# Patient Record
Sex: Male | Born: 1960 | Race: Black or African American | Hispanic: No | Smoking: Current some day smoker
Health system: Southern US, Community
[De-identification: ages and names within clinical notes are randomized; demographics above are authoritative.]

## PROBLEM LIST (undated history)

## (undated) DIAGNOSIS — F419 Anxiety disorder, unspecified: Secondary | ICD-10-CM

## (undated) DIAGNOSIS — R51 Headache: Secondary | ICD-10-CM

## (undated) DIAGNOSIS — R569 Unspecified convulsions: Secondary | ICD-10-CM

## (undated) DIAGNOSIS — I10 Essential (primary) hypertension: Secondary | ICD-10-CM

## (undated) DIAGNOSIS — R7303 Prediabetes: Secondary | ICD-10-CM

## (undated) DIAGNOSIS — K219 Gastro-esophageal reflux disease without esophagitis: Secondary | ICD-10-CM

## (undated) DIAGNOSIS — R42 Dizziness and giddiness: Secondary | ICD-10-CM

## (undated) DIAGNOSIS — D329 Benign neoplasm of meninges, unspecified: Secondary | ICD-10-CM

## (undated) DIAGNOSIS — I639 Cerebral infarction, unspecified: Secondary | ICD-10-CM

## (undated) DIAGNOSIS — I700 Atherosclerosis of aorta: Secondary | ICD-10-CM

## (undated) DIAGNOSIS — N400 Enlarged prostate without lower urinary tract symptoms: Secondary | ICD-10-CM

## (undated) DIAGNOSIS — E119 Type 2 diabetes mellitus without complications: Secondary | ICD-10-CM

## (undated) DIAGNOSIS — D496 Neoplasm of unspecified behavior of brain: Secondary | ICD-10-CM

## (undated) DIAGNOSIS — E7800 Pure hypercholesterolemia, unspecified: Secondary | ICD-10-CM

## (undated) DIAGNOSIS — M75100 Unspecified rotator cuff tear or rupture of unspecified shoulder, not specified as traumatic: Secondary | ICD-10-CM

## (undated) HISTORY — PX: ESOPHAGOGASTRODUODENOSCOPY: SHX1529

## (undated) HISTORY — PX: OTHER SURGICAL HISTORY: SHX169

## (undated) HISTORY — PX: CATARACT EXTRACTION: SUR2

---
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
## 2002-08-25 ENCOUNTER — Emergency Department (HOSPITAL_COMMUNITY): Admission: EM | Admit: 2002-08-25 | Discharge: 2002-08-25 | Payer: Self-pay

## 2002-09-11 ENCOUNTER — Emergency Department (HOSPITAL_COMMUNITY): Admission: EM | Admit: 2002-09-11 | Discharge: 2002-09-11 | Payer: Self-pay | Admitting: Emergency Medicine

## 2002-12-17 ENCOUNTER — Emergency Department (HOSPITAL_COMMUNITY): Admission: EM | Admit: 2002-12-17 | Discharge: 2002-12-18 | Payer: Self-pay | Admitting: Emergency Medicine

## 2005-08-15 ENCOUNTER — Inpatient Hospital Stay (HOSPITAL_COMMUNITY): Admission: RE | Admit: 2005-08-15 | Discharge: 2005-08-17 | Payer: Self-pay | Admitting: Psychiatry

## 2005-08-16 ENCOUNTER — Ambulatory Visit: Payer: Self-pay | Admitting: Psychiatry

## 2005-11-03 ENCOUNTER — Emergency Department (HOSPITAL_COMMUNITY): Admission: EM | Admit: 2005-11-03 | Discharge: 2005-11-03 | Payer: Self-pay | Admitting: Emergency Medicine

## 2005-11-30 ENCOUNTER — Emergency Department (HOSPITAL_COMMUNITY): Admission: EM | Admit: 2005-11-30 | Discharge: 2005-11-30 | Payer: Self-pay | Admitting: Emergency Medicine

## 2006-06-07 ENCOUNTER — Emergency Department: Payer: Self-pay | Admitting: Emergency Medicine

## 2006-06-20 ENCOUNTER — Emergency Department: Payer: Self-pay | Admitting: Emergency Medicine

## 2007-04-24 ENCOUNTER — Emergency Department: Payer: Self-pay | Admitting: Emergency Medicine

## 2007-09-04 ENCOUNTER — Emergency Department: Payer: Self-pay | Admitting: Emergency Medicine

## 2007-11-23 ENCOUNTER — Emergency Department: Payer: Self-pay | Admitting: Emergency Medicine

## 2008-02-06 ENCOUNTER — Emergency Department: Payer: Self-pay | Admitting: Emergency Medicine

## 2008-02-14 ENCOUNTER — Emergency Department: Payer: Self-pay

## 2008-03-30 ENCOUNTER — Emergency Department: Payer: Self-pay | Admitting: Unknown Physician Specialty

## 2012-09-21 ENCOUNTER — Emergency Department (HOSPITAL_COMMUNITY): Payer: 59

## 2012-09-21 ENCOUNTER — Emergency Department (HOSPITAL_COMMUNITY)
Admission: EM | Admit: 2012-09-21 | Discharge: 2012-09-21 | Disposition: A | Discharge status: Home / Self Care | Payer: 59 | Attending: Emergency Medicine | Admitting: Emergency Medicine

## 2012-09-21 ENCOUNTER — Encounter (HOSPITAL_COMMUNITY): Payer: Self-pay | Admitting: *Deleted

## 2012-09-21 DIAGNOSIS — Z79899 Other long term (current) drug therapy: Secondary | ICD-10-CM | POA: Insufficient documentation

## 2012-09-21 DIAGNOSIS — D320 Benign neoplasm of cerebral meninges: Secondary | ICD-10-CM | POA: Insufficient documentation

## 2012-09-21 DIAGNOSIS — F17200 Nicotine dependence, unspecified, uncomplicated: Secondary | ICD-10-CM | POA: Insufficient documentation

## 2012-09-21 DIAGNOSIS — H5710 Ocular pain, unspecified eye: Secondary | ICD-10-CM | POA: Insufficient documentation

## 2012-09-21 DIAGNOSIS — D496 Neoplasm of unspecified behavior of brain: Secondary | ICD-10-CM

## 2012-09-21 DIAGNOSIS — Z7982 Long term (current) use of aspirin: Secondary | ICD-10-CM | POA: Insufficient documentation

## 2012-09-21 DIAGNOSIS — Z9849 Cataract extraction status, unspecified eye: Secondary | ICD-10-CM | POA: Insufficient documentation

## 2012-09-21 DIAGNOSIS — R51 Headache: Secondary | ICD-10-CM | POA: Insufficient documentation

## 2012-09-21 MED ORDER — METOCLOPRAMIDE HCL 5 MG/ML IJ SOLN
10.0000 mg | Freq: Once | INTRAMUSCULAR | Status: AC
  Administered 2012-09-21: 10 mg via INTRAMUSCULAR
  Filled 2012-09-21: qty 2

## 2012-09-21 MED ORDER — GADOBENATE DIMEGLUMINE 529 MG/ML IV SOLN
20.0000 mL | Freq: Once | INTRAVENOUS | Status: AC | PRN
  Administered 2012-09-21: 20 mL via INTRAVENOUS

## 2012-09-21 MED ORDER — METOCLOPRAMIDE HCL 10 MG PO TABS: 10.0000 mg | ORAL_TABLET | Freq: Three times a day (TID) | ORAL | Status: DC | PRN

## 2012-09-21 MED ORDER — NAPROXEN 500 MG PO TABS: 500.0000 mg | ORAL_TABLET | Freq: Two times a day (BID) | ORAL | Status: DC

## 2012-09-21 MED ORDER — DIPHENHYDRAMINE HCL 25 MG PO CAPS
25.0000 mg | ORAL_CAPSULE | Freq: Once | ORAL | Status: AC
  Administered 2012-09-21: 25 mg via ORAL
  Filled 2012-09-21: qty 1

## 2012-09-21 MED ORDER — KETOROLAC TROMETHAMINE 60 MG/2ML IM SOLN
60.0000 mg | Freq: Once | INTRAMUSCULAR | Status: AC
  Administered 2012-09-21: 60 mg via INTRAMUSCULAR
  Filled 2012-09-21: qty 2

## 2012-09-21 NOTE — ED Notes (Signed)
Pt to ED c/o numbness around R eye orbit and R temple x 1 month .  Numbness usu lasts an hour and during that time he will experience intermittent stabbing pain to R eye.  Pt has hx of cataract removal to R eye and had an eye exam yesterday at which he was told nothing was wrong.  Pt states numbness is increasing.

## 2012-09-21 NOTE — ED Provider Notes (Signed)
History     CSN: [id number]  Arrival date & time 09/21/12  1214   First MD Initiated Contact with Patient 09/21/12 1235      Chief Complaint  Patient presents with  . Eye Pain  . Headache    (Consider location/radiation/quality/duration/timing/severity/associated sxs/prior treatment) HPI Comments: 52 year old male who has no prior medical history presents with right eye pain over the last month. He states that this pain has been present intermittently until approximately one month ago, since that time has been relatively persistent, nothing seems to make it better or worse including bright lights, loud sounds, position and it is not associated with any weakness, difficulty speaking, change in vision, balance problems, fevers, chills, stiff neck. He does have occasional numbness to the temple on the right side. Yesterday he was seen at his ophthalmologist office and had an eye exam thinking that the pain was coming from his eye. 2 years ago he had a cataract with a lens replacement, he was given a clear from his ophthalmologist yesterday stating that there was no problems with his eye. The patient describes the pain as a sharp and stabbing lancinating pain that comes from behind his right eye, associated with some redness of the eye and tearing of the eye that occurs occasionally. He has tried ibuprofen which helps his pain temporarily but then it comes back. He denies any changes in his vision.  Patient is a 52 y.o. male presenting with eye pain and headaches. The history is provided by the patient and a friend.  Eye Pain Associated symptoms include headaches.  Headache Associated symptoms: eye pain     History reviewed. No pertinent past medical history.  Past Surgical History  Procedure Laterality Date  . Cataract extraction      No family history on file.  History  Substance Use Topics  . Smoking status: Current Some Day Smoker    Types: Cigarettes  . Smokeless tobacco: Not on  file  . Alcohol Use: Yes     Comment: occasionally      Review of Systems  Eyes: Positive for pain.  Neurological: Positive for headaches.  All other systems reviewed and are negative.    Allergies  Review of patient's allergies indicates no known allergies.  Home Medications   Current Outpatient Rx  Name  Route  Sig  Dispense  Refill  . aspirin-acetaminophen-caffeine (EXCEDRIN MIGRAINE) 250-250-65 MG per tablet   Oral   Take 1 tablet by mouth every 6 (six) hours as needed (for headache).         Marland Kitchen ibuprofen (ADVIL,MOTRIN) 200 MG tablet   Oral   Take 200 mg by mouth every 6 (six) hours as needed for headache.         . polyvinyl alcohol (LIQUIFILM TEARS) 1.4 % ophthalmic solution   Right Eye   Place 1 drop into the right eye as needed (for redness).         . metoCLOPramide (REGLAN) 10 MG tablet   Oral   Take 1 tablet (10 mg total) by mouth 3 (three) times daily as needed (headache / nausea).   20 tablet   0   . naproxen (NAPROSYN) 500 MG tablet   Oral   Take 1 tablet (500 mg total) by mouth 2 (two) times daily with a meal.   30 tablet   0     BP 117/72  Pulse 55  Temp(Src) 99.1 F (37.3 C) (Oral)  Resp 16  SpO2 98%  Physical  Exam  Nursing note and vitals reviewed. Constitutional: He appears well-developed and well-nourished. No distress.  HENT:  Head: Normocephalic and atraumatic.  Mouth/Throat: Oropharynx is clear and moist. No oropharyngeal exudate.  No tenderness over the sinuses, nasal passages are clear, oropharynx is clear  Eyes: Conjunctivae and EOM are normal. Pupils are equal, round, and reactive to light. Right eye exhibits no discharge. Left eye exhibits no discharge. No scleral icterus.  Object is seen bilaterally, clear conjunctiva, normal appearing pupils and cornea grossly  Neck: Normal range of motion. Neck supple. No JVD present. No thyromegaly present.  Cardiovascular: Normal rate, regular rhythm, normal heart sounds and intact  distal pulses.  Exam reveals no gallop and no friction rub.   No murmur heard. Pulmonary/Chest: Effort normal and breath sounds normal. No respiratory distress. He has no wheezes. He has no rales.  Abdominal: Soft. Bowel sounds are normal. He exhibits no distension and no mass. There is no tenderness.  Musculoskeletal: Normal range of motion. He exhibits no edema and no tenderness.  Lymphadenopathy:    He has no cervical adenopathy.  Neurological: He is alert. Coordination normal.  The patient has normal speech, normal memory, normal coordination of all 4 extremities without any limb ataxia. The patient has normal strength of all 4 extremities at the major muscle groups including shoulders, biceps, triceps, forearm, grips, quadriceps, hamstrings, calves. Normal sensation to light touch and pinprick of all 4 extremities and the trunk. No truncal ataxia, normal gait, cranial nerves III through XII are intact.  Normal peripheral visual fields. Normal extraocular movements. Normal pupillary exam. No pronator drift, normal reflexes at the brachial radialis, biceps and patellar tendons. Normal position sense, normal temperature sensation to cold and warm  Skin: Skin is warm and dry. No rash noted. No erythema.  Psychiatric: He has a normal mood and affect. His behavior is normal.    ED Course  Procedures (including critical care time)  Labs Reviewed - No data to display Ct Head Wo Contrast  09/21/2012  *RADIOLOGY REPORT*  Clinical Data: Severe headache.  Pain behind the right eye.  CT HEAD WITHOUT CONTRAST  Technique:  Contiguous axial images were obtained from the base of the skull through the vertex without contrast.  Comparison: No priors.  Findings: In the cerebral vertex just to the right of the falx cerebri in the more adjacent to the posterior right frontal cortex there is a mass-like area where there is loss of the normal cortical sulcation and subtle right to left shift of the falx. This region  appears slightly hyperdense, although not as hyperdense as would be seen with hemorrhage.  No definite low attenuation in the adjacent brain parenchyma to suggest edema.  The appearance is highly suspicious for a mass, and given the location, this is favored to represent a meningioma, however, whether or not this lesion is intra or extra-axial is uncertain on these noncontrast CT images.  The remainder the brain parenchyma is otherwise unremarkable in appearance.  Specifically, no definite signs of acute intracranial hemorrhage or definite regions to suggest acute/subacute cerebral ischemia.  No hydrocephalous. No acute displaced skull fractures are identified.  Visualized paranasal sinuses and mastoids are well pneumatized.  IMPRESSION: 1.  Findings, as above, suspicious for a mass in the vertex immediately to the right of the falx cerebri.  This is favored to be an extra-axial lesion, likely a meningioma, however, this does warrant further evaluation with brain MRI with without gadolinium.  These results were called by telephone  on 09/21/2012 at 01:42 p.m. to Dr. Hyacinth Meeker, who verbally acknowledged these results.   Original Report Authenticated By: Trudie Reed, M.D.    Mr Laqueta Jean Wo Contrast  09/21/2012  *RADIOLOGY REPORT*  Clinical Data: Eye pain and headache for 3 weeks.  MRI HEAD WITHOUT AND WITH CONTRAST  Technique:  Multiplanar, multiecho pulse sequences of the brain and surrounding structures were obtained according to standard protocol without and with intravenous contrast  Contrast: 20mL MULTIHANCE GADOBENATE DIMEGLUMINE 529 MG/ML IV SOLN  Comparison: 09/21/2012 CT.  Findings: No acute infarct.  No intracranial hemorrhage.  Right parafalcine posterior medial right frontal - parietal lobe extra-axial appearing 3.2 x 2.3 x 2.9 cm mass suspicious for a meningioma.  Series 8 image 9 raises possibility of extension through the falx with slight impression upon the superior sagittal sinus which remains  patent. Mass is at the level of the medial aspect of the right motor strip and sensory strip.  Compression of adjacent gyri but without significant vasogenic edema.  No other intracranial enhancing lesion.  Small pituitary gland.  Mild degenerative changes of the cervical spine.  Major intracranial vascular structures appear patent with small vertebral arteries and basilar artery.  Paranasal sinus mild mucosal thickening.  Artifact extends from dental work.  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      1. Brain tumor   2. Headache       MDM  The patient presents with signs and symptoms consistent with a cluster headache. At this time he does not have any objective numbness around his eye, he has normal extraocular movements, normal orbit and a normal neurologic exam. Due to his age I think it's important to further evaluate for other sources of a headache since he has not had a history of headaches in the past. CT scan pending, pain medication ordered, will reevaluate.   CT shows abnormal likely mass, MRI ordered and confirms meningioma - d/w Dr. Venetia Maxon who requests f/u on Monday - pt totally pain free after toradol and reglan, informed of results, stable for d/c.     Vida Roller, MD 09/21/12 [phone]

## 2012-09-27 ENCOUNTER — Other Ambulatory Visit: Payer: Self-pay | Admitting: Neurosurgery

## 2012-09-27 DIAGNOSIS — D320 Benign neoplasm of cerebral meninges: Secondary | ICD-10-CM

## 2012-10-01 ENCOUNTER — Ambulatory Visit
Admission: RE | Admit: 2012-10-01 | Discharge: 2012-10-01 | Disposition: A | Discharge status: Home / Self Care | Payer: 59 | Source: Ambulatory Visit | Attending: Neurosurgery | Admitting: Neurosurgery

## 2012-10-01 ENCOUNTER — Other Ambulatory Visit: Payer: 59

## 2012-10-01 DIAGNOSIS — D320 Benign neoplasm of cerebral meninges: Secondary | ICD-10-CM

## 2012-10-01 MED ORDER — IOHEXOL 300 MG/ML  SOLN
75.0000 mL | Freq: Once | INTRAMUSCULAR | Status: AC | PRN
  Administered 2012-10-01: 75 mL via INTRAVENOUS

## 2012-10-11 ENCOUNTER — Ambulatory Visit: Payer: Self-pay | Admitting: Adult Health

## 2012-10-22 ENCOUNTER — Encounter (HOSPITAL_COMMUNITY): Payer: Self-pay | Admitting: Pharmacy Technician

## 2012-10-29 NOTE — Pre-Procedure Instructions (Signed)
Joshua [name]  10/29/2012   Your procedure is scheduled on:  Tuesday, April 22nd   Report to [facility] at 5:30 AM.             Take [name] to the third (3rd) Floor  Call this number if you have problems the morning of surgery: [phone]   Remember:   Do not eat food or drink liquids after midnight Monday.   Take these medicines the morning of surgery with A SIP OF WATER: Reglan (if needed)   Do not wear jewelry.  Do not wear lotions, powders, or colognes. You may NOT wear deodorant.   Men may shave face and neck.   Do not bring valuables to the hospital.  Contacts, dentures or bridgework may not be worn into surgery.   Leave suitcase in the car. After surgery it may be brought to your room.  For patients admitted to the hospital, checkout time is 11:00 AM the day of discharge.    Name and phone number of your driver:    Special Instructions: Shower using CHG 2 nights before surgery and the night before surgery.  If you shower the day of surgery use CHG.  Use special wash - you have one bottle of CHG for all showers.  You should use approximately 1/3 of the bottle for each shower.   Please read over the following fact sheets that you were given: Pain Booklet, Blood Transfusion Information, MRSA Information and Surgical Site Infection Prevention

## 2012-11-01 ENCOUNTER — Encounter (HOSPITAL_COMMUNITY)
Admission: RE | Admit: 2012-11-01 | Discharge: 2012-11-01 | Disposition: A | Discharge status: Home / Self Care | Payer: 59 | Source: Ambulatory Visit | Attending: Neurosurgery | Admitting: Neurosurgery

## 2012-11-01 ENCOUNTER — Encounter (HOSPITAL_COMMUNITY): Payer: Self-pay

## 2012-11-01 DIAGNOSIS — Z01812 Encounter for preprocedural laboratory examination: Secondary | ICD-10-CM | POA: Insufficient documentation

## 2012-11-01 HISTORY — DX: Headache: R51

## 2012-11-01 HISTORY — DX: Dizziness and giddiness: R42

## 2012-11-01 HISTORY — DX: Benign neoplasm of meninges, unspecified: D32.9

## 2012-11-01 HISTORY — DX: Benign prostatic hyperplasia without lower urinary tract symptoms: N40.0

## 2012-11-01 LAB — CBC
HCT: 42.9 % (ref 39.0–52.0)
Hemoglobin: 15.3 g/dL (ref 13.0–17.0)
MCH: 31.5 pg (ref 26.0–34.0)
MCHC: 35.7 g/dL (ref 30.0–36.0)
MCV: 88.3 fL (ref 78.0–100.0)
Platelets: 227 10*3/uL (ref 150–400)
RBC: 4.86 MIL/uL (ref 4.22–5.81)
RDW: 12.4 % (ref 11.5–15.5)
WBC: 7.4 10*3/uL (ref 4.0–10.5)

## 2012-11-01 LAB — TYPE AND SCREEN
ABO/RH(D): A POS
Antibody Screen: NEGATIVE

## 2012-11-01 LAB — SURGICAL PCR SCREEN
MRSA, PCR: NEGATIVE
Staphylococcus aureus: NEGATIVE

## 2012-11-01 LAB — ABO/RH: ABO/RH(D): A POS

## 2012-11-01 NOTE — Progress Notes (Signed)
Per [facility] no Stress Test has been done

## 2012-11-01 NOTE — Pre-Procedure Instructions (Signed)
Joshua [name]  11/01/2012   Your procedure is scheduled on:  Tues, April 22 @ 7:30 AM  Report to [facility] at 5:30 AM.  Call this number if you have problems the morning of surgery: [phone]   Remember:   Do not eat food or drink liquids after midnight.   Take these medicines the morning of surgery with A SIP OF WATER: Metoclopramide(Reglan-if needed)               Stop taking your Ibuprofen and Aleve.No Goody's,BC's,Fish Oil,or any Herbal Medications   Do not wear jewelry  Do not wear lotions, powders, or colognes. You may wear deodorant.  Men may shave face and neck.  Do not bring valuables to the hospital.  Contacts, dentures or bridgework may not be worn into surgery.  Leave suitcase in the car. After surgery it may be brought to your room.  For patients admitted to the hospital, checkout time is 11:00 AM the day of  discharge.   Patients discharged the day of surgery will not be allowed to drive  home.    Special Instructions: Shower using CHG 2 nights before surgery and the night before surgery.  If you shower the day of surgery use CHG.  Use special wash - you have one bottle of CHG for all showers.  You should use approximately 1/3 of the bottle for each shower.   Please read over the following fact sheets that you were given: Pain Booklet, Coughing and Deep Breathing, Blood Transfusion Information, MRSA Information and Surgical Site Infection Prevention

## 2012-11-01 NOTE — Progress Notes (Addendum)
Pt doesn't have a cardiologist  Stress test done about 50yrs ago-done @ [facility] in [place]-was just done as a physical no problems and stress test was normal-requested  Denies ever having an echo/heart cath  Pt doesn't have a medical md  Denies ekg or cxr in past yr

## 2012-11-05 MED ORDER — CEFAZOLIN SODIUM-DEXTROSE 2-3 GM-% IV SOLR: 2.0000 g | INTRAVENOUS | Status: DC

## 2012-11-05 MED ORDER — CEFAZOLIN SODIUM-DEXTROSE 2-3 GM-% IV SOLR
2.0000 g | INTRAVENOUS | Status: AC
  Administered 2012-11-06: 2 g via INTRAVENOUS
  Filled 2012-11-05: qty 50

## 2012-11-06 ENCOUNTER — Encounter (HOSPITAL_COMMUNITY): Payer: Self-pay | Admitting: *Deleted

## 2012-11-06 ENCOUNTER — Inpatient Hospital Stay (HOSPITAL_COMMUNITY): Payer: 59 | Admitting: Anesthesiology

## 2012-11-06 ENCOUNTER — Encounter (HOSPITAL_COMMUNITY): Payer: Self-pay | Admitting: Anesthesiology

## 2012-11-06 ENCOUNTER — Inpatient Hospital Stay (HOSPITAL_COMMUNITY)
Admission: RE | Admit: 2012-11-06 | Discharge: 2012-11-08 | DRG: 027 | Disposition: A | Discharge status: Home / Self Care | Payer: 59 | Source: Ambulatory Visit | Attending: Neurosurgery | Admitting: Neurosurgery

## 2012-11-06 ENCOUNTER — Encounter (HOSPITAL_COMMUNITY)
Admission: RE | Disposition: A | Discharge status: Home / Self Care | Payer: Self-pay | Source: Ambulatory Visit | Attending: Neurosurgery

## 2012-11-06 DIAGNOSIS — G44009 Cluster headache syndrome, unspecified, not intractable: Secondary | ICD-10-CM | POA: Diagnosis present

## 2012-11-06 DIAGNOSIS — D320 Benign neoplasm of cerebral meninges: Principal | ICD-10-CM | POA: Diagnosis present

## 2012-11-06 DIAGNOSIS — F17200 Nicotine dependence, unspecified, uncomplicated: Secondary | ICD-10-CM | POA: Diagnosis present

## 2012-11-06 HISTORY — PX: CRANIOTOMY: SHX93

## 2012-11-06 SURGERY — CRANIOTOMY TUMOR EXCISION
Anesthesia: General | Laterality: Right | Wound class: Clean

## 2012-11-06 MED ORDER — LABETALOL HCL 5 MG/ML IV SOLN: 10.0000 mg | INTRAVENOUS | Status: DC | PRN

## 2012-11-06 MED ORDER — LACTATED RINGERS IV SOLN
INTRAVENOUS | Status: DC | PRN
  Administered 2012-11-06: 07:00:00 via INTRAVENOUS

## 2012-11-06 MED ORDER — MORPHINE SULFATE 2 MG/ML IJ SOLN
1.0000 mg | INTRAMUSCULAR | Status: DC | PRN
  Administered 2012-11-06 (×2): 2 mg via INTRAVENOUS
  Filled 2012-11-06 (×2): qty 1

## 2012-11-06 MED ORDER — METOCLOPRAMIDE HCL 10 MG PO TABS
10.0000 mg | ORAL_TABLET | Freq: Three times a day (TID) | ORAL | Status: DC | PRN
Start: 2012-11-06 — End: 2012-11-08
  Filled 2012-11-06: qty 1

## 2012-11-06 MED ORDER — BUPIVACAINE HCL (PF) 0.5 % IJ SOLN
INTRAMUSCULAR | Status: DC | PRN
  Administered 2012-11-06: 5.5 mL

## 2012-11-06 MED ORDER — DEXAMETHASONE SODIUM PHOSPHATE 10 MG/ML IJ SOLN
6.0000 mg | Freq: Four times a day (QID) | INTRAMUSCULAR | Status: AC
  Administered 2012-11-06 – 2012-11-07 (×4): 6 mg via INTRAVENOUS
  Filled 2012-11-06 (×2): qty 1

## 2012-11-06 MED ORDER — CEFAZOLIN SODIUM-DEXTROSE 2-3 GM-% IV SOLR: 2.0000 g | Freq: Three times a day (TID) | INTRAVENOUS | Status: DC

## 2012-11-06 MED ORDER — BACITRACIN ZINC 500 UNIT/GM EX OINT
TOPICAL_OINTMENT | CUTANEOUS | Status: DC | PRN
  Administered 2012-11-06: 1 via TOPICAL

## 2012-11-06 MED ORDER — LIDOCAINE HCL (CARDIAC) 20 MG/ML IV SOLN
INTRAVENOUS | Status: DC | PRN
  Administered 2012-11-06: 50 mg via INTRAVENOUS

## 2012-11-06 MED ORDER — DOCUSATE SODIUM 100 MG PO CAPS
100.0000 mg | ORAL_CAPSULE | Freq: Two times a day (BID) | ORAL | Status: DC
  Administered 2012-11-06 – 2012-11-08 (×4): 100 mg via ORAL
  Filled 2012-11-06 (×5): qty 1

## 2012-11-06 MED ORDER — CEFAZOLIN SODIUM 1-5 GM-% IV SOLN
1.0000 g | Freq: Three times a day (TID) | INTRAVENOUS | Status: AC
  Administered 2012-11-06 (×2): 1 g via INTRAVENOUS
  Filled 2012-11-06 (×2): qty 50

## 2012-11-06 MED ORDER — LIDOCAINE-EPINEPHRINE 1 %-1:100000 IJ SOLN
INTRAMUSCULAR | Status: DC | PRN
  Administered 2012-11-06: 5.5 mL via INTRADERMAL

## 2012-11-06 MED ORDER — DEXAMETHASONE SODIUM PHOSPHATE 4 MG/ML IJ SOLN
4.0000 mg | Freq: Four times a day (QID) | INTRAMUSCULAR | Status: AC
  Administered 2012-11-07 – 2012-11-08 (×4): 4 mg via INTRAVENOUS
  Filled 2012-11-06 (×3): qty 1
  Filled 2012-11-06: qty 2
  Filled 2012-11-06 (×4): qty 1

## 2012-11-06 MED ORDER — ACETAMINOPHEN 650 MG RE SUPP: 650.0000 mg | RECTAL | Status: DC | PRN

## 2012-11-06 MED ORDER — HYDROCODONE-ACETAMINOPHEN 5-325 MG PO TABS
1.0000 | ORAL_TABLET | ORAL | Status: DC | PRN
  Administered 2012-11-06 – 2012-11-08 (×6): 1 via ORAL
  Filled 2012-11-06 (×6): qty 1

## 2012-11-06 MED ORDER — GLYCOPYRROLATE 0.2 MG/ML IJ SOLN
INTRAMUSCULAR | Status: DC | PRN
  Administered 2012-11-06 (×2): 0.4 mg via INTRAVENOUS

## 2012-11-06 MED ORDER — HYDROMORPHONE HCL PF 1 MG/ML IJ SOLN
INTRAMUSCULAR | Status: AC
  Administered 2012-11-06: 0.5 mg via INTRAVENOUS
  Filled 2012-11-06: qty 1

## 2012-11-06 MED ORDER — ACETAMINOPHEN 325 MG PO TABS: 650.0000 mg | ORAL_TABLET | ORAL | Status: DC | PRN

## 2012-11-06 MED ORDER — ONDANSETRON HCL 4 MG/2ML IJ SOLN: 4.0000 mg | INTRAMUSCULAR | Status: DC | PRN

## 2012-11-06 MED ORDER — PROPOFOL 10 MG/ML IV BOLUS
INTRAVENOUS | Status: DC | PRN
  Administered 2012-11-06: 200 mg via INTRAVENOUS
  Administered 2012-11-06: 20 mg via INTRAVENOUS
  Administered 2012-11-06: 50 mg via INTRAVENOUS
  Administered 2012-11-06: 30 mg via INTRAVENOUS
  Administered 2012-11-06: 50 mg via INTRAVENOUS

## 2012-11-06 MED ORDER — SENNA 8.6 MG PO TABS
1.0000 | ORAL_TABLET | Freq: Two times a day (BID) | ORAL | Status: DC
  Administered 2012-11-06 – 2012-11-08 (×4): 8.6 mg via ORAL
  Filled 2012-11-06 (×6): qty 1

## 2012-11-06 MED ORDER — ONDANSETRON HCL 4 MG/2ML IJ SOLN
INTRAMUSCULAR | Status: DC | PRN
  Administered 2012-11-06: 4 mg via INTRAVENOUS

## 2012-11-06 MED ORDER — PROMETHAZINE HCL 25 MG PO TABS: 12.5000 mg | ORAL_TABLET | ORAL | Status: DC | PRN

## 2012-11-06 MED ORDER — MICROFIBRILLAR COLL HEMOSTAT EX PADS
MEDICATED_PAD | CUTANEOUS | Status: DC | PRN
  Administered 2012-11-06: 1 via TOPICAL

## 2012-11-06 MED ORDER — PHENYLEPHRINE HCL 10 MG/ML IJ SOLN
INTRAMUSCULAR | Status: DC | PRN
  Administered 2012-11-06: 40 ug via INTRAVENOUS
  Administered 2012-11-06: 20 ug via INTRAVENOUS

## 2012-11-06 MED ORDER — ROCURONIUM BROMIDE 100 MG/10ML IV SOLN
INTRAVENOUS | Status: DC | PRN
  Administered 2012-11-06 (×2): 30 mg via INTRAVENOUS
  Administered 2012-11-06: 20 mg via INTRAVENOUS

## 2012-11-06 MED ORDER — DEXAMETHASONE SODIUM PHOSPHATE 10 MG/ML IJ SOLN
INTRAMUSCULAR | Status: DC | PRN
  Administered 2012-11-06: 10 mg via INTRAVENOUS

## 2012-11-06 MED ORDER — LACTATED RINGERS IV SOLN
INTRAVENOUS | Status: DC | PRN
  Administered 2012-11-06 (×2): via INTRAVENOUS

## 2012-11-06 MED ORDER — POLYETHYLENE GLYCOL 3350 17 G PO PACK
17.0000 g | PACK | Freq: Every day | ORAL | Status: DC | PRN
  Filled 2012-11-06: qty 1

## 2012-11-06 MED ORDER — BISACODYL 10 MG RE SUPP: 10.0000 mg | Freq: Every day | RECTAL | Status: DC | PRN

## 2012-11-06 MED ORDER — MORPHINE SULFATE 2 MG/ML IJ SOLN
2.0000 mg | INTRAMUSCULAR | Status: DC | PRN
  Administered 2012-11-06: 4 mg via INTRAVENOUS
  Administered 2012-11-06: 2 mg via INTRAVENOUS
  Filled 2012-11-06: qty 1
  Filled 2012-11-06: qty 2

## 2012-11-06 MED ORDER — DEXTROSE 5 % IV SOLN
INTRAVENOUS | Status: DC | PRN
  Administered 2012-11-06: 08:00:00 via INTRAVENOUS

## 2012-11-06 MED ORDER — WHITE PETROLATUM GEL
Status: AC
  Administered 2012-11-06: 1
  Filled 2012-11-06: qty 5

## 2012-11-06 MED ORDER — HEMOSTATIC AGENTS (NO CHARGE) OPTIME
TOPICAL | Status: DC | PRN
  Administered 2012-11-06: 1 via TOPICAL

## 2012-11-06 MED ORDER — DEXAMETHASONE SODIUM PHOSPHATE 4 MG/ML IJ SOLN
4.0000 mg | Freq: Three times a day (TID) | INTRAMUSCULAR | Status: DC
  Filled 2012-11-06 (×3): qty 1

## 2012-11-06 MED ORDER — SODIUM CHLORIDE 0.9 % IV SOLN
500.0000 mg | Freq: Two times a day (BID) | INTRAVENOUS | Status: DC
  Administered 2012-11-06 – 2012-11-07 (×3): 500 mg via INTRAVENOUS
  Filled 2012-11-06 (×5): qty 5

## 2012-11-06 MED ORDER — ONDANSETRON HCL 4 MG PO TABS: 4.0000 mg | ORAL_TABLET | ORAL | Status: DC | PRN

## 2012-11-06 MED ORDER — HYDROMORPHONE HCL PF 1 MG/ML IJ SOLN
0.2500 mg | INTRAMUSCULAR | Status: DC | PRN
  Administered 2012-11-06 (×2): 0.5 mg via INTRAVENOUS

## 2012-11-06 MED ORDER — FENTANYL CITRATE 0.05 MG/ML IJ SOLN
INTRAMUSCULAR | Status: DC | PRN
  Administered 2012-11-06: 100 ug via INTRAVENOUS
  Administered 2012-11-06 (×2): 50 ug via INTRAVENOUS
  Administered 2012-11-06: 100 ug via INTRAVENOUS
  Administered 2012-11-06: 50 ug via INTRAVENOUS
  Administered 2012-11-06: 100 ug via INTRAVENOUS
  Administered 2012-11-06: 50 ug via INTRAVENOUS

## 2012-11-06 MED ORDER — NEOSTIGMINE METHYLSULFATE 1 MG/ML IJ SOLN
INTRAMUSCULAR | Status: DC | PRN
  Administered 2012-11-06: 2 mg via INTRAVENOUS
  Administered 2012-11-06: 3 mg via INTRAVENOUS

## 2012-11-06 MED ORDER — SODIUM CHLORIDE 0.9 % IR SOLN
Status: DC | PRN
  Administered 2012-11-06 (×2): 1000 mL

## 2012-11-06 MED ORDER — PANTOPRAZOLE SODIUM 40 MG IV SOLR
40.0000 mg | Freq: Every day | INTRAVENOUS | Status: DC
  Administered 2012-11-06: 40 mg via INTRAVENOUS
  Filled 2012-11-06 (×2): qty 40

## 2012-11-06 MED ORDER — POTASSIUM CHLORIDE IN NACL 20-0.9 MEQ/L-% IV SOLN
INTRAVENOUS | Status: DC
  Administered 2012-11-06 – 2012-11-07 (×2): via INTRAVENOUS
  Filled 2012-11-06 (×4): qty 1000

## 2012-11-06 MED ORDER — THROMBIN 20000 UNITS EX KIT
PACK | CUTANEOUS | Status: DC | PRN
  Administered 2012-11-06: 20000 [IU] via TOPICAL

## 2012-11-06 MED ORDER — MIDAZOLAM HCL 5 MG/5ML IJ SOLN
INTRAMUSCULAR | Status: DC | PRN
  Administered 2012-11-06: 1 mg via INTRAVENOUS

## 2012-11-06 MED ORDER — FLEET ENEMA 7-19 GM/118ML RE ENEM
1.0000 | ENEMA | Freq: Once | RECTAL | Status: AC | PRN
  Filled 2012-11-06: qty 1

## 2012-11-06 SURGICAL SUPPLY — 86 items
BANDAGE GAUZE 4  KLING STR (GAUZE/BANDAGES/DRESSINGS) IMPLANT
BIT DRILL WIRE PASS 1.3MM (BIT) IMPLANT
BLADE SURG ROTATE 9660 (MISCELLANEOUS) IMPLANT
BRUSH SCRUB EZ 1% IODOPHOR (MISCELLANEOUS) IMPLANT
BRUSH SCRUB EZ PLAIN DRY (MISCELLANEOUS) ×2 IMPLANT
BUR ACORN 6.0 PRECISION (BURR) ×2 IMPLANT
BUR ADDG 1.1 (BURR) IMPLANT
BUR ROUTER D-58 CRANI (BURR) ×2 IMPLANT
CANISTER SUCTION 2500CC (MISCELLANEOUS) ×2 IMPLANT
CLIP TI MEDIUM 6 (CLIP) IMPLANT
CLOTH BEACON ORANGE TIMEOUT ST (SAFETY) ×2 IMPLANT
CONT SPEC 4OZ CLIKSEAL STRL BL (MISCELLANEOUS) ×6 IMPLANT
CORDS BIPOLAR (ELECTRODE) ×2 IMPLANT
COVER MAYO STAND STRL (DRAPES) IMPLANT
DRAIN SNY WOU 7FLT (WOUND CARE) IMPLANT
DRAPE MICROSCOPE LEICA (MISCELLANEOUS) ×2 IMPLANT
DRAPE NEUROLOGICAL W/INCISE (DRAPES) ×2 IMPLANT
DRAPE STERI IOBAN 125X83 (DRAPES) IMPLANT
DRAPE WARM FLUID 44X44 (DRAPE) ×2 IMPLANT
DRESSING TELFA 8X3 (GAUZE/BANDAGES/DRESSINGS) ×2 IMPLANT
DRILL WIRE PASS 1.3MM (BIT)
DRSG OPSITE 4X5.5 SM (GAUZE/BANDAGES/DRESSINGS) ×8 IMPLANT
DRSG TEGADERM 4X4.75 (GAUZE/BANDAGES/DRESSINGS) ×2 IMPLANT
DURAPREP 6ML APPLICATOR 50/CS (WOUND CARE) ×2 IMPLANT
ELECT REM PT RETURN 9FT ADLT (ELECTROSURGICAL) ×2
ELECTRODE REM PT RTRN 9FT ADLT (ELECTROSURGICAL) ×1 IMPLANT
EVACUATOR 1/8 PVC DRAIN (DRAIN) IMPLANT
EVACUATOR SILICONE 100CC (DRAIN) IMPLANT
FORCEPS BIPOLAR SPETZLER 8 1.0 (NEUROSURGERY SUPPLIES) ×2 IMPLANT
GAUZE SPONGE 4X4 16PLY XRAY LF (GAUZE/BANDAGES/DRESSINGS) IMPLANT
GLOVE BIO SURGEON STRL SZ8 (GLOVE) ×2 IMPLANT
GLOVE BIOGEL PI IND STRL 7.0 (GLOVE) ×1 IMPLANT
GLOVE BIOGEL PI IND STRL 7.5 (GLOVE) ×1 IMPLANT
GLOVE BIOGEL PI IND STRL 8 (GLOVE) ×1 IMPLANT
GLOVE BIOGEL PI IND STRL 8.5 (GLOVE) ×2 IMPLANT
GLOVE BIOGEL PI INDICATOR 7.0 (GLOVE) ×1
GLOVE BIOGEL PI INDICATOR 7.5 (GLOVE) ×1
GLOVE BIOGEL PI INDICATOR 8 (GLOVE) ×1
GLOVE BIOGEL PI INDICATOR 8.5 (GLOVE) ×2
GLOVE ECLIPSE 6.5 STRL STRAW (GLOVE) ×2 IMPLANT
GLOVE ECLIPSE 8.0 STRL XLNG CF (GLOVE) ×2 IMPLANT
GLOVE EXAM NITRILE LRG STRL (GLOVE) IMPLANT
GLOVE EXAM NITRILE MD LF STRL (GLOVE) ×4 IMPLANT
GLOVE EXAM NITRILE XL STR (GLOVE) IMPLANT
GLOVE EXAM NITRILE XS STR PU (GLOVE) IMPLANT
GLOVE SURG SS PI 7.0 STRL IVOR (GLOVE) ×8 IMPLANT
GOWN BRE IMP SLV AUR LG STRL (GOWN DISPOSABLE) IMPLANT
GOWN BRE IMP SLV AUR XL STRL (GOWN DISPOSABLE) ×6 IMPLANT
GOWN STRL REIN 2XL LVL4 (GOWN DISPOSABLE) ×2 IMPLANT
HEMOSTAT SURGICEL 2X14 (HEMOSTASIS) ×2 IMPLANT
HOOK DURA (MISCELLANEOUS) ×2 IMPLANT
KIT BASIN OR (CUSTOM PROCEDURE TRAY) ×2 IMPLANT
KIT ROOM TURNOVER OR (KITS) ×2 IMPLANT
MARKER SKIN DUAL TIP RULER LAB (MISCELLANEOUS) ×2 IMPLANT
NEEDLE HYPO 25X1 1.5 SAFETY (NEEDLE) ×2 IMPLANT
NS IRRIG 1000ML POUR BTL (IV SOLUTION) ×4 IMPLANT
PACK CRANIOTOMY (CUSTOM PROCEDURE TRAY) ×2 IMPLANT
PAD ARMBOARD 7.5X6 YLW CONV (MISCELLANEOUS) ×2 IMPLANT
PAD EYE OVAL STERILE LF (GAUZE/BANDAGES/DRESSINGS) IMPLANT
PATTIES SURGICAL .25X.25 (GAUZE/BANDAGES/DRESSINGS) IMPLANT
PATTIES SURGICAL .5 X.5 (GAUZE/BANDAGES/DRESSINGS) IMPLANT
PATTIES SURGICAL .5 X3 (DISPOSABLE) IMPLANT
PATTIES SURGICAL 1/4 X 3 (GAUZE/BANDAGES/DRESSINGS) IMPLANT
PATTIES SURGICAL 1X1 (DISPOSABLE) IMPLANT
PIN MAYFIELD SKULL DISP (PIN) ×2 IMPLANT
PLATE 1.5/0.5 13MM BURR HOLE (Plate) ×8 IMPLANT
RUBBERBAND STERILE (MISCELLANEOUS) ×4 IMPLANT
SCREW SELF DRILL HT 1.5/4MM (Screw) ×34 IMPLANT
SPECIMEN JAR SMALL (MISCELLANEOUS) IMPLANT
SPONGE GAUZE 4X4 12PLY (GAUZE/BANDAGES/DRESSINGS) IMPLANT
SPONGE NEURO XRAY DETECT 1X3 (DISPOSABLE) IMPLANT
SPONGE SURGIFOAM ABS GEL 100 (HEMOSTASIS) ×2 IMPLANT
STAPLER SKIN PROX WIDE 3.9 (STAPLE) ×4 IMPLANT
SUT ETHILON 3 0 FSL (SUTURE) IMPLANT
SUT NURALON 4 0 TR CR/8 (SUTURE) ×4 IMPLANT
SUT SILK 2 0 FS (SUTURE) IMPLANT
SUT VIC AB 2-0 CP2 18 (SUTURE) ×4 IMPLANT
SYR 20ML ECCENTRIC (SYRINGE) ×2 IMPLANT
SYR CONTROL 10ML LL (SYRINGE) ×2 IMPLANT
TIP SONASTAR STD MISONIX 1.9 (TRAY / TRAY PROCEDURE) IMPLANT
TOWEL OR 17X24 6PK STRL BLUE (TOWEL DISPOSABLE) ×2 IMPLANT
TOWEL OR 17X26 10 PK STRL BLUE (TOWEL DISPOSABLE) ×2 IMPLANT
TRAY FOLEY CATH 14FRSI W/METER (CATHETERS) ×2 IMPLANT
TUBE CONNECTING 12X1/4 (SUCTIONS) IMPLANT
UNDERPAD 30X30 INCONTINENT (UNDERPADS AND DIAPERS) IMPLANT
WATER STERILE IRR 1000ML POUR (IV SOLUTION) ×2 IMPLANT

## 2012-11-06 NOTE — Anesthesia Preprocedure Evaluation (Addendum)
Anesthesia Evaluation  Patient identified by MRN, date of birth, ID band Patient awake    Reviewed: Allergy & Precautions, H&P , NPO status , Patient's Chart, lab work & pertinent test results, reviewed documented beta blocker date and time   Airway Mallampati: I TM Distance: >3 FB Neck ROM: Full    Dental  (+) Teeth Intact, Missing, Partial Upper and Dental Advisory Given   Pulmonary Current Smoker,  breath sounds clear to auscultation        Cardiovascular Exercise Tolerance: Good negative cardio ROS  Rhythm:Regular Rate:Normal     Neuro/Psych  Headaches,    GI/Hepatic negative GI ROS, Neg liver ROS,   Endo/Other  negative endocrine ROS  Renal/GU negative Renal ROS     Musculoskeletal   Abdominal   Peds  Hematology   Anesthesia Other Findings   Reproductive/Obstetrics                         Anesthesia Physical Anesthesia Plan  ASA: III  Anesthesia Plan: General   Post-op Pain Management:    Induction: Intravenous  Airway Management Planned: Oral ETT  Additional Equipment:   Intra-op Plan:   Post-operative Plan: Possible Post-op intubation/ventilation  Informed Consent: I have reviewed the patients History and Physical, chart, labs and discussed the procedure including the risks, benefits and alternatives for the proposed anesthesia with the patient or authorized representative who has indicated his/her understanding and acceptance.   Dental advisory given  Plan Discussed with: CRNA, Anesthesiologist and Surgeon  Anesthesia Plan Comments:        Anesthesia Quick Evaluation

## 2012-11-06 NOTE — Interval H&P Note (Signed)
History and Physical Interval Note:  11/06/2012 6:29 AM  Joshua [name]  has presented today for surgery, with the diagnosis of Meningioma, Cluster headaches  The various methods of treatment have been discussed with the patient and family. After consideration of risks, benefits and other options for treatment, the patient has consented to  Procedure(s) with comments: CRANIOTOMY TUMOR EXCISION (Right) - Right Parasagittal craniotomy for meningioma/with Stealth as a surgical intervention .  The patient's history has been reviewed, patient examined, no change in status, stable for surgery.  I have reviewed the patient's chart and labs.  Questions were answered to the patient's satisfaction.     [name] D

## 2012-11-06 NOTE — H&P (Signed)
NEUROSURGICAL CONSULTATION   Joshua [name]   DOB:  October 27, 1960 #[id number]    September 25, 2012   HISTORY:     Joshua [name] is a 52 year old man who works as a Administrator, Civil Service at [employer] who comes in on referral through the emergency room at [facility] with right eye pain, which the emergency room physician, I believe, was correct in diagnosing as a cluster headache, as he describes tearing and retroorbital pain and describes this as quite intense and intermittent.  He previously saw an eye doctor who felt that his eye pain was that he had a significant cataract.  He had cataract extraction on the right, but this did not improve his eye pain.  The physician in the emergency room obtained an MRI of his brain at the time of his visit to the emergency room, which revealed an incidental right parasagittal meningioma of approximately 3 cm. in diameter with significant compression of the right motor strip, but without a lot of brain edema. The sagittal sinus appears to be patent.  This was based on a head CT which then led to the MRI scan. He denies any weakness on his left side. He does not occasional pain at the top of his head.  He is otherwise without significant complaints.    REVIEW OF SYSTEMS:   A detailed Review of Systems sheet was reviewed with the patient.  Pertinent positives include under eyes - he has cataracts, under neurologic - he notes facial weakness.  All other systems are negative; this includes Constitutional symptoms, Cardiovascular, Ears, nose, mouth, throat, Endocrine, Respiratory, Gastrointestinal, Genitourinary, Musculoskeletal, Integumentary & Breast, Psychiatric, Hematologic/Lymphatic, Allergic/Immunologic.    PAST MEDICAL HISTORY:      Current Medical Conditions:    As previously described above.      Prior Operations and Hospitalizations:   As previously described above.      Medications and Allergies:  His medications include Excedrin Migraine  250-250-65 mg. one every six hours as needed for headaches, Ibuprofen 200 mg. every six hours as needed for headache, Polyvinyl alcohol Liquifilm Tears in the right eye as needed for redness, Metoclopramide 10 mg. three times daily as needed for headache and nausea, Naprosyn 500 mg. twice daily as needed.      Height and Weight:     He is 5'10" tall, 185 lbs.    FAMILY HISTORY:    Both parents deceased, cause unspecified.    SOCIAL HISTORY:    He is a social drinker of alcoholic beverages and social tobacco smoker. He denies drug use.    PHYSICAL EXAMINATION:      General Appearance:   On examination today, Joshua [name] is a pleasant and cooperative man in no acute distress.    Blood Pressure, Pulse:     His blood pressure is 144/86.  Heart rate is 76 and regular.  Respiratory rate is 16.      HEENT - normocephalic, atraumatic.  The pupils are equal, round and reactive to light.  The extraocular muscles are intact.  Sclerae - white.  Conjunctiva - pink.  Oropharynx benign.  Uvula midline.     Neck - there are no masses, meningismus, deformities, tracheal deviation, jugular vein distention or carotid bruits.  There is normal cervical range of motion.  Spurlings' test is negative without reproducible radicular pain turning the patient's head to either side.  Lhermitte's sign is not present with axial compression.      Respiratory - there is normal  respiratory effort with good intercostal function.  Lungs are clear to auscultation.  There are no rales, rhonchi or wheezes.      Cardiovascular - the heart has regular rate and rhythm to auscultation.  No murmurs are appreciated.  There is no extremity edema, cyanosis or clubbing.  There are palpable pedal pulses.      Abdomen - soft, nontender, no hepatosplenomegaly appreciated or masses.  There are active bowel sounds.  No guarding or rebound.      Musculoskeletal Examination - the patient is able to walk about the examining room with a normal,  casual, heel and toe gait.    NEUROLOGICAL EXAMINATION: The patient is oriented to time, person and place and has good recall of both recent and remote memory with normal attention span and concentration.  The patient speaks with clear and fluent speech and exhibits normal language function and appropriate fund of knowledge.      Cranial Nerve Examination - pupils are equal, round and reactive to light.  Extraocular movements are full.  Visual fields are full to confrontational testing.  Facial sensation and facial movement are symmetric and intact.  Hearing is intact to finger rub.  Palate is upgoing.  Shoulder shrug is symmetric.  Tongue protrudes in the midline.      Motor Examination - motor strength is 5/5 in the bilateral deltoids, biceps, triceps, handgrips, wrist extensors, interosseous.  In the lower extremities motor strength is 5/5 in hip flexion, extension, quadriceps, hamstrings, plantar flexion, dorsiflexion and extensor hallucis longus.  No pronator drift.      Sensory Examination - normal to light touch and pinprick sensation in the upper and lower extremities.     Deep Tendon Reflexes - 2 in the biceps, triceps, and brachioradialis, 2 in the knees, 2 in the ankles.  The great toes are downgoing to plantar stimulation.      Cerebellar Examination - normal coordination in upper and lower extremities and normal rapid alternating movements.  Romberg test is negative.    IMPRESSION AND RECOMMENDATIONS: Joshua [name] is a 52 year old man with an incidentally noted 3 cm. right parasagittal meningioma which is causing significant compression of his right motor strip. I explained to him this is likely a benign tumor and is likely slow-growing, but that given its rather significant size and location I do think it should be removed either now or after some followup.  He says he wants to have it removed and does not want to wait on that.  The risks and benefits were discussed with the patient.   I also offered to send him to a neurologist for evaluation of cluster headaches.  He wants to do that after he has the craniotomy.  We will plan on Stealth CT of his brain for meningioma resection and we plan on surgery on 11/08/2012 for a right parasagittal craniotomy for resection of meningioma.  I went over risks and benefits with the patient in great detail and he wishes to proceed.    [facility]    Danae Orleans. Venetia Maxon, M.D.  JDS:aft

## 2012-11-06 NOTE — Op Note (Signed)
11/06/2012  9:46 AM  PATIENT:  Joshua [name]  52 y.o. male  PRE-OPERATIVE DIAGNOSIS:  Right Parasagittal Meningioma, Cluster headaches  POST-OPERATIVE DIAGNOSIS:  Right Parasagittal Meningioma, Cluster headaches  PROCEDURE:  Procedure(s) with comments: CRANIOTOMY TUMOR EXCISION (Right) - Right Parasagittal craniotomy for meningioma with Stealth  SURGEON:  Surgeon(s) and Role:    * Ameisha Mcclellan, MD - Primary    * Kyle L Cabbell, MD - Assisting  PHYSICIAN ASSISTANT:   ASSISTANTS: Poteat, RN   ANESTHESIA:   general  EBL:  Total I/O In: 1050 [I.V.:1050] Out: 500 [Urine:300; Blood:200]  BLOOD ADMINISTERED:none  DRAINS: none   LOCAL MEDICATIONS USED:  MARCAINE     SPECIMEN:  Excision  DISPOSITION OF SPECIMEN:  PATHOLOGY  COUNTS:  YES  TOURNIQUET:  * No tourniquets in log *  DICTATION: Patient is 52 year old man with right parasagittal meningioma 3 cm in diameter compressing his motor strip.  It was elected to take him to surgery for craniotomy for right parasagittal brain tumor.  He had a Stealth CT for surgical localization of tumor.  Procedure:  Following smooth intubation, patient was placed in supine position with brow up.  Head was placed in pins and parasagittal scalp was prepped and draped in usual sterile fashion after Stealth CTwas localized to map tumor location along with location of sagittal sinus.  Area of planned incision was infiltrated with lidocaine. A linear incision was made and carried through temporalis fascia and muscle to expose calvarium.  Skull flap was elevated exposing the dura directly overlying the brain mass after clearing bone across sagittal sinus with Kerrison and dissecting dura free from bone flap. Dura was opened and flapped medially based on sagittal sinus exposing the meningioma.  The arachnoid was opened around the meningioma, which was gutted and gradually removed in its entirety.  The remaining tumor was peeled off the sagittal sinus  and falx.  Hemostasis was assured with irrigation.  Hemostasis was assured and the tumor cavity was lined with Surgicell. The dura was closed with 4-0 neurilon sutures. The bone flap was replaced with plates, the fascia and galea were closed with 2-0 vicryl sutures and the skin was re approximated with staples.  A sterile occlusive dressing was placed.  Patient was returned to a supine position and taken out of head pins, then extubated in the operating room, having tolerated surgery well.  Counts were correct at the end of the case.  PLAN OF CARE: Admit to inpatient   PATIENT DISPOSITION:  PACU - hemodynamically stable.   Delay start of Pharmacological VTE agent (>24hrs) due to surgical blood loss or risk of bleeding: yes  

## 2012-11-06 NOTE — Transfer of Care (Signed)
Immediate Anesthesia Transfer of Care Note  Patient: Joshua [name]  Procedure(s) Performed: Procedure(s) with comments: CRANIOTOMY TUMOR EXCISION (Right) - Right Parasagittal craniotomy for meningioma with Stealth  Patient Location: PACU  Anesthesia Type:General  Level of Consciousness: awake, alert , oriented and patient cooperative  Airway & Oxygen Therapy: Patient Spontanous Breathing and Patient connected to nasal cannula oxygen  Post-op Assessment: Report given to PACU RN, Post -op Vital signs reviewed and stable, Patient moving all extremities and Patient able to stick tongue midline  Post vital signs: Reviewed and stable  Complications: No apparent anesthesia complications

## 2012-11-06 NOTE — Anesthesia Procedure Notes (Signed)
Procedure Name: Intubation Date/Time: 11/06/2012 7:45 AM Performed by: Marni Griffon Pre-anesthesia Checklist: Patient identified, Emergency Drugs available, Suction available and Patient being monitored Patient Re-evaluated:Patient Re-evaluated prior to inductionOxygen Delivery Method: Circle system utilized Preoxygenation: Pre-oxygenation with 100% oxygen Intubation Type: IV induction Ventilation: Mask ventilation without difficulty Laryngoscope Size: Mac and 3 Grade View: Grade I Tube type: Oral Tube size: 7.5 mm Number of attempts: 1 Airway Equipment and Method: Stylet Placement Confirmation: ETT inserted through vocal cords under direct vision,  breath sounds checked- equal and bilateral and positive ETCO2 Secured at: 22 (cm at gum) cm Tube secured with: Tape Dental Injury: Teeth and Oropharynx as per pre-operative assessment

## 2012-11-06 NOTE — Brief Op Note (Signed)
11/06/2012  9:46 AM  PATIENT:  Joshua [name]  52 y.o. male  PRE-OPERATIVE DIAGNOSIS:  Right Parasagittal Meningioma, Cluster headaches  POST-OPERATIVE DIAGNOSIS:  Right Parasagittal Meningioma, Cluster headaches  PROCEDURE:  Procedure(s) with comments: CRANIOTOMY TUMOR EXCISION (Right) - Right Parasagittal craniotomy for meningioma with Stealth  SURGEON:  Surgeon(s) and Role:    * Maeola Harman, MD - Primary    * Carmela Hurt, MD - Assisting  PHYSICIAN ASSISTANT:   ASSISTANTS: Poteat, RN   ANESTHESIA:   general  EBL:  Total I/O In: 1050 [I.V.:1050] Out: 500 [Urine:300; Blood:200]  BLOOD ADMINISTERED:none  DRAINS: none   LOCAL MEDICATIONS USED:  MARCAINE     SPECIMEN:  Excision  DISPOSITION OF SPECIMEN:  PATHOLOGY  COUNTS:  YES  TOURNIQUET:  * No tourniquets in log *  DICTATION: Patient is 52 year old man with right parasagittal meningioma 3 cm in diameter compressing his motor strip.  It was elected to take him to surgery for craniotomy for right parasagittal brain tumor.  He had a Stealth CT for surgical localization of tumor.  Procedure:  Following smooth intubation, patient was placed in supine position with brow up.  Head was placed in pins and parasagittal scalp was prepped and draped in usual sterile fashion after Stealth CTwas localized to map tumor location along with location of sagittal sinus.  Area of planned incision was infiltrated with lidocaine. A linear incision was made and carried through temporalis fascia and muscle to expose calvarium.  Skull flap was elevated exposing the dura directly overlying the brain mass after clearing bone across sagittal sinus with Kerrison and dissecting dura free from bone flap. Dura was opened and flapped medially based on sagittal sinus exposing the meningioma.  The arachnoid was opened around the meningioma, which was gutted and gradually removed in its entirety.  The remaining tumor was peeled off the sagittal sinus  and falx.  Hemostasis was assured with irrigation.  Hemostasis was assured and the tumor cavity was lined with Surgicell. The dura was closed with 4-0 neurilon sutures. The bone flap was replaced with plates, the fascia and galea were closed with 2-0 vicryl sutures and the skin was re approximated with staples.  A sterile occlusive dressing was placed.  Patient was returned to a supine position and taken out of head pins, then extubated in the operating room, having tolerated surgery well.  Counts were correct at the end of the case.  PLAN OF CARE: Admit to inpatient   PATIENT DISPOSITION:  PACU - hemodynamically stable.   Delay start of Pharmacological VTE agent (>24hrs) due to surgical blood loss or risk of bleeding: yes

## 2012-11-06 NOTE — Progress Notes (Signed)
S/p craniotomy tumor excision.  Received consult for vancomycin if patient is penicillin allergic.  He has NKDA; therefore, will continue with ordered Ancef x 2 doses.     Joshua [name] D. Laney Potash, PharmD, BCPS Pager:  [phone] 11/06/2012, 11:50 AM

## 2012-11-06 NOTE — Anesthesia Postprocedure Evaluation (Signed)
  Anesthesia Post-op Note  Patient: Joshua [name]  Procedure(s) Performed: Procedure(s) with comments: CRANIOTOMY TUMOR EXCISION (Right) - Right Parasagittal craniotomy for meningioma with Stealth  Patient Location: PACU  Anesthesia Type:General  Level of Consciousness: awake  Airway and Oxygen Therapy: Patient Spontanous Breathing  Post-op Pain: mild  Post-op Assessment: Post-op Vital signs reviewed  Post-op Vital Signs: Reviewed  Complications: No apparent anesthesia complications

## 2012-11-06 NOTE — Preoperative (Signed)
Beta Blockers   Reason not to administer Beta Blockers:Not Applicable 

## 2012-11-06 NOTE — Progress Notes (Signed)
Awake, alert, conversant.  MAEW except weak in left PF/DF.  No numbness.  Doing well.

## 2012-11-06 NOTE — Progress Notes (Signed)
11-06-12 UR completed. Ronny Flurry RN BSN

## 2012-11-07 ENCOUNTER — Encounter (HOSPITAL_COMMUNITY): Payer: Self-pay | Admitting: Neurosurgery

## 2012-11-07 MED ORDER — PANTOPRAZOLE SODIUM 40 MG PO TBEC
40.0000 mg | DELAYED_RELEASE_TABLET | Freq: Every day | ORAL | Status: DC
  Administered 2012-11-07 – 2012-11-08 (×2): 40 mg via ORAL
  Filled 2012-11-07 (×2): qty 1

## 2012-11-07 MED ORDER — ALUM & MAG HYDROXIDE-SIMETH 200-200-20 MG/5ML PO SUSP
30.0000 mL | Freq: Once | ORAL | Status: AC
  Administered 2012-11-07: 30 mL via ORAL
  Filled 2012-11-07: qty 30

## 2012-11-07 NOTE — Plan of Care (Signed)
Problem: Phase I Progression Outcomes Goal: Neuro exam at baseline or improved Outcome: Progressing L foot weakness

## 2012-11-07 NOTE — Progress Notes (Signed)
Subjective: Doing well. Getting strength back in foot.  Objective: Vital signs in last 24 hours: Temp:  [96.8 F (36 C)-99.3 F (37.4 C)] 98.4 F (36.9 C) (04/23 0728) Pulse Rate:  [58-83] 61 (04/23 0700) Resp:  [11-19] 13 (04/23 0700) BP: (98-130)/(51-91) 103/55 mmHg (04/23 0700) SpO2:  [92 %-99 %] 95 % (04/23 0700) Arterial Line BP: (96-140)/(56-74) 99/56 mmHg (04/23 0700) Weight:  [88.8 kg (195 lb 12.3 oz)] 88.8 kg (195 lb 12.3 oz) (04/22 1130) Weight change:     Intake/Output from previous day: 04/22 0701 - 04/23 0700 In: 4600 [P.O.:240; I.V.:4050; IV Piggyback:310] Out: 4965 [Urine:4765; Blood:200] Last cbgs: CBG (last 3)  No results found for this basename: GLUCAP,  in the last 72 hours   Physical Exam Dressing CDI.  Improving strength in left foot DF and PF.  Lab Results: No results found for this basename: WBC, HGB, HCT, PLT,  in the last 72 hours BMET No results found for this basename: NA, K, CL, CO2, GLUCOSE, BUN, CREATININE, CALCIUM,  in the last 72 hours  Studies/Results: No results found.  Medications: I have reviewed the patient's current medications.  Assessment/Plan: Improving.  Mobilize.  D/C A-line, Foley. PT.  May Tx to floor.  Dorian Heckle , MD 11/07/2012, 9:40 AM

## 2012-11-08 MED ORDER — DEXAMETHASONE 4 MG PO TABS: 4.0000 mg | ORAL_TABLET | Freq: Three times a day (TID) | ORAL | Status: DC

## 2012-11-08 MED ORDER — HYDROCODONE-ACETAMINOPHEN 5-325 MG PO TABS: 1.0000 | ORAL_TABLET | ORAL | Status: DC | PRN

## 2012-11-08 MED ORDER — DEXAMETHASONE 4 MG PO TABS
4.0000 mg | ORAL_TABLET | Freq: Three times a day (TID) | ORAL | Status: DC
  Administered 2012-11-08 (×2): 4 mg via ORAL
  Filled 2012-11-08 (×4): qty 1

## 2012-11-08 NOTE — Progress Notes (Signed)
Went to bedside to discuss [employer]. However, patient had already discharged. Will follow up telephonically for post transition of care call.   Raiford Noble, MSN- Ed, RN,BSN- [facility] [phone]

## 2012-11-08 NOTE — Evaluation (Signed)
Occupational Therapy Evaluation Patient Details Name: Joshua [name] MRN: [id number] DOB: 1961-01-21 Today's Date: 11/08/2012 Time: 4098-1191 OT Time Calculation (min): 29 min  OT Assessment / Plan / Recommendation Clinical Impression  Pt s/p crani for excision of para sagittal meningioma.  Pt performing functional mobility at overall mod I level.  Minimal assist needed for LB ADLs due to left foot drop, but pt's wife able to assist as needed. No further acute OT needs. Signing off.    OT Assessment  Patient does not need any further OT services    Follow Up Recommendations  No OT follow up    Barriers to Discharge      Equipment Recommendations  None recommended by OT    Recommendations for Other Services    Frequency       Precautions / Restrictions Precautions Precautions: Fall Restrictions Weight Bearing Restrictions: No   Pertinent Vitals/Pain See vitals    ADL  Lower Body Dressing: Performed;Minimal assistance (min for Left tennis shoes) Where Assessed - Lower Body Dressing: Unsupported sitting Toilet Transfer: Simulated;Modified independent Toilet Transfer Method: Sit to Barista:  (bed) Tub/Shower Transfer: Performed;Modified independent Tub/Shower Transfer Method: Science writer:  (none) Equipment Used:  (none) Transfers/Ambulation Related to ADLs: mod I for ambulation due to decreased time and smoothness due to left foot drop.  ADL Comments: Pt with difficulty donning left tennis shoe due to left foot drop and mild weakness in LLE.  Once given minimal assistnace to hold shoe steady while sliding left foot in, pt was able to finish tying shoe.   Educated pt on threading LLE through pants/undergarment first and reverse when doffing (for efficiency and ease).  Wife will be present to assist with shoes as needed at home.      OT Diagnosis:    OT Problem List:   OT Treatment Interventions:     OT Goals    Visit  Information  Last OT Received On: 11/08/12 Assistance Needed: +1    Subjective Data      Prior Functioning     Home Living Lives With: Spouse Available Help at Discharge: Family;Available 24 hours/day Type of Home: House Home Access: Stairs to enter [employer] of Steps: 6 Entrance Stairs-Rails: Right;Left;Can reach both Home Layout: One level Bathroom Shower/Tub: Engineer, manufacturing systems: Standard Home Adaptive Equipment: None Prior Function Level of Independence: Independent Able to Take Stairs?: Yes Driving: Yes Vocation: Full time employment Communication Communication: No difficulties         Vision/Perception     Cognition  Cognition Arousal/Alertness: Awake/alert Behavior During Therapy: WFL for tasks assessed/performed Overall Cognitive Status: Within Functional Limits for tasks assessed    Extremity/Trunk Assessment Right Upper Extremity Assessment RUE ROM/Strength/Tone: WFL for tasks assessed RUE Sensation: WFL - Light Touch;WFL - Proprioception RUE Coordination: WFL - gross/fine motor Left Upper Extremity Assessment LUE ROM/Strength/Tone: WFL for tasks assessed LUE Sensation: WFL - Light Touch;WFL - Proprioception LUE Coordination: WFL - gross/fine motor Right Lower Extremity Assessment RLE ROM/Strength/Tone: Within functional levels RLE Sensation: WFL - Light Touch RLE Coordination: WFL - gross/fine motor Left Lower Extremity Assessment LLE ROM/Strength/Tone: WFL for tasks assessed;Deficits LLE ROM/Strength/Tone Deficits: df/eversion 0/5 o/w 4+/5 LLE Sensation: WFL - Light Touch Trunk Assessment Trunk Assessment: Normal     Mobility Bed Mobility Bed Mobility: Supine to Sit;Sitting - Scoot to Edge of Bed Supine to Sit: 7: Independent Sitting - Scoot to Edge of Bed: 7: Independent Transfers Transfers: Sit to Stand;Stand to [employer]  Sit to Stand: 7: Independent;From bed Stand to Sit: 7: Independent;To chair/3-in-1      Exercise     Balance Balance Balance Assessed: Yes Dynamic Standing Balance Dynamic Standing - Balance Support: During functional activity;No upper extremity supported Dynamic Standing - Level of Assistance: 6: Modified independent (Device/Increase time)   End of Session OT - End of Session Equipment Utilized During Treatment:  (none) Activity Tolerance: Patient tolerated treatment well Patient left: in chair;with call bell/phone within reach;with family/visitor present (with PT) Nurse Communication: Mobility status  GO    11/08/2012 Cipriano Mile OTR/L Pager [phone] Office [phone]   Cipriano Mile 11/08/2012, 11:18 AM

## 2012-11-08 NOTE — Discharge Summary (Signed)
Physician Discharge Summary  Patient ID: Joshua [name] MRN: [id number] DOB/AGE: [birth year] 52 y.o.  Admit date: 11/06/2012 Discharge date: 11/08/2012  Admission Diagnoses:Right parasagittal meningioma  Discharge Diagnoses: Right parasagittal meningioma Active Problems:   * No active hospital problems. *   Discharged Condition: good  Hospital Course: Craniotomy for parasagittal meningioma.  Some left leg weakness post-op.  Improving.  Consults: None  Significant Diagnostic Studies: None  Treatments: surgery:  Craniotomy for parasagittal meningioma  Discharge Exam: Blood pressure 106/60, pulse 58, temperature 98 F (36.7 C), temperature source Oral, resp. rate 20, height 5\' 9"  (1.753 m), weight 88.1 kg (194 lb 3.6 oz), SpO2 96.00%. Neurologic: Alert and oriented X 3, normal strength and tone. Normal symmetric reflexes. Normal coordination and gait Wound:CDi.  Left foot weakness.  Disposition: Home     Medication List    STOP taking these medications       naproxen 500 MG tablet  Commonly known as:  NAPROSYN      TAKE these medications       dexamethasone 4 MG tablet  Commonly known as:  DECADRON  Take 1 tablet (4 mg total) by mouth every 8 (eight) hours.     HYDROcodone-acetaminophen 5-325 MG per tablet  Commonly known as:  NORCO/VICODIN  Take 1-2 tablets by mouth every 4 (four) hours as needed for pain.     ibuprofen 200 MG tablet  Commonly known as:  ADVIL,MOTRIN  Take 200 mg by mouth every 6 (six) hours as needed for headache.     metoCLOPramide 10 MG tablet  Commonly known as:  REGLAN  Take 1 tablet (10 mg total) by mouth 3 (three) times daily as needed (headache / nausea).         Signed: Dorian Heckle, MD 11/08/2012, 8:45 AM

## 2012-11-08 NOTE — Progress Notes (Signed)
Subjective: Patient reports improving leg weakness.  Objective: Vital signs in last 24 hours: Temp:  [98 F (36.7 C)-98.6 F (37 C)] 98 F (36.7 C) (04/24 0549) Pulse Rate:  [58-77] 58 (04/24 0549) Resp:  [14-20] 20 (04/24 0549) BP: (106-131)/(51-84) 106/60 mmHg (04/24 0549) SpO2:  [93 %-96 %] 96 % (04/24 0549) Arterial Line BP: (120)/(65) 120/65 mmHg (04/23 0900) Weight:  [88.1 kg (194 lb 3.6 oz)] 88.1 kg (194 lb 3.6 oz) (04/23 2033)  Intake/Output from previous day: 04/23 0701 - 04/24 0700 In: 1324 [P.O.:320; I.V.:900; IV Piggyback:104] Out: 1725 [Urine:1725] Intake/Output this shift:    Physical Exam: Improving leg strength, but still weak.  Dressing CDI  Lab Results: No results found for this basename: WBC, HGB, HCT, PLT,  in the last 72 hours BMET No results found for this basename: NA, K, CL, CO2, GLUCOSE, BUN, CREATININE, CALCIUM,  in the last 72 hours  Studies/Results: No results found.  Assessment/Plan: OK to D/C home.  Decadron taper.  Cane for safety.    LOS: 2 days    Dorian Heckle, MD 11/08/2012, 8:14 AM

## 2012-11-08 NOTE — Evaluation (Signed)
Physical Therapy Evaluation Patient Details Name: Joshua [name] MRN: [id number] DOB: [birth year] Today's Date: 11/08/2012 Time: 4098-1191 PT Time Calculation (min): 32 min  PT Assessment / Plan / Recommendation Clinical Impression  pt s/p crani for excision of para sagittal meningioma.  On eval, pt displays mild L LE weakness and L foot drop.  Pt manages the foot drop with mild steppage gait that is stable.  Gave pt a handout for a minimal control device for foot drop if he choose to get assistive orthosis.  No further PT needs at this time  Will sign off.    PT Assessment  Patent does not need any further PT services    Follow Up Recommendations  No PT follow up    Does the patient have the potential to tolerate intense rehabilitation      Barriers to Discharge        Equipment Recommendations  None recommended by PT    Recommendations for Other Services     Frequency      Precautions / Restrictions Precautions Precautions: Fall Restrictions Weight Bearing Restrictions: No   Pertinent Vitals/Pain       Mobility  Bed Mobility Bed Mobility: Supine to Sit;Sitting - Scoot to Edge of Bed Supine to Sit: 7: Independent Sitting - Scoot to [place] of Bed: 7: Independent Transfers Transfers: Sit to Stand;Stand to Sit Sit to Stand: 7: Independent Stand to Sit: 7: Independent Ambulation/Gait Ambulation/Gait Assistance: 6: Modified independent (Device/Increase time) Ambulation Distance (Feet): 450 Feet Assistive device: None Ambulation/Gait Assistance Details: Mild steppage gait on the L LE with low top"tennis" shoe on, more steppage in socked feet. Gait Pattern:  (Left foot drop) Stairs: Yes Stairs Assistance: 6: Modified independent (Device/Increase time) Stair Management Technique: One rail Right;Alternating pattern;Forwards Number of Stairs: 8 Wheelchair Mobility Wheelchair Mobility: No    Exercises     PT Diagnosis:    PT Problem List:   PT Treatment  Interventions:     PT Goals    Visit Information  Last PT Received On: 11/08/12 Assistance Needed: +1    Subjective Data  Subjective: I'm just a little worried about the foot Patient Stated Goal: Independent and back to work   Prior Functioning  Home Living Lives With: Spouse Available Help at Discharge: Family;Available 24 hours/day Type of Home: House Home Access: Stairs to enter [employer] of Steps: 6 Entrance Stairs-Rails: Right;Left;Can reach both Home Layout: One level Bathroom Shower/Tub: Engineer, manufacturing systems: Standard Home Adaptive Equipment: None Prior Function Level of Independence: Independent Able to Take Stairs?: Yes Driving: Yes Vocation: Full time employment Communication Communication: No difficulties    Cognition  Cognition Arousal/Alertness: Awake/alert Behavior During Therapy: WFL for tasks assessed/performed Overall Cognitive Status: Within Functional Limits for tasks assessed    Extremity/Trunk Assessment Right Lower Extremity Assessment RLE ROM/Strength/Tone: Within functional levels RLE Sensation: WFL - Light Touch RLE Coordination: WFL - gross/fine motor Left Lower Extremity Assessment LLE ROM/Strength/Tone: WFL for tasks assessed;Deficits LLE ROM/Strength/Tone Deficits: df/eversion 0/5 o/w 4+/5 LLE Sensation: WFL - Light Touch Trunk Assessment Trunk Assessment: Normal   Balance Balance Balance Assessed: Yes Dynamic Standing Balance Dynamic Standing - Balance Support: During functional activity;No upper extremity supported Dynamic Standing - Level of Assistance: 6: Modified independent (Device/Increase time)  End of Session PT - End of Session Activity Tolerance: Patient tolerated treatment well Patient left: in chair;with call bell/phone within reach;with family/visitor present Nurse Communication: Mobility status  GP     Joshua [name], Joshua [name] 11/08/2012, 11:01 AM  11/08/2012  [place],  PT [phone] (pager)

## 2012-11-08 NOTE — Care Management Note (Unsigned)
    Page 1 of 1   11/08/2012     2:51:23 PM   CARE MANAGEMENT NOTE 11/08/2012  Patient:  Joshua [name],Joshua [name]   Account Number:  [id number]  Date Initiated:  11/08/2012  Documentation initiated by:  [facility]  Subjective/Objective Assessment:   admitted post craniotomy for excision of parasaggital meningioma     Action/Plan:   PT/OT evals- no follow up recommended   Anticipated DC Date:  11/08/2012   Anticipated DC Plan:  HOME/SELF CARE      DC Planning Services  CM consult      Choice offered to / List presented to:     DME arranged  [facility]      DME agency  [facility]        Status of service:  Completed, signed off Medicare Important Message given?   (If response is "NO", the following Medicare IM given date fields will be blank) Date Medicare IM given:   Date Additional Medicare IM given:    Discharge Disposition:  HOME/SELF CARE  Per UR Regulation:  Reviewed for med. necessity/level of care/duration of stay  If discussed at Long Length of Stay Meetings, dates discussed:    Comments:

## 2012-12-19 ENCOUNTER — Encounter (HOSPITAL_COMMUNITY): Payer: Self-pay | Admitting: Emergency Medicine

## 2012-12-19 ENCOUNTER — Emergency Department (HOSPITAL_COMMUNITY)
Admission: EM | Admit: 2012-12-19 | Discharge: 2012-12-19 | Disposition: A | Discharge status: Home / Self Care | Payer: 59 | Attending: Emergency Medicine | Admitting: Emergency Medicine

## 2012-12-19 DIAGNOSIS — F17200 Nicotine dependence, unspecified, uncomplicated: Secondary | ICD-10-CM | POA: Insufficient documentation

## 2012-12-19 DIAGNOSIS — R519 Headache, unspecified: Secondary | ICD-10-CM

## 2012-12-19 DIAGNOSIS — J3489 Other specified disorders of nose and nasal sinuses: Secondary | ICD-10-CM | POA: Insufficient documentation

## 2012-12-19 DIAGNOSIS — Z79899 Other long term (current) drug therapy: Secondary | ICD-10-CM | POA: Insufficient documentation

## 2012-12-19 DIAGNOSIS — Z87448 Personal history of other diseases of urinary system: Secondary | ICD-10-CM | POA: Insufficient documentation

## 2012-12-19 DIAGNOSIS — Z9889 Other specified postprocedural states: Secondary | ICD-10-CM | POA: Insufficient documentation

## 2012-12-19 DIAGNOSIS — R0981 Nasal congestion: Secondary | ICD-10-CM

## 2012-12-19 DIAGNOSIS — R51 Headache: Secondary | ICD-10-CM | POA: Insufficient documentation

## 2012-12-19 MED ORDER — MOMETASONE FUROATE 50 MCG/ACT NA SUSP: 2.0000 | Freq: Every day | NASAL | Status: DC

## 2012-12-19 MED ORDER — NAPROXEN 500 MG PO TABS: 500.0000 mg | ORAL_TABLET | Freq: Two times a day (BID) | ORAL | Status: DC

## 2012-12-19 MED ORDER — METOCLOPRAMIDE HCL 10 MG PO TABS: 10.0000 mg | ORAL_TABLET | Freq: Four times a day (QID) | ORAL | Status: DC

## 2012-12-19 NOTE — ED Notes (Signed)
Pt reports he had brain sx April 22nd for tumor removal. Since then has been having cluster headaches. States pain is intermittent, sharp in nature, located in right eye area. Pt reports some relief with advil but pain returns. Pt alert, oriented x4. VSs.

## 2012-12-19 NOTE — ED Provider Notes (Signed)
History     CSN: [id number]  Arrival date & time 12/19/12  1328   First MD Initiated Contact with Patient 12/19/12 1633      Chief Complaint  Patient presents with  . Headache    (Consider location/radiation/quality/duration/timing/severity/associated sxs/prior treatment) HPI Pt w hx of cataract surgery right eye, meningioma (followed by Dr. Venetia Maxon, craniotomy on 11/06/2012) presentst to the ER c/o headaches that occur every other day, worse at night & beginning to interfere with sleep. HA located in the left orbital region and associated with occasional blurred vision. Pt states he has a follow up MRI with his neurosurgeon this Friday & declines imaging, stating that he just wants Reglan and naproxen as they help his cluster HA. Pt denies F, NS, chills, neck pain, dizziness, ataxia, double vision, or seizure. No known exacerbating or alleviating factors.  In addition patient states concern for her chronic sinusitis stating that he has had sinus pressure for the last couple of days.    Past Medical History  Diagnosis Date  . Dizziness   . Headache(784.0)     scattered  . Enlarged prostate   . Meningioma     Past Surgical History  Procedure Laterality Date  . Cataract extraction Right   . Right knee arthroscopy    . Esophagogastroduodenoscopy    . Craniotomy Right 11/06/2012    Procedure: CRANIOTOMY TUMOR EXCISION;  Surgeon: Maeola Harman, MD;  Location: [facility];  Service: Neurosurgery;  Laterality: Right;  Right Parasagittal craniotomy for meningioma with Stealth    History reviewed. No pertinent family history.  History  Substance Use Topics  . Smoking status: Current Some Day Smoker    Types: Cigarettes  . Smokeless tobacco: Not on file     Comment: hasn't smoked in a week  . Alcohol Use: Yes     Comment: occasionally      Review of Systems Ten systems reviewed and are negative for acute change, except as noted in the HPI.    Allergies  Review of patient's  allergies indicates no known allergies.  Home Medications   Current Outpatient Rx  Name  Route  Sig  Dispense  Refill  . dexamethasone (DECADRON) 4 MG tablet   Oral   Take 4 mg by mouth every 8 (eight) hours.         Marland Kitchen HYDROcodone-acetaminophen (NORCO/VICODIN) 5-325 MG per tablet   Oral   Take 1-2 tablets by mouth every 4 (four) hours as needed for pain.         Marland Kitchen ibuprofen (ADVIL,MOTRIN) 200 MG tablet   Oral   Take 200 mg by mouth every 6 (six) hours as needed for headache.           BP 132/98  Pulse 70  Temp(Src) 98.7 F (37.1 C) (Oral)  Resp 16  SpO2 97%  Physical Exam  Nursing note and vitals reviewed. Constitutional: He is oriented to person, place, and time. He appears well-developed and well-nourished. He appears distressed.  HENT:  Head: Normocephalic and atraumatic.  Mild frontal and maxillary right sided tenderness to palpation.  No rhinorrhea.  Eyes: Conjunctivae and EOM are normal. Pupils are equal, round, and reactive to light. No scleral icterus.  Right eye cataract surgery   Neck: Normal range of motion.  Neck supple with no nuchal rigidity, full pain free ROM. No carotid bruit  Cardiovascular: Normal rate, regular rhythm, normal heart sounds and intact distal pulses.   Pulmonary/Chest: Effort normal and breath sounds normal. No  respiratory distress.  Musculoskeletal: Normal range of motion.  Neurological: He is alert and oriented to person, place, and time. He has normal strength.  CN III-XII intact, good coordination, normal gait, strength 5/5 bilaterally, intact distal sensation.  Skin: Skin is warm and dry.  No rash, non diaphoretic    ED Course  Procedures (including critical care time)  Labs Reviewed - No data to display No results found.   No diagnosis found. Filed Vitals:   12/19/12 1332 12/19/12 1733  BP: 132/98 130/86  Pulse: 70 80  Temp: 98.7 F (37.1 C)   TempSrc: Oral   Resp: 16 14  SpO2: 97% 95%     Medications - No  data to display  Discharge Medication List as of 12/19/2012  5:07 PM    START taking these medications   Details  metoCLOPramide (REGLAN) 10 MG tablet Take 1 tablet (10 mg total) by mouth every 6 (six) hours., Starting 12/19/2012, Until Discontinued, Print    mometasone (NASONEX) 50 MCG/ACT nasal spray Place 2 sprays into the nose daily., Starting 12/19/2012, Until Discontinued, Print    naproxen (NAPROSYN) 500 MG tablet Take 1 tablet (500 mg total) by mouth 2 (two) times daily., Starting 12/19/2012, Until Discontinued, Print          MDM  Patient emergency department complaining of cluster type headaches located in the right orbital region.  Patient has history of similar headaches prior to his craniotomy (treating meningioma) as well as after.  Patient states that he has just come to the emergency department to get some Reglan and naproxen as these medications help with headaches.  Followup MRI scheduled for this Friday with neurologist.  Patient declines current imaging.  At this time there does not appear to be any evidence of an acute emergency medical condition and the patient appears stable for discharge with appropriate outpatient follow up.Diagnosis was discussed with patient who verbalizes understanding and is agreeable to discharge. Pt case discussed with Dr. Clarene Duke in who agrees with my plan.         Jaci Carrel, [place] 12/20/12 1610

## 2012-12-19 NOTE — ED Notes (Signed)
Pt presents with intermittent headaches for the past 2 weeks, denies ha being sensitive to light or sound.  Pt was dx with brain tumor and had it surgically removed on 12/26/2012.  Pt had relief of headaches until 2 weeks ago.  Pt neurologist is Dr. Rush Farmer, has follow up appointment with him on 12/26/2012.  Pt also having sinus pressure to the right side of his face.  PERRLA, neuro exam negative.  Pt alert and oriented X 4.

## 2012-12-21 NOTE — ED Provider Notes (Signed)
Medical screening examination/treatment/procedure(s) were performed by non-physician practitioner and as supervising physician I was immediately available for consultation/collaboration.   Bishoy Cupp M Braxon Suder, DO 12/21/12 1441 

## 2013-02-20 ENCOUNTER — Ambulatory Visit (HOSPITAL_COMMUNITY)
Admission: RE | Admit: 2013-02-20 | Discharge: 2013-02-20 | Disposition: A | Discharge status: Home / Self Care | Payer: 59 | Attending: Psychiatry | Admitting: Psychiatry

## 2013-02-20 ENCOUNTER — Encounter (HOSPITAL_COMMUNITY): Payer: Self-pay | Admitting: *Deleted

## 2013-02-20 DIAGNOSIS — D496 Neoplasm of unspecified behavior of brain: Secondary | ICD-10-CM

## 2013-02-20 DIAGNOSIS — F1010 Alcohol abuse, uncomplicated: Secondary | ICD-10-CM | POA: Insufficient documentation

## 2013-02-20 HISTORY — DX: Neoplasm of unspecified behavior of brain: D49.6

## 2013-02-20 NOTE — BH Assessment (Signed)
Assessment Note  Joshua [name] is an 52 y.o. male. Here for an appt for CD IOP. He was referred here by [facility] to participated in IOP. He had surgury for a brain tumor in March 2014 and since then has been drinking 6 beers and several shots of liquor twice a week. He had been drinking alcohol prior to his surgery but it has become more of an issue with him being unemployed and it causes conflict with his wife.As a result of his brain tumor and surgery he has left sided weakness, cluster headaches and blurred vision. He is currently on FMLA. He has exhausted his short term dissability and his long term has not yet started or been approved so he is without an income and this is very stressful to him. His wife is also not working now for one year and she has some income from an unknown source but she is spending it on the house they live in and the utilities.He states he smokes only when he is drinking. 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 He is not depressed, suicidal or homicidal and is not psychotic.He has never been inpatient before but has attended some self help groups in the past like AA. Charmian Muff had been expecting him this am at 9 am and could not meet with him after Clinical research associate completed the assessment. She came up to meet him and gave him an appt for Fri the 15th at 9am to have orientation with her and to return later the same day for the start of CD IOP. An MSE was not completed as he was an appt.He verbalized understanding of his next appt and voices his enthusiasm for this program.Pleasant and cooperative.  Axis I: Alcohol Abuse Axis II: Deferred Axis III:   Past Medical History  Diagnosis Date  . Dizziness   . Headache(784.0)     scattered  . Enlarged prostate   . Meningioma   . Brain tumor 02/20/2013    brain tumor removed in March 2014, Dr Rush Farmer   Axis IV: economic problems, other psychosocial or environmental problems and problems with primary support group Axis V: 51-60 moderate symptoms  Past Medical History:  Past Medical History  Diagnosis Date  . Dizziness   . Headache(784.0)     scattered  . Enlarged prostate   . Meningioma   . Brain tumor 02/20/2013    brain tumor removed in March 2014, Dr Rush Farmer    Past Surgical History  Procedure Laterality Date  . Cataract extraction Right   . Right knee arthroscopy    . Esophagogastroduodenoscopy    . Craniotomy Right 11/06/2012    Procedure: CRANIOTOMY TUMOR EXCISION;  Surgeon: Maeola Harman, MD;  Location: [facility];  Service: Neurosurgery;  Laterality: Right;  Right Parasagittal craniotomy for meningioma with Stealth    Family History: No family history on file.  Social History:  reports that he has been smoking Cigarettes.  He has been smoking about 0.00 packs per day. He has never used smokeless tobacco. He reports that he drinks about 4.8 ounces of alcohol per week. He reports that  he does not use illicit drugs.  Additional Social History:  Alcohol / Drug Use Pain Medications: not abusing Prescriptions: not abusing Over the Counter: not abusing History of alcohol / drug use?: Yes Substance #1 Name of Substance 1: alcohol 1 - Age of First Use: unknown 1 - Amount (size/oz): 6 beers and 2-3 shots of liquor 1 - Frequency: twice a week 1 - Duration: since May 2014 1 - Last Use / Amount: Sat, Aug 2nd  CIWA:   COWS:    Allergies: No Known Allergies  Home Medications:  (Not in a hospital admission)  OB/GYN Status:  No LMP for male patient.  General Assessment Data Location of Assessment: [facility] Is this a Tele or Face-to-Face Assessment?:  Face-to-Face Is this an Initial Assessment or a Re-assessment for this encounter?: Initial Assessment Living Arrangements: Spouse/significant other Can pt return to current living arrangement?: Yes Admission Status:  (assessment for CD IOP) Is patient capable of signing voluntary admission?: Yes Transfer from: Home (ref by E[facility] for CD IOP) Referral Source:  ([facility])  Education Status Is patient currently in school?: No Highest grade of school patient has completed: 9  Risk to self Suicidal Ideation: No Suicidal Intent: No Is patient at risk for suicide?: No Suicidal Plan?: No Access to Means: No What has been your use of drugs/alcohol within the last 12 months?: drinks alcohol twice a week Previous Attempts/Gestures: No How many times?: 0 Other Self Harm Risks: none Triggers for Past Attempts: None known Intentional Self Injurious Behavior: None Family Suicide History: No Recent stressful life event(s): Conflict (Comment) (some friction at home with wife) Persecutory voices/beliefs?: No Depression: No Depression Symptoms:  (none reported) Substance abuse history and/or treatment for substance abuse?: Yes Suicide prevention information given to non-admitted patients: Not applicable  Risk to Others Homicidal Ideation: No Thoughts of Harm to Others: No Current Homicidal Intent: No Current Homicidal Plan: No Access to Homicidal Means: No History of harm to others?: No Assessment of Violence: None Noted Does patient have access to weapons?: No Criminal Charges Pending?: No Does patient have a court date: No  Psychosis Hallucinations: None noted Delusions: None noted  Mental Status Report Appear/Hygiene:  (unremarkable) Eye Contact: Good Motor Activity: Freedom of movement Speech: Logical/coherent Level of Consciousness: Alert Mood: Anhedonia Affect: Appropriate to circumstance Anxiety Level: None Thought Processes: Coherent;Relevant Judgement: Impaired Orientation:  Person;Place;Time;Situation Obsessive Compulsive Thoughts/Behaviors: None  Cognitive Functioning Concentration: Normal Memory: Recent Intact;Remote Intact IQ: Average Insight: Good Impulse Control: Good Appetite: Good Weight Loss:  (0) Weight Gain: 0 Sleep: No Change Total Hours of Sleep:  (not assessed) Vegetative Symptoms: None  ADLScreening [facility] Assessment Services) Patient's cognitive ability adequate to safely complete daily activities?: Yes Patient able to express need for assistance with ADLs?: Yes Independently performs ADLs?: Yes (appropriate for developmental age)  Prior Inpatient Therapy Prior Inpatient Therapy: No  Prior Outpatient Therapy Prior Outpatient Therapy: No Prior Therapy Dates:  (had a session with [facility] counselor,referred here for IOP)  ADL Screening (condition at time of admission) Patient's cognitive ability adequate to safely complete daily activities?: Yes Is the patient deaf or have difficulty hearing?: No Does the patient have difficulty seeing, even when wearing glasses/contacts?: No Does the patient have difficulty concentrating, remembering, or making decisions?: No Patient able to express need for assistance with ADLs?: Yes Does the patient have difficulty dressing or bathing?: No Independently performs ADLs?: Yes (appropriate for developmental age) Does the patient have difficulty walking or climbing stairs?: No Weakness of  Legs: None Weakness of Arms/Hands: None  Home Assistive Devices/Equipment Home Assistive Devices/Equipment: None    Abuse/Neglect Assessment (Assessment to be complete while patient is alone) Physical Abuse: Denies Verbal Abuse: Denies Sexual Abuse: Denies Exploitation of patient/patient's resources: Denies Self-Neglect: Denies Values / Beliefs Cultural Requests During Hospitalization: None Spiritual Requests During Hospitalization: None   Advance Directives (For Healthcare) Advance Directive: Patient does not  have advance directive;Patient would not like information Pre-existing out of facility DNR order (yellow form or pink MOST form): No Nutrition Screen- [facility] Patient's home diet: Regular  Additional Information 1:1 In Past 12 Months?: No CIRT Risk: No Elopement Risk: No Does patient have medical clearance?: No     Disposition:  Disposition Initial Assessment Completed for this Encounter: Yes Disposition of Patient: Outpatient treatment Type of outpatient treatment: Chemical Dependence - Intensive Outpatient  On Site Evaluation by:   Reviewed with Physician:    Wynona Luna 02/20/2013 11:14 AM

## 2013-06-05 ENCOUNTER — Emergency Department (INDEPENDENT_AMBULATORY_CARE_PROVIDER_SITE_OTHER): Payer: 59

## 2013-06-05 ENCOUNTER — Emergency Department (HOSPITAL_COMMUNITY)
Admission: EM | Admit: 2013-06-05 | Discharge: 2013-06-05 | Disposition: A | Discharge status: Home / Self Care | Payer: 59 | Source: Home / Self Care | Attending: Family Medicine | Admitting: Family Medicine

## 2013-06-05 DIAGNOSIS — M4810 Ankylosing hyperostosis [Forestier], site unspecified: Secondary | ICD-10-CM

## 2013-06-05 DIAGNOSIS — M545 Low back pain, unspecified: Secondary | ICD-10-CM

## 2013-06-05 HISTORY — DX: Ankylosing hyperostosis (forestier), site unspecified: M48.10

## 2013-06-05 MED ORDER — TRAMADOL HCL 50 MG PO TABS: 50.0000 mg | ORAL_TABLET | Freq: Two times a day (BID) | ORAL | Status: DC | PRN

## 2013-06-05 NOTE — ED Notes (Signed)
Pt c/o lower back pain and right flank pain onset 2-3 months Pain is intermittent and increases w/activity Denies: inj/trauma, f/v/n/d, urinary sxs Alert w/no signs of acute distress.

## 2013-06-05 NOTE — ED Provider Notes (Signed)
Joshua [name] is a 52 y.o. male who presents to Urgent Care today for right low back pain. This is been present for the last 2-3 months. He denies any specific injury. The pain does not radiate. He denies any weakness numbness difficulty walking bowel or bladder dysfunction. He has used ibuprofen which has been somewhat helpful.  Pain is moderate and worse with activity.    Past Medical History  Diagnosis Date  . Dizziness   . Headache(784.0)     scattered  . Enlarged prostate   . Meningioma   . Brain tumor 02/20/2013    brain tumor removed in March 2014, Dr Rush Farmer   History  Substance Use Topics  . Smoking status: Current Some Day Smoker    Types: Cigarettes  . Smokeless tobacco: Never Used     Comment: hasn't smoked in a week  . Alcohol Use: 4.8 oz/week    6 Cans of beer, 2 Shots of liquor per week     Comment: occasionally   ROS as above Medications reviewed. No current facility-administered medications for this encounter.   Current Outpatient Prescriptions  Medication Sig Dispense Refill  . mometasone (NASONEX) 50 MCG/ACT nasal spray Place 2 sprays into the nose daily.  17 g  12  . traMADol (ULTRAM) 50 MG tablet Take 1 tablet (50 mg total) by mouth every 12 (twelve) hours as needed for moderate pain.  50 tablet  1    Exam:  BP 126/85  Pulse 69  Temp(Src) 98.3 F (36.8 C) (Oral)  Resp 20  SpO2 96% Gen: Well NAD HEENT: EOMI,  MMM Lungs: CTABL Nl WOB Heart: RRR no MRG Abd: NABS, NT, ND no masses palpated Exts: Non edematous BL  LE, warm and well perfused.  Back: Nontender to spinal midline normal flexion extension range of motion. Decreased lateral flexion and rotation. Patient is tender in his right lumbar paraspinals. Lower extremity strength is intact. Patient can stand on his toes and heels squat and walk normally. Sensation capillary refill are intact distally.  No results found for this or any previous visit (from the past 24 hour(s)). Dg Lumbar Spine  Complete  06/05/2013   CLINICAL DATA:  Low right back pain  EXAM: LUMBAR SPINE - COMPLETE 4+ VIEW  COMPARISON:  Abdominal radiography 11/30/2005  FINDINGS: There is mild curvature convex to the right. There are large bridging osteophytes in the upper lumbar region primarily on the right side. There is left-sided osteophyte formation at L4-5. There is mild disc space narrowing at L4-5. There is facet degeneration at L4-5 and L5-S1. Both sacroiliac joints show osteoarthritis. The osteophytes have progressed since 2007.  IMPRESSION: Pattern of prominent bridging osteophytes likely to indicate diffuse idiopathic skeletal hyperostosis.  Degenerative facet disease in the lower lumbar spine and degenerative sacroiliac arthropathy, which could be associated with the patient's symptoms.   Electronically Signed   By: Paulina Fusi M.D.   On: 06/05/2013 12:11    Assessment and Plan: 52 y.o. male with diffuse idiopathic skeletal hyperostosis. This is the cause of his low back pain. Plan for tramadol for pain control. Followup with Dr. Farris Has at [facility]. Patient male does not require referral to neurosurgery versus physical therapy trial. Discussed warning signs or symptoms. Please see discharge instructions. Patient expresses understanding.      Rodolph Bong, MD 06/05/13 [phone]

## 2014-03-27 ENCOUNTER — Other Ambulatory Visit: Payer: Self-pay | Admitting: Occupational Medicine

## 2014-03-27 ENCOUNTER — Ambulatory Visit: Payer: Self-pay

## 2014-03-27 DIAGNOSIS — R52 Pain, unspecified: Secondary | ICD-10-CM

## 2014-04-03 ENCOUNTER — Ambulatory Visit: Payer: Self-pay

## 2014-04-03 ENCOUNTER — Other Ambulatory Visit: Payer: Self-pay | Admitting: Occupational Medicine

## 2014-04-03 DIAGNOSIS — R52 Pain, unspecified: Secondary | ICD-10-CM

## 2014-04-09 ENCOUNTER — Emergency Department (HOSPITAL_COMMUNITY)
Admission: EM | Admit: 2014-04-09 | Discharge: 2014-04-09 | Disposition: A | Discharge status: Home / Self Care | Payer: 59 | Attending: Emergency Medicine | Admitting: Emergency Medicine

## 2014-04-09 ENCOUNTER — Encounter (HOSPITAL_COMMUNITY): Payer: Self-pay | Admitting: Emergency Medicine

## 2014-04-09 DIAGNOSIS — Z87448 Personal history of other diseases of urinary system: Secondary | ICD-10-CM | POA: Diagnosis not present

## 2014-04-09 DIAGNOSIS — M25551 Pain in right hip: Secondary | ICD-10-CM

## 2014-04-09 DIAGNOSIS — F17200 Nicotine dependence, unspecified, uncomplicated: Secondary | ICD-10-CM | POA: Diagnosis not present

## 2014-04-09 DIAGNOSIS — M25559 Pain in unspecified hip: Secondary | ICD-10-CM | POA: Insufficient documentation

## 2014-04-09 DIAGNOSIS — IMO0002 Reserved for concepts with insufficient information to code with codable children: Secondary | ICD-10-CM | POA: Diagnosis not present

## 2014-04-09 DIAGNOSIS — Z86011 Personal history of benign neoplasm of the brain: Secondary | ICD-10-CM | POA: Insufficient documentation

## 2014-04-09 DIAGNOSIS — G8911 Acute pain due to trauma: Secondary | ICD-10-CM | POA: Insufficient documentation

## 2014-04-09 MED ORDER — KETOROLAC TROMETHAMINE 60 MG/2ML IM SOLN
60.0000 mg | Freq: Once | INTRAMUSCULAR | Status: AC
  Administered 2014-04-09: 60 mg via INTRAMUSCULAR
  Filled 2014-04-09: qty 2

## 2014-04-09 MED ORDER — MELOXICAM 7.5 MG PO TABS: 15.0000 mg | ORAL_TABLET | Freq: Every day | ORAL | Status: DC

## 2014-04-09 NOTE — ED Notes (Signed)
Rt hip pain after injury on 9/10 hurts to walk and sleep

## 2014-04-09 NOTE — Discharge Instructions (Signed)
Take the prescribed medication as directed.  If taking this medication, recommend limiting other NSAIDs (motrin, aleve, diclofenac from prior visit). Follow-up with a primary care physician.  May wish to see orthopedics if still interested in cortisone injections. Return to the ED for new or worsening symptoms.

## 2014-04-09 NOTE — ED Provider Notes (Signed)
Medical screening examination/treatment/procedure(s) were performed by non-physician practitioner and as supervising physician I was immediately available for consultation/collaboration.   EKG Interpretation None        Francine Graven, DO 04/09/14 1821

## 2014-04-09 NOTE — ED Provider Notes (Signed)
CSN: [id number]     Arrival date & time 04/09/14  0719 History   First MD Initiated Contact with Patient 04/09/14 [phone]     Chief Complaint  Patient presents with  . Hip Pain     (Consider location/radiation/quality/duration/timing/severity/associated sxs/prior Treatment) The history is provided by the patient and medical records.   This is a 53 y.o. M with PMH significant for headaches, enlarged prostate, brain tumor/meningioma, presenting to the ED for right hip pain.  Patient is employed by [employer], states he slipped and fell on a pen on 03/27/14 which caused his hip to twist and pop.  No head injury or LOC.  States he was seen by employee health and had x-rays done, all of which were negative.  States he has had persistent pain of his right hip since fall, unrelieved by motrin and diclofenac.  Denies any new injuries, trauma, or falls.  No numbness, weakness, or paresthesias of right leg.  No prior right hip injuries or surgeries.  Has been ambulating without difficulty, just has persistent pain.  VS stable on arrival.  Past Medical History  Diagnosis Date  . Dizziness   . Headache(784.0)     scattered  . Enlarged prostate   . Meningioma   . Brain tumor 02/20/2013    brain tumor removed in March 2014, Dr Donald Pore   Past Surgical History  Procedure Laterality Date  . Cataract extraction Right   . Right knee arthroscopy    . Esophagogastroduodenoscopy    . Craniotomy Right 11/06/2012    Procedure: CRANIOTOMY TUMOR EXCISION;  Surgeon: Erline Levine, MD;  Location: [facility];  Service: Neurosurgery;  Laterality: Right;  Right Parasagittal craniotomy for meningioma with Stealth   No family history on file. History  Substance Use Topics  . Smoking status: Current Some Day Smoker    Types: Cigarettes  . Smokeless tobacco: Never Used     Comment: hasn't smoked in a week  . Alcohol Use: 4.8 oz/week    6 Cans of beer, 2 Shots of liquor per week     Comment: occasionally     Review of Systems  Musculoskeletal: Positive for arthralgias.  All other systems reviewed and are negative.     Allergies  Review of patient's allergies indicates no known allergies.  Home Medications   Prior to Admission medications   Medication Sig Start Date End Date Taking? Authorizing Provider  mometasone (NASONEX) 50 MCG/ACT nasal spray Place 2 sprays into the nose daily. 12/19/12   Lisette Paz, PA-C  traMADol (ULTRAM) 50 MG tablet Take 1 tablet (50 mg total) by mouth every 12 (twelve) hours as needed for moderate pain. 06/05/13   Gregor Hams, MD   BP 158/85  Pulse 77  Temp(Src) 98 F (36.7 C) (Oral)  Resp 16  Wt 180 lb (81.647 kg)  SpO2 99%  Physical Exam  Nursing note and vitals reviewed. Constitutional: He is oriented to person, place, and time. He appears well-developed and well-nourished. No distress.  HENT:  Head: Normocephalic and atraumatic.  Mouth/Throat: Oropharynx is clear and moist.  Eyes: Conjunctivae and EOM are normal. Pupils are equal, round, and reactive to light.  Neck: Normal range of motion. Neck supple.  Cardiovascular: Normal rate, regular rhythm and normal heart sounds.   Pulmonary/Chest: Effort normal and breath sounds normal. No respiratory distress. He has no wheezes.  Musculoskeletal: Normal range of motion.  Endorses pain of anterior right hip without focal tenderness or deformities noted; full ROM  of right hip, knee, and ankle without difficulty; leg remains NVI; ambulating unassisted without difficulty  Neurological: He is alert and oriented to person, place, and time.  Skin: Skin is warm and dry. He is not diaphoretic.  Psychiatric: He has a normal mood and affect.    ED Course  Procedures (including critical care time) Labs Review Labs Reviewed - No data to display  Imaging Review No results found.   EKG Interpretation None      MDM   Final diagnoses:  Hip pain, right   Slip and fall on 03/27/14, negative imaging of  hip and back performed at employee health.  X-rays were reviewed, some arthritis changes noted of bilateral hips, right appears somewhat worse than left.  Without new injury do not feel that repeat imaging of much benefit.  Sx likely due to exacerbation of arthritis.  No new focal deficits on exam today, ambulating unassisted without difficulty.  Patient given shot of toradol in the ED.  Recommended to establish care with PCP (has resource guide from previous visit) and FU with orthopedics as he is interested in potential cortisone shots.  Rx mobic, recommended discontinuing motrin/diclofenac to avoid NSAID overuse and GI upset.  Discussed plan with patient, he/she acknowledged understanding and agreed with plan of care.  Return precautions given for new or worsening symptoms.  Larene Pickett, PA-C 04/09/14 [phone]

## 2014-08-07 ENCOUNTER — Encounter (HOSPITAL_COMMUNITY): Payer: Self-pay | Admitting: *Deleted

## 2014-08-07 ENCOUNTER — Emergency Department (HOSPITAL_COMMUNITY)
Admission: EM | Admit: 2014-08-07 | Discharge: 2014-08-07 | Disposition: A | Discharge status: Home / Self Care | Payer: Managed Care, Other (non HMO) | Attending: Emergency Medicine | Admitting: Emergency Medicine

## 2014-08-07 DIAGNOSIS — Z87438 Personal history of other diseases of male genital organs: Secondary | ICD-10-CM | POA: Insufficient documentation

## 2014-08-07 DIAGNOSIS — Z791 Long term (current) use of non-steroidal anti-inflammatories (NSAID): Secondary | ICD-10-CM | POA: Insufficient documentation

## 2014-08-07 DIAGNOSIS — Z720 Tobacco use: Secondary | ICD-10-CM | POA: Insufficient documentation

## 2014-08-07 DIAGNOSIS — K648 Other hemorrhoids: Secondary | ICD-10-CM | POA: Diagnosis not present

## 2014-08-07 DIAGNOSIS — Z7951 Long term (current) use of inhaled steroids: Secondary | ICD-10-CM | POA: Insufficient documentation

## 2014-08-07 DIAGNOSIS — K6289 Other specified diseases of anus and rectum: Secondary | ICD-10-CM | POA: Diagnosis present

## 2014-08-07 DIAGNOSIS — Z86011 Personal history of benign neoplasm of the brain: Secondary | ICD-10-CM | POA: Insufficient documentation

## 2014-08-07 MED ORDER — DOCUSATE SODIUM 100 MG PO CAPS: 100.0000 mg | ORAL_CAPSULE | Freq: Two times a day (BID) | ORAL | Status: DC | PRN

## 2014-08-07 MED ORDER — HYDROMORPHONE HCL 1 MG/ML IJ SOLN
2.0000 mg | Freq: Once | INTRAMUSCULAR | Status: AC
Start: 2014-08-07 — End: 2014-08-07
  Administered 2014-08-07: 2 mg via INTRAMUSCULAR
  Filled 2014-08-07: qty 2

## 2014-08-07 MED ORDER — HYDROCODONE-ACETAMINOPHEN 5-325 MG PO TABS: 2.0000 | ORAL_TABLET | Freq: Once | ORAL | Status: DC

## 2014-08-07 MED ORDER — ONDANSETRON 4 MG PO TBDP
8.0000 mg | ORAL_TABLET | Freq: Once | ORAL | Status: AC
Start: 2014-08-07 — End: 2014-08-07
  Administered 2014-08-07: 8 mg via ORAL
  Filled 2014-08-07: qty 2

## 2014-08-07 MED ORDER — HYDROCODONE-ACETAMINOPHEN 5-325 MG PO TABS: 1.0000 | ORAL_TABLET | ORAL | Status: DC | PRN

## 2014-08-07 MED ORDER — LIDOCAINE HCL 2 % EX GEL
1.0000 "application " | Freq: Once | CUTANEOUS | Status: AC
  Administered 2014-08-07: 1 via TOPICAL
  Filled 2014-08-07: qty 20

## 2014-08-07 NOTE — ED Notes (Addendum)
Pt states difficulty passing stool x 1 week. States he has been taking stool softeners with no relief.  Pt states able to pass stool, but it is painful.

## 2014-08-07 NOTE — Discharge Instructions (Signed)
Read the information below.  Use the prescribed medication as directed.  Please discuss all new medications with your pharmacist.  Do not take additional tylenol while taking the prescribed pain medication to avoid overdose.  You may return to the Emergency Department at any time for worsening condition or any new symptoms that concern you.   Be aware that narcotic pain medication can make you more constipated.  If you develop fevers, abdominal pain, uncontrolled rectal pain, are unable to have a bowel movement, or excessive bleeding, return to the Emergency Department immediately for a recheck.    Hemorrhoids Hemorrhoids are swollen veins around the rectum or anus. There are two types of hemorrhoids:   Internal hemorrhoids. These occur in the veins just inside the rectum. They may poke through to the outside and become irritated and painful.  External hemorrhoids. These occur in the veins outside the anus and can be felt as a painful swelling or hard lump near the anus. CAUSES  Pregnancy.   Obesity.   Constipation or diarrhea.   Straining to have a bowel movement.   Sitting for long periods on the toilet.  Heavy lifting or other activity that caused you to strain.  Anal intercourse. SYMPTOMS   Pain.   Anal itching or irritation.   Rectal bleeding.   Fecal leakage.   Anal swelling.   One or more lumps around the anus.  DIAGNOSIS  Your caregiver may be able to diagnose hemorrhoids by visual examination. Other examinations or tests that may be performed include:   Examination of the rectal area with a gloved hand (digital rectal exam).   Examination of anal canal using a small tube (scope).   A blood test if you have lost a significant amount of blood.  A test to look inside the colon (sigmoidoscopy or colonoscopy). TREATMENT Most hemorrhoids can be treated at home. However, if symptoms do not seem to be getting better or if you have a lot of rectal bleeding,  your caregiver may perform a procedure to help make the hemorrhoids get smaller or remove them completely. Possible treatments include:   Placing a rubber band at the base of the hemorrhoid to cut off the circulation (rubber band ligation).   Injecting a chemical to shrink the hemorrhoid (sclerotherapy).   Using a tool to burn the hemorrhoid (infrared light therapy).   Surgically removing the hemorrhoid (hemorrhoidectomy).   Stapling the hemorrhoid to block blood flow to the tissue (hemorrhoid stapling).  HOME CARE INSTRUCTIONS   Eat foods with fiber, such as whole grains, beans, nuts, fruits, and vegetables. Ask your doctor about taking products with added fiber in them (fibersupplements).  Increase fluid intake. Drink enough water and fluids to keep your urine clear or pale yellow.   Exercise regularly.   Go to the bathroom when you have the urge to have a bowel movement. Do not wait.   Avoid straining to have bowel movements.   Keep the anal area dry and clean. Use wet toilet paper or moist towelettes after a bowel movement.   Medicated creams and suppositories may be used or applied as directed.   Only take over-the-counter or prescription medicines as directed by your caregiver.   Take warm sitz baths for 15-20 minutes, 3-4 times a day to ease pain and discomfort.   Place ice packs on the hemorrhoids if they are tender and swollen. Using ice packs between sitz baths may be helpful.   Put ice in a plastic bag.  Place a towel between your skin and the bag.   Leave the ice on for 15-20 minutes, 3-4 times a day.   Do not use a donut-shaped pillow or sit on the toilet for long periods. This increases blood pooling and pain.  SEEK MEDICAL CARE IF:  You have increasing pain and swelling that is not controlled by treatment or medicine.  You have uncontrolled bleeding.  You have difficulty or you are unable to have a bowel movement.  You have pain or  inflammation outside the area of the hemorrhoids. MAKE SURE YOU:  Understand these instructions.  Will watch your condition.  Will get help right away if you are not doing well or get worse. Document Released: 07/01/2000 Document Revised: 06/20/2012 Document Reviewed: 05/08/2012 [facility] Patient Information 2015 [place], [place]. This information is not intended to replace advice given to you by your health care provider. Make sure you discuss any questions you have with your health care provider.

## 2014-08-07 NOTE — ED Provider Notes (Signed)
CSN: [id number]     Arrival date & time 08/07/14  0710 History   First MD Initiated Contact with Patient 08/07/14 [phone]     Chief Complaint  Patient presents with  . Rectal Pain     (Consider location/radiation/quality/duration/timing/severity/associated sxs/prior Treatment) The history is provided by the patient.     Patient with hx internal hemorrhoids presents with rectal pain.  States he has had hard stools x 1 week.  One week ago he noticed one episode of blood on the toilet paper, no blood since.  States that every time he has a bowel movement it feels like he has razor blades in his rectum/anus.  After he has a bowel movement he has pain that lasts 6-7 hours.  Has tried stool softeners, tucks medicated pads, anal ointment and other hemorrhoid treatment without improvement.  Continues to have small hard stools.    Denies fevers, N/V, abdominal pain, urinary symptoms.   States this feels exactly like his prior bout with hemorrhoids 4 years ago.  He has never seen a Psychologist, sport and exercise for this problem.   Past Medical History  Diagnosis Date  . Dizziness   . Headache(784.0)     scattered  . Enlarged prostate   . Meningioma   . Brain tumor 02/20/2013    brain tumor removed in March 2014, Dr Donald Pore   Past Surgical History  Procedure Laterality Date  . Cataract extraction Right   . Right knee arthroscopy    . Esophagogastroduodenoscopy    . Craniotomy Right 11/06/2012    Procedure: CRANIOTOMY TUMOR EXCISION;  Surgeon: Erline Levine, MD;  Location: [facility];  Service: Neurosurgery;  Laterality: Right;  Right Parasagittal craniotomy for meningioma with Stealth   No family history on file. History  Substance Use Topics  . Smoking status: Current Some Day Smoker    Types: Cigarettes  . Smokeless tobacco: Never Used     Comment: hasn't smoked in a week  . Alcohol Use: 4.8 oz/week    6 Cans of beer, 2 Shots of liquor per week     Comment: occasionally    Review of Systems  All other  systems reviewed and are negative.     Allergies  Review of patient's allergies indicates no known allergies.  Home Medications   Prior to Admission medications   Medication Sig Start Date End Date Taking? Authorizing Provider  meloxicam (MOBIC) 7.5 MG tablet Take 2 tablets (15 mg total) by mouth daily. 04/09/14   Larene Pickett, PA-C  mometasone (NASONEX) 50 MCG/ACT nasal spray Place 2 sprays into the nose daily. 12/19/12   Lisette Paz, PA-C  traMADol (ULTRAM) 50 MG tablet Take 1 tablet (50 mg total) by mouth every 12 (twelve) hours as needed for moderate pain. 06/05/13   Gregor Hams, MD   BP 143/91 mmHg  Pulse 84  Temp(Src) 98.3 F (36.8 C) (Oral)  Resp 18  SpO2 98% Physical Exam  Constitutional: He appears well-developed and well-nourished. No distress.  HENT:  Head: Normocephalic and atraumatic.  Neck: Neck supple.  Pulmonary/Chest: Effort normal.  Abdominal: Soft. He exhibits no distension. There is no tenderness. There is no rebound and no guarding.  Genitourinary: Rectal exam shows external hemorrhoid (soft, flat, nontender). Rectal exam shows anal tone normal.  8:22 AM Pt unable to tolerate rectal exam secondary to pain.   9:29 AM Circumferential internal hemorrhoids, soft. Diffusely tender. No induration or firm areas.  No stool impaction.  No focal tenderness.  Hemorrhoids do not  prolapse with valsava.    Neurological: He is alert.  Skin: He is not diaphoretic.  Nursing note and vitals reviewed.   ED Course  Procedures (including critical care time) Labs Review Labs Reviewed - No data to display  Imaging Review No results found.   EKG Interpretation None      MDM   Final diagnoses:  Internal hemorrhoids    Afebrile nontoxic patient with known internal hemorrhoids presents with hemorrhoid pain and constipation.  NO abdominal pain or tenderness.  Pt does have internal hemorrhoids that are soft.  No clinical e/o thrombosis or rectal abscess.  Pt d/c home  with norco, colace, general surgery follow up.  Discussed result, findings, treatment, and follow up  with patient.  Pt given return precautions.  Pt verbalizes understanding and agrees with plan.        Clayton Bibles, PA-C 08/07/14 [place], MD 08/08/14 [phone]

## 2014-08-27 ENCOUNTER — Emergency Department (INDEPENDENT_AMBULATORY_CARE_PROVIDER_SITE_OTHER)
Admission: EM | Admit: 2014-08-27 | Discharge: 2014-08-27 | Disposition: A | Discharge status: Home / Self Care | Payer: Self-pay | Source: Home / Self Care | Attending: Family Medicine | Admitting: Family Medicine

## 2014-08-27 ENCOUNTER — Encounter (HOSPITAL_COMMUNITY): Payer: Self-pay | Admitting: Emergency Medicine

## 2014-08-27 DIAGNOSIS — K6289 Other specified diseases of anus and rectum: Secondary | ICD-10-CM

## 2014-08-27 MED ORDER — DICLOFENAC SODIUM 50 MG PO TBEC: 50.0000 mg | DELAYED_RELEASE_TABLET | Freq: Two times a day (BID) | ORAL | Status: DC | PRN

## 2014-08-27 MED ORDER — HYDROCORTISONE ACETATE 25 MG RE SUPP: 25.0000 mg | Freq: Two times a day (BID) | RECTAL | Status: DC

## 2014-08-27 NOTE — ED Provider Notes (Signed)
Joshua [name] is a 54 y.o. male who presents to Urgent Care today for hemorrhoids. Patient has rectal pain present off and on for a few weeks now. He was seen in the emergency room recently. He has been using stool softeners fiber and topical hemorrhoid cream. He is having difficulty establishing an appointment with [facility] as he is having an insurance issue. No bleeding fevers or chills vomiting or diarrhea. He thinks he may be able to have an appointment on Monday with the general surgeons office..   Past Medical History  Diagnosis Date  . Dizziness   . Headache(784.0)     scattered  . Enlarged prostate   . Meningioma   . Brain tumor 02/20/2013    brain tumor removed in March 2014, Dr Donald Pore   Past Surgical History  Procedure Laterality Date  . Cataract extraction Right   . Right knee arthroscopy    . Esophagogastroduodenoscopy    . Craniotomy Right 11/06/2012    Procedure: CRANIOTOMY TUMOR EXCISION;  Surgeon: Erline Levine, MD;  Location: [facility];  Service: Neurosurgery;  Laterality: Right;  Right Parasagittal craniotomy for meningioma with Stealth   History  Substance Use Topics  . Smoking status: Current Some Day Smoker    Types: Cigarettes  . Smokeless tobacco: Never Used     Comment: hasn't smoked in a week  . Alcohol Use: 4.8 oz/week    6 Cans of beer, 2 Shots of liquor per week     Comment: occasionally   ROS as above Medications: No current facility-administered medications for this encounter.   Current Outpatient Prescriptions  Medication Sig Dispense Refill  . diclofenac (VOLTAREN) 50 MG EC tablet Take 1 tablet (50 mg total) by mouth 2 (two) times daily as needed. 60 tablet 0  . docusate sodium (COLACE) 100 MG capsule Take 1 capsule (100 mg total) by mouth 2 (two) times daily as needed for mild constipation or moderate constipation. 30 capsule 0  . hydrocortisone (ANUSOL-HC) 25 MG suppository Place 1 suppository (25 mg total) rectally 2 (two)  times daily. 12 suppository 1  . ibuprofen (ADVIL,MOTRIN) 400 MG tablet Take 400 mg by mouth every 6 (six) hours as needed for mild pain.    . mometasone (NASONEX) 50 MCG/ACT nasal spray Place 2 sprays into the nose daily. (Patient not taking: Reported on 08/07/2014) 17 g 12  . phenylephrine-shark liver oil-mineral oil-petrolatum (PREPARATION H) 0.25-3-14-71.9 % rectal ointment Place 1 application rectally 2 (two) times daily as needed for hemorrhoids.    . traMADol (ULTRAM) 50 MG tablet Take 1 tablet (50 mg total) by mouth every 12 (twelve) hours as needed for moderate pain. (Patient not taking: Reported on 08/07/2014) 50 tablet 1  . witch hazel-glycerin (TUCKS) pad Apply 1 application topically as needed for itching.     No Known Allergies   Exam:  BP 133/82 mmHg  Pulse 72  Temp(Src) 99 F (37.2 C) (Oral)  Resp 16  SpO2 96% Gen: Well NAD Rectal exam. Flat nonthrombosed external hemorrhoid nontender. No lesions. Patient declined internal rectal exam.  No results found for this or any previous visit (from the past 24 hour(s)). No results found.  Assessment and Plan: 54 y.o. male with the pain. Unclear etiology likely internal hemorrhoids or rectal fissure. Patient declines exam. I cannot really tell what is going on at this time. Plan to treat with hydrocortisone suppositories and diclofenac for pain. Encouraged patient follow-up with surgeon  Discussed warning signs or symptoms. Please  see discharge instructions. Patient expresses understanding.     Gregor Hams, MD 08/27/14 [phone]

## 2014-08-27 NOTE — Discharge Instructions (Signed)
Thank you for coming in today. Follow up with [facility]  Hemorrhoids Hemorrhoids are swollen veins around the rectum or anus. There are two types of hemorrhoids:   Internal hemorrhoids. These occur in the veins just inside the rectum. They may poke through to the outside and become irritated and painful.  External hemorrhoids. These occur in the veins outside the anus and can be felt as a painful swelling or hard lump near the anus. CAUSES  Pregnancy.   Obesity.   Constipation or diarrhea.   Straining to have a bowel movement.   Sitting for long periods on the toilet.  Heavy lifting or other activity that caused you to strain.  Anal intercourse. SYMPTOMS   Pain.   Anal itching or irritation.   Rectal bleeding.   Fecal leakage.   Anal swelling.   One or more lumps around the anus.  DIAGNOSIS  Your caregiver may be able to diagnose hemorrhoids by visual examination. Other examinations or tests that may be performed include:   Examination of the rectal area with a gloved hand (digital rectal exam).   Examination of anal canal using a small tube (scope).   A blood test if you have lost a significant amount of blood.  A test to look inside the colon (sigmoidoscopy or colonoscopy). TREATMENT Most hemorrhoids can be treated at home. However, if symptoms do not seem to be getting better or if you have a lot of rectal bleeding, your caregiver may perform a procedure to help make the hemorrhoids get smaller or remove them completely. Possible treatments include:   Placing a rubber band at the base of the hemorrhoid to cut off the circulation (rubber band ligation).   Injecting a chemical to shrink the hemorrhoid (sclerotherapy).   Using a tool to burn the hemorrhoid (infrared light therapy).   Surgically removing the hemorrhoid (hemorrhoidectomy).   Stapling the hemorrhoid to block blood flow to the tissue (hemorrhoid stapling).  HOME  CARE INSTRUCTIONS   Eat foods with fiber, such as whole grains, beans, nuts, fruits, and vegetables. Ask your doctor about taking products with added fiber in them (fibersupplements).  Increase fluid intake. Drink enough water and fluids to keep your urine clear or pale yellow.   Exercise regularly.   Go to the bathroom when you have the urge to have a bowel movement. Do not wait.   Avoid straining to have bowel movements.   Keep the anal area dry and clean. Use wet toilet paper or moist towelettes after a bowel movement.   Medicated creams and suppositories may be used or applied as directed.   Only take over-the-counter or prescription medicines as directed by your caregiver.   Take warm sitz baths for 15-20 minutes, 3-4 times a day to ease pain and discomfort.   Place ice packs on the hemorrhoids if they are tender and swollen. Using ice packs between sitz baths may be helpful.   Put ice in a plastic bag.   Place a towel between your skin and the bag.   Leave the ice on for 15-20 minutes, 3-4 times a day.   Do not use a donut-shaped pillow or sit on the toilet for long periods. This increases blood pooling and pain.  SEEK MEDICAL CARE IF:  You have increasing pain and swelling that is not controlled by treatment or medicine.  You have uncontrolled bleeding.  You have difficulty or you are unable to have a bowel movement.  You have pain or inflammation  outside the area of the hemorrhoids. MAKE SURE YOU:  Understand these instructions.  Will watch your condition.  Will get help right away if you are not doing well or get worse. Document Released: 07/01/2000 Document Revised: 06/20/2012 Document Reviewed: 05/08/2012 [facility] Patient Information 2015 [place], [place]. This information is not intended to replace advice given to you by your health care provider. Make sure you discuss any questions you have with your health care provider.

## 2014-08-27 NOTE — ED Notes (Signed)
Pt was diagnosed with internal hemorrhoids from the ED in January.  Pt has not fully recovered and feels he needs some time off from work to heal.  He was unable to follow up with [facility] because they told him his insurance wouldn't be accepted.

## 2015-06-29 ENCOUNTER — Emergency Department (HOSPITAL_COMMUNITY)
Admission: EM | Admit: 2015-06-29 | Discharge: 2015-06-29 | Disposition: A | Discharge status: Home / Self Care | Payer: Managed Care, Other (non HMO) | Attending: Emergency Medicine | Admitting: Emergency Medicine

## 2015-06-29 ENCOUNTER — Emergency Department (HOSPITAL_COMMUNITY): Payer: Managed Care, Other (non HMO)

## 2015-06-29 ENCOUNTER — Encounter (HOSPITAL_COMMUNITY): Payer: Self-pay | Admitting: *Deleted

## 2015-06-29 DIAGNOSIS — Y998 Other external cause status: Secondary | ICD-10-CM | POA: Insufficient documentation

## 2015-06-29 DIAGNOSIS — F17210 Nicotine dependence, cigarettes, uncomplicated: Secondary | ICD-10-CM | POA: Insufficient documentation

## 2015-06-29 DIAGNOSIS — W108XXA Fall (on) (from) other stairs and steps, initial encounter: Secondary | ICD-10-CM | POA: Insufficient documentation

## 2015-06-29 DIAGNOSIS — Z7951 Long term (current) use of inhaled steroids: Secondary | ICD-10-CM | POA: Insufficient documentation

## 2015-06-29 DIAGNOSIS — Z86018 Personal history of other benign neoplasm: Secondary | ICD-10-CM | POA: Insufficient documentation

## 2015-06-29 DIAGNOSIS — S40011A Contusion of right shoulder, initial encounter: Secondary | ICD-10-CM | POA: Insufficient documentation

## 2015-06-29 DIAGNOSIS — Z7952 Long term (current) use of systemic steroids: Secondary | ICD-10-CM | POA: Insufficient documentation

## 2015-06-29 DIAGNOSIS — Z87438 Personal history of other diseases of male genital organs: Secondary | ICD-10-CM | POA: Insufficient documentation

## 2015-06-29 DIAGNOSIS — Y9389 Activity, other specified: Secondary | ICD-10-CM | POA: Insufficient documentation

## 2015-06-29 DIAGNOSIS — Z86011 Personal history of benign neoplasm of the brain: Secondary | ICD-10-CM | POA: Insufficient documentation

## 2015-06-29 DIAGNOSIS — Y9289 Other specified places as the place of occurrence of the external cause: Secondary | ICD-10-CM | POA: Insufficient documentation

## 2015-06-29 MED ORDER — METHOCARBAMOL 500 MG PO TABS
750.0000 mg | ORAL_TABLET | Freq: Once | ORAL | Status: AC
  Administered 2015-06-29: 750 mg via ORAL
  Filled 2015-06-29: qty 2

## 2015-06-29 MED ORDER — METHOCARBAMOL 500 MG PO TABS: 500.0000 mg | ORAL_TABLET | Freq: Two times a day (BID) | ORAL | Status: DC | PRN

## 2015-06-29 MED ORDER — NAPROXEN 500 MG PO TABS: 500.0000 mg | ORAL_TABLET | Freq: Two times a day (BID) | ORAL | Status: DC

## 2015-06-29 MED ORDER — NAPROXEN 250 MG PO TABS
500.0000 mg | ORAL_TABLET | Freq: Once | ORAL | Status: AC
  Administered 2015-06-29: 500 mg via ORAL
  Filled 2015-06-29: qty 2

## 2015-06-29 NOTE — ED Provider Notes (Signed)
CSN: [id number]     Arrival date & time 06/29/15  1457 History  By signing my name below, I, Starleen Arms, attest that this documentation has been prepared under the direction and in the presence of Corine Solorio, [place]. Electronically Signed: Starleen Arms ED Scribe. 06/29/2015. 4:20 PM.    Chief Complaint  Patient presents with  . Shoulder Pain    The history is provided by the patient. No language interpreter was used.   HPI Comments: Joshua [name] is a 54 y.o. male with no chronic conditions who presents to the Emergency Department complaining of constant, 7/10, throbbing right shoulder pain after tumbling down an entire flight of stairs last night.  He reports jamming the shoulder during the fall while trying to catch himself on outstretched hands.  He has not tried any treatments PTA and takes no medications with the exception of intermittent ibuprofen use.  He denies LOC, head trauma, difficulty ambulating, extremity numbness/tingling.     Past Medical History  Diagnosis Date  . Dizziness   . Headache(784.0)     scattered  . Enlarged prostate   . Meningioma ([place])   . Brain tumor ([place]) 02/20/2013    brain tumor removed in March 2014, Dr Donald Pore   Past Surgical History  Procedure Laterality Date  . Cataract extraction Right   . Right knee arthroscopy    . Esophagogastroduodenoscopy    . Craniotomy Right 11/06/2012    Procedure: CRANIOTOMY TUMOR EXCISION;  Surgeon: Erline Levine, MD;  Location: [facility];  Service: Neurosurgery;  Laterality: Right;  Right Parasagittal craniotomy for meningioma with Stealth   History reviewed. No pertinent family history. Social History  Substance Use Topics  . Smoking status: Current Some Day Smoker    Types: Cigarettes  . Smokeless tobacco: Never Used     Comment: hasn't smoked in a week  . Alcohol Use: 4.8 oz/week    6 Cans of beer, 2 Shots of liquor per week     Comment: occasionally    Review of Systems  Musculoskeletal: Positive  for arthralgias.  All other systems reviewed and are negative.     Allergies  Review of patient's allergies indicates no known allergies.  Home Medications   Prior to Admission medications   Medication Sig Start Date End Date Taking? Authorizing Provider  diclofenac (VOLTAREN) 50 MG EC tablet Take 1 tablet (50 mg total) by mouth 2 (two) times daily as needed. 08/27/14  Yes Gregor Hams, MD  docusate sodium (COLACE) 100 MG capsule Take 1 capsule (100 mg total) by mouth 2 (two) times daily as needed for mild constipation or moderate constipation. 08/07/14  Yes Clayton Bibles, PA-C  hydrocortisone (ANUSOL-HC) 25 MG suppository Place 1 suppository (25 mg total) rectally 2 (two) times daily. 08/27/14  Yes Gregor Hams, MD  ibuprofen (ADVIL,MOTRIN) 400 MG tablet Take 400 mg by mouth every 6 (six) hours as needed for mild pain.   Yes Historical Provider, MD  phenylephrine-shark liver oil-mineral oil-petrolatum (PREPARATION H) 0.25-3-14-71.9 % rectal ointment Place 1 application rectally 2 (two) times daily as needed for hemorrhoids.   Yes Historical Provider, MD  witch hazel-glycerin (TUCKS) pad Apply 1 application topically as needed for itching.   Yes Historical Provider, MD  mometasone (NASONEX) 50 MCG/ACT nasal spray Place 2 sprays into the nose daily. Patient not taking: Reported on 08/07/2014 12/19/12   Verl Dicker, PA-C  traMADol (ULTRAM) 50 MG tablet Take 1 tablet (50 mg total) by mouth every 12 (twelve)  hours as needed for moderate pain. Patient not taking: Reported on 08/07/2014 06/05/13   Gregor Hams, MD   BP 147/93 mmHg  Pulse 78  Temp(Src) 98.7 F (37.1 C) (Oral)  Resp 16  Ht 5\' 9"  (1.753 m)  Wt 184 lb 6 oz (83.632 kg)  BMI 27.22 kg/m2  SpO2 99% Physical Exam  Constitutional: He is oriented to person, place, and time. He appears well-developed and well-nourished. No distress.  HENT:  Head: Normocephalic and atraumatic.  Eyes: Conjunctivae and EOM are normal.  Neck: Normal range of  motion. Neck supple. No tracheal deviation present.  Cardiovascular: Normal rate, regular rhythm and normal heart sounds.   Pulmonary/Chest: Effort normal and breath sounds normal. No respiratory distress.  Musculoskeletal: Normal range of motion.  Right shoulder: no swelling. No bruising.  FROM.  Strength and sensation intact.  No c-spine tenderness.    Neurological: He is alert and oriented to person, place, and time.  Skin: Skin is warm and dry.  Psychiatric: He has a normal mood and affect. His behavior is normal.  Nursing note and vitals reviewed.   ED Course  Procedures (including critical care time)  DIAGNOSTIC STUDIES: Oxygen Saturation is 98% on RA, normal by my interpretation.    COORDINATION OF CARE:  4:14 PM Discussed treatment plan with patient at bedside.  Patient acknowledges and agrees with plan.    Labs Review Labs Reviewed - No data to display  Imaging Review Dg Shoulder Right  06/29/2015  CLINICAL DATA:  Right shoulder pain after falling yesterday. Initial encounter. EXAM: RIGHT SHOULDER - 2+ VIEW COMPARISON:  None. FINDINGS: The mineralization and alignment are normal. There is no evidence of acute fracture or dislocation. The subacromial space is preserved. There are mild acromioclavicular degenerative changes with anterior downsloping of the acromion. IMPRESSION: No acute osseous findings. Mild acromioclavicular degenerative changes. Electronically Signed   By: Richardean Sale M.D.   On: 06/29/2015 16:03   I have personally reviewed and evaluated these images and lab results as part of my medical decision-making.   EKG Interpretation None      MDM   Final diagnoses:  Shoulder contusion, right, initial encounter    54 y.o. male presenting after mechanical fall last night down stairs. Endorses right shoulder pain otherwise no complaints. VSS. Exam unremarkable. XR negative for acute pathology. Will give naproxen and robaxin here with rx for home. Resource  guide given for PCP f/u. ER return precautions given.  I personally performed the services described in this documentation, which was scribed in my presence. The recorded information has been reviewed and is accurate.   Anne Ng, PA-C 06/29/15 1625  Daleen Bo, MD 07/09/15 1600

## 2015-06-29 NOTE — ED Notes (Signed)
Declined W/C at D/C and was escorted to lobby by RN. 

## 2015-06-29 NOTE — ED Notes (Signed)
PT reports he fell down aprox . 20 stairs last night. Pt reports he extended his arms to stop the fall . Pt now has pain in the RT shoulder . Pt states' I jammed my arm into my shoulder."

## 2015-06-29 NOTE — Discharge Instructions (Signed)
You were seen in the ER today for your shoulder. The x-ray shows no evidence of fracture or other acute injury. Your exam was completely normal. You may take Naproxen 500mg  twice a day as needed for pain. You may also take Robaxin, a muscle relaxant, as needed. Be careful because the muscle relaxant can make you drowsy so do not combine with alcohol and do not operate machinery while taking it. Return to the ER for new or concerning symptoms.  Take medications as prescribed. Return to the emergency room for worsening condition or new concerning symptoms. Follow up with your regular doctor. If you don't have a regular doctor use one of the numbers below to establish a primary care doctor.   Emergency Department Resource Guide 1) Find a Doctor and Pay Out of Pocket Although you won't have to find out who is covered by your insurance plan, it is a good idea to ask around and get recommendations. You will then need to call the office and see if the doctor you have chosen will accept you as a new patient and what types of options they offer for patients who are self-pay. Some doctors offer discounts or will set up payment plans for their patients who do not have insurance, but you will need to ask so you aren't surprised when you get to your appointment.  2) Contact Your Local Health Department Not all health departments have doctors that can see patients for sick visits, but many do, so it is worth a call to see if yours does. If you don't know where your local health department is, you can check in your phone book. The CDC also has a tool to help you locate your state's health department, and many state websites also have listings of all of their local health departments.  3) Find a [facility] If your illness is not likely to be very severe or complicated, you may want to try a walk in clinic. These are popping up all over the country in pharmacies, drugstores, and shopping centers. They're usually  staffed by nurse practitioners or physician assistants that have been trained to treat common illnesses and complaints. They're usually fairly quick and inexpensive. However, if you have serious medical issues or chronic medical problems, these are probably not your best option.  No Primary Care Doctor: - Call Health Connect at  [phone] - they can help you locate a primary care doctor that  accepts your insurance, provides certain services, etc. - Physician Referral [phone]  Emergency Department Resource Guide 1) Find a Doctor and Pay Out of Pocket Although you won't have to find out who is covered by your insurance plan, it is a good idea to ask around and get recommendations. You will then need to call the office and see if the doctor you have chosen will accept you as a new patient and what types of options they offer for patients who are self-pay. Some doctors offer discounts or will set up payment plans for their patients who do not have insurance, but you will need to ask so you aren't surprised when you get to your appointment.  2) Contact Your Local Health Department Not all health departments have doctors that can see patients for sick visits, but many do, so it is worth a call to see if yours does. If you don't know where your local health department is, you can check in your phone book. The CDC also has a tool to help you  locate your state's health department, and many state websites also have listings of all of their local health departments.  3) Find a [facility] If your illness is not likely to be very severe or complicated, you may want to try a walk in clinic. These are popping up all over the country in pharmacies, drugstores, and shopping centers. They're usually staffed by nurse practitioners or physician assistants that have been trained to treat common illnesses and complaints. They're usually fairly quick and inexpensive. However, if you have serious medical  issues or chronic medical problems, these are probably not your best option.  No Primary Care Doctor: - Call Health Connect at  [phone] - they can help you locate a primary care doctor that  accepts your insurance, provides certain services, etc. - Physician Referral Service- [phone]  Chronic Pain Problems: Organization         Address  Phone   Notes  [facility]  [phone] Patients need to be referred by their primary care doctor.   Medication Assistance: Organization         Address  Phone   Notes  [facility] Medication [facility] [address] [place], [name] [address] [phone] --Must be a resident of [facility] -- Must have NO insurance coverage whatsoever (no Medicaid/ Medicare, etc.) -- The pt. MUST have a primary care doctor that directs their care regularly and follows them in the community   [facility]  [phone]   [employer]  [phone]    Agencies that provide inexpensive medical care: Organization         Address  Phone   Notes  [place]  [phone]   [facility]    [phone]   [facility] [place], [place] [address] [phone]   [place] [address], [place] [phone]   [facility]    [phone]   [facility]    [phone]   [place] and [place] [address], [place] Phone:  [phone], Fax:  [phone] Hours of Operation:  9 am - 6 pm, M-F.  Also accepts Medicaid/Medicare and self-pay.  [facility] for [place], [address], [place] Phone: [phone], Fax: [phone]. Hours of Operation:  8:30 am - 5:30 pm, M-F.  Also accepts Medicaid and self-pay.  [facility] [address], [place] Phone: [phone]   [place], [place], [place]  [phone], Ext. [phone] Mondays & Thursdays: 7-9 AM.  First 15 patients are seen on a first come, first serve basis.    [place] Providers:  Organization         Address  Phone   Notes  [facility] [address], [place] [phone] Also accepts self-pay patients.  [facility] [id number] [place], [place]  [phone]   [place], [address], [place] [phone]   [facility] [address], [place] [phone]   Lucianne Lei [address], [place]   [phone] Only accepts [place] [facility] [place] patients after they have their name applied to their card.   Self-Pay (no insurance) in [facility]:  Organization  Address  Phone   Notes  Sickle Cell Patients, [facility] [place] [phone]   [facility] [place] [phone]   [facility] [place]  [place], [address], [place] [phone]   [facility]/Dr. Osei-Bonsu  [address] [place] or [address], [place] [phone] Phone number for both [place] and [employer] locations is the same.  [facility] [address] [place] [phone]   [facility] [address], [place] or [address] [phone]   [facility] [address], [place] [phone], phone; [phone], fax Sees patients 1st and 3rd Saturday of every month.  Must not qualify for public or private insurance (i.e. Medicaid, Medicare, [place] Health Choice, Veterans' Benefits)  Household income should be no more than 200% of the poverty level The clinic cannot treat you if you are pregnant or think you are pregnant  Sexually transmitted  diseases are not treated at the clinic.

## 2015-12-15 ENCOUNTER — Encounter (HOSPITAL_COMMUNITY): Payer: Self-pay | Admitting: Emergency Medicine

## 2015-12-15 ENCOUNTER — Emergency Department (HOSPITAL_COMMUNITY)
Admission: EM | Admit: 2015-12-15 | Discharge: 2015-12-15 | Disposition: A | Discharge status: Home / Self Care | Payer: Managed Care, Other (non HMO) | Attending: Emergency Medicine | Admitting: Emergency Medicine

## 2015-12-15 DIAGNOSIS — M25511 Pain in right shoulder: Secondary | ICD-10-CM | POA: Insufficient documentation

## 2015-12-15 DIAGNOSIS — Z79899 Other long term (current) drug therapy: Secondary | ICD-10-CM | POA: Insufficient documentation

## 2015-12-15 DIAGNOSIS — Z79891 Long term (current) use of opiate analgesic: Secondary | ICD-10-CM | POA: Insufficient documentation

## 2015-12-15 DIAGNOSIS — F17210 Nicotine dependence, cigarettes, uncomplicated: Secondary | ICD-10-CM | POA: Insufficient documentation

## 2015-12-15 DIAGNOSIS — Z791 Long term (current) use of non-steroidal anti-inflammatories (NSAID): Secondary | ICD-10-CM | POA: Insufficient documentation

## 2015-12-15 MED ORDER — NAPROXEN 500 MG PO TABS: 500.0000 mg | ORAL_TABLET | Freq: Two times a day (BID) | ORAL | Status: DC

## 2015-12-15 MED ORDER — TRAMADOL HCL 50 MG PO TABS: 50.0000 mg | ORAL_TABLET | Freq: Four times a day (QID) | ORAL | Status: DC | PRN

## 2015-12-15 NOTE — ED Provider Notes (Signed)
CSN: [id number]     Arrival date & time 12/15/15  [id number] History   First MD Initiated Contact with Patient 12/15/15 1007     Chief Complaint  Patient presents with  . Shoulder Pain     (Consider location/radiation/quality/duration/timing/severity/associated sxs/prior Treatment) HPI Comments: Patient presents today with a chief complaint of right shoulder pain.  Pain has been present for the past 6 months after falling down the stairs.  He was seen in the ED five months ago for this pain and had a xray at that time, which showed degenerate changes at the [facility] joint, but was otherwise negative.  He states that the pain is worse at night.  Pain also worse with abduction of the shoulder.  He has taken Ibuprofen for the pain, but does not feel that it helps.  He has not seen an Orthopedist for the pain.  He denies any numbness or tingling of the arm.    Patient is a 55 y.o. male presenting with shoulder pain. The history is provided by the patient.  Shoulder Pain   Past Medical History  Diagnosis Date  . Dizziness   . Headache(784.0)     scattered  . Enlarged prostate   . Meningioma ([place])   . Brain tumor ([place]) 02/20/2013    brain tumor removed in March 2014, Dr Donald Pore   Past Surgical History  Procedure Laterality Date  . Cataract extraction Right   . Right knee arthroscopy    . Esophagogastroduodenoscopy    . Craniotomy Right 11/06/2012    Procedure: CRANIOTOMY TUMOR EXCISION;  Surgeon: Erline Levine, MD;  Location: [place] [facility];  Service: Neurosurgery;  Laterality: Right;  Right Parasagittal craniotomy for meningioma with Stealth   No family history on file. Social History  Substance Use Topics  . Smoking status: Current Some Day Smoker    Types: Cigarettes  . Smokeless tobacco: Never Used     Comment: hasn't smoked in a week  . Alcohol Use: 4.8 oz/week    6 Cans of beer, 2 Shots of liquor per week     Comment: occasionally    Review of Systems  All other systems reviewed and are  negative.     Allergies  Review of patient's allergies indicates no known allergies.  Home Medications   Prior to Admission medications   Medication Sig Start Date End Date Taking? Authorizing Provider  diclofenac (VOLTAREN) 50 MG EC tablet Take 1 tablet (50 mg total) by mouth 2 (two) times daily as needed. 08/27/14   Gregor Hams, MD  docusate sodium (COLACE) 100 MG capsule Take 1 capsule (100 mg total) by mouth 2 (two) times daily as needed for mild constipation or moderate constipation. 08/07/14   Clayton Bibles, PA-C  hydrocortisone (ANUSOL-HC) 25 MG suppository Place 1 suppository (25 mg total) rectally 2 (two) times daily. 08/27/14   Gregor Hams, MD  ibuprofen (ADVIL,MOTRIN) 400 MG tablet Take 400 mg by mouth every 6 (six) hours as needed for mild pain.    Historical Provider, MD  methocarbamol (ROBAXIN) 500 MG tablet Take 1 tablet (500 mg total) by mouth 2 (two) times daily as needed for muscle spasms. 06/29/15   Olivia Canter Sam, PA-C  mometasone (NASONEX) 50 MCG/ACT nasal spray Place 2 sprays into the nose daily. Patient not taking: Reported on 08/07/2014 12/19/12   Lisette Paz, PA-C  naproxen (NAPROSYN) 500 MG tablet Take 1 tablet (500 mg total) by mouth 2 (two) times daily. 06/29/15   Anne Ng,  PA-C  phenylephrine-shark liver oil-mineral oil-petrolatum (PREPARATION H) 0.25-3-14-71.9 % rectal ointment Place 1 application rectally 2 (two) times daily as needed for hemorrhoids.    Historical Provider, MD  traMADol (ULTRAM) 50 MG tablet Take 1 tablet (50 mg total) by mouth every 12 (twelve) hours as needed for moderate pain. Patient not taking: Reported on 08/07/2014 06/05/13   Gregor Hams, MD  witch hazel-glycerin (TUCKS) pad Apply 1 application topically as needed for itching.    Historical Provider, MD   BP 124/84 mmHg  Pulse 85  Temp(Src) 98.7 F (37.1 C) (Oral)  Resp 16  SpO2 97% Physical Exam  Constitutional: He appears well-developed and well-nourished.  HENT:  Head:  Normocephalic and atraumatic.  Neck: Normal range of motion. Neck supple.  Cardiovascular: Normal rate, regular rhythm and normal heart sounds.   Pulses:      Radial pulses are 2+ on the right side, and 2+ on the left side.  Pulmonary/Chest: Effort normal and breath sounds normal.  Musculoskeletal:  Pain with abduction of his right shoulder.  No erythema, edema, or warmth of the right shoulder.  Full ROM of the shoulder.  Neurological: He is alert.  Distal sensation of the right hand intact Grip strength 5/5 bilaterally  Skin: Skin is warm and dry.  Psychiatric: He has a normal mood and affect.  Nursing note and vitals reviewed.   ED Course  Procedures (including critical care time) Labs Review Labs Reviewed - No data to display  Imaging Review No results found. I have personally reviewed and evaluated these images and lab results as part of my medical decision-making.   EKG Interpretation None      MDM   Final diagnoses:  None   Patient presents today with pain of the right shoulder for the past 6 months after falling down the stairs.  Xray five months ago was negative for fracture.  He reports that the pain is worse at night and that pain is worse with abduction.  Suspect rotator cuff injury.  He is neurovascularly intact.  No signs of infection of the joint.  Stable for discharge.  Return precautions given.    Hyman Bible, PA-C 12/15/15 1107  Tanna Furry, MD 12/24/15 2318

## 2015-12-15 NOTE — ED Notes (Signed)
Per pt, states he fell 5 months ago injuring right shoulder-had xray done 2 months after it happened-still having pain-unable to sleep and raise right arm

## 2016-07-18 HISTORY — PX: JOINT REPLACEMENT: SHX530

## 2016-11-07 ENCOUNTER — Ambulatory Visit (HOSPITAL_COMMUNITY): Admission: RE | Admit: 2016-11-07 | Payer: Self-pay | Source: Home / Self Care | Admitting: Psychiatry

## 2016-11-07 NOTE — BH Assessment (Signed)
Pt filled out as a walk in but left without being seen. Pt denied any SI /HI/A/VH.

## 2018-03-15 ENCOUNTER — Encounter: Payer: Self-pay | Admitting: Physical Therapy

## 2018-03-15 ENCOUNTER — Ambulatory Visit: Payer: Commercial Managed Care - PPO | Attending: Orthopedic Surgery | Admitting: Physical Therapy

## 2018-03-15 DIAGNOSIS — M25551 Pain in right hip: Secondary | ICD-10-CM

## 2018-03-15 DIAGNOSIS — M25651 Stiffness of right hip, not elsewhere classified: Secondary | ICD-10-CM | POA: Insufficient documentation

## 2018-03-15 NOTE — Therapy (Signed)
[place], [place], [address] Phone: [phone]   Fax:  [phone]  Physical Therapy Evaluation  Patient Details  Name: Joshua [name] MRN: [id number] Date of Birth: [birth year] Referring Provider: Elveria Royals   Encounter Date: 03/15/2018  PT End of Session - 03/15/18 1527    Visit Number  1    Number of Visits  12    PT Start Time  1450    PT Stop Time  1525    PT Time Calculation (min)  35 min    Activity Tolerance  Patient tolerated treatment well       Past Medical History:  Diagnosis Date  . Brain tumor ([place]) 02/20/2013   brain tumor removed in March 2014, Dr Donald Pore  . Dizziness   . Enlarged prostate   . Headache(784.0)    scattered  . Meningioma [facility])     Past Surgical History:  Procedure Laterality Date  . CATARACT EXTRACTION Right   . CRANIOTOMY Right 11/06/2012   Procedure: CRANIOTOMY TUMOR EXCISION;  Surgeon: Erline Levine, MD;  Location: [facility] [facility];  Service: Neurosurgery;  Laterality: Right;  Right Parasagittal craniotomy for meningioma with Stealth  . ESOPHAGOGASTRODUODENOSCOPY    . right knee arthroscopy      There were no vitals filed for this visit.   Subjective Assessment - 03/15/18 1516    Subjective  Pt relays he had Rt THA on 02/20/18. He has been doing well, pain levels have been low and he is not using AD for ambulation.    Pertinent History  DISH, prev Rt knee scope,     Limitations  Sitting;Standing;Walking    Patient Stated Goals  Get back to work    Currently in Pain?  Yes    Pain Score  2     Pain Location  Hip    Pain Orientation  Right    Pain Descriptors / Indicators  Dull    Pain Onset  1 to 4 weeks ago    Pain Frequency  Intermittent    Aggravating Factors   laying on Rt side    Pain Relieving Factors  Resting off Rt side    Multiple Pain Sites  No         [facility] PT Assessment - 03/15/18 0001      Assessment   Medical Diagnosis  Rt THA  posterior     Referring Provider  Elveria Royals    Onset Date/Surgical Date  02/20/18    Next MD Visit  End of sept.    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    Leisure  fish      Cognition   Overall Cognitive Status  Within Functional Limits for tasks assessed      Observation/Other Assessments   Focus on Therapeutic Outcomes (FOTO)   56%limited      Sensation   Light Touch  Appears Intact      ROM /  Strength   AROM / PROM / Strength  AROM;Strength;PROM      AROM   Overall AROM Comments  WFL for post op precautions      PROM   Overall PROM Comments  WFL to post op precautions      Strength   Overall Strength Comments  Rt knee and ankle 5/5    Strength Assessment Site  Hip    Right/Left Hip  Right    Right Hip Flexion  3+/5    Right Hip Extension  3+/5    Right Hip External Rotation   4/5    Right Hip Internal Rotation  4/5    Right Hip ABduction  4-/5    Right Hip ADduction  --   not tested     Flexibility   Soft Tissue Assessment /Muscle Length  --   tight H.S Rt     Palpation   Palpation comment  TTP around incision      Ambulation/Gait   Gait Comments  no AD used, slight antalgic gait on Rt, good balance                Objective measurements completed on examination: See above findings.      [place] Adult PT Treatment/Exercise - 03/15/18 0001      Exercises   Exercises  Knee/Hip      Knee/Hip Exercises: Stretches   Passive Hamstring Stretch  Right;1 rep;30 seconds      Knee/Hip Exercises: Aerobic   Nustep  consider next session dont flex past 90      Knee/Hip  Exercises: Seated   Sit to Sand  --   3 reps     Knee/Hip Exercises: Supine   Straight Leg Raises  Right;5 reps      Knee/Hip Exercises: Sidelying   Hip ABduction  Right;5 reps             PT Education - 03/15/18 1526    Education Details  HEP, POC    Person(s) Educated  Patient    Methods  Explanation;Demonstration;Verbal cues;Handout    Comprehension  Verbalized understanding;Need further instruction          PT Long Term Goals - 03/15/18 1536      PT LONG TERM GOAL #1   Title  Pt will be I and compliant with HEP. 6 weeks 04/26/18    Status  New      PT LONG TERM GOAL #2   Title  Pt will increase Rt hip strength to 5/5 MMT. 6 weeks 04/26/18    Status  New      PT LONG TERM GOAL #3   Title  Pt will improve Rt hip ROM to [facility]. 6 weeks 04/26/18      PT LONG TERM GOAL #4   Title  Pt will improve FOTO to less than 41% limited to show improved function. 6 weeks 04/26/18      PT LONG TERM GOAL #5   Title  Pt will have no more than 2/10 pain while returning to normal activiites. 6 weeks 04/26/18    Status  New             Plan - 03/15/18 1530    Clinical Impression Statement  Pt presents with Rt hip pain and stiffness S/P THA (posterior hip precautions) on 02/20/18. He is doing quite well, has low pain levels, and is already ambulating safely without AD. He has ROM to his post op precautions but overall decreased ROM,  decreased strength, decreased overall standing tolerance and increased pain. He will benefit from PT to address these deficits. Incsion site looks well healing with no signs of infection.    History and Personal Factors relevant to plan of care:  DISH    Clinical Presentation  Stable    Clinical Decision Making  Low    Rehab Potential  Excellent    PT Frequency  2x / week    PT Duration  6 weeks    PT Treatment/Interventions  Cryotherapy;English as a second language teacher;Therapeutic activities;Therapeutic  exercise;Neuromuscular re-education;Manual techniques;Passive range of motion;Dry needling;Taping    PT Next Visit Plan  review HEP, ROM and strength, progress WB activity and strength    Consulted and Agree with Plan of Care  Patient       Patient will benefit from skilled therapeutic intervention in order to improve the following deficits and impairments:  Abnormal gait, Decreased activity tolerance, Decreased endurance, Decreased range of motion, Decreased strength, Difficulty walking, Impaired flexibility, Pain  Visit Diagnosis: Pain in right hip  Stiffness of right hip, not elsewhere classified     Problem List Patient Active Problem List   Diagnosis Date Noted  . Diffuse idiopathic skeletal hyperostosis 06/05/2013    Debbe Odea, PT, DPT 03/15/2018, 3:41 PM  [facility] [address] [place], [place], [address] Phone: [phone]   Fax:  [phone]  Name: CRESTON KLAS MRN: [id number] Date of Birth: 05/01/1961

## 2018-03-26 ENCOUNTER — Ambulatory Visit: Payer: Commercial Managed Care - PPO

## 2018-03-29 ENCOUNTER — Ambulatory Visit: Payer: Commercial Managed Care - PPO | Attending: Orthopedic Surgery

## 2018-03-29 DIAGNOSIS — M25551 Pain in right hip: Secondary | ICD-10-CM

## 2018-03-29 DIAGNOSIS — M25651 Stiffness of right hip, not elsewhere classified: Secondary | ICD-10-CM

## 2018-03-29 NOTE — Therapy (Signed)
[place], [place], [address] Phone: [phone]   Fax:  [phone]  Physical Therapy Treatment  Patient Details  Name: Joshua [name] MRN: [id number] Date of Birth: 02-Oct-1960 Referring Provider: Elveria Royals   Encounter Date: 03/29/2018  PT End of Session - 03/29/18 0850    Visit Number  2    Number of Visits  12    PT Start Time  0846    PT Stop Time  0935    PT Time Calculation (min)  49 min    Activity Tolerance  Patient tolerated treatment well    Behavior During Therapy  [facility] for tasks assessed/performed       Past Medical History:  Diagnosis Date  . Brain tumor ([place]) 02/20/2013   brain tumor removed in March 2014, Dr Donald Pore  . Dizziness   . Enlarged prostate   . Headache(784.0)    scattered  . Meningioma [facility])     Past Surgical History:  Procedure Laterality Date  . CATARACT EXTRACTION Right   . CRANIOTOMY Right 11/06/2012   Procedure: CRANIOTOMY TUMOR EXCISION;  Surgeon: Erline Levine, MD;  Location: [place] [facility];  Service: Neurosurgery;  Laterality: Right;  Right Parasagittal craniotomy for meningioma with Stealth  . ESOPHAGOGASTRODUODENOSCOPY    . right knee arthroscopy      There were no vitals filed for this visit.  Subjective Assessment - 03/29/18 0851    Subjective  He reports can't tie shoe and still feels hip not right. Sore. After asked he reports still on precautions  no hip flexion past  90 degrees     Pain Score  3     Pain Location  Hip    Pain Orientation  Right    Pain Descriptors / Indicators  Dull;Sore    Pain Type  Surgical pain    Pain Onset  More than a month ago    Pain Frequency  Intermittent    Aggravating Factors   Lye LT side    Pain Relieving Factors  off RT side                       [facility] Adult PT Treatment/Exercise - 03/29/18 0001      Knee/Hip Exercises: Standing   Forward Step Up  Right;Hand Hold: 2;Step Height: 8";15 reps       Knee/Hip Exercises: Supine   Heel Slides  Right;15 reps    Bridges  Both;15 reps    Straight Leg Raises  Right;10 reps      Knee/Hip Exercises: Sidelying   Hip ABduction  Right;15 reps   pillowed between knees   Clams  RT x 15      Modalities   Modalities  Moist Heat      Moist Heat Therapy   Number Minutes Moist Heat  10 Minutes    Moist Heat Location  Hip       Nustep L3 7 min   counter squats x 20, side steps x 15 RT LT , hip abduction x 15 RT/Lt     Discussed posterior hip precautions and it appears he understands tense and is compliant.      PT Long Term Goals - 03/15/18 1536      PT LONG TERM GOAL #1   Title  Pt will be I and compliant with HEP. 6 weeks 04/26/18    Status  New      PT LONG TERM GOAL #2  Title  Pt will increase Rt hip strength to 5/5 MMT. 6 weeks 04/26/18    Status  New      PT LONG TERM GOAL #3   Title  Pt will improve Rt hip ROM to [facility]. 6 weeks 04/26/18      PT LONG TERM GOAL #4   Title  Pt will improve FOTO to less than 41% limited to show improved function. 6 weeks 04/26/18      PT LONG TERM GOAL #5   Title  Pt will have no more than 2/10 pain while returning to normal activiites. 6 weeks 04/26/18    Status  New            Plan - 03/29/18 0857    Clinical Impression Statement  No specific reason not here past 2 weeks. His report of limits are to be expected as he is only 5-6 weeks out from posterior THA.  Continue to work on strength    PT Treatment/Interventions  Cryotherapy;English as a second language teacher;Therapeutic activities;Therapeutic exercise;Neuromuscular re-education;Manual techniques;Passive range of motion;Dry needling;Taping    PT Next Visit Plan  review HEP, ROM and strength, progress WB activity and strength    PT Home Exercise Plan  counter squats, single leg balance, SLR side and supine,     Consulted and Agree with Plan of Care  Patient       Patient will  benefit from skilled therapeutic intervention in order to improve the following deficits and impairments:  Abnormal gait, Decreased activity tolerance, Decreased endurance, Decreased range of motion, Decreased strength, Difficulty walking, Impaired flexibility, Pain  Visit Diagnosis: Pain in right hip  Stiffness of right hip, not elsewhere classified     Problem List Patient Active Problem List   Diagnosis Date Noted  . Diffuse idiopathic skeletal hyperostosis 06/05/2013    Joshua [name]  PT 03/29/2018, 9:27 AM  [facility] [address] [place], [place], [address] Phone: [phone]   Fax:  [phone]  Name: Joshua [name] MRN: [id number] Date of Birth: [birth year]

## 2018-04-03 ENCOUNTER — Telehealth: Payer: Self-pay | Admitting: Physical Therapy

## 2018-04-03 ENCOUNTER — Ambulatory Visit: Payer: Commercial Managed Care - PPO | Admitting: Physical Therapy

## 2018-04-03 NOTE — Telephone Encounter (Signed)
Left message about missed visit on voicemail.  Date and time of next visit was given as well as our phone number to call if he was no longer interested in PT,  or if he was unable to make the appointment.  I asked him to refer  to the policy for missed visits. Melvenia Needles PTA

## 2018-04-09 ENCOUNTER — Encounter: Payer: Self-pay | Admitting: Physical Therapy

## 2018-04-09 ENCOUNTER — Ambulatory Visit: Payer: Commercial Managed Care - PPO

## 2018-04-09 DIAGNOSIS — M25551 Pain in right hip: Secondary | ICD-10-CM | POA: Diagnosis not present

## 2018-04-09 DIAGNOSIS — M25651 Stiffness of right hip, not elsewhere classified: Secondary | ICD-10-CM

## 2018-04-09 NOTE — Therapy (Addendum)
[place], [place], [address] Phone: [phone]   Fax:  [phone]  Physical Therapy Treatment/Discharge  Patient Details  Name: Joshua [name] MRN: [id number] Date of Birth: May 17, 1961 Referring Provider: Elveria [name]   Encounter Date: 04/09/2018  PT End of Session - 04/09/18 0846    Visit Number  3    Number of Visits  12    PT Start Time  8937    PT Stop Time  0842    PT Time Calculation (min)  47 min    Activity Tolerance  Patient tolerated treatment well;No increased pain    Behavior During Therapy  WFL for tasks assessed/performed       Past Medical History:  Diagnosis Date  . Brain tumor ([place]) 02/20/2013   brain tumor removed in March 2014, Joshua [name]  . Dizziness   . Enlarged prostate   . Headache(784.0)    scattered  . Meningioma [facility])     Past Surgical History:  Procedure Laterality Date  . CATARACT EXTRACTION Right   . CRANIOTOMY Right 11/06/2012   Procedure: CRANIOTOMY TUMOR EXCISION;  Surgeon: Joshua Levine, MD;  Location: [place] [facility];  Service: Neurosurgery;  Laterality: Right;  Right Parasagittal craniotomy for meningioma with Stealth  . ESOPHAGOGASTRODUODENOSCOPY    . right knee arthroscopy      There were no vitals filed for this visit.  Subjective Assessment - 04/09/18 0803    Subjective  No real pain just feels bulky.     Currently in Pain?  Yes    Pain Score  2     Pain Location  Buttocks    Pain Orientation  Right    Pain Descriptors / Indicators  Aching    Pain Onset  More than a month ago    Pain Frequency  Intermittent    Aggravating Factors   RT side lye    Pain Relieving Factors  off RT side                       [facility] Adult PT Treatment/Exercise - 04/09/18 0001      Knee/Hip Exercises: Aerobic   Nustep  L5 LE only 5 min      Knee/Hip Exercises: Standing   Heel Raises  20 reps;Both    Forward Step Up  Right;15 reps;Hand Hold: 1;Step  Height: 8"    Step Down  Right;15 reps;Hand Hold: 1;Step Height: 4"    Other Standing Knee Exercises  side steps with control LT and RT with deliberate weight shifting , cued for touch for balance . Walked backward and marching for strength and balance.       Knee/Hip Exercises: Supine   Heel Slides  Right;15 reps    Bridges  Both;20 reps    Straight Leg Raises  Right;2 sets;10 reps      Manual Therapy   Manual Therapy  Soft tissue mobilization    Soft tissue mobilization  with tennis ball to gluteals.              PT Education - 04/09/18 0846    Education Details  controlled side step RT /Lt with delierate weight shifting x 10 1x/day    Person(s) Educated  Patient    Methods  Explanation;Tactile cues;Demonstration;Verbal cues    Comprehension  Verbalized understanding;Returned demonstration          PT Long Term Goals - 04/09/18 0848      PT LONG  TERM GOAL #1   Title  Pt will be I and compliant with HEP. 6 weeks 04/26/18    Status  On-going      PT LONG TERM GOAL #2   Title  Pt will increase Rt hip strength to 5/5 MMT. 6 weeks 04/26/18    Status  Unable to assess      PT LONG TERM GOAL #3   Title  Pt will improve Rt hip ROM to [facility]. 6 weeks 04/26/18    Status  Unable to assess      PT LONG TERM GOAL #4   Title  Pt will improve FOTO to less than 41% limited to show improved function. 6 weeks 04/26/18    Status  Unable to assess      PT LONG TERM GOAL #5   Title  Pt will have no more than 2/10 pain while returning to normal activiites. 6 weeks 04/26/18    Status  On-going            Plan - 04/09/18 0847    Clinical Impression Statement  He reports feeling better post  STW and he did well with all exercies without instability though close supervision with marching  back wallking    PT Treatment/Interventions  Cryotherapy;English as a second language teacher;Therapeutic activities;Therapeutic exercise;Neuromuscular  re-education;Manual techniques;Passive range of motion;Dry needling;Taping    PT Next Visit Plan  , ROM and strength, progress WB activity and strength    PT Home Exercise Plan  counter squats, single leg balance, SLR side and supine, , side steping    Consulted and Agree with Plan of Care  Patient       Patient will benefit from skilled therapeutic intervention in order to improve the following deficits and impairments:  Abnormal gait, Decreased activity tolerance, Decreased endurance, Decreased range of motion, Decreased strength, Difficulty walking, Impaired flexibility, Pain  Visit Diagnosis: Pain in right hip  Stiffness of right hip, not elsewhere classified     Problem List Patient Active Problem List   Diagnosis Date Noted  . Diffuse idiopathic skeletal hyperostosis 06/05/2013    Joshua [name]  PT 04/09/2018, 8:49 AM  [facility] [address] [place], [place], [address] Phone: [phone]   Fax:  [phone]  Name: Joshua [name] MRN: [id number] Date of Birth: [birth year] PHYSICAL THERAPY DISCHARGE SUMMARY  Visits from Start of Care: 3  Current functional level related to goals / functional outcomes: See above . Unknown as he did not return after this session. He no showed most of the appointments and no showed today.   A message was left with the Orthopedic office on the PT order for info on how to direct Joshua [name] Discharge Summary to Joshua [name] Remaining deficits: Unknown   Education / Equipment: HEP Plan:                                                    Patient goals were not met. Patient is being discharged due to not returning since the last visit.  ?????    Joshua [name] PT       05/17/18

## 2018-04-12 ENCOUNTER — Ambulatory Visit: Payer: Commercial Managed Care - PPO | Admitting: Physical Therapy

## 2018-04-16 ENCOUNTER — Encounter: Payer: Self-pay | Admitting: Physical Therapy

## 2018-04-23 ENCOUNTER — Telehealth: Payer: Self-pay | Admitting: Physical Therapy

## 2018-04-23 ENCOUNTER — Ambulatory Visit: Payer: Commercial Managed Care - PPO | Attending: Orthopedic Surgery | Admitting: Physical Therapy

## 2018-04-23 NOTE — Telephone Encounter (Signed)
Left message on voicemail about 2nd missed visit.  Date and time of next appointment given.  Phone number given for patient to call if he is unable to attend or if he no longer needs PT.  Informed patient that he will be able to schedule one appointment at a time after next appointment as due to our policy, appointments other than the next will be cancelled.  Melvenia Needles PTA

## 2018-04-26 ENCOUNTER — Ambulatory Visit: Payer: Commercial Managed Care - PPO | Admitting: Physical Therapy

## 2018-04-30 ENCOUNTER — Ambulatory Visit: Payer: Commercial Managed Care - PPO

## 2018-05-07 ENCOUNTER — Ambulatory Visit: Payer: Commercial Managed Care - PPO | Admitting: Physical Therapy

## 2018-05-10 ENCOUNTER — Ambulatory Visit: Payer: Commercial Managed Care - PPO | Admitting: Physical Therapy

## 2018-05-14 ENCOUNTER — Ambulatory Visit: Payer: Commercial Managed Care - PPO

## 2018-05-17 ENCOUNTER — Telehealth: Payer: Self-pay | Admitting: Physical Therapy

## 2018-05-17 ENCOUNTER — Ambulatory Visit: Payer: Commercial Managed Care - PPO

## 2018-05-17 NOTE — Telephone Encounter (Signed)
Message left about missed appointment today and discharge today and need to return to MD if he wants to return to PT.  Clinic number given for questions.

## 2018-10-30 ENCOUNTER — Emergency Department (HOSPITAL_COMMUNITY): Payer: Self-pay

## 2018-10-30 ENCOUNTER — Encounter (HOSPITAL_COMMUNITY): Payer: Self-pay | Admitting: Emergency Medicine

## 2018-10-30 ENCOUNTER — Emergency Department (HOSPITAL_COMMUNITY)
Admission: EM | Admit: 2018-10-30 | Discharge: 2018-10-30 | Disposition: A | Discharge status: Home / Self Care | Payer: Self-pay | Attending: Emergency Medicine | Admitting: Emergency Medicine

## 2018-10-30 ENCOUNTER — Other Ambulatory Visit: Payer: Self-pay

## 2018-10-30 DIAGNOSIS — R059 Cough, unspecified: Secondary | ICD-10-CM

## 2018-10-30 DIAGNOSIS — Z7982 Long term (current) use of aspirin: Secondary | ICD-10-CM | POA: Insufficient documentation

## 2018-10-30 DIAGNOSIS — I700 Atherosclerosis of aorta: Secondary | ICD-10-CM

## 2018-10-30 DIAGNOSIS — R062 Wheezing: Secondary | ICD-10-CM | POA: Insufficient documentation

## 2018-10-30 DIAGNOSIS — R05 Cough: Secondary | ICD-10-CM | POA: Insufficient documentation

## 2018-10-30 DIAGNOSIS — Z79899 Other long term (current) drug therapy: Secondary | ICD-10-CM | POA: Insufficient documentation

## 2018-10-30 DIAGNOSIS — F17210 Nicotine dependence, cigarettes, uncomplicated: Secondary | ICD-10-CM | POA: Insufficient documentation

## 2018-10-30 MED ORDER — DOXYCYCLINE HYCLATE 100 MG PO CAPS: 100.0000 mg | ORAL_CAPSULE | Freq: Two times a day (BID) | ORAL | 0 refills | Status: AC

## 2018-10-30 MED ORDER — PREDNISONE 10 MG PO TABS: 20.0000 mg | ORAL_TABLET | Freq: Two times a day (BID) | ORAL | 0 refills | Status: AC

## 2018-10-30 MED ORDER — AZITHROMYCIN 250 MG PO TABS: 250.0000 mg | ORAL_TABLET | Freq: Every day | ORAL | 0 refills | Status: DC

## 2018-10-30 MED ORDER — ALBUTEROL SULFATE HFA 108 (90 BASE) MCG/ACT IN AERS
1.0000 | INHALATION_SPRAY | Freq: Once | RESPIRATORY_TRACT | Status: AC
Start: 2018-10-30 — End: 2018-10-30
  Administered 2018-10-30: 1 via RESPIRATORY_TRACT
  Filled 2018-10-30: qty 6.7

## 2018-10-30 NOTE — ED Triage Notes (Signed)
Pt states he has had a cough and sore throat for 3 days. He was coughing up some yellow mucous, but that has stopped. Pt denies fever. Denies being around anyone that has been sick. No recent travel.

## 2018-10-30 NOTE — Discharge Instructions (Signed)
Please see the information and instructions below regarding your visit.  Your diagnoses today include:  1. Wheeze   2. Cough     You are diagnosed today with a viral respiratory illness. One of the major respiratory illnesses of concern circulating in our community is COVID-19. The Novel Coronavirus 2019, was first reported on in [place], [place] in late December 2019.  The outbreak was declared a public health emergency of international concern in January 2020 and on March 11th, 2020, the outbreak was declared a global pandemic.  The spread of this virus is now global with lots of media attention.  The virus has been named SARS-CoV-2 and the disease it causes has become known as coronavirus disease 2019 (COVID-19). Although symptoms can be variable, the predominant symptoms are fever, cough, and shortness of breath.   The incubation period (period during which the patient has the disease and can spread it to others but does not have symptoms of disease) is from 5-14 days.   [place] is considered to have "community spread" of COVID-19. This means that we are seeing cases of COVID-19 that we cannot connect to other known positive cases. Due to lack of testing supplies, we do not have the ability to test all patients, so we implement stringent home quarantine protocols to reduce the spread of disease. Please refer to the information below on Home Care and from the [facility] to guide you on keeping your family safe and returning to normal activity.  Even if you symptoms are not consistent with COVID-19, we are recommending EVERYONE with respiratory symptoms take increased precautions.   Tests performed today include: See side panel of your discharge paperwork for testing performed today. Vital signs are listed at the bottom of these instructions.   Medications prescribed:    Take any prescribed medications only as prescribed, and any over the counter medications  only as directed on the packaging.  Please use her albuterol inhaler 1 to 2 puffs every 4-6 hours as needed over the next 5 days for wheezing or cough.  Doxycycline is an antibiotic that fights infection in the the lungs. This medication can make your skin sensitive to the sun, so please ensure that you wear sunscreen, hats, or other coverage over your skin while taking this. This medicine CANNOT be taken by women while pregnant, breastfeeding, or trying to become pregnant.  Please speak with a healthcare provider if any of these situations apply to you.  Please take prednisone 20 mg twice daily for 5 days. You are prescribed prednisone, a steroid. This is a medication to help reduce inflammation in the lungs.  Common side effects include upset stomach/nausea. You may take this medicine with food if this occurs. Other side effects include restlessness, difficulty sleeping, and increased sweating. Call your healthcare provider if these do not resolve after finishing the medication.  This medicine may increase your blood sugar so additional careful monitoring is needed of blood sugar if you have diabetes. Call your healthcare provider for any signs/symtpoms of high blood sugar such as confusion, feeling sleepy, more thirst, more hunger, passing urine more often, flushing, fast breathing, or breath that smells like fruit.  Home care instructions:  Please follow any educational materials contained in this packet.   Since we do not have the ability to widely test patients for COVID-19, the Centers for Disease Control and Prevention (CDC) recommend the following guidelines currently for self-quarantine during a respiratory illness:   -  At least 3 days (72 hours) have passed since recovery defined as resolution of fever without the use of fever-reducing medications and improvement in respiratory symptoms (e.g., cough, shortness of breath); and, -At least 7 days have passed since symptoms first  appeared.  Your illness is contagious and can be spread to others, especially during the phase of illness during which you have a fever. It cannot be cured by antibiotics or other medicines. Take basic precautions such as washing your hands often, covering your mouth when you cough or sneeze, and avoiding public places where you could spread your illness to others.   Please continue drinking plenty of fluids.  Use over-the-counter medicines as needed as directed on packaging for symptom relief.  You may also use ibuprofen or tylenol as directed on packaging for pain or fever.  Do not take multiple medicines containing Tylenol or acetaminophen to avoid taking too much of this medication.  Follow-up instructions:   Return instructions:  Please return to the Emergency Department if you experience worsening symptoms.  RETURN IMMEDIATELY IF you develop shortness of breath, chest pain, confusion or altered mental status, a new rash, become dizzy, faint, or poorly responsive, or are unable to be cared for at home. Some patients develop bacterial pneumonia as a result of viral respiratory infections. Signs and symptoms include return of fever, increased production of green or yellow phlegm, chest pain, shortness of breath. You need to be reevaluated if you experience these symptoms. Please return if you have persistent vomiting and cannot keep down fluids or develop a fever that is not controlled by Acetaminophen (Tylenol) or Ibuprofen (Motrin). Please return if you have any other emergent concerns.   Additional Information:   Your vital signs today were: BP (!) 151/89 (BP Location: Right Arm)    Pulse 71    Temp 98 F (36.7 C) (Oral)    Resp 16    SpO2 97%  If your blood pressure (BP) was elevated on multiple readings during this visit above 130 for the top number or above 80 for the bottom number, please have this repeated by your primary care provider within one month. --------------  Thank you  for allowing [place] to participate in your care today.

## 2018-10-30 NOTE — ED Provider Notes (Addendum)
[facility] EMERGENCY DEPARTMENT Provider Note   CSN: [id number] Arrival date & time: 10/30/18  1437    History   Chief Complaint Chief Complaint  Patient presents with  . Sore Throat  . Cough    HPI Joshua [name] is a 58 y.o. male.     HPI   Patient is a 58 year old male with a past medical history of meningioma removed in 2014, headaches, large prostate, presenting for cough.  Patient reports that all the symptoms began 6 days ago with a mild sore throat.  He reports that the sore throat is nearly resolved.  He does report that over the past 6 days he has had ongoing cough with occasional sputum production.  Denies any significant shortness of breath or chest pain.  Denies fever or chills peer denies any congestion or rhinorrhea.  Patient was in a gathering almost a week ago at a bonfire.  No known sick contacts at that time.  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  He is concerned for his wife who has many medical problems and he does not want to expose her to illness.  Patient is a daily smoker.  Past Medical History:  Diagnosis Date  . Brain tumor ([place]) 02/20/2013   brain tumor removed in March 2014, Dr Donald Pore  . Dizziness   . Enlarged prostate   . Headache(784.0)    scattered  . Meningioma [facility])     Patient Active Problem List   Diagnosis Date Noted  . Diffuse idiopathic skeletal hyperostosis 06/05/2013    Past Surgical History:  Procedure Laterality Date  . CATARACT EXTRACTION Right   . CRANIOTOMY Right 11/06/2012   Procedure: CRANIOTOMY TUMOR EXCISION;  Surgeon: Erline Levine, MD;  Location: [facility];  Service: Neurosurgery;  Laterality: Right;  Right Parasagittal craniotomy for meningioma with Stealth  . ESOPHAGOGASTRODUODENOSCOPY    . right knee arthroscopy          Home Medications    Prior to Admission medications   Medication Sig Start Date End Date Taking?  Authorizing Provider  aspirin EC 81 MG tablet Take 81 mg by mouth daily.   Yes [provider]  ibuprofen (ADVIL,MOTRIN) 400 MG tablet Take 400 mg by mouth every 6 (six) hours as needed for mild pain.   Yes [provider]  docusate sodium (COLACE) 100 MG capsule Take 1 capsule (100 mg total) by mouth 2 (two) times daily as needed for mild constipation or moderate constipation. Patient not taking: Reported on 03/15/2018 08/07/14   Clayton Bibles, PA-C  hydrocortisone (ANUSOL-HC) 25 MG suppository Place 1 suppository (25 mg total) rectally 2 (two) times daily. Patient not taking: Reported on 03/15/2018 08/27/14   Gregor Hams, MD  methocarbamol (ROBAXIN) 500 MG tablet Take 1 tablet (500 mg total) by mouth 2 (two) times daily as needed for muscle spasms. Patient not taking: Reported on 03/15/2018 06/29/15   Sam, Olivia Canter, PA-C  mometasone (NASONEX) 50 MCG/ACT nasal spray Place 2 sprays into the nose daily. Patient not taking: Reported on 08/07/2014 12/19/12   Verl Dicker, PA-C  naproxen (NAPROSYN) 500 MG tablet Take 1 tablet (500 mg total) by mouth 2 (two) times daily. Patient not taking: Reported on 03/15/2018 12/15/15   Hyman Bible, PA-C  phenylephrine-shark liver oil-mineral oil-petrolatum (PREPARATION H) 0.25-3-14-71.9 % rectal ointment Place 1 application rectally 2 (two) times daily as needed for hemorrhoids.  [provider]  traMADol (ULTRAM) 50 MG tablet Take 1 tablet (50 mg total) by mouth every 6 (six) hours as needed. 12/15/15   Hyman Bible, PA-C  witch hazel-glycerin (TUCKS) pad Apply 1 application topically as needed for itching.    [provider]    Family History History reviewed. No pertinent family history.  Social History Social History   Tobacco Use  . Smoking status: Current Some Day Smoker    Types: Cigarettes  . Smokeless tobacco: Never Used  . Tobacco comment: hasn't smoked in a week  Substance Use Topics  . Alcohol use: Yes     Alcohol/week: 8.0 standard drinks    Types: 6 Cans of beer, 2 Shots of liquor per week    Comment: occasionally  . Drug use: No     Allergies   Patient has no known allergies.   Review of Systems Review of Systems  Constitutional: Negative for chills and fever.  HENT: Positive for sore throat. Negative for congestion, rhinorrhea, sinus pressure and sinus pain.   Respiratory: Positive for cough. Negative for chest tightness and shortness of breath.   Cardiovascular: Negative for chest pain.  Neurological: Negative for headaches.     Physical Exam Updated Vital Signs BP (!) 151/89 (BP Location: Right Arm)   Pulse 71   Temp 98 F (36.7 C) (Oral)   Resp 16   SpO2 97%   Physical Exam Vitals signs and nursing note reviewed.  Constitutional:      General: He is not in acute distress.    Appearance: He is well-developed. He is not diaphoretic.     Comments: Sitting comfortably in bed.  HENT:     Head: Normocephalic and atraumatic.     Mouth/Throat:     Comments: Normal phonation. No muffled voice sounds. Patient swallows secretions without difficulty. Dentition normal. No lesions of tongue or buccal mucosa. Uvula midline. No asymmetric swelling of the posterior pharynx. Minimal erythema of posterior pharynx. No tonsillar exuduate. No lingual swelling. No induration inferior to tongue. No submandibular tenderness, swelling, or induration.  Tissues of the neck supple. No cervical lymphadenopathy. Right TM without erythema or effusion; left TM without erythema or effusion.  Eyes:     General:        Right eye: No discharge.        Left eye: No discharge.     Conjunctiva/sclera: Conjunctivae normal.     Comments: EOMs normal to gross examination.  Neck:     Musculoskeletal: Normal range of motion.  Cardiovascular:     Rate and Rhythm: Normal rate and regular rhythm.     Heart sounds: Normal heart sounds.  Pulmonary:     Effort: Pulmonary effort is normal.     Breath  sounds: Wheezing present.     Comments: Normal effort. Speaking in full sentences. Soft wheezes in bilateral lower lung fields.  No rales auscultated. Abdominal:     General: There is no distension.  Musculoskeletal: Normal range of motion.  Skin:    General: Skin is warm and dry.  Neurological:     Mental Status: He is alert.     Comments: Cranial nerves intact to gross observation. Patient moves extremities without difficulty.  Psychiatric:        Behavior: Behavior normal.        Thought Content: Thought content normal.        Judgment: Judgment normal.      ED Treatments / Results  Labs (all labs  ordered are listed, but only abnormal results are displayed) Labs Reviewed - No data to display  EKG None  Radiology Dg Chest Portable 1 View  Result Date: 10/30/2018 CLINICAL DATA:  Cough and sore throat EXAM: PORTABLE CHEST 1 VIEW COMPARISON:  None. FINDINGS: Lungs are clear. Heart size and pulmonary vascularity are normal. No adenopathy. There is aortic atherosclerosis. There is degenerative change in the thoracic spine. IMPRESSION: No edema or consolidation. Heart size within normal limits. Aortic Atherosclerosis (ICD10-I70.0). Electronically Signed   By: Lowella Grip III M.D.   On: 10/30/2018 16:40    Procedures Procedures (including critical care time)  Medications Ordered in ED Medications  albuterol (PROVENTIL HFA;VENTOLIN HFA) 108 (90 Base) MCG/ACT inhaler 1 puff (has no administration in time range)     Initial Impression / Assessment and Plan / ED Course  I have reviewed the triage vital signs and the nursing notes.  Pertinent labs & imaging results that were available during my care of the patient were reviewed by me and considered in my medical decision making (see chart for details).        Patient is nontoxic-appearing, afebrile, speaking in full sentences, however he is exhibiting some soft wheezing on exam.  Pulse ox has remained above 97% on exam.   He is having increased sputum production.  He does not carry a diagnosis of asthma or COPD.  CXR without infiltrate but does show aortic atherosclerosis which patient is informed about. Will treat as new COPD exacerbation. Will cover with short course of doxycycline given increased sputum production.  Patient given course of prednisone for wheezing as well as instructed to use albuterol inhaler administered in the emergency department.  Return precautions given for any fevers, increasing shortness of breath.  Patient also instructed to stay out of work and self quarantine at home until coughing improves and is 7 days from symptom onset.   Joshua [name] was evaluated in Emergency Department on 10/30/2018 for the symptoms described in the history of present illness. He was evaluated in the context of the global COVID-19 pandemic, which necessitated consideration that the patient might be at risk for infection with the SARS-CoV-2 virus that causes COVID-19. Institutional protocols and algorithms that pertain to the evaluation of patients at risk for COVID-19 are in a state of rapid change based on information released by regulatory bodies including the CDC and federal and state organizations. These policies and algorithms were followed during the patient's care in the ED.  Final Clinical Impressions(s) / ED Diagnoses   Final diagnoses:  Wheeze  Cough  Aortic atherosclerosis ([place])       Tamala Julian 10/30/18 1918    Carmin Muskrat, MD 10/30/18 1927

## 2018-10-30 NOTE — ED Notes (Signed)
Patient verbalizes understanding of discharge instructions. Opportunity for questioning and answers were provided. Armband removed by staff, pt discharged from ED.  

## 2018-12-27 ENCOUNTER — Encounter (HOSPITAL_COMMUNITY): Payer: Self-pay | Admitting: Emergency Medicine

## 2018-12-27 ENCOUNTER — Inpatient Hospital Stay (HOSPITAL_COMMUNITY): Payer: Medicaid Other

## 2018-12-27 ENCOUNTER — Emergency Department (HOSPITAL_COMMUNITY): Payer: Medicaid Other

## 2018-12-27 ENCOUNTER — Inpatient Hospital Stay (HOSPITAL_COMMUNITY)
Admission: EM | Admit: 2018-12-27 | Discharge: 2018-12-31 | DRG: 054 | Disposition: A | Discharge status: Home / Self Care | Payer: Medicaid Other | Attending: Neurosurgery | Admitting: Neurosurgery

## 2018-12-27 DIAGNOSIS — F17210 Nicotine dependence, cigarettes, uncomplicated: Secondary | ICD-10-CM | POA: Diagnosis present

## 2018-12-27 DIAGNOSIS — R569 Unspecified convulsions: Secondary | ICD-10-CM | POA: Diagnosis present

## 2018-12-27 DIAGNOSIS — G8194 Hemiplegia, unspecified affecting left nondominant side: Secondary | ICD-10-CM | POA: Diagnosis present

## 2018-12-27 DIAGNOSIS — D496 Neoplasm of unspecified behavior of brain: Secondary | ICD-10-CM

## 2018-12-27 DIAGNOSIS — Z1159 Encounter for screening for other viral diseases: Secondary | ICD-10-CM | POA: Diagnosis not present

## 2018-12-27 DIAGNOSIS — G936 Cerebral edema: Secondary | ICD-10-CM | POA: Diagnosis present

## 2018-12-27 DIAGNOSIS — D320 Benign neoplasm of cerebral meninges: Principal | ICD-10-CM | POA: Diagnosis present

## 2018-12-27 DIAGNOSIS — K0889 Other specified disorders of teeth and supporting structures: Secondary | ICD-10-CM

## 2018-12-27 LAB — COMPREHENSIVE METABOLIC PANEL
ALT: 23 U/L (ref 0–44)
AST: 24 U/L (ref 15–41)
Albumin: 4 g/dL (ref 3.5–5.0)
Alkaline Phosphatase: 69 U/L (ref 38–126)
Anion gap: 14 (ref 5–15)
BUN: 11 mg/dL (ref 6–20)
CO2: 20 mmol/L — ABNORMAL LOW (ref 22–32)
Calcium: 9.3 mg/dL (ref 8.9–10.3)
Chloride: 110 mmol/L (ref 98–111)
Creatinine, Ser: 1.1 mg/dL (ref 0.61–1.24)
GFR calc Af Amer: >60 mL/min (ref >60)
GFR calc non Af Amer: >60 mL/min (ref >60)
Glucose, Bld: 96 mg/dL (ref 70–99)
Potassium: 4.8 mmol/L (ref 3.5–5.1)
Sodium: 144 mmol/L (ref 135–145)
Total Bilirubin: 0.7 mg/dL (ref 0.3–1.2)
Total Protein: 7.3 g/dL (ref 6.5–8.1)

## 2018-12-27 LAB — CBC
HCT: 51.1 % (ref 39.0–52.0)
Hemoglobin: 17 g/dL (ref 13.0–17.0)
MCH: 32.1 pg (ref 26.0–34.0)
MCHC: 33.3 g/dL (ref 30.0–36.0)
MCV: 96.4 fL (ref 80.0–100.0)
Platelets: 302 10*3/uL (ref 150–400)
RBC: 5.3 MIL/uL (ref 4.22–5.81)
RDW: 12.8 % (ref 11.5–15.5)
WBC: 9.1 10*3/uL (ref 4.0–10.5)
nRBC: 0 % (ref 0.0–0.2)

## 2018-12-27 LAB — SURGICAL PCR SCREEN
MRSA, PCR: NEGATIVE
Staphylococcus aureus: NEGATIVE

## 2018-12-27 LAB — SARS CORONAVIRUS 2: SARS Coronavirus 2: NOT DETECTED

## 2018-12-27 LAB — ETHANOL: Alcohol, Ethyl (B): <10 mg/dL (ref <10)

## 2018-12-27 LAB — CBG MONITORING, ED: Glucose-Capillary: 91 mg/dL (ref 70–99)

## 2018-12-27 MED ORDER — LEVETIRACETAM IN NACL 1000 MG/100ML IV SOLN
1000.0000 mg | Freq: Once | INTRAVENOUS | Status: AC
  Administered 2018-12-27: 1000 mg via INTRAVENOUS
  Filled 2018-12-27: qty 100

## 2018-12-27 MED ORDER — LORAZEPAM 2 MG/ML IJ SOLN
INTRAMUSCULAR | Status: AC
  Filled 2018-12-27: qty 1

## 2018-12-27 MED ORDER — THIAMINE HCL 100 MG/ML IJ SOLN
Freq: Once | INTRAVENOUS | Status: AC
  Administered 2018-12-27: 22:00:00 via INTRAVENOUS
  Filled 2018-12-27: qty 1000

## 2018-12-27 MED ORDER — GADOBUTROL 1 MMOL/ML IV SOLN
8.0000 mL | Freq: Once | INTRAVENOUS | Status: AC | PRN
  Administered 2018-12-27: 8 mL via INTRAVENOUS

## 2018-12-27 MED ORDER — LORAZEPAM 2 MG/ML IJ SOLN
1.0000 mg | INTRAMUSCULAR | Status: DC | PRN
  Administered 2018-12-28 – 2018-12-31 (×2): 1 mg via INTRAVENOUS
  Filled 2018-12-27 (×3): qty 1

## 2018-12-27 MED ORDER — ONDANSETRON HCL 4 MG/2ML IJ SOLN: 4.0000 mg | Freq: Four times a day (QID) | INTRAMUSCULAR | Status: DC | PRN

## 2018-12-27 MED ORDER — BISACODYL 10 MG RE SUPP: 10.0000 mg | Freq: Every day | RECTAL | Status: DC | PRN

## 2018-12-27 MED ORDER — IBUPROFEN 200 MG PO TABS: 400.0000 mg | ORAL_TABLET | Freq: Four times a day (QID) | ORAL | Status: DC | PRN

## 2018-12-27 MED ORDER — LEVETIRACETAM IN NACL 1000 MG/100ML IV SOLN
1000.0000 mg | Freq: Once | INTRAVENOUS | Status: AC
  Administered 2018-12-27: 13:00:00 1000 mg via INTRAVENOUS
  Filled 2018-12-27: qty 100

## 2018-12-27 MED ORDER — LEVETIRACETAM IN NACL 1000 MG/100ML IV SOLN
1000.0000 mg | Freq: Two times a day (BID) | INTRAVENOUS | Status: DC
  Administered 2018-12-27: 1000 mg via INTRAVENOUS
  Filled 2018-12-27: qty 100

## 2018-12-27 MED ORDER — POLYETHYLENE GLYCOL 3350 17 G PO PACK: 17.0000 g | PACK | Freq: Every day | ORAL | Status: DC | PRN

## 2018-12-27 MED ORDER — FLEET ENEMA 7-19 GM/118ML RE ENEM: 1.0000 | ENEMA | Freq: Once | RECTAL | Status: DC | PRN

## 2018-12-27 MED ORDER — ACETAMINOPHEN 325 MG PO TABS
650.0000 mg | ORAL_TABLET | Freq: Four times a day (QID) | ORAL | Status: DC | PRN
  Administered 2018-12-30: 650 mg via ORAL
  Filled 2018-12-27: qty 2

## 2018-12-27 MED ORDER — ACETAMINOPHEN 650 MG RE SUPP: 650.0000 mg | Freq: Four times a day (QID) | RECTAL | Status: DC | PRN

## 2018-12-27 MED ORDER — LORAZEPAM 2 MG/ML IJ SOLN
2.0000 mg | Freq: Once | INTRAMUSCULAR | Status: AC
  Administered 2018-12-27: 2 mg via INTRAVENOUS

## 2018-12-27 MED ORDER — DOCUSATE SODIUM 100 MG PO CAPS
100.0000 mg | ORAL_CAPSULE | Freq: Two times a day (BID) | ORAL | Status: DC
  Administered 2018-12-27 – 2018-12-31 (×8): 100 mg via ORAL
  Filled 2018-12-27 (×8): qty 1

## 2018-12-27 MED ORDER — SODIUM CHLORIDE 0.9 % IV SOLN: 3000.0000 mg | INTRAVENOUS | Status: DC

## 2018-12-27 MED ORDER — ONDANSETRON HCL 4 MG PO TABS: 4.0000 mg | ORAL_TABLET | Freq: Four times a day (QID) | ORAL | Status: DC | PRN

## 2018-12-27 NOTE — Consult Note (Signed)
Neurology Consultation Reason for Consult: Status epilepticus Referring Physician: Vickie Epley  CC: Status epilepticus  History is obtained from: Patient  HPI: Joshua [name] is a 58 y.o. male with a history of brain tumor who presented today with status epilepticus.  He began having left-sided jerking activity around 12:30 PM which continued until he arrived to the emergency department and was given Ativan and Keppra.  It stopped shortly after 5:30 PM.  Following cessation of the activity, he had another brief seizure lasting about 30 seconds and was given another dose of Keppra 1 g.  He states that he has never had any seizure-like activity like this before.  His tumor was excised in 2014, and he since became lost to follow-up.   ROS: A 14 point ROS was performed and is negative except as noted in the HPI.  Past Medical History:  Diagnosis Date  . Brain tumor ([place]) 02/20/2013   brain tumor removed in March 2014, Dr Donald Pore  . Dizziness   . Enlarged prostate   . Headache(784.0)    scattered  . Meningioma ([place])      Family history: No history of seizures   Social History:  reports that he has been smoking cigarettes. He has never used smokeless tobacco. He reports current alcohol use of about 8.0 standard drinks of alcohol per week. He reports that he does not use drugs.   Exam: Current vital signs: BP 124/76   Pulse (!) 119   Temp 99 F (37.2 C) (Oral)   Resp 16   SpO2 94%  Vital signs in last 24 hours: Temp:  [99 F (37.2 C)] 99 F (37.2 C) (06/11 1323) Pulse Rate:  [119-126] 119 (06/11 1330) Resp:  [16-38] 16 (06/11 1630) BP: (108-162)/(68-113) 124/76 (06/11 1630) SpO2:  [94 %-97 %] 94 % (06/11 1330)   Physical Exam  Constitutional: Appears well-developed and well-nourished.  Psych: Affect appropriate to situation Eyes: No scleral injection HENT: No OP obstrucion Head: Normocephalic.  Cardiovascular: Normal rate and regular rhythm.  Respiratory: Effort  normal, non-labored breathing GI: Soft.  No distension. There is no tenderness.  Skin: WDI  Neuro: Mental Status: Patient is awake, alert, oriented to person, place, month, year, and situation. Patient is able to give a clear and coherent history. No signs of aphasia, he does extinguish to double simultaneous stimulation on the left Cranial Nerves: II: Visual Fields are full. Pupils are equal, round, and reactive to light.   III,IV, VI: EOMI without ptosis or diploplia.  V: Facial sensation is symmetric to temperature VII: Facial movement with very mild left facial weakness VIII: hearing is intact to voice X: Uvula elevates symmetrically XI: Shoulder shrug is symmetric. XII: tongue is midline without atrophy or fasciculations.  Motor: Tone is normal. Bulk is normal. 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  It appears to have been broken with Ativan/Keppra.  If he has continued seizures, I would favor an additional 2 g of Keppra prior to another antiepileptic.  Recommendations: 1) Keppra 1 g twice daily, first  dose at 10 PM(already received 2 g) 2) Ativan as needed seizure activity 3) MRI brain, decisions regarding management of meningioma per neurosurgery  Roland Rack, MD [facility] [phone]  If 7pm- 7am, please page neurology on call as listed in [place].

## 2018-12-27 NOTE — ED Notes (Signed)
Attempted report 

## 2018-12-27 NOTE — ED Notes (Signed)
ED TO INPATIENT HANDOFF REPORT  ED Nurse Name and Phone #:  Mali [phone]  S Name/Age/Gender [name] 58 y.o. male Room/Bed: 016C/016C  Code Status   Code Status: Not on file  Home/SNF/Other Home {Patient oriented to Is this baseline? Yes   Triage Complete: Triage complete  Chief Complaint L sided tremor/ headache  Triage Note Pt here from work when he started having tremors on then left side and headache , onset was sudden, pt has history of benign brain tumor     Allergies No Known Allergies  Level of Care/Admitting Diagnosis ED Disposition    ED Disposition Condition [facility] Area: [place] [[id number]]  Level of Care: Med-Surg [16]  Covid Evaluation: Screening Protocol (No Symptoms)  Diagnosis: Brain tumor [facility]) [[id number]]  Admitting Physician: [place], [place]  Attending Physician: Erline Levine [[id number]]  Estimated length of stay: 3 - 4 days  Certification:: I certify this patient will need inpatient services for at least 2 midnights  Bed request comments: 4N PC  PT Class (Do Not Modify): Inpatient [101]  PT Acc Code (Do Not Modify): Private [1]       B Medical/Surgery History Past Medical History:  Diagnosis Date  . Brain tumor ([place]) 02/20/2013   brain tumor removed in March 2014, Dr Donald Pore  . Dizziness   . Enlarged prostate   . Headache(784.0)    scattered  . Meningioma [facility])    Past Surgical History:  Procedure Laterality Date  . CATARACT EXTRACTION Right   . CRANIOTOMY Right 11/06/2012   Procedure: CRANIOTOMY TUMOR EXCISION;  Surgeon: Erline Levine, MD;  Location: [place] [facility];  Service: Neurosurgery;  Laterality: Right;  Right Parasagittal craniotomy for meningioma with Stealth  . ESOPHAGOGASTRODUODENOSCOPY    . right knee arthroscopy       A IV Location/Drains/Wounds Patient Lines/Drains/Airways Status   Active Line/Drains/Airways    Name:   Placement date:   Placement time:   Site:   Days:    Peripheral IV 12/27/18   12/27/18    1329    -   less than 1   Incision 11/06/12 Head Right   11/06/12    0948     2242          Intake/Output Last 24 hours No intake or output data in the 24 hours ending 12/27/18 1810  Labs/Imaging Results for orders placed or performed during the hospital encounter of 12/27/18 (from the past 48 hour(s))  POC CBG, ED     Status: None   Collection Time: 12/27/18  1:14 PM  Result Value Ref Range   Glucose-Capillary 91 70 - 99 mg/dL  CBC     Status: None   Collection Time: 12/27/18  1:25 PM  Result Value Ref Range   WBC 9.1 4.0 - 10.5 K/uL   RBC 5.30 4.22 - 5.81 MIL/uL   Hemoglobin 17.0 13.0 - 17.0 g/dL   HCT 51.1 39.0 - 52.0 %   MCV 96.4 80.0 - 100.0 fL   MCH 32.1 26.0 - 34.0 pg   MCHC 33.3 30.0 - 36.0 g/dL   RDW 12.8 11.5 - 15.5 %   Platelets 302 150 - 400 K/uL   nRBC 0.0 0.0 - 0.2 %    Comment: Performed at [facility], [place] [address] [place], [place] [address]  Comprehensive metabolic panel     Status: Abnormal   Collection Time: 12/27/18  1:25 PM  Result Value Ref  Range   Sodium 144 135 - 145 mmol/L   Potassium 4.8 3.5 - 5.1 mmol/L   Chloride 110 98 - 111 mmol/L   CO2 20 (L) 22 - 32 mmol/L   Glucose, Bld 96 70 - 99 mg/dL   BUN 11 6 - 20 mg/dL   Creatinine, Ser 1.10 0.61 - 1.24 mg/dL   Calcium 9.3 8.9 - 10.3 mg/dL   Total Protein 7.3 6.5 - 8.1 g/dL   Albumin 4.0 3.5 - 5.0 g/dL   AST 24 15 - 41 U/L   ALT 23 0 - 44 U/L   Alkaline Phosphatase 69 38 - 126 U/L   Total Bilirubin 0.7 0.3 - 1.2 mg/dL   GFR calc non Af Amer >60 >60 mL/min   GFR calc Af Amer >60 >60 mL/min   Anion gap 14 5 - 15    Comment: Performed at [facility] [address], [place], [place] [address]  Ethanol     Status: None   Collection Time: 12/27/18  1:31 PM  Result Value Ref Range   Alcohol, Ethyl (B) <10 <10 mg/dL    Comment: (NOTE) Lowest detectable limit for serum alcohol is 10 mg/dL. For medical purposes only. Performed at [facility], [place] [address], [place], [place] [address]   SARS Coronavirus 2     Status: None   Collection Time: 12/27/18  3:21 PM  Result Value Ref Range   SARS Coronavirus 2 NOT DETECTED NOT DETECTED    Comment: (NOTE) SARS-CoV-2 target nucleic acids are NOT DETECTED. The SARS-CoV-2 RNA is generally detectable in upper and lower respiratory specimens during the acute phase of infection.  Negative  results do not preclude SARS-CoV-2 infection, do not rule out co-infections with other pathogens, and should not be used as the sole basis for treatment or other patient management decisions.  Negative results must be combined with clinical observations, patient history, and epidemiological information. The expected result is Not Detected. Fact Sheet for Patients: http://www.biofiredefense.com/wp-content/uploads/2020/03/BIOFIRE-COVID -19-patients.pdf Fact Sheet for Healthcare Providers: http://www.biofiredefense.com/wp-content/uploads/2020/03/BIOFIRE-COVID -19-hcp.pdf This test is not yet approved or cleared by the [place] and  has been authorized for detection and/or diagnosis of SARS-CoV-2 by FDA under an Emergency Use Authorization (EUA).  This EUA will remain in effec t (meaning this test can be used) for the duration of  the COVID-19 declaration under Section 564(b)(1) of the Act, 21 U.S.C. section 360bbb-3(b)(1), unless the authorization is terminated or revoked sooner. Performed at [facility], [place] [address], [place], [place] [address]    Ct Head Wo Contrast  Result Date: 12/27/2018 CLINICAL DATA:  Headache, tremors. EXAM: CT HEAD WITHOUT CONTRAST TECHNIQUE: Contiguous axial images were obtained from the base of the skull through the vertex without intravenous contrast. COMPARISON:  CT and MRI scan of September 21, 2012. FINDINGS: Brain: Ventricular size is within normal limits. There is no evidence of hemorrhage or acute infarction. Meningeal mass measuring 5.3 x 3.4  x 1.7 cm is seen predominantly in the right para falcine region superiorly which appears to be extending into or adjacent to superior sagittal sinus. This appears to be enlarged compared to prior exam. Vascular: No hyperdense vessel or unexpected calcification. Skull: Status post right superior parietal craniotomy. No acute fracture or osseous abnormality is noted. Sinuses/Orbits: No acute finding. Other: None. IMPRESSION: 5.3 x 3.4 x 1.7 cm dural based mass is noted predominantly in the right para falcine region superiorly which appears to be extending into or  adjacent to the superior sagittal sinus. This appears to be enlarged compared to the prior exam. Since the patient appears to have undergone prior craniotomy in this region, this is concerning for recurrent or residual neoplasm or meningioma. Further evaluation with MRI with and without gadolinium is recommended. Electronically Signed   By: Marijo Conception M.D.   On: 12/27/2018 14:42    Pending Labs Unresulted Labs (From admission, onward)    Start     Ordered   12/27/18 1521  SARS Coronavirus 2 (CEPHEID - Performed in [place] hospital lab), Hosp Order  (Asymptomatic Patients Labs)  Once,   STAT    Question:  Rule Out  Answer:  Yes   12/27/18 1520   12/27/18 1326  Rapid urine drug screen (hospital performed)  ONCE - STAT,   STAT     12/27/18 1326   Signed and Held  HIV antibody (Routine Testing)  Once,   R     Signed and Held          Vitals/Pain Today's Vitals   12/27/18 1530 12/27/18 1545 12/27/18 1600 12/27/18 1630  BP: 116/68 117/74 116/83 124/76  Pulse:      Resp: 17 19 19 16   Temp:      TempSrc:      SpO2:        Isolation Precautions No active isolations  Medications Medications  LORazepam (ATIVAN) 2 MG/ML injection (has no administration in time range)  LORazepam (ATIVAN) 2 MG/ML injection (has no administration in time range)  LORazepam (ATIVAN) injection 1 mg (has no administration in time range)   levETIRAcetam (KEPPRA) IVPB 1000 mg/100 mL premix (has no administration in time range)  LORazepam (ATIVAN) injection 2 mg (2 mg Intravenous Given 12/27/18 1326)  levETIRAcetam (KEPPRA) IVPB 1000 mg/100 mL premix (0 mg Intravenous Stopped 12/27/18 1412)  levETIRAcetam (KEPPRA) IVPB 1000 mg/100 mL premix (1,000 mg Intravenous New Bag/Given 12/27/18 1733)    Mobility walks Moderate fall risk   Focused Assessments Neuro Assessment Handoff:  Swallow screen pass? No          Neuro Assessment: Exceptions to WDL Neuro Checks:      Last Documented NIHSS Modified Score:   Has TPA been given? No If patient is a Neuro Trauma and patient is going to OR before floor call report to [place] nurse: [phone] or [phone]     R Recommendations: See Admitting Provider Note  Report given to:   Additional Notes:

## 2018-12-27 NOTE — H&P (View-Only) (Signed)
Reason for Consult:seizures Referring Physician: Mekhi Sonn is an 58 y.o. male.  HPI: 58 year old male presents the acute onset of left-sided jerks and twitches.  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  States he had numbness to his left lower extremity was then began to jerk and twitch followed by mild headache as well as left upper extremity twitching.  Denies any emesis.  No history of trauma.  No prior history of same.  No treatment use prior to arrival.  The patient was lost to follow up after his surgery and did not return for follow up imaging. He had some left leg weakness postoperatively, but this improved and he returned to work.  He works Medical illustrator.  He had focal seizures on the left side with what appeared to be a Jacksonian March up his left leg.  These have continued throughout the day, despite Keppra and Ativan.     Past Medical History:  Diagnosis Date  . Brain tumor ([place]) 02/20/2013   brain tumor removed in March 2014, Dr Donald Pore  . Dizziness   . Enlarged prostate   . Headache(784.0)    scattered  . Meningioma [facility])     Past Surgical History:  Procedure Laterality Date  . CATARACT EXTRACTION Right   . CRANIOTOMY Right 11/06/2012   Procedure: CRANIOTOMY TUMOR EXCISION;  Surgeon: Erline Levine, MD;  Location: [place] [facility];  Service: Neurosurgery;  Laterality: Right;  Right Parasagittal craniotomy for meningioma with Stealth  . ESOPHAGOGASTRODUODENOSCOPY    . right knee arthroscopy      History reviewed. No pertinent family history.  Social History:  reports that he has been smoking cigarettes. He has never used smokeless tobacco. He reports current alcohol use of about 8.0 standard drinks of alcohol per week. He reports that he does not use drugs.  Allergies: No Known Allergies  Medications: I have reviewed the patient's current medications.  Results for orders placed or performed during the hospital encounter  of 12/27/18 (from the past 48 hour(s))  POC CBG, ED     Status: None   Collection Time: 12/27/18  1:14 PM  Result Value Ref Range   Glucose-Capillary 91 70 - 99 mg/dL  CBC     Status: None   Collection Time: 12/27/18  1:25 PM  Result Value Ref Range   WBC 9.1 4.0 - 10.5 K/uL   RBC 5.30 4.22 - 5.81 MIL/uL   Hemoglobin 17.0 13.0 - 17.0 g/dL   HCT 51.1 39.0 - 52.0 %   MCV 96.4 80.0 - 100.0 fL   MCH 32.1 26.0 - 34.0 pg   MCHC 33.3 30.0 - 36.0 g/dL   RDW 12.8 11.5 - 15.5 %   Platelets 302 150 - 400 K/uL   nRBC 0.0 0.0 - 0.2 %    Comment: Performed at [facility], [place] [address] [place], [place] [address]  Comprehensive metabolic panel     Status: Abnormal   Collection Time: 12/27/18  1:25 PM  Result Value Ref Range   Sodium 144 135 - 145 mmol/L   Potassium 4.8 3.5 - 5.1 mmol/L   Chloride 110 98 - 111 mmol/L   CO2 20 (L) 22 - 32 mmol/L   Glucose, Bld 96 70 - 99 mg/dL   BUN 11 6 - 20 mg/dL   Creatinine, Ser 1.10 0.61 - 1.24 mg/dL   Calcium 9.3 8.9 - 10.3 mg/dL   Total Protein 7.3 6.5 - 8.1  g/dL   Albumin 4.0 3.5 - 5.0 g/dL   AST 24 15 - 41 U/L   ALT 23 0 - 44 U/L   Alkaline Phosphatase 69 38 - 126 U/L   Total Bilirubin 0.7 0.3 - 1.2 mg/dL   GFR calc non Af Amer >60 >60 mL/min   GFR calc Af Amer >60 >60 mL/min   Anion gap 14 5 - 15    Comment: Performed at [facility] [address] [place], [place] [address]  Ethanol     Status: None   Collection Time: 12/27/18  1:31 PM  Result Value Ref Range   Alcohol, Ethyl (B) <10 <10 mg/dL    Comment: (NOTE) Lowest detectable limit for serum alcohol is 10 mg/dL. For medical purposes only. Performed at [facility], [place] [address] [place], [place] [address]     Ct Head Wo Contrast  Result Date: 12/27/2018 CLINICAL DATA:  Headache, tremors. EXAM: CT HEAD WITHOUT CONTRAST TECHNIQUE: Contiguous axial images were obtained from the base of the skull through the vertex without intravenous contrast. COMPARISON:  CT  and MRI scan of September 21, 2012. FINDINGS: Brain: Ventricular size is within normal limits. There is no evidence of hemorrhage or acute infarction. Meningeal mass measuring 5.3 x 3.4 x 1.7 cm is seen predominantly in the right para falcine region superiorly which appears to be extending into or adjacent to superior sagittal sinus. This appears to be enlarged compared to prior exam. Vascular: No hyperdense vessel or unexpected calcification. Skull: Status post right superior parietal craniotomy. No acute fracture or osseous abnormality is noted. Sinuses/Orbits: No acute finding. Other: None. IMPRESSION: 5.3 x 3.4 x 1.7 cm dural based mass is noted predominantly in the right para falcine region superiorly which appears to be extending into or adjacent to the superior sagittal sinus. This appears to be enlarged compared to the prior exam. Since the patient appears to have undergone prior craniotomy in this region, this is concerning for recurrent or residual neoplasm or meningioma. Further evaluation with MRI with and without gadolinium is recommended. Electronically Signed   By: Marijo Conception M.D.   On: 12/27/2018 14:42    Review of Systems - Negative except per HPI    Blood pressure 114/75, pulse (!) 119, temperature 99 F (37.2 C), temperature source Oral, resp. rate (!) 31, SpO2 94 %. Physical Exam  Constitutional: He is oriented to person, place, and time. He appears well-developed and well-nourished.  HENT:  Head: Normocephalic.  Eyes: Pupils are equal, round, and reactive to light. Conjunctivae and EOM are normal.  Neck: Normal range of motion. Neck supple.  Cardiovascular: Normal rate and regular rhythm.  Respiratory: Effort normal and breath sounds normal.  GI: Soft. Bowel sounds are normal.  Musculoskeletal: Normal range of motion.  Neurological: He is alert and oriented to person, place, and time. A sensory deficit is present. No cranial nerve deficit. He displays seizure activity. GCS eye  subscore is 4. GCS verbal subscore is 5. GCS motor subscore is 6.  Weak in left leg. Some weakness, but milder in left arm.   I witnessed seizure activity on the left (arm, abdomen and leg).  He was alert and responsive during seizures and was able to follow commands.    He has numbness in left leg.    Assessment/Plan: Patient with new onset focal seizures.  He had craniotomy and resection of right parasagittal meningioma in 10/2012 and was lost to follow up.  He  is being admitted for seizure control and brain MRI to assess recurrent tumor.  I consulted Dr. Leonel Ramsay for help managing his seizures.  Peggyann Shoals, MD 12/27/2018, 3:17 PM

## 2018-12-27 NOTE — Consult Note (Signed)
Reason for Consult:seizures Referring Physician: Corday Wyka is an 58 y.o. male.  HPI: 59 year old male presents the acute onset of left-sided jerks and twitches.  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  States he had numbness to his left lower extremity was then began to jerk and twitch followed by mild headache as well as left upper extremity twitching.  Denies any emesis.  No history of trauma.  No prior history of same.  No treatment use prior to arrival.  The patient was lost to follow up after his surgery and did not return for follow up imaging. He had some left leg weakness postoperatively, but this improved and he returned to work.  He works Medical illustrator.  He had focal seizures on the left side with what appeared to be a Jacksonian March up his left leg.  These have continued throughout the day, despite Keppra and Ativan.     Past Medical History:  Diagnosis Date  . Brain tumor ([place]) 02/20/2013   brain tumor removed in March 2014, Dr Donald Pore  . Dizziness   . Enlarged prostate   . Headache(784.0)    scattered  . Meningioma [facility])     Past Surgical History:  Procedure Laterality Date  . CATARACT EXTRACTION Right   . CRANIOTOMY Right 11/06/2012   Procedure: CRANIOTOMY TUMOR EXCISION;  Surgeon: Erline Levine, MD;  Location: [place] [facility];  Service: Neurosurgery;  Laterality: Right;  Right Parasagittal craniotomy for meningioma with Stealth  . ESOPHAGOGASTRODUODENOSCOPY    . right knee arthroscopy      History reviewed. No pertinent family history.  Social History:  reports that he has been smoking cigarettes. He has never used smokeless tobacco. He reports current alcohol use of about 8.0 standard drinks of alcohol per week. He reports that he does not use drugs.  Allergies: No Known Allergies  Medications: I have reviewed the patient's current medications.  Results for orders placed or performed during the hospital encounter  of 12/27/18 (from the past 48 hour(s))  POC CBG, ED     Status: None   Collection Time: 12/27/18  1:14 PM  Result Value Ref Range   Glucose-Capillary 91 70 - 99 mg/dL  CBC     Status: None   Collection Time: 12/27/18  1:25 PM  Result Value Ref Range   WBC 9.1 4.0 - 10.5 K/uL   RBC 5.30 4.22 - 5.81 MIL/uL   Hemoglobin 17.0 13.0 - 17.0 g/dL   HCT 51.1 39.0 - 52.0 %   MCV 96.4 80.0 - 100.0 fL   MCH 32.1 26.0 - 34.0 pg   MCHC 33.3 30.0 - 36.0 g/dL   RDW 12.8 11.5 - 15.5 %   Platelets 302 150 - 400 K/uL   nRBC 0.0 0.0 - 0.2 %    Comment: Performed at [facility], [place] [address], [place], [place] [address]  Comprehensive metabolic panel     Status: Abnormal   Collection Time: 12/27/18  1:25 PM  Result Value Ref Range   Sodium 144 135 - 145 mmol/L   Potassium 4.8 3.5 - 5.1 mmol/L   Chloride 110 98 - 111 mmol/L   CO2 20 (L) 22 - 32 mmol/L   Glucose, Bld 96 70 - 99 mg/dL   BUN 11 6 - 20 mg/dL   Creatinine, Ser 1.10 0.61 - 1.24 mg/dL   Calcium 9.3 8.9 - 10.3 mg/dL   Total Protein 7.3 6.5 - 8.1  g/dL   Albumin 4.0 3.5 - 5.0 g/dL   AST 24 15 - 41 U/L   ALT 23 0 - 44 U/L   Alkaline Phosphatase 69 38 - 126 U/L   Total Bilirubin 0.7 0.3 - 1.2 mg/dL   GFR calc non Af Amer >60 >60 mL/min   GFR calc Af Amer >60 >60 mL/min   Anion gap 14 5 - 15    Comment: Performed at [facility] [address], [place], [place] [address]  Ethanol     Status: None   Collection Time: 12/27/18  1:31 PM  Result Value Ref Range   Alcohol, Ethyl (B) <10 <10 mg/dL    Comment: (NOTE) Lowest detectable limit for serum alcohol is 10 mg/dL. For medical purposes only. Performed at [facility], [place] [address], [place], [place] [address]     Ct Head Wo Contrast  Result Date: 12/27/2018 CLINICAL DATA:  Headache, tremors. EXAM: CT HEAD WITHOUT CONTRAST TECHNIQUE: Contiguous axial images were obtained from the base of the skull through the vertex without intravenous contrast. COMPARISON:  CT  and MRI scan of September 21, 2012. FINDINGS: Brain: Ventricular size is within normal limits. There is no evidence of hemorrhage or acute infarction. Meningeal mass measuring 5.3 x 3.4 x 1.7 cm is seen predominantly in the right para falcine region superiorly which appears to be extending into or adjacent to superior sagittal sinus. This appears to be enlarged compared to prior exam. Vascular: No hyperdense vessel or unexpected calcification. Skull: Status post right superior parietal craniotomy. No acute fracture or osseous abnormality is noted. Sinuses/Orbits: No acute finding. Other: None. IMPRESSION: 5.3 x 3.4 x 1.7 cm dural based mass is noted predominantly in the right para falcine region superiorly which appears to be extending into or adjacent to the superior sagittal sinus. This appears to be enlarged compared to the prior exam. Since the patient appears to have undergone prior craniotomy in this region, this is concerning for recurrent or residual neoplasm or meningioma. Further evaluation with MRI with and without gadolinium is recommended. Electronically Signed   By: Marijo Conception M.D.   On: 12/27/2018 14:42    Review of Systems - Negative except per HPI    Blood pressure 114/75, pulse (!) 119, temperature 99 F (37.2 C), temperature source Oral, resp. rate (!) 31, SpO2 94 %. Physical Exam  Constitutional: He is oriented to person, place, and time. He appears well-developed and well-nourished.  HENT:  Head: Normocephalic.  Eyes: Pupils are equal, round, and reactive to light. Conjunctivae and EOM are normal.  Neck: Normal range of motion. Neck supple.  Cardiovascular: Normal rate and regular rhythm.  Respiratory: Effort normal and breath sounds normal.  GI: Soft. Bowel sounds are normal.  Musculoskeletal: Normal range of motion.  Neurological: He is alert and oriented to person, place, and time. A sensory deficit is present. No cranial nerve deficit. He displays seizure activity. GCS eye  subscore is 4. GCS verbal subscore is 5. GCS motor subscore is 6.  Weak in left leg. Some weakness, but milder in left arm.   I witnessed seizure activity on the left (arm, abdomen and leg).  He was alert and responsive during seizures and was able to follow commands.    He has numbness in left leg.    Assessment/Plan: Patient with new onset focal seizures.  He had craniotomy and resection of right parasagittal meningioma in 10/2012 and was lost to follow up.  He  is being admitted for seizure control and brain MRI to assess recurrent tumor.  I consulted Dr. Leonel Ramsay for help managing his seizures.  Peggyann Shoals, MD 12/27/2018, 3:17 PM

## 2018-12-27 NOTE — Progress Notes (Signed)
Wife Caylor Tallarico [phone] would like a call with an update about MRI results and plan today please!  Loleta Dicker RN

## 2018-12-27 NOTE — ED Triage Notes (Signed)
Pt here from work when he started having tremors on then left side and headache , onset was sudden, pt has history of benign brain tumor

## 2018-12-27 NOTE — ED Provider Notes (Signed)
[facility] EMERGENCY DEPARTMENT Provider Note   CSN: [id number] Arrival date & time: 12/27/18  1317     History   Chief Complaint Chief Complaint  Patient presents with  . Tremors    HPI Joshua [name] is a 58 y.o. male.     58 year old male presents the acute onset of left-sided jerks and twitches.  Does have a history of a right-sided brain tumor that was removed several years ago.  States he had numbness to his left lower extremity was then began to jerk and twitch followed by mild headache as well as left upper extremity twitching.  Denies any emesis.  No history of trauma.  No prior history of same.  No treatment use prior to arrival.     Past Medical History:  Diagnosis Date  . Brain tumor ([place]) 02/20/2013   brain tumor removed in March 2014, Dr Donald Pore  . Dizziness   . Enlarged prostate   . Headache(784.0)    scattered  . Meningioma [facility])     Patient Active Problem List   Diagnosis Date Noted  . Diffuse idiopathic skeletal hyperostosis 06/05/2013    Past Surgical History:  Procedure Laterality Date  . CATARACT EXTRACTION Right   . CRANIOTOMY Right 11/06/2012   Procedure: CRANIOTOMY TUMOR EXCISION;  Surgeon: Erline Levine, MD;  Location: [place] [facility];  Service: Neurosurgery;  Laterality: Right;  Right Parasagittal craniotomy for meningioma with Stealth  . ESOPHAGOGASTRODUODENOSCOPY    . right knee arthroscopy          Home Medications    Prior to Admission medications   Medication Sig Start Date End Date Taking? Authorizing Provider  aspirin EC 81 MG tablet Take 81 mg by mouth daily.    [provider]  ibuprofen (ADVIL,MOTRIN) 400 MG tablet Take 400 mg by mouth every 6 (six) hours as needed for mild pain.    [provider]  mometasone (NASONEX) 50 MCG/ACT nasal spray Place 2 sprays into the nose daily. Patient not taking: Reported on 08/07/2014 12/19/12   Verl Dicker, PA-C    Family History History reviewed. No  pertinent family history.  Social History Social History   Tobacco Use  . Smoking status: Current Some Day Smoker    Types: Cigarettes  . Smokeless tobacco: Never Used  . Tobacco comment: hasn't smoked in a week  Substance Use Topics  . Alcohol use: Yes    Alcohol/week: 8.0 standard drinks    Types: 6 Cans of beer, 2 Shots of liquor per week    Comment: occasionally  . Drug use: No     Allergies   Patient has no known allergies.   Review of Systems Review of Systems  All other systems reviewed and are negative.    Physical Exam Updated Vital Signs BP (!) 162/113   Pulse (!) 126   Temp 99 F (37.2 C) (Oral)   Resp (!) 26   SpO2 97%   Physical Exam Vitals signs and nursing note reviewed.  Constitutional:      General: He is not in acute distress.    Appearance: Normal appearance. He is well-developed. He is not toxic-appearing.  HENT:     Head: Normocephalic and atraumatic.  Eyes:     General: Lids are normal.     Conjunctiva/sclera: Conjunctivae normal.     Pupils: Pupils are equal, round, and reactive to light.  Neck:     Musculoskeletal: Normal range of motion and neck supple.  Thyroid: No thyroid mass.     Trachea: No tracheal deviation.  Cardiovascular:     Rate and Rhythm: Regular rhythm. Tachycardia present.     Heart sounds: Normal heart sounds. No murmur. No gallop.   Pulmonary:     Effort: Pulmonary effort is normal. No respiratory distress.     Breath sounds: Normal breath sounds. No stridor. No decreased breath sounds, wheezing, rhonchi or rales.  Abdominal:     General: Bowel sounds are normal. There is no distension.     Palpations: Abdomen is soft.     Tenderness: There is no abdominal tenderness. There is no rebound.  Musculoskeletal: Normal range of motion.        General: No tenderness.  Skin:    General: Skin is warm and dry.     Findings: No abrasion or rash.  Neurological:     Mental Status: He is alert and oriented to person,  place, and time.     GCS: GCS eye subscore is 4. GCS verbal subscore is 5. GCS motor subscore is 6.     Cranial Nerves: No cranial nerve deficit or dysarthria.     Sensory: No sensory deficit.     Motor: Tremor present.     Comments: Myoclonic jerks noted left upper and left lower extremity Upper and right lower extremity strength normal.  Psychiatric:        Speech: Speech normal.        Behavior: Behavior normal.      ED Treatments / Results  Labs (all labs ordered are listed, but only abnormal results are displayed) Labs Reviewed  CBC  COMPREHENSIVE METABOLIC PANEL  RAPID URINE DRUG SCREEN, HOSP PERFORMED  ETHANOL  CBG MONITORING, ED    EKG    Radiology No results found.  Procedures Procedures (including critical care time)  Medications Ordered in ED Medications  LORazepam (ATIVAN) 2 MG/ML injection (has no administration in time range)  LORazepam (ATIVAN) 2 MG/ML injection (has no administration in time range)  LORazepam (ATIVAN) injection 2 mg (2 mg Intravenous Given 12/27/18 1326)  levETIRAcetam (KEPPRA) IVPB 1000 mg/100 mL premix (1,000 mg Intravenous New Bag/Given 12/27/18 1329)     Initial Impression / Assessment and Plan / ED Course  I have reviewed the triage vital signs and the nursing notes.  Pertinent labs & imaging results that were available during my care of the patient were reviewed by me and considered in my medical decision making (see chart for details).        Patient given Ativan x3 here.  Also started on Keppra.  His seizures have greatly improved.  CT scan results noted discussed with Dr. Vertell Limber from neurosurgery who will come and see the patient.  CRITICAL CARE Performed by: Leota Jacobsen Total critical care time: 60 minutes Critical care time was exclusive of separately billable procedures and treating other patients. Critical care was necessary to treat or prevent imminent or life-threatening deterioration. Critical care was time  spent personally by me on the following activities: development of treatment plan with patient and/or surrogate as well as nursing, discussions with consultants, evaluation of patient's response to treatment, examination of patient, obtaining history from patient or surrogate, ordering and performing treatments and interventions, ordering and review of laboratory studies, ordering and review of radiographic studies, pulse oximetry and re-evaluation of patient's condition.   Final Clinical Impressions(s) / ED Diagnoses   Final diagnoses:  None    ED Discharge Orders    None  Lacretia Leigh, MD 12/27/18 [phone]

## 2018-12-27 NOTE — ED Notes (Signed)
Patient transported to CT 

## 2018-12-28 ENCOUNTER — Inpatient Hospital Stay (HOSPITAL_COMMUNITY): Payer: Medicaid Other

## 2018-12-28 LAB — HIV ANTIBODY (ROUTINE TESTING W REFLEX): HIV Screen 4th Generation wRfx: NONREACTIVE

## 2018-12-28 MED ORDER — SODIUM CHLORIDE 0.9 % IV SOLN
2000.0000 mg | Freq: Once | INTRAVENOUS | Status: AC
  Administered 2018-12-28: 2000 mg via INTRAVENOUS
  Filled 2018-12-28: qty 20

## 2018-12-28 MED ORDER — LEVETIRACETAM IN NACL 1500 MG/100ML IV SOLN
1500.0000 mg | Freq: Two times a day (BID) | INTRAVENOUS | Status: DC
  Administered 2018-12-28 – 2018-12-29 (×4): 1500 mg via INTRAVENOUS
  Filled 2018-12-28 (×5): qty 100

## 2018-12-28 MED ORDER — SODIUM CHLORIDE 0.9 % IV SOLN
1500.0000 mg | Freq: Once | INTRAVENOUS | Status: AC
  Administered 2018-12-28: 1500 mg via INTRAVENOUS
  Filled 2018-12-28: qty 30

## 2018-12-28 MED ORDER — DEXAMETHASONE SODIUM PHOSPHATE 10 MG/ML IJ SOLN
10.0000 mg | Freq: Once | INTRAMUSCULAR | Status: AC
  Administered 2018-12-28: 10 mg via INTRAVENOUS
  Filled 2018-12-28: qty 1

## 2018-12-28 MED ORDER — LORAZEPAM 2 MG/ML IJ SOLN
1.0000 mg | Freq: Once | INTRAMUSCULAR | Status: AC
  Administered 2018-12-28: 1 mg via INTRAVENOUS
  Filled 2018-12-28: qty 1

## 2018-12-28 NOTE — H&P (Signed)
Reason for Consult:seizures Referring Physician: [name] is an 58 y.o. male.  HPI: 57 year old male presents the acute onset of left-sided jerks and twitches. 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 States he had numbness to his left lower extremity was then began to jerk and twitch followed by mild headache as well as left upper extremity twitching. Denies any emesis. No history of trauma. No prior history of same. No treatment use prior to arrival.  The patient was lost to follow up after his surgery and did not return for follow up imaging. He had some left leg weakness postoperatively, but this improved and he returned to work.  He works Medical illustrator.  He had focal seizures on the left side with what appeared to be a Jacksonian March up his left leg.  These have continued throughout the day, despite Keppra and Ativan.         Past Medical History:  Diagnosis Date  . Brain tumor ([place]) 02/20/2013   brain tumor removed in March 2014, Dr Donald Pore  . Dizziness   . Enlarged prostate   . Headache(784.0)    scattered  . Meningioma [facility])          Past Surgical History:  Procedure Laterality Date  . CATARACT EXTRACTION Right   . CRANIOTOMY Right 11/06/2012   Procedure: CRANIOTOMY TUMOR EXCISION;  Surgeon: Erline Levine, MD;  Location: [place] [facility]URO ORS;  Service: Neurosurgery;  Laterality: Right;  Right Parasagittal craniotomy for meningioma with [facility]  . ESOPHAGOGASTRODUODENOSCOPY    . right knee arthroscopy      History reviewed. No pertinent family history.  Social History:  reports that he has been smoking cigarettes. He has never used smokeless tobacco. He reports current alcohol use of about 8.0 standard drinks of alcohol per week. He reports that he does not use drugs.  Allergies: No Known Allergies  Medications: I have reviewed the patient's current medications.  Lab Results Last 48 Hours         Results for orders placed or performed during the hospital encounter of 12/27/18 (from the past 48 hour(s))  POC CBG, ED     Status: None   Collection Time: 12/27/18  1:14 PM  Result Value Ref Range   Glucose-Capillary 91 70 - 99 mg/dL  CBC     Status: None   Collection Time: 12/27/18  1:25 PM  Result Value Ref Range   WBC 9.1 4.0 - 10.5 K/uL   RBC 5.30 4.22 - 5.81 MIL/uL   Hemoglobin 17.0 13.0 - 17.0 g/dL   HCT 51.1 39.0 - 52.0 %   MCV 96.4 80.0 - 100.0 fL   MCH 32.1 26.0 - 34.0 pg   MCHC 33.3 30.0 - 36.0 g/dL   RDW 12.8 11.5 - 15.5 %   Platelets 302 150 - 400 K/uL   nRBC 0.0 0.0 - 0.2 %    Comment: Performed at [facility], [place] [address], [place], [place] [address]  Comprehensive metabolic panel     Status: Abnormal   Collection Time: 12/27/18  1:25 PM  Result Value Ref Range   Sodium 144 135 - 145 mmol/L   Potassium 4.8 3.5 - 5.1 mmol/L   Chloride 110 98 - 111 mmol/L   CO2 20 (L) 22 - 32 mmol/L   Glucose, Bld 96 70 - 99 mg/dL   BUN 11 6 - 20 mg/dL   Creatinine, Ser 1.10 0.61 - 1.24 mg/dL  Calcium 9.3 8.9 - 10.3 mg/dL   Total Protein 7.3 6.5 - 8.1 g/dL   Albumin 4.0 3.5 - 5.0 g/dL   AST 24 15 - 41 U/L   ALT 23 0 - 44 U/L   Alkaline Phosphatase 69 38 - 126 U/L   Total Bilirubin 0.7 0.3 - 1.2 mg/dL   GFR calc non Af Amer >60 >60 mL/min   GFR calc Af Amer >60 >60 mL/min   Anion gap 14 5 - 15    Comment: Performed at [facility] [facility] [address], [place], [place] [address]  Ethanol     Status: None   Collection Time: 12/27/18  1:31 PM  Result Value Ref Range   Alcohol, Ethyl (B) <10 <10 mg/dL    Comment: (NOTE) Lowest detectable limit for serum alcohol is 10 mg/dL. For medical purposes only. Performed at [facility], [place] [address], [place], [place] [address]        Imaging Results (Last 48 hours)  Ct Head Wo Contrast  Result Date: 12/27/2018 CLINICAL DATA:  Headache, tremors. EXAM: CT HEAD  WITHOUT CONTRAST TECHNIQUE: Contiguous axial images were obtained from the base of the skull through the vertex without intravenous contrast. COMPARISON:  CT and MRI scan of September 21, 2012. FINDINGS: Brain: Ventricular size is within normal limits. There is no evidence of hemorrhage or acute infarction. Meningeal mass measuring 5.3 x 3.4 x 1.7 cm is seen predominantly in the right para falcine region superiorly which appears to be extending into or adjacent to superior sagittal sinus. This appears to be enlarged compared to prior exam. Vascular: No hyperdense vessel or unexpected calcification. Skull: Status post right superior parietal craniotomy. No acute fracture or osseous abnormality is noted. Sinuses/Orbits: No acute finding. Other: None. IMPRESSION: 5.3 x 3.4 x 1.7 cm dural based mass is noted predominantly in the right para falcine region superiorly which appears to be extending into or adjacent to the superior sagittal sinus. This appears to be enlarged compared to the prior exam. Since the patient appears to have undergone prior craniotomy in this region, this is concerning for recurrent or residual neoplasm or meningioma. Further evaluation with MRI with and without gadolinium is recommended. Electronically Signed   By: Marijo Conception M.D.   On: 12/27/2018 14:42     Review of Systems - Negative except per HPI    Blood pressure 114/75, pulse (!) 119, temperature 99 F (37.2 C), temperature source Oral, resp. rate (!) 31, SpO2 94 %. Physical Exam  Constitutional: He is oriented to person, place, and time. He appears well-developed and well-nourished.  HENT:  Head: Normocephalic.  Eyes: Pupils are equal, round, and reactive to light. Conjunctivae and EOM are normal.  Neck: Normal range of motion. Neck supple.  Cardiovascular: Normal rate and regular rhythm.  Respiratory: Effort normal and breath sounds normal.  GI: Soft. Bowel sounds are normal.  Musculoskeletal: Normal range of motion.   Neurological: He is alert and oriented to person, place, and time. A sensory deficit is present. No cranial nerve deficit. He displays seizure activity. GCS eye subscore is 4. GCS verbal subscore is 5. GCS motor subscore is 6.  Weak in left leg. Some weakness, but milder in left arm.   I witnessed seizure activity on the left (arm, abdomen and leg).  He was alert and responsive during seizures and was able to follow commands.    He has numbness in left leg.    Assessment/Plan: Patient with  new onset focal seizures.  He had craniotomy and resection of right parasagittal meningioma in 10/2012 and was lost to follow up.  He is being admitted for seizure control and brain MRI to assess recurrent tumor.  I consulted Dr. Leonel Ramsay for help managing his seizures.  Peggyann Shoals, MD 12/27/2018, 3:17 PM

## 2018-12-28 NOTE — Progress Notes (Signed)
Patient ID: Joshua [name], male   DOB: 30-Dec-1960, 58 y.o.   MRN: [id number] Awake, alert. Conversant. Denies pain. Reports decreased left arm/shoulder numbness. Twitching persists (decreased per pt) left shoulder. No longer twitching left leg.   [Family has apparently requested call from MD, concerned that pt is confused. Pt assures me he has spoken with family today and there is no need to call. (He reportedly did not recognize his son's voice yesterday)]

## 2018-12-28 NOTE — Plan of Care (Signed)

## 2018-12-28 NOTE — Progress Notes (Signed)
Follow up evaluation:  S: Has had intermittent subtle twitching of LUE and LLE per RN  O: BP 114/78   Pulse (!) 55   Temp 98 F (36.7 C) (Oral)   Resp 15   SpO2 100%   Exam reveals subtle twitching of LUE for about 3 seconds x 1. Left side slightly weaker than right. Able to follow commands and answer questions. Mildly sedated due to Ativan.   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 1. Additional 2000 mg Keppra is being loaded.  2. Increasing scheduled Keppra to 1500 mg BID.  3. EEG in the AM.  4. Frequent neuro checks  Electronically signed: Dr. Kerney Elbe

## 2018-12-28 NOTE — Progress Notes (Signed)
Subjective: Continued to have seizures overnight.   Exam: Vitals:   12/28/18 0000 12/28/18 0400  BP: 114/78   Pulse: (!) 55   Resp: 15   Temp: 98 F (36.7 C) 98.1 F (36.7 C)  SpO2: 100%    Gen: In bed, NAD Resp: non-labored breathing, no acute distress Abd: soft, nt  Neuro: MS: awake, alert, interactive and apporpriate NO:TRRN, VFF Motor: He has left arm and leg weakness(4/5), full strength on th eright.  Sensory:decreased on the left.   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 Also, edema may be contributing to irritability, will give dose of decadron as well   Recommendations: 1) Load with fosphenytoin, 20/kg 2) continue keppra 1500mg  BID 3) decadron 10mg  x1  Roland Rack, MD [facility] [phone]  If 7pm- 7am, please page neurology on call as listed in [place].

## 2018-12-28 NOTE — Procedures (Signed)
History: 58 year old being evaluated for recurrent seizures  Sedation: None  Technique: This is a 21 channel routine scalp EEG performed at the bedside with bipolar and monopolar montages arranged in accordance to the international 10/20 system of electrode placement. One channel was dedicated to EKG recording.    Background: The background consists of intermixed alpha and beta activities. There is a well defined posterior dominant rhythm of 9 hz that attenuates with eye opening. Sleep is recorded with normal appearing structures.  There is focal irregular 3 to 4 Hz activity maximal at C4, also seen prominently at [facility].  Photic stimulation: Physiologic driving is not performed  EEG Abnormalities: 1) right central parietal slow activity  Clinical Interpretation: This EEG is consistent with a focal area of cerebral dysfunction in the right central parietal area.   There was no seizure or definite evidence of seizure predisposition recorded on this study. Please note that lack of epileptiform activity on EEG does not preclude the possibility of epilepsy.   Roland Rack, MD [facility] [phone]  If 7pm- 7am, please page neurology on call as listed in [place].

## 2018-12-28 NOTE — Progress Notes (Signed)
Subjective: Patient reports awake, alert, has been seizing during night.  Objective: Vital signs in last 24 hours: Temp:  [98 F (36.7 C)-99 F (37.2 C)] 98.1 F (36.7 C) (06/12 0400) Pulse Rate:  [55-126] 55 (06/12 0000) Resp:  [15-38] 15 (06/12 0000) BP: (108-162)/(68-113) 114/78 (06/12 0000) SpO2:  [94 %-100 %] 100 % (06/12 0000)  Intake/Output from previous day: No intake/output data recorded. Intake/Output this shift: No intake/output data recorded.  Physical Exam: Hemiparesis is improved.  Patient notes numbness in left side, but says this is better.  His left arm and leg weakness are both improved.  Greater than antigravity strength in both arm and leg with strong grip.  Some seizure activity noted on left side of abdomen, currently none in arm or leg, although this has been noted throughout the night.    Lab Results: Recent Labs    12/27/18 1325  WBC 9.1  HGB 17.0  HCT 51.1  PLT 302   BMET Recent Labs    12/27/18 1325  NA 144  K 4.8  CL 110  CO2 20*  GLUCOSE 96  BUN 11  CREATININE 1.10  CALCIUM 9.3    Studies/Results: Ct Head Wo Contrast  Result Date: 12/27/2018 CLINICAL DATA:  Headache, tremors. EXAM: CT HEAD WITHOUT CONTRAST TECHNIQUE: Contiguous axial images were obtained from the base of the skull through the vertex without intravenous contrast. COMPARISON:  CT and MRI scan of September 21, 2012. FINDINGS: Brain: Ventricular size is within normal limits. There is no evidence of hemorrhage or acute infarction. Meningeal mass measuring 5.3 x 3.4 x 1.7 cm is seen predominantly in the right para falcine region superiorly which appears to be extending into or adjacent to superior sagittal sinus. This appears to be enlarged compared to prior exam. Vascular: No hyperdense vessel or unexpected calcification. Skull: Status post right superior parietal craniotomy. No acute fracture or osseous abnormality is noted. Sinuses/Orbits: No acute finding. Other: None. IMPRESSION:  5.3 x 3.4 x 1.7 cm dural based mass is noted predominantly in the right para falcine region superiorly which appears to be extending into or adjacent to the superior sagittal sinus. This appears to be enlarged compared to the prior exam. Since the patient appears to have undergone prior craniotomy in this region, this is concerning for recurrent or residual neoplasm or meningioma. Further evaluation with MRI with and without gadolinium is recommended. Electronically Signed   By: Marijo Conception M.D.   On: 12/27/2018 14:42   Mr Jeri Cos And Wo Contrast  Result Date: 12/27/2018 CLINICAL DATA:  Initial evaluation for acute seizure, history of prior right craniotomy for meningioma resection. EXAM: MRI HEAD WITHOUT AND WITH CONTRAST TECHNIQUE: Multiplanar, multiecho pulse sequences of the brain and surrounding structures were obtained without and with intravenous contrast. CONTRAST:  80 cc of Gadavist. COMPARISON:  Prior CT from earlier the same day as well as previous brain MRI from 09/21/2012. FINDINGS: Brain: Examination mildly degraded by motion artifact. Postoperative changes from previous right parasagittal craniotomy for meningioma resection seen. Previously identified right parafalcine meningioma again seen, increased in size from previous now measuring 7.6 x 3.0 x 4.3 cm (AP by craniocaudad by transverse). Prior resection cavity present within the central aspect of this lesion subjacent to the craniotomy bone flap. Small amount of associated vasogenic edema seen within the adjacent right parietal lobe, new from previous (series 4, image 21). Additionally, evidence for invasion of the adjacent superior sagittal sinus with secondary occlusion and obliteration at the level  of the tumor (series 13, image 12). Irregular flow void with associated T1 hyperintensity within the superior sagittal sinus posteriorly coursing towards the torcula likely reflects a degree of dural sinus thrombosis (series 10, image 53). No  evidence for associated venous infarct. Torcula, transverse, and sigmoid sinuses appear patent distally. Additionally, small left parafalcine component now seen at the contralateral left parietal convexity without associated edema within the adjacent left parietal lobe (series 13, image 11). Associated trace right-to-left midline shift. Remainder the brain is relatively normal in appearance with normal cerebral volume. No evidence for acute or subacute infarct. No areas of chronic cortical infarction. No acute intracranial hemorrhage. No other mass lesion or mass effect. No hydrocephalus or extra-axial fluid collection. Smooth dural enhancement overlying the cerebral convexities most consistent with postoperative change. No other abnormal enhancement. Pituitary gland suprasellar region normal. Midline structures intact. Vascular: Major arterial intracranial vascular flow voids maintained. Skull and upper cervical spine: Craniocervical junction normal. Bone marrow signal intensity within normal limits. Post craniotomy changes noted at the right posterior vertex. No acute scalp soft tissue abnormality. Sinuses/Orbits: Patient status post bilateral ocular lens replacement. Paranasal sinuses are largely clear. Small left mastoid effusion, of doubtful significance. Inner ear structures grossly normal. Other: None. IMPRESSION: 1. Residual and/or recurrent right posterior parafalcine meningioma now measuring 7.6 x 3.0 x 4.3 cm. Associated mild vasogenic edema within the adjacent right parietal lobe, new relative to 2014. 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 No associated venous infarct or other complication. 2. Otherwise stable and normal MRI of the brain. Electronically Signed   By: Jeannine Boga M.D.   On: 12/27/2018 20:25    Assessment/Plan: Patient is in focal motor status.  He is being loaded with  dilantin.  His keppra dose has been pushed up considerably.  His motor exam is improved and he is less sleepy today, despite additional doses of ativan.  We will start steroids today to help decrease vasogenic brain edema, as this may also improve his seizure frequency.  Patient's MRI shows regrowth of tumor with obliteration of Superior Sagittal Sinus (it was previously patent).  He will need surgery for meningioma resection.  This may need to be followed with radiation.  I appreciate Dr. Cecil Cobbs help.    LOS: 1 day    Peggyann Shoals, MD 12/28/2018, 7:42 AM

## 2018-12-28 NOTE — Progress Notes (Signed)
EEG Complete  Results Pending 

## 2018-12-29 ENCOUNTER — Inpatient Hospital Stay (HOSPITAL_COMMUNITY): Payer: Medicaid Other

## 2018-12-29 DIAGNOSIS — K0889 Other specified disorders of teeth and supporting structures: Secondary | ICD-10-CM

## 2018-12-29 MED ORDER — DIPHENHYDRAMINE HCL 25 MG PO CAPS
25.0000 mg | ORAL_CAPSULE | Freq: Once | ORAL | Status: AC
  Administered 2018-12-29: 25 mg via ORAL
  Filled 2018-12-29: qty 1

## 2018-12-29 MED ORDER — DEXAMETHASONE SODIUM PHOSPHATE 4 MG/ML IJ SOLN
4.0000 mg | Freq: Three times a day (TID) | INTRAMUSCULAR | Status: AC
  Administered 2018-12-29 (×3): 4 mg via INTRAVENOUS
  Filled 2018-12-29 (×3): qty 1

## 2018-12-29 MED ORDER — DEXAMETHASONE 2 MG PO TABS: 2.0000 mg | ORAL_TABLET | Freq: Two times a day (BID) | ORAL | Status: DC

## 2018-12-29 MED ORDER — PHENYTOIN SODIUM 50 MG/ML IJ SOLN
100.0000 mg | Freq: Three times a day (TID) | INTRAMUSCULAR | Status: DC
  Administered 2018-12-29 – 2018-12-30 (×4): 100 mg via INTRAVENOUS
  Filled 2018-12-29 (×5): qty 2

## 2018-12-29 NOTE — Procedures (Signed)
History: 59 year old male being evaluated for seizures  Sedation: None  Technique: This is a 21 channel routine scalp EEG performed at the bedside with bipolar and monopolar montages arranged in accordance to the international 10/20 system of electrode placement. One channel was dedicated to EKG recording.    Background: The background consists of intermixed alpha and beta activities. There is a well defined posterior dominant rhythm of 9 Hz that attenuates with eye opening. Sleep is recorded with normal appearing structures.  In addition there is mild focal slowing most prominent at C4.  Photic stimulation: Physiologic driving is not performed  EEG Abnormalities: 1) right central parietal slow activity  Clinical Interpretation: This EEG is consistent with a focal area of cerebral dysfunction seen in the right central parietal region.  Compared to the previous study, there has not been any significant change.   There was no seizure or seizure predisposition recorded on this study. Please note that lack of epileptiform activity on EEG does not preclude the possibility of epilepsy.   Roland Rack, MD [facility] [phone]  If 7pm- 7am, please page neurology on call as listed in [place].

## 2018-12-29 NOTE — Plan of Care (Signed)
  Problem: Education: Goal: Knowledge of General Education information will improve Description: Including pain rating scale, medication(s)/side effects and non-pharmacologic comfort measures 12/29/2018 1647 by Lawanda Cousins, RN Outcome: Progressing 12/29/2018 1647 by Lawanda Cousins, RN Outcome: Progressing   Problem: Health Behavior/Discharge Planning: Goal: Ability to manage health-related needs will improve 12/29/2018 1647 by Lawanda Cousins, RN Outcome: Progressing 12/29/2018 1647 by Lawanda Cousins, RN Outcome: Progressing   Problem: Clinical Measurements: Goal: Ability to maintain clinical measurements within normal limits will improve 12/29/2018 1647 by Lawanda Cousins, RN Outcome: Progressing 12/29/2018 1647 by Lawanda Cousins, RN Outcome: Progressing Goal: Will remain free from infection 12/29/2018 1647 by Lawanda Cousins, RN Outcome: Progressing 12/29/2018 1647 by Lawanda Cousins, RN Outcome: Progressing Goal: Diagnostic test results will improve 12/29/2018 1647 by Lawanda Cousins, RN Outcome: Progressing 12/29/2018 1647 by Lawanda Cousins, RN Outcome: Progressing Goal: Respiratory complications will improve 12/29/2018 1647 by Lawanda Cousins, RN Outcome: Progressing 12/29/2018 1647 by Lawanda Cousins, RN Outcome: Progressing Goal: Cardiovascular complication will be avoided 12/29/2018 1647 by Lawanda Cousins, RN Outcome: Progressing 12/29/2018 1647 by Lawanda Cousins, RN Outcome: Progressing   Problem: Activity: Goal: Risk for activity intolerance will decrease 12/29/2018 1647 by Lawanda Cousins, RN Outcome: Progressing 12/29/2018 1647 by Lawanda Cousins, RN Outcome: Progressing   Problem: Nutrition: Goal: Adequate nutrition will be maintained 12/29/2018 1647 by Lawanda Cousins, RN Outcome: Progressing 12/29/2018 1647 by Lawanda Cousins, RN Outcome: Progressing   Problem: Coping: Goal: Level of anxiety will decrease 12/29/2018 1647 by Lawanda Cousins, RN Outcome:  Progressing 12/29/2018 1647 by Lawanda Cousins, RN Outcome: Progressing   Problem: Elimination: Goal: Will not experience complications related to bowel motility Outcome: Progressing Goal: Will not experience complications related to urinary retention Outcome: Progressing   Problem: Pain Managment: Goal: General experience of comfort will improve Outcome: Progressing   Problem: Safety: Goal: Ability to remain free from injury will improve Outcome: Progressing   Problem: Skin Integrity: Goal: Risk for impaired skin integrity will decrease Outcome: Progressing

## 2018-12-29 NOTE — Progress Notes (Signed)
Called to evaluate the patient due to recurrence of LUE twitching.   On arrival to the room, the patient is lying on his right side with LUE positioned along his left trunk, with twitching at slightly irregular intervals averaging about 2 per second. The patient is fully awake and alert and able to grip and extend/flex at his elbow on the left with full strength - of note, the twitching stops when he follows the LUE motor commands. When finished with motor testing, the twitching does not recur.   Following the above exam, I asked him if anything was bothering him and he stated that he has had a toothache in his right maxillary region for several days. He states that he wants to see a dentist for this and also wants something to help him sleep.   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 Continue current anticonvulsant regimen. May need LTM EEG to capture a spell.  2. Outpatient dentist appointment should be set up prior to discharge.  3. Benadryl 25 mg po x 1.   Electronically signed: Dr. Kerney Elbe

## 2018-12-29 NOTE — Progress Notes (Signed)
Subjective: Had some shaking overnight, tremor vs sz  Exam: Vitals:   12/29/18 0411 12/29/18 0811  BP: (!) 143/89 130/82  Pulse: 76 74  Resp: 18 18  Temp: 98.1 F (36.7 C) 98.4 F (36.9 C)  SpO2: 98% 98%   Gen: In bed, NAD Resp: non-labored breathing, no acute distress Abd: soft, nt  Neuro: MS: awake, alert, interactive and apporpriate WG:[id number], VFF Motor: He has left arm and leg weakness(4/5), full strength on th eright.  Sensory:decreased on the left.   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 Edema may be contributing to his seizures some. I would like to quantify his seizures, and there has bene some concern that some of his shaking may be seizures, which is certainly possible in addition to the clear seizures witnessed previously. I would favor an overnight EEG to quantify seizures and characterize shaking.   Also, a dental abcess could lower sz threshold, and especially with starting steroids, will get panorex to rule out abcess.   Recommendations: 1) phenytoin 100mg  TID 2) continue keppra 1500mg  BID 3) decadron 4) panorex   Roland Rack, MD [facility] [phone]  If 7pm- 7am, please page neurology on call as listed in [place].

## 2018-12-29 NOTE — Progress Notes (Signed)
Subjective: Patient reports feeling better this morning  Objective: Vital signs in last 24 hours: Temp:  [97.8 F (36.6 C)-98.5 F (36.9 C)] 98.4 F (36.9 C) (06/13 0811) Pulse Rate:  [64-97] 74 (06/13 0811) Resp:  [16-22] 18 (06/13 0811) BP: (125-143)/(75-89) 130/82 (06/13 0811) SpO2:  [97 %-100 %] 98 % (06/13 0811)  Intake/Output from previous day: 06/12 0701 - 06/13 0700 In: 580 [P.O.:480; IV Piggyback:100] Out: 1400 [Urine:1400] Intake/Output this shift: No intake/output data recorded.  Physical Exam: Occasional twitching observed, but no status.  Motor exam improved.  Minimal weakness at this point.  Lab Results: Recent Labs    12/27/18 1325  WBC 9.1  HGB 17.0  HCT 51.1  PLT 302   BMET Recent Labs    12/27/18 1325  NA 144  K 4.8  CL 110  CO2 20*  GLUCOSE 96  BUN 11  CREATININE 1.10  CALCIUM 9.3    Studies/Results: Ct Head Wo Contrast  Result Date: 12/27/2018 CLINICAL DATA:  Headache, tremors. EXAM: CT HEAD WITHOUT CONTRAST TECHNIQUE: Contiguous axial images were obtained from the base of the skull through the vertex without intravenous contrast. COMPARISON:  CT and MRI scan of September 21, 2012. FINDINGS: Brain: Ventricular size is within normal limits. There is no evidence of hemorrhage or acute infarction. Meningeal mass measuring 5.3 x 3.4 x 1.7 cm is seen predominantly in the right para falcine region superiorly which appears to be extending into or adjacent to superior sagittal sinus. This appears to be enlarged compared to prior exam. Vascular: No hyperdense vessel or unexpected calcification. Skull: Status post right superior parietal craniotomy. No acute fracture or osseous abnormality is noted. Sinuses/Orbits: No acute finding. Other: None. IMPRESSION: 5.3 x 3.4 x 1.7 cm dural based mass is noted predominantly in the right para falcine region superiorly which appears to be extending into or adjacent to the superior sagittal sinus. This appears to be  enlarged compared to the prior exam. Since the patient appears to have undergone prior craniotomy in this region, this is concerning for recurrent or residual neoplasm or meningioma. Further evaluation with MRI with and without gadolinium is recommended. Electronically Signed   By: Marijo Conception M.D.   On: 12/27/2018 14:42   Mr Jeri Cos And Wo Contrast  Result Date: 12/27/2018 CLINICAL DATA:  Initial evaluation for acute seizure, history of prior right craniotomy for meningioma resection. EXAM: MRI HEAD WITHOUT AND WITH CONTRAST TECHNIQUE: Multiplanar, multiecho pulse sequences of the brain and surrounding structures were obtained without and with intravenous contrast. CONTRAST:  80 cc of Gadavist. COMPARISON:  Prior CT from earlier the same day as well as previous brain MRI from 09/21/2012. FINDINGS: Brain: Examination mildly degraded by motion artifact. Postoperative changes from previous right parasagittal craniotomy for meningioma resection seen. Previously identified right parafalcine meningioma again seen, increased in size from previous now measuring 7.6 x 3.0 x 4.3 cm (AP by craniocaudad by transverse). Prior resection cavity present within the central aspect of this lesion subjacent to the craniotomy bone flap. Small amount of associated vasogenic edema seen within the adjacent right parietal lobe, new from previous (series 4, image 21). Additionally, evidence for invasion of the adjacent superior sagittal sinus with secondary occlusion and obliteration at the level of the tumor (series 13, image 12). Irregular flow void with associated T1 hyperintensity within the superior sagittal sinus posteriorly coursing towards the torcula likely reflects a degree of dural sinus thrombosis (series 10, image 53). No evidence for associated venous infarct.  Torcula, transverse, and sigmoid sinuses appear patent distally. Additionally, small left parafalcine component now seen at the contralateral left parietal  convexity without associated edema within the adjacent left parietal lobe (series 13, image 11). Associated trace right-to-left midline shift. Remainder the brain is relatively normal in appearance with normal cerebral volume. No evidence for acute or subacute infarct. No areas of chronic cortical infarction. No acute intracranial hemorrhage. No other mass lesion or mass effect. No hydrocephalus or extra-axial fluid collection. Smooth dural enhancement overlying the cerebral convexities most consistent with postoperative change. No other abnormal enhancement. Pituitary gland suprasellar region normal. Midline structures intact. Vascular: Major arterial intracranial vascular flow voids maintained. Skull and upper cervical spine: Craniocervical junction normal. Bone marrow signal intensity within normal limits. Post craniotomy changes noted at the right posterior vertex. No acute scalp soft tissue abnormality. Sinuses/Orbits: Patient status post bilateral ocular lens replacement. Paranasal sinuses are largely clear. Small left mastoid effusion, of doubtful significance. Inner ear structures grossly normal. Other: None. IMPRESSION: 1. Residual and/or recurrent right posterior parafalcine meningioma now measuring 7.6 x 3.0 x 4.3 cm. Associated mild vasogenic edema within the adjacent right parietal lobe, new relative to 2014. 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 No associated venous infarct or other complication. 2. Otherwise stable and normal MRI of the brain. Electronically Signed   By: Jeannine Boga M.D.   On: 12/27/2018 20:25    Assessment/Plan: I appreciate Neurology input.  Will see if seizures resolved with EEG.  I had lengthy discussion with wife and with patient.  Plan will be if patient is seizure free, he can be discharged home and return for craniotomy for tumor in the near future.  If  seizures do not resolve, then we will proceed with surgery on more expedited basis.    LOS: 2 days    Peggyann Shoals, MD 12/29/2018, 8:27 AM

## 2018-12-29 NOTE — Progress Notes (Signed)
vEEG started notified [facility]

## 2018-12-30 DIAGNOSIS — G40019 Localization-related (focal) (partial) idiopathic epilepsy and epileptic syndromes with seizures of localized onset, intractable, without status epilepticus: Secondary | ICD-10-CM

## 2018-12-30 MED ORDER — PHENYTOIN SODIUM EXTENDED 100 MG PO CAPS
300.0000 mg | ORAL_CAPSULE | Freq: Every day | ORAL | Status: DC
  Administered 2018-12-30: 300 mg via ORAL
  Filled 2018-12-30: qty 3

## 2018-12-30 MED ORDER — PHENYTOIN SODIUM EXTENDED 100 MG PO CAPS
100.0000 mg | ORAL_CAPSULE | Freq: Once | ORAL | Status: AC
  Administered 2018-12-30: 100 mg via ORAL
  Filled 2018-12-30: qty 1

## 2018-12-30 MED ORDER — LEVETIRACETAM 750 MG PO TABS
1500.0000 mg | ORAL_TABLET | Freq: Two times a day (BID) | ORAL | Status: DC
  Administered 2018-12-30 – 2018-12-31 (×3): 1500 mg via ORAL
  Filled 2018-12-30 (×3): qty 2

## 2018-12-30 NOTE — Progress Notes (Signed)
LTM d/c per Dr. Leonel Ramsay,  No skin breakdown

## 2018-12-30 NOTE — Procedures (Signed)
LTM-EEG Report  HISTORY: Continuous video-EEG monitoring performed for 58 year old with brain tumor, seizures.  ACQUISITION: International 10-20 system for electrode placement; 18 channels with additional eyes linked to ipsilateral ears and EKG. Additional T1-T2 electrodes were used. Continuous video recording obtained.   EEG NUMBER:  MEDICATIONS:  Day 1:  See emr  DAY #1: from 1229 12/29/18 to 0730 12/30/18   BACKGROUND: An overall medium voltage continuous recording with good spontaneous variability and reactivity. Waking background consisted of a medium voltage 10 Hz posterior dominant rhythm bilaterally with low voltage beta activity in the bilateral frontocentral regions and some medium voltage theta activity diffusely. Superimposed focal, irregular delta activity was present in the right posterior region. Sleep was captured with normal stage II sleep architecture.  EPILEPTIFORM/PERIODIC ACTIVITY: none SEIZURES: none EVENTS: none  EKG: no significant arrhythmia  SUMMARY: This was a mildly abnormal continuous video EEG due to mild focal slowing in the right posterior region, consistent with a focal cerebral disturbance on that side. No seizures or epileptiform discharges were seen.

## 2018-12-30 NOTE — Progress Notes (Addendum)
Subjective: Had some shaking overnight, tremor vs sz  Exam: Vitals:   12/30/18 0620 12/30/18 0733  BP: 122/80 114/74  Pulse: (!) 59 61  Resp: 17 20  Temp: 97.9 F (36.6 C) 98.4 F (36.9 C)  SpO2: 98% 97%   Gen: In bed, NAD Resp: non-labored breathing, no acute distress Abd: soft, nt  Neuro: MS: awake, alert, interactive and apporpriate WS:FKCL, VFF Motor: He has left arm and leg weakness(4/5), full strength on th eright.  Sensory:decreased on the left.   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 Edema may be contributing to his seizures some. His seizures have resolved at this time, no evidence that tremor activity was ictal.   No evidence of abcess on panorex. I advised he follow up with a dentist.   Recommendations: 1) change dilantin to 300mg  QHS PO 2) keppra 1500mg  BID PO 3) decadron 4) can d/c LTM EEG    Roland Rack, MD [facility] [phone]  If 7pm- 7am, please page neurology on call as listed in [place].

## 2018-12-30 NOTE — Progress Notes (Signed)
  NEUROSURGERY PROGRESS NOTE   No issues overnight.  No further seizures Complains of intermittent left quad muscle pain but otherwise feels well.  EXAM:  BP 114/74 (BP Location: Left Arm)   Pulse 61   Temp 98.4 F (36.9 C) (Oral)   Resp 20   Wt 85.3 kg   SpO2 97%   BMI 27.77 kg/m   Awake, alert, oriented  Speech fluent, appropriate  CN grossly intact  Grossly normal strength with exception of minimal weakness LLE  PLAN Stable neurologically.  No further seizures. Will continue to monitor for 24 hours and potentially d/c tomorrow if no further seizures

## 2018-12-31 ENCOUNTER — Other Ambulatory Visit: Payer: Self-pay

## 2018-12-31 ENCOUNTER — Other Ambulatory Visit: Payer: Self-pay | Admitting: Neurosurgery

## 2018-12-31 MED ORDER — LEVETIRACETAM 500 MG PO TABS: 1500.0000 mg | ORAL_TABLET | Freq: Two times a day (BID) | ORAL | 0 refills | Status: DC

## 2018-12-31 MED ORDER — DEXAMETHASONE 4 MG PO TABS: 2.0000 mg | ORAL_TABLET | Freq: Two times a day (BID) | ORAL | 1 refills | Status: DC

## 2018-12-31 MED ORDER — DEXAMETHASONE 4 MG PO TABS: 4.0000 mg | ORAL_TABLET | Freq: Two times a day (BID) | ORAL | 0 refills | Status: DC

## 2018-12-31 MED ORDER — PHENYTOIN SODIUM EXTENDED 300 MG PO CAPS: 300.0000 mg | ORAL_CAPSULE | Freq: Every day | ORAL | 2 refills | Status: DC

## 2018-12-31 MED ORDER — LEVETIRACETAM 750 MG PO TABS: 1500.0000 mg | ORAL_TABLET | Freq: Two times a day (BID) | ORAL | 2 refills | Status: DC

## 2018-12-31 MED ORDER — PHENYTOIN SODIUM EXTENDED 300 MG PO CAPS: 300.0000 mg | ORAL_CAPSULE | Freq: Every day | ORAL | 0 refills | Status: DC

## 2018-12-31 MED FILL — levETIRAcetam 500 MG TABS: 500 | 30 days supply | Qty: 180 | Fill #0

## 2018-12-31 MED FILL — PHENYTOIN SOD EXT 300 MG CA: 300 | 30 days supply | Qty: 30 | Fill #0

## 2018-12-31 MED FILL — DEXAMETHASONE 4 MG TABLET: 4 | 30 days supply | Qty: 60 | Fill #0

## 2018-12-31 NOTE — Discharge Instructions (Signed)
Increase activity slowly °

## 2018-12-31 NOTE — Plan of Care (Signed)

## 2018-12-31 NOTE — TOC Transition Note (Addendum)
Transition of Care [facility]) - CM/SW Discharge Note   Patient Details  Name: Joshua [name] MRN: [id number] Date of Birth: Apr 23, 1961  [facility]) CM/SW Contact:  Maryclare Labrador, RN Phone Number: 12/31/2018, 9:49 AM   Clinical Narrative:   Pt to discharge home today - pt is independent from home with wife.  Pt in agreement for CM to set up appt with [facility] - appt documented on AVS.  Pt confirms that he does not have active insurance - pt will be MATCHED for dc meds - TOC will deliver meds bedside.  NO other CM needs determined at this time - CM signing off    Final next level of care: Home/Self Care Barriers to Discharge: Barriers Resolved   Patient Goals and CMS Choice        Discharge Placement                       Discharge Plan and Services                                     Social Determinants of Health (SDOH) Interventions     Readmission Risk Interventions No flowsheet data found.

## 2018-12-31 NOTE — Progress Notes (Addendum)
Subjective: Patient reports "I'm doing ok. I just shake some when I make a sudden movement"  Objective: Vital signs in last 24 hours: Temp:  [97.6 F (36.4 C)-98.9 F (37.2 C)] 97.6 F (36.4 C) (06/15 0810) Pulse Rate:  [57-78] 68 (06/15 0810) Resp:  [11-20] 20 (06/15 0810) BP: (105-131)/(70-84) 131/84 (06/15 0810) SpO2:  [85 %-100 %] 100 % (06/15 0810)  Intake/Output from previous day: 06/14 0701 - 06/15 0700 In: 480 [P.O.:480] Out: 200 [Urine:200] Intake/Output this shift: No intake/output data recorded.  Alert, conversant. Denies pain/discomfort. MAEW. No drift. PEARL. No tremor/twitch at rest. He notes left shoulder twitch after sudden movements/position changes, short duration. Aware of and agreeable with plan for d/c to home and plan for craniotomy in a couple weeks.   Lab Results: No results for input(s): WBC, HGB, HCT, PLT in the last 72 hours. BMET No results for input(s): NA, K, CL, CO2, GLUCOSE, BUN, CREATININE, CALCIUM in the last 72 hours.  Studies/Results: Dg Orthopantogram  Result Date: 12/29/2018 CLINICAL DATA:  58 year old male with a history toothache on the right. EXAM: ORTHOPANTOGRAM/PANORAMIC COMPARISON:  None. FINDINGS: No acute displaced fracture. There are absent mandibular and molar teeth bilaterally. Dental amalgam of right maxillary premolar. Lucency within the last right mandibular molar. IMPRESSION: Lucency within the last right mandibular molar, concerning for dental caries. Evidence of baseline endodontal disease with bilateral absent mandibular molar teeth and presence of dental amalgams. Electronically Signed   By: Corrie Mckusick D.O.   On: 12/29/2018 13:35    Assessment/Plan: Improving on steroids  LOS: 4 days  Per Dr. Vertell Limber, d/c to home with Decadron 4mg  BID po, Keppra 1500mg  BID , and Dilantin ER 300mg  qHS. PLan for craniotomy for tumor resection in 2 weeks.    Joshua [name] 12/31/2018, 8:23 AM   Patient's seizures now controlled.   Discharge home with craniotomy for tumor in approximately 2 weeks.

## 2018-12-31 NOTE — Discharge Summary (Signed)
Physician Discharge Summary  Patient ID: LAIN TETTERTON MRN: [id number] DOB/AGE: 12/26/1960 58 y.o.  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 Workup revealed recurrent meningioma previously resected. Pt was lost to follow up. Seizure/twitching activity has been controlled with medication. He is currently stable.   Consults: neurology  Significant Diagnostic Studies:   Treatments:   Discharge Exam: Blood pressure 131/84, pulse 68, temperature 97.6 F (36.4 C), temperature source Oral, resp. rate 20, weight 85.3 kg, SpO2 100 %. Alert, conversant. Denies pain/discomfort. MAEW. No drift. PEARL. No tremor/twitch at rest. He notes left shoulder twitch after sudden movements/position changes, short duration. Aware of and agreeable with plan for d/c to home and plan for craniotomy in a couple weeks.    Disposition:  Discharge to home with Decadron 4mg  BID po, Keppra1500mg  BID, and Dilantin ER 300mg  qHS. PLan for craniotomy for tumor resection in 2 weeks.     Allergies as of 12/31/2018   No Known Allergies   Discharge Instructions    Diet - low sodium heart healthy   Complete by: As directed    Increase activity slowly   Complete by: As directed      Allergies as of 12/31/2018   No Known Allergies     Medication List    STOP taking these medications   ibuprofen 200 MG tablet Commonly known as: ADVIL     TAKE these medications   dexamethasone 4 MG tablet Commonly known as: DECADRON Take 0.5 tablets (2 mg total) by mouth 2 (two) times daily with a meal.    levETIRAcetam 750 MG tablet Commonly known as: KEPPRA Take 2 tablets (1,500 mg total) by mouth 2 (two) times daily.   phenytoin 300 MG ER capsule Commonly known as: DILANTIN Take 1 capsule (300 mg total) by mouth at bedtime.        Signed: Peggyann Shoals, MD 12/31/2018, 8:46 AM

## 2019-01-01 ENCOUNTER — Other Ambulatory Visit: Payer: Self-pay | Admitting: Neurosurgery

## 2019-01-01 ENCOUNTER — Other Ambulatory Visit: Payer: Self-pay | Admitting: Radiation Therapy

## 2019-01-01 DIAGNOSIS — D320 Benign neoplasm of cerebral meninges: Secondary | ICD-10-CM

## 2019-01-03 ENCOUNTER — Encounter (HOSPITAL_COMMUNITY): Payer: Self-pay

## 2019-01-03 ENCOUNTER — Other Ambulatory Visit: Payer: Self-pay

## 2019-01-03 ENCOUNTER — Inpatient Hospital Stay (HOSPITAL_COMMUNITY)
Admission: EM | Admit: 2019-01-03 | Discharge: 2019-01-14 | DRG: 027 | Disposition: A | Discharge status: Home / Self Care | Payer: Medicaid Other | Attending: Neurosurgery | Admitting: Neurosurgery

## 2019-01-03 ENCOUNTER — Emergency Department (HOSPITAL_COMMUNITY): Payer: Medicaid Other

## 2019-01-03 DIAGNOSIS — Z79899 Other long term (current) drug therapy: Secondary | ICD-10-CM | POA: Diagnosis not present

## 2019-01-03 DIAGNOSIS — F17210 Nicotine dependence, cigarettes, uncomplicated: Secondary | ICD-10-CM | POA: Diagnosis present

## 2019-01-03 DIAGNOSIS — Z86011 Personal history of benign neoplasm of the brain: Secondary | ICD-10-CM

## 2019-01-03 DIAGNOSIS — N400 Enlarged prostate without lower urinary tract symptoms: Secondary | ICD-10-CM | POA: Diagnosis not present

## 2019-01-03 DIAGNOSIS — Z7952 Long term (current) use of systemic steroids: Secondary | ICD-10-CM | POA: Diagnosis not present

## 2019-01-03 DIAGNOSIS — Z1159 Encounter for screening for other viral diseases: Secondary | ICD-10-CM | POA: Diagnosis not present

## 2019-01-03 DIAGNOSIS — R251 Tremor, unspecified: Secondary | ICD-10-CM | POA: Diagnosis present

## 2019-01-03 DIAGNOSIS — D320 Benign neoplasm of cerebral meninges: Secondary | ICD-10-CM | POA: Diagnosis not present

## 2019-01-03 DIAGNOSIS — D329 Benign neoplasm of meninges, unspecified: Secondary | ICD-10-CM | POA: Diagnosis present

## 2019-01-03 DIAGNOSIS — F419 Anxiety disorder, unspecified: Secondary | ICD-10-CM | POA: Diagnosis not present

## 2019-01-03 LAB — BASIC METABOLIC PANEL
Anion gap: 9 (ref 5–15)
BUN: 15 mg/dL (ref 6–20)
CO2: 26 mmol/L (ref 22–32)
Calcium: 9.4 mg/dL (ref 8.9–10.3)
Chloride: 106 mmol/L (ref 98–111)
Creatinine, Ser: 0.82 mg/dL (ref 0.61–1.24)
GFR calc Af Amer: >60 mL/min (ref >60)
GFR calc non Af Amer: >60 mL/min (ref >60)
Glucose, Bld: 110 mg/dL — ABNORMAL HIGH (ref 70–99)
Potassium: 3.8 mmol/L (ref 3.5–5.1)
Sodium: 141 mmol/L (ref 135–145)

## 2019-01-03 LAB — CBC
HCT: 49.6 % (ref 39.0–52.0)
Hemoglobin: 16.6 g/dL (ref 13.0–17.0)
MCH: 32.6 pg (ref 26.0–34.0)
MCHC: 33.5 g/dL (ref 30.0–36.0)
MCV: 97.4 fL (ref 80.0–100.0)
Platelets: 324 10*3/uL (ref 150–400)
RBC: 5.09 MIL/uL (ref 4.22–5.81)
RDW: 13.1 % (ref 11.5–15.5)
WBC: 9.7 10*3/uL (ref 4.0–10.5)
nRBC: 0 % (ref 0.0–0.2)

## 2019-01-03 LAB — SARS CORONAVIRUS 2 BY RT PCR (HOSPITAL ORDER, PERFORMED IN ~~LOC~~ HOSPITAL LAB): SARS Coronavirus 2: NEGATIVE

## 2019-01-03 LAB — CBG MONITORING, ED: Glucose-Capillary: 99 mg/dL (ref 70–99)

## 2019-01-03 LAB — PHENYTOIN LEVEL, TOTAL: Phenytoin Lvl: 16.1 ug/mL (ref 10.0–20.0)

## 2019-01-03 MED ORDER — SODIUM CHLORIDE 0.9% FLUSH
3.0000 mL | Freq: Two times a day (BID) | INTRAVENOUS | Status: DC
  Administered 2019-01-04 – 2019-01-14 (×18): 3 mL via INTRAVENOUS

## 2019-01-03 MED ORDER — BISACODYL 5 MG PO TBEC
5.0000 mg | DELAYED_RELEASE_TABLET | Freq: Every day | ORAL | Status: DC | PRN
  Administered 2019-01-12 – 2019-01-13 (×2): 5 mg via ORAL
  Filled 2019-01-03 (×3): qty 1

## 2019-01-03 MED ORDER — SENNOSIDES-DOCUSATE SODIUM 8.6-50 MG PO TABS
1.0000 | ORAL_TABLET | Freq: Every evening | ORAL | Status: DC | PRN
  Administered 2019-01-13: 1 via ORAL

## 2019-01-03 MED ORDER — SODIUM CHLORIDE 0.9 % IV SOLN
250.0000 mL | INTRAVENOUS | Status: DC | PRN
  Administered 2019-01-10: 13:00:00 via INTRAVENOUS

## 2019-01-03 MED ORDER — DEXAMETHASONE 4 MG PO TABS
4.0000 mg | ORAL_TABLET | Freq: Two times a day (BID) | ORAL | Status: DC
  Administered 2019-01-04: 4 mg via ORAL
  Filled 2019-01-03: qty 1

## 2019-01-03 MED ORDER — SODIUM CHLORIDE 0.9% FLUSH
3.0000 mL | INTRAVENOUS | Status: DC | PRN
  Administered 2019-01-13: 3 mL via INTRAVENOUS
  Filled 2019-01-03: qty 3

## 2019-01-03 MED ORDER — ACETAMINOPHEN 650 MG RE SUPP: 650.0000 mg | Freq: Four times a day (QID) | RECTAL | Status: DC | PRN

## 2019-01-03 MED ORDER — LORAZEPAM 2 MG/ML IJ SOLN
1.0000 mg | Freq: Once | INTRAMUSCULAR | Status: AC
  Administered 2019-01-03: 1 mg via INTRAVENOUS
  Filled 2019-01-03: qty 1

## 2019-01-03 MED ORDER — ACETAMINOPHEN 325 MG PO TABS: 650.0000 mg | ORAL_TABLET | Freq: Four times a day (QID) | ORAL | Status: DC | PRN

## 2019-01-03 MED ORDER — FLEET ENEMA 7-19 GM/118ML RE ENEM: 1.0000 | ENEMA | Freq: Once | RECTAL | Status: DC | PRN

## 2019-01-03 MED ORDER — LORAZEPAM 2 MG/ML IJ SOLN
0.5000 mg | INTRAMUSCULAR | Status: DC | PRN
  Administered 2019-01-03: 1 mg via INTRAMUSCULAR
  Administered 2019-01-04 (×2): 2 mg via INTRAMUSCULAR
  Filled 2019-01-03 (×2): qty 1

## 2019-01-03 MED ORDER — LORAZEPAM 2 MG/ML IJ SOLN
INTRAMUSCULAR | Status: AC
  Administered 2019-01-04: 2 mg via INTRAMUSCULAR
  Filled 2019-01-03: qty 1

## 2019-01-03 MED ORDER — SODIUM CHLORIDE 0.9 % IV SOLN
INTRAVENOUS | Status: DC
  Administered 2019-01-10 (×2): via INTRAVENOUS

## 2019-01-03 MED ORDER — ONDANSETRON HCL 4 MG PO TABS: 4.0000 mg | ORAL_TABLET | Freq: Four times a day (QID) | ORAL | Status: DC | PRN

## 2019-01-03 MED ORDER — HYDROCODONE-ACETAMINOPHEN 5-325 MG PO TABS
1.0000 | ORAL_TABLET | ORAL | Status: DC | PRN
  Administered 2019-01-04 – 2019-01-05 (×7): 2 via ORAL
  Administered 2019-01-05: 1 via ORAL
  Administered 2019-01-05 – 2019-01-06 (×5): 2 via ORAL
  Administered 2019-01-06: 1 via ORAL
  Administered 2019-01-07 – 2019-01-10 (×8): 2 via ORAL
  Administered 2019-01-11: 1 via ORAL
  Administered 2019-01-11 – 2019-01-12 (×5): 2 via ORAL
  Administered 2019-01-12: 1 via ORAL
  Administered 2019-01-13: 2 via ORAL
  Administered 2019-01-13: 1 via ORAL
  Administered 2019-01-13 (×2): 2 via ORAL
  Administered 2019-01-14 (×2): 1 via ORAL
  Filled 2019-01-03 (×5): qty 2
  Filled 2019-01-03: qty 1
  Filled 2019-01-03 (×14): qty 2
  Filled 2019-01-03: qty 1
  Filled 2019-01-03 (×4): qty 2
  Filled 2019-01-03: qty 1
  Filled 2019-01-03 (×3): qty 2
  Filled 2019-01-03: qty 1
  Filled 2019-01-03 (×4): qty 2
  Filled 2019-01-03: qty 1
  Filled 2019-01-03: qty 2

## 2019-01-03 MED ORDER — LORAZEPAM 0.5 MG PO TABS
0.5000 mg | ORAL_TABLET | ORAL | Status: DC | PRN
  Administered 2019-01-05 – 2019-01-09 (×9): 1 mg via ORAL
  Filled 2019-01-03 (×9): qty 2

## 2019-01-03 MED ORDER — ZOLPIDEM TARTRATE 5 MG PO TABS
5.0000 mg | ORAL_TABLET | Freq: Every evening | ORAL | Status: DC | PRN
  Administered 2019-01-06 – 2019-01-10 (×2): 5 mg via ORAL
  Filled 2019-01-03 (×2): qty 1

## 2019-01-03 MED ORDER — ONDANSETRON HCL 4 MG/2ML IJ SOLN: 4.0000 mg | Freq: Four times a day (QID) | INTRAMUSCULAR | Status: DC | PRN

## 2019-01-03 MED ORDER — PHENYTOIN SODIUM EXTENDED 100 MG PO CAPS
300.0000 mg | ORAL_CAPSULE | Freq: Every day | ORAL | Status: DC
  Administered 2019-01-03 – 2019-01-13 (×11): 300 mg via ORAL
  Filled 2019-01-03 (×12): qty 3

## 2019-01-03 MED ORDER — LEVETIRACETAM 750 MG PO TABS
1500.0000 mg | ORAL_TABLET | Freq: Two times a day (BID) | ORAL | Status: DC
  Administered 2019-01-03 – 2019-01-14 (×22): 1500 mg via ORAL
  Filled 2019-01-03 (×22): qty 2

## 2019-01-03 MED ORDER — DOCUSATE SODIUM 100 MG PO CAPS
100.0000 mg | ORAL_CAPSULE | Freq: Two times a day (BID) | ORAL | Status: DC
  Administered 2019-01-03 – 2019-01-09 (×13): 100 mg via ORAL
  Filled 2019-01-03 (×13): qty 1

## 2019-01-03 NOTE — H&P (Signed)
Chief Complaint   Chief Complaint  Patient presents with  . Seizures    HPI   HPI: Joshua [name] is a 58 y.o. male s/p right-sided parasagittal meningioma resection 11/06/2012 by Dr Vertell Limber who was recently admitted due to acute left sided twitching/jerks on 12/28/2018 and found to have recurrent meningioma. Neurology was consulted for assistance with management of twitching/seizures. Patient was started on dilantin and Keppra in addition to decadron which resolved these episodes. Over the course of approx 48 hours, he had no evidence of seizure like activity and was discharged home on 12/31/2018 with outpt follow up. Unfortunately, since being home, he has had significant LLE twitching. He called the office and was prescribed Xanax without relief. Symptoms are random and last 10-15 minutes when they do occur. He has been unable to rest or perform ADLs due to difficulties with LLE twitching. He is scheduled for tumor resection on July 1st with Dr Vertell Limber. He continues to endorse mild LUE/LLE weakness. Denies worsening or new weakness. He did receive Ativan in ED and has not had any further episodes..  Patient Active Problem List   Diagnosis Date Noted  . Meningioma ([place]) 01/03/2019  . Brain tumor ([place]) 12/27/2018  . Diffuse idiopathic skeletal hyperostosis 06/05/2013    PMH: Past Medical History:  Diagnosis Date  . Brain tumor ([place]) 02/20/2013   brain tumor removed in March 2014, Dr Donald Pore  . Dizziness   . Enlarged prostate   . Headache(784.0)    scattered  . Meningioma ([facility])     PSH: Past Surgical History:  Procedure Laterality Date  . CATARACT EXTRACTION Right   . CRANIOTOMY Right 11/06/2012   Procedure: CRANIOTOMY TUMOR EXCISION;  Surgeon: Erline Levine, MD;  Location: [facility];  Service: Neurosurgery;  Laterality: Right;  Right Parasagittal craniotomy for meningioma with [facility]  . ESOPHAGOGASTRODUODENOSCOPY    . right knee arthroscopy      (Not in a hospital  admission)   SH: Social History   Tobacco Use  . Smoking status: Current Some Day Smoker    Types: Cigarettes  . Smokeless tobacco: Never Used  . Tobacco comment: hasn't smoked in a week  Substance Use Topics  . Alcohol use: Yes    Alcohol/week: 8.0 standard drinks    Types: 6 Cans of beer, 2 Shots of liquor per week    Comment: occasionally  . Drug use: No    MEDS: Prior to Admission medications   Medication Sig Start Date End Date Taking? Authorizing Provider  dexamethasone (DECADRON) 4 MG tablet Take 1 tablet (4 mg total) by mouth 2 (two) times daily with a meal. 12/31/18   Erline Levine, MD  levETIRAcetam (KEPPRA) 500 MG tablet Take 3 tablets (1,500 mg total) by mouth 2 (two) times daily. 12/31/18   Erline Levine, MD  phenytoin (DILANTIN) 300 MG ER capsule Take 1 capsule (300 mg total) by mouth at bedtime. 12/31/18   Erline Levine, MD    ALLERGY: No Known Allergies  Social History   Tobacco Use  . Smoking status: Current Some Day Smoker    Types: Cigarettes  . Smokeless tobacco: Never Used  . Tobacco comment: hasn't smoked in a week  Substance Use Topics  . Alcohol use: Yes    Alcohol/week: 8.0 standard drinks    Types: 6 Cans of beer, 2 Shots of liquor per week    Comment: occasionally     History reviewed. No pertinent family history.   ROS   Review of  Systems  Constitutional: Negative.   HENT: Negative.   Eyes: Negative.   Respiratory: Negative.   Cardiovascular: Negative.   Gastrointestinal: Negative.   Genitourinary: Negative.   Musculoskeletal: Negative.   Skin: Negative.   Neurological: Positive for tingling and tremors (LLE). Negative for dizziness, sensory change, speech change, focal weakness, seizures, loss of consciousness, weakness and headaches.    Exam   Vitals:   01/03/19 1500 01/03/19 1640  BP: 125/64 107/75  Pulse: 71 60  Resp: 13 17  Temp:    SpO2: 99% 99%   General appearance: WDWN, NAD Eyes: No scleral injection  Cardiovascular: Regular rate and rhythm without murmurs, rubs, gallops. No edema or variciosities. Distal pulses normal. Pulmonary: Effort normal, non-labored breathing Musculoskeletal:     Muscle tone upper extremities: Normal    Muscle tone lower extremities: Normal    Motor exam: 5/5 RUE/RLE, 4/4+/5 LUE/LLE Neurological Mental Status:    - Patient is awake, alert, oriented to person, place, month, year, and situation    - Patient is able to give a clear and coherent history.    - No signs of aphasia or neglect Cranial Nerves    - II: Visual Fields are full. PERRL    - III/IV/VI: EOMI without ptosis or diploplia.     - V: Facial sensation is grossly normal    - VII: Facial movement is symmetric.     - VIII: hearing is intact to voice    - X: Uvula elevates symmetrically    - XI: Shoulder shrug is symmetric.    - XII: tongue is midline without atrophy or fasciculations.  Sensory: Sensation grossly intact to LT on right. Slightly decreased on left. No tremor/twitching/jerk motion noted.  Results - Imaging/Labs   Results for orders placed or performed during the hospital encounter of 01/03/19 (from the past 48 hour(s))  Basic metabolic panel - if new onset seizures     Status: Abnormal   Collection Time: 01/03/19  2:27 PM  Result Value Ref Range   Sodium 141 135 - 145 mmol/L   Potassium 3.8 3.5 - 5.1 mmol/L   Chloride 106 98 - 111 mmol/L   CO2 26 22 - 32 mmol/L   Glucose, Bld 110 (H) 70 - 99 mg/dL   BUN 15 6 - 20 mg/dL   Creatinine, Ser 0.82 0.61 - 1.24 mg/dL   Calcium 9.4 8.9 - 10.3 mg/dL   GFR calc non Af Amer >60 >60 mL/min   GFR calc Af Amer >60 >60 mL/min   Anion gap 9 5 - 15    Comment: Performed at [facility], [place] [address], [place], [place] [address]  CBC - if new onset seizures     Status: None   Collection Time: 01/03/19  2:27 PM  Result Value Ref Range   WBC 9.7 4.0 - 10.5 K/uL   RBC 5.09 4.22 - 5.81 MIL/uL   Hemoglobin 16.6 13.0 - 17.0  g/dL   HCT 49.6 39.0 - 52.0 %   MCV 97.4 80.0 - 100.0 fL   MCH 32.6 26.0 - 34.0 pg   MCHC 33.5 30.0 - 36.0 g/dL   RDW 13.1 11.5 - 15.5 %   Platelets 324 150 - 400 K/uL   nRBC 0.0 0.0 - 0.2 %    Comment: Performed at [facility], [place] [address] [place], [place] [address]  Dilantin (phenytoin) Level (if patient is taking this medication)     Status: None   Collection  Time: 01/03/19  2:27 PM  Result Value Ref Range   Phenytoin Lvl 16.1 10.0 - 20.0 ug/mL    Comment: Performed at [facility], [place] [address], [place], [place] [address]  CBG monitoring, ED     Status: None   Collection Time: 01/03/19  2:28 PM  Result Value Ref Range   Glucose-Capillary 99 70 - 99 mg/dL    No results found.  Impression/Plan   58 y.o. male with recent admission for left sided tremor/twitching who was found to have recurrent right parasagittal meningioma who now presents with worsening left sided twitching/tremor. He is unable to perform ADLs at home due to severity of tremors. Since being in ED and given ativan, he has not had any further seizures. Will admit for observation. Will advise Dr Vertell Limber of patients admission so he can f/u tomorrow and determine whether surgery needs to be expedited during this admission. - Continue current antiepileptics - Ativan prn - CT head to r/o hemorrhage  Ferne Reus, PA-C [facility]

## 2019-01-03 NOTE — ED Notes (Signed)
Bed: ND58 Expected date:  Expected time:  Means of arrival:  Comments: EMS Seizures

## 2019-01-03 NOTE — TOC Initial Note (Signed)
Transition of Care [facility]) - Initial/Assessment Note    Patient Details  Name: Joshua [name] MRN: [id number] Date of Birth: 04/02/1961  [facility]) CM/SW Contact:    Erenest Rasher, RN Phone Number: [phone] 01/03/2019, 2:42 PM  Clinical Narrative:                 NCM spoke to pt and states he was able to get his meds. States he works at [employer]. Reminded pt of his appt scheduled on 01/09/2019 at [facility].   Expected Discharge Plan: Home/Self Care     Patient Goals and CMS Choice        Expected Discharge Plan and Services Expected Discharge Plan: Home/Self Care   Discharge Planning Services: CM Consult                                          Prior Living Arrangements/Services     Patient language and need for interpreter reviewed:: Yes Do you feel safe going back to the place where you live?: Yes      Need for Family Participation in Patient Care: No (Comment) Care giver support system in place?: No (comment)   Criminal Activity/Legal Involvement Pertinent to Current Situation/Hospitalization: No - Comment as needed  Activities of Daily Living      Permission Sought/Granted Permission sought to share information with : Case Manager, PCP Permission granted to share information with : Yes, Verbal Permission Granted              Emotional Assessment   Attitude/Demeanor/Rapport: Engaged Affect (typically observed): Accepting Orientation: : Oriented to Self, Oriented to Place, Oriented to  Time, Oriented to Situation   Psych Involvement: No (comment)  Admission diagnosis:  seizures Patient Active Problem List   Diagnosis Date Noted  . Brain tumor ([place]) 12/27/2018  . Diffuse idiopathic skeletal hyperostosis 06/05/2013   PCP:  Patient, No Pcp Per Pharmacy:   [facility] [place], [place]  [address] Phone: [phone] Fax: [place], [place] [address] [place] [address] Phone: [phone] Fax: [phone]     Social Determinants of Health (SDOH) Interventions    Readmission Risk Interventions No flowsheet data found.

## 2019-01-03 NOTE — ED Triage Notes (Signed)
Pt BIB EMS from home. Pt had grand mal seizure about 8 days ago and they discovered a benign brain tumor when he went to the hospital. Pt had severe tremors today and had an anxiety attack with EMS. Pt is very anxious about his condition. Pt took Keppra and Xanax. Pt placed on 2L O2 for comfort.  20G LAC 130/90 RR 16 100% RA CBG 141

## 2019-01-03 NOTE — ED Provider Notes (Signed)
[facility] DEPT Provider Note   CSN: [id number] Arrival date & time: 01/03/19  1405    History   Chief Complaint Chief Complaint  Patient presents with  . Seizures    HPI Joshua [name] is a 58 y.o. male.     HPI Patient has known meningioma.  Recently discharged from hospital.  Has had tremors in the left coming from the tumor.  Had had tonic-clonic seizure also.  Admitted to the hospital and it seems as if the tremors were not thought to be seizure activity.  However patient is on Keppra and Dilantin.  Tremors have worsened.  Saw Dr. Vertell Limber, his neurosurgeon yesterday and started on Xanax.  Continued symptoms.  States it is been affecting his life for cannot walk with it.  States it is painful.  Mostly on left leg but also goes up to left arm at times.  No headache.  No confusion.  No fevers. Past Medical History:  Diagnosis Date  . Brain tumor ([place]) 02/20/2013   brain tumor removed in March 2014, Dr Donald Pore  . Dizziness   . Enlarged prostate   . Headache(784.0)    scattered  . Meningioma [facility])     Patient Active Problem List   Diagnosis Date Noted  . Brain tumor ([place]) 12/27/2018  . Diffuse idiopathic skeletal hyperostosis 06/05/2013    Past Surgical History:  Procedure Laterality Date  . CATARACT EXTRACTION Right   . CRANIOTOMY Right 11/06/2012   Procedure: CRANIOTOMY TUMOR EXCISION;  Surgeon: Erline Levine, MD;  Location: [facility];  Service: Neurosurgery;  Laterality: Right;  Right Parasagittal craniotomy for meningioma with [facility]  . ESOPHAGOGASTRODUODENOSCOPY    . right knee arthroscopy          Home Medications    Prior to Admission medications   Medication Sig Start Date End Date Taking? Authorizing Provider  dexamethasone (DECADRON) 4 MG tablet Take 1 tablet (4 mg total) by mouth 2 (two) times daily with a meal. 12/31/18   Erline Levine, MD  levETIRAcetam (KEPPRA) 500 MG tablet Take 3 tablets (1,500 mg total) by mouth  2 (two) times daily. 12/31/18   Erline Levine, MD  phenytoin (DILANTIN) 300 MG ER capsule Take 1 capsule (300 mg total) by mouth at bedtime. 12/31/18   Erline Levine, MD    Family History History reviewed. No pertinent family history.  Social History Social History   Tobacco Use  . Smoking status: Current Some Day Smoker    Types: Cigarettes  . Smokeless tobacco: Never Used  . Tobacco comment: hasn't smoked in a week  Substance Use Topics  . Alcohol use: Yes    Alcohol/week: 8.0 standard drinks    Types: 6 Cans of beer, 2 Shots of liquor per week    Comment: occasionally  . Drug use: No     Allergies   Patient has no known allergies.   Review of Systems Review of Systems  Constitutional: Negative for appetite change.  Respiratory: Negative for shortness of breath.   Cardiovascular: Negative for chest pain.  Gastrointestinal: Negative for abdominal pain.  Genitourinary: Negative for frequency.  Musculoskeletal: Negative for back pain.  Skin: Negative for rash.  Neurological: Positive for tremors. Negative for headaches.  Psychiatric/Behavioral: Negative for confusion.     Physical Exam Updated Vital Signs BP 107/75   Pulse 60   Temp 98.4 F (36.9 C) (Oral)   Resp 17   SpO2 99%   Physical Exam Vitals signs and nursing note  reviewed.  HENT:     Head: Normocephalic.  Eyes:     Pupils: Pupils are equal, round, and reactive to light.  Cardiovascular:     Rate and Rhythm: Normal rate.  Pulmonary:     Effort: Pulmonary effort is normal.  Abdominal:     Tenderness: There is no abdominal tenderness.  Skin:    General: Skin is warm.  Neurological:     Mental Status: He is alert and oriented to person, place, and time.     Comments: Good grip strength in bilateral upper extremities.  Patient does not appear to have clonus in the lower extremities, however with movement of the leg he will have recurrent myoclonic activity.  Still able to move some with this.   Sensation intact.  Psychiatric:        Mood and Affect: Mood normal.      ED Treatments / Results  Labs (all labs ordered are listed, but only abnormal results are displayed) Labs Reviewed  BASIC METABOLIC PANEL - Abnormal; Notable for the following components:      Result Value   Glucose, Bld 110 (*)    All other components within normal limits  CBC  PHENYTOIN LEVEL, TOTAL  CBG MONITORING, ED    EKG None  Radiology No results found.  Procedures Procedures (including critical care time)  Medications Ordered in ED Medications  LORazepam (ATIVAN) injection 1 mg (has no administration in time range)     Initial Impression / Assessment and Plan / ED Course  I have reviewed the triage vital signs and the nursing notes.  Pertinent labs & imaging results that were available during my care of the patient were reviewed by me and considered in my medical decision making (see chart for details).        Patient is on steroids currently for meningioma.  Having some tremors likely from the tumor.  Discussed with Dr. Vertell Limber who wanted discussion with neurology about the possibility if this is seizure activity with the tremoring.  Discussed with Dr. Malen Gauze, who saw the patient while he is in the hospital is familiar with him.  States this is not seizure activity it is upper motor neuron findings from the tumor.  Patient continues to be on steroids.  Thought that benzos may help some.  States patient also had the long term EEG that did not show seizure activity even during episodes of the movement.  Patient may require admission to the hospital possibly move up with surgery which is not scheduled until July 1.  However patient also has had dental infection that was going to be treated before the surgery.  Will discuss with neurosurgery about potential admission.  Patient is not been able to control the tremors and off for managing at home.  Final Clinical Impressions(s) / ED Diagnoses    Final diagnoses:  Tremor  Meningioma [facility])    ED Discharge Orders    None       Davonna Belling, MD 01/03/19 1727

## 2019-01-03 NOTE — ED Notes (Signed)
ED TO INPATIENT HANDOFF REPORT  Name/Age/Gender Joshua [name] 58 y.o. male  Code Status    Code Status Orders  (From admission, onward)         Start     Ordered   01/03/19 1802  Full code  Continuous     01/03/19 1803        Code Status History    Date Active Date Inactive Code Status Order ID Comments User Context   12/27/2018 2038 12/31/2018 1733 Full Code [id number]  Erline Levine, MD Inpatient   Advance Care Planning Activity      Home/SNF/Other [place] [facility]  Chief Complaint seizures  Level of Care/Admitting Diagnosis ED Disposition    ED Disposition Condition [place]: [place] [[id number]]  Level of Care: Progressive [102]  Covid Evaluation: Screening Protocol (No Symptoms)  Diagnosis: Meningioma [facility]) [[id number]]  Admitting Physician: Consuella Lose [[id number]]  Attending Physician: Erline Levine [[id number]]  Estimated length of stay: past midnight tomorrow  Certification:: I certify this patient will need inpatient services for at least 2 midnights  Bed request comments: 4NP  PT Class (Do Not Modify): Inpatient [101]  PT Acc Code (Do Not Modify): Private [1]       Medical History Past Medical History:  Diagnosis Date  . Brain tumor ([place]) 02/20/2013   brain tumor removed in March 2014, Dr Donald Pore  . Dizziness   . Enlarged prostate   . Headache(784.0)    scattered  . Meningioma ([place])     Allergies No Known Allergies  IV Location/Drains/Wounds Patient Lines/Drains/Airways Status   Active Line/Drains/Airways    Name:   Placement date:   Placement time:   Site:   Days:   Peripheral IV 01/03/19 Left Antecubital   01/03/19    1226    Antecubital   less than 1          Labs/Imaging Results for orders placed or performed during the hospital encounter of 01/03/19 (from the past 48 hour(s))  Basic metabolic panel - if new onset seizures     Status: Abnormal   Collection Time: 01/03/19  2:27 PM  Result  Value Ref Range   Sodium 141 135 - 145 mmol/L   Potassium 3.8 3.5 - 5.1 mmol/L   Chloride 106 98 - 111 mmol/L   CO2 26 22 - 32 mmol/L   Glucose, Bld 110 (H) 70 - 99 mg/dL   BUN 15 6 - 20 mg/dL   Creatinine, Ser 0.82 0.61 - 1.24 mg/dL   Calcium 9.4 8.9 - 10.3 mg/dL   GFR calc non Af Amer >60 >60 mL/min   GFR calc Af Amer >60 >60 mL/min   Anion gap 9 5 - 15    Comment: Performed at [facility], [place] [address], [place], [place] [address]  CBC - if new onset seizures     Status: None   Collection Time: 01/03/19  2:27 PM  Result Value Ref Range   WBC 9.7 4.0 - 10.5 K/uL   RBC 5.09 4.22 - 5.81 MIL/uL   Hemoglobin 16.6 13.0 - 17.0 g/dL   HCT 49.6 39.0 - 52.0 %   MCV 97.4 80.0 - 100.0 fL   MCH 32.6 26.0 - 34.0 pg   MCHC 33.5 30.0 - 36.0 g/dL   RDW 13.1 11.5 - 15.5 %   Platelets 324 150 - 400 K/uL   nRBC 0.0 0.0 - 0.2 %    Comment: Performed at  [facility], [place] [address] [place], [place] [address]  Dilantin (phenytoin) Level (if patient is taking this medication)     Status: None   Collection Time: 01/03/19  2:27 PM  Result Value Ref Range   Phenytoin Lvl 16.1 10.0 - 20.0 ug/mL    Comment: Performed at [facility], [place] [address] [place], [place] [address]  CBG monitoring, ED     Status: None   Collection Time: 01/03/19  2:28 PM  Result Value Ref Range   Glucose-Capillary 99 70 - 99 mg/dL  SARS Coronavirus 2 (CEPHEID - Performed in [place] hospital lab), Hosp Order     Status: None   Collection Time: 01/03/19  5:30 PM   Specimen: Nasopharyngeal Swab  Result Value Ref Range   SARS Coronavirus 2 NEGATIVE NEGATIVE    Comment: (NOTE) If result is NEGATIVE SARS-CoV-2 target nucleic acids are NOT DETECTED. The SARS-CoV-2 RNA is generally detectable in upper and lower  respiratory specimens during the acute phase of infection. The lowest  concentration of SARS-CoV-2 viral copies this assay can detect is 250  copies /  mL. A negative result does not preclude SARS-CoV-2 infection  and should not be used as the sole basis for treatment or other  patient management decisions.  A negative result may occur with  improper specimen collection / handling, submission of specimen other  than nasopharyngeal swab, presence of viral mutation(s) within the  areas targeted by this assay, and inadequate number of viral copies  (<250 copies / mL). A negative result must be combined with clinical  observations, patient history, and epidemiological information. If result is POSITIVE SARS-CoV-2 target nucleic acids are DETECTED. The SARS-CoV-2 RNA is generally detectable in upper and lower  respiratory specimens dur ing the acute phase of infection.  Positive  results are indicative of active infection with SARS-CoV-2.  Clinical  correlation with patient history and other diagnostic information is  necessary to determine patient infection status.  Positive results do  not rule out bacterial infection or co-infection with other viruses. If result is PRESUMPTIVE POSTIVE SARS-CoV-2 nucleic acids MAY BE PRESENT.   A presumptive positive result was obtained on the submitted specimen  and confirmed on repeat testing.  While 2019 novel coronavirus  (SARS-CoV-2) nucleic acids may be present in the submitted sample  additional confirmatory testing may be necessary for epidemiological  and / or clinical management purposes  to differentiate between  SARS-CoV-2 and other Sarbecovirus currently known to infect humans.  If clinically indicated additional testing with an alternate test  methodology [phone]) is advised. The SARS-CoV-2 RNA is generally  detectable in upper and lower respiratory sp ecimens during the acute  phase of infection. The expected result is Negative. Fact Sheet for Patients:  StrictlyIdeas.no Fact Sheet for Healthcare Providers: BankingDealers.co.za This test is  not yet approved or cleared by the [place] FDA and has been authorized for detection and/or diagnosis of SARS-CoV-2 by FDA under an Emergency Use Authorization (EUA).  This EUA will remain in effect (meaning this test can be used) for the duration of the COVID-19 declaration under Section 564(b)(1) of the Act, 21 U.S.C. section 360bbb-3(b)(1), unless the authorization is terminated or revoked sooner. Performed at [facility], [place] [address] [place], [place] [address]    Ct Head Wo Contrast  Result Date: 01/03/2019 CLINICAL DATA:  Worsening tremor. Known meningioma. EXAM: CT HEAD WITHOUT CONTRAST TECHNIQUE: Contiguous axial images were obtained from the base of  the skull through the vertex without intravenous contrast. COMPARISON:  Head CT and brain MRI 1 week prior 12/27/2018 FINDINGS: Brain: Known right parafalcine extradural mass with peripheral calcifications measures approximately 5 x 2.2 x 3.0 cm, not significantly changed from prior exam allowing for differences in scan plane and caliper placement. Mild adjacent vasogenic edema in the right parietal lobe. Overall findings are not significantly changed from prior. Minimal right to left midline shift of the superior falx, also unchanged. No hemorrhage. No hydrocephalus. No evidence of acute ischemia. No intracranial fluid collection. Vascular: No hyperdense vessel. Skull: Prior right parietal and vertex craniotomy. Cortical irregularity of the craniotomy defect in the region of meningioma. No new osseous abnormality. Sinuses/Orbits: No acute finding. Other: None. IMPRESSION: 1. Unchanged head CT over the past week. Right parafalcine meningioma is unchanged in size allowing for differences in scan plane and caliper placement. Mild adjacent vasogenic edema in the right parietal lobe is also unchanged. 2. No new abnormality. Electronically Signed   By: Keith Rake M.D.   On: 01/03/2019 19:13    Pending  Labs Unresulted Labs (From admission, onward)   None      Vitals/Pain Today's Vitals   01/03/19 1430 01/03/19 1500 01/03/19 1640 01/03/19 1800  BP: 121/81 125/64 107/75 (!) 122/100  Pulse: 64 71 60 74  Resp: 15 13 17 10   Temp:      TempSrc:      SpO2: 99% 99% 99% 98%    Isolation Precautions No active isolations  Medications Medications  dexamethasone (DECADRON) tablet 4 mg (has no administration in time range)  levETIRAcetam (KEPPRA) tablet 1,500 mg (has no administration in time range)  phenytoin (DILANTIN) ER capsule 300 mg (has no administration in time range)  ondansetron (ZOFRAN) tablet 4 mg (has no administration in time range)    Or  ondansetron (ZOFRAN) injection 4 mg (has no administration in time range)  sodium phosphate (FLEET) 7-19 GM/118ML enema 1 enema (has no administration in time range)  bisacodyl (DULCOLAX) EC tablet 5 mg (has no administration in time range)  senna-docusate (Senokot-S) tablet 1 tablet (has no administration in time range)  docusate sodium (COLACE) capsule 100 mg (has no administration in time range)  zolpidem (AMBIEN) tablet 5 mg (has no administration in time range)  HYDROcodone-acetaminophen (NORCO/VICODIN) 5-325 MG per tablet 1-2 tablet (has no administration in time range)  acetaminophen (TYLENOL) tablet 650 mg (has no administration in time range)    Or  acetaminophen (TYLENOL) suppository 650 mg (has no administration in time range)  sodium chloride flush (NS) 0.9 % injection 3 mL (has no administration in time range)  sodium chloride flush (NS) 0.9 % injection 3 mL (has no administration in time range)  0.9 %  sodium chloride infusion (has no administration in time range)  0.9 %  sodium chloride infusion ( Intravenous Hold 01/03/19 1954)  LORazepam (ATIVAN) tablet 0.5-1 mg (has no administration in time range)    Or  LORazepam (ATIVAN) injection 0.5-2 mg (has no administration in time range)  LORazepam (ATIVAN) injection 1 mg (1  mg Intravenous Given 01/03/19 1734)    Mobility walks

## 2019-01-04 MED ORDER — DEXAMETHASONE 4 MG PO TABS
4.0000 mg | ORAL_TABLET | Freq: Four times a day (QID) | ORAL | Status: DC
  Administered 2019-01-04 – 2019-01-09 (×22): 4 mg via ORAL
  Filled 2019-01-04 (×24): qty 1

## 2019-01-04 MED ORDER — OXYCODONE-ACETAMINOPHEN 5-325 MG PO TABS
1.0000 | ORAL_TABLET | ORAL | Status: DC | PRN
  Administered 2019-01-04 – 2019-01-11 (×8): 2 via ORAL
  Filled 2019-01-04 (×9): qty 2

## 2019-01-04 MED ORDER — LORAZEPAM 2 MG/ML IJ SOLN
2.0000 mg | INTRAMUSCULAR | Status: DC | PRN
  Administered 2019-01-04 – 2019-01-08 (×3): 2 mg via INTRAVENOUS
  Filled 2019-01-04 (×3): qty 1

## 2019-01-04 NOTE — Progress Notes (Signed)
Contacted RN Tammy and spoke with Janett Billow at Dr. Vertell Limber office concerning MRI order.  Currently scanner for BrainLab protocol is being upgraded and should be up and running next week.  Janett Billow and Tammy said pt surgery was scheduled for Thursday 01/10/19 so will do exam early next week before surgery.  Could not talk with Dr. Vertell Limber because he would be out of the office until next week was going to ask if [facility] on 1.5T would be considered if he wanted exam completed sooner.

## 2019-01-04 NOTE — Progress Notes (Addendum)
Subjective: Patient reports "I just couldn't stop shaking ... my leg and my shoulder.. it just wore me out"  Objective: Vital signs in last 24 hours: Temp:  [97.8 F (36.6 C)-98.4 F (36.9 C)] 97.9 F (36.6 C) (06/19 0836) Pulse Rate:  [60-74] 70 (06/19 0836) Resp:  [10-33] 18 (06/19 0836) BP: (107-126)/(64-100) 113/84 (06/19 0836) SpO2:  [91 %-100 %] 100 % (06/19 0836)  Intake/Output from previous day: No intake/output data recorded. Intake/Output this shift: No intake/output data recorded.  Alert, conversant. Reports no pain. Calm at present, participates in exam; but demonstrates significant anxiety discussing need for dental eval, increasing spasticity/tremor, and home environment. Eval reveals good strength left deltoid (good BUE), and good strength BLE. PEARL. Fluent speech.   Of note, pt reports tooth issue was simply pain when grinding teeth while dealing with leg spasm. Dental eval revealed cavity on x-ray. No mention of abscess and no symptoms currently.   Lab Results: Recent Labs    01/03/19 1427  WBC 9.7  HGB 16.6  HCT 49.6  PLT 324   BMET Recent Labs    01/03/19 1427  NA 141  K 3.8  CL 106  CO2 26  GLUCOSE 110*  BUN 15  CREATININE 0.82  CALCIUM 9.4    Studies/Results: Ct Head Wo Contrast  Result Date: 01/03/2019 CLINICAL DATA:  Worsening tremor. Known meningioma. EXAM: CT HEAD WITHOUT CONTRAST TECHNIQUE: Contiguous axial images were obtained from the base of the skull through the vertex without intravenous contrast. COMPARISON:  Head CT and brain MRI 1 week prior 12/27/2018 FINDINGS: Brain: Known right parafalcine extradural mass with peripheral calcifications measures approximately 5 x 2.2 x 3.0 cm, not significantly changed from prior exam allowing for differences in scan plane and caliper placement. Mild adjacent vasogenic edema in the right parietal lobe. Overall findings are not significantly changed from prior. Minimal right to left midline shift of  the superior falx, also unchanged. No hemorrhage. No hydrocephalus. No evidence of acute ischemia. No intracranial fluid collection. Vascular: No hyperdense vessel. Skull: Prior right parietal and vertex craniotomy. Cortical irregularity of the craniotomy defect in the region of meningioma. No new osseous abnormality. Sinuses/Orbits: No acute finding. Other: None. IMPRESSION: 1. Unchanged head CT over the past week. Right parafalcine meningioma is unchanged in size allowing for differences in scan plane and caliper placement. Mild adjacent vasogenic edema in the right parietal lobe is also unchanged. 2. No new abnormality. Electronically Signed   By: Keith Rake M.D.   On: 01/03/2019 19:13    Assessment/Plan: stable  LOS: 1 day  Reassured pt. Plan to move surgery to the 25th from the 1st.    Verdis Prime 01/04/2019, 8:51 AM  Because of seizure activty, patient will be observed in hospital and we will move his surgery up.

## 2019-01-04 NOTE — Progress Notes (Signed)
Patient ID: Joshua [name], male   DOB: 10/05/1960, 58 y.o.   MRN: [id number] Per Dr. Vertell Limber, increase Decadron 4mg  to qid po. BrainLab Protocol MRI brain with & without contrast for surgical planning. Orders entered.

## 2019-01-05 MED ORDER — ALUM & MAG HYDROXIDE-SIMETH 200-200-20 MG/5ML PO SUSP
30.0000 mL | Freq: Four times a day (QID) | ORAL | Status: DC | PRN
  Administered 2019-01-05 – 2019-01-09 (×4): 30 mL via ORAL
  Filled 2019-01-05 (×4): qty 30

## 2019-01-05 NOTE — Progress Notes (Signed)
° °  Providing Compassionate, Quality Care - Together   Subjective: Patient reports no issues overnight. Denies pain. Reports no tremors overnight.  Objective: Vital signs in last 24 hours: Temp:  [98 F (36.7 C)-99 F (37.2 C)] 98.7 F (37.1 C) (06/20 0851) Pulse Rate:  [25-97] 76 (06/20 0851) Resp:  [15-20] 15 (06/20 0851) BP: (114-142)/(74-94) 123/82 (06/20 0851) SpO2:  [96 %-99 %] 97 % (06/20 0851)  Intake/Output from previous day: 06/19 0701 - 06/20 0700 In: -  Out: 175 [Urine:175] Intake/Output this shift: Total I/O In: -  Out: 275 [Urine:275]  Alert and oriented x 4 PERRLA MAE   Lab Results: Recent Labs    01/03/19 1427  WBC 9.7  HGB 16.6  HCT 49.6  PLT 324   BMET Recent Labs    01/03/19 1427  NA 141  K 3.8  CL 106  CO2 26  GLUCOSE 110*  BUN 15  CREATININE 0.82  CALCIUM 9.4    Studies/Results: Ct Head Wo Contrast  Result Date: 01/03/2019 CLINICAL DATA:  Worsening tremor. Known meningioma. EXAM: CT HEAD WITHOUT CONTRAST TECHNIQUE: Contiguous axial images were obtained from the base of the skull through the vertex without intravenous contrast. COMPARISON:  Head CT and brain MRI 1 week prior 12/27/2018 FINDINGS: Brain: Known right parafalcine extradural mass with peripheral calcifications measures approximately 5 x 2.2 x 3.0 cm, not significantly changed from prior exam allowing for differences in scan plane and caliper placement. Mild adjacent vasogenic edema in the right parietal lobe. Overall findings are not significantly changed from prior. Minimal right to left midline shift of the superior falx, also unchanged. No hemorrhage. No hydrocephalus. No evidence of acute ischemia. No intracranial fluid collection. Vascular: No hyperdense vessel. Skull: Prior right parietal and vertex craniotomy. Cortical irregularity of the craniotomy defect in the region of meningioma. No new osseous abnormality. Sinuses/Orbits: No acute finding. Other: None. IMPRESSION:  1. Unchanged head CT over the past week. Right parafalcine meningioma is unchanged in size allowing for differences in scan plane and caliper placement. Mild adjacent vasogenic edema in the right parietal lobe is also unchanged. 2. No new abnormality. Electronically Signed   By: Keith Rake M.D.   On: 01/03/2019 19:13    Assessment/Plan: Patient with recurrent meningioma. Developed left-sided twitching/jerks on 12/28/2018. Discharged home on dilantin and Keppra on 12/31/2018 after 48 hours seizure-free. Once home, he developed significant LLE twitching and returned to [facility] for further evaluation. Patient with extreme anxiety.   LOS: 2 days    -Surgery moved up to 01/10/2019 from 01/16/2019 by Dr. Vertell Limber -Call neurosurgery if any seizure activity -Continue to mobilize   Viona Gilmore, DNP, AGNP-C Nurse Practitioner  [facility] [place]. [address] [place], [place], [place] [address] P: [phone]     F: [phone]  01/05/2019, 10:34 AM

## 2019-01-06 NOTE — Progress Notes (Signed)
Overall stable.  No headaches.  No new seizure activity.  Tolerating medications well.  Neurologic exam stable.  Continue hospital observation.  Plan for surgical resection of interhemispheric meningioma later this coming week.

## 2019-01-07 NOTE — Progress Notes (Signed)
  NEUROSURGERY PROGRESS NOTE   Had approx 25min episode of LLE tremor/twitching resolved with ativan No concerns this am Eager for surgery this week  EXAM:  BP 113/79 (BP Location: Left Arm)   Pulse 65   Temp 98.2 F (36.8 C) (Oral)   Resp 20   SpO2 99%   Awake, alert, oriented  Speech fluent, appropriate  CN grossly intact  Grossly normal BUE and RLE, mild LLE weakness with DF  PLAN Stable this am Plan for surgery Thursday Ativan prn

## 2019-01-08 ENCOUNTER — Inpatient Hospital Stay (HOSPITAL_COMMUNITY): Payer: Medicaid Other

## 2019-01-08 MED ORDER — GADOBUTROL 1 MMOL/ML IV SOLN
10.0000 mL | Freq: Once | INTRAVENOUS | Status: AC | PRN
  Administered 2019-01-08: 10 mL via INTRAVENOUS

## 2019-01-08 NOTE — Plan of Care (Signed)
  Problem: Education: Goal: Expressions of having a comfortable level of knowledge regarding the disease process will increase Outcome: Progressing   Problem: Coping: Goal: Ability to adjust to condition or change in health will improve Outcome: Progressing Goal: Ability to identify appropriate support needs will improve Outcome: Progressing   Problem: Health Behavior/Discharge Planning: Goal: Compliance with prescribed medication regimen will improve Outcome: Progressing   Problem: Medication: Goal: Risk for medication side effects will decrease Outcome: Progressing   Problem: Safety: Goal: Verbalization of understanding the information provided will improve Outcome: Progressing

## 2019-01-08 NOTE — Progress Notes (Signed)
  NEUROSURGERY PROGRESS NOTE   No issues overnight. No concerns this am  EXAM:  BP 102/66   Pulse (!) 55   Temp 98.2 F (36.8 C) (Oral)   Resp (!) 22   SpO2 98%   Awake, alert, oriented  Speech fluent, appropriate  CN grossly intact  5/5 BUE/BLE, mild LLE weakness  PLAN Stable this am Continue supportive care

## 2019-01-09 ENCOUNTER — Ambulatory Visit: Payer: Self-pay | Admitting: Family Medicine

## 2019-01-09 ENCOUNTER — Inpatient Hospital Stay (HOSPITAL_COMMUNITY): Payer: Medicaid Other

## 2019-01-09 LAB — CBC WITH DIFFERENTIAL/PLATELET
Abs Immature Granulocytes: 0.07 10*3/uL (ref 0.00–0.07)
Basophils Absolute: 0.1 10*3/uL (ref 0.0–0.1)
Basophils Relative: 1 %
Eosinophils Absolute: 0.2 10*3/uL (ref 0.0–0.5)
Eosinophils Relative: 1 %
HCT: 49.5 % (ref 39.0–52.0)
Hemoglobin: 16.6 g/dL (ref 13.0–17.0)
Immature Granulocytes: 1 %
Lymphocytes Relative: 18 %
Lymphs Abs: 2.3 10*3/uL (ref 0.7–4.0)
MCH: 31.9 pg (ref 26.0–34.0)
MCHC: 33.5 g/dL (ref 30.0–36.0)
MCV: 95 fL (ref 80.0–100.0)
Monocytes Absolute: 0.8 10*3/uL (ref 0.1–1.0)
Monocytes Relative: 6 %
Neutro Abs: 8.9 10*3/uL — ABNORMAL HIGH (ref 1.7–7.7)
Neutrophils Relative %: 73 %
Platelets: 321 10*3/uL (ref 150–400)
RBC: 5.21 MIL/uL (ref 4.22–5.81)
RDW: 12.5 % (ref 11.5–15.5)
WBC: 12.3 10*3/uL — ABNORMAL HIGH (ref 4.0–10.5)
nRBC: 0 % (ref 0.0–0.2)

## 2019-01-09 LAB — BASIC METABOLIC PANEL
Anion gap: 9 (ref 5–15)
BUN: 15 mg/dL (ref 6–20)
CO2: 29 mmol/L (ref 22–32)
Calcium: 9.7 mg/dL (ref 8.9–10.3)
Chloride: 102 mmol/L (ref 98–111)
Creatinine, Ser: 0.79 mg/dL (ref 0.61–1.24)
GFR calc Af Amer: >60 mL/min (ref >60)
GFR calc non Af Amer: >60 mL/min (ref >60)
Glucose, Bld: 133 mg/dL — ABNORMAL HIGH (ref 70–99)
Potassium: 4.5 mmol/L (ref 3.5–5.1)
Sodium: 140 mmol/L (ref 135–145)

## 2019-01-09 LAB — TYPE AND SCREEN
ABO/RH(D): A POS
Antibody Screen: NEGATIVE

## 2019-01-09 MED ORDER — CEFAZOLIN SODIUM-DEXTROSE 2-4 GM/100ML-% IV SOLN: 2.0000 g | INTRAVENOUS | Status: DC

## 2019-01-09 MED ORDER — CHLORHEXIDINE GLUCONATE CLOTH 2 % EX PADS
6.0000 | MEDICATED_PAD | Freq: Once | CUTANEOUS | Status: AC
  Administered 2019-01-10: 6 via TOPICAL

## 2019-01-09 MED ORDER — CHLORHEXIDINE GLUCONATE CLOTH 2 % EX PADS
6.0000 | MEDICATED_PAD | Freq: Once | CUTANEOUS | Status: AC
  Administered 2019-01-09: 6 via TOPICAL

## 2019-01-09 MED ORDER — DEXTROSE 5 % IV SOLN
3.0000 g | INTRAVENOUS | Status: AC
  Administered 2019-01-10: 3 g via INTRAVENOUS
  Filled 2019-01-09: qty 3000

## 2019-01-09 NOTE — Plan of Care (Signed)
  Problem: Education: Goal: Expressions of having a comfortable level of knowledge regarding the disease process will increase Outcome: Progressing   Problem: Coping: Goal: Ability to adjust to condition or change in health will improve Outcome: Progressing Goal: Ability to identify appropriate support needs will improve Outcome: Progressing   Problem: Health Behavior/Discharge Planning: Goal: Compliance with prescribed medication regimen will improve Outcome: Progressing   Problem: Medication: Goal: Risk for medication side effects will decrease Outcome: Progressing   Problem: Safety: Goal: Verbalization of understanding the information provided will improve Outcome: Progressing

## 2019-01-09 NOTE — Progress Notes (Addendum)
  NEUROSURGERY PROGRESS NOTE   Mild LLE twitching overnight. Given ativan No concerns this am. Eager for surgery tomorrow.  EXAM:  BP 108/85 (BP Location: Right Arm)   Pulse 60   Temp 98 F (36.7 C) (Oral)   Resp 20   SpO2 97%   Awake, alert, oriented  Speech fluent, appropriate  CN grossly intact  5/5 BUE/BLE, mild LLE weakness  PLAN Stable neurologically. Plan for surgery tomorrow.  NPO at midnight. Pre op orders entered. CBC and BMP

## 2019-01-10 ENCOUNTER — Inpatient Hospital Stay (HOSPITAL_COMMUNITY): Payer: Medicaid Other | Admitting: Anesthesiology

## 2019-01-10 ENCOUNTER — Inpatient Hospital Stay (HOSPITAL_COMMUNITY): Admission: RE | Admit: 2019-01-10 | Payer: Self-pay | Source: Home / Self Care | Admitting: Neurosurgery

## 2019-01-10 ENCOUNTER — Encounter (HOSPITAL_COMMUNITY)
Admission: EM | Disposition: A | Discharge status: Home / Self Care | Payer: Self-pay | Source: Home / Self Care | Attending: Neurosurgery

## 2019-01-10 ENCOUNTER — Encounter (HOSPITAL_COMMUNITY): Payer: Self-pay | Admitting: *Deleted

## 2019-01-10 HISTORY — PX: APPLICATION OF CRANIAL NAVIGATION: SHX6578

## 2019-01-10 HISTORY — PX: CRANIOTOMY: SHX93

## 2019-01-10 SURGERY — CRANIOTOMY TUMOR EXCISION
Anesthesia: General | Site: Head | Laterality: Right

## 2019-01-10 MED ORDER — ALPRAZOLAM 0.25 MG PO TABS
0.2500 mg | ORAL_TABLET | Freq: Three times a day (TID) | ORAL | Status: DC
  Administered 2019-01-10 – 2019-01-14 (×11): 0.25 mg via ORAL
  Filled 2019-01-10 (×11): qty 1

## 2019-01-10 MED ORDER — PROPOFOL 10 MG/ML IV BOLUS
INTRAVENOUS | Status: AC
  Filled 2019-01-10: qty 40

## 2019-01-10 MED ORDER — SUGAMMADEX SODIUM 200 MG/2ML IV SOLN
INTRAVENOUS | Status: DC | PRN
  Administered 2019-01-10: 170.6 mg via INTRAVENOUS

## 2019-01-10 MED ORDER — ROCURONIUM BROMIDE 50 MG/5ML IV SOSY
PREFILLED_SYRINGE | INTRAVENOUS | Status: DC | PRN
  Administered 2019-01-10 (×2): 20 mg via INTRAVENOUS
  Administered 2019-01-10: 30 mg via INTRAVENOUS
  Administered 2019-01-10: 50 mg via INTRAVENOUS

## 2019-01-10 MED ORDER — LIDOCAINE-EPINEPHRINE 1 %-1:100000 IJ SOLN
INTRAMUSCULAR | Status: AC
  Filled 2019-01-10: qty 1

## 2019-01-10 MED ORDER — THROMBIN 20000 UNITS EX SOLR
CUTANEOUS | Status: DC | PRN
  Administered 2019-01-10: 20 mL via TOPICAL

## 2019-01-10 MED ORDER — LIDOCAINE-EPINEPHRINE 1 %-1:100000 IJ SOLN
INTRAMUSCULAR | Status: DC | PRN
  Administered 2019-01-10: 5 mL

## 2019-01-10 MED ORDER — BUPIVACAINE HCL (PF) 0.5 % IJ SOLN
INTRAMUSCULAR | Status: AC
  Filled 2019-01-10: qty 30

## 2019-01-10 MED ORDER — HEMOSTATIC AGENTS (NO CHARGE) OPTIME
TOPICAL | Status: DC | PRN
  Administered 2019-01-10: 1 via TOPICAL

## 2019-01-10 MED ORDER — DEXAMETHASONE SODIUM PHOSPHATE 10 MG/ML IJ SOLN
6.0000 mg | Freq: Four times a day (QID) | INTRAMUSCULAR | Status: AC
  Administered 2019-01-10 – 2019-01-11 (×4): 6 mg via INTRAVENOUS
  Filled 2019-01-10 (×4): qty 1

## 2019-01-10 MED ORDER — HYDROCODONE-ACETAMINOPHEN 5-325 MG PO TABS: 1.0000 | ORAL_TABLET | ORAL | Status: DC | PRN

## 2019-01-10 MED ORDER — BACITRACIN ZINC 500 UNIT/GM EX OINT
TOPICAL_OINTMENT | CUTANEOUS | Status: AC
  Filled 2019-01-10: qty 28.35

## 2019-01-10 MED ORDER — THROMBIN 5000 UNITS EX SOLR
CUTANEOUS | Status: AC
  Filled 2019-01-10: qty 5000

## 2019-01-10 MED ORDER — MORPHINE SULFATE (PF) 2 MG/ML IV SOLN
1.0000 mg | INTRAVENOUS | Status: DC | PRN
  Administered 2019-01-10 – 2019-01-11 (×5): 2 mg via INTRAVENOUS
  Administered 2019-01-11: 1 mg via INTRAVENOUS
  Administered 2019-01-11: 2 mg via INTRAVENOUS
  Filled 2019-01-10 (×6): qty 1

## 2019-01-10 MED ORDER — ONDANSETRON HCL 4 MG/2ML IJ SOLN
4.0000 mg | INTRAMUSCULAR | Status: DC | PRN
  Administered 2019-01-11 – 2019-01-14 (×2): 4 mg via INTRAVENOUS
  Filled 2019-01-10 (×2): qty 2

## 2019-01-10 MED ORDER — OXYCODONE HCL 5 MG PO TABS
5.0000 mg | ORAL_TABLET | Freq: Once | ORAL | Status: AC | PRN
  Administered 2019-01-10: 5 mg via ORAL

## 2019-01-10 MED ORDER — ONDANSETRON HCL 4 MG PO TABS
4.0000 mg | ORAL_TABLET | ORAL | Status: DC | PRN
  Administered 2019-01-12: 4 mg via ORAL
  Filled 2019-01-10: qty 1

## 2019-01-10 MED ORDER — POTASSIUM CHLORIDE IN NACL 20-0.9 MEQ/L-% IV SOLN
INTRAVENOUS | Status: DC
  Administered 2019-01-10: 18:00:00 via INTRAVENOUS
  Filled 2019-01-10 (×2): qty 1000

## 2019-01-10 MED ORDER — ROCURONIUM BROMIDE 10 MG/ML (PF) SYRINGE
PREFILLED_SYRINGE | INTRAVENOUS | Status: AC
  Filled 2019-01-10: qty 10

## 2019-01-10 MED ORDER — FENTANYL CITRATE (PF) 100 MCG/2ML IJ SOLN
25.0000 ug | INTRAMUSCULAR | Status: DC | PRN
  Administered 2019-01-10 (×2): 50 ug via INTRAVENOUS

## 2019-01-10 MED ORDER — OXYCODONE HCL 5 MG/5ML PO SOLN: 5.0000 mg | Freq: Once | ORAL | Status: AC | PRN

## 2019-01-10 MED ORDER — THROMBIN 5000 UNITS EX SOLR
OROMUCOSAL | Status: DC | PRN
  Administered 2019-01-10: 5 mL via TOPICAL

## 2019-01-10 MED ORDER — FENTANYL CITRATE (PF) 250 MCG/5ML IJ SOLN
INTRAMUSCULAR | Status: AC
  Filled 2019-01-10: qty 5

## 2019-01-10 MED ORDER — DEXAMETHASONE SODIUM PHOSPHATE 10 MG/ML IJ SOLN
INTRAMUSCULAR | Status: DC | PRN
  Administered 2019-01-10: 10 mg via INTRAVENOUS

## 2019-01-10 MED ORDER — 0.9 % SODIUM CHLORIDE (POUR BTL) OPTIME
TOPICAL | Status: DC | PRN
  Administered 2019-01-10 (×3): 1000 mL

## 2019-01-10 MED ORDER — MIDAZOLAM HCL 5 MG/5ML IJ SOLN
INTRAMUSCULAR | Status: DC | PRN
  Administered 2019-01-10: 2 mg via INTRAVENOUS

## 2019-01-10 MED ORDER — LIDOCAINE 2% (20 MG/ML) 5 ML SYRINGE
INTRAMUSCULAR | Status: AC
  Filled 2019-01-10: qty 5

## 2019-01-10 MED ORDER — MIDAZOLAM HCL 2 MG/2ML IJ SOLN
INTRAMUSCULAR | Status: AC
  Filled 2019-01-10: qty 2

## 2019-01-10 MED ORDER — FENTANYL CITRATE (PF) 100 MCG/2ML IJ SOLN
INTRAMUSCULAR | Status: AC
  Filled 2019-01-10: qty 2

## 2019-01-10 MED ORDER — THROMBIN 20000 UNITS EX SOLR
CUTANEOUS | Status: AC
  Filled 2019-01-10: qty 20000

## 2019-01-10 MED ORDER — SODIUM CHLORIDE 0.9 % IV SOLN
INTRAVENOUS | Status: DC | PRN
  Administered 2019-01-10: 20 ug/min via INTRAVENOUS

## 2019-01-10 MED ORDER — ACETAMINOPHEN 650 MG RE SUPP: 650.0000 mg | RECTAL | Status: DC | PRN

## 2019-01-10 MED ORDER — BUPIVACAINE HCL (PF) 0.5 % IJ SOLN
INTRAMUSCULAR | Status: DC | PRN
  Administered 2019-01-10: 5 mL

## 2019-01-10 MED ORDER — DOCUSATE SODIUM 100 MG PO CAPS
100.0000 mg | ORAL_CAPSULE | Freq: Two times a day (BID) | ORAL | Status: DC
  Administered 2019-01-10 – 2019-01-12 (×5): 100 mg via ORAL
  Filled 2019-01-10 (×5): qty 1

## 2019-01-10 MED ORDER — MORPHINE SULFATE (PF) 2 MG/ML IV SOLN
INTRAVENOUS | Status: AC
  Filled 2019-01-10: qty 1

## 2019-01-10 MED ORDER — ONDANSETRON HCL 4 MG/2ML IJ SOLN
INTRAMUSCULAR | Status: DC | PRN
  Administered 2019-01-10: 4 mg via INTRAVENOUS

## 2019-01-10 MED ORDER — OXYCODONE HCL 5 MG PO TABS
ORAL_TABLET | ORAL | Status: AC
  Filled 2019-01-10: qty 1

## 2019-01-10 MED ORDER — FENTANYL CITRATE (PF) 100 MCG/2ML IJ SOLN
INTRAMUSCULAR | Status: DC | PRN
  Administered 2019-01-10 (×3): 50 ug via INTRAVENOUS
  Administered 2019-01-10: 100 ug via INTRAVENOUS

## 2019-01-10 MED ORDER — SUCCINYLCHOLINE CHLORIDE 20 MG/ML IJ SOLN
INTRAMUSCULAR | Status: DC | PRN
  Administered 2019-01-10: 120 mg via INTRAVENOUS

## 2019-01-10 MED ORDER — POLYETHYLENE GLYCOL 3350 17 G PO PACK
17.0000 g | PACK | Freq: Every day | ORAL | Status: DC | PRN
  Administered 2019-01-13: 17 g via ORAL
  Filled 2019-01-10: qty 1

## 2019-01-10 MED ORDER — BACITRACIN ZINC 500 UNIT/GM EX OINT
TOPICAL_OINTMENT | CUTANEOUS | Status: DC | PRN
  Administered 2019-01-10: 1 via TOPICAL

## 2019-01-10 MED ORDER — PANTOPRAZOLE SODIUM 40 MG IV SOLR
40.0000 mg | Freq: Every day | INTRAVENOUS | Status: DC
  Administered 2019-01-10: 40 mg via INTRAVENOUS
  Filled 2019-01-10: qty 40

## 2019-01-10 MED ORDER — DEXAMETHASONE SODIUM PHOSPHATE 10 MG/ML IJ SOLN
INTRAMUSCULAR | Status: AC
  Filled 2019-01-10: qty 1

## 2019-01-10 MED ORDER — BISACODYL 10 MG RE SUPP: 10.0000 mg | Freq: Every day | RECTAL | Status: DC | PRN

## 2019-01-10 MED ORDER — PROMETHAZINE HCL 25 MG/ML IJ SOLN: 6.2500 mg | INTRAMUSCULAR | Status: DC | PRN

## 2019-01-10 MED ORDER — FLEET ENEMA 7-19 GM/118ML RE ENEM: 1.0000 | ENEMA | Freq: Once | RECTAL | Status: DC | PRN

## 2019-01-10 MED ORDER — ACETAMINOPHEN 325 MG PO TABS: 650.0000 mg | ORAL_TABLET | ORAL | Status: DC | PRN

## 2019-01-10 MED ORDER — DEXAMETHASONE SODIUM PHOSPHATE 4 MG/ML IJ SOLN
4.0000 mg | Freq: Three times a day (TID) | INTRAMUSCULAR | Status: DC
  Administered 2019-01-12 – 2019-01-13 (×2): 4 mg via INTRAVENOUS
  Filled 2019-01-10 (×2): qty 1

## 2019-01-10 MED ORDER — LIDOCAINE 2% (20 MG/ML) 5 ML SYRINGE
INTRAMUSCULAR | Status: DC | PRN
  Administered 2019-01-10: 80 mg via INTRAVENOUS

## 2019-01-10 MED ORDER — PROPOFOL 10 MG/ML IV BOLUS
INTRAVENOUS | Status: DC | PRN
  Administered 2019-01-10: 50 mg via INTRAVENOUS
  Administered 2019-01-10: 200 mg via INTRAVENOUS

## 2019-01-10 MED ORDER — DEXAMETHASONE SODIUM PHOSPHATE 4 MG/ML IJ SOLN
4.0000 mg | Freq: Four times a day (QID) | INTRAMUSCULAR | Status: AC
  Administered 2019-01-11 – 2019-01-12 (×4): 4 mg via INTRAVENOUS
  Filled 2019-01-10 (×4): qty 1

## 2019-01-10 MED ORDER — SUCCINYLCHOLINE CHLORIDE 200 MG/10ML IV SOSY
PREFILLED_SYRINGE | INTRAVENOUS | Status: AC
  Filled 2019-01-10: qty 10

## 2019-01-10 MED ORDER — PROMETHAZINE HCL 12.5 MG PO TABS
12.5000 mg | ORAL_TABLET | ORAL | Status: DC | PRN
  Filled 2019-01-10: qty 2

## 2019-01-10 MED ORDER — CEFAZOLIN SODIUM-DEXTROSE 2-4 GM/100ML-% IV SOLN
2.0000 g | Freq: Three times a day (TID) | INTRAVENOUS | Status: AC
  Administered 2019-01-10 – 2019-01-11 (×2): 2 g via INTRAVENOUS
  Filled 2019-01-10 (×2): qty 100

## 2019-01-10 MED ORDER — ONDANSETRON HCL 4 MG/2ML IJ SOLN
INTRAMUSCULAR | Status: AC
  Filled 2019-01-10: qty 2

## 2019-01-10 MED ORDER — LABETALOL HCL 5 MG/ML IV SOLN: 10.0000 mg | INTRAVENOUS | Status: DC | PRN

## 2019-01-10 SURGICAL SUPPLY — 75 items
BATTERY IQ STERILE (MISCELLANEOUS) ×1 IMPLANT
BIT DRILL WIRE PASS 1.3MM (BIT) IMPLANT
BLADE CLIPPER SURG (BLADE) ×3 IMPLANT
BUR ACORN 6.0 PRECISION (BURR) ×3 IMPLANT
BUR SPIRAL ROUTER 2.3 (BUR) ×3 IMPLANT
CANISTER SUCT 3000ML PPV (MISCELLANEOUS) ×4 IMPLANT
CARTRIDGE OIL MAESTRO DRILL (MISCELLANEOUS) ×2 IMPLANT
CLIP VESOCCLUDE MED 6/CT (CLIP) ×2 IMPLANT
CONT SPEC 4OZ CLIKSEAL STRL BL (MISCELLANEOUS) ×4 IMPLANT
DIFFUSER DRILL AIR PNEUMATIC (MISCELLANEOUS) ×3 IMPLANT
DRAPE MICROSCOPE LEICA (MISCELLANEOUS) ×1 IMPLANT
DRAPE NEUROLOGICAL W/INCISE (DRAPES) ×3 IMPLANT
DRAPE WARM FLUID 44X44 (DRAPES) ×3 IMPLANT
DRILL WIRE PASS 1.3MM (BIT) ×3
DRSG OPSITE 4X5.5 SM (GAUZE/BANDAGES/DRESSINGS) ×2 IMPLANT
DRSG TELFA 3X8 NADH (GAUZE/BANDAGES/DRESSINGS) ×3 IMPLANT
DURAMATRIX ONLAY 3X3 (Plate) ×2 IMPLANT
DURAPREP 6ML APPLICATOR 50/CS (WOUND CARE) ×4 IMPLANT
ELECT REM PT RETURN 9FT ADLT (ELECTROSURGICAL) ×3
ELECTRODE REM PT RTRN 9FT ADLT (ELECTROSURGICAL) ×2 IMPLANT
EVACUATOR 1/8 PVC DRAIN (DRAIN) IMPLANT
EVACUATOR SILICONE 100CC (DRAIN) IMPLANT
FORCEPS BIPOLAR SPETZLER 8 1.0 (NEUROSURGERY SUPPLIES) ×1 IMPLANT
GAUZE 4X4 16PLY RFD (DISPOSABLE) IMPLANT
GAUZE SPONGE 4X4 12PLY STRL (GAUZE/BANDAGES/DRESSINGS) ×2 IMPLANT
GLOVE BIO SURGEON STRL SZ8 (GLOVE) ×5 IMPLANT
GLOVE BIOGEL PI IND STRL 7.0 (GLOVE) IMPLANT
GLOVE BIOGEL PI IND STRL 7.5 (GLOVE) IMPLANT
GLOVE BIOGEL PI IND STRL 8 (GLOVE) ×2 IMPLANT
GLOVE BIOGEL PI IND STRL 8.5 (GLOVE) ×2 IMPLANT
GLOVE BIOGEL PI INDICATOR 7.0 (GLOVE) ×2
GLOVE BIOGEL PI INDICATOR 7.5 (GLOVE) ×3
GLOVE BIOGEL PI INDICATOR 8 (GLOVE) ×2
GLOVE BIOGEL PI INDICATOR 8.5 (GLOVE) ×2
GLOVE ECLIPSE 7.0 STRL STRAW (GLOVE) ×1 IMPLANT
GLOVE ECLIPSE 8.0 STRL XLNG CF (GLOVE) ×4 IMPLANT
GLOVE SS N UNI LF 7.0 STRL (GLOVE) ×2 IMPLANT
GLOVE SS N UNI LF 7.5 STRL (GLOVE) ×2 IMPLANT
GOWN STRL REUS W/ TWL LRG LVL3 (GOWN DISPOSABLE) IMPLANT
GOWN STRL REUS W/ TWL XL LVL3 (GOWN DISPOSABLE) IMPLANT
GOWN STRL REUS W/TWL 2XL LVL3 (GOWN DISPOSABLE) ×1 IMPLANT
GOWN STRL REUS W/TWL LRG LVL3 (GOWN DISPOSABLE) ×1
GOWN STRL REUS W/TWL XL LVL3 (GOWN DISPOSABLE) ×2
HEMOSTAT POWDER KIT SURGIFOAM (HEMOSTASIS) ×3 IMPLANT
HEMOSTAT SURGICEL 2X14 (HEMOSTASIS) ×3 IMPLANT
KIT BASIN OR (CUSTOM PROCEDURE TRAY) ×3 IMPLANT
KIT TURNOVER KIT B (KITS) ×3 IMPLANT
MARKER SKIN DUAL TIP RULER LAB (MISCELLANEOUS) ×3 IMPLANT
MARKER SPHERE PSV REFLC 13MM (MARKER) ×6 IMPLANT
NDL HYPO 25X1 1.5 SAFETY (NEEDLE) ×2 IMPLANT
NEEDLE HYPO 25X1 1.5 SAFETY (NEEDLE) ×3 IMPLANT
NS IRRIG 1000ML POUR BTL (IV SOLUTION) ×6 IMPLANT
OIL CARTRIDGE MAESTRO DRILL (MISCELLANEOUS) ×3
PACK CRANIOTOMY CUSTOM (CUSTOM PROCEDURE TRAY) ×3 IMPLANT
PAD ARMBOARD 7.5X6 YLW CONV (MISCELLANEOUS) ×5 IMPLANT
PAD DRESSING TELFA 3X8 NADH (GAUZE/BANDAGES/DRESSINGS) IMPLANT
PATTIES SURGICAL .5 X.5 (GAUZE/BANDAGES/DRESSINGS) ×1 IMPLANT
PATTIES SURGICAL .5 X3 (DISPOSABLE) ×1 IMPLANT
PIN MAYFIELD SKULL DISP (PIN) ×3 IMPLANT
PLATE 1.5/0.5 18.5MM BURR HOLE (Plate) ×4 IMPLANT
RUBBERBAND STERILE (MISCELLANEOUS) ×2 IMPLANT
SCREW SELF DRILL HT 1.5/4MM (Screw) ×17 IMPLANT
SPONGE NEURO XRAY DETECT 1X3 (DISPOSABLE) IMPLANT
SPONGE SURGIFOAM ABS GEL 100 (HEMOSTASIS) ×3 IMPLANT
STAPLER SKIN PROX WIDE 3.9 (STAPLE) ×3 IMPLANT
SUT ETHILON 3 0 FSL (SUTURE) IMPLANT
SUT NURALON 4 0 TR CR/8 (SUTURE) ×8 IMPLANT
SUT SILK 2 0 PERMA HAND 18 BK (SUTURE) IMPLANT
SUT VIC AB 2-0 CP2 18 (SUTURE) ×6 IMPLANT
TOWEL GREEN STERILE (TOWEL DISPOSABLE) ×3 IMPLANT
TOWEL GREEN STERILE FF (TOWEL DISPOSABLE) ×3 IMPLANT
TRAY FOLEY MTR SLVR 16FR STAT (SET/KITS/TRAYS/PACK) ×3 IMPLANT
TUBE CONNECTING 12X1/4 (SUCTIONS) ×3 IMPLANT
UNDERPAD 30X30 (UNDERPADS AND DIAPERS) ×3 IMPLANT
WATER STERILE IRR 1000ML POUR (IV SOLUTION) ×3 IMPLANT

## 2019-01-10 NOTE — Progress Notes (Signed)
Patient ID: Joshua [name], male   DOB: 1961-05-21, 58 y.o.   MRN: [id number] Waking up, responds readily to voice, following commands. Reports some headache. PEARL. Good strength BUE, some weakness LLE, ?similar to pre-op.

## 2019-01-10 NOTE — Progress Notes (Signed)
Noted considerable weakness to patient's left lower extremity on arrival to PACU.  Discussed with Dr. Melven Sartorius nurse Aaron Edelman who also examined patient.  He noted patient is weaker than pre-operative baseline on the left lower extremity, recommended to continue to monitor closely.

## 2019-01-10 NOTE — Anesthesia Preprocedure Evaluation (Addendum)
Anesthesia Evaluation  Patient identified by MRN, date of birth, ID band Patient awake    Reviewed: Allergy & Precautions, NPO status , Patient's Chart, lab work & pertinent test results  History of Anesthesia Complications Negative for: history of anesthetic complications  Airway Mallampati: II  TM Distance: >3 FB Neck ROM: Full    Dental  (+) Dental Advisory Given, Partial Upper,    Pulmonary Current Smoker,    breath sounds clear to auscultation       Cardiovascular negative cardio ROS   Rhythm:Regular Rate:Bradycardia     Neuro/Psych  Headaches,  Meningioma Left lower extremity weakness  negative psych ROS   GI/Hepatic negative GI ROS, Neg liver ROS,   Endo/Other  negative endocrine ROS  Renal/GU negative Renal ROS     Musculoskeletal  Diffuse idiopathic skeletal hyperostosis    Abdominal   Peds  Hematology negative hematology ROS (+)   Anesthesia Other Findings   Reproductive/Obstetrics                           Anesthesia Physical Anesthesia Plan  ASA: III  Anesthesia Plan: General   Post-op Pain Management:    Induction: Intravenous  PONV Risk Score and Plan: 2 and Treatment may vary due to age or medical condition, Ondansetron, Dexamethasone and Midazolam  Airway Management Planned: Oral ETT  Additional Equipment:   Intra-op Plan:   Post-operative Plan: Extubation in OR  Informed Consent: I have reviewed the patients History and Physical, chart, labs and discussed the procedure including the risks, benefits and alternatives for the proposed anesthesia with the patient or authorized representative who has indicated his/her understanding and acceptance.     Dental advisory given  Plan Discussed with: CRNA and Anesthesiologist  Anesthesia Plan Comments: ( 2 peripheral IVs)      Anesthesia Quick Evaluation

## 2019-01-10 NOTE — Plan of Care (Signed)
  Problem: Education: Goal: Expressions of having a comfortable level of knowledge regarding the disease process will increase Outcome: Progressing   Problem: Coping: Goal: Ability to adjust to condition or change in health will improve Outcome: Progressing Goal: Ability to identify appropriate support needs will improve Outcome: Progressing   Problem: Health Behavior/Discharge Planning: Goal: Compliance with prescribed medication regimen will improve Outcome: Progressing   Problem: Medication: Goal: Risk for medication side effects will decrease Outcome: Progressing   Problem: Safety: Goal: Verbalization of understanding the information provided will improve Outcome: Progressing

## 2019-01-10 NOTE — Brief Op Note (Signed)
01/10/2019  3:21 PM  PATIENT:  Joshua [name]  58 y.o. male  PRE-OPERATIVE DIAGNOSIS: Parafalcine Meningioma, Recurrent  POST-OPERATIVE DIAGNOSIS: Parafalcine Meningioma, Recurrent  PROCEDURE:  Procedure(s) with comments: Resection of parasagittal meningioma with bihemispheric  craniotomy for tumor  -parasagittal craniotomy for tumor APPLICATION OF CRANIAL NAVIGATION (N/A)  SURGEON:  Surgeon(s) and Role:    Erline Levine, MD - Primary    * Consuella Lose, MD - Assisting  PHYSICIAN ASSISTANT:   ASSISTANTS: Poteat, RN   ANESTHESIA:   general  EBL:  50 mL   BLOOD ADMINISTERED:none  DRAINS: none   LOCAL MEDICATIONS USED:  MARCAINE    and LIDOCAINE   SPECIMEN:  Excision  DISPOSITION OF SPECIMEN:  PATHOLOGY  COUNTS:  YES  TOURNIQUET:  * No tourniquets in log *  DICTATION: DICTATION: Patient is 58 year old man with 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  It was elected to take him to surgery for craniotomy for right parasagittal brain tumor.  He had a Brainlab MRI and CT for surgical localization of tumor.  Procedure:  Following smooth intubation, patient was placed in supine position with brow up.  Head was placed in pins and parasagittal scalp was prepped and draped in usual sterile fashion after Brainlab CTwas localized to map tumor location along with location of sagittal sinus.  Area of planned incision was infiltrated with lidocaine. A linear incision was made and carried through temporalis fascia and muscle to expose calvarium and cross midline.  The previous skull flap was exposed.  The bone had regrown.  We elected to create a larger skull flap and to cross the midline.  Skull flap was elevated exposing the dura directly overlying the brain mass after clearing bone across sagittal sinus with Kerrison and dissecting dura free from bone flap. Dura was opened and flapped medially based on sagittal sinus exposing  the meningioma.  The arachnoid was opened around the meningioma, which was gutted and gradually removed in its entirety.  The sagittal sinus had been occluded.  We crossed the sinus and resected both sides along with the majority of the falx, which was thickened and full of tumor.  We removed tumor to its most posterior extent, came across the sinus here as well.  We elected to leave a small plug of tumor in the sinus, which was occluded.  The remaining tumor was peeled off the sagittal sinus and falx.  Hemostasis was assured with irrigation.  The dura was reconstructed, along with the falx, with Dural matrix.  The bone flap was replaced with plates after burring off any tumor on the bone flap.  , the fascia and galea were closed with 2-0 vicryl sutures and the skin was re approximated with staples.  A sterile occlusive dressing was placed.  Patient was returned to a supine position and taken out of head pins, then extubated in the operating room, having tolerated surgery well.  Counts were correct at the end of the case.   PLAN OF CARE: Admit to inpatient   PATIENT DISPOSITION:  PACU - hemodynamically stable.   Delay start of Pharmacological VTE agent (>24hrs) due to surgical blood loss or risk of bleeding: yes

## 2019-01-10 NOTE — Transfer of Care (Signed)
Immediate Anesthesia Transfer of Care Note  Patient: Joshua [name]  Procedure(s) Performed: Right Parasagittal Craniotomy for Tumor (Right Head) APPLICATION OF CRANIAL NAVIGATION (N/A )  Patient Location: PACU  Anesthesia Type:General  Level of Consciousness: awake, alert , oriented and patient cooperative  Airway & Oxygen Therapy: Patient Spontanous Breathing  Post-op Assessment: Report given to RN and Post -op Vital signs reviewed and stable  Post vital signs: Reviewed and stable  Last Vitals:  Vitals Value Taken Time  BP 148/90 01/10/19 1536  Temp 37.1 C 01/10/19 1536  Pulse 84 01/10/19 1539  Resp 19 01/10/19 1539  SpO2 97 % 01/10/19 1539  Vitals shown include unvalidated device data.  Last Pain:  Vitals:   01/10/19 0750  TempSrc: Oral  PainSc:       Patients Stated Pain Goal: 2 (52/08/02 2336)  Complications: No apparent anesthesia complications

## 2019-01-10 NOTE — Progress Notes (Signed)
Subjective: Patient reports doing OK.  Objective: Vital signs in last 24 hours: Temp:  [98 F (36.7 C)-98.4 F (36.9 C)] 98 F (36.7 C) (06/25 0351) Pulse Rate:  [59-71] 62 (06/25 0351) Resp:  [18] 18 (06/24 2329) BP: (108-124)/(68-85) 111/68 (06/25 0351) SpO2:  [93 %-98 %] 98 % (06/25 0351)  Intake/Output from previous day: No intake/output data recorded. Intake/Output this shift: No intake/output data recorded.  Physical Exam:   Lab Results: Recent Labs    01/09/19 1032  WBC 12.3*  HGB 16.6  HCT 49.5  PLT 321   BMET Recent Labs    01/09/19 1032  NA 140  K 4.5  CL 102  CO2 29  GLUCOSE 133*  BUN 15  CREATININE 0.79  CALCIUM 9.7    Studies/Results: Ct Head Wo Contrast  Result Date: 01/09/2019 CLINICAL DATA:  Meningioma monitoring EXAM: CT HEAD WITHOUT CONTRAST TECHNIQUE: Contiguous axial images were obtained from the base of the skull through the vertex without intravenous contrast. 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 The appearance of the brain is otherwise normal. Vascular: Persistent invasion of the superior sagittal sinus. Skull: The visualized skull base, calvarium and extracranial soft tissues are normal. Sinuses/Orbits: No fluid levels or advanced mucosal thickening of the visualized paranasal sinuses. No mastoid or middle ear effusion. The orbits are normal. IMPRESSION: Unchanged appearance of large parafalcine meningioma with invasion of the superior sagittal sinus. Electronically Signed   By: Ulyses Jarred M.D.   On: 01/09/2019 22:07   Mr Jeri Cos VO Contrast  Result Date: 01/08/2019 CLINICAL DATA:  Meningioma preoperative planning EXAM: MRI HEAD WITHOUT AND WITH CONTRAST TECHNIQUE: Multiplanar, multiecho pulse sequences of the brain and surrounding structures were obtained without and with intravenous contrast. CONTRAST:  10 mL Gadovist IV COMPARISON:  MRI head 12/27/2018 FINDINGS:  Brain: Postop surgical resection of convexity meningioma. Large recurrent meningioma over the convexity predominantly on the right side but extending to the left of midline. The mass invades the superior sagittal sinus which appears occluded. There is central tumor necrosis. The mass currently measures approximately 7.6 x 4.0 X 2.7 cm unchanged from the recent study. There is mass-effect on the right frontal parietal lobe over the convexity with mild vasogenic edema in the right frontal parietal lobe. Diffusion tensor imaging for white matter tract demonstration. Ventricle size is normal. No midline shift. No acute infarct or hemorrhage. Mild chronic white matter changes. Vascular: Normal arterial flow voids Tumor invades the superior sagittal sinus in appears to occlude flow. Skull and upper cervical spine: Right convexity craniotomy. Sinuses/Orbits: Negative Other: None IMPRESSION: Large recurrent convexity meningioma is unchanged from the recent study. There is mild edema in the right frontal parietal white matter. The tumor fills the superior sagittal sinus which appears occluded. Electronically Signed   By: Franchot Gallo M.D.   On: 01/08/2019 13:52    Assessment/Plan: Patient is ready for surgery.  No seizures last night.  He is aware of the risks of weakness and continued problems with seizures after surgery and also understands he is likely to need post-resection radiation treatments and close follow up after surgery to monitor for tumor regrowth.  He understands these risks and wishes to proceed.    LOS: 7 days    Peggyann Shoals, MD 01/10/2019, 7:48 AM

## 2019-01-10 NOTE — Anesthesia Postprocedure Evaluation (Signed)
Anesthesia Post Note  Patient: Joshua [name]  Procedure(s) Performed: Right Parasagittal Craniotomy for Tumor (Right Head) APPLICATION OF CRANIAL NAVIGATION (N/A )     Patient location during evaluation: PACU Anesthesia Type: General Level of consciousness: awake and alert Pain management: pain level controlled Vital Signs Assessment: post-procedure vital signs reviewed and stable Respiratory status: spontaneous breathing, nonlabored ventilation, respiratory function stable and patient connected to nasal cannula oxygen Cardiovascular status: blood pressure returned to baseline and stable Postop Assessment: no apparent nausea or vomiting Anesthetic complications: no    Last Vitals:  Vitals:   01/10/19 1621 01/10/19 1640  BP: 132/85 117/75  Pulse: 78   Resp: 16 16  Temp: 37.1 C 36.7 C  SpO2: 95% 95%    Last Pain:  Vitals:   01/10/19 1706  TempSrc:   PainSc: 10-Worst pain ever                 Larren Copes S

## 2019-01-10 NOTE — Interval H&P Note (Signed)
History and Physical Interval Note:  01/10/2019 7:29 AM  Joshua [name]  has presented today for surgery, with the diagnosis of Meningioma, Recurrent.  The various methods of treatment have been discussed with the patient and family. After consideration of risks, benefits and other options for treatment, the patient has consented to  Procedure(s) with comments: Right parasagittal craniotomy for tumor (Right) - Right parasagittal craniotomy for tumor [place] (N/A) as a surgical intervention.  The patient's history has been reviewed, patient examined, no change in status, stable for surgery.  I have reviewed the patient's chart and labs.  Questions were answered to the patient's satisfaction.     Peggyann Shoals

## 2019-01-10 NOTE — Anesthesia Procedure Notes (Signed)
Procedure Name: Intubation Date/Time: 01/10/2019 12:44 PM Performed by: Trinna Post., CRNA Pre-anesthesia Checklist: Patient identified, Emergency Drugs available, Suction available, Patient being monitored and Timeout performed Patient Re-evaluated:Patient Re-evaluated prior to induction Oxygen Delivery Method: Circle system utilized Preoxygenation: Pre-oxygenation with 100% oxygen Induction Type: IV induction, Rapid sequence and Cricoid Pressure applied Laryngoscope Size: Mac and 4 Grade View: Grade I Tube type: Oral Tube size: 7.5 mm Number of attempts: 1 Airway Equipment and Method: Stylet Placement Confirmation: ETT inserted through vocal cords under direct vision,  positive ETCO2 and breath sounds checked- equal and bilateral Secured at: 23 cm Tube secured with: Tape Dental Injury: Teeth and Oropharynx as per pre-operative assessment

## 2019-01-10 NOTE — Anesthesia Procedure Notes (Signed)
Arterial Line Insertion Start/End6/25/2020 11:03 AM, 01/10/2019 11:03 AM Performed by: Renato Shin, CRNA, CRNA  Patient location: Pre-op. Preanesthetic checklist: patient identified, IV checked, site marked, risks and benefits discussed, surgical consent, monitors and equipment checked, pre-op evaluation, timeout performed and anesthesia consent Lidocaine 1% used for infiltration Left, radial was placed Catheter size: 20 G Hand hygiene performed , maximum sterile barriers used  and Seldinger technique used Allen's test indicative of satisfactory collateral circulation Attempts: 1 Procedure performed without using ultrasound guided technique. Following insertion, dressing applied and Biopatch. Post procedure assessment: normal

## 2019-01-10 NOTE — Op Note (Signed)
01/10/2019  3:21 PM  PATIENT:  Joshua [name]  58 y.o. male  PRE-OPERATIVE DIAGNOSIS: Parafalcine Meningioma, Recurrent  POST-OPERATIVE DIAGNOSIS: Parafalcine Meningioma, Recurrent  PROCEDURE:  Procedure(s) with comments: Resection of parasagittal meningioma with bihemispheric  craniotomy for tumor  -parasagittal craniotomy for tumor APPLICATION OF CRANIAL NAVIGATION (N/A)  SURGEON:  Surgeon(s) and Role:    Erline Levine, MD - Primary    * Consuella Lose, MD - Assisting  PHYSICIAN ASSISTANT:   ASSISTANTS: Poteat, RN   ANESTHESIA:   general  EBL:  50 mL   BLOOD ADMINISTERED:none  DRAINS: none   LOCAL MEDICATIONS USED:  MARCAINE    and LIDOCAINE   SPECIMEN:  Excision  DISPOSITION OF SPECIMEN:  PATHOLOGY  COUNTS:  YES  TOURNIQUET:  * No tourniquets in log *  DICTATION: DICTATION: Patient is 58 year old man with 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  It was elected to take him to surgery for craniotomy for right parasagittal brain tumor.  He had a Brainlab MRI and CT for surgical localization of tumor.  Procedure:  Following smooth intubation, patient was placed in supine position with brow up.  Head was placed in pins and parasagittal scalp was prepped and draped in usual sterile fashion after Brainlab CTwas localized to map tumor location along with location of sagittal sinus.  Area of planned incision was infiltrated with lidocaine. A linear incision was made and carried through temporalis fascia and muscle to expose calvarium and cross midline.  The previous skull flap was exposed.  The bone had regrown.  We elected to create a larger skull flap and to cross the midline.  Skull flap was elevated exposing the dura directly overlying the brain mass after clearing bone across sagittal sinus with Kerrison and dissecting dura free from bone flap. Dura was opened and flapped medially based on sagittal sinus exposing  the meningioma.  The arachnoid was opened around the meningioma, which was gutted and gradually removed in its entirety.  The sagittal sinus had been occluded.  We crossed the sinus and resected both sides along with the majority of the falx, which was thickened and full of tumor.  We removed tumor to its most posterior extent, came across the sinus here as well.  We elected to leave a small plug of tumor in the sinus, which was occluded.  The remaining tumor was peeled off the sagittal sinus and falx.  Hemostasis was assured with irrigation.  The dura was reconstructed, along with the falx, with Dural matrix.  The bone flap was replaced with plates after burring off any tumor on the bone flap.  , the fascia and galea were closed with 2-0 vicryl sutures and the skin was re approximated with staples.  A sterile occlusive dressing was placed.  Patient was returned to a supine position and taken out of head pins, then extubated in the operating room, having tolerated surgery well.  Counts were correct at the end of the case.   PLAN OF CARE: Admit to inpatient   PATIENT DISPOSITION:  PACU - hemodynamically stable.   Delay start of Pharmacological VTE agent (>24hrs) due to surgical blood loss or risk of bleeding: yes

## 2019-01-11 ENCOUNTER — Encounter (HOSPITAL_COMMUNITY): Payer: Self-pay | Admitting: Neurosurgery

## 2019-01-11 ENCOUNTER — Inpatient Hospital Stay (HOSPITAL_COMMUNITY): Payer: Medicaid Other

## 2019-01-11 LAB — POCT I-STAT 7, (LYTES, BLD GAS, ICA,H+H)
Bicarbonate: 25.3 mmol/L (ref 20.0–28.0)
Calcium, Ion: 1.21 mmol/L (ref 1.15–1.40)
HCT: 38 % — ABNORMAL LOW (ref 39.0–52.0)
Hemoglobin: 12.9 g/dL — ABNORMAL LOW (ref 13.0–17.0)
O2 Saturation: 100 %
Patient temperature: 98.6
Potassium: 3.6 mmol/L (ref 3.5–5.1)
Sodium: 139 mmol/L (ref 135–145)
TCO2: 27 mmol/L (ref 22–32)
pCO2 arterial: 44.2 mmHg (ref 32.0–48.0)
pH, Arterial: 7.366 (ref 7.350–7.450)
pO2, Arterial: 239 mmHg — ABNORMAL HIGH (ref 83.0–108.0)

## 2019-01-11 MED ORDER — PANTOPRAZOLE SODIUM 40 MG PO TBEC
40.0000 mg | DELAYED_RELEASE_TABLET | Freq: Every day | ORAL | Status: DC
  Administered 2019-01-11 – 2019-01-13 (×3): 40 mg via ORAL
  Filled 2019-01-11 (×3): qty 1

## 2019-01-11 MED ORDER — GADOBUTROL 1 MMOL/ML IV SOLN
9.0000 mL | Freq: Once | INTRAVENOUS | Status: AC | PRN
  Administered 2019-01-11: 9 mL via INTRAVENOUS

## 2019-01-11 MED ORDER — CHLORHEXIDINE GLUCONATE CLOTH 2 % EX PADS
6.0000 | MEDICATED_PAD | Freq: Every day | CUTANEOUS | Status: DC
  Administered 2019-01-11: 6 via TOPICAL

## 2019-01-11 NOTE — Progress Notes (Signed)
Educated pt on importance of MRI scan- still refuses to go due to being in "too much pain." Pt stated he might be able to go in the morning but not tonight. Will continue to monitor.

## 2019-01-11 NOTE — Progress Notes (Signed)
PT Cancellation Note  Patient Details Name: Joshua [name] MRN: [id number] DOB: 1960/09/09   Cancelled Treatment:    Reason Eval/Treat Not Completed: Other (comment). Pt declining therapy evaluation, despite encouragement. Stating he has a headache and is tired from MRI (2 hours prior), "not today."  Ellamae Sia, PT, DPT [facility] Pager [phone] Office [phone]    Willy Eddy 01/11/2019, 2:21 PM

## 2019-01-11 NOTE — Progress Notes (Signed)
  NEUROSURGERY PROGRESS NOTE   Pt seen and examined. No issues overnight. Has HA, no other c/o. Has mild left foot weakness but thinks its the same as preop.  EXAM: Temp:  [98.1 F (36.7 C)-98.7 F (37.1 C)] 98.3 F (36.8 C) (06/26 0819) Pulse Rate:  [61-86] 66 (06/26 0900) Resp:  [11-19] 17 (06/26 0900) BP: (112-148)/(69-97) 129/86 (06/26 0900) SpO2:  [90 %-98 %] 96 % (06/26 0900) Arterial Line BP: (103-166)/(59-94) 120/59 (06/26 0600) Weight:  [85.3 kg] 85.3 kg (06/25 1058) Intake/Output      06/25 0701 - 06/26 0700 06/26 0701 - 06/27 0700   P.O. 120    I.V. (mL/kg) 2463.4 (28.9) 75 (0.9)   IV Piggyback 100    Total Intake(mL/kg) 2683.4 (31.5) 75 (0.9)   Urine (mL/kg/hr) 1950 (1)    Blood 50    Total Output 2000    Net +683.4 +75        Urine Occurrence 1 x 1 x    Awake, alert, oriented Speech fluent CN intact Good strength BUE, RLE Mild distal LLE weakness ?baseline  LABS: Lab Results  Component Value Date   CREATININE 0.79 01/09/2019   BUN 15 01/09/2019   NA 140 01/09/2019   K 4.5 01/09/2019   CL 102 01/09/2019   CO2 29 01/09/2019   Lab Results  Component Value Date   WBC 12.3 (H) 01/09/2019   HGB 16.6 01/09/2019   HCT 49.5 01/09/2019   MCV 95.0 01/09/2019   PLT 321 01/09/2019   IMPRESSION: - 58 y.o. male POD#1 s/p resection of recurrent parasagittal meningioma. Appears to be essentially at baseline with perhaps mild left foot weakness.  PLAN: - OOB with PT/OT today - d/c condom cath - MRI today - Cont Keppra, Dex

## 2019-01-12 MED ORDER — ASPIRIN EC 81 MG PO TBEC: 81.0000 mg | DELAYED_RELEASE_TABLET | Freq: Every day | ORAL | Status: DC

## 2019-01-12 MED ORDER — ASPIRIN EC 81 MG PO TBEC
81.0000 mg | DELAYED_RELEASE_TABLET | Freq: Every day | ORAL | Status: DC
  Administered 2019-01-12 – 2019-01-14 (×3): 81 mg via ORAL
  Filled 2019-01-12 (×3): qty 1

## 2019-01-12 NOTE — Progress Notes (Signed)
Neurosurgery Service Progress Note  Subjective: No acute events overnight, headache over the incisional area today, reports leg weakness is improving, no new complaints   Objective: Vitals:   01/11/19 2000 01/11/19 2150 01/11/19 2345 01/12/19 0328  BP:  127/79 119/81 106/75  Pulse:  60 64 (!) 59  Resp:  19 17 17   Temp: 99.1 F (37.3 C) 98.2 F (36.8 C) 99.6 F (37.6 C) 98.5 F (36.9 C)  TempSrc: Oral Oral Oral Oral  SpO2:  98% 96% 96%  Weight:       Temp (24hrs), Avg:98.7 F (37.1 C), Min:98.2 F (36.8 C), Max:99.6 F (37.6 C)  CBC Latest Ref Rng & Units 01/11/2019 01/09/2019 01/03/2019  WBC 4.0 - 10.5 K/uL - 12.3(H) 9.7  Hemoglobin 13.0 - 17.0 g/dL 12.9(L) 16.6 16.6  Hematocrit 39.0 - 52.0 % 38.0(L) 49.5 49.6  Platelets 150 - 400 K/uL - 321 324   BMP Latest Ref Rng & Units 01/11/2019 01/09/2019 01/03/2019  Glucose 70 - 99 mg/dL - 133(H) 110(H)  BUN 6 - 20 mg/dL - 15 15  Creatinine 0.61 - 1.24 mg/dL - 0.79 0.82  Sodium 135 - 145 mmol/L 139 140 141  Potassium 3.5 - 5.1 mmol/L 3.6 4.5 3.8  Chloride 98 - 111 mmol/L - 102 106  CO2 22 - 32 mmol/L - 29 26  Calcium 8.9 - 10.3 mg/dL - 9.7 9.4    Intake/Output Summary (Last 24 hours) at 01/12/2019 0805 Last data filed at 01/12/2019 0345 Gross per 24 hour  Intake 173.62 ml  Output 750 ml  Net -576.38 ml    Current Facility-Administered Medications:  .  0.9 %  sodium chloride infusion, 250 mL, Intravenous, PRN, Erline Levine, MD, Stopped at 01/10/19 1515 .  0.9 %  sodium chloride infusion, , Intravenous, Continuous, Erline Levine, MD .  acetaminophen (TYLENOL) tablet 650 mg, 650 mg, Oral, Q6H PRN **OR** acetaminophen (TYLENOL) suppository 650 mg, 650 mg, Rectal, Q6H PRN, Erline Levine, MD .  acetaminophen (TYLENOL) tablet 650 mg, 650 mg, Oral, Q4H PRN **OR** acetaminophen (TYLENOL) suppository 650 mg, 650 mg, Rectal, Q4H PRN, Erline Levine, MD .  ALPRAZolam Duanne Moron) tablet 0.25 mg, 0.25 mg, Oral, TID, Erline Levine, MD, 0.25 mg at  01/11/19 2140 .  alum & mag hydroxide-simeth (MAALOX/MYLANTA) 200-200-20 MG/5ML suspension 30 mL, 30 mL, Oral, Q6H PRN, Erline Levine, MD, 30 mL at 01/09/19 2042 .  aspirin EC tablet 81 mg, 81 mg, Oral, Daily, Mia Milan, Joyice Faster, MD .  bisacodyl (DULCOLAX) EC tablet 5 mg, 5 mg, Oral, Daily PRN, Erline Levine, MD .  bisacodyl (DULCOLAX) suppository 10 mg, 10 mg, Rectal, Daily PRN, Erline Levine, MD .  Margrett Rud dexamethasone (DECADRON) injection 6 mg, 6 mg, Intravenous, Q6H, 6 mg at 01/11/19 1216 **FOLLOWED BY** dexamethasone (DECADRON) injection 4 mg, 4 mg, Intravenous, Q6H, 4 mg at 01/12/19 0519 **FOLLOWED BY** dexamethasone (DECADRON) injection 4 mg, 4 mg, Intravenous, Q8H, Erline Levine, MD .  docusate sodium (COLACE) capsule 100 mg, 100 mg, Oral, BID, Erline Levine, MD, 100 mg at 01/11/19 2140 .  HYDROcodone-acetaminophen (NORCO/VICODIN) 5-325 MG per tablet 1-2 tablet, 1-2 tablet, Oral, Q4H PRN, Erline Levine, MD, 2 tablet at 01/11/19 2140 .  labetalol (NORMODYNE) injection 10-40 mg, 10-40 mg, Intravenous, Q10 min PRN, Erline Levine, MD .  levETIRAcetam (KEPPRA) tablet 1,500 mg, 1,500 mg, Oral, BID, Erline Levine, MD, 1,500 mg at 01/11/19 2140 .  LORazepam (ATIVAN) tablet 0.5-1 mg, 0.5-1 mg, Oral, Q4H PRN, 1 mg at 01/09/19 1851 **OR** LORazepam (ATIVAN) injection  0.5-2 mg, 0.5-2 mg, Intramuscular, Q4H PRN, Erline Levine, MD, 2 mg at 01/04/19 1541 .  LORazepam (ATIVAN) injection 2 mg, 2 mg, Intravenous, Q4H PRN, Erline Levine, MD, 2 mg at 01/08/19 0930 .  morphine 2 MG/ML injection 1-2 mg, 1-2 mg, Intravenous, Q2H PRN, Erline Levine, MD, 2 mg at 01/11/19 1453 .  ondansetron (ZOFRAN) tablet 4 mg, 4 mg, Oral, Q4H PRN **OR** ondansetron (ZOFRAN) injection 4 mg, 4 mg, Intravenous, Q4H PRN, Erline Levine, MD, 4 mg at 01/11/19 2334 .  oxyCODONE-acetaminophen (PERCOCET/ROXICET) 5-325 MG per tablet 1-2 tablet, 1-2 tablet, Oral, Q4H PRN, Erline Levine, MD, 2 tablet at 01/11/19 0940 .  pantoprazole  (PROTONIX) EC tablet 40 mg, 40 mg, Oral, QHS, Erline Levine, MD, 40 mg at 01/11/19 2140 .  phenytoin (DILANTIN) ER capsule 300 mg, 300 mg, Oral, QHS, Erline Levine, MD, 300 mg at 01/11/19 2141 .  polyethylene glycol (MIRALAX / GLYCOLAX) packet 17 g, 17 g, Oral, Daily PRN, Erline Levine, MD .  promethazine (PHENERGAN) tablet 12.5-25 mg, 12.5-25 mg, Oral, Q4H PRN, Erline Levine, MD .  senna-docusate (Senokot-S) tablet 1 tablet, 1 tablet, Oral, QHS PRN, Erline Levine, MD .  sodium chloride flush (NS) 0.9 % injection 3 mL, 3 mL, Intravenous, Q12H, Erline Levine, MD, 3 mL at 01/11/19 2151 .  sodium chloride flush (NS) 0.9 % injection 3 mL, 3 mL, Intravenous, PRN, Erline Levine, MD .  sodium phosphate (FLEET) 7-19 GM/118ML enema 1 enema, 1 enema, Rectal, Once PRN, Erline Levine, MD .  zolpidem [facility]) tablet 5 mg, 5 mg, Oral, QHS PRN,MR X 1, Erline Levine, MD, 5 mg at 01/10/19 2207   Physical Exam: AOx3, PERRL, EOMI, FS, Strength 5/5 except LLE diffusely 4+/5, SILTx4, no drift  Assessment & Plan: 58 y.o. man s/p resection of recurrent meningioma, recovering well.  -MRI shows expected STR, new filling defect distal to the SSS resection that enters the proximal R TS. Asymptomatic and only POD2, will Tx with ASA81 to hopefully prevent clot propogation -continue PT/OT -SCDs/TEDs, start [facility] tomorrow if pt is not discharged in AM  [place]  01/12/19 8:05 AM

## 2019-01-12 NOTE — Evaluation (Signed)
Physical Therapy Evaluation Patient Details Name: Joshua [name] MRN: [id number] DOB: 24-Mar-1961 Today's Date: 01/12/2019   History of Present Illness  58 yo male s/p Resection of parasagittal meningioma with bihemispheric  craniotomy for tumor  -parasagittal craniotomy for tumor 6/26. PMH includes: brain tumor removal 2014,  R Knee Arthoscopy.     Clinical Impression  Pt admitted with above diagnosis. Pt currently with functional limitations due to the deficits listed below (see PT Problem List). PTA pt living at home with wife, reports independence with mobility without AD. Today, patient ambulating unit with RW, LLE weakness noted. rec HHPT for balance and safe progression of activity.  Pt will benefit from skilled PT to increase their independence and safety with mobility to allow discharge to the venue listed below.       Follow Up Recommendations Home health PT;Supervision for mobility/OOB    Equipment Recommendations  None recommended by PT    Recommendations for Other Services       Precautions / Restrictions Precautions Precautions: Fall Restrictions Weight Bearing Restrictions: No      Mobility  Bed Mobility Overal bed mobility: Modified Independent                Transfers Overall transfer level: Modified independent Equipment used: Rolling walker (2 wheeled)                Ambulation/Gait Ambulation/Gait assistance: Supervision Gait Distance (Feet): 200 Feet Assistive device: Rolling walker (2 wheeled) Gait Pattern/deviations: Step-to pattern Gait velocity: decreased   General Gait Details: use of RW for safety due to LLE weakness. no overt LOB  Stairs            Wheelchair Mobility    Modified Rankin (Stroke Patients Only)       Balance Overall balance assessment: Needs assistance   Sitting balance-Leahy Scale: Fair       Standing balance-Leahy Scale: Poor                               Pertinent  Vitals/Pain Pain Assessment: No/denies pain    Home Living Family/patient expects to be discharged to:: Private residence Living Arrangements: Spouse/significant other Available Help at Discharge: Family;Available 24 hours/day Type of Home: Independent living facility Home Access: Level entry     Home Layout: One level Home Equipment: Walker - 2 wheels      Prior Function Level of Independence: Independent         Comments: reports no use of AD     Hand Dominance        Extremity/Trunk Assessment   Upper Extremity Assessment Upper Extremity Assessment: Defer to OT evaluation    Lower Extremity Assessment Lower Extremity Assessment: LLE deficits/detail LLE Deficits / Details: LLE 3+/5 strength, weak with shaking with contraction        Communication   Communication: No difficulties  Cognition Arousal/Alertness: Awake/alert Behavior During Therapy: WFL for tasks assessed/performed Overall Cognitive Status: Within Functional Limits for tasks assessed                                        General Comments      Exercises     Assessment/Plan    PT Assessment Patient needs continued PT services  PT Problem List Decreased strength       PT Treatment Interventions DME instruction;Gait  training;Stair training;Functional mobility training;Therapeutic activities;Therapeutic exercise    PT Goals (Current goals can be found in the Care Plan section)  Acute Rehab PT Goals Patient Stated Goal: go home PT Goal Formulation: With patient Potential to Achieve Goals: Good    Frequency Min 3X/week   Barriers to discharge        Co-evaluation               AM-PAC PT "6 Clicks" Mobility  Outcome Measure Help needed turning from your back to your side while in a flat bed without using bedrails?: None Help needed moving from lying on your back to sitting on the side of a flat bed without using bedrails?: None Help needed moving to and from  a bed to a chair (including a wheelchair)?: A Little Help needed standing up from a chair using your arms (e.g., wheelchair or bedside chair)?: A Little Help needed to walk in hospital room?: A Little Help needed climbing 3-5 steps with a railing? : A Little 6 Click Score: 20    End of Session Equipment Utilized During Treatment: Gait belt Activity Tolerance: Patient tolerated treatment well Patient left: in bed Nurse Communication: Mobility status PT Visit Diagnosis: Unsteadiness on feet (R26.81)    Time: 0865-7846 PT Time Calculation (min) (ACUTE ONLY): 25 min   Charges:   PT Evaluation $PT Eval Moderate Complexity: 1 Mod PT Treatments $Gait Training: 8-22 mins        Reinaldo Berber, PT, DPT Acute Rehabilitation Services Pager: [phone] Office: [place] 01/12/2019, 11:12 AM

## 2019-01-12 NOTE — Evaluation (Signed)
Occupational Therapy Evaluation Patient Details Name: Joshua [name] MRN: [id number] DOB: Feb 25, 1961 Today's Date: 01/12/2019    History of Present Illness 58 yo male admitted 01/03/19 with new onset Lt sided jerks and twitches.   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  He underwent resection of meningioma on 01/10/19.  PMH Includes:  h/o meningioma with rescection, HA,    Clinical Impression   Pt admitted with above. He demonstrates the below listed deficits and will benefit from continued OT to maximize safety and independence with BADLs.  Pt presents to OT with weakness and incoordination of Lt LE, impaired balance, decreased activity tolerance, as well as cognitive deficits.  The short blessed test was administered with pt scoring 14/28 which is indicative of significant cognitive impairment.  He was very aware of his deficits and acknowledged immediately, that he should have performed better, and this was not his baseline.  He was unable to recall info after ~3 mins, and was unable to name months of the year backwards.   He reports he lives with his wife and was independent PTA, including driving.  He reports wife will be able to provide 24 hour supervision, and can monitor him with medication management, finances.   Recommend no driving.        Follow Up Recommendations  No OT follow up;Supervision/Assistance - 24 hour    Equipment Recommendations  None recommended by OT    Recommendations for Other Services       Precautions / Restrictions Precautions Precautions: Fall Restrictions Weight Bearing Restrictions: No      Mobility Bed Mobility Overal bed mobility: Modified Independent                Transfers Overall transfer level: Modified independent Equipment used: Rolling walker (2 wheeled)                  Balance Overall balance assessment: Needs assistance   Sitting balance-Leahy Scale: Fair       Standing  balance-Leahy Scale: Poor Standing balance comment: reliant on UE support                            ADL either performed or assessed with clinical judgement   ADL Overall ADL's : Needs assistance/impaired Eating/Feeding: Independent   Grooming: Wash/dry hands;Wash/dry face;Oral care;Brushing hair;Supervision/safety;Standing   Upper Body Bathing: Set up;Supervision/ safety;Sitting;Standing   Lower Body Bathing: Supervison/ safety;Sit to/from stand   Upper Body Dressing : Supervision/safety;Set up;Sitting   Lower Body Dressing: Supervision/safety;Sit to/from stand;Bed level   Toilet Transfer: Set up;Ambulation;Comfort height toilet;RW   Toileting- Clothing Manipulation and Hygiene: Supervision/safety;Sit to/from stand       Functional mobility during ADLs: Supervision/safety;Rolling walker General ADL Comments: Rt LE tremulous with attempts at LB ADLs.        Vision Baseline Vision/History: Wears glasses Patient Visual Report: No change from baseline Vision Assessment?: Yes Eye Alignment: Within Functional Limits Alignment/Gaze Preference: Within Defined Limits Visual Fields: No apparent deficits     Perception Perception Perception Tested?: Yes   Praxis Praxis Praxis tested?: Within functional limits    Pertinent Vitals/Pain Pain Assessment: 0-10 Pain Score: 6  Pain Location: head Pain Descriptors / Indicators: Operative site guarding Pain Intervention(s): Monitored during session;Repositioned;Patient requesting pain meds-RN notified     Hand Dominance Right   Extremity/Trunk Assessment Upper Extremity Assessment Upper Extremity Assessment: Overall WFL for tasks assessed   Lower Extremity Assessment  Lower Extremity Assessment: Defer to PT evaluation       Communication Communication Communication: No difficulties   Cognition Arousal/Alertness: Awake/alert Behavior During Therapy: WFL for tasks assessed/performed Overall Cognitive Status:  Impaired/Different from baseline Area of Impairment: Attention;Memory;Problem solving                   Current Attention Level: Selective Memory: Decreased short-term memory       Problem Solving: Slow processing;Difficulty sequencing;Requires verbal cues General Comments: administered the Short Blessed Test.  He scored 14/28 which is inidicative of  significant cognitive dysfunction.  He was unable to recall the name/address provided, and was unable to say the months backwards.  Pt states "I should have been remembering that as we were walking" re: remembering the address.   He acknowledges that this is not his baseline and worsened cognitive status.  He reports his wife will be staying with him at discharge, and will directly monitor his meds, finances, and he acknowledes the recommendation for no driving at this time    General Comments       Exercises     Shoulder Instructions      Home Living Family/patient expects to be discharged to:: Private residence Living Arrangements: Spouse/significant other Available Help at Discharge: Family;Available 24 hours/day Type of Home: Independent living facility Home Access: Level entry     Home Layout: One level     Bathroom Shower/Tub: Teacher, early years/pre: Standard Bathroom Accessibility: Yes   Home Equipment: Environmental consultant - 2 wheels;Shower seat   Additional Comments: Pt reports he lives with his wife, who does not work and can provide direct supervision at discharge       Prior Functioning/Environment Level of Independence: Independent        Comments: reports no use of AD        OT Problem List: Decreased activity tolerance;Impaired balance (sitting and/or standing);Impaired vision/perception;Decreased coordination;Decreased cognition;Decreased safety awareness;Decreased knowledge of use of DME or AE;Obesity;Pain      OT Treatment/Interventions: Self-care/ADL training;Neuromuscular education;DME and/or  AE instruction;Manual therapy;Splinting;Cognitive remediation/compensation;Patient/family education;Balance training    OT Goals(Current goals can be found in the care plan section) Acute Rehab OT Goals Patient Stated Goal: go home home soon  OT Goal Formulation: With patient Time For Goal Achievement: 01/19/19 Potential to Achieve Goals: Good ADL Goals Additional ADL Goal #1: Pt will perform mod challenging path finding activity with no more than 2 cues Additional ADL Goal #2: Pt will be able to divide attention with min cues  OT Frequency: Min 2X/week   Barriers to D/C:            Co-evaluation              AM-PAC OT "6 Clicks" Daily Activity     Outcome Measure Help from another person eating meals?: None Help from another person taking care of personal grooming?: None Help from another person toileting, which includes using toliet, bedpan, or urinal?: None Help from another person bathing (including washing, rinsing, drying)?: A Little Help from another person to put on and taking off regular upper body clothing?: None Help from another person to put on and taking off regular lower body clothing?: A Little 6 Click Score: 22   End of Session Equipment Utilized During Treatment: Rolling walker Nurse Communication: Mobility status;Patient requests pain meds  Activity Tolerance: Patient tolerated treatment well Patient left: in chair;with call bell/phone within reach;with chair alarm set  OT Visit Diagnosis: Unsteadiness on feet (  R26.81);Cognitive communication deficit (R41.841)                Time: 1400-1433 OT Time Calculation (min): 33 min Charges:  OT General Charges $OT Visit: 1 Visit OT Evaluation $OT Eval Moderate Complexity: 1 Mod OT Treatments $Self Care/Home Management : 8-22 mins  Lucille Passy, OTR/L Acute Rehabilitation Services Pager [phone] Office [phone]   Lucille Passy M 01/12/2019, 3:06 PM

## 2019-01-13 MED ORDER — SENNOSIDES-DOCUSATE SODIUM 8.6-50 MG PO TABS
1.0000 | ORAL_TABLET | Freq: Two times a day (BID) | ORAL | Status: DC
  Administered 2019-01-13 – 2019-01-14 (×2): 1 via ORAL
  Filled 2019-01-13 (×3): qty 1

## 2019-01-13 MED ORDER — ZOLPIDEM TARTRATE 5 MG PO TABS
10.0000 mg | ORAL_TABLET | Freq: Every evening | ORAL | Status: DC | PRN
  Administered 2019-01-13: 10 mg via ORAL
  Filled 2019-01-13: qty 2

## 2019-01-13 MED ORDER — HEPARIN SODIUM (PORCINE) 5000 UNIT/ML IJ SOLN
5000.0000 [IU] | Freq: Three times a day (TID) | INTRAMUSCULAR | Status: DC
  Administered 2019-01-13: 5000 [IU] via SUBCUTANEOUS
  Filled 2019-01-13 (×2): qty 1

## 2019-01-13 NOTE — Plan of Care (Signed)
Pt c/o pain 8/10 at approx 2145 and was given the opportunity to choose different pain medication other than the Vicodin. He stated that he didn't want to change his pain regimen to change b/c Vicodin had worked the best thus far for pain management.

## 2019-01-13 NOTE — Progress Notes (Signed)
Neurosurgery Service Progress Note  Subjective: No acute events overnight, tired but having a lot of difficulty getting to sleep, said he's starting to get agitated and a little cranky, leg strength continues to improve   Objective: Vitals:   01/12/19 1645 01/12/19 2017 01/12/19 2341 01/13/19 0403  BP: 117/80 121/79 110/69 98/67  Pulse: (!) 50 (!) 59 (!) 50 (!) 51  Resp: 16 19 19 17   Temp: 98 F (36.7 C) 98.8 F (37.1 C) 98 F (36.7 C) 98.5 F (36.9 C)  TempSrc: Axillary Oral Oral Oral  SpO2: 99% 96% 97% 97%  Weight:       Temp (24hrs), Avg:98.3 F (36.8 C), Min:98 F (36.7 C), Max:98.8 F (37.1 C)  CBC Latest Ref Rng & Units 01/11/2019 01/09/2019 01/03/2019  WBC 4.0 - 10.5 K/uL - 12.3(H) 9.7  Hemoglobin 13.0 - 17.0 g/dL 12.9(L) 16.6 16.6  Hematocrit 39.0 - 52.0 % 38.0(L) 49.5 49.6  Platelets 150 - 400 K/uL - 321 324   BMP Latest Ref Rng & Units 01/11/2019 01/09/2019 01/03/2019  Glucose 70 - 99 mg/dL - 133(H) 110(H)  BUN 6 - 20 mg/dL - 15 15  Creatinine 0.61 - 1.24 mg/dL - 0.79 0.82  Sodium 135 - 145 mmol/L 139 140 141  Potassium 3.5 - 5.1 mmol/L 3.6 4.5 3.8  Chloride 98 - 111 mmol/L - 102 106  CO2 22 - 32 mmol/L - 29 26  Calcium 8.9 - 10.3 mg/dL - 9.7 9.4    Intake/Output Summary (Last 24 hours) at 01/13/2019 1111 Last data filed at 01/13/2019 0542 Gross per 24 hour  Intake 3 ml  Output 300 ml  Net -297 ml    Current Facility-Administered Medications:  .  0.9 %  sodium chloride infusion, 250 mL, Intravenous, PRN, Erline Levine, MD, Stopped at 01/10/19 1515 .  0.9 %  sodium chloride infusion, , Intravenous, Continuous, Erline Levine, MD .  acetaminophen (TYLENOL) tablet 650 mg, 650 mg, Oral, Q6H PRN **OR** acetaminophen (TYLENOL) suppository 650 mg, 650 mg, Rectal, Q6H PRN, Erline Levine, MD .  acetaminophen (TYLENOL) tablet 650 mg, 650 mg, Oral, Q4H PRN **OR** acetaminophen (TYLENOL) suppository 650 mg, 650 mg, Rectal, Q4H PRN, Erline Levine, MD .  ALPRAZolam Duanne Moron)  tablet 0.25 mg, 0.25 mg, Oral, TID, Erline Levine, MD, 0.25 mg at 01/13/19 1003 .  alum & mag hydroxide-simeth (MAALOX/MYLANTA) 200-200-20 MG/5ML suspension 30 mL, 30 mL, Oral, Q6H PRN, Erline Levine, MD, 30 mL at 01/09/19 2042 .  aspirin EC tablet 81 mg, 81 mg, Oral, Daily, Judith Part, MD, 81 mg at 01/13/19 1002 .  bisacodyl (DULCOLAX) EC tablet 5 mg, 5 mg, Oral, Daily PRN, Erline Levine, MD, 5 mg at 01/13/19 1002 .  bisacodyl (DULCOLAX) suppository 10 mg, 10 mg, Rectal, Daily PRN, Erline Levine, MD .  [COMPLETED] dexamethasone (DECADRON) injection 6 mg, 6 mg, Intravenous, Q6H, 6 mg at 01/11/19 1216 **FOLLOWED BY** [COMPLETED] dexamethasone (DECADRON) injection 4 mg, 4 mg, Intravenous, Q6H, 4 mg at 01/12/19 1145 **FOLLOWED BY** dexamethasone (DECADRON) injection 4 mg, 4 mg, Intravenous, Q8H, Erline Levine, MD, 4 mg at 01/13/19 0542 .  HYDROcodone-acetaminophen (NORCO/VICODIN) 5-325 MG per tablet 1-2 tablet, 1-2 tablet, Oral, Q4H PRN, Erline Levine, MD, 2 tablet at 01/13/19 1002 .  labetalol (NORMODYNE) injection 10-40 mg, 10-40 mg, Intravenous, Q10 min PRN, Erline Levine, MD .  levETIRAcetam (KEPPRA) tablet 1,500 mg, 1,500 mg, Oral, BID, Erline Levine, MD, 1,500 mg at 01/13/19 1003 .  LORazepam (ATIVAN) tablet 0.5-1 mg, 0.5-1 mg, Oral, Q4H  PRN, 1 mg at 01/09/19 1851 **OR** LORazepam (ATIVAN) injection 0.5-2 mg, 0.5-2 mg, Intramuscular, Q4H PRN, Erline Levine, MD, 2 mg at 01/04/19 1541 .  LORazepam (ATIVAN) injection 2 mg, 2 mg, Intravenous, Q4H PRN, Erline Levine, MD, 2 mg at 01/08/19 0930 .  morphine 2 MG/ML injection 1-2 mg, 1-2 mg, Intravenous, Q2H PRN, Erline Levine, MD, 2 mg at 01/11/19 1453 .  ondansetron (ZOFRAN) tablet 4 mg, 4 mg, Oral, Q4H PRN, 4 mg at 01/12/19 1144 **OR** ondansetron (ZOFRAN) injection 4 mg, 4 mg, Intravenous, Q4H PRN, Erline Levine, MD, 4 mg at 01/11/19 2334 .  oxyCODONE-acetaminophen (PERCOCET/ROXICET) 5-325 MG per tablet 1-2 tablet, 1-2 tablet, Oral, Q4H PRN,  Erline Levine, MD, 2 tablet at 01/11/19 0940 .  pantoprazole (PROTONIX) EC tablet 40 mg, 40 mg, Oral, QHS, Erline Levine, MD, 40 mg at 01/12/19 2143 .  phenytoin (DILANTIN) ER capsule 300 mg, 300 mg, Oral, QHS, Erline Levine, MD, 300 mg at 01/12/19 2143 .  polyethylene glycol (MIRALAX / GLYCOLAX) packet 17 g, 17 g, Oral, Daily PRN, Erline Levine, MD, 17 g at 01/13/19 1002 .  promethazine (PHENERGAN) tablet 12.5-25 mg, 12.5-25 mg, Oral, Q4H PRN, Erline Levine, MD .  senna-docusate (Senokot-S) tablet 1 tablet, 1 tablet, Oral, QHS PRN, Erline Levine, MD .  senna-docusate (Senokot-S) tablet 1 tablet, 1 tablet, Oral, BID, Erline Levine, MD, 1 tablet at 01/13/19 1003 .  sodium chloride flush (NS) 0.9 % injection 3 mL, 3 mL, Intravenous, Q12H, Erline Levine, MD, 3 mL at 01/12/19 1030 .  sodium chloride flush (NS) 0.9 % injection 3 mL, 3 mL, Intravenous, PRN, Erline Levine, MD, 3 mL at 01/13/19 0543 .  sodium phosphate (FLEET) 7-19 GM/118ML enema 1 enema, 1 enema, Rectal, Once PRN, Erline Levine, MD .  zolpidem [facility]) tablet 5 mg, 5 mg, Oral, QHS PRN,MR X 1, Erline Levine, MD, 5 mg at 01/10/19 2207   Physical Exam: AOx3, PERRL, EOMI, FS, Strength 5/5 except LLE diffusely 4+/5, SILTx4, no drift Incision c/d/i  Assessment & Plan: 58 y.o. man s/p resection of recurrent meningioma, recovering well.  -continue ASA81 for SSS thrombus -continue PT/OT -start [facility] -SCDs/TEDs -will d/c his dex, could be causing a lot of his agitation and sleep issues, if unable to sleep, will put in a prn for a nonbenzo benzo  Joshua [name]  01/13/19 11:11 AM

## 2019-01-13 NOTE — Progress Notes (Signed)
Physical Therapy Treatment Patient Details Name: Joshua [name] MRN: [id number] DOB: 22-Jul-1960 Today's Date: 01/13/2019    History of Present Illness Pt is a 58 y.o. male admitted 01/03/19 with L-side weakness and recurrent meningioma (s/p R parasagittal meningioma resection 11/06/2012). S/p resection with bihemispheric craniotomy for tumor on 01/10/19. No other significant PMH on file.   PT Comments    Pt progressing well with mobility. Today's session focused on stability with ambulation and dynamic mobility tasks. Pt scored 14/24 on Dynamic Gait Index indicating increased risk for falls with higher level balance activities. Recommend use of RW when ambulating with nursing staff. If wife able to transport pt to appointments, feel he would benefit from follow-up with Outpatient Neuro PT to address these deficits (d/c recs updated for this). Will continue to follow acutely to address established goals.   Follow Up Recommendations  Outpatient PT;Supervision for mobility/OOB(neuro)     Equipment Recommendations  None recommended by PT    Recommendations for Other Services       Precautions / Restrictions Precautions Precautions: Fall Restrictions Weight Bearing Restrictions: No    Mobility  Bed Mobility Overal bed mobility: Independent                Transfers Overall transfer level: Independent Equipment used: None                Ambulation/Gait Ambulation/Gait assistance: Supervision;Min guard Gait Distance (Feet): 500 Feet Assistive device: None Gait Pattern/deviations: Step-through pattern;Decreased stride length;Decreased dorsiflexion - left;Trunk flexed Gait velocity: Decreased Gait velocity interpretation: <1.8 ft/sec, indicate of risk for recurrent falls General Gait Details: Slow, guarded gait without DME, supervision and intermittent min guard for balance. Pt with good ability to increase hip flexion to compensate for L ankle  weakness   Stairs Stairs: (Pt declined stair training)           Wheelchair Mobility    Modified Rankin (Stroke Patients Only)       Balance Overall balance assessment: Needs assistance   Sitting balance-Leahy Scale: Good       Standing balance-Leahy Scale: Fair                   Standardized Balance Assessment Standardized Balance Assessment : Dynamic Gait Index   Dynamic Gait Index Level Surface: Mild Impairment Change in Gait Speed: Mild Impairment Gait with Horizontal Head Turns: Mild Impairment Gait with Vertical Head Turns: Mild Impairment Gait and Pivot Turn: Mild Impairment Step Over Obstacle: Moderate Impairment Step Around Obstacles: Mild Impairment Steps: Moderate Impairment Total Score: 14      Cognition Arousal/Alertness: Awake/alert Behavior During Therapy: WFL for tasks assessed/performed Overall Cognitive Status: No family/caregiver present to determine baseline cognitive functioning                                        Exercises      General Comments        Pertinent Vitals/Pain Pain Assessment: Faces Faces Pain Scale: Hurts little more Pain Location: head Pain Descriptors / Indicators: Headache;Operative site guarding Pain Intervention(s): Monitored during session    Home Living                      Prior Function            PT Goals (current goals can now be found in the care plan section) Acute Rehab PT  Goals Patient Stated Goal: go home home soon  PT Goal Formulation: With patient Potential to Achieve Goals: Good Progress towards PT goals: Progressing toward goals    Frequency    Min 3X/week      PT Plan Discharge plan needs to be updated    Co-evaluation              AM-PAC PT "6 Clicks" Mobility   Outcome Measure  Help needed turning from your back to your side while in a flat bed without using bedrails?: None Help needed moving from lying on your back to sitting  on the side of a flat bed without using bedrails?: None Help needed moving to and from a bed to a chair (including a wheelchair)?: None Help needed standing up from a chair using your arms (e.g., wheelchair or bedside chair)?: None Help needed to walk in hospital room?: A Little Help needed climbing 3-5 steps with a railing? : A Little 6 Click Score: 22    End of Session Equipment Utilized During Treatment: Gait belt Activity Tolerance: Patient tolerated treatment well Patient left: in bed;with bed alarm set;with call bell/phone within reach Nurse Communication: Mobility status PT Visit Diagnosis: Unsteadiness on feet (R26.81);Other abnormalities of gait and mobility (R26.89)     Time: 9407-6808 PT Time Calculation (min) (ACUTE ONLY): 16 min  Charges:  $Neuromuscular Re-education: 8-22 mins                    Mabeline Caras, PT, DPT Acute Rehabilitation Services  Pager [phone] Office [place] 01/13/2019, 9:31 AM

## 2019-01-14 ENCOUNTER — Encounter (HOSPITAL_COMMUNITY): Payer: Self-pay

## 2019-01-14 ENCOUNTER — Ambulatory Visit (HOSPITAL_COMMUNITY): Admission: RE | Admit: 2019-01-14 | Payer: Self-pay | Source: Ambulatory Visit

## 2019-01-14 MED ORDER — ASPIRIN 81 MG PO TBEC: 81.0000 mg | DELAYED_RELEASE_TABLET | Freq: Every day | ORAL | 3 refills | Status: DC

## 2019-01-14 MED ORDER — HYDROCODONE-ACETAMINOPHEN 5-325 MG PO TABS: 1.0000 | ORAL_TABLET | ORAL | 0 refills | Status: DC | PRN

## 2019-01-14 NOTE — Progress Notes (Addendum)
Subjective: Patient reports "Since they stopped the steroid, my head is hurting more"  Objective: Vital signs in last 24 hours: Temp:  [98 F (36.7 C)-99.3 F (37.4 C)] 99.3 F (37.4 C) (06/29 0403) Pulse Rate:  [51-58] 54 (06/29 0403) Resp:  [16-21] 21 (06/29 0403) BP: (98-114)/(66-74) 98/71 (06/29 0403) SpO2:  [95 %-99 %] 96 % (06/29 0403)  Intake/Output from previous day: 06/28 0701 - 06/29 0700 In: -  Out: 300 [Urine:300] Intake/Output this shift: No intake/output data recorded.  Awake, MAEW. Reports nausea since Norco at [place], Reports awakening with headache after a poor night's sleep. (Received Xanax, Ambien, and Norco last night). Drsg intact without new blood. No swelling or erythema. Markedly improved LLE strength since post-op. Ambulated yesterday. No BM yet, after Senecot and Miralax. Refused Fleets.  Refused heparin this am. Nursing reports pt removes SCD's. Nursing also reports sound sleep most of the night.   Lab Results: Recent Labs    01/11/19 1240  HGB 12.9*  HCT 38.0*   BMET Recent Labs    01/11/19 1240  NA 139  K 3.6    Studies/Results: No results found.  Assessment/Plan: Improving physical exam; increasing anxiety  LOS: 11 days  Continue to mobilize, supportive care. Will discuss with Dr. Vertell Limber Heparin, Dilantin, and steroid  for d/c planning.   Verdis Prime 01/14/2019, 7:47 AM   I presented patient's scan today at multidisciplinary brain tumor conference.  I am pleased with appearance of postop scan, but patient will likely need postop RT.  We are awaiting Pathology assessment of recurrent meningioma.  Continue to mobilize patient.  Discharge planning.

## 2019-01-14 NOTE — TOC Transition Note (Signed)
Transition of Care [facility] [place]) - CM/SW Discharge Note   Patient Details  Name: Joshua [name] MRN: [id number] Date of Birth: [birth year]  Transition of [facility]) CM/SW Contact:  Pollie Friar, RN Phone Number: 01/14/2019, 11:25 AM   Clinical Narrative:    Pt discharging home with self care. TOC provided him information on PCP through [facility] and use of their pharmacy.  TOC provided him discount card to assist with the cost of his medications. Wife to provide transport home.    Final next level of care: Home/Self Care Barriers to Discharge: Inadequate or no insurance   Patient Goals and CMS Choice        Discharge Placement                       Discharge Plan and Services   Discharge Planning Services: CM Consult                                 Social Determinants of Health (SDOH) Interventions     Readmission Risk Interventions No flowsheet data found.

## 2019-01-14 NOTE — TOC Initial Note (Signed)
Transition of Care [facility]) - Initial/Assessment Note    Patient Details  Name: Joshua [name] MRN: [id number] Date of Birth: June 17, 1961  Transition of Care [facility]) CM/SW Contact:    Pollie Friar, RN Phone Number: 01/14/2019, 11:25 AM  Clinical Narrative:                   Expected Discharge Plan: Home/Self Care Barriers to Discharge: Inadequate or no insurance   Patient Goals and CMS Choice        Expected Discharge Plan and Services Expected Discharge Plan: Home/Self Care   Discharge Planning Services: CM Consult     Expected Discharge Date: 01/14/19                                    Prior Living Arrangements/Services   Lives with:: Spouse Patient language and need for interpreter reviewed:: Yes(no needs) Do you feel safe going back to the place where you live?: Yes      Need for Family Participation in Patient Care: Yes (Comment) Care giver support system in place?: Yes (comment)(wife)   Criminal Activity/Legal Involvement Pertinent to Current Situation/Hospitalization: No - Comment as needed  Activities of Daily Living Home Assistive Devices/Equipment: None ADL Screening (condition at time of admission) Patient's cognitive ability adequate to safely complete daily activities?: Yes Is the patient deaf or have difficulty hearing?: No Does the patient have difficulty seeing, even when wearing glasses/contacts?: No Does the patient have difficulty concentrating, remembering, or making decisions?: No Patient able to express need for assistance with ADLs?: Yes Does the patient have difficulty dressing or bathing?: No Independently performs ADLs?: Yes (appropriate for developmental age) Does the patient have difficulty walking or climbing stairs?: No Weakness of Legs: Left Weakness of Arms/Hands: Left  Permission Sought/Granted Permission sought to share information with : Case Manager, PCP Permission granted to share information with : Yes, Verbal  Permission Granted              Emotional Assessment Appearance:: Appears stated age Attitude/Demeanor/Rapport: Engaged Affect (typically observed): Accepting, Pleasant Orientation: : Oriented to Self, Oriented to Place, Oriented to  Time, Oriented to Situation   Psych Involvement: No (comment)  Admission diagnosis:  Tremor [R25.1] Meningioma [facility]) [D32.9] Patient Active Problem List   Diagnosis Date Noted  . Meningioma ([place]) 01/03/2019  . Brain tumor ([place]) 12/27/2018  . Diffuse idiopathic skeletal hyperostosis 06/05/2013   PCP:  Patient, No Pcp Per Pharmacy:   [facility] [place] [place] [address] Phone: [phone] Fax: [place], [place] [address] [place] [address] Phone: [phone] Fax: [phone]     Social Determinants of Health (SDOH) Interventions    Readmission Risk Interventions No flowsheet data found.

## 2019-01-14 NOTE — Discharge Summary (Signed)
Physician Discharge Summary  Patient ID: ALTIN SEASE MRN: [id number] DOB/AGE: 02/18/1961 58 y.o.  Admit date: 01/03/2019 Discharge date: 01/14/2019  Admission Diagnoses: Parafalcine Meningioma, Recurrent    Discharge Diagnoses: Parafalcine Meningioma, Recurrent, s/p Resection of parasagittal meningioma with bihemispheric craniotomy for tumor -parasagittal craniotomy for tumor APPLICATION OF CRANIAL NAVIGATION (N/A)     Active Problems:   Meningioma [facility])   Discharged Condition: good  Hospital Course: Joshua [name] was admitted for surgery with recurrent meningioma causing left shoulder, arm, and leg tremor with weakness. Symptoms were initially controlled with steroids and planned surgery date was moved up. Following uncomplicated resection of meningioma, pt recovered well with some LLE weakness as expected. LLE strength continues to improve. Rolling walker in use for stability.   Consults: None  Significant Diagnostic Studies: intraoperative cranial navigation  Treatments: surgery: Resection of parasagittal meningioma with bihemispheric craniotomy for tumor -parasagittal craniotomy for tumor APPLICATION OF CRANIAL NAVIGATION (N/A)    Discharge Exam: Blood pressure 119/69, pulse (!) 50, temperature 98.3 F (36.8 C), temperature source Oral, resp. rate 20, weight 85.3 kg, SpO2 96 %. Awake, MAEW. Reports nausea since Norco at [place], Reports awakening with headache after a poor night's sleep. (Received Xanax, Ambien, and Norco last night). Drsg intact without new blood. No swelling or erythema. Markedly improved LLE strength since post-op. Ambulated yesterday. No BM yet, after Senecot and Miralax. Refused Fleets.  Refused heparin this am. Nursing reports pt removes SCD's. Nursing also reports sound sleep most of the night. I presented patient's scan today at multidisciplinary brain tumor conference. I am pleased with appearance of postop scan, but patient  will likely need postop RT. We are awaiting Pathology assessment of recurrent meningioma. Continue to mobilize patient. Discharge planning.     Disposition: Discharge to home. Rx's for Dilantin ER 300mg  qHS, Keppra 1500mg  BID, ASA 81mg  daily, Norco 5/325 1-2 po q4-6hrs prn pain. Ok to remove dressing and shower, cleansing site gently; may leave open to air if not draining. Return to clinic in 2 weeks for staple removal.    Discharge Instructions    Diet - low sodium heart healthy   Complete by: As directed    Increase activity slowly   Complete by: As directed      Allergies as of 01/14/2019   No Known Allergies     Medication List    STOP taking these medications   dexamethasone 4 MG tablet Commonly known as: DECADRON     TAKE these medications   ALPRAZolam 0.25 MG tablet Commonly known as: XANAX Take 0.25 mg by mouth 3 (three) times daily.   aspirin 81 MG EC tablet Take 1 tablet (81 mg total) by mouth daily.   HYDROcodone-acetaminophen 5-325 MG tablet Commonly known as: NORCO/VICODIN Take 1-2 tablets by mouth every 4 (four) hours as needed for moderate pain.   levETIRAcetam 500 MG tablet Commonly known as: KEPPRA Take 3 tablets (1,500 mg total) by mouth 2 (two) times daily.   phenytoin 300 MG ER capsule Commonly known as: DILANTIN Take 1 capsule (300 mg total) by mouth at bedtime.        Signed: Peggyann Shoals, MD 01/14/2019, 8:59 AM

## 2019-01-14 NOTE — Progress Notes (Signed)
Patient ambulated around circle and to the end of the hall and back to room with walker.   Patient complained about not having a bowel movement in 3 days.  Patient is passing gas, patient given oral senokot however  Patient refused fleet enema   Patient refuses to have SCDs placed on and refusing heparin

## 2019-01-21 ENCOUNTER — Encounter: Payer: Self-pay | Admitting: Family Medicine

## 2019-01-21 ENCOUNTER — Other Ambulatory Visit: Payer: Self-pay

## 2019-01-21 ENCOUNTER — Ambulatory Visit: Payer: Self-pay | Attending: Family Medicine | Admitting: Family Medicine

## 2019-01-21 DIAGNOSIS — R202 Paresthesia of skin: Secondary | ICD-10-CM

## 2019-01-21 DIAGNOSIS — D329 Benign neoplasm of meninges, unspecified: Secondary | ICD-10-CM

## 2019-01-21 NOTE — Progress Notes (Signed)
Virtual Visit via Telephone Note  I connected with Joshua [name], on 01/21/2019 at 8:47 AM by telephone due to the COVID-19 pandemic and verified that I am speaking with the correct person using two identifiers.   Consent: I discussed the limitations, risks, security and privacy concerns of performing an evaluation and management service by telephone and the availability of in person appointments. I also discussed with the patient that there may be a patient responsible charge related to this service. The patient expressed understanding and agreed to proceed.   Location of Patient: Home  Location of Provider: Clinic   Persons participating in Telemedicine visit: Kevon Tench [place]-CMA Dr. Bennie Pierini - spouse    History of Present Illness: 58 year old male with a history of meningioma status post resection in 2014 hospitalized at [facility] from 01/03/2019 through 01/14/2019 for recurrent meningioma status post resection here to establish care. As per spouse recurrent meningioma was identified after he had episodes of seizures and left-sided weakness with associated leg tremor. On 01/10/2019 he underwent right parasagittal craniotomy for tumor. Pathology of tumor revealed meningioma.  Today he and his wife informed me that he received no follow-up appointment with his surgeon Dr. Vertell Limber.  He continues to have left leg and left foot numbness, headaches and he remains on his pain medications and ambulates with a walker. As per his wife, she was not allowed to go into the hospital but receive discharge papers in the car but recalls that Dr. Vertell Limber had informed her he would need radiation and PT which was not set up at the time of his discharge and they are confused. Prior to now he had no primary care physician.   Past Medical History:  Diagnosis Date  . Brain tumor ([place]) 02/20/2013   brain tumor removed in March 2014, Dr Donald Pore  . Dizziness   .  Enlarged prostate   . Headache(784.0)    scattered  . Meningioma ([place])    No Known Allergies  Current Outpatient Medications on File Prior to Visit  Medication Sig Dispense Refill  . ALPRAZolam (XANAX) 0.25 MG tablet Take 0.25 mg by mouth 3 (three) times daily.    Marland Kitchen aspirin EC 81 MG EC tablet Take 1 tablet (81 mg total) by mouth daily. 30 tablet 3  . HYDROcodone-acetaminophen (NORCO/VICODIN) 5-325 MG tablet Take 1-2 tablets by mouth every 4 (four) hours as needed for moderate pain. 30 tablet 0  . levETIRAcetam (KEPPRA) 500 MG tablet Take 3 tablets (1,500 mg total) by mouth 2 (two) times daily. 180 tablet 0  . phenytoin (DILANTIN) 300 MG ER capsule Take 1 capsule (300 mg total) by mouth at bedtime. (Patient not taking: Reported on 01/03/2019) 30 capsule 0   No current facility-administered medications on file prior to visit.     Observations/Objective: Awake, Alert, oriented x3 Not in acute distress   Assessment and Plan: 1. Meningioma ([place]) Status post resection Scheduled for staples removal in 2 weeks which happens to be next week-advised to go into the neurosurgery office to obtain an appointment which was not provided to him at the time of discharge He does have some residual left leg paresthesia and ongoing headaches Continue pain medications Keppra and Dilantin for seizure prophylaxis  2. Paresthesia Secondary to #1 above If symptoms persist, consider gabapentin   Follow Up Instructions: Return in about 1 month (around 02/21/2019) for Coordination of care.    I discussed the assessment and treatment plan with the patient. The patient  was provided an opportunity to ask questions and all were answered. The patient agreed with the plan and demonstrated an understanding of the instructions.   The patient was advised to call back or seek an in-person evaluation if the symptoms worsen or if the condition fails to improve as anticipated.     I provided 20 minutes total of  non-face-to-face time during this encounter including median intraservice time, reviewing previous notes, labs, imaging, medications, management and patient verbalized understanding.     Charlott Rakes, MD, FAAFP. [facility] [place]   01/21/2019, 8:47 AM

## 2019-01-21 NOTE — Progress Notes (Signed)
Patient has been called and DOB has been verified. Patient has been screened and transferred to PCP to start phone visit.     

## 2019-01-25 ENCOUNTER — Other Ambulatory Visit: Payer: Self-pay | Admitting: Radiation Therapy

## 2019-01-30 ENCOUNTER — Telehealth: Payer: Self-pay | Admitting: Radiation Oncology

## 2019-01-30 NOTE — Telephone Encounter (Signed)
Confirmed appt and verified info. °

## 2019-01-30 NOTE — Progress Notes (Signed)
Location/Histology of Brain Tumor: Meningioma posterior parafalcine  Plan: SIM week of 7/27.  6.5 weeks of treatment.  Patient presented to the ED with left sided jerks and twitches, headaches and left lower extremity numbness.  Workup revealed recurrent meningioma previously resected.  MRI Brain 01/11/2019: Interval resection of recurrent parafalcine meningioma with 13 mm focus of enhancing soft tissue at the posterior resection margin which may reflect a small amount of residual tumor. Post op day 1.  MRI Brain 12/27/2018: Residual and/or recurrent right posterior parafalcine meningioma now measuring 7.6 x 3.0 x 4.3 cm.  Associated mild vasogenic edema within the adjacent right parietal lobe, new relative to 2014.   CT Head 12/27/2018: Meningeal mass measuring 5.3 x 3.4 x 1.7 cm is seen predominantly in the right para falcine region superiorly which appears to be extending into or adjacent to superior sagittal sinus.  This appears to be enlarged compared to prior exam.  Pathology Report: Brain tumor resection 01/10/2019   Past or anticipated interventions, if any, per neurosurgery:  Dr. Vertell Limber -Right parasagittal craniotomy 01/10/2019 -Staples removed 01/24/2019  - Follow-up scheduled for 02/04/2019  Past or anticipated interventions, if any, per medical oncology:  Dr. Mickeal Skinner 02/05/2019   Dose of Decadron, if applicable: No  Recent neurologic symptoms, if any:   Seizures: Yes, last one 7/15 lasted about 4-5 hours, jerking on left side.  Headaches: Yes, relieved with pain medication.  Nausea: Occasional nausea when taking pain medications, relieved with antiemetics.  Dizziness/ataxia: Yes  Difficulty with hand coordination: No  Focal numbness/weakness: Weakness on left side leg and feet.   Visual deficits/changes: No  Confusion/Memory deficits: No   SAFETY ISSUES:  Prior radiation? No  Pacemaker/ICD? No  Possible current pregnancy? No  Is the patient on methotrexate? No  Additional Complaints / other details:  -Previous craniotomy and resection of right parasagittal meningioma in 10/2012.

## 2019-01-31 ENCOUNTER — Other Ambulatory Visit: Payer: Self-pay

## 2019-01-31 ENCOUNTER — Encounter: Payer: Self-pay | Admitting: Radiation Oncology

## 2019-01-31 ENCOUNTER — Ambulatory Visit
Admission: RE | Admit: 2019-01-31 | Discharge: 2019-01-31 | Disposition: A | Discharge status: Home / Self Care | Payer: Self-pay | Source: Ambulatory Visit | Attending: Radiation Oncology | Admitting: Radiation Oncology

## 2019-01-31 VITALS — Ht 69.0 in | Wt 180.0 lb

## 2019-01-31 DIAGNOSIS — D320 Benign neoplasm of cerebral meninges: Secondary | ICD-10-CM | POA: Insufficient documentation

## 2019-01-31 DIAGNOSIS — D329 Benign neoplasm of meninges, unspecified: Secondary | ICD-10-CM

## 2019-01-31 DIAGNOSIS — R569 Unspecified convulsions: Secondary | ICD-10-CM

## 2019-01-31 HISTORY — DX: Benign neoplasm of cerebral meninges: D32.0

## 2019-01-31 NOTE — Progress Notes (Addendum)
Radiation Oncology         [phone] ________________________________  Name: Joshua [name]        MRN: [id number]  Date of Service: 01/31/2019 DOB: 20-Dec-1960  WU:[id number], Charlane Ferretti, MD  Erline Levine, MD     REFERRING PHYSICIAN: Erline Levine, MD   DIAGNOSIS: The primary encounter diagnosis was Meningioma [facility]). A diagnosis of Seizures ([place]) was also pertinent to this visit.   HISTORY OF PRESENT ILLNESS: Joshua [name] is a 58 y.o. male seen at the request of Dr. Vertell Limber for a history of recurrent meningioma. The patient developed headaches and visual changes in 2014 that led to the diagnosis of a grade 1 meningioma. 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 He was encouraged to continue in surveillance but reports he decided to forgo this. He did have some mild left lower extremity motor function deficits, but had been able to mange and continue to work full time. He recently developed left lower extremity twitching, progressive headaches, and what he describes as seizure activity which prompted evaluation in the ED on 12/27/2018. 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  The prior resection cavity was within the central aspect of the lesion subjacent to the craniotomy bone flap.  A small amount of vasogenic edema was present. Evidence of invasion of the and adjacent superior sagittal sinus with secondary occlusion obliteration at the level of the tumor was noted.  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  He proceeded with surgical resection on 01/10/2019 and a postop scan revealed postoperative changes seen in the resection set cavity of the parafalcine region.  There was small to moderate volume pneumocephalus without mass-effect.  Fluid-filled resection cavity in the midline at the vertex was noted, focal enhancing tissue in  the posterior margin of the resection cavity measured 13 x 10 mm contiguous with the margin of the residual superior sagittal sinus.  New nonocclusive cordlike filling defects within the superior sagittal sinus extending into the proximal right transverse sinus and smaller more focal nonocclusive filling defects were present in the right transverse and right sigmoid sinuses.  Smooth dural thickening over both cerebral convexities was felt to be postoperative.  Final pathology from surgery revealed a grade 1 meningioma with focal areas of grade 2.  He was counseled on the rationale for adjuvant radiotherapy and is seen via my chart today.     PREVIOUS RADIATION THERAPY: No   PAST MEDICAL HISTORY:  Past Medical History:  Diagnosis Date   Brain tumor ([place]) 02/20/2013   brain tumor removed in March 2014, Dr Donald Pore   Dizziness    Enlarged prostate    Headache(784.0)    scattered   Meningioma [facility])        PAST SURGICAL HISTORY: Past Surgical History:  Procedure Laterality Date   APPLICATION OF CRANIAL NAVIGATION N/A 01/10/2019   Procedure: APPLICATION OF CRANIAL NAVIGATION;  Surgeon: Erline Levine, MD;  Location: [place];  Service: Neurosurgery;  Laterality: N/A;   CATARACT EXTRACTION Right    CRANIOTOMY Right 11/06/2012   Procedure: CRANIOTOMY TUMOR EXCISION;  Surgeon: Erline Levine, MD;  Location: [place] [facility];  Service: Neurosurgery;  Laterality: Right;  Right Parasagittal craniotomy for meningioma with Stealth   CRANIOTOMY Right 01/10/2019   Procedure: Right Parasagittal Craniotomy for Tumor;  Surgeon: Erline Levine, MD;  Location: [place];  Service: Neurosurgery;  Laterality: Right;  Right parasagittal craniotomy for tumor   ESOPHAGOGASTRODUODENOSCOPY     right knee arthroscopy       FAMILY HISTORY: History reviewed. No pertinent family history.   SOCIAL HISTORY:  reports that he has been smoking cigarettes. He has never used smokeless tobacco. He reports current alcohol use  of about 8.0 standard drinks of alcohol per week. He reports that he does not use drugs. The patient is married and lives in [place]. He works at [employer] in the [employer]. He has four adult children and 5 grand children and is originally from [place].    ALLERGIES: Patient has no known allergies.   MEDICATIONS:  Current Outpatient Medications  Medication Sig Dispense Refill   ALPRAZolam (XANAX) 0.25 MG tablet Take 0.25 mg by mouth 3 (three) times daily.     aspirin EC 81 MG EC tablet Take 1 tablet (81 mg total) by mouth daily. 30 tablet 3   HYDROcodone-acetaminophen (NORCO/VICODIN) 5-325 MG tablet Take 1-2 tablets by mouth every 4 (four) hours as needed for moderate pain. 30 tablet 0   levETIRAcetam (KEPPRA) 500 MG tablet Take 3 tablets (1,500 mg total) by mouth 2 (two) times daily. 180 tablet 0   oxyCODONE (OXY IR/ROXICODONE) 5 MG immediate release tablet TK 1 T PO Q 4 TO 6 H PRF PAIN     phenytoin (DILANTIN) 300 MG ER capsule Take 1 capsule (300 mg total) by mouth at bedtime. 30 capsule 0   No current facility-administered medications for this encounter.      REVIEW OF SYSTEMS: On review of systems, the patient reports that he is doing well overall since surgery, however after further discussion, he has had about 5 tonic clonic type seizures since coming home from the hospital. He does have intermittent headaches and discomfort behind his eyes. He denies any visual or speech changes. He reports he is no longer taking dexamethasone but is taking dilantin and Keppra as prescribed. He denies any chest pain, shortness of breath, cough, fevers, chills, night sweats, unintended weight changes. He denies any bowel or bladder disturbances, and denies abdominal pain, nausea or vomiting. He does have limitations from prior surgery with his lower extremities, particularly on the left.  He describes difficulty with gait for several years when walking and with left foot placement. He  has had progressive sensory loss in the LLE below the knee and cannot move his lower left leg on his own. His wife states his foot will drag on the LLE when he tries to walk. He denies any other concerns for new musculoskeletal or joint aches or pains. A complete review of systems is obtained and is otherwise negative.     PHYSICAL EXAM:  Wt Readings from Last 3 Encounters:  01/31/19 180 lb (81.6 kg)  01/10/19 188 lb 0.8 oz (85.3 kg)  12/28/18 188 lb 0.8 oz (85.3 kg)   Pain Assessment Pain Score: 5  Pain Loc: Leg(Bilateral legs, behind both eyes.)/10  In general this is a well appearing African American male in no acute distress. He's alert and oriented x4 and appropriate throughout the examination. Cardiopulmonary assessment is negative for acute distress and he exhibits normal effort. No focal neurologic deficits were noted but examination is limited due to the nature of our encounter.    ECOG = 1  0 - Asymptomatic (Fully active, able to carry on all predisease activities without restriction)  1 - Symptomatic but completely ambulatory (Restricted in physically strenuous activity but ambulatory and able  to carry out work of a light or sedentary nature. For example, light housework, office work)  2 - Symptomatic, <50% in bed during the day (Ambulatory and capable of all self care but unable to carry out any work activities. Up and about more than 50% of waking hours)  3 - Symptomatic, >50% in bed, but not bedbound (Capable of only limited self-care, confined to bed or chair 50% or more of waking hours)  4 - Bedbound (Completely disabled. Cannot carry on any self-care. Totally confined to bed or chair)  5 - Death   Eustace Pen MM, Creech RH, Tormey DC, et al. [phone]). "Toxicity and response criteria of the [facility]". [place] Oncol. 5 (6): 649-55    LABORATORY DATA:  Lab Results  Component Value Date   WBC 12.3 (H) 01/09/2019   HGB 12.9 (L) 01/11/2019    HCT 38.0 (L) 01/11/2019   MCV 95.0 01/09/2019   PLT 321 01/09/2019   Lab Results  Component Value Date   NA 139 01/11/2019   K 3.6 01/11/2019   CL 102 01/09/2019   CO2 29 01/09/2019   Lab Results  Component Value Date   ALT 23 12/27/2018   AST 24 12/27/2018   ALKPHOS 69 12/27/2018   BILITOT 0.7 12/27/2018      RADIOGRAPHY: Ct Head Wo Contrast  Result Date: 01/09/2019 CLINICAL DATA:  Meningioma monitoring EXAM: CT HEAD WITHOUT CONTRAST TECHNIQUE: Contiguous axial images were obtained from the base of the skull through the vertex without intravenous contrast. 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 The appearance of the brain is otherwise normal. Vascular: Persistent invasion of the superior sagittal sinus. Skull: The visualized skull base, calvarium and extracranial soft tissues are normal. Sinuses/Orbits: No fluid levels or advanced mucosal thickening of the visualized paranasal sinuses. No mastoid or middle ear effusion. The orbits are normal. IMPRESSION: Unchanged appearance of large parafalcine meningioma with invasion of the superior sagittal sinus. Electronically Signed   By: Ulyses Jarred M.D.   On: 01/09/2019 22:07   Ct Head Wo Contrast  Result Date: 01/03/2019 CLINICAL DATA:  Worsening tremor. Known meningioma. EXAM: CT HEAD WITHOUT CONTRAST TECHNIQUE: Contiguous axial images were obtained from the base of the skull through the vertex without intravenous contrast. COMPARISON:  Head CT and brain MRI 1 week prior 12/27/2018 FINDINGS: Brain: Known right parafalcine extradural mass with peripheral calcifications measures approximately 5 x 2.2 x 3.0 cm, not significantly changed from prior exam allowing for differences in scan plane and caliper placement. Mild adjacent vasogenic edema in the right parietal lobe. Overall findings are not significantly changed from prior. Minimal right to left midline shift of the  superior falx, also unchanged. No hemorrhage. No hydrocephalus. No evidence of acute ischemia. No intracranial fluid collection. Vascular: No hyperdense vessel. Skull: Prior right parietal and vertex craniotomy. Cortical irregularity of the craniotomy defect in the region of meningioma. No new osseous abnormality. Sinuses/Orbits: No acute finding. Other: None. IMPRESSION: 1. Unchanged head CT over the past week. Right parafalcine meningioma is unchanged in size allowing for differences in scan plane and caliper placement. Mild adjacent vasogenic edema in the right parietal lobe is also unchanged. 2. No new abnormality. Electronically Signed   By: Keith Rake M.D.   On: 01/03/2019 19:13   Mr Jeri Cos TD Contrast  Result Date: 01/11/2019 CLINICAL DATA:  Postop day 1 status post resection of a recurrent parafalcine meningioma. EXAM: MRI HEAD  WITHOUT AND WITH CONTRAST TECHNIQUE: Multiplanar, multiecho pulse sequences of the brain and surrounding structures were obtained without and with intravenous contrast. CONTRAST:  9 mL Gadavist COMPARISON:  01/08/2019 FINDINGS: Brain: Postoperative changes are present from interval repeat craniotomy for resection of recurrent parafalcine meningioma. There is small to moderate volume postoperative pneumocephalus without significant mass effect. There is a fluid-filled resection cavity in the midline at the vertex. The superior sagittal sinus and falx have been partially resected. Focal enhancing tissue at the posterior margin of the resection cavity measures 13 x 10 mm (series 11, image 40). This is contiguous with the margin of the residual superior sagittal sinus. There are new nonocclusive cord-like filling defects within the superior sagittal sinus extending into the proximal right transverse sinus, and additional smaller more focal nonocclusive filling defects are present in the right transverse and right sigmoid sinuses. There is smooth dural thickening over both  cerebral convexities which is likely postoperative. No sizable acute infarct is identified. There is no significant midline shift or ventriculomegaly. A small amount of white matter edema remains in the right frontoparietal region adjacent to the resection cavity. Vascular: Abnormal superior sagittal sinus as above. Major intracranial arterial flow voids are preserved. Skull and upper cervical spine: Bihemispheric craniotomy with small overlying scalp fluid collection. Sinuses/Orbits: Bilateral cataract extraction. Clear paranasal sinuses. Trace left mastoid effusion. Other: None. IMPRESSION: 1. Interval resection of recurrent parafalcine meningioma with 13 mm focus of enhancing soft tissue at the posterior resection margin which may reflect a small amount of residual tumor. This will serve as a baseline for future examinations. 2. Cord like filling defects in the residual superior sagittal sinus compatible with nonocclusive thrombus. Additional smaller foci of nonocclusive thrombus or gas in the right transverse and right sigmoid sinuses. Electronically Signed   By: Logan Bores M.D.   On: 01/11/2019 13:41   Mr Jeri Cos CH Contrast  Result Date: 01/08/2019 CLINICAL DATA:  Meningioma preoperative planning EXAM: MRI HEAD WITHOUT AND WITH CONTRAST TECHNIQUE: Multiplanar, multiecho pulse sequences of the brain and surrounding structures were obtained without and with intravenous contrast. CONTRAST:  10 mL Gadovist IV COMPARISON:  MRI head 12/27/2018 FINDINGS: Brain: Postop surgical resection of convexity meningioma. Large recurrent meningioma over the convexity predominantly on the right side but extending to the left of midline. The mass invades the superior sagittal sinus which appears occluded. There is central tumor necrosis. The mass currently measures approximately 7.6 x 4.0 X 2.7 cm unchanged from the recent study. There is mass-effect on the right frontal parietal lobe over the convexity with mild vasogenic  edema in the right frontal parietal lobe. Diffusion tensor imaging for white matter tract demonstration. Ventricle size is normal. No midline shift. No acute infarct or hemorrhage. Mild chronic white matter changes. Vascular: Normal arterial flow voids Tumor invades the superior sagittal sinus in appears to occlude flow. Skull and upper cervical spine: Right convexity craniotomy. Sinuses/Orbits: Negative Other: None IMPRESSION: Large recurrent convexity meningioma is unchanged from the recent study. There is mild edema in the right frontal parietal white matter. The tumor fills the superior sagittal sinus which appears occluded. Electronically Signed   By: Franchot Gallo M.D.   On: 01/08/2019 13:52       IMPRESSION/PLAN: 1. Recurrent meningioma. Dr. Lisbeth Renshaw discusses the pathology findings and reviews the nature of meningioma. The patient's tumor was removed but there is still residual meningioma on postop imaging, and given the grade 2 component of his histology, Dr. Lisbeth Renshaw discusses the  options of radiotherapy to the brain. We discussed the risks, benefits, short, and long term effects of radiotherapy, and the patient is interested in proceeding. Dr. Lisbeth Renshaw discusses the delivery and logistics of radiotherapy and anticipates a course of 6-6 1/2 weeks of conventional radiotherapy. We will plan to simulate in about 2 weeks time and our staff will contact him to coordinate this appointment.  2. Seizure activity and emotional lability. The patient will see Dr. Mickeal Skinner next week. He has been taking his antiepileptic medications as prescribed. I will reach out to Dr. Mickeal Skinner to see if his appointment needs to be moved up any sooner. We appreciate his input and the patient is aware that he would follow long term with Dr. Mickeal Skinner as well.    This encounter was provided by telemedicine platform: Mychart.  The patient has given verbal consent for this type of encounter and has been advised to only accept a meeting of  this type in a secure network environment. The time spent during this encounter was 45 minutes. The attendants for this meeting include Blenda Nicely, RN, Dr. Lisbeth Renshaw, Hayden Pedro  and [name].  During the encounter,  Blenda Nicely, RN, Dr. Lisbeth Renshaw, and Hayden Pedro were located at [facility] Radiation Oncology Department.  RYOMA NOFZIGER was located at home.      Carola Rhine, PAC   Addendum.  I spoke with Dr. Mickeal Skinner regarding the patient's symptoms, and current medication dosing.  He would like the patient to keep his appointment for Tuesday to formally evaluate the patient prior to making any adjustments in medication.  At this time the patient may have nonepileptic seizure activity.  I have communicated this as well with the patient and his wife.    Carola Rhine, PAC

## 2019-02-05 ENCOUNTER — Other Ambulatory Visit: Payer: Self-pay | Admitting: Internal Medicine

## 2019-02-05 ENCOUNTER — Other Ambulatory Visit: Payer: Self-pay

## 2019-02-05 ENCOUNTER — Inpatient Hospital Stay: Payer: Medicaid Other | Attending: Internal Medicine | Admitting: Internal Medicine

## 2019-02-05 ENCOUNTER — Telehealth: Payer: Self-pay | Admitting: Internal Medicine

## 2019-02-05 VITALS — BP 127/89 | HR 82 | Temp 98.5°F | Resp 18 | Ht 69.0 in | Wt 178.9 lb

## 2019-02-05 DIAGNOSIS — F17210 Nicotine dependence, cigarettes, uncomplicated: Secondary | ICD-10-CM

## 2019-02-05 DIAGNOSIS — R569 Unspecified convulsions: Secondary | ICD-10-CM

## 2019-02-05 DIAGNOSIS — D320 Benign neoplasm of cerebral meninges: Secondary | ICD-10-CM | POA: Insufficient documentation

## 2019-02-05 MED ORDER — LEVETIRACETAM 500 MG PO TABS: 1500.0000 mg | ORAL_TABLET | Freq: Two times a day (BID) | ORAL | 3 refills | Status: DC

## 2019-02-05 MED ORDER — PHENYTOIN SODIUM EXTENDED 300 MG PO CAPS: 300.0000 mg | ORAL_CAPSULE | Freq: Every day | ORAL | 3 refills | Status: DC

## 2019-02-05 NOTE — Progress Notes (Signed)
[facility] [place], [place] [address] [phone]   New Patient Evaluation  Date of Service: 02/05/19 Patient Name: Joshua [name] Patient MRN: [id number] Patient DOB: 05-Jan-1961 Provider: [name], MD  Identifying Statement:  Joshua [name] is a 58 y.o. 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    Referring Provider: No referring provider defined for this encounter.  Oncologic History: 10/30/12: Craniotomy and resection of parasaggital meningioma by Dr. Vertell Limber (WHO I) 01/10/19: Repeat craniotomy, debulking resection by Dr. Vertell Limber after tumor recurrence, seizures  History of Present Illness: The patient's records from the referring physician were obtained and reviewed and the patient interviewed to confirm this HPI.  Joshua [name] to review clinical course and care for his meningioma.  Initially he presented in 2014 with headache syndrome, was found to have tumor on imaging which was resected by Dr. Vertell Limber.  The patient was lost to follow up for ~5 years until he developed seizures, described as "twitching of left leg spreading to arm" this past month.  He required loads of Keppra and Dilantin to break events.  MRI demonstrated significant regrowth of the mass, and repeat craniotomy was performed on 01/10/19.  Small amount of residual tumor was visible on post-operative MRI.  Since surgery he has continued to experience numbness and clumsiness of his left leg, requiring cane for ambulation.  He is still pending home physical therapy evaluation.  He has had "small" seizures, consisting of few seconds of shaking of left leg, occurring less than daily.  Continues on Keppra and Dilantin.    Medications: Current Outpatient Medications on File Prior to Visit  Medication Sig Dispense Refill   ALPRAZolam (XANAX) 0.25 MG tablet Take 0.25 mg by mouth 3 (three) times  daily.     aspirin EC 81 MG EC tablet Take 1 tablet (81 mg total) by mouth daily. 30 tablet 3   HYDROcodone-acetaminophen (NORCO/VICODIN) 5-325 MG tablet Take 1-2 tablets by mouth every 4 (four) hours as needed for moderate pain. 30 tablet 0   oxyCODONE (OXY IR/ROXICODONE) 5 MG immediate release tablet TK 1 T PO Q 4 TO 6 H PRF PAIN     No current facility-administered medications on file prior to visit.     Allergies: No Known Allergies Past Medical History:  Past Medical History:  Diagnosis Date   Brain tumor ([place]) 02/20/2013   brain tumor removed in March 2014, Dr Donald Pore   Dizziness    Enlarged prostate    Headache(784.0)    scattered   Meningioma [facility])    Past Surgical History:  Past Surgical History:  Procedure Laterality Date   APPLICATION OF CRANIAL NAVIGATION N/A 01/10/2019   Procedure: APPLICATION OF CRANIAL NAVIGATION;  Surgeon: Erline Levine, MD;  Location: [facility];  Service: Neurosurgery;  Laterality: N/A;   CATARACT EXTRACTION Right    CRANIOTOMY Right 11/06/2012   Procedure: CRANIOTOMY TUMOR EXCISION;  Surgeon: Erline Levine, MD;  Location: [facility];  Service: Neurosurgery;  Laterality: Right;  Right Parasagittal craniotomy for meningioma with Stealth   CRANIOTOMY Right 01/10/2019   Procedure: Right Parasagittal Craniotomy for Tumor;  Surgeon: Erline Levine, MD;  Location: [facility];  Service: Neurosurgery;  Laterality: Right;  Right parasagittal craniotomy for tumor   ESOPHAGOGASTRODUODENOSCOPY     right knee arthroscopy     Social History:  Social History   Socioeconomic History   Marital status: Married  Spouse name: Not on file   Number of children: Not on file   Years of education: Not on file   Highest education level: Not on file  Occupational History   Not on file  Social Needs   Financial resource strain: Not on file   Food insecurity    Worry: Not on file    Inability: Not on file   Transportation needs    Medical: No     Non-medical: No  Tobacco Use   Smoking status: Current Some Day Smoker    Types: Cigarettes   Smokeless tobacco: Never Used   Tobacco comment: hasn't smoked in a week  Substance and Sexual Activity   Alcohol use: Yes    Alcohol/week: 8.0 standard drinks    Types: 6 Cans of beer, 2 Shots of liquor per week    Comment: occasionally   Drug use: No   Sexual activity: Yes  Lifestyle   Physical activity    Days per week: Not on file    Minutes per session: Not on file   Stress: Not on file  Relationships   Social connections    Talks on phone: Not on file    Gets together: Not on file    Attends religious service: Not on file    Active member of club or organization: Not on file    Attends meetings of clubs or organizations: Not on file    Relationship status: Not on file   Intimate partner violence    Fear of current or ex partner: Not on file    Emotionally abused: Not on file    Physically abused: Not on file    Forced sexual activity: Not on file  Other Topics Concern   Not on file  Social History Narrative   Not on file   Family History: No family history on file.  Review of Systems: Constitutional: Denies fevers, chills or abnormal weight loss Eyes: Denies blurriness of vision Ears, nose, mouth, throat, and face: Denies mucositis or sore throat Respiratory: Denies cough, dyspnea or wheezes Cardiovascular: Denies palpitation, chest discomfort or lower extremity swelling Gastrointestinal:  Denies nausea, constipation, diarrhea GU: Denies dysuria or incontinence Skin: Denies abnormal skin rashes Neurological: Per HPI Musculoskeletal: Denies joint pain, back or neck discomfort. No decrease in ROM Behavioral/Psych: Denies anxiety, disturbance in thought content, and mood instability  Physical Exam: Vitals:   02/05/19 1035  BP: 127/89  Pulse: 82  Resp: 18  Temp: 98.5 F (36.9 C)  SpO2: 98%   KPS: 80. General: Alert, cooperative, pleasant, in no  acute distress Head: Craniotomy scar noted, dry and intact. EENT: No conjunctival injection or scleral icterus. Oral mucosa moist Lungs: Resp effort normal Cardiac: Regular rate and rhythm Abdomen: Soft, non-distended abdomen Skin: No rashes cyanosis or petechiae. Extremities: No clubbing or edema  Neurologic Exam: Mental Status: Awake, alert, attentive to examiner. Oriented to self and environment. Language is fluent with intact comprehension.  Cranial Nerves: Visual acuity is grossly normal. Visual fields are full. Extra-ocular movements intact. No ptosis. Face is symmetric, tongue midline. Motor: Tone and bulk are normal. Power is full in both arms and legs. Reflexes are symmetric, no pathologic reflexes present. Impaired heel to shin left leg Sensory: Diminished to light touch and temp left lower leg Gait: Cane assisted  Labs: I have reviewed the data as listed    Component Value Date/Time   NA 139 01/11/2019 1240   K 3.6 01/11/2019 1240   CL 102 01/09/2019  1032   CO2 29 01/09/2019 1032   GLUCOSE 133 (H) 01/09/2019 1032   BUN 15 01/09/2019 1032   CREATININE 0.79 01/09/2019 1032   CALCIUM 9.7 01/09/2019 1032   PROT 7.3 12/27/2018 1325   ALBUMIN 4.0 12/27/2018 1325   AST 24 12/27/2018 1325   ALT 23 12/27/2018 1325   ALKPHOS 69 12/27/2018 1325   BILITOT 0.7 12/27/2018 1325   GFRNONAA >60 01/09/2019 1032   GFRAA >60 01/09/2019 1032   Lab Results  Component Value Date   WBC 12.3 (H) 01/09/2019   NEUTROABS 8.9 (H) 01/09/2019   HGB 12.9 (L) 01/11/2019   HCT 38.0 (L) 01/11/2019   MCV 95.0 01/09/2019   PLT 321 01/09/2019    Imaging:  Ct Head Wo Contrast  Result Date: 01/09/2019 CLINICAL DATA:  Meningioma monitoring EXAM: CT HEAD WITHOUT CONTRAST TECHNIQUE: Contiguous axial images were obtained from the base of the skull through the vertex without intravenous contrast. COMPARISON:  Head CT 01/03/2019, 12/27/2018 FINDINGS: Brain: Large parafalcine meningioma is unchanged  in size. The appearance of the brain is otherwise normal. Vascular: Persistent invasion of the superior sagittal sinus. Skull: The visualized skull base, calvarium and extracranial soft tissues are normal. Sinuses/Orbits: No fluid levels or advanced mucosal thickening of the visualized paranasal sinuses. No mastoid or middle ear effusion. The orbits are normal. IMPRESSION: Unchanged appearance of large parafalcine meningioma with invasion of the superior sagittal sinus. Electronically Signed   By: Ulyses Jarred M.D.   On: 01/09/2019 22:07   Mr Jeri Cos WF Contrast  Result Date: 01/11/2019 CLINICAL DATA:  Postop day 1 status post resection of a recurrent parafalcine meningioma. EXAM: MRI HEAD WITHOUT AND WITH CONTRAST TECHNIQUE: Multiplanar, multiecho pulse sequences of the brain and surrounding structures were obtained without and with intravenous contrast. CONTRAST:  9 mL Gadavist COMPARISON:  01/08/2019 FINDINGS: Brain: Postoperative changes are present from interval repeat craniotomy for resection of recurrent parafalcine meningioma. There is small to moderate volume postoperative pneumocephalus without significant mass effect. There is a fluid-filled resection cavity in the midline at the vertex. The superior sagittal sinus and falx have been partially resected. Focal enhancing tissue at the posterior margin of the resection cavity measures 13 x 10 mm (series 11, image 40). This is contiguous with the margin of the residual superior sagittal sinus. There are new nonocclusive cord-like filling defects within the superior sagittal sinus extending into the proximal right transverse sinus, and additional smaller more focal nonocclusive filling defects are present in the right transverse and right sigmoid sinuses. There is smooth dural thickening over both cerebral convexities which is likely postoperative. No sizable acute infarct is identified. There is no significant midline shift or ventriculomegaly. A small  amount of white matter edema remains in the right frontoparietal region adjacent to the resection cavity. Vascular: Abnormal superior sagittal sinus as above. Major intracranial arterial flow voids are preserved. Skull and upper cervical spine: Bihemispheric craniotomy with small overlying scalp fluid collection. Sinuses/Orbits: Bilateral cataract extraction. Clear paranasal sinuses. Trace left mastoid effusion. Other: None. IMPRESSION: 1. Interval resection of recurrent parafalcine meningioma with 13 mm focus of enhancing soft tissue at the posterior resection margin which may reflect a small amount of residual tumor. This will serve as a baseline for future examinations. 2. Cord like filling defects in the residual superior sagittal sinus compatible with nonocclusive thrombus. Additional smaller foci of nonocclusive thrombus or gas in the right transverse and right sigmoid sinuses. Electronically Signed   By: Seymour Bars.D.  On: 01/11/2019 13:41   Mr Jeri Cos ZY Contrast  Result Date: 01/08/2019 CLINICAL DATA:  Meningioma preoperative planning EXAM: MRI HEAD WITHOUT AND WITH CONTRAST TECHNIQUE: Multiplanar, multiecho pulse sequences of the brain and surrounding structures were obtained without and with intravenous contrast. CONTRAST:  10 mL Gadovist IV COMPARISON:  MRI head 12/27/2018 FINDINGS: Brain: Postop surgical resection of convexity meningioma. Large recurrent meningioma over the convexity predominantly on the right side but extending to the left of midline. The mass invades the superior sagittal sinus which appears occluded. There is central tumor necrosis. The mass currently measures approximately 7.6 x 4.0 X 2.7 cm unchanged from the recent study. There is mass-effect on the right frontal parietal lobe over the convexity with mild vasogenic edema in the right frontal parietal lobe. Diffusion tensor imaging for white matter tract demonstration. Ventricle size is normal. No midline shift. No acute  infarct or hemorrhage. Mild chronic white matter changes. Vascular: Normal arterial flow voids Tumor invades the superior sagittal sinus in appears to occlude flow. Skull and upper cervical spine: Right convexity craniotomy. Sinuses/Orbits: Negative Other: None IMPRESSION: Large recurrent convexity meningioma is unchanged from the recent study. There is mild edema in the right frontal parietal white matter. The tumor fills the superior sagittal sinus which appears occluded. Electronically Signed   By: Franchot Gallo M.D.   On: 01/08/2019 13:52     Assessment/Plan 1. Meningioma, recurrent of brain ([place])  2. Seizures [facility])  Joshua [name] modest clinical deficits post-operatively which are stable.  He has noted sensory and propioceptive impairment, and also occasional breakthrough focal seizures.  He has completed his decadron taper.  We discussed his case in brain/spine tumor board and recommended treatment of residual tumor with IMRT radiation given islands of atypical cells identified on histology.  He will meet with radiation oncology next week for CT simulation.  We counseled him [name] on epilepsy and epilepsy safety.  He understands to avoid driving until seizure free for 6 months.  We recommended continuing Keppra 1500mg  BID and Dilantin 300mg  HS. Once radiation is complete we will revisit his AED regimen and consider replacement for Keppra given mood swings and irritability.  For now, because he is stable, we will continue current regimen.  Refills were provided.  We appreciate the opportunity to participate in the care of Joshua [name].   Screening for potential clinical trials was performed and discussed using eligibility criteria for active protocols at [facility], loco-regional tertiary centers, as well as national database available on directyarddecor.com.    The patient is not a candidate for a research protocol at this time due to no suitable study identified.   We  spent twenty additional minutes teaching regarding the natural history, biology, and historical experience in the treatment of brain tumors. We then discussed in detail the current recommendations for therapy focusing on the mode of administration, mechanism of action, anticipated toxicities, and quality of life issues associated with this plan. We also provided teaching sheets for the patient to take home as an additional resource.  All questions were answered. The patient knows to call the clinic with any problems, questions or concerns. No barriers to learning were detected.  He will follow up with MRI brain 3 months following radiation therapy.  The total time spent in the encounter was 60 minutes and more than 50% was on counseling and review of test results   [name], MD Medical Director of Neuro-Oncology [facility] [place] 02/05/19 3:14 PM

## 2019-02-05 NOTE — Telephone Encounter (Signed)
No 07/21 los orders or referrals.

## 2019-02-13 ENCOUNTER — Other Ambulatory Visit: Payer: Self-pay

## 2019-02-13 ENCOUNTER — Ambulatory Visit
Admission: RE | Admit: 2019-02-13 | Discharge: 2019-02-13 | Disposition: A | Discharge status: Home / Self Care | Payer: Medicaid Other | Source: Ambulatory Visit | Attending: Radiation Oncology | Admitting: Radiation Oncology

## 2019-02-13 DIAGNOSIS — Z510 Encounter for antineoplastic radiation therapy: Secondary | ICD-10-CM | POA: Insufficient documentation

## 2019-02-13 DIAGNOSIS — C700 Malignant neoplasm of cerebral meninges: Secondary | ICD-10-CM | POA: Diagnosis not present

## 2019-02-18 ENCOUNTER — Emergency Department (HOSPITAL_COMMUNITY): Payer: Medicaid Other

## 2019-02-18 ENCOUNTER — Emergency Department (HOSPITAL_COMMUNITY)
Admission: EM | Admit: 2019-02-18 | Discharge: 2019-02-18 | Disposition: A | Discharge status: Home / Self Care | Payer: Medicaid Other | Attending: Emergency Medicine | Admitting: Emergency Medicine

## 2019-02-18 ENCOUNTER — Other Ambulatory Visit: Payer: Self-pay

## 2019-02-18 ENCOUNTER — Encounter (HOSPITAL_COMMUNITY): Payer: Self-pay | Admitting: Emergency Medicine

## 2019-02-18 DIAGNOSIS — R569 Unspecified convulsions: Secondary | ICD-10-CM | POA: Diagnosis present

## 2019-02-18 DIAGNOSIS — R0602 Shortness of breath: Secondary | ICD-10-CM | POA: Insufficient documentation

## 2019-02-18 DIAGNOSIS — F17210 Nicotine dependence, cigarettes, uncomplicated: Secondary | ICD-10-CM | POA: Diagnosis not present

## 2019-02-18 DIAGNOSIS — Z7982 Long term (current) use of aspirin: Secondary | ICD-10-CM | POA: Insufficient documentation

## 2019-02-18 LAB — CBC WITH DIFFERENTIAL/PLATELET
Abs Immature Granulocytes: 0.04 10*3/uL (ref 0.00–0.07)
Basophils Absolute: 0.1 10*3/uL (ref 0.0–0.1)
Basophils Relative: 1 %
Eosinophils Absolute: 0.2 10*3/uL (ref 0.0–0.5)
Eosinophils Relative: 3 %
HCT: 47.2 % (ref 39.0–52.0)
Hemoglobin: 15.6 g/dL (ref 13.0–17.0)
Immature Granulocytes: 1 %
Lymphocytes Relative: 39 %
Lymphs Abs: 2.8 10*3/uL (ref 0.7–4.0)
MCH: 31.2 pg (ref 26.0–34.0)
MCHC: 33.1 g/dL (ref 30.0–36.0)
MCV: 94.4 fL (ref 80.0–100.0)
Monocytes Absolute: 0.8 10*3/uL (ref 0.1–1.0)
Monocytes Relative: 12 %
Neutro Abs: 3.3 10*3/uL (ref 1.7–7.7)
Neutrophils Relative %: 44 %
Platelets: 270 10*3/uL (ref 150–400)
RBC: 5 MIL/uL (ref 4.22–5.81)
RDW: 12.2 % (ref 11.5–15.5)
WBC: 7.2 10*3/uL (ref 4.0–10.5)
nRBC: 0 % (ref 0.0–0.2)

## 2019-02-18 LAB — COMPREHENSIVE METABOLIC PANEL
ALT: 41 U/L (ref 0–44)
AST: 29 U/L (ref 15–41)
Albumin: 4 g/dL (ref 3.5–5.0)
Alkaline Phosphatase: 96 U/L (ref 38–126)
Anion gap: 14 (ref 5–15)
BUN: 9 mg/dL (ref 6–20)
CO2: 24 mmol/L (ref 22–32)
Calcium: 9.4 mg/dL (ref 8.9–10.3)
Chloride: 104 mmol/L (ref 98–111)
Creatinine, Ser: 0.87 mg/dL (ref 0.61–1.24)
GFR calc Af Amer: >60 mL/min (ref >60)
GFR calc non Af Amer: >60 mL/min (ref >60)
Glucose, Bld: 132 mg/dL — ABNORMAL HIGH (ref 70–99)
Potassium: 4.4 mmol/L (ref 3.5–5.1)
Sodium: 142 mmol/L (ref 135–145)
Total Bilirubin: 0.4 mg/dL (ref 0.3–1.2)
Total Protein: 6.8 g/dL (ref 6.5–8.1)

## 2019-02-18 LAB — POCT I-STAT EG7
Acid-Base Excess: 1 mmol/L (ref 0.0–2.0)
Bicarbonate: 26.1 mmol/L (ref 20.0–28.0)
Calcium, Ion: 1.17 mmol/L (ref 1.15–1.40)
HCT: 47 % (ref 39.0–52.0)
Hemoglobin: 16 g/dL (ref 13.0–17.0)
O2 Saturation: 53 %
Potassium: 4.2 mmol/L (ref 3.5–5.1)
Sodium: 141 mmol/L (ref 135–145)
TCO2: 27 mmol/L (ref 22–32)
pCO2, Ven: 44.4 mmHg (ref 44.0–60.0)
pH, Ven: 7.378 (ref 7.250–7.430)
pO2, Ven: 29 mmHg — CL (ref 32.0–45.0)

## 2019-02-18 LAB — I-STAT CREATININE, ED: Creatinine, Ser: 0.8 mg/dL (ref 0.61–1.24)

## 2019-02-18 MED ORDER — SODIUM CHLORIDE 0.9 % IV BOLUS
1000.0000 mL | Freq: Once | INTRAVENOUS | Status: AC
  Administered 2019-02-18: 09:00:00 1000 mL via INTRAVENOUS

## 2019-02-18 MED ORDER — LORAZEPAM 2 MG/ML IJ SOLN
INTRAMUSCULAR | Status: AC
  Filled 2019-02-18: qty 1

## 2019-02-18 MED ORDER — CLONAZEPAM 0.5 MG PO TABS
0.5000 mg | ORAL_TABLET | Freq: Once | ORAL | Status: AC
  Administered 2019-02-18: 0.5 mg via ORAL
  Filled 2019-02-18: qty 1

## 2019-02-18 MED ORDER — LORAZEPAM 2 MG/ML IJ SOLN
1.0000 mg | Freq: Once | INTRAMUSCULAR | Status: AC
  Administered 2019-02-18: 13:00:00 1 mg via INTRAVENOUS
  Filled 2019-02-18: qty 1

## 2019-02-18 MED ORDER — LORAZEPAM 2 MG/ML IJ SOLN: 2.0000 mg | Freq: Once | INTRAMUSCULAR | Status: DC

## 2019-02-18 MED ORDER — CLONAZEPAM 0.5 MG PO TABS: ORAL_TABLET | ORAL | 0 refills | Status: DC

## 2019-02-18 MED ORDER — LORAZEPAM 2 MG/ML IJ SOLN
1.0000 mg | Freq: Once | INTRAMUSCULAR | Status: AC
  Administered 2019-02-18: 09:00:00 1 mg via INTRAVENOUS

## 2019-02-18 MED FILL — clonazePAM 0.5 MG TABS: 0.5 | 14 days supply | Qty: 11 | Fill #0

## 2019-02-18 NOTE — ED Notes (Signed)
Called lab, will add on phenytoin level

## 2019-02-18 NOTE — Discharge Instructions (Signed)
Follow-up with your oncologist in the office.  Start radiation as planned.  Please return for worsening symptoms.

## 2019-02-18 NOTE — ED Notes (Signed)
Patient transported to CT 

## 2019-02-18 NOTE — Consult Note (Signed)
Reason for Consult:seizure Referring Physician:Floyd  [name] is an 58 y.o. male.  HPI: Joshua [name] is a 58 y.o. 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  Surgeries were done on 11/06/2012 and also 01/10/2019.  Prior to finding the meningioma per wife and husband patient had had 2 seizures which were left-sided including the leg and arm and described as simple partial.  The patient was admitted to hospital and had lengthy hospitalization prior to surgery.  Seizures were difficult to control and required two agents.  Mostly they were simple partial in nature.  Since his most recent resection, seizures have been much improved.  After resection patient was placed on antiepileptic medications.  Currently he is on Keppra 1500 mg twice daily along with Dilantin 300 mg nightly and takes 0.25 mg Xanax 3 times daily.  Wife states that since the resection he has had seizures that are described as simple partial and marching.  Apparently they start in the leg and move up the left side of his body.  Today his seizure was different as it also affected his right leg for a period of time.  Wife states the seizure lasted about 30 to 40 minutes.  Due to the fact that she feels EMS takes a long time to get to her how she drove patient to the hospital.  Currently patient is not having any further seizures.  Wife also states, about 5 days ago he was taken off the Xanax by his oncologist.  There was also mention that she feels as though he is anxiety ridden, and at times possibly agitated.  She oftentimes gives him Xanax to help with the agitation.  She is not sure if it is the medication.  Patient is scheduled to have radiation and chemotherapy for his meningioma.  Patient has recently seen Dr. Mickeal Skinner who is his oncologist who has stated that he prefer to keep him on Keppra at current dose and Dilantin at current dose.  He would like to  revisit the Keppra given his mood swings and irritability after radiation is complete.  Patient states that since resection he has been approximately weak bilaterally in the legs more on the left.  He is unable to move his left toes.  He also has decreased sensation to cold  and light touch from his toes up to mid-shin  Generally, patient has been doing well following his surgery.  He is to start RT for residual meningioma and in light of aggressive nature of recurrent tumor in two days.  He did have some left leg weakness after surgery, but on to recover leg strength well.     Past Medical History:  Diagnosis Date  . Brain tumor ([place]) 02/20/2013   brain tumor removed in March 2014, Dr Donald Pore  . Dizziness   . Enlarged prostate   . Headache(784.0)    scattered  . Meningioma [facility])     Past Surgical History:  Procedure Laterality Date  . APPLICATION OF CRANIAL NAVIGATION N/A 01/10/2019   Procedure: APPLICATION OF CRANIAL NAVIGATION;  Surgeon: Erline Levine, MD;  Location: [place];  Service: Neurosurgery;  Laterality: N/A;  . CATARACT EXTRACTION Right   . CRANIOTOMY Right 11/06/2012   Procedure: CRANIOTOMY TUMOR EXCISION;  Surgeon: Erline Levine, MD;  Location: [place] [facility];  Service: Neurosurgery;  Laterality: Right;  Right Parasagittal craniotomy for meningioma with Stealth  . CRANIOTOMY Right 01/10/2019   Procedure:  Right Parasagittal Craniotomy for Tumor;  Surgeon: Erline Levine, MD;  Location: [place];  Service: Neurosurgery;  Laterality: Right;  Right parasagittal craniotomy for tumor  . ESOPHAGOGASTRODUODENOSCOPY    . right knee arthroscopy      Family History  Problem Relation Age of Onset  . Hypertension Mother   . Hypertension Father     Social History:  reports that he has been smoking cigarettes. He has never used smokeless tobacco. He reports current alcohol use of about 8.0 standard drinks of alcohol per week. He reports that he does not use drugs.  Allergies: No Known  Allergies  Medications: I have reviewed the patient's current medications.  Results for orders placed or performed during the hospital encounter of 02/18/19 (from the past 48 hour(s))  CBC with Differential     Status: None   Collection Time: 02/18/19  9:20 AM  Result Value Ref Range   WBC 7.2 4.0 - 10.5 K/uL   RBC 5.00 4.22 - 5.81 MIL/uL   Hemoglobin 15.6 13.0 - 17.0 g/dL   HCT 47.2 39.0 - 52.0 %   MCV 94.4 80.0 - 100.0 fL   MCH 31.2 26.0 - 34.0 pg   MCHC 33.1 30.0 - 36.0 g/dL   RDW 12.2 11.5 - 15.5 %   Platelets 270 150 - 400 K/uL   nRBC 0.0 0.0 - 0.2 %   Neutrophils Relative % 44 %   Neutro Abs 3.3 1.7 - 7.7 K/uL   Lymphocytes Relative 39 %   Lymphs Abs 2.8 0.7 - 4.0 K/uL   Monocytes Relative 12 %   Monocytes Absolute 0.8 0.1 - 1.0 K/uL   Eosinophils Relative 3 %   Eosinophils Absolute 0.2 0.0 - 0.5 K/uL   Basophils Relative 1 %   Basophils Absolute 0.1 0.0 - 0.1 K/uL   Immature Granulocytes 1 %   Abs Immature Granulocytes 0.04 0.00 - 0.07 K/uL    Comment: Performed at [facility], [address] [place], [place] [address]  Comprehensive metabolic panel     Status: Abnormal   Collection Time: 02/18/19  9:20 AM  Result Value Ref Range   Sodium 142 135 - 145 mmol/L   Potassium 4.4 3.5 - 5.1 mmol/L   Chloride 104 98 - 111 mmol/L   CO2 24 22 - 32 mmol/L   Glucose, Bld 132 (H) 70 - 99 mg/dL   BUN 9 6 - 20 mg/dL   Creatinine, Ser 0.87 0.61 - 1.24 mg/dL   Calcium 9.4 8.9 - 10.3 mg/dL   Total Protein 6.8 6.5 - 8.1 g/dL   Albumin 4.0 3.5 - 5.0 g/dL   AST 29 15 - 41 U/L   ALT 41 0 - 44 U/L   Alkaline Phosphatase 96 38 - 126 U/L   Total Bilirubin 0.4 0.3 - 1.2 mg/dL   GFR calc non Af Amer >60 >60 mL/min   GFR calc Af Amer >60 >60 mL/min   Anion gap 14 5 - 15    Comment: Performed at [facility], [place] [address] [place], [place] [address]  I-Stat Creatinine, ED (not at [facility])     Status: None   Collection Time: 02/18/19  9:28 AM  Result Value Ref Range    Creatinine, Ser 0.80 0.61 - 1.24 mg/dL  POCT I-Stat EG7     Status: Abnormal   Collection Time: 02/18/19  9:29 AM  Result Value Ref Range   pH, Ven 7.378 7.250 - 7.430   pCO2,  Ven 44.4 44.0 - 60.0 mmHg   pO2, Ven 29.0 (LL) 32.0 - 45.0 mmHg   Bicarbonate 26.1 20.0 - 28.0 mmol/L   TCO2 27 22 - 32 mmol/L   O2 Saturation 53.0 %   Acid-Base Excess 1.0 0.0 - 2.0 mmol/L   Sodium 141 135 - 145 mmol/L   Potassium 4.2 3.5 - 5.1 mmol/L   Calcium, Ion 1.17 1.15 - 1.40 mmol/L   HCT 47.0 39.0 - 52.0 %   Hemoglobin 16.0 13.0 - 17.0 g/dL   Patient temperature HIDE    Sample type VENOUS    Comment NOTIFIED PHYSICIAN     Ct Head Wo Contrast  Result Date: 02/18/2019 CLINICAL DATA:  Grand mal seizure. Status post recent craniotomy for removal of meningioma EXAM: CT HEAD WITHOUT CONTRAST TECHNIQUE: Contiguous axial images were obtained from the base of the skull through the vertex without intravenous contrast. COMPARISON:  January 09, 2019. FINDINGS: Brain: Ventricles are normal in size and configuration. There has been recent removal of a posterior right parafalcine meningioma with mild presumed postoperative edema and fluid in the area of previous surgery. On the sagittal images, there is an area of increased attenuation measuring 1.3 x 1.0 cm which potentially may represent a small amount of residual mass along the posterior aspect of previous meningioma. No other findings elsewhere suggesting potential mass. No acute hemorrhage evident. No midline shift or extra-axial fluid collection. No evident acute infarct. Vascular: There is no hyperdense vessel. No vascular calcification evident. Skull: There is postoperative craniotomy change in the vertex regions, anteriorly and posteriorly. Bony calvarium otherwise appears intact. There is soft tissue edema in the scalp at the site of recent craniotomy. Sinuses/Orbits: There is mucosal thickening in several ethmoid air cells. Other visualized paranasal sinuses are clear.  Orbits appear symmetric bilaterally. Other: Mastoid air cells are clear. IMPRESSION: Status post right posterior right parafalcine meningioma removal. On sagittal image, there is a questionable small amount of residual mass in this area. There is postoperative edema and fluid in the area of recent surgical removal. Elsewhere brain parenchyma appears unremarkable. No acute infarct or hemorrhagic focus. Postoperative changes noted in the scalp of the vertex. There is mucosal thickening in several ethmoid air cells. Comment: MR with contrast may be helpful to assess for potential residual meningioma tissue in the postoperative area. Electronically Signed   By: Lowella Grip III M.D.   On: 02/18/2019 09:52    Review of Systems - Negative except per HPI    Blood pressure 113/78, pulse 74, temperature 98.4 F (36.9 C), temperature source Oral, resp. rate 19, height 5\' 9"  (1.753 m), weight 81.1 kg, SpO2 98 %. Physical Exam  Constitutional: He is oriented to person, place, and time. He appears well-developed and well-nourished.  HENT:  Head: Normocephalic.  Healed craniotomy incision  Eyes: Pupils are equal, round, and reactive to light. EOM are normal.  Neck: Normal range of motion. Neck supple.  Neurological: He is alert and oriented to person, place, and time. No cranial nerve deficit or sensory deficit. GCS eye subscore is 4. GCS verbal subscore is 5. GCS motor subscore is 6.  Patient has weakness in left foot, most notably in toes.  He has mild weakness in PF and DF and has greater than antigravity strength in proximal left leg.  Right leg strength is normal.      Assessment/Plan: Seizure today.  I have asked Dr. Tyrone Nine to consult Neurology to assist with seizure management.  Post-resection scan looks good.  Patient is scheduled to begin cranial radiation therapy in two days.  I agree that cessation of xanax may have lowered seizure threshold.  Peggyann Shoals, MD 02/18/2019, 1:05 PM

## 2019-02-18 NOTE — ED Provider Notes (Signed)
[facility] EMERGENCY DEPARTMENT Provider Note   CSN: [id number] Arrival date & time: 02/18/19  2703    History   Chief Complaint Chief Complaint  Patient presents with   Shortness of Breath   Seizures    HPI Joshua [name] is a 58 y.o. [name].     [name] with a chief complaints of seizure-like activity.  Patient has had shaking of his left leg that went up to his arm and his back and then he had a grand mall seizure at home.  Patient has had a recent resection of a intracranial tumor.  Patient is on Keppra and Dilantin.  States he has been compliant with his medications.  No infectious symptoms at home.  No head injuries.  He was recently taken off his Xanax.  Patient feels that his right side is involved with tremors which it was not previously.  Patient had similar symptoms when he was initially admitted to the hospital.  Had shortness of breath without and endorses the same.  Denies cough or fever.  The history is provided by the patient.  Shortness of Breath Associated symptoms: no abdominal pain, no chest pain, no fever, no headaches, no rash and no vomiting   Seizures Illness Severity:  Moderate Onset quality:  Gradual Duration:  1 day Timing:  Constant Progression:  Worsening Chronicity:  Recurrent Associated symptoms: shortness of breath   Associated symptoms: no abdominal pain, no chest pain, no congestion, no diarrhea, no fever, no headaches, no myalgias, no rash and no vomiting     Past Medical History:  Diagnosis Date   Brain tumor ([place]) 02/20/2013   brain tumor removed in March 2014, Dr Donald Pore   Dizziness    Enlarged prostate    Headache(784.0)    scattered   Meningioma [facility])     Patient Active Problem List   Diagnosis Date Noted   Seizures ([place]) 01/31/2019   Meningioma, recurrent of brain ([place]) 01/31/2019   Meningioma ([place]) 01/03/2019   Brain tumor ([place]) 12/27/2018   Diffuse idiopathic skeletal hyperostosis  06/05/2013    Past Surgical History:  Procedure Laterality Date   APPLICATION OF CRANIAL NAVIGATION N/A 01/10/2019   Procedure: APPLICATION OF CRANIAL NAVIGATION;  Surgeon: Erline Levine, MD;  Location: [place];  Service: Neurosurgery;  Laterality: N/A;   CATARACT EXTRACTION Right    CRANIOTOMY Right 11/06/2012   Procedure: CRANIOTOMY TUMOR EXCISION;  Surgeon: Erline Levine, MD;  Location: [place] [facility];  Service: Neurosurgery;  Laterality: Right;  Right Parasagittal craniotomy for meningioma with Stealth   CRANIOTOMY Right 01/10/2019   Procedure: Right Parasagittal Craniotomy for Tumor;  Surgeon: Erline Levine, MD;  Location: [place];  Service: Neurosurgery;  Laterality: Right;  Right parasagittal craniotomy for tumor   ESOPHAGOGASTRODUODENOSCOPY     right knee arthroscopy          Home Medications    Prior to Admission medications   Medication Sig Start Date End Date Taking? Authorizing Provider  ALPRAZolam (XANAX) 0.25 MG tablet Take 0.25 mg by mouth 3 (three) times daily. 01/02/19  Yes [provider]  aspirin EC 81 MG EC tablet Take 1 tablet (81 mg total) by mouth daily. 01/14/19  Yes Erline Levine, MD  FIBER ADULT GUMMIES PO Take 1 tablet by mouth daily.   Yes [provider]  levETIRAcetam (KEPPRA) 500 MG tablet Take 3 tablets (1,500 mg total) by mouth 2 (two) times daily. 02/05/19  Yes Vaslow, Acey Lav, MD  Melatonin 5  MG CHEW Chew 3 tablets by mouth at bedtime as needed (sleep).   Yes [provider]  naproxen sodium (ALEVE) 220 MG tablet Take 440 mg by mouth as needed (pain or headache).   Yes [provider]  phenytoin (DILANTIN) 300 MG ER capsule Take 1 capsule (300 mg total) by mouth at bedtime. 02/05/19  Yes Vaslow, Acey Lav, MD  vitamin C (ASCORBIC ACID) 500 MG tablet Take 500 mg by mouth daily.   Yes [provider]  clonazePAM (KLONOPIN) 0.5 MG tablet Take 1 tablet/day for the next 7 days.  The next week then take 0.5 tablets a  day for the next 7 days. 02/18/19   Deno Etienne, DO  HYDROcodone-acetaminophen (NORCO/VICODIN) 5-325 MG tablet Take 1-2 tablets by mouth every 4 (four) hours as needed for moderate pain. Patient not taking: Reported on 02/18/2019 01/14/19   Erline Levine, MD    Family History Family History  Problem Relation Age of Onset   Hypertension Mother    Hypertension Father     Social History Social History   Tobacco Use   Smoking status: Current Some Day Smoker    Types: Cigarettes   Smokeless tobacco: Never Used   Tobacco comment: hasn't smoked in a week  Substance Use Topics   Alcohol use: Yes    Alcohol/week: 8.0 standard drinks    Types: 6 Cans of beer, 2 Shots of liquor per week    Comment: occasionally   Drug use: No     Allergies   Patient has no known allergies.   Review of Systems Review of Systems  Constitutional: Negative for chills and fever.  HENT: Negative for congestion and facial swelling.   Eyes: Negative for discharge and visual disturbance.  Respiratory: Positive for shortness of breath.   Cardiovascular: Negative for chest pain and palpitations.  Gastrointestinal: Negative for abdominal pain, diarrhea and vomiting.  Musculoskeletal: Negative for arthralgias and myalgias.  Skin: Negative for color change and rash.  Neurological: Positive for seizures. Negative for tremors, syncope and headaches.  Psychiatric/Behavioral: Negative for confusion and dysphoric mood.     Physical Exam Updated Vital Signs BP 112/77    Pulse 84    Temp 98.4 F (36.9 C) (Oral)    Resp (!) 22    Ht 5\' 9"  (1.753 m)    Wt 81.1 kg    SpO2 96%    BMI 26.42 kg/m   Physical Exam Vitals signs and nursing note reviewed.  Constitutional:      Appearance: He is well-developed.  HENT:     Head: Normocephalic and atraumatic.  Eyes:     Pupils: Pupils are equal, round, and reactive to light.  Neck:     Musculoskeletal: Normal range of motion and neck supple.     Vascular: No JVD.   Cardiovascular:     Rate and Rhythm: Normal rate and regular rhythm.     Heart sounds: No murmur. No friction rub. No gallop.   Pulmonary:     Effort: No respiratory distress.     Breath sounds: No wheezing.  Abdominal:     General: There is no distension.     Tenderness: There is no guarding or rebound.  Musculoskeletal: Normal range of motion.  Skin:    Coloration: Skin is not pale.     Findings: No rash.  Neurological:     Mental Status: He is alert and oriented to person, place, and time.     GCS: GCS eye subscore is  4. GCS verbal subscore is 5. GCS motor subscore is 6.     Cranial Nerves: Cranial nerves are intact.     Sensory: Sensation is intact.     Motor: Motor function is intact.     Coordination: Coordination is intact.     Deep Tendon Reflexes: Babinski sign absent on the right side. Babinski sign absent on the left side.     Reflex Scores:      Tricep reflexes are 2+ on the right side and 2+ on the left side.      Bicep reflexes are 2+ on the right side and 2+ on the left side.      Brachioradialis reflexes are 2+ on the right side and 2+ on the left side.      Patellar reflexes are 2+ on the right side and 2+ on the left side.      Achilles reflexes are 2+ on the right side and 2+ on the left side.    Comments: Tremor diffuse, worse to the extremities.   Psychiatric:        Behavior: Behavior normal.      ED Treatments / Results  Labs (all labs ordered are listed, but only abnormal results are displayed) Labs Reviewed  COMPREHENSIVE METABOLIC PANEL - Abnormal; Notable for the following components:      Result Value   Glucose, Bld 132 (*)    All other components within normal limits  POCT I-STAT EG7 - Abnormal; Notable for the following components:   pO2, Ven 29.0 (*)    All other components within normal limits  CBC WITH DIFFERENTIAL/PLATELET  PHENYTOIN LEVEL, FREE AND TOTAL  I-STAT CREATININE, ED    EKG EKG Interpretation  Date/Time:  Monday  February 18 2019 09:12:24 EDT Ventricular Rate:  83 PR Interval:    QRS Duration: 88 QT Interval:  373 QTC Calculation: 439 R Axis:   86 Text Interpretation:  Normal sinus rhythm No significant change since last tracing Confirmed by Deno Etienne [phone]) on 02/18/2019 10:09:00 AM   Radiology Ct Head Wo Contrast  Result Date: 02/18/2019 CLINICAL DATA:  Grand mal seizure. Status post recent craniotomy for removal of meningioma EXAM: CT HEAD WITHOUT CONTRAST TECHNIQUE: Contiguous axial images were obtained from the base of the skull through the vertex without intravenous contrast. COMPARISON:  January 09, 2019. FINDINGS: Brain: Ventricles are normal in size and configuration. There has been recent removal of a posterior right parafalcine meningioma with mild presumed postoperative edema and fluid in the area of previous surgery. On the sagittal images, there is an area of increased attenuation measuring 1.3 x 1.0 cm which potentially may represent a small amount of residual mass along the posterior aspect of previous meningioma. No other findings elsewhere suggesting potential mass. No acute hemorrhage evident. No midline shift or extra-axial fluid collection. No evident acute infarct. Vascular: There is no hyperdense vessel. No vascular calcification evident. Skull: There is postoperative craniotomy change in the vertex regions, anteriorly and posteriorly. Bony calvarium otherwise appears intact. There is soft tissue edema in the scalp at the site of recent craniotomy. Sinuses/Orbits: There is mucosal thickening in several ethmoid air cells. Other visualized paranasal sinuses are clear. Orbits appear symmetric bilaterally. Other: Mastoid air cells are clear. IMPRESSION: Status post right posterior right parafalcine meningioma removal. On sagittal image, there is a questionable small amount of residual mass in this area. There is postoperative edema and fluid in the area of recent surgical removal. Elsewhere brain  parenchyma appears  unremarkable. No acute infarct or hemorrhagic focus. Postoperative changes noted in the scalp of the vertex. There is mucosal thickening in several ethmoid air cells. Comment: MR with contrast may be helpful to assess for potential residual meningioma tissue in the postoperative area. Electronically Signed   By: Lowella Grip III M.D.   On: 02/18/2019 09:52    Procedures Procedures (including critical care time)  Medications Ordered in ED Medications  clonazePAM (KLONOPIN) tablet 0.5 mg (has no administration in time range)  LORazepam (ATIVAN) injection 1 mg (1 mg Intravenous Given 02/18/19 0919)  sodium chloride 0.9 % bolus 1,000 mL (0 mLs Intravenous Stopped 02/18/19 1130)  LORazepam (ATIVAN) injection 1 mg (1 mg Intravenous Given 02/18/19 1302)     Initial Impression / Assessment and Plan / ED Course  I have reviewed the triage vital signs and the nursing notes.  Pertinent labs & imaging results that were available during my care of the patient were reviewed by me and considered in my medical decision making (see chart for details).        [name] with a chief complaints of a tremor to his lower extremities started on the left lower extremity went up to the left upper extremity and then he had a grand mal seizure.  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  This was at the beginning of June.  Patient had a meningioma removed by Dr. Vertell Limber on 25 June.  Since then had some left-sided weakness that is gotten somewhat better but he felt that it reoccurred this morning with the tremor.  He had recently seen an oncologist as an outpatient who had taken him off his Xanax is and has had increased anxiety.  He had a significant tremor on my initial exam and was given Ativan with complete resolution.  CT scan of the head was rapidly performed and there was some concern for a questionable small amount of residual mass.  Radiology is  recommending a MRI.  Will discuss with neurosurgery.  I discussed the case with Dr. Vertell Limber.  He evaluated the patient at bedside.  He also reviewed the images and felt that this was typical of postop.  Felt that the patient likely was having seizures and recommended having neurology evaluation.  I discussed case with Dr. Saralyn Pilar who evaluated the patient at bedside.  He recommended giving the patient another dose of Ativan to see if that would make the patient symptom-free.  His so he recommended the patient be discharged on a tapered dose of Klonopin and following up with his oncologist in the office.  Patient was given another dose of Ativan with complete resolution.  He is requesting discharge home at this time.  We will give a dose of Klonopin here and then a prescription for the next 2 weeks.  1:42 PM:  I have discussed the diagnosis/risks/treatment options with the patient and family and believe the pt to be eligible for discharge home to follow-up with PCP, oncology, neurosurgery. We also discussed returning to the ED immediately if new or worsening sx occur. We discussed the sx which are most concerning (e.g., recurrent seizure) that necessitate immediate return. Medications administered to the patient during their visit and any new prescriptions provided to the patient are listed below.  Medications given during this visit Medications  clonazePAM (KLONOPIN) tablet 0.5 mg (has no administration in time range)  LORazepam (ATIVAN) injection 1 mg (1 mg Intravenous Given 02/18/19 0919)  sodium chloride 0.9 % bolus 1,000 mL (0 mLs Intravenous Stopped 02/18/19 1130)  LORazepam (ATIVAN) injection 1 mg (1 mg Intravenous Given 02/18/19 1302)     The patient appears reasonably screen and/or stabilized for discharge and I doubt any other medical condition or other [facility] requiring further screening, evaluation, or treatment in the ED at this time prior to discharge.    Final Clinical Impressions(s) / ED  Diagnoses   Final diagnoses:  Seizure [facility])    ED Discharge Orders         Ordered    clonazePAM (KLONOPIN) 0.5 MG tablet     02/18/19 [place], Amonda Brillhart, DO 02/18/19 1342

## 2019-02-18 NOTE — Consult Note (Addendum)
Neurology Consultation  Reason for Consult: Breakthrough seizure Referring Physician: Tyrone Nine   History is obtained from: Husband and wife  HPI: Joshua [name] is a 58 y.o. 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  Surgeries were done on 11/06/2012 and also 01/10/2019.  Prior to finding the meningioma per wife and husband patient had had 2 seizures which were left-sided including the leg and arm and described as simple partial.  After resection patient was placed on antiepileptic medications.  Currently he is on Keppra 1500 mg twice daily along with Dilantin 300 mg nightly and takes 0.25 mg Xanax 3 times daily.  Wife states that since the resection he has had seizures that are described as simple partial and marching.  Apparently they start in the leg and move up the left side of his body.  Today his seizure was different as it also affected his right leg for a period of time.  Wife states the seizure lasted about 30 to 40 minutes.  Due to the fact that she feels EMS takes a long time to get to her how she drove patient to the hospital.  Currently patient is not having any further seizures.  Wife also states, about 5 days ago he was taken off the Xanax by his oncologist.  There was also mention that she feels as though he is anxiety ridden, and at times possibly agitated.  She oftentimes gives him Xanax to help with the agitation.  She is not sure if it is the medication.  Patient is scheduled to have radiation and chemotherapy for his meningioma.  Patient has recently seen Dr. Mickeal Skinner who is his oncologist who has stated that he prefer to keep him on Keppra at current dose and Dilantin at current dose.  He would like to revisit the Keppra given his mood swings and irritability after radiation is complete.  Patient states that since resection he has been approximately weak bilaterally in the legs more on the left.  He is unable  to move his left toes.  He also has decreased sensation to cold  and light touch from his toes up to mid-shin   Work up that has been done: -CT head without contrast - Labs -Lorazepam 1 mg IV   ROS: A 14 point ROS was performed and is negative except as noted in the HPI.   Past Medical History:  Diagnosis Date  . Brain tumor ([place]) 02/20/2013   brain tumor removed in March 2014, Dr Donald Pore  . Dizziness   . Enlarged prostate   . Headache(784.0)    scattered  . Meningioma ([place])      Family History  Problem Relation Age of Onset  . Hypertension Mother   . Hypertension Father      Social History:   reports that he has been smoking cigarettes. He has never used smokeless tobacco. He reports current alcohol use of about 8.0 standard drinks of alcohol per week. He reports that he does not use drugs.  Medications No current facility-administered medications for this encounter.   Current Outpatient Medications:  .  ALPRAZolam (XANAX) 0.25 MG tablet, Take 0.25 mg by mouth 3 (three) times daily., Disp: , Rfl:  .  aspirin EC 81 MG EC tablet, Take 1 tablet (81 mg total) by mouth daily., Disp: 30 tablet, Rfl: 3 .  FIBER ADULT GUMMIES PO, Take 1 tablet by mouth daily., Disp: , Rfl:  .  levETIRAcetam (KEPPRA) 500 MG tablet, Take 3 tablets (1,500 mg total) by mouth 2 (two) times daily., Disp: 180 tablet, Rfl: 3 .  Melatonin 5 MG CHEW, Chew 3 tablets by mouth at bedtime as needed (sleep)., Disp: , Rfl:  .  naproxen sodium (ALEVE) 220 MG tablet, Take 440 mg by mouth as needed (pain or headache)., Disp: , Rfl:  .  phenytoin (DILANTIN) 300 MG ER capsule, Take 1 capsule (300 mg total) by mouth at bedtime., Disp: 30 capsule, Rfl: 3 .  vitamin C (ASCORBIC ACID) 500 MG tablet, Take 500 mg by mouth daily., Disp: , Rfl:  .  HYDROcodone-acetaminophen (NORCO/VICODIN) 5-325 MG tablet, Take 1-2 tablets by mouth every 4 (four) hours as needed for moderate pain. (Patient not taking: Reported on 02/18/2019),  Disp: 30 tablet, Rfl: 0   Exam: Current vital signs: BP 120/78   Pulse 74   Temp 98.4 F (36.9 C) (Oral)   Resp 17   Ht 5\' 9"  (1.753 m)   Wt 81.1 kg   SpO2 99%   BMI 26.42 kg/m  Vital signs in last 24 hours: Temp:  [98.4 F (36.9 C)] 98.4 F (36.9 C) (08/03 0918) Pulse Rate:  [72-84] 74 (08/03 1030) Resp:  [17-30] 17 (08/03 1030) BP: (111-150)/(71-109) 120/78 (08/03 1030) SpO2:  [97 %-99 %] 99 % (08/03 1030) Weight:  [81.1 kg] 81.1 kg (08/03 0914)  Physical Exam  Constitutional: Appears well-developed and well-nourished.  Psych: Affect appropriate to situation Eyes: No scleral injection HENT: No OP obstrucion Head: Normocephalic.  Notable healed incision across the top of parietal region bilaterally Cardiovascular: Normal rate and regular rhythm.  Respiratory: Effort normal, non-labored breathing GI: Soft.  No distension. There is no tenderness.  Skin: WDI  Neuro: Mental Status: Patient is awake, alert, oriented to person, place, month, year, and situation. Patient is able to give a clear and coherent history. No signs of aphasia or neglect Cranial Nerves: II: Visual Fields are full.  III,IV, VI: EOMI without ptosis or diploplia. Pupils equal, round and reactive to light V: Facial sensation is symmetric to temperature VII: Facial movement is symmetric.  VIII: hearing is intact to voice X: Palat elevates symmetrically XI: Shoulder shrug is symmetric. XII: tongue is midline without atrophy or fasciculations.  Motor: Tone is normal. Bulk is normal. 5/5 strength bilateral upper extremities, 4/5 left leg and difficulty dorsiflexing his ankle on the left.  Right leg is 5/5.  Bilateral legs are only able to lift straight leg good off bed approximately 4 inches.  He cannot lift bilateral legs to the point that he cannot take part in heel-to-shin. Sensory: Grossly intact in the upper extremities.  As noted he has decreased sensation to both light touch and temperature from  toes to mid calf.  Vibratory sensation is intact Deep Tendon Reflexes: 2+ and symmetric in the biceps knee jerk patient has 3+ bilaterally and 2+ at the ankles Plantars: Toes are downgoing bilaterally.  Cerebellar: FNF  intact bilaterally he is unable to take part in the heel-to-shin  Labs I have reviewed labs in epic and the results pertinent to this consultation are:   CBC    Component Value Date/Time   WBC 7.2 02/18/2019 0920   RBC 5.00 02/18/2019 0920   HGB 16.0 02/18/2019 0929   HCT 47.0 02/18/2019 0929   PLT 270 02/18/2019 0920   MCV 94.4 02/18/2019 0920   MCH 31.2 02/18/2019 0920   MCHC 33.1 02/18/2019 0920   RDW 12.2 02/18/2019 0920  LYMPHSABS 2.8 02/18/2019 0920   MONOABS 0.8 02/18/2019 0920   EOSABS 0.2 02/18/2019 0920   BASOSABS 0.1 02/18/2019 0920    CMP     Component Value Date/Time   NA 141 02/18/2019 0929   K 4.2 02/18/2019 0929   CL 104 02/18/2019 0920   CO2 24 02/18/2019 0920   GLUCOSE 132 (H) 02/18/2019 0920   BUN 9 02/18/2019 0920   CREATININE 0.80 02/18/2019 0928   CALCIUM 9.4 02/18/2019 0920   PROT 6.8 02/18/2019 0920   ALBUMIN 4.0 02/18/2019 0920   AST 29 02/18/2019 0920   ALT 41 02/18/2019 0920   ALKPHOS 96 02/18/2019 0920   BILITOT 0.4 02/18/2019 0920   GFRNONAA >60 02/18/2019 0920   GFRAA >60 02/18/2019 0920    Lipid Panel  No results found for: CHOL, TRIG, HDL, CHOLHDL, VLDL, LDLCALC, LDLDIRECT   Imaging I have reviewed the images obtained:  CT-scan of the brain-status post right posterior right para falcine meningioma removal.  On sagittal image, there is a questionable small amount of residual mass in this area.  There is postoperative edema and fluid in the area of recent surgical removal.  Elsewise brain parenchyma appears unremarkable.  No acute infarct or hemorrhage.  MRI examination of the brain-pending  Etta Quill PA-C [facility] [phone]  M-F  (9:00 am- 5:00 PM)  02/18/2019, 11:49 AM   I have seen  and evaluated the patient. I have reviewed the above note and made appropriate changes.    Assessment:  There is a 58 year old male status post meningioma resection and craniotomy with breakthrough seizure.  Currently he is on max dose of Keppra of 1500 mg twice daily along with Dilantin 300 mg nightly.  He discontinued xanax 0.25mg  TID a few days ago, and we are within the time frame where this could be contributing to a lower seizure threshold. I would favor a slower wean with a longer acting benzo.   Recommendations: -Seizure precautions -clonopin 0.5mg  daily x 1 week, then 0.25mg  daily, then stop.  - continue keppra and dilantin at current doses - dilantin level pending  Roland Rack, MD [facility] [phone]  If 7pm- 7am, please page neurology on call as listed in [place].

## 2019-02-18 NOTE — ED Triage Notes (Signed)
Patient brought in by family stating patient may have had a seizure this morning. Patient states he is also having shortness of breath today. Patient very anxious and states he has been taking her medication as prescribed. EDP and family at bedside.

## 2019-02-19 ENCOUNTER — Telehealth: Payer: Self-pay | Admitting: Internal Medicine

## 2019-02-19 ENCOUNTER — Other Ambulatory Visit: Payer: Self-pay | Admitting: Internal Medicine

## 2019-02-19 ENCOUNTER — Telehealth: Payer: Self-pay | Admitting: *Deleted

## 2019-02-19 DIAGNOSIS — C700 Malignant neoplasm of cerebral meninges: Secondary | ICD-10-CM | POA: Insufficient documentation

## 2019-02-19 DIAGNOSIS — Z510 Encounter for antineoplastic radiation therapy: Secondary | ICD-10-CM | POA: Insufficient documentation

## 2019-02-19 LAB — PHENYTOIN LEVEL, FREE AND TOTAL
Phenytoin, Free: 1.2 ug/mL (ref 1.0–2.0)
Phenytoin, Total: 15.6 ug/mL (ref 10.0–20.0)

## 2019-02-19 MED ORDER — LEVETIRACETAM 500 MG PO TABS: 2000.0000 mg | ORAL_TABLET | Freq: Two times a day (BID) | ORAL | 3 refills | Status: DC

## 2019-02-19 NOTE — Telephone Encounter (Signed)
Pt states he was in ED yesterday and they added clonazepam. He felt like he was about to have another seizure today- legs began shaking. Wants to know what Dr Mickeal Skinner suggest.  Dr Mickeal Skinner wants him to increase Keppra to 2000 mg (4 tablets) two times a day.   Pt verbalized understanding. Will see Dr Mickeal Skinner on Thursday

## 2019-02-19 NOTE — Telephone Encounter (Signed)
Scheduled appt per 8/04 sch message - pt aware of appt date and time   

## 2019-02-20 ENCOUNTER — Other Ambulatory Visit: Payer: Self-pay

## 2019-02-20 ENCOUNTER — Ambulatory Visit
Admission: RE | Admit: 2019-02-20 | Discharge: 2019-02-20 | Disposition: A | Discharge status: Home / Self Care | Payer: Medicaid Other | Source: Ambulatory Visit | Attending: Radiation Oncology | Admitting: Radiation Oncology

## 2019-02-20 DIAGNOSIS — Z510 Encounter for antineoplastic radiation therapy: Secondary | ICD-10-CM | POA: Diagnosis not present

## 2019-02-21 ENCOUNTER — Inpatient Hospital Stay: Payer: Medicaid Other | Attending: Internal Medicine | Admitting: Internal Medicine

## 2019-02-21 ENCOUNTER — Ambulatory Visit
Admission: RE | Admit: 2019-02-21 | Discharge: 2019-02-21 | Disposition: A | Discharge status: Home / Self Care | Payer: Medicaid Other | Source: Ambulatory Visit | Attending: Radiation Oncology | Admitting: Radiation Oncology

## 2019-02-21 ENCOUNTER — Other Ambulatory Visit: Payer: Self-pay

## 2019-02-21 ENCOUNTER — Telehealth: Payer: Self-pay | Admitting: Internal Medicine

## 2019-02-21 VITALS — BP 109/74 | HR 79 | Temp 98.3°F | Resp 17 | Ht 69.0 in | Wt 185.5 lb

## 2019-02-21 DIAGNOSIS — M7989 Other specified soft tissue disorders: Secondary | ICD-10-CM | POA: Diagnosis not present

## 2019-02-21 DIAGNOSIS — Z79899 Other long term (current) drug therapy: Secondary | ICD-10-CM | POA: Insufficient documentation

## 2019-02-21 DIAGNOSIS — G40409 Other generalized epilepsy and epileptic syndromes, not intractable, without status epilepticus: Secondary | ICD-10-CM | POA: Diagnosis not present

## 2019-02-21 DIAGNOSIS — R569 Unspecified convulsions: Secondary | ICD-10-CM | POA: Diagnosis not present

## 2019-02-21 DIAGNOSIS — Z510 Encounter for antineoplastic radiation therapy: Secondary | ICD-10-CM | POA: Diagnosis not present

## 2019-02-21 DIAGNOSIS — F17210 Nicotine dependence, cigarettes, uncomplicated: Secondary | ICD-10-CM | POA: Insufficient documentation

## 2019-02-21 DIAGNOSIS — D320 Benign neoplasm of cerebral meninges: Secondary | ICD-10-CM | POA: Diagnosis not present

## 2019-02-21 MED ORDER — LAMOTRIGINE 25 MG PO TABS: ORAL_TABLET | ORAL | 0 refills | Status: DC

## 2019-02-21 MED ORDER — LORAZEPAM 1 MG PO TABS: 1.0000 mg | ORAL_TABLET | Freq: Three times a day (TID) | ORAL | 0 refills | Status: DC | PRN

## 2019-02-21 NOTE — Telephone Encounter (Signed)
Scheduled appt per 8/6 los. °

## 2019-02-21 NOTE — Progress Notes (Signed)
[facility] at [place] [place], [place] [address] [phone]   Interval Evaluation  Date of Service: 02/21/19 Patient Name: Joshua [name] Patient MRN: [id number] Patient DOB: 10-Dec-1960 Provider: [name], MD  Identifying Statement:  Joshua [name] is a 58 y.o. 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 Vertell Limber (WHO I) 01/10/19: Repeat craniotomy, debulking resection by Dr. Vertell Limber after tumor recurrence, seizures.  Path demonstrates islands of anaplasia c/w grade II/III.  Interval History:  Joshua [name] presents today for follow up given recent breakthrough seizures.  He describes prolonged seizure episode, at least 30 minutes of "constant left leg shaking" which prompted ED visit on 02/18/19.  This was similar to prior admission for status epilepticus which prompted re-resection of progressive meningioma.  He was discharged with Klonopin taper, currently on 0.5mg  daily, scheduled to decrease to 0.25mg  on 8/10. Following discharge had seizure "aura" which is difficult to describe, but this did not progress into full seizure.  We asked him to increase Keppra to 2000mg  twice per day, no events since.  Still complains of irritability and mood lability.  Now s/p 2 sessions of IMRT for remaining tumor.    H+P (02/05/19) Patient presents to review clinical course and care for his meningioma.  Initially he presented in 2014 with headache syndrome, was found to have tumor on imaging which was resected by Dr. Vertell Limber.  The patient was lost to follow up for ~5 years until he developed seizures, described as "twitching of left leg spreading to arm" this past month.  He required loads of Keppra and Dilantin to break events.  MRI demonstrated significant regrowth of the mass, and repeat craniotomy was performed on 01/10/19.  Small amount of residual tumor  was visible on post-operative MRI.  Since surgery he has continued to experience numbness and clumsiness of his left leg, requiring cane for ambulation.  He is still pending home physical therapy evaluation.  He has had "small" seizures, consisting of few seconds of shaking of left leg, occurring less than daily.  Continues on Keppra and Dilantin.    Medications: Current Outpatient Medications on File Prior to Visit  Medication Sig Dispense Refill  . ALPRAZolam (XANAX) 0.25 MG tablet Take 0.25 mg by mouth 3 (three) times daily.    Marland Kitchen aspirin EC 81 MG EC tablet Take 1 tablet (81 mg total) by mouth daily. 30 tablet 3  . clonazePAM (KLONOPIN) 0.5 MG tablet Take 1 tablet/day for the next 7 days.  The next week then take 0.5 tablets a day for the next 7 days. 11 tablet 0  . FIBER ADULT GUMMIES PO Take 1 tablet by mouth daily.    Marland Kitchen HYDROcodone-acetaminophen (NORCO/VICODIN) 5-325 MG tablet Take 1-2 tablets by mouth every 4 (four) hours as needed for moderate pain. (Patient not taking: Reported on 02/18/2019) 30 tablet 0  . levETIRAcetam (KEPPRA) 500 MG tablet Take 4 tablets (2,000 mg total) by mouth 2 (two) times daily. 180 tablet 3  . Melatonin 5 MG CHEW Chew 3 tablets by mouth at bedtime as needed (sleep).    . naproxen sodium (ALEVE) 220 MG tablet Take 440 mg by mouth as needed (pain or headache).    . phenytoin (DILANTIN) 300 MG ER capsule Take 1 capsule (300 mg total) by mouth at bedtime. 30 capsule 3  . vitamin C (ASCORBIC ACID) 500 MG tablet Take 500 mg  by mouth daily.     No current facility-administered medications on file prior to visit.     Allergies: No Known Allergies Past Medical History:  Past Medical History:  Diagnosis Date  . Brain tumor ([place]) 02/20/2013   brain tumor removed in March 2014, Dr Donald Pore  . Dizziness   . Enlarged prostate   . Headache(784.0)    scattered  . Meningioma [facility])    Past Surgical History:  Past Surgical History:  Procedure Laterality Date  .  APPLICATION OF CRANIAL NAVIGATION N/A 01/10/2019   Procedure: APPLICATION OF CRANIAL NAVIGATION;  Surgeon: Erline Levine, MD;  Location: [facility];  Service: Neurosurgery;  Laterality: N/A;  . CATARACT EXTRACTION Right   . CRANIOTOMY Right 11/06/2012   Procedure: CRANIOTOMY TUMOR EXCISION;  Surgeon: Erline Levine, MD;  Location: [facility];  Service: Neurosurgery;  Laterality: Right;  Right Parasagittal craniotomy for meningioma with [facility]  . CRANIOTOMY Right 01/10/2019   Procedure: Right Parasagittal Craniotomy for Tumor;  Surgeon: Erline Levine, MD;  Location: [facility];  Service: Neurosurgery;  Laterality: Right;  Right parasagittal craniotomy for tumor  . ESOPHAGOGASTRODUODENOSCOPY    . right knee arthroscopy     Social History:  Social History   Socioeconomic History  . Marital status: Married    Spouse name: Not on file  . Number of children: Not on file  . Years of education: Not on file  . Highest education level: Not on file  Occupational History  . Not on file  Social Needs  . Financial resource strain: Not on file  . Food insecurity    Worry: Not on file    Inability: Not on file  . Transportation needs    Medical: No    Non-medical: No  Tobacco Use  . Smoking status: Current Some Day Smoker    Types: Cigarettes  . Smokeless tobacco: Never Used  . Tobacco comment: hasn't smoked in a week  Substance and Sexual Activity  . Alcohol use: Yes    Alcohol/week: 8.0 standard drinks    Types: 6 Cans of beer, 2 Shots of liquor per week    Comment: occasionally  . Drug use: No  . Sexual activity: Yes  Lifestyle  . Physical activity    Days per week: Not on file    Minutes per session: Not on file  . Stress: Not on file  Relationships  . Social Herbalist on phone: Not on file    Gets together: Not on file    Attends religious service: Not on file    Active member of club or organization: Not on file    Attends meetings of clubs or organizations: Not on file     Relationship status: Not on file  . Intimate partner violence    Fear of current or ex partner: Not on file    Emotionally abused: Not on file    Physically abused: Not on file    Forced sexual activity: Not on file  Other Topics Concern  . Not on file  Social History Narrative  . Not on file   Family History:  Family History  Problem Relation Age of Onset  . Hypertension Mother   . Hypertension Father     Review of Systems: Constitutional: Denies fevers, chills or abnormal weight loss Eyes: Denies blurriness of vision Ears, nose, mouth, throat, and face: Denies mucositis or sore throat Respiratory: Denies cough, dyspnea or wheezes Cardiovascular: Denies palpitation, chest discomfort or lower extremity  swelling Gastrointestinal:  Denies nausea, constipation, diarrhea GU: Denies dysuria or incontinence Skin: Denies abnormal skin rashes Neurological: Per HPI Musculoskeletal: Denies joint pain, back or neck discomfort. No decrease in ROM Behavioral/Psych: Denies anxiety, disturbance in thought content, and mood instability  Physical Exam: Vitals:   02/21/19 0846  BP: 109/74  Pulse: 79  Resp: 17  Temp: 98.3 F (36.8 C)  SpO2: 99%   KPS: 80. General: Alert, cooperative, pleasant, in no acute distress Head: Craniotomy scar noted, dry and intact. EENT: No conjunctival injection or scleral icterus. Oral mucosa moist Lungs: Resp effort normal Cardiac: Regular rate and rhythm Abdomen: Soft, non-distended abdomen Skin: No rashes cyanosis or petechiae. Extremities: No clubbing or edema  Neurologic Exam: Mental Status: Awake, alert, attentive to examiner. Oriented to self and environment. Language is fluent with intact comprehension.  Cranial Nerves: Visual acuity is grossly normal. Visual fields are full. Extra-ocular movements intact. No ptosis. Face is symmetric, tongue midline. Motor: Tone and bulk are normal. Power is full in both arms and legs. Reflexes are symmetric,  no pathologic reflexes present. Impaired heel to shin left leg Sensory: Diminished to light touch and temp left lower leg Gait: Cane assisted  Labs: I have reviewed the data as listed    Component Value Date/Time   NA 141 02/18/2019 0929   K 4.2 02/18/2019 0929   CL 104 02/18/2019 0920   CO2 24 02/18/2019 0920   GLUCOSE 132 (H) 02/18/2019 0920   BUN 9 02/18/2019 0920   CREATININE 0.80 02/18/2019 0928   CALCIUM 9.4 02/18/2019 0920   PROT 6.8 02/18/2019 0920   ALBUMIN 4.0 02/18/2019 0920   AST 29 02/18/2019 0920   ALT 41 02/18/2019 0920   ALKPHOS 96 02/18/2019 0920   BILITOT 0.4 02/18/2019 0920   GFRNONAA >60 02/18/2019 0920   GFRAA >60 02/18/2019 0920   Lab Results  Component Value Date   WBC 7.2 02/18/2019   NEUTROABS 3.3 02/18/2019   HGB 16.0 02/18/2019   HCT 47.0 02/18/2019   MCV 94.4 02/18/2019   PLT 270 02/18/2019    Imaging:  Ct Head Wo Contrast  Result Date: 02/18/2019 CLINICAL DATA:  Grand mal seizure. Status post recent craniotomy for removal of meningioma EXAM: CT HEAD WITHOUT CONTRAST TECHNIQUE: Contiguous axial images were obtained from the base of the skull through the vertex without intravenous contrast. COMPARISON:  January 09, 2019. FINDINGS: Brain: Ventricles are normal in size and configuration. There has been recent removal of a posterior right parafalcine meningioma with mild presumed postoperative edema and fluid in the area of previous surgery. On the sagittal images, there is an area of increased attenuation measuring 1.3 x 1.0 cm which potentially may represent a small amount of residual mass along the posterior aspect of previous meningioma. No other findings elsewhere suggesting potential mass. No acute hemorrhage evident. No midline shift or extra-axial fluid collection. No evident acute infarct. Vascular: There is no hyperdense vessel. No vascular calcification evident. Skull: There is postoperative craniotomy change in the vertex regions, anteriorly and  posteriorly. Bony calvarium otherwise appears intact. There is soft tissue edema in the scalp at the site of recent craniotomy. Sinuses/Orbits: There is mucosal thickening in several ethmoid air cells. Other visualized paranasal sinuses are clear. Orbits appear symmetric bilaterally. Other: Mastoid air cells are clear. IMPRESSION: Status post right posterior right parafalcine meningioma removal. On sagittal image, there is a questionable small amount of residual mass in this area. There is postoperative edema and fluid in the area  of recent surgical removal. Elsewhere brain parenchyma appears unremarkable. No acute infarct or hemorrhagic focus. Postoperative changes noted in the scalp of the vertex. There is mucosal thickening in several ethmoid air cells. Comment: MR with contrast may be helpful to assess for potential residual meningioma tissue in the postoperative area. Electronically Signed   By: Lowella Grip III M.D.   On: 02/18/2019 09:52     Assessment/Plan 1. Meningioma, recurrent of brain ([place])  2. Seizures [facility])  Mr. Kutzer presents today after breakthrough seizures, and is otherwise clinically stable.   We recommended the following: -Continue Keppra 2000mg  BID and Dilantin 300mg  HS.  -Complete 14 day Klonopin taper as scheduled -Initiate therapy with Lamictal, 25mg  daily x7 days starting 8/10, then 50mg  daily starting 8/17 -Provided script for Ativan 1-2mg  PO prn breathrough/prolonged seizures -Will proceed very cautiously with long term plan to wean or transition Keppra due to mood side effects   We appreciate the opportunity to participate in the care of Joshua [name].  He will follow up with [place] in 2 weeks to proceed with planned adjustments to this regimen.  All questions were answered. The patient knows to call the clinic with any problems, questions or concerns. No barriers to learning were detected.  He will follow up with MRI brain 3 months following radiation  therapy.  The total time spent in the encounter was 40 minutes and more than 50% was on counseling and review of test results   [name], MD Medical Director of Neuro-Oncology [facility] at [place] 02/21/19 8:52 AM

## 2019-02-22 ENCOUNTER — Ambulatory Visit
Admission: RE | Admit: 2019-02-22 | Discharge: 2019-02-22 | Disposition: A | Discharge status: Home / Self Care | Payer: Medicaid Other | Source: Ambulatory Visit | Attending: Radiation Oncology | Admitting: Radiation Oncology

## 2019-02-22 DIAGNOSIS — Z510 Encounter for antineoplastic radiation therapy: Secondary | ICD-10-CM | POA: Diagnosis not present

## 2019-02-22 DIAGNOSIS — D320 Benign neoplasm of cerebral meninges: Secondary | ICD-10-CM

## 2019-02-22 MED ORDER — SONAFINE EX EMUL
1.0000 "application " | Freq: Two times a day (BID) | CUTANEOUS | Status: DC
  Administered 2019-02-22: 1 via TOPICAL

## 2019-02-25 ENCOUNTER — Telehealth: Payer: Self-pay | Admitting: *Deleted

## 2019-02-25 ENCOUNTER — Ambulatory Visit: Payer: Medicaid Other | Attending: Family Medicine | Admitting: Family Medicine

## 2019-02-25 ENCOUNTER — Ambulatory Visit
Admission: RE | Admit: 2019-02-25 | Discharge: 2019-02-25 | Disposition: A | Discharge status: Home / Self Care | Payer: Medicaid Other | Source: Ambulatory Visit | Attending: Radiation Oncology | Admitting: Radiation Oncology

## 2019-02-25 ENCOUNTER — Other Ambulatory Visit: Payer: Self-pay

## 2019-02-25 ENCOUNTER — Encounter: Payer: Self-pay | Admitting: Family Medicine

## 2019-02-25 DIAGNOSIS — D320 Benign neoplasm of cerebral meninges: Secondary | ICD-10-CM

## 2019-02-25 DIAGNOSIS — G629 Polyneuropathy, unspecified: Secondary | ICD-10-CM | POA: Diagnosis not present

## 2019-02-25 DIAGNOSIS — Z510 Encounter for antineoplastic radiation therapy: Secondary | ICD-10-CM | POA: Diagnosis not present

## 2019-02-25 MED ORDER — DULOXETINE HCL 60 MG PO CPEP: 60.0000 mg | ORAL_CAPSULE | Freq: Every day | ORAL | 3 refills | Status: DC

## 2019-02-25 MED ORDER — TRIAMCINOLONE ACETONIDE 0.1 % EX CREA: 1.0000 "application " | TOPICAL_CREAM | Freq: Two times a day (BID) | CUTANEOUS | 1 refills | Status: DC

## 2019-02-25 NOTE — Progress Notes (Signed)
Virtual Visit via Telephone Note  I connected with Joshua [name], on 02/25/2019 at 5:56 PM by telephone due to the COVID-19 pandemic and verified that I am speaking with the correct person using two identifiers.   Consent: I discussed the limitations, risks, security and privacy concerns of performing an evaluation and management service by telephone and the availability of in person appointments. I also discussed with the patient that there may be a patient responsible charge related to this service. The patient expressed understanding and agreed to proceed.   Location of Patient: Home  Location of Provider: Clinic   Persons participating in Telemedicine visit: Cylas Falzone Farrington-CMA Dr. Bennie Pierini - spouse    History of Present Illness: 58 year old right-handed male male with a history of meningioma status post resection in 2014 hospitalized at [facility] from 01/03/2019 through 01/14/2019 for recurrent meningioma status post resection here for a follow-up visit. He continues to have left lower extremity paresthesia, left leg pain and intermittent headache.  He has had his staples removed by his neurosurgeon and has had no recent falls.  At his last visit he was ambulating with a walker but now uses a cane. He had an ED visit last week for a seizure for which he received Klonopin and Keppra was increased.  He was seen for follow-up by medical oncology and Lamictal added to his regimen, Klonopin tapered off and Ativan prescribed for breakthrough seizures. Since his last visit he has commenced radiation for residual tumor.   Past Medical History:  Diagnosis Date  . Brain tumor ([place]) 02/20/2013   brain tumor removed in March 2014, Dr Donald Pore  . Dizziness   . Enlarged prostate   . Headache(784.0)    scattered  . Meningioma ([place])    No Known Allergies  Current Outpatient Medications on File Prior to Visit  Medication Sig Dispense Refill  .  aspirin EC 81 MG EC tablet Take 1 tablet (81 mg total) by mouth daily. 30 tablet 3  . clonazePAM (KLONOPIN) 0.5 MG tablet Take 1 tablet/day for the next 7 days.  The next week then take 0.5 tablets a day for the next 7 days. 11 tablet 0  . FIBER ADULT GUMMIES PO Take 1 tablet by mouth daily.    Marland Kitchen lamoTRIgine (LAMICTAL) 25 MG tablet Starting 02/25/19, take 25mg  by mouth daily for 7 days.  Then take 50mg  by mouth daily x7 days. 21 tablet 0  . levETIRAcetam (KEPPRA) 500 MG tablet Take 4 tablets (2,000 mg total) by mouth 2 (two) times daily. 180 tablet 3  . LORazepam (ATIVAN) 1 MG tablet Take 1 tablet (1 mg total) by mouth every 8 (eight) hours as needed for seizure (for breakthrough seizures). 12 tablet 0  . naproxen sodium (ALEVE) 220 MG tablet Take 440 mg by mouth as needed (pain or headache).    . phenytoin (DILANTIN) 300 MG ER capsule Take 1 capsule (300 mg total) by mouth at bedtime. 30 capsule 3  . vitamin C (ASCORBIC ACID) 500 MG tablet Take 500 mg by mouth daily.    Marland Kitchen ALPRAZolam (XANAX) 0.25 MG tablet Take 0.25 mg by mouth 3 (three) times daily.    Marland Kitchen HYDROcodone-acetaminophen (NORCO/VICODIN) 5-325 MG tablet Take 1-2 tablets by mouth every 4 (four) hours as needed for moderate pain. (Patient not taking: Reported on 02/18/2019) 30 tablet 0  . Melatonin 5 MG CHEW Chew 3 tablets by mouth at bedtime as needed (sleep).     No current  facility-administered medications on file prior to visit.     Observations/Objective: Awake, alert, oriented x3 Not in acute distress  Assessment and Plan: 1. Recurrent meningioma of the brain [facility]) Status post resection in 2014 and again in 12/2018 Continue Keppra, Dilantin, Lamictal for seizures Continue radiation Follow-up with medical oncology and neurosurgery  2. Neuropathy Commenced on gabapentin   Follow Up Instructions: Return in about 3 months (around 05/28/2019) for Medical conditions.    I discussed the assessment and treatment plan with the  patient. The patient was provided an opportunity to ask questions and all were answered. The patient agreed with the plan and demonstrated an understanding of the instructions.   The patient was advised to call back or seek an in-person evaluation if the symptoms worsen or if the condition fails to improve as anticipated.     I provided 11 minutes total of non-face-to-face time during this encounter including median intraservice time, reviewing previous notes, labs, imaging, medications, management and patient verbalized understanding.     Charlott Rakes, MD, FAAFP. [facility] and [place], [place]   02/25/2019, 5:56 PM

## 2019-02-25 NOTE — Progress Notes (Signed)
Patient has been called and DOB has been verified. Patient has been screened and transferred to PCP to start phone visit.     

## 2019-02-25 NOTE — Telephone Encounter (Signed)
Patient called to clarify medication instructions with his new seizure med and if she should taper any of the other seizure meds (Keppra).  Per Dr. Mickeal Skinner note, continue Keppra, Dilantin, add in Lamictal as instructed and taper Klonopin as previous instructed.  Patient states understanding.

## 2019-02-26 ENCOUNTER — Ambulatory Visit
Admission: RE | Admit: 2019-02-26 | Discharge: 2019-02-26 | Disposition: A | Discharge status: Home / Self Care | Payer: Medicaid Other | Source: Ambulatory Visit | Attending: Radiation Oncology | Admitting: Radiation Oncology

## 2019-02-26 ENCOUNTER — Other Ambulatory Visit: Payer: Self-pay

## 2019-02-26 DIAGNOSIS — Z510 Encounter for antineoplastic radiation therapy: Secondary | ICD-10-CM | POA: Diagnosis not present

## 2019-02-27 ENCOUNTER — Ambulatory Visit
Admission: RE | Admit: 2019-02-27 | Discharge: 2019-02-27 | Disposition: A | Discharge status: Home / Self Care | Payer: Medicaid Other | Source: Ambulatory Visit | Attending: Radiation Oncology | Admitting: Radiation Oncology

## 2019-02-27 ENCOUNTER — Other Ambulatory Visit: Payer: Self-pay

## 2019-02-27 DIAGNOSIS — Z510 Encounter for antineoplastic radiation therapy: Secondary | ICD-10-CM | POA: Diagnosis not present

## 2019-02-28 ENCOUNTER — Other Ambulatory Visit: Payer: Self-pay

## 2019-02-28 ENCOUNTER — Ambulatory Visit
Admission: RE | Admit: 2019-02-28 | Discharge: 2019-02-28 | Disposition: A | Discharge status: Home / Self Care | Payer: Medicaid Other | Source: Ambulatory Visit | Attending: Radiation Oncology | Admitting: Radiation Oncology

## 2019-02-28 DIAGNOSIS — Z510 Encounter for antineoplastic radiation therapy: Secondary | ICD-10-CM | POA: Diagnosis not present

## 2019-03-01 ENCOUNTER — Ambulatory Visit
Admission: RE | Admit: 2019-03-01 | Discharge: 2019-03-01 | Disposition: A | Discharge status: Home / Self Care | Payer: Medicaid Other | Source: Ambulatory Visit | Attending: Radiation Oncology | Admitting: Radiation Oncology

## 2019-03-01 ENCOUNTER — Other Ambulatory Visit: Payer: Self-pay

## 2019-03-01 DIAGNOSIS — Z510 Encounter for antineoplastic radiation therapy: Secondary | ICD-10-CM | POA: Diagnosis not present

## 2019-03-04 ENCOUNTER — Other Ambulatory Visit: Payer: Self-pay

## 2019-03-04 ENCOUNTER — Ambulatory Visit
Admission: RE | Admit: 2019-03-04 | Discharge: 2019-03-04 | Disposition: A | Discharge status: Home / Self Care | Payer: Medicaid Other | Source: Ambulatory Visit | Attending: Radiation Oncology | Admitting: Radiation Oncology

## 2019-03-04 ENCOUNTER — Telehealth: Payer: Self-pay | Admitting: *Deleted

## 2019-03-04 DIAGNOSIS — Z510 Encounter for antineoplastic radiation therapy: Secondary | ICD-10-CM | POA: Diagnosis not present

## 2019-03-04 NOTE — Telephone Encounter (Signed)
Patient called to see if he could get the flu vaccine this season.  Per Dr. Mickeal Skinner ok to proceed.

## 2019-03-05 ENCOUNTER — Ambulatory Visit
Admission: RE | Admit: 2019-03-05 | Discharge: 2019-03-05 | Disposition: A | Discharge status: Home / Self Care | Payer: Medicaid Other | Source: Ambulatory Visit | Attending: Radiation Oncology | Admitting: Radiation Oncology

## 2019-03-05 ENCOUNTER — Telehealth: Payer: Self-pay | Admitting: *Deleted

## 2019-03-05 ENCOUNTER — Other Ambulatory Visit: Payer: Self-pay

## 2019-03-05 ENCOUNTER — Ambulatory Visit: Payer: MEDICAID | Admitting: Neurology

## 2019-03-05 DIAGNOSIS — Z510 Encounter for antineoplastic radiation therapy: Secondary | ICD-10-CM | POA: Diagnosis not present

## 2019-03-05 NOTE — Telephone Encounter (Signed)
Patient called to report his recent use of Ativan for breakthrough seizure.  He reports it was helpful in decreasing the severity of the seizure.

## 2019-03-06 ENCOUNTER — Ambulatory Visit
Admission: RE | Admit: 2019-03-06 | Discharge: 2019-03-06 | Disposition: A | Discharge status: Home / Self Care | Payer: Medicaid Other | Source: Ambulatory Visit | Attending: Radiation Oncology | Admitting: Radiation Oncology

## 2019-03-06 ENCOUNTER — Other Ambulatory Visit: Payer: Self-pay

## 2019-03-06 DIAGNOSIS — Z510 Encounter for antineoplastic radiation therapy: Secondary | ICD-10-CM | POA: Diagnosis not present

## 2019-03-07 ENCOUNTER — Inpatient Hospital Stay (HOSPITAL_BASED_OUTPATIENT_CLINIC_OR_DEPARTMENT_OTHER): Payer: Medicaid Other | Admitting: Internal Medicine

## 2019-03-07 ENCOUNTER — Other Ambulatory Visit: Payer: Self-pay

## 2019-03-07 ENCOUNTER — Telehealth: Payer: Self-pay | Admitting: Internal Medicine

## 2019-03-07 ENCOUNTER — Ambulatory Visit
Admission: RE | Admit: 2019-03-07 | Discharge: 2019-03-07 | Disposition: A | Discharge status: Home / Self Care | Payer: Medicaid Other | Source: Ambulatory Visit | Attending: Radiation Oncology | Admitting: Radiation Oncology

## 2019-03-07 VITALS — BP 130/91 | HR 77 | Temp 98.7°F | Resp 18 | Ht 69.0 in | Wt 187.2 lb

## 2019-03-07 DIAGNOSIS — D320 Benign neoplasm of cerebral meninges: Secondary | ICD-10-CM

## 2019-03-07 DIAGNOSIS — R569 Unspecified convulsions: Secondary | ICD-10-CM

## 2019-03-07 DIAGNOSIS — C700 Malignant neoplasm of cerebral meninges: Secondary | ICD-10-CM

## 2019-03-07 DIAGNOSIS — Z510 Encounter for antineoplastic radiation therapy: Secondary | ICD-10-CM | POA: Diagnosis not present

## 2019-03-07 HISTORY — DX: Malignant neoplasm of cerebral meninges: C70.0

## 2019-03-07 MED ORDER — LEVETIRACETAM 500 MG PO TABS: 1000.0000 mg | ORAL_TABLET | Freq: Two times a day (BID) | ORAL | 3 refills | Status: DC

## 2019-03-07 MED ORDER — LAMOTRIGINE 100 MG PO TABS: ORAL_TABLET | ORAL | 3 refills | Status: DC

## 2019-03-07 MED ORDER — DEXAMETHASONE 2 MG PO TABS: 2.0000 mg | ORAL_TABLET | Freq: Every day | ORAL | 0 refills | Status: DC

## 2019-03-07 NOTE — Progress Notes (Signed)
Joshua [name] at [place], [place] [address] [phone]   Interval Evaluation  Date of Service: 03/07/19 Patient Name: Joshua [name] Patient MRN: [id number] Patient DOB: [birth year] Provider: [name], MD  Identifying Statement:  DARE TULIP is a 58 y.o. 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 Vertell Limber (WHO I) 01/10/19: Repeat craniotomy, debulking resection by Dr. Vertell Limber after tumor recurrence, seizures.  Path demonstrates islands of anaplasia c/w grade II/III.  Interval History:  [name] presents today for follow up given recent breakthrough seizures.  He did experience one breakthrough stereotypical seizure event this past week.  He took 2mg  ativan which caused cessation of shaking after several minutes.  Left leg and foot numbness and incoordination are more noticeable since start of radiation. Still complains of irritability and mood lability.  Now s/p 2 sessions of IMRT for remaining tumor.    H+P (02/05/19) Patient presents to review clinical course and care for his meningioma.  Initially he presented in 2014 with headache syndrome, was found to have tumor on imaging which was resected by Dr. Vertell Limber.  The patient was lost to follow up for ~5 years until he developed seizures, described as "twitching of left leg spreading to arm" this past month.  He required loads of Keppra and Dilantin to break events.  MRI demonstrated significant regrowth of the mass, and repeat craniotomy was performed on 01/10/19.  Small amount of residual tumor was visible on post-operative MRI.  Since surgery he has continued to experience numbness and clumsiness of his left leg, requiring cane for ambulation.  He is still pending home physical therapy evaluation.  He has had "small" seizures, consisting of few seconds of shaking of left leg,  occurring less than daily.  Continues on Keppra and Dilantin.    Medications: Current Outpatient Medications on File Prior to Visit  Medication Sig Dispense Refill  . aspirin EC 81 MG EC tablet Take 1 tablet (81 mg total) by mouth daily. 30 tablet 3  . FIBER ADULT GUMMIES PO Take 1 tablet by mouth daily.    Marland Kitchen lamoTRIgine (LAMICTAL) 25 MG tablet Starting 02/25/19, take 25mg  by mouth daily for 7 days.  Then take 50mg  by mouth daily x7 days. 21 tablet 0  . levETIRAcetam (KEPPRA) 500 MG tablet Take 4 tablets (2,000 mg total) by mouth 2 (two) times daily. 180 tablet 3  . LORazepam (ATIVAN) 1 MG tablet Take 1 tablet (1 mg total) by mouth every 8 (eight) hours as needed for seizure (for breakthrough seizures). 12 tablet 0  . Melatonin 5 MG CHEW Chew 3 tablets by mouth at bedtime as needed (sleep).    . naproxen sodium (ALEVE) 220 MG tablet Take 440 mg by mouth as needed (pain or headache).    . phenytoin (DILANTIN) 300 MG ER capsule Take 1 capsule (300 mg total) by mouth at bedtime. 30 capsule 3  . triamcinolone cream (KENALOG) 0.1 % Apply 1 application topically 2 (two) times daily. 30 g 1  . vitamin C (ASCORBIC ACID) 500 MG tablet Take 500 mg by mouth daily.     No current facility-administered medications on file prior to visit.     Allergies: No Known Allergies Past Medical History:  Past Medical History:  Diagnosis Date  . Brain tumor ([place]) 02/20/2013   brain tumor removed in March 2014, Dr Donald Pore  . Dizziness   .  Enlarged prostate   . Headache(784.0)    scattered  . Meningioma [facility])    Past Surgical History:  Past Surgical History:  Procedure Laterality Date  . APPLICATION OF CRANIAL NAVIGATION N/A 01/10/2019   Procedure: APPLICATION OF CRANIAL NAVIGATION;  Surgeon: Erline Levine, MD;  Location: [place];  Service: Neurosurgery;  Laterality: N/A;  . CATARACT EXTRACTION Right   . CRANIOTOMY Right 11/06/2012   Procedure: CRANIOTOMY TUMOR EXCISION;  Surgeon: Erline Levine, MD;  Location:  [place] NEURO ORS;  Service: Neurosurgery;  Laterality: Right;  Right Parasagittal craniotomy for meningioma with [place]  . CRANIOTOMY Right 01/10/2019   Procedure: Right Parasagittal Craniotomy for Tumor;  Surgeon: Erline Levine, MD;  Location: [place];  Service: Neurosurgery;  Laterality: Right;  Right parasagittal craniotomy for tumor  . ESOPHAGOGASTRODUODENOSCOPY    . right knee arthroscopy     Social History:  Social History   Socioeconomic History  . Marital status: Married    Spouse name: Not on file  . Number of children: Not on file  . Years of education: Not on file  . Highest education level: Not on file  Occupational History  . Not on file  Social Needs  . Financial resource strain: Not on file  . Food insecurity    Worry: Not on file    Inability: Not on file  . Transportation needs    Medical: No    Non-medical: No  Tobacco Use  . Smoking status: Current Some Day Smoker    Types: Cigarettes  . Smokeless tobacco: Never Used  . Tobacco comment: hasn't smoked in a week  Substance and Sexual Activity  . Alcohol use: Yes    Alcohol/week: 8.0 standard drinks    Types: 6 Cans of beer, 2 Shots of liquor per week    Comment: occasionally  . Drug use: No  . Sexual activity: Yes  Lifestyle  . Physical activity    Days per week: Not on file    Minutes per session: Not on file  . Stress: Not on file  Relationships  . Social Herbalist on phone: Not on file    Gets together: Not on file    Attends religious service: Not on file    Active member of club or organization: Not on file    Attends meetings of clubs or organizations: Not on file    Relationship status: Not on file  . Intimate partner violence    Fear of current or ex partner: Not on file    Emotionally abused: Not on file    Physically abused: Not on file    Forced sexual activity: Not on file  Other Topics Concern  . Not on file  Social History Narrative  . Not on file   Family History:   Family History  Problem Relation Age of Onset  . Hypertension Mother   . Hypertension Father     Review of Systems: Constitutional: Denies fevers, chills or abnormal weight loss Eyes: Denies blurriness of vision Ears, nose, mouth, throat, and face: Denies mucositis or sore throat Respiratory: Denies cough, dyspnea or wheezes Cardiovascular: Denies palpitation, chest discomfort or lower extremity swelling Gastrointestinal:  Denies nausea, constipation, diarrhea GU: Denies dysuria or incontinence Skin: Denies abnormal skin rashes Neurological: Per HPI Musculoskeletal: Denies joint pain, back or neck discomfort. No decrease in ROM Behavioral/Psych: Denies anxiety, disturbance in thought content, and mood instability  Physical Exam: Vitals:   03/07/19 0831  BP: (!) 130/91  Pulse:  77  Resp: 18  Temp: 98.7 F (37.1 C)  SpO2: 99%   KPS: 80. General: Alert, cooperative, pleasant, in no acute distress Head: Craniotomy scar noted, dry and intact. EENT: No conjunctival injection or scleral icterus. Oral mucosa moist Lungs: Resp effort normal Cardiac: Regular rate and rhythm Abdomen: Soft, non-distended abdomen Skin: No rashes cyanosis or petechiae. Extremities: No clubbing or edema  Neurologic Exam: Mental Status: Awake, alert, attentive to examiner. Oriented to self and environment. Language is fluent with intact comprehension.  Cranial Nerves: Visual acuity is grossly normal. Visual fields are full. Extra-ocular movements intact. No ptosis. Face is symmetric, tongue midline. Motor: Tone and bulk are normal. Power is full in both arms and legs. Reflexes are symmetric, no pathologic reflexes present. Impaired heel to shin left leg Sensory: Diminished to light touch and temp left lower leg Gait: Cane assisted  Labs: I have reviewed the data as listed    Component Value Date/Time   NA 141 02/18/2019 0929   K 4.2 02/18/2019 0929   CL 104 02/18/2019 0920   CO2 24 02/18/2019  0920   GLUCOSE 132 (H) 02/18/2019 0920   BUN 9 02/18/2019 0920   CREATININE 0.80 02/18/2019 0928   CALCIUM 9.4 02/18/2019 0920   PROT 6.8 02/18/2019 0920   ALBUMIN 4.0 02/18/2019 0920   AST 29 02/18/2019 0920   ALT 41 02/18/2019 0920   ALKPHOS 96 02/18/2019 0920   BILITOT 0.4 02/18/2019 0920   GFRNONAA >60 02/18/2019 0920   GFRAA >60 02/18/2019 0920   Lab Results  Component Value Date   WBC 7.2 02/18/2019   NEUTROABS 3.3 02/18/2019   HGB 16.0 02/18/2019   HCT 47.0 02/18/2019   MCV 94.4 02/18/2019   PLT 270 02/18/2019    Imaging:  Ct Head Wo Contrast  Result Date: 02/18/2019 CLINICAL DATA:  Grand mal seizure. Status post recent craniotomy for removal of meningioma EXAM: CT HEAD WITHOUT CONTRAST TECHNIQUE: Contiguous axial images were obtained from the base of the skull through the vertex without intravenous contrast. COMPARISON:  January 09, 2019. FINDINGS: Brain: Ventricles are normal in size and configuration. There has been recent removal of a posterior right parafalcine meningioma with mild presumed postoperative edema and fluid in the area of previous surgery. On the sagittal images, there is an area of increased attenuation measuring 1.3 x 1.0 cm which potentially may represent a small amount of residual mass along the posterior aspect of previous meningioma. No other findings elsewhere suggesting potential mass. No acute hemorrhage evident. No midline shift or extra-axial fluid collection. No evident acute infarct. Vascular: There is no hyperdense vessel. No vascular calcification evident. Skull: There is postoperative craniotomy change in the vertex regions, anteriorly and posteriorly. Bony calvarium otherwise appears intact. There is soft tissue edema in the scalp at the site of recent craniotomy. Sinuses/Orbits: There is mucosal thickening in several ethmoid air cells. Other visualized paranasal sinuses are clear. Orbits appear symmetric bilaterally. Other: Mastoid air cells are  clear. IMPRESSION: Status post right posterior right parafalcine meningioma removal. On sagittal image, there is a questionable small amount of residual mass in this area. There is postoperative edema and fluid in the area of recent surgical removal. Elsewhere brain parenchyma appears unremarkable. No acute infarct or hemorrhagic focus. Postoperative changes noted in the scalp of the vertex. There is mucosal thickening in several ethmoid air cells. Comment: MR with contrast may be helpful to assess for potential residual meningioma tissue in the postoperative area. Electronically Signed  By: Lowella Grip III M.D.   On: 02/18/2019 09:52     Assessment/Plan 1. Meningioma, recurrent of brain ([place])  2. Seizures [facility])  Mr. Pandolfo demonstrates modest clinical changes today, likely related to ongoing IMRT.  We recommended the following: -Increase Lamictal to 100mg  daily for one week, then increase to 100mg  BID -Continue Dilantin 300mg  HS.  -After one week decrease Keppra to 1000mg  BID -c/w Ativan 1-2mg  PO prn breathrough/prolonged seizures -Will continue to gradually Keppra due to mood side effects -Trial of dexamethasone 2mg  daily x7 days for leg sensory and gait impairment -If no improvement will consider referral to neuro-PT for gait training   We appreciate the opportunity to participate in the care of JAYSON MIGLIOZZI.  He will follow up again with [place] in 2 weeks following [place] appt to proceed with planned adjustments to this regimen.  All questions were answered. The patient knows to call the clinic with any problems, questions or concerns. No barriers to learning were detected.  He will follow up with MRI brain 3 months following radiation therapy.  The total time spent in the encounter was 25 minutes and more than 50% was on counseling and review of test results   [name], MD Medical Director of Neuro-Oncology [facility] at [place] 03/07/19 8:57 AM

## 2019-03-07 NOTE — Progress Notes (Signed)
  Radiation Oncology         [phone] ________________________________  Name: Joshua [name] MRN: [id number]  Date: 02/13/2019  DOB: 04/11/1961  SIMULATION AND TREATMENT PLANNING NOTE  DIAGNOSIS:     ICD-10-CM   1. 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[place])  C70.0      Site:  Postop PTV/ meningioma  NARRATIVE:  The patient was brought to the [facility].  Identity was confirmed.  All relevant records and images related to the planned course of therapy were reviewed.   Written consent to proceed with treatment was confirmed which was freely given after reviewing the details related to the planned course of therapy had been reviewed with the patient.  Then, the patient was set-up in a stable reproducible  supine position for radiation therapy.  CT images were obtained.  Surface markings were placed.    Medically necessary complex treatment device(s) for immobilization:  Customized headcast, accuform device.   The CT images were loaded into the planning software.  Then the target and avoidance structures were contoured.  Treatment planning then occurred.  The radiation prescription was entered and confirmed.  A total of 3 complex treatment devices were fabricated which relate to the designed radiation treatment fields. Each of these customized fields/ complex treatment devices will be used on a daily basis during the radiation course. I have requested : Intensity Modulated Radiotherapy (IMRT) is medically necessary for this case for the following reason:  Critical CNS structure avoidance - brainstem, optic chiasm, optic nerve..   The patient will undergo daily image guidance to ensure accurate localization of the target, and adequate minimize dose to the normal surrounding structures in close proximity to the target.  PLAN:  The patient will receive 60 Gy in 30 fractions to the high dose target, and 54 Gy in 30 fractions to the low dose target, using a simultaneous  integratee.  ________________________________   Jodelle Gross, MD, PhD

## 2019-03-07 NOTE — Telephone Encounter (Signed)
Scheduled appt per 8/20 los. ° °Spoke with patient and he is aware of his appt date and time. °

## 2019-03-08 ENCOUNTER — Other Ambulatory Visit: Payer: Self-pay

## 2019-03-08 ENCOUNTER — Other Ambulatory Visit: Payer: Self-pay | Admitting: Radiation Oncology

## 2019-03-08 ENCOUNTER — Ambulatory Visit
Admission: RE | Admit: 2019-03-08 | Discharge: 2019-03-08 | Disposition: A | Discharge status: Home / Self Care | Payer: Medicaid Other | Source: Ambulatory Visit | Attending: Radiation Oncology | Admitting: Radiation Oncology

## 2019-03-08 DIAGNOSIS — Z510 Encounter for antineoplastic radiation therapy: Secondary | ICD-10-CM | POA: Diagnosis not present

## 2019-03-08 MED ORDER — PROCHLORPERAZINE MALEATE 10 MG PO TABS: 10.0000 mg | ORAL_TABLET | Freq: Four times a day (QID) | ORAL | 1 refills | Status: DC | PRN

## 2019-03-11 ENCOUNTER — Ambulatory Visit
Admission: RE | Admit: 2019-03-11 | Discharge: 2019-03-11 | Disposition: A | Discharge status: Home / Self Care | Payer: Medicaid Other | Source: Ambulatory Visit | Attending: Radiation Oncology | Admitting: Radiation Oncology

## 2019-03-11 ENCOUNTER — Other Ambulatory Visit: Payer: Self-pay

## 2019-03-11 ENCOUNTER — Telehealth: Payer: Self-pay | Admitting: *Deleted

## 2019-03-11 DIAGNOSIS — Z510 Encounter for antineoplastic radiation therapy: Secondary | ICD-10-CM | POA: Diagnosis not present

## 2019-03-11 MED ORDER — DEXAMETHASONE 2 MG PO TABS: 2.0000 mg | ORAL_TABLET | Freq: Every day | ORAL | 0 refills | Status: DC

## 2019-03-11 NOTE — Telephone Encounter (Signed)
Patient notified. No further questions at this time.

## 2019-03-11 NOTE — Addendum Note (Signed)
Addended by: [name] on: 03/11/2019 12:57 PM   Modules accepted: Orders

## 2019-03-11 NOTE — Telephone Encounter (Signed)
May be inflammatory changes from radiation and recent seizure burden.  Recommended resuming decadron 2mg  daily until next visit in one week.  Ordered to pharmacy.  [name], MD

## 2019-03-11 NOTE — Telephone Encounter (Signed)
Patient's wife called to report concern that patient discussed with Dr Lisbeth Renshaw and was instructed to speak with Dr. Mickeal Skinner about.    Called patient to get further details.  He states he has noticed a numbness that has been in his leg that is now running up into his waist and then going up into his spine towards his shoulder blades and neck.  He describes it as a numbness/tightness.    Patient is actively being treated for Meningioma with radiation daily.  Next planned visit is over a week out.    Routed to Dr Mickeal Skinner for review.

## 2019-03-12 ENCOUNTER — Ambulatory Visit
Admission: RE | Admit: 2019-03-12 | Discharge: 2019-03-12 | Disposition: A | Discharge status: Home / Self Care | Payer: Medicaid Other | Source: Ambulatory Visit | Attending: Radiation Oncology | Admitting: Radiation Oncology

## 2019-03-12 ENCOUNTER — Emergency Department (HOSPITAL_COMMUNITY)
Admission: EM | Admit: 2019-03-12 | Discharge: 2019-03-12 | Disposition: A | Discharge status: Home / Self Care | Payer: Medicaid Other | Attending: Emergency Medicine | Admitting: Emergency Medicine

## 2019-03-12 ENCOUNTER — Emergency Department (HOSPITAL_COMMUNITY): Payer: Medicaid Other

## 2019-03-12 ENCOUNTER — Other Ambulatory Visit: Payer: Self-pay

## 2019-03-12 DIAGNOSIS — R569 Unspecified convulsions: Secondary | ICD-10-CM | POA: Insufficient documentation

## 2019-03-12 DIAGNOSIS — Z79899 Other long term (current) drug therapy: Secondary | ICD-10-CM | POA: Diagnosis not present

## 2019-03-12 DIAGNOSIS — Z7982 Long term (current) use of aspirin: Secondary | ICD-10-CM | POA: Insufficient documentation

## 2019-03-12 DIAGNOSIS — R0602 Shortness of breath: Secondary | ICD-10-CM | POA: Insufficient documentation

## 2019-03-12 DIAGNOSIS — R0789 Other chest pain: Secondary | ICD-10-CM | POA: Diagnosis not present

## 2019-03-12 DIAGNOSIS — Z923 Personal history of irradiation: Secondary | ICD-10-CM | POA: Diagnosis not present

## 2019-03-12 DIAGNOSIS — F17210 Nicotine dependence, cigarettes, uncomplicated: Secondary | ICD-10-CM | POA: Diagnosis not present

## 2019-03-12 DIAGNOSIS — C700 Malignant neoplasm of cerebral meninges: Secondary | ICD-10-CM | POA: Diagnosis not present

## 2019-03-12 DIAGNOSIS — Z510 Encounter for antineoplastic radiation therapy: Secondary | ICD-10-CM | POA: Diagnosis not present

## 2019-03-12 LAB — CBC WITH DIFFERENTIAL/PLATELET
Abs Immature Granulocytes: 0.03 10*3/uL (ref 0.00–0.07)
Basophils Absolute: 0 10*3/uL (ref 0.0–0.1)
Basophils Relative: 1 %
Eosinophils Absolute: 0.2 10*3/uL (ref 0.0–0.5)
Eosinophils Relative: 3 %
HCT: 42.7 % (ref 39.0–52.0)
Hemoglobin: 14.7 g/dL (ref 13.0–17.0)
Immature Granulocytes: 0 %
Lymphocytes Relative: 30 %
Lymphs Abs: 2.1 10*3/uL (ref 0.7–4.0)
MCH: 32.1 pg (ref 26.0–34.0)
MCHC: 34.4 g/dL (ref 30.0–36.0)
MCV: 93.2 fL (ref 80.0–100.0)
Monocytes Absolute: 0.7 10*3/uL (ref 0.1–1.0)
Monocytes Relative: 10 %
Neutro Abs: 3.9 10*3/uL (ref 1.7–7.7)
Neutrophils Relative %: 56 %
Platelets: 224 10*3/uL (ref 150–400)
RBC: 4.58 MIL/uL (ref 4.22–5.81)
RDW: 12.3 % (ref 11.5–15.5)
WBC: 6.9 10*3/uL (ref 4.0–10.5)
nRBC: 0 % (ref 0.0–0.2)

## 2019-03-12 LAB — BASIC METABOLIC PANEL
Anion gap: 9 (ref 5–15)
BUN: 14 mg/dL (ref 6–20)
CO2: 27 mmol/L (ref 22–32)
Calcium: 9.3 mg/dL (ref 8.9–10.3)
Chloride: 104 mmol/L (ref 98–111)
Creatinine, Ser: 0.83 mg/dL (ref 0.61–1.24)
GFR calc Af Amer: >60 mL/min (ref >60)
GFR calc non Af Amer: >60 mL/min (ref >60)
Glucose, Bld: 145 mg/dL — ABNORMAL HIGH (ref 70–99)
Potassium: 4.1 mmol/L (ref 3.5–5.1)
Sodium: 140 mmol/L (ref 135–145)

## 2019-03-12 LAB — TROPONIN I (HIGH SENSITIVITY)
Troponin I (High Sensitivity): 5 ng/L (ref <18)
Troponin I (High Sensitivity): 5 ng/L (ref <18)
Troponin I (High Sensitivity): 5 ng/L (ref <18)

## 2019-03-12 MED ORDER — IOHEXOL 350 MG/ML SOLN
75.0000 mL | Freq: Once | INTRAVENOUS | Status: AC | PRN
  Administered 2019-03-12: 75 mL via INTRAVENOUS

## 2019-03-12 MED ORDER — ASPIRIN 81 MG PO CHEW
243.0000 mg | CHEWABLE_TABLET | Freq: Once | ORAL | Status: AC
  Administered 2019-03-12: 243 mg via ORAL
  Filled 2019-03-12: qty 3

## 2019-03-12 NOTE — ED Triage Notes (Signed)
Pt had his daily radiation treatment. Pt states when arrived home he began to had difficulty breathing. Wife reports seizure like activity. Pt SpO2 96%. Pt reports he took [place] today

## 2019-03-12 NOTE — ED Provider Notes (Signed)
[facility] EMERGENCY DEPARTMENT Provider Note   CSN: [id number] Arrival date & time: 03/12/19  1414     History   Chief Complaint Chief Complaint  Patient presents with   Shortness of Breath   Chest Pain    HPI Joshua [name] is a 58 y.o. male with history of malignant meningioma currently receiving radiation treatment to the brain, no chemo or immunotherapy at this time, also with history of seizures.  Patient reports approximately 1 hour prior to arrival sudden onset of left-sided chest pain he describes as a severe tightness constant radiating to his shoulder and without clear aggravating or alleviating factors.  Patient reports that shortly after his chest pain began he felt a prodrome of a seizure, he reports partial seizures involving the left side of his body ever since he has brain cancer diagnosis, he took 1 dose of his home Ativan with resolution of these partial seizure-like activities which she describes as a numbness tingling and shaking of the left side of his body, he reports that these are consistent with his seizures.  Patient reports that with onset of chest pain he also developed shortness of breath, he denies any pain with deep breathing however he felt like a difficulty catching a full breath and anxiety.  He reports the shortness of breath has improved since ED arrival.  He has taken 81 mg aspirin with onset of his chest pain.  He denies similar chest pain in the past.  Patient denies fever/chills, cough/hemoptysis, abdominal pain, nausea/vomiting, diarrhea, fall/injury, history of blood clot, anticoagulation or any additional concerns.    HPI  Past Medical History:  Diagnosis Date   Brain tumor ([place]) 02/20/2013   brain tumor removed in March 2014, Dr Donald Pore   Dizziness    Enlarged prostate    Headache(784.0)    scattered   Meningioma [facility])     Patient Active Problem List   Diagnosis Date Noted   Malignant meningioma of  meninges of brain ([name]) 03/07/2019   Seizures ([place]) 01/31/2019   Meningioma, recurrent of brain ([place]) 01/31/2019   Meningioma ([place]) 01/03/2019   Brain tumor ([place]) 12/27/2018   Diffuse idiopathic skeletal hyperostosis 06/05/2013    Past Surgical History:  Procedure Laterality Date   APPLICATION OF CRANIAL NAVIGATION N/A 01/10/2019   Procedure: APPLICATION OF CRANIAL NAVIGATION;  Surgeon: Erline Levine, MD;  Location: [place];  Service: Neurosurgery;  Laterality: N/A;   CATARACT EXTRACTION Right    CRANIOTOMY Right 11/06/2012   Procedure: CRANIOTOMY TUMOR EXCISION;  Surgeon: Erline Levine, MD;  Location: [place] [facility];  Service: Neurosurgery;  Laterality: Right;  Right Parasagittal craniotomy for meningioma with Stealth   CRANIOTOMY Right 01/10/2019   Procedure: Right Parasagittal Craniotomy for Tumor;  Surgeon: Erline Levine, MD;  Location: [place];  Service: Neurosurgery;  Laterality: Right;  Right parasagittal craniotomy for tumor   ESOPHAGOGASTRODUODENOSCOPY     right knee arthroscopy          Home Medications    Prior to Admission medications   Medication Sig Start Date End Date Taking? Authorizing Provider  aspirin EC 81 MG EC tablet Take 1 tablet (81 mg total) by mouth daily. 01/14/19  Yes Erline Levine, MD  dexamethasone (DECADRON) 2 MG tablet Take 1 tablet (2 mg total) by mouth daily. 03/11/19  Yes Vaslow, Acey Lav, MD  FIBER ADULT GUMMIES PO Take 1 tablet by mouth daily.   Yes [provider]  lamoTRIgine (LAMICTAL) 100 MG tablet Take 100mg  by  mouth daily for 7 days, then increase to 100mg  twice per day 03/07/19  Yes Vaslow, Acey Lav, MD  levETIRAcetam (KEPPRA) 500 MG tablet Take 2 tablets (1,000 mg total) by mouth 2 (two) times daily. 03/14/19  Yes Vaslow, Acey Lav, MD  LORazepam (ATIVAN) 1 MG tablet Take 1 tablet (1 mg total) by mouth every 8 (eight) hours as needed for seizure (for breakthrough seizures). 02/21/19  Yes Vaslow, Acey Lav, MD  Melatonin 5 MG CHEW  Chew 3 tablets by mouth at bedtime as needed (sleep).   Yes [provider]  naproxen sodium (ALEVE) 220 MG tablet Take 440 mg by mouth as needed (pain or headache).   Yes [provider]  phenytoin (DILANTIN) 300 MG ER capsule Take 1 capsule (300 mg total) by mouth at bedtime. 02/05/19  Yes Vaslow, Acey Lav, MD  prochlorperazine (COMPAZINE) 10 MG tablet Take 1 tablet (10 mg total) by mouth every 6 (six) hours as needed for nausea or vomiting. 03/08/19  Yes Kyung Rudd, MD  triamcinolone cream (KENALOG) 0.1 % Apply 1 application topically 2 (two) times daily. 02/25/19  Yes Charlott Rakes, MD  vitamin C (ASCORBIC ACID) 500 MG tablet Take 500 mg by mouth daily.   Yes [provider]    Family History Family History  Problem Relation Age of Onset   Hypertension Mother    Hypertension Father     Social History Social History   Tobacco Use   Smoking status: Current Some Day Smoker    Types: Cigarettes   Smokeless tobacco: Never Used   Tobacco comment: hasn't smoked in a week  Substance Use Topics   Alcohol use: Yes    Alcohol/week: 8.0 standard drinks    Types: 6 Cans of beer, 2 Shots of liquor per week    Comment: occasionally   Drug use: No     Allergies   Patient has no known allergies.   Review of Systems Review of Systems Ten systems are reviewed and are negative for acute change except as noted in the HPI  Physical Exam Updated Vital Signs BP (!) 123/96    Pulse 85    Temp 99.8 F (37.7 C) (Rectal)    Resp 18    SpO2 97%   Physical Exam Constitutional:      General: He is not in acute distress.    Appearance: Normal appearance. He is well-developed. He is not ill-appearing or diaphoretic.  HENT:     Head: Normocephalic and atraumatic.     Comments: Well-healed craniotomy scar    Right Ear: External ear normal.     Left Ear: External ear normal.     Nose: Nose normal.  Eyes:     General: Vision grossly intact. Gaze aligned  appropriately.     Pupils: Pupils are equal, round, and reactive to light.  Neck:     Musculoskeletal: Normal range of motion.     Trachea: Trachea and phonation normal. No tracheal deviation.  Cardiovascular:     Rate and Rhythm: Normal rate and regular rhythm.     Pulses:          Radial pulses are 2+ on the right side and 2+ on the left side.       Dorsalis pedis pulses are 2+ on the right side and 2+ on the left side.     Heart sounds: Normal heart sounds.  Pulmonary:     Effort: Pulmonary effort is normal. No accessory muscle usage or respiratory distress.  Breath sounds: Normal breath sounds. No decreased breath sounds.  Chest:     Chest wall: No deformity, tenderness or crepitus.  Abdominal:     General: There is no distension.     Palpations: Abdomen is soft.     Tenderness: There is no abdominal tenderness. There is no guarding or rebound.  Musculoskeletal: Normal range of motion.     Right lower leg: He exhibits no tenderness. No edema.     Left lower leg: He exhibits no tenderness. No edema.  Skin:    General: Skin is warm and dry.  Neurological:     Mental Status: He is alert.     GCS: GCS eye subscore is 4. GCS verbal subscore is 5. GCS motor subscore is 6.     Comments: Speech is clear and goal oriented, follows commands Major Cranial nerves without deficit, no facial droop Moves extremities without ataxia, coordination intact  Psychiatric:        Behavior: Behavior normal.    ED Treatments / Results  Labs (all labs ordered are listed, but only abnormal results are displayed) Labs Reviewed  BASIC METABOLIC PANEL - Abnormal; Notable for the following components:      Result Value   Glucose, Bld 145 (*)    All other components within normal limits  CBC WITH DIFFERENTIAL/PLATELET  TROPONIN I (HIGH SENSITIVITY)  TROPONIN I (HIGH SENSITIVITY)  TROPONIN I (HIGH SENSITIVITY)    EKG EKG Interpretation  Date/Time:  Tuesday March 12 2019 14:44:42  EDT Ventricular Rate:  92 PR Interval:    QRS Duration: 92 QT Interval:  359 QTC Calculation: 445 R Axis:   89 Text Interpretation:  Sinus rhythm Borderline T wave abnormalities since last tracing no significant change Confirmed by Daleen Bo [phone]) on 03/12/2019 3:12:19 PM   Radiology Ct Angio Chest Pe W/cm &/or Wo Cm  Result Date: 03/12/2019 CLINICAL DATA:  Difficulty breathing. EXAM: CT ANGIOGRAPHY CHEST WITH CONTRAST TECHNIQUE: Multidetector CT imaging of the chest was performed using the standard protocol during bolus administration of intravenous contrast. Multiplanar CT image reconstructions and MIPs were obtained to evaluate the vascular anatomy. CONTRAST:  23mL OMNIPAQUE IOHEXOL 350 MG/ML SOLN COMPARISON:  Radiograph same day. FINDINGS: Cardiovascular: Satisfactory opacification of the pulmonary arteries to the segmental level. No evidence of pulmonary embolism. Normal heart size. No pericardial effusion. Mediastinum/Nodes: No enlarged mediastinal, hilar, or axillary lymph nodes. Thyroid gland, trachea, and esophagus demonstrate no significant findings. Lungs/Pleura: Lungs are clear. No pleural effusion or pneumothorax. Upper Abdomen: No acute abnormality. Musculoskeletal: No chest wall abnormality. No acute or significant osseous findings. Review of the MIP images confirms the above findings. IMPRESSION: No definite evidence of pulmonary embolus. No definite abnormality seen in the chest. Electronically Signed   By: Marijo Conception M.D.   On: 03/12/2019 17:46   Dg Chest Port 1 View  Result Date: 03/12/2019 CLINICAL DATA:  Chest pain, shortness of breath, EXAM: PORTABLE CHEST 1 VIEW COMPARISON:  10/30/2018 FINDINGS: No consolidation, features of edema, pneumothorax, or effusion. Pulmonary vascularity is normally distributed. The cardiomediastinal contours are unremarkable. No acute osseous or soft tissue abnormality. IMPRESSION: No acute cardiopulmonary abnormality. Electronically  Signed   By: Lovena Le M.D.   On: 03/12/2019 15:22    Procedures Procedures (including critical care time)  Medications Ordered in ED Medications  aspirin chewable tablet 243 mg (243 mg Oral Given 03/12/19 1512)  iohexol (OMNIPAQUE) 350 MG/ML injection 75 mL (75 mLs Intravenous Contrast Given 03/12/19 1716)  Initial Impression / Assessment and Plan / ED Course  I have reviewed the triage vital signs and the nursing notes.  Pertinent labs & imaging results that were available during my care of the patient were reviewed by me and considered in my medical decision making (see chart for details).  Clinical Course as of Mar 11 1958  Tue Mar 12, 2019  1922 Discharge with PCP and Oncology follow-up.   [BM]    Clinical Course User Index [BM] Deliah Boston, PA-C   On arrival patient is anxious appearing, mildly tachycardic with SPO2 98% on room air, heart regular rate and rhythm without murmur, lungs clear bilaterally, abdomen soft nontender without peritoneal signs, extremities neurovascular intact with no sign of DVT.  Based on patient's age, cancer history and tachycardia will obtain CT PE study in addition to chest pain work-up.  He will be given the remainder of aspirin today. - CBC within normal limits BMP nonacute High-sensitivity troponin: 5 Chest x-ray:    IMPRESSION:  No acute cardiopulmonary abnormality.   EKG: Sinus rhythm Borderline T wave abnormalities since last tracing no significant change Confirmed by Daleen Bo [phone]) on 03/12/2019 3:12:19 PM - Patient reassessed resting comfortably no acute distress.  Reports resolution of chest pain/shortness of breath today he reports he is feeling well.  Awaiting CT angios PE study at this time. - CT PE Study: IMPRESSION:  No definite evidence of pulmonary embolus.    No definite abnormality seen in the chest.  - Delta high-sensitivity troponin:5, drawn prematurely, additional ordered - Delta  high-sensitivity troponin: 5 - Suspect patient with musculoskeletal spasm as etiology of his chest tightness today, resolved following Ativan.  Reassuring CT scan without evidence of pulmonary embolism.  Reassuring work-up as above with heart score of less than 4, do not suspect ACS as etiology of his pain.  History, physical and work-up above non-concerning for dissection.  Do not suspect other acute cardiopulmonary etiology of patient's symptoms today.  He has been chest pain-free for multiple hours here in the ED resting comfortably with stable vital signs.  On reevaluation he is in no acute distress and requesting discharge.  Patient will be discharged and advised to follow-up with his oncologist and PCP regarding his visit today.  At this time there does not appear to be any evidence of an acute emergency medical condition and the patient appears stable for discharge with appropriate outpatient follow up. Diagnosis was discussed with patient who verbalizes understanding of care plan and is agreeable to discharge. I have discussed return precautions with patient and wife who verbalizes understanding of return precautions. Patient encouraged to follow-up with their PCP and oncology. All questions answered.  Patient seen and evaluated by Dr. Eulis Foster during this visit who agrees with discharge and outpatient follow-up at this time.  Note: Portions of this report may have been transcribed using voice recognition software. Every effort was made to ensure accuracy; however, inadvertent computerized transcription errors may still be present. Final Clinical Impressions(s) / ED Diagnoses   Final diagnoses:  Atypical chest pain    ED Discharge Orders    None       Gari Crown 03/12/19 Jones Bales, MD 03/14/19 1728

## 2019-03-12 NOTE — ED Provider Notes (Signed)
  Face-to-face evaluation   History: He presents for evaluation of muscle spasm and seizure.  This pain and shortness of breath today.  Seizure occurred after the other problems.  Took an extra Ativan today, because of the spasm.  He is getting daily intracranial radiation for meningioma.  Physical exam:, Calm, cooperative.  No dysarthria or aphasia.  Lungs clear bilaterally.  Chest nontender.  Abdomen nontender.  Medical screening examination/treatment/procedure(s) were conducted as a shared visit with non-physician practitioner(s) and myself.  I personally evaluated the patient during the encounter    Daleen Bo, MD 03/14/19 1729

## 2019-03-12 NOTE — Discharge Instructions (Addendum)
You have been diagnosed today with atypical chest pain.  At this time there does not appear to be the presence of an emergent medical condition, however there is always the potential for conditions to change. Please read and follow the below instructions.  Please return to the Emergency Department immediately for any new or worsening symptoms or if your symptoms return. Please be sure to follow up with your Primary Care Provider within one week regarding your visit today; please call their office to schedule an appointment even if you are feeling better for a follow-up visit. Additionally call your oncologist tomorrow to schedule a follow-up appointment regarding your ED visit today.  Get help right away if: Your chest pain is worse. You have a cough that gets worse, or you cough up blood. You have very bad (severe) pain in your belly (abdomen). You pass out (faint). You have either of these for no clear reason: Sudden chest discomfort. Sudden discomfort in your arms, back, neck, or jaw. You have shortness of breath at any time. You suddenly start to sweat, or your skin gets clammy. You feel sick to your stomach (nauseous). You throw up (vomit). You suddenly feel lightheaded or dizzy. You feel very weak or tired. Your heart starts to beat fast, or it feels like it is skipping beats. Any new/concerning or worsening symptoms  Please read the additional information packets attached to your discharge summary.

## 2019-03-12 NOTE — ED Notes (Signed)
Patient verbalizes understanding of discharge instructions. Opportunity for questioning and answers were provided. Armband removed by staff, pt discharged from ED ambulatory.   

## 2019-03-13 ENCOUNTER — Other Ambulatory Visit: Payer: Self-pay

## 2019-03-13 ENCOUNTER — Ambulatory Visit
Admission: RE | Admit: 2019-03-13 | Discharge: 2019-03-13 | Disposition: A | Discharge status: Home / Self Care | Payer: Medicaid Other | Source: Ambulatory Visit | Attending: Radiation Oncology | Admitting: Radiation Oncology

## 2019-03-13 DIAGNOSIS — Z510 Encounter for antineoplastic radiation therapy: Secondary | ICD-10-CM | POA: Diagnosis not present

## 2019-03-14 ENCOUNTER — Other Ambulatory Visit: Payer: Self-pay

## 2019-03-14 ENCOUNTER — Telehealth: Payer: Self-pay

## 2019-03-14 ENCOUNTER — Ambulatory Visit
Admission: RE | Admit: 2019-03-14 | Discharge: 2019-03-14 | Disposition: A | Discharge status: Home / Self Care | Payer: Medicaid Other | Source: Ambulatory Visit | Attending: Radiation Oncology | Admitting: Radiation Oncology

## 2019-03-14 ENCOUNTER — Other Ambulatory Visit: Payer: Self-pay | Admitting: Internal Medicine

## 2019-03-14 DIAGNOSIS — Z510 Encounter for antineoplastic radiation therapy: Secondary | ICD-10-CM | POA: Diagnosis not present

## 2019-03-14 MED ORDER — LORAZEPAM 1 MG PO TABS: 1.0000 mg | ORAL_TABLET | Freq: Three times a day (TID) | ORAL | 0 refills | Status: DC | PRN

## 2019-03-14 NOTE — Telephone Encounter (Signed)
Pt called stating he had "an episode yesterday". Stated he had severe back pain and he went to the ED. While at the ED, pt had a seizure. Pt states that he is "running low on the pills" and needs a refill. Pt states he is taking ativan for breakthrough seizures and only has 2 pills left. Dr. Mickeal Skinner made aware and prescription will be sent in.  Pt's wife notified about prescription refill and verbalized understanding, and will relay information to pt.

## 2019-03-15 ENCOUNTER — Ambulatory Visit
Admission: RE | Admit: 2019-03-15 | Discharge: 2019-03-15 | Disposition: A | Discharge status: Home / Self Care | Payer: Medicaid Other | Source: Ambulatory Visit | Attending: Radiation Oncology | Admitting: Radiation Oncology

## 2019-03-15 ENCOUNTER — Other Ambulatory Visit: Payer: Self-pay

## 2019-03-15 DIAGNOSIS — Z510 Encounter for antineoplastic radiation therapy: Secondary | ICD-10-CM | POA: Diagnosis not present

## 2019-03-18 ENCOUNTER — Other Ambulatory Visit: Payer: Self-pay

## 2019-03-18 ENCOUNTER — Ambulatory Visit
Admission: RE | Admit: 2019-03-18 | Discharge: 2019-03-18 | Disposition: A | Discharge status: Home / Self Care | Payer: Medicaid Other | Source: Ambulatory Visit | Attending: Radiation Oncology | Admitting: Radiation Oncology

## 2019-03-18 DIAGNOSIS — Z510 Encounter for antineoplastic radiation therapy: Secondary | ICD-10-CM | POA: Diagnosis not present

## 2019-03-19 ENCOUNTER — Other Ambulatory Visit: Payer: Self-pay

## 2019-03-19 ENCOUNTER — Ambulatory Visit
Admission: RE | Admit: 2019-03-19 | Discharge: 2019-03-19 | Disposition: A | Discharge status: Home / Self Care | Payer: Medicaid Other | Source: Ambulatory Visit | Attending: Radiation Oncology | Admitting: Radiation Oncology

## 2019-03-19 DIAGNOSIS — Z510 Encounter for antineoplastic radiation therapy: Secondary | ICD-10-CM | POA: Diagnosis present

## 2019-03-19 DIAGNOSIS — C700 Malignant neoplasm of cerebral meninges: Secondary | ICD-10-CM | POA: Insufficient documentation

## 2019-03-20 ENCOUNTER — Other Ambulatory Visit: Payer: Self-pay

## 2019-03-20 ENCOUNTER — Ambulatory Visit
Admission: RE | Admit: 2019-03-20 | Discharge: 2019-03-20 | Disposition: A | Discharge status: Home / Self Care | Payer: Medicaid Other | Source: Ambulatory Visit | Attending: Radiation Oncology | Admitting: Radiation Oncology

## 2019-03-20 DIAGNOSIS — Z510 Encounter for antineoplastic radiation therapy: Secondary | ICD-10-CM | POA: Diagnosis not present

## 2019-03-21 ENCOUNTER — Telehealth: Payer: Self-pay | Admitting: Internal Medicine

## 2019-03-21 ENCOUNTER — Inpatient Hospital Stay: Payer: Medicaid Other

## 2019-03-21 ENCOUNTER — Inpatient Hospital Stay: Payer: Medicaid Other | Attending: Internal Medicine | Admitting: Internal Medicine

## 2019-03-21 ENCOUNTER — Ambulatory Visit
Admission: RE | Admit: 2019-03-21 | Discharge: 2019-03-21 | Disposition: A | Discharge status: Home / Self Care | Payer: Medicaid Other | Source: Ambulatory Visit | Attending: Radiation Oncology | Admitting: Radiation Oncology

## 2019-03-21 ENCOUNTER — Telehealth: Payer: Self-pay | Admitting: *Deleted

## 2019-03-21 ENCOUNTER — Other Ambulatory Visit: Payer: Self-pay

## 2019-03-21 VITALS — BP 128/83 | HR 73 | Temp 99.1°F | Resp 18 | Ht 69.0 in | Wt 191.5 lb

## 2019-03-21 DIAGNOSIS — R251 Tremor, unspecified: Secondary | ICD-10-CM | POA: Insufficient documentation

## 2019-03-21 DIAGNOSIS — G40109 Localization-related (focal) (partial) symptomatic epilepsy and epileptic syndromes with simple partial seizures, not intractable, without status epilepticus: Secondary | ICD-10-CM | POA: Diagnosis not present

## 2019-03-21 DIAGNOSIS — R200 Anesthesia of skin: Secondary | ICD-10-CM | POA: Diagnosis not present

## 2019-03-21 DIAGNOSIS — D329 Benign neoplasm of meninges, unspecified: Secondary | ICD-10-CM | POA: Diagnosis not present

## 2019-03-21 DIAGNOSIS — R279 Unspecified lack of coordination: Secondary | ICD-10-CM | POA: Insufficient documentation

## 2019-03-21 DIAGNOSIS — F17210 Nicotine dependence, cigarettes, uncomplicated: Secondary | ICD-10-CM | POA: Insufficient documentation

## 2019-03-21 DIAGNOSIS — D320 Benign neoplasm of cerebral meninges: Secondary | ICD-10-CM

## 2019-03-21 DIAGNOSIS — Z79899 Other long term (current) drug therapy: Secondary | ICD-10-CM | POA: Diagnosis not present

## 2019-03-21 DIAGNOSIS — Z510 Encounter for antineoplastic radiation therapy: Secondary | ICD-10-CM | POA: Diagnosis not present

## 2019-03-21 DIAGNOSIS — R569 Unspecified convulsions: Secondary | ICD-10-CM | POA: Diagnosis not present

## 2019-03-21 MED ORDER — CLONAZEPAM 0.5 MG PO TABS: 0.5000 mg | ORAL_TABLET | Freq: Two times a day (BID) | ORAL | 0 refills | Status: DC

## 2019-03-21 MED ORDER — LEVETIRACETAM 500 MG PO TABS: 2000.0000 mg | ORAL_TABLET | Freq: Two times a day (BID) | ORAL | 3 refills | Status: DC

## 2019-03-21 MED ORDER — DEXAMETHASONE 2 MG PO TABS: 4.0000 mg | ORAL_TABLET | Freq: Every day | ORAL | 0 refills | Status: DC

## 2019-03-21 NOTE — Telephone Encounter (Signed)
Scheduled appt per 9/3 los.  Spoke with patient and he is aware of his appt date and time

## 2019-03-21 NOTE — Telephone Encounter (Signed)
Received vm message from patient requesting to review new medication regime as outlined by Dr. Mickeal Skinner today.   TCT patient but no answer. Was able to leave vm message for pt to call back @ [phone]. Per Dr. Mickeal Skinner: Pt was to take Ativan once he got home today. Then: Start clonezepam this afternoon. Tomorrow he will start twice a day dosing of this. Keppra: to start 2gms this afternoon then BID tomorrow (doubling his dose) Decadron: to start 4mg  daily tomorrow morning.  Awaiting call back from patient to review the above.

## 2019-03-21 NOTE — Telephone Encounter (Signed)
Received call from pt's wife regarding pt's next appt. And she also had some medication questions.  She states that Dr. Mickeal Skinner wanted pt to return on Monday, however that is a holiday. Advised that MD is ok with visit on Tuesday, 03/26/19. High priority scheduling message sent. She also wanted to verify prescriptions that were sent in. Advised that refill has been sent for decadron and klonopin to [facility] on [address]. She voiced understanding. Pt had run out of Decadron so she wanted to make sure a refill has been sent in.  Assured her that it was sent. No further questions or concerns. She knows to call with questions or take pt to ED if seizures worsen.

## 2019-03-21 NOTE — Progress Notes (Signed)
[facility] at [place], [place] [address] [phone]   Interval Evaluation  Date of Service: 03/21/19 Patient Name: Joshua [name] Patient MRN: [id number] Patient DOB: 05/21/1961 Provider: [name], MD  Identifying Statement:  URIAH KLABUNDE is a 58 y.o. 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 Vertell Limber (WHO I) 01/10/19: Repeat craniotomy, debulking resection by Dr. Vertell Limber after tumor recurrence, seizures.  Path demonstrates islands of anaplasia c/w grade II/III. 02/21/19: Begins IMRT with Dr. Lisbeth Renshaw  Interval History:  [name] presents today for follow up given recent breakthrough seizures.  He describes near continuous shaking of his left leg since radiation session on Monday.  He has dosed 2mg  of ativan multiple times, and although it has been effective the benefit has not persisted. The foot is weak as well, with particular difficulty with dorsiflexion.  Left leg and foot numbness and incoordination are also worsened. Now s/p 4 weeks of IMRT for recurrent meningioma.    H+P (02/05/19) Patient presents to review clinical course and care for his meningioma.  Initially he presented in 2014 with headache syndrome, was found to have tumor on imaging which was resected by Dr. Vertell Limber.  The patient was lost to follow up for ~5 years until he developed seizures, described as "twitching of left leg spreading to arm" this past month.  He required loads of Keppra and Dilantin to break events.  MRI demonstrated significant regrowth of the mass, and repeat craniotomy was performed on 01/10/19.  Small amount of residual tumor was visible on post-operative MRI.  Since surgery he has continued to experience numbness and clumsiness of his left leg, requiring cane for ambulation.  He is still pending home physical therapy evaluation.  He has  had "small" seizures, consisting of few seconds of shaking of left leg, occurring less than daily.  Continues on Keppra and Dilantin.    Medications: Current Outpatient Medications on File Prior to Visit  Medication Sig Dispense Refill  . aspirin EC 81 MG EC tablet Take 1 tablet (81 mg total) by mouth daily. 30 tablet 3  . dexamethasone (DECADRON) 2 MG tablet Take 1 tablet (2 mg total) by mouth daily. 10 tablet 0  . FIBER ADULT GUMMIES PO Take 1 tablet by mouth daily.    Marland Kitchen lamoTRIgine (LAMICTAL) 100 MG tablet Take 100mg  by mouth daily for 7 days, then increase to 100mg  twice per day 30 tablet 3  . levETIRAcetam (KEPPRA) 500 MG tablet Take 2 tablets (1,000 mg total) by mouth 2 (two) times daily. 180 tablet 3  . LORazepam (ATIVAN) 1 MG tablet Take 1 tablet (1 mg total) by mouth every 8 (eight) hours as needed for seizure (for breakthrough seizures). 12 tablet 0  . Melatonin 5 MG CHEW Chew 3 tablets by mouth at bedtime as needed (sleep).    . naproxen sodium (ALEVE) 220 MG tablet Take 440 mg by mouth as needed (pain or headache).    . phenytoin (DILANTIN) 300 MG ER capsule Take 1 capsule (300 mg total) by mouth at bedtime. 30 capsule 3  . prochlorperazine (COMPAZINE) 10 MG tablet Take 1 tablet (10 mg total) by mouth every 6 (six) hours as needed for nausea or vomiting. 30 tablet 1  . triamcinolone cream (KENALOG) 0.1 % Apply 1 application topically 2 (two) times daily. 30 g 1  . vitamin C (ASCORBIC ACID) 500 MG  tablet Take 500 mg by mouth daily.     No current facility-administered medications on file prior to visit.     Allergies: No Known Allergies Past Medical History:  Past Medical History:  Diagnosis Date  . Brain tumor ([place]) 02/20/2013   brain tumor removed in March 2014, Dr Donald Pore  . Dizziness   . Enlarged prostate   . Headache(784.0)    scattered  . Meningioma [facility])    Past Surgical History:  Past Surgical History:  Procedure Laterality Date  . APPLICATION OF CRANIAL  NAVIGATION N/A 01/10/2019   Procedure: APPLICATION OF CRANIAL NAVIGATION;  Surgeon: Erline Levine, MD;  Location: [facility];  Service: Neurosurgery;  Laterality: N/A;  . CATARACT EXTRACTION Right   . CRANIOTOMY Right 11/06/2012   Procedure: CRANIOTOMY TUMOR EXCISION;  Surgeon: Erline Levine, MD;  Location: [facility];  Service: Neurosurgery;  Laterality: Right;  Right Parasagittal craniotomy for meningioma with Stealth  . CRANIOTOMY Right 01/10/2019   Procedure: Right Parasagittal Craniotomy for Tumor;  Surgeon: Erline Levine, MD;  Location: [facility];  Service: Neurosurgery;  Laterality: Right;  Right parasagittal craniotomy for tumor  . ESOPHAGOGASTRODUODENOSCOPY    . right knee arthroscopy     Social History:  Social History   Socioeconomic History  . Marital status: Married    Spouse name: Not on file  . Number of children: Not on file  . Years of education: Not on file  . Highest education level: Not on file  Occupational History  . Not on file  Social Needs  . Financial resource strain: Not on file  . Food insecurity    Worry: Not on file    Inability: Not on file  . Transportation needs    Medical: No    Non-medical: No  Tobacco Use  . Smoking status: Current Some Day Smoker    Types: Cigarettes  . Smokeless tobacco: Never Used  . Tobacco comment: hasn't smoked in a week  Substance and Sexual Activity  . Alcohol use: Yes    Alcohol/week: 8.0 standard drinks    Types: 6 Cans of beer, 2 Shots of liquor per week    Comment: occasionally  . Drug use: No  . Sexual activity: Yes  Lifestyle  . Physical activity    Days per week: Not on file    Minutes per session: Not on file  . Stress: Not on file  Relationships  . Social Herbalist on phone: Not on file    Gets together: Not on file    Attends religious service: Not on file    Active member of club or organization: Not on file    Attends meetings of clubs or organizations: Not on file    Relationship status: Not  on file  . Intimate partner violence    Fear of current or ex partner: Not on file    Emotionally abused: Not on file    Physically abused: Not on file    Forced sexual activity: Not on file  Other Topics Concern  . Not on file  Social History Narrative  . Not on file   Family History:  Family History  Problem Relation Age of Onset  . Hypertension Mother   . Hypertension Father     Review of Systems: Constitutional: Denies fevers, chills or abnormal weight loss Eyes: Denies blurriness of vision Ears, nose, mouth, throat, and face: Denies mucositis or sore throat Respiratory: Denies cough, dyspnea or wheezes Cardiovascular: Denies palpitation, chest  discomfort or lower extremity swelling Gastrointestinal:  Denies nausea, constipation, diarrhea GU: Denies dysuria or incontinence Skin: Denies abnormal skin rashes Neurological: Per HPI Musculoskeletal: Denies joint pain, back or neck discomfort. No decrease in ROM Behavioral/Psych: Denies anxiety, disturbance in thought content, and mood instability  Physical Exam: Vitals:   03/21/19 0911  BP: 128/83  Pulse: 73  Resp: 18  Temp: 99.1 F (37.3 C)  SpO2: 99%   KPS: 80. General: Alert, cooperative, pleasant, in no acute distress Head: Craniotomy scar noted, dry and intact. EENT: No conjunctival injection or scleral icterus. Oral mucosa moist Lungs: Resp effort normal Cardiac: Regular rate and rhythm Abdomen: Soft, non-distended abdomen Skin: No rashes cyanosis or petechiae. Extremities: No clubbing or edema  Neurologic Exam: Mental Status: Awake, alert, attentive to examiner. Oriented to self and environment. Language is fluent with intact comprehension.  Cranial Nerves: Visual acuity is grossly normal. Visual fields are full. Extra-ocular movements intact. No ptosis. Face is symmetric, tongue midline. Motor: Tone and bulk are normal. Rhythmic involuntary shaking of left leg. Power is impaired in left leg, 4/5 distally.  Reflexes are symmetric, no pathologic reflexes present. Impaired heel to shin left leg Sensory: Diminished to light touch and temp left lower leg Gait: Cane assisted  Labs: I have reviewed the data as listed    Component Value Date/Time   NA 140 03/12/2019 1502   K 4.1 03/12/2019 1502   CL 104 03/12/2019 1502   CO2 27 03/12/2019 1502   GLUCOSE 145 (H) 03/12/2019 1502   BUN 14 03/12/2019 1502   CREATININE 0.83 03/12/2019 1502   CALCIUM 9.3 03/12/2019 1502   PROT 6.8 02/18/2019 0920   ALBUMIN 4.0 02/18/2019 0920   AST 29 02/18/2019 0920   ALT 41 02/18/2019 0920   ALKPHOS 96 02/18/2019 0920   BILITOT 0.4 02/18/2019 0920   GFRNONAA >60 03/12/2019 1502   GFRAA >60 03/12/2019 1502   Lab Results  Component Value Date   WBC 6.9 03/12/2019   NEUTROABS 3.9 03/12/2019   HGB 14.7 03/12/2019   HCT 42.7 03/12/2019   MCV 93.2 03/12/2019   PLT 224 03/12/2019     Assessment/Plan 1. Meningioma, recurrent of brain ([place])  2. Seizures [facility])  Mr. Armbrecht presents today with clinical syndrome c/w epilepsia partialis continua.  This is likely in part provoked by burden of recent radiotherapy.   We extensively discussed options moving forward for seizure management.  Although our preference was to admit to inpatient neurology service, he advocated strongly for aggressive outpatient management if possible.  We recommended the following: -HOLD remaining radiotherapy sessions for this week -Increase Keppra to 2000mg  BID -Increase dexamethasone to 4mg  daily -Begin course of Clonazepam, 0.5mg  BID standing -Con't Lamictal to 100mg  BID -Con't Dilantin 300mg  HS.  -Con't Ativan 1-2mg  PO prn breathrough/prolonged seizures  We appreciate the opportunity to participate in the care of [name].  He will follow up with [place] in person next Tuesday, 9/8.  If seizures do not improve at all over the weekend he will call us and our recommendation will be referral to ED for admission to neuro  service.   All questions were answered. The patient knows to call the clinic with any problems, questions or concerns. No barriers to learning were detected.  He will follow up with MRI brain 3 months following radiation therapy.  The total time spent in the encounter was 40 minutes and more than 50% was on counseling and review of test results   Alroy Dust  Harold Hedge, MD Medical Director of Neuro-Oncology [facility] [place] 03/21/19 9:02 AM

## 2019-03-22 ENCOUNTER — Ambulatory Visit: Payer: Medicaid Other

## 2019-03-22 NOTE — Telephone Encounter (Signed)
Received call back from patient this morning to review medications as outlined per Dr. Mickeal Skinner yesterday.  Reviewed meds, pt voiced understanding.  Pt asked if it was ok to have sparkling 'wine' or non-alcoholic beer.  Informed pt that as long as there is no alcohol in these beverages it is ok.  But absolutely no alcohol due to his medical condition and the medications he is on currently.  He voiced understanding and was able to repeat back to me medication instructions and the 'no alcohol' instructions.  No other questions or concerns.

## 2019-03-26 ENCOUNTER — Other Ambulatory Visit: Payer: Self-pay

## 2019-03-26 ENCOUNTER — Inpatient Hospital Stay (HOSPITAL_BASED_OUTPATIENT_CLINIC_OR_DEPARTMENT_OTHER): Payer: Medicaid Other | Admitting: Internal Medicine

## 2019-03-26 ENCOUNTER — Telehealth: Payer: Self-pay | Admitting: *Deleted

## 2019-03-26 ENCOUNTER — Ambulatory Visit: Payer: Medicaid Other

## 2019-03-26 VITALS — BP 121/83 | HR 80 | Temp 98.7°F | Resp 18 | Ht 69.0 in | Wt 191.7 lb

## 2019-03-26 DIAGNOSIS — D320 Benign neoplasm of cerebral meninges: Secondary | ICD-10-CM

## 2019-03-26 DIAGNOSIS — R569 Unspecified convulsions: Secondary | ICD-10-CM

## 2019-03-26 DIAGNOSIS — D329 Benign neoplasm of meninges, unspecified: Secondary | ICD-10-CM | POA: Diagnosis not present

## 2019-03-26 MED ORDER — LAMOTRIGINE 100 MG PO TABS: 100.0000 mg | ORAL_TABLET | Freq: Two times a day (BID) | ORAL | 3 refills | Status: DC

## 2019-03-26 MED ORDER — CLONAZEPAM 0.5 MG PO TABS: 0.5000 mg | ORAL_TABLET | Freq: Two times a day (BID) | ORAL | 1 refills | Status: DC

## 2019-03-26 MED ORDER — DEXAMETHASONE 2 MG PO TABS: 2.0000 mg | ORAL_TABLET | Freq: Every day | ORAL | 0 refills | Status: DC

## 2019-03-26 NOTE — Telephone Encounter (Signed)
Wife Rosaria Ferries called to validate instructions for Decadron dosing  And per Dr Mickeal Skinner patient is to continue Decadron 4 mg daily until this Saturday when he is to dose reduce to Decadron 2 mg daily until he returns to clinic on last day of treatment 04/05/2019.   Wife also wanted to let Dr Mickeal Skinner know that patient is very forgetful and does a good job appearing to remember things and tries very hard to remember but often forgets things and it is concerning.  She states that she has to physically hand him his medication so that he will remember to take it, he is unable to cook something in the kitchen because he will forget that he started cooking in the first place.  Advised that I would let the doctor know and that we could start typing out instructions for the patient from now own of any dosing instructions or changes.

## 2019-03-26 NOTE — Progress Notes (Signed)
[facility] at [place], [place] [address] [phone]   Interval Evaluation  Date of Service: 03/26/19 Patient Name: Joshua [name] Patient MRN: [id number] Patient DOB: March 14, 1961 Provider: [name], MD  Identifying Statement:  Joshua [name] is a 58 y.o. 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 Vertell Limber (WHO I) 01/10/19: Repeat craniotomy, debulking resection by Dr. Vertell Limber after tumor recurrence, seizures.  Path demonstrates islands of anaplasia c/w grade II/III. 02/21/19: Begins IMRT with Dr. Lisbeth Renshaw  Interval History:  Joshua [name] presents today for follow up given recent breakthrough seizures.  He describes significant improvement in seizure burden since our visit last week.  One breakthrough but self limited event was observed over the weekend. The foot has regained strength although it is still numb.  Only requiring a cane for long distance ambulation. He has not undergone RT since last Thursday, continues of 4mg  daily dexamethasone in addition to standing Klonopin and higher dose of Keppra.  H+P (02/05/19) Patient presents to review clinical course and care for his meningioma.  Initially he presented in 2014 with headache syndrome, was found to have tumor on imaging which was resected by Dr. Vertell Limber.  The patient was lost to follow up for ~5 years until he developed seizures, described as "twitching of left leg spreading to arm" this past month.  He required loads of Keppra and Dilantin to break events.  MRI demonstrated significant regrowth of the mass, and repeat craniotomy was performed on 01/10/19.  Small amount of residual tumor was visible on post-operative MRI.  Since surgery he has continued to experience numbness and clumsiness of his left leg, requiring cane for ambulation.  He is still pending home physical therapy  evaluation.  He has had "small" seizures, consisting of few seconds of shaking of left leg, occurring less than daily.  Continues on Keppra and Dilantin.    Medications: Current Outpatient Medications on File Prior to Visit  Medication Sig Dispense Refill  . aspirin EC 81 MG EC tablet Take 1 tablet (81 mg total) by mouth daily. 30 tablet 3  . clonazePAM (KLONOPIN) 0.5 MG tablet Take 1 tablet (0.5 mg total) by mouth 2 (two) times daily. Take 1 tablet twice per day for the next 7 days. 14 tablet 0  . dexamethasone (DECADRON) 2 MG tablet Take 2 tablets (4 mg total) by mouth daily. 30 tablet 0  . FIBER ADULT GUMMIES PO Take 1 tablet by mouth daily.    Marland Kitchen lamoTRIgine (LAMICTAL) 100 MG tablet Take 100mg  by mouth daily for 7 days, then increase to 100mg  twice per day 30 tablet 3  . levETIRAcetam (KEPPRA) 500 MG tablet Take 4 tablets (2,000 mg total) by mouth 2 (two) times daily. 180 tablet 3  . Melatonin 5 MG CHEW Chew 3 tablets by mouth at bedtime as needed (sleep).    . naproxen sodium (ALEVE) 220 MG tablet Take 440 mg by mouth as needed (pain or headache).    . phenytoin (DILANTIN) 300 MG ER capsule Take 1 capsule (300 mg total) by mouth at bedtime. 30 capsule 3  . prochlorperazine (COMPAZINE) 10 MG tablet Take 1 tablet (10 mg total) by mouth every 6 (six) hours as needed for nausea or vomiting. 30 tablet 1  . triamcinolone cream (KENALOG) 0.1 % Apply 1 application topically 2 (two) times daily. 30 g 1  . vitamin C (ASCORBIC  ACID) 500 MG tablet Take 500 mg by mouth daily.    Marland Kitchen LORazepam (ATIVAN) 1 MG tablet Take 1 tablet (1 mg total) by mouth every 8 (eight) hours as needed for seizure (for breakthrough seizures). (Patient not taking: Reported on 03/26/2019) 12 tablet 0   No current facility-administered medications on file prior to visit.     Allergies: No Known Allergies Past Medical History:  Past Medical History:  Diagnosis Date  . Brain tumor ([place]) 02/20/2013   brain tumor removed in March  2014, Dr Donald Pore  . Dizziness   . Enlarged prostate   . Headache(784.0)    scattered  . Meningioma [facility])    Past Surgical History:  Past Surgical History:  Procedure Laterality Date  . APPLICATION OF CRANIAL NAVIGATION N/A 01/10/2019   Procedure: APPLICATION OF CRANIAL NAVIGATION;  Surgeon: Erline Levine, MD;  Location: [facility];  Service: Neurosurgery;  Laterality: N/A;  . CATARACT EXTRACTION Right   . CRANIOTOMY Right 11/06/2012   Procedure: CRANIOTOMY TUMOR EXCISION;  Surgeon: Erline Levine, MD;  Location: [facility];  Service: Neurosurgery;  Laterality: Right;  Right Parasagittal craniotomy for meningioma with Stealth  . CRANIOTOMY Right 01/10/2019   Procedure: Right Parasagittal Craniotomy for Tumor;  Surgeon: Erline Levine, MD;  Location: [facility];  Service: Neurosurgery;  Laterality: Right;  Right parasagittal craniotomy for tumor  . ESOPHAGOGASTRODUODENOSCOPY    . right knee arthroscopy     Social History:  Social History   Socioeconomic History  . Marital status: Married    Spouse name: Not on file  . Number of children: Not on file  . Years of education: Not on file  . Highest education level: Not on file  Occupational History  . Not on file  Social Needs  . Financial resource strain: Not on file  . Food insecurity    Worry: Not on file    Inability: Not on file  . Transportation needs    Medical: No    Non-medical: No  Tobacco Use  . Smoking status: Current Some Day Smoker    Types: Cigarettes  . Smokeless tobacco: Never Used  . Tobacco comment: hasn't smoked in a week  Substance and Sexual Activity  . Alcohol use: Yes    Alcohol/week: 8.0 standard drinks    Types: 6 Cans of beer, 2 Shots of liquor per week    Comment: occasionally  . Drug use: No  . Sexual activity: Yes  Lifestyle  . Physical activity    Days per week: Not on file    Minutes per session: Not on file  . Stress: Not on file  Relationships  . Social Herbalist on phone: Not on file     Gets together: Not on file    Attends religious service: Not on file    Active member of club or organization: Not on file    Attends meetings of clubs or organizations: Not on file    Relationship status: Not on file  . Intimate partner violence    Fear of current or ex partner: Not on file    Emotionally abused: Not on file    Physically abused: Not on file    Forced sexual activity: Not on file  Other Topics Concern  . Not on file  Social History Narrative  . Not on file   Family History:  Family History  Problem Relation Age of Onset  . Hypertension Mother   . Hypertension Father  Review of Systems: Constitutional: Denies fevers, chills or abnormal weight loss Eyes: Denies blurriness of vision Ears, nose, mouth, throat, and face: Denies mucositis or sore throat Respiratory: Denies cough, dyspnea or wheezes Cardiovascular: Denies palpitation, chest discomfort or lower extremity swelling Gastrointestinal:  Denies nausea, constipation, diarrhea GU: Denies dysuria or incontinence Skin: Denies abnormal skin rashes Neurological: Per HPI Musculoskeletal: Denies joint pain, back or neck discomfort. No decrease in ROM Behavioral/Psych: Denies anxiety, disturbance in thought content, and mood instability  Physical Exam: Vitals:   03/26/19 1004  BP: 121/83  Pulse: 80  Resp: 18  Temp: 98.7 F (37.1 C)  SpO2: 100%   KPS: 80. General: Alert, cooperative, pleasant, in no acute distress Head: Craniotomy scar noted, dry and intact. EENT: No conjunctival injection or scleral icterus. Oral mucosa moist Lungs: Resp effort normal Cardiac: Regular rate and rhythm Abdomen: Soft, non-distended abdomen Skin: No rashes cyanosis or petechiae. Extremities: No clubbing or edema  Neurologic Exam: Mental Status: Awake, alert, attentive to examiner. Oriented to self and environment. Language is fluent with intact comprehension.  Cranial Nerves: Visual acuity is grossly normal.  Visual fields are full. Extra-ocular movements intact. No ptosis. Face is symmetric, tongue midline. Motor: Tone and bulk are normal.Power is impaired in left leg, 4+/5 distally. Reflexes are symmetric, no pathologic reflexes present. Impaired heel to shin left leg Sensory: Diminished to light touch and temp left lower leg Gait: Independent  Labs: I have reviewed the data as listed    Component Value Date/Time   NA 140 03/12/2019 1502   K 4.1 03/12/2019 1502   CL 104 03/12/2019 1502   CO2 27 03/12/2019 1502   GLUCOSE 145 (H) 03/12/2019 1502   BUN 14 03/12/2019 1502   CREATININE 0.83 03/12/2019 1502   CALCIUM 9.3 03/12/2019 1502   PROT 6.8 02/18/2019 0920   ALBUMIN 4.0 02/18/2019 0920   AST 29 02/18/2019 0920   ALT 41 02/18/2019 0920   ALKPHOS 96 02/18/2019 0920   BILITOT 0.4 02/18/2019 0920   GFRNONAA >60 03/12/2019 1502   GFRAA >60 03/12/2019 1502   Lab Results  Component Value Date   WBC 6.9 03/12/2019   NEUTROABS 3.9 03/12/2019   HGB 14.7 03/12/2019   HCT 42.7 03/12/2019   MCV 93.2 03/12/2019   PLT 224 03/12/2019     Assessment/Plan 1. Meningioma, recurrent of brain ([place])  2. 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    We recommended the following: -Plan to resume radiotherapy sessions tomorrow, 03/27/19 -Con't Keppra to 2000mg  BID -Con't Clonazepam, 0.5mg  BID -Con't Lamictal 100mg  BID -Con't Dilantin 300mg  HS.  -Decrease dexamethasone to 2mg  daily starting this weekend, if tolerated -Con't Ativan 1-2mg  PO prn breathrough/prolonged seizures  We appreciate the opportunity to participate in the care of Joshua [name].  He will follow up with [place] in person prior to final RT session on 04/05/19, or sooner if needed.   All questions were answered. The patient knows to call the clinic with any problems, questions or concerns. No barriers to learning were detected.  He will follow up with MRI brain  3 months following radiation therapy.  The total time spent in the encounter was 25 minutes and more than 50% was on counseling and review of test results   [name], MD Medical Director of Neuro-Oncology [facility] 03/26/19 4:30 PM

## 2019-03-27 ENCOUNTER — Ambulatory Visit
Admission: RE | Admit: 2019-03-27 | Discharge: 2019-03-27 | Disposition: A | Discharge status: Home / Self Care | Payer: Medicaid Other | Source: Ambulatory Visit | Attending: Radiation Oncology | Admitting: Radiation Oncology

## 2019-03-27 ENCOUNTER — Other Ambulatory Visit: Payer: Self-pay

## 2019-03-27 ENCOUNTER — Telehealth: Payer: Self-pay | Admitting: Internal Medicine

## 2019-03-27 DIAGNOSIS — Z510 Encounter for antineoplastic radiation therapy: Secondary | ICD-10-CM | POA: Diagnosis not present

## 2019-03-27 NOTE — Telephone Encounter (Signed)
Per 9/8 los appts were already scheduled by the MD nurse.

## 2019-03-28 ENCOUNTER — Other Ambulatory Visit: Payer: Self-pay

## 2019-03-28 ENCOUNTER — Ambulatory Visit
Admission: RE | Admit: 2019-03-28 | Discharge: 2019-03-28 | Disposition: A | Discharge status: Home / Self Care | Payer: Medicaid Other | Source: Ambulatory Visit | Attending: Radiation Oncology | Admitting: Radiation Oncology

## 2019-03-28 DIAGNOSIS — Z510 Encounter for antineoplastic radiation therapy: Secondary | ICD-10-CM | POA: Diagnosis not present

## 2019-03-29 ENCOUNTER — Other Ambulatory Visit: Payer: Self-pay

## 2019-03-29 ENCOUNTER — Ambulatory Visit
Admission: RE | Admit: 2019-03-29 | Discharge: 2019-03-29 | Disposition: A | Discharge status: Home / Self Care | Payer: Medicaid Other | Source: Ambulatory Visit | Attending: Radiation Oncology | Admitting: Radiation Oncology

## 2019-03-29 DIAGNOSIS — Z510 Encounter for antineoplastic radiation therapy: Secondary | ICD-10-CM | POA: Diagnosis not present

## 2019-04-01 ENCOUNTER — Ambulatory Visit
Admission: RE | Admit: 2019-04-01 | Discharge: 2019-04-01 | Disposition: A | Discharge status: Home / Self Care | Payer: Medicaid Other | Source: Ambulatory Visit | Attending: Radiation Oncology | Admitting: Radiation Oncology

## 2019-04-01 ENCOUNTER — Other Ambulatory Visit: Payer: Self-pay

## 2019-04-01 DIAGNOSIS — Z510 Encounter for antineoplastic radiation therapy: Secondary | ICD-10-CM | POA: Diagnosis not present

## 2019-04-02 ENCOUNTER — Ambulatory Visit
Admission: RE | Admit: 2019-04-02 | Discharge: 2019-04-02 | Disposition: A | Discharge status: Home / Self Care | Payer: Medicaid Other | Source: Ambulatory Visit | Attending: Radiation Oncology | Admitting: Radiation Oncology

## 2019-04-02 ENCOUNTER — Other Ambulatory Visit: Payer: Self-pay

## 2019-04-02 DIAGNOSIS — Z510 Encounter for antineoplastic radiation therapy: Secondary | ICD-10-CM | POA: Diagnosis not present

## 2019-04-03 ENCOUNTER — Other Ambulatory Visit: Payer: Self-pay

## 2019-04-03 ENCOUNTER — Ambulatory Visit: Payer: Medicaid Other

## 2019-04-03 ENCOUNTER — Ambulatory Visit
Admission: RE | Admit: 2019-04-03 | Discharge: 2019-04-03 | Disposition: A | Discharge status: Home / Self Care | Payer: Medicaid Other | Source: Ambulatory Visit | Attending: Radiation Oncology | Admitting: Radiation Oncology

## 2019-04-03 DIAGNOSIS — Z510 Encounter for antineoplastic radiation therapy: Secondary | ICD-10-CM | POA: Diagnosis not present

## 2019-04-04 ENCOUNTER — Other Ambulatory Visit: Payer: Self-pay | Admitting: *Deleted

## 2019-04-04 ENCOUNTER — Ambulatory Visit
Admission: RE | Admit: 2019-04-04 | Discharge: 2019-04-04 | Disposition: A | Discharge status: Home / Self Care | Payer: Medicaid Other | Source: Ambulatory Visit | Attending: Radiation Oncology | Admitting: Radiation Oncology

## 2019-04-04 ENCOUNTER — Other Ambulatory Visit: Payer: Self-pay

## 2019-04-04 ENCOUNTER — Ambulatory Visit: Payer: Medicaid Other

## 2019-04-04 DIAGNOSIS — Z510 Encounter for antineoplastic radiation therapy: Secondary | ICD-10-CM | POA: Diagnosis not present

## 2019-04-04 DIAGNOSIS — D320 Benign neoplasm of cerebral meninges: Secondary | ICD-10-CM

## 2019-04-05 ENCOUNTER — Other Ambulatory Visit: Payer: Self-pay

## 2019-04-05 ENCOUNTER — Ambulatory Visit: Payer: Medicaid Other

## 2019-04-05 ENCOUNTER — Ambulatory Visit
Admission: RE | Admit: 2019-04-05 | Discharge: 2019-04-05 | Disposition: A | Discharge status: Home / Self Care | Payer: Medicaid Other | Source: Ambulatory Visit | Attending: Radiation Oncology | Admitting: Radiation Oncology

## 2019-04-05 ENCOUNTER — Inpatient Hospital Stay (HOSPITAL_BASED_OUTPATIENT_CLINIC_OR_DEPARTMENT_OTHER): Payer: Medicaid Other | Admitting: Internal Medicine

## 2019-04-05 ENCOUNTER — Inpatient Hospital Stay: Payer: Medicaid Other

## 2019-04-05 ENCOUNTER — Encounter: Payer: Self-pay | Admitting: Radiation Oncology

## 2019-04-05 VITALS — BP 131/84 | HR 89 | Temp 98.7°F | Resp 18 | Ht 69.0 in | Wt 195.4 lb

## 2019-04-05 DIAGNOSIS — D329 Benign neoplasm of meninges, unspecified: Secondary | ICD-10-CM | POA: Diagnosis not present

## 2019-04-05 DIAGNOSIS — R569 Unspecified convulsions: Secondary | ICD-10-CM

## 2019-04-05 DIAGNOSIS — Z510 Encounter for antineoplastic radiation therapy: Secondary | ICD-10-CM | POA: Diagnosis not present

## 2019-04-05 DIAGNOSIS — D320 Benign neoplasm of cerebral meninges: Secondary | ICD-10-CM

## 2019-04-05 MED ORDER — PHENYTOIN SODIUM EXTENDED 100 MG PO CAPS: 100.0000 mg | ORAL_CAPSULE | Freq: Every day | ORAL | 3 refills | Status: DC

## 2019-04-05 MED ORDER — DEXAMETHASONE 2 MG PO TABS: 1.0000 mg | ORAL_TABLET | Freq: Every day | ORAL | 0 refills | Status: DC

## 2019-04-05 NOTE — Progress Notes (Signed)
[facility] at [place], [place] [address] [phone]   Interval Evaluation  Date of Service: 04/05/19 Patient Name: Joshua [name] Patient MRN: [id number] Patient DOB: 13-Jun-1961 Provider: [name], MD  Identifying Statement:  Joshua [name] is a 58 y.o. 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 Vertell Limber (WHO I) 01/10/19: Repeat craniotomy, debulking resection by Dr. Vertell Limber after tumor recurrence, seizures.  Path demonstrates islands of anaplasia c/w grade II/III. 02/21/19: Begins IMRT with Dr. Lisbeth Renshaw  Interval History:  Joshua [name] presents today for follow up after recent medication changes due to status epilepticus.  He describes continued improvement in seizure burden in the past two weeks.  One additional breakthrough event Wednesday which did not evolve into convulsion, sensory only. The foot is still numb and a little weak.  Last day of RT today, continues on 2mg  daily dexamethasone.  H+P (02/05/19) Patient presents to review clinical course and care for his meningioma.  Initially he presented in 2014 with headache syndrome, was found to have tumor on imaging which was resected by Dr. Vertell Limber.  The patient was lost to follow up for ~5 years until he developed seizures, described as "twitching of left leg spreading to arm" this past month.  He required loads of Keppra and Dilantin to break events.  MRI demonstrated significant regrowth of the mass, and repeat craniotomy was performed on 01/10/19.  Small amount of residual tumor was visible on post-operative MRI.  Since surgery he has continued to experience numbness and clumsiness of his left leg, requiring cane for ambulation.  He is still pending home physical therapy evaluation.  He has had "small" seizures, consisting of few seconds of shaking of left leg, occurring less  than daily.  Continues on Keppra and Dilantin.    Medications: Current Outpatient Medications on File Prior to Visit  Medication Sig Dispense Refill  . aspirin EC 81 MG EC tablet Take 1 tablet (81 mg total) by mouth daily. 30 tablet 3  . clonazePAM (KLONOPIN) 0.5 MG tablet Take 1 tablet (0.5 mg total) by mouth 2 (two) times daily. Take 1 tablet twice per day for the next 7 days. 60 tablet 1  . dexamethasone (DECADRON) 2 MG tablet Take 1 tablet (2 mg total) by mouth daily. 30 tablet 0  . FIBER ADULT GUMMIES PO Take 1 tablet by mouth daily.    Marland Kitchen lamoTRIgine (LAMICTAL) 100 MG tablet Take 1 tablet (100 mg total) by mouth 2 (two) times daily. 30 tablet 3  . levETIRAcetam (KEPPRA) 500 MG tablet Take 4 tablets (2,000 mg total) by mouth 2 (two) times daily. 180 tablet 3  . LORazepam (ATIVAN) 1 MG tablet Take 1 tablet (1 mg total) by mouth every 8 (eight) hours as needed for seizure (for breakthrough seizures). (Patient not taking: Reported on 03/26/2019) 12 tablet 0  . Melatonin 5 MG CHEW Chew 3 tablets by mouth at bedtime as needed (sleep).    . naproxen sodium (ALEVE) 220 MG tablet Take 440 mg by mouth as needed (pain or headache).    . phenytoin (DILANTIN) 300 MG ER capsule Take 1 capsule (300 mg total) by mouth at bedtime. 30 capsule 3  . prochlorperazine (COMPAZINE) 10 MG tablet Take 1 tablet (10 mg total) by mouth every 6 (six) hours as needed for nausea or vomiting. 30 tablet 1  . triamcinolone cream (KENALOG) 0.1 %  Apply 1 application topically 2 (two) times daily. 30 g 1  . vitamin C (ASCORBIC ACID) 500 MG tablet Take 500 mg by mouth daily.     No current facility-administered medications on file prior to visit.     Allergies: No Known Allergies Past Medical History:  Past Medical History:  Diagnosis Date  . Brain tumor ([place]) 02/20/2013   brain tumor removed in March 2014, Dr Donald Pore  . Dizziness   . Enlarged prostate   . Headache(784.0)    scattered  . Meningioma [facility])    Past  Surgical History:  Past Surgical History:  Procedure Laterality Date  . APPLICATION OF CRANIAL NAVIGATION N/A 01/10/2019   Procedure: APPLICATION OF CRANIAL NAVIGATION;  Surgeon: Erline Levine, MD;  Location: [facility];  Service: Neurosurgery;  Laterality: N/A;  . CATARACT EXTRACTION Right   . CRANIOTOMY Right 11/06/2012   Procedure: CRANIOTOMY TUMOR EXCISION;  Surgeon: Erline Levine, MD;  Location: [place] [facility];  Service: Neurosurgery;  Laterality: Right;  Right Parasagittal craniotomy for meningioma with Stealth  . CRANIOTOMY Right 01/10/2019   Procedure: Right Parasagittal Craniotomy for Tumor;  Surgeon: Erline Levine, MD;  Location: [facility] [place];  Service: Neurosurgery;  Laterality: Right;  Right parasagittal craniotomy for tumor  . ESOPHAGOGASTRODUODENOSCOPY    . right knee arthroscopy     Social History:  Social History   Socioeconomic History  . Marital status: Married    Spouse name: Not on file  . Number of children: Not on file  . Years of education: Not on file  . Highest education level: Not on file  Occupational History  . Not on file  Social Needs  . Financial resource strain: Not on file  . Food insecurity    Worry: Not on file    Inability: Not on file  . Transportation needs    Medical: No    Non-medical: No  Tobacco Use  . Smoking status: Current Some Day Smoker    Types: Cigarettes  . Smokeless tobacco: Never Used  . Tobacco comment: hasn't smoked in a week  Substance and Sexual Activity  . Alcohol use: Yes    Alcohol/week: 8.0 standard drinks    Types: 6 Cans of beer, 2 Shots of liquor per week    Comment: occasionally  . Drug use: No  . Sexual activity: Yes  Lifestyle  . Physical activity    Days per week: Not on file    Minutes per session: Not on file  . Stress: Not on file  Relationships  . Social Herbalist on phone: Not on file    Gets together: Not on file    Attends religious service: Not on file    Active member of club or organization:  Not on file    Attends meetings of clubs or organizations: Not on file    Relationship status: Not on file  . Intimate partner violence    Fear of current or ex partner: Not on file    Emotionally abused: Not on file    Physically abused: Not on file    Forced sexual activity: Not on file  Other Topics Concern  . Not on file  Social History Narrative  . Not on file   Family History:  Family History  Problem Relation Age of Onset  . Hypertension Mother   . Hypertension Father     Review of Systems: Constitutional: Denies fevers, chills or abnormal weight loss Eyes: Denies blurriness of vision Ears, nose,  mouth, throat, and face: Denies mucositis or sore throat Respiratory: Denies cough, dyspnea or wheezes Cardiovascular: Denies palpitation, chest discomfort or lower extremity swelling Gastrointestinal:  Denies nausea, constipation, diarrhea GU: Denies dysuria or incontinence Skin: Denies abnormal skin rashes Neurological: Per HPI Musculoskeletal: Denies joint pain, back or neck discomfort. No decrease in ROM Behavioral/Psych: Denies anxiety, disturbance in thought content, and mood instability  Physical Exam: There were no vitals filed for this visit. KPS: 80. General: Alert, cooperative, pleasant, in no acute distress Head: Craniotomy scar noted, dry and intact. EENT: No conjunctival injection or scleral icterus. Oral mucosa moist Lungs: Resp effort normal Cardiac: Regular rate and rhythm Abdomen: Soft, non-distended abdomen Skin: No rashes cyanosis or petechiae. Extremities: No clubbing or edema  Neurologic Exam: Mental Status: Awake, alert, attentive to examiner. Oriented to self and environment. Language is fluent with intact comprehension.  Cranial Nerves: Visual acuity is grossly normal. Visual fields are full. Extra-ocular movements intact. No ptosis. Face is symmetric, tongue midline. Motor: Tone and bulk are normal.Power is impaired in left leg, 4+/5 distally.  Reflexes are symmetric, no pathologic reflexes present. Impaired heel to shin left leg Sensory: Diminished to light touch and temp left lower leg Gait: Independent  Labs: I have reviewed the data as listed    Component Value Date/Time   NA 140 03/12/2019 1502   K 4.1 03/12/2019 1502   CL 104 03/12/2019 1502   CO2 27 03/12/2019 1502   GLUCOSE 145 (H) 03/12/2019 1502   BUN 14 03/12/2019 1502   CREATININE 0.83 03/12/2019 1502   CALCIUM 9.3 03/12/2019 1502   PROT 6.8 02/18/2019 0920   ALBUMIN 4.0 02/18/2019 0920   AST 29 02/18/2019 0920   ALT 41 02/18/2019 0920   ALKPHOS 96 02/18/2019 0920   BILITOT 0.4 02/18/2019 0920   GFRNONAA >60 03/12/2019 1502   GFRAA >60 03/12/2019 1502   Lab Results  Component Value Date   WBC 6.9 03/12/2019   NEUTROABS 3.9 03/12/2019   HGB 14.7 03/12/2019   HCT 42.7 03/12/2019   MCV 93.2 03/12/2019   PLT 224 03/12/2019     Assessment/Plan 1. Meningioma, recurrent of brain ([place])  2. Seizures [facility])  Joshua [name] is clinically stable today, now having completed IMRT for recurrent meningioma.  We recommended the following: -Con't Keppra to 2000mg  BID -Con't Clonazepam, 0.5mg  BID -Con't Lamictal 100mg  BID -Decrease dexamethasone to 1mg  daily x1 week, then STOP -After DCing decadron, decrease Dilantin to 100mg  HS.  -Con't Ativan 1-2mg  PO prn breathrough/prolonged seizures  He understands that PHT induces metabolism of Lamictal, and that by decreasing PHT, Lamictal level will increase.  We appreciate the opportunity to participate in the care of Joshua [name].  He will follow up with [place] in 1 month to continue gradual AED taper.  All questions were answered. The patient knows to call the clinic with any problems, questions or concerns. No barriers to learning were detected.  He will follow up with MRI brain 3 months following radiation therapy.  The total time spent in the encounter was 25 minutes and more than 50% was on counseling and  review of test results   [name], MD Medical Director of Neuro-Oncology [facility] [place] 04/05/19 9:28 AM

## 2019-04-08 ENCOUNTER — Ambulatory Visit: Payer: Medicaid Other

## 2019-04-08 ENCOUNTER — Telehealth: Payer: Self-pay | Admitting: Internal Medicine

## 2019-04-08 NOTE — Telephone Encounter (Signed)
Scheduled appt per 9/18 los. ° °Spoke with patient and he is aware of his appt date and time. °

## 2019-04-15 ENCOUNTER — Other Ambulatory Visit: Payer: Self-pay | Admitting: Internal Medicine

## 2019-04-15 MED ORDER — LORAZEPAM 1 MG PO TABS: 1.0000 mg | ORAL_TABLET | Freq: Three times a day (TID) | ORAL | 0 refills | Status: DC | PRN

## 2019-04-22 ENCOUNTER — Inpatient Hospital Stay: Payer: Medicaid Other | Attending: Internal Medicine | Admitting: Internal Medicine

## 2019-04-22 ENCOUNTER — Other Ambulatory Visit: Payer: Self-pay

## 2019-04-22 ENCOUNTER — Telehealth: Payer: Self-pay | Admitting: *Deleted

## 2019-04-22 VITALS — BP 133/88 | HR 66 | Temp 98.5°F | Resp 17 | Ht 69.0 in | Wt 196.9 lb

## 2019-04-22 DIAGNOSIS — Z79899 Other long term (current) drug therapy: Secondary | ICD-10-CM | POA: Diagnosis not present

## 2019-04-22 DIAGNOSIS — F17210 Nicotine dependence, cigarettes, uncomplicated: Secondary | ICD-10-CM | POA: Insufficient documentation

## 2019-04-22 DIAGNOSIS — R569 Unspecified convulsions: Secondary | ICD-10-CM | POA: Diagnosis not present

## 2019-04-22 DIAGNOSIS — D320 Benign neoplasm of cerebral meninges: Secondary | ICD-10-CM | POA: Insufficient documentation

## 2019-04-22 MED ORDER — CLONAZEPAM 0.5 MG PO TABS: 0.5000 mg | ORAL_TABLET | Freq: Two times a day (BID) | ORAL | 1 refills | Status: DC

## 2019-04-22 MED ORDER — PHENYTOIN SODIUM EXTENDED 100 MG PO CAPS: 300.0000 mg | ORAL_CAPSULE | Freq: Every day | ORAL | 3 refills | Status: DC

## 2019-04-22 NOTE — Progress Notes (Signed)
[facility] at [address] [place], [place] [address] [phone]   Interval Evaluation  Date of Service: 04/22/19 Patient Name: Joshua [name] Patient MRN: [id number] Patient DOB: 22-Mar-1961 Provider: [name], MD  Identifying Statement:  LELDON NORMIL is a 58 y.o. 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 Vertell Limber (WHO I) 01/10/19: Repeat craniotomy, debulking resection by Dr. Vertell Limber after tumor recurrence, seizures.  Path demonstrates islands of anaplasia c/w grade II/III. 02/21/19: Begins IMRT with Dr. Lisbeth Renshaw  Interval History:  [name] presents today for follow up after recent breakthrough seizures.  Over the past week, he describes several episodes of left leg shaking seizure type, and 2 episodes of more generalized left shaking with impaired awareness and post event weakness.  The foot is still somewhat weak post-ictally.  Medications were reviewed in detail with the patient and his wife.    H+P (02/05/19) Patient presents to review clinical course and care for his meningioma.  Initially he presented in 2014 with headache syndrome, was found to have tumor on imaging which was resected by Dr. Vertell Limber.  The patient was lost to follow up for ~5 years until he developed seizures, described as "twitching of left leg spreading to arm" this past month.  He required loads of Keppra and Dilantin to break events.  MRI demonstrated significant regrowth of the mass, and repeat craniotomy was performed on 01/10/19.  Small amount of residual tumor was visible on post-operative MRI.  Since surgery he has continued to experience numbness and clumsiness of his left leg, requiring cane for ambulation.  He is still pending home physical therapy evaluation.  He has had "small" seizures, consisting of few seconds of shaking of left leg, occurring less  than daily.  Continues on Keppra and Dilantin.    Medications: Current Outpatient Medications on File Prior to Visit  Medication Sig Dispense Refill  . aspirin EC 81 MG EC tablet Take 1 tablet (81 mg total) by mouth daily. 30 tablet 3  . clonazePAM (KLONOPIN) 0.5 MG tablet Take 1 tablet (0.5 mg total) by mouth 2 (two) times daily. Take 1 tablet twice per day for the next 7 days. 60 tablet 1  . dexamethasone (DECADRON) 2 MG tablet Take 0.5 tablets (1 mg total) by mouth daily. 30 tablet 0  . FIBER ADULT GUMMIES PO Take 1 tablet by mouth daily.    Marland Kitchen lamoTRIgine (LAMICTAL) 100 MG tablet Take 1 tablet (100 mg total) by mouth 2 (two) times daily. 30 tablet 3  . levETIRAcetam (KEPPRA) 500 MG tablet Take 4 tablets (2,000 mg total) by mouth 2 (two) times daily. 180 tablet 3  . LORazepam (ATIVAN) 1 MG tablet Take 1 tablet (1 mg total) by mouth every 8 (eight) hours as needed for seizure (for breakthrough seizures). 12 tablet 0  . Melatonin 5 MG CHEW Chew 3 tablets by mouth at bedtime as needed (sleep).    . naproxen sodium (ALEVE) 220 MG tablet Take 440 mg by mouth as needed (pain or headache).    . phenytoin (DILANTIN) 100 MG ER capsule Take 1 capsule (100 mg total) by mouth at bedtime. 60 capsule 3  . prochlorperazine (COMPAZINE) 10 MG tablet Take 1 tablet (10 mg total) by mouth every 6 (six) hours as needed for nausea or vomiting. 30 tablet 1  . triamcinolone cream (KENALOG) 0.1 % Apply 1 application topically 2 (  two) times daily. 30 g 1  . vitamin C (ASCORBIC ACID) 500 MG tablet Take 500 mg by mouth daily.     No current facility-administered medications on file prior to visit.     Allergies: No Known Allergies Past Medical History:  Past Medical History:  Diagnosis Date  . Brain tumor ([place]) 02/20/2013   brain tumor removed in March 2014, Dr Donald Pore  . Dizziness   . Enlarged prostate   . Headache(784.0)    scattered  . Meningioma [facility])    Past Surgical History:  Past Surgical History:   Procedure Laterality Date  . APPLICATION OF CRANIAL NAVIGATION N/A 01/10/2019   Procedure: APPLICATION OF CRANIAL NAVIGATION;  Surgeon: Erline Levine, MD;  Location: [place];  Service: Neurosurgery;  Laterality: N/A;  . CATARACT EXTRACTION Right   . CRANIOTOMY Right 11/06/2012   Procedure: CRANIOTOMY TUMOR EXCISION;  Surgeon: Erline Levine, MD;  Location: [place] [facility];  Service: Neurosurgery;  Laterality: Right;  Right Parasagittal craniotomy for meningioma with Stealth  . CRANIOTOMY Right 01/10/2019   Procedure: Right Parasagittal Craniotomy for Tumor;  Surgeon: Erline Levine, MD;  Location: [place];  Service: Neurosurgery;  Laterality: Right;  Right parasagittal craniotomy for tumor  . ESOPHAGOGASTRODUODENOSCOPY    . right knee arthroscopy     Social History:  Social History   Socioeconomic History  . Marital status: Married    Spouse name: Not on file  . Number of children: Not on file  . Years of education: Not on file  . Highest education level: Not on file  Occupational History  . Not on file  Social Needs  . Financial resource strain: Not on file  . Food insecurity    Worry: Not on file    Inability: Not on file  . Transportation needs    Medical: No    Non-medical: No  Tobacco Use  . Smoking status: Current Some Day Smoker    Types: Cigarettes  . Smokeless tobacco: Never Used  . Tobacco comment: hasn't smoked in a week  Substance and Sexual Activity  . Alcohol use: Yes    Alcohol/week: 8.0 standard drinks    Types: 6 Cans of beer, 2 Shots of liquor per week    Comment: occasionally  . Drug use: No  . Sexual activity: Yes  Lifestyle  . Physical activity    Days per week: Not on file    Minutes per session: Not on file  . Stress: Not on file  Relationships  . Social Herbalist on phone: Not on file    Gets together: Not on file    Attends religious service: Not on file    Active member of club or organization: Not on file    Attends meetings of clubs or  organizations: Not on file    Relationship status: Not on file  . Intimate partner violence    Fear of current or ex partner: Not on file    Emotionally abused: Not on file    Physically abused: Not on file    Forced sexual activity: Not on file  Other Topics Concern  . Not on file  Social History Narrative  . Not on file   Family History:  Family History  Problem Relation Age of Onset  . Hypertension Mother   . Hypertension Father     Review of Systems: Constitutional: Denies fevers, chills or abnormal weight loss Eyes: Denies blurriness of vision Ears, nose, mouth, throat, and face: Denies  mucositis or sore throat Respiratory: Denies cough, dyspnea or wheezes Cardiovascular: Denies palpitation, chest discomfort or lower extremity swelling Gastrointestinal:  Denies nausea, constipation, diarrhea GU: Denies dysuria or incontinence Skin: Denies abnormal skin rashes Neurological: Per HPI Musculoskeletal: Denies joint pain, back or neck discomfort. No decrease in ROM Behavioral/Psych: Denies anxiety, disturbance in thought content, and mood instability  Physical Exam: Vitals:   04/22/19 0926  BP: 133/88  Pulse: 66  Resp: 17  Temp: 98.5 F (36.9 C)  SpO2: 99%   KPS: 80. General: Alert, cooperative, pleasant, in no acute distress Head: Craniotomy scar noted, dry and intact. EENT: No conjunctival injection or scleral icterus. Oral mucosa moist Lungs: Resp effort normal Cardiac: Regular rate and rhythm Abdomen: Soft, non-distended abdomen Skin: No rashes cyanosis or petechiae. Extremities: No clubbing or edema  Neurologic Exam: Mental Status: Awake, alert, attentive to examiner. Oriented to self and environment. Language is fluent with intact comprehension.  Cranial Nerves: Visual acuity is grossly normal. Visual fields are full. Extra-ocular movements intact. No ptosis. Face is symmetric, tongue midline. Motor: Tone and bulk are normal.Power is impaired in left leg,  4+/5 distally. Reflexes are symmetric, no pathologic reflexes present. Impaired heel to shin left leg Sensory: Diminished to light touch and temp left lower leg Gait: Independent  Labs: I have reviewed the data as listed    Component Value Date/Time   NA 140 03/12/2019 1502   K 4.1 03/12/2019 1502   CL 104 03/12/2019 1502   CO2 27 03/12/2019 1502   GLUCOSE 145 (H) 03/12/2019 1502   BUN 14 03/12/2019 1502   CREATININE 0.83 03/12/2019 1502   CALCIUM 9.3 03/12/2019 1502   PROT 6.8 02/18/2019 0920   ALBUMIN 4.0 02/18/2019 0920   AST 29 02/18/2019 0920   ALT 41 02/18/2019 0920   ALKPHOS 96 02/18/2019 0920   BILITOT 0.4 02/18/2019 0920   GFRNONAA >60 03/12/2019 1502   GFRAA >60 03/12/2019 1502   Lab Results  Component Value Date   WBC 6.9 03/12/2019   NEUTROABS 3.9 03/12/2019   HGB 14.7 03/12/2019   HCT 42.7 03/12/2019   MCV 93.2 03/12/2019   PLT 224 03/12/2019     Assessment/Plan 1. Meningioma, recurrent of brain ([place])  2. Seizures [facility])  Mr. Perito presents today with breakthrough focal seizures, likely related to improper abrupt cessation of benzodiazepine.  Patient had taken extra doses of Clonazepam to treat small breakthrough focal seizures, and ran out of meds early (without ability to refill controlled substance through pharmacy)  We recommended the following: -Increase Keppra to 2000mg  BID -Restart Clonazepam 0.5mg  BID -Continue Lamictal 100mg  BID -Increase PHT back to 300mg  HS, including one time 500mg  NOW PO loading dose -Continue to withhold dexamethasone -No further PRN benzos breathrough seizures due to improper use.  Due to complexity of this decision making and multiple, "moving parts" we will follow up with a phone visit in ~1 week or sooner as needed.  Still plan to draw-down PHT or Keppra soon.  We appreciate the opportunity to participate in the care of MIKAYEL BLOEMER.    All questions were answered. The patient knows to call the clinic with  any problems, questions or concerns. No barriers to learning were detected.  He will follow up with MRI brain 3 months following radiation therapy.  The total time spent in the encounter was 40 minutes and more than 50% was on counseling and review of test results   [name], MD Medical Director of Neuro-Oncology  [place] [facility] at [facility] 04/22/19 9:22 AM

## 2019-04-22 NOTE — Telephone Encounter (Signed)
Patient called to advise that he has already ran out of the Ativan since last refill.  He states his breakthrough seizures are getting worse and more frequent.    Scheduled in person follow up with Dr Mickeal Skinner today to evaluate.

## 2019-04-23 ENCOUNTER — Telehealth: Payer: Self-pay | Admitting: Internal Medicine

## 2019-04-23 NOTE — Telephone Encounter (Signed)
Scheduled appt per 10/5 los.  Spoke with pt and he is aware of the appt date and time.

## 2019-04-30 ENCOUNTER — Inpatient Hospital Stay (HOSPITAL_BASED_OUTPATIENT_CLINIC_OR_DEPARTMENT_OTHER): Payer: Medicaid Other | Admitting: Internal Medicine

## 2019-04-30 DIAGNOSIS — D320 Benign neoplasm of cerebral meninges: Secondary | ICD-10-CM

## 2019-04-30 DIAGNOSIS — R569 Unspecified convulsions: Secondary | ICD-10-CM | POA: Diagnosis not present

## 2019-04-30 MED ORDER — DEXAMETHASONE 2 MG PO TABS: 2.0000 mg | ORAL_TABLET | Freq: Every day | ORAL | 0 refills | Status: DC

## 2019-04-30 NOTE — Progress Notes (Signed)
I connected with Joshua [name] on 04/30/19 at 12:30 PM EDT by telephone visit and verified that I am speaking with the correct person using two identifiers.  I discussed the limitations, risks, security and privacy concerns of performing an evaluation and management service by telemedicine and the availability of in-person appointments. I also discussed with the patient that there may be a patient responsible charge related to this service. The patient expressed understanding and agreed to proceed.  Other persons participating in the visit and their role in the encounter:  Spouse  Patient's location:  Home  Provider's location:  Office  Chief Complaint:  Seizures  History of Present Ilness: Joshua [name] describes no further seizures since our interventions last week.  He does, however, describe worsening numbness of the left foot and leg, some swelling in the foot, and overall increase in weakness/dysfunction affecting gait.  Does have frequent "whole body tremulousness" which responds nicely to Clonazepam. Observations: Language and cognition at prior baseline  Assessment and Plan:  For progressive neurologic symptoms (sensory changes and weakness) start dexamethasone 2mg  daily.  Will order MRI brain to assess for possible inflammation or progression following radiation therapy.  For seizures (better controlled since resuming Klonopin) will decrease dilantin to 200mg  HS x1 week, then 100mg  HS x1 week, then STOP.    Follow Up Instructions:  Return on 05/27/19 after MRI brain and review in brain/spine tumor board.  May return sooner if needed.  I discussed the assessment and treatment plan with the patient.  The patient was provided an opportunity to ask questions and all were answered.  The patient agreed with the plan and demonstrated understanding of the instructions.    The patient was advised to call back or seek an in-person evaluation if the symptoms worsen or if the condition fails to  improve as anticipated.  I provided 5-10 minutes of non-face-to-face time during this enocunter.  [name], MD   I provided 20 minutes of non face-to-face telephone visit time during this encounter, and > 50% was spent counseling as documented under my assessment & plan.

## 2019-05-01 ENCOUNTER — Telehealth: Payer: Self-pay | Admitting: Internal Medicine

## 2019-05-01 NOTE — Telephone Encounter (Signed)
I talk with patient regarding schedule  

## 2019-05-02 ENCOUNTER — Ambulatory Visit: Payer: Self-pay | Admitting: Internal Medicine

## 2019-05-08 ENCOUNTER — Other Ambulatory Visit: Payer: Self-pay

## 2019-05-08 ENCOUNTER — Ambulatory Visit (HOSPITAL_COMMUNITY)
Admission: RE | Admit: 2019-05-08 | Discharge: 2019-05-08 | Disposition: A | Discharge status: Home / Self Care | Payer: Medicaid Other | Source: Ambulatory Visit | Attending: Internal Medicine | Admitting: Internal Medicine

## 2019-05-08 DIAGNOSIS — D320 Benign neoplasm of cerebral meninges: Secondary | ICD-10-CM | POA: Diagnosis not present

## 2019-05-08 DIAGNOSIS — R569 Unspecified convulsions: Secondary | ICD-10-CM | POA: Insufficient documentation

## 2019-05-08 MED ORDER — GADOBUTROL 1 MMOL/ML IV SOLN
9.0000 mL | Freq: Once | INTRAVENOUS | Status: AC | PRN
  Administered 2019-05-08: 9 mL via INTRAVENOUS

## 2019-05-15 ENCOUNTER — Telehealth: Payer: Self-pay | Admitting: *Deleted

## 2019-05-15 NOTE — Telephone Encounter (Signed)
Received call from patient inquiring about several things. He would like to know if he can still get a flu shot here. I informed him that he could at his next visit. He also wants to know about any rehab - he states his foot is numb.  And the last thing is that he would like to know the results of his most recent imaging studies. His next appt in 05/27/19

## 2019-05-16 ENCOUNTER — Encounter: Payer: Self-pay | Admitting: *Deleted

## 2019-05-20 ENCOUNTER — Emergency Department (HOSPITAL_COMMUNITY): Payer: Medicaid Other

## 2019-05-20 ENCOUNTER — Encounter (HOSPITAL_COMMUNITY): Payer: Self-pay | Admitting: Emergency Medicine

## 2019-05-20 ENCOUNTER — Other Ambulatory Visit: Payer: Self-pay

## 2019-05-20 ENCOUNTER — Emergency Department (HOSPITAL_COMMUNITY)
Admission: EM | Admit: 2019-05-20 | Discharge: 2019-05-20 | Disposition: A | Discharge status: Home / Self Care | Payer: Medicaid Other | Attending: Emergency Medicine | Admitting: Emergency Medicine

## 2019-05-20 DIAGNOSIS — Z79899 Other long term (current) drug therapy: Secondary | ICD-10-CM | POA: Diagnosis not present

## 2019-05-20 DIAGNOSIS — Z85841 Personal history of malignant neoplasm of brain: Secondary | ICD-10-CM | POA: Diagnosis not present

## 2019-05-20 DIAGNOSIS — R05 Cough: Secondary | ICD-10-CM | POA: Insufficient documentation

## 2019-05-20 DIAGNOSIS — J029 Acute pharyngitis, unspecified: Secondary | ICD-10-CM | POA: Diagnosis not present

## 2019-05-20 DIAGNOSIS — Z7982 Long term (current) use of aspirin: Secondary | ICD-10-CM | POA: Diagnosis not present

## 2019-05-20 DIAGNOSIS — R12 Heartburn: Secondary | ICD-10-CM | POA: Diagnosis not present

## 2019-05-20 DIAGNOSIS — F17210 Nicotine dependence, cigarettes, uncomplicated: Secondary | ICD-10-CM | POA: Insufficient documentation

## 2019-05-20 DIAGNOSIS — R059 Cough, unspecified: Secondary | ICD-10-CM

## 2019-05-20 MED ORDER — LIDOCAINE VISCOUS HCL 2 % MT SOLN
15.0000 mL | Freq: Once | OROMUCOSAL | Status: AC
  Administered 2019-05-20: 15 mL via ORAL
  Filled 2019-05-20: qty 15

## 2019-05-20 MED ORDER — SUCRALFATE 1 G PO TABS: 1.0000 g | ORAL_TABLET | Freq: Three times a day (TID) | ORAL | 0 refills | Status: DC

## 2019-05-20 MED ORDER — ALUM & MAG HYDROXIDE-SIMETH 200-200-20 MG/5ML PO SUSP
30.0000 mL | Freq: Once | ORAL | Status: AC
  Administered 2019-05-20: 30 mL via ORAL
  Filled 2019-05-20: qty 30

## 2019-05-20 NOTE — ED Triage Notes (Signed)
Pt reports for the last 2 months after he eats, drinks, or lays down he has phlegm in his throat. States he take omeprazole for indigestion. NAD noted

## 2019-05-20 NOTE — ED Provider Notes (Signed)
[facility] EMERGENCY DEPARTMENT Provider Note   CSN: [id number] Arrival date & time: 05/20/19  1524     History   Chief Complaint Chief Complaint  Patient presents with  . Gastroesophageal Reflux    HPI Joshua [name] is a 58 y.o. male.     Patient with history of meningioma who presents the ED with reflux type symptoms.  States that he has had some sore throat, acid taste in his mouth for the last 2 months on and off after eating.  No chest pain, no shortness of breath, no abdominal pain.  Has had some intermittent cough.  No fever, no infectious symptoms.  He is on omeprazole with some relief.  States that he has a history of gastritis.  Has not had any black stools or bloody stools.  The history is provided by the patient.  Illness Severity:  Mild Onset quality:  Gradual Timing:  Intermittent Progression:  Waxing and waning Chronicity:  Recurrent Associated symptoms: cough and sore throat   Associated symptoms: no abdominal pain, no chest pain, no ear pain, no fever, no rash, no shortness of breath and no vomiting     Past Medical History:  Diagnosis Date  . Brain tumor ([place]) 02/20/2013   brain tumor removed in March 2014, Dr Donald Pore  . Dizziness   . Enlarged prostate   . Headache(784.0)    scattered  . Meningioma [facility])     Patient Active Problem List   Diagnosis Date Noted  . 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[place]) 03/07/2019  . Seizures ([place]) 01/31/2019  . Meningioma, recurrent of brain ([place]) 01/31/2019  . Meningioma ([place]) 01/03/2019  . Brain tumor ([place]) 12/27/2018  . Diffuse idiopathic skeletal hyperostosis 06/05/2013    Past Surgical History:  Procedure Laterality Date  . APPLICATION OF CRANIAL NAVIGATION N/A 01/10/2019   Procedure: APPLICATION OF CRANIAL NAVIGATION;  Surgeon: Erline Levine, MD;  Location: [facility];  Service: Neurosurgery;  Laterality: N/A;  . CATARACT EXTRACTION Right   . CRANIOTOMY Right 11/06/2012   Procedure: CRANIOTOMY TUMOR EXCISION;  Surgeon: Erline Levine, MD;  Location: [facility];  Service: Neurosurgery;  Laterality: Right;  Right Parasagittal craniotomy for meningioma with Stealth  . CRANIOTOMY Right 01/10/2019   Procedure: Right Parasagittal Craniotomy for Tumor;  Surgeon: Erline Levine, MD;  Location: [facility];  Service: Neurosurgery;  Laterality: Right;  Right parasagittal craniotomy for tumor  . ESOPHAGOGASTRODUODENOSCOPY    . right knee arthroscopy          Home Medications    Prior to Admission medications   Medication Sig Start Date End Date Taking? Authorizing Provider  aspirin EC 81 MG EC tablet Take 1 tablet (81 mg total) by mouth daily. 01/14/19   Erline Levine, MD  clonazePAM (KLONOPIN) 0.5 MG tablet Take 1 tablet (0.5 mg total) by mouth 2 (two) times daily. Take 1 tablet twice per day for the next 7 days. 04/22/19   Vaslow, Acey Lav, MD  dexamethasone (DECADRON) 2 MG tablet Take 1 tablet (2 mg total) by mouth daily. 04/30/19   [name], MD  FIBER ADULT GUMMIES PO Take 1 tablet by mouth daily.    [provider]  lamoTRIgine (LAMICTAL) 100 MG tablet Take 1 tablet (100 mg total) by mouth 2 (two) times daily. 03/26/19   [name], MD  levETIRAcetam (KEPPRA) 500 MG tablet Take 4 tablets (2,000 mg total) by mouth 2 (two) times daily. 03/21/19   [name], MD  LORazepam (ATIVAN) 1 MG tablet Take 1 tablet (1 mg total) by mouth every 8 (eight) hours as needed for seizure (for breakthrough seizures). 04/15/19   [name], MD  Melatonin 5 MG CHEW Chew 3 tablets by mouth at bedtime as needed (sleep).    [provider]  naproxen sodium (ALEVE) 220 MG tablet Take 440 mg by mouth as needed (pain or headache).    [provider]  phenytoin (DILANTIN) 100 MG ER capsule Take 3 capsules (300 mg total) by mouth at bedtime. 04/22/19   [name], MD  prochlorperazine (COMPAZINE) 10 MG tablet Take 1 tablet (10 mg total) by mouth  every 6 (six) hours as needed for nausea or vomiting. Patient not taking: Reported on 04/22/2019 03/08/19   Kyung Rudd, MD  sucralfate (CARAFATE) 1 g tablet Take 1 tablet (1 g total) by mouth 4 (four) times daily -  with meals and at bedtime for 14 days. 05/20/19 06/03/19  Madylin Fairbank, DO  triamcinolone cream (KENALOG) 0.1 % Apply 1 application topically 2 (two) times daily. Patient not taking: Reported on 04/22/2019 02/25/19   Charlott Rakes, MD  vitamin C (ASCORBIC ACID) 500 MG tablet Take 500 mg by mouth daily.    [provider]    Family History Family History  Problem Relation Age of Onset  . Hypertension Mother   . Hypertension Father     Social History Social History   Tobacco Use  . Smoking status: Current Some Day Smoker    Types: Cigarettes  . Smokeless tobacco: Never Used  . Tobacco comment: hasn't smoked in a week  Substance Use Topics  . Alcohol use: Yes    Alcohol/week: 8.0 standard drinks    Types: 6 Cans of beer, 2 Shots of liquor per week    Comment: occasionally  . Drug use: No     Allergies   Patient has no known allergies.   Review of Systems Review of Systems  Constitutional: Negative for chills and fever.  HENT: Positive for sore throat. Negative for ear pain.   Eyes: Negative for pain and visual disturbance.  Respiratory: Positive for cough. Negative for shortness of breath.   Cardiovascular: Negative for chest pain and palpitations.  Gastrointestinal: Negative for abdominal pain and vomiting.  Genitourinary: Negative for dysuria and hematuria.  Musculoskeletal: Negative for arthralgias and back pain.  Skin: Negative for color change and rash.  Neurological: Negative for seizures and syncope.  All other systems reviewed and are negative.    Physical Exam Updated Vital Signs  ED Triage Vitals  Enc Vitals Group     BP 05/20/19 1533 123/85     Pulse Rate 05/20/19 1533 69     Resp 05/20/19 1533 14     Temp 05/20/19 1533 98.2 F  (36.8 C)     Temp Source 05/20/19 1533 Oral     SpO2 05/20/19 1533 94 %     Weight --      Height --      Head Circumference --      Peak Flow --      Pain Score 05/20/19 1543 0     Pain Loc --      Pain Edu? --      Excl. in [place]? --     Physical Exam Vitals signs and nursing note reviewed.  Constitutional:      General: He is not in acute distress.    Appearance: He is well-developed. He is not ill-appearing.  HENT:     Head: Normocephalic and atraumatic.     Nose: Nose normal.     Mouth/Throat:     Mouth: Mucous membranes are moist.  Eyes:     Extraocular Movements: Extraocular movements intact.     Conjunctiva/sclera: Conjunctivae normal.     Pupils: Pupils are equal, round, and reactive to light.  Neck:     Musculoskeletal: Normal range of motion and neck supple.  Cardiovascular:     Rate and Rhythm: Normal rate and regular rhythm.     Pulses: Normal pulses.     Heart sounds: Normal heart sounds. No murmur.  Pulmonary:     Effort: Pulmonary effort is normal. No respiratory distress.     Breath sounds: Normal breath sounds.  Abdominal:     Palpations: Abdomen is soft.     Tenderness: There is no abdominal tenderness.  Musculoskeletal:     Right lower leg: No edema.     Left lower leg: No edema.  Skin:    General: Skin is warm and dry.     Capillary Refill: Capillary refill takes less than 2 seconds.  Neurological:     General: No focal deficit present.     Mental Status: He is alert.  Psychiatric:        Mood and Affect: Mood normal.      ED Treatments / Results  Labs (all labs ordered are listed, but only abnormal results are displayed) Labs Reviewed - No data to display  EKG EKG Interpretation  Date/Time:  Monday May 20 2019 17:06:20 EST Ventricular Rate:  56 PR Interval:    QRS Duration: 89 QT Interval:  413 QTC Calculation: 399 R Axis:   95 Text Interpretation: Sinus rhythm Borderline right axis deviation Nonspecific T abnormalities,  lateral leads Confirmed by Lennice Sites [id number]) on 05/20/2019 5:26:23 PM   Radiology Dg Chest 2 View  Result Date: 05/20/2019 CLINICAL DATA:  Reflux chest congestion EXAM: CHEST - 2 VIEW COMPARISON:  03/12/2019 FINDINGS: The heart size and mediastinal contours are within normal limits. Both lungs are clear. Mild aortic atherosclerosis. Degenerative changes of the spine. IMPRESSION: No active cardiopulmonary disease. Electronically Signed   By: Donavan Foil M.D.   On: 05/20/2019 17:22    Procedures Procedures (including critical care time)  Medications Ordered in ED Medications  alum & mag hydroxide-simeth (MAALOX/MYLANTA) 200-200-20 MG/5ML suspension 30 mL (30 mLs Oral Given 05/20/19 1728)    And  lidocaine (XYLOCAINE) 2 % viscous mouth solution 15 mL (15 mLs Oral Given 05/20/19 1728)     Initial Impression / Assessment and Plan / ED Course  I have reviewed the triage vital signs and the nursing notes.  Pertinent labs & imaging results that were available during my care of the patient were reviewed by me and considered in my medical decision making (see chart for details).     RAMONDO SPIGELMYER is a 58 year old male who presents to the ED with reflux type symptoms.  Patient with normal vitals.  No fever.  Denies any chest pain, shortness of breath.  Reflux type symptoms after eating food for the last 2 months.  History of the same.  Is on omeprazole.  Denies any black stools or bloody stools.  Does not have any abdominal pain.  No abdominal tenderness on exam.  Clear breath sounds.  Chest x-ray showed no signs of infection.  EKG shows sinus rhythm.  Doubt any cardiac or pulmonary source for symptoms today.  Symptoms appear consistent  with reflux as patient states that after eating food he will get an acid taste up in his mouth especially at night when he is lying flat.  Will prescribe Carafate in addition to omeprazole.  We will have him follow-up with primary care and GI.  Discharged in  good condition.  Given return precautions.  This chart was dictated using voice recognition software.  Despite best efforts to proofread,  errors can occur which can change the documentation meaning.    Final Clinical Impressions(s) / ED Diagnoses   Final diagnoses:  Cough    ED Discharge Orders         Ordered    sucralfate (CARAFATE) 1 g tablet  3 times daily with meals & bedtime     05/20/19 1801           Lennice Sites, DO 05/20/19 1804

## 2019-05-23 ENCOUNTER — Telehealth: Payer: Self-pay | Admitting: *Deleted

## 2019-05-23 ENCOUNTER — Other Ambulatory Visit: Payer: Self-pay | Admitting: Internal Medicine

## 2019-05-23 DIAGNOSIS — D320 Benign neoplasm of cerebral meninges: Secondary | ICD-10-CM

## 2019-05-23 DIAGNOSIS — G8194 Hemiplegia, unspecified affecting left nondominant side: Secondary | ICD-10-CM

## 2019-05-23 NOTE — Telephone Encounter (Signed)
Patient called to request PT orders for leg weakness.  Dr. Mickeal Skinner to enter orders.

## 2019-05-27 ENCOUNTER — Other Ambulatory Visit: Payer: Self-pay | Admitting: Internal Medicine

## 2019-05-27 ENCOUNTER — Other Ambulatory Visit: Payer: Self-pay

## 2019-05-27 ENCOUNTER — Telehealth: Payer: Self-pay | Admitting: Internal Medicine

## 2019-05-27 ENCOUNTER — Inpatient Hospital Stay: Payer: Medicaid Other | Attending: Internal Medicine | Admitting: Internal Medicine

## 2019-05-27 ENCOUNTER — Other Ambulatory Visit: Payer: Self-pay | Admitting: *Deleted

## 2019-05-27 VITALS — BP 125/89 | HR 68 | Temp 98.7°F | Resp 18 | Ht 69.0 in | Wt 201.8 lb

## 2019-05-27 DIAGNOSIS — Z23 Encounter for immunization: Secondary | ICD-10-CM | POA: Insufficient documentation

## 2019-05-27 DIAGNOSIS — D320 Benign neoplasm of cerebral meninges: Secondary | ICD-10-CM | POA: Insufficient documentation

## 2019-05-27 MED ORDER — INFLUENZA VAC SPLIT QUAD 0.5 ML IM SUSY
PREFILLED_SYRINGE | INTRAMUSCULAR | Status: AC
  Filled 2019-05-27: qty 0.5

## 2019-05-27 MED ORDER — PHENYTOIN SODIUM EXTENDED 100 MG PO CAPS: 200.0000 mg | ORAL_CAPSULE | Freq: Every day | ORAL | 3 refills | Status: DC

## 2019-05-27 MED ORDER — DEXAMETHASONE 1 MG PO TABS: 1.0000 mg | ORAL_TABLET | Freq: Every day | ORAL | 1 refills | Status: DC

## 2019-05-27 MED ORDER — DEXAMETHASONE 1 MG PO TABS: 2.0000 mg | ORAL_TABLET | Freq: Every day | ORAL | 1 refills | Status: DC

## 2019-05-27 MED ORDER — CLONAZEPAM 0.5 MG PO TABS: 0.5000 mg | ORAL_TABLET | Freq: Two times a day (BID) | ORAL | 1 refills | Status: DC

## 2019-05-27 MED ORDER — INFLUENZA VAC SPLIT QUAD 0.5 ML IM SUSY
0.5000 mL | PREFILLED_SYRINGE | Freq: Once | INTRAMUSCULAR | Status: AC
  Administered 2019-05-27: 0.5 mL via INTRAMUSCULAR

## 2019-05-27 NOTE — Telephone Encounter (Signed)
Scheduled appt per 11/9 los. ° °Spoke with pt and they are aware of the appt date and time. °

## 2019-05-28 NOTE — Progress Notes (Signed)
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 Vertell Limber (WHO I) 01/10/19: Repeat craniotomy, debulking resection by Dr. Vertell Limber after tumor recurrence, seizures.  Path demonstrates islands of anaplasia c/w grade II/III. 02/21/19: Begins IMRT with Dr. Lisbeth Renshaw  Interval History:  Joshua [name] presents today for follow up after recent MRI brain.  He describes continued numbness of the left leg, which forces him to walk with a cane.  The foot is still somewhat weak post-ictally following seizure events.  No recurrence of seizures since dilantin was resumed at 200mg  nightly.  Otherwise no new or progressive neurologic deficits.  H+P (02/05/19) Patient presents to review clinical course and care for his meningioma.  Initially he presented in 2014 with headache syndrome, was found to have tumor on imaging which was resected by Dr. Vertell Limber.  The patient was lost to follow up for ~5 years until he developed seizures, described as "twitching of left leg spreading to arm" this past month.  He required loads of Keppra and Dilantin to break events.  MRI demonstrated significant regrowth of the mass, and repeat craniotomy was performed on 01/10/19.  Small amount of residual tumor was visible on post-operative MRI.  Since surgery he has continued to experience numbness and clumsiness of his left leg, requiring cane for ambulation.  He is still pending home physical therapy evaluation.  He has had "small" seizures, consisting of few seconds of shaking of left leg, occurring less than daily.  Continues on  Keppra and Dilantin.    Medications: Current Outpatient Medications on File Prior to Visit  Medication Sig Dispense Refill   aspirin EC 81 MG EC tablet Take 1 tablet (81 mg total) by mouth daily. 30 tablet 3   FIBER ADULT GUMMIES PO Take 1 tablet by mouth daily.     lamoTRIgine (LAMICTAL) 100 MG tablet TAKE 1 TABLET(100 MG) BY MOUTH TWICE DAILY 30 tablet 3   LORazepam (ATIVAN) 1 MG tablet Take 1 tablet (1 mg total) by mouth every 8 (eight) hours as needed for seizure (for breakthrough seizures). 12 tablet 0   Melatonin 5 MG CHEW Chew 3 tablets by mouth at bedtime as needed (sleep).     naproxen sodium (ALEVE) 220 MG tablet Take 440 mg by mouth as needed (pain or headache).     prochlorperazine (COMPAZINE) 10 MG tablet Take 1 tablet (10 mg total) by mouth every 6 (six) hours as needed for nausea or vomiting. 30 tablet 1   sucralfate (CARAFATE) 1 g tablet Take 1 tablet (1 g total) by mouth 4 (four) times daily -  with meals and at bedtime for 14 days. 56 tablet 0   triamcinolone cream (KENALOG) 0.1 % Apply 1 application topically 2 (two) times daily. 30 g 1   vitamin C (ASCORBIC ACID) 500 MG tablet Take 500 mg by mouth daily.     No current facility-administered medications on file prior to visit.     Allergies: No Known Allergies Past Medical History:  Past Medical History:  Diagnosis Date   Brain tumor ([place]) 02/20/2013   brain tumor removed in March 2014, Dr Donald Pore   Dizziness  Enlarged prostate    Headache(784.0)    scattered   Meningioma [facility])    Past Surgical History:  Past Surgical History:  Procedure Laterality Date   APPLICATION OF CRANIAL NAVIGATION N/A 01/10/2019   Procedure: APPLICATION OF CRANIAL NAVIGATION;  Surgeon: Erline Levine, MD;  Location: [facility];  Service: Neurosurgery;  Laterality: N/A;   CATARACT EXTRACTION Right    CRANIOTOMY Right 11/06/2012   Procedure: CRANIOTOMY TUMOR EXCISION;  Surgeon: Erline Levine, MD;  Location: [facility];   Service: Neurosurgery;  Laterality: Right;  Right Parasagittal craniotomy for meningioma with Stealth   CRANIOTOMY Right 01/10/2019   Procedure: Right Parasagittal Craniotomy for Tumor;  Surgeon: Erline Levine, MD;  Location: [facility];  Service: Neurosurgery;  Laterality: Right;  Right parasagittal craniotomy for tumor   ESOPHAGOGASTRODUODENOSCOPY     right knee arthroscopy     Social History:  Social History   Socioeconomic History   Marital status: Married    Spouse name: Not on file   Number of children: Not on file   Years of education: Not on file   Highest education level: Not on file  Occupational History   Not on file  Social Needs   Financial resource strain: Not on file   Food insecurity    Worry: Not on file    Inability: Not on file   Transportation needs    Medical: No    Non-medical: No  Tobacco Use   Smoking status: Current Some Day Smoker    Types: Cigarettes   Smokeless tobacco: Never Used   Tobacco comment: hasn't smoked in a week  Substance and Sexual Activity   Alcohol use: Yes    Alcohol/week: 8.0 standard drinks    Types: 6 Cans of beer, 2 Shots of liquor per week    Comment: occasionally   Drug use: No   Sexual activity: Yes  Lifestyle   Physical activity    Days per week: Not on file    Minutes per session: Not on file   Stress: Not on file  Relationships   Social connections    Talks on phone: Not on file    Gets together: Not on file    Attends religious service: Not on file    Active member of club or organization: Not on file    Attends meetings of clubs or organizations: Not on file    Relationship status: Not on file   Intimate partner violence    Fear of current or ex partner: Not on file    Emotionally abused: Not on file    Physically abused: Not on file    Forced sexual activity: Not on file  Other Topics Concern   Not on file  Social History Narrative   Not on file   Family History:  Family History    Problem Relation Age of Onset   Hypertension Mother    Hypertension Father     Review of Systems: Constitutional: Denies fevers, chills or abnormal weight loss Eyes: Denies blurriness of vision Ears, nose, mouth, throat, and face: Denies mucositis or sore throat Respiratory: Denies cough, dyspnea or wheezes Cardiovascular: Denies palpitation, chest discomfort or lower extremity swelling Gastrointestinal:  Denies nausea, constipation, diarrhea GU: Denies dysuria or incontinence Skin: Denies abnormal skin rashes Neurological: Per HPI Musculoskeletal: Denies joint pain, back or neck discomfort. No decrease in ROM Behavioral/Psych: Denies anxiety, disturbance in thought content, and mood instability  Physical Exam: Vitals:   05/27/19 0953  BP: 125/89  Pulse:  68  Resp: 18  Temp: 98.7 F (37.1 C)  SpO2: 97%   KPS: 80. General: Alert, cooperative, pleasant, in no acute distress Head: Craniotomy scar noted, dry and intact. EENT: No conjunctival injection or scleral icterus. Oral mucosa moist Lungs: Resp effort normal Cardiac: Regular rate and rhythm Abdomen: Soft, non-distended abdomen Skin: No rashes cyanosis or petechiae. Extremities: No clubbing or edema  Neurologic Exam: Mental Status: Awake, alert, attentive to examiner. Oriented to self and environment. Language is fluent with intact comprehension.  Cranial Nerves: Visual acuity is grossly normal. Visual fields are full. Extra-ocular movements intact. No ptosis. Face is symmetric, tongue midline. Motor: Tone and bulk are normal.Power is impaired in left leg, 4+/5 distally. Reflexes are symmetric, no pathologic reflexes present. Impaired heel to shin left leg Sensory: Diminished to light touch and temp left lower leg Gait: Independent  Labs: I have reviewed the data as listed    Component Value Date/Time   NA 140 03/12/2019 1502   K 4.1 03/12/2019 1502   CL 104 03/12/2019 1502   CO2 27 03/12/2019 1502   GLUCOSE  145 (H) 03/12/2019 1502   BUN 14 03/12/2019 1502   CREATININE 0.83 03/12/2019 1502   CALCIUM 9.3 03/12/2019 1502   PROT 6.8 02/18/2019 0920   ALBUMIN 4.0 02/18/2019 0920   AST 29 02/18/2019 0920   ALT 41 02/18/2019 0920   ALKPHOS 96 02/18/2019 0920   BILITOT 0.4 02/18/2019 0920   GFRNONAA >60 03/12/2019 1502   GFRAA >60 03/12/2019 1502   Lab Results  Component Value Date   WBC 6.9 03/12/2019   NEUTROABS 3.9 03/12/2019   HGB 14.7 03/12/2019   HCT 42.7 03/12/2019   MCV 93.2 03/12/2019   PLT 224 03/12/2019   Imaging:  [facility] Clinician Interpretation: I have personally reviewed the CNS images as listed.  My interpretation, in the context of the patient's clinical presentation, is stable disease  Dg Chest 2 View  Result Date: 05/20/2019 CLINICAL DATA:  Reflux chest congestion EXAM: CHEST - 2 VIEW COMPARISON:  03/12/2019 FINDINGS: The heart size and mediastinal contours are within normal limits. Both lungs are clear. Mild aortic atherosclerosis. Degenerative changes of the spine. IMPRESSION: No active cardiopulmonary disease. Electronically Signed   By: Donavan Foil M.D.   On: 05/20/2019 17:22   Mr Jeri Cos [id number] Contrast  Result Date: 05/09/2019 CLINICAL DATA:  Followup meningioma EXAM: MRI HEAD WITHOUT AND WITH CONTRAST TECHNIQUE: Multiplanar, multiecho pulse sequences of the brain and surrounding structures were obtained without and with intravenous contrast. CONTRAST:  98mL GADAVIST GADOBUTROL 1 MMOL/ML IV SOLN COMPARISON:  MRI 01/11/2019. CT 02/18/2019. preoperative MRI 01/08/2019 FINDINGS: Brain: Patient has had previous vertex craniotomy for resection of the previously seen large meningioma with cavernous sinus invasion and segmental occlusion. There is generalized postoperative dural thickening and enhancement, including along the calvarial surface and along the falx. At the posterior margin of the resection, there remains evident in approximately 1 cm region of enhancement that could  possibly represent residual tumor as suggested previously. This does not appear progressive and I do not think it is definite. This could relate to benign dural thickening and enhancement. No evidence of postoperative infarction. Mild volume loss and gliosis of the parasagittal parietal brain right more than left. No hydrocephalus. No unexpected extra-axial fluid collection. Vascular: Segmental resection of the superior sagittal sinus as seen previously. Distal superior sagittal sinus is patent. Transverse and sigmoid sinuses are patent. Skull and upper cervical spine: Otherwise negative Sinuses/Orbits: Clear/normal Other:  None IMPRESSION: Previous vertex craniotomy for resection of a large meningioma invading the superior sagittal sinus. This first delayed postoperative scan shows widespread dural thickening and enhancement presumably subsequent to the previous surgery. There is some focal thickening and enhancement along the posterior margin of the resection as noted previously, measuring about a cm in size. This could possibly represent a small amount of residual tumor but I do not think that is definite. It could represent benign dural thickening. Continued observation is warranted. Electronically Signed   By: Nelson Chimes M.D.   On: 05/09/2019 08:58     Assessment/Plan 1. Meningioma, recurrent of brain ([place])  2. Seizures [facility])  Joshua [name] is clinically and radiographically stable today.  He does demonstrate motor/gait dysfunction secondary to sensory impairment in his left leg.  For refractory focal epilepsy we recommended the following: -Con't Keppra 2000mg  BID -Con't Clonazepam 0.5mg  BID -Continue Lamictal 100mg  BID -Con't PHT 200mg  -Reduce dexamethasone to 1mg  daily -No further PRN benzos breathrough seizures due to improper use.  Notably, he was unable to tolerate full withdrawal of PHT.  We appreciate the opportunity to participate in the care of Joshua [name].    All questions  were answered. The patient knows to call the clinic with any problems, questions or concerns. No barriers to learning were detected.  He should return to clinic in 1 month for epilepsy follow up and further steroid taper instructions.  Next brain MRI should be in 3 months.  The total time spent in the encounter was 40 minutes and more than 50% was on counseling and review of test results   [name], MD Medical Director of Neuro-Oncology [facility] [place] 05/28/19 10:18 AM

## 2019-05-29 ENCOUNTER — Ambulatory Visit: Payer: Medicaid Other | Attending: Internal Medicine

## 2019-05-29 ENCOUNTER — Other Ambulatory Visit: Payer: Self-pay

## 2019-05-29 DIAGNOSIS — R2689 Other abnormalities of gait and mobility: Secondary | ICD-10-CM | POA: Insufficient documentation

## 2019-05-29 DIAGNOSIS — M6281 Muscle weakness (generalized): Secondary | ICD-10-CM | POA: Diagnosis present

## 2019-05-29 NOTE — Therapy (Signed)
[facility] [address] [place], [place], [address] Phone: [phone]   Fax:  [phone]  Physical Therapy Evaluation  Patient Details  Name: KRISTHIAN BABAR MRN: [id number] Date of Birth: 02/03/1961 Referring Provider (PT): Cecil Cobbs   Encounter Date: 05/29/2019  PT End of Session - 05/29/19 1148    Visit Number  1    Number of Visits  12    Date for PT Re-Evaluation  07/28/19    Authorization Type  Medicaid    PT Start Time  [id number]    PT Stop Time  1229    PT Time Calculation (min)  44 min    Activity Tolerance  Patient tolerated treatment well    Behavior During Therapy  [facility] for tasks assessed/performed       Past Medical History:  Diagnosis Date  . Brain tumor ([place]) 02/20/2013   brain tumor removed in March 2014, Dr Donald Pore  . Dizziness   . Enlarged prostate   . Headache(784.0)    scattered  . Meningioma [facility])     Past Surgical History:  Procedure Laterality Date  . APPLICATION OF CRANIAL NAVIGATION N/A 01/10/2019   Procedure: APPLICATION OF CRANIAL NAVIGATION;  Surgeon: Erline Levine, MD;  Location: [facility];  Service: Neurosurgery;  Laterality: N/A;  . CATARACT EXTRACTION Right   . CRANIOTOMY Right 11/06/2012   Procedure: CRANIOTOMY TUMOR EXCISION;  Surgeon: Erline Levine, MD;  Location: [facility];  Service: Neurosurgery;  Laterality: Right;  Right Parasagittal craniotomy for meningioma with Stealth  . CRANIOTOMY Right 01/10/2019   Procedure: Right Parasagittal Craniotomy for Tumor;  Surgeon: Erline Levine, MD;  Location: [facility];  Service: Neurosurgery;  Laterality: Right;  Right parasagittal craniotomy for tumor  . ESOPHAGOGASTRODUODENOSCOPY    . right knee arthroscopy      There were no vitals filed for this visit.   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 Pt reports that left foot has decreased sensation and is weak. Also notes weakness throughout left leg. Reports this all started with seizure that occurred prior to last tumor removal. Now has to walk with a cane for safety. Reports that he has also had swelling on and off in left foot that they are monitoring. Elevating seems to help with that. Medications has been helping with seizures but still has some. Reports 2 in last month. Reports when occur he gets a throbbing up back and some jerking but does not use consciousness. Carries lorazepam to help if feels coming on which helps.    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 History of right THA on 2019.    Patient Stated Goals  Pt would like to be able to elimate his cane use.    Currently in Pain?  Yes    Pain Score  0-No pain   4 at worst when up on it a lot   Pain Location  Hip    Pain Orientation  Right    Pain Descriptors / Indicators  Aching    Pain Type  Chronic pain    Pain Onset  More than a month ago    Pain Frequency  Intermittent    Aggravating Factors   when up on it a lot  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    Referring Provider (PT)  Cecil Cobbs    Onset Date/Surgical Date  01/10/19    Hand Dominance  Right    Prior Therapy  none      Precautions   Precautions  Fall      Balance Screen   Has the patient fallen in the past 6 months  No    Has the patient had a decrease in activity level because of a fear of falling?   Yes    Is the patient reluctant to leave their home because of a fear of falling?   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 Has been out on unemployment since Covid    Leisure  fishing      Cognition   Memory  Impaired    Memory Impairment  Decreased short term memory;Retrieval deficit      Observation/Other Assessments   Observations  skin intact      Observation/Other Assessments-Edema    Edema  --   none noted in feet     Sensation   Light Touch  Impaired Detail    Light Touch Impaired Details  Impaired LLE    Additional Comments  able to feel light touch on left but feels different than right in LE      Coordination   Gross Motor Movements are Fluid and Coordinated  Yes    Fine Motor Movements are Fluid and Coordinated  Yes      ROM / Strength   AROM / PROM / Strength  Strength      Strength   Strength Assessment Site  Shoulder;Elbow;Hip;Knee;Ankle    Right/Left Shoulder  Right;Left    Right Shoulder Flexion  5/5    Left Shoulder Flexion  5/5    Right/Left Elbow  Right;Left    Right Elbow Flexion  5/5    Right Elbow Extension  5/5    Left Elbow Flexion  5/5    Left Elbow Extension  5/5    Right/Left Hip  Right;Left    Right Hip Flexion  4/5    Right Hip ABduction  3+/5    Left Hip Flexion  4/5    Left Hip ABduction  4/5    Right/Left Knee  Right;Left    Right Knee Flexion  5/5    Right Knee Extension  5/5    Left Knee Flexion  4/5    Left Knee Extension  4/5    Right/Left Ankle  Right;Left    Right Ankle Dorsiflexion  5/5    Left Ankle Dorsiflexion  2+/5      Bed Mobility   Bed Mobility  Rolling Right;Rolling Left;Supine to Sit;Sit to Supine    Rolling Right  Independent    Rolling Left  Independent    Supine to Sit  Independent    Sit to Supine  Independent      Transfers   Transfers  Sit to Stand;Stand to Sit    Sit to Stand  6: Modified independent (Device/Increase time)    Stand to Sit  6: Modified independent (Device/Increase time)      Ambulation/Gait   Ambulation/Gait  Yes    Ambulation/Gait Assistance  5: Supervision    Ambulation Distance (Feet)  75 Feet    Assistive device  Straight cane    Gait Pattern  Step-through pattern;Decreased dorsiflexion - left    Ambulation Surface  Level;Indoor    Gait velocity  0.26m/s    Stairs  Yes    Stairs Assistance  6: Modified independent (Device/Increase time)    Stair Management Technique  Two rails;Step to pattern    Number of Stairs  4      Standardized Balance Assessment   Standardized Balance Assessment  Berg Balance Test      Berg Balance Test   Sit to Stand  Able to stand  independently using hands    Standing Unsupported  Able to stand safely 2 minutes    Sitting with Back Unsupported but Feet Supported on Floor or Stool  Able to sit safely and securely 2 minutes    Stand to Sit  Sits safely with minimal use of hands    Transfers  Able to transfer safely, definite need of hands    Standing Unsupported with Eyes Closed  Able to stand 10 seconds safely    Standing Unsupported with Feet Together  Able to place feet together independently and stand 1 minute safely    From Standing, Reach Forward with Outstretched Arm  Can reach confidently >25 cm (10")    From Standing Position, Pick up Object from Floor  Able to pick up shoe safely and easily    From Standing Position, Turn to Look Behind Over each Shoulder  Turn sideways only but maintains balance    Turn 360 Degrees  Able to turn 360 degrees  safely but slowly    Standing Unsupported, Alternately Place Feet on Step/Stool  Able to complete 4 steps without aid or supervision    Standing Unsupported, One Foot in Front  Able to plae foot ahead of the other independently and hold 30 seconds    Standing on One Leg  Tries to lift leg/unable to hold 3 seconds but remains standing independently    Total Score  44                Objective measurements completed on examination: See above findings.              PT Education - 05/29/19 1514    Education Details  Pt was instructed in PT plan of care    Person(s) Educated  Patient    Methods  Explanation    Comprehension  Verbalized understanding       PT Short Term Goals - 05/29/19 1940      PT SHORT TERM GOAL #1   Title  Pt will be independent with HEP to continue strength and balance gains on own.    Baseline  currently does not have an exercise program    Time  4    Period  Weeks    Status  New    Target Date  06/28/19      PT SHORT TERM GOAL #2   Title  Pt will ambulate >300' on level surfaces mod I for improved household and short community distances without AD    Baseline  currently 63' supervision with [facility]    Time  4    Period  Weeks    Status  New    Target Date  06/28/19      PT SHORT TERM GOAL #3   Title  Pt will increase gait speed from 0.57m/s to >0.51 m/s for improved gait safety.    Baseline  0.46m/s on 05/29/2019    Time  4    Period  Weeks    Status  New    Target Date  06/28/19        PT Long Term Goals - 05/29/19 1944      PT LONG TERM GOAL #1   Title  Pt will be independent with progressive HEP for strength and balance to continue gains on own.    Baseline  currently does not have HEP    Time  8    Period  Weeks    Status  New    Target Date  07/28/19      PT LONG TERM GOAL #2   Title  Pt will increase gait speed to >0.7 m/s for improved gait safety in community.    Baseline  0.70m/s on 05/29/2019    Time  8    Period   Weeks    Status  New    Target Date  07/28/19      PT LONG TERM GOAL #3   Title  Pt will increase Berg score form 44/56 to >48/56 for improved balance and decreased fall risk.    Baseline  44/56 on 05/29/2019    Time  8    Period  Weeks    Status  New    Target Date  07/28/19      PT LONG TERM GOAL #4   Title  Pt will ambulate >500' on varied surfaces independently without AD for improved community mobility.    Baseline  12' with [facility] supervision.    Time  8    Period  Weeks    Status  New    Target Date  07/28/19             Plan - 05/29/19 1515    Clinical Impression Statement  Pt present to PT after 2nd craniotomy and resction of meningioma. Had developed seizures as well. Pt currently having to ambulate with SPC due to decreased stability. Berg score of 44/56 indicates patient is fall risk. Pt presents with impairments in strength, balance and gait with left hemiparesis. Pt will benefit from skilled PT to address deficits.    Examination-Activity Limitations  Locomotion Level;Stairs;Dressing    Examination-Participation Restrictions  Community Activity    Stability/Clinical Decision Making  Evolving/Moderate complexity    Clinical Decision Making  Moderate    Rehab Potential  Good    PT Frequency  1x / week   plus eval, followed by 2x/week for 4 weeks   PT Duration  3 weeks   followed by 2x/week for 4 weeks   PT Treatment/Interventions  ADLs/Self Care Home Management;DME Instruction;Gait training;Stair training;Functional mobility training;Therapeutic activities;Patient/family education;Neuromuscular re-education;Balance training;Therapeutic exercise;Orthotic Fit/Training;Manual  techniques;Passive range of motion    PT Next Visit Plan  Next visit gait training, balance training, initiate standing strengthening and balance HEP    Consulted and Agree with Plan of Care  Patient       Patient will benefit from skilled therapeutic intervention in order to improve the  following deficits and impairments:  Abnormal gait, Decreased activity tolerance, Decreased balance, Difficulty walking, Decreased strength, Impaired sensation, Postural dysfunction, Impaired flexibility, Decreased mobility, Decreased range of motion  Visit Diagnosis: Other abnormalities of gait and mobility  Muscle weakness (generalized)     Problem List Patient Active Problem List   Diagnosis Date Noted  . 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[place]) 03/07/2019  . Seizures ([place]) 01/31/2019  . Meningioma, recurrent of brain ([place]) 01/31/2019  . Meningioma ([place]) 01/03/2019  . Brain tumor ([place]) 12/27/2018  . Diffuse idiopathic skeletal hyperostosis 06/05/2013    Electa Sniff, PT, DPT, NCS 05/29/2019, 7:48 PM  [facility] [address] [place], [place], [address] Phone: [phone]   Fax:  [phone]  Name: LOYS BLASS MRN: [id number] Date of Birth: 09-27-1960

## 2019-05-30 ENCOUNTER — Telehealth: Payer: Self-pay | Admitting: *Deleted

## 2019-05-30 NOTE — Telephone Encounter (Signed)
Pt called regarding number of Rx written.  Pt understood there would be two Rx written and questioned pharmacy when only one Rx given.  EDCM reviewed chart to confirm only one Rx written.  Pt appreciative of information.

## 2019-06-03 ENCOUNTER — Ambulatory Visit: Payer: Medicaid Other | Attending: Family Medicine | Admitting: Family Medicine

## 2019-06-03 ENCOUNTER — Other Ambulatory Visit: Payer: Self-pay

## 2019-06-03 ENCOUNTER — Encounter: Payer: Self-pay | Admitting: Family Medicine

## 2019-06-03 DIAGNOSIS — M79605 Pain in left leg: Secondary | ICD-10-CM | POA: Diagnosis not present

## 2019-06-03 DIAGNOSIS — D320 Benign neoplasm of cerebral meninges: Secondary | ICD-10-CM | POA: Diagnosis not present

## 2019-06-03 DIAGNOSIS — R569 Unspecified convulsions: Secondary | ICD-10-CM

## 2019-06-03 MED ORDER — CYCLOBENZAPRINE HCL 10 MG PO TABS: 10.0000 mg | ORAL_TABLET | Freq: Two times a day (BID) | ORAL | 1 refills | Status: DC | PRN

## 2019-06-03 NOTE — Progress Notes (Signed)
Patient has been called and DOB has been verified. Patient has been screened and transferred to PCP to start phone visit.  Patient is having pains in the back of his legs.  Patient is no longer having coughing, patient picked up medication and is feeling better.

## 2019-06-03 NOTE — Progress Notes (Signed)
Virtual Visit via Telephone Note  I connected with Joshua [name], on 06/03/2019 at 1:37 PM by telephone due to the COVID-19 pandemic and verified that I am speaking with the correct person using two identifiers.   Consent: I discussed the limitations, risks, security and privacy concerns of performing an evaluation and management service by telephone and the availability of in person appointments. I also discussed with the patient that there may be a patient responsible charge related to this service. The patient expressed understanding and agreed to proceed.   Location of Patient: Home  Location of Provider: Clinic   Persons participating in Telemedicine visit: Joshua [name] Dr. Margarita Rana     History of Present Illness: 58 year old right-handed male male with a history of meningioma status post resection in 2014 hospitalized at [facility] from 01/03/2019 through 01/14/2019 for recurrent meningioma status post resection (the last of which was in 12/2018), seizures here for a follow-up visit. He had radiation with radiation oncology and completed in 03/2019. Also seen by medical oncology 1 week ago and remains on Keppra, Lamictal, Klonopin, Phenytoin, dexamethasone for his seizures. He had mini seizures the last of which was 2 days ago. He has headaches which are intermittent about 6/10 about twice/week. He has pain in the back of his L leg behind L knee, throbs, aches 6/10. At his last visit he had complained of left leg paresthesia; his left foot was swollen but has now resolved. He ambulates with a cane, no recent falls. States it feels like a muscle spasm in his posterior L leg. He is currently undergoing PT. denies recent falls.  Most recent MRI from 04/2019 reveals: IMPRESSION: Previous vertex craniotomy for resection of a large meningioma invading the superior sagittal sinus. This first delayed postoperative scan shows widespread dural  thickening and enhancement presumably subsequent to the previous surgery. There is some focal thickening and enhancement along the posterior margin of the resection as noted previously, measuring about a cm in size. This could possibly represent a small amount of residual tumor but I do not think that is definite. It could represent benign dural thickening. Continued observation is warranted.   Past Medical History:  Diagnosis Date  . Brain tumor ([place]) 02/20/2013   brain tumor removed in March 2014, Dr Donald Pore  . Dizziness   . Enlarged prostate   . Headache(784.0)    scattered  . Meningioma ([place])    No Known Allergies  Current Outpatient Medications on File Prior to Visit  Medication Sig Dispense Refill  . aspirin EC 81 MG EC tablet Take 1 tablet (81 mg total) by mouth daily. 30 tablet 3  . clonazePAM (KLONOPIN) 0.5 MG tablet Take 1 tablet (0.5 mg total) by mouth 2 (two) times daily. Take 1 tablet twice per day for the next 7 days. 60 tablet 1  . dexamethasone (DECADRON) 1 MG tablet Take 1 tablet (1 mg total) by mouth daily. 30 tablet 1  . FIBER ADULT GUMMIES PO Take 1 tablet by mouth daily.    Marland Kitchen lamoTRIgine (LAMICTAL) 100 MG tablet TAKE 1 TABLET(100 MG) BY MOUTH TWICE DAILY 30 tablet 3  . levETIRAcetam (KEPPRA) 500 MG tablet TAKE 3 TABLETS(1500 MG) BY MOUTH TWICE DAILY 180 tablet 3  . LORazepam (ATIVAN) 1 MG tablet Take 1 tablet (1 mg total) by mouth every 8 (eight) hours as needed for seizure (for breakthrough seizures). 12 tablet 0  . Melatonin 5 MG CHEW Chew 3 tablets by mouth at bedtime  as needed (sleep).    . naproxen sodium (ALEVE) 220 MG tablet Take 440 mg by mouth as needed (pain or headache).    . phenytoin (DILANTIN) 100 MG ER capsule Take 2 capsules (200 mg total) by mouth at bedtime. 60 capsule 3  . prochlorperazine (COMPAZINE) 10 MG tablet Take 1 tablet (10 mg total) by mouth every 6 (six) hours as needed for nausea or vomiting. 30 tablet 1  . sucralfate (CARAFATE) 1  g tablet Take 1 tablet (1 g total) by mouth 4 (four) times daily -  with meals and at bedtime for 14 days. 56 tablet 0  . triamcinolone cream (KENALOG) 0.1 % Apply 1 application topically 2 (two) times daily. 30 g 1  . vitamin C (ASCORBIC ACID) 500 MG tablet Take 500 mg by mouth daily.     No current facility-administered medications on file prior to visit.     Observations/Objective: Awake, alert, oriented x3 Not in acute distress  Assessment and Plan: 1. Recurrent meningioma of the brain [facility]) Status post craniotomy Completed radiation in 03/2019  2. Seizure ([place]) Uncontrolled as he continues to have many seizures Currently on Lamictal, phenytoin, Klonopin, dexamethasone Followed by medical oncology  3. Left leg pain Motor deficits secondary to #1 Initiate Flexeril Continue PT - cyclobenzaprine (FLEXERIL) 10 MG tablet; Take 1 tablet (10 mg total) by mouth 2 (two) times daily as needed for muscle spasms.  Dispense: 60 tablet; Refill: 1   Follow Up Instructions: 3 months, in person   I discussed the assessment and treatment plan with the patient. The patient was provided an opportunity to ask questions and all were answered. The patient agreed with the plan and demonstrated an understanding of the instructions.   The patient was advised to call back or seek an in-person evaluation if the symptoms worsen or if the condition fails to improve as anticipated.     I provided 21 minutes total of non-face-to-face time during this encounter including median intraservice time, reviewing previous notes, labs, imaging, medications, management and patient verbalized understanding.     Charlott Rakes, MD, FAAFP. [facility] and [place], [place]   06/03/2019, 1:37 PM

## 2019-06-05 ENCOUNTER — Ambulatory Visit: Payer: Medicaid Other

## 2019-06-05 ENCOUNTER — Other Ambulatory Visit: Payer: Self-pay

## 2019-06-05 DIAGNOSIS — M6281 Muscle weakness (generalized): Secondary | ICD-10-CM

## 2019-06-05 DIAGNOSIS — R2689 Other abnormalities of gait and mobility: Secondary | ICD-10-CM | POA: Diagnosis not present

## 2019-06-05 NOTE — Progress Notes (Signed)
  Radiation Oncology         [phone] ________________________________  Name: Joshua [name] MRN: [id number]  Date: 04/05/2019  DOB: January 17, 1961  End of Treatment Note  Diagnosis:   Recurrent Grade 1 meningioma     Indication for treatment::  curative       Radiation treatment dates:   02/20/19 - 04/05/19  Site/dose:   The patient was treated with a SIB technique to 60 Gy and 54 Gy in 30 fractions. This was completed with a 3-field VMAT technique (IMRT).    Narrative: The patient tolerated radiation treatment relatively well.   Seizures were managed closely by neuro-oncology.  Plan: The patient has completed radiation treatment. The patient will return to radiation oncology clinic for routine followup in one month. I advised the patient to call or return sooner if they have any questions or concerns related to their recovery or treatment. ________________________________  Jodelle Gross, M.D., Ph.D.

## 2019-06-05 NOTE — Therapy (Signed)
[facility] [address] [place], [place], [address] Phone: [phone]   Fax:  [phone]  Physical Therapy Treatment  Patient Details  Name: Joshua [name] MRN: [id number] Date of Birth: [birth year] Referring Provider (PT): Cecil Cobbs   Encounter Date: 06/05/2019  PT End of Session - 06/05/19 1151    Visit Number  2    Number of Visits  12    Date for PT Re-Evaluation  07/28/19    Authorization Type  Medicaid 3 visits 06/03/11/02/2019    Authorization - Visit Number  1    Authorization - Number of Visits  3    PT Start Time  [id number]    PT Stop Time  1229    PT Time Calculation (min)  41 min    Activity Tolerance  Patient tolerated treatment well    Behavior During Therapy  [facility] for tasks assessed/performed       Past Medical History:  Diagnosis Date  . Brain tumor ([place]) 02/20/2013   brain tumor removed in March 2014, Dr Donald Pore  . Dizziness   . Enlarged prostate   . Headache(784.0)    scattered  . Meningioma [facility])     Past Surgical History:  Procedure Laterality Date  . APPLICATION OF CRANIAL NAVIGATION N/A 01/10/2019   Procedure: APPLICATION OF CRANIAL NAVIGATION;  Surgeon: Erline Levine, MD;  Location: [facility];  Service: Neurosurgery;  Laterality: N/A;  . CATARACT EXTRACTION Right   . CRANIOTOMY Right 11/06/2012   Procedure: CRANIOTOMY TUMOR EXCISION;  Surgeon: Erline Levine, MD;  Location: [place] NEUR[facility]S;  Service: Neurosurgery;  Laterality: Right;  Right Parasagittal craniotomy for meningioma with Stealth  . CRANIOTOMY Right 01/10/2019   Procedure: Right Parasagittal Craniotomy for Tumor;  Surgeon: Erline Levine, MD;  Location: [place];  Service: Neurosurgery;  Laterality: Right;  Right parasagittal craniotomy for tumor  . ESOPHAGOGASTRODUODENOSCOPY    . right knee arthroscopy      There were no vitals filed for this visit.  Subjective Assessment - 06/05/19 1150    Subjective  Pt reports that he spoke to  PCP about some muscle cramping he is getting in lower legs and they called in flexeril. Has not picked up yet. Denies any falls.    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 History of right THA on 2019.    Patient Stated Goals  Pt would like to be able to elimate his cane use.    Currently in Pain?  No/denies    Pain Onset  More than a month ago                       [facility] Adult PT Treatment/Exercise - 06/05/19 1157      Ambulation/Gait   Ambulation/Gait  Yes    Ambulation/Gait Assistance  5: Supervision;4: Min guard    Ambulation/Gait Assistance Details  Verbal cues to increase foot clearance.     Ambulation Distance (Feet)  230 Feet    Assistive device  None    Gait Pattern  Step-through pattern;Decreased step length - right;Decreased step length - left;Decreased hip/knee flexion - right;Decreased hip/knee flexion - left;Decreased dorsiflexion - left    Ambulation Surface  Level;Indoor      Neuro Re-ed    Neuro Re-ed Details   In // bars: marching gait with 1 UE support with verbal cues for controlling movement 6' x  4, side stepping without UE support, tandem gait with 1 UE support 6' x 4. Standing on airex: feet apart eyes open x 30 sec, eyes closed x 30 sec, head turns up/down and left/right x 10 with CGA, standing with feet together x 30 sec.  Balance exercises in corner: tandem 30 sec each position with difficulty with left leg behind having to stagger some, standing on pillow feet together eyes open x 30 sec, head turns x 10 side to side and then feet apart eyes closed x 30 sec.  Supervision with all exercises for safety.             PT Education - 06/05/19 1549    Education Details  Initiated standing balance exercises    Person(s) Educated  Patient    Methods  Explanation;Handout;Demonstration    Comprehension   Verbalized understanding;Returned demonstration       PT Short Term Goals - 05/29/19 1940      PT SHORT TERM GOAL #1   Title  Pt will be independent with HEP to continue strength and balance gains on own.    Baseline  currently does not have an exercise program    Time  4    Period  Weeks    Status  New    Target Date  06/28/19      PT SHORT TERM GOAL #2   Title  Pt will ambulate >300' on level surfaces mod I for improved household and short community distances without AD    Baseline  currently 56' supervision with [facility]    Time  4    Period  Weeks    Status  New    Target Date  06/28/19      PT SHORT TERM GOAL #3   Title  Pt will increase gait speed from 0.23m/s to >0.51 m/s for improved gait safety.    Baseline  0.62m/s on 05/29/2019    Time  4    Period  Weeks    Status  New    Target Date  06/28/19        PT Long Term Goals - 05/29/19 1944      PT LONG TERM GOAL #1   Title  Pt will be independent with progressive HEP for strength and balance to continue gains on own.    Baseline  currently does not have HEP    Time  8    Period  Weeks    Status  New    Target Date  07/28/19      PT LONG TERM GOAL #2   Title  Pt will increase gait speed to >0.7 m/s for improved gait safety in community.    Baseline  0.37m/s on 05/29/2019    Time  8    Period  Weeks    Status  New    Target Date  07/28/19      PT LONG TERM GOAL #3   Title  Pt will increase Berg score form 44/56 to >48/56 for improved balance and decreased fall risk.    Baseline  44/56 on 05/29/2019    Time  8    Period  Weeks    Status  New    Target Date  07/28/19      PT LONG TERM GOAL #4   Title  Pt will ambulate >500' on varied surfaces independently without AD for improved community mobility.    Baseline  74' with [facility] supervision.    Time  8  Period  Weeks    Status  New    Target Date  07/28/19            Plan - 06/05/19 1552    Clinical Impression Statement  Pt was able to increase  gait distance today today. PT focused on establishing initial HEP for balance.    Examination-Activity Limitations  Locomotion Level;Stairs;Dressing    Examination-Participation Restrictions  Community Activity    Stability/Clinical Decision Making  Evolving/Moderate complexity    Rehab Potential  Good    PT Frequency  1x / week   plus eval, followed by 2x/week for 4 weeks   PT Duration  3 weeks   followed by 2x/week for 4 weeks   PT Treatment/Interventions  ADLs/Self Care Home Management;DME Instruction;Gait training;Stair training;Functional mobility training;Therapeutic activities;Patient/family education;Neuromuscular re-education;Balance training;Therapeutic exercise;Orthotic Fit/Training;Manual techniques;Passive range of motion    PT Next Visit Plan  Next visit gait training, balance training, strengthening focusing mostly on left LE.    Consulted and Agree with Plan of Care  Patient       Patient will benefit from skilled therapeutic intervention in order to improve the following deficits and impairments:  Abnormal gait, Decreased activity tolerance, Decreased balance, Difficulty walking, Decreased strength, Impaired sensation, Postural dysfunction, Impaired flexibility, Decreased mobility, Decreased range of motion  Visit Diagnosis: Other abnormalities of gait and mobility  Muscle weakness (generalized)     Problem List Patient Active Problem List   Diagnosis Date Noted  . 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[place]) 03/07/2019  . Seizures ([place]) 01/31/2019  . Meningioma, recurrent of brain ([place]) 01/31/2019  . Meningioma ([place]) 01/03/2019  . Brain tumor ([place]) 12/27/2018  . Diffuse idiopathic skeletal hyperostosis 06/05/2013    Electa Sniff, PT, DPT, NCS 06/05/2019, 3:58 PM  [facility] [address] [place], [place], [address] Phone: [phone]   Fax:  [phone]  Name: Joshua [name] MRN:  [id number] Date of Birth: [birth year]

## 2019-06-05 NOTE — Patient Instructions (Signed)
Access Code: [email or web address]  URL: [email or web address]  Date: 06/05/2019  Prepared by: Cherly [facility]   Exercises Tandem Stance - 3 reps - 1 sets - 30 sec hold - 2x daily - 7x weekly Walking March - 3 reps - 1 sets - 1x daily - 7x weekly Tandem Walking with Counter Support - 3 reps - 1 sets - 2x daily - 7x weekly Side Stepping with Counter Support - 3 reps - 1 sets - 2x daily - 7x weekly Romberg Stance on Foam Pad - 3 reps - 1 sets - 30 sec hold - 2x daily - 7x weekly Romberg Stance on Foam Pad with Head Rotation - 10 reps - 1 sets - 2x daily - 7x weekly Standing Balance with Eyes Closed on Foam - 3 reps - 1 sets - 30 sec hold - 2x daily - 7x weekly

## 2019-06-10 ENCOUNTER — Ambulatory Visit: Payer: Medicaid Other | Admitting: Physical Therapy

## 2019-06-17 ENCOUNTER — Ambulatory Visit: Payer: Medicaid Other

## 2019-06-17 ENCOUNTER — Other Ambulatory Visit: Payer: Self-pay

## 2019-06-17 ENCOUNTER — Telehealth: Payer: Self-pay | Admitting: *Deleted

## 2019-06-17 DIAGNOSIS — M6281 Muscle weakness (generalized): Secondary | ICD-10-CM

## 2019-06-17 DIAGNOSIS — R2689 Other abnormalities of gait and mobility: Secondary | ICD-10-CM | POA: Diagnosis not present

## 2019-06-17 NOTE — Therapy (Addendum)
[facility] [address] [place], [place], [address] Phone: [phone]   Fax:  [phone]  Physical Therapy Treatment  Patient Details  Name: Joshua [name] MRN: [id number] Date of Birth: 1961/05/27 Referring Provider (PT): Cecil Cobbs   Encounter Date: 06/17/2019  PT End of Session - 06/17/19 0716    Visit Number  3    Number of Visits  12    Date for PT Re-Evaluation  07/28/19    Authorization Type  Medicaid 3 visits 06/03/11/02/2019    Authorization - Visit Number  2    Authorization - Number of Visits  3    PT Start Time  0715    PT Stop Time  [id number]    PT Time Calculation (min)  40 min    Activity Tolerance  Patient tolerated treatment well    Behavior During Therapy  [facility] for tasks assessed/performed       Past Medical History:  Diagnosis Date  . Brain tumor ([place]) 02/20/2013   brain tumor removed in March 2014, Dr Donald Pore  . Dizziness   . Enlarged prostate   . Headache(784.0)    scattered  . Meningioma [facility])     Past Surgical History:  Procedure Laterality Date  . APPLICATION OF CRANIAL NAVIGATION N/A 01/10/2019   Procedure: APPLICATION OF CRANIAL NAVIGATION;  Surgeon: Erline Levine, MD;  Location: [facility];  Service: Neurosurgery;  Laterality: N/A;  . CATARACT EXTRACTION Right   . CRANIOTOMY Right 11/06/2012   Procedure: CRANIOTOMY TUMOR EXCISION;  Surgeon: Erline Levine, MD;  Location: [place] NEURO ORS;  Service: Neurosurgery;  Laterality: Right;  Right Parasagittal craniotomy for meningioma with Stealth  . CRANIOTOMY Right 01/10/2019   Procedure: Right Parasagittal Craniotomy for Tumor;  Surgeon: Erline Levine, MD;  Location: [place];  Service: Neurosurgery;  Laterality: Right;  Right parasagittal craniotomy for tumor  . ESOPHAGOGASTRODUODENOSCOPY    . right knee arthroscopy      There were no vitals filed for this visit.  Subjective Assessment - 06/17/19 0717    Subjective  Pt reports that he did pick  up his flexeril and he has been doing ok with the muscle cramping. Reports that he [place] some walking [place] the mall.    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 History of right THA on 2019.    Patient Stated Goals  Pt would like to be able to elimate his cane use.    Currently in Pain?  No/denies    Pain Onset  More than a month ago                       [facility] Adult PT Treatment/Exercise - 06/17/19 0717      Ambulation/Gait   Ambulation/Gait  Yes    Ambulation/Gait Assistance  5: Supervision    Ambulation/Gait Assistance Details  Verbal cues to increase foot clearance    Ambulation Distance (Feet)  345 Feet    Assistive device  None    Gait Pattern  Step-through pattern;Decreased step length - right;Decreased step length - left;Decreased dorsiflexion - left    Ambulation Surface  Level;Indoor      Neuro Re-ed    Neuro Re-ed Details   At counter: reciprocal steps over 5 cones with 1 UE support at times to encourage increased foot clearance x 4 laps. Unsteady at times. Side stepping over 5 cones  x 4 laps with occasional UE support, Alternating toe taps on cone without UE support x 10 with verbal cues for slow, controlled  movements. Standing on airex with feet together with eyes open x 30 sec and eyes closed x 30 sec with increased sway without UE support, feet together with head turns left/right and up/down x 10 each, marching on airex x 10 without UE support with cues for slow controlled movements.      Exercises   Exercises  Other Exercises    Other Exercises   Mini-squats x 10 with verbal cues for form, side steps in to squat along counter x 2 laps with cues to try to equalize WB as was shifting to right a little more. Sit to stand with 4" step under right foot to faciliate weight shift x 10. Leg press x 10 at 70# with verbal cues for  form              PT Education - 06/17/19 0756    Education Details  Added mini-squats to HEP    Person(s) Educated  Patient    Methods  Explanation;Demonstration;Handout    Comprehension  Verbalized understanding       PT Short Term Goals - 05/29/19 1940      PT SHORT TERM GOAL #1   Title  Pt will be independent with HEP to continue strength and balance gains on own.    Baseline  addended 06/24/2019 HEP has been initiated.   Time  4    Period  Weeks    Status  New    Target Date  06/28/19      PT SHORT TERM GOAL #2   Title  Pt will ambulate >300' on level surfaces mod I for improved household and short community distances without AD    Baseline 345' without AD supervision   Time  4    Period  Weeks    Status  New    Target Date  06/28/19      PT SHORT TERM GOAL #3   Title  Pt will increase gait speed from 0.68m/s to >0.51 m/s for improved gait safety.    Baseline  0.70m/s on 05/29/2019    Time  4    Period  Weeks    Status  New    Target Date  06/28/19        PT Long Term Goals - 05/29/19 1944      PT LONG TERM GOAL #1   Title  Pt will be independent with progressive HEP for strength and balance to continue gains on own.    Baseline  currently does not have HEP    Time  8    Period  Weeks    Status  New    Target Date  07/28/19      PT LONG TERM GOAL #2   Title  Pt will increase gait speed to >0.7 m/s for improved gait safety in community.    Baseline  0.28m/s on 05/29/2019    Time  8    Period  Weeks    Status  New    Target Date  07/28/19      PT LONG TERM GOAL #3   Title  Pt will increase Berg score form 44/56 to >48/56 for improved balance and decreased fall risk.    Baseline  44/56 on 05/29/2019    Time  8    Period  Weeks    Status  New  Target Date  07/28/19      PT LONG TERM GOAL #4   Title  Pt will ambulate >500' on varied surfaces independently without AD for improved community mobility.    Baseline  32' with [facility] supervision.     Time  8    Period  Weeks    Status  New    Target Date  07/28/19            Plan - 06/17/19 0933    Clinical Impression Statement  PT focused on trying to increased left weight shift with activities today to help with qait. Pt's left leg did fatigue with strengthening activities. Addendum 06/24/2019 as missed 3rd visit: Pt has been able to increase gait distance. Initial HEP started. Will benefit from continued PT to continue to progress gait, balance and strength.   Examination-Activity Limitations  Locomotion Level;Stairs;Dressing    Examination-Participation Restrictions  Community Activity    Stability/Clinical Decision Making  Evolving/Moderate complexity    Rehab Potential  Good    PT Frequency  1x / week   plus eval, followed by 2x/week for 4 weeks   PT Duration  3 weeks   followed by 2x/week for 4 weeks   PT Treatment/Interventions  ADLs/Self Care Home Management;DME Instruction;Gait training;Stair training;Functional mobility training;Therapeutic activities;Patient/family education;Neuromuscular re-education;Balance training;Therapeutic exercise;Orthotic Fit/Training;Manual techniques;Passive range of motion    PT Next Visit Plan  Next visit gait training, balance training, strengthening focusing mostly on left LE, assess STG progress thus far as need to request extended medicaid visits.    Consulted and Agree with Plan of Care  Patient       Patient will benefit from skilled therapeutic intervention in order to improve the following deficits and impairments:  Abnormal gait, Decreased activity tolerance, Decreased balance, Difficulty walking, Decreased strength, Impaired sensation, Postural dysfunction, Impaired flexibility, Decreased mobility, Decreased range of motion  Visit Diagnosis: Other abnormalities of gait and mobility  Muscle weakness (generalized)     Problem List Patient Active Problem List   Diagnosis Date Noted  . Malignant meningioma of meninges of  brain ([place]) 03/07/2019  . Seizures ([place]) 01/31/2019  . Meningioma, recurrent of brain ([place]) 01/31/2019  . Meningioma ([place]) 01/03/2019  . Brain tumor ([place]) 12/27/2018  . Diffuse idiopathic skeletal hyperostosis 06/05/2013    Electa Sniff, PT, DPT, NCS 06/17/2019, 9:36 AM  [facility] [address] [place], [place], [address] Phone: [phone]   Fax:  [phone]  Name: Joshua [name] MRN: [id number] Date of Birth: 31-Jan-1961

## 2019-06-17 NOTE — Telephone Encounter (Signed)
Faxed signed medical clearance form to [employer].

## 2019-06-17 NOTE — Patient Instructions (Signed)
Access Code: [email or web address]  URL: [email or web address]  Date: 06/17/2019  Prepared by: Cherly [facility]   Exercises Tandem Stance - 3 reps - 1 sets - 30 sec hold - 2x daily - 7x weekly Walking March - 3 reps - 1 sets - 1x daily - 7x weekly Tandem Walking with Counter Support - 3 reps - 1 sets - 2x daily - 7x weekly Side Stepping with Counter Support - 3 reps - 1 sets - 2x daily - 7x weekly Romberg Stance on Foam Pad - 3 reps - 1 sets - 30 sec hold - 2x daily - 7x weekly Romberg Stance on Foam Pad with Head Rotation - 10 reps - 1 sets - 2x daily - 7x weekly Standing Balance with Eyes Closed on Foam - 3 reps - 1 sets - 30 sec hold - 2x daily - 7x weekly Mini Squat with Counter Support - 10 reps - 2 sets - 2x daily - 7x weekly

## 2019-06-24 ENCOUNTER — Ambulatory Visit: Payer: Medicaid Other

## 2019-06-25 ENCOUNTER — Inpatient Hospital Stay: Payer: Medicaid Other | Attending: Internal Medicine | Admitting: Internal Medicine

## 2019-06-25 ENCOUNTER — Other Ambulatory Visit: Payer: Self-pay

## 2019-06-25 VITALS — BP 134/95 | HR 92 | Temp 99.1°F | Resp 18 | Ht 69.0 in | Wt 203.1 lb

## 2019-06-25 DIAGNOSIS — Z79899 Other long term (current) drug therapy: Secondary | ICD-10-CM | POA: Diagnosis not present

## 2019-06-25 DIAGNOSIS — D320 Benign neoplasm of cerebral meninges: Secondary | ICD-10-CM | POA: Insufficient documentation

## 2019-06-25 DIAGNOSIS — R569 Unspecified convulsions: Secondary | ICD-10-CM | POA: Diagnosis not present

## 2019-06-25 DIAGNOSIS — F17210 Nicotine dependence, cigarettes, uncomplicated: Secondary | ICD-10-CM | POA: Insufficient documentation

## 2019-06-25 MED ORDER — LEVETIRACETAM 500 MG PO TABS: ORAL_TABLET | ORAL | 1 refills | Status: DC

## 2019-06-25 MED ORDER — LAMOTRIGINE 100 MG PO TABS: ORAL_TABLET | ORAL | 2 refills | Status: DC

## 2019-06-25 MED ORDER — PHENYTOIN SODIUM EXTENDED 300 MG PO CAPS: 300.0000 mg | ORAL_CAPSULE | Freq: Every day | ORAL | 3 refills | Status: DC

## 2019-06-25 NOTE — Progress Notes (Signed)
[facility] [place], [place] [address] [phone]   Interval Evaluation  Date of Service: 06/25/19 Patient Name: Joshua [name] Patient MRN: [id number] Patient DOB: [birth year] Provider: [name], MD  Identifying Statement:  Joshua [name] is a 58 y.o. 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 Vertell Limber (WHO I) 01/10/19: Repeat craniotomy, debulking resection by Dr. Vertell Limber after tumor recurrence, seizures.  Path demonstrates islands of anaplasia c/w grade II/III. 04/05/19: Completes post-operative IMRT with Dr. Lisbeth Renshaw  Interval History:  Joshua [name] presents today for follow up after recent medication changes.  He describes continued numbness of the left leg, although he has improved with recent physical therapy. He did experience a seizure event this morning (48min stereotypical left leg twitching), and has been experiencing ~1 event per week since our visit last month.  Mood swings and agitation are still a problem at times. Otherwise no new or progressive neurologic deficits.  H+P (02/05/19) Patient presents to review clinical course and care for his meningioma.  Initially he presented in 2014 with headache syndrome, was found to have tumor on imaging which was resected by Dr. Vertell Limber.  The patient was lost to follow up for ~5 years until he developed seizures, described as "twitching of left leg spreading to arm" this past month.  He required loads of Keppra and Dilantin to break events.  MRI demonstrated significant regrowth of the mass, and repeat craniotomy was performed on 01/10/19.  Small amount of residual tumor was visible on post-operative MRI.  Since surgery he has continued to experience numbness and clumsiness of his left leg, requiring cane for ambulation.  He is still pending home physical therapy evaluation.  He has  had "small" seizures, consisting of few seconds of shaking of left leg, occurring less than daily.  Continues on Keppra and Dilantin.    Medications: Current Outpatient Medications on File Prior to Visit  Medication Sig Dispense Refill  . aspirin EC 81 MG EC tablet Take 1 tablet (81 mg total) by mouth daily. 30 tablet 3  . clonazePAM (KLONOPIN) 0.5 MG tablet Take 1 tablet (0.5 mg total) by mouth 2 (two) times daily. Take 1 tablet twice per day for the next 7 days. 60 tablet 1  . cyclobenzaprine (FLEXERIL) 10 MG tablet Take 1 tablet (10 mg total) by mouth 2 (two) times daily as needed for muscle spasms. 60 tablet 1  . dexamethasone (DECADRON) 1 MG tablet Take 1 tablet (1 mg total) by mouth daily. 30 tablet 1  . FIBER ADULT GUMMIES PO Take 1 tablet by mouth daily.    Marland Kitchen lamoTRIgine (LAMICTAL) 100 MG tablet TAKE 1 TABLET(100 MG) BY MOUTH TWICE DAILY 30 tablet 3  . levETIRAcetam (KEPPRA) 500 MG tablet TAKE 3 TABLETS(1500 MG) BY MOUTH TWICE DAILY 180 tablet 3  . LORazepam (ATIVAN) 1 MG tablet Take 1 tablet (1 mg total) by mouth every 8 (eight) hours as needed for seizure (for breakthrough seizures). 12 tablet 0  . Melatonin 5 MG CHEW Chew 3 tablets by mouth at bedtime as needed (sleep).    . naproxen sodium (ALEVE) 220 MG tablet Take 440 mg by mouth as needed (pain or headache).    . phenytoin (DILANTIN) 100 MG ER capsule Take 2 capsules (200 mg total) by mouth at bedtime. 60 capsule 3  . prochlorperazine (COMPAZINE) 10 MG tablet Take 1 tablet (10  mg total) by mouth every 6 (six) hours as needed for nausea or vomiting. 30 tablet 1  . sucralfate (CARAFATE) 1 g tablet Take 1 tablet (1 g total) by mouth 4 (four) times daily -  with meals and at bedtime for 14 days. 56 tablet 0  . triamcinolone cream (KENALOG) 0.1 % Apply 1 application topically 2 (two) times daily. 30 g 1  . vitamin C (ASCORBIC ACID) 500 MG tablet Take 500 mg by mouth daily.     No current facility-administered medications on file prior  to visit.     Allergies: No Known Allergies Past Medical History:  Past Medical History:  Diagnosis Date  . Brain tumor ([place]) 02/20/2013   brain tumor removed in March 2014, Dr Donald Pore  . Dizziness   . Enlarged prostate   . Headache(784.0)    scattered  . Meningioma [facility])    Past Surgical History:  Past Surgical History:  Procedure Laterality Date  . APPLICATION OF CRANIAL NAVIGATION N/A 01/10/2019   Procedure: APPLICATION OF CRANIAL NAVIGATION;  Surgeon: Erline Levine, MD;  Location: [facility];  Service: Neurosurgery;  Laterality: N/A;  . CATARACT EXTRACTION Right   . CRANIOTOMY Right 11/06/2012   Procedure: CRANIOTOMY TUMOR EXCISION;  Surgeon: Erline Levine, MD;  Location: [facility] NEURO ORS;  Service: Neurosurgery;  Laterality: Right;  Right Parasagittal craniotomy for meningioma with Stealth  . CRANIOTOMY Right 01/10/2019   Procedure: Right Parasagittal Craniotomy for Tumor;  Surgeon: Erline Levine, MD;  Location: [facility];  Service: Neurosurgery;  Laterality: Right;  Right parasagittal craniotomy for tumor  . ESOPHAGOGASTRODUODENOSCOPY    . right knee arthroscopy     Social History:  Social History   Socioeconomic History  . Marital status: Married    Spouse name: Not on file  . Number of children: Not on file  . Years of education: Not on file  . Highest education level: Not on file  Occupational History  . Not on file  Social Needs  . Financial resource strain: Not on file  . Food insecurity    Worry: Not on file    Inability: Not on file  . Transportation needs    Medical: No    Non-medical: No  Tobacco Use  . Smoking status: Current Some Day Smoker    Types: Cigarettes  . Smokeless tobacco: Never Used  . Tobacco comment: hasn't smoked in a week  Substance and Sexual Activity  . Alcohol use: Yes    Alcohol/week: 8.0 standard drinks    Types: 6 Cans of beer, 2 Shots of liquor per week    Comment: occasionally  . Drug use: No  . Sexual activity: Yes  Lifestyle  .  Physical activity    Days per week: Not on file    Minutes per session: Not on file  . Stress: Not on file  Relationships  . Social Herbalist on phone: Not on file    Gets together: Not on file    Attends religious service: Not on file    Active member of club or organization: Not on file    Attends meetings of clubs or organizations: Not on file    Relationship status: Not on file  . Intimate partner violence    Fear of current or ex partner: Not on file    Emotionally abused: Not on file    Physically abused: Not on file    Forced sexual activity: Not on file  Other Topics Concern  .  Not on file  Social History Narrative  . Not on file   Family History:  Family History  Problem Relation Age of Onset  . Hypertension Mother   . Hypertension Father     Review of Systems: Constitutional: Denies fevers, chills or abnormal weight loss Eyes: Denies blurriness of vision Ears, nose, mouth, throat, and face: Denies mucositis or sore throat Respiratory: Denies cough, dyspnea or wheezes Cardiovascular: Denies palpitation, chest discomfort or lower extremity swelling Gastrointestinal:  Denies nausea, constipation, diarrhea GU: Denies dysuria or incontinence Skin: Denies abnormal skin rashes Neurological: Per HPI Musculoskeletal: Denies joint pain, back or neck discomfort. No decrease in ROM Behavioral/Psych: Denies anxiety, disturbance in thought content, and mood instability  Physical Exam: Vitals:   06/25/19 0940  BP: (!) 134/95  Pulse: 92  Resp: 18  Temp: 99.1 F (37.3 C)  SpO2: 97%   KPS: 80. General: Alert, cooperative, pleasant, in no acute distress Head: Craniotomy scar noted, dry and intact. EENT: No conjunctival injection or scleral icterus. Oral mucosa moist Lungs: Resp effort normal Cardiac: Regular rate and rhythm Abdomen: Soft, non-distended abdomen Skin: No rashes cyanosis or petechiae. Extremities: No clubbing or edema  Neurologic Exam:  Mental Status: Awake, alert, attentive to examiner. Oriented to self and environment. Language is fluent with intact comprehension.  Cranial Nerves: Visual acuity is grossly normal. Visual fields are full. Extra-ocular movements intact. No ptosis. Face is symmetric, tongue midline. Motor: Tone and bulk are normal.Power is impaired in left leg, 4+/5 distally. Reflexes are symmetric, no pathologic reflexes present. Impaired heel to shin left leg Sensory: Diminished to light touch and temp left lower leg Gait: Independent  Labs: I have reviewed the data as listed    Component Value Date/Time   NA 140 03/12/2019 1502   K 4.1 03/12/2019 1502   CL 104 03/12/2019 1502   CO2 27 03/12/2019 1502   GLUCOSE 145 (H) 03/12/2019 1502   BUN 14 03/12/2019 1502   CREATININE 0.83 03/12/2019 1502   CALCIUM 9.3 03/12/2019 1502   PROT 6.8 02/18/2019 0920   ALBUMIN 4.0 02/18/2019 0920   AST 29 02/18/2019 0920   ALT 41 02/18/2019 0920   ALKPHOS 96 02/18/2019 0920   BILITOT 0.4 02/18/2019 0920   GFRNONAA >60 03/12/2019 1502   GFRAA >60 03/12/2019 1502   Lab Results  Component Value Date   WBC 6.9 03/12/2019   NEUTROABS 3.9 03/12/2019   HGB 14.7 03/12/2019   HCT 42.7 03/12/2019   MCV 93.2 03/12/2019   PLT 224 03/12/2019    Assessment/Plan 1. Meningioma, recurrent of brain ([place])  2. Seizures [facility])  Mr. Woodroof is clinically stable today. Breakthrough seizures are still occurring but in much more limited fashion.  For refractory focal epilepsy we recommended the following changes: -Increase PHT 300mg  -Reduce dexamethasone to 0.5mg  daily x1 week, then STOP  Regarding additional AEDs: -Con't Keppra 1500mg  BID -Con't Clonazepam 0.5mg  BID -Continue Lamictal 100mg  BID  We appreciate the opportunity to participate in the care of Joshua [name].  We will call him in 2 weeks to continue adjustments to this regimen, with planned reduction Keppra and compensatory increase in Lamictal or PHT due  to mood effects.   All questions were answered. The patient knows to call the clinic with any problems, questions or concerns. No barriers to learning were detected.  He should return to clinic in 1 month for epilepsy follow up and further steroid taper instructions.  Next brain MRI should be in 2 months.  The total time spent in the encounter was 25 minutes and more than 50% was on counseling and review of test results   [name], MD Medical Director of Neuro-Oncology [facility] 06/25/19 9:40 AM

## 2019-06-26 ENCOUNTER — Telehealth: Payer: Self-pay | Admitting: Internal Medicine

## 2019-06-26 NOTE — Telephone Encounter (Signed)
I left a message regarding phone visit

## 2019-06-27 ENCOUNTER — Other Ambulatory Visit: Payer: Self-pay

## 2019-06-27 ENCOUNTER — Ambulatory Visit: Payer: Medicaid Other | Attending: Internal Medicine

## 2019-06-27 DIAGNOSIS — R2689 Other abnormalities of gait and mobility: Secondary | ICD-10-CM | POA: Insufficient documentation

## 2019-06-27 DIAGNOSIS — M6281 Muscle weakness (generalized): Secondary | ICD-10-CM

## 2019-06-27 NOTE — Therapy (Signed)
[facility] [address] [place], [place], [address] Phone: [phone]   Fax:  [phone]  Physical Therapy Treatment  Patient Details  Name: Joshua [name] MRN: [id number] Date of Birth: 15-May-1961 Referring Provider (PT): Cecil Cobbs   Encounter Date: 06/27/2019  PT End of Session - 06/27/19 0092    Visit Number  4    Number of Visits  12    Date for PT Re-Evaluation  07/28/19    Authorization Type  Medicaid 3 visits 06/03/11/02/2019, 12/10-12/30/20 6 units    Authorization - Visit Number  3    Authorization - Number of Visits  8    PT Start Time  0933    PT Stop Time  1013    PT Time Calculation (min)  40 min    Activity Tolerance  Patient tolerated treatment well    Behavior During Therapy  [facility] for tasks assessed/performed       Past Medical History:  Diagnosis Date  . Brain tumor ([place]) 02/20/2013   brain tumor removed in March 2014, Dr Donald Pore  . Dizziness   . Enlarged prostate   . Headache(784.0)    scattered  . Meningioma [facility])     Past Surgical History:  Procedure Laterality Date  . APPLICATION OF CRANIAL NAVIGATION N/A 01/10/2019   Procedure: APPLICATION OF CRANIAL NAVIGATION;  Surgeon: Erline Levine, MD;  Location: [facility];  Service: Neurosurgery;  Laterality: N/A;  . CATARACT EXTRACTION Right   . CRANIOTOMY Right 11/06/2012   Procedure: CRANIOTOMY TUMOR EXCISION;  Surgeon: Erline Levine, MD;  Location: [facility] NEURO ORS;  Service: Neurosurgery;  Laterality: Right;  Right Parasagittal craniotomy for meningioma with Stealth  . CRANIOTOMY Right 01/10/2019   Procedure: Right Parasagittal Craniotomy for Tumor;  Surgeon: Erline Levine, MD;  Location: [facility];  Service: Neurosurgery;  Laterality: Right;  Right parasagittal craniotomy for tumor  . ESOPHAGOGASTRODUODENOSCOPY    . right knee arthroscopy      There were no vitals filed for this visit.  Subjective Assessment - 06/27/19 0936    Subjective  Pt  reports that they will be adjusting his dilantin down some soon. He reports he is on a steriod and they are also adjusting that as well.    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 History of right THA on 2019.    Patient Stated Goals  Pt would like to be able to elimate his cane use.    Currently in Pain?  No/denies    Pain Onset  More than a month ago                       [facility] Adult PT Treatment/Exercise - 06/27/19 0953      Ambulation/Gait   Ambulation/Gait  Yes    Ambulation/Gait Assistance  5: Supervision    Ambulation/Gait Assistance Details  Verbal cues to increase left heel strike    Ambulation Distance (Feet)  345 Feet    Assistive device  None    Gait Pattern  Step-through pattern;Decreased dorsiflexion - left;Poor foot clearance - left   left foot inverts at times   Ambulation Surface  Level;Indoor    Gait velocity  14.36 comfortable=0.64ms and 11.35 fast=.0.841m    Gait Comments  Trialed PLS AFO on left . Attempted the ottobock posterior AFO with medial support but would not fit in shoe. Pt going to  bring other sneakers next time. Pt ambulated 230' with PLS AFO and noted improvement in left foot clearance with no inversion.      Exercises   Exercises  Other Exercises    Other Exercises   Left ankle AAROM DF with red theraband x 10 with resisted PF against band on return down. Left foot on towel with left foot eversion x 10 only minimal range. Verbal cues to not move knee.             PT Education - 06/27/19 1133    Education Details  Added ankle exercises to HEP    Person(s) Educated  Patient    Methods  Explanation;Handout;Demonstration    Comprehension  Verbalized understanding;Returned demonstration       PT Short Term Goals - 06/27/19 1134      PT SHORT TERM GOAL #1   Title  Pt will be independent  with HEP to continue strength and balance gains on own.    Baseline  Pt has been performing initial HEP    Time  4    Period  Weeks    Status  Achieved    Target Date  06/28/19      PT SHORT TERM GOAL #2   Title  Pt will ambulate >300' on level surfaces mod I for improved household and short community distances without AD    Baseline  345' without AD supervision    Time  4    Period  Weeks    Status  Partially Met    Target Date  06/28/19      PT SHORT TERM GOAL #3   Title  Pt will increase gait speed from 0.34ms to >0.51 m/s for improved gait safety.    Baseline  0.636m on 06/27/2019    Time  4    Period  Weeks    Status  Achieved    Target Date  06/28/19        PT Long Term Goals - 05/29/19 1944      PT LONG TERM GOAL #1   Title  Pt will be independent with progressive HEP for strength and balance to continue gains on own.    Baseline  currently does not have HEP    Time  8    Period  Weeks    Status  New    Target Date  07/28/19      PT LONG TERM GOAL #2   Title  Pt will increase gait speed to >0.7 m/s for improved gait safety in community.    Baseline  0.4165mon 05/29/2019    Time  8    Period  Weeks    Status  New    Target Date  07/28/19      PT LONG TERM GOAL #3   Title  Pt will increase Berg score form 44/56 to >48/56 for improved balance and decreased fall risk.    Baseline  44/56 on 05/29/2019    Time  8    Period  Weeks    Status  New    Target Date  07/28/19      PT LONG TERM GOAL #4   Title  Pt will ambulate >500' on varied surfaces independently without AD for improved community mobility.    [facility]  [facility]    Time  8    Period  Weeks    Status  New    Target Date  07/28/19  Plan - 06/27/19 1136    Clinical Impression Statement  Pt able to increase gait distance today showing improving activity tolerance. PT did note more issues with left foot clearance. Trialed PLS AFO and had improvement. Will try  again next visit and decide if need to pursue to improve safety with gait. Pt showed significant improvement in gait speed meeting initial gait speed goal ambulating at safer speed for household ambulator. Pt will benefit from continued PT to continue to work on strength, balance and gait.    Examination-Activity Limitations  Locomotion Level;Stairs;Dressing    Examination-Participation Restrictions  Community Activity    Stability/Clinical Decision Making  Evolving/Moderate complexity    Rehab Potential  Good    PT Frequency  1x / week   plus eval, followed by 2x/week for 4 weeks   PT Duration  3 weeks   followed by 2x/week for 4 weeks   PT Treatment/Interventions  ADLs/Self Care Home Management;DME Instruction;Gait training;Stair training;Functional mobility training;Therapeutic activities;Patient/family education;Neuromuscular re-education;Balance training;Therapeutic exercise;Orthotic Fit/Training;Manual techniques;Passive range of motion    PT Next Visit Plan  Next visit gait training, balance training, strengthening focusing mostly on left LE. How are ankle exercises going. Trial ottobox posterior AFO with medial support if brings other shoes.    Consulted and Agree with Plan of Care  Patient       Patient will benefit from skilled therapeutic intervention in order to improve the following deficits and impairments:  Abnormal gait, Decreased activity tolerance, Decreased balance, Difficulty walking, Decreased strength, Impaired sensation, Postural dysfunction, Impaired flexibility, Decreased mobility, Decreased range of motion  Visit Diagnosis: Other abnormalities of gait and mobility  Muscle weakness (generalized)     Problem List Patient Active Problem List   Diagnosis Date Noted  . 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[place]) 03/07/2019  . Seizures ([place]) 01/31/2019  . Meningioma, recurrent of brain ([place]) 01/31/2019  . Meningioma ([place]) 01/03/2019  . Brain tumor ([place])  12/27/2018  . Diffuse idiopathic skeletal hyperostosis 06/05/2013    Electa Sniff, PT, DPT, NCS 06/27/2019, 11:39 AM  [facility] [address] [place], [place], [address] Phone: [phone]   Fax:  [phone]  Name: Joshua [name] MRN: [id number] Date of Birth: May 06, 1961

## 2019-06-27 NOTE — Patient Instructions (Signed)
Access Code: [email or web address]  URL: [email or web address]  Date: 06/27/2019  Prepared by: Cherly Anderson   Exercises Tandem Stance - 3 reps - 1 sets - 30 sec hold - 2x daily - 7x weekly Walking March - 3 reps - 1 sets - 1x daily - 7x weekly Tandem Walking with Counter Support - 3 reps - 1 sets - 2x daily - 7x weekly Side Stepping with Counter Support - 3 reps - 1 sets - 2x daily - 7x weekly Romberg Stance on Foam Pad - 3 reps - 1 sets - 30 sec hold - 2x daily - 7x weekly Romberg Stance on Foam Pad with Head Rotation - 10 reps - 1 sets - 2x daily - 7x weekly Standing Balance with Eyes Closed on Foam - 3 reps - 1 sets - 30 sec hold - 2x daily - 7x weekly Mini Squat with Counter Support - 10 reps - 2 sets - 2x daily - 7x weekly Active assist ankle DF then resisted PF - 10 reps - 3 sets - 2x daily - 7x weekly Ankle Inversion Eversion Towel Slide - 10 reps - 3 sets - 1x daily - 7x weekly

## 2019-06-28 ENCOUNTER — Telehealth: Payer: Self-pay

## 2019-06-28 NOTE — Telephone Encounter (Signed)
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  Pt will benefit from use of left AFO in order to improve safety with functional mobility.    If you agree, please submit request in EPIC under MD Order, Other Orders (list left AFO in comments) or fax to [facility] at [phone].   Thank you, Cherly Anderson, PT, DPT, [facility] [address] [place], [place]  [address] Phone:  [phone] Fax:  [phone]

## 2019-07-01 ENCOUNTER — Ambulatory Visit: Payer: Medicaid Other

## 2019-07-01 ENCOUNTER — Telehealth: Payer: Self-pay | Admitting: *Deleted

## 2019-07-01 ENCOUNTER — Other Ambulatory Visit: Payer: Self-pay

## 2019-07-01 DIAGNOSIS — R2689 Other abnormalities of gait and mobility: Secondary | ICD-10-CM

## 2019-07-01 DIAGNOSIS — M6281 Muscle weakness (generalized): Secondary | ICD-10-CM | POA: Diagnosis not present

## 2019-07-01 NOTE — Telephone Encounter (Signed)
Order for Left AFO indicated for Hemiparesis, impaired mobility faxed to [phone].

## 2019-07-01 NOTE — Therapy (Signed)
[facility] [address] [place], [place], [address] Phone: [phone]   Fax:  [phone]  Physical Therapy Treatment  Patient Details  Name: Joshua [name] MRN: [id number] Date of Birth: Mar 31, 1961 Referring Provider (PT): Cecil Cobbs   Encounter Date: 07/01/2019  PT End of Session - 07/01/19 0714    Visit Number  5    Number of Visits  12    Date for PT Re-Evaluation  07/28/19    Authorization Type  Medicaid 3 visits 06/03/11/02/2019, 12/10-12/30/20 6 units    Authorization - Visit Number  4    Authorization - Number of Visits  8    PT Start Time  0710    PT Stop Time  0753    PT Time Calculation (min)  43 min    Activity Tolerance  Patient tolerated treatment well    Behavior During Therapy  [facility] for tasks assessed/performed       Past Medical History:  Diagnosis Date  . Brain tumor ([place]) 02/20/2013   brain tumor removed in March 2014, Dr Donald Pore  . Dizziness   . Enlarged prostate   . Headache(784.0)    scattered  . Meningioma [facility])     Past Surgical History:  Procedure Laterality Date  . APPLICATION OF CRANIAL NAVIGATION N/A 01/10/2019   Procedure: APPLICATION OF CRANIAL NAVIGATION;  Surgeon: Erline Levine, MD;  Location: [place];  Service: Neurosurgery;  Laterality: N/A;  . CATARACT EXTRACTION Right   . CRANIOTOMY Right 11/06/2012   Procedure: CRANIOTOMY TUMOR EXCISION;  Surgeon: Erline Levine, MD;  Location: [place] [facility]UR[facility]S;  Service: Neurosurgery;  Laterality: Right;  Right Parasagittal craniotomy for meningioma with Stealth  . CRANIOTOMY Right 01/10/2019   Procedure: Right Parasagittal Craniotomy for Tumor;  Surgeon: Erline Levine, MD;  Location: [place];  Service: Neurosurgery;  Laterality: Right;  Right parasagittal craniotomy for tumor  . ESOPHAGOGASTRODUODENOSCOPY    . right knee arthroscopy      There were no vitals filed for this visit.  Subjective Assessment - 07/01/19 0714    Subjective  Pt  reports that he is doing well.    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 History of right THA on 2019.    Patient Stated Goals  Pt would like to be able to elimate his cane use.    Currently in Pain?  No/denies    Pain Onset  More than a month ago                       [facility] Adult PT Treatment/Exercise - 07/01/19 0714      Ambulation/Gait   Ambulation/Gait  Yes    Ambulation/Gait Assistance  5: Supervision    Ambulation/Gait Assistance Details  Verbal cues to increase step length and left heel strike    Ambulation Distance (Feet)  345 Feet    Assistive device  None   left PLS AFO   Gait Pattern  Step-through pattern    Ambulation Surface  Level;Indoor    Stairs  Yes    Stairs Assistance  6: Modified independent (Device/Increase time)    Stair Management Technique  Two rails;Alternating pattern;Step to pattern    Number of Stairs  4    Gait Comments  Trialed ottobock left AFO with medial support but was too large and not helping to clear enough. Donned PLS AFO and improved gait  quality.      Neuro Re-ed    Neuro Re-ed Details   Marching gait along counter with only occasional UE support 6' x 6. Pt had decreased stability with left SLS. Pt performed side stepping along counter without UE support with quick step when bringing trailing leg over 6'  x 4.  Step-ups lateral with left LE on 4" step x 10 and then forward x 10 each leg.  Standing on rockerboard ant/post maintaining board level x 30 sec eyes open and x 30 sec eyes closed CGA with occasional LOB at first, maintaining level with arm movements up/out/across chest x 10. Then performed standing with board positioned side to side maintaining level x 30 sec. Pt challenged with lateral positioning.             PT Education - 07/01/19 0757    Education Details  Pt to continue  with current HEP    Person(s) Educated  Patient    Methods  Explanation    Comprehension  Verbalized understanding       PT Short Term Goals - 06/27/19 1134      PT SHORT TERM GOAL #1   Title  Pt will be independent with HEP to continue strength and balance gains on own.    Baseline  Pt has been performing initial HEP    Time  4    Period  Weeks    Status  Achieved    Target Date  06/28/19      PT SHORT TERM GOAL #2   Title  Pt will ambulate >300' on level surfaces mod I for improved household and short community distances without AD    Baseline  345' without AD supervision    Time  4    Period  Weeks    Status  Partially Met    Target Date  06/28/19      PT SHORT TERM GOAL #3   Title  Pt will increase gait speed from 0.40ms to >0.51 m/s for improved gait safety.    Baseline  0.639m on 06/27/2019    Time  4    Period  Weeks    Status  Achieved    Target Date  06/28/19        PT Long Term Goals - 05/29/19 1944      PT LONG TERM GOAL #1   Title  Pt will be independent with progressive HEP for strength and balance to continue gains on own.    Baseline  currently does not have HEP    Time  8    Period  Weeks    Status  New    Target Date  07/28/19      PT LONG TERM GOAL #2   Title  Pt will increase gait speed to >0.7 m/s for improved gait safety in community.    Baseline  0.4139mon 05/29/2019    Time  8    Period  Weeks    Status  New    Target Date  07/28/19      PT LONG TERM GOAL #3   Title  Pt will increase Berg score form 44/56 to >48/56 for improved balance and decreased fall risk.    Baseline  44/56 on 05/29/2019    Time  8    Period  Weeks    Status  New    Target Date  07/28/19      PT LONG TERM GOAL #4   Title  Pt  will ambulate >500' on varied surfaces independently without AD for improved community mobility.    Baseline  66' with [facility] supervision.    Time  8    Period  Weeks    Status  New    Target Date  07/28/19            Plan -  07/01/19 0757    Clinical Impression Statement  Pt challenged with SLS activities on left as well as stepping up with less control utilizing pushing foot to help some. Pt donned left PLS AFO during most of treatment and did help with foot clearance.    Examination-Activity Limitations  Locomotion Level;Stairs;Dressing    Examination-Participation Restrictions  Community Activity    Stability/Clinical Decision Making  Evolving/Moderate complexity    Rehab Potential  Good    PT Frequency  1x / week   plus eval, followed by 2x/week for 4 weeks   PT Duration  3 weeks   followed by 2x/week for 4 weeks   PT Treatment/Interventions  ADLs/Self Care Home Management;DME Instruction;Gait training;Stair training;Functional mobility training;Therapeutic activities;Patient/family education;Neuromuscular re-education;Balance training;Therapeutic exercise;Orthotic Fit/Training;Manual techniques;Passive range of motion    PT Next Visit Plan  Next visit gait training, balance training, strengthening focusing mostly on left LE.    Recommended Other Services  Pt will contact orthotist about left AFO as received MD order for one.    Consulted and Agree with Plan of Care  Patient       Patient will benefit from skilled therapeutic intervention in order to improve the following deficits and impairments:  Abnormal gait, Decreased activity tolerance, Decreased balance, Difficulty walking, Decreased strength, Impaired sensation, Postural dysfunction, Impaired flexibility, Decreased mobility, Decreased range of motion  Visit Diagnosis: Other abnormalities of gait and mobility  Muscle weakness (generalized)     Problem List Patient Active Problem List   Diagnosis Date Noted  . 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[place]) 03/07/2019  . Seizures ([place]) 01/31/2019  . Meningioma, recurrent of brain ([place]) 01/31/2019  . Meningioma ([place]) 01/03/2019  . Brain tumor ([place]) 12/27/2018  . Diffuse idiopathic skeletal  hyperostosis 06/05/2013    Electa Sniff, PT, DPT, NCS 07/01/2019, 8:00 AM  [facility] [address] [place], [place], [address] Phone: [phone]   Fax:  [phone]  Name: Joshua [name] MRN: [id number] Date of Birth: 04/30/1961

## 2019-07-04 ENCOUNTER — Encounter (HOSPITAL_COMMUNITY): Payer: Self-pay | Admitting: Emergency Medicine

## 2019-07-04 ENCOUNTER — Emergency Department (HOSPITAL_COMMUNITY)
Admission: EM | Admit: 2019-07-04 | Discharge: 2019-07-04 | Disposition: A | Discharge status: Home / Self Care | Payer: Medicaid Other | Attending: Emergency Medicine | Admitting: Emergency Medicine

## 2019-07-04 ENCOUNTER — Other Ambulatory Visit: Payer: Self-pay

## 2019-07-04 ENCOUNTER — Ambulatory Visit: Payer: Medicaid Other

## 2019-07-04 DIAGNOSIS — I10 Essential (primary) hypertension: Secondary | ICD-10-CM | POA: Insufficient documentation

## 2019-07-04 DIAGNOSIS — Z5321 Procedure and treatment not carried out due to patient leaving prior to being seen by health care provider: Secondary | ICD-10-CM | POA: Insufficient documentation

## 2019-07-04 LAB — CBC WITH DIFFERENTIAL/PLATELET
Abs Immature Granulocytes: 0.02 10*3/uL (ref 0.00–0.07)
Basophils Absolute: 0.1 10*3/uL (ref 0.0–0.1)
Basophils Relative: 1 %
Eosinophils Absolute: 0.2 10*3/uL (ref 0.0–0.5)
Eosinophils Relative: 2 %
HCT: 49 % (ref 39.0–52.0)
Hemoglobin: 16.5 g/dL (ref 13.0–17.0)
Immature Granulocytes: 0 %
Lymphocytes Relative: 33 %
Lymphs Abs: 2.3 10*3/uL (ref 0.7–4.0)
MCH: 30.3 pg (ref 26.0–34.0)
MCHC: 33.7 g/dL (ref 30.0–36.0)
MCV: 90.1 fL (ref 80.0–100.0)
Monocytes Absolute: 0.8 10*3/uL (ref 0.1–1.0)
Monocytes Relative: 12 %
Neutro Abs: 3.5 10*3/uL (ref 1.7–7.7)
Neutrophils Relative %: 52 %
Platelets: 236 10*3/uL (ref 150–400)
RBC: 5.44 MIL/uL (ref 4.22–5.81)
RDW: 12.3 % (ref 11.5–15.5)
WBC: 6.8 10*3/uL (ref 4.0–10.5)
nRBC: 0 % (ref 0.0–0.2)

## 2019-07-04 LAB — BASIC METABOLIC PANEL
Anion gap: 10 (ref 5–15)
BUN: 10 mg/dL (ref 6–20)
CO2: 24 mmol/L (ref 22–32)
Calcium: 9.3 mg/dL (ref 8.9–10.3)
Chloride: 104 mmol/L (ref 98–111)
Creatinine, Ser: 0.9 mg/dL (ref 0.61–1.24)
GFR calc Af Amer: >60 mL/min (ref >60)
GFR calc non Af Amer: >60 mL/min (ref >60)
Glucose, Bld: 99 mg/dL (ref 70–99)
Potassium: 4 mmol/L (ref 3.5–5.1)
Sodium: 138 mmol/L (ref 135–145)

## 2019-07-04 NOTE — ED Triage Notes (Signed)
Pt sent from dentist office for evaluation of hypertension. Pt denies known hx. Denies chest pain/shortness of breath, c/o occasional headaches.

## 2019-07-04 NOTE — ED Notes (Signed)
Pt stated he can't wait any longer, is going to call PCP, this RN encouraged pt to stay and come back if needed.

## 2019-07-08 ENCOUNTER — Other Ambulatory Visit: Payer: Self-pay

## 2019-07-08 ENCOUNTER — Ambulatory Visit: Payer: Medicaid Other

## 2019-07-08 VITALS — BP 150/88

## 2019-07-08 DIAGNOSIS — M6281 Muscle weakness (generalized): Secondary | ICD-10-CM

## 2019-07-08 DIAGNOSIS — R2689 Other abnormalities of gait and mobility: Secondary | ICD-10-CM

## 2019-07-08 NOTE — Therapy (Signed)
[facility] [address] [place], [place], [address] Phone: [phone]   Fax:  [phone]  Physical Therapy Treatment  Patient Details  Name: Joshua [name] MRN: [id number] Date of Birth: 06-12-1961 Referring Provider (PT): Cecil Cobbs   Encounter Date: 07/08/2019  PT End of Session - 07/08/19 0716    Visit Number  6    Number of Visits  12    Date for PT Re-Evaluation  07/28/19    Authorization Type  Medicaid 3 visits 06/03/11/02/2019, 12/10-12/30/20 6 units    Authorization - Visit Number  5    Authorization - Number of Visits  8    PT Start Time  0712    PT Stop Time  0800    PT Time Calculation (min)  48 min    Activity Tolerance  Patient tolerated treatment well    Behavior During Therapy  [facility] for tasks assessed/performed       Past Medical History:  Diagnosis Date  . Brain tumor ([place]) 02/20/2013   brain tumor removed in March 2014, Dr Donald Pore  . Dizziness   . Enlarged prostate   . Headache(784.0)    scattered  . Meningioma [facility])     Past Surgical History:  Procedure Laterality Date  . APPLICATION OF CRANIAL NAVIGATION N/A 01/10/2019   Procedure: APPLICATION OF CRANIAL NAVIGATION;  Surgeon: Erline Levine, MD;  Location: [place];  Service: Neurosurgery;  Laterality: N/A;  . CATARACT EXTRACTION Right   . CRANIOTOMY Right 11/06/2012   Procedure: CRANIOTOMY TUMOR EXCISION;  Surgeon: Erline Levine, MD;  Location: [place] NEURO ORS;  Service: Neurosurgery;  Laterality: Right;  Right Parasagittal craniotomy for meningioma with Stealth  . CRANIOTOMY Right 01/10/2019   Procedure: Right Parasagittal Craniotomy for Tumor;  Surgeon: Erline Levine, MD;  Location: [place];  Service: Neurosurgery;  Laterality: Right;  Right parasagittal craniotomy for tumor  . ESOPHAGOGASTRODUODENOSCOPY    . right knee arthroscopy      Vitals:   07/08/19 0717  BP: (!) 150/88    Subjective Assessment - 07/08/19 0715    Subjective   Pt reports that when he went to the dentist the other day he could not have procedure since his BP was too high. Went to ER and stayed a bit but wait was so long. He denies any headaches. Reports that his low back has been spasming some and he has been taking the muscle relaxer some.    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 History of right THA on 2019.    Patient Stated Goals  Pt would like to be able to elimate his cane use.    Currently in Pain?  No/denies    Pain Onset  More than a month ago                       [facility] Adult PT Treatment/Exercise - 07/08/19 0720      Ambulation/Gait   Ambulation/Gait  Yes    Ambulation/Gait Assistance  5: Supervision    Ambulation/Gait Assistance Details  Verbal cues for upright posture and increased left heel strike    Ambulation Distance (Feet)  230 Feet    Assistive device  None    Gait Pattern  Step-through pattern;Decreased hip/knee flexion - left;Decreased dorsiflexion - left    Ambulation Surface  Level;Indoor      Neuro Re-ed  Neuro Re-ed Details   In // bars: marching with occasional UE support 6' x 6, reciprocal steps over 4 foam beams 2"x4" x 6 bouts with occasional UE support. Stepping fowards and backwards over 4" height beam x 6 each leg with light UE support.  BP=150/94 after activities. Pt challenged with bringing left leg back over beam. Pt performed standing left hamstring curl x 10 then x 10 with 3# ankle weight. Left hip ext 10 x 2 with 3# ankle weight. Pt was given verbal cues for form for more upright posture. Pt reported some discomfort in right hip with history of THR on stance leg. Standing with right leg on soccerball for left SLS x 5 sec x 2. Pt with decreased stability and needing to touch often. Alternating toe taps on soccerball x 10 with fingertip support. Close  SBA/CGA for safety with activities.              PT Education - 07/08/19 0811    Education Details  Pt to continue with current HEP. Gave number to [employer] so patient can call to find out when AFO fitting was scheduled for.    Person(s) Educated  Patient    Methods  Explanation    Comprehension  Verbalized understanding       PT Short Term Goals - 06/27/19 1134      PT SHORT TERM GOAL #1   Title  Pt will be independent with HEP to continue strength and balance gains on own.    Baseline  Pt has been performing initial HEP    Time  4    Period  Weeks    Status  Achieved    Target Date  06/28/19      PT SHORT TERM GOAL #2   Title  Pt will ambulate >300' on level surfaces mod I for improved household and short community distances without AD    Baseline  345' without AD supervision    Time  4    Period  Weeks    Status  Partially Met    Target Date  06/28/19      PT SHORT TERM GOAL #3   Title  Pt will increase gait speed from 0.26ms to >0.51 m/s for improved gait safety.    Baseline  0.618m on 06/27/2019    Time  4    Period  Weeks    Status  Achieved    Target Date  06/28/19        PT Long Term Goals - 05/29/19 1944      PT LONG TERM GOAL #1   Title  Pt will be independent with progressive HEP for strength and balance to continue gains on own.    Baseline  currently does not have HEP    Time  8    Period  Weeks    Status  New    Target Date  07/28/19      PT LONG TERM GOAL #2   Title  Pt will increase gait speed to >0.7 m/s for improved gait safety in community.    Baseline  0.4156mon 05/29/2019    Time  8    Period  Weeks    Status  New    Target Date  07/28/19      PT LONG TERM GOAL #3   Title  Pt will increase Berg score form 44/56 to >48/56 for improved balance and decreased fall risk.    Baseline  44/56 on 05/29/2019  Time  8    Period  Weeks    Status  New    Target Date  07/28/19      PT LONG TERM GOAL #4   Title  Pt will ambulate >500'  on varied surfaces independently without AD for improved community mobility.    Baseline  29' with [facility] supervision.    Time  8    Period  Weeks    Status  New    Target Date  07/28/19            Plan - 07/08/19 4975    Clinical Impression Statement  PT monitored BP during session due to pt having issues at dentist last week. Slightly elevated but no significant change during session. Pt continues to have decreased left foot clearance with gait and challenged with SLS activities.    Examination-Activity Limitations  Locomotion Level;Stairs;Dressing    Examination-Participation Restrictions  Community Activity    Stability/Clinical Decision Making  Evolving/Moderate complexity    Rehab Potential  Good    PT Frequency  1x / week   plus eval, followed by 2x/week for 4 weeks   PT Duration  3 weeks   followed by 2x/week for 4 weeks   PT Treatment/Interventions  ADLs/Self Care Home Management;DME Instruction;Gait training;Stair training;Functional mobility training;Therapeutic activities;Patient/family education;Neuromuscular re-education;Balance training;Therapeutic exercise;Orthotic Fit/Training;Manual techniques;Passive range of motion    PT Next Visit Plan  Next visit gait training, balance training, strengthening focusing mostly on left LE. Check BP.    Consulted and Agree with Plan of Care  Patient       Patient will benefit from skilled therapeutic intervention in order to improve the following deficits and impairments:  Abnormal gait, Decreased activity tolerance, Decreased balance, Difficulty walking, Decreased strength, Impaired sensation, Postural dysfunction, Impaired flexibility, Decreased mobility, Decreased range of motion  Visit Diagnosis: Other abnormalities of gait and mobility  Muscle weakness (generalized)     Problem List Patient Active Problem List   Diagnosis Date Noted  . 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[place]) 03/07/2019  . Seizures ([place])  01/31/2019  . Meningioma, recurrent of brain ([place]) 01/31/2019  . Meningioma ([place]) 01/03/2019  . Brain tumor ([place]) 12/27/2018  . Diffuse idiopathic skeletal hyperostosis 06/05/2013    Electa Sniff, PT, DPT, NCS 07/08/2019, 8:14 AM  [facility] [address] [place], [place], [address] Phone: [phone]   Fax:  [phone]  Name: LIBERTY STEAD MRN: [id number] Date of Birth: [birth year]

## 2019-07-09 ENCOUNTER — Encounter (HOSPITAL_COMMUNITY): Payer: Self-pay | Admitting: Emergency Medicine

## 2019-07-09 ENCOUNTER — Emergency Department (HOSPITAL_COMMUNITY)
Admission: EM | Admit: 2019-07-09 | Discharge: 2019-07-09 | Disposition: A | Discharge status: Home / Self Care | Payer: Medicaid Other | Attending: Emergency Medicine | Admitting: Emergency Medicine

## 2019-07-09 ENCOUNTER — Other Ambulatory Visit: Payer: Self-pay

## 2019-07-09 ENCOUNTER — Inpatient Hospital Stay: Payer: Medicaid Other | Admitting: Internal Medicine

## 2019-07-09 DIAGNOSIS — Z7982 Long term (current) use of aspirin: Secondary | ICD-10-CM | POA: Insufficient documentation

## 2019-07-09 DIAGNOSIS — G40909 Epilepsy, unspecified, not intractable, without status epilepticus: Secondary | ICD-10-CM

## 2019-07-09 DIAGNOSIS — R569 Unspecified convulsions: Secondary | ICD-10-CM | POA: Diagnosis present

## 2019-07-09 DIAGNOSIS — F17210 Nicotine dependence, cigarettes, uncomplicated: Secondary | ICD-10-CM | POA: Diagnosis not present

## 2019-07-09 DIAGNOSIS — C709 Malignant neoplasm of meninges, unspecified: Secondary | ICD-10-CM | POA: Diagnosis not present

## 2019-07-09 DIAGNOSIS — Z79899 Other long term (current) drug therapy: Secondary | ICD-10-CM | POA: Insufficient documentation

## 2019-07-09 HISTORY — DX: Unspecified convulsions: R56.9

## 2019-07-09 LAB — CBC WITH DIFFERENTIAL/PLATELET
Abs Immature Granulocytes: 0.02 10*3/uL (ref 0.00–0.07)
Basophils Absolute: 0.1 10*3/uL (ref 0.0–0.1)
Basophils Relative: 1 %
Eosinophils Absolute: 0.1 10*3/uL (ref 0.0–0.5)
Eosinophils Relative: 2 %
HCT: 44.6 % (ref 39.0–52.0)
Hemoglobin: 15.2 g/dL (ref 13.0–17.0)
Immature Granulocytes: 0 %
Lymphocytes Relative: 29 %
Lymphs Abs: 1.6 10*3/uL (ref 0.7–4.0)
MCH: 30.8 pg (ref 26.0–34.0)
MCHC: 34.1 g/dL (ref 30.0–36.0)
MCV: 90.5 fL (ref 80.0–100.0)
Monocytes Absolute: 0.6 10*3/uL (ref 0.1–1.0)
Monocytes Relative: 11 %
Neutro Abs: 3.2 10*3/uL (ref 1.7–7.7)
Neutrophils Relative %: 57 %
Platelets: 227 10*3/uL (ref 150–400)
RBC: 4.93 MIL/uL (ref 4.22–5.81)
RDW: 12.3 % (ref 11.5–15.5)
WBC: 5.6 10*3/uL (ref 4.0–10.5)
nRBC: 0 % (ref 0.0–0.2)

## 2019-07-09 LAB — MAGNESIUM: Magnesium: 1.8 mg/dL (ref 1.7–2.4)

## 2019-07-09 LAB — COMPREHENSIVE METABOLIC PANEL
ALT: 25 U/L (ref 0–44)
AST: 20 U/L (ref 15–41)
Albumin: 3.6 g/dL (ref 3.5–5.0)
Alkaline Phosphatase: 68 U/L (ref 38–126)
Anion gap: 10 (ref 5–15)
BUN: 10 mg/dL (ref 6–20)
CO2: 23 mmol/L (ref 22–32)
Calcium: 9.1 mg/dL (ref 8.9–10.3)
Chloride: 106 mmol/L (ref 98–111)
Creatinine, Ser: 0.88 mg/dL (ref 0.61–1.24)
GFR calc Af Amer: >60 mL/min (ref >60)
GFR calc non Af Amer: >60 mL/min (ref >60)
Glucose, Bld: 123 mg/dL — ABNORMAL HIGH (ref 70–99)
Potassium: 4 mmol/L (ref 3.5–5.1)
Sodium: 139 mmol/L (ref 135–145)
Total Bilirubin: 0.3 mg/dL (ref 0.3–1.2)
Total Protein: 6.2 g/dL — ABNORMAL LOW (ref 6.5–8.1)

## 2019-07-09 LAB — URINALYSIS, ROUTINE W REFLEX MICROSCOPIC
Bilirubin Urine: NEGATIVE
Glucose, UA: NEGATIVE mg/dL
Hgb urine dipstick: NEGATIVE
Ketones, ur: NEGATIVE mg/dL
Leukocytes,Ua: NEGATIVE
Nitrite: NEGATIVE
Protein, ur: NEGATIVE mg/dL
Specific Gravity, Urine: 1.024 (ref 1.005–1.030)
pH: 5 (ref 5.0–8.0)

## 2019-07-09 LAB — PHENYTOIN LEVEL, TOTAL: Phenytoin Lvl: 4.7 ug/mL — ABNORMAL LOW (ref 10.0–20.0)

## 2019-07-09 MED ORDER — SODIUM CHLORIDE 0.9 % IV SOLN
7.5000 mg/kg | Freq: Once | INTRAVENOUS | Status: AC
  Administered 2019-07-09: 629.5 mg via INTRAVENOUS
  Filled 2019-07-09: qty 12.59

## 2019-07-09 NOTE — ED Notes (Signed)
Pt discharge instructions and follow-up instructions reviewed with the patient. The patient verbalized understanding of both. Pt discharged. 

## 2019-07-09 NOTE — Discharge Instructions (Addendum)
Continue your current medications as prescribed by your Neurologist.

## 2019-07-09 NOTE — ED Provider Notes (Signed)
[facility] EMERGENCY DEPARTMENT Provider Note   CSN: [id number] Arrival date & time: 07/09/19  [id number]     History Chief Complaint  Patient presents with  . Seizures    Joshua [name] is a 58 y.o. male.  The history is provided by the patient and medical records. No language interpreter was used.  Seizures  Joshua [name] is a 58 y.o. male who presents to the Emergency Department complaining of seizures. He has a history of partial seizures as well as history of meningioma status post resection times two. Several weeks ago he had his medications adjusted with tapering of his prednisone and increasing of his Dilantin. Overall he is been feeling well. Today around 7 AM he developed a typical seizure for him with numbness in his left leg followed by twitching and jerking of his left leg, hemi abdomen and left upper extremity. Usually the symptoms resolved with one lorazepam. He required taking three lorazepam prior to discontinuation of the seizure activity. The overall seizure lasted about 30 minutes. Currently he reports feeling in his baseline state of health. He denies any fevers, nausea, vomiting, Donald pain, chest pain, shortness of breath. No known COVID19 exposures. He usually lives with his wife but he is currently staying with his sister while his wife is out of town.    Past Medical History:  Diagnosis Date  . Brain tumor ([place]) 02/20/2013   brain tumor removed in March 2014, Dr Donald Pore  . Dizziness   . Enlarged prostate   . Headache(784.0)    scattered  . Meningioma ([place])   . Seizures [facility])     Patient Active Problem List   Diagnosis Date Noted  . 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[place]) 03/07/2019  . Seizures ([place]) 01/31/2019  . Meningioma, recurrent of brain ([place]) 01/31/2019  . Meningioma ([place]) 01/03/2019  . Brain tumor ([place]) 12/27/2018  . Diffuse idiopathic skeletal hyperostosis 06/05/2013    Past Surgical History:  Procedure  Laterality Date  . APPLICATION OF CRANIAL NAVIGATION N/A 01/10/2019   Procedure: APPLICATION OF CRANIAL NAVIGATION;  Surgeon: Erline Levine, MD;  Location: [place];  Service: Neurosurgery;  Laterality: N/A;  . CATARACT EXTRACTION Right   . CRANIOTOMY Right 11/06/2012   Procedure: CRANIOTOMY TUMOR EXCISION;  Surgeon: Erline Levine, MD;  Location: [place] NEUR[facility]S;  Service: Neurosurgery;  Laterality: Right;  Right Parasagittal craniotomy for meningioma with Stealth  . CRANIOTOMY Right 01/10/2019   Procedure: Right Parasagittal Craniotomy for Tumor;  Surgeon: Erline Levine, MD;  Location: [place];  Service: Neurosurgery;  Laterality: Right;  Right parasagittal craniotomy for tumor  . ESOPHAGOGASTRODUODENOSCOPY    . right knee arthroscopy         Family History  Problem Relation Age of Onset  . Hypertension Mother   . Hypertension Father     Social History   Tobacco Use  . Smoking status: Current Some Day Smoker    Types: Cigarettes  . Smokeless tobacco: Never Used  . Tobacco comment: hasn't smoked in a week  Substance Use Topics  . Alcohol use: Yes    Alcohol/week: 8.0 standard drinks    Types: 6 Cans of beer, 2 Shots of liquor per week    Comment: occasionally  . Drug use: No    Home Medications Prior to Admission medications   Medication Sig Start Date End Date Taking? Authorizing Provider  aspirin EC 81 MG EC tablet Take 1 tablet (81 mg total) by mouth daily. 01/14/19  Erline Levine, MD  clonazePAM (KLONOPIN) 0.5 MG tablet Take 1 tablet (0.5 mg total) by mouth 2 (two) times daily. Take 1 tablet twice per day for the next 7 days. 05/27/19   Vaslow, Acey Lav, MD  cyclobenzaprine (FLEXERIL) 10 MG tablet Take 1 tablet (10 mg total) by mouth 2 (two) times daily as needed for muscle spasms. 06/03/19   Charlott Rakes, MD  FIBER ADULT GUMMIES PO Take 1 tablet by mouth daily.    [provider]  lamoTRIgine (LAMICTAL) 100 MG tablet TAKE 1 TABLET(100 MG) BY MOUTH TWICE DAILY 06/25/19    Vaslow, Acey Lav, MD  levETIRAcetam (KEPPRA) 500 MG tablet TAKE 3 TABLETS(1500 MG) BY MOUTH TWICE DAILY 06/25/19   Vaslow, Acey Lav, MD  LORazepam (ATIVAN) 1 MG tablet Take 1 tablet (1 mg total) by mouth every 8 (eight) hours as needed for seizure (for breakthrough seizures). 04/15/19   [name], MD  Melatonin 5 MG CHEW Chew 3 tablets by mouth at bedtime as needed (sleep).    [provider]  naproxen sodium (ALEVE) 220 MG tablet Take 440 mg by mouth as needed (pain or headache).    [provider]  phenytoin (DILANTIN) 300 MG ER capsule Take 1 capsule (300 mg total) by mouth at bedtime. 06/25/19   [name], MD  prochlorperazine (COMPAZINE) 10 MG tablet Take 1 tablet (10 mg total) by mouth every 6 (six) hours as needed for nausea or vomiting. 03/08/19   Kyung Rudd, MD  sucralfate (CARAFATE) 1 g tablet Take 1 tablet (1 g total) by mouth 4 (four) times daily -  with meals and at bedtime for 14 days. 05/20/19 06/03/19  Curatolo, Adam, DO  triamcinolone cream (KENALOG) 0.1 % Apply 1 application topically 2 (two) times daily. 02/25/19   Charlott Rakes, MD  vitamin C (ASCORBIC ACID) 500 MG tablet Take 500 mg by mouth daily.    [provider]    Allergies    Patient has no known allergies.  Review of Systems   Review of Systems  Neurological: Positive for seizures.  All other systems reviewed and are negative.   Physical Exam Updated Vital Signs BP 110/90   Pulse 85   Temp 97.9 F (36.6 C) (Oral)   Resp 16   Wt 83.9 kg   SpO2 95%   BMI 27.32 kg/m   Physical Exam Vitals and nursing note reviewed.  Constitutional:      Appearance: He is well-developed.  HENT:     Head: Normocephalic and atraumatic.  Cardiovascular:     Rate and Rhythm: Normal rate and regular rhythm.     Heart sounds: No murmur.  Pulmonary:     Effort: Pulmonary effort is normal. No respiratory distress.     Breath sounds: Normal breath sounds.  Abdominal:      Palpations: Abdomen is soft.     Tenderness: There is no abdominal tenderness. There is no guarding or rebound.  Musculoskeletal:        General: No tenderness.  Skin:    General: Skin is warm and dry.  Neurological:     Mental Status: He is alert and oriented to person, place, and time.     Comments: Five out of five strength and bilateral upper extremities. No asymmetry of facial movements. There is 4/5 strength and bilateral lower extremities, slightly weaker in the left when compared to right. Sensation to light touch intact in all four extremities.  Psychiatric:  Behavior: Behavior normal.     ED Results / Procedures / Treatments   Labs (all labs ordered are listed, but only abnormal results are displayed) Labs Reviewed  COMPREHENSIVE METABOLIC PANEL - Abnormal; Notable for the following components:      Result Value   Glucose, Bld 123 (*)    Total Protein 6.2 (*)    All other components within normal limits  PHENYTOIN LEVEL, TOTAL - Abnormal; Notable for the following components:   Phenytoin Lvl 4.7 (*)    All other components within normal limits  CBC WITH DIFFERENTIAL/PLATELET  MAGNESIUM  URINALYSIS, ROUTINE W REFLEX MICROSCOPIC    EKG None  Radiology No results found.  Procedures Procedures (including critical care time)  Medications Ordered in ED Medications  fosPHENYtoin (CEREBYX) 629.5 mg PE in sodium chloride 0.9 % 50 mL IVPB (0 mg PE/kg  83.9 kg Intravenous Stopped 07/09/19 1310)    ED Course  I have reviewed the triage vital signs and the nursing notes.  Pertinent labs & imaging results that were available during my care of the patient were reviewed by me and considered in my medical decision making (see chart for details).    MDM Rules/Calculators/A&P                      Patient with history of seizure disorder here for evaluation of recurrent seizure. He is asymptomatic on ED evaluation. He does have lower extremity weakness, which he  states is at his baseline. His Dilantin level is low. Initially he stated that he was taking as directed but on repeat evaluation he states that he has been taking two tablets instead of three tablets due to running out of it today. He does have a refill available. Will provide Dilantin load with plan for neurology follow-up. Discussed with patient seizure precautions, outpatient follow-up and return precautions. Final Clinical Impression(s) / ED Diagnoses Final diagnoses:  Seizure disorder [facility])    Rx / DC Orders ED Discharge Orders    None       Quintella Reichert, MD 07/09/19 1525

## 2019-07-09 NOTE — ED Triage Notes (Signed)
Pt reports seizures with L sided shaking only since having brain tumor removed in June.  States he normally takes Lorazepam 1mg  and seizures stop but this morning he had to take 3 pills.

## 2019-07-10 ENCOUNTER — Encounter: Payer: Self-pay | Admitting: Physical Therapy

## 2019-07-10 ENCOUNTER — Other Ambulatory Visit: Payer: Self-pay

## 2019-07-10 ENCOUNTER — Ambulatory Visit: Payer: Medicaid Other | Admitting: Physical Therapy

## 2019-07-10 VITALS — BP 127/90

## 2019-07-10 DIAGNOSIS — M6281 Muscle weakness (generalized): Secondary | ICD-10-CM | POA: Diagnosis not present

## 2019-07-10 DIAGNOSIS — R2689 Other abnormalities of gait and mobility: Secondary | ICD-10-CM | POA: Diagnosis not present

## 2019-07-10 NOTE — Therapy (Signed)
[facility] [address] [place], [place], [address] Phone: [phone]   Fax:  [phone]  Physical Therapy Treatment  Patient Details  Name: TORRY ISTRE MRN: [id number] Date of Birth: Feb 15, 1961 Referring Provider (PT): Cecil Cobbs   Encounter Date: 07/10/2019  PT End of Session - 07/10/19 0728    Visit Number  7    Number of Visits  12    Date for PT Re-Evaluation  07/28/19    Authorization Type  Medicaid 3 visits 06/03/11/02/2019, 12/10-12/30/20 6 units    Authorization - Visit Number  6    Authorization - Number of Visits  8    PT Start Time  0725   pt running late today   PT Stop Time  0800    PT Time Calculation (min)  35 min    Equipment Utilized During Treatment  Gait belt    Activity Tolerance  Patient tolerated treatment well    Behavior During Therapy  [facility] for tasks assessed/performed       Past Medical History:  Diagnosis Date  . Brain tumor ([place]) 02/20/2013   brain tumor removed in March 2014, Dr Donald Pore  . Dizziness   . Enlarged prostate   . Headache(784.0)    scattered  . Meningioma ([place])   . Seizures ([place])     Past Surgical History:  Procedure Laterality Date  . APPLICATION OF CRANIAL NAVIGATION N/A 01/10/2019   Procedure: APPLICATION OF CRANIAL NAVIGATION;  Surgeon: Erline Levine, MD;  Location: [facility];  Service: Neurosurgery;  Laterality: N/A;  . CATARACT EXTRACTION Right   . CRANIOTOMY Right 11/06/2012   Procedure: CRANIOTOMY TUMOR EXCISION;  Surgeon: Erline Levine, MD;  Location: [place] NEUR[facility]S;  Service: Neurosurgery;  Laterality: Right;  Right Parasagittal craniotomy for meningioma with Stealth  . CRANIOTOMY Right 01/10/2019   Procedure: Right Parasagittal Craniotomy for Tumor;  Surgeon: Erline Levine, MD;  Location: [facility];  Service: Neurosurgery;  Laterality: Right;  Right parasagittal craniotomy for tumor  . ESOPHAGOGASTRODUODENOSCOPY    . right knee arthroscopy      Vitals:    07/10/19 0726 07/10/19 0751  BP: 128/89 127/90    Subjective Assessment - 07/10/19 0726    Subjective  Went to ED yesterday due to prolonged seizure activity. Reports his Dialantin levels were low. See's neurologist via tele health on Monday.    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 History of right THA on 2019.    Patient Stated Goals  Pt would like to be able to elimate his cane use.    Currently in Pain?  Yes    Pain Score  7     Pain Location  Generalized    Pain Descriptors / Indicators  Aching;Sore    Pain Type  Chronic pain    Pain Onset  More than a month ago    Pain Frequency  Intermittent    Aggravating Factors   increased activity    Pain Relieving Factors  rest            [facility] Adult PT Treatment/Exercise - 07/10/19 0732      Transfers   Transfers  Sit to Stand;Stand to Sit    Sit to Stand  6: Modified independent (Device/Increase time)    Stand to Sit  6: Modified independent (Device/Increase time)      Ambulation/Gait   Ambulation/Gait  Yes  Ambulation/Gait Assistance  4: Min guard    Ambulation/Gait Assistance Details  cues for upright posture, increased bil step length and for heel strike on left LE.     Ambulation Distance (Feet)  230 Feet   x1   Assistive device  None    Gait Pattern  Step-through pattern;Decreased hip/knee flexion - left;Decreased dorsiflexion - left    Ambulation Surface  Level;Indoor      High Level Balance   High Level Balance Activities  Side stepping;Tandem walking   tandem walking fwd/bwd   High Level Balance Comments  at counter top with no to single UE support: cues on ex form/technique with min guard to min assist for balance for 2 laps each/each way.       Neuro Re-ed    Neuro Re-ed Details   for strengthening/muscle re-ed: with right foot on foam bubble- sit<>stands x 10 reps  with min assist, cues on full upright standing and slow, controlled descent       Exercises   Exercises  Other Exercises    Other Exercises   bil LE bridge x 10 reps with 5 sec holds; then left leg single leg bridge for 2 sets of 10 reps with assist to stabilize left LE.                PT Short Term Goals - 06/27/19 1134      PT SHORT TERM GOAL #1   Title  Pt will be independent with HEP to continue strength and balance gains on own.    Baseline  Pt has been performing initial HEP    Time  4    Period  Weeks    Status  Achieved    Target Date  06/28/19      PT SHORT TERM GOAL #2   Title  Pt will ambulate >300' on level surfaces mod I for improved household and short community distances without AD    Baseline  345' without AD supervision    Time  4    Period  Weeks    Status  Partially Met    Target Date  06/28/19      PT SHORT TERM GOAL #3   Title  Pt will increase gait speed from 0.5ms to >0.51 m/s for improved gait safety.    Baseline  0.666m on 06/27/2019    Time  4    Period  Weeks    Status  Achieved    Target Date  06/28/19        PT Long Term Goals - 05/29/19 1944      PT LONG TERM GOAL #1   Title  Pt will be independent with progressive HEP for strength and balance to continue gains on own.    Baseline  currently does not have HEP    Time  8    Period  Weeks    Status  New    Target Date  07/28/19      PT LONG TERM GOAL #2   Title  Pt will increase gait speed to >0.7 m/s for improved gait safety in community.    Baseline  0.4132mon 05/29/2019    Time  8    Period  Weeks    Status  New    Target Date  07/28/19      PT LONG TERM GOAL #3   Title  Pt will increase Berg score form 44/56 to >48/56 for improved balance and decreased fall risk.  Baseline  44/56 on 05/29/2019    Time  8    Period  Weeks    Status  New    Target Date  07/28/19      PT LONG TERM GOAL #4   Title  Pt will ambulate >500' on varied surfaces independently without  AD for improved community mobility.    Baseline  35' with [facility] supervision.    Time  8    Period  Weeks    Status  New    Target Date  07/28/19            Plan - 07/10/19 0729    Clinical Impression Statement  Today's skilled session continued to focus on LE strengthening, gait with no device and balance reactions with rest breaks needed due to fatigue. No other issues reported with session. BP remained in acceptable ranges with activity. The pt is making steady progress toward goals and should benefit from continued PT to progress toward unmet goals.    Examination-Activity Limitations  Locomotion Level;Stairs;Dressing    Examination-Participation Restrictions  Community Activity    Stability/Clinical Decision Making  Evolving/Moderate complexity    Rehab Potential  Good    PT Frequency  1x / week   plus eval, followed by 2x/week for 4 weeks   PT Duration  3 weeks   followed by 2x/week for 4 weeks   PT Treatment/Interventions  ADLs/Self Care Home Management;DME Instruction;Gait training;Stair training;Functional mobility training;Therapeutic activities;Patient/family education;Neuromuscular re-education;Balance training;Therapeutic exercise;Orthotic Fit/Training;Manual techniques;Passive range of motion    PT Next Visit Plan  gait training, balance training, strengthening focusing mostly on left LE. Check BP.    Consulted and Agree with Plan of Care  Patient       Patient will benefit from skilled therapeutic intervention in order to improve the following deficits and impairments:  Abnormal gait, Decreased activity tolerance, Decreased balance, Difficulty walking, Decreased strength, Impaired sensation, Postural dysfunction, Impaired flexibility, Decreased mobility, Decreased range of motion  Visit Diagnosis: Other abnormalities of gait and mobility  Muscle weakness (generalized)     Problem List Patient Active Problem List   Diagnosis Date Noted  . Malignant meningioma of  meninges of brain ([place]) 03/07/2019  . Seizures ([place]) 01/31/2019  . Meningioma, recurrent of brain ([place]) 01/31/2019  . Meningioma ([place]) 01/03/2019  . Brain tumor ([place]) 12/27/2018  . Diffuse idiopathic skeletal hyperostosis 06/05/2013    Willow Ora, PTA, [facility] [address] [place], [place] [address] [phone] 07/10/19, 1:44 PM  Name: OWAIS PRUETT MRN: [id number] Date of Birth: 06/05/1961

## 2019-07-15 ENCOUNTER — Other Ambulatory Visit: Payer: Self-pay

## 2019-07-15 ENCOUNTER — Ambulatory Visit: Payer: Medicaid Other

## 2019-07-15 ENCOUNTER — Inpatient Hospital Stay (HOSPITAL_BASED_OUTPATIENT_CLINIC_OR_DEPARTMENT_OTHER): Payer: Medicaid Other | Admitting: Internal Medicine

## 2019-07-15 DIAGNOSIS — R2689 Other abnormalities of gait and mobility: Secondary | ICD-10-CM

## 2019-07-15 DIAGNOSIS — R569 Unspecified convulsions: Secondary | ICD-10-CM

## 2019-07-15 DIAGNOSIS — D320 Benign neoplasm of cerebral meninges: Secondary | ICD-10-CM

## 2019-07-15 DIAGNOSIS — M6281 Muscle weakness (generalized): Secondary | ICD-10-CM

## 2019-07-15 NOTE — Therapy (Signed)
[facility] [address] [place], [place], [address] Phone: [phone]   Fax:  [phone]  Physical Therapy Treatment/Recert  Patient Details  Name: Joshua [name] MRN: [id number] Date of Birth: 15-Aug-1960 Referring Provider (PT): Cecil Cobbs   Encounter Date: 07/15/2019  PT End of Session - 07/15/19 0718    Visit Number  8    Number of Visits  12    Date for PT Re-Evaluation  10/13/19    Authorization Type  Medicaid 3 visits 06/03/11/02/2019, 12/10-12/30/20 6 units    Authorization - Visit Number  7    Authorization - Number of Visits  8    PT Start Time  0715    PT Stop Time  0756    PT Time Calculation (min)  41 min    Equipment Utilized During Treatment  --    Activity Tolerance  Patient tolerated treatment well    Behavior During Therapy  [facility] for tasks assessed/performed       Past Medical History:  Diagnosis Date  . Brain tumor ([place]) 02/20/2013   brain tumor removed in March 2014, Dr Donald Pore  . Dizziness   . Enlarged prostate   . Headache(784.0)    scattered  . Meningioma ([place])   . Seizures ([place])     Past Surgical History:  Procedure Laterality Date  . APPLICATION OF CRANIAL NAVIGATION N/A 01/10/2019   Procedure: APPLICATION OF CRANIAL NAVIGATION;  Surgeon: Erline Levine, MD;  Location: [facility];  Service: Neurosurgery;  Laterality: N/A;  . CATARACT EXTRACTION Right   . CRANIOTOMY Right 11/06/2012   Procedure: CRANIOTOMY TUMOR EXCISION;  Surgeon: Erline Levine, MD;  Location: [facility];  Service: Neurosurgery;  Laterality: Right;  Right Parasagittal craniotomy for meningioma with Stealth  . CRANIOTOMY Right 01/10/2019   Procedure: Right Parasagittal Craniotomy for Tumor;  Surgeon: Erline Levine, MD;  Location: [facility];  Service: Neurosurgery;  Laterality: Right;  Right parasagittal craniotomy for tumor  . ESOPHAGOGASTRODUODENOSCOPY    . right knee arthroscopy      There were no vitals filed for  this visit.  Subjective Assessment - 07/15/19 0718    Subjective  Pt reports he is doing well. Was able to get the new prescription of Dilantin and has been doing well.    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 History of right THA on 2019.    Patient Stated Goals  Pt would like to be able to elimate his cane use.    Currently in Pain?  No/denies    Pain Onset  More than a month ago         [facility] PT Assessment - 07/15/19 0724      Functional Gait  Assessment   Gait assessed   Yes    Gait Level Surface  Walks 20 ft, slow speed, abnormal gait pattern, evidence for imbalance or deviates 10-15 in outside of the 12 in walkway width. Requires more than 7 sec to ambulate 20 ft.    Change in Gait Speed  Able to change speed, demonstrates mild gait deviations, deviates 6-10 in outside of the 12 in walkway width, or no gait deviations, unable to achieve a major change in velocity, or uses a change in velocity, or uses an assistive device.    Gait with Horizontal Head Turns  Performs head turns with moderate changes in gait velocity, slows down, deviates 10-15  in outside 12 in walkway width but recovers, can continue to walk.    Gait with Vertical Head Turns  Performs task with slight change in gait velocity (eg, minor disruption to smooth gait path), deviates 6 - 10 in outside 12 in walkway width or uses assistive device    Gait and Pivot Turn  Pivot turns safely within 3 sec and stops quickly with no loss of balance.    Step Over Obstacle  Is able to step over one shoe box (4.5 in total height) but must slow down and adjust steps to clear box safely. May require verbal cueing.    Gait with Narrow Base of Support  Ambulates less than 4 steps heel to toe or cannot perform without assistance.    Gait with Eyes Closed  Cannot walk 20 ft without assistance, severe  gait deviations or imbalance, deviates greater than 15 in outside 12 in walkway width or will not attempt task.    Ambulating Backwards  Walks 20 ft, uses assistive device, slower speed, mild gait deviations, deviates 6-10 in outside 12 in walkway width.    Steps  Alternating feet, must use rail.    Total Score  14                   [facility] Adult PT Treatment/Exercise - 07/15/19 0724      Ambulation/Gait   Ambulation/Gait  Yes    Ambulation/Gait Assistance  6: Modified independent (Device/Increase time);5: Supervision    Ambulation Distance (Feet)  350 Feet    Assistive device  None    Gait Pattern  Step-through pattern;Decreased step length - left;Decreased hip/knee flexion - left;Decreased dorsiflexion - left    Ambulation Surface  Level;Indoor    Gait velocity  0.57ms without AD    Stairs  Yes    Stairs Assistance  6: Modified independent (Device/Increase time)    Stair Management Technique  Two rails;Alternating pattern    Number of Stairs  4      Standardized Balance Assessment   Standardized Balance Assessment  Berg Balance Test      Berg Balance Test   Sit to Stand  Able to stand without using hands and stabilize independently    Standing Unsupported  Able to stand safely 2 minutes    Sitting with Back Unsupported but Feet Supported on Floor or Stool  Able to sit safely and securely 2 minutes    Stand to Sit  Sits safely with minimal use of hands    Transfers  Able to transfer safely, minor use of hands    Standing Unsupported with Eyes Closed  Able to stand 10 seconds safely    Standing Ubsupported with Feet Together  Able to place feet together independently and stand 1 minute safely    From Standing, Reach Forward with Outstretched Arm  Can reach confidently >25 cm (10")    From Standing Position, Pick up Object from Floor  Able to pick up shoe safely and easily    From Standing Position, Turn to Look Behind Over each Shoulder  Looks behind one side only/other  side shows less weight shift    Turn 360 Degrees  Able to turn 360 degrees safely but slowly    Standing Unsupported, Alternately Place Feet on Step/Stool  Able to stand independently and complete 8 steps >20 seconds    Standing Unsupported, One Foot in Front  Able to plae foot ahead of the other independently and hold 30 seconds  Standing on One Leg  Tries to lift leg/unable to hold 3 seconds but remains standing independently    Total Score  48      Neuro Re-ed    Neuro Re-ed Details   In // bars: standing on rockerboard positioned ant/post maintaining level x 30 sec eyes open  then eyes closed but increased sway with eyes closed and had to touch a couple times CGA for safety. Rocking board ant/posterior without UE support x 10. Walking over mat on floor marching 6' x 6 without UE support with verbal cues to tighten core to help for more upright posture, side stepping over mat without UE support 6' x 8.             PT Education - 07/15/19 9242    Education Details  PT discussed PT plan of care. Pt to continue with current HEP    Person(s) Educated  Patient    Methods  Explanation    Comprehension  Verbalized understanding       PT Short Term Goals - 07/15/19 0818      PT SHORT TERM GOAL #1   Title  Pt will be independent with HEP to continue strength and balance gains on own.    Baseline  Pt has been performing initial HEP    Time  4    Period  Weeks    Status  Achieved    Target Date  06/28/19      PT SHORT TERM GOAL #2   Title  Pt will ambulate >300' on level surfaces mod I for improved household and short community distances without AD    Baseline  Pt has met on level surfaces    Time  4    Period  Weeks    Status  Achieved    Target Date  06/28/19      PT SHORT TERM GOAL #3   Title  Pt will increase gait speed from 0.58ms to >0.51 m/s for improved gait safety.    Baseline  0.641m on 06/27/2019    Time  4    Period  Weeks    Status  Achieved    Target Date   06/28/19        PT Long Term Goals - 07/15/19 0733      PT LONG TERM GOAL #1   Title  Pt will be independent with progressive HEP for strength and balance to continue gains on own.    Baseline  Pt has been working on current HEP    Time  8    Period  Weeks    Status  Partially Met      PT LONG TERM GOAL #2   Title  Pt will increase gait speed to >0.7 m/s for improved gait safety in community.    Baseline  0.7437mon 07/15/2019    Time  8    Period  Weeks    Status  Achieved      PT LONG TERM GOAL #3   Title  Pt will increase Berg score form 44/56 to >48/56 for improved balance and decreased fall risk.    Baseline  48/56 on 07/15/2019    Time  8    Period  Weeks    Status  On-going      PT LONG TERM GOAL #4   Title  Pt will ambulate >500' on varied surfaces independently without AD for improved community mobility.    Baseline  345 mod I with  SPC and supervision with no AD on level surfaces    Time  8    Period  Weeks    Status  On-going       Updated goals: PT Short Term Goals - 07/15/19 0827      PT SHORT TERM GOAL #1   Title  Pt will start aquatic PT and be instructed in HEP.    Baseline  currently not doing aquatic PT    Time  4    Period  Weeks    Status  New    Target Date  08/19/19      PT Long Term Goals - 07/15/19 8182      PT LONG TERM GOAL #1   Title  Pt will be independent with progressive HEP for strength and balance to continue gains on own.    Baseline  Pt has been working on current HEP but PT will be adding to it.    Time  8    Period  Weeks    Status  On-going    Target Date  09/16/19      PT LONG TERM GOAL #2   Title  Pt will increase gait speed to >0.84 m/s for improved gait safety in community.    Baseline  0.59ms on 07/15/2019    Time  8    Period  Weeks    Status  Revised    Target Date  09/16/19      PT LONG TERM GOAL #3   Title  Pt will increase Berg score form 44/56 to >50/56 for improved balance and decreased fall risk.     Baseline  48/56 on 07/15/2019    Period  Weeks    Status  Revised    Target Date  09/16/19      PT LONG TERM GOAL #4   Title  Pt will ambulate >500' on varied surfaces independently without AD for improved community mobility.    Baseline  345 mod I with SPC and supervision with no AD on level surfaces    Time  8    Period  Weeks    Status  On-going    Target Date  09/16/19      PT LONG TERM GOAL #5   Title  Pt will increase FGA from 14 to >19 for improved balance and decreased fall risk.    Baseline  FGA=14/30 on 07/15/2019    Time  8    Period  Weeks    Status  New    Target Date  09/16/19           Plan - 07/15/19 0756    Clinical Impression Statement  Pt continues to show progress with therapy. Pt met gait speed goal today showing improving community mobility but still short of normal/safe community ambulator. Pt increased Berg score placing at lower fall risk to 48/56. FGA score, however, does indicate fall risk with 14/30. Pt has difficulty with more dynamic tasks. Pt continues to have decreased left foot clearance. Is scheduled to be fit for AFO with orthotist. Pt will benefit from continued PT to continue to address strength, balance and mobility.    Examination-Activity Limitations  Locomotion Level;Stairs;Dressing    Examination-Participation Restrictions  Community Activity    Stability/Clinical Decision Making  Evolving/Moderate complexity    Rehab Potential  Good    PT Frequency  2x      PT Duration  8 weeks      PT Treatment/Interventions  ADLs/Self  Care Home Management;DME Instruction;Gait training;Stair training;Functional mobility training;Therapeutic activities;Patient/family education;Neuromuscular re-education;Balance training;Therapeutic exercise;Orthotic Fit/Training;Manual techniques;Passive range of motion;Aquatic Therapy    PT Next Visit Plan  Continue with gait on varied surfaces, standing and dynamic balance activities, left LE strengthening, start  aquatics    Consulted and Agree with Plan of Care  Patient       Patient will benefit from skilled therapeutic intervention in order to improve the following deficits and impairments:  Abnormal gait, Decreased activity tolerance, Decreased balance, Difficulty walking, Decreased strength, Impaired sensation, Postural dysfunction, Impaired flexibility, Decreased mobility, Decreased range of motion  Visit Diagnosis: Other abnormalities of gait and mobility  Muscle weakness (generalized)     Problem List Patient Active Problem List   Diagnosis Date Noted  . 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[place]) 03/07/2019  . Seizures ([place]) 01/31/2019  . Meningioma, recurrent of brain ([place]) 01/31/2019  . Meningioma ([place]) 01/03/2019  . Brain tumor ([place]) 12/27/2018  . Diffuse idiopathic skeletal hyperostosis 06/05/2013    Electa Sniff, PT, DPT, NCS 07/15/2019, 8:26 AM  [facility] [address] [place], [place], [address] Phone: [phone]   Fax:  [phone]  Name: KAEVON COTTA MRN: [id number] Date of Birth: [birth year]

## 2019-07-15 NOTE — Progress Notes (Signed)
I connected with Joshua [name] on 07/15/19 at 10:00 AM EST by telephone visit and verified that I am speaking with the correct person using two identifiers.  I discussed the limitations, risks, security and privacy concerns of performing an evaluation and management service by telemedicine and the availability of in-person appointments. I also discussed with the patient that there may be a patient responsible charge related to this service. The patient expressed understanding and agreed to proceed.  Other persons participating in the visit and their role in the encounter:  n/a  Patient's location:  Home  Provider's location:  Office  Chief Complaint:  Seizures  History of Present Ilness: Joshua [name] did experience a breakthrough seizure, with typical semiology, on 12/22 leading to an ED visit.  He does acknowledge running out of dilantin prior to the episode.  No illness/fever, sleep issues, stressors.  At present he is at his baseline. Observations: Language and cognition normal Assessment and Plan: Breakthrough seizure secondary to interval of AED lapse (PHT).  No other provocation; patient has since been stable without seizure recurrence. Follow Up Instructions: Con't current regimen, follow up on 2/15 after next MRI brain or sooner as needed.  I discussed the assessment and treatment plan with the patient.  The patient was provided an opportunity to ask questions and all were answered.  The patient agreed with the plan and demonstrated understanding of the instructions.    The patient was advised to call back or seek an in-person evaluation if the symptoms worsen or if the condition fails to improve as anticipated.  I provided 5-10 minutes of non-face-to-face time during this enocunter.  [name], MD   I provided 12 minutes of non face-to-face telephone visit time during this encounter, and > 50% was spent counseling as documented under my assessment & plan.

## 2019-07-16 ENCOUNTER — Telehealth: Payer: Self-pay | Admitting: Internal Medicine

## 2019-07-16 NOTE — Telephone Encounter (Signed)
Scheduled appt per 12/28 los.  Left a voice message of the appt date and time.

## 2019-07-18 ENCOUNTER — Ambulatory Visit: Payer: Medicaid Other

## 2019-07-23 ENCOUNTER — Other Ambulatory Visit: Payer: Self-pay

## 2019-07-23 ENCOUNTER — Ambulatory Visit: Payer: Medicaid Other | Attending: Family Medicine | Admitting: Family Medicine

## 2019-07-23 ENCOUNTER — Encounter: Payer: Self-pay | Admitting: Family Medicine

## 2019-07-23 ENCOUNTER — Other Ambulatory Visit: Payer: Self-pay | Admitting: Internal Medicine

## 2019-07-23 VITALS — BP 128/78 | HR 98 | Temp 98.3°F | Ht 69.0 in | Wt 205.0 lb

## 2019-07-23 DIAGNOSIS — M7050 Other bursitis of knee, unspecified knee: Secondary | ICD-10-CM

## 2019-07-23 DIAGNOSIS — Z1159 Encounter for screening for other viral diseases: Secondary | ICD-10-CM

## 2019-07-23 DIAGNOSIS — D320 Benign neoplasm of cerebral meninges: Secondary | ICD-10-CM

## 2019-07-23 DIAGNOSIS — R569 Unspecified convulsions: Secondary | ICD-10-CM

## 2019-07-23 DIAGNOSIS — Z1211 Encounter for screening for malignant neoplasm of colon: Secondary | ICD-10-CM

## 2019-07-23 MED ORDER — SUCRALFATE 1 G PO TABS: 1.0000 g | ORAL_TABLET | Freq: Three times a day (TID) | ORAL | 0 refills | Status: DC

## 2019-07-23 MED ORDER — CYCLOBENZAPRINE HCL 10 MG PO TABS: 10.0000 mg | ORAL_TABLET | Freq: Two times a day (BID) | ORAL | 3 refills | Status: DC | PRN

## 2019-07-23 MED ORDER — LORAZEPAM 1 MG PO TABS: 1.0000 mg | ORAL_TABLET | Freq: Three times a day (TID) | ORAL | 0 refills | Status: DC | PRN

## 2019-07-23 NOTE — Progress Notes (Signed)
Pain in back of legs. 

## 2019-07-23 NOTE — Patient Instructions (Signed)
Once I review your test results, I will be in touch with you via 'my chart' or a phone call from my office (if you are not signed up for my chart).  

## 2019-07-23 NOTE — Progress Notes (Signed)
Subjective:  Patient ID: Joshua [name], male    DOB: 05-Jul-1961  Age: 59 y.o. MRN: [id number]  CC: Hypertension   HPI Joshua [name] is a 59 year old right-handed male male with a history of meningioma status post resection in 2014 hospitalized at [facility] from 01/03/2019 through 01/14/2019 for recurrent meningioma status post resection (the last of which was in 12/2018), seizures here for a follow-up visit.  He states PT is slow he says; he uses a cane. Complains of persisting pain in b/l popliteal region "like a Charlie horse" which he states is like pulsating. Denies calf pain or swelling.  At his last visit he had complained of left leg pain for which Flexeril was prescribed and he reports doing well on it. He quit smoking 6 months ago.  He had an ED visit after his last seizure 2 weeks ago after he had transiently run out of his antiseizure medications.  He had a follow-up visit with his oncologist last week with no regimen changes made. He continues to have a low mood due to his chronic medical conditions and the fact that his recovery process is slow. Also requests a refill of sucralfate which he states he was prescribed for "cough".  He denies presence of reflux or abdominal pain.  Past Medical History:  Diagnosis Date  . Brain tumor ([place]) 02/20/2013   brain tumor removed in March 2014, Dr Donald Pore  . Dizziness   . Enlarged prostate   . Headache(784.0)    scattered  . Meningioma ([place])   . Seizures ([place])     Past Surgical History:  Procedure Laterality Date  . APPLICATION OF CRANIAL NAVIGATION N/A 01/10/2019   Procedure: APPLICATION OF CRANIAL NAVIGATION;  Surgeon: Erline Levine, MD;  Location: [facility];  Service: Neurosurgery;  Laterality: N/A;  . CATARACT EXTRACTION Right   . CRANIOTOMY Right 11/06/2012   Procedure: CRANIOTOMY TUMOR EXCISION;  Surgeon: Erline Levine, MD;  Location: [facility];  Service: Neurosurgery;  Laterality: Right;  Right Parasagittal  craniotomy for meningioma with Stealth  . CRANIOTOMY Right 01/10/2019   Procedure: Right Parasagittal Craniotomy for Tumor;  Surgeon: Erline Levine, MD;  Location: [facility];  Service: Neurosurgery;  Laterality: Right;  Right parasagittal craniotomy for tumor  . ESOPHAGOGASTRODUODENOSCOPY    . right knee arthroscopy      Family History  Problem Relation Age of Onset  . Hypertension Mother   . Hypertension Father     No Known Allergies  Outpatient Medications Prior to Visit  Medication Sig Dispense Refill  . aspirin EC 81 MG EC tablet Take 1 tablet (81 mg total) by mouth daily. 30 tablet 3  . clonazePAM (KLONOPIN) 0.5 MG tablet Take 1 tablet (0.5 mg total) by mouth 2 (two) times daily. Take 1 tablet twice per day for the next 7 days. 60 tablet 1  . cyclobenzaprine (FLEXERIL) 10 MG tablet Take 1 tablet (10 mg total) by mouth 2 (two) times daily as needed for muscle spasms. 60 tablet 1  . FIBER ADULT GUMMIES PO Take 1 tablet by mouth daily.    Marland Kitchen lamoTRIgine (LAMICTAL) 100 MG tablet TAKE 1 TABLET(100 MG) BY MOUTH TWICE DAILY 60 tablet 2  . levETIRAcetam (KEPPRA) 500 MG tablet TAKE 3 TABLETS(1500 MG) BY MOUTH TWICE DAILY 180 tablet 1  . LORazepam (ATIVAN) 1 MG tablet Take 1 tablet (1 mg total) by mouth every 8 (eight) hours as needed for seizure (for breakthrough seizures). 12 tablet 0  . Melatonin 5  MG CHEW Chew 3 tablets by mouth at bedtime as needed (sleep).    . naproxen sodium (ALEVE) 220 MG tablet Take 440 mg by mouth as needed (pain or headache).    . phenytoin (DILANTIN) 300 MG ER capsule Take 1 capsule (300 mg total) by mouth at bedtime. 30 capsule 3  . prochlorperazine (COMPAZINE) 10 MG tablet Take 1 tablet (10 mg total) by mouth every 6 (six) hours as needed for nausea or vomiting. 30 tablet 1  . triamcinolone cream (KENALOG) 0.1 % Apply 1 application topically 2 (two) times daily. 30 g 1  . vitamin C (ASCORBIC ACID) 500 MG tablet Take 500 mg by mouth daily.    . sucralfate (CARAFATE)  1 g tablet Take 1 tablet (1 g total) by mouth 4 (four) times daily -  with meals and at bedtime for 14 days. 56 tablet 0   No facility-administered medications prior to visit.     ROS Review of Systems  Constitutional: Negative for activity change and appetite change.  HENT: Negative for sinus pressure and sore throat.   Eyes: Negative for visual disturbance.  Respiratory: Negative for cough, chest tightness and shortness of breath.   Cardiovascular: Negative for chest pain and leg swelling.  Gastrointestinal: Negative for abdominal distention, abdominal pain, constipation and diarrhea.  Endocrine: Negative.   Genitourinary: Negative for dysuria.  Musculoskeletal:       See HPI  Skin: Negative for rash.  Allergic/Immunologic: Negative.   Neurological: Negative for weakness, light-headedness and numbness.  Psychiatric/Behavioral: Negative for dysphoric mood and suicidal ideas.    Objective:  BP 128/78   Pulse 98   Temp 98.3 F (36.8 C) (Oral)   Ht 5\' 9"  (1.753 m)   Wt 205 lb (93 kg)   SpO2 94%   BMI 30.27 kg/m   BP/Weight 07/23/2019 07/10/2019 [id number]  Systolic BP 0000000 [id number]  Diastolic BP 78 90 90  Wt. (Lbs) 205 - 185  BMI 30.27 - 27.32      Physical Exam Constitutional:      Appearance: He is well-developed.  Neck:     Vascular: No JVD.  Cardiovascular:     Rate and Rhythm: Normal rate.     Heart sounds: Normal heart sounds. No murmur.  Pulmonary:     Effort: Pulmonary effort is normal.     Breath sounds: Normal breath sounds. No wheezing or rales.  Chest:     Chest wall: No tenderness.  Abdominal:     General: Bowel sounds are normal. There is no distension.     Palpations: Abdomen is soft. There is no mass.     Tenderness: There is no abdominal tenderness.  Musculoskeletal:        General: Normal range of motion.     Right lower leg: No edema.     Left lower leg: No edema.     Comments: Negative Homans' sign bilaterally No tenderness on palpation  of bilateral popliteal fossa  Neurological:     Mental Status: He is alert and oriented to person, place, and time.     Gait: Gait abnormal (high stepping gait).  Psychiatric:        Mood and Affect: Mood normal.     CMP Latest Ref Rng & Units 07/09/2019 07/04/2019 03/12/2019  Glucose 70 - 99 mg/dL 123(H) 99 145(H)  BUN 6 - 20 mg/dL 10 10 14   Creatinine 0.61 - 1.24 mg/dL 0.88 0.90 0.83  Sodium 135 - 145 mmol/L 139 138 140  Potassium 3.5 - 5.1 mmol/L 4.0 4.0 4.1  Chloride 98 - 111 mmol/L 106 104 104  CO2 22 - 32 mmol/L 23 24 27   Calcium 8.9 - 10.3 mg/dL 9.1 9.3 9.3  Total Protein 6.5 - 8.1 g/dL 6.2(L) - -  Total Bilirubin 0.3 - 1.2 mg/dL 0.3 - -  Alkaline Phos 38 - 126 U/L 68 - -  AST 15 - 41 U/L 20 - -  ALT 0 - 44 U/L 25 - -    Lipid Panel  No results found for: CHOL, TRIG, HDL, CHOLHDL, VLDL, LDLCALC, LDLDIRECT  CBC    Component Value Date/Time   WBC 5.6 07/09/2019 0857   RBC 4.93 07/09/2019 0857   HGB 15.2 07/09/2019 0857   HCT 44.6 07/09/2019 0857   PLT 227 07/09/2019 0857   MCV 90.5 07/09/2019 0857   MCH 30.8 07/09/2019 0857   MCHC 34.1 07/09/2019 0857   RDW 12.3 07/09/2019 0857   LYMPHSABS 1.6 07/09/2019 0857   MONOABS 0.6 07/09/2019 0857   EOSABS 0.1 07/09/2019 0857   BASOSABS 0.1 07/09/2019 0857    No results found for: HGBA1C  Assessment & Plan:    1. Popliteal bursitis, unspecified laterality Low suspicion for DVT - cyclobenzaprine (FLEXERIL) 10 MG tablet; Take 1 tablet (10 mg total) by mouth 2 (two) times daily as needed for muscle spasms.  Dispense: 60 tablet; Refill: 3 - [place] RT LOWER EXTREM LTD SOFT TISSUE NON VASCULAR; Future - [place] LT LOWER EXTREM LTD SOFT TISSUE NON VASCULAR; Future  2. Recurrent meningioma of the brain [facility]) Status post resection With persisting ambulation difficulties Continue PT Continue antiseizure medication  3. Seizure ([place]) Last seizure was 2 weeks ago Encouraged to remain compliant with antiepileptics Followed by  oncology  4. Need for hepatitis C screening test - Hepatitis c antibody (reflex)  5. Screening for colon cancer - Ambulatory referral to Gastroenterology   Return in about 6 months (around 01/20/2020) for Medical conditions.    Charlott Rakes, MD, FAAFP. [facility] [place], [place]   07/23/2019, 9:21 AM

## 2019-07-24 LAB — HEPATITIS C ANTIBODY (REFLEX): HCV Ab: <0.1 s/co ratio (ref 0.0–0.9)

## 2019-07-24 LAB — HCV COMMENT:

## 2019-07-25 ENCOUNTER — Other Ambulatory Visit: Payer: Self-pay

## 2019-07-25 ENCOUNTER — Ambulatory Visit: Payer: Medicaid Other | Attending: Internal Medicine

## 2019-07-25 DIAGNOSIS — M6281 Muscle weakness (generalized): Secondary | ICD-10-CM | POA: Diagnosis not present

## 2019-07-25 DIAGNOSIS — R2689 Other abnormalities of gait and mobility: Secondary | ICD-10-CM | POA: Diagnosis not present

## 2019-07-25 NOTE — Therapy (Signed)
[facility] [address] [place], [place], [address] Phone: [phone]   Fax:  [phone]  Physical Therapy Treatment  Patient Details  Name: Joshua [name] MRN: [id number] Date of Birth: [birth year] Referring Provider (PT): Cecil Cobbs   Encounter Date: 07/25/2019  PT End of Session - 07/25/19 1021    Visit Number  9    Number of Visits  12    Date for PT Re-Evaluation  10/13/19    Authorization Type  Medicaid 3 visits 06/03/11/02/2019, 12/10-12/30/20 6 units, 1/7-1/27/2021 3 visits    Authorization - Visit Number  1    Authorization - Number of Visits  3    PT Start Time  1019    PT Stop Time  1101    PT Time Calculation (min)  42 min    Activity Tolerance  Patient tolerated treatment well    Behavior During Therapy  [facility] for tasks assessed/performed       Past Medical History:  Diagnosis Date  . Brain tumor ([place]) 02/20/2013   brain tumor removed in March 2014, Dr Donald Pore  . Dizziness   . Enlarged prostate   . Headache(784.0)    scattered  . Meningioma ([place])   . Seizures ([place])     Past Surgical History:  Procedure Laterality Date  . APPLICATION OF CRANIAL NAVIGATION N/A 01/10/2019   Procedure: APPLICATION OF CRANIAL NAVIGATION;  Surgeon: Erline Levine, MD;  Location: [facility];  Service: Neurosurgery;  Laterality: N/A;  . CATARACT EXTRACTION Right   . CRANIOTOMY Right 11/06/2012   Procedure: CRANIOTOMY TUMOR EXCISION;  Surgeon: Erline Levine, MD;  Location: [facility];  Service: Neurosurgery;  Laterality: Right;  Right Parasagittal craniotomy for meningioma with Stealth  . CRANIOTOMY Right 01/10/2019   Procedure: Right Parasagittal Craniotomy for Tumor;  Surgeon: Erline Levine, MD;  Location: [facility];  Service: Neurosurgery;  Laterality: Right;  Right parasagittal craniotomy for tumor  . ESOPHAGOGASTRODUODENOSCOPY    . right knee arthroscopy      There were no vitals filed for this visit.  Subjective  Assessment - 07/25/19 1023    Subjective  Pt reports that he is starting to gain some weight. Unsure when visit for AFO fitting is.    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 History of right THA on 2019.    Patient Stated Goals  Pt would like to be able to elimate his cane use.    Currently in Pain?  No/denies    Pain Onset  More than a month ago                       [facility] Adult PT Treatment/Exercise - 07/25/19 1029      Transfers   Transfers  Sit to Stand;Stand to Sit    Sit to Stand  6: Modified independent (Device/Increase time)    Stand to Sit  6: Modified independent (Device/Increase time)      Ambulation/Gait   Ambulation/Gait  Yes    Ambulation/Gait Assistance  5: Supervision    Ambulation/Gait Assistance Details  Verbal cues to increase left hip flexion and stand erect.    Ambulation Distance (Feet)  345 Feet    Assistive device  None    Gait Pattern  Decreased step length - right;Decreased hip/knee flexion - left;Decreased dorsiflexion - left    Ambulation Surface  Level;Indoor  Neuro Re-ed    Neuro Re-ed Details   In // bars:reciprocal steps over 3 foams with fingertip support on right x 8  close SBA. Pt had some diffiiculty clearing left foot completely at times and some instability when stepping. Stepping left foot over and back foam x 10 without UE support CGA. Pt with more difficulty with bringing back with less clearance.      Exercises   Exercises  Other Exercises    Other Exercises   Standing in // bars: left hamstring curl 10 x 2 with 3# ankle weight, standing left hip ext 10 x 2 with 3# ankle weights.  Marching gait with 3# ankle weight on left leg with finger tip support in // bars 6' x 6.  Pt was given verbal cues for erect posture.             PT Education - 07/25/19 1114    Education Details   Added standing hip ext and hamstring curl to HEP. Pt going to purchase ankle weight that is adjustable.    Person(s) Educated  Patient    Methods  Explanation;Demonstration;Handout    Comprehension  Verbalized understanding;Returned demonstration       PT Short Term Goals - 07/15/19 0827      PT SHORT TERM GOAL #1   Title  Pt will start aquatic PT and be instructed in HEP.    Baseline  currently not doing aquatic PT    Time  4    Period  Weeks    Status  New    Target Date  08/19/19        PT Long Term Goals - 07/15/19 [id number]      PT LONG TERM GOAL #1   Title  Pt will be independent with progressive HEP for strength and balance to continue gains on own.    Baseline  Pt has been working on current HEP but PT will be adding to it.    Time  8    Period  Weeks    Status  On-going    Target Date  09/16/19      PT LONG TERM GOAL #2   Title  Pt will increase gait speed to >0.84 m/s for improved gait safety in community.    Baseline  0.61m/s on 07/15/2019    Time  8    Period  Weeks    Status  Revised    Target Date  09/16/19      PT LONG TERM GOAL #3   Title  Pt will increase Berg score form 44/56 to >50/56 for improved balance and decreased fall risk.    Baseline  48/56 on 07/15/2019    Period  Weeks    Status  Revised    Target Date  09/16/19      PT LONG TERM GOAL #4   Title  Pt will ambulate >500' on varied surfaces independently without AD for improved community mobility.    Baseline  345 mod I with SPC and supervision with no AD on level surfaces    Time  8    Period  Weeks    Status  On-going    Target Date  09/16/19      PT LONG TERM GOAL #5   Title  Pt will increase FGA from 14 to >19 for improved balance and decreased fall risk.    Baseline  FGA=14/30 on 07/15/2019    Time  8    Period  Weeks  Status  New    Target Date  09/16/19            Plan - 07/25/19 1116    Clinical Impression Statement  PT focused on more left LE strengthening during  visit today. Pt's left leg fatigues with longer walker and continues to have decreased foot clearance. Pt awaiting fitting for AFO and given [employer] number as he could not remember when apppointment was.    Examination-Activity Limitations  Locomotion Level;Stairs;Dressing    Examination-Participation Restrictions  Community Activity    Stability/Clinical Decision Making  Evolving/Moderate complexity    Rehab Potential  Good    PT Frequency  2x / week    PT Duration  8 weeks    PT Treatment/Interventions  ADLs/Self Care Home Management;DME Instruction;Gait training;Stair training;Functional mobility training;Therapeutic activities;Patient/family education;Neuromuscular re-education;Balance training;Therapeutic exercise;Orthotic Fit/Training;Manual techniques;Passive range of motion;Aquatic Therapy    PT Next Visit Plan  Continue with gait on varied surfaces, standing and dynamic balance activities, left LE strengthening, referral has been given for aquatics. Did patient find out when AFO fitting is scheduled for?    Consulted and Agree with Plan of Care  Patient       Patient will benefit from skilled therapeutic intervention in order to improve the following deficits and impairments:  Abnormal gait, Decreased activity tolerance, Decreased balance, Difficulty walking, Decreased strength, Impaired sensation, Postural dysfunction, Impaired flexibility, Decreased mobility, Decreased range of motion  Visit Diagnosis: Other abnormalities of gait and mobility  Muscle weakness (generalized)     Problem List Patient Active Problem List   Diagnosis Date Noted  . 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[place]) 03/07/2019  . Seizures ([place]) 01/31/2019  . Meningioma, recurrent of brain ([place]) 01/31/2019  . Meningioma ([place]) 01/03/2019  . Brain tumor ([place]) 12/27/2018  . Diffuse idiopathic skeletal hyperostosis 06/05/2013    Electa Sniff, PT, DPT, NCS 07/25/2019, 11:19 AM  [facility] [address] [place], [place], [address] Phone: [phone]   Fax:  [phone]  Name: Joshua [name] MRN: [id number] Date of Birth: 07/26/1960

## 2019-07-25 NOTE — Patient Instructions (Signed)
Access Code: [email or web address]  URL: [email or web address]  Date: 07/25/2019  Prepared by: Cherly Anderson   Exercises Tandem Stance - 3 reps - 1 sets - 30 sec hold - 2x daily - 7x weekly Walking March - 3 reps - 1 sets - 1x daily - 7x weekly Tandem Walking with Counter Support - 3 reps - 1 sets - 2x daily - 7x weekly Side Stepping with Counter Support - 3 reps - 1 sets - 2x daily - 7x weekly Romberg Stance on Foam Pad - 3 reps - 1 sets - 30 sec hold - 2x daily - 7x weekly Romberg Stance on Foam Pad with Head Rotation - 10 reps - 1 sets - 2x daily - 7x weekly Standing Balance with Eyes Closed on Foam - 3 reps - 1 sets - 30 sec hold - 2x daily - 7x weekly Mini Squat with Counter Support - 10 reps - 2 sets - 2x daily - 7x weekly Active assist ankle DF then resisted PF - 10 reps - 3 sets - 2x daily - 7x weekly Ankle Inversion Eversion Towel Slide - 10 reps - 3 sets - 1x daily - 7x weekly Standing Hip Extension with Ankle Weight - 10 reps - 2 sets - 1x daily - 5x weekly Standing Alternating Knee Flexion with Ankle Weights - 10 reps - 2 sets - 1x daily - 5x weekly

## 2019-07-26 ENCOUNTER — Ambulatory Visit: Payer: Medicaid Other

## 2019-07-29 ENCOUNTER — Other Ambulatory Visit: Payer: Self-pay | Admitting: Family Medicine

## 2019-07-29 DIAGNOSIS — M7050 Other bursitis of knee, unspecified knee: Secondary | ICD-10-CM

## 2019-07-29 DIAGNOSIS — M79605 Pain in left leg: Secondary | ICD-10-CM

## 2019-07-30 ENCOUNTER — Emergency Department (HOSPITAL_COMMUNITY)
Admission: EM | Admit: 2019-07-30 | Discharge: 2019-07-30 | Discharge status: Against Medical Advice | Payer: Medicaid Other | Attending: Emergency Medicine | Admitting: Emergency Medicine

## 2019-07-30 ENCOUNTER — Other Ambulatory Visit: Payer: Self-pay | Admitting: Internal Medicine

## 2019-07-30 ENCOUNTER — Emergency Department (HOSPITAL_BASED_OUTPATIENT_CLINIC_OR_DEPARTMENT_OTHER): Payer: Medicaid Other

## 2019-07-30 ENCOUNTER — Encounter (HOSPITAL_COMMUNITY): Payer: Self-pay | Admitting: Emergency Medicine

## 2019-07-30 DIAGNOSIS — M79662 Pain in left lower leg: Secondary | ICD-10-CM | POA: Diagnosis present

## 2019-07-30 DIAGNOSIS — R609 Edema, unspecified: Secondary | ICD-10-CM | POA: Diagnosis not present

## 2019-07-30 DIAGNOSIS — M7989 Other specified soft tissue disorders: Secondary | ICD-10-CM

## 2019-07-30 DIAGNOSIS — Z5321 Procedure and treatment not carried out due to patient leaving prior to being seen by health care provider: Secondary | ICD-10-CM | POA: Diagnosis not present

## 2019-07-30 DIAGNOSIS — R600 Localized edema: Secondary | ICD-10-CM

## 2019-07-30 NOTE — ED Notes (Signed)
Pt states he can't stay, doesn't want to see a provider.

## 2019-07-30 NOTE — Progress Notes (Signed)
Attempted lower extremity venous duplex at 11:30am and 12:06 PM, however the patient is currently in the waiting room, and I am unable to reach Nurse first or triage to put patient in room for exam. Will attempt again later as schedule permits.  07/30/2019 12:07 PM Maudry Mayhew, MHA, RVT, RDCS, RDMS

## 2019-07-30 NOTE — ED Notes (Signed)
Pt arrives back to lobby after his vascular study and states he was told by the vascular tech his scan was "fine" so he wishes to left before seeing a provider. Attempted to convince pt to stay as he may need further test pt refused has called sister to pick him up.

## 2019-07-30 NOTE — ED Triage Notes (Signed)
Pt started having left leg pain behind knee and into his foot. Pts leg is warm and swollen. Pt takes 81mg  asa daily.

## 2019-07-30 NOTE — Progress Notes (Signed)
Left lower extremity venous doppler has been completed, results are located under CV Proc.  Darlina Sicilian [facility] 07/30/19 2:35 pm

## 2019-08-01 ENCOUNTER — Ambulatory Visit: Payer: Medicaid Other | Admitting: Physical Therapy

## 2019-08-01 ENCOUNTER — Other Ambulatory Visit: Payer: Self-pay

## 2019-08-01 ENCOUNTER — Encounter: Payer: Self-pay | Admitting: Physical Therapy

## 2019-08-01 ENCOUNTER — Encounter (HOSPITAL_COMMUNITY): Payer: Medicaid Other

## 2019-08-01 DIAGNOSIS — M6281 Muscle weakness (generalized): Secondary | ICD-10-CM | POA: Diagnosis not present

## 2019-08-01 DIAGNOSIS — R2689 Other abnormalities of gait and mobility: Secondary | ICD-10-CM | POA: Diagnosis not present

## 2019-08-02 ENCOUNTER — Ambulatory Visit (HOSPITAL_COMMUNITY): Payer: Medicaid Other

## 2019-08-02 ENCOUNTER — Telehealth: Payer: Self-pay | Admitting: Family Medicine

## 2019-08-02 NOTE — Therapy (Signed)
[facility] [address] [place], [place], [address] Phone: [phone]   Fax:  [phone]  Physical Therapy Treatment  Patient Details  Name: Joshua [name] MRN: [id number] Date of Birth: 14-May-1961 Referring Provider (PT): Cecil Cobbs   Encounter Date: 08/01/2019   08/01/19 0723  PT Visits / Re-Eval  Visit Number 10  Number of Visits 12  Date for PT Re-Evaluation 10/13/19  Authorization  Authorization Type Medicaid 3 visits 06/03/11/02/2019, 12/10-12/30/20 6 units, 1/7-1/27/2021 3 visits  Authorization - Visit Number 2  Authorization - Number of Visits 3  PT Time Calculation  PT Start Time 0720  PT Stop Time 0800  PT Time Calculation (min) 40 min  PT - End of Session  Equipment Utilized During Treatment Gait belt  Activity Tolerance Patient tolerated treatment well;No increased pain  Behavior During Therapy WFL for tasks assessed/performed      Past Medical History:  Diagnosis Date  . Brain tumor ([place]) 02/20/2013   brain tumor removed in March 2014, Dr Donald Pore  . Dizziness   . Enlarged prostate   . Headache(784.0)    scattered  . Meningioma ([place])   . Seizures ([place])     Past Surgical History:  Procedure Laterality Date  . APPLICATION OF CRANIAL NAVIGATION N/A 01/10/2019   Procedure: APPLICATION OF CRANIAL NAVIGATION;  Surgeon: Erline Levine, MD;  Location: [facility];  Service: Neurosurgery;  Laterality: N/A;  . CATARACT EXTRACTION Right   . CRANIOTOMY Right 11/06/2012   Procedure: CRANIOTOMY TUMOR EXCISION;  Surgeon: Erline Levine, MD;  Location: [facility];  Service: Neurosurgery;  Laterality: Right;  Right Parasagittal craniotomy for meningioma with Stealth  . CRANIOTOMY Right 01/10/2019   Procedure: Right Parasagittal Craniotomy for Tumor;  Surgeon: Erline Levine, MD;  Location: [facility];  Service: Neurosurgery;  Laterality: Right;  Right parasagittal craniotomy for tumor  . ESOPHAGOGASTRODUODENOSCOPY     . right knee arthroscopy      There were no vitals filed for this visit.     08/01/19 0722  Symptoms/Limitations  Subjective No new complaints. No falls. Was seen in ED this week due to LE swelling/pain, dopplar ruled out DVT. Reports the swelling in better today. Has spoke to [facility], they are waiting on insurance authoriation.  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 History of right THA on 2019.  Patient Stated Goals Pt would like to be able to elimate his cane use.  Pain Assessment  Currently in Pain? No/denies  Pain Score 0      08/01/19 0724  Transfers  Transfers Sit to Stand;Stand to Sit  Sit to Stand 6: Modified independent (Device/Increase time)  Stand to Sit 6: Modified independent (Device/Increase time)  Ambulation/Gait  Ambulation/Gait Yes  Ambulation/Gait Assistance 5: Supervision;4: Min guard  Ambulation/Gait Assistance Details cues for incr left knee flexion/DF with swing phase of gait. This was improved after activity in parallel bars working on hip/knee flexion.  Ambulation Distance (Feet) 115 Feet (x2)  Assistive device None  Gait Pattern Decreased step length - right;Decreased hip/knee flexion - left;Decreased dorsiflexion - left  Ambulation Surface Level;Indoor  Neuro Re-ed   Neuro Re-ed Details  for strengthening/muscle re-ed: tall kneeling on red mat with UE support on blue mat table: mini squats in midline for 10 reps with limited hip flexion noted, after rest break performed a second set with slight increase in hip flexion noted. Staying tall- worked  on moving LE fwd/bwd x 10 reps each side, then out/in for 10 reps each side, cues on posture with assist needed to lift foot/LE with movements;in parallel bars- red band resistance to left LE for fwd heel taps to 4 inch box with emphasis on knee flexion/DF with manual  assist to prevent hip hiking for ~20 reps.   Exercises  Exercises Other Exercises  Other Exercises  seated green band: ankle assisted DF with resisted PF, then resisted eversion for 10 reps each. Issued green band for pt to start using at home as red band is too easy.         08/01/19 0750  Balance Exercises: Standing  Balance Beam standing across red beam: intermittent UE assist for fwd stepping to floor/back onto beam for 4 reps each side. increased time needed with up to mod assist for balance due to posterior lean.   Other Standing Exercises seated with feet across red foam beam: sit<>stands x 10 reps no UE support      PT Short Term Goals - 07/15/19 0827      PT SHORT TERM GOAL #1   Title  Pt will start aquatic PT and be instructed in HEP.    Baseline  currently not doing aquatic PT    Time  4    Period  Weeks    Status  New    Target Date  08/19/19        PT Long Term Goals - 07/15/19 [id number]      PT LONG TERM GOAL #1   Title  Pt will be independent with progressive HEP for strength and balance to continue gains on own.    Baseline  Pt has been working on current HEP but PT will be adding to it.    Time  8    Period  Weeks    Status  On-going    Target Date  09/16/19      PT LONG TERM GOAL #2   Title  Pt will increase gait speed to >0.84 m/s for improved gait safety in community.    Baseline  0.41m/s on 07/15/2019    Time  8    Period  Weeks    Status  Revised    Target Date  09/16/19      PT LONG TERM GOAL #3   Title  Pt will increase Berg score form 44/56 to >50/56 for improved balance and decreased fall risk.    Baseline  48/56 on 07/15/2019    Period  Weeks    Status  Revised    Target Date  09/16/19      PT LONG TERM GOAL #4   Title  Pt will ambulate >500' on varied surfaces independently without AD for improved community mobility.    Baseline  345 mod I with SPC and supervision with no AD on level surfaces    Time  8    Period  Weeks    Status   On-going    Target Date  09/16/19      PT LONG TERM GOAL #5   Title  Pt will increase FGA from 14 to >19 for improved balance and decreased fall risk.    Baseline  FGA=14/30 on 07/15/2019    Time  8    Period  Weeks    Status  New    Target Date  09/16/19         08/01/19 0723  Plan  Clinical Impression Statement Today's skilled  session continued to focus on gait mechanics, LE strengthening and balance reactions. Rest breaks taken due to fatigue. The pt demo'd improved hip/knee flexion with second gait lap after NMR activities in parallle bars. The pt is making steady progress toward goals and should benefit from continued PT to progress toward unmet goals.  Examination-Activity Limitations Locomotion Level;Stairs;Dressing  Examination-Participation Restrictions Community Activity  Pt will benefit from skilled therapeutic intervention in order to improve on the following deficits Abnormal gait;Decreased activity tolerance;Decreased balance;Difficulty walking;Decreased strength;Impaired sensation;Postural dysfunction;Impaired flexibility;Decreased mobility;Decreased range of motion  Stability/Clinical Decision Making Evolving/Moderate complexity  Rehab Potential Good  PT Frequency 2x / week  PT Duration 8 weeks  PT Treatment/Interventions ADLs/Self Care Home Management;DME Instruction;Gait training;Stair training;Functional mobility training;Therapeutic activities;Patient/family education;Neuromuscular re-education;Balance training;Therapeutic exercise;Orthotic Fit/Training;Manual techniques;Passive range of motion;Aquatic Therapy  PT Next Visit Plan submit to Medicaid for additional visits at next session- address goals for this  Consulted and Agree with Plan of Care Patient          Patient will benefit from skilled therapeutic intervention in order to improve the following deficits and impairments:  Abnormal gait, Decreased activity tolerance, Decreased balance, Difficulty  walking, Decreased strength, Impaired sensation, Postural dysfunction, Impaired flexibility, Decreased mobility, Decreased range of motion  Visit Diagnosis: Other abnormalities of gait and mobility  Muscle weakness (generalized)     Problem List Patient Active Problem List   Diagnosis Date Noted  . 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[place]) 03/07/2019  . Seizures ([place]) 01/31/2019  . Meningioma, recurrent of brain ([place]) 01/31/2019  . Meningioma ([place]) 01/03/2019  . Brain tumor ([place]) 12/27/2018  . Diffuse idiopathic skeletal hyperostosis 06/05/2013    Willow Ora, PTA, [facility] Outpatient Neuro [facility] [address], [place], [place] [address] [phone] 08/02/19, 7:13 PM   Name: Joshua [name] MRN: [id number] Date of Birth: 02/24/1961

## 2019-08-05 ENCOUNTER — Other Ambulatory Visit: Payer: Self-pay

## 2019-08-05 ENCOUNTER — Encounter (HOSPITAL_COMMUNITY): Payer: Self-pay

## 2019-08-05 ENCOUNTER — Ambulatory Visit (HOSPITAL_COMMUNITY)
Admission: EM | Admit: 2019-08-05 | Discharge: 2019-08-05 | Disposition: A | Discharge status: Home / Self Care | Payer: Medicaid Other | Attending: Family Medicine | Admitting: Family Medicine

## 2019-08-05 DIAGNOSIS — Z131 Encounter for screening for diabetes mellitus: Secondary | ICD-10-CM | POA: Diagnosis not present

## 2019-08-05 DIAGNOSIS — R600 Localized edema: Secondary | ICD-10-CM

## 2019-08-05 LAB — GLUCOSE, CAPILLARY: Glucose-Capillary: 106 mg/dL — ABNORMAL HIGH (ref 70–99)

## 2019-08-05 LAB — CBG MONITORING, ED: Glucose-Capillary: 106 mg/dL — ABNORMAL HIGH (ref 70–99)

## 2019-08-05 MED ORDER — FUROSEMIDE 20 MG PO TABS: 20.0000 mg | ORAL_TABLET | Freq: Every day | ORAL | 0 refills | Status: DC

## 2019-08-05 NOTE — ED Provider Notes (Signed)
[facility]   :[id number] 08/05/19 Arrival Time: 1010  ASSESSMENT & PLAN:  1. Bilateral lower extremity edema     See AVS for discharge instructions/information.  Begin short trial of: Meds ordered this encounter  Medications  . furosemide (LASIX) 20 MG tablet    Sig: Take 1 tablet (20 mg total) by mouth daily.    Dispense:  7 tablet    Refill:  0    Vascular U/S dated 07/30/19 reviewed by me: no DVT.  Follow-up Information    Charlott Rakes, MD.   Specialty: Family Medicine Why: If worsening or failing to improve as anticipated. Contact information: [place] [address] [phone]           Reviewed expectations re: course of current medical issues. Questions answered. Outlined signs and symptoms indicating need for more acute intervention. Patient verbalized understanding. After Visit Summary given.   SUBJECTIVE: History from: patient. Joshua [name] is a 59 y.o. male who presents with complaint of intermittent bilateral LE edema. First noted over the past few weeks; more so over the past week. Discussed with PCP who sent for an U/S that revealed no evidence of DVT. Reports LE itch when swollen; mild discomfort also. Thinks symptoms are slightly better in the morning. Ambulatory without difficulty; uses a cane. Otherwise, no specific aggravating or alleviating factors reported. No associated CP/SOB. Requests blood sugar check; has been borderline in the past per his report. No frequent thirst or urination. Reports weight is stable.   OBJECTIVE:  Vitals:   08/05/19 1045  BP: (!) 128/91  Pulse: 93  Resp: 18  Temp: 98.2 F (36.8 C)  TempSrc: Oral  SpO2: 96%    General appearance: alert; no distress Neck: supple  Lungs: speaks full sentences without difficulty; unlabored respirations Heart: regular Abdomen: soft Back: no CVA tenderness Extremities: mild bilateral edema around ankles; several excoriations of lower  legs where he has been scratching; no rashes Skin: warm and dry Neurologic: normal gait with cane Psychological: alert and cooperative; normal mood and affect  Labs:  Labs Reviewed  GLUCOSE, CAPILLARY - Abnormal; Notable for the following components:      Result Value   Glucose-Capillary 106 (*)    All other components within normal limits  CBG MONITORING, ED - Abnormal; Notable for the following components:   Glucose-Capillary 106 (*)    All other components within normal limits     No Known Allergies  Past Medical History:  Diagnosis Date  . Brain tumor ([place]) 02/20/2013   brain tumor removed in March 2014, Dr Donald Pore  . Dizziness   . Enlarged prostate   . Headache(784.0)    scattered  . Meningioma ([place])   . Seizures ([place])    Social History   Socioeconomic History  . Marital status: Married    Spouse name: Not on file  . Number of children: Not on file  . Years of education: Not on file  . Highest education level: Not on file  Occupational History  . Not on file  Tobacco Use  . Smoking status: Former Smoker    Types: Cigarettes    Quit date: 01/29/2019    Years since quitting: 0.5  . Smokeless tobacco: Never Used  . Tobacco comment: hasn't smoked in a week  Substance and Sexual Activity  . Alcohol use: Yes    Alcohol/week: 8.0 standard drinks    Types: 6 Cans of beer, 2 Shots of liquor per week  Comment: occasionally  . Drug use: No  . Sexual activity: Yes  Other Topics Concern  . Not on file  Social History Narrative  . Not on file   Social Determinants of Health   Financial Resource Strain:   . Difficulty of Paying Living Expenses: Not on file  Food Insecurity:   . Worried About Charity fundraiser in the Last Year: Not on file  . Ran Out of Food in the Last Year: Not on file  Transportation Needs: No Transportation Needs  . Lack of Transportation (Medical): No  . Lack of Transportation (Non-Medical): No  Physical Activity:   . Days of  Exercise per Week: Not on file  . Minutes of Exercise per Session: Not on file  Stress:   . Feeling of Stress : Not on file  Social Connections:   . Frequency of Communication with Friends and Family: Not on file  . Frequency of Social Gatherings with Friends and Family: Not on file  . Attends Religious Services: Not on file  . Active Member of Clubs or Organizations: Not on file  . Attends Archivist Meetings: Not on file  . Marital Status: Not on file  Intimate Partner Violence:   . Fear of Current or Ex-Partner: Not on file  . Emotionally Abused: Not on file  . Physically Abused: Not on file  . Sexually Abused: Not on file   Family History  Problem Relation Age of Onset  . Hypertension Mother   . Hypertension Father    Past Surgical History:  Procedure Laterality Date  . APPLICATION OF CRANIAL NAVIGATION N/A 01/10/2019   Procedure: APPLICATION OF CRANIAL NAVIGATION;  Surgeon: Erline Levine, MD;  Location: [facility];  Service: Neurosurgery;  Laterality: N/A;  . CATARACT EXTRACTION Right   . CRANIOTOMY Right 11/06/2012   Procedure: CRANIOTOMY TUMOR EXCISION;  Surgeon: Erline Levine, MD;  Location: [facility];  Service: Neurosurgery;  Laterality: Right;  Right Parasagittal craniotomy for meningioma with Stealth  . CRANIOTOMY Right 01/10/2019   Procedure: Right Parasagittal Craniotomy for Tumor;  Surgeon: Erline Levine, MD;  Location: [facility];  Service: Neurosurgery;  Laterality: Right;  Right parasagittal craniotomy for tumor  . ESOPHAGOGASTRODUODENOSCOPY    . right knee arthroscopy       Vanessa Kick, MD 08/05/19 1106

## 2019-08-05 NOTE — Discharge Instructions (Signed)
Swelling happens when fluid collects in small spaces around tissues and organs inside the body. Another word for swelling is "edema." Some common parts of the body where people can have swelling are the lower legs or hands. This typically is worse in the areas of the body that are closest to the ground (because of gravity)  Symptoms of swelling can include puffiness of the skin, which can cause the skin to look stretched and shiny. This often occurs with swelling in the lower legs and can be worse after you sit or stand for a long time.  Treatment of edema includes several components: treatment of the underlying cause (if possible), reducing the amount of salt (sodium) in your diet, and, in many cases, use of a medication called a diuretic to eliminate excess fluid. Using compression stockings and elevating the legs may also be recommended.   

## 2019-08-05 NOTE — ED Triage Notes (Signed)
Pt presents with left swelling and some mild pain X 4 days.

## 2019-08-07 NOTE — Progress Notes (Signed)
Patient ID: [name], male   DOB: 1961-05-26, 59 y.o.   MRN: [id number]  Virtual Visit via Telephone Note  I connected with Joshua [name] on 08/08/19 at 11:10 AM EST by telephone and verified that I am speaking with the correct person using two identifiers.   I discussed the limitations, risks, security and privacy concerns of performing an evaluation and management service by telephone and the availability of in person appointments. I also discussed with the patient that there may be a patient responsible charge related to this service. The patient expressed understanding and agreed to proceed.  PATIENT visit by telephone virtually in the context of Covid-19 pandemic. Patient location:  home My Location:  [facility] office Persons on the call:  Me and the patient   History of Present Illness: After ED visits 1/12 and 08/05/2019 for intermittent LE edema.  Neg vascular U/S 07/30/2019.  7 days of lasix prescribed.  He says the problem is getting better.  C/o itching all over.  Comes and goes every couple of days.  He has not noticed a rash.  No other new meds.  The itching has been going on for a while at least for several weeks.  No one else in the house is affected. Sometimes feels like he gets hives.  No bites.  LFT normal 06/2019.  CBC WNL too.   No new soaps or detergents.  No fever.    From ED note: SUBJECTIVE: History from: patient. Joshua [name] is a 59 y.o. male who presents with complaint of intermittent bilateral LE edema. First noted over the past few weeks; more so over the past week. Discussed with PCP who sent for an U/S that revealed no evidence of DVT. Reports LE itch when swollen; mild discomfort also. Thinks symptoms are slightly better in the morning. Ambulatory without difficulty; uses a cane. Otherwise, no specific aggravating or alleviating factors reported. No associated CP/SOB. Requests blood sugar check; has been borderline in the past per his report. No frequent  thirst or urination. Reports weight is stable.   Observations/Objective:  NAD.  A&Ox3   Assessment and Plan: 1. Urticaria ? Pityriasis rosea - cetirizine (ZYRTEC) 10 MG tablet; Take 1 tablet (10 mg total) by mouth daily.  Dispense: 30 tablet; Refill: 11  2. Edema, unspecified type Resolving.  Discussed water intake, limit salt and sugar  3. Encounter for examination following treatment at hospital  Follow Up Instructions: appt with PCP in 6-8 weeks    I discussed the assessment and treatment plan with the patient. The patient was provided an opportunity to ask questions and all were answered. The patient agreed with the plan and demonstrated an understanding of the instructions.   The patient was advised to call back or seek an in-person evaluation if the symptoms worsen or if the condition fails to improve as anticipated.  I provided 15 minutes of non-face-to-face time during this encounter.   Freeman Caldron, PA-C

## 2019-08-08 ENCOUNTER — Encounter: Payer: Self-pay | Admitting: Physical Therapy

## 2019-08-08 ENCOUNTER — Ambulatory Visit: Payer: Medicaid Other | Attending: Physician Assistant | Admitting: Physician Assistant

## 2019-08-08 ENCOUNTER — Ambulatory Visit: Payer: Medicaid Other | Admitting: Physical Therapy

## 2019-08-08 ENCOUNTER — Other Ambulatory Visit: Payer: Self-pay

## 2019-08-08 DIAGNOSIS — R609 Edema, unspecified: Secondary | ICD-10-CM

## 2019-08-08 DIAGNOSIS — R2689 Other abnormalities of gait and mobility: Secondary | ICD-10-CM | POA: Diagnosis not present

## 2019-08-08 DIAGNOSIS — L298 Other pruritus: Secondary | ICD-10-CM | POA: Diagnosis not present

## 2019-08-08 DIAGNOSIS — L509 Urticaria, unspecified: Secondary | ICD-10-CM | POA: Diagnosis not present

## 2019-08-08 DIAGNOSIS — M6281 Muscle weakness (generalized): Secondary | ICD-10-CM | POA: Diagnosis not present

## 2019-08-08 DIAGNOSIS — Z09 Encounter for follow-up examination after completed treatment for conditions other than malignant neoplasm: Secondary | ICD-10-CM

## 2019-08-08 MED ORDER — CETIRIZINE HCL 10 MG PO TABS: 10.0000 mg | ORAL_TABLET | Freq: Every day | ORAL | 11 refills | Status: DC

## 2019-08-09 NOTE — Therapy (Signed)
[facility] [address] [place], [place], [address] Phone: [phone]   Fax:  [phone]  Physical Therapy Treatment  Patient Details  Name: Joshua [name] MRN: [id number] Date of Birth: 23-Oct-1960 Referring Provider (PT): Cecil Cobbs   Encounter Date: 08/08/2019  PT End of Session - 08/08/19 1457    Visit Number  11    Number of Visits  12    Date for PT Re-Evaluation  10/13/19    Authorization Type  Medicaid 3 visits 06/03/11/02/2019, 12/10-12/30/20 6 units, 1/7-1/27/2021 3 visits    Authorization - Visit Number  3    Authorization - Number of Visits  3    PT Start Time  6546    PT Stop Time  1530    PT Time Calculation (min)  41 min    Equipment Utilized During Treatment  Gait belt    Activity Tolerance  Patient tolerated treatment well;No increased pain    Behavior During Therapy  WFL for tasks assessed/performed       Past Medical History:  Diagnosis Date  . Brain tumor ([place]) 02/20/2013   brain tumor removed in March 2014, Dr Donald Pore  . Dizziness   . Enlarged prostate   . Headache(784.0)    scattered  . Meningioma ([place])   . Seizures ([place])     Past Surgical History:  Procedure Laterality Date  . APPLICATION OF CRANIAL NAVIGATION N/A 01/10/2019   Procedure: APPLICATION OF CRANIAL NAVIGATION;  Surgeon: Erline Levine, MD;  Location: [facility];  Service: Neurosurgery;  Laterality: N/A;  . CATARACT EXTRACTION Right   . CRANIOTOMY Right 11/06/2012   Procedure: CRANIOTOMY TUMOR EXCISION;  Surgeon: Erline Levine, MD;  Location: [facility];  Service: Neurosurgery;  Laterality: Right;  Right Parasagittal craniotomy for meningioma with Stealth  . CRANIOTOMY Right 01/10/2019   Procedure: Right Parasagittal Craniotomy for Tumor;  Surgeon: Erline Levine, MD;  Location: [facility];  Service: Neurosurgery;  Laterality: Right;  Right parasagittal craniotomy for tumor  . ESOPHAGOGASTRODUODENOSCOPY    . right knee  arthroscopy      There were no vitals filed for this visit.  Subjective Assessment - 08/08/19 1455    Subjective  No falls. Has been back to ED since last visit due to LE swelling/pain. Was started on Lasix this past Monday. Had televisist with primary MD today- continue with Lasix and started on Zyrtec for itching/hives.    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 History of right THA on 2019.    Patient Stated Goals  Pt would like to be able to elimate his cane use.    Currently in Pain?  Yes    Pain Score  4     Pain Location  Generalized   left shoulder, ride flank/side   Pain Descriptors / Indicators  Aching    Pain Type  Chronic pain    Pain Onset  More than a month ago    Pain Frequency  Intermittent    Aggravating Factors   getting out of bed    Pain Relieving Factors  resting         [facility] PT Assessment - 08/08/19 1501      Ambulation/Gait   Ambulation/Gait  Yes    Ambulation/Gait Assistance  5: Supervision;4: Min guard    Ambulation/Gait Assistance Details  use of cane to enter/exit gym. no device used in session.  Assistive device  None    Gait Pattern  Decreased step length - right;Decreased hip/knee flexion - left;Decreased dorsiflexion - left    Ambulation Surface  Level;Indoor    Gait velocity  11.94 sec's= 0.83 m/s no AD      Berg Balance Test   Sit to Stand  Able to stand without using hands and stabilize independently    Standing Unsupported  Able to stand safely 2 minutes    Sitting with Back Unsupported but Feet Supported on Floor or Stool  Able to sit safely and securely 2 minutes    Stand to Sit  Sits safely with minimal use of hands    Transfers  Able to transfer safely, minor use of hands    Standing Unsupported with Eyes Closed  Able to stand 10 seconds safely    Standing Unsupported with Feet Together  Able  to place feet together independently and stand 1 minute safely    From Standing, Reach Forward with Outstretched Arm  Can reach confidently >25 cm (10")    From Standing Position, Pick up Object from Floor  Able to pick up shoe safely and easily    From Standing Position, Turn to Look Behind Over each Shoulder  Turn sideways only but maintains balance    Turn 360 Degrees  Able to turn 360 degrees safely one side only in 4 seconds or less   to right only   Standing Unsupported, Alternately Place Feet on Step/Stool  Able to complete 4 steps without aid or supervision    Standing Unsupported, One Foot in Front  Able to plae foot ahead of the other independently and hold 30 seconds    Standing on One Leg  Able to lift leg independently and hold 5-10 seconds    Total Score  49    Berg comment:  49/56= moderate risk for falls      Functional Gait  Assessment   Gait assessed   Yes    Gait Level Surface  Walks 20 ft, slow speed, abnormal gait pattern, evidence for imbalance or deviates 10-15 in outside of the 12 in walkway width. Requires more than 7 sec to ambulate 20 ft.   8.62 sec;s   Change in Gait Speed  Able to change speed, demonstrates mild gait deviations, deviates 6-10 in outside of the 12 in walkway width, or no gait deviations, unable to achieve a major change in velocity, or uses a change in velocity, or uses an assistive device.    Gait with Horizontal Head Turns  Performs head turns smoothly with slight change in gait velocity (eg, minor disruption to smooth gait path), deviates 6-10 in outside 12 in walkway width, or uses an assistive device.    Gait with Vertical Head Turns  Performs task with slight change in gait velocity (eg, minor disruption to smooth gait path), deviates 6 - 10 in outside 12 in walkway width or uses assistive device    Gait and Pivot Turn  Pivot turns safely within 3 sec and stops quickly with no loss of balance.    Step Over Obstacle  Is able to step over one shoe  box (4.5 in total height) without changing gait speed. No evidence of imbalance.    Gait with Narrow Base of Support  Ambulates less than 4 steps heel to toe or cannot perform without assistance.    Gait with Eyes Closed  Walks 20 ft, slow speed, abnormal gait pattern, evidence for imbalance, deviates 10-15 in  outside 12 in walkway width. Requires more than 9 sec to ambulate 20 ft.    Ambulating Backwards  Walks 20 ft, slow speed, abnormal gait pattern, evidence for imbalance, deviates 10-15 in outside 12 in walkway width.    Steps  Alternating feet, must use rail.    Total Score  16              PT Short Term Goals - 08/08/19 1500      PT SHORT TERM GOAL #1   Title  Pt will start aquatic PT and be instructed in HEP.    Baseline  08/08/19: will be starting with aquatic PT first week of Feb pending authorization of additional visists    Time  4    Period  Weeks    Status  Partially Met    Target Date  08/19/19        PT Long Term Goals - 08/08/19 2133      PT LONG TERM GOAL #1   Title  Pt will be independent with progressive HEP for strength and balance to continue gains on own.    Baseline  08/08/19: has an HEP, continues to need updating as pt progresses    Time  8    Period  Weeks    Status  On-going      PT LONG TERM GOAL #2   Title  Pt will increase gait speed to >0.84 m/s for improved gait safety in community.    Baseline  08/08/19: 0.83 m/s no AD, improved just not to goal    Time  8    Period  Weeks    Status  On-going      PT LONG TERM GOAL #3   Title  Pt will increase Berg score form 44/56 to >50/56 for improved balance and decreased fall risk.    Baseline  08/08/19: 49/56 scored today, improved by 1 point, not to goal    Period  Weeks    Status  On-going      PT LONG TERM GOAL #4   Title  Pt will ambulate >500' on varied surfaces independently without AD for improved community mobility.    Baseline  08/08/19: working on progressing gait without device with  distance of 350 feet achieved to date    Time  8    Period  Weeks    Status  On-going      PT LONG TERM GOAL #5   Title  Pt will increase FGA from 14 to >19 for improved balance and decreased fall risk.    Baseline  08/08/19: 16/24 scored today, improved just not to goal    Time  8    Period  Weeks    Status  On-going            Plan - 08/08/19 1457    Clinical Impression Statement  Today's skilled session focused on progress toward goals. STG partially met as pt will start aquatic PT in Feb once additional PT visits are authorized. Pt has shown progress toward LTGs as well. The pt is progressing well and should benefit from continued PT to progress toward unmet goals.    Examination-Activity Limitations  Locomotion Level;Stairs;Dressing    Examination-Participation Restrictions  Community Activity    Stability/Clinical Decision Making  Evolving/Moderate complexity    Rehab Potential  Good    PT Frequency  2x / week    PT Duration  8 weeks    PT Treatment/Interventions  ADLs/Self Care  Home Management;DME Instruction;Gait training;Stair training;Functional mobility training;Therapeutic activities;Patient/family education;Neuromuscular re-education;Balance training;Therapeutic exercise;Orthotic Fit/Training;Manual techniques;Passive range of motion;Aquatic Therapy    PT Next Visit Plan  continue to address gait with no AD, LE strengthening and balance reactions.    Consulted and Agree with Plan of Care  Patient       Patient will benefit from skilled therapeutic intervention in order to improve the following deficits and impairments:  Abnormal gait, Decreased activity tolerance, Decreased balance, Difficulty walking, Decreased strength, Impaired sensation, Postural dysfunction, Impaired flexibility, Decreased mobility, Decreased range of motion  Visit Diagnosis: Other abnormalities of gait and mobility  Muscle weakness (generalized)     Problem List Patient Active Problem  List   Diagnosis Date Noted  . 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[place]) 03/07/2019  . Seizures ([place]) 01/31/2019  . Meningioma, recurrent of brain ([place]) 01/31/2019  . Meningioma ([place]) 01/03/2019  . Brain tumor ([place]) 12/27/2018  . Diffuse idiopathic skeletal hyperostosis 06/05/2013    Willow Ora, PTA, [facility] Outpatient Neuro [facility] [address] [place], [place] [address] [phone] 08/09/19, 9:48 PM   Name: MONTERRIO GERST MRN: [id number] Date of Birth: 1961-05-08

## 2019-08-12 ENCOUNTER — Telehealth: Payer: Self-pay | Admitting: Family Medicine

## 2019-08-12 NOTE — Telephone Encounter (Signed)
Will address at office visit

## 2019-08-12 NOTE — Telephone Encounter (Signed)
Patient has televisit set up for Wednesday.

## 2019-08-12 NOTE — Telephone Encounter (Signed)
Patient called requesting for something for his R eye, patient stated they are red and are sticking together. APPT was made for Wednesday 27th. Please fu at your earliest convenience.

## 2019-08-13 ENCOUNTER — Ambulatory Visit (HOSPITAL_COMMUNITY)
Admission: EM | Admit: 2019-08-13 | Discharge: 2019-08-13 | Disposition: A | Discharge status: Home / Self Care | Payer: Medicaid Other | Attending: Family Medicine | Admitting: Family Medicine

## 2019-08-13 ENCOUNTER — Encounter (HOSPITAL_COMMUNITY): Payer: Self-pay | Admitting: Emergency Medicine

## 2019-08-13 ENCOUNTER — Ambulatory Visit: Payer: Medicaid Other | Admitting: Physical Therapy

## 2019-08-13 ENCOUNTER — Other Ambulatory Visit: Payer: Self-pay

## 2019-08-13 DIAGNOSIS — H1033 Unspecified acute conjunctivitis, bilateral: Secondary | ICD-10-CM

## 2019-08-13 MED ORDER — OLOPATADINE HCL 0.1 % OP SOLN: 1.0000 [drp] | Freq: Two times a day (BID) | OPHTHALMIC | 0 refills | Status: DC

## 2019-08-13 NOTE — ED Triage Notes (Signed)
Pt here for bilateral eye redness and drainage

## 2019-08-13 NOTE — ED Provider Notes (Signed)
[facility]   PR:[id number] 08/13/19 Arrival Time: 58  ASSESSMENT & PLAN:  1. Acute conjunctivitis of both eyes, unspecified acute conjunctivitis type     Likely viral. Cannot r/o allergic. Discussed. Expect improvement over the next few days.  Begin: Meds ordered this encounter  Medications  . olopatadine (PATANOL) 0.1 % ophthalmic solution    Sig: Place 1 drop into both eyes 2 (two) times daily.    Dispense:  5 mL    Refill:  0    Discussed the diagnosis and proper care of conjunctivitis.  Stressed household Nurse, mental health. Ophthalmic drops per orders. Warm compress to eye(s). Local eye care discussed.  Reviewed expectations re: course of current medical issues. Questions answered. Outlined signs and symptoms indicating need for more acute intervention. Patient verbalized understanding. After Visit Summary given.   SUBJECTIVE:  Joshua [name] is a 59 y.o. male who presents with complaint of persistent bilateral eye redness, watery drainage, and itching. Onset gradual, approximately 4 days ago. Injury: no. Visual changes: no. Contact lens use: no. Recent illness: no. Self treatment: none reported. No chemical eye exposures. No sick contacts reported.   OBJECTIVE:  Vitals:   08/13/19 1126  BP: 135/86  Pulse: 98  Resp: 18  Temp: 98.3 F (36.8 C)  TempSrc: Oral  SpO2: 96%    General appearance: alert; no distress HEENT: [place]; AT; PERRLA; EOMI OU: without reported pain; with 1+ diffuse conjunctival injection; with watery drainage; without corneal opacities; without limbal flush; without periorbital swelling or erythema Neck: supple without LAD Lungs: speaks full sentences without difficulty; unlabored respirations Heart: regular Skin: warm and dry Psychological: alert and cooperative; normal mood and affect     No Known Allergies  Past Medical History:  Diagnosis Date  . Brain tumor ([place]) 02/20/2013   brain tumor removed in March 2014, Dr Donald Pore   . Dizziness   . Enlarged prostate   . Headache(784.0)    scattered  . Meningioma ([place])   . Seizures ([place])    Social History   Socioeconomic History  . Marital status: Married    Spouse name: Not on file  . Number of children: Not on file  . Years of education: Not on file  . Highest education level: Not on file  Occupational History  . Not on file  Tobacco Use  . Smoking status: Former Smoker    Types: Cigarettes    Quit date: 01/29/2019    Years since quitting: 0.5  . Smokeless tobacco: Never Used  . Tobacco comment: hasn't smoked in a week  Substance and Sexual Activity  . Alcohol use: Yes    Alcohol/week: 8.0 standard drinks    Types: 6 Cans of beer, 2 Shots of liquor per week    Comment: occasionally  . Drug use: No  . Sexual activity: Yes  Other Topics Concern  . Not on file  Social History Narrative  . Not on file   Social Determinants of Health   Financial Resource Strain:   . Difficulty of Paying Living Expenses: Not on file  Food Insecurity:   . Worried About Charity fundraiser in the Last Year: Not on file  . Ran Out of Food in the Last Year: Not on file  Transportation Needs: No Transportation Needs  . Lack of Transportation (Medical): No  . Lack of Transportation (Non-Medical): No  Physical Activity:   . Days of Exercise per Week: Not on file  . Minutes of Exercise per Session: Not on  file  Stress:   . Feeling of Stress : Not on file  Social Connections:   . Frequency of Communication with Friends and Family: Not on file  . Frequency of Social Gatherings with Friends and Family: Not on file  . Attends Religious Services: Not on file  . Active Member of Clubs or Organizations: Not on file  . Attends Archivist Meetings: Not on file  . Marital Status: Not on file  Intimate Partner Violence:   . Fear of Current or Ex-Partner: Not on file  . Emotionally Abused: Not on file  . Physically Abused: Not on file  . Sexually Abused: Not on  file   Family History  Problem Relation Age of Onset  . Hypertension Mother   . Hypertension Father    Past Surgical History:  Procedure Laterality Date  . APPLICATION OF CRANIAL NAVIGATION N/A 01/10/2019   Procedure: APPLICATION OF CRANIAL NAVIGATION;  Surgeon: Erline Levine, MD;  Location: [facility];  Service: Neurosurgery;  Laterality: N/A;  . CATARACT EXTRACTION Right   . CRANIOTOMY Right 11/06/2012   Procedure: CRANIOTOMY TUMOR EXCISION;  Surgeon: Erline Levine, MD;  Location: [facility];  Service: Neurosurgery;  Laterality: Right;  Right Parasagittal craniotomy for meningioma with Stealth  . CRANIOTOMY Right 01/10/2019   Procedure: Right Parasagittal Craniotomy for Tumor;  Surgeon: Erline Levine, MD;  Location: [facility];  Service: Neurosurgery;  Laterality: Right;  Right parasagittal craniotomy for tumor  . ESOPHAGOGASTRODUODENOSCOPY    . right knee arthroscopy       Vanessa Kick, MD 08/13/19 1200

## 2019-08-14 ENCOUNTER — Ambulatory Visit: Payer: Medicaid Other | Admitting: Family Medicine

## 2019-08-14 ENCOUNTER — Other Ambulatory Visit: Payer: Self-pay | Admitting: Radiation Therapy

## 2019-08-16 ENCOUNTER — Ambulatory Visit: Payer: Medicaid Other

## 2019-08-16 ENCOUNTER — Other Ambulatory Visit: Payer: Self-pay

## 2019-08-16 DIAGNOSIS — M6281 Muscle weakness (generalized): Secondary | ICD-10-CM | POA: Diagnosis not present

## 2019-08-16 DIAGNOSIS — R2689 Other abnormalities of gait and mobility: Secondary | ICD-10-CM

## 2019-08-16 NOTE — Therapy (Signed)
[facility] [address] [place], [place], [address] Phone: [phone]   Fax:  [phone]  Physical Therapy Treatment  Patient Details  Name: Joshua [name] MRN: [id number] Date of Birth: [birth year] Referring Provider (PT): Cecil Cobbs   Encounter Date: 08/16/2019  PT End of Session - 08/16/19 0811    Visit Number  12    Number of Visits  12    Date for PT Re-Evaluation  10/13/19    Authorization Type  Medicaid 3 visits 06/03/11/02/2019, 12/10-12/30/20 6 units, 1/7-1/27/2021 3 visits, 1/29-3/4 10 visits    Authorization - Visit Number  1    Authorization - Number of Visits  10    PT Start Time  0804    PT Stop Time  0845    PT Time Calculation (min)  41 min    Equipment Utilized During Treatment  Gait belt    Activity Tolerance  Patient tolerated treatment well;No increased pain    Behavior During Therapy  WFL for tasks assessed/performed       Past Medical History:  Diagnosis Date  . Brain tumor ([place]) 02/20/2013   brain tumor removed in March 2014, Dr Donald Pore  . Dizziness   . Enlarged prostate   . Headache(784.0)    scattered  . Meningioma ([place])   . Seizures ([place])     Past Surgical History:  Procedure Laterality Date  . APPLICATION OF CRANIAL NAVIGATION N/A 01/10/2019   Procedure: APPLICATION OF CRANIAL NAVIGATION;  Surgeon: Erline Levine, MD;  Location: [facility];  Service: Neurosurgery;  Laterality: N/A;  . CATARACT EXTRACTION Right   . CRANIOTOMY Right 11/06/2012   Procedure: CRANIOTOMY TUMOR EXCISION;  Surgeon: Erline Levine, MD;  Location: [facility];  Service: Neurosurgery;  Laterality: Right;  Right Parasagittal craniotomy for meningioma with Stealth  . CRANIOTOMY Right 01/10/2019   Procedure: Right Parasagittal Craniotomy for Tumor;  Surgeon: Erline Levine, MD;  Location: [facility];  Service: Neurosurgery;  Laterality: Right;  Right parasagittal craniotomy for tumor  . ESOPHAGOGASTRODUODENOSCOPY    .  right knee arthroscopy      There were no vitals filed for this visit.  Subjective Assessment - 08/16/19 0808    Subjective  Pt went to ER due to eye issues on 1/26. Was given drops and has been improving. No redness noted today. Pt is now out of furosemide which was helping with swelling. Is going to get with his PCP about if needs to have more.    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 History of right THA on 2019.    Patient Stated Goals  Pt would like to be able to elimate his cane use.    Currently in Pain?  Yes    Pain Score  4     Pain Location  Hip    Pain Orientation  Right    Pain Descriptors / Indicators  Aching    Pain Type  Chronic pain    Pain Onset  More than a month ago                       [facility] Adult PT Treatment/Exercise - 08/16/19 0813      Ambulation/Gait   Ambulation/Gait  Yes    Ambulation/Gait Assistance  5: Supervision    Ambulation/Gait Assistance Details  Pt was instructed to try to increase left step length and  foot clearance.    Ambulation Distance (Feet)  345 Feet    Assistive device  None    Gait Pattern  Decreased step length - right;Decreased step length - left;Step-through pattern;Decreased dorsiflexion - left    Ambulation Surface  Level;Indoor    Stairs  Yes    Stairs Assistance  5: Supervision    Stairs Assistance Details (indicate cue type and reason)  Pt was cued to try to shift weight over left leg more     Stair Management Technique  One rail Right;Alternating pattern;Step to pattern    Number of Stairs  16    Gait Comments  Pt performed step-to pattern initially on 8 steps then after practice with stepping up and weight shifting on left performed reciprocal pattern the last 8.      Neuro Re-ed    Neuro Re-ed Details   In // bars: marching gait with 3# ankle weight on left LE x 2  laps. Pt was having more issues with hip flexion and more supination at foot noted. Removed weight and then repeated x 2 laps with better left foot clearance, side stepping x 2 laps, backwards gait x 2 laps with cues to increased step length.       Exercises   Exercises  Other Exercises    Other Exercises   Step-ups on 6" step with left leg with right rail x 10 with PT helping facilitate weight shift then x 10 with only SPC support.  Standing left hamstring curl x 10 with 3# ankle weight left then hip ext x 15 left with verbal cues for form.               PT Short Term Goals - 08/08/19 1500      PT SHORT TERM GOAL #1   Title  Pt will start aquatic PT and be instructed in HEP.    Baseline  08/08/19: will be starting with aquatic PT first week of Feb pending authorization of additional visists    Time  4    Period  Weeks    Status  Partially Met    Target Date  08/19/19        PT Long Term Goals - 08/08/19 2133      PT LONG TERM GOAL #1   Title  Pt will be independent with progressive HEP for strength and balance to continue gains on own.    Baseline  08/08/19: has an HEP, continues to need updating as pt progresses    Time  8    Period  Weeks    Status  On-going      PT LONG TERM GOAL #2   Title  Pt will increase gait speed to >0.84 m/s for improved gait safety in community.    Baseline  08/08/19: 0.83 m/s no AD, improved just not to goal    Time  8    Period  Weeks    Status  On-going      PT LONG TERM GOAL #3   Title  Pt will increase Berg score form 44/56 to >50/56 for improved balance and decreased fall risk.    Baseline  08/08/19: 49/56 scored today, improved by 1 point, not to goal    Period  Weeks    Status  On-going      PT LONG TERM GOAL #4   Title  Pt will ambulate >500' on varied surfaces independently without AD for improved community mobility.    Baseline  08/08/19:  working on progressing gait without device with distance of 350 feet achieved to date     Time  8    Period  Weeks    Status  On-going      PT LONG TERM GOAL #5   Title  Pt will increase FGA from 14 to >19 for improved balance and decreased fall risk.    Baseline  08/08/19: 16/24 scored today, improved just not to goal    Time  8    Period  Weeks    Status  On-going            Plan - 08/16/19 1056    Clinical Impression Statement  Pt was initially not shifting weight well over left leg on steps taking very quick right step. After practice patient able to show much smoother reciprocal pattern on step with better left weight shift. Pt continued to benefit from skilled PT to address strength, balance and gait deficits.    Examination-Activity Limitations  Locomotion Level;Stairs;Dressing    Examination-Participation Restrictions  Community Activity    Stability/Clinical Decision Making  Evolving/Moderate complexity    Rehab Potential  Good    PT Frequency  2x / week    PT Duration  8 weeks    PT Treatment/Interventions  ADLs/Self Care Home Management;DME Instruction;Gait training;Stair training;Functional mobility training;Therapeutic activities;Patient/family education;Neuromuscular re-education;Balance training;Therapeutic exercise;Orthotic Fit/Training;Manual techniques;Passive range of motion;Aquatic Therapy    PT Next Visit Plan  continue to address gait with no AD, LE strengthening and balance reactions. Continue to work on improving left weight shift.    Consulted and Agree with Plan of Care  Patient       Patient will benefit from skilled therapeutic intervention in order to improve the following deficits and impairments:  Abnormal gait, Decreased activity tolerance, Decreased balance, Difficulty walking, Decreased strength, Impaired sensation, Postural dysfunction, Impaired flexibility, Decreased mobility, Decreased range of motion  Visit Diagnosis: Other abnormalities of gait and mobility  Muscle weakness (generalized)     Problem List Patient Active  Problem List   Diagnosis Date Noted  . 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[place]) 03/07/2019  . Seizures ([place]) 01/31/2019  . Meningioma, recurrent of brain ([place]) 01/31/2019  . Meningioma ([place]) 01/03/2019  . Brain tumor ([place]) 12/27/2018  . Diffuse idiopathic skeletal hyperostosis 06/05/2013    Electa Sniff, PT, DPT, NCS 08/16/2019, 10:59 AM  [facility] [address] [place], [place], [address] Phone: [phone]   Fax:  [phone]  Name: Joshua [name] MRN: [id number] Date of Birth: 1961-05-10

## 2019-08-19 ENCOUNTER — Ambulatory Visit: Payer: Medicaid Other | Attending: Internal Medicine

## 2019-08-19 ENCOUNTER — Other Ambulatory Visit: Payer: Self-pay

## 2019-08-19 VITALS — BP 138/82

## 2019-08-19 DIAGNOSIS — R2689 Other abnormalities of gait and mobility: Secondary | ICD-10-CM | POA: Insufficient documentation

## 2019-08-19 DIAGNOSIS — M21372 Foot drop, left foot: Secondary | ICD-10-CM | POA: Diagnosis not present

## 2019-08-19 DIAGNOSIS — M6281 Muscle weakness (generalized): Secondary | ICD-10-CM | POA: Diagnosis not present

## 2019-08-19 NOTE — Therapy (Signed)
[facility] [address] [place], [place], [address] Phone: [phone]   Fax:  [phone]  Physical Therapy Treatment  Patient Details  Name: Joshua [name] MRN: [id number] Date of Birth: [birth year] Referring Provider (PT): Cecil Cobbs   Encounter Date: 08/19/2019  PT End of Session - 08/19/19 0716    Visit Number  5    Number of Visits  16    Date for PT Re-Evaluation  10/13/19    Authorization Type  Medicaid 3 visits 06/03/11/02/2019, 12/10-12/30/20 6 units, 1/7-1/27/2021 3 visits, 1/29-3/4 10 visits    Authorization - Visit Number  2    Authorization - Number of Visits  10    PT Start Time  0715    PT Stop Time  0626    PT Time Calculation (min)  40 min    Equipment Utilized During Treatment  Gait belt    Activity Tolerance  Patient tolerated treatment well;No increased pain    Behavior During Therapy  WFL for tasks assessed/performed       Past Medical History:  Diagnosis Date  . Brain tumor ([facility]) 02/20/2013   brain tumor removed in March 2014, Dr Donald Pore  . Dizziness   . Enlarged prostate   . Headache(784.0)    scattered  . Meningioma ([place])   . Seizures ([place])     Past Surgical History:  Procedure Laterality Date  . APPLICATION OF CRANIAL NAVIGATION N/A 01/10/2019   Procedure: APPLICATION OF CRANIAL NAVIGATION;  Surgeon: Erline Levine, MD;  Location: [facility];  Service: Neurosurgery;  Laterality: N/A;  . CATARACT EXTRACTION Right   . CRANIOTOMY Right 11/06/2012   Procedure: CRANIOTOMY TUMOR EXCISION;  Surgeon: Erline Levine, MD;  Location: [facility];  Service: Neurosurgery;  Laterality: Right;  Right Parasagittal craniotomy for meningioma with Stealth  . CRANIOTOMY Right 01/10/2019   Procedure: Right Parasagittal Craniotomy for Tumor;  Surgeon: Erline Levine, MD;  Location: [facility];  Service: Neurosurgery;  Laterality: Right;  Right parasagittal craniotomy for tumor  . ESOPHAGOGASTRODUODENOSCOPY    .  right knee arthroscopy      Vitals:   08/19/19 0721  BP: 138/82    Subjective Assessment - 08/19/19 0718    Subjective  Pt reports he did get new script for the lasix. Reports he has some strange feeling in right thigh that is intermittent. Pt is going to get AFO today.    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 History of right THA on 2019.    Patient Stated Goals  Pt would like to be able to elimate his cane use.    Currently in Pain?  Yes    Pain Location  Wrist    Pain Orientation  Right    Pain Descriptors / Indicators  Sore    Pain Onset  More than a month ago                       [facility] Adult PT Treatment/Exercise - 08/19/19 0719      Ambulation/Gait   Ambulation/Gait  Yes    Gait Comments  Pt ambulated 8 min at 1.55mh with verbal cues to increase left step length and heel strike and stay upright towards front of treadmill. BP=148/90 after.      Neuro Re-ed    Neuro Re-ed Details   Along counter: tandem gait with occasional UE support 6'  x 4 CGA with decreased stability at times, standing on airex with feet together x 30 sec eyes open and x 30 sec eyes closed with increased sway eyes closed, standing on 2x4 30 sec x 2. Pt had difficulty using ankle strategy with posterior LOB multiple times. Did improve some as went on. Marching gait with 1 UE support 6' x 2. Pt's legs fatigued after and needed to rest with decreased SLS stability.       Exercises   Exercises  Other Exercises    Other Exercises   Standing left hamstring curl 10 x 2 with 3# ankle weight on left then hip extension 10 x 2 with 3# ankle weight.              PT Education - 08/19/19 0848    Education Details  Pt to continue with current HEP    Person(s) Educated  Patient    Methods  Explanation    Comprehension  Verbalized understanding        PT Short Term Goals - 08/08/19 1500      PT SHORT TERM GOAL #1   Title  Pt will start aquatic PT and be instructed in HEP.    Baseline  08/08/19: will be starting with aquatic PT first week of Feb pending authorization of additional visists    Time  4    Period  Weeks    Status  Partially Met    Target Date  08/19/19        PT Long Term Goals - 08/08/19 2133      PT LONG TERM GOAL #1   Title  Pt will be independent with progressive HEP for strength and balance to continue gains on own.    Baseline  08/08/19: has an HEP, continues to need updating as pt progresses    Time  8    Period  Weeks    Status  On-going      PT LONG TERM GOAL #2   Title  Pt will increase gait speed to >0.84 m/s for improved gait safety in community.    Baseline  08/08/19: 0.83 m/s no AD, improved just not to goal    Time  8    Period  Weeks    Status  On-going      PT LONG TERM GOAL #3   Title  Pt will increase Berg score form 44/56 to >50/56 for improved balance and decreased fall risk.    Baseline  08/08/19: 49/56 scored today, improved by 1 point, not to goal    Period  Weeks    Status  On-going      PT LONG TERM GOAL #4   Title  Pt will ambulate >500' on varied surfaces independently without AD for improved community mobility.    Baseline  08/08/19: working on progressing gait without device with distance of 350 feet achieved to date    Time  8    Period  Weeks    Status  On-going      PT LONG TERM GOAL #5   Title  Pt will increase FGA from 14 to >19 for improved balance and decreased fall risk.    Baseline  08/08/19: 16/24 scored today, improved just not to goal    Time  8    Period  Weeks    Status  On-going            Plan - 08/19/19 0848    Clinical Impression Statement  Pt  needed cuing for increased step length and heel strike on treadmill. Legs were more fatigued at end of session.    Examination-Activity Limitations  Locomotion Level;Stairs;Dressing     Examination-Participation Restrictions  Community Activity    Stability/Clinical Decision Making  Evolving/Moderate complexity    Rehab Potential  Good    PT Frequency  2x / week    PT Duration  8 weeks    PT Treatment/Interventions  ADLs/Self Care Home Management;DME Instruction;Gait training;Stair training;Functional mobility training;Therapeutic activities;Patient/family education;Neuromuscular re-education;Balance training;Therapeutic exercise;Orthotic Fit/Training;Manual techniques;Passive range of motion;Aquatic Therapy    PT Next Visit Plan  If patient did get AFO work on gait with it, continue to address gait with no AD, LE strengthening and balance reactions. Continue to work on improving left weight shift.    Consulted and Agree with Plan of Care  Patient       Patient will benefit from skilled therapeutic intervention in order to improve the following deficits and impairments:  Abnormal gait, Decreased activity tolerance, Decreased balance, Difficulty walking, Decreased strength, Impaired sensation, Postural dysfunction, Impaired flexibility, Decreased mobility, Decreased range of motion  Visit Diagnosis: Other abnormalities of gait and mobility  Muscle weakness (generalized)     Problem List Patient Active Problem List   Diagnosis Date Noted  . 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[place]) 03/07/2019  . Seizures ([place]) 01/31/2019  . Meningioma, recurrent of brain ([place]) 01/31/2019  . Meningioma ([place]) 01/03/2019  . Brain tumor ([place]) 12/27/2018  . Diffuse idiopathic skeletal hyperostosis 06/05/2013    Electa Sniff, PT, DPT, NCS 08/19/2019, 8:49 AM  [facility] [address] [place], [place], [address] Phone: [phone]   Fax:  [phone]  Name: Joshua [name] MRN: [id number] Date of Birth: October 27, 1960

## 2019-08-20 ENCOUNTER — Telehealth: Payer: Self-pay | Admitting: Emergency Medicine

## 2019-08-20 ENCOUNTER — Other Ambulatory Visit: Payer: Self-pay | Admitting: *Deleted

## 2019-08-20 ENCOUNTER — Telehealth: Payer: Self-pay | Admitting: *Deleted

## 2019-08-20 MED ORDER — CLONAZEPAM 0.5 MG PO TABS: 0.5000 mg | ORAL_TABLET | Freq: Two times a day (BID) | ORAL | 1 refills | Status: DC

## 2019-08-20 NOTE — Telephone Encounter (Signed)
[facility] called to say he needs a refill on Clonazepam. Has been taking it BID and has used it as needed for breakthrough seizures. Explained that Dr Mickeal Skinner wants him to take it BID ONLY. Is to use Lorazepam for the breakthrough seizures. Pt states that he still has the 12 tablets of lorazepam. Verbalized understanding to take Clonazepam BID and use Lorazepam as needed for any breakthrough seizures.   Pharmacy called to refill Clonazepam as ordered by Dr Mickeal Skinner. They submitted an override for early refill. RN explained why patient ran out early

## 2019-08-20 NOTE — Telephone Encounter (Signed)
Called patient back to discuss medication mgmt.   Explained that he is to take the Clonazepam 2 times per day only. If he has breakthrough seizures, he is supposed to take Lorazepam 1 mg tablet.   He states he still has the 12 tablets of lorazepam, has not been taking them. Verbalized understanding that he will take clonazepam BID and use lorazepam if needed for breakthrough seizures.

## 2019-08-20 NOTE — Addendum Note (Signed)
Addended by: [name] on: 08/20/2019 10:39 AM   Modules accepted: Orders

## 2019-08-20 NOTE — Telephone Encounter (Signed)
Pt's spouse called requesting refill of klonopin.  States she left a VM just a few minutes ago on [employer] RN line but since the message stated that calls would be returned w/in 24 hours and to f/u with triage for more urgent matters she contacted triage as well.  States pt is out of his medication and that it prevents seizures, and that per pharmacy it is too soon to refill.  Spoke with RN Tammi who is covering [employer], states she is working on the refill now.  Pt's spouse made aware, denies any further questions/concerns at this time.

## 2019-08-20 NOTE — Telephone Encounter (Signed)
Received call stating Joshua [name] will be out of clonazepam after tonight's dose. She called [facility] but they state it is too early for a refill.  He takes it BID AND as needed for seizures.

## 2019-08-22 ENCOUNTER — Ambulatory Visit: Payer: Medicaid Other | Attending: Family Medicine | Admitting: Physician Assistant

## 2019-08-22 ENCOUNTER — Other Ambulatory Visit: Payer: Self-pay

## 2019-08-22 DIAGNOSIS — J069 Acute upper respiratory infection, unspecified: Secondary | ICD-10-CM

## 2019-08-22 DIAGNOSIS — R609 Edema, unspecified: Secondary | ICD-10-CM

## 2019-08-22 MED ORDER — AZITHROMYCIN 250 MG PO TABS: ORAL_TABLET | ORAL | 0 refills | Status: DC

## 2019-08-22 MED ORDER — FUROSEMIDE 20 MG PO TABS: 20.0000 mg | ORAL_TABLET | Freq: Every day | ORAL | 1 refills | Status: DC

## 2019-08-22 MED ORDER — BENZONATATE 100 MG PO CAPS: 200.0000 mg | ORAL_CAPSULE | Freq: Two times a day (BID) | ORAL | 0 refills | Status: DC | PRN

## 2019-08-22 NOTE — Progress Notes (Signed)
Virtual Visit via Telephone Note  I connected with [name] on 08/22/19 at  1:50 PM EST by telephone and verified that I am speaking with the correct person using two identifiers.   I discussed the limitations, risks, security and privacy concerns of performing an evaluation and management service by telephone and the availability of in person appointments. I also discussed with the patient that there may be a patient responsible charge related to this service. The patient expressed understanding and agreed to proceed.  PATIENT visit by telephone virtually in the context of Covid-19 pandemic. Patient location:  home My Location:  [place] office Persons on the call:  Me and the patient  History of Present Illness:   Cough productive of green phlegm for over 2 weeks now.  Worse in the mornings and occurs throughout the day.  No fever.  Throat has felt dry and scratchy.  No sinus pain/pressure.   Also has occasional swelling of lower extremity and was given lasix at [facility] which helped.  He denies any CP/SOB.  Recently started using a leg brace.  No erythema or swelling of calves    Observations/Objective:  NAD.  A&Ox3   Assessment and Plan: 1. Upper respiratory tract infection, unspecified type -will cover for atypicals - azithromycin (ZITHROMAX) 250 MG tablet; Take 2 today then 1 daily  Dispense: 6 tablet; Refill: 0 - benzonatate (TESSALON) 100 MG capsule; Take 2 capsules (200 mg total) by mouth 2 (two) times daily as needed for cough.  Dispense: 40 capsule; Refill: 0  2. Edema, unspecified type - furosemide (LASIX) 20 MG tablet; Take 1 tablet (20 mg total) by mouth daily. Prn swelling.  Use sparingly  Dispense: 30 tablet; Refill: 1 To ED if redness or swelling develop/call 911 if SOB/CP.  He has absolutely none of these now.    Follow Up Instructions: See PCP 1-2 months;  Sooner if needed   I discussed the assessment and treatment plan with the patient. The patient was provided an  opportunity to ask questions and all were answered. The patient agreed with the plan and demonstrated an understanding of the instructions.   The patient was advised to call back or seek an in-person evaluation if the symptoms worsen or if the condition fails to improve as anticipated.  I provided 12 minutes of non-face-to-face time during this encounter.   Freeman Caldron, PA-C  Patient ID: JERMANIE CARNAL, male   DOB: 03/05/1961, 59 y.o.   MRN: [id number]

## 2019-08-23 ENCOUNTER — Ambulatory Visit: Payer: Medicaid Other

## 2019-08-26 ENCOUNTER — Ambulatory Visit: Payer: Medicaid Other

## 2019-08-26 ENCOUNTER — Ambulatory Visit: Payer: Self-pay | Admitting: Physical Therapy

## 2019-08-29 ENCOUNTER — Telehealth: Payer: Self-pay | Admitting: *Deleted

## 2019-08-29 NOTE — Telephone Encounter (Signed)
Received form from [facility] needing clearance to extract teeth #14, 15, 16, and 17 under general anesthesia using (versed, Fentanyl and Propofol).  Dr. Mickeal Skinner advised  "treatment may proceed" and form returned via fax to [phone]

## 2019-08-30 ENCOUNTER — Other Ambulatory Visit: Payer: Self-pay

## 2019-08-30 ENCOUNTER — Ambulatory Visit: Payer: Medicaid Other

## 2019-08-30 ENCOUNTER — Ambulatory Visit (HOSPITAL_COMMUNITY): Payer: Medicaid Other

## 2019-08-30 ENCOUNTER — Ambulatory Visit (HOSPITAL_COMMUNITY)
Admission: RE | Admit: 2019-08-30 | Discharge: 2019-08-30 | Disposition: A | Discharge status: Home / Self Care | Payer: Medicaid Other | Source: Ambulatory Visit | Attending: Internal Medicine | Admitting: Internal Medicine

## 2019-08-30 DIAGNOSIS — M6281 Muscle weakness (generalized): Secondary | ICD-10-CM

## 2019-08-30 DIAGNOSIS — D320 Benign neoplasm of cerebral meninges: Secondary | ICD-10-CM | POA: Insufficient documentation

## 2019-08-30 DIAGNOSIS — R2689 Other abnormalities of gait and mobility: Secondary | ICD-10-CM

## 2019-08-30 DIAGNOSIS — D329 Benign neoplasm of meninges, unspecified: Secondary | ICD-10-CM | POA: Diagnosis not present

## 2019-08-30 MED ORDER — GADOBUTROL 1 MMOL/ML IV SOLN
9.0000 mL | Freq: Once | INTRAVENOUS | Status: AC | PRN
  Administered 2019-08-30: 9 mL via INTRAVENOUS

## 2019-08-30 NOTE — Therapy (Signed)
[facility] [address] [place], [place], [address] Phone: [phone]   Fax:  [phone]  Physical Therapy Treatment  Patient Details  Name: Joshua [name] MRN: [id number] Date of Birth: 11/13/1960 Referring Provider (PT): Cecil Cobbs   Encounter Date: 08/30/2019  PT End of Session - 08/30/19 0719    Visit Number  6    Number of Visits  16    Date for PT Re-Evaluation  10/13/19    Authorization Type  Medicaid 3 visits 06/03/11/02/2019, 12/10-12/30/20 6 units, 1/7-1/27/2021 3 visits, 1/29-3/4 10 visits    Authorization - Visit Number  3    Authorization - Number of Visits  10    PT Start Time  0717    PT Stop Time  0802    PT Time Calculation (min)  45 min    Equipment Utilized During Treatment  Gait belt    Activity Tolerance  Patient tolerated treatment well;No increased pain    Behavior During Therapy  WFL for tasks assessed/performed       Past Medical History:  Diagnosis Date  . Brain tumor ([place]) 02/20/2013   brain tumor removed in March 2014, Dr Donald Pore  . Dizziness   . Enlarged prostate   . Headache(784.0)    scattered  . Meningioma ([place])   . Seizures ([place])     Past Surgical History:  Procedure Laterality Date  . APPLICATION OF CRANIAL NAVIGATION N/A 01/10/2019   Procedure: APPLICATION OF CRANIAL NAVIGATION;  Surgeon: Erline Levine, MD;  Location: [facility];  Service: Neurosurgery;  Laterality: N/A;  . CATARACT EXTRACTION Right   . CRANIOTOMY Right 11/06/2012   Procedure: CRANIOTOMY TUMOR EXCISION;  Surgeon: Erline Levine, MD;  Location: [facility];  Service: Neurosurgery;  Laterality: Right;  Right Parasagittal craniotomy for meningioma with Stealth  . CRANIOTOMY Right 01/10/2019   Procedure: Right Parasagittal Craniotomy for Tumor;  Surgeon: Erline Levine, MD;  Location: [facility];  Service: Neurosurgery;  Laterality: Right;  Right parasagittal craniotomy for tumor  . ESOPHAGOGASTRODUODENOSCOPY    .  right knee arthroscopy      There were no vitals filed for this visit.  Subjective Assessment - 08/30/19 0719    Subjective  Pt received his AFO. Reports he has tried it a little but feels like steps feel awkward.    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 History of right THA on 2019.    Patient Stated Goals  Pt would like to be able to elimate his cane use.    Currently in Pain?  No/denies    Pain Onset  More than a month ago                       [facility] Adult PT Treatment/Exercise - 08/30/19 0720      Ambulation/Gait   Ambulation/Gait  Yes    Ambulation/Gait Assistance  5: Supervision    Ambulation/Gait Assistance Details  Pt was given verbal cues to increase left hip flexion with gait.    Ambulation Distance (Feet)  460 Feet    Assistive device  None   left AFO   Gait Pattern  Step-through pattern;Decreased weight shift to left;Decreased step length - right;Decreased step length - left;Decreased hip/knee flexion - left;Decreased dorsiflexion - left   noted some mild clonus on left at tone off   Ambulation Surface  Level;Indoor  Gait Comments  left AFO is walkon reaction ottobock with medial strut      Therapeutic Activites    Therapeutic Activities  Other Therapeutic Activities    Other Therapeutic Activities  Pt was educated on how to increase wear time with AFO starting with 2 hours on, 1 hour off then increasing next week to 3 hours on 1 hour off and so on. To check skin when he takes off. Only needs on if up walking more.      Neuro Re-ed    Neuro Re-ed Details   At bottom of steps: tapping 2nd step with left leg with focus on left hip flexion x 10, tapping 2nd step with right leg with emphasis on stabilizing at left leg with cues to tighten gluts. Tapping right foot on bottom step x 10 without UE support with tactile  cues at hips. In // bars: marching gait with visual and verbal cues to increase left stance time 6' x 6.      Exercises   Exercises  Other Exercises    Other Exercises   Standing at bottom of steps: left hip flexion x 10 with 3# ankle weight, step-ups on bottom of step with left foot x 10 with cues to weight shift over and tighten gluts, left hamstring curl 10 x 2 with 3# weight, left hip ext 10 x 2 with 3# weight.              PT Education - 08/30/19 1037    Education Details  Pt to continue with current HEP. Reports he purchased 2.5# ankle weight to add in. Educated on AFO wearing time.    Person(s) Educated  Patient    Methods  Explanation    Comprehension  Verbalized understanding       PT Short Term Goals - 08/30/19 1039      PT SHORT TERM GOAL #1   Title  Pt will start aquatic PT and be instructed in HEP.    Baseline  Pt has decided to hold off on aquatic PT due to Covid concerns    Time  4    Period  Weeks    Status  Deferred    Target Date  08/19/19        PT Long Term Goals - 08/08/19 2133      PT LONG TERM GOAL #1   Title  Pt will be independent with progressive HEP for strength and balance to continue gains on own.    Baseline  08/08/19: has an HEP, continues to need updating as pt progresses    Time  8    Period  Weeks    Status  On-going      PT LONG TERM GOAL #2   Title  Pt will increase gait speed to >0.84 m/s for improved gait safety in community.    Baseline  08/08/19: 0.83 m/s no AD, improved just not to goal    Time  8    Period  Weeks    Status  On-going      PT LONG TERM GOAL #3   Title  Pt will increase Berg score form 44/56 to >50/56 for improved balance and decreased fall risk.    Baseline  08/08/19: 49/56 scored today, improved by 1 point, not to goal    Period  Weeks    Status  On-going      PT LONG TERM GOAL #4   Title  Pt will ambulate >500' on varied surfaces  independently without AD for improved community mobility.    Baseline   08/08/19: working on progressing gait without device with distance of 350 feet achieved to date    Time  8    Period  Weeks    Status  On-going      PT LONG TERM GOAL #5   Title  Pt will increase FGA from 14 to >19 for improved balance and decreased fall risk.    Baseline  08/08/19: 16/24 scored today, improved just not to goal    Time  8    Period  Weeks    Status  On-going            Plan - 08/30/19 1040    Clinical Impression Statement  Session focused on gait training with new left ottobock AFO. Pt was able to get longer right step length with brace but still has decreased stance time on left. Focused in improving hip/knee flexion with swing phase to help but does have tone that kicks in with gait.    Examination-Activity Limitations  Locomotion Level;Stairs;Dressing    Examination-Participation Restrictions  Community Activity    Stability/Clinical Decision Making  Evolving/Moderate complexity    Rehab Potential  Good    PT Frequency  2x / week    PT Duration  8 weeks    PT Treatment/Interventions  ADLs/Self Care Home Management;DME Instruction;Gait training;Stair training;Functional mobility training;Therapeutic activities;Patient/family education;Neuromuscular re-education;Balance training;Therapeutic exercise;Orthotic Fit/Training;Manual techniques;Passive range of motion;Aquatic Therapy    PT Next Visit Plan  Gait training with new AFO, LE strengthening and balance reactions. Continue to work on improving left weight shift. Focus on left hip/knee flexion strengthening.    Consulted and Agree with Plan of Care  Patient       Patient will benefit from skilled therapeutic intervention in order to improve the following deficits and impairments:  Abnormal gait, Decreased activity tolerance, Decreased balance, Difficulty walking, Decreased strength, Impaired sensation, Postural dysfunction, Impaired flexibility, Decreased mobility, Decreased range of motion  Visit  Diagnosis: Other abnormalities of gait and mobility  Muscle weakness (generalized)     Problem List Patient Active Problem List   Diagnosis Date Noted  . 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[place]) 03/07/2019  . Seizures ([place]) 01/31/2019  . Meningioma, recurrent of brain ([place]) 01/31/2019  . Meningioma ([place]) 01/03/2019  . Brain tumor ([place]) 12/27/2018  . Diffuse idiopathic skeletal hyperostosis 06/05/2013    Electa Sniff, PT, DPT, NCS 08/30/2019, 10:43 AM  [facility] [address] [place], [place], [address] Phone: [phone]   Fax:  [phone]  Name: Joshua [name] MRN: [id number] Date of Birth: [birth year]

## 2019-09-02 ENCOUNTER — Other Ambulatory Visit: Payer: Self-pay

## 2019-09-02 ENCOUNTER — Inpatient Hospital Stay: Payer: Medicaid Other | Attending: Internal Medicine | Admitting: Internal Medicine

## 2019-09-02 ENCOUNTER — Ambulatory Visit: Payer: Medicaid Other

## 2019-09-02 VITALS — BP 130/79 | HR 92 | Temp 99.2°F | Resp 19 | Ht 69.0 in | Wt 208.5 lb

## 2019-09-02 DIAGNOSIS — C700 Malignant neoplasm of cerebral meninges: Secondary | ICD-10-CM | POA: Diagnosis not present

## 2019-09-02 DIAGNOSIS — R569 Unspecified convulsions: Secondary | ICD-10-CM

## 2019-09-02 DIAGNOSIS — Z87891 Personal history of nicotine dependence: Secondary | ICD-10-CM | POA: Insufficient documentation

## 2019-09-02 DIAGNOSIS — G4089 Other seizures: Secondary | ICD-10-CM | POA: Insufficient documentation

## 2019-09-02 DIAGNOSIS — M6281 Muscle weakness (generalized): Secondary | ICD-10-CM

## 2019-09-02 DIAGNOSIS — D320 Benign neoplasm of cerebral meninges: Secondary | ICD-10-CM | POA: Insufficient documentation

## 2019-09-02 DIAGNOSIS — Z79899 Other long term (current) drug therapy: Secondary | ICD-10-CM | POA: Diagnosis not present

## 2019-09-02 DIAGNOSIS — R2689 Other abnormalities of gait and mobility: Secondary | ICD-10-CM | POA: Diagnosis not present

## 2019-09-02 MED ORDER — LEVETIRACETAM 500 MG PO TABS: ORAL_TABLET | ORAL | 1 refills | Status: DC

## 2019-09-02 NOTE — Therapy (Signed)
[facility] [address] [place], [place], [address] Phone: [phone]   Fax:  [phone]  Physical Therapy Treatment  Patient Details  Name: Joshua [name] MRN: [id number] Date of Birth: [birth year] Referring Provider (PT): Cecil Cobbs   Encounter Date: 09/02/2019  PT End of Session - 09/02/19 0718    Visit Number  7    Number of Visits  16    Date for PT Re-Evaluation  10/13/19    Authorization Type  Medicaid 3 visits 06/03/11/02/2019, 12/10-12/30/20 6 units, 1/7-1/27/2021 3 visits, 1/29-3/4 10 visits    Authorization - Visit Number  4    Authorization - Number of Visits  10    PT Start Time  0714    PT Stop Time  0758    PT Time Calculation (min)  44 min    Equipment Utilized During Treatment  Gait belt    Activity Tolerance  Patient tolerated treatment well;No increased pain    Behavior During Therapy  WFL for tasks assessed/performed       Past Medical History:  Diagnosis Date  . Brain tumor ([place]) 02/20/2013   brain tumor removed in March 2014, Dr Donald Pore  . Dizziness   . Enlarged prostate   . Headache(784.0)    scattered  . Meningioma ([place])   . Seizures ([place])     Past Surgical History:  Procedure Laterality Date  . APPLICATION OF CRANIAL NAVIGATION N/A 01/10/2019   Procedure: APPLICATION OF CRANIAL NAVIGATION;  Surgeon: Erline Levine, MD;  Location: [facility];  Service: Neurosurgery;  Laterality: N/A;  . CATARACT EXTRACTION Right   . CRANIOTOMY Right 11/06/2012   Procedure: CRANIOTOMY TUMOR EXCISION;  Surgeon: Erline Levine, MD;  Location: [facility];  Service: Neurosurgery;  Laterality: Right;  Right Parasagittal craniotomy for meningioma with Stealth  . CRANIOTOMY Right 01/10/2019   Procedure: Right Parasagittal Craniotomy for Tumor;  Surgeon: Erline Levine, MD;  Location: [facility];  Service: Neurosurgery;  Laterality: Right;  Right parasagittal craniotomy for tumor  . ESOPHAGOGASTRODUODENOSCOPY    .  right knee arthroscopy      There were no vitals filed for this visit.  Subjective Assessment - 09/02/19 0716    Subjective  Pt reports that he did wear his brace a little this weekend but does not have with him today. Did work some on his pedal bike this weekend.    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 History of right THA on 2019.    Patient Stated Goals  Pt would like to be able to elimate his cane use.    Currently in Pain?  No/denies    Pain Onset  More than a month ago                       [facility] Adult PT Treatment/Exercise - 09/02/19 0718      Ambulation/Gait   Ambulation/Gait  Yes    Ambulation/Gait Assistance  5: Supervision    Ambulation/Gait Assistance Details  Pt was given verbal cues for increased left heel strike    Ambulation Distance (Feet)  230 Feet    Assistive device  None    Gait Pattern  Step-through pattern;Decreased stance time - left;Decreased step length - right;Decreased hip/knee flexion - left    Ambulation Surface  Level;Indoor      Neuro Re-ed    Neuro Re-ed Details  In // bars: backwards gait and forward march 6' x 8 with visual cues in mirror. Decreased stance time on left. Verbal cues to try to tighten gluts and try to not come off left leg so quick.      Exercises   Exercises  Other Exercises    Other Exercises   Pt performed standing left gastroc stretch 30 sec x 2. Heel raises bilateral x 10, attempted unilatera but minimal range on left. Standing on rockerboard rocking ant/post x 10 then maintaining level  x  30 sec with eyes closed. Gait on toes 6' x 6 in // bars. Noted some shakiness/clonus when on toes.  On bottom step eccentric DF off edge of step raising up into PF 20 x 2.  At bottom of steps tapping  2nd step with left foot 10 x 2 with 3# ankle weight, standing hip ext 10 x 2 bilateral  with 3# ankle weight on left and verbal cues for upright posture.             PT Education - 09/02/19 0941    Education Details  Added PF activities to HEP    Person(s) Educated  Patient    Methods  Explanation;Demonstration;Handout    Comprehension  Verbalized understanding;Returned demonstration       PT Short Term Goals - 08/30/19 1039      PT SHORT TERM GOAL #1   Title  Pt will start aquatic PT and be instructed in HEP.    Baseline  Pt has decided to hold off on aquatic PT due to Covid concerns    Time  4    Period  Weeks    Status  Deferred    Target Date  08/19/19        PT Long Term Goals - 08/08/19 2133      PT LONG TERM GOAL #1   Title  Pt will be independent with progressive HEP for strength and balance to continue gains on own.    Baseline  08/08/19: has an HEP, continues to need updating as pt progresses    Time  8    Period  Weeks    Status  On-going      PT LONG TERM GOAL #2   Title  Pt will increase gait speed to >0.84 m/s for improved gait safety in community.    Baseline  08/08/19: 0.83 m/s no AD, improved just not to goal    Time  8    Period  Weeks    Status  On-going      PT LONG TERM GOAL #3   Title  Pt will increase Berg score form 44/56 to >50/56 for improved balance and decreased fall risk.    Baseline  08/08/19: 49/56 scored today, improved by 1 point, not to goal    Period  Weeks    Status  On-going      PT LONG TERM GOAL #4   Title  Pt will ambulate >500' on varied surfaces independently without AD for improved community mobility.    Baseline  08/08/19: working on progressing gait without device with distance of 350 feet achieved to date    Time  8    Period  Weeks    Status  On-going      PT LONG TERM GOAL #5   Title  Pt will increase FGA from 14 to >19 for improved balance and decreased fall risk.    Baseline  08/08/19: 16/24 scored today, improved just  not to goal    Time  8    Period  Weeks    Status  On-going             Plan - 09/02/19 [id number]    Clinical Impression Statement  PT focused more on ankle PF strengthening during session today. Pt with more weakness on left. Pt did not have AFO during session today.    Examination-Activity Limitations  Locomotion Level;Stairs;Dressing    Examination-Participation Restrictions  Community Activity    Stability/Clinical Decision Making  Evolving/Moderate complexity    Rehab Potential  Good    PT Frequency  2x / week    PT Duration  8 weeks    PT Treatment/Interventions  ADLs/Self Care Home Management;DME Instruction;Gait training;Stair training;Functional mobility training;Therapeutic activities;Patient/family education;Neuromuscular re-education;Balance training;Therapeutic exercise;Orthotic Fit/Training;Manual techniques;Passive range of motion;Aquatic Therapy    PT Next Visit Plan  Gait training with new AFO, LE strengthening and balance reactions. Continue to work on improving left weight shift. Focus on left hip/knee flexion strengthening as well as PF strengthening.    Consulted and Agree with Plan of Care  Patient       Patient will benefit from skilled therapeutic intervention in order to improve the following deficits and impairments:  Abnormal gait, Decreased activity tolerance, Decreased balance, Difficulty walking, Decreased strength, Impaired sensation, Postural dysfunction, Impaired flexibility, Decreased mobility, Decreased range of motion  Visit Diagnosis: Other abnormalities of gait and mobility  Muscle weakness (generalized)     Problem List Patient Active Problem List   Diagnosis Date Noted  . 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[place]) 03/07/2019  . Seizures ([place]) 01/31/2019  . Meningioma, recurrent of brain ([place]) 01/31/2019  . Meningioma ([place]) 01/03/2019  . Brain tumor ([place]) 12/27/2018  . Diffuse idiopathic skeletal hyperostosis 06/05/2013    Electa Sniff, PT, DPT, NCS 09/02/2019, 9:44 AM  [facility] [address] [place], [place], [address] Phone: [phone]   Fax:  [phone]  Name: Joshua [name] MRN: [id number] Date of Birth: 1961-04-15

## 2019-09-02 NOTE — Progress Notes (Signed)
[employer] at [place], [place] [address] [phone]   Interval Evaluation  Date of Service: 09/02/19 Patient Name: Joshua [name] Patient MRN: [id number] Patient DOB: 1961-07-16 Provider: [name], MD  Identifying Statement:  Joshua [name] is a 59 y.o. 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 Vertell Limber (WHO I) 01/10/19: Repeat craniotomy, debulking resection by Dr. Vertell Limber after tumor recurrence, seizures.  Path demonstrates islands of anaplasia c/w grade II/III. 04/05/19: Completes post-operative IMRT with Dr. Lisbeth Renshaw  Interval History:  Joshua [name] presents today for follow up after MRI brain.  He describes continued numbness of the left leg, although he has improved with recent physical therapy. He only experienced one brief seizure since our last visit on 07/15/19.  Mood swings and agitation are still a problem at times. Otherwise no new or progressive neurologic deficits.  H+P (02/05/19) Patient presents to review clinical course and care for his meningioma.  Initially he presented in 2014 with headache syndrome, was found to have tumor on imaging which was resected by Dr. Vertell Limber.  The patient was lost to follow up for ~5 years until he developed seizures, described as "twitching of left leg spreading to arm" this past month.  He required loads of Keppra and Dilantin to break events.  MRI demonstrated significant regrowth of the mass, and repeat craniotomy was performed on 01/10/19.  Small amount of residual tumor was visible on post-operative MRI.  Since surgery he has continued to experience numbness and clumsiness of his left leg, requiring cane for ambulation.  He is still pending home physical therapy evaluation.  He has had "small" seizures, consisting of few seconds of shaking of left leg, occurring less than daily.   Continues on Keppra and Dilantin.    Medications: Current Outpatient Medications on File Prior to Visit  Medication Sig Dispense Refill  . aspirin EC 81 MG EC tablet Take 1 tablet (81 mg total) by mouth daily. 30 tablet 3  . azithromycin (ZITHROMAX) 250 MG tablet Take 2 today then 1 daily 6 tablet 0  . benzonatate (TESSALON) 100 MG capsule Take 2 capsules (200 mg total) by mouth 2 (two) times daily as needed for cough. 40 capsule 0  . cetirizine (ZYRTEC) 10 MG tablet Take 1 tablet (10 mg total) by mouth daily. 30 tablet 11  . clonazePAM (KLONOPIN) 0.5 MG tablet Take 1 tablet (0.5 mg total) by mouth 2 (two) times daily. 60 tablet 1  . cyclobenzaprine (FLEXERIL) 10 MG tablet Take 1 tablet (10 mg total) by mouth 2 (two) times daily as needed for muscle spasms. 60 tablet 3  . FIBER ADULT GUMMIES PO Take 1 tablet by mouth daily.    . furosemide (LASIX) 20 MG tablet Take 1 tablet (20 mg total) by mouth daily. Prn swelling.  Use sparingly 30 tablet 1  . lamoTRIgine (LAMICTAL) 100 MG tablet TAKE 1 TABLET(100 MG) BY MOUTH TWICE DAILY 60 tablet 2  . levETIRAcetam (KEPPRA) 500 MG tablet TAKE 3 TABLETS(1500 MG) BY MOUTH TWICE DAILY 180 tablet 1  . LORazepam (ATIVAN) 1 MG tablet Take 1 tablet (1 mg total) by mouth every 8 (eight) hours as needed for seizure (for breakthrough seizures). 12 tablet 0  . Melatonin 5 MG CHEW Chew 3 tablets by mouth at bedtime as needed (sleep).    . naproxen sodium (ALEVE) 220 MG tablet Take 440 mg by  mouth as needed (pain or headache).    Marland Kitchen olopatadine (PATANOL) 0.1 % ophthalmic solution Place 1 drop into both eyes 2 (two) times daily. 5 mL 0  . phenytoin (DILANTIN) 300 MG ER capsule Take 1 capsule (300 mg total) by mouth at bedtime. 30 capsule 3  . prochlorperazine (COMPAZINE) 10 MG tablet Take 1 tablet (10 mg total) by mouth every 6 (six) hours as needed for nausea or vomiting. 30 tablet 1  . triamcinolone cream (KENALOG) 0.1 % Apply 1 application topically 2 (two) times  daily. 30 g 1  . vitamin C (ASCORBIC ACID) 500 MG tablet Take 500 mg by mouth daily.    . sucralfate (CARAFATE) 1 g tablet Take 1 tablet (1 g total) by mouth 4 (four) times daily -  with meals and at bedtime for 14 days. 56 tablet 0   No current facility-administered medications on file prior to visit.    Allergies: No Known Allergies Past Medical History:  Past Medical History:  Diagnosis Date  . Brain tumor ([place]) 02/20/2013   brain tumor removed in March 2014, Dr Donald Pore  . Dizziness   . Enlarged prostate   . Headache(784.0)    scattered  . Meningioma ([place])   . Seizures ([place])    Past Surgical History:  Past Surgical History:  Procedure Laterality Date  . APPLICATION OF CRANIAL NAVIGATION N/A 01/10/2019   Procedure: APPLICATION OF CRANIAL NAVIGATION;  Surgeon: Erline Levine, MD;  Location: [facility];  Service: Neurosurgery;  Laterality: N/A;  . CATARACT EXTRACTION Right   . CRANIOTOMY Right 11/06/2012   Procedure: CRANIOTOMY TUMOR EXCISION;  Surgeon: Erline Levine, MD;  Location: [facility];  Service: Neurosurgery;  Laterality: Right;  Right Parasagittal craniotomy for meningioma with Stealth  . CRANIOTOMY Right 01/10/2019   Procedure: Right Parasagittal Craniotomy for Tumor;  Surgeon: Erline Levine, MD;  Location: [facility];  Service: Neurosurgery;  Laterality: Right;  Right parasagittal craniotomy for tumor  . ESOPHAGOGASTRODUODENOSCOPY    . right knee arthroscopy     Social History:  Social History   Socioeconomic History  . Marital status: Married    Spouse name: Not on file  . Number of children: Not on file  . Years of education: Not on file  . Highest education level: Not on file  Occupational History  . Not on file  Tobacco Use  . Smoking status: Former Smoker    Types: Cigarettes    Quit date: 01/29/2019    Years since quitting: 0.5  . Smokeless tobacco: Never Used  . Tobacco comment: hasn't smoked in a week  Substance and Sexual Activity  . Alcohol use: Yes     Alcohol/week: 8.0 standard drinks    Types: 6 Cans of beer, 2 Shots of liquor per week    Comment: occasionally  . Drug use: No  . Sexual activity: Yes  Other Topics Concern  . Not on file  Social History Narrative  . Not on file   Social Determinants of Health   Financial Resource Strain:   . Difficulty of Paying Living Expenses: Not on file  Food Insecurity:   . Worried About Charity fundraiser in the Last Year: Not on file  . Ran Out of Food in the Last Year: Not on file  Transportation Needs: No Transportation Needs  . Lack of Transportation (Medical): No  . Lack of Transportation (Non-Medical): No  Physical Activity:   . Days of Exercise per Week: Not on file  . Minutes  of Exercise per Session: Not on file  Stress:   . Feeling of Stress : Not on file  Social Connections:   . Frequency of Communication with Friends and Family: Not on file  . Frequency of Social Gatherings with Friends and Family: Not on file  . Attends Religious Services: Not on file  . Active Member of Clubs or Organizations: Not on file  . Attends Archivist Meetings: Not on file  . Marital Status: Not on file  Intimate Partner Violence:   . Fear of Current or Ex-Partner: Not on file  . Emotionally Abused: Not on file  . Physically Abused: Not on file  . Sexually Abused: Not on file   Family History:  Family History  Problem Relation Age of Onset  . Hypertension Mother   . Hypertension Father     Review of Systems: Constitutional: Denies fevers, chills or abnormal weight loss Eyes: Denies blurriness of vision Ears, nose, mouth, throat, and face: Denies mucositis or sore throat Respiratory: Denies cough, dyspnea or wheezes Cardiovascular: Denies palpitation, chest discomfort or lower extremity swelling Gastrointestinal:  Denies nausea, constipation, diarrhea GU: Denies dysuria or incontinence Skin: Denies abnormal skin rashes Neurological: Per HPI Musculoskeletal: Denies joint  pain, back or neck discomfort. No decrease in ROM Behavioral/Psych: Denies anxiety, disturbance in thought content, and mood instability  Physical Exam: Vitals:   09/02/19 0856  BP: 130/79  Pulse: 92  Resp: 19  Temp: 99.2 F (37.3 C)  SpO2: 99%   KPS: 80. General: Alert, cooperative, pleasant, in no acute distress Head: Craniotomy scar noted, dry and intact. EENT: No conjunctival injection or scleral icterus. Oral mucosa moist Lungs: Resp effort normal Cardiac: Regular rate and rhythm Abdomen: Soft, non-distended abdomen Skin: No rashes cyanosis or petechiae. Extremities: No clubbing or edema  Neurologic Exam: Mental Status: Awake, alert, attentive to examiner. Oriented to self and environment. Language is fluent with intact comprehension.  Cranial Nerves: Visual acuity is grossly normal. Visual fields are full. Extra-ocular movements intact. No ptosis. Face is symmetric, tongue midline. Motor: Tone and bulk are normal.Power is impaired in left leg, 4+/5 distally. Reflexes are symmetric, no pathologic reflexes present. Impaired heel to shin left leg Sensory: Diminished to light touch and temp left lower leg Gait: Independent  Labs: I have reviewed the data as listed    Component Value Date/Time   NA 139 07/09/2019 0857   K 4.0 07/09/2019 0857   CL 106 07/09/2019 0857   CO2 23 07/09/2019 0857   GLUCOSE 123 (H) 07/09/2019 0857   BUN 10 07/09/2019 0857   CREATININE 0.88 07/09/2019 0857   CALCIUM 9.1 07/09/2019 0857   PROT 6.2 (L) 07/09/2019 0857   ALBUMIN 3.6 07/09/2019 0857   AST 20 07/09/2019 0857   ALT 25 07/09/2019 0857   ALKPHOS 68 07/09/2019 0857   BILITOT 0.3 07/09/2019 0857   GFRNONAA >60 07/09/2019 0857   GFRAA >60 07/09/2019 0857   Lab Results  Component Value Date   WBC 5.6 07/09/2019   NEUTROABS 3.2 07/09/2019   HGB 15.2 07/09/2019   HCT 44.6 07/09/2019   MCV 90.5 07/09/2019   PLT 227 07/09/2019   Imaging:  [facility] Clinician Interpretation: I have  personally reviewed the CNS images as listed.  My interpretation, in the context of the patient's clinical presentation, is stable disease  MR Brain W Wo Contrast  Result Date: 08/30/2019 CLINICAL DATA:  Meningioma follow-up post resection. EXAM: MRI HEAD WITHOUT AND WITH CONTRAST TECHNIQUE: Multiplanar, multiecho pulse  sequences of the brain and surrounding structures were obtained without and with intravenous contrast. 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 There are susceptibility artifacts in the bone/scalp related to the surgical hardware. There is no significant change of the previously seen epidural fluid collection and dural thickening and enhancement subjacent to the area of craniotomy. There is also no significant change of the more nodular appearing enhancement at the posterior margin of the surgical cavity. No new area of enhancement identified. Small area of encephalomalacia and gliosis in the medial aspect of the right posterior frontal parietal region and less evident in the left medial anterior parietal region, unchanged. The remainder of the brain parenchyma has normal signal characteristics. No acute infarct, hemorrhage or hydrocephalus. Vascular: Segmental resection of the posterior aspect of the superior sagittal sinus. Arterial flow voids are maintained. Skull and upper cervical spine: Postsurgical changes from prior craniotomy, as described above. Otherwise unremarkable. Sinuses/Orbits: Negative. Other: None. 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 No change in size and shape of the nodular appearing enhancement at the posterior margin of the surgical cavity. No new area of  enhancement. Electronically Signed   By: Pedro Earls M.D.   On: 08/30/2019 11:52    Assessment/Plan 1. Meningioma, recurrent of brain ([place])  2. Seizures [facility])  Joshua [name] is clinically and radiographically stable today. Breakthrough seizures are still occurring but in much more limited fashion.  For refractory focal epilepsy we recommended the following changes: -Decrease Keppra to 1000mg  BID  Regarding additional AEDs: -Con't Clonazepam 0.5mg  BID -Con't PHT 300mg  -Continue Lamictal 100mg  BID  We appreciate the opportunity to participate in the care of Joshua [name].  We will call him in 2 months to continue adjustments to this regimen, with planned further reduction in Keppra and potential compensatory increase in Lamictal.  All questions were answered. The patient knows to call the clinic with any problems, questions or concerns. No barriers to learning were detected.  Next brain MRI should be in 4 months.  The total time spent in the encounter was 40 minutes and more than 50% was on counseling and review of test results   [name], MD Medical Director of Neuro-Oncology [facility] 09/02/19 9:00 AM

## 2019-09-02 NOTE — Patient Instructions (Signed)
Access Code: [email or web address] URL: [email or web address] Date: 09/02/2019 Prepared by: Cherly Anderson  Exercises Tandem Stance - 3 reps - 1 sets - 30 sec hold - 2x daily - 7x weekly Walking March - 3 reps - 1 sets - 1x daily - 7x weekly Tandem Walking with Counter Support - 3 reps - 1 sets - 2x daily - 7x weekly Side Stepping with Counter Support - 3 reps - 1 sets - 2x daily - 7x weekly Romberg Stance on Foam Pad - 3 reps - 1 sets - 30 sec hold - 2x daily - 7x weekly Romberg Stance on Foam Pad with Head Rotation - 10 reps - 1 sets - 2x daily - 7x weekly Standing Balance with Eyes Closed on Foam - 3 reps - 1 sets - 30 sec hold - 2x daily - 7x weekly Mini Squat with Counter Support - 10 reps - 2 sets - 2x daily - 7x weekly Active assist ankle DF then resisted PF - 10 reps - 3 sets - 2x daily - 7x weekly Ankle Inversion Eversion Towel Slide - 10 reps - 3 sets - 1x daily - 7x weekly Standing Hip Extension with Ankle Weight - 10 reps - 2 sets - 1x daily - 5x weekly Standing Alternating Knee Flexion with Ankle Weights - 10 reps - 2 sets - 1x daily - 5x weekly Heel rises with counter support - 10 reps - 3 sets - 2x daily - 7x weekly Toe Walking with Counter Support - 3-4 reps - 1 sets - 2x daily - 7x weekly

## 2019-09-03 ENCOUNTER — Telehealth: Payer: Self-pay | Admitting: Internal Medicine

## 2019-09-03 NOTE — Telephone Encounter (Signed)
Scheduled appt per 2/15 los.  Left a VM Of the appt date and times.

## 2019-09-06 ENCOUNTER — Ambulatory Visit: Payer: Medicaid Other

## 2019-09-08 ENCOUNTER — Encounter (HOSPITAL_COMMUNITY): Payer: Self-pay

## 2019-09-08 ENCOUNTER — Ambulatory Visit (HOSPITAL_COMMUNITY)
Admission: EM | Admit: 2019-09-08 | Discharge: 2019-09-08 | Disposition: A | Discharge status: Home / Self Care | Payer: Medicaid Other | Attending: Emergency Medicine | Admitting: Emergency Medicine

## 2019-09-08 ENCOUNTER — Other Ambulatory Visit: Payer: Self-pay

## 2019-09-08 DIAGNOSIS — R109 Unspecified abdominal pain: Secondary | ICD-10-CM | POA: Diagnosis not present

## 2019-09-08 LAB — POCT URINALYSIS DIP (DEVICE)
Bilirubin Urine: NEGATIVE
Glucose, UA: NEGATIVE mg/dL
Ketones, ur: NEGATIVE mg/dL
Leukocytes,Ua: NEGATIVE
Nitrite: NEGATIVE
Protein, ur: NEGATIVE mg/dL
Specific Gravity, Urine: >=1.03 (ref 1.005–1.030)
Urobilinogen, UA: 0.2 mg/dL (ref 0.0–1.0)
pH: 5 (ref 5.0–8.0)

## 2019-09-08 MED ORDER — MELOXICAM 7.5 MG PO TABS: 7.5000 mg | ORAL_TABLET | Freq: Every day | ORAL | 0 refills | Status: DC

## 2019-09-08 MED ORDER — ONDANSETRON 4 MG PO TBDP: 4.0000 mg | ORAL_TABLET | Freq: Three times a day (TID) | ORAL | 0 refills | Status: DC | PRN

## 2019-09-08 NOTE — Discharge Instructions (Signed)
You have small amount of blood to the urine, which may be consistent with a kidney stone.  However, your exam and description of your pain are not entirely consistent with kidney stone.  We will try a daily antiinflammatory, rest as able your core muscles, nausea medication as needed, to see if your pain improves.  If no improvement please follow up with your PCP later this week as you may need a CT scan to evaluate for kidney stones.  If severe pain or worsening of pain, fevers, unable to urinate or blood in urine please go to the ER.

## 2019-09-08 NOTE — ED Triage Notes (Signed)
Pt c/o right flank pain, nausea x1 week. States had one occurrence of painful urination approx 1 month ago. Denies injury or strain to area, fever, chills.  Pt states muscle relaxant has not helped.

## 2019-09-08 NOTE — ED Provider Notes (Signed)
[facility]    CSN: [id number] Arrival date & time: 09/08/19  1444      History   Chief Complaint Chief Complaint  Patient presents with  . Flank Pain    HPI Joshua [name] is a 59 y.o. male.   Joshua [name] presents with complaints of right flank pain which is sharp, for the past two weeks. Constant but activity and movement worsens it. Has taken ibuprofen which briefly helps, muscle relaxers haven't helped. Hasn't worsened. Doesn't radiate to abdomen. No known injury or trauma. No rash. He walks with a cane, he is right handed. Has also been doing therapy and walks, between two rails. No urinary symptoms. No blood to urine. No fevers. Sometimes experiences nausea with the pain. History  Of brain tumor, headache, seizures.     ROS per HPI, negative if not otherwise mentioned.      Past Medical History:  Diagnosis Date  . Brain tumor ([place]) 02/20/2013   brain tumor removed in March 2014, Dr Donald Pore  . Dizziness   . Enlarged prostate   . Headache(784.0)    scattered  . Meningioma ([place])   . Seizures [facility])     Patient Active Problem List   Diagnosis Date Noted  . 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[place]) 03/07/2019  . Seizures ([place]) 01/31/2019  . Meningioma, recurrent of brain ([place]) 01/31/2019  . Meningioma ([place]) 01/03/2019  . Brain tumor ([place]) 12/27/2018  . Diffuse idiopathic skeletal hyperostosis 06/05/2013    Past Surgical History:  Procedure Laterality Date  . APPLICATION OF CRANIAL NAVIGATION N/A 01/10/2019   Procedure: APPLICATION OF CRANIAL NAVIGATION;  Surgeon: Erline Levine, MD;  Location: [facility];  Service: Neurosurgery;  Laterality: N/A;  . CATARACT EXTRACTION Right   . CRANIOTOMY Right 11/06/2012   Procedure: CRANIOTOMY TUMOR EXCISION;  Surgeon: Erline Levine, MD;  Location: [facility];  Service: Neurosurgery;  Laterality: Right;  Right Parasagittal craniotomy for meningioma with Stealth  . CRANIOTOMY Right 01/10/2019   Procedure:  Right Parasagittal Craniotomy for Tumor;  Surgeon: Erline Levine, MD;  Location: [facility];  Service: Neurosurgery;  Laterality: Right;  Right parasagittal craniotomy for tumor  . ESOPHAGOGASTRODUODENOSCOPY    . right knee arthroscopy         Home Medications    Prior to Admission medications   Medication Sig Start Date End Date Taking? Authorizing Provider  aspirin EC 81 MG EC tablet Take 1 tablet (81 mg total) by mouth daily. 01/14/19  Yes Erline Levine, MD  clonazePAM (KLONOPIN) 0.5 MG tablet Take 1 tablet (0.5 mg total) by mouth 2 (two) times daily. 08/20/19  Yes Vaslow, Acey Lav, MD  cyclobenzaprine (FLEXERIL) 10 MG tablet Take 1 tablet (10 mg total) by mouth 2 (two) times daily as needed for muscle spasms. 07/23/19  Yes Charlott Rakes, MD  furosemide (LASIX) 20 MG tablet Take 1 tablet (20 mg total) by mouth daily. Prn swelling.  Use sparingly 08/22/19  Yes McClung, Angela M, PA-C  lamoTRIgine (LAMICTAL) 100 MG tablet TAKE 1 TABLET(100 MG) BY MOUTH TWICE DAILY 06/25/19  Yes Vaslow, Acey Lav, MD  levETIRAcetam (KEPPRA) 500 MG tablet TAKE 2 TABLETS(1000 MG) BY MOUTH TWICE DAILY 09/02/19  Yes Vaslow, Acey Lav, MD  LORazepam (ATIVAN) 1 MG tablet Take 1 tablet (1 mg total) by mouth every 8 (eight) hours as needed for seizure (for breakthrough seizures). 07/23/19  Yes Vaslow, Acey Lav, MD  phenytoin (DILANTIN) 300 MG ER capsule Take 1 capsule (300 mg total)  by mouth at bedtime. 06/25/19  Yes Vaslow, Acey Lav, MD  FIBER ADULT GUMMIES PO Take 1 tablet by mouth daily.    [provider]  Melatonin 5 MG CHEW Chew 3 tablets by mouth at bedtime as needed (sleep).    [provider]  meloxicam (MOBIC) 7.5 MG tablet Take 1 tablet (7.5 mg total) by mouth daily. 09/08/19   Zigmund Gottron, NP  naproxen sodium (ALEVE) 220 MG tablet Take 440 mg by mouth as needed (pain or headache).    [provider]  olopatadine (PATANOL) 0.1 % ophthalmic solution Place 1 drop into both eyes 2 (two)  times daily. 08/13/19   Vanessa Kick, MD  ondansetron (ZOFRAN-ODT) 4 MG disintegrating tablet Take 1 tablet (4 mg total) by mouth every 8 (eight) hours as needed for nausea or vomiting. 09/08/19   Zigmund Gottron, NP  prochlorperazine (COMPAZINE) 10 MG tablet Take 1 tablet (10 mg total) by mouth every 6 (six) hours as needed for nausea or vomiting. 03/08/19   Kyung Rudd, MD  sucralfate (CARAFATE) 1 g tablet Take 1 tablet (1 g total) by mouth 4 (four) times daily -  with meals and at bedtime for 14 days. 07/23/19 08/06/19  Charlott Rakes, MD  triamcinolone cream (KENALOG) 0.1 % Apply 1 application topically 2 (two) times daily. 02/25/19   Charlott Rakes, MD  vitamin C (ASCORBIC ACID) 500 MG tablet Take 500 mg by mouth daily.    [provider]  cetirizine (ZYRTEC) 10 MG tablet Take 1 tablet (10 mg total) by mouth daily. 08/08/19 09/08/19  Argentina Donovan, PA-C    Family History Family History  Problem Relation Age of Onset  . Hypertension Mother   . Hypertension Father     Social History Social History   Tobacco Use  . Smoking status: Former Smoker    Types: Cigarettes    Quit date: 01/29/2019    Years since quitting: 0.6  . Smokeless tobacco: Never Used  . Tobacco comment: hasn't smoked in a week  Substance Use Topics  . Alcohol use: Yes    Alcohol/week: 8.0 standard drinks    Types: 6 Cans of beer, 2 Shots of liquor per week    Comment: occasionally  . Drug use: No     Allergies   Patient has no known allergies.   Review of Systems Review of Systems   Physical Exam Triage Vital Signs ED Triage Vitals  Enc Vitals Group     BP 09/08/19 1516 (!) 150/91     Pulse Rate 09/08/19 1516 (!) 104     Resp 09/08/19 1516 18     Temp 09/08/19 1516 98.8 F (37.1 C)     Temp Source 09/08/19 1516 Oral     SpO2 09/08/19 1516 98 %     Weight --      Height --      Head Circumference --      Peak Flow --      Pain Score 09/08/19 1533 8     Pain Loc --      Pain Edu? --       Excl. in [place]? --    No data found.  Updated Vital Signs BP (!) 150/91 (BP Location: Right Arm)   Pulse (!) 104   Temp 98.8 F (37.1 C) (Oral)   Resp 18   SpO2 98%   Visual Acuity Right Eye Distance:   Left Eye Distance:   Bilateral Distance:  Right Eye Near:   Left Eye Near:    Bilateral Near:     Physical Exam Constitutional:      Appearance: He is well-developed.  Cardiovascular:     Rate and Rhythm: Normal rate.  Pulmonary:     Effort: Pulmonary effort is normal.  Abdominal:     Tenderness: There is no right CVA tenderness or left CVA tenderness.       Comments: Point specific tenderness to right flank on palpation; worse with transition from lay to sit and sit to lay noted; no rash present   Skin:    General: Skin is warm and dry.  Neurological:     Mental Status: He is alert and oriented to person, place, and time.      UC Treatments / Results  Labs (all labs ordered are listed, but only abnormal results are displayed) Labs Reviewed  POCT URINALYSIS DIP (DEVICE) - Abnormal; Notable for the following components:      Result Value   Hgb urine dipstick TRACE (*)    All other components within normal limits    EKG   Radiology No results found.  Procedures Procedures (including critical care time)  Medications Ordered in UC Medications - No data to display  Initial Impression / Assessment and Plan / UC Course  I have reviewed the triage vital signs and the nursing notes.  Pertinent labs & imaging results that were available during my care of the patient were reviewed by me and considered in my medical decision making (see chart for details).    Urine with trace hgb noted. Pain has not moved at all in 2 weeks and is reproducible on palpation as well as with movement. Nephrolithiasis still considered however. Encouraged follow up with pcp this week if no improvement of symptoms as renal study may be appropriate. Return precautions provided.  Patient verbalized understanding and agreeable to plan.  Ambulatory out of clinic without difficulty.    Final Clinical Impressions(s) / UC Diagnoses   Final diagnoses:  Flank pain     Discharge Instructions     You have small amount of blood to the urine, which may be consistent with a kidney stone.  However, your exam and description of your pain are not entirely consistent with kidney stone.  We will try a daily antiinflammatory, rest as able your core muscles, nausea medication as needed, to see if your pain improves.  If no improvement please follow up with your PCP later this week as you may need a CT scan to evaluate for kidney stones.  If severe pain or worsening of pain, fevers, unable to urinate or blood in urine please go to the ER.    ED Prescriptions    Medication Sig Dispense Auth. Provider   meloxicam (MOBIC) 7.5 MG tablet Take 1 tablet (7.5 mg total) by mouth daily. 20 tablet Augusto Gamble B, NP   ondansetron (ZOFRAN-ODT) 4 MG disintegrating tablet Take 1 tablet (4 mg total) by mouth every 8 (eight) hours as needed for nausea or vomiting. 12 tablet Zigmund Gottron, NP     PDMP not reviewed this encounter.   Zigmund Gottron, NP 09/08/19 2156

## 2019-09-12 ENCOUNTER — Ambulatory Visit: Payer: Medicaid Other | Attending: Internal Medicine

## 2019-09-12 ENCOUNTER — Other Ambulatory Visit: Payer: Self-pay

## 2019-09-12 DIAGNOSIS — Z23 Encounter for immunization: Secondary | ICD-10-CM | POA: Insufficient documentation

## 2019-09-12 NOTE — Progress Notes (Signed)
   Covid-19 Vaccination Clinic  Name:  Joshua [name]    MRN: [id number] DOB: 02/04/1961  09/12/2019  [name] was observed post Covid-19 immunization for 15 minutes without incidence. He was provided with Vaccine Information Sheet and instruction to access the V-Safe system.   [name] was instructed to call 911 with any severe reactions post vaccine: Marland Kitchen Difficulty breathing  . Swelling of your face and throat  . A fast heartbeat  . A bad rash all over your body  . Dizziness and weakness    Immunizations Administered    Name Date Dose VIS Date Route   Moderna COVID-19 Vaccine 09/12/2019  3:10 PM 0.5 mL 06/18/2019 Intramuscular   Manufacturer: Moderna   LotKQ:[id number]   [place] QuarterPO:[id number]

## 2019-09-13 ENCOUNTER — Other Ambulatory Visit: Payer: Self-pay

## 2019-09-13 ENCOUNTER — Ambulatory Visit: Payer: Medicaid Other

## 2019-09-13 DIAGNOSIS — M6281 Muscle weakness (generalized): Secondary | ICD-10-CM

## 2019-09-13 DIAGNOSIS — R2689 Other abnormalities of gait and mobility: Secondary | ICD-10-CM

## 2019-09-13 NOTE — Therapy (Signed)
[facility] [address] [place], [place], [address] Phone: [phone]   Fax:  [phone]  Physical Therapy Treatment/Recert  Patient Details  Name: Joshua [name] MRN: [id number] Date of Birth: [birth year] Referring Provider (PT): Cecil Cobbs   Encounter Date: 09/13/2019  PT End of Session - 09/13/19 0719    Visit Number  8    Number of Visits  12    Date for PT Re-Evaluation  10/13/19    Authorization Type  Medicaid 3 visits 06/03/11/02/2019, 12/10-12/30/20 6 units, 1/7-1/27/2021 3 visits, 1/29-3/4 10 visits    Authorization - Visit Number  5    Authorization - Number of Visits  10    PT Start Time  0715    PT Stop Time  0758    PT Time Calculation (min)  43 min    Equipment Utilized During Treatment  --    Activity Tolerance  Patient tolerated treatment well;No increased pain    Behavior During Therapy  WFL for tasks assessed/performed       Past Medical History:  Diagnosis Date  . Brain tumor ([place]) 02/20/2013   brain tumor removed in March 2014, Dr Donald Pore  . Dizziness   . Enlarged prostate   . Headache(784.0)    scattered  . Meningioma ([place])   . Seizures ([place])     Past Surgical History:  Procedure Laterality Date  . APPLICATION OF CRANIAL NAVIGATION N/A 01/10/2019   Procedure: APPLICATION OF CRANIAL NAVIGATION;  Surgeon: Erline Levine, MD;  Location: [facility];  Service: Neurosurgery;  Laterality: N/A;  . CATARACT EXTRACTION Right   . CRANIOTOMY Right 11/06/2012   Procedure: CRANIOTOMY TUMOR EXCISION;  Surgeon: Erline Levine, MD;  Location: [facility];  Service: Neurosurgery;  Laterality: Right;  Right Parasagittal craniotomy for meningioma with Stealth  . CRANIOTOMY Right 01/10/2019   Procedure: Right Parasagittal Craniotomy for Tumor;  Surgeon: Erline Levine, MD;  Location: [facility];  Service: Neurosurgery;  Laterality: Right;  Right parasagittal craniotomy for tumor  . ESOPHAGOGASTRODUODENOSCOPY    .  right knee arthroscopy      There were no vitals filed for this visit.  Subjective Assessment - 09/13/19 0718    Subjective  Pt reports that his right hip has been bothering him more lately. He did go to urgent care the other day for right flank pain to make sure nothing internal wrong. They prescribed him meloxicam. Pt reports that he had a fall at sister's the other day when going down her 2 steps that were wet and cane slipped on mud and went down. Denies any injuries.    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 History of right THA on 2019.    Patient Stated Goals  Pt would like to be able to elimate his cane use.    Currently in Pain?  Yes    Pain Score  3     Pain Location  Hip    Pain Orientation  Right    Pain Descriptors / Indicators  Aching    Pain Type  Acute pain    Pain Onset  More than a month ago    Aggravating Factors   up a lot or turns.         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    Referring Provider (PT)  Cecil Cobbs    Onset Date/Surgical Date  01/10/19      Functional Gait  Assessment   Gait assessed   Yes    Gait Level Surface  Walks 20 ft, slow speed, abnormal gait pattern, evidence for imbalance or deviates 10-15 in outside of the 12 in walkway width. Requires more than 7 sec to ambulate 20 ft.    Change in Gait Speed  Able to smoothly change walking speed without loss of balance or gait deviation. Deviate no more than 6 in outside of the 12 in walkway width.    Gait with Horizontal Head Turns  Performs head turns smoothly with no change in gait. Deviates no more than 6 in outside 12 in walkway width    Gait with Vertical Head Turns  Performs head turns with no change in gait. Deviates no more than 6 in  outside 12 in walkway width.    Gait and Pivot Turn  Pivot turns safely within 3 sec and stops quickly with no loss of balance.    Step Over Obstacle  Is able to step over one shoe box (4.5 in total height) without changing gait speed. No evidence of imbalance.    Gait with Narrow Base of Support  Ambulates less than 4 steps heel to toe or cannot perform without assistance.    Gait with Eyes Closed  Walks 20 ft, uses assistive device, slower speed, mild gait deviations, deviates 6-10 in outside 12 in walkway width. Ambulates 20 ft in less than 9 sec but greater than 7 sec.    Ambulating Backwards  Walks 20 ft, uses assistive device, slower speed, mild gait deviations, deviates 6-10 in outside 12 in walkway width.    Steps  Alternating feet, must use rail.    Total Score  21                   [facility] Adult PT Treatment/Exercise - 09/13/19 0721      Transfers   Transfers  Sit to Stand;Stand to Sit    Sit to Stand  7: Independent    Stand to Sit  7: Independent      Ambulation/Gait   Ambulation/Gait  Yes    Ambulation/Gait Assistance  5: Supervision    Ambulation/Gait Assistance Details  Pt was given verbal cues to increase left hip flexion.    Ambulation Distance (Feet)  575 Feet    Assistive device  None    Gait Pattern  Step-through pattern;Decreased stance time - left;Decreased hip/knee flexion - left;Decreased dorsiflexion - left    Ambulation Surface  Level;Indoor    Gait velocity  10.97 comfortable= 0.62ms and 9.99 fast=1.039m    Ramp  5: Supervision    Curb  5: Supervision    Curb Details (indicate cue type and reason)  x 2 with leading with left leg first time and right second time. Pt was safer leading with left to be sure he cleared it.      Standardized Balance Assessment   Standardized Balance Assessment  Berg Balance Test      Berg Balance Test   Sit to Stand  Able to stand without using hands and stabilize independently    Standing Unsupported  Able to stand  safely 2 minutes    Sitting with Back Unsupported but Feet Supported on Floor or Stool  Able to sit safely and securely 2 minutes    Stand to Sit  Sits safely with minimal use of hands    Transfers  Able to transfer safely, minor use of hands    Standing Unsupported with Eyes Closed  Able to stand 10 seconds safely    Standing Ubsupported with Feet Together  Able to place feet together independently and stand 1 minute safely    From Standing, Reach Forward with Outstretched Arm  Can reach confidently >25 cm (10")    From Standing Position, Pick up Object from Floor  Able to pick up shoe safely and easily    From Standing Position, Turn to Look Behind Over each Shoulder  Looks behind from both sides and weight shifts well    Turn 360 Degrees  Able to turn 360 degrees safely in 4 seconds or less    Standing Unsupported, Alternately Place Feet on Step/Stool  Able to stand independently and safely and complete 8 steps in 20 seconds    Standing Unsupported, One Foot in Front  Able to plae foot ahead of the other independently and hold 30 seconds    Standing on One Leg  Tries to lift leg/unable to hold 3 seconds but remains standing independently   right >10 sec   Total Score  52             PT Education - 09/13/19 1112    Education Details  PT discussed plan to recert 1w4 after discussion with patient.    Person(s) Educated  Patient    Methods  Explanation    Comprehension  Verbalized understanding       PT Short Term Goals - 08/30/19 1039      PT SHORT TERM GOAL #1   Title  Pt will start aquatic PT and be instructed in HEP.    Baseline  Pt has decided to hold off on aquatic PT due to Covid concerns    Time  4    Period  Weeks    Status  Deferred    Target Date  08/19/19        PT Long Term Goals - 09/13/19 0732      PT LONG TERM GOAL #1   Title  Pt will be independent with progressive HEP for strength and balance to continue gains on own.    Baseline  Pt has been  performing HEP as instructed.    Time  8    Period  Weeks    Status  Achieved      PT LONG TERM GOAL #2   Title  Pt will increase gait speed to >0.84 m/s for improved gait safety in community.    Baseline  08/08/19: 0.83 m/s no AD, improved just not to goal. 09/13/19 0.33ms    Time  8    Period  Weeks    Status  Achieved      PT LONG TERM GOAL #3   Title  Pt will increase Berg score form 44/56 to >50/56 for improved balance and decreased fall risk.    Baseline  08/08/19: 49/56 scored today, improved by 1 point, not to goal, 09/13/19 52/56    Period  Weeks    Status  Achieved      PT LONG TERM GOAL #4   Title  Pt will ambulate >500' on varied surfaces independently without AD for improved community mobility.    Baseline  Pt ambulated 575' without AD today at supervision level.    Time  8    Period  Weeks    Status  Not Met      PT LONG TERM GOAL #5   Title  Pt will increase FGA from 14 to >19 for improved balance and decreased fall risk.    Baseline  08/08/19: 16/24 scored today, FGA=21/30 on 09/13/19    Time  8    Period  Weeks    Status  Achieved      Updated PT goals: PT Short Term Goals - 09/13/19 1123      PT SHORT TERM GOAL #1   Title  STG=LTG      PT Long Term Goals - 09/13/19 1123      PT LONG TERM GOAL #1   Title  Pt will increase FGA from 21/30 to >23/30 for improved balance and gait safety.    Baseline  21/30 on 09/13/19    Time  4    Period  Weeks    Status  New    Target Date  10/13/19      PT LONG TERM GOAL #2   Title  Pt will ambulate >700' on varied surfaces independently without AD for improved community mobility.    Baseline  Pt ambulated 575' without AD today at supervision level. Currently using SPC outside of home.    Time  4    Period  Weeks    Status  Revised    Target Date  10/13/19            Plan - 09/13/19 1115    Clinical Impression Statement  Pt again did not have AFO during session reporting that he has not been wearing much. PT  reassessed LTG with patient meeting 4/5 goals. Pt increased Berg score to 52/56 indicating lower fall risk, FGA score to 21/30 indicating medium fall risk. Pt has increased gait speed to 0.31ms which is safe community ambulator speed. Progressing to gait without AD increasing gait distance today but at supervision level for safety. Pt continues to use SPC when outside home. Pt will benefit from continued PT to continue to progress gait safety, balance and strength to maximize independence and safety in the community.    Examination-Activity Limitations  Locomotion Level;Stairs;Dressing    Examination-Participation Restrictions  Community Activity    Stability/Clinical Decision Making  Evolving/Moderate complexity    Rehab Potential  Good    PT Frequency  1x / week    PT Duration  4 weeks    PT Treatment/Interventions  ADLs/Self Care Home Management;DME Instruction;Gait training;Stair training;Functional mobility training;Therapeutic activities;Patient/family education;Neuromuscular re-education;Balance training;Therapeutic exercise;Orthotic Fit/Training;Manual techniques;Passive range of motion;Aquatic Therapy    PT Next Visit Plan  Gait training with new AFO if he brings (has been choosing not to wear it and doing okay without) on varied surfaces, LE strengthening and balance reactions. Continue to work on improving left weight shift. Focus on left hip/knee flexion strengthening as well as PF strengthening.    Consulted and Agree with Plan of Care  Patient       Patient will benefit from skilled therapeutic intervention in order to improve the following deficits and impairments:  Abnormal gait, Decreased activity tolerance, Decreased balance, Difficulty walking, Decreased strength, Impaired sensation, Postural dysfunction, Impaired flexibility, Decreased mobility, Decreased range of motion  Visit Diagnosis: Other abnormalities of gait and mobility  Muscle weakness (generalized)     Problem  List Patient Active Problem List   Diagnosis Date Noted  . 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 03[place]  . [place] ([facility] 01/31/2019  . Mening[place]oma, recurrent of brain ([facility] 01/31/2019  .  Meningioma ([place]) 01/03/2019  . Brain tumor ([facility]) 12/27/2018  . Diffuse idiopathic skeletal hyperostosis 06/05/2013    Electa Sniff, PT, DPT, NCS 09/13/2019, 11:23 AM  [facility] [address] [place], [place], [address] Phone: [phone]   Fax:  [phone]  Name: Joshua [name] MRN: [id number] Date of Birth: 10-20-1960

## 2019-09-17 ENCOUNTER — Ambulatory Visit: Payer: Medicaid Other | Admitting: Physical Therapy

## 2019-09-19 ENCOUNTER — Other Ambulatory Visit: Payer: Self-pay

## 2019-09-19 ENCOUNTER — Encounter (HOSPITAL_COMMUNITY): Payer: Self-pay

## 2019-09-19 ENCOUNTER — Emergency Department (HOSPITAL_COMMUNITY)
Admission: EM | Admit: 2019-09-19 | Discharge: 2019-09-19 | Disposition: A | Discharge status: Home / Self Care | Payer: Medicaid Other | Attending: Emergency Medicine | Admitting: Emergency Medicine

## 2019-09-19 ENCOUNTER — Emergency Department (HOSPITAL_COMMUNITY): Payer: Medicaid Other

## 2019-09-19 DIAGNOSIS — Z85841 Personal history of malignant neoplasm of brain: Secondary | ICD-10-CM | POA: Diagnosis not present

## 2019-09-19 DIAGNOSIS — M542 Cervicalgia: Secondary | ICD-10-CM | POA: Insufficient documentation

## 2019-09-19 DIAGNOSIS — I6529 Occlusion and stenosis of unspecified carotid artery: Secondary | ICD-10-CM | POA: Diagnosis not present

## 2019-09-19 DIAGNOSIS — Z79899 Other long term (current) drug therapy: Secondary | ICD-10-CM | POA: Insufficient documentation

## 2019-09-19 DIAGNOSIS — R519 Headache, unspecified: Secondary | ICD-10-CM | POA: Insufficient documentation

## 2019-09-19 DIAGNOSIS — R569 Unspecified convulsions: Secondary | ICD-10-CM | POA: Insufficient documentation

## 2019-09-19 DIAGNOSIS — R200 Anesthesia of skin: Secondary | ICD-10-CM | POA: Diagnosis present

## 2019-09-19 LAB — CBC WITH DIFFERENTIAL/PLATELET
Abs Immature Granulocytes: 0.01 10*3/uL (ref 0.00–0.07)
Basophils Absolute: 0.1 10*3/uL (ref 0.0–0.1)
Basophils Relative: 1 %
Eosinophils Absolute: 0.2 10*3/uL (ref 0.0–0.5)
Eosinophils Relative: 4 %
HCT: 46 % (ref 39.0–52.0)
Hemoglobin: 15.5 g/dL (ref 13.0–17.0)
Immature Granulocytes: 0 %
Lymphocytes Relative: 35 %
Lymphs Abs: 1.7 10*3/uL (ref 0.7–4.0)
MCH: 30.6 pg (ref 26.0–34.0)
MCHC: 33.7 g/dL (ref 30.0–36.0)
MCV: 90.7 fL (ref 80.0–100.0)
Monocytes Absolute: 0.6 10*3/uL (ref 0.1–1.0)
Monocytes Relative: 12 %
Neutro Abs: 2.3 10*3/uL (ref 1.7–7.7)
Neutrophils Relative %: 48 %
Platelets: 241 10*3/uL (ref 150–400)
RBC: 5.07 MIL/uL (ref 4.22–5.81)
RDW: 12.5 % (ref 11.5–15.5)
WBC: 4.8 10*3/uL (ref 4.0–10.5)
nRBC: 0 % (ref 0.0–0.2)

## 2019-09-19 LAB — BASIC METABOLIC PANEL
Anion gap: 7 (ref 5–15)
BUN: 13 mg/dL (ref 6–20)
CO2: 27 mmol/L (ref 22–32)
Calcium: 9.3 mg/dL (ref 8.9–10.3)
Chloride: 106 mmol/L (ref 98–111)
Creatinine, Ser: 1.04 mg/dL (ref 0.61–1.24)
GFR calc Af Amer: >60 mL/min (ref >60)
GFR calc non Af Amer: >60 mL/min (ref >60)
Glucose, Bld: 107 mg/dL — ABNORMAL HIGH (ref 70–99)
Potassium: 4.8 mmol/L (ref 3.5–5.1)
Sodium: 140 mmol/L (ref 135–145)

## 2019-09-19 LAB — PHENYTOIN LEVEL, TOTAL: Phenytoin Lvl: 4 ug/mL — ABNORMAL LOW (ref 10.0–20.0)

## 2019-09-19 MED ORDER — PHENYTOIN SODIUM EXTENDED 200 MG PO CAPS: 200.0000 mg | ORAL_CAPSULE | Freq: Two times a day (BID) | ORAL | 0 refills | Status: DC

## 2019-09-19 MED ORDER — SODIUM CHLORIDE (PF) 0.9 % IJ SOLN
INTRAMUSCULAR | Status: AC
  Filled 2019-09-19: qty 50

## 2019-09-19 MED ORDER — IOHEXOL 350 MG/ML SOLN
80.0000 mL | Freq: Once | INTRAVENOUS | Status: AC | PRN
  Administered 2019-09-19: 100 mL via INTRAVENOUS

## 2019-09-19 NOTE — ED Triage Notes (Signed)
Pt presents with c/o left side facial and arm numbness. Pt reports that his symptoms started approx 11 days ago. Pt does have some noted stuttering and says that this is not normal for him but that he has been experiencing this all since Sunday (2/21). Pt reports on Tuesday of last week, he also had his COVID vaccine. No other neuro deficits noted. Pt is able to appropriately answer all questions.

## 2019-09-19 NOTE — Discharge Instructions (Signed)
Please increase your phenytoin to 200 mg twice daily.  You should call Dr. Renda Rolls office tomorrow to schedule an appointment for follow-up in the next 5 to 7 days.  Please monitor your symptoms closely.  If you have any persistence of your symptoms or if your symptoms worsen please return the emergency department immediately.

## 2019-09-19 NOTE — ED Notes (Signed)
Dr.Lockwood at bedside  

## 2019-09-19 NOTE — ED Provider Notes (Signed)
[name] DEPT Provider Note   CSN: [id number] Arrival date & time: 09/19/19  1439     History Chief Complaint  Patient presents with  . Numbness    [name] is a 59 y.o. male.  HPI   59 year old male with a history of malignant meningioma s/p craniotomy and resection, seizures, who presents to the emergency department today complaining of numbness. Pt states that he has had intermittent numbness/tingling to the left hand. He also has numbness and tingling to he left side of his face.  He also reports some neck pain associated with this. Rates pain 5-9/10. These symptoms started about a week ago. He denies weakness to the LUE. States the symptoms improve after he takes his medication. Denies any difficulty with word finding. Denies worsening LLE numbness/weakness. He has had a mild left sided headache which is normal for him. He states that the symptoms occur for short periods of time and last for about 30-40 seconds at a time.   He denies any missed doses of his seizure medications. He states that his last seizure was a few weeks ago. He states that he is stuttering a little bit currently because he is nervous. He has a history of the same.  Oncologist: Dr. Cecil Cobbs  Past Medical History:  Diagnosis Date  . Brain tumor ([name]) 02/20/2013   brain tumor removed in March 2014, Dr Donald Pore  . Dizziness   . Enlarged prostate   . Headache(784.0)    scattered  . Meningioma ([name])   . Seizures [name])     Patient Active Problem List   Diagnosis Date Noted  . 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[name]) 03/07/2019  . Seizures ([place]) 01/31/2019  . Meningioma, recurrent of brain ([name]) 01/31/2019  . Meningioma ([name]) 01/03/2019  . Brain tumor ([name]) 12/27/2018  . Diffuse idiopathic skeletal hyperostosis 06/05/2013    Past Surgical History:  Procedure Laterality Date  . APPLICATION OF CRANIAL NAVIGATION N/A 01/10/2019   Procedure: APPLICATION  OF CRANIAL NAVIGATION;  Surgeon: Erline Levine, MD;  Location: [facility];  Service: Neurosurgery;  Laterality: N/A;  . CATARACT EXTRACTION Right   . CRANIOTOMY Right 11/06/2012   Procedure: CRANIOTOMY TUMOR EXCISION;  Surgeon: Erline Levine, MD;  Location: [facility];  Service: Neurosurgery;  Laterality: Right;  Right Parasagittal craniotomy for meningioma with Stealth  . CRANIOTOMY Right 01/10/2019   Procedure: Right Parasagittal Craniotomy for Tumor;  Surgeon: Erline Levine, MD;  Location: [name];  Service: Neurosurgery;  Laterality: Right;  Right parasagittal craniotomy for tumor  . ESOPHAGOGASTRODUODENOSCOPY    . right knee arthroscopy         Family History  Problem Relation Age of Onset  . Hypertension Mother   . Hypertension Father     Social History   Tobacco Use  . Smoking status: Former Smoker    Types: Cigarettes    Quit date: 01/29/2019    Years since quitting: 0.6  . Smokeless tobacco: Never Used  . Tobacco comment: hasn't smoked in a week  Substance Use Topics  . Alcohol use: Yes    Alcohol/week: 8.0 standard drinks    Types: 6 Cans of beer, 2 Shots of liquor per week    Comment: occasionally  . Drug use: No    Home Medications Prior to Admission medications   Medication Sig Start Date End Date Taking? Authorizing Provider  aspirin EC 81 MG EC tablet Take 1 tablet (81 mg total) by mouth daily. 01/14/19  Yes Erline Levine, MD  clonazePAM (KLONOPIN) 0.5 MG tablet Take 1 tablet (0.5 mg total) by mouth 2 (two) times daily. 08/20/19  Yes Vaslow, Acey Lav, MD  cyclobenzaprine (FLEXERIL) 10 MG tablet Take 1 tablet (10 mg total) by mouth 2 (two) times daily as needed for muscle spasms. 07/23/19  Yes Newlin, Charlane Ferretti, MD  FIBER ADULT GUMMIES PO Take 1 tablet by mouth daily.   Yes [provider]  furosemide (LASIX) 20 MG tablet Take 1 tablet (20 mg total) by mouth daily. Prn swelling.  Use sparingly Patient taking differently: Take 20 mg by mouth daily as needed for  fluid. Prn swelling.  Use sparingly 08/22/19  Yes McClung, Angela M, PA-C  lamoTRIgine (LAMICTAL) 100 MG tablet TAKE 1 TABLET(100 MG) BY MOUTH TWICE DAILY Patient taking differently: Take 100 mg by mouth 2 (two) times daily.  06/25/19  Yes Vaslow, Acey Lav, MD  levETIRAcetam (KEPPRA) 500 MG tablet TAKE 2 TABLETS(1000 MG) BY MOUTH TWICE DAILY Patient taking differently: Take 1,000 mg by mouth 2 (two) times daily.  09/02/19  Yes Vaslow, Acey Lav, MD  LORazepam (ATIVAN) 1 MG tablet Take 1 tablet (1 mg total) by mouth every 8 (eight) hours as needed for seizure (for breakthrough seizures). 07/23/19  Yes Vaslow, Acey Lav, MD  Melatonin 5 MG CHEW Chew 3 tablets by mouth at bedtime as needed (sleep).   Yes [provider]  meloxicam (MOBIC) 7.5 MG tablet Take 1 tablet (7.5 mg total) by mouth daily. 09/08/19  Yes Augusto Gamble B, NP  naproxen sodium (ALEVE) 220 MG tablet Take 440 mg by mouth as needed (pain or headache).   Yes [provider]  ondansetron (ZOFRAN-ODT) 4 MG disintegrating tablet Take 1 tablet (4 mg total) by mouth every 8 (eight) hours as needed for nausea or vomiting. 09/08/19  Yes Burky, Lanelle Bal B, NP  vitamin C (ASCORBIC ACID) 500 MG tablet Take 500 mg by mouth daily.   Yes [provider]  olopatadine (PATANOL) 0.1 % ophthalmic solution Place 1 drop into both eyes 2 (two) times daily. Patient not taking: Reported on 09/19/2019 08/13/19   Vanessa Kick, MD  phenytoin (DILANTIN) 200 MG ER capsule Take 1 capsule (200 mg total) by mouth 2 (two) times daily. 09/19/19 10/19/19  Jacquelene Kopecky S, PA-C  prochlorperazine (COMPAZINE) 10 MG tablet Take 1 tablet (10 mg total) by mouth every 6 (six) hours as needed for nausea or vomiting. Patient not taking: Reported on 09/19/2019 03/08/19   Kyung Rudd, MD  sucralfate (CARAFATE) 1 g tablet Take 1 tablet (1 g total) by mouth 4 (four) times daily -  with meals and at bedtime for 14 days. 07/23/19 08/06/19  Charlott Rakes, MD  triamcinolone  cream (KENALOG) 0.1 % Apply 1 application topically 2 (two) times daily. Patient not taking: Reported on 09/19/2019 02/25/19   Charlott Rakes, MD  cetirizine (ZYRTEC) 10 MG tablet Take 1 tablet (10 mg total) by mouth daily. 08/08/19 09/08/19  Argentina Donovan, PA-C    Allergies    Patient has no known allergies.  Review of Systems   Review of Systems  Constitutional: Negative for fever.  HENT: Negative for ear pain and sore throat.   Eyes: Negative for visual disturbance.  Respiratory: Negative for cough and shortness of breath.   Cardiovascular: Negative for chest pain.  Gastrointestinal: Negative for abdominal pain, constipation, diarrhea, nausea and vomiting.  Genitourinary: Negative for dysuria and hematuria.  Musculoskeletal: Negative for back pain.  Skin: Negative for rash.  Neurological: Positive for numbness and headaches. Negative for weakness.  All other systems reviewed and are negative.   Physical Exam Updated Vital Signs BP 121/80   Pulse 79   Temp 98 F (36.7 C) (Oral)   Resp 16   SpO2 96%   Physical Exam Vitals and nursing note reviewed.  Constitutional:      Appearance: He is well-developed.  HENT:     Head: Normocephalic and atraumatic.  Eyes:     Conjunctiva/sclera: Conjunctivae normal.  Neck:     Comments: No carotid bruit bilaterally. Cardiovascular:     Rate and Rhythm: Normal rate and regular rhythm.     Heart sounds: Normal heart sounds. No murmur.  Pulmonary:     Effort: Pulmonary effort is normal. No respiratory distress.     Breath sounds: Normal breath sounds. No wheezing, rhonchi or rales.  Abdominal:     General: Bowel sounds are normal. There is no distension.     Palpations: Abdomen is soft.     Tenderness: There is no abdominal tenderness. There is no guarding or rebound.  Musculoskeletal:     Cervical back: Neck supple.  Skin:    General: Skin is warm and dry.  Neurological:     Mental Status: He is alert.     Comments: Mental  Status:  Alert, thought content appropriate, able to give a coherent history. Speech fluent without evidence of aphasia. Able to follow 2 step commands without difficulty.  Cranial Nerves:  II:   pupils equal, round, reactive to light III,IV, VI: ptosis not present, extra-ocular motions intact bilaterally  V,VII: smile symmetric, facial light touch sensation equal VIII: hearing grossly normal to voice  X: uvula elevates symmetrically  XI: bilateral shoulder shrug symmetric and strong XII: midline tongue extension without fassiculations Motor:  Normal tone. 5/5 strength of BUE and BLE major muscle groups including strong and equal grip strength and dorsiflexion/plantar flexion Sensory: Decreased sensation to the first through fourth digits on the left hand.  Sensation otherwise normal to the left upper extremity.  Decreased sensation to the left lower extremity which is chronic and unchanged.     ED Results / Procedures / Treatments   Labs (all labs ordered are listed, but only abnormal results are displayed) Labs Reviewed  BASIC METABOLIC PANEL - Abnormal; Notable for the following components:      Result Value   Glucose, Bld 107 (*)    All other components within normal limits  PHENYTOIN LEVEL, TOTAL - Abnormal; Notable for the following components:   Phenytoin Lvl 4.0 (*)    All other components within normal limits  CBC WITH DIFFERENTIAL/PLATELET    EKG None  Radiology CT Angio Neck W and/or Wo Contrast  Result Date: 09/19/2019 CLINICAL DATA:  Carotid artery stenosis. Left-sided paresthesia and neck pain. Left facial arm numbness. EXAM: CT ANGIOGRAPHY NECK TECHNIQUE: Multidetector CT imaging of the neck was performed using the standard protocol during bolus administration of intravenous contrast. Multiplanar CT image reconstructions and MIPs were obtained to evaluate the vascular anatomy. Carotid stenosis measurements (when applicable) are obtained utilizing NASCET criteria,  using the distal internal carotid diameter as the denominator. CONTRAST:  159mL OMNIPAQUE IOHEXOL 350 MG/ML SOLN COMPARISON:  None. FINDINGS: Aortic arch: Aortic atherosclerosis.  No aneurysm or dissection. Right carotid system: Common carotid artery widely patent to the bifurcation region. Carotid bifurcation is free of atherosclerotic disease. No stenosis or irregularity. Cervical ICA widely patent. Left carotid system: Common carotid artery widely patent to  the bifurcation. Carotid bifurcation is free of atherosclerotic disease. No stenosis or irregularity. Cervical ICA is widely patent. Vertebral arteries: Vertebral artery origins are widely patent. No proximal subclavian stenosis. Both vertebral arteries are widely patent through the cervical region to the foramen magnum. Skeleton: Ordinary degenerative spondylosis. Other neck: No mass or lymphadenopathy. Upper chest: Normal The examination does extend to the circle-of-[place]is region. No proximal intracranial vascular abnormality is demonstrated. IMPRESSION: Normal examination. No stenotic disease. No demonstration of soft or calcified plaque. Electronically Signed   By: Nelson Chimes M.D.   On: 09/19/2019 18:23    Procedures Procedures (including critical care time)  Medications Ordered in ED Medications  iohexol (OMNIPAQUE) 350 MG/ML injection 80 mL (100 mLs Intravenous Contrast Given 09/19/19 1807)  sodium chloride (PF) 0.9 % injection (  Given by Other 09/19/19 1833)    ED Course  I have reviewed the triage vital signs and the nursing notes.  Pertinent labs & imaging results that were available during my care of the patient were reviewed by me and considered in my medical decision making (see chart for details).    MDM Rules/Calculators/A&P                      59 year old male with a history of malignant meningioma s/p craniotomy and resection, seizures presenting for evaluation of intermittent episodes of left arm tingling and left facial  tingling/spasm ongoing for the last week.  Neurologic exam is overall benign.  Will consult his neuro oncologist.  4:15 PM CONSULT with Dr. Mickeal Skinner, who is the patient's oncologist. He recommends checking a phenytoin level. He suspects that patient is having breakthough seizures. He recommends increasing that patient's phenytoin to 200 mg BID.  He will f/u with the patient in the office.   Reviewed labs CBC was without leukocytosis or anemia BMP does not show any acute electrolyte derangements.  Normal kidney function Phenytoin level is low but remains stable compared to prior.  CT angio neck is normal. No stenotic disease. No demonstration of soft or calcified plaque  Reassessed patient.  He is in no acute distress.  We discussed the work-up and plan for increasing his phenytoin to 200 mg twice daily and following up with his neuro oncologist next week.  Have advised on specific return precautions.  He voices understanding of the plan and reasons to return.  All questions answered.  Patient stable for discharge.  Patient seen in conjunction with Dr. Vanita Panda who personally evaluated the patient and is in agreement with the plan.  Final Clinical Impression(s) / ED Diagnoses Final diagnoses:  Seizures ([place])  Neck pain    Rx / DC Orders ED Discharge Orders         Ordered    phenytoin (DILANTIN) 200 MG ER capsule  2 times daily     09/19/19 1913           Bishop Dublin 09/19/19 1913    Carmin Muskrat, MD 09/20/19 [phone]

## 2019-09-23 ENCOUNTER — Other Ambulatory Visit: Payer: Self-pay

## 2019-09-23 ENCOUNTER — Ambulatory Visit: Payer: Medicaid Other | Attending: Internal Medicine

## 2019-09-23 DIAGNOSIS — R2689 Other abnormalities of gait and mobility: Secondary | ICD-10-CM | POA: Insufficient documentation

## 2019-09-23 DIAGNOSIS — M6281 Muscle weakness (generalized): Secondary | ICD-10-CM | POA: Diagnosis not present

## 2019-09-23 NOTE — Therapy (Signed)
[facility] [address] [place], [place], [address] Phone: [phone]   Fax:  [phone]  Physical Therapy Treatment  Patient Details  Name: [name] MRN: [id number] Date of Birth: 09/06/1960 Referring Provider (PT): Cecil Cobbs   Encounter Date: 09/23/2019  PT End of Session - 09/23/19 0716    Visit Number  9    Number of Visits  12    Date for PT Re-Evaluation  10/13/19    Authorization Type  Medicaid 3 visits 06/03/11/02/2019, 12/10-12/30/20 6 units, 1/7-1/27/2021 3 visits, 1/29-3/4 10 visits, 3 visits 3/8-3/28    Authorization - Visit Number  1    Authorization - Number of Visits  3    PT Start Time  0713    PT Stop Time  [id number]    PT Time Calculation (min)  44 min    Activity Tolerance  Patient tolerated treatment well;No increased pain    Behavior During Therapy  WFL for tasks assessed/performed       Past Medical History:  Diagnosis Date  . Brain tumor ([place]) 02/20/2013   brain tumor removed in March 2014, Dr Donald Pore  . Dizziness   . Enlarged prostate   . Headache(784.0)    scattered  . Meningioma ([place])   . Seizures ([place])     Past Surgical History:  Procedure Laterality Date  . APPLICATION OF CRANIAL NAVIGATION N/A 01/10/2019   Procedure: APPLICATION OF CRANIAL NAVIGATION;  Surgeon: Erline Levine, MD;  Location: [facility];  Service: Neurosurgery;  Laterality: N/A;  . CATARACT EXTRACTION Right   . CRANIOTOMY Right 11/06/2012   Procedure: CRANIOTOMY TUMOR EXCISION;  Surgeon: Erline Levine, MD;  Location: [facility];  Service: Neurosurgery;  Laterality: Right;  Right Parasagittal craniotomy for meningioma with Stealth  . CRANIOTOMY Right 01/10/2019   Procedure: Right Parasagittal Craniotomy for Tumor;  Surgeon: Erline Levine, MD;  Location: [facility];  Service: Neurosurgery;  Laterality: Right;  Right parasagittal craniotomy for tumor  . ESOPHAGOGASTRODUODENOSCOPY    . right knee arthroscopy       There were no vitals filed for this visit.  Subjective Assessment - 09/23/19 0713    Subjective  Pt was in ED on 3/4 due to breakthrough seizures. They adjusted one of his seizure meds that he has to pick up today. Also still having some pain in right low back/hip. Was given something for pain for that but hasn't changed anything. Has been wearing brace more as felt foot turning back in.    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 History of right THA on 2019.    Patient Stated Goals  Pt would like to be able to elimate his cane use.    Currently in Pain?  Yes    Pain Score  0-No pain   8/10 when gets up.   Pain Location  Hip    Pain Orientation  Right    Pain Descriptors / Indicators  Shooting    Pain Type  Acute pain    Pain Onset  More than a month ago    Pain Frequency  Intermittent                       [facility] Adult PT Treatment/Exercise - 09/23/19 0717      Ambulation/Gait   Ambulation/Gait  Yes    Ambulation/Gait Assistance  5: Supervision  Ambulation Distance (Feet)  345 Feet    Assistive device  None   left AFO   Gait Pattern  Step-through pattern    Ambulation Surface  Level;Indoor      Neuro Re-ed    Neuro Re-ed Details   Tapping 2nd step at stairs with left foot x 10, step-ups on bottom 6" step x 10 without UE support for first half then with finger tip support on right for last half.  Verbal cues to tighten gluts and control weight shift to left. Quick step at times. Then repeated on right leg x 10. Alternating toe taps on soccerball in // bars x 15 each leg with verbal and tactile cues to control weight shift, reciprocal steps over 3 foams x 6 laps, stepping over bolster and back with left foot x 10 with cues to control step. Pt had more difficulty with stepping back over bolster.      Manual Therapy    Manual therapy comments  Joint mobs to thoracic and lumbar spine on right as well as rib springing 3 bouts 20 sec grade 3. Central and unilateral mobs perfomred right T5-L4. Can tell PT worked on it after. Hypomobile throughout.             PT Education - 09/23/19 1702    Education Details  PT discussed possible referral from PCP regarding back pain if continues to ortho PT.    Person(s) Educated  Patient    Methods  Explanation    Comprehension  Verbalized understanding       PT Short Term Goals - 09/13/19 1123      PT SHORT TERM GOAL #1   Title  STG=LTG        PT Long Term Goals - 09/13/19 1123      PT LONG TERM GOAL #1   Title  Pt will increase FGA from 21/30 to >23/30 for improved balance and gait safety.    Baseline  21/30 on 09/13/19    Time  4    Period  Weeks    Status  New    Target Date  10/13/19      PT LONG TERM GOAL #2   Title  Pt will ambulate >700' on varied surfaces independently without AD for improved community mobility.    Baseline  Pt ambulated 575' without AD today at supervision level. Currently using SPC outside of home.    Time  4    Period  Weeks    Status  Revised    Target Date  10/13/19            Plan - 09/23/19 1703    Clinical Impression Statement  Pt reporting continued  pain in right side/back and did go to ED last week. Pt is hypomobile throughout thoracic and lumbar spine. PT recommended referral for ortho PT for back if continues to PCP. Pt was wearing AFO today and foot in straighter position at heel strike.    Examination-Activity Limitations  Locomotion Level;Stairs;Dressing    Examination-Participation Restrictions  Community Activity    Stability/Clinical Decision Making  Evolving/Moderate complexity    Rehab Potential  Good    PT Frequency  1x / week    PT Duration  4 weeks    PT Treatment/Interventions  ADLs/Self Care Home Management;DME Instruction;Gait training;Stair training;Functional mobility  training;Therapeutic activities;Patient/family education;Neuromuscular re-education;Balance training;Therapeutic exercise;Orthotic Fit/Training;Manual techniques;Passive range of motion;Aquatic Therapy    PT Next Visit Plan  Gait training with new AFO if he brings (  has been choosing not to wear it and doing okay without) on varied surfaces, LE strengthening and balance reactions. Continue to work on improving left weight shift. Focus on left hip/knee flexion strengthening as well as PF strengthening.    Consulted and Agree with Plan of Care  Patient       Patient will benefit from skilled therapeutic intervention in order to improve the following deficits and impairments:  Abnormal gait, Decreased activity tolerance, Decreased balance, Difficulty walking, Decreased strength, Impaired sensation, Postural dysfunction, Impaired flexibility, Decreased mobility, Decreased range of motion  Visit Diagnosis: Other abnormalities of gait and mobility  Muscle weakness (generalized)     Problem List Patient Active Problem List   Diagnosis Date Noted  . 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[place]) 03/07/2019  . Seizures ([place]) 01/31/2019  . Meningioma, recurrent of brain ([place]) 01/31/2019  . Meningioma ([place]) 01/03/2019  . Brain tumor ([place]) 12/27/2018  . Diffuse idiopathic skeletal hyperostosis 06/05/2013    Electa Sniff, PT, DPT, NCS 09/23/2019, 5:06 PM  [facility] [address] [place], [place], [address] Phone: [phone]   Fax:  [phone]  Name: [name] MRN: [id number] Date of Birth: 05-05-1961

## 2019-09-25 ENCOUNTER — Other Ambulatory Visit: Payer: Self-pay

## 2019-09-25 ENCOUNTER — Encounter: Payer: Self-pay | Admitting: Family Medicine

## 2019-09-25 ENCOUNTER — Ambulatory Visit: Payer: Medicaid Other | Attending: Family Medicine | Admitting: Family Medicine

## 2019-09-25 DIAGNOSIS — M62838 Other muscle spasm: Secondary | ICD-10-CM

## 2019-09-25 MED ORDER — MELOXICAM 7.5 MG PO TABS: 7.5000 mg | ORAL_TABLET | Freq: Every day | ORAL | 1 refills | Status: DC

## 2019-09-25 NOTE — Progress Notes (Signed)
Patient has been called and DOB has been verified. Patient has been screened and transferred to PCP to start phone visit.   Patient is having pain in his right side. Patient is needing refills on medications.

## 2019-09-25 NOTE — Progress Notes (Signed)
Virtual Visit via Telephone Note  I connected with Joshua [name], on 09/25/2019 at 8:39 AM by telephone due to the COVID-19 pandemic and verified that I am speaking with the correct person using two identifiers.   Consent: I discussed the limitations, risks, security and privacy concerns of performing an evaluation and management service by telephone and the availability of in person appointments. I also discussed with the patient that there may be a patient responsible charge related to this service. The patient expressed understanding and agreed to proceed.   Location of Patient: Home  Location of Provider: Clinic   Persons participating in Telemedicine visit: Joanthan Bada [place]-CMA Dr. Margarita Rana     History of Present Illness: Joshua [name] is a 59 year old right-handed male male with a history of meningioma status post resection in 2014 hospitalized at [facility] from 01/03/2019 through 01/14/2019 for recurrent meningioma status post resection(the last of which was in 12/2018), seizureshere for a follow-up visit.  He complains of right side pain which is similar to pain he had prior to previous right hip surgery. This started a month ago for which he was seen at urgent care.  He had a UA which reveals trace hemoglobin.  Diagnosed with muscle spasm and prescribed meloxicam.  Meloxicam, Flexeril and Ibuprofen knock him out but they provide some relief.  He was seen at rehab and informed he might need PT as he was found to be 'tight' in his right flank and right rib cage area.   Past Medical History:  Diagnosis Date  . Brain tumor ([place]) 02/20/2013   brain tumor removed in March 2014, Dr Donald Pore  . Dizziness   . Enlarged prostate   . Headache(784.0)    scattered  . Meningioma ([place])   . Seizures ([place])    No Known Allergies  Current Outpatient Medications on File Prior to Visit  Medication Sig Dispense Refill  . aspirin EC 81 MG EC tablet Take 1  tablet (81 mg total) by mouth daily. 30 tablet 3  . clonazePAM (KLONOPIN) 0.5 MG tablet Take 1 tablet (0.5 mg total) by mouth 2 (two) times daily. 60 tablet 1  . cyclobenzaprine (FLEXERIL) 10 MG tablet Take 1 tablet (10 mg total) by mouth 2 (two) times daily as needed for muscle spasms. 60 tablet 3  . FIBER ADULT GUMMIES PO Take 1 tablet by mouth daily.    Marland Kitchen lamoTRIgine (LAMICTAL) 100 MG tablet TAKE 1 TABLET(100 MG) BY MOUTH TWICE DAILY (Patient taking differently: Take 100 mg by mouth 2 (two) times daily. ) 60 tablet 2  . levETIRAcetam (KEPPRA) 500 MG tablet TAKE 2 TABLETS(1000 MG) BY MOUTH TWICE DAILY (Patient taking differently: Take 1,000 mg by mouth 2 (two) times daily. ) 180 tablet 1  . LORazepam (ATIVAN) 1 MG tablet Take 1 tablet (1 mg total) by mouth every 8 (eight) hours as needed for seizure (for breakthrough seizures). 12 tablet 0  . Melatonin 5 MG CHEW Chew 3 tablets by mouth at bedtime as needed (sleep).    . meloxicam (MOBIC) 7.5 MG tablet Take 1 tablet (7.5 mg total) by mouth daily. 20 tablet 0  . naproxen sodium (ALEVE) 220 MG tablet Take 440 mg by mouth as needed (pain or headache).    . ondansetron (ZOFRAN-ODT) 4 MG disintegrating tablet Take 1 tablet (4 mg total) by mouth every 8 (eight) hours as needed for nausea or vomiting. 12 tablet 0  . phenytoin (DILANTIN) 200 MG ER capsule Take 1  capsule (200 mg total) by mouth 2 (two) times daily. 60 capsule 0  . vitamin C (ASCORBIC ACID) 500 MG tablet Take 500 mg by mouth daily.    . furosemide (LASIX) 20 MG tablet Take 1 tablet (20 mg total) by mouth daily. Prn swelling.  Use sparingly (Patient not taking: Reported on 09/25/2019) 30 tablet 1  . olopatadine (PATANOL) 0.1 % ophthalmic solution Place 1 drop into both eyes 2 (two) times daily. (Patient not taking: Reported on 09/19/2019) 5 mL 0  . prochlorperazine (COMPAZINE) 10 MG tablet Take 1 tablet (10 mg total) by mouth every 6 (six) hours as needed for nausea or vomiting. (Patient not  taking: Reported on 09/19/2019) 30 tablet 1  . sucralfate (CARAFATE) 1 g tablet Take 1 tablet (1 g total) by mouth 4 (four) times daily -  with meals and at bedtime for 14 days. 56 tablet 0  . triamcinolone cream (KENALOG) 0.1 % Apply 1 application topically 2 (two) times daily. (Patient not taking: Reported on 09/19/2019) 30 g 1  . [DISCONTINUED] cetirizine (ZYRTEC) 10 MG tablet Take 1 tablet (10 mg total) by mouth daily. 30 tablet 11   No current facility-administered medications on file prior to visit.    Observations/Objective: Awake, alert, oriented x3 Not in acute distress On twisting of his trunk, pain is reproducible and deep palpation reproduces the pain  Assessment and Plan: 1. Muscle spasm He did have slight hematuria from his UA at urgent care on 08/2018 His symptoms sound like muscle spasms but also I will proceed with PT Advised that if symptoms persist he needs to get in touch with the clinic as we would need a repeat UA and possibly nephrolithiasis work-up  Advised against using both Ibuprofen and Meloxicam as they are both NSAIDS but to use one or the other. - Ambulatory referral to Physical Therapy   Follow Up Instructions: Keep previously scheduled appointment   I discussed the assessment and treatment plan with the patient. The patient was provided an opportunity to ask questions and all were answered. The patient agreed with the plan and demonstrated an understanding of the instructions.   The patient was advised to call back or seek an in-person evaluation if the symptoms worsen or if the condition fails to improve as anticipated.     I provided  12 minutes total of non-face-to-face time during this encounter including median intraservice time, reviewing previous notes, investigations, ordering medications, medical decision making, coordinating care and patient verbalized understanding at the end of the visit.     Charlott Rakes, MD, FAAFP. [facility] and [place], [place]   09/25/2019, 8:39 AM

## 2019-09-27 ENCOUNTER — Telehealth: Payer: Self-pay | Admitting: *Deleted

## 2019-09-27 ENCOUNTER — Telehealth: Payer: Self-pay | Admitting: Family Medicine

## 2019-09-27 NOTE — Telephone Encounter (Signed)
Pt called [facility] yesterday wanting to know why he is taking Meloxicam.  However, pt stated he had gotten an answer from his wife for clarification of this medication.  No further issue.

## 2019-09-27 NOTE — Telephone Encounter (Signed)
Patient called to inform pcp that the muscle relaxer's for his side are not working. Patient stated that he wants to see someone for further observation since the medications are not working. Patient stated that he doesn't want to be put on a higher dose of medication he want to know what's going on with him so he can fix the problem. Please follow up at your earliest convenience.

## 2019-09-27 NOTE — Telephone Encounter (Signed)
Patient has a upcoming appointment with PCP, this will be discussed then.

## 2019-10-01 ENCOUNTER — Ambulatory Visit: Payer: Medicaid Other | Attending: Family Medicine | Admitting: Family Medicine

## 2019-10-01 ENCOUNTER — Telehealth: Payer: Self-pay

## 2019-10-01 ENCOUNTER — Other Ambulatory Visit: Payer: Self-pay

## 2019-10-01 ENCOUNTER — Encounter: Payer: Self-pay | Admitting: Family Medicine

## 2019-10-01 ENCOUNTER — Other Ambulatory Visit: Payer: Self-pay | Admitting: Internal Medicine

## 2019-10-01 VITALS — BP 142/88 | HR 86 | Ht 69.0 in | Wt 218.0 lb

## 2019-10-01 DIAGNOSIS — Z79899 Other long term (current) drug therapy: Secondary | ICD-10-CM | POA: Diagnosis not present

## 2019-10-01 DIAGNOSIS — R569 Unspecified convulsions: Secondary | ICD-10-CM | POA: Insufficient documentation

## 2019-10-01 DIAGNOSIS — Z791 Long term (current) use of non-steroidal anti-inflammatories (NSAID): Secondary | ICD-10-CM | POA: Diagnosis not present

## 2019-10-01 DIAGNOSIS — Z0000 Encounter for general adult medical examination without abnormal findings: Secondary | ICD-10-CM

## 2019-10-01 DIAGNOSIS — Z7982 Long term (current) use of aspirin: Secondary | ICD-10-CM | POA: Insufficient documentation

## 2019-10-01 DIAGNOSIS — Z125 Encounter for screening for malignant neoplasm of prostate: Secondary | ICD-10-CM | POA: Diagnosis not present

## 2019-10-01 LAB — POCT URINALYSIS DIP (CLINITEK)
Bilirubin, UA: NEGATIVE
Blood, UA: NEGATIVE
Glucose, UA: NEGATIVE mg/dL
Ketones, POC UA: NEGATIVE mg/dL
Leukocytes, UA: NEGATIVE
Nitrite, UA: NEGATIVE
Spec Grav, UA: 1.025 (ref 1.010–1.025)
Urobilinogen, UA: 0.2 E.U./dL
pH, UA: 5.5 (ref 5.0–8.0)

## 2019-10-01 MED ORDER — LORAZEPAM 1 MG PO TABS: 1.0000 mg | ORAL_TABLET | Freq: Three times a day (TID) | ORAL | 0 refills | Status: DC | PRN

## 2019-10-01 NOTE — Progress Notes (Signed)
Subjective:  Patient ID: Joshua [name], male    DOB: April 07, 1961  Age: 59 y.o. MRN: [id number]  CC: Annual Exam   HPI Joshua [name] is a89 year old right-handed male male with a history of meningioma status post resection in 2014 hospitalized at [facility] from 01/03/2019 through 01/14/2019 for recurrent meningioma status post resection(the last of which was in 12/2018), seizureshere for an annual exam. Colonoscopy - upcoming appt later this month He would like his PSA checked. Goes to the mall every other day to walk He has R side pain which is sharp and is undergoing PT; previous UA at urgent care revealed trace hematuria; UA today is normal  Past Medical History:  Diagnosis Date  . Brain tumor (Joshua [name]) 02/20/2013   brain tumor removed in March 2014, Dr Donald Pore  . Dizziness   . Enlarged prostate   . Headache(784.0)    scattered  . Meningioma (Joshua [name])   . Seizures (Joshua [name])     Past Surgical History:  Procedure Laterality Date  . APPLICATION OF CRANIAL NAVIGATION N/A 01/10/2019   Procedure: APPLICATION OF CRANIAL NAVIGATION;  Surgeon: Erline Levine, MD;  Location: [place];  Service: Neurosurgery;  Laterality: N/A;  . CATARACT EXTRACTION Right   . CRANIOTOMY Right 11/06/2012   Procedure: CRANIOTOMY TUMOR EXCISION;  Surgeon: Erline Levine, MD;  Location: [place] [facility]UR[facility];  Service: Neurosurgery;  Laterality: Right;  Right Parasagittal craniotomy for meningioma with Stealth  . CRANIOTOMY Right 01/10/2019   Procedure: Right Parasagittal Craniotomy for Tumor;  Surgeon: Erline Levine, MD;  Location: [place];  Service: Neurosurgery;  Laterality: Right;  Right parasagittal craniotomy for tumor  . ESOPHAGOGASTRODUODENOSCOPY    . right knee arthroscopy      Family History  Problem Relation Age of Onset  . Hypertension Mother   . Hypertension Father     No Known Allergies  Outpatient Medications Prior to Visit  Medication Sig Dispense Refill  . aspirin EC 81 MG EC tablet Take 1  tablet (81 mg total) by mouth daily. 30 tablet 3  . clonazePAM (KLONOPIN) 0.5 MG tablet Take 1 tablet (0.5 mg total) by mouth 2 (two) times daily. 60 tablet 1  . cyclobenzaprine (FLEXERIL) 10 MG tablet Take 1 tablet (10 mg total) by mouth 2 (two) times daily as needed for muscle spasms. 60 tablet 3  . FIBER ADULT GUMMIES PO Take 1 tablet by mouth daily.    . furosemide (LASIX) 20 MG tablet Take 1 tablet (20 mg total) by mouth daily. Prn swelling.  Use sparingly 30 tablet 1  . lamoTRIgine (LAMICTAL) 100 MG tablet TAKE 1 TABLET(100 MG) BY MOUTH TWICE DAILY (Patient taking differently: Take 100 mg by mouth 2 (two) times daily. ) 60 tablet 2  . levETIRAcetam (KEPPRA) 500 MG tablet TAKE 2 TABLETS(1000 MG) BY MOUTH TWICE DAILY (Patient taking differently: Take 1,000 mg by mouth 2 (two) times daily. ) 180 tablet 1  . LORazepam (ATIVAN) 1 MG tablet Take 1 tablet (1 mg total) by mouth every 8 (eight) hours as needed for seizure (for breakthrough seizures). 12 tablet 0  . Melatonin 5 MG CHEW Chew 3 tablets by mouth at bedtime as needed (sleep).    . meloxicam (MOBIC) 7.5 MG tablet Take 1 tablet (7.5 mg total) by mouth daily. 30 tablet 1  . naproxen sodium (ALEVE) 220 MG tablet Take 440 mg by mouth as needed (pain or headache).    . ondansetron (ZOFRAN-ODT) 4 MG disintegrating tablet Take 1 tablet (4 mg  total) by mouth every 8 (eight) hours as needed for nausea or vomiting. 12 tablet 0  . phenytoin (DILANTIN) 200 MG ER capsule Take 1 capsule (200 mg total) by mouth 2 (two) times daily. 60 capsule 0  . vitamin C (ASCORBIC ACID) 500 MG tablet Take 500 mg by mouth daily.    Marland Kitchen olopatadine (PATANOL) 0.1 % ophthalmic solution Place 1 drop into both eyes 2 (two) times daily. (Patient not taking: Reported on 09/19/2019) 5 mL 0  . prochlorperazine (COMPAZINE) 10 MG tablet Take 1 tablet (10 mg total) by mouth every 6 (six) hours as needed for nausea or vomiting. (Patient not taking: Reported on 09/19/2019) 30 tablet 1  .  sucralfate (CARAFATE) 1 g tablet Take 1 tablet (1 g total) by mouth 4 (four) times daily -  with meals and at bedtime for 14 days. 56 tablet 0  . triamcinolone cream (KENALOG) 0.1 % Apply 1 application topically 2 (two) times daily. (Patient not taking: Reported on 09/19/2019) 30 g 1   No facility-administered medications prior to visit.     ROS Review of Systems  Constitutional: Negative for activity change and appetite change.  HENT: Negative for sinus pressure and sore throat.   Eyes: Negative for visual disturbance.  Respiratory: Negative for cough, chest tightness and shortness of breath.   Cardiovascular: Negative for chest pain and leg swelling.  Gastrointestinal: Negative for abdominal distention, abdominal pain, constipation and diarrhea.  Endocrine: Negative.   Genitourinary: Negative for dysuria.  Musculoskeletal: Positive for gait problem. Negative for joint swelling and myalgias.  Skin: Negative for rash.  Allergic/Immunologic: Negative.   Neurological: Negative for weakness, light-headedness and numbness.  Psychiatric/Behavioral: Negative for dysphoric mood and suicidal ideas.    Objective:  BP (!) 163/77   Pulse 86   Ht 5\' 9"  (1.753 m)   Wt 218 lb (98.9 kg)   SpO2 98%   BMI 32.19 kg/m   BP/Weight 10/01/2019 09/19/2019 [id number]  Systolic BP [id number]  Diastolic BP 77 86 91  Wt. (Lbs) 218 - -  BMI 32.19 - -      Physical Exam Constitutional:      Appearance: He is well-developed.  HENT:     Head: Normocephalic and atraumatic.     Comments: Craniotomy scar which is healed    Right Ear: External ear normal.     Left Ear: External ear normal.  Eyes:     Conjunctiva/sclera: Conjunctivae normal.     Pupils: Pupils are equal, round, and reactive to light.  Neck:     Trachea: No tracheal deviation.  Cardiovascular:     Rate and Rhythm: Normal rate and regular rhythm.     Heart sounds: Normal heart sounds. No murmur.  Pulmonary:     Effort: Pulmonary  effort is normal. No respiratory distress.     Breath sounds: Normal breath sounds. No wheezing.  Chest:     Chest wall: No tenderness.  Abdominal:     General: Bowel sounds are normal.     Palpations: Abdomen is soft. There is no mass.     Tenderness: There is no abdominal tenderness.  Genitourinary:    Penis: Normal.      Testes: Normal.     Prostate: Normal.  Musculoskeletal:        General: No tenderness. Normal range of motion.     Cervical back: Normal range of motion and neck supple.  Skin:    General: Skin is warm and dry.  Neurological:  Mental Status: He is alert and oriented to person, place, and time.     Gait: Gait abnormal.     Deep Tendon Reflexes:     Reflex Scores:      Patellar reflexes are 3+ on the right side and 3+ on the left side. Psychiatric:        Mood and Affect: Mood normal.     CMP Latest Ref Rng & Units 09/19/2019 07/09/2019 07/04/2019  Glucose 70 - 99 mg/dL 107(H) 123(H) 99  BUN 6 - 20 mg/dL 13 10 10   Creatinine 0.61 - 1.24 mg/dL 1.04 0.88 0.90  Sodium 135 - 145 mmol/L 140 139 138  Potassium 3.5 - 5.1 mmol/L 4.8 4.0 4.0  Chloride 98 - 111 mmol/L 106 106 104  CO2 22 - 32 mmol/L 27 23 24   Calcium 8.9 - 10.3 mg/dL 9.3 9.1 9.3  Total Protein 6.5 - 8.1 g/dL - 6.2(L) -  Total Bilirubin 0.3 - 1.2 mg/dL - 0.3 -  Alkaline Phos 38 - 126 U/L - 68 -  AST 15 - 41 U/L - 20 -  ALT 0 - 44 U/L - 25 -    Lipid Panel  No results found for: CHOL, TRIG, HDL, CHOLHDL, VLDL, LDLCALC, LDLDIRECT  CBC    Component Value Date/Time   WBC 4.8 09/19/2019 1635   RBC 5.07 09/19/2019 1635   HGB 15.5 09/19/2019 1635   HCT 46.0 09/19/2019 1635   PLT 241 09/19/2019 1635   MCV 90.7 09/19/2019 1635   MCH 30.6 09/19/2019 1635   MCHC 33.7 09/19/2019 1635   RDW 12.5 09/19/2019 1635   LYMPHSABS 1.7 09/19/2019 1635   MONOABS 0.6 09/19/2019 1635   EOSABS 0.2 09/19/2019 1635   BASOSABS 0.1 09/19/2019 1635    No results found for: HGBA1C  Assessment & Plan:   1.  Annual physical exam Counseled on 150 minutes of exercise per week, healthy eating (including decreased daily intake of saturated fats, cholesterol, added sugars, sodium), STI prevention, routine healthcare maintenance. - POCT URINALYSIS DIP (CLINITEK)  2. Screening for prostate cancer - PSA, total and free   Return in about 6 months (around 04/02/2020) for medical conditions.  Charlott Rakes, MD, FAAFP. [facility] and [place], [place]   10/01/2019, 3:51 PM

## 2019-10-01 NOTE — Patient Instructions (Signed)

## 2019-10-01 NOTE — Telephone Encounter (Signed)
Received after hours message regarding pt needing refill on his Lorazepam 1 mg. Request forward to MD.  TCT patient about medication refill sent to his pharmacy for pick and he verbalized understanding.

## 2019-10-02 ENCOUNTER — Emergency Department (HOSPITAL_COMMUNITY): Payer: Medicaid Other

## 2019-10-02 ENCOUNTER — Other Ambulatory Visit: Payer: Self-pay

## 2019-10-02 ENCOUNTER — Encounter (HOSPITAL_COMMUNITY): Payer: Self-pay | Admitting: Radiology

## 2019-10-02 ENCOUNTER — Telehealth: Payer: Self-pay | Admitting: *Deleted

## 2019-10-02 ENCOUNTER — Emergency Department (HOSPITAL_COMMUNITY)
Admission: EM | Admit: 2019-10-02 | Discharge: 2019-10-02 | Disposition: A | Discharge status: Home / Self Care | Payer: Medicaid Other | Attending: Emergency Medicine | Admitting: Emergency Medicine

## 2019-10-02 ENCOUNTER — Telehealth: Payer: Self-pay

## 2019-10-02 ENCOUNTER — Encounter: Payer: Self-pay | Admitting: Family Medicine

## 2019-10-02 DIAGNOSIS — I880 Nonspecific mesenteric lymphadenitis: Secondary | ICD-10-CM | POA: Diagnosis not present

## 2019-10-02 DIAGNOSIS — Z87891 Personal history of nicotine dependence: Secondary | ICD-10-CM | POA: Diagnosis not present

## 2019-10-02 DIAGNOSIS — R1031 Right lower quadrant pain: Secondary | ICD-10-CM | POA: Diagnosis present

## 2019-10-02 DIAGNOSIS — R0600 Dyspnea, unspecified: Secondary | ICD-10-CM | POA: Diagnosis not present

## 2019-10-02 DIAGNOSIS — R0602 Shortness of breath: Secondary | ICD-10-CM | POA: Diagnosis not present

## 2019-10-02 DIAGNOSIS — R109 Unspecified abdominal pain: Secondary | ICD-10-CM | POA: Diagnosis not present

## 2019-10-02 DIAGNOSIS — R110 Nausea: Secondary | ICD-10-CM | POA: Diagnosis not present

## 2019-10-02 LAB — PSA, TOTAL AND FREE
PSA, Free Pct: 15 %
PSA, Free: 0.48 ng/mL
Prostate Specific Ag, Serum: 3.2 ng/mL (ref 0.0–4.0)

## 2019-10-02 LAB — BASIC METABOLIC PANEL
Anion gap: 12 (ref 5–15)
BUN: 11 mg/dL (ref 6–20)
CO2: 26 mmol/L (ref 22–32)
Calcium: 9.2 mg/dL (ref 8.9–10.3)
Chloride: 104 mmol/L (ref 98–111)
Creatinine, Ser: 0.98 mg/dL (ref 0.61–1.24)
GFR calc Af Amer: >60 mL/min (ref >60)
GFR calc non Af Amer: >60 mL/min (ref >60)
Glucose, Bld: 89 mg/dL (ref 70–99)
Potassium: 4 mmol/L (ref 3.5–5.1)
Sodium: 142 mmol/L (ref 135–145)

## 2019-10-02 LAB — URINALYSIS, ROUTINE W REFLEX MICROSCOPIC
Bilirubin Urine: NEGATIVE
Glucose, UA: NEGATIVE mg/dL
Hgb urine dipstick: NEGATIVE
Ketones, ur: NEGATIVE mg/dL
Leukocytes,Ua: NEGATIVE
Nitrite: NEGATIVE
Protein, ur: 30 mg/dL — AB
Specific Gravity, Urine: 1.029 (ref 1.005–1.030)
pH: 5 (ref 5.0–8.0)

## 2019-10-02 LAB — BRAIN NATRIURETIC PEPTIDE: B Natriuretic Peptide: 46.8 pg/mL (ref 0.0–100.0)

## 2019-10-02 LAB — HEPATIC FUNCTION PANEL
ALT: 26 U/L (ref 0–44)
AST: 23 U/L (ref 15–41)
Albumin: 3.8 g/dL (ref 3.5–5.0)
Alkaline Phosphatase: 73 U/L (ref 38–126)
Bilirubin, Direct: 0.2 mg/dL (ref 0.0–0.2)
Indirect Bilirubin: 0.2 mg/dL — ABNORMAL LOW (ref 0.3–0.9)
Total Bilirubin: 0.4 mg/dL (ref 0.3–1.2)
Total Protein: 6.3 g/dL — ABNORMAL LOW (ref 6.5–8.1)

## 2019-10-02 LAB — CBC
HCT: 45.3 % (ref 39.0–52.0)
Hemoglobin: 15.2 g/dL (ref 13.0–17.0)
MCH: 30.8 pg (ref 26.0–34.0)
MCHC: 33.6 g/dL (ref 30.0–36.0)
MCV: 91.9 fL (ref 80.0–100.0)
Platelets: 245 10*3/uL (ref 150–400)
RBC: 4.93 MIL/uL (ref 4.22–5.81)
RDW: 13 % (ref 11.5–15.5)
WBC: 7.5 10*3/uL (ref 4.0–10.5)
nRBC: 0 % (ref 0.0–0.2)

## 2019-10-02 LAB — TROPONIN I (HIGH SENSITIVITY): Troponin I (High Sensitivity): 4 ng/L (ref <18)

## 2019-10-02 MED ORDER — ONDANSETRON HCL 4 MG/2ML IJ SOLN
4.0000 mg | Freq: Once | INTRAMUSCULAR | Status: AC
  Administered 2019-10-02: 4 mg via INTRAVENOUS
  Filled 2019-10-02: qty 2

## 2019-10-02 MED ORDER — IOHEXOL 300 MG/ML  SOLN
100.0000 mL | Freq: Once | INTRAMUSCULAR | Status: AC | PRN
  Administered 2019-10-02: 100 mL via INTRAVENOUS

## 2019-10-02 NOTE — Telephone Encounter (Signed)
Contacted pt to go over lab results pt is aware and doesn't have any questions or concerns 

## 2019-10-02 NOTE — ED Provider Notes (Signed)
[facility] Emergency Department Provider Note MRN:  [id number]  Arrival date & time: 10/02/19     Chief Complaint   Abdominal pain History of Present Illness   Joshua [name] is a 59 y.o. year-old male with a history of brain tumor, seizures presenting to the ED with chief complaint of abdominal.  Location: Right lower quadrant Duration: 4 or 5 hours Onset: Sudden Timing: Constant Description: Sharp Severity: Moderate to severe Exacerbating/Alleviating Factors: Worse with certain motions or positions Associated Symptoms: Mild nausea, pain took his breath away Pertinent Negatives: Denies chest pain, no fever, no cough, no upper abdominal pain, no numbness or weakness   Review of Systems  A complete 10 system review of systems was obtained and all systems are negative except as noted in the HPI and PMH.   Patient's Health History    Past Medical History:  Diagnosis Date  . Brain tumor ([place]) 02/20/2013   brain tumor removed in March 2014, Dr Donald Pore  . Dizziness   . Enlarged prostate   . Headache(784.0)    scattered  . Meningioma ([place])   . Seizures ([place])     Past Surgical History:  Procedure Laterality Date  . APPLICATION OF CRANIAL NAVIGATION N/A 01/10/2019   Procedure: APPLICATION OF CRANIAL NAVIGATION;  Surgeon: Erline Levine, MD;  Location: [place];  Service: Neurosurgery;  Laterality: N/A;  . CATARACT EXTRACTION Right   . CRANIOTOMY Right 11/06/2012   Procedure: CRANIOTOMY TUMOR EXCISION;  Surgeon: Erline Levine, MD;  Location: [place] NEUR[facility]S;  Service: Neurosurgery;  Laterality: Right;  Right Parasagittal craniotomy for meningioma with Stealth  . CRANIOTOMY Right 01/10/2019   Procedure: Right Parasagittal Craniotomy for Tumor;  Surgeon: Erline Levine, MD;  Location: [place];  Service: Neurosurgery;  Laterality: Right;  Right parasagittal craniotomy for tumor  . ESOPHAGOGASTRODUODENOSCOPY    . right knee arthroscopy      Family History  Problem  Relation Age of Onset  . Hypertension Mother   . Hypertension Father     Social History   Socioeconomic History  . Marital status: Married    Spouse name: Not on file  . Number of children: Not on file  . Years of education: Not on file  . Highest education level: Not on file  Occupational History  . Not on file  Tobacco Use  . Smoking status: Former Smoker    Types: Cigarettes    Quit date: 01/29/2019    Years since quitting: 0.6  . Smokeless tobacco: Never Used  . Tobacco comment: hasn't smoked in a week  Substance and Sexual Activity  . Alcohol use: Yes    Alcohol/week: 8.0 standard drinks    Types: 6 Cans of beer, 2 Shots of liquor per week    Comment: occasionally  . Drug use: No  . Sexual activity: Yes  Other Topics Concern  . Not on file  Social History Narrative  . Not on file   Social Determinants of Health   Financial Resource Strain:   . Difficulty of Paying Living Expenses:   Food Insecurity:   . Worried About Charity fundraiser in the Last Year:   . Arboriculturist in the Last Year:   Transportation Needs: No Transportation Needs  . Lack of Transportation (Medical): No  . Lack of Transportation (Non-Medical): No  Physical Activity:   . Days of Exercise per Week:   . Minutes of Exercise per Session:   Stress:   . Feeling of  Stress :   Social Connections:   . Frequency of Communication with Friends and Family:   . Frequency of Social Gatherings with Friends and Family:   . Attends Religious Services:   . Active Member of Clubs or Organizations:   . Attends Archivist Meetings:   Marland Kitchen Marital Status:   Intimate Partner Violence:   . Fear of Current or Ex-Partner:   . Emotionally Abused:   Marland Kitchen Physically Abused:   . Sexually Abused:      Physical Exam   Vitals:   10/02/19 2145 10/02/19 2219  BP: (!) 121/91 124/85  Pulse: 73 82  Resp: 18 17  Temp:    SpO2: 96% 90%    CONSTITUTIONAL: Well-appearing, NAD NEURO:  Alert and oriented x  3, no focal deficits EYES:  eyes equal and reactive ENT/NECK:  no LAD, no JVD CARDIO: Regular rate, well-perfused, normal S1 and S2 PULM:  CTAB no wheezing or rhonchi GI/GU:  normal bowel sounds, non-distended, non-tender MSK/SPINE:  No gross deformities, no edema SKIN:  no rash, atraumatic PSYCH:  Appropriate speech and behavior  *Additional and/or pertinent findings included in MDM below  Diagnostic and Interventional Summary    EKG Interpretation  Date/Time:    Ventricular Rate:    PR Interval:    QRS Duration:   QT Interval:    QTC Calculation:   R Axis:     Text Interpretation:        Labs Reviewed  URINALYSIS, ROUTINE W REFLEX MICROSCOPIC - Abnormal; Notable for the following components:      Result Value   Protein, ur 30 (*)    Bacteria, UA RARE (*)    All other components within normal limits  HEPATIC FUNCTION PANEL - Abnormal; Notable for the following components:   Total Protein 6.3 (*)    Indirect Bilirubin 0.2 (*)    All other components within normal limits  CBC  BASIC METABOLIC PANEL  BRAIN NATRIURETIC PEPTIDE  TROPONIN I (HIGH SENSITIVITY)  TROPONIN I (HIGH SENSITIVITY)    CT ABDOMEN PELVIS W CONTRAST  Final Result    DG Chest 2 View  Final Result      Medications  ondansetron (ZOFRAN) injection 4 mg (4 mg Intravenous Given 10/02/19 2120)  iohexol (OMNIPAQUE) 300 MG/ML solution 100 mL (100 mLs Intravenous Contrast Given 10/02/19 2209)     Procedures  /  Critical Care Procedures  ED Course and Medical Decision Making  I have reviewed the triage vital signs, the nursing notes, and pertinent available records from the EMR.  Pertinent labs & imaging results that were available during my care of the patient were reviewed by me and considered in my medical decision making (see below for details).     Tender right lower quadrant, considering appendicitis versus right-sided diverticulitis versus kidney stone.  Vitals are reassuring, no peritonitis,  no rebound, no guarding, no rigidity.  CT scan reveals nonspecific mesenteric adenitis.  Upon further questioning, patient had cough and cold-like symptoms about 2 weeks ago, suspect this is reactionary.  Continued reassuring neurological exam, labs are reassuring, no leukocytosis, feeling much better, currently without pain or nausea.  Patient is appropriate for discharge on anti-inflammatories.    Barth Kirks. [place] Small, [place] [email or web address]  Final Clinical Impressions(s) / ED Diagnoses     ICD-10-CM   1. Nonspecific mesenteric adenitis  I88.0     ED Discharge Orders    None  Discharge Instructions Discussed with and Provided to Patient:     Discharge Instructions     You were evaluated in the Emergency Department and after careful evaluation, we did not find any emergent condition requiring admission or further testing in the hospital.  Your exam/testing today was overall reassuring.  We recommend Tylenol 1000 mg every 4-6 hours.  Continue taking your Mobic at home.  Your symptoms seem to be due to some inflammation of the lymph nodes inside your abdomen.  This should get better on its own within the next few days.  Please return to the Emergency Department if you experience any worsening of your condition.  We encourage you to follow up with a primary care provider.  Thank you for allowing [place] to be a part of your care.       Maudie Flakes, MD 10/02/19 [phone]

## 2019-10-02 NOTE — Telephone Encounter (Signed)
Attempted to reach pt, busy signal

## 2019-10-02 NOTE — Telephone Encounter (Signed)
Patient called and would like to know he can use a icy hot patch with the seizure he is on, please call him at [phone]

## 2019-10-02 NOTE — Telephone Encounter (Signed)
-----   Message from Charlott Rakes, MD sent at 10/02/2019  1:27 PM EDT ----- Please inform the patient that labs are normal. Thank you.

## 2019-10-02 NOTE — Discharge Instructions (Addendum)
You were evaluated in the Emergency Department and after careful evaluation, we did not find any emergent condition requiring admission or further testing in the hospital.  Your exam/testing today was overall reassuring.  We recommend Tylenol 1000 mg every 4-6 hours.  Continue taking your Mobic at home.  Your symptoms seem to be due to some inflammation of the lymph nodes inside your abdomen.  This should get better on its own within the next few days.  Please return to the Emergency Department if you experience any worsening of your condition.  We encourage you to follow up with a primary care provider.  Thank you for allowing [place] to be a part of your care.

## 2019-10-02 NOTE — ED Triage Notes (Signed)
Pt here for evaluation of shob onset today while he was at home sitting down. Pt also c/o right flank pain. Denies burning or pain with urination, sts UA yesterday at PCP showed a trace of blood.

## 2019-10-03 ENCOUNTER — Telehealth: Payer: Self-pay

## 2019-10-03 NOTE — Telephone Encounter (Signed)
Received telephone message that the patient has concerns about symptoms that he is experiencing and feels that he needs his medications addressed. Called patient back and left message to return call.

## 2019-10-03 NOTE — Telephone Encounter (Signed)
Contacted patient and he verbalized concerns regarding mood swings, not being able to sleep, and feelings of doom and he is relating this to his medications. Explained that in order to address these concerns he would need to be seen by Dr. Mickeal Skinner. Patient verbalized understanding and an appointment was made for the next business day. He had no other questions or concerns.

## 2019-10-03 NOTE — Telephone Encounter (Signed)
Patient states he his having mental changes- angry and "just all over the place". Patient has had recent change in medication. Called to cancer center- and had to leave message on nurse line. Advised patient message was left for a call back- also advised patient to call PCP for assistance with his recent changes. Patient advised to go to ED again if he feels he has to. Patient understands and is going to follow advisement.

## 2019-10-04 ENCOUNTER — Inpatient Hospital Stay: Payer: Medicaid Other | Attending: Internal Medicine | Admitting: Internal Medicine

## 2019-10-04 ENCOUNTER — Ambulatory Visit: Payer: Medicaid Other

## 2019-10-04 ENCOUNTER — Other Ambulatory Visit: Payer: Self-pay

## 2019-10-04 ENCOUNTER — Other Ambulatory Visit: Payer: Self-pay | Admitting: Internal Medicine

## 2019-10-04 VITALS — BP 135/91 | HR 79 | Temp 98.9°F | Resp 18 | Ht 69.0 in | Wt 206.7 lb

## 2019-10-04 DIAGNOSIS — I700 Atherosclerosis of aorta: Secondary | ICD-10-CM | POA: Diagnosis not present

## 2019-10-04 DIAGNOSIS — Z79899 Other long term (current) drug therapy: Secondary | ICD-10-CM | POA: Insufficient documentation

## 2019-10-04 DIAGNOSIS — Z87891 Personal history of nicotine dependence: Secondary | ICD-10-CM | POA: Diagnosis not present

## 2019-10-04 DIAGNOSIS — Z7982 Long term (current) use of aspirin: Secondary | ICD-10-CM | POA: Insufficient documentation

## 2019-10-04 DIAGNOSIS — G40119 Localization-related (focal) (partial) symptomatic epilepsy and epileptic syndromes with simple partial seizures, intractable, without status epilepticus: Secondary | ICD-10-CM | POA: Insufficient documentation

## 2019-10-04 DIAGNOSIS — D320 Benign neoplasm of cerebral meninges: Secondary | ICD-10-CM | POA: Diagnosis not present

## 2019-10-04 DIAGNOSIS — R569 Unspecified convulsions: Secondary | ICD-10-CM

## 2019-10-04 DIAGNOSIS — D329 Benign neoplasm of meninges, unspecified: Secondary | ICD-10-CM | POA: Diagnosis not present

## 2019-10-04 MED ORDER — LAMOTRIGINE 100 MG PO TABS: 200.0000 mg | ORAL_TABLET | Freq: Two times a day (BID) | ORAL | 2 refills | Status: DC

## 2019-10-04 MED ORDER — ONDANSETRON 4 MG PO TBDP: 4.0000 mg | ORAL_TABLET | Freq: Three times a day (TID) | ORAL | 0 refills | Status: DC | PRN

## 2019-10-04 NOTE — Telephone Encounter (Signed)
Patient called requesting to speak with the nurse regarding an infection that he states he has. Told patient that he needed to schedule an appt with his PCP but he states that he needs to speak with a nurse. Please f/u

## 2019-10-04 NOTE — Progress Notes (Signed)
[facility] [address], [place] [address] [phone]   Interval Evaluation  Date of Service: 10/04/19 Patient Name: Joshua [name] Patient MRN: [id number] Patient DOB: 1960-10-06 Provider: [name], MD  Identifying Statement:  Joshua [name] is a 59 y.o. 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 Vertell Limber (WHO I) 01/10/19: Repeat craniotomy, debulking resection by Dr. Vertell Limber after tumor recurrence, seizures.  Path demonstrates islands of anaplasia c/w grade II/III. 04/05/19: Completes post-operative IMRT with Dr. Lisbeth Renshaw  Interval History:  Joshua [name] presents today for follow up after clinical changes.  He describes worsening mood swings and aggression.  Recently was diagnosed with enteritis which has led to worsening nausea and some vomiting.  Seizure frequency has not increased, still roughly one event per week, including one this morning. He describes continued numbness of the left leg, although he has improved with recent physical therapy. Otherwise no new or progressive neurologic deficits.  H+P (02/05/19) Patient presents to review clinical course and care for his meningioma.  Initially he presented in 2014 with headache syndrome, was found to have tumor on imaging which was resected by Dr. Vertell Limber.  The patient was lost to follow up for ~5 years until he developed seizures, described as "twitching of left leg spreading to arm" this past month.  He required loads of Keppra and Dilantin to break events.  MRI demonstrated significant regrowth of the mass, and repeat craniotomy was performed on 01/10/19.  Small amount of residual tumor was visible on post-operative MRI.  Since surgery he has continued to experience numbness and clumsiness of his left leg, requiring cane for ambulation.  He is still pending home physical therapy  evaluation.  He has had "small" seizures, consisting of few seconds of shaking of left leg, occurring less than daily.  Continues on Keppra and Dilantin.    Medications: Current Outpatient Medications on File Prior to Visit  Medication Sig Dispense Refill  . aspirin EC 81 MG EC tablet Take 1 tablet (81 mg total) by mouth daily. 30 tablet 3  . clonazePAM (KLONOPIN) 0.5 MG tablet Take 1 tablet (0.5 mg total) by mouth 2 (two) times daily. 60 tablet 1  . cyclobenzaprine (FLEXERIL) 10 MG tablet Take 1 tablet (10 mg total) by mouth 2 (two) times daily as needed for muscle spasms. 60 tablet 3  . FIBER ADULT GUMMIES PO Take 1 tablet by mouth daily.    . furosemide (LASIX) 20 MG tablet Take 1 tablet (20 mg total) by mouth daily. Prn swelling.  Use sparingly 30 tablet 1  . lamoTRIgine (LAMICTAL) 100 MG tablet TAKE 1 TABLET(100 MG) BY MOUTH TWICE DAILY (Patient taking differently: Take 100 mg by mouth 2 (two) times daily. ) 60 tablet 2  . levETIRAcetam (KEPPRA) 500 MG tablet TAKE 2 TABLETS(1000 MG) BY MOUTH TWICE DAILY (Patient taking differently: Take 1,000 mg by mouth 2 (two) times daily. ) 180 tablet 1  . LORazepam (ATIVAN) 1 MG tablet Take 1 tablet (1 mg total) by mouth every 8 (eight) hours as needed for seizure (for breakthrough seizures). 12 tablet 0  . meloxicam (MOBIC) 7.5 MG tablet Take 1 tablet (7.5 mg total) by mouth daily. 30 tablet 1  . olopatadine (PATANOL) 0.1 % ophthalmic solution Place 1 drop into both eyes 2 (two) times daily. (Patient not taking: Reported on 10/02/2019) 5 mL 0  . ondansetron (ZOFRAN-ODT)  4 MG disintegrating tablet Take 1 tablet (4 mg total) by mouth every 8 (eight) hours as needed for nausea or vomiting. (Patient not taking: Reported on 10/02/2019) 12 tablet 0  . phenytoin (DILANTIN) 200 MG ER capsule Take 1 capsule (200 mg total) by mouth 2 (two) times daily. 60 capsule 0  . prochlorperazine (COMPAZINE) 10 MG tablet Take 1 tablet (10 mg total) by mouth every 6 (six) hours as  needed for nausea or vomiting. (Patient not taking: Reported on 09/19/2019) 30 tablet 1  . sucralfate (CARAFATE) 1 g tablet Take 1 tablet (1 g total) by mouth 4 (four) times daily -  with meals and at bedtime for 14 days. 56 tablet 0  . triamcinolone cream (KENALOG) 0.1 % Apply 1 application topically 2 (two) times daily. (Patient not taking: Reported on 09/19/2019) 30 g 1  . vitamin C (ASCORBIC ACID) 500 MG tablet Take 500 mg by mouth daily.    . [DISCONTINUED] cetirizine (ZYRTEC) 10 MG tablet Take 1 tablet (10 mg total) by mouth daily. 30 tablet 11   No current facility-administered medications on file prior to visit.    Allergies: No Known Allergies Past Medical History:  Past Medical History:  Diagnosis Date  . Brain tumor ([place]) 02/20/2013   brain tumor removed in March 2014, Dr Donald Pore  . Dizziness   . Enlarged prostate   . Headache(784.0)    scattered  . Meningioma ([facility])   . Seizures ([facility])    Past Surgical History:  Past Surgical History:  Procedure Laterality Date  . APPLICATION OF CRANIAL NAVIGATION N/A 01/10/2019   Procedure: APPLICATION OF CRANIAL NAVIGATION;  Surgeon: Erline Levine, MD;  Location: [facility];  Service: Neurosurgery;  Laterality: N/A;  . CATARACT EXTRACTION Right   . CRANIOTOMY Right 11/06/2012   Procedure: CRANIOTOMY TUMOR EXCISION;  Surgeon: Erline Levine, MD;  Location: [facility];  Service: Neurosurgery;  Laterality: Right;  Right Parasagittal craniotomy for meningioma with Stealth  . CRANIOTOMY Right 01/10/2019   Procedure: Right Parasagittal Craniotomy for Tumor;  Surgeon: Erline Levine, MD;  Location: [facility];  Service: Neurosurgery;  Laterality: Right;  Right parasagittal craniotomy for tumor  . ESOPHAGOGASTRODUODENOSCOPY    . right knee arthroscopy     Social History:  Social History   Socioeconomic History  . Marital status: Married    Spouse name: Not on file  . Number of children: Not on file  . Years of education: Not on file  . Highest education  level: Not on file  Occupational History  . Not on file  Tobacco Use  . Smoking status: Former Smoker    Types: Cigarettes    Quit date: 01/29/2019    Years since quitting: 0.6  . Smokeless tobacco: Never Used  . Tobacco comment: hasn't smoked in a week  Substance and Sexual Activity  . Alcohol use: Yes    Alcohol/week: 8.0 standard drinks    Types: 6 Cans of beer, 2 Shots of liquor per week    Comment: occasionally  . Drug use: No  . Sexual activity: Yes  Other Topics Concern  . Not on file  Social History Narrative  . Not on file   Social Determinants of Health   Financial Resource Strain:   . Difficulty of Paying Living Expenses:   Food Insecurity:   . Worried About Charity fundraiser in the Last Year:   . Arboriculturist in the Last Year:   Transportation Needs: No Transportation Needs  .  Lack of Transportation (Medical): No  . Lack of Transportation (Non-Medical): No  Physical Activity:   . Days of Exercise per Week:   . Minutes of Exercise per Session:   Stress:   . Feeling of Stress :   Social Connections:   . Frequency of Communication with Friends and Family:   . Frequency of Social Gatherings with Friends and Family:   . Attends Religious Services:   . Active Member of Clubs or Organizations:   . Attends Archivist Meetings:   Marland Kitchen Marital Status:   Intimate Partner Violence:   . Fear of Current or Ex-Partner:   . Emotionally Abused:   Marland Kitchen Physically Abused:   . Sexually Abused:    Family History:  Family History  Problem Relation Age of Onset  . Hypertension Mother   . Hypertension Father     Review of Systems: Constitutional: Denies fevers, chills or abnormal weight loss Eyes: Denies blurriness of vision Ears, nose, mouth, throat, and face: Denies mucositis or sore throat Respiratory: Denies cough, dyspnea or wheezes Cardiovascular: Denies palpitation, chest discomfort or lower extremity swelling Gastrointestinal:  Denies nausea,  constipation, diarrhea GU: Denies dysuria or incontinence Skin: Denies abnormal skin rashes Neurological: Per HPI Musculoskeletal: Denies joint pain, back or neck discomfort. No decrease in ROM Behavioral/Psych: Denies anxiety, disturbance in thought content, and mood instability  Physical Exam: Vitals:   10/04/19 1220  BP: (!) 135/91  Pulse: 79  Resp: 18  Temp: 98.9 F (37.2 C)  SpO2: 96%   KPS: 80. General: Alert, cooperative, pleasant, in no acute distress Head: Craniotomy scar noted, dry and intact. EENT: No conjunctival injection or scleral icterus. Oral mucosa moist Lungs: Resp effort normal Cardiac: Regular rate and rhythm Abdomen: Soft, non-distended abdomen Skin: No rashes cyanosis or petechiae. Extremities: No clubbing or edema  Neurologic Exam: Mental Status: Awake, alert, attentive to examiner. Oriented to self and environment. Language is fluent with intact comprehension.  Cranial Nerves: Visual acuity is grossly normal. Visual fields are full. Extra-ocular movements intact. No ptosis. Face is symmetric, tongue midline. Motor: Tone and bulk are normal.Power is impaired in left leg, 4+/5 distally. Reflexes are symmetric, no pathologic reflexes present. Impaired heel to shin left leg Sensory: Diminished to light touch and temp left lower leg Gait: Independent  Labs: I have reviewed the data as listed    Component Value Date/Time   NA 142 10/02/2019 1855   K 4.0 10/02/2019 1855   CL 104 10/02/2019 1855   CO2 26 10/02/2019 1855   GLUCOSE 89 10/02/2019 1855   BUN 11 10/02/2019 1855   CREATININE 0.98 10/02/2019 1855   CALCIUM 9.2 10/02/2019 1855   PROT 6.3 (L) 10/02/2019 2139   ALBUMIN 3.8 10/02/2019 2139   AST 23 10/02/2019 2139   ALT 26 10/02/2019 2139   ALKPHOS 73 10/02/2019 2139   BILITOT 0.4 10/02/2019 2139   GFRNONAA >60 10/02/2019 1855   GFRAA >60 10/02/2019 1855   Lab Results  Component Value Date   WBC 7.5 10/02/2019   NEUTROABS 2.3 09/19/2019    HGB 15.2 10/02/2019   HCT 45.3 10/02/2019   MCV 91.9 10/02/2019   PLT 245 10/02/2019   Imaging:  [facility] Clinician Interpretation: I have personally reviewed the CNS images as listed.  My interpretation, in the context of the patient's clinical presentation, is stable disease  DG Chest 2 View  Result Date: 10/02/2019 CLINICAL DATA:  Dyspnea EXAM: CHEST - 2 VIEW COMPARISON:  May 20, 2019 FINDINGS: Normal  cardiomediastinal silhouette. Shallow inspiration. No pneumonia, interstitial edema, pleural effusion or pneumothorax. Moderate skeletal degenerative change. Aortic knob calcified atherosclerosis. No free subdiaphragmatic air. IMPRESSION: Shallow inspiration without pulmonary edema or pneumonia. Aortic calcified atherosclerosis. Electronically Signed   By: Revonda Humphrey   On: 10/02/2019 19:22   CT Angio Neck W and/or Wo Contrast  Result Date: 09/19/2019 CLINICAL DATA:  Carotid artery stenosis. Left-sided paresthesia and neck pain. Left facial arm numbness. EXAM: CT ANGIOGRAPHY NECK TECHNIQUE: Multidetector CT imaging of the neck was performed using the standard protocol during bolus administration of intravenous contrast. Multiplanar CT image reconstructions and MIPs were obtained to evaluate the vascular anatomy. Carotid stenosis measurements (when applicable) are obtained utilizing NASCET criteria, using the distal internal carotid diameter as the denominator. CONTRAST:  135mL OMNIPAQUE IOHEXOL 350 MG/ML SOLN COMPARISON:  None. FINDINGS: Aortic arch: Aortic atherosclerosis.  No aneurysm or dissection. Right carotid system: Common carotid artery widely patent to the bifurcation region. Carotid bifurcation is free of atherosclerotic disease. No stenosis or irregularity. Cervical ICA widely patent. Left carotid system: Common carotid artery widely patent to the bifurcation. Carotid bifurcation is free of atherosclerotic disease. No stenosis or irregularity. Cervical ICA is widely patent. Vertebral  arteries: Vertebral artery origins are widely patent. No proximal subclavian stenosis. Both vertebral arteries are widely patent through the cervical region to the foramen magnum. Skeleton: Ordinary degenerative spondylosis. Other neck: No mass or lymphadenopathy. Upper chest: Normal The examination does extend to the circle-of-Willis region. No proximal intracranial vascular abnormality is demonstrated. IMPRESSION: Normal examination. No stenotic disease. No demonstration of soft or calcified plaque. Electronically Signed   By: Nelson Chimes M.D.   On: 09/19/2019 18:23   CT ABDOMEN PELVIS W CONTRAST  Result Date: 10/02/2019 CLINICAL DATA:  Right-sided abdominal pain and nausea for 1 month EXAM: CT ABDOMEN AND PELVIS WITH CONTRAST TECHNIQUE: Multidetector CT imaging of the abdomen and pelvis was performed using the standard protocol following bolus administration of intravenous contrast. CONTRAST:  164mL OMNIPAQUE IOHEXOL 300 MG/ML  SOLN COMPARISON:  Abdominal ultrasound 06/20/2006 FINDINGS: Lower chest: Lung bases are clear. Normal heart size. No pericardial effusion. Hepatobiliary: No focal liver abnormality is seen. No gallstones, gallbladder wall thickening, or biliary dilatation. Pancreas: Unremarkable. No pancreatic ductal dilatation or surrounding inflammatory changes. Spleen: Normal in size without focal abnormality. Adrenals/Urinary Tract: Adrenal glands are unremarkable. Kidneys are normal, without renal calculi, focal lesion, or hydronephrosis. Much of the bladder is obscured by extensive streak artifact from patient's right hip arthroplasty. Question some mild circumferential bladder wall thickening with faint perivesicular hazy stranding. Stomach/Bowel: Distal esophagus, stomach and duodenal sweep are unremarkable. No small bowel wall thickening or dilatation. No evidence of obstruction. A normal appendix is visualized. No colonic dilatation or wall thickening. Vascular/Lymphatic: Atherosclerotic  plaque within the normal caliber aorta. Focal region of faint mid mesenteric hazy stranding with numerous reactive appearing clustered mid mesenteric lymph nodes compatible with a mild mesenteritis (3/33). Reproductive: The prostate and seminal vesicles are unremarkable. Other: No abdominopelvic free fluid or free gas. No bowel containing hernias. Small fat containing umbilical hernia with some stranding in the herniated fat and slight deviation of an adjacent small bowel loops towards the defect without gross protrusion into the hernia sac at this time. Musculoskeletal: Prior right hip total arthroplasty. No evidence of hardware fracture or failure. No periprosthetic complications. Postsurgical changes noted in the soft tissues of the right hip. Multilevel degenerative changes are present in the imaged portions of the spine. Large bridging osteophyte seen  laterally in the upper lumbar spine more pronounced discogenic changes in the lower lumbar levels with slight retrolisthesis L5-S1 and moderate to severe neural foraminal narrowing at this level. No associated spondylolysis. IMPRESSION: 1. Faint mid mesenteric hazy stranding with numerous reactive appearing mid mesenteric lymph nodes compatible with a mild mesenteritis. 2. Possible mild circumferential bladder wall thickening with faint perivesicular hazy stranding. Correlate with urinalysis to exclude the possibility of cystitis. 3. Small fat containing umbilical hernia with some stranding in the herniated fat and slight deviation of an adjacent small bowel loops towards the defect without gross protrusion into the hernia sac at this time (3/49). Fascial defect of 1 x 1 cm. Correlate for point tenderness. 4. Prior right hip total arthroplasty without evidence of hardware fracture or failure. 5. Multilevel degenerative changes in the imaged spine more pronounced in the lower lumbar levels with slight retrolisthesis L5-S1 and moderate to severe neural foraminal  narrowing at this level. 6. Aortic Atherosclerosis (ICD10-I70.0). Electronically Signed   By: Lovena Le M.D.   On: 10/02/2019 22:31    Assessment/Plan 1. Meningioma, recurrent of brain ([place])  2. Seizures [facility])  Mr. Presto describes clinical changes today which are likely iatrogenic.  Mood lability and aggression are commonly seen with Keppra; this is also not a new finding for him.  For refractory focal epilepsy we recommended the following changes: -Discontinue Keppra (currently 500mg  BID) -Increase Lamictal to 200mg  BID  Regarding additional AEDs: -Con't Clonazepam 0.5mg  BID -Con't PHT 200mg  BID  Further changes could include increasing standing Clonazepam to 1mg  BID, but will hold off on that for now.   We appreciate the opportunity to participate in the care of [facility].  We will revisit with him in 1 month as planned, or sooner if needed for clinical or epileptic events.  All questions were answered. The patient knows to call the clinic with any problems, questions or concerns. No barriers to learning were detected.    The total time spent in the encounter was 30 minutes and more than 50% was on counseling and review of test results   [name], MD Medical Director of Neuro-Oncology [facility] 10/04/19 12:17 PM

## 2019-10-04 NOTE — Telephone Encounter (Signed)
Called patient stated he is at the oncologist appt / he will call the office later/ Name and contact info provided

## 2019-10-08 ENCOUNTER — Telehealth: Payer: Self-pay | Admitting: *Deleted

## 2019-10-08 NOTE — Telephone Encounter (Signed)
Patient called back about the Compazine and states he does not need it because he got the zofran.

## 2019-10-08 NOTE — Telephone Encounter (Signed)
Patient requested to [facility] refill for Compazine.  We just last week refilled Zofran.  Called and left message on patients vm to inquire about nausea and see if two medications are warranted for nausea.  Pending call back.

## 2019-10-10 ENCOUNTER — Telehealth: Payer: Self-pay | Admitting: Family Medicine

## 2019-10-10 NOTE — Telephone Encounter (Signed)
He needs a virtual visit to evaluate his symptoms.

## 2019-10-10 NOTE — Telephone Encounter (Signed)
Patient is having trouble sleeping

## 2019-10-10 NOTE — Telephone Encounter (Signed)
Patient called and requested to inform pcp that he is having sleep paralysis. Patient stated that he gets up every morning at 3am, and this has been going on for about a month now. Patient stated that he gets up at 3am , takes his medications around 7-8ish and knocks back out until 10:30-11am. Please follow up with the patients concerns at your earliest convenience.

## 2019-10-11 ENCOUNTER — Telehealth: Payer: Self-pay | Admitting: *Deleted

## 2019-10-11 ENCOUNTER — Other Ambulatory Visit: Payer: Self-pay | Admitting: Internal Medicine

## 2019-10-11 ENCOUNTER — Ambulatory Visit: Payer: Medicaid Other

## 2019-10-11 MED ORDER — MIRTAZAPINE 15 MG PO TABS: 15.0000 mg | ORAL_TABLET | Freq: Every day | ORAL | 3 refills | Status: DC

## 2019-10-11 NOTE — Telephone Encounter (Signed)
Per Dr. Mickeal Skinner will order Remeron for sleep aide assistance.  Patient will report back if it is helpful.

## 2019-10-11 NOTE — Telephone Encounter (Signed)
Patient called to ask about a few things.   He states that he wants a 3-4 day break from rehab due to lower extremity swelling.  States he is using Compression Stockings but feels he needs a couple of days rest to help faciliate decrease in swelling.  If Dr. Mickeal Skinner is agreeable then we will need to let neuro rehab know to delay treatment.   Also reports that for the past month he has been waking up at 3 am with insomnia.  He is worried this will provoke a seizure and requests sleep aide recommendations.  Routed to MD to advise.

## 2019-10-14 ENCOUNTER — Telehealth: Payer: Self-pay | Admitting: *Deleted

## 2019-10-14 ENCOUNTER — Other Ambulatory Visit: Payer: Self-pay | Admitting: Internal Medicine

## 2019-10-14 MED ORDER — LORAZEPAM 1 MG PO TABS: 1.0000 mg | ORAL_TABLET | Freq: Three times a day (TID) | ORAL | 0 refills | Status: DC | PRN

## 2019-10-14 MED ORDER — CLONAZEPAM 0.5 MG PO TABS: 0.5000 mg | ORAL_TABLET | Freq: Two times a day (BID) | ORAL | 1 refills | Status: DC

## 2019-10-14 NOTE — Telephone Encounter (Signed)
Patient called requesting refill of Klonopin and Ativan.  Patient states he will not have enough of Klonopin to complete tomorrow's dose.  Refill request to [facility].   Routed to MD.

## 2019-10-14 NOTE — Telephone Encounter (Signed)
Patient was called and informed of telephone visit appointment

## 2019-10-15 ENCOUNTER — Encounter (HOSPITAL_COMMUNITY): Payer: Self-pay

## 2019-10-15 ENCOUNTER — Telehealth: Payer: Self-pay

## 2019-10-15 ENCOUNTER — Ambulatory Visit (INDEPENDENT_AMBULATORY_CARE_PROVIDER_SITE_OTHER): Payer: Medicaid Other

## 2019-10-15 ENCOUNTER — Ambulatory Visit (HOSPITAL_COMMUNITY)
Admission: EM | Admit: 2019-10-15 | Discharge: 2019-10-15 | Disposition: A | Discharge status: Home / Self Care | Payer: Medicaid Other | Attending: Internal Medicine | Admitting: Internal Medicine

## 2019-10-15 ENCOUNTER — Ambulatory Visit: Payer: Medicaid Other | Attending: Family

## 2019-10-15 DIAGNOSIS — Z471 Aftercare following joint replacement surgery: Secondary | ICD-10-CM | POA: Diagnosis not present

## 2019-10-15 DIAGNOSIS — Z23 Encounter for immunization: Secondary | ICD-10-CM

## 2019-10-15 DIAGNOSIS — M7050 Other bursitis of knee, unspecified knee: Secondary | ICD-10-CM

## 2019-10-15 DIAGNOSIS — M25551 Pain in right hip: Secondary | ICD-10-CM

## 2019-10-15 DIAGNOSIS — Z96641 Presence of right artificial hip joint: Secondary | ICD-10-CM | POA: Diagnosis not present

## 2019-10-15 DIAGNOSIS — N481 Balanitis: Secondary | ICD-10-CM

## 2019-10-15 MED ORDER — CLOTRIMAZOLE-BETAMETHASONE 1-0.05 % EX CREA: TOPICAL_CREAM | CUTANEOUS | 0 refills | Status: AC

## 2019-10-15 MED ORDER — MICONAZOLE NITRATE 2 % EX AERP: INHALATION_SPRAY | CUTANEOUS | 0 refills | Status: DC

## 2019-10-15 MED ORDER — CYCLOBENZAPRINE HCL 10 MG PO TABS: 10.0000 mg | ORAL_TABLET | Freq: Two times a day (BID) | ORAL | 3 refills | Status: DC | PRN

## 2019-10-15 MED ORDER — IBUPROFEN 600 MG PO TABS: 600.0000 mg | ORAL_TABLET | Freq: Four times a day (QID) | ORAL | 0 refills | Status: DC | PRN

## 2019-10-15 NOTE — ED Triage Notes (Signed)
Pt c/o recurrent right hip pain; pain increasing recently and had to cease physical therapy 2/2 pain. Also c/o rash to penis and pain with urination for approx 3 weeks. Denies fever, chills, n/v/d, or abdom pain.

## 2019-10-15 NOTE — Telephone Encounter (Signed)
PT spoke with patient again this afternoon. He had hip checked and wants to continue to work on his leg and walking. PT scheduled for 4/16 and let patient know we will have to request Medicaid auth extension. Cherly Anderson, PT, DPT, NCS

## 2019-10-15 NOTE — Progress Notes (Signed)
   Covid-19 Vaccination Clinic  Name:  Joshua [name]    MRN: [id number] DOB: [birth year]  10/15/2019  [name] was observed post Covid-19 immunization for 15 minutes without incident. He was provided with Vaccine Information Sheet and instruction to access the V-Safe system.   [name] was instructed to call 911 with any severe reactions post vaccine: Marland Kitchen Difficulty breathing  . Swelling of face and throat  . A fast heartbeat  . A bad rash all over body  . Dizziness and weakness   Immunizations Administered    Name Date Dose VIS Date Route   Moderna COVID-19 Vaccine 10/15/2019  9:27 AM 0.5 mL 06/18/2019 Intramuscular   Manufacturer: Moderna   [id number]   [id number]

## 2019-10-15 NOTE — ED Provider Notes (Signed)
[place]    CSN: [id number] Arrival date & time: 10/15/19  [id number]      History   Chief Complaint Chief Complaint  Patient presents with  . Hip Pain  . Rash    HPI Joshua [name] is a 59 y.o. male with a history of right hip replacement comes to urgent care with complaints of right hip pain of 2 to 3 weeks duration.  Patient had brain tumor status post resection and has been undergoing physical therapy to help with his balance.  About 2 to 3 weeks ago he had a slip and fall event and following that he started experiencing worsening right hip pain.  Pain is worse with movement.  No known relieving factors.  He is able to ambulate with an antalgic gait.  He denies any numbness or tingling.  No weakness in the lower extremities.  No deformity of his hip.  Patient also complains of nonpruritic rash on his penis.  Is associated with some post voiding dysuria.  Is been ongoing for the past 3 weeks.  He denies any fever, chills abdominal pain, nausea, vomiting or flank pain.  No bloody urine.  Patient is uncircumcised.  HPI  Past Medical History:  Diagnosis Date  . Brain tumor ([place]) 02/20/2013   brain tumor removed in March 2014, Dr Donald Pore  . Dizziness   . Enlarged prostate   . Headache(784.0)    scattered  . Meningioma ([place])   . Seizures [facility])     Patient Active Problem List   Diagnosis Date Noted  . 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[place]) 03/07/2019  . Seizures ([place]) 01/31/2019  . Meningioma, recurrent of brain ([place]) 01/31/2019  . Meningioma ([place]) 01/03/2019  . Brain tumor ([place]) 12/27/2018  . Diffuse idiopathic skeletal hyperostosis 06/05/2013    Past Surgical History:  Procedure Laterality Date  . APPLICATION OF CRANIAL NAVIGATION N/A 01/10/2019   Procedure: APPLICATION OF CRANIAL NAVIGATION;  Surgeon: Erline Levine, MD;  Location: [facility];  Service: Neurosurgery;  Laterality: N/A;  . CATARACT EXTRACTION Right   . CRANIOTOMY Right 11/06/2012   Procedure: CRANIOTOMY TUMOR EXCISION;  Surgeon: Erline Levine, MD;  Location: [place] [facility];  Service: Neurosurgery;  Laterality: Right;  Right Parasagittal craniotomy for meningioma with [place]  . CRANIOTOMY Right 01/10/2019   Procedure: Right Parasagittal Craniotomy for Tumor;  Surgeon: Erline Levine, MD;  Location: [place];  Service: Neurosurgery;  Laterality: Right;  Right parasagittal craniotomy for tumor  . ESOPHAGOGASTRODUODENOSCOPY    . right knee arthroscopy         Home Medications    Prior to Admission medications   Medication Sig Start Date End Date Taking? Authorizing Provider  aspirin EC 81 MG EC tablet Take 1 tablet (81 mg total) by mouth daily. 01/14/19   Erline Levine, MD  clonazePAM (KLONOPIN) 0.5 MG tablet Take 1 tablet (0.5 mg total) by mouth 2 (two) times daily. 10/14/19   [name], MD  clotrimazole-betamethasone (LOTRISONE) cream Apply to affected area 2 times daily 10/15/19 10/21/19  Kindel Rochefort, Myrene Galas, MD  cyclobenzaprine (FLEXERIL) 10 MG tablet Take 1 tablet (10 mg total) by mouth 2 (two) times daily as needed for muscle spasms. 10/15/19   LampteyMyrene Galas, MD  FIBER ADULT GUMMIES PO Take 1 tablet by mouth daily.    [provider]  furosemide (LASIX) 20 MG tablet Take 1 tablet (20 mg total) by mouth daily. Prn swelling.  Use sparingly 08/22/19   Argentina Donovan,  PA-C  ibuprofen (ADVIL) 600 MG tablet Take 1 tablet (600 mg total) by mouth every 6 (six) hours as needed. 10/15/19   Chase Picket, MD  lamoTRIgine (LAMICTAL) 100 MG tablet Take 2 tablets (200 mg total) by mouth 2 (two) times daily. 10/04/19   Vaslow, Acey Lav, MD  LORazepam (ATIVAN) 1 MG tablet Take 1 tablet (1 mg total) by mouth every 8 (eight) hours as needed for seizure (for breakthrough seizures). 10/14/19   [name], MD  meloxicam (MOBIC) 7.5 MG tablet Take 1 tablet (7.5 mg total) by mouth daily. 09/25/19   Charlott Rakes, MD  Miconazole Nitrate 2 % AERP Apply to affected area BID as  needed 10/15/19   Kennedy Brines, Myrene Galas, MD  mirtazapine (REMERON) 15 MG tablet Take 1 tablet (15 mg total) by mouth at bedtime. 10/11/19   Vaslow, Acey Lav, MD  ondansetron (ZOFRAN-ODT) 4 MG disintegrating tablet Take 1 tablet (4 mg total) by mouth every 8 (eight) hours as needed for nausea or vomiting. 10/04/19   [name], MD  phenytoin (DILANTIN) 200 MG ER capsule Take 1 capsule (200 mg total) by mouth 2 (two) times daily. 09/19/19 10/19/19  Couture, Cortni S, PA-C  sucralfate (CARAFATE) 1 g tablet Take 1 tablet (1 g total) by mouth 4 (four) times daily -  with meals and at bedtime for 14 days. 07/23/19 10/02/19  Charlott Rakes, MD  vitamin C (ASCORBIC ACID) 500 MG tablet Take 500 mg by mouth daily.    [provider]  cetirizine (ZYRTEC) 10 MG tablet Take 1 tablet (10 mg total) by mouth daily. 08/08/19 09/08/19  Argentina Donovan, PA-C  prochlorperazine (COMPAZINE) 10 MG tablet Take 1 tablet (10 mg total) by mouth every 6 (six) hours as needed for nausea or vomiting. Patient not taking: Reported on 09/19/2019 03/08/19 10/15/19  Kyung Rudd, MD    Family History Family History  Problem Relation Age of Onset  . Hypertension Mother   . Hypertension Father     Social History Social History   Tobacco Use  . Smoking status: Former Smoker    Types: Cigarettes    Quit date: 01/29/2019    Years since quitting: 0.7  . Smokeless tobacco: Never Used  . Tobacco comment: hasn't smoked in a week  Substance Use Topics  . Alcohol use: Yes    Alcohol/week: 8.0 standard drinks    Types: 6 Cans of beer, 2 Shots of liquor per week    Comment: occasionally  . Drug use: No     Allergies   Patient has no known allergies.   Review of Systems Review of Systems  Constitutional: Positive for activity change. Negative for chills, fatigue and fever.  Respiratory: Negative for cough and shortness of breath.   Gastrointestinal: Negative for abdominal pain, nausea and vomiting.  Genitourinary:  Positive for dysuria. Negative for urgency.  Musculoskeletal: Positive for arthralgias and gait problem. Negative for back pain and joint swelling.  Skin: Negative.   Neurological: Negative for dizziness, light-headedness and headaches.     Physical Exam Triage Vital Signs ED Triage Vitals  Enc Vitals Group     BP 10/15/19 1029 (!) 144/89     Pulse Rate 10/15/19 1029 79     Resp 10/15/19 1029 18     Temp 10/15/19 1029 98.4 F (36.9 C)     Temp Source 10/15/19 1029 Oral     SpO2 10/15/19 1029 98 %     Weight --  Height --      Head Circumference --      Peak Flow --      Pain Score 10/15/19 1027 9     Pain Loc --      Pain Edu? --      Excl. in [place]? --    No data found.  Updated Vital Signs BP (!) 144/89 (BP Location: Right Arm)   Pulse 79   Temp 98.4 F (36.9 C) (Oral)   Resp 18   SpO2 98%   Visual Acuity Right Eye Distance:   Left Eye Distance:   Bilateral Distance:    Right Eye Near:   Left Eye Near:    Bilateral Near:     Physical Exam Vitals and nursing note reviewed.  Constitutional:      General: He is not in acute distress.    Appearance: He is not ill-appearing.  Cardiovascular:     Rate and Rhythm: Normal rate and regular rhythm.  Pulmonary:     Effort: Pulmonary effort is normal.     Breath sounds: Normal breath sounds.  Abdominal:     General: Bowel sounds are normal. There is no distension.     Palpations: Abdomen is soft. There is no mass.  Genitourinary:    Comments: Uncircumcised penis with some excoriation around the head of the penis.  Foreskin is fully retractable. Musculoskeletal:        General: Tenderness present. No swelling, deformity or signs of injury. Normal range of motion.     Right lower leg: No edema.     Left lower leg: No edema.  Skin:    Capillary Refill: Capillary refill takes less than 2 seconds.  Neurological:     General: No focal deficit present.     Mental Status: He is alert and oriented to person, place,  and time.      UC Treatments / Results  Labs (all labs ordered are listed, but only abnormal results are displayed) Labs Reviewed - No data to display  EKG   Radiology DG Hip Unilat With Pelvis 2-3 Views Right  Result Date: 10/15/2019 CLINICAL DATA:  Right hip pain.  Fall in February. EXAM: DG HIP (WITH OR WITHOUT PELVIS) 2-3V RIGHT COMPARISON:  03/27/2014. FINDINGS: Total right hip replacement. Hardware intact. Anatomic alignment. Degenerative changes lumbar spine left hip. Pelvic calcifications consistent phleboliths. IMPRESSION: Total right hip replacement. Hardware intact. Anatomic alignment. Degenerative changes lumbar spine and left hip. No acute abnormality identified. Electronically Signed   By: Marcello Moores  Register   On: 10/15/2019 11:38    Procedures Procedures (including critical care time)  Medications Ordered in UC Medications - No data to display  Initial Impression / Assessment and Plan / UC Course  I have reviewed the triage vital signs and the nursing notes.  Pertinent labs & imaging results that were available during my care of the patient were reviewed by me and considered in my medical decision making (see chart for details).     1. Right hip pain: Ibuprofen 600 mg every 6 hours as needed for pain Flexeril 10 mg twice daily as needed for muscle spasm X-ray of the right hip is negative for any fractures.  Prosthetic joint is well aligned and is in place.  2.  Balanitis circinata: Lotrisone cream Genital hygiene emphasized Miconazole powder as needed to keep the penile area dry. Return precautions given. Final Clinical Impressions(s) / UC Diagnoses   Final diagnoses:  Pain in right hip  Balanitis circinata  Discharge Instructions   None    ED Prescriptions    Medication Sig Dispense Auth. Provider   clotrimazole-betamethasone (LOTRISONE) cream Apply to affected area 2 times daily 15 g Abrahan Fulmore, Myrene Galas, MD   ibuprofen (ADVIL) 600 MG tablet Take 1  tablet (600 mg total) by mouth every 6 (six) hours as needed. 30 tablet Tyson Parkison, Myrene Galas, MD   cyclobenzaprine (FLEXERIL) 10 MG tablet Take 1 tablet (10 mg total) by mouth 2 (two) times daily as needed for muscle spasms. 60 tablet Fillmore Bynum, Myrene Galas, MD   Miconazole Nitrate 2 % AERP Apply to affected area BID as needed 85 g Eveleigh Crumpler, Myrene Galas, MD     PDMP not reviewed this encounter.   Chase Picket, MD 10/15/19 [phone]

## 2019-10-15 NOTE — Telephone Encounter (Signed)
PT called patient to figure out plan for care with new referral to address back pain from Dr. Margarita Rana. Left message and patient returned call right after. Pt reports that he does want to focus more on back/right hip now but also continue leg strengthening as has not been able to do much due to pain and some swelling in legs. He would feel better if had x-ray of hip that had been replaced prior to doing anything. PT explained that we did receive new order about back from Dr. Margarita Rana and as we had discussed prior it may be better to transfer to orthopedic PT clinic to address that need if will be biggest focus. Pt ok with that but will call back as wants to get x-ray on hip first before proceeding. PT notified front office about this and cancelled visit that was on PTA schedule next week. Cherly Anderson, PT, DPT, NCS

## 2019-10-16 ENCOUNTER — Telehealth: Payer: Self-pay | Admitting: *Deleted

## 2019-10-16 NOTE — Telephone Encounter (Signed)
Patient called needing more nausea medication.  We reordered Zofran on March 19th and he states he only has two tablets left.  He reports a lot of nausea which upon further investigation sounds more like GERD.  Advised that I would submit for refill but he needs to reach out to PCP for something for GERD and in the meantime he can try OTC medications for the GERD.    Routed to MD to refill Zofran or Compazine upon his discretion.

## 2019-10-17 ENCOUNTER — Other Ambulatory Visit: Payer: Self-pay | Admitting: Internal Medicine

## 2019-10-17 MED ORDER — ONDANSETRON 4 MG PO TBDP: 4.0000 mg | ORAL_TABLET | Freq: Three times a day (TID) | ORAL | 0 refills | Status: DC | PRN

## 2019-10-22 ENCOUNTER — Other Ambulatory Visit: Payer: Self-pay | Admitting: Internal Medicine

## 2019-10-22 MED ORDER — PHENYTOIN SODIUM EXTENDED 200 MG PO CAPS: 200.0000 mg | ORAL_CAPSULE | Freq: Two times a day (BID) | ORAL | 3 refills | Status: DC

## 2019-10-24 ENCOUNTER — Encounter: Payer: Self-pay | Admitting: Family Medicine

## 2019-10-24 ENCOUNTER — Ambulatory Visit: Payer: Medicaid Other | Attending: Family Medicine | Admitting: Family Medicine

## 2019-10-24 ENCOUNTER — Ambulatory Visit: Payer: Medicaid Other | Admitting: Physical Therapy

## 2019-10-24 ENCOUNTER — Other Ambulatory Visit: Payer: Self-pay

## 2019-10-24 DIAGNOSIS — G8929 Other chronic pain: Secondary | ICD-10-CM | POA: Diagnosis not present

## 2019-10-24 DIAGNOSIS — M79605 Pain in left leg: Secondary | ICD-10-CM | POA: Diagnosis not present

## 2019-10-24 MED ORDER — CYCLOBENZAPRINE HCL 10 MG PO TABS: 10.0000 mg | ORAL_TABLET | Freq: Two times a day (BID) | ORAL | 3 refills | Status: DC | PRN

## 2019-10-24 MED ORDER — MELOXICAM 7.5 MG PO TABS: 7.5000 mg | ORAL_TABLET | Freq: Every day | ORAL | 1 refills | Status: DC

## 2019-10-24 NOTE — Progress Notes (Signed)
States that he want to get another [place] for his left leg due to having pain.

## 2019-10-24 NOTE — Progress Notes (Signed)
Virtual Visit via Telephone Note  I connected with Joshua [name], on 10/24/2019 at 9:27 AM by telephone due to the COVID-19 pandemic and verified that I am speaking with the correct person using two identifiers.   Consent: I discussed the limitations, risks, security and privacy concerns of performing an evaluation and management service by telephone and the availability of in person appointments. I also discussed with the patient that there may be a patient responsible charge related to this service. The patient expressed understanding and agreed to proceed.   Location of Patient: Home  Location of Provider: Clinic   Persons participating in Telemedicine visit: Janie Missel Farrington-CMA Dr. Margarita Rana     History of Present Illness: Joshua [name] is a71 year old right-handed male male with a history of meningioma status post resection in 2014 hospitalized at [facility] from 01/03/2019 through 01/14/2019 for recurrent meningioma status post resection(the last of which was in 12/2018), seizureshere for an acute visit   He complains of L leg throbbing for the last couple of weeks. He is currently undergoing rehab. It cramps up sometimes and he uses an Advil and at other times uses Flexeril. He has used compression stockings which have been beneficial with regards to edema.  He is wondering if he has a lump on his left leg.  Review of his chart indicates negative Doppler from 07/2019 and he has complained of chronic left leg pain.  He needs an alcohol assessment so he can obtain his license back. He quit alcohol a year ago and he has not had any recent seizures.  Past Medical History:  Diagnosis Date  . Brain tumor ([place]) 02/20/2013   brain tumor removed in March 2014, Dr Donald Pore  . Dizziness   . Enlarged prostate   . Headache(784.0)    scattered  . Meningioma ([place])   . Seizures ([place])    No Known Allergies  Current Outpatient Medications on File Prior to  Visit  Medication Sig Dispense Refill  . aspirin EC 81 MG EC tablet Take 1 tablet (81 mg total) by mouth daily. 30 tablet 3  . clonazePAM (KLONOPIN) 0.5 MG tablet Take 1 tablet (0.5 mg total) by mouth 2 (two) times daily. 60 tablet 1  . cyclobenzaprine (FLEXERIL) 10 MG tablet Take 1 tablet (10 mg total) by mouth 2 (two) times daily as needed for muscle spasms. 60 tablet 3  . FIBER ADULT GUMMIES PO Take 1 tablet by mouth daily.    Marland Kitchen lamoTRIgine (LAMICTAL) 100 MG tablet Take 2 tablets (200 mg total) by mouth 2 (two) times daily. 120 tablet 2  . LORazepam (ATIVAN) 1 MG tablet Take 1 tablet (1 mg total) by mouth every 8 (eight) hours as needed for seizure (for breakthrough seizures). 30 tablet 0  . meloxicam (MOBIC) 7.5 MG tablet Take 1 tablet (7.5 mg total) by mouth daily. 30 tablet 1  . Miconazole Nitrate 2 % AERP Apply to affected area BID as needed 85 g 0  . ondansetron (ZOFRAN-ODT) 4 MG disintegrating tablet Take 1 tablet (4 mg total) by mouth every 8 (eight) hours as needed for nausea or vomiting. 30 tablet 0  . phenytoin (DILANTIN) 200 MG ER capsule Take 1 capsule (200 mg total) by mouth 2 (two) times daily. 60 capsule 3  . vitamin C (ASCORBIC ACID) 500 MG tablet Take 500 mg by mouth daily.    . furosemide (LASIX) 20 MG tablet Take 1 tablet (20 mg total) by mouth daily. Prn swelling.  Use sparingly (Patient not taking: Reported on 10/24/2019) 30 tablet 1  . ibuprofen (ADVIL) 600 MG tablet Take 1 tablet (600 mg total) by mouth every 6 (six) hours as needed. (Patient not taking: Reported on 10/24/2019) 30 tablet 0  . mirtazapine (REMERON) 15 MG tablet Take 1 tablet (15 mg total) by mouth at bedtime. (Patient not taking: Reported on 10/24/2019) 30 tablet 3  . sucralfate (CARAFATE) 1 g tablet Take 1 tablet (1 g total) by mouth 4 (four) times daily -  with meals and at bedtime for 14 days. 56 tablet 0  . [DISCONTINUED] cetirizine (ZYRTEC) 10 MG tablet Take 1 tablet (10 mg total) by mouth daily. 30 tablet 11   . [DISCONTINUED] prochlorperazine (COMPAZINE) 10 MG tablet Take 1 tablet (10 mg total) by mouth every 6 (six) hours as needed for nausea or vomiting. (Patient not taking: Reported on 09/19/2019) 30 tablet 1   No current facility-administered medications on file prior to visit.    Observations/Objective: Alert, awake, x3 No acute distress  Assessment and Plan: 1. Left leg pain Pain is chronic Improved on Advil and Flexeril Low suspicion for DVT; negative Doppler in 07/2019 Continue with compression stocking - cyclobenzaprine (FLEXERIL) 10 MG tablet; Take 1 tablet (10 mg total) by mouth 2 (two) times daily as needed for muscle spasms.  Dispense: 60 tablet; Refill: 3 - meloxicam (MOBIC) 7.5 MG tablet; Take 1 tablet (7.5 mg total) by mouth daily.  Dispense: 30 tablet; Refill: 1  Advised to obtain information regarding alcohol assessment from the agency that had required him to do so.  I have informed him I unable to refer to alcohol Anonymous but this is not indicated at this time as he is no longer actively consuming alcohol.  Follow Up Instructions: Keep previously scheduled appointment   I discussed the assessment and treatment plan with the patient. The patient was provided an opportunity to ask questions and all were answered. The patient agreed with the plan and demonstrated an understanding of the instructions.   The patient was advised to call back or seek an in-person evaluation if the symptoms worsen or if the condition fails to improve as anticipated.     I provided 11 minutes total of non-face-to-face time during this encounter including median intraservice time, reviewing previous notes, investigations, ordering medications, medical decision making, coordinating care and patient verbalized understanding at the end of the visit.     Charlott Rakes, MD, FAAFP. [facility] [place], [place]   10/24/2019, 9:27 AM

## 2019-10-31 ENCOUNTER — Inpatient Hospital Stay: Payer: Medicaid Other | Attending: Internal Medicine | Admitting: Internal Medicine

## 2019-10-31 DIAGNOSIS — R569 Unspecified convulsions: Secondary | ICD-10-CM | POA: Diagnosis not present

## 2019-10-31 DIAGNOSIS — C700 Malignant neoplasm of cerebral meninges: Secondary | ICD-10-CM

## 2019-10-31 MED ORDER — LAMOTRIGINE 100 MG PO TABS: 200.0000 mg | ORAL_TABLET | Freq: Two times a day (BID) | ORAL | 2 refills | Status: DC

## 2019-10-31 NOTE — Progress Notes (Signed)
I connected with Joshua [name] on 10/31/19 at  9:00 AM EDT by telephone visit and verified that I am speaking with the correct person using two identifiers.  I discussed the limitations, risks, security and privacy concerns of performing an evaluation and management service by telemedicine and the availability of in-person appointments. I also discussed with the patient that there may be a patient responsible charge related to this service. The patient expressed understanding and agreed to proceed.  Other persons participating in the visit and their role in the encounter:  n/a  Patient's location:  Home  Provider's location:  Office  Chief Complaint:  Seizures  History of Present Ilness: Joshua [name] experienced only one breakthrough seizure, with typical semiology, on 10/27/19, did not require ED visit.  At present he is at his baseline.  He feels his mood and irritability is improved since discontinuing the Keppra Observations: Language and cognition normal Assessment and Plan: Breakthrough seizure.  No other provocation; patient has since been stable without seizure recurrence. Follow Up Instructions: Con't current regimen, Dilantin 200mg  BID and Lamictal 200mg  BID.  We ask that Joshua [name] return to clinic in 2 months following next brain MRI, or sooner as needed.  I discussed the assessment and treatment plan with the patient.  The patient was provided an opportunity to ask questions and all were answered.  The patient agreed with the plan and demonstrated understanding of the instructions.    The patient was advised to call back or seek an in-person evaluation if the symptoms worsen or if the condition fails to improve as anticipated.  I provided 5-10 minutes of non-face-to-face time during this enocunter.  [name], MD   I provided 12 minutes of non face-to-face telephone visit time during this encounter, and > 50% was spent counseling as documented under my assessment &  plan.

## 2019-11-01 ENCOUNTER — Other Ambulatory Visit: Payer: Self-pay

## 2019-11-01 ENCOUNTER — Ambulatory Visit: Payer: Medicaid Other | Attending: Internal Medicine

## 2019-11-01 ENCOUNTER — Telehealth: Payer: Self-pay | Admitting: Internal Medicine

## 2019-11-01 ENCOUNTER — Ambulatory Visit: Payer: Medicaid Other | Admitting: Rehabilitative and Restorative Service Providers"

## 2019-11-01 DIAGNOSIS — R2689 Other abnormalities of gait and mobility: Secondary | ICD-10-CM

## 2019-11-01 DIAGNOSIS — M25651 Stiffness of right hip, not elsewhere classified: Secondary | ICD-10-CM

## 2019-11-01 DIAGNOSIS — M6281 Muscle weakness (generalized): Secondary | ICD-10-CM | POA: Diagnosis not present

## 2019-11-01 DIAGNOSIS — M25551 Pain in right hip: Secondary | ICD-10-CM

## 2019-11-01 NOTE — Telephone Encounter (Signed)
Appt already scheduled per 4/15 los.

## 2019-11-01 NOTE — Therapy (Signed)
[facility] [address] [place], [place], [address] Phone: [phone]   Fax:  [phone]  Physical Therapy Treatment/Re-Cert  Patient Details  Name: Joshua [name] MRN: [id number] Date of Birth: 09/04/1960 Referring Provider (PT): Cecil Cobbs   Encounter Date: 11/01/2019  PT End of Session - 11/01/19 0815    Visit Number  10    Number of Visits  28    Date for PT Re-Evaluation  01/30/20   POC for 9 weeks, Cert for 90 days   Authorization Type  Medicaid 3 visits 06/03/11/02/2019, 12/10-12/30/20 6 units, 1/7-1/27/2021 3 visits, 1/29-3/4 10 visits, 3 visits 3/8-3/28, 3 visits 4/16-5/6    Authorization - Visit Number  1    Authorization - Number of Visits  3    PT Start Time  0806    PT Stop Time  0846    PT Time Calculation (min)  40 min    Equipment Utilized During Treatment  Gait belt    Activity Tolerance  Patient tolerated treatment well;No increased pain    Behavior During Therapy  WFL for tasks assessed/performed       Past Medical History:  Diagnosis Date  . Brain tumor ([place]) 02/20/2013   brain tumor removed in March 2014, Dr Donald Pore  . Dizziness   . Enlarged prostate   . Headache(784.0)    scattered  . Meningioma ([facility])   . Seizures ([place])     Past Surgical History:  Procedure Laterality Date  . APPLICATION OF CRANIAL NAVIGATION N/A 01/10/2019   Procedure: APPLICATION OF CRANIAL NAVIGATION;  Surgeon: Erline Levine, MD;  Location: [facility];  Service: Neurosurgery;  Laterality: N/A;  . CATARACT EXTRACTION Right   . CRANIOTOMY Right 11/06/2012   Procedure: CRANIOTOMY TUMOR EXCISION;  Surgeon: Erline Levine, MD;  Location: [facility];  Service: Neurosurgery;  Laterality: Right;  Right Parasagittal craniotomy for meningioma with Stealth  . CRANIOTOMY Right 01/10/2019   Procedure: Right Parasagittal Craniotomy for Tumor;  Surgeon: Erline Levine, MD;  Location: [facility];  Service: Neurosurgery;  Laterality:  Right;  Right parasagittal craniotomy for tumor  . ESOPHAGOGASTRODUODENOSCOPY    . right knee arthroscopy      There were no vitals filed for this visit.  Subjective Assessment - 11/01/19 0810    Subjective  Pt. reporting that he feet was swelling, had an ultrasound to assess for blood clots with negative result. Swelling has improved with the compression socks and elevating the legs. Has not been wearing brace. No falls. Still reports back pain, that comes/goes intermittently. Did have a seizure on yesterday and last monday.    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 History of right THA on 2019.    Patient Stated Goals  Pt would like to be able to elimate his cane use.    Currently in Pain?  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      Standardized Balance Assessment   Standardized Balance Assessment  Timed Up and Go Test      Timed Up and Go Test   TUG  Normal TUG    Normal TUG (seconds)  11.81   w/ AD     Functional  Gait  Assessment   Gait assessed   Yes    Gait Level Surface  Walks 20 ft, slow speed, abnormal gait pattern, evidence for imbalance or deviates 10-15 in outside of the 12 in walkway width. Requires more than 7 sec to ambulate 20 ft.    Change in Gait Speed  Able to smoothly change walking speed without loss of balance or gait deviation. Deviate no more than 6 in outside of the 12 in walkway width.    Gait with Horizontal Head Turns  Performs head turns smoothly with no change in gait. Deviates no more than 6 in outside 12 in walkway width    Gait with Vertical Head Turns  Performs head turns with no change in gait. Deviates no more than 6 in outside 12 in walkway width.    Gait and  Pivot Turn  Pivot turns safely in greater than 3 sec and stops with no loss of balance, or pivot turns safely within 3 sec and stops with mild imbalance, requires small steps to catch balance.    Step Over Obstacle  Is able to step over one shoe box (4.5 in total height) without changing gait speed. No evidence of imbalance.    Gait with Narrow Base of Support  Ambulates less than 4 steps heel to toe or cannot perform without assistance.    Gait with Eyes Closed  Walks 20 ft, slow speed, abnormal gait pattern, evidence for imbalance, deviates 10-15 in outside 12 in walkway width. Requires more than 9 sec to ambulate 20 ft.    Ambulating Backwards  Walks 20 ft, uses assistive device, slower speed, mild gait deviations, deviates 6-10 in outside 12 in walkway width.    Steps  Two feet to a stair, must use rail.    Total Score  18    FGA comment:  18/30                           PT Education - 11/01/19 0903    Education Details  PT discussed progress toward long term goals, educated on POC with patient agreeing that he would like to recieve more PT after this initial authorization period.    Person(s) Educated  Patient    Methods  Explanation    Comprehension  Verbalized understanding       PT Short Term Goals - 09/13/19 1123      PT SHORT TERM GOAL #1   Title  STG=LTG        PT Long Term Goals - 11/01/19 0816      PT LONG TERM GOAL #1   Title  Pt will increase FGA from 21/30 to >23/30 for improved balance and gait safety.    Baseline  21/30 on 09/13/19, 18/30 on 4/16    Time  4    Period  Weeks    Status  New      PT LONG TERM GOAL #2   Title  Pt will ambulate >700' on varied surfaces independently without AD for improved community mobility.    Baseline  Pt ambulated 575' without AD today at supervision level. Currently using SPC outside of home.    Time  4    Period  Weeks    Status  Revised       Updated Short Term Goals:  PT Short Term Goals - 11/01/19  0915      PT SHORT TERM GOAL #1   Title  Pt will be independent with HEP to continue strength and balance gains on own.    Baseline  HEP needed to reviewed/modified    Time  4    Period  Weeks    Status  New    Target Date  11/29/19      PT SHORT TERM GOAL #2   Title  Pt will reduce TUG to <10 seconds w/ AD to demonstrate improved ambulation and further reduce risk for falls.    Baseline  11.81 seconds w/o AD    Time  4    Period  Weeks    Status  New    Target Date  11/29/19      Updated Long Term Goals:  PT Long Term Goals - 11/01/19 0917      PT LONG TERM GOAL #1   Title  Pt will increase FGA from 21/30 to >23/30 for improved balance and gait safety.    Baseline  21/30 on 09/13/19, 18/30 on 4/16    Time  9    Period  Weeks    Status  New    Target Date  01/03/20      PT LONG TERM GOAL #2   Title  Pt will ambulate >700' on varied surfaces independently without AD for improved community mobility.    Baseline  Ambulated 500' without AD today, intermittent CGA. Currently using SPC inside/outside of home.    Time  9    Period  Weeks    Status  New    Target Date  01/03/20      PT LONG TERM GOAL #3   Title  Pt will be independent with progressive HEP to continue strength and balance gains on own.    Baseline  HEP continued to be reassesed    Time  9    Period  Weeks    Status  New    Target Date  01/03/20           Plan - 11/01/19 0906    Clinical Impression Statement  Today's session focused on reassessment of LTG due to length of absense from PT services. Patient scoring a 18/30 on the FGA with increased difficulty with tandem walking, stairs, gait with eyes closed, and stairs. Patient's score previously was 21/30, showing a regression in balance since last assessment due to absense of therapy. FGA was completed without use of AD. Patient currently reports ambulating with a SPC throughout the house and in the community. Patient demonstrates low gait speed at this  time. Pt. will benefit from skilled PT services to address balance deficits, gait impairments and improve functional mobility.    Examination-Activity Limitations  Locomotion Level;Stairs;Dressing    Examination-Participation Restrictions  Community Activity    Stability/Clinical Decision Making  Evolving/Moderate complexity    Rehab Potential  Good    PT Frequency  2x / week    PT Duration  9 weeks    PT Treatment/Interventions  ADLs/Self Care Home Management;DME Instruction;Gait training;Stair training;Functional mobility training;Therapeutic activities;Patient/family education;Neuromuscular re-education;Balance training;Therapeutic exercise;Orthotic Fit/Training;Manual techniques;Passive range of motion;Aquatic Therapy    PT Next Visit Plan  Revise HEP. Personnel officer, Hotel manager, Primary school teacher. Assess Gait Speed    Consulted and Agree with Plan of Care  Patient       Patient will benefit from skilled therapeutic intervention in order to improve the following deficits and impairments:  Abnormal gait, Decreased activity tolerance, Decreased balance, Difficulty walking, Decreased strength, Impaired sensation, Postural dysfunction, Impaired flexibility, Decreased  mobility, Decreased range of motion  Visit Diagnosis: Other abnormalities of gait and mobility  Muscle weakness (generalized)  Pain in right hip  Stiffness of right hip, not elsewhere classified     Problem List Patient Active Problem List   Diagnosis Date Noted  . 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[facility]) 03/07/2019  . Seizures ([facility]) 01/31/2019  . Meningioma, recurrent of brain ([facility]) 01/31/2019  . Meningioma ([facility]) 01/03/2019  . Brain tumor ([facility]) 12/27/2018  . Diffuse idiopathic skeletal hyperostosis 06/05/2013    Jones Bales, PT, DPT 11/01/2019, 9:14 AM  [facility] [address] [place], [place], [address] Phone: [phone]   Fax:   [phone]  Name: Joshua [name] MRN: [id number] Date of Birth: [birth year]

## 2019-11-02 ENCOUNTER — Encounter (HOSPITAL_COMMUNITY): Payer: Self-pay

## 2019-11-02 ENCOUNTER — Emergency Department (HOSPITAL_COMMUNITY): Payer: Medicaid Other

## 2019-11-02 ENCOUNTER — Ambulatory Visit (HOSPITAL_COMMUNITY)
Admission: EM | Admit: 2019-11-02 | Discharge: 2019-11-02 | Disposition: A | Discharge status: Home / Self Care | Payer: Medicaid Other

## 2019-11-02 ENCOUNTER — Emergency Department (HOSPITAL_COMMUNITY)
Admission: EM | Admit: 2019-11-02 | Discharge: 2019-11-02 | Disposition: A | Discharge status: Home / Self Care | Payer: Medicaid Other | Attending: Emergency Medicine | Admitting: Emergency Medicine

## 2019-11-02 ENCOUNTER — Other Ambulatory Visit: Payer: Self-pay

## 2019-11-02 DIAGNOSIS — Z7982 Long term (current) use of aspirin: Secondary | ICD-10-CM | POA: Diagnosis not present

## 2019-11-02 DIAGNOSIS — R42 Dizziness and giddiness: Secondary | ICD-10-CM | POA: Insufficient documentation

## 2019-11-02 DIAGNOSIS — E860 Dehydration: Secondary | ICD-10-CM | POA: Diagnosis not present

## 2019-11-02 DIAGNOSIS — Z87891 Personal history of nicotine dependence: Secondary | ICD-10-CM | POA: Diagnosis not present

## 2019-11-02 DIAGNOSIS — Z79899 Other long term (current) drug therapy: Secondary | ICD-10-CM | POA: Diagnosis not present

## 2019-11-02 DIAGNOSIS — R55 Syncope and collapse: Secondary | ICD-10-CM | POA: Diagnosis not present

## 2019-11-02 DIAGNOSIS — R531 Weakness: Secondary | ICD-10-CM | POA: Diagnosis not present

## 2019-11-02 LAB — I-STAT CHEM 8, ED
BUN: 12 mg/dL (ref 6–20)
Calcium, Ion: 1.18 mmol/L (ref 1.15–1.40)
Chloride: 102 mmol/L (ref 98–111)
Creatinine, Ser: 0.8 mg/dL (ref 0.61–1.24)
Glucose, Bld: 108 mg/dL — ABNORMAL HIGH (ref 70–99)
HCT: 48 % (ref 39.0–52.0)
Hemoglobin: 16.3 g/dL (ref 13.0–17.0)
Potassium: 4.7 mmol/L (ref 3.5–5.1)
Sodium: 141 mmol/L (ref 135–145)
TCO2: 30 mmol/L (ref 22–32)

## 2019-11-02 LAB — CBC
HCT: 47.8 % (ref 39.0–52.0)
Hemoglobin: 15.8 g/dL (ref 13.0–17.0)
MCH: 30.5 pg (ref 26.0–34.0)
MCHC: 33.1 g/dL (ref 30.0–36.0)
MCV: 92.3 fL (ref 80.0–100.0)
Platelets: 248 10*3/uL (ref 150–400)
RBC: 5.18 MIL/uL (ref 4.22–5.81)
RDW: 12.8 % (ref 11.5–15.5)
WBC: 5.6 10*3/uL (ref 4.0–10.5)
nRBC: 0 % (ref 0.0–0.2)

## 2019-11-02 LAB — COMPREHENSIVE METABOLIC PANEL
ALT: 33 U/L (ref 0–44)
AST: 22 U/L (ref 15–41)
Albumin: 4.3 g/dL (ref 3.5–5.0)
Alkaline Phosphatase: 86 U/L (ref 38–126)
Anion gap: 12 (ref 5–15)
BUN: 10 mg/dL (ref 6–20)
CO2: 27 mmol/L (ref 22–32)
Calcium: 9.3 mg/dL (ref 8.9–10.3)
Chloride: 103 mmol/L (ref 98–111)
Creatinine, Ser: 0.97 mg/dL (ref 0.61–1.24)
GFR calc Af Amer: >60 mL/min (ref >60)
GFR calc non Af Amer: >60 mL/min (ref >60)
Glucose, Bld: 123 mg/dL — ABNORMAL HIGH (ref 70–99)
Potassium: 4.7 mmol/L (ref 3.5–5.1)
Sodium: 142 mmol/L (ref 135–145)
Total Bilirubin: 0.4 mg/dL (ref 0.3–1.2)
Total Protein: 7.2 g/dL (ref 6.5–8.1)

## 2019-11-02 LAB — DIFFERENTIAL
Abs Immature Granulocytes: 0.01 10*3/uL (ref 0.00–0.07)
Basophils Absolute: 0.1 10*3/uL (ref 0.0–0.1)
Basophils Relative: 1 %
Eosinophils Absolute: 0.2 10*3/uL (ref 0.0–0.5)
Eosinophils Relative: 3 %
Immature Granulocytes: 0 %
Lymphocytes Relative: 38 %
Lymphs Abs: 2.1 10*3/uL (ref 0.7–4.0)
Monocytes Absolute: 0.6 10*3/uL (ref 0.1–1.0)
Monocytes Relative: 11 %
Neutro Abs: 2.6 10*3/uL (ref 1.7–7.7)
Neutrophils Relative %: 47 %

## 2019-11-02 LAB — CBG MONITORING, ED: Glucose-Capillary: 162 mg/dL — ABNORMAL HIGH (ref 70–99)

## 2019-11-02 LAB — PROTIME-INR
INR: 1.1 (ref 0.8–1.2)
Prothrombin Time: 14 seconds (ref 11.4–15.2)

## 2019-11-02 LAB — APTT: aPTT: 28 seconds (ref 24–36)

## 2019-11-02 MED ORDER — SODIUM CHLORIDE 0.9% FLUSH: 3.0000 mL | Freq: Once | INTRAVENOUS | Status: DC

## 2019-11-02 MED ORDER — SODIUM CHLORIDE 0.9 % IV BOLUS
1000.0000 mL | Freq: Once | INTRAVENOUS | Status: AC
  Administered 2019-11-02: 1000 mL via INTRAVENOUS

## 2019-11-02 NOTE — Discharge Instructions (Signed)
Your work-up today was consistent with some dehydration likely contributing to the near syncopal episode and lightheadedness she experienced earlier.  I suspect it was due to a combination of the alcohol yesterday leading to dehydration, not eating and drinking very much this morning, and the recent medication changes you had increasing the benzodiazepines you are taking.  All this together likely cause her to be fatigued.  After fluids and monitoring, your symptoms are resolved and you are feeling much better.  You most likely metabolize the alcohol that are no longer dehydrated.  The CT of your head did not show new bleeding or abnormality and as we discussed together, we agreed to hold on further extensive work-up.  Please follow-up with your primary team and try to push hydration.  If any symptoms change or worsen, please return to nearest emergency department immediately.

## 2019-11-02 NOTE — ED Notes (Signed)
Bed: UCTR Expected date:  Expected time:  Means of arrival:  Comments: Rooms 8-10 COVID

## 2019-11-02 NOTE — ED Notes (Signed)
Patient verbalizes understanding of discharge instructions. Opportunity for questioning and answers were provided. Armband removed by staff, pt discharged from ED.  

## 2019-11-02 NOTE — ED Provider Notes (Signed)
[facility] EMERGENCY DEPARTMENT Provider Note   CSN: [id number] Arrival date & time: 11/02/19  1438     History No chief complaint on file.   Joshua [name] is a 59 y.o. male.  The history is provided by the patient and medical records. No language interpreter was used.  Dizziness Quality:  Lightheadedness Severity:  Moderate Onset quality:  Gradual Duration:  1 day Timing:  Rare Progression:  Partially resolved Chronicity:  New Context: standing up   Context: not with loss of consciousness   Relieved by:  Nothing Worsened by:  Standing up Ineffective treatments:  None tried Associated symptoms: weakness   Associated symptoms: no chest pain, no diarrhea, no headaches, no hearing loss, no nausea, no palpitations, no shortness of breath, no vision changes and no vomiting        Past Medical History:  Diagnosis Date  . Brain tumor ([place]) 02/20/2013   brain tumor removed in March 2014, Dr Donald Pore  . Dizziness   . Enlarged prostate   . Headache(784.0)    scattered  . Meningioma ([place])   . Seizures [facility]: [facility] Rhonda W)     Patient Active Problem List   Diagnosis Date Noted  . 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 Seizures ([place]) 01/31/2019  . Meningioma, recurrent of brain ([place]) 01/31/2019  . Meningioma ([place]) 01/03/2019  . Brain tumor ([place]) 12/27/2018  . Diffuse idiopathic skeletal hyperostosis 06/05/2013    Past Surgical History:  Procedure Laterality Date  . APPLICATION OF CRANIAL NAVIGATION N/A 01/10/2019   Procedure: APPLICATION OF CRANIAL NAVIGATION;  Surgeon: Erline Levine, MD;  Location: [facility];  Service: Neurosurgery;  Laterality: N/A;  . CATARACT EXTRACTION Right   . CRANIOTOMY Right 11/06/2012   Procedure: CRANIOTOMY TUMOR EXCISION;  Surgeon: Erline Levine, MD;  Location: [facility]URO ORS;  Service: Neurosurgery;  Laterality: Right;  Right Parasagittal craniotomy for meningioma with Stealth  . CRANIOTOMY Right 01/10/2019   Procedure: Right Parasagittal Craniotomy for Tumor;  Surgeon: Erline Levine, MD;  Location: [facility];  Service: Neurosurgery;  Laterality: Right;  Right parasagittal craniotomy for tumor  . ESOPHAGOGASTRODUODENOSCOPY    . right knee arthroscopy         Family History  Problem Relation Age of Onset  . Hypertension Mother   . Hypertension Father     Social History   Tobacco Use  . Smoking status: Former Smoker    Types: Cigarettes    Quit date: 01/29/2019    Years since quitting: 0.7  . Smokeless tobacco: Never Used  . Tobacco comment: hasn't smoked in a week  Substance Use Topics  . Alcohol use: Yes    Alcohol/week: 8.0 standard drinks    Types: 6 Cans of beer, 2 Shots of liquor per week    Comment: occasionally  . Drug use: No    Home Medications Prior to Admission medications   Medication Sig Start Date End Date Taking? Authorizing Provider  aspirin EC 81 MG EC tablet Take 1 tablet (81 mg total) by mouth daily. 01/14/19   Erline Levine, MD  clonazePAM (KLONOPIN) 0.5 MG tablet Take 1 tablet (0.5 mg total) by mouth 2 (two) times daily. 10/14/19   Vaslow, Acey Lav, MD  cyclobenzaprine (FLEXERIL) 10 MG tablet Take 1 tablet (10 mg total) by mouth 2 (two) times daily as needed for muscle spasms. 10/24/19   Charlott Rakes, MD  FIBER ADULT GUMMIES PO Take 1 tablet by mouth daily.    [provider]  furosemide (LASIX) 20 MG tablet Take 1 tablet (20 mg total) by mouth daily. Prn swelling.  Use sparingly Patient not taking: Reported on 10/24/2019 08/22/19   Argentina Donovan, PA-C  lamoTRIgine (LAMICTAL) 100 MG tablet Take 2 tablets (200 mg total) by mouth 2 (two) times daily. 10/31/19   Vaslow, Acey Lav, MD  LORazepam (ATIVAN) 1 MG tablet Take 1 tablet (1 mg total) by mouth every 8 (eight) hours as needed for seizure (for breakthrough seizures). 10/14/19   [name], MD  meloxicam (MOBIC) 7.5 MG tablet Take 1 tablet (7.5 mg total) by mouth daily. 10/24/19   Charlott Rakes, MD    Miconazole Nitrate 2 % AERP Apply to affected area BID as needed 10/15/19   Lamptey, Myrene Galas, MD  mirtazapine (REMERON) 15 MG tablet Take 1 tablet (15 mg total) by mouth at bedtime. Patient not taking: Reported on 10/24/2019 10/11/19   [name], MD  ondansetron (ZOFRAN-ODT) 4 MG disintegrating tablet Take 1 tablet (4 mg total) by mouth every 8 (eight) hours as needed for nausea or vomiting. 10/17/19   [name], MD  phenytoin (DILANTIN) 200 MG ER capsule Take 1 capsule (200 mg total) by mouth 2 (two) times daily. 10/22/19   Vaslow, Acey Lav, MD  sucralfate (CARAFATE) 1 g tablet Take 1 tablet (1 g total) by mouth 4 (four) times daily -  with meals and at bedtime for 14 days. 07/23/19 10/02/19  Charlott Rakes, MD  vitamin C (ASCORBIC ACID) 500 MG tablet Take 500 mg by mouth daily.    [provider]  cetirizine (ZYRTEC) 10 MG tablet Take 1 tablet (10 mg total) by mouth daily. 08/08/19 09/08/19  Argentina Donovan, PA-C  prochlorperazine (COMPAZINE) 10 MG tablet Take 1 tablet (10 mg total) by mouth every 6 (six) hours as needed for nausea or vomiting. Patient not taking: Reported on 09/19/2019 03/08/19 10/15/19  Kyung Rudd, MD    Allergies    Patient has no known allergies.  Review of Systems   Review of Systems  Constitutional: Positive for fatigue. Negative for chills, diaphoresis and fever.  HENT: Negative for congestion and hearing loss.   Eyes: Negative for visual disturbance.  Respiratory: Negative for cough, chest tightness and shortness of breath.   Cardiovascular: Negative for chest pain and palpitations.  Gastrointestinal: Negative for abdominal pain, constipation, diarrhea, nausea and vomiting.  Genitourinary: Negative for dysuria and frequency.  Musculoskeletal: Negative for back pain, neck pain and neck stiffness.  Skin: Negative for rash and wound.  Neurological: Positive for weakness and light-headedness. Negative for dizziness, syncope, numbness and headaches.   Psychiatric/Behavioral: Negative for agitation and confusion.  All other systems reviewed and are negative.   Physical Exam Updated Vital Signs BP (!) 148/95   Pulse 67   Temp 98.5 F (36.9 C) (Oral)   Resp 15   Ht 5\' 9"  (1.753 m)   Wt 81.6 kg   SpO2 97%   BMI 26.58 kg/m   Physical Exam Vitals and nursing note reviewed.  Constitutional:      General: He is not in acute distress.    Appearance: He is well-developed. He is not ill-appearing, toxic-appearing or diaphoretic.  HENT:     Head: Normocephalic and atraumatic.     Nose: Nose normal. No congestion or rhinorrhea.     Mouth/Throat:     Mouth: Mucous membranes are moist.     Pharynx: No oropharyngeal exudate or posterior oropharyngeal erythema.  Eyes:  Conjunctiva/sclera: Conjunctivae normal.     Pupils: Pupils are equal, round, and reactive to light.  Cardiovascular:     Rate and Rhythm: Normal rate and regular rhythm.     Pulses: Normal pulses.     Heart sounds: No murmur.  Pulmonary:     Effort: Pulmonary effort is normal. No respiratory distress.     Breath sounds: Normal breath sounds. No wheezing, rhonchi or rales.  Chest:     Chest wall: No tenderness.  Abdominal:     General: Abdomen is flat.     Palpations: Abdomen is soft.     Tenderness: There is no abdominal tenderness. There is no right CVA tenderness, left CVA tenderness, guarding or rebound.  Musculoskeletal:        General: No tenderness.     Cervical back: Neck supple. No tenderness.     Right lower leg: No edema.     Left lower leg: No edema.  Skin:    General: Skin is warm and dry.  Neurological:     Mental Status: He is alert and oriented to person, place, and time. Mental status is at baseline.     Sensory: No sensory deficit.     Motor: Weakness present.  Psychiatric:        Mood and Affect: Mood normal.     ED Results / Procedures / Treatments   Labs (all labs ordered are listed, but only abnormal results are  displayed) Labs Reviewed  COMPREHENSIVE METABOLIC PANEL - Abnormal; Notable for the following components:      Result Value   Glucose, Bld 123 (*)    All other components within normal limits  I-STAT CHEM 8, ED - Abnormal; Notable for the following components:   Glucose, Bld 108 (*)    All other components within normal limits  CBG MONITORING, ED - Abnormal; Notable for the following components:   Glucose-Capillary 162 (*)    All other components within normal limits  PROTIME-INR  APTT  CBC  DIFFERENTIAL    EKG EKG Interpretation  Date/Time:  Saturday November 02 2019 15:22:37 EDT Ventricular Rate:  87 PR Interval:  154 QRS Duration: 102 QT Interval:  372 QTC Calculation: 447 R Axis:   129 Text Interpretation: Normal sinus rhythm Incomplete right bundle branch block Possible Right ventricular hypertrophy Abnormal ECG When compared to prior, similar appareance. No STEMI Confirmed by Antony Blackbird [phone]) on 11/02/2019 6:39:43 PM   Radiology CT HEAD WO CONTRAST  Result Date: 11/02/2019 CLINICAL DATA:  Pre syncopal episode today. EXAM: CT HEAD WITHOUT CONTRAST TECHNIQUE: Contiguous axial images were obtained from the base of the skull through the vertex without intravenous contrast. COMPARISON:  02/18/2019 and brain MR dated 08/30/2019. FINDINGS: Brain: Improved postsurgical changes in both cerebral hemispheres at the skull vertex. The previously demonstrated residual nodular area at the posterior aspect of the excised meningioma is not visible on today's noncontrast CT images. The ventricles remain normal in size and position. No intracranial hemorrhage, mass lesion or CT evidence of acute infarction. Vascular: No hyperdense vessel or unexpected calcification. Skull: Stable bilateral post craniotomy changes at the skull vertex, extending more toward the right and toward the left Sinuses/Orbits: Status post bilateral cataract extraction. Unremarkable bones and included paranasal sinuses.  Other: None. IMPRESSION: 1. No acute abnormality. 2. Improved postsurgical changes in both cerebral hemispheres at the skull vertex. 3. The previously demonstrated residual nodular area at the posterior aspect of the excised meningioma is not visible on today's noncontrast CT  images. Electronically Signed   By: Claudie Revering M.D.   On: 11/02/2019 15:44    Procedures Procedures (including critical care time)  Medications Ordered in ED Medications  sodium chloride flush (NS) 0.9 % injection 3 mL (has no administration in time range)  sodium chloride 0.9 % bolus 1,000 mL (0 mLs Intravenous Stopped 11/02/19 2117)    ED Course  I have reviewed the triage vital signs and the nursing notes.  Pertinent labs & imaging results that were available during my care of the patient were reviewed by me and considered in my medical decision making (see chart for details).    MDM Rules/Calculators/A&P                      CARINA ALIRE is a 59 y.o. male with a past medical history significant for prior brain tumor status post removal, seizures, and enlarged prostate who presents with near syncopal episode with lightheadedness today.  Patient reports that he had not had alcohol in "a long time" and yesterday celebrated a feeling of his birthday and drank several shots and several beers.  He says that today he did not have much to eat or drink and only had a small amount of yogurt.  He said that when he stood up today he got lightheaded and had to sit back down.  He did not pass out but felt lightheaded and near syncopal.  He denied any chest pain, palpitations or shortness of breath with it.  Denies any new headaches, vision changes, nausea, vomiting.  Denies any new focal neurologic deficits.  He reports some chronic left arm and left leg weakness from his prior tumor but otherwise denies other complaints.  He denies recent Covid exposure or any fevers or chills.  No neck pain or neck stiffness.  He just felt  lightheaded and thinks he was dehydrated after his drinking.  Of note, he also reports that his seizure medicine was changed and he is now on multiple benzos for this.  He reports he took them this morning.  On exam, no focal neurologic deficits aside from the left arm and left leg subtle weakness compared to right.  He reports it is at his baseline.  Clear speech.  Pupils are symmetric and reactive with normal extraocular movements.  Lungs clear and chest nontender.  Abdomen nontender.  Patient resting comfortably.  When patient sat up to listen to his lungs, he got lightheaded.  He clinically appears somewhat dehydrated.  Patient given some fluids and we will check labs and patient had a CT head in triage.  CT head showed no acute bleeding or new significant worsening abnormalities.  Labs overall reassuring.  Patient felt much better after fluids and was able to eat and drink.  He suspects he was lightheaded in the setting of the dehydration from alcohol and his decreased oral intake.  We discussed further work-up including MRI or further monitoring but he would like to go home.  Vital signs were reassuring and patient was observed for several hours with no worsened or current symptoms.  Patient will follow up with his PCP and neurology team.  He had no other questions or concerns and was discharged in good condition.   Final Clinical Impression(s) / ED Diagnoses Final diagnoses:  Lightheaded  Near syncope  Dehydration    Rx / DC Orders ED Discharge Orders    None     Clinical Impression: 1. Lightheaded   2. Near  syncope   3. Dehydration     Disposition: Discharge  Condition: Good  I have discussed the results, Dx and Tx plan with the pt(& family if present). He/she/they expressed understanding and agree(s) with the plan. Discharge instructions discussed at great length. Strict return precautions discussed and pt &/or family have verbalized understanding of the instructions.  No further questions at time of discharge.    Discharge Medication List as of 11/02/2019 10:41 PM      Follow Up: [facility] [id number] [place] [phone]    Charlott Rakes, [place] [address] [phone]        Marisue Canion, Gwenyth Allegra, MD 11/03/19 [phone]

## 2019-11-02 NOTE — ED Triage Notes (Addendum)
Patient complains of dizziness that started while picking up truck. Patient recently treated for brain tumor with removal in June. Patient reports increased intake today before the onset. Patient alert and oriented, dizziness with ROM. Denies pain. Speech clear, no drift, grips equal, no facial assymmetry.

## 2019-11-02 NOTE — ED Triage Notes (Signed)
Pt c/o "feeling like I was going to fall out" while standing in line at grocery store. Pt then drove here for eval. Denies CP, SOB, HA, difficulty speaking/walking;  Smile symmetrical, grips equal/strong, leg strength equal/strong against resistance.   Reports only ate 1/2 cup yogurt for breakfast. And took his clonazepam this morning as usual.   Spoke with Loura Halt, NP and informed her of v/s and patient's c/c. Loura Halt advised pt to go directly to ER STAT for higher level eval/tx. Pt verbalized understanding. Patient is being discharged from the [facility] and sent to the Emergency Department via wheelchair by staff. Per Loura Halt, NP patient is stable but in need of higher level of care due to near syncope, h/o brain tumor Patient is aware and verbalizes understanding of plan of care.  Vitals:   11/02/19 1424 11/02/19 1426  BP:  (!) 156/115  Pulse:  87  Resp: 18 18  Temp: 98.2 F (36.8 C) 98.2 F (36.8 C)  SpO2:  97%

## 2019-11-04 ENCOUNTER — Other Ambulatory Visit: Payer: Self-pay | Admitting: *Deleted

## 2019-11-04 DIAGNOSIS — D329 Benign neoplasm of meninges, unspecified: Secondary | ICD-10-CM

## 2019-11-04 NOTE — Progress Notes (Signed)
Patient called to report recent visit to ER.  States he doesn't feel like it was a seizure.  He states that he is having a stressful situation in his current living situation and he has gotten overwhelmed with anxiety several times here lately to the point that she shakes, becomes unsteady and feels off balance.  He wanted to know if he could be prescribed something for anxiety.  Upon medication review he did confirm taking Dilantin, Klonopin and Lorazapem for seizure management and per Dr. Mickeal Skinner no other medications can be added to this regimen.  Encouraged seeking counseling or guidance from our social work department.  Referrals placed.

## 2019-11-05 ENCOUNTER — Telehealth: Payer: Self-pay | Admitting: Family Medicine

## 2019-11-05 ENCOUNTER — Emergency Department (HOSPITAL_BASED_OUTPATIENT_CLINIC_OR_DEPARTMENT_OTHER): Payer: Medicaid Other

## 2019-11-05 ENCOUNTER — Encounter (HOSPITAL_COMMUNITY): Payer: Self-pay

## 2019-11-05 ENCOUNTER — Emergency Department (HOSPITAL_COMMUNITY): Payer: Medicaid Other

## 2019-11-05 ENCOUNTER — Emergency Department (HOSPITAL_COMMUNITY)
Admission: EM | Admit: 2019-11-05 | Discharge: 2019-11-05 | Disposition: A | Discharge status: Home / Self Care | Payer: Medicaid Other | Attending: Emergency Medicine | Admitting: Emergency Medicine

## 2019-11-05 ENCOUNTER — Other Ambulatory Visit: Payer: Self-pay

## 2019-11-05 DIAGNOSIS — Z87891 Personal history of nicotine dependence: Secondary | ICD-10-CM | POA: Insufficient documentation

## 2019-11-05 DIAGNOSIS — R609 Edema, unspecified: Secondary | ICD-10-CM

## 2019-11-05 DIAGNOSIS — Z7982 Long term (current) use of aspirin: Secondary | ICD-10-CM | POA: Insufficient documentation

## 2019-11-05 DIAGNOSIS — Z85841 Personal history of malignant neoplasm of brain: Secondary | ICD-10-CM | POA: Insufficient documentation

## 2019-11-05 DIAGNOSIS — F419 Anxiety disorder, unspecified: Secondary | ICD-10-CM | POA: Diagnosis not present

## 2019-11-05 DIAGNOSIS — R600 Localized edema: Secondary | ICD-10-CM | POA: Diagnosis not present

## 2019-11-05 DIAGNOSIS — Z79899 Other long term (current) drug therapy: Secondary | ICD-10-CM | POA: Insufficient documentation

## 2019-11-05 DIAGNOSIS — R55 Syncope and collapse: Secondary | ICD-10-CM | POA: Diagnosis not present

## 2019-11-05 DIAGNOSIS — R42 Dizziness and giddiness: Secondary | ICD-10-CM | POA: Insufficient documentation

## 2019-11-05 DIAGNOSIS — R519 Headache, unspecified: Secondary | ICD-10-CM | POA: Diagnosis not present

## 2019-11-05 LAB — CBC
HCT: 48.7 % (ref 39.0–52.0)
Hemoglobin: 16.4 g/dL (ref 13.0–17.0)
MCH: 31.2 pg (ref 26.0–34.0)
MCHC: 33.7 g/dL (ref 30.0–36.0)
MCV: 92.8 fL (ref 80.0–100.0)
Platelets: 247 10*3/uL (ref 150–400)
RBC: 5.25 MIL/uL (ref 4.22–5.81)
RDW: 12.5 % (ref 11.5–15.5)
WBC: 5.5 10*3/uL (ref 4.0–10.5)
nRBC: 0 % (ref 0.0–0.2)

## 2019-11-05 LAB — BASIC METABOLIC PANEL
Anion gap: 10 (ref 5–15)
BUN: 11 mg/dL (ref 6–20)
CO2: 25 mmol/L (ref 22–32)
Calcium: 9.3 mg/dL (ref 8.9–10.3)
Chloride: 107 mmol/L (ref 98–111)
Creatinine, Ser: 0.75 mg/dL (ref 0.61–1.24)
GFR calc Af Amer: >60 mL/min (ref >60)
GFR calc non Af Amer: >60 mL/min (ref >60)
Glucose, Bld: 117 mg/dL — ABNORMAL HIGH (ref 70–99)
Potassium: 4.3 mmol/L (ref 3.5–5.1)
Sodium: 142 mmol/L (ref 135–145)

## 2019-11-05 MED ORDER — SODIUM CHLORIDE 0.9% FLUSH: 3.0000 mL | Freq: Once | INTRAVENOUS | Status: DC

## 2019-11-05 NOTE — Progress Notes (Signed)
Lower extremity venous has been completed.   Preliminary results in CV Proc.   [name] 11/05/2019 7:34 PM

## 2019-11-05 NOTE — ED Notes (Signed)
Patient  has a extra gold top in the main lab

## 2019-11-05 NOTE — Telephone Encounter (Signed)
Patient called requesting to speak with his PCP. Patient did not want to disclose any information and want to wait to speak with his PCP. Please f/u with patient.

## 2019-11-05 NOTE — Discharge Instructions (Signed)
Please schedule a follow-up appointment with your primary doctor regarding the symptoms you are experiencing today.  Recommend following up with your oncologist as well.  Return to ER if you have any further episodes of passing out, lightheadedness, fever, chest pain difficulty breathing or other new concerning symptom.

## 2019-11-05 NOTE — ED Triage Notes (Addendum)
Pt states he had a brain tumor removed in June. Pt states that lately he has been having near syncopal instances, the most recent one today. Pt states that his head feels tight and is having pain behind his eyes.  Pt also adds that he has MRI scheduled for June, but did not want to wait. Pt did have CT done on 4/17, which did not show any abnormalities.

## 2019-11-05 NOTE — ED Provider Notes (Signed)
[facility] DEPT Provider Note   CSN: [id number] Arrival date & time: 11/05/19  1456     History Chief Complaint  Patient presents with  . Headache  . Near Syncope    Joshua [name] is a 59 y.o. male.  Presents to emergency department with chief complaint of headache, near syncopal episode.  States headache is mild today, improves with the ibuprofen.  Not worse headache of his life, not sudden onset.  Does not want any additional medicine at this time for pain.  Reports today while watching TV he suddenly felt lightheaded, felt like he was going to pass out.  Did not have complete loss consciousness.  No seizure activity, no bladder or bowel incontinence.  Reports this was similar to an episode he experienced a few days ago when he came to the emergency room.  Review chart, reviewed recent ER notes.  Past medical history of recurrent meningioma s/p resection in 2020 and 2014.  History of seizures Dilantin and lamotrigine.  HPI     Past Medical History:  Diagnosis Date  . Brain tumor ([place]) 02/20/2013   brain tumor removed in March 2014, Dr Donald Pore  . Dizziness   . Enlarged prostate   . Headache(784.0)    scattered  . Meningioma ([place])   . Seizures [facility])     Patient Active Problem List   Diagnosis Date Noted  . 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[place]) 03/07/2019  . Seizures ([place]) 01/31/2019  . Meningioma, recurrent of brain ([place]) 01/31/2019  . Meningioma ([place]) 01/03/2019  . Brain tumor ([place]) 12/27/2018  . Diffuse idiopathic skeletal hyperostosis 06/05/2013    Past Surgical History:  Procedure Laterality Date  . APPLICATION OF CRANIAL NAVIGATION N/A 01/10/2019   Procedure: APPLICATION OF CRANIAL NAVIGATION;  Surgeon: Erline Levine, MD;  Location: [facility];  Service: Neurosurgery;  Laterality: N/A;  . CATARACT EXTRACTION Right   . CRANIOTOMY Right 11/06/2012   Procedure: CRANIOTOMY TUMOR EXCISION;  Surgeon: Erline Levine, MD;  Location:  [facility] NEUR[facility] ORS;  Service: Neurosurgery;  Laterality: Right;  Right Parasagittal craniotomy for meningioma with Stealth  . CRANIOTOMY Right 01/10/2019   Procedure: Right Parasagittal Craniotomy for Tumor;  Surgeon: Erline Levine, MD;  Location: [facility];  Service: Neurosurgery;  Laterality: Right;  Right parasagittal craniotomy for tumor  . ESOPHAGOGASTRODUODENOSCOPY    . right knee arthroscopy         Family History  Problem Relation Age of Onset  . Hypertension Mother   . Hypertension Father     Social History   Tobacco Use  . Smoking status: Former Smoker    Types: Cigarettes    Quit date: 01/29/2019    Years since quitting: 0.7  . Smokeless tobacco: Never Used  . Tobacco comment: hasn't smoked in a week  Substance Use Topics  . Alcohol use: Yes    Alcohol/week: 8.0 standard drinks    Types: 6 Cans of beer, 2 Shots of liquor per week    Comment: occasionally  . Drug use: No    Home Medications Prior to Admission medications   Medication Sig Start Date End Date Taking? Authorizing Provider  aspirin EC 81 MG EC tablet Take 1 tablet (81 mg total) by mouth daily. 01/14/19   Erline Levine, MD  clonazePAM (KLONOPIN) 0.5 MG tablet Take 1 tablet (0.5 mg total) by mouth 2 (two) times daily. 10/14/19   [name], MD  cyclobenzaprine (FLEXERIL) 10 MG tablet Take 1 tablet (10 mg total)  by mouth 2 (two) times daily as needed for muscle spasms. 10/24/19   Charlott Rakes, MD  FIBER ADULT GUMMIES PO Take 1 tablet by mouth daily.    [provider]  furosemide (LASIX) 20 MG tablet Take 1 tablet (20 mg total) by mouth daily. Prn swelling.  Use sparingly Patient not taking: Reported on 10/24/2019 08/22/19   Argentina Donovan, PA-C  ibuprofen (ADVIL) 200 MG tablet Take 400 mg by mouth daily as needed for moderate pain.    [provider]  lamoTRIgine (LAMICTAL) 100 MG tablet Take 2 tablets (200 mg total) by mouth 2 (two) times daily. 10/31/19   Vaslow, Acey Lav, MD  LORazepam  (ATIVAN) 1 MG tablet Take 1 tablet (1 mg total) by mouth every 8 (eight) hours as needed for seizure (for breakthrough seizures). 10/14/19   [name], MD  meloxicam (MOBIC) 7.5 MG tablet Take 1 tablet (7.5 mg total) by mouth daily. 10/24/19   Charlott Rakes, MD  Miconazole Nitrate 2 % AERP Apply to affected area BID as needed Patient not taking: Reported on 11/02/2019 10/15/19   Chase Picket, MD  mirtazapine (REMERON) 15 MG tablet Take 1 tablet (15 mg total) by mouth at bedtime. Patient not taking: Reported on 10/24/2019 10/11/19   [name], MD  ondansetron (ZOFRAN-ODT) 4 MG disintegrating tablet Take 1 tablet (4 mg total) by mouth every 8 (eight) hours as needed for nausea or vomiting. 10/17/19   [name], MD  phenytoin (DILANTIN) 200 MG ER capsule Take 1 capsule (200 mg total) by mouth 2 (two) times daily. 10/22/19   Vaslow, Acey Lav, MD  sucralfate (CARAFATE) 1 g tablet Take 1 tablet (1 g total) by mouth 4 (four) times daily -  with meals and at bedtime for 14 days. Patient not taking: Reported on 11/02/2019 07/23/19 10/02/19  Charlott Rakes, MD  vitamin C (ASCORBIC ACID) 500 MG tablet Take 500 mg by mouth daily.    [provider]  cetirizine (ZYRTEC) 10 MG tablet Take 1 tablet (10 mg total) by mouth daily. 08/08/19 09/08/19  Argentina Donovan, PA-C  prochlorperazine (COMPAZINE) 10 MG tablet Take 1 tablet (10 mg total) by mouth every 6 (six) hours as needed for nausea or vomiting. Patient not taking: Reported on 09/19/2019 03/08/19 10/15/19  Kyung Rudd, MD    Allergies    Patient has no known allergies.  Review of Systems   Review of Systems  Constitutional: Negative for chills and fever.  HENT: Negative for ear pain and sore throat.   Eyes: Negative for pain and visual disturbance.  Respiratory: Negative for cough and shortness of breath.   Cardiovascular: Negative for chest pain and palpitations.  Gastrointestinal: Negative for abdominal pain and vomiting.    Genitourinary: Negative for dysuria and hematuria.  Musculoskeletal: Negative for arthralgias and back pain.  Skin: Negative for color change and rash.  Neurological: Positive for syncope, light-headedness and headaches. Negative for seizures.  All other systems reviewed and are negative.   Physical Exam Updated Vital Signs BP (!) 154/95 (BP Location: Left Arm)   Pulse 87   Temp 98.3 F (36.8 C) (Oral)   Resp 18   SpO2 98%   Physical Exam Vitals and nursing note reviewed.  Constitutional:      Appearance: He is well-developed.  HENT:     Head: Normocephalic and atraumatic.  Eyes:     Conjunctiva/sclera: Conjunctivae normal.  Cardiovascular:     Rate and Rhythm: Normal rate  and regular rhythm.     Heart sounds: No murmur.  Pulmonary:     Effort: Pulmonary effort is normal. No respiratory distress.     Breath sounds: Normal breath sounds.  Abdominal:     Palpations: Abdomen is soft.     Tenderness: There is no abdominal tenderness.  Musculoskeletal:     Cervical back: Neck supple.  Skin:    General: Skin is warm and dry.  Neurological:     Mental Status: He is alert and oriented to person, place, and time. Mental status is at baseline.  Psychiatric:     Comments: Somewhat anxious     ED Results / Procedures / Treatments   Labs (all labs ordered are listed, but only abnormal results are displayed) Labs Reviewed  BASIC METABOLIC PANEL - Abnormal; Notable for the following components:      Result Value   Glucose, Bld 117 (*)    All other components within normal limits  CBC  URINALYSIS, ROUTINE W REFLEX MICROSCOPIC  PHENYTOIN LEVEL, TOTAL  LAMOTRIGINE LEVEL  CBG MONITORING, ED    EKG None  Radiology MR BRAIN WO CONTRAST  Result Date: 11/05/2019 CLINICAL DATA:  Headache. History of meningioma resection. EXAM: MRI HEAD WITHOUT CONTRAST TECHNIQUE: Multiplanar, multiecho pulse sequences of the brain and surrounding structures were obtained without intravenous  contrast. COMPARISON:  Brain MRI 08/30/2019 FINDINGS: Brain: Areas of gliosis within the brain parenchyma at the vertex are unchanged. Multifocal white matter hyperintensity, most commonly due to chronic ischemic microangiopathy. normal volume of CSF spaces. There are areas of hemosiderin deposition, predominantly at the vertex. Vascular: The signal within superior sagittal sinus remains abnormal, but unchanged. The major arterial flow voids are normal with skull base. Skull and upper cervical spine: Prior vertex craniotomy. Sinuses/Orbits: Negative. Other: None. IMPRESSION: Unchanged postoperative appearance of the brain with gliosis at the right greater than left vertex. 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 No intravenous contrast agent was administered which limits assessment for residual or recurrent tumor. Electronically Signed   By: Ulyses Jarred M.D.   On: 11/05/2019 18:58    Procedures Procedures (including critical care time)  Medications Ordered in ED Medications  sodium chloride flush (NS) 0.9 % injection 3 mL (has no administration in time range)    ED Course  I have reviewed the triage vital signs and the nursing notes.  Pertinent labs & imaging results that were available during my care of the patient were reviewed by me and considered in my medical decision making (see chart for details).    MDM Rules/Calculators/A&P                      59 year old male presented to ER with near syncopal episode.  In ER, patient was well-appearing with no ongoing symptoms except for a mild headache.  Patient's chief concern was recurrence of his meningioma.  MRI brain was obtained and this was negative though somewhat limited due to lack of contrast.  EKG without ischemic changes or arrhythmia.  Basic labs were grossly within normal limits.  No further episodes.  Given work-up ER, believe patient can be discharged and managed in the outpatient setting.  Recommended that he  call his primary doctor to schedule follow-up appointment and discuss potential need for additional testing given his near syncopal episode today (I.e. holter monitor, echo).  After the discussed management above, the patient was determined to be safe for discharge.  The patient was in agreement with  this plan and all questions regarding their care were answered.  ED return precautions were discussed and the patient will return to the ED with any significant worsening of condition.   Final Clinical Impression(s) / ED Diagnoses Final diagnoses:  Near syncope    Rx / DC Orders ED Discharge Orders    None       Lucrezia Starch, MD 11/06/19 1816

## 2019-11-06 NOTE — Telephone Encounter (Signed)
ED notes are not complete hence I am unable to see the plan.  Please schedule with any available clinician to follow-up on this.

## 2019-11-06 NOTE — Telephone Encounter (Signed)
States that he has went to the ED and they informed him that he may need a stress test due to him fainting.

## 2019-11-07 ENCOUNTER — Ambulatory Visit: Payer: Medicaid Other

## 2019-11-08 NOTE — Telephone Encounter (Signed)
Patient was informed that he has an appointment on 11/26/2019 to discuss matter.

## 2019-11-12 ENCOUNTER — Inpatient Hospital Stay: Payer: Medicaid Other | Admitting: *Deleted

## 2019-11-12 ENCOUNTER — Other Ambulatory Visit: Payer: Self-pay

## 2019-11-12 ENCOUNTER — Ambulatory Visit: Payer: Medicaid Other

## 2019-11-12 DIAGNOSIS — M25651 Stiffness of right hip, not elsewhere classified: Secondary | ICD-10-CM | POA: Diagnosis not present

## 2019-11-12 DIAGNOSIS — R2689 Other abnormalities of gait and mobility: Secondary | ICD-10-CM | POA: Diagnosis not present

## 2019-11-12 DIAGNOSIS — D329 Benign neoplasm of meninges, unspecified: Secondary | ICD-10-CM

## 2019-11-12 DIAGNOSIS — M6281 Muscle weakness (generalized): Secondary | ICD-10-CM

## 2019-11-12 DIAGNOSIS — M25551 Pain in right hip: Secondary | ICD-10-CM | POA: Diagnosis not present

## 2019-11-12 NOTE — Therapy (Signed)
[facility] [address] [place], [place], [address] Phone: [phone]   Fax:  [phone]  Physical Therapy Treatment  Patient Details  Name: Joshua [name] MRN: [id number] Date of Birth: 1960/08/29 Referring Provider (PT): Cecil Cobbs   Encounter Date: 11/12/2019  PT End of Session - 11/12/19 1622    Visit Number  11    Number of Visits  28    Date for PT Re-Evaluation  01/30/20   POC for 9 weeks, Cert for 90 days   Authorization Type  Medicaid 3 visits 06/03/11/02/2019, 12/10-12/30/20 6 units, 1/7-1/27/2021 3 visits, 1/29-3/4 10 visits, 3 visits 3/8-3/28, 3 visits 4/16-5/6    Authorization - Visit Number  2    Authorization - Number of Visits  3    PT Start Time  1620    PT Stop Time  1700    PT Time Calculation (min)  40 min    Equipment Utilized During Treatment  Gait belt    Activity Tolerance  Patient tolerated treatment well;No increased pain    Behavior During Therapy  WFL for tasks assessed/performed       Past Medical History:  Diagnosis Date  . Brain tumor ([facility]) 02/20/2013   brain tumor removed in March 2014, Dr Donald Pore  . Dizziness   . Enlarged prostate   . Headache(784.0)    scattered  . Meningioma ([place])   . Seizures ([place])     Past Surgical History:  Procedure Laterality Date  . APPLICATION OF CRANIAL NAVIGATION N/A 01/10/2019   Procedure: APPLICATION OF CRANIAL NAVIGATION;  Surgeon: Erline Levine, MD;  Location: [facility];  Service: Neurosurgery;  Laterality: N/A;  . CATARACT EXTRACTION Right   . CRANIOTOMY Right 11/06/2012   Procedure: CRANIOTOMY TUMOR EXCISION;  Surgeon: Erline Levine, MD;  Location: [facility];  Service: Neurosurgery;  Laterality: Right;  Right Parasagittal craniotomy for meningioma with Stealth  . CRANIOTOMY Right 01/10/2019   Procedure: Right Parasagittal Craniotomy for Tumor;  Surgeon: Erline Levine, MD;  Location: [facility];  Service: Neurosurgery;  Laterality: Right;   Right parasagittal craniotomy for tumor  . ESOPHAGOGASTRODUODENOSCOPY    . right knee arthroscopy      There were no vitals filed for this visit.  Subjective Assessment - 11/12/19 1624    Subjective  Pt reports that he is doing ok. He was in ED 2 times the last week due to near syncopal episodes. One was dehydration for which he received fluids. They did MRI the 2nd time and did not find any changes.    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 History of right THA on 2019.    Patient Stated Goals  Pt would like to be able to elimate his cane use.    Currently in Pain?  No/denies    Pain Onset  More than a month ago                       [facility] Adult PT Treatment/Exercise - 11/12/19 1627      Ambulation/Gait   Ambulation/Gait  Yes    Ambulation/Gait Assistance  6: Modified independent (Device/Increase time);5: Supervision    Ambulation/Gait Assistance Details  mod I on level and supervision over grass with verbal cues to increase left foot clearance with more heel strike.    Ambulation Distance (Feet)  800 Feet    Assistive device  Straight cane    Gait Pattern  Step-through pattern;Decreased hip/knee flexion - left;Decreased dorsiflexion - left    Ambulation Surface  Level;Unlevel;Outdoor;Paved;Grass      Neuro Re-ed    Neuro Re-ed Details   Gait in gym with posterior resistance x 230' with work on more toe off with steps. Gait around in gym 115' after working on carryover with larger steps from more push off. Pt had some tone kicking in with left swing through at times but overall better left step length.       Exercises   Exercises  Other Exercises    Other Exercises   Standing at bottom of steps left hip flexion up to 2nd step then extending back for left hip extension prior to stepping back up for more push off from toes.  Performed x 10. Standing on bottom step on forefoot lowering down for eccentric PF then raising up on toes. Limited PF above step. Performed x 10.             PT Education - 11/12/19 2010    Education Details  Discussed plan to decrease frequency starting next week to 1x/week to be able to spread out visits more and not use up all Medicaid visits.    Person(s) Educated  Patient    Methods  Explanation    Comprehension  Verbalized understanding       PT Short Term Goals - 11/01/19 0915      PT SHORT TERM GOAL #1   Title  Pt will be independent with HEP to continue strength and balance gains on own.    Baseline  HEP needed to reviewed/modified    Time  4    Period  Weeks    Status  New    Target Date  11/29/19      PT SHORT TERM GOAL #2   Title  Pt will reduce TUG to <10 seconds w/ AD to demonstrate improved ambulation and further reduce risk for falls.    Baseline  11.81 seconds w/o AD    Time  4    Period  Weeks    Status  New    Target Date  11/29/19        PT Long Term Goals - 11/01/19 0917      PT LONG TERM GOAL #1   Title  Pt will increase FGA from 21/30 to >23/30 for improved balance and gait safety.    Baseline  21/30 on 09/13/19, 18/30 on 4/16    Time  9    Period  Weeks    Status  New    Target Date  01/03/20      PT LONG TERM GOAL #2   Title  Pt will ambulate >700' on varied surfaces independently without AD for improved community mobility.    Baseline  Ambulated 500' without AD today, intermittent CGA. Currently using SPC inside/outside of home.    Time  9    Period  Weeks    Status  New    Target Date  01/03/20      PT LONG TERM GOAL #3   Title  Pt will be independent with progressive HEP to continue strength and balance gains on own.    Baseline  HEP continued to be reassesed    Time  9    Period  Weeks    Status  New    Target Date  01/03/20  Plan - 11/12/19 2011    Clinical Impression Statement  PT focused on gait on  varied surfaces as well as trying to get more toe off for better LLE advancement. Pt did some improvement after practice but tone kicking in at times.    Examination-Activity Limitations  Locomotion Level;Stairs;Dressing    Examination-Participation Restrictions  Community Activity    Stability/Clinical Decision Making  Evolving/Moderate complexity    Rehab Potential  Good    PT Frequency  2x / week    PT Duration  Other (comment)   9 Weeks   PT Treatment/Interventions  ADLs/Self Care Home Management;DME Instruction;Gait training;Stair training;Functional mobility training;Therapeutic activities;Patient/family education;Neuromuscular re-education;Balance training;Therapeutic exercise;Orthotic Fit/Training;Manual techniques;Passive range of motion;Aquatic Therapy    PT Next Visit Plan  Check STGs and submit for medicaid extension 1x/week for 4 weeks. Decreasing frequency to save some visits. Continue gait training working on improved toe off and left hip flexion.    Consulted and Agree with Plan of Care  Patient       Patient will benefit from skilled therapeutic intervention in order to improve the following deficits and impairments:  Abnormal gait, Decreased activity tolerance, Decreased balance, Difficulty walking, Decreased strength, Impaired sensation, Postural dysfunction, Impaired flexibility, Decreased mobility, Decreased range of motion  Visit Diagnosis: Other abnormalities of gait and mobility  Muscle weakness (generalized)     Problem List Patient Active Problem List   Diagnosis Date Noted  . 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[place]) 03/07/2019  . Seizures ([place]) 01/31/2019  . Meningioma, recurrent of brain ([place]) 01/31/2019  . Meningioma ([place]) 01/03/2019  . Brain tumor ([place]) 12/27/2018  . Diffuse idiopathic skeletal hyperostosis 06/05/2013    Electa Sniff, PT, DPT, NCS 11/12/2019, 8:16 PM  [facility] [address] [place], [place], [address] Phone: [phone]   Fax:  [phone]  Name: Joshua [name] MRN: [id number] Date of Birth: 1960-12-18

## 2019-11-12 NOTE — Progress Notes (Signed)
[name]  Clinical Social Work was referred by neuro-oncology for assessment of psychosocial needs.  Clinical Social Worker met with patient in [place] office  to offer support and assess for needs.  Patient shared triggers causing anxiety including his health and also living situation.  CSW explored skills for coping with situation and patient's long term goals.  Mr. Joshua [name] requested assistance searching for new housing.  CSW provided list of affordable senior/disability housing- CSW contacted several locations with patient and requested patient continue to make inquiries.  CSW will make referral to [employer].  Patient shared he has attempted different deep breathing techniques, but they are not useful when triggered in the moment.  CSW encouraged patient to utilize [employer] classes aimed at reducing stress, insomnia, and anxiety.  CSW also discussed individual counseling to learn ways to manage emotions/behavior when triggered (by others).   Gwinda Maine, LCSW  Clinical Social Worker [facility]

## 2019-11-13 ENCOUNTER — Inpatient Hospital Stay: Payer: Medicaid Other | Admitting: *Deleted

## 2019-11-14 ENCOUNTER — Ambulatory Visit: Payer: Medicaid Other

## 2019-11-18 ENCOUNTER — Other Ambulatory Visit: Payer: Self-pay | Admitting: Internal Medicine

## 2019-11-18 ENCOUNTER — Ambulatory Visit: Payer: Medicaid Other | Attending: Internal Medicine

## 2019-11-18 ENCOUNTER — Other Ambulatory Visit: Payer: Self-pay

## 2019-11-18 VITALS — BP 130/87 | HR 78

## 2019-11-18 DIAGNOSIS — R2689 Other abnormalities of gait and mobility: Secondary | ICD-10-CM | POA: Insufficient documentation

## 2019-11-18 DIAGNOSIS — M25651 Stiffness of right hip, not elsewhere classified: Secondary | ICD-10-CM | POA: Insufficient documentation

## 2019-11-18 DIAGNOSIS — M6281 Muscle weakness (generalized): Secondary | ICD-10-CM | POA: Insufficient documentation

## 2019-11-18 DIAGNOSIS — M25551 Pain in right hip: Secondary | ICD-10-CM | POA: Diagnosis not present

## 2019-11-18 NOTE — Therapy (Signed)
[name] [address] [place], [place], [address] Phone: [phone]   Fax:  [phone]  Physical Therapy Treatment  Patient Details  Name: Joshua [name] MRN: [id number] Date of Birth: 1961/05/01 Referring Provider (PT): Cecil Cobbs   Encounter Date: 11/18/2019  PT End of Session - 11/18/19 0722    Visit Number  12    Number of Visits  28    Date for PT Re-Evaluation  01/30/20   POC for 9 weeks, Cert for 90 days   Authorization Type  Medicaid 3 visits 06/03/11/02/2019, 12/10-12/30/20 6 units, 1/7-1/27/2021 3 visits, 1/29-3/4 10 visits, 3 visits 3/8-3/28, 3 visits 4/16-5/6    Authorization - Visit Number  3    Authorization - Number of Visits  3    PT Start Time  0715    PT Stop Time  0801    PT Time Calculation (min)  46 min    Equipment Utilized During Treatment  Gait belt    Activity Tolerance  Patient tolerated treatment well;No increased pain    Behavior During Therapy  WFL for tasks assessed/performed       Past Medical History:  Diagnosis Date  . Brain tumor ([place]) 02/20/2013   brain tumor removed in March 2014, Dr Donald Pore  . Dizziness   . Enlarged prostate   . Headache(784.0)    scattered  . Meningioma ([place])   . Seizures ([place])     Past Surgical History:  Procedure Laterality Date  . APPLICATION OF CRANIAL NAVIGATION N/A 01/10/2019   Procedure: APPLICATION OF CRANIAL NAVIGATION;  Surgeon: Erline Levine, MD;  Location: [facility];  Service: Neurosurgery;  Laterality: N/A;  . CATARACT EXTRACTION Right   . CRANIOTOMY Right 11/06/2012   Procedure: CRANIOTOMY TUMOR EXCISION;  Surgeon: Erline Levine, MD;  Location: [facility];  Service: Neurosurgery;  Laterality: Right;  Right Parasagittal craniotomy for meningioma with Stealth  . CRANIOTOMY Right 01/10/2019   Procedure: Right Parasagittal Craniotomy for Tumor;  Surgeon: Erline Levine, MD;  Location: [facility];  Service: Neurosurgery;  Laterality: Right;  Right  parasagittal craniotomy for tumor  . ESOPHAGOGASTRODUODENOSCOPY    . right knee arthroscopy      Vitals:   11/18/19 0800  BP: 130/87  Pulse: 78    Subjective Assessment - 11/18/19 0718    Subjective  Patient reports that he had seizure two days ago but is feeling fine today. patient reports that he did not take medications due to making him sleepy, encouraged and educated to talk to PCP about potential medication management. Reporting that he would like to stretch in today's session due to tightness.    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 History of right THA on 2019.    Patient Stated Goals  Pt would like to be able to elimate his Joshua use.    Currently in Pain?  Yes    Pain Score  4     Pain Location  Shoulder    Pain Orientation  Left    Pain Descriptors / Indicators  Aching    Pain Type  Acute pain    Pain Onset  More than a month ago    Aggravating Factors   alot of movement    Pain Relieving Factors  rest  [facility] Adult PT Treatment/Exercise - 11/18/19 1010      Transfers   Transfers  Sit to Stand;Stand to Sit    Sit to Stand  7: Independent    Stand to Sit  7: Independent    Comments  x 5 reps throughout session.       Ambulation/Gait   Ambulation/Gait  Yes    Ambulation/Gait Assistance  6: Modified independent (Device/Increase time);5: Supervision    Ambulation/Gait Assistance Details  completing gait training without AD, patient had one instance of catching toe and requiring Min Guard from PT to avoid LOB. verbal cues for heel to toe pattern and improved step length.     Ambulation Distance (Feet)  350 Feet    Assistive device  None    Gait Pattern  Step-through pattern;Decreased hip/knee flexion - left;Decreased dorsiflexion - left    Ambulation Surface  Level;Indoor      Exercises    Exercises  Knee/Hip      Knee/Hip Exercises: Stretches   Passive Hamstring Stretch  3 reps;30 seconds;Both    Passive Hamstring Stretch Limitations  verbal cues to hip hinge and avoid flexion of spine to promote improved stretch    Gastroc Stretch  Both;3 reps;30 seconds    Gastroc Stretch Limitations  standing at counter top; verbal cues and tactile cues to ensure knee extension on posterior leg    Other Knee/Hip Stretches  completed single knee to chest stretch, manually by PT to reduce stiffness/tightness in BLE, 3 x 30 seconds each.       Knee/Hip Exercises: Supine   Other Supine Knee/Hip Exercises  Completed trunk rotations to R/L with BLE on therapy ball. verbal cues for improved control with completion. at end range encouraged 5 sec hold for further stretch.              PT Education - 11/18/19 1126    Education Details  Updated HEP (see patient instructions; new exercises highlighted)    Person(s) Educated  Patient    Methods  Demonstration;Explanation;Handout    Comprehension  Verbalized understanding       PT Short Term Goals - 11/18/19 0723      PT SHORT TERM GOAL #1   Title  Pt will be independent with HEP to continue strength and balance gains on own.    Baseline  cotinue to progress HEP at this time    Time  4    Period  Weeks    Status  On-going    Target Date  11/29/19      PT SHORT TERM GOAL #2   Title  Pt will reduce TUG to <10 seconds w/ AD to demonstrate improved ambulation and further reduce risk for falls.    Baseline  11.81 seconds w/o AD, 10.92 seconds w/ AD    Time  4    Period  Weeks    Status  On-going    Target Date  11/29/19        PT Long Term Goals - 11/01/19 0917      PT LONG TERM GOAL #1   Title  Pt will increase FGA from 21/30 to >23/30 for improved balance and gait safety.    Baseline  21/30 on 09/13/19, 18/30 on 4/16    Time  9    Period  Weeks    Status  New    Target Date  01/03/20      PT LONG TERM GOAL #2   Title  Pt  will  ambulate >700' on varied surfaces independently without AD for improved community mobility.    Baseline  Ambulated 500' without AD today, intermittent CGA. Currently using SPC inside/outside of home.    Time  9    Period  Weeks    Status  New    Target Date  01/03/20      PT LONG TERM GOAL #3   Title  Pt will be independent with progressive HEP to continue strength and balance gains on own.    Baseline  HEP continued to be reassesed    Time  9    Period  Weeks    Status  New    Target Date  01/03/20            Plan - 11/18/19 1127    Clinical Impression Statement  Today's skilled PT session focused assessment of STG's. Patient is demonstrating good progress with therapy. Patient was able to completed the TUG test w/ SPC in 10.92 seconds demonstrating progress toward STG/LTG. Patient will continue to beneft from skilled PT services to further progress and meet all unmet STG/LTGs.    Examination-Activity Limitations  Locomotion Level;Stairs;Dressing    Examination-Participation Restrictions  Community Activity    Stability/Clinical Decision Making  Evolving/Moderate complexity    Rehab Potential  Good    PT Frequency  2x / week    PT Duration  Other (comment)   9 Weeks   PT Treatment/Interventions  ADLs/Self Care Home Management;DME Instruction;Gait training;Stair training;Functional mobility training;Therapeutic activities;Patient/family education;Neuromuscular re-education;Balance training;Therapeutic exercise;Orthotic Fit/Training;Manual techniques;Passive range of motion;Aquatic Therapy    PT Next Visit Plan  Continue gait training working on improved toe off and left hip flexion. Strengthening and Stretching exercises    Consulted and Agree with Plan of Care  Patient       Patient will benefit from skilled therapeutic intervention in order to improve the following deficits and impairments:  Abnormal gait, Decreased activity tolerance, Decreased balance, Difficulty  walking, Decreased strength, Impaired sensation, Postural dysfunction, Impaired flexibility, Decreased mobility, Decreased range of motion  Visit Diagnosis: Other abnormalities of gait and mobility  Muscle weakness (generalized)  Pain in right hip  Stiffness of right hip, not elsewhere classified     Problem List Patient Active Problem List   Diagnosis Date Noted  . 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[place]) 03/07/2019  . Seizures ([place]) 01/31/2019  . Meningioma, recurrent of brain ([place]) 01/31/2019  . Meningioma ([place]) 01/03/2019  . Brain tumor ([place]) 12/27/2018  . Diffuse idiopathic skeletal hyperostosis 06/05/2013    Jones Bales, PT, DPT 11/18/2019, 11:31 AM  [facility] [address] [place], [place], [address] Phone: [phone]   Fax:  [phone]  Name: Joshua [name] MRN: [id number] Date of Birth: Aug 15, 1960

## 2019-11-18 NOTE — Patient Instructions (Signed)
Access Code: [email or web address] URL: [email or web address] Date: 11/18/2019 Prepared by: Baldomero Lamy  Exercises Tandem Stance - 2 x daily - 7 x weekly - 3 reps - 1 sets - 30 sec hold Walking March - 1 x daily - 7 x weekly - 3 reps - 1 sets Tandem Walking with Counter Support - 2 x daily - 7 x weekly - 3 reps - 1 sets Side Stepping with Counter Support - 2 x daily - 7 x weekly - 3 reps - 1 sets Romberg Stance on Foam Pad - 2 x daily - 7 x weekly - 3 reps - 1 sets - 30 sec hold Romberg Stance on Foam Pad with Head Rotation - 2 x daily - 7 x weekly - 10 reps - 1 sets Standing Balance with Eyes Closed on Foam - 2 x daily - 7 x weekly - 3 reps - 1 sets - 30 sec hold Mini Squat with Counter Support - 2 x daily - 7 x weekly - 10 reps - 2 sets Active assist ankle DF then resisted PF - 2 x daily - 7 x weekly - 10 reps - 3 sets Ankle Inversion Eversion Towel Slide - 1 x daily - 7 x weekly - 10 reps - 3 sets Standing Hip Extension with Ankle Weight - 1 x daily - 5 x weekly - 10 reps - 2 sets Standing Alternating Knee Flexion with Ankle Weights - 1 x daily - 5 x weekly - 10 reps - 2 sets Heel rises with counter support - 2 x daily - 7 x weekly - 10 reps - 3 sets Toe Walking with Counter Support - 2 x daily - 7 x weekly - 3-4 reps - 1 sets Seated Hamstring Stretch - 1 x daily - 7 x weekly - 1 sets - 3 reps - 30 hold Standing Gastroc Stretch at Counter - 1 x daily - 7 x weekly - 1 sets - 3 reps - 30 hold

## 2019-11-18 NOTE — Telephone Encounter (Signed)
Rx

## 2019-11-19 ENCOUNTER — Telehealth: Payer: Self-pay | Admitting: Family Medicine

## 2019-11-19 DIAGNOSIS — D320 Benign neoplasm of cerebral meninges: Secondary | ICD-10-CM

## 2019-11-19 DIAGNOSIS — R569 Unspecified convulsions: Secondary | ICD-10-CM

## 2019-11-19 NOTE — Telephone Encounter (Signed)
He has been seeing Dr. Rob Hickman for his tumor removal and states that he would like a second opinion.  He is requesting a Garment/textile technologist at [facility]. [phone] number

## 2019-11-19 NOTE — Telephone Encounter (Signed)
Patient called requesting for a referral for Neurology because he needs a second opinion.

## 2019-11-20 ENCOUNTER — Telehealth: Payer: Self-pay

## 2019-11-20 NOTE — Telephone Encounter (Signed)
Pt left message requesting a return call back on triage line. I left him a VM to call us back if he still needs [place].

## 2019-11-20 NOTE — Telephone Encounter (Signed)
Would he like to see a neurologist or a neurosurgeon?

## 2019-11-21 NOTE — Telephone Encounter (Signed)
Neurologist

## 2019-11-22 ENCOUNTER — Ambulatory Visit: Payer: Medicaid Other

## 2019-11-22 ENCOUNTER — Other Ambulatory Visit: Payer: Self-pay

## 2019-11-22 DIAGNOSIS — M6281 Muscle weakness (generalized): Secondary | ICD-10-CM | POA: Diagnosis not present

## 2019-11-22 DIAGNOSIS — M25651 Stiffness of right hip, not elsewhere classified: Secondary | ICD-10-CM | POA: Diagnosis not present

## 2019-11-22 DIAGNOSIS — R2689 Other abnormalities of gait and mobility: Secondary | ICD-10-CM | POA: Diagnosis not present

## 2019-11-22 DIAGNOSIS — M25551 Pain in right hip: Secondary | ICD-10-CM | POA: Diagnosis not present

## 2019-11-22 NOTE — Therapy (Signed)
[facility] [address] [place], [place], [address] Phone: [phone]   Fax:  [phone]  Physical Therapy Treatment  Patient Details  Name: Joshua [name] MRN: [id number] Date of Birth: 07/08/1961 Referring Provider (PT): Cecil Cobbs   Encounter Date: 11/22/2019  PT End of Session - 11/22/19 0721    Visit Number  13    Number of Visits  28    Date for PT Re-Evaluation  01/30/20   POC for 9 weeks, Cert for 90 days   Authorization Type  Medicaid 3 visits 06/03/11/02/2019, 12/10-12/30/20 6 units, 1/7-1/27/2021 3 visits, 1/29-3/4 10 visits, 3 visits 3/8-3/28, 3 visits 4/16-5/6, 4 visits 5/7-6/3    Authorization - Visit Number  1    Authorization - Number of Visits  4    PT Start Time  0719    PT Stop Time  0800    PT Time Calculation (min)  41 min    Equipment Utilized During Treatment  Gait belt    Activity Tolerance  Patient tolerated treatment well;No increased pain    Behavior During Therapy  WFL for tasks assessed/performed       Past Medical History:  Diagnosis Date  . Brain tumor ([facility]) 02/20/2013   brain tumor removed in March 2014, Dr Donald Pore  . Dizziness   . Enlarged prostate   . Headache(784.0)    scattered  . Meningioma ([facility])   . Seizures ([place])     Past Surgical History:  Procedure Laterality Date  . APPLICATION OF CRANIAL NAVIGATION N/A 01/10/2019   Procedure: APPLICATION OF CRANIAL NAVIGATION;  Surgeon: Erline Levine, MD;  Location: [facility];  Service: Neurosurgery;  Laterality: N/A;  . CATARACT EXTRACTION Right   . CRANIOTOMY Right 11/06/2012   Procedure: CRANIOTOMY TUMOR EXCISION;  Surgeon: Erline Levine, MD;  Location: [facility];  Service: Neurosurgery;  Laterality: Right;  Right Parasagittal craniotomy for meningioma with Stealth  . CRANIOTOMY Right 01/10/2019   Procedure: Right Parasagittal Craniotomy for Tumor;  Surgeon: Erline Levine, MD;  Location: [facility];  Service: Neurosurgery;   Laterality: Right;  Right parasagittal craniotomy for tumor  . ESOPHAGOGASTRODUODENOSCOPY    . right knee arthroscopy      There were no vitals filed for this visit.  Subjective Assessment - 11/22/19 0720    Subjective  Pt reports he has been doing well. No new seizures.    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 History of right THA on 2019.    Patient Stated Goals  Pt would like to be able to elimate his cane use.    Currently in Pain?  Yes    Pain Score  5     Pain Location  Back    Pain Orientation  Lower    Pain Descriptors / Indicators  --   stiff   Pain Onset  More than a month ago                       [facility] Adult PT Treatment/Exercise - 11/22/19 0721      Ambulation/Gait   Ambulation/Gait  Yes    Ambulation/Gait Assistance  6: Modified independent (Device/Increase time);5: Supervision    Ambulation/Gait Assistance Details  Verbal cues for erect posture and larger left step length    Ambulation Distance (Feet)  230 Feet    Assistive device  None    Gait  Pattern  Step-through pattern;Decreased step length - left;Decreased hip/knee flexion - left    Ambulation Surface  Level;Indoor      Neuro Re-ed    Neuro Re-ed Details   In // bar: reciprocal steps over 4 hurdles with fingertip support x 5 laps with verbal cues to keep chest up and be sure to bring leg up and over especially on left. Pt was able to demonstrate more hip extension. Did catch left foot a couple times but improved as went on. Outside bar reciprocal steps over floor ladder CGA/min assist as patient initially up on toes too much. Cued to try to hit more with heels that helped. Performed x 4 laps. Side stepping in floor ladder x 1 lap CGA/min assist with more difficulty going to right with left push off. In // bars with bilateral UE support walking on heels x  8'. Pt had to stick bottom out to get toes up. Side stepping in // bars CGA x 2 laps.      Exercises   Exercises  Other Exercises    Other Exercises   Walking lunge in // bars 8' x 2 with UE support      Knee/Hip Exercises: Stretches   Hip Flexor Stretch  3 reps;30 seconds    Hip Flexor Stretch Limitations  PT performed passive sidelying left hip flexor stretch 30 sec x 3 then had patient perform in modified Thomas position over edge of mat 30 sec x 2 on left. Verbal cues to breath and relax as well as keep back flat    Gastroc Stretch  3 reps;30 seconds    Gastroc Stretch Limitations  Standing with verbal cues to bring hips forward             PT Education - 11/22/19 0922    Education Details  Added hip flexor stretch    Person(s) Educated  Patient    Methods  Explanation;Demonstration;Handout    Comprehension  Verbalized understanding;Returned demonstration       PT Short Term Goals - 11/18/19 0723      PT SHORT TERM GOAL #1   Title  Pt will be independent with HEP to continue strength and balance gains on own.    Baseline  cotinue to progress HEP at this time    Time  4    Period  Weeks    Status  On-going    Target Date  11/29/19      PT SHORT TERM GOAL #2   Title  Pt will reduce TUG to <10 seconds w/ AD to demonstrate improved ambulation and further reduce risk for falls.    Baseline  11.81 seconds w/o AD, 10.92 seconds w/ AD    Time  4    Period  Weeks    Status  On-going    Target Date  11/29/19        PT Long Term Goals - 11/01/19 0917      PT LONG TERM GOAL #1   Title  Pt will increase FGA from 21/30 to >23/30 for improved balance and gait safety.    Baseline  21/30 on 09/13/19, 18/30 on 4/16    Time  9    Period  Weeks    Status  New    Target Date  01/03/20      PT LONG TERM GOAL #2   Title  Pt will ambulate >700' on varied surfaces independently without AD for improved community mobility.    Baseline  Ambulated  500' without AD today,  intermittent CGA. Currently using SPC inside/outside of home.    Time  9    Period  Weeks    Status  New    Target Date  01/03/20      PT LONG TERM GOAL #3   Title  Pt will be independent with progressive HEP to continue strength and balance gains on own.    Baseline  HEP continued to be reassesed    Time  9    Period  Weeks    Status  New    Target Date  01/03/20            Plan - 11/22/19 [id number]    Clinical Impression Statement  PT worked more in left hip flexor stretching today to try to get more hip extension for push off with gait. Pt only able to get hip to about neutral with stretching. Pt was able to improve posture with activities with cuing with less trunk flexion.    Examination-Activity Limitations  Locomotion Level;Stairs;Dressing    Examination-Participation Restrictions  Community Activity    Stability/Clinical Decision Making  Evolving/Moderate complexity    Rehab Potential  Good    PT Frequency  2x / week    PT Duration  Other (comment)   9 Weeks   PT Treatment/Interventions  ADLs/Self Care Home Management;DME Instruction;Gait training;Stair training;Functional mobility training;Therapeutic activities;Patient/family education;Neuromuscular re-education;Balance training;Therapeutic exercise;Orthotic Fit/Training;Manual techniques;Passive range of motion;Aquatic Therapy    PT Next Visit Plan  How is hip flexor stretch going? Continue gait training working on improved toe off and left hip flexion. Strengthening and Stretching exercises    Consulted and Agree with Plan of Care  Patient       Patient will benefit from skilled therapeutic intervention in order to improve the following deficits and impairments:  Abnormal gait, Decreased activity tolerance, Decreased balance, Difficulty walking, Decreased strength, Impaired sensation, Postural dysfunction, Impaired flexibility, Decreased mobility, Decreased range of motion  Visit Diagnosis: Other abnormalities of gait  and mobility  Muscle weakness (generalized)     Problem List Patient Active Problem List   Diagnosis Date Noted  . 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[place]) 03/07/2019  . Seizures ([place]) 01/31/2019  . Meningioma, recurrent of brain ([place]) 01/31/2019  . Meningioma ([place]) 01/03/2019  . Brain tumor ([place]) 12/27/2018  . Diffuse idiopathic skeletal hyperostosis 06/05/2013    Electa Sniff, PT, DPT, NCS 11/22/2019, 9:25 AM  [facility] [address] [place], [place], [address] Phone: [phone]   Fax:  [phone]  Name: Joshua [name] MRN: [id number] Date of Birth: June 30, 1961

## 2019-11-22 NOTE — Addendum Note (Signed)
Addended by: Charlott Rakes on: 11/22/2019 08:44 AM   Modules accepted: Orders

## 2019-11-22 NOTE — Telephone Encounter (Signed)
Referral has been placed. 

## 2019-11-22 NOTE — Patient Instructions (Signed)
Access Code: [email or web address] URL: [email or web address] Date: 11/22/2019 Prepared by: Cherly Anderson  Exercises Tandem Stance - 2 x daily - 7 x weekly - 3 reps - 1 sets - 30 sec hold Walking March - 1 x daily - 7 x weekly - 3 reps - 1 sets Tandem Walking with Counter Support - 2 x daily - 7 x weekly - 3 reps - 1 sets Side Stepping with Counter Support - 2 x daily - 7 x weekly - 3 reps - 1 sets Romberg Stance on Foam Pad - 2 x daily - 7 x weekly - 3 reps - 1 sets - 30 sec hold Romberg Stance on Foam Pad with Head Rotation - 2 x daily - 7 x weekly - 10 reps - 1 sets Standing Balance with Eyes Closed on Foam - 2 x daily - 7 x weekly - 3 reps - 1 sets - 30 sec hold Mini Squat with Counter Support - 2 x daily - 7 x weekly - 10 reps - 2 sets Active assist ankle DF then resisted PF - 2 x daily - 7 x weekly - 10 reps - 3 sets Ankle Inversion Eversion Towel Slide - 1 x daily - 7 x weekly - 10 reps - 3 sets Standing Hip Extension with Ankle Weight - 1 x daily - 5 x weekly - 10 reps - 2 sets Standing Alternating Knee Flexion with Ankle Weights - 1 x daily - 5 x weekly - 10 reps - 2 sets Heel rises with counter support - 2 x daily - 7 x weekly - 10 reps - 3 sets Toe Walking with Counter Support - 2 x daily - 7 x weekly - 3-4 reps - 1 sets Seated Hamstring Stretch - 1 x daily - 7 x weekly - 1 sets - 3 reps - 30 hold Standing Gastroc Stretch at Counter - 1 x daily - 7 x weekly - 1 sets - 3 reps - 30 hold Modified Thomas Stretch - 2 x daily - 7 x weekly - 1 sets - 4 reps - 30 sec-1 min hold

## 2019-11-25 DIAGNOSIS — Z1211 Encounter for screening for malignant neoplasm of colon: Secondary | ICD-10-CM | POA: Diagnosis not present

## 2019-11-25 DIAGNOSIS — K5900 Constipation, unspecified: Secondary | ICD-10-CM | POA: Diagnosis not present

## 2019-11-25 DIAGNOSIS — Z8669 Personal history of other diseases of the nervous system and sense organs: Secondary | ICD-10-CM | POA: Diagnosis not present

## 2019-11-26 ENCOUNTER — Telehealth: Payer: Self-pay | Admitting: Family Medicine

## 2019-11-26 ENCOUNTER — Encounter: Payer: Self-pay | Admitting: Family Medicine

## 2019-11-26 ENCOUNTER — Other Ambulatory Visit: Payer: Self-pay

## 2019-11-26 ENCOUNTER — Other Ambulatory Visit: Payer: Self-pay | Admitting: Internal Medicine

## 2019-11-26 ENCOUNTER — Ambulatory Visit: Payer: Medicaid Other | Attending: Family Medicine | Admitting: Family Medicine

## 2019-11-26 ENCOUNTER — Telehealth: Payer: Self-pay | Admitting: *Deleted

## 2019-11-26 VITALS — BP 146/83 | HR 89 | Ht 69.0 in | Wt 203.8 lb

## 2019-11-26 DIAGNOSIS — Z7982 Long term (current) use of aspirin: Secondary | ICD-10-CM | POA: Insufficient documentation

## 2019-11-26 DIAGNOSIS — N481 Balanitis: Secondary | ICD-10-CM

## 2019-11-26 DIAGNOSIS — Z8249 Family history of ischemic heart disease and other diseases of the circulatory system: Secondary | ICD-10-CM | POA: Diagnosis not present

## 2019-11-26 DIAGNOSIS — Z79899 Other long term (current) drug therapy: Secondary | ICD-10-CM | POA: Diagnosis not present

## 2019-11-26 DIAGNOSIS — R32 Unspecified urinary incontinence: Secondary | ICD-10-CM | POA: Diagnosis present

## 2019-11-26 DIAGNOSIS — R569 Unspecified convulsions: Secondary | ICD-10-CM | POA: Insufficient documentation

## 2019-11-26 DIAGNOSIS — I700 Atherosclerosis of aorta: Secondary | ICD-10-CM | POA: Diagnosis not present

## 2019-11-26 LAB — POCT URINALYSIS DIP (CLINITEK)
Bilirubin, UA: NEGATIVE
Blood, UA: NEGATIVE
Glucose, UA: NEGATIVE mg/dL
Ketones, POC UA: NEGATIVE mg/dL
Leukocytes, UA: NEGATIVE
Nitrite, UA: NEGATIVE
POC PROTEIN,UA: 30 — AB
Spec Grav, UA: 1.025 (ref 1.010–1.025)
Urobilinogen, UA: 0.2 E.U./dL
pH, UA: 6.5 (ref 5.0–8.0)

## 2019-11-26 MED ORDER — ATORVASTATIN CALCIUM 20 MG PO TABS: 20.0000 mg | ORAL_TABLET | Freq: Every day | ORAL | 3 refills | Status: DC

## 2019-11-26 MED ORDER — CLOTRIMAZOLE 1 % EX OINT: 1.0000 | TOPICAL_OINTMENT | Freq: Two times a day (BID) | CUTANEOUS | 1 refills | Status: DC

## 2019-11-26 NOTE — Telephone Encounter (Signed)
Patient called requesting refill of Lorazepam.  Routed to MD to review.

## 2019-11-26 NOTE — Progress Notes (Signed)
Subjective:  Patient ID: Joshua [name], male    DOB: 12/04/1960  Age: 59 y.o. MRN: [id number]  CC: Urinary Incontinence   HPI Joshua [name] is a18 year old right-handed male male with a history of meningioma status post resection in 2014 hospitalized at [facility] from 01/03/2019 through 01/14/2019 for recurrent meningioma status post resection(the last of which was in 12/2018), seizureshere foran acute visit  He complains of urine being retained in his prepuce and his semen gets trapped in it as well since he is not circumcised. This has caused a stinging rash in his penis and symptoms have been on for several years.  He has no hematuria or dysuria He is requesting a stress test as he informs me he sometimes has to take Clonazepam to ease the fluttering in his heart and " does not want to cover anything up". He has no chest pain or dyspnea.  Past Medical History:  Diagnosis Date  . Brain tumor ([place]) 02/20/2013   brain tumor removed in March 2014, Dr Donald Pore  . Dizziness   . Enlarged prostate   . Headache(784.0)    scattered  . Meningioma ([place])   . Seizures ([place])     Past Surgical History:  Procedure Laterality Date  . APPLICATION OF CRANIAL NAVIGATION N/A 01/10/2019   Procedure: APPLICATION OF CRANIAL NAVIGATION;  Surgeon: Erline Levine, MD;  Location: [place];  Service: Neurosurgery;  Laterality: N/A;  . CATARACT EXTRACTION Right   . CRANIOTOMY Right 11/06/2012   Procedure: CRANIOTOMY TUMOR EXCISION;  Surgeon: Erline Levine, MD;  Location: [place] [facility];  Service: Neurosurgery;  Laterality: Right;  Right Parasagittal craniotomy for meningioma with Stealth  . CRANIOTOMY Right 01/10/2019   Procedure: Right Parasagittal Craniotomy for Tumor;  Surgeon: Erline Levine, MD;  Location: [place];  Service: Neurosurgery;  Laterality: Right;  Right parasagittal craniotomy for tumor  . ESOPHAGOGASTRODUODENOSCOPY    . right knee arthroscopy      Family History  Problem Relation  Age of Onset  . Hypertension Mother   . Hypertension Father     No Known Allergies  Outpatient Medications Prior to Visit  Medication Sig Dispense Refill  . aspirin EC 81 MG EC tablet Take 1 tablet (81 mg total) by mouth daily. 30 tablet 3  . clonazePAM (KLONOPIN) 0.5 MG tablet Take 1 tablet (0.5 mg total) by mouth 2 (two) times daily. 60 tablet 1  . cyclobenzaprine (FLEXERIL) 10 MG tablet Take 1 tablet (10 mg total) by mouth 2 (two) times daily as needed for muscle spasms. 60 tablet 3  . FIBER ADULT GUMMIES PO Take 1 tablet by mouth daily.    Marland Kitchen LORazepam (ATIVAN) 1 MG tablet Take 1 tablet (1 mg total) by mouth every 8 (eight) hours as needed for seizure (for breakthrough seizures). 30 tablet 0  . meloxicam (MOBIC) 7.5 MG tablet Take 1 tablet (7.5 mg total) by mouth daily. 30 tablet 1  . ondansetron (ZOFRAN-ODT) 4 MG disintegrating tablet DISSOLVE 1 TABLET(4 MG) ON THE TONGUE EVERY 8 HOURS AS NEEDED FOR NAUSEA OR VOMITING 30 tablet 0  . phenytoin (DILANTIN) 200 MG ER capsule Take 1 capsule (200 mg total) by mouth 2 (two) times daily. 60 capsule 3  . vitamin C (ASCORBIC ACID) 500 MG tablet Take 500 mg by mouth daily.    . furosemide (LASIX) 20 MG tablet Take 1 tablet (20 mg total) by mouth daily. Prn swelling.  Use sparingly (Patient not taking: Reported on 10/24/2019) 30 tablet 1  .  ibuprofen (ADVIL) 200 MG tablet Take 400 mg by mouth daily as needed for moderate pain.    Marland Kitchen lamoTRIgine (LAMICTAL) 100 MG tablet Take 2 tablets (200 mg total) by mouth 2 (two) times daily. (Patient not taking: Reported on 11/26/2019) 120 tablet 2  . Miconazole Nitrate 2 % AERP Apply to affected area BID as needed (Patient not taking: Reported on 11/02/2019) 85 g 0  . mirtazapine (REMERON) 15 MG tablet Take 1 tablet (15 mg total) by mouth at bedtime. (Patient not taking: Reported on 10/24/2019) 30 tablet 3  . sucralfate (CARAFATE) 1 g tablet Take 1 tablet (1 g total) by mouth 4 (four) times daily -  with meals and at  bedtime for 14 days. (Patient not taking: Reported on 11/02/2019) 56 tablet 0   No facility-administered medications prior to visit.     ROS Review of Systems  Constitutional: Negative for activity change and appetite change.  HENT: Negative for sinus pressure and sore throat.   Eyes: Negative for visual disturbance.  Respiratory: Negative for cough, chest tightness and shortness of breath.   Cardiovascular: Negative for chest pain and leg swelling.  Gastrointestinal: Negative for abdominal distention, abdominal pain, constipation and diarrhea.  Endocrine: Negative.   Genitourinary: Negative for dysuria.  Musculoskeletal: Negative for joint swelling and myalgias.  Skin: Negative for rash.  Allergic/Immunologic: Negative.   Neurological: Negative for weakness, light-headedness and numbness.  Psychiatric/Behavioral: Negative for dysphoric mood and suicidal ideas.    Objective:  BP (!) 146/83   Pulse 89   Ht 5\' 9"  (1.753 m)   Wt 203 lb 12.8 oz (92.4 kg)   SpO2 95%   BMI 30.10 kg/m   BP/Weight 11/26/2019 11/18/2019 [id number]  Systolic BP [id number]  Diastolic BP 83 87 87  Wt. (Lbs) 203.8 - -  BMI 30.1 - -      Physical Exam Constitutional:      Appearance: He is well-developed.  Neck:     Vascular: No JVD.  Cardiovascular:     Rate and Rhythm: Normal rate.     Heart sounds: Normal heart sounds. No murmur.  Pulmonary:     Effort: Pulmonary effort is normal.     Breath sounds: Normal breath sounds. No wheezing or rales.  Chest:     Chest wall: No tenderness.  Abdominal:     General: Bowel sounds are normal. There is no distension.     Palpations: Abdomen is soft. There is no mass.     Tenderness: There is no abdominal tenderness.  Genitourinary:    Comments: Uncircumcised No rash evident on penis Musculoskeletal:        General: Normal range of motion.     Right lower leg: No edema.     Left lower leg: No edema.  Neurological:     Mental Status: He is alert  and oriented to person, place, and time.  Psychiatric:        Mood and Affect: Mood normal.     CMP Latest Ref Rng & Units 11/05/2019 11/02/2019 11/02/2019  Glucose 70 - 99 mg/dL 117(H) 108(H) 123(H)  BUN 6 - 20 mg/dL 11 12 10   Creatinine 0.61 - 1.24 mg/dL 0.75 0.80 0.97  Sodium 135 - 145 mmol/L 142 141 142  Potassium 3.5 - 5.1 mmol/L 4.3 4.7 4.7  Chloride 98 - 111 mmol/L 107 102 103  CO2 22 - 32 mmol/L 25 - 27  Calcium 8.9 - 10.3 mg/dL 9.3 - 9.3  Total Protein 6.5 -  8.1 g/dL - - 7.2  Total Bilirubin 0.3 - 1.2 mg/dL - - 0.4  Alkaline Phos 38 - 126 U/L - - 86  AST 15 - 41 U/L - - 22  ALT 0 - 44 U/L - - 33    Lipid Panel  No results found for: CHOL, TRIG, HDL, CHOLHDL, VLDL, LDLCALC, LDLDIRECT  CBC    Component Value Date/Time   WBC 5.5 11/05/2019 1625   RBC 5.25 11/05/2019 1625   HGB 16.4 11/05/2019 1625   HCT 48.7 11/05/2019 1625   PLT 247 11/05/2019 1625   MCV 92.8 11/05/2019 1625   MCH 31.2 11/05/2019 1625   MCHC 33.7 11/05/2019 1625   RDW 12.5 11/05/2019 1625   LYMPHSABS 2.1 11/02/2019 1503   MONOABS 0.6 11/02/2019 1503   EOSABS 0.2 11/02/2019 1503   BASOSABS 0.1 11/02/2019 1503    No results found for: HGBA1C  Assessment & Plan:   1. 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  Dispense: 56.7 g; Refill: 1  2. Aortic atherosclerosis (HCC) Aortic atherosclerosis noted on CT scan Discussed low cholesterol diet, risk factor modification Initiate atorvastatin He is insistent on undergoing a  stress test and I have referred him to cardiology - atorvastatin (LIPITOR) 20 MG tablet; Take 1 tablet (20 mg total) by mouth daily.  Dispense: 30 tablet; Refill: 3 - Ambulatory referral to  Cardiology   Return for Medical conditions, keep previously scheduled appointment.   Charlott Rakes, MD, FAAFP. [facility] and [facility] [place], [place]   11/26/2019, 9:14 AM

## 2019-11-26 NOTE — Telephone Encounter (Signed)
Patient called requesting for his referral to be sent to the Neurologist at [facility].   [phone] Fax number   Please see note of 11/19/19

## 2019-11-26 NOTE — Patient Instructions (Signed)
Balanitis  Balanitis is swelling and irritation (inflammation) of the head of the penis (glans penis). The condition may also cause inflammation of the skin around the glans penis (foreskin) in men who have not been circumcised. It may develop because of an infection or another medical condition. Balanitis occurs most often among men who have not had their foreskin removed (uncircumcised men). Balanitis sometimes causes scarring of the penis or foreskin, which can require surgery. Untreated balanitis can increase the risk of penile cancer. What are the causes? Common causes of this condition include:  Poor personal hygiene, especially in uncircumcised men. Not cleaning the glans penis and foreskin well can result in buildup of bacteria, viruses, and yeast, which can lead to infection and inflammation.  Irritation and lack of air flow due to fluid (smegma) that can build up on the glans penis. Other causes include:  Chemical irritation from products such as soaps or shower gels (especially those that have fragrance), condoms, personal lubricants, petroleum jelly, spermicides, or fabric softeners.  Skin conditions, such as eczema, dermatitis, and psoriasis.  Allergies to medicines, such as tetracycline and sulfa drugs.  Certain medical conditions, including liver cirrhosis, congestive heart failure, diabetes, and kidney disease.  Infections, such as candidiasis, HPV (human papillomavirus), herpes simplex, gonorrhea, and syphilis.  Severe obesity. What increases the risk? The following factors may make you more likely to develop this condition:  Having diabetes. This is the most common risk factor.  Having a tight foreskin that is difficult to pull back (retract) past the glans.  Having sexual intercourse without using a condom. What are the signs or symptoms? Symptoms of this condition include:  Discharge from under the foreskin.  A bad smell.  Pain or difficulty retracting the  foreskin.  Tenderness, redness, and swelling of the glans.  A rash or sores on the glans or foreskin.  Itchiness.  Inability to get an erection due to pain.  Difficulty urinating.  Scarring of the penis or foreskin, in some cases. How is this diagnosed? This condition may be diagnosed based on:  A physical exam.  Testing a swab of discharge to check for bacterial or fungal infection.  Blood tests: ? To check for viruses that can cause balanitis. ? To check your blood sugar (glucose) level. High blood glucose could be a sign of diabetes, which can cause balanitis. How is this treated? Treatment for balanitis depends on the cause. Treatment may include:  Improving personal hygiene. Your health care provider may recommend sitting in a bath of warm water that is deep enough to cover your hips and buttocks (sitz bath).  Medicines such as: ? Creams or ointments to reduce swelling (steroids) or to treat an infection. ? Antibiotic medicine. ? Antifungal medicine.  Surgery to remove or cut the foreskin (circumcision). This may be done if you have scarring on the foreskin that makes it difficult to retract.  Controlling other medical problems that may be causing your condition or making it worse. Follow these instructions at home:  Do not have sex until the condition clears up, or until your health care provider approves.  Keep your penis clean and dry. Take sitz baths as recommended by your health care provider.  Avoid products that irritate your skin or make symptoms worse, such as soaps and shower gels that have fragrance.  Take over-the-counter and prescription medicines only as told by your health care provider. ? If you were prescribed an antibiotic medicine or a cream or ointment, use it as   told by your health care provider. Do not stop using your medicine, cream, or ointment even if you start to feel better. ? Do not drive or use heavy machinery while taking prescription  pain medicine. Contact a health care provider if:  Your symptoms get worse or do not improve with home care.  You develop chills or a fever.  You have trouble urinating.  You cannot retract your foreskin. Get help right away if:  You develop severe pain.  You are unable to urinate. Summary  Balanitis is inflammation of the head of the penis (glans penis) caused by irritation or infection.  Balanitis causes pain, redness, and swelling of the glans penis.  This condition is most common among uncircumcised men who do not keep their glans penis clean and in men who have diabetes.  Treatment may include creams or ointments.  Good hygiene is important for prevention. This includes pulling back the foreskin when washing your penis. This information is not intended to replace advice given to you by your health care provider. Make sure you discuss any questions you have with your health care provider. Document Revised: 06/16/2017 Document Reviewed: 05/23/2016 Elsevier Patient Education  2020 Elsevier Inc.  

## 2019-11-26 NOTE — Progress Notes (Signed)
Patient states that he has a rash on his penis area, also states that he is having trouble urinating.

## 2019-11-27 ENCOUNTER — Ambulatory Visit: Payer: Medicaid Other | Admitting: Diagnostic Neuroimaging

## 2019-11-27 ENCOUNTER — Encounter: Payer: Self-pay | Admitting: Diagnostic Neuroimaging

## 2019-11-27 ENCOUNTER — Telehealth: Payer: Self-pay | Admitting: *Deleted

## 2019-11-27 VITALS — BP 137/83 | HR 90 | Temp 96.8°F | Ht 69.0 in | Wt 201.8 lb

## 2019-11-27 DIAGNOSIS — R569 Unspecified convulsions: Secondary | ICD-10-CM

## 2019-11-27 DIAGNOSIS — D320 Benign neoplasm of cerebral meninges: Secondary | ICD-10-CM | POA: Diagnosis not present

## 2019-11-27 NOTE — Telephone Encounter (Signed)
Joshua [name] called after his office visit with Dr Leta Baptist. Is requesting a phone visit with Dr Mickeal Skinner to follow up and discuss situation.   Is wanting to know if he can increase Remeron to 2 tablets qHS. States he is not sleeping well with just 1 tablet.

## 2019-11-27 NOTE — Progress Notes (Signed)
[facility]  PATIENT: Joshua [name] DOB: 1961-02-22  REFERRING CLINICIAN: Charlott Rakes, MD HISTORY FROM: patient  REASON FOR VISIT: new consult / second opinion   HISTORICAL  CHIEF COMPLAINT:  Chief Complaint  Patient presents with  . Seizures    rm 6, New Pt "since surgery for meningioma last summer I've had seizures; had seizure last week, no missed doses, was on Keppra but had anger issues; I am still irritable"   . Recurrent meningioma    HISTORY OF PRESENT ILLNESS:   59 year old male here for evaluation of seizures.  Patient requested second opinion.  2014 patient had headaches and was found to have a meningioma which was treated by surgical resection.  2019 patient started to have numbness and twitching of the left leg spreading the left arm.  MRI showed recurrent tumor.  He underwent another resection in 2020.  He was treated with antiseizure medications.  Patient has been maintained on levetiracetam and Dilantin since that time.  More recently this was changed to Dilantin and Lamictal, due to mood lability from [place].  In last 1 month patient has been under increased stress, now undergoing separation from his wife.  He has had increasing anxiety.  He has used lorazepam, clonazepam, Remeron.  Patient having ongoing episodes of numbness and abnormal sensation on his left side.  Patient requested second opinion evaluation locally as well as at [facility].   REVIEW OF SYSTEMS: Full 14 system review of systems performed and negative with exception of: As per HPI.  ALLERGIES: No Known Allergies  HOME MEDICATIONS: Outpatient Medications Prior to Visit  Medication Sig Dispense Refill  . aspirin EC 81 MG EC tablet Take 1 tablet (81 mg total) by mouth daily. 30 tablet 3  . atorvastatin (LIPITOR) 20 MG tablet Take 1 tablet (20 mg total) by mouth daily. 30 tablet 3  . clonazePAM (KLONOPIN) 0.5 MG tablet Take 1 tablet (0.5 mg total) by mouth 2 (two)  times daily. 60 tablet 1  . cyclobenzaprine (FLEXERIL) 10 MG tablet Take 1 tablet (10 mg total) by mouth 2 (two) times daily as needed for muscle spasms. 60 tablet 3  . FIBER ADULT GUMMIES PO Take 1 tablet by mouth daily.    Marland Kitchen ibuprofen (ADVIL) 200 MG tablet Take 400 mg by mouth daily as needed for moderate pain.    Marland Kitchen lamoTRIgine (LAMICTAL) 100 MG tablet Take 2 tablets (200 mg total) by mouth 2 (two) times daily. 120 tablet 2  . LORazepam (ATIVAN) 1 MG tablet Take 1 tablet (1 mg total) by mouth every 8 (eight) hours as needed for seizure (for breakthrough seizures). 30 tablet 0  . meloxicam (MOBIC) 7.5 MG tablet Take 1 tablet (7.5 mg total) by mouth daily. 30 tablet 1  . Miconazole Nitrate 2 % AERP Apply to affected area BID as needed 85 g 0  . mirtazapine (REMERON) 15 MG tablet Take 1 tablet (15 mg total) by mouth at bedtime. 30 tablet 3  . ondansetron (ZOFRAN-ODT) 4 MG disintegrating tablet DISSOLVE 1 TABLET(4 MG) ON THE TONGUE EVERY 8 HOURS AS NEEDED FOR NAUSEA OR VOMITING 30 tablet 0  . phenytoin (DILANTIN) 200 MG ER capsule Take 1 capsule (200 mg total) by mouth 2 (two) times daily. 60 capsule 3  . vitamin C (ASCORBIC ACID) 500 MG tablet Take 500 mg by mouth daily.    . Clotrimazole 1 % OINT Apply 1 each topically in the morning and at bedtime. (Patient not taking: Reported on  11/27/2019) 56.7 g 1  . furosemide (LASIX) 20 MG tablet Take 1 tablet (20 mg total) by mouth daily. Prn swelling.  Use sparingly (Patient not taking: Reported on 10/24/2019) 30 tablet 1  . sucralfate (CARAFATE) 1 g tablet Take 1 tablet (1 g total) by mouth 4 (four) times daily -  with meals and at bedtime for 14 days. (Patient not taking: Reported on 11/02/2019) 56 tablet 0   No facility-administered medications prior to visit.    PAST MEDICAL HISTORY: Past Medical History:  Diagnosis Date  . Brain tumor ([place]) 02/20/2013   brain tumor removed in March 2014, Dr Donald Pore  . Dizziness   . Enlarged prostate   .  Headache(784.0)    scattered  . Meningioma ([place])   . Seizures ([place])    11/27/19 last sz 1 wk ago    PAST SURGICAL HISTORY: Past Surgical History:  Procedure Laterality Date  . APPLICATION OF CRANIAL NAVIGATION N/A 01/10/2019   Procedure: APPLICATION OF CRANIAL NAVIGATION;  Surgeon: Erline Levine, MD;  Location: [place];  Service: Neurosurgery;  Laterality: N/A;  . CATARACT EXTRACTION Right   . CRANIOTOMY Right 11/06/2012   Procedure: CRANIOTOMY TUMOR EXCISION;  Surgeon: Erline Levine, MD;  Location: [place] [facility];  Service: Neurosurgery;  Laterality: Right;  Right Parasagittal craniotomy for meningioma with Stealth  . CRANIOTOMY Right 01/10/2019   Procedure: Right Parasagittal Craniotomy for Tumor;  Surgeon: Erline Levine, MD;  Location: [place];  Service: Neurosurgery;  Laterality: Right;  Right parasagittal craniotomy for tumor  . ESOPHAGOGASTRODUODENOSCOPY    . right knee arthroscopy      FAMILY HISTORY: Family History  Problem Relation Age of Onset  . Hypertension Mother   . Hypertension Father     SOCIAL HISTORY: Social History   Socioeconomic History  . Marital status: Married    Spouse name: Not on file  . Number of children: 2  . Years of education: Not on file  . Highest education level: Not on file  Occupational History  . Not on file  Tobacco Use  . Smoking status: Former Smoker    Types: Cigarettes    Quit date: 01/29/2019    Years since quitting: 0.8  . Smokeless tobacco: Never Used  . Tobacco comment: hasn't smoked in a week  Substance and Sexual Activity  . Alcohol use: Yes    Alcohol/week: 8.0 standard drinks    Types: 6 Cans of beer, 2 Shots of liquor per week    Comment: occasionally, 11/27/19 none  . Drug use: No  . Sexual activity: Yes  Other Topics Concern  . Not on file  Social History Narrative   Lives with wife   Caffeine- soda occass   Social Determinants of Health   Financial Resource Strain:   . Difficulty of Paying Living Expenses:   Food  Insecurity:   . Worried About Charity fundraiser in the Last Year:   . Arboriculturist in the Last Year:   Transportation Needs: No Transportation Needs  . Lack of Transportation (Medical): No  . Lack of Transportation (Non-Medical): No  Physical Activity:   . Days of Exercise per Week:   . Minutes of Exercise per Session:   Stress:   . Feeling of Stress :   Social Connections:   . Frequency of Communication with Friends and Family:   . Frequency of Social Gatherings with Friends and Family:   . Attends Religious Services:   . Active Member of Clubs or  Organizations:   . Attends Archivist Meetings:   Marland Kitchen Marital Status:   Intimate Partner Violence:   . Fear of Current or Ex-Partner:   . Emotionally Abused:   Marland Kitchen Physically Abused:   . Sexually Abused:      PHYSICAL EXAM  GENERAL EXAM/CONSTITUTIONAL: Vitals:  Vitals:   11/27/19 1321  BP: 137/83  Pulse: 90  Temp: (!) 96.8 F (36 C)  Weight: 201 lb 12.8 oz (91.5 kg)  Height: 5\' 9"  (1.753 m)     Body mass index is 29.8 kg/m. Wt Readings from Last 3 Encounters:  11/27/19 201 lb 12.8 oz (91.5 kg)  11/26/19 203 lb 12.8 oz (92.4 kg)  11/02/19 180 lb (81.6 kg)     Patient is in no distress; well developed, nourished and groomed; neck is supple  CARDIOVASCULAR:  Examination of carotid arteries is normal; no carotid bruits  Regular rate and rhythm, no murmurs  Examination of peripheral vascular system by observation and palpation is normal  EYES:  Ophthalmoscopic exam of optic discs and posterior segments is normal; no papilledema or hemorrhages  No exam data present  MUSCULOSKELETAL:  Gait, strength, tone, movements noted in Neurologic exam below  NEUROLOGIC: MENTAL STATUS:  No flowsheet data found.  awake, alert, oriented to person, place and time  recent and remote memory intact  normal attention and concentration  language fluent, comprehension intact, naming intact  fund of knowledge  appropriate  CRANIAL NERVE:   2nd - no papilledema on fundoscopic exam  2nd, 3rd, 4th, 6th - pupils equal and reactive to light, visual fields full to confrontation, extraocular muscles intact, no nystagmus  5th - facial sensation symmetric  7th - facial strength symmetric  8th - hearing intact  9th - palate elevates symmetrically, uvula midline  11th - shoulder shrug symmetric  12th - tongue protrusion midline  MOTOR:   normal bulk and tone, full strength in the BUE, BLE  SENSORY:   normal and symmetric to light touch, temperature, vibration  COORDINATION:   finger-nose-finger, fine finger movements normal  REFLEXES:   deep tendon reflexes present and symmetric  GAIT/STATION:   narrow based gait     DIAGNOSTIC DATA (LABS, IMAGING, TESTING) - I reviewed patient records, labs, notes, testing and imaging myself where available.  Lab Results  Component Value Date   WBC 5.5 11/05/2019   HGB 16.4 11/05/2019   HCT 48.7 11/05/2019   MCV 92.8 11/05/2019   PLT 247 11/05/2019      Component Value Date/Time   NA 142 11/05/2019 1625   K 4.3 11/05/2019 1625   CL 107 11/05/2019 1625   CO2 25 11/05/2019 1625   GLUCOSE 117 (H) 11/05/2019 1625   BUN 11 11/05/2019 1625   CREATININE 0.75 11/05/2019 1625   CALCIUM 9.3 11/05/2019 1625   PROT 7.2 11/02/2019 1503   ALBUMIN 4.3 11/02/2019 1503   AST 22 11/02/2019 1503   ALT 33 11/02/2019 1503   ALKPHOS 86 11/02/2019 1503   BILITOT 0.4 11/02/2019 1503   GFRNONAA >60 11/05/2019 1625   GFRAA >60 11/05/2019 1625   No results found for: CHOL, HDL, LDLCALC, LDLDIRECT, TRIG, CHOLHDL No results found for: HGBA1C No results found for: VITAMINB12 No results found for: TSH   11/05/19 MRI brain  - Unchanged postoperative appearance of the brain with gliosis at the right greater than left vertex. 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 No intravenous contrast agent was administered which limits  assessment for residual or  recurrent tumor.    ASSESSMENT AND PLAN  59 y.o. year old male here with:  Dx:  1. Meningioma, recurrent of brain ([place])   2. Seizures ([place])      PLAN:  SEIZURE DISORDER (partial sz; h/o meningioma s/p surgery x 2) - continue dilantin 200mg  twice a day + lamotrigine 100mg  twice a day  - may consider increasing lamotrigine up to 200mg  twice a day; or could consider adding on vimpat or gabapentin - follow up with Dr. Mickeal Skinner  ANXIETY / DEPRESSION / STRESS - consider psychiatry / psychology evaluation  Return for return to PCP and Dr. Mickeal Skinner.    Penni Bombard, MD [id number], [id number] PM Certified in Neurology, Neurophysiology and Neuroimaging  [facility] [address] [place], [place] [address] [phone]

## 2019-11-27 NOTE — Patient Instructions (Addendum)
  SEIZURE DISORDER (partial sz; h/o meningioma s/p surgery x 2) - continue dilantin 200mg  twice a day + lamotrigine 100mg  twice a day  - follow up with Dr. Mickeal Skinner  ANXIETY / DEPRESSION / STRESS - follow up with PCP; consider psychiatry / psychology evaluation

## 2019-11-27 NOTE — Telephone Encounter (Signed)
Gm  Noted  sent to [facility] Neurology . Thank you .

## 2019-11-29 ENCOUNTER — Other Ambulatory Visit: Payer: Self-pay

## 2019-11-29 ENCOUNTER — Ambulatory Visit: Payer: Medicaid Other

## 2019-11-29 DIAGNOSIS — M6281 Muscle weakness (generalized): Secondary | ICD-10-CM | POA: Diagnosis not present

## 2019-11-29 DIAGNOSIS — M25551 Pain in right hip: Secondary | ICD-10-CM | POA: Diagnosis not present

## 2019-11-29 DIAGNOSIS — R2689 Other abnormalities of gait and mobility: Secondary | ICD-10-CM | POA: Diagnosis not present

## 2019-11-29 DIAGNOSIS — M25651 Stiffness of right hip, not elsewhere classified: Secondary | ICD-10-CM | POA: Diagnosis not present

## 2019-11-29 NOTE — Therapy (Signed)
[facility] [address] [place], [place], [address] Phone: [phone]   Fax:  [phone]  Physical Therapy Treatment  Patient Details  Name: Joshua [name] MRN: [id number] Date of Birth: 27-Jan-1961 Referring Provider (PT): Cecil Cobbs   Encounter Date: 11/29/2019  PT End of Session - 11/29/19 0719    Visit Number  14    Number of Visits  28    Date for PT Re-Evaluation  01/30/20   POC for 9 weeks, Cert for 90 days   Authorization Type  Medicaid 3 visits 06/03/11/02/2019, 12/10-12/30/20 6 units, 1/7-1/27/2021 3 visits, 1/29-3/4 10 visits, 3 visits 3/8-3/28, 3 visits 4/16-5/6, 4 visits 5/7-6/3    Authorization - Visit Number  2    Authorization - Number of Visits  4    PT Start Time  0716    PT Stop Time  0758    PT Time Calculation (min)  42 min    Equipment Utilized During Treatment  Gait belt    Activity Tolerance  Patient tolerated treatment well;No increased pain    Behavior During Therapy  WFL for tasks assessed/performed       Past Medical History:  Diagnosis Date  . Brain tumor ([place]) 02/20/2013   brain tumor removed in March 2014, Dr Donald Pore  . Dizziness   . Enlarged prostate   . Headache(784.0)    scattered  . Meningioma ([place])   . Seizures ([place])    11/27/19 last sz 1 wk ago    Past Surgical History:  Procedure Laterality Date  . APPLICATION OF CRANIAL NAVIGATION N/A 01/10/2019   Procedure: APPLICATION OF CRANIAL NAVIGATION;  Surgeon: Erline Levine, MD;  Location: [facility];  Service: Neurosurgery;  Laterality: N/A;  . CATARACT EXTRACTION Right   . CRANIOTOMY Right 11/06/2012   Procedure: CRANIOTOMY TUMOR EXCISION;  Surgeon: Erline Levine, MD;  Location: [facility];  Service: Neurosurgery;  Laterality: Right;  Right Parasagittal craniotomy for meningioma with Stealth  . CRANIOTOMY Right 01/10/2019   Procedure: Right Parasagittal Craniotomy for Tumor;  Surgeon: Erline Levine, MD;  Location: [facility];   Service: Neurosurgery;  Laterality: Right;  Right parasagittal craniotomy for tumor  . ESOPHAGOGASTRODUODENOSCOPY    . right knee arthroscopy      There were no vitals filed for this visit.  Subjective Assessment - 11/29/19 0719    Subjective  Pt saw neurologist yesterday. No changes to meds. He still follows with Dr. 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 Pt reports he had seizure episode yesterday. Had been about a week since last one. Reports that lasted about 5 min until meds got in his system.    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 History of right THA on 2019.    Patient Stated Goals  Pt would like to be able to elimate his cane use.    Currently in Pain?  No/denies    Pain Onset  More than a month ago                        [facility] Adult PT Treatment/Exercise - 11/29/19 [id number]      Ambulation/Gait   Ambulation/Gait  Yes    Gait Comments  Gait on treadmill x 4 min at 0.58mph with verbal cues to increase left step length and foot clearance with more heel strike and try to stand up tall.  Pt with heavy UE support and leaning forward some on arms. Placed in BWS with ~20# unweighted more for safety and to allow pt to relax arms more and performed 4 more minutes at 0.7mph. PT assisted at times on LLE to increase step length. BP=148/94 after and after sitting 5 min BP=148/90      Neuro Re-ed    Neuro Re-ed Details   In quadruped: hip flexion x 10 each leg stepping forward and back with tactile cues to keep back level,  tall kneeling trying to maintain upright posture x 30 sec, tall kneeling with hands on green physioball alternating shoulder flexion x 10, moving physioball out in diagonals x 5 to each side using core to pull back upright, quadruped alternating hip ext x 5 each side with verbal cues to keep back and hips level. PT  provided tactile cues to help with posture with activities.             PT Education - 11/29/19 1109    Education Details  Pt to continue with current HEP    Person(s) Educated  Patient    Methods  Explanation    Comprehension  Verbalized understanding       PT Short Term Goals - 11/29/19 1109      PT SHORT TERM GOAL #1   Title  Pt will be independent with HEP to continue strength and balance gains on own.    Baseline  Pt reports he has been working on [employer] and continues to walk for exercise.    Time  4    Period  Weeks    Status  Achieved    Target Date  11/29/19      PT SHORT TERM GOAL #2   Title  Pt will reduce TUG to <10 seconds w/ AD to demonstrate improved ambulation and further reduce risk for falls.    Baseline  11.81 seconds w/o AD, 10.92 seconds w/ AD    Time  4    Period  Weeks    Status  On-going    Target Date  11/29/19        PT Long Term Goals - 11/01/19 0917      PT LONG TERM GOAL #1   Title  Pt will increase FGA from 21/30 to >23/30 for improved balance and gait safety.    Baseline  21/30 on 09/13/19, 18/30 on 4/16    Time  9    Period  Weeks    Status  New    Target Date  01/03/20      PT LONG TERM GOAL #2   Title  Pt will ambulate >700' on varied surfaces independently without AD for improved community mobility.    Baseline  Ambulated 500' without AD today, intermittent CGA. Currently using SPC inside/outside of home.    Time  9    Period  Weeks    Status  New    Target Date  01/03/20      PT LONG TERM GOAL #3   Title  Pt will be independent with progressive HEP to continue strength and balance gains on own.    Baseline  HEP continued to be reassesed    Time  9    Period  Weeks    Status  New    Target Date  01/03/20            Plan - 11/29/19 1110    Clinical Impression Statement  PT focused more on hip and  core strength today in quadruped and tall kneeling. Pt was challenged with these activities to keep upright posture. Pt  able to show improvements in step length when placed in BWS to help him relax arms more and with PT assisting at left leg minimally at times to help facilitate longer step.    Examination-Activity Limitations  Locomotion Level;Stairs;Dressing    Examination-Participation Restrictions  Community Activity    Stability/Clinical Decision Making  Evolving/Moderate complexity    Rehab Potential  Good    PT Frequency  2x / week    PT Duration  Other (comment)   9 Weeks   PT Treatment/Interventions  ADLs/Self Care Home Management;DME Instruction;Gait training;Stair training;Functional mobility training;Therapeutic activities;Patient/family education;Neuromuscular re-education;Balance training;Therapeutic exercise;Orthotic Fit/Training;Manual techniques;Passive range of motion;Aquatic Therapy    PT Next Visit Plan  Check TUG goal. Continue gait training working on improved toe off and left hip flexion. Try BWS over treadmill again. Used XL harness. Strengthening and Stretching exercises    Consulted and Agree with Plan of Care  Patient       Patient will benefit from skilled therapeutic intervention in order to improve the following deficits and impairments:  Abnormal gait, Decreased activity tolerance, Decreased balance, Difficulty walking, Decreased strength, Impaired sensation, Postural dysfunction, Impaired flexibility, Decreased mobility, Decreased range of motion  Visit Diagnosis: Other abnormalities of gait and mobility  Muscle weakness (generalized)     Problem List Patient Active Problem List   Diagnosis Date Noted  . 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[place]) 03/07/2019  . Seizures ([place]) 01/31/2019  . Meningioma, recurrent of brain ([place]) 01/31/2019  . Meningioma ([place]) 01/03/2019  . Brain tumor ([place]) 12/27/2018  . Diffuse idiopathic skeletal hyperostosis 06/05/2013    Electa Sniff, PT, DPT, NCS 11/29/2019, 11:12 AM  [facility] [address] [place], [place], [address] Phone: [phone]   Fax:  [phone]  Name: TYLIK LAFEVER MRN: [id number] Date of Birth: December 30, 1960

## 2019-12-02 ENCOUNTER — Emergency Department (HOSPITAL_COMMUNITY)
Admission: EM | Admit: 2019-12-02 | Discharge: 2019-12-03 | Disposition: A | Discharge status: Home / Self Care | Payer: Medicaid Other | Attending: Emergency Medicine | Admitting: Emergency Medicine

## 2019-12-02 ENCOUNTER — Other Ambulatory Visit: Payer: Self-pay | Admitting: Internal Medicine

## 2019-12-02 ENCOUNTER — Encounter (HOSPITAL_COMMUNITY): Payer: Self-pay | Admitting: Emergency Medicine

## 2019-12-02 ENCOUNTER — Telehealth: Payer: Self-pay | Admitting: Family Medicine

## 2019-12-02 ENCOUNTER — Other Ambulatory Visit: Payer: Self-pay

## 2019-12-02 DIAGNOSIS — Z87891 Personal history of nicotine dependence: Secondary | ICD-10-CM | POA: Insufficient documentation

## 2019-12-02 DIAGNOSIS — R202 Paresthesia of skin: Secondary | ICD-10-CM | POA: Insufficient documentation

## 2019-12-02 DIAGNOSIS — Z79899 Other long term (current) drug therapy: Secondary | ICD-10-CM | POA: Diagnosis not present

## 2019-12-02 DIAGNOSIS — R569 Unspecified convulsions: Secondary | ICD-10-CM | POA: Insufficient documentation

## 2019-12-02 DIAGNOSIS — Z7982 Long term (current) use of aspirin: Secondary | ICD-10-CM | POA: Diagnosis not present

## 2019-12-02 DIAGNOSIS — G40909 Epilepsy, unspecified, not intractable, without status epilepticus: Secondary | ICD-10-CM | POA: Diagnosis not present

## 2019-12-02 LAB — BASIC METABOLIC PANEL
Anion gap: 11 (ref 5–15)
BUN: 7 mg/dL (ref 6–20)
CO2: 26 mmol/L (ref 22–32)
Calcium: 9.2 mg/dL (ref 8.9–10.3)
Chloride: 104 mmol/L (ref 98–111)
Creatinine, Ser: 0.98 mg/dL (ref 0.61–1.24)
GFR calc Af Amer: >60 mL/min (ref >60)
GFR calc non Af Amer: >60 mL/min (ref >60)
Glucose, Bld: 134 mg/dL — ABNORMAL HIGH (ref 70–99)
Potassium: 4.3 mmol/L (ref 3.5–5.1)
Sodium: 141 mmol/L (ref 135–145)

## 2019-12-02 LAB — CBC
HCT: 45.6 % (ref 39.0–52.0)
Hemoglobin: 15.3 g/dL (ref 13.0–17.0)
MCH: 30.8 pg (ref 26.0–34.0)
MCHC: 33.6 g/dL (ref 30.0–36.0)
MCV: 91.8 fL (ref 80.0–100.0)
Platelets: 245 10*3/uL (ref 150–400)
RBC: 4.97 MIL/uL (ref 4.22–5.81)
RDW: 12.6 % (ref 11.5–15.5)
WBC: 6.5 10*3/uL (ref 4.0–10.5)
nRBC: 0 % (ref 0.0–0.2)

## 2019-12-02 NOTE — ED Triage Notes (Signed)
Pt reports having two witnessed seizures at home today.  Hx of such, since having a brain tumor removed.  Pt has a tremor at this time and states "my legs go numb when I am about to have one."  Suppose to go for a follow up tomorrow with provider.

## 2019-12-02 NOTE — Telephone Encounter (Signed)
Patient has been set up with an appointment to discuss matter with PCP

## 2019-12-02 NOTE — Telephone Encounter (Signed)
Rx request 

## 2019-12-02 NOTE — ED Provider Notes (Signed)
[facility] EMERGENCY DEPARTMENT Provider Note   CSN: [id number] Arrival date & time: 12/02/19  [id number]     History Chief Complaint  Patient presents with  . Seizures    Joshua [name] is a 59 y.o. male with a history of seizures 2/2 recurrent meningioma (initial surgical resection in 2014 and 2020) who presents the emergency department with a chief complaint of seizures.  The patient reports that at approximately 15:00 the patient returned from driving to [facility].  Reports that he developed sudden onset burning in the soles of his bilateral feet accompanied by pins and needle paresthesias in his bilateral legs and tightness in his left thigh and right hip.  He reports that he began to have a seizure with tremors and jerking of the left leg, but this subsided with Ativan.  States that symptoms have persisted since onset, but are somewhat improved from previous.   He states that the symptoms that he presented with today have happened individually and first began occurring a week ago.   The patient reports that he has been compliant with no missed doses of his home clonazepam, Lamictal, and phenytoin.  He reports that he has lorazepam to take as needed if felt tingling "because that could be a seizure coming on".  He took 1 dose earlier today.  Reports that the last time that he needed a dose was ~2 weeks ago  He denies fever, chills, headache, back pain, urinary or fecal incontinence, numbness, weakness, chest pain, shortness of breath, slurred speech, facial droop, dizziness, lightheadedness.  He has been able to walk since the symptoms began.  No other recent changes in his medication.  Oncology: Dr. Mickeal Skinner has an appointment tomorrow, 5/18 Neurology: Dr. Leta Baptist seen on 5/12  The history is provided by the patient and medical records. No language interpreter was used.       Past Medical History:  Diagnosis Date  . Brain tumor ([place]) 02/20/2013   brain tumor  removed in March 2014, Dr Donald Pore  . Dizziness   . Enlarged prostate   . Headache(784.0)    scattered  . Meningioma ([place])   . Seizures ([place])    11/27/19 last sz 1 wk ago    Patient Active Problem List   Diagnosis Date Noted  . 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[place]) 03/07/2019  . Seizures ([place]) 01/31/2019  . Meningioma, recurrent of brain ([place]) 01/31/2019  . Meningioma ([place]) 01/03/2019  . Brain tumor ([place]) 12/27/2018  . Diffuse idiopathic skeletal hyperostosis 06/05/2013    Past Surgical History:  Procedure Laterality Date  . APPLICATION OF CRANIAL NAVIGATION N/A 01/10/2019   Procedure: APPLICATION OF CRANIAL NAVIGATION;  Surgeon: Erline Levine, MD;  Location: [facility];  Service: Neurosurgery;  Laterality: N/A;  . CATARACT EXTRACTION Right   . CRANIOTOMY Right 11/06/2012   Procedure: CRANIOTOMY TUMOR EXCISION;  Surgeon: Erline Levine, MD;  Location: [facility];  Service: Neurosurgery;  Laterality: Right;  Right Parasagittal craniotomy for meningioma with Stealth  . CRANIOTOMY Right 01/10/2019   Procedure: Right Parasagittal Craniotomy for Tumor;  Surgeon: Erline Levine, MD;  Location: [facility];  Service: Neurosurgery;  Laterality: Right;  Right parasagittal craniotomy for tumor  . ESOPHAGOGASTRODUODENOSCOPY    . right knee arthroscopy         Family History  Problem Relation Age of Onset  . Hypertension Mother   . Hypertension Father     Social History   Tobacco Use  . Smoking status: Former Smoker  Types: Cigarettes    Quit date: 01/29/2019    Years since quitting: 0.8  . Smokeless tobacco: Never Used  . Tobacco comment: hasn't smoked in a week  Substance Use Topics  . Alcohol use: Yes    Alcohol/week: 8.0 standard drinks    Types: 6 Cans of beer, 2 Shots of liquor per week    Comment: occasionally, 11/27/19 none  . Drug use: No    Home Medications Prior to Admission medications   Medication Sig Start Date End Date Taking? Authorizing Provider  aspirin EC  81 MG EC tablet Take 1 tablet (81 mg total) by mouth daily. 01/14/19  Yes Erline Levine, MD  atorvastatin (LIPITOR) 20 MG tablet Take 1 tablet (20 mg total) by mouth daily. Patient not taking: Reported on 12/03/2019 11/26/19  Yes Charlott Rakes, MD  clonazePAM (KLONOPIN) 0.5 MG tablet Take 1 tablet (0.5 mg total) by mouth 2 (two) times daily. 10/14/19  Yes Vaslow, Acey Lav, MD  cyclobenzaprine (FLEXERIL) 10 MG tablet Take 1 tablet (10 mg total) by mouth 2 (two) times daily as needed for muscle spasms. Patient not taking: Reported on 12/03/2019 10/24/19  Yes Charlott Rakes, MD  FIBER ADULT GUMMIES PO Take 2 tablets by mouth daily.    Yes [provider]  ibuprofen (ADVIL) 600 MG tablet Take 600 mg by mouth every 6 (six) hours as needed.   Yes [provider]  lamoTRIgine (LAMICTAL) 100 MG tablet Take 2 tablets (200 mg total) by mouth 2 (two) times daily. 10/31/19  Yes Vaslow, Acey Lav, MD  meloxicam (MOBIC) 7.5 MG tablet Take 1 tablet (7.5 mg total) by mouth daily. Patient not taking: Reported on 12/03/2019 10/24/19  Yes Newlin, Enobong, MD  ondansetron (ZOFRAN-ODT) 4 MG disintegrating tablet DISSOLVE 1 TABLET(4 MG) ON THE TONGUE EVERY 8 HOURS AS NEEDED FOR NAUSEA OR VOMITING Patient taking differently: Take 4 mg by mouth every 8 (eight) hours as needed for nausea or vomiting.  11/18/19  Yes Vaslow, Acey Lav, MD  phenytoin (DILANTIN) 200 MG ER capsule Take 1 capsule (200 mg total) by mouth 2 (two) times daily. 10/22/19  Yes Vaslow, Acey Lav, MD  vitamin C (ASCORBIC ACID) 500 MG tablet Take 500 mg by mouth daily.   Yes [provider]  gabapentin (NEURONTIN) 300 MG capsule Take 1 capsule (300 mg total) by mouth 2 (two) times daily. 12/03/19   Vaslow, Acey Lav, MD  LORazepam (ATIVAN) 1 MG tablet Take 1 tablet (1 mg total) by mouth every 8 (eight) hours as needed for seizure (for breakthrough seizures). 12/03/19   [name], MD  cetirizine (ZYRTEC) 10 MG tablet Take 1 tablet (10  mg total) by mouth daily. 08/08/19 09/08/19  Argentina Donovan, PA-C  furosemide (LASIX) 20 MG tablet Take 1 tablet (20 mg total) by mouth daily. Prn swelling.  Use sparingly Patient not taking: Reported on 10/24/2019 08/22/19 12/03/19  Argentina Donovan, PA-C  mirtazapine (REMERON) 15 MG tablet Take 1 tablet (15 mg total) by mouth at bedtime. Patient not taking: Reported on 12/02/2019 10/11/19 12/03/19  [name], MD  prochlorperazine (COMPAZINE) 10 MG tablet Take 1 tablet (10 mg total) by mouth every 6 (six) hours as needed for nausea or vomiting. Patient not taking: Reported on 09/19/2019 03/08/19 10/15/19  Kyung Rudd, MD  sucralfate (CARAFATE) 1 g tablet Take 1 tablet (1 g total) by mouth 4 (four) times daily -  with meals and at bedtime for 14 days. Patient not taking: Reported  on 11/02/2019 07/23/19 12/03/19  Charlott Rakes, MD    Allergies    Patient has no known allergies.  Review of Systems   Review of Systems  Constitutional: Negative for appetite change, diaphoresis and fever.  Respiratory: Negative for shortness of breath and wheezing.   Cardiovascular: Negative for chest pain and palpitations.  Gastrointestinal: Negative for abdominal pain, blood in stool, constipation, diarrhea, nausea and vomiting.  Genitourinary: Negative for dysuria.  Musculoskeletal: Positive for myalgias. Negative for back pain, gait problem, joint swelling, neck pain and neck stiffness.  Skin: Negative for rash.  Allergic/Immunologic: Negative for immunocompromised state.  Neurological: Positive for seizures. Negative for syncope, weakness, numbness and headaches.       Paresthesias  Psychiatric/Behavioral: Negative for confusion.    Physical Exam Updated Vital Signs BP 121/89 (BP Location: Right Arm)   Pulse 75   Temp 98.9 F (37.2 C) (Oral)   Resp 20   Ht 5\' 9"  (1.753 m)   Wt 91.5 kg   SpO2 99%   BMI 29.79 kg/m   Physical Exam Vitals and nursing note reviewed.  Constitutional:      General:  He is not in acute distress.    Appearance: He is well-developed. He is not ill-appearing, toxic-appearing or diaphoretic.  HENT:     Head: Normocephalic.  Eyes:     Conjunctiva/sclera: Conjunctivae normal.  Cardiovascular:     Rate and Rhythm: Normal rate and regular rhythm.     Heart sounds: No murmur.  Pulmonary:     Effort: Pulmonary effort is normal.  Abdominal:     General: There is no distension.     Palpations: Abdomen is soft. There is no mass.     Tenderness: There is no abdominal tenderness. There is no right CVA tenderness, left CVA tenderness, guarding or rebound.     Hernia: No hernia is present.  Musculoskeletal:     Cervical back: Neck supple.     Right lower leg: No edema.     Left lower leg: No edema.  Skin:    General: Skin is warm and dry.  Neurological:     Mental Status: He is alert.     Comments: Sensation to sharp and light touch is intact and equal throughout the bilateral lower extremities.  He is unable to independently move the digits of his left toes, but states this is baseline.  Independently moves the digits of the right foot.  5-5 strength against resistance of all muscle groups of the bilateral lower extremities.  5/5 strength against resistance of the bilateral upper extremities.  Cranial nerves II through XII are grossly intact.  Able to stand  Psychiatric:        Behavior: Behavior normal.     ED Results / Procedures / Treatments   Labs (all labs ordered are listed, but only abnormal results are displayed) Labs Reviewed  BASIC METABOLIC PANEL - Abnormal; Notable for the following components:      Result Value   Glucose, Bld 134 (*)    All other components within normal limits  CBC    EKG None  Radiology No results found.  Procedures Procedures (including critical care time)  Medications Ordered in ED Medications - No data to display  ED Course  I have reviewed the triage vital signs and the nursing notes.  Pertinent labs &  imaging results that were available during my care of the patient were reviewed by me and considered in my medical decision making (see chart  for details).    MDM Rules/Calculators/A&P                      59 year old male with a history of seizures 2/2 recurrent meningioma (initial surgical resection in 2014 and 2020) who presents the emergency department with concerns for leg pain, paresthesias, and seizure-like activity.  The patient was seen and independently evaluated by Dr. Ashok Cordia, attending physician.  One of the patient's primary concerns is having repeat imaging performed of his brain in the ER today despite having an outpatient appointment with oncology in the morning.  Patient has been observed in the ER for more than 6 hours with no seizure-like activity.  Medical record reviewed.  Patient had MRI brain on April 20 as well as a CT head on April 17.  Most recent MRI showed unchanged postoperative appearance of the brain with gliosis at the right greater than left vertex.  He has since been seen by both neurology and oncology.  Neurologic exam is at the patient's baseline.  No deficits. Labs are reassuring.  There are no electrolyte derangements.  Discussed the patient with Dr. Cheral Marker, neurology.  No further recommendations from neurology at this time and patient can be discharged with outpatient follow-up to neurology.  Patient has been advised to continue all of his home medications.  ER return precautions given.  He is hemodynamically stable and in no acute distress.  Safe for discharge home with outpatient follow-up as indicated.  Final Clinical Impression(s) / ED Diagnoses Final diagnoses:  Seizures ([place])  Paresthesia of bilateral legs    Rx / DC Orders ED Discharge Orders    None       Joanne Gavel, PA-C 12/03/19 1158    Lajean Saver, MD 12/03/19 1652

## 2019-12-02 NOTE — Telephone Encounter (Signed)
Patient stopped by the office and requested for a referral to be sent to [facility] [address], [place], [place] [address]. Please follow up at your earliest convenience.

## 2019-12-03 ENCOUNTER — Other Ambulatory Visit: Payer: Self-pay

## 2019-12-03 ENCOUNTER — Inpatient Hospital Stay: Payer: Medicaid Other | Attending: Internal Medicine | Admitting: Internal Medicine

## 2019-12-03 ENCOUNTER — Inpatient Hospital Stay: Payer: Medicaid Other

## 2019-12-03 VITALS — BP 132/89 | HR 85 | Temp 98.5°F | Resp 18 | Ht 69.0 in | Wt 203.8 lb

## 2019-12-03 DIAGNOSIS — R569 Unspecified convulsions: Secondary | ICD-10-CM

## 2019-12-03 DIAGNOSIS — Z79899 Other long term (current) drug therapy: Secondary | ICD-10-CM | POA: Diagnosis not present

## 2019-12-03 DIAGNOSIS — D320 Benign neoplasm of cerebral meninges: Secondary | ICD-10-CM

## 2019-12-03 DIAGNOSIS — G40109 Localization-related (focal) (partial) symptomatic epilepsy and epileptic syndromes with simple partial seizures, not intractable, without status epilepticus: Secondary | ICD-10-CM | POA: Diagnosis not present

## 2019-12-03 DIAGNOSIS — Z87891 Personal history of nicotine dependence: Secondary | ICD-10-CM | POA: Diagnosis not present

## 2019-12-03 DIAGNOSIS — G629 Polyneuropathy, unspecified: Secondary | ICD-10-CM | POA: Diagnosis not present

## 2019-12-03 LAB — PHENYTOIN LEVEL, TOTAL: Phenytoin Lvl: 27.1 ug/mL — ABNORMAL HIGH (ref 10.0–20.0)

## 2019-12-03 MED ORDER — LORAZEPAM 1 MG PO TABS: 1.0000 mg | ORAL_TABLET | Freq: Three times a day (TID) | ORAL | 0 refills | Status: DC | PRN

## 2019-12-03 MED ORDER — GABAPENTIN 300 MG PO CAPS: 300.0000 mg | ORAL_CAPSULE | Freq: Two times a day (BID) | ORAL | 3 refills | Status: DC

## 2019-12-03 NOTE — Discharge Instructions (Addendum)
Thank you for allowing me to care for you today in the Emergency Department.   Continue your home medication as prescribed.  Keep your appointment with your oncologist tomorrow morning.  Call your neurologist to schedule a follow-up appointment.  Return to the emergency department if you have continued, frequent seizures despite taking your home indication, if your toes turn blue, if you become unable to walk, new numbness, slurred speech, or other new, concerning symptoms.

## 2019-12-03 NOTE — Progress Notes (Signed)
[facility] [place], [place] [address] [phone]   Interval Evaluation  Date of Service: 12/03/19 Patient Name: Joshua [name] Patient MRN: [id number] Patient DOB: [birth year] Provider: [name], MD  Identifying Statement:  [name] is a 59 y.o. 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 Vertell Limber (WHO I) 01/10/19: Repeat craniotomy, debulking resection by Dr. Vertell Limber after tumor recurrence, seizures.  Path demonstrates islands of anaplasia c/w grade II/III. 04/05/19: Completes post-operative IMRT with Dr. Lisbeth Renshaw  Interval History:  Joshua [name] presents today for follow up after clinical changes.  He describes relatively new onset of burning and tingling pain in his feet and lower legs, up to his knees.  He has been dosing ativan more frequently, thinking this is a seizure or seizure precursor.  No involvement of his hands at this time.  Describes 3 seizures over the past month, he maintains compliance with Dilantin, Lamictal, Klonopin. He describes continued numbness of the left leg, although he has improved with recent physical therapy.   H+P (02/05/19) Patient presents to review clinical course and care for his meningioma.  Initially he presented in 2014 with headache syndrome, was found to have tumor on imaging which was resected by Dr. Vertell Limber.  The patient was lost to follow up for ~5 years until he developed seizures, described as "twitching of left leg spreading to arm" this past month.  He required loads of Keppra and Dilantin to break events.  MRI demonstrated significant regrowth of the mass, and repeat craniotomy was performed on 01/10/19.  Small amount of residual tumor was visible on post-operative MRI.  Since surgery he has continued to experience numbness and clumsiness of his left leg, requiring cane for  ambulation.  He is still pending home physical therapy evaluation.  He has had "small" seizures, consisting of few seconds of shaking of left leg, occurring less than daily.  Continues on Keppra and Dilantin.    Medications: Current Outpatient Medications on File Prior to Visit  Medication Sig Dispense Refill  . aspirin EC 81 MG EC tablet Take 1 tablet (81 mg total) by mouth daily. 30 tablet 3  . clonazePAM (KLONOPIN) 0.5 MG tablet Take 1 tablet (0.5 mg total) by mouth 2 (two) times daily. 60 tablet 1  . FIBER ADULT GUMMIES PO Take 2 tablets by mouth daily.     Marland Kitchen lamoTRIgine (LAMICTAL) 100 MG tablet Take 2 tablets (200 mg total) by mouth 2 (two) times daily. 120 tablet 2  . LORazepam (ATIVAN) 1 MG tablet Take 1 tablet (1 mg total) by mouth every 8 (eight) hours as needed for seizure (for breakthrough seizures). 30 tablet 0  . ondansetron (ZOFRAN-ODT) 4 MG disintegrating tablet DISSOLVE 1 TABLET(4 MG) ON THE TONGUE EVERY 8 HOURS AS NEEDED FOR NAUSEA OR VOMITING (Patient taking differently: Take 4 mg by mouth every 8 (eight) hours as needed for nausea or vomiting. ) 30 tablet 0  . phenytoin (DILANTIN) 200 MG ER capsule Take 1 capsule (200 mg total) by mouth 2 (two) times daily. 60 capsule 3  . vitamin C (ASCORBIC ACID) 500 MG tablet Take 500 mg by mouth daily.    Marland Kitchen atorvastatin (LIPITOR) 20 MG tablet Take 1 tablet (20 mg total) by mouth daily. (Patient not taking: Reported on 12/03/2019) 30 tablet 3  . cyclobenzaprine (FLEXERIL) 10 MG tablet Take 1 tablet (10 mg total)  by mouth 2 (two) times daily as needed for muscle spasms. (Patient not taking: Reported on 12/03/2019) 60 tablet 3  . ibuprofen (ADVIL) 600 MG tablet Take 600 mg by mouth every 6 (six) hours as needed.    . meloxicam (MOBIC) 7.5 MG tablet Take 1 tablet (7.5 mg total) by mouth daily. (Patient not taking: Reported on 12/03/2019) 30 tablet 1  . [DISCONTINUED] cetirizine (ZYRTEC) 10 MG tablet Take 1 tablet (10 mg total) by mouth daily. 30  tablet 11  . [DISCONTINUED] furosemide (LASIX) 20 MG tablet Take 1 tablet (20 mg total) by mouth daily. Prn swelling.  Use sparingly (Patient not taking: Reported on 10/24/2019) 30 tablet 1  . [DISCONTINUED] mirtazapine (REMERON) 15 MG tablet Take 1 tablet (15 mg total) by mouth at bedtime. (Patient not taking: Reported on 12/02/2019) 30 tablet 3  . [DISCONTINUED] prochlorperazine (COMPAZINE) 10 MG tablet Take 1 tablet (10 mg total) by mouth every 6 (six) hours as needed for nausea or vomiting. (Patient not taking: Reported on 09/19/2019) 30 tablet 1  . [DISCONTINUED] sucralfate (CARAFATE) 1 g tablet Take 1 tablet (1 g total) by mouth 4 (four) times daily -  with meals and at bedtime for 14 days. (Patient not taking: Reported on 11/02/2019) 56 tablet 0   No current facility-administered medications on file prior to visit.    Allergies: No Known Allergies Past Medical History:  Past Medical History:  Diagnosis Date  . Brain tumor ([place]) 02/20/2013   brain tumor removed in March 2014, Dr Donald Pore  . Dizziness   . Enlarged prostate   . Headache(784.0)    scattered  . Meningioma ([place])   . Seizures ([place])    11/27/19 last sz 1 wk ago   Past Surgical History:  Past Surgical History:  Procedure Laterality Date  . APPLICATION OF CRANIAL NAVIGATION N/A 01/10/2019   Procedure: APPLICATION OF CRANIAL NAVIGATION;  Surgeon: Erline Levine, MD;  Location: [facility];  Service: Neurosurgery;  Laterality: N/A;  . CATARACT EXTRACTION Right   . CRANIOTOMY Right 11/06/2012   Procedure: CRANIOTOMY TUMOR EXCISION;  Surgeon: Erline Levine, MD;  Location: [facility];  Service: Neurosurgery;  Laterality: Right;  Right Parasagittal craniotomy for meningioma with Stealth  . CRANIOTOMY Right 01/10/2019   Procedure: Right Parasagittal Craniotomy for Tumor;  Surgeon: Erline Levine, MD;  Location: [facility];  Service: Neurosurgery;  Laterality: Right;  Right parasagittal craniotomy for tumor  . ESOPHAGOGASTRODUODENOSCOPY    . right  knee arthroscopy     Social History:  Social History   Socioeconomic History  . Marital status: Married    Spouse name: Not on file  . Number of children: 2  . Years of education: Not on file  . Highest education level: Not on file  Occupational History  . Not on file  Tobacco Use  . Smoking status: Former Smoker    Types: Cigarettes    Quit date: 01/29/2019    Years since quitting: 0.8  . Smokeless tobacco: Never Used  . Tobacco comment: hasn't smoked in a week  Substance and Sexual Activity  . Alcohol use: Yes    Alcohol/week: 8.0 standard drinks    Types: 6 Cans of beer, 2 Shots of liquor per week    Comment: occasionally, 11/27/19 none  . Drug use: No  . Sexual activity: Yes  Other Topics Concern  . Not on file  Social History Narrative   Lives with wife   Caffeine- soda occass   Social Determinants of Health  Financial Resource Strain:   . Difficulty of Paying Living Expenses:   Food Insecurity:   . Worried About Charity fundraiser in the Last Year:   . Arboriculturist in the Last Year:   Transportation Needs: No Transportation Needs  . Lack of Transportation (Medical): No  . Lack of Transportation (Non-Medical): No  Physical Activity:   . Days of Exercise per Week:   . Minutes of Exercise per Session:   Stress:   . Feeling of Stress :   Social Connections:   . Frequency of Communication with Friends and Family:   . Frequency of Social Gatherings with Friends and Family:   . Attends Religious Services:   . Active Member of Clubs or Organizations:   . Attends Archivist Meetings:   Marland Kitchen Marital Status:   Intimate Partner Violence:   . Fear of Current or Ex-Partner:   . Emotionally Abused:   Marland Kitchen Physically Abused:   . Sexually Abused:    Family History:  Family History  Problem Relation Age of Onset  . Hypertension Mother   . Hypertension Father     Review of Systems: Constitutional: Denies fevers, chills or abnormal weight loss Eyes:  Denies blurriness of vision Ears, nose, mouth, throat, and face: Denies mucositis or sore throat Respiratory: Denies cough, dyspnea or wheezes Cardiovascular: Denies palpitation, chest discomfort or lower extremity swelling Gastrointestinal:  Denies nausea, constipation, diarrhea GU: Denies dysuria or incontinence Skin: Denies abnormal skin rashes Neurological: Per HPI Musculoskeletal: Denies joint pain, back or neck discomfort. No decrease in ROM Behavioral/Psych: Denies anxiety, disturbance in thought content, and mood instability  Physical Exam: Vitals:   12/03/19 0858  BP: 132/89  Pulse: 85  Resp: 18  Temp: 98.5 F (36.9 C)  SpO2: 98%   KPS: 80. General: Alert, cooperative, pleasant, in no acute distress Head: Craniotomy scar noted, dry and intact. EENT: No conjunctival injection or scleral icterus. Oral mucosa moist Lungs: Resp effort normal Cardiac: Regular rate and rhythm Abdomen: Soft, non-distended abdomen Skin: No rashes cyanosis or petechiae. Extremities: No clubbing or edema  Neurologic Exam: Mental Status: Awake, alert, attentive to examiner. Oriented to self and environment. Language is fluent with intact comprehension.  Cranial Nerves: Visual acuity is grossly normal. Visual fields are full. Extra-ocular movements intact. No ptosis. Face is symmetric, tongue midline. Motor: Tone and bulk are normal.Power is impaired in left leg, 4+/5 distally. Reflexes are symmetric, no pathologic reflexes present. Impaired heel to shin left leg Sensory: Stocking sensory loss Gait: Independent  Labs: I have reviewed the data as listed    Component Value Date/Time   NA 141 12/02/2019 1944   K 4.3 12/02/2019 1944   CL 104 12/02/2019 1944   CO2 26 12/02/2019 1944   GLUCOSE 134 (H) 12/02/2019 1944   BUN 7 12/02/2019 1944   CREATININE 0.98 12/02/2019 1944   CALCIUM 9.2 12/02/2019 1944   PROT 7.2 11/02/2019 1503   ALBUMIN 4.3 11/02/2019 1503   AST 22 11/02/2019 1503   ALT  33 11/02/2019 1503   ALKPHOS 86 11/02/2019 1503   BILITOT 0.4 11/02/2019 1503   GFRNONAA >60 12/02/2019 1944   GFRAA >60 12/02/2019 1944   Lab Results  Component Value Date   WBC 6.5 12/02/2019   NEUTROABS 2.6 11/02/2019   HGB 15.3 12/02/2019   HCT 45.6 12/02/2019   MCV 91.8 12/02/2019   PLT 245 12/02/2019    Assessment/Plan 1. Meningioma, recurrent of brain ([place])  2. Seizures ([place])  Mr. Dolloff describes clinical changes today which are consistent with symmetric, length dependent small fiber neuropathy. Etiology is unclear, but he does have history of alcohol abuse, and concurrent use of Dilantin which can be neurotoxic.  Today we will check phenytoin levels and also HgbA1c given these clinical concerns.  Will give strong consideration to transitioning to alternate Na+ channel blocker (ie Vimpat) if PHT levels are high, given ongoing breakthrough seizures.  For now, will con't current regimen; LMG 200mg  BID, PHT 200mg  BID, KLP 0.5mg  BID.  For neuropathic pain, will initiate therapy with Gabapentin starting at 300mg  BID.  This can also be considered an adjunct anti-epileptic in this case.  We appreciate the opportunity to participate in the care of BEJAMIN GREENSLADE.  We will give him a call on Thursday to go over lab results.  All questions were answered. The patient knows to call the clinic with any problems, questions or concerns. No barriers to learning were detected.    The total time spent in the encounter was 30 minutes and more than 50% was on counseling and review of test results   [name], MD Medical Director of Neuro-Oncology [facility] [place] 12/03/19 9:05 AM

## 2019-12-03 NOTE — ED Notes (Signed)
Patient verbalizes understanding of discharge instructions. Opportunity for questioning and answers were provided. Armband removed by staff, pt discharged from ED ambulatory w/ wife 

## 2019-12-04 ENCOUNTER — Telehealth: Payer: Self-pay | Admitting: Internal Medicine

## 2019-12-04 LAB — HEMOGLOBIN A1C
Hgb A1c MFr Bld: 6.2 % — ABNORMAL HIGH (ref 4.8–5.6)
Mean Plasma Glucose: 131 mg/dL

## 2019-12-04 NOTE — Telephone Encounter (Signed)
Scheduled appt per 5/18 los.  Spoke with pt and he is aware of his phone visit on 5/20

## 2019-12-05 ENCOUNTER — Inpatient Hospital Stay (HOSPITAL_BASED_OUTPATIENT_CLINIC_OR_DEPARTMENT_OTHER): Payer: Medicaid Other | Admitting: Internal Medicine

## 2019-12-05 ENCOUNTER — Ambulatory Visit: Payer: Medicaid Other | Attending: Family Medicine | Admitting: Family Medicine

## 2019-12-05 ENCOUNTER — Other Ambulatory Visit: Payer: Self-pay

## 2019-12-05 VITALS — BP 131/80 | HR 86 | Ht 69.0 in | Wt 201.2 lb

## 2019-12-05 DIAGNOSIS — G629 Polyneuropathy, unspecified: Secondary | ICD-10-CM | POA: Diagnosis not present

## 2019-12-05 DIAGNOSIS — Z8249 Family history of ischemic heart disease and other diseases of the circulatory system: Secondary | ICD-10-CM | POA: Diagnosis not present

## 2019-12-05 DIAGNOSIS — N400 Enlarged prostate without lower urinary tract symptoms: Secondary | ICD-10-CM | POA: Diagnosis not present

## 2019-12-05 DIAGNOSIS — Z86011 Personal history of benign neoplasm of the brain: Secondary | ICD-10-CM | POA: Insufficient documentation

## 2019-12-05 DIAGNOSIS — Z791 Long term (current) use of non-steroidal anti-inflammatories (NSAID): Secondary | ICD-10-CM | POA: Diagnosis not present

## 2019-12-05 DIAGNOSIS — R7303 Prediabetes: Secondary | ICD-10-CM | POA: Diagnosis not present

## 2019-12-05 DIAGNOSIS — Z7982 Long term (current) use of aspirin: Secondary | ICD-10-CM | POA: Diagnosis not present

## 2019-12-05 DIAGNOSIS — R569 Unspecified convulsions: Secondary | ICD-10-CM | POA: Insufficient documentation

## 2019-12-05 DIAGNOSIS — R2241 Localized swelling, mass and lump, right lower limb: Secondary | ICD-10-CM | POA: Insufficient documentation

## 2019-12-05 DIAGNOSIS — Z79899 Other long term (current) drug therapy: Secondary | ICD-10-CM | POA: Diagnosis not present

## 2019-12-05 DIAGNOSIS — I700 Atherosclerosis of aorta: Secondary | ICD-10-CM | POA: Insufficient documentation

## 2019-12-05 HISTORY — DX: Polyneuropathy, unspecified: G62.9

## 2019-12-05 MED ORDER — LACOSAMIDE 100 MG PO TABS: 100.0000 mg | ORAL_TABLET | Freq: Two times a day (BID) | ORAL | 3 refills | Status: DC

## 2019-12-05 NOTE — Patient Instructions (Signed)

## 2019-12-05 NOTE — Progress Notes (Signed)
I connected with Joshua [name] on 12/05/19 at  9:00 AM EDT by telephone visit and verified that I am speaking with the correct person using two identifiers.  I discussed the limitations, risks, security and privacy concerns of performing an evaluation and management service by telemedicine and the availability of in-person appointments. I also discussed with the patient that there may be a patient responsible charge related to this service. The patient expressed understanding and agreed to proceed.  Other persons participating in the visit and their role in the encounter:  N/a  Patient's location:  Home  Provider's location:  Office  Chief Complaint:  Seizures ([place])  Neuropathy   History of Present Ilness: [name] describes no recurrent seizures, no clinical changes from visit last week.  Neuropathy has improved modestly with Gabapentin. Observations: Language and cognition stable Assessment and Plan: Seizures ([place])  Neuropathy  Due to supratherapeutic Dilantin level (>20), new onset neuropathy possibly related to PHT, and failure to completely control seizure events (3-4 per month currently) recommended transitioning off of PHT and onto Na+ channel blocker Vimpat 100mg  BID.  This had been discussed previously last week.  Follow Up Instructions: RTC in 2 months or sooner if needed for additional events.  I discussed the assessment and treatment plan with the patient.  The patient was provided an opportunity to ask questions and all were answered.  The patient agreed with the plan and demonstrated understanding of the instructions.    The patient was advised to call back or seek an in-person evaluation if the symptoms worsen or if the condition fails to improve as anticipated.  I provided 5-10 minutes of non-face-to-face time during this enocunter.  [name], MD   I provided 10 minutes of non face-to-face telephone visit time during this encounter, and > 50% was spent  counseling as documented under my assessment & plan.

## 2019-12-05 NOTE — Progress Notes (Signed)
Patient states that he has a knot in his right leg.

## 2019-12-05 NOTE — Progress Notes (Signed)
Subjective:  Patient ID: Joshua [name], male    DOB: Jun 27, 1961  Age: 59 y.o. MRN: [id number]  CC:  Chief Complaint  Patient presents with  . Leg Swelling     HPI Joshua [name] is a43 year old right-handed male male with a history of meningioma status post resection in 2014 hospitalized at [facility] from 01/03/2019 through 01/14/2019 for recurrent meningioma status post resection(the last of which was in 12/2018), seizureshere foran acute visit.  He noticed a knot on his R leg but states it had been present for a while and he would like it checked out as "it is on a vein".  He is concerned because his cousin had problems with the veins of his legs and passed away shortly after an ED visit. He denies pedal edema, erythema. His bilateral lower extremity Doppler from 10/2019 was negative for DVT.  Past Medical History:  Diagnosis Date  . Brain tumor ([place]) 02/20/2013   brain tumor removed in March 2014, Dr Donald Pore  . Dizziness   . Enlarged prostate   . Headache(784.0)    scattered  . Meningioma ([place])   . Seizures ([place])    11/27/19 last sz 1 wk ago    Past Surgical History:  Procedure Laterality Date  . APPLICATION OF CRANIAL NAVIGATION N/A 01/10/2019   Procedure: APPLICATION OF CRANIAL NAVIGATION;  Surgeon: Erline Levine, MD;  Location: [facility];  Service: Neurosurgery;  Laterality: N/A;  . CATARACT EXTRACTION Right   . CRANIOTOMY Right 11/06/2012   Procedure: CRANIOTOMY TUMOR EXCISION;  Surgeon: Erline Levine, MD;  Location: [facility] NEUR[facility] ORS;  Service: Neurosurgery;  Laterality: Right;  Right Parasagittal craniotomy for meningioma with Stealth  . CRANIOTOMY Right 01/10/2019   Procedure: Right Parasagittal Craniotomy for Tumor;  Surgeon: Erline Levine, MD;  Location: [facility];  Service: Neurosurgery;  Laterality: Right;  Right parasagittal craniotomy for tumor  . ESOPHAGOGASTRODUODENOSCOPY    . right knee arthroscopy      Family History  Problem Relation Age of Onset  .  Hypertension Mother   . Hypertension Father     No Known Allergies  Outpatient Medications Prior to Visit  Medication Sig Dispense Refill  . aspirin EC 81 MG EC tablet Take 1 tablet (81 mg total) by mouth daily. 30 tablet 3  . atorvastatin (LIPITOR) 20 MG tablet Take 1 tablet (20 mg total) by mouth daily. 30 tablet 3  . clonazePAM (KLONOPIN) 0.5 MG tablet Take 1 tablet (0.5 mg total) by mouth 2 (two) times daily. 60 tablet 1  . FIBER ADULT GUMMIES PO Take 2 tablets by mouth daily.     Marland Kitchen gabapentin (NEURONTIN) 300 MG capsule Take 1 capsule (300 mg total) by mouth 2 (two) times daily. 60 capsule 3  . ibuprofen (ADVIL) 600 MG tablet Take 600 mg by mouth every 6 (six) hours as needed.    . lamoTRIgine (LAMICTAL) 100 MG tablet Take 2 tablets (200 mg total) by mouth 2 (two) times daily. 120 tablet 2  . LORazepam (ATIVAN) 1 MG tablet Take 1 tablet (1 mg total) by mouth every 8 (eight) hours as needed for seizure (for breakthrough seizures). 10 tablet 0  . ondansetron (ZOFRAN-ODT) 4 MG disintegrating tablet DISSOLVE 1 TABLET(4 MG) ON THE TONGUE EVERY 8 HOURS AS NEEDED FOR NAUSEA OR VOMITING (Patient taking differently: Take 4 mg by mouth every 8 (eight) hours as needed for nausea or vomiting. ) 30 tablet 0  . phenytoin (DILANTIN) 200 MG ER capsule Take 1 capsule (  200 mg total) by mouth 2 (two) times daily. 60 capsule 3  . vitamin C (ASCORBIC ACID) 500 MG tablet Take 500 mg by mouth daily.    . cyclobenzaprine (FLEXERIL) 10 MG tablet Take 1 tablet (10 mg total) by mouth 2 (two) times daily as needed for muscle spasms. (Patient not taking: Reported on 12/03/2019) 60 tablet 3  . meloxicam (MOBIC) 7.5 MG tablet Take 1 tablet (7.5 mg total) by mouth daily. (Patient not taking: Reported on 12/03/2019) 30 tablet 1   No facility-administered medications prior to visit.     ROS Review of Systems  Constitutional: Negative for activity change and appetite change.  HENT: Negative for sinus pressure and sore  throat.   Eyes: Negative for visual disturbance.  Respiratory: Negative for cough, chest tightness and shortness of breath.   Cardiovascular: Negative for chest pain and leg swelling.  Gastrointestinal: Negative for abdominal distention, abdominal pain, constipation and diarrhea.  Endocrine: Negative.   Genitourinary: Negative for dysuria.  Musculoskeletal:       See HPI  Skin: Negative for rash.  Allergic/Immunologic: Negative.   Neurological: Negative for weakness, light-headedness and numbness.  Psychiatric/Behavioral: Negative for dysphoric mood and suicidal ideas.    Objective:  BP 131/80   Pulse 86   Ht 5\' 9"  (1.753 m)   Wt 201 lb 3.2 oz (91.3 kg)   SpO2 96%   BMI 29.71 kg/m   BP/Weight 12/05/2019 12/03/2019 0000000  Systolic BP [id number]  Diastolic BP 80 89 89  Wt. (Lbs) 201.2 203.8 -  BMI 29.71 30.1 -      Physical Exam Constitutional:      Appearance: He is well-developed.  Neck:     Vascular: No JVD.  Cardiovascular:     Rate and Rhythm: Normal rate.     Heart sounds: Normal heart sounds. No murmur.  Pulmonary:     Effort: Pulmonary effort is normal.     Breath sounds: Normal breath sounds. No wheezing or rales.  Chest:     Chest wall: No tenderness.  Abdominal:     General: Bowel sounds are normal. There is no distension.     Palpations: Abdomen is soft. There is no mass.     Tenderness: There is no abdominal tenderness.  Musculoskeletal:        General: Normal range of motion.     Right lower leg: No edema.     Left lower leg: No edema.     Comments: Superficial nontender immobile mass on medial aspect of right canal. Negative Homans' sign  Neurological:     Mental Status: He is alert and oriented to person, place, and time.  Psychiatric:        Mood and Affect: Mood normal.     CMP Latest Ref Rng & Units 12/02/2019 11/05/2019 11/02/2019  Glucose 70 - 99 mg/dL 134(H) 117(H) 108(H)  BUN 6 - 20 mg/dL 7 11 12   Creatinine 0.61 - 1.24 mg/dL 0.98  0.75 0.80  Sodium 135 - 145 mmol/L 141 142 141  Potassium 3.5 - 5.1 mmol/L 4.3 4.3 4.7  Chloride 98 - 111 mmol/L 104 107 102  CO2 22 - 32 mmol/L 26 25 -  Calcium 8.9 - 10.3 mg/dL 9.2 9.3 -  Total Protein 6.5 - 8.1 g/dL - - -  Total Bilirubin 0.3 - 1.2 mg/dL - - -  Alkaline Phos 38 - 126 U/L - - -  AST 15 - 41 U/L - - -  ALT 0 -  44 U/L - - -    Lipid Panel  No results found for: CHOL, TRIG, HDL, CHOLHDL, VLDL, LDLCALC, LDLDIRECT  CBC    Component Value Date/Time   WBC 6.5 12/02/2019 1944   RBC 4.97 12/02/2019 1944   HGB 15.3 12/02/2019 1944   HCT 45.6 12/02/2019 1944   PLT 245 12/02/2019 1944   MCV 91.8 12/02/2019 1944   MCH 30.8 12/02/2019 1944   MCHC 33.6 12/02/2019 1944   RDW 12.6 12/02/2019 1944   LYMPHSABS 2.1 11/02/2019 1503   MONOABS 0.6 11/02/2019 1503   EOSABS 0.2 11/02/2019 1503   BASOSABS 0.1 11/02/2019 1503    Lab Results  Component Value Date   HGBA1C 6.2 (H) 12/03/2019     Assessment & Plan:  1. Prediabetes A1c of 6.7 Provided education on lifestyle modifications to prevent progression to diabetes mellitus  2. Aortic atherosclerosis (HCC) Previously prescribed atorvastatin which he is yet to commence Advised to bring the pharmacy - Lipid panel; Future  3. Leg mass, right Low suspicion for DVT We will need ultrasound to evaluate soft tissue mass - [place] RT LOWER EXTREM LTD SOFT TISSUE NON VASCULAR; Future      Charlott Rakes, MD, FAAFP. [facility], [place]   12/05/2019, 8:55 AM

## 2019-12-06 ENCOUNTER — Ambulatory Visit: Payer: Medicaid Other

## 2019-12-06 ENCOUNTER — Encounter (HOSPITAL_COMMUNITY): Payer: Self-pay

## 2019-12-06 ENCOUNTER — Telehealth: Payer: Self-pay | Admitting: Internal Medicine

## 2019-12-06 ENCOUNTER — Ambulatory Visit (HOSPITAL_COMMUNITY)
Admission: EM | Admit: 2019-12-06 | Discharge: 2019-12-06 | Disposition: A | Discharge status: Home / Self Care | Payer: Medicaid Other | Attending: Family Medicine | Admitting: Family Medicine

## 2019-12-06 ENCOUNTER — Other Ambulatory Visit: Payer: Self-pay

## 2019-12-06 DIAGNOSIS — K219 Gastro-esophageal reflux disease without esophagitis: Secondary | ICD-10-CM | POA: Diagnosis not present

## 2019-12-06 MED ORDER — OMEPRAZOLE 20 MG PO CPDR: 20.0000 mg | DELAYED_RELEASE_CAPSULE | Freq: Every day | ORAL | 1 refills | Status: DC

## 2019-12-06 NOTE — Discharge Instructions (Addendum)
I believe your symptoms are associated with acid reflux.  We are going to try omeprazole 20 mg once daily  Make sure that you take the omeprazole 30- 60 minutes prior to a meal with a glass of water.  Avoid spicy, greasy foods, caffeine, chocolate and milk products.  No eating 2-3 hours before bedtime. Elevate the head of the bed 30 degrees.  Try this for a few weeks to see if this improves your symptoms.  If you don't see any improvement or your symptoms worsen please follow up with a GI

## 2019-12-06 NOTE — ED Provider Notes (Signed)
[facility]    CSN: [id number] Arrival date & time: 12/06/19  1301      History   Chief Complaint Chief Complaint  Patient presents with  . Heartburn    HPI Joshua [name] is a 59 y.o. male.   Patient is a 59 year old male with a history of a brain tumor, dizziness, seizures, and neuropathy. Presents with complaint of ongoing acid reflux for several months. Started sucralfate with minimal relief of symptoms. Pain has not worsened but has not significantly improved. Describes pain as a discomfort aggravated by lying down at night, especially after eating before bed. Reports occasional phlegm-like reflux. He has made some dietary changes, including reducing sugar and soda intake, after being diagnosed as prediabetic by his PCP. States this has helped alleviate some symptoms. He denies abdominal pain, chest pain, nausea, vomiting, diarrhea, constipation, difficulty breathing, mouth ulcerations.   ROS per HPI      Past Medical History:  Diagnosis Date  . Brain tumor ([place]) 02/20/2013   brain tumor removed in March 2014, Dr Donald Pore  . Diffuse idiopathic skeletal hyperostosis 06/05/2013  . Dizziness   . Enlarged prostate   . Headache(784.0)    scattered  . 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[place]) 03/07/2019  . Meningioma ([place])   . Meningioma, recurrent of brain ([place]) 01/31/2019  . Neuropathy 12/05/2019  . Seizures ([place])    11/27/19 last sz 1 wk ago    Patient Active Problem List   Diagnosis Date Noted  . Neuropathy 12/05/2019  . 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 Seizures ([place]) 01/31/2019  . Meningioma, recurrent of brain ([place]) 01/31/2019  . Meningioma ([place]) 01/03/2019  . Brain tumor ([place]) 12/27/2018  . Diffuse idiopathic skeletal hyperostosis 06/05/2013    Past Surgical History:  Procedure Laterality Date  . APPLICATION OF CRANIAL NAVIGATION N/A 01/10/2019   Procedure: APPLICATION OF CRANIAL NAVIGATION;  Surgeon: Erline Levine, MD;  Location: [facility];  Service: Neurosurgery;  Laterality: N/A;  . CATARACT EXTRACTION Right   . CRANIOTOMY Right 11/06/2012   Procedure: CRANIOTOMY TUMOR EXCISION;  Surgeon: Erline Levine, MD;  Location: [facility];  Service: Neurosurgery;  Laterality: Right;  Right Parasagittal craniotomy for meningioma with [facility]  . CRANIOTOMY Right 01/10/2019   Procedure: Right Parasagittal Craniotomy for Tumor;  Surgeon: Erline Levine, MD;  Location: [facility];  Service: Neurosurgery;  Laterality: Right;  Right parasagittal craniotomy for tumor  . ESOPHAGOGASTRODUODENOSCOPY    . right knee arthroscopy         Home Medications    Prior to Admission medications   Medication Sig Start Date End Date Taking? Authorizing Provider  aspirin EC 81 MG EC tablet Take 1 tablet (81 mg total) by mouth daily. 01/14/19  Yes Erline Levine, MD  atorvastatin (LIPITOR) 20 MG tablet Take 1 tablet (20 mg total) by mouth daily. 11/26/19  Yes Charlott Rakes, MD  clonazePAM (KLONOPIN) 0.5 MG tablet Take 1 tablet (0.5 mg total) by mouth 2 (two) times daily. 10/14/19  Yes Vaslow, Acey Lav, MD  cyclobenzaprine (FLEXERIL) 10 MG tablet Take 1 tablet (10 mg total) by mouth 2 (two) times daily as needed for muscle spasms. 10/24/19  Yes Charlott Rakes, MD  gabapentin (NEURONTIN) 300 MG capsule Take 1 capsule (300 mg total) by mouth 2 (two) times daily. 12/03/19  Yes Vaslow, Acey Lav, MD  Lacosamide 100 MG TABS Take 1 tablet (100 mg total) by mouth in the morning and at bedtime. 12/05/19  Yes Vaslow, Acey Lav, MD  lamoTRIgine (LAMICTAL) 100 MG tablet Take 2 tablets (200 mg total) by mouth 2 (two) times daily. 10/31/19  Yes Vaslow, Acey Lav, MD  LORazepam (ATIVAN) 1 MG tablet Take 1 tablet (1 mg total) by mouth every 8 (eight) hours as needed for seizure (for breakthrough seizures). 12/03/19  Yes Vaslow, Acey Lav, MD  FIBER ADULT GUMMIES PO Take 2 tablets by mouth daily.     [provider]  ibuprofen (ADVIL) 600 MG tablet  Take 600 mg by mouth every 6 (six) hours as needed.    [provider]  omeprazole (PRILOSEC) 20 MG capsule Take 1 capsule (20 mg total) by mouth daily. 12/06/19   Jewel Venditto, Tressia Miners A, NP  ondansetron (ZOFRAN-ODT) 4 MG disintegrating tablet DISSOLVE 1 TABLET(4 MG) ON THE TONGUE EVERY 8 HOURS AS NEEDED FOR NAUSEA OR VOMITING Patient taking differently: Take 4 mg by mouth every 8 (eight) hours as needed for nausea or vomiting.  11/18/19   Vaslow, Acey Lav, MD  vitamin C (ASCORBIC ACID) 500 MG tablet Take 500 mg by mouth daily.    [provider]  cetirizine (ZYRTEC) 10 MG tablet Take 1 tablet (10 mg total) by mouth daily. 08/08/19 09/08/19  Argentina Donovan, PA-C  furosemide (LASIX) 20 MG tablet Take 1 tablet (20 mg total) by mouth daily. Prn swelling.  Use sparingly Patient not taking: Reported on 10/24/2019 08/22/19 12/03/19  Argentina Donovan, PA-C  mirtazapine (REMERON) 15 MG tablet Take 1 tablet (15 mg total) by mouth at bedtime. Patient not taking: Reported on 12/02/2019 10/11/19 12/03/19  [name], MD  prochlorperazine (COMPAZINE) 10 MG tablet Take 1 tablet (10 mg total) by mouth every 6 (six) hours as needed for nausea or vomiting. Patient not taking: Reported on 09/19/2019 03/08/19 10/15/19  Kyung Rudd, MD  sucralfate (CARAFATE) 1 g tablet Take 1 tablet (1 g total) by mouth 4 (four) times daily -  with meals and at bedtime for 14 days. Patient not taking: Reported on 11/02/2019 07/23/19 12/03/19  Charlott Rakes, MD    Family History Family History  Problem Relation Age of Onset  . Hypertension Mother   . Hypertension Father     Social History Social History   Tobacco Use  . Smoking status: Former Smoker    Types: Cigarettes    Quit date: 01/29/2019    Years since quitting: 0.8  . Smokeless tobacco: Never Used  . Tobacco comment: hasn't smoked in a week  Substance Use Topics  . Alcohol use: Yes    Alcohol/week: 8.0 standard drinks    Types: 6 Cans of beer, 2 Shots of  liquor per week    Comment: occasionally, 11/27/19 none  . Drug use: No     Allergies   Patient has no known allergies.   Review of Systems Review of Systems   Physical Exam Triage Vital Signs ED Triage Vitals [12/06/19 1500]  Enc Vitals Group     BP      Pulse      Resp      Temp      Temp src      SpO2      Weight      Height      Head Circumference      Peak Flow      Pain Score 0     Pain Loc      Pain Edu?      Excl. in [place]?  No data found.  Updated Vital Signs There were no vitals taken for this visit.  Visual Acuity Right Eye Distance:   Left Eye Distance:   Bilateral Distance:    Right Eye Near:   Left Eye Near:    Bilateral Near:     Physical Exam Vitals and nursing note reviewed.  Constitutional:      General: He is not in acute distress.    Appearance: Normal appearance. He is not ill-appearing, toxic-appearing or diaphoretic.  HENT:     Head: Normocephalic and atraumatic.     Nose: Nose normal.  Eyes:     Conjunctiva/sclera: Conjunctivae normal.  Cardiovascular:     Rate and Rhythm: Normal rate and regular rhythm.  Pulmonary:     Effort: Pulmonary effort is normal.     Breath sounds: Normal breath sounds.  Abdominal:     Palpations: Abdomen is soft.     Tenderness: There is no abdominal tenderness.  Musculoskeletal:        General: Normal range of motion.     Cervical back: Normal range of motion.  Skin:    General: Skin is warm and dry.  Neurological:     Mental Status: He is alert.  Psychiatric:        Mood and Affect: Mood normal.      UC Treatments / Results  Labs (all labs ordered are listed, but only abnormal results are displayed) Labs Reviewed - No data to display  EKG   Radiology No results found.  Procedures Procedures (including critical care time)  Medications Ordered in UC Medications - No data to display  Initial Impression / Assessment and Plan / UC Course  I have reviewed the triage vital signs  and the nursing notes.  Pertinent labs & imaging results that were available during my care of the patient were reviewed by me and considered in my medical decision making (see chart for details).     GERD As well as some reflux Starting on omeprazole daily Diet precautions given Patient currently asymptomatic. Recommend follow-up with GI for any continued or worsening problems  Final Clinical Impressions(s) / UC Diagnoses   Final diagnoses:  Gastroesophageal reflux disease without esophagitis     Discharge Instructions     I believe your symptoms are associated with acid reflux.  We are going to try omeprazole 20 mg once daily  Make sure that you take the omeprazole 30- 60 minutes prior to a meal with a glass of water.  Avoid spicy, greasy foods, caffeine, chocolate and milk products.  No eating 2-3 hours before bedtime. Elevate the head of the bed 30 degrees.  Try this for a few weeks to see if this improves your symptoms.  If you don't see any improvement or your symptoms worsen please follow up with a GI      ED Prescriptions    Medication Sig Dispense Auth. Provider   omeprazole (PRILOSEC) 20 MG capsule Take 1 capsule (20 mg total) by mouth daily. 30 capsule Tychelle Purkey A, NP     PDMP not reviewed this encounter.   Loura Halt A, NP 12/09/19 [phone]

## 2019-12-06 NOTE — Telephone Encounter (Signed)
Scheduled appt per 5/20 los.  Spoke with pt and they are aware of the appt date and time. 

## 2019-12-06 NOTE — ED Triage Notes (Signed)
Pt c/o difficulty swallowing and points to mid-chest area; also c/o indigestion acid reflux of stomach acid in back of mouth when lying down. States Rx of sucralfate did not improve symptoms. Denies abdom pain.  Not taking any anti-reflux medications.

## 2019-12-09 ENCOUNTER — Telehealth: Payer: Self-pay | Admitting: Family Medicine

## 2019-12-09 NOTE — Telephone Encounter (Signed)
Patient called saying he had labs done by Dr. Mickeal Skinner and was informed that he was border line diabetic. Patient went to his pharmacy to buy a meter and was told to call his PCP to get an Rx so he could get it for free. If an Rx is written patient would like Rx sent to [facility] on [address].

## 2019-12-10 ENCOUNTER — Emergency Department (HOSPITAL_COMMUNITY): Payer: Medicaid Other

## 2019-12-10 ENCOUNTER — Telehealth: Payer: Self-pay | Admitting: Radiology

## 2019-12-10 ENCOUNTER — Ambulatory Visit (INDEPENDENT_AMBULATORY_CARE_PROVIDER_SITE_OTHER): Payer: Medicaid Other | Admitting: Cardiology

## 2019-12-10 ENCOUNTER — Emergency Department (HOSPITAL_COMMUNITY)
Admission: EM | Admit: 2019-12-10 | Discharge: 2019-12-10 | Disposition: A | Discharge status: Home / Self Care | Payer: Medicaid Other | Attending: Emergency Medicine | Admitting: Emergency Medicine

## 2019-12-10 ENCOUNTER — Other Ambulatory Visit: Payer: Self-pay

## 2019-12-10 ENCOUNTER — Encounter: Payer: Self-pay | Admitting: Cardiology

## 2019-12-10 VITALS — BP 130/82 | HR 98 | Ht 69.0 in | Wt 200.0 lb

## 2019-12-10 DIAGNOSIS — Z85841 Personal history of malignant neoplasm of brain: Secondary | ICD-10-CM | POA: Insufficient documentation

## 2019-12-10 DIAGNOSIS — R29818 Other symptoms and signs involving the nervous system: Secondary | ICD-10-CM | POA: Diagnosis not present

## 2019-12-10 DIAGNOSIS — Z7982 Long term (current) use of aspirin: Secondary | ICD-10-CM | POA: Insufficient documentation

## 2019-12-10 DIAGNOSIS — R000 Tachycardia, unspecified: Secondary | ICD-10-CM | POA: Diagnosis not present

## 2019-12-10 DIAGNOSIS — R569 Unspecified convulsions: Secondary | ICD-10-CM | POA: Diagnosis not present

## 2019-12-10 DIAGNOSIS — E7800 Pure hypercholesterolemia, unspecified: Secondary | ICD-10-CM

## 2019-12-10 DIAGNOSIS — R410 Disorientation, unspecified: Secondary | ICD-10-CM | POA: Diagnosis not present

## 2019-12-10 DIAGNOSIS — I700 Atherosclerosis of aorta: Secondary | ICD-10-CM | POA: Diagnosis not present

## 2019-12-10 DIAGNOSIS — Z79899 Other long term (current) drug therapy: Secondary | ICD-10-CM | POA: Insufficient documentation

## 2019-12-10 DIAGNOSIS — Z8249 Family history of ischemic heart disease and other diseases of the circulatory system: Secondary | ICD-10-CM

## 2019-12-10 DIAGNOSIS — Z87891 Personal history of nicotine dependence: Secondary | ICD-10-CM | POA: Diagnosis not present

## 2019-12-10 DIAGNOSIS — R200 Anesthesia of skin: Secondary | ICD-10-CM | POA: Diagnosis not present

## 2019-12-10 DIAGNOSIS — R002 Palpitations: Secondary | ICD-10-CM

## 2019-12-10 DIAGNOSIS — G40909 Epilepsy, unspecified, not intractable, without status epilepticus: Secondary | ICD-10-CM | POA: Diagnosis not present

## 2019-12-10 LAB — DIFFERENTIAL
Abs Immature Granulocytes: 0.01 10*3/uL (ref 0.00–0.07)
Basophils Absolute: 0.1 10*3/uL (ref 0.0–0.1)
Basophils Relative: 1 %
Eosinophils Absolute: 0.1 10*3/uL (ref 0.0–0.5)
Eosinophils Relative: 2 %
Immature Granulocytes: 0 %
Lymphocytes Relative: 31 %
Lymphs Abs: 2.3 10*3/uL (ref 0.7–4.0)
Monocytes Absolute: 0.8 10*3/uL (ref 0.1–1.0)
Monocytes Relative: 11 %
Neutro Abs: 4.1 10*3/uL (ref 1.7–7.7)
Neutrophils Relative %: 55 %

## 2019-12-10 LAB — COMPREHENSIVE METABOLIC PANEL
ALT: 37 U/L (ref 0–44)
AST: 27 U/L (ref 15–41)
Albumin: 4.6 g/dL (ref 3.5–5.0)
Alkaline Phosphatase: 84 U/L (ref 38–126)
Anion gap: 13 (ref 5–15)
BUN: 15 mg/dL (ref 6–20)
CO2: 24 mmol/L (ref 22–32)
Calcium: 9.6 mg/dL (ref 8.9–10.3)
Chloride: 105 mmol/L (ref 98–111)
Creatinine, Ser: 0.95 mg/dL (ref 0.61–1.24)
GFR calc Af Amer: >60 mL/min (ref >60)
GFR calc non Af Amer: >60 mL/min (ref >60)
Glucose, Bld: 116 mg/dL — ABNORMAL HIGH (ref 70–99)
Potassium: 4.3 mmol/L (ref 3.5–5.1)
Sodium: 142 mmol/L (ref 135–145)
Total Bilirubin: 0.4 mg/dL (ref 0.3–1.2)
Total Protein: 7.5 g/dL (ref 6.5–8.1)

## 2019-12-10 LAB — I-STAT CHEM 8, ED
BUN: 17 mg/dL (ref 6–20)
Calcium, Ion: 1.19 mmol/L (ref 1.15–1.40)
Chloride: 106 mmol/L (ref 98–111)
Creatinine, Ser: 0.8 mg/dL (ref 0.61–1.24)
Glucose, Bld: 109 mg/dL — ABNORMAL HIGH (ref 70–99)
HCT: 49 % (ref 39.0–52.0)
Hemoglobin: 16.7 g/dL (ref 13.0–17.0)
Potassium: 4.2 mmol/L (ref 3.5–5.1)
Sodium: 141 mmol/L (ref 135–145)
TCO2: 27 mmol/L (ref 22–32)

## 2019-12-10 LAB — CBC
HCT: 47.1 % (ref 39.0–52.0)
Hemoglobin: 16 g/dL (ref 13.0–17.0)
MCH: 31 pg (ref 26.0–34.0)
MCHC: 34 g/dL (ref 30.0–36.0)
MCV: 91.3 fL (ref 80.0–100.0)
Platelets: 247 10*3/uL (ref 150–400)
RBC: 5.16 MIL/uL (ref 4.22–5.81)
RDW: 12.2 % (ref 11.5–15.5)
WBC: 7.5 10*3/uL (ref 4.0–10.5)
nRBC: 0 % (ref 0.0–0.2)

## 2019-12-10 LAB — CBG MONITORING, ED: Glucose-Capillary: 107 mg/dL — ABNORMAL HIGH (ref 70–99)

## 2019-12-10 LAB — PROTIME-INR
INR: 1.1 (ref 0.8–1.2)
Prothrombin Time: 13.4 seconds (ref 11.4–15.2)

## 2019-12-10 LAB — APTT: aPTT: 27 seconds (ref 24–36)

## 2019-12-10 MED ORDER — SODIUM CHLORIDE 0.9 % IV SOLN
200.0000 mg | Freq: Once | INTRAVENOUS | Status: AC
  Administered 2019-12-10: 200 mg via INTRAVENOUS
  Filled 2019-12-10: qty 20

## 2019-12-10 MED ORDER — SODIUM CHLORIDE 0.9% FLUSH: 3.0000 mL | Freq: Once | INTRAVENOUS | Status: DC

## 2019-12-10 NOTE — Addendum Note (Signed)
Addended by: Antonieta Iba on: 12/10/2019 01:23 PM   Modules accepted: Orders

## 2019-12-10 NOTE — Consult Note (Signed)
Neurology Consultation Reason for Consult: Left-sided numbness Referring Physician: Billy Fischer, E  CC: Left-sided numbness  History is obtained from: Patient  HPI: Joshua [name] is a 59 y.o. male with a history of meningioma resected in March 2014 he was followed by Dr. Mickeal Skinner for recurrent seizures.  He has been managed on Dilantin in the past, but never achieving seizure freedom.  He developed neuropathy and has had supratherapeutic Dilantin levels and therefore was recently (12/06/2019) change from Dilantin to Vimpat.  He is currently taking Vimpat 100 mg twice daily.  Today, he had a sensation that was very similar to the start of his typical seizure ("like I was about to have a big one, but it was in my back instead of my arm").  He had both paresthesia as well as numbness of his left arm and back.  He then had some "jumping" of his left arm as is typical with the seizures.  Due to this he took 2 mg of p.o. lorazepam and his wife brought him to the emergency department.  He states that he is almost back to his baseline at this time.   LKW: Approximately 6 PM tpa given?: no, mild symptoms   ROS: A 14 point ROS was performed and is negative except as noted in the HPI.   Past Medical History:  Diagnosis Date  . Brain tumor ([place]) 02/20/2013   brain tumor removed in March 2014, Dr Donald Pore  . Diffuse idiopathic skeletal hyperostosis 06/05/2013  . Dizziness   . Enlarged prostate   . Headache(784.0)    scattered  . 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[place]) 03/07/2019  . Meningioma ([place])   . Meningioma, recurrent of brain ([place]) 01/31/2019  . Neuropathy 12/05/2019  . Seizures ([place])    11/27/19 last sz 1 wk ago     Family History  Problem Relation Age of Onset  . Hypertension Mother   . Hypertension Father   . CAD Father   . CAD Brother      Social History:  reports that he quit smoking about 10 months ago. His smoking use included cigarettes. He has never used smokeless  tobacco. He reports current alcohol use of about 8.0 standard drinks of alcohol per week. He reports that he does not use drugs.   Exam: Current vital signs: There were no vitals taken for this visit. Vital signs in last 24 hours: Pulse Rate:  [98] 98 (05/25 1245) BP: (130)/(82) 130/82 (05/25 1245) SpO2:  [93 %] 93 % (05/25 1245) Weight:  [90.7 kg] 90.7 kg (05/25 1245)   Physical Exam  Constitutional: Appears well-developed and well-nourished.  Psych: Affect appropriate to situation Eyes: No scleral injection HENT: No OP obstrucion MSK: no joint deformities.  Cardiovascular: Normal rate and regular rhythm.  Respiratory: Effort normal, non-labored breathing GI: Soft.  No distension. There is no tenderness.  Skin: WDI  Neuro: Mental Status: Patient is awake, alert, oriented to person, place, month, year, and situation. Patient is able to give a clear and coherent history. No signs of aphasia or neglect Cranial Nerves: II: Visual Fields are full. Pupils are equal, round, and reactive to light.   III,IV, VI: EOMI without ptosis or diploplia.  V: Facial sensation is symmetric to temperature VII: Facial movement is symmetric.  VIII: hearing is intact to voice X: Uvula elevates symmetrically XI: Shoulder shrug is symmetric. XII: tongue is midline without atrophy or fasciculations.  Motor: Tone is normal. Bulk is normal. 5/5 strength was present in  bilateral arms, but he has mild impaired fine motor movements left arm, he has left is 4/5, which he states is his baseline since his tumor removal. Sensory: Sensation is diminished in the left leg.  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     I have reviewed labs in epic and the results pertinent to this consultation are: Creatinine 0.95  I have reviewed the images obtained: CT head-no acute intracranial hemorrhage  Impression: 59 year old male with a history of meningioma resection and subsequent seizure disorder who  presents with an episode that sounds very much like a seizure.  I suspect that his semiology is slightly different due to recent change in medications, but given that he had jerking and the sensation was very similar to his previous seizures, I do not think further evaluation is needed at this time.  I would go ahead and give 200 mg IV Vimpat(this will double as his evening dose), and increase his daily dose to 150 twice daily.  Recommendations: 1) Vimpat 200 mg x 1 2) increase daily dose to 150 twice daily 3) follow-up with Dr. Purvis Kilts, MD [facility] [phone]  If 7pm- 7am, please page neurology on call as listed in [place].

## 2019-12-10 NOTE — ED Notes (Signed)
[facility] called to activate code stroke per RN Blanchie Dessert request

## 2019-12-10 NOTE — Patient Instructions (Signed)
Medication Instructions:  Your physician recommends that you continue on your current medications as directed. Please refer to the Current Medication list given to you today.  *If you need a refill on your cardiac medications before your next appointment, please call your pharmacy*  Testing/Procedures: Your physician has requested that you have a lexiscan myoview. For further information please visit [email or web address]. Please follow instruction sheet, as given.  Your physician has recommended that you wear an event monitor. Event monitors are medical devices that record the heart's electrical activity. Doctors most often [place] these monitors to diagnose arrhythmias. Arrhythmias are problems with the speed or rhythm of the heartbeat. The monitor is a small, portable device. You can wear one while you do your normal daily activities. This is usually used to diagnose what is causing palpitations/syncope (passing out).  Your physician has recommended that you have a Calcium Score CT scan.    Follow-Up: At [facility], you and your health needs are our priority.  As part of our continuing mission to provide you with exceptional heart care, we have created designated Provider Care Teams.  These Care Teams include your primary Cardiologist (physician) and Advanced Practice Providers (APPs -  Physician Assistants and Nurse Practitioners) who all work together to provide you with the care you need, when you need it.  Follow up with Dr. Radford Pax as needed based on results of testing.

## 2019-12-10 NOTE — Telephone Encounter (Signed)
Call was placed to patient and voicemail was left informing patient to return phone call.

## 2019-12-10 NOTE — Progress Notes (Addendum)
Cardiology Office Note    Date:  12/10/2019   ID:  Joshua [name], DOB 1961/06/15, MRN [id number]  PCP:  Charlott Rakes, MD  Cardiologist:  Fransico Him, MD   Chief Complaint  Patient presents with  . New Patient (Initial Visit)    Aortic atherosclerosis    History of Present Illness:  Joshua [name] is a 59 y.o. male who is being seen today for the evaluation of aortic atherosclerosis at the request of Charlott Rakes, MD.  This is a 59yo AAM with a hx of malignant meningioma s/p resection 2014 and reoccurence 12/2018 s/p resection with seizures and recent dx of aortic atherosclerosis done at time of workup of Neurologic symptoms.  This was noted Ct angio of neck.  He is here today  and is doing well.  He denies any chest pain or pressure, SOB, DOE, PND, orthopnea, dizziness or syncope.  He occasionally has ankle edema and uses compression hose on occasion.  He complains of occasion fluttering in his chest that occurs 1-2 times monthly and lasts for about 30 seconds and resolves. He is compliant with his meds and is tolerating meds with no SE.    Past Medical History:  Diagnosis Date  . Brain tumor ([place]) 02/20/2013   brain tumor removed in March 2014, Dr Donald Pore  . Diffuse idiopathic skeletal hyperostosis 06/05/2013  . Dizziness   . Enlarged prostate   . Headache(784.0)    scattered  . 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[place]) 03/07/2019  . Meningioma ([place])   . Meningioma, recurrent of brain ([place]) 01/31/2019  . Neuropathy 12/05/2019  . Seizures ([place])    11/27/19 last sz 1 wk ago    Past Surgical History:  Procedure Laterality Date  . APPLICATION OF CRANIAL NAVIGATION N/A 01/10/2019   Procedure: APPLICATION OF CRANIAL NAVIGATION;  Surgeon: Erline Levine, MD;  Location: [facility];  Service: Neurosurgery;  Laterality: N/A;  . CATARACT EXTRACTION Right   . CRANIOTOMY Right 11/06/2012   Procedure: CRANIOTOMY TUMOR EXCISION;  Surgeon: Erline Levine, MD;  Location: [facility];   Service: Neurosurgery;  Laterality: Right;  Right Parasagittal craniotomy for meningioma with Stealth  . CRANIOTOMY Right 01/10/2019   Procedure: Right Parasagittal Craniotomy for Tumor;  Surgeon: Erline Levine, MD;  Location: [facility];  Service: Neurosurgery;  Laterality: Right;  Right parasagittal craniotomy for tumor  . ESOPHAGOGASTRODUODENOSCOPY    . right knee arthroscopy      Current Medications: Current Meds  Medication Sig  . aspirin EC 81 MG EC tablet Take 1 tablet (81 mg total) by mouth daily.  Marland Kitchen atorvastatin (LIPITOR) 20 MG tablet Take 1 tablet (20 mg total) by mouth daily.  . clonazePAM (KLONOPIN) 0.5 MG tablet Take 1 tablet (0.5 mg total) by mouth 2 (two) times daily.  . cyclobenzaprine (FLEXERIL) 10 MG tablet Take 1 tablet (10 mg total) by mouth 2 (two) times daily as needed for muscle spasms.  Marland Kitchen FIBER ADULT GUMMIES PO Take 2 tablets by mouth daily.   Marland Kitchen gabapentin (NEURONTIN) 300 MG capsule Take 1 capsule (300 mg total) by mouth 2 (two) times daily.  Marland Kitchen ibuprofen (ADVIL) 600 MG tablet Take 600 mg by mouth every 6 (six) hours as needed.  . Lacosamide 100 MG TABS Take 1 tablet (100 mg total) by mouth in the morning and at bedtime.  . lamoTRIgine (LAMICTAL) 100 MG tablet Take 2 tablets (200 mg total) by mouth 2 (two) times daily.  Marland Kitchen LORazepam (ATIVAN) 1 MG tablet  Take 1 tablet (1 mg total) by mouth every 8 (eight) hours as needed for seizure (for breakthrough seizures).  . mirtazapine (REMERON) 15 MG tablet Take 15 mg by mouth at bedtime.  Marland Kitchen omeprazole (PRILOSEC) 20 MG capsule Take 1 capsule (20 mg total) by mouth daily.  . ondansetron (ZOFRAN-ODT) 4 MG disintegrating tablet DISSOLVE 1 TABLET(4 MG) ON THE TONGUE EVERY 8 HOURS AS NEEDED FOR NAUSEA OR VOMITING (Patient taking differently: Take 4 mg by mouth every 8 (eight) hours as needed for nausea or vomiting. )  . vitamin C (ASCORBIC ACID) 500 MG tablet Take 500 mg by mouth daily.    Allergies:   Patient has no known allergies.    Social History   Socioeconomic History  . Marital status: Married    Spouse name: Not on file  . Number of children: 2  . Years of education: Not on file  . Highest education level: Not on file  Occupational History  . Not on file  Tobacco Use  . Smoking status: Former Smoker    Types: Cigarettes    Quit date: 01/29/2019    Years since quitting: 0.8  . Smokeless tobacco: Never Used  . Tobacco comment: hasn't smoked in a week  Substance and Sexual Activity  . Alcohol use: Yes    Alcohol/week: 8.0 standard drinks    Types: 6 Cans of beer, 2 Shots of liquor per week    Comment: occasionally, 11/27/19 none  . Drug use: No  . Sexual activity: Yes  Other Topics Concern  . Not on file  Social History Narrative   Lives with wife   Caffeine- soda occass   Social Determinants of Health   Financial Resource Strain:   . Difficulty of Paying Living Expenses:   Food Insecurity:   . Worried About Charity fundraiser in the Last Year:   . Arboriculturist in the Last Year:   Transportation Needs: No Transportation Needs  . Lack of Transportation (Medical): No  . Lack of Transportation (Non-Medical): No  Physical Activity:   . Days of Exercise per Week:   . Minutes of Exercise per Session:   Stress:   . Feeling of Stress :   Social Connections:   . Frequency of Communication with Friends and Family:   . Frequency of Social Gatherings with Friends and Family:   . Attends Religious Services:   . Active Member of Clubs or Organizations:   . Attends Archivist Meetings:   Marland Kitchen Marital Status:      Family History:  The patient's family history includes CAD in his brother and father; Hypertension in his father and mother.   ROS:   Please see the history of present illness.    ROS All other systems reviewed and are negative.  No flowsheet data found.     PHYSICAL EXAM:   VS:  BP 130/82   Pulse 98   Ht 5\' 9"  (1.753 m)   Wt 200 lb (90.7 kg)   SpO2 93%   BMI 29.53  kg/m    GEN: Well nourished, well developed, in no acute distress  HEENT: normal  Neck: no JVD, carotid bruits, or masses Cardiac: RRR; no murmurs, rubs, or gallops,no edema.  Intact distal pulses bilaterally.  Respiratory:  clear to auscultation bilaterally, normal work of breathing GI: soft, nontender, nondistended, + BS MS: no deformity or atrophy  Skin: warm and dry, no rash Neuro:  Alert and Oriented x 3, Strength and  sensation are intact Psych: euthymic mood, full affect  Wt Readings from Last 3 Encounters:  12/10/19 200 lb (90.7 kg)  12/05/19 201 lb 3.2 oz (91.3 kg)  12/03/19 203 lb 12.8 oz (92.4 kg)      Studies/Labs Reviewed:   EKG:  EKG is ordered today.  The ekg ordered today demonstrates NSR with IRBBB and RAD  Recent Labs: 07/09/2019: Magnesium 1.8 10/02/2019: B Natriuretic Peptide 46.8 11/02/2019: ALT 33 12/02/2019: BUN 7; Creatinine, Ser 0.98; Hemoglobin 15.3; Platelets 245; Potassium 4.3; Sodium 141   Lipid Panel No results found for: CHOL, TRIG, HDL, CHOLHDL, VLDL, LDLCALC, LDLDIRECT  Additional studies/ records that were reviewed today include:  OV notes and CT of neck    ASSESSMENT:    1. Aortic atherosclerosis ([place])   2. Pure hypercholesterolemia   3. Family history of coronary artery disease   4. Palpitations      PLAN:  In order of problems listed above:  1.  Aortic atherosclerosis -noted on Neck CT done for workup of neurologic sx.  -no evidence of carotic artery disease -this was not noted on a Chest CTA done 02/2019 -no coronary artery calcifications mentioned on CT -needs aggressive risk factor modification with good BP control and lipid control  2.  HLD -followed by PCP -continue Lipitor  3.  Fm hx of premature CAD -he is asymptomatic but has a very strong fm hx of brothers and his father having MIs at an early age -he also has HTN, HLD and vascular disease of his aorta -I will get a Lexiscan myoview to rule out ischemia given  that he cannot walk well from the meningioma surgery -I will also get a coronary calcium score to assess future cardiac risk  4.  Palpitations -I will get an event monitor to assess for PAF    Medication Adjustments/Labs and Tests Ordered: Current medicines are reviewed at length with the patient today.  Concerns regarding medicines are outlined above.  Medication changes, Labs and Tests ordered today are listed in the Patient Instructions below.  There are no Patient Instructions on file for this visit.   Signed, Fransico Him, MD  12/10/2019 1:16 PM    [facility] [address] [place], [place], [place]  [address] Phone: [phone]; Fax: [phone]

## 2019-12-10 NOTE — ED Triage Notes (Signed)
Pt BIB wife, who reports sudden onset confusion and left side numbness. LSN 1815. Code stroke activated per Dr. Billy Fischer.

## 2019-12-10 NOTE — Telephone Encounter (Signed)
Patient was called and informed that PCP did not send over a meter at his last appointment. Will discuss at follow up visit when A1C is to be rechecked.

## 2019-12-10 NOTE — Telephone Encounter (Signed)
Enrolled patient for a 30 day [employer] Cardiac Event Monitor to be mailed to patients home.

## 2019-12-10 NOTE — ED Notes (Signed)
Delay in discharge discussed with pt and wife at bedside. Unable to obtain discharge VS

## 2019-12-10 NOTE — ED Notes (Signed)
Discharge instructions discussed with pt. Pt verbalized understanding with no questions at this time. Pt to go home with wife 

## 2019-12-10 NOTE — ED Notes (Signed)
Pt ambulated independently in the hall.

## 2019-12-10 NOTE — ED Notes (Signed)
Called [facility] to cancel code stroke per dr Leonel Ramsay

## 2019-12-11 ENCOUNTER — Telehealth (HOSPITAL_COMMUNITY): Payer: Self-pay | Admitting: *Deleted

## 2019-12-11 NOTE — Telephone Encounter (Signed)
Left message on voicemail per DPR in reference to upcoming appointment scheduled on 12/18/19 with detailed instructions given per Myocardial Perfusion Study Information Sheet for the test. LM to arrive 15 minutes early, and that it is imperative to arrive on time for appointment to keep from having the test rescheduled. If you need to cancel or reschedule your appointment, please call the office within 24 hours of your appointment. Failure to do so may result in a cancellation of your appointment, and a $50 no show fee. Phone number given for call back for any questions. Kirstie Peri

## 2019-12-11 NOTE — ED Provider Notes (Signed)
[facility] Provider Note   CSN: [id number] Arrival date & time: 12/10/19  1821     History Chief Complaint  Patient presents with   Altered Mental Status    Joshua [name] is a 59 y.o. male.  HPI      59yo male with history of meningioma resected in 2014 presents with concern for episode of numbness of the left arm and confusion.  Reports he felt lightheaded but did not have LOC.  Similar in some ways to previous seizure but location of symptoms different--reports typical seizures no LOC, has sensation that goes up and down leg .  Reports he had some jumping of his left arm and left arm numbness. TOok ativan and wife brought him to ED.  This episode felt different as it was "in my head and arm" rather than  Leg. Sensation of dizziness/arm symptoms.  Symptoms started 53min prior to arrival.    Past Medical History:  Diagnosis Date   Brain tumor ([place]) 02/20/2013   brain tumor removed in March 2014, Dr Donald Pore   Diffuse idiopathic skeletal hyperostosis 06/05/2013   Dizziness    Enlarged prostate    Headache(784.0)    scattered   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[place]) 03/07/2019   Meningioma ([place])    Meningioma, recurrent of brain ([place]) 01/31/2019   Neuropathy 12/05/2019   Seizures ([place])    11/27/19 last sz 1 wk ago    Patient Active Problem List   Diagnosis Date Noted   Neuropathy 12/05/2019   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[place]) 03/07/2019   Seizures ([place]) 01/31/2019   Meningioma, recurrent of brain ([place]) 01/31/2019   Meningioma ([place]) 01/03/2019   Brain tumor ([place]) 12/27/2018   Diffuse idiopathic skeletal hyperostosis 06/05/2013    Past Surgical History:  Procedure Laterality Date   APPLICATION OF CRANIAL NAVIGATION N/A 01/10/2019   Procedure: APPLICATION OF CRANIAL NAVIGATION;  Surgeon: Erline Levine, MD;  Location: [facility];  Service: Neurosurgery;  Laterality: N/A;   CATARACT EXTRACTION  Right    CRANIOTOMY Right 11/06/2012   Procedure: CRANIOTOMY TUMOR EXCISION;  Surgeon: Erline Levine, MD;  Location: [facility];  Service: Neurosurgery;  Laterality: Right;  Right Parasagittal craniotomy for meningioma with Stealth   CRANIOTOMY Right 01/10/2019   Procedure: Right Parasagittal Craniotomy for Tumor;  Surgeon: Erline Levine, MD;  Location: [facility];  Service: Neurosurgery;  Laterality: Right;  Right parasagittal craniotomy for tumor   ESOPHAGOGASTRODUODENOSCOPY     right knee arthroscopy         Family History  Problem Relation Age of Onset   Hypertension Mother    Hypertension Father    CAD Father    CAD Brother     Social History   Tobacco Use   Smoking status: Former Smoker    Types: Cigarettes    Quit date: 01/29/2019    Years since quitting: 0.8   Smokeless tobacco: Never Used   Tobacco comment: hasn't smoked in a week  Substance Use Topics   Alcohol use: Yes    Alcohol/week: 8.0 standard drinks    Types: 6 Cans of beer, 2 Shots of liquor per week    Comment: occasionally, 11/27/19 none   Drug use: No    Home Medications Prior to Admission medications   Medication Sig Start Date End Date Taking? Authorizing Provider  acetaminophen (TYLENOL) 500 MG tablet Take 1,000 mg by mouth daily as needed for mild pain.   Yes [provider]  aspirin EC 81 MG EC tablet Take 1 tablet (81 mg total) by mouth daily. 01/14/19  Yes Erline Levine, MD  atorvastatin (LIPITOR) 20 MG tablet Take 1 tablet (20 mg total) by mouth daily. 11/26/19  Yes Charlott Rakes, MD  clonazePAM (KLONOPIN) 0.5 MG tablet Take 1 tablet (0.5 mg total) by mouth 2 (two) times daily. 10/14/19  Yes Vaslow, Acey Lav, MD  cyclobenzaprine (FLEXERIL) 10 MG tablet Take 1 tablet (10 mg total) by mouth 2 (two) times daily as needed for muscle spasms. 10/24/19  Yes Newlin, Charlane Ferretti, MD  FIBER ADULT GUMMIES PO Take 2 tablets by mouth daily.    Yes [provider]  gabapentin (NEURONTIN)  300 MG capsule Take 1 capsule (300 mg total) by mouth 2 (two) times daily. 12/03/19  Yes Vaslow, Acey Lav, MD  ibuprofen (ADVIL) 600 MG tablet Take 600 mg by mouth every 6 (six) hours as needed for moderate pain.    Yes [provider]  Lacosamide 100 MG TABS Take 1 tablet (100 mg total) by mouth in the morning and at bedtime. 12/05/19  Yes Vaslow, Acey Lav, MD  lamoTRIgine (LAMICTAL) 100 MG tablet Take 2 tablets (200 mg total) by mouth 2 (two) times daily. 10/31/19  Yes Vaslow, Acey Lav, MD  LORazepam (ATIVAN) 1 MG tablet Take 1 tablet (1 mg total) by mouth every 8 (eight) hours as needed for seizure (for breakthrough seizures). 12/03/19  Yes Vaslow, Acey Lav, MD  mirtazapine (REMERON) 15 MG tablet Take 15 mg by mouth at bedtime as needed (Sleep).    Yes [provider]  omeprazole (PRILOSEC) 20 MG capsule Take 1 capsule (20 mg total) by mouth daily. 12/06/19  Yes Bast, Traci A, NP  ondansetron (ZOFRAN-ODT) 4 MG disintegrating tablet DISSOLVE 1 TABLET(4 MG) ON THE TONGUE EVERY 8 HOURS AS NEEDED FOR NAUSEA OR VOMITING Patient taking differently: Take 4 mg by mouth every 8 (eight) hours as needed for nausea or vomiting.  11/18/19  Yes Vaslow, Acey Lav, MD  vitamin C (ASCORBIC ACID) 500 MG tablet Take 500 mg by mouth daily.   Yes [provider]    Allergies    Patient has no known allergies.  Review of Systems   Review of Systems  Constitutional: Negative for fever.  HENT: Negative for sore throat.   Eyes: Negative for visual disturbance.  Respiratory: Negative for shortness of breath.   Cardiovascular: Negative for chest pain.  Gastrointestinal: Negative for abdominal pain.  Genitourinary: Negative for difficulty urinating.  Musculoskeletal: Negative for back pain and neck stiffness.  Skin: Negative for rash.  Neurological: Positive for dizziness and numbness. Negative for syncope, facial asymmetry, speech difficulty, weakness and headaches.    Psychiatric/Behavioral: Positive for confusion.    Physical Exam Updated Vital Signs BP (!) 127/92    Pulse 83    Temp 98.5 F (36.9 C) (Oral)    Resp 18    SpO2 97%   Physical Exam Vitals and nursing note reviewed.  Constitutional:      General: He is not in acute distress.    Appearance: Normal appearance. He is well-developed. He is not ill-appearing or diaphoretic.  HENT:     Head: Normocephalic and atraumatic.  Eyes:     General: No visual field deficit.    Extraocular Movements: Extraocular movements intact.     Conjunctiva/sclera: Conjunctivae normal.     Pupils: Pupils are equal, round, and reactive to light.  Cardiovascular:     Rate and Rhythm:  Normal rate and regular rhythm.     Pulses: Normal pulses.     Heart sounds: Normal heart sounds. No murmur. No friction rub. No gallop.   Pulmonary:     Effort: Pulmonary effort is normal. No respiratory distress.     Breath sounds: Normal breath sounds. No wheezing or rales.  Abdominal:     General: There is no distension.     Palpations: Abdomen is soft.     Tenderness: There is no abdominal tenderness. There is no guarding.  Musculoskeletal:        General: No swelling or tenderness.     Cervical back: Normal range of motion.  Skin:    General: Skin is warm and dry.     Findings: No erythema or rash.  Neurological:     General: No focal deficit present.     Mental Status: He is alert and oriented to person, place, and time.     GCS: GCS eye subscore is 4. GCS verbal subscore is 5. GCS motor subscore is 6.     Cranial Nerves: No cranial nerve deficit, dysarthria or facial asymmetry.     Sensory: Sensory deficit (LUE) present.     Motor: No weakness or tremor.     Coordination: Coordination normal. Finger-Nose-Finger Test normal.     Gait: Gait normal.     ED Results / Procedures / Treatments   Labs (all labs ordered are listed, but only abnormal results are displayed) Labs Reviewed  COMPREHENSIVE METABOLIC  PANEL - Abnormal; Notable for the following components:      Result Value   Glucose, Bld 116 (*)    All other components within normal limits  I-STAT CHEM 8, ED - Abnormal; Notable for the following components:   Glucose, Bld 109 (*)    All other components within normal limits  CBG MONITORING, ED - Abnormal; Notable for the following components:   Glucose-Capillary 107 (*)    All other components within normal limits  CBC  DIFFERENTIAL  PROTIME-INR  APTT    EKG EKG Interpretation  Date/Time:  Tuesday Dec 10 2019 19:12:52 EDT Ventricular Rate:  101 PR Interval:    QRS Duration: 104 QT Interval:  351 QTC Calculation: 455 R Axis:   116 Text Interpretation: Sinus tachycardia Consider right ventricular hypertrophy Since prior ECG, rate has increased Confirmed by Gareth Morgan [phone]) on 12/10/2019 8:12:04 PM   Radiology CT HEAD CODE STROKE WO CONTRAST  Result Date: 12/10/2019 CLINICAL DATA:  Code stroke. Sudden onset confusion and left-sided numbness EXAM: CT HEAD WITHOUT CONTRAST TECHNIQUE: Contiguous axial images were obtained from the base of the skull through the vertex without intravenous contrast. COMPARISON:  11/02/2019 FINDINGS: Brain: There is no acute intracranial hemorrhage, mass effect, or edema. There is no new loss of gray-white differentiation. Encephalomalacia is again identified right frontoparietal and left parietal lobes at the vertex. Ventricles are stable in size. No extra-axial fluid collection. Limited evaluation for residual meningioma on this study. Vascular: No hyperdense vessel. Skull: Vertex craniotomy.  Otherwise unremarkable. Sinuses/Orbits: No acute abnormality. Other: Mastoid air cells are clear. ASPECTS ([facility] Stroke Program Early CT Score) - Ganglionic level infarction (caudate, lentiform nuclei, internal capsule, insula, M1-M3 cortex): 7 - Supraganglionic infarction (M4-M6 cortex): 3 Total score (0-10 with 10 being normal): 10, allowing for chronic  findings IMPRESSION: No acute intracranial hemorrhage.  ASPECT score is 10. These results were communicated to Dr. Leonel Ramsay at 7:18 pmon 5/25/2021by text page via the [facility] messaging system. Electronically Signed  By: Macy Mis M.D.   On: 12/10/2019 19:19    Procedures Procedures (including critical care time)  Medications Ordered in ED Medications  lacosamide (VIMPAT) 200 mg in sodium chloride 0.9 % 25 mL IVPB (0 mg Intravenous Stopped 12/10/19 2036)    ED Course  I have reviewed the triage vital signs and the nursing notes.  Pertinent labs & imaging results that were available during my care of the patient were reviewed by me and considered in my medical decision making (see chart for details).    MDM Rules/Calculators/A&P                      59yo male with history of meningioma resected in 2014 presents with concern for episode of numbness of the left arm and confusion. Code stroke called in Triage and Dr. Leonel Ramsay came to bedside to evaluate patient.   CT without acute findings. No acute findings on labs.  On further history with Dr. Leonel Ramsay, he had recent adjustments in his seizure medications and episode most consistent with seizure, low suspicion for TIA or CVA. He has returned to baseline. Vimpat ordered by Dr. Leonel Ramsay in ED and recommend follow up with his seizure doctor. Patient discharged in stable condition with understanding of reasons to return.    Final Clinical Impression(s) / ED Diagnoses Final diagnoses:  Seizure [facility])    Rx / DC Orders ED Discharge Orders    None       Gareth Morgan, MD 12/11/19 [phone]

## 2019-12-12 ENCOUNTER — Other Ambulatory Visit: Payer: Self-pay | Admitting: Internal Medicine

## 2019-12-12 ENCOUNTER — Ambulatory Visit (HOSPITAL_COMMUNITY): Payer: Medicaid Other

## 2019-12-12 MED ORDER — LACOSAMIDE 100 MG PO TABS: 200.0000 mg | ORAL_TABLET | Freq: Two times a day (BID) | ORAL | 3 refills | Status: DC

## 2019-12-13 ENCOUNTER — Other Ambulatory Visit: Payer: Self-pay

## 2019-12-13 ENCOUNTER — Telehealth: Payer: Self-pay

## 2019-12-13 ENCOUNTER — Ambulatory Visit: Payer: Medicaid Other

## 2019-12-13 DIAGNOSIS — R2689 Other abnormalities of gait and mobility: Secondary | ICD-10-CM

## 2019-12-13 DIAGNOSIS — M6281 Muscle weakness (generalized): Secondary | ICD-10-CM

## 2019-12-13 DIAGNOSIS — M25651 Stiffness of right hip, not elsewhere classified: Secondary | ICD-10-CM | POA: Diagnosis not present

## 2019-12-13 DIAGNOSIS — M25551 Pain in right hip: Secondary | ICD-10-CM | POA: Diagnosis not present

## 2019-12-13 NOTE — Therapy (Signed)
[facility] [address] [place], [place], [address] Phone: [phone]   Fax:  [phone]  Physical Therapy Treatment  Patient Details  Name: Joshua [name] MRN: [id number] Date of Birth: 10-16-1960 Referring Provider (PT): Cecil Cobbs   Encounter Date: 12/13/2019  PT End of Session - 12/13/19 0725    Visit Number  15    Number of Visits  28    Date for PT Re-Evaluation  01/30/20   POC for 9 weeks, Cert for 90 days   Authorization Type  Medicaid 3 visits 06/03/11/02/2019, 12/10-12/30/20 6 units, 1/7-1/27/2021 3 visits, 1/29-3/4 10 visits, 3 visits 3/8-3/28, 3 visits 4/16-5/6, 4 visits 5/7-6/3    Authorization - Visit Number  3    Authorization - Number of Visits  4    PT Start Time  0720   pt running behind   PT Stop Time  0759    PT Time Calculation (min)  39 min    Equipment Utilized During Treatment  Gait belt    Activity Tolerance  Patient tolerated treatment well;No increased pain    Behavior During Therapy  WFL for tasks assessed/performed       Past Medical History:  Diagnosis Date  . Brain tumor ([place]) 02/20/2013   brain tumor removed in March 2014, Dr Donald Pore  . Diffuse idiopathic skeletal hyperostosis 06/05/2013  . Dizziness   . Enlarged prostate   . Headache(784.0)    scattered  . 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[place]) 03/07/2019  . Meningioma ([place])   . Meningioma, recurrent of brain ([place]) 01/31/2019  . Neuropathy 12/05/2019  . Seizures ([place])    11/27/19 last sz 1 wk ago    Past Surgical History:  Procedure Laterality Date  . APPLICATION OF CRANIAL NAVIGATION N/A 01/10/2019   Procedure: APPLICATION OF CRANIAL NAVIGATION;  Surgeon: Erline Levine, MD;  Location: [facility];  Service: Neurosurgery;  Laterality: N/A;  . CATARACT EXTRACTION Right   . CRANIOTOMY Right 11/06/2012   Procedure: CRANIOTOMY TUMOR EXCISION;  Surgeon: Erline Levine, MD;  Location: [facility];  Service: Neurosurgery;   Laterality: Right;  Right Parasagittal craniotomy for meningioma with Stealth  . CRANIOTOMY Right 01/10/2019   Procedure: Right Parasagittal Craniotomy for Tumor;  Surgeon: Erline Levine, MD;  Location: [facility];  Service: Neurosurgery;  Laterality: Right;  Right parasagittal craniotomy for tumor  . ESOPHAGOGASTRODUODENOSCOPY    . right knee arthroscopy      There were no vitals filed for this visit.  Subjective Assessment - 12/13/19 0724    Subjective  Pt reports that he had another seizure the other day and went to ED. Has been doing well since. Has talked to Dr. Mickeal Skinner and they have put him on Vimpat. Pt reports that he is not always using the cane anymore and walked in without it. Is having some popping in left knee and wearing brace on it when it bothers him more.    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 History of right THA on 2019.    Patient Stated Goals  Pt would like to be able to elimate his cane use.    Currently in Pain?  No/denies    Pain Onset  More than a month ago         [facility] PT Assessment - 12/13/19 0726      Functional Gait  Assessment   Gait assessed  Yes    Gait Level Surface  Walks 20 ft in less than 7 sec but greater than 5.5 sec, uses assistive device, slower speed, mild gait deviations, or deviates 6-10 in outside of the 12 in walkway width.    Change in Gait Speed  Able to change speed, demonstrates mild gait deviations, deviates 6-10 in outside of the 12 in walkway width, or no gait deviations, unable to achieve a major change in velocity, or uses a change in velocity, or uses an assistive device.    Gait with Horizontal Head Turns  Performs head turns smoothly with no change in gait. Deviates no more than 6 in outside 12 in walkway width    Gait with Vertical Head Turns  Performs head turns with no change in gait.  Deviates no more than 6 in outside 12 in walkway width.    Gait and Pivot Turn  Cannot turn safely, requires assistance to turn and stop.    Step Over Obstacle  Is able to step over one shoe box (4.5 in total height) without changing gait speed. No evidence of imbalance.    Gait with Narrow Base of Support  Ambulates less than 4 steps heel to toe or cannot perform without assistance.    Gait with Eyes Closed  Cannot walk 20 ft without assistance, severe gait deviations or imbalance, deviates greater than 15 in outside 12 in walkway width or will not attempt task.    Ambulating Backwards  Walks 20 ft, uses assistive device, slower speed, mild gait deviations, deviates 6-10 in outside 12 in walkway width.    Steps  Alternating feet, must use rail.    Total Score  16                    [facility] Adult PT Treatment/Exercise - 12/13/19 0726      Ambulation/Gait   Ambulation/Gait  Yes    Ambulation/Gait Assistance  5: Supervision;4: Min assist;4: Min guard;3: Mod assist    Ambulation/Gait Assistance Details  Pt started to have more difficulty the longer he went with leaning forward over feet with bilateral foot flat scuffing feet. Unable to correct with static stance with weight over toes and falling forward. PT assisted pt min/mod assist to seat to rest. When got up balance had improved and was able to stand without anterior lean.    Ambulation Distance (Feet)  1000 Feet    Assistive device  None    Gait Pattern  Step-through pattern;Decreased step length - right;Decreased step length - left;Decreased hip/knee flexion - left;Poor foot clearance - left    Ambulation Surface  Level;Unlevel;Indoor;Outdoor;Paved;Grass      Standardized Balance Assessment   Standardized Balance Assessment  Timed Up and Go Test      Timed Up and Go Test   TUG  Normal TUG    Normal TUG (seconds)  11   12 sec and 10 sec            PT Education - 12/13/19 0809    Education Details  Pt was educated on  plan to continue versus d/c today due to more balance deficits noted. Advised to use [facility] or rollator on nonlevel surfaces for safety.    Person(s) Educated  Patient    Methods  Explanation    Comprehension  Verbalized understanding       PT Short Term Goals - 12/13/19 0736      PT SHORT TERM GOAL #1   Title  Pt  will be independent with HEP to continue strength and balance gains on own.    Baseline  Pt reports he has been working on [employer] and continues to walk for exercise.    Time  4    Period  Weeks    Status  Achieved    Target Date  11/29/19      PT SHORT TERM GOAL #2   Title  Pt will reduce TUG to <10 seconds w/ AD to demonstrate improved ambulation and further reduce risk for falls.    Baseline  11.81 seconds w/o AD, 10.92 seconds w/ AD, 11 sec without AD on 12/13/19    Time  4    Period  Weeks    Status  Not Met    Target Date  11/29/19        PT Long Term Goals - 12/13/19 0736      PT LONG TERM GOAL #1   Title  Pt will increase FGA from 21/30 to >23/30 for improved balance and gait safety.    Baseline  21/30 on 09/13/19, 18/30 on 4/16, 16/30 on 12/13/19    Time  9    Period  Weeks    Status  Not Met      PT LONG TERM GOAL #2   Title  Pt will ambulate >700' on varied surfaces independently without AD for improved community mobility.    Baseline  Pt ambulated supervision on level surfaces without AD. On outdoor surfaces assistance varied from supervision up to mod assist as patient started to have severe anterior lean without AD. Currently using SPC or rollator when has to go longer distances.    Time  9    Period  Weeks    Status  Not Met      PT LONG TERM GOAL #3   Title  Pt will be independent with progressive HEP to continue strength and balance gains on own.    Baseline  Pt reports his HEP has been going well.    Time  9    Period  Weeks    Status  Achieved            Plan - 12/13/19 3329    Clinical Impression Statement  PT reassessed goals today as  patient was at end of auth period. Pt walked in to clinic without AD and noted smoother, more fluid gait but as testing went on pt became less steady. PT noted for first time pt not only having some tone present in left leg but patient having trouble keeping balance leaning anterior having to catch himself with every step needing increased assistance. FGA score was also lower showing increased fall risk. Pt did have recent seizure med change to Vimpat. PT will reach out to neuro oncologist about changes noted and extend visits at this time.    Examination-Activity Limitations  Locomotion Level;Stairs;Dressing    Examination-Participation Restrictions  Community Activity    Stability/Clinical Decision Making  Evolving/Moderate complexity    Rehab Potential  Good    PT Frequency  2x / week    PT Duration  Other (comment)   9 Weeks   PT Treatment/Interventions  ADLs/Self Care Home Management;DME Instruction;Gait training;Stair training;Functional mobility training;Therapeutic activities;Patient/family education;Neuromuscular re-education;Balance training;Therapeutic exercise;Orthotic Fit/Training;Manual techniques;Passive range of motion;Aquatic Therapy    PT Next Visit Plan  How has balance been? Continue gait training working on improved toe off and left hip flexion. Try BWS over treadmill again. Used XL harness. Strengthening and Stretching exercises  Consulted and Agree with Plan of Care  Patient       Patient will benefit from skilled therapeutic intervention in order to improve the following deficits and impairments:  Abnormal gait, Decreased activity tolerance, Decreased balance, Difficulty walking, Decreased strength, Impaired sensation, Postural dysfunction, Impaired flexibility, Decreased mobility, Decreased range of motion  Visit Diagnosis: Other abnormalities of gait and mobility  Muscle weakness (generalized)     Problem List Patient Active Problem List   Diagnosis Date Noted   . Neuropathy 12/05/2019  . 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[place]) 03/07/2019  . Seizures ([place]) 01/31/2019  . Meningioma, recurrent of brain ([place]) 01/31/2019  . Meningioma ([place]) 01/03/2019  . Brain tumor ([place]) 12/27/2018  . Diffuse idiopathic skeletal hyperostosis 06/05/2013    Electa Sniff, PT, DPT, NCS 12/13/2019, 8:15 AM  [facility] [address] [place], [place], [address] Phone: [phone]   Fax:  [phone]  Name: NORBERTO WISHON MRN: [id number] Date of Birth: 1960-11-18

## 2019-12-13 NOTE — Telephone Encounter (Signed)
Dr. Mickeal Skinner, I saw Joshua [name] today for possible PT discharge but noted some changes in his presentation. He was walking fairly well at first but as we went on he developed a severe anterior trunk lean with short steps, and poor foot clearance scuffing feet. When I had him stop and just try to stand he could not get his balance and was leaning forward on toes needing min/mod assist to keep balance. I had him sit for a bit and this did improve but wanted to report this to you as his balance was significantly worse today. I know he did have a recent med change to Vimpat and was wondering if there could be contributing at all? I'm going to extend him out a bit more due to this change. Thanks so much for any input you have. Cherly Anderson, PT, DPT, NCS

## 2019-12-17 ENCOUNTER — Telehealth: Payer: Self-pay | Admitting: Cardiology

## 2019-12-17 ENCOUNTER — Telehealth: Payer: Self-pay | Admitting: *Deleted

## 2019-12-17 NOTE — Telephone Encounter (Signed)
Received after hours 12/15/2019 2:38 pm.   Initial Comment: Caller states his neurologist told him he had a seizure the other day and has been having them, states he was given lorazepam but only has 4 left.  Caller states he would like to get a bed in the ER to have someone watch him due to these seizures and would like to monitored for the withdrawal from the lorazepam until the doctor is back on duty.   Called patient to discuss getting him set up for appointment.  He states that he isn't needing more medication.  He states that he was told be Dr. Mickeal Skinner that the symptoms he was having might have been more so from over usage of the Lorazepam and he thought he might need to be monitored for withdrawal during that time and that prompted his call to the after hours line.    He states that he hasn't needed the Lorazepam as much since started the Vimpat.  She states that by decreasing the dosage of the Lorazepam he hasn't seen as many issues.  Thanked him for his clarification of the reason he called into the after hours line and advised that if anything were to change in him status or if he were to need refills of any nature then he will need a in person visit to evaluate.

## 2019-12-17 NOTE — Telephone Encounter (Signed)
Spoke with the patient and advised him of instructions for Stress test. Patient verbalized understanding.

## 2019-12-17 NOTE — Telephone Encounter (Signed)
New Message    Pt is calling and is wondering what his restrictions are for his stress test    Please advise

## 2019-12-18 ENCOUNTER — Other Ambulatory Visit: Payer: Self-pay | Admitting: *Deleted

## 2019-12-18 ENCOUNTER — Ambulatory Visit (HOSPITAL_COMMUNITY): Payer: Medicaid Other | Attending: Cardiovascular Disease

## 2019-12-18 ENCOUNTER — Ambulatory Visit
Admission: RE | Admit: 2019-12-18 | Discharge: 2019-12-18 | Disposition: A | Discharge status: Home / Self Care | Payer: Self-pay | Source: Ambulatory Visit | Attending: Cardiology | Admitting: Cardiology

## 2019-12-18 ENCOUNTER — Other Ambulatory Visit: Payer: Self-pay

## 2019-12-18 DIAGNOSIS — Z8249 Family history of ischemic heart disease and other diseases of the circulatory system: Secondary | ICD-10-CM

## 2019-12-18 DIAGNOSIS — Z136 Encounter for screening for cardiovascular disorders: Secondary | ICD-10-CM | POA: Diagnosis present

## 2019-12-18 LAB — MYOCARDIAL PERFUSION IMAGING
LV dias vol: 82 mL (ref 62–150)
LV sys vol: 33 mL
Peak HR: 120 {beats}/min
Rest HR: 96 {beats}/min
SDS: 0
SRS: 0
SSS: 0
TID: 1.09

## 2019-12-18 MED ORDER — TECHNETIUM TC 99M TETROFOSMIN IV KIT
33.0000 | PACK | Freq: Once | INTRAVENOUS | Status: AC | PRN
  Administered 2019-12-18: 33 via INTRAVENOUS
  Filled 2019-12-18: qty 33

## 2019-12-18 MED ORDER — TECHNETIUM TC 99M TETROFOSMIN IV KIT
10.5000 | PACK | Freq: Once | INTRAVENOUS | Status: AC | PRN
  Administered 2019-12-18: 10.5 via INTRAVENOUS
  Filled 2019-12-18: qty 11

## 2019-12-18 MED ORDER — ADENOSINE (DIAGNOSTIC) 3 MG/ML IV SOLN
0.5600 mg/kg | Freq: Once | INTRAVENOUS | Status: AC
Start: 2019-12-18 — End: 2019-12-18
  Administered 2019-12-18: 50.7 mg via INTRAVENOUS

## 2019-12-18 NOTE — Progress Notes (Signed)
MRI was ordered to be done in June 2021 but since patient has had several other tests performed with ED visits, order for MRI of the brain was canceled.

## 2019-12-19 ENCOUNTER — Telehealth: Payer: Self-pay | Admitting: Cardiology

## 2019-12-19 NOTE — Telephone Encounter (Signed)
Joshua [name] is calling requesting a nurse call him to go over his results of the tests her had performed yesterday, 12/18/19. Please advise.

## 2019-12-19 NOTE — Telephone Encounter (Signed)
Spoke with the patient and went over results from CT scan and stress test. Patient verbalized understanding.

## 2019-12-20 ENCOUNTER — Telehealth: Payer: Self-pay | Admitting: Medical Oncology

## 2019-12-20 ENCOUNTER — Telehealth: Payer: Self-pay

## 2019-12-20 ENCOUNTER — Telehealth: Payer: Self-pay | Admitting: Cardiology

## 2019-12-20 NOTE — Telephone Encounter (Signed)
Pt called and said he just got his monitor in the mail. He wanted to know if it was still necessary for him to wear the monitor even though his stress test was normal. Please let the patient know what he needs to do

## 2019-12-20 NOTE — Telephone Encounter (Signed)
Pt said he is took  clonazepam 2 tablets last night and . I told him that is not the way Dr Mickeal Skinner prescribed it. I also told him to contact his PCP regarding his meds. I  put him on hold and when I got back to pt he was not on the phone. Dr Mickeal Skinner aware.

## 2019-12-20 NOTE — Telephone Encounter (Signed)
Pt states dizziness and feeling out of place may be coming from the pill [place] prescribed. Pt states neurologist talked to him about it and told him to talk to [facility] about the medication.

## 2019-12-23 ENCOUNTER — Encounter (INDEPENDENT_AMBULATORY_CARE_PROVIDER_SITE_OTHER): Payer: Medicaid Other

## 2019-12-23 DIAGNOSIS — R002 Palpitations: Secondary | ICD-10-CM | POA: Diagnosis not present

## 2019-12-23 NOTE — Telephone Encounter (Signed)
Spoke with the patient and advised him that he should still wear the heart monitor. Patient verbalized understanding.

## 2019-12-24 ENCOUNTER — Other Ambulatory Visit: Payer: Self-pay | Admitting: Internal Medicine

## 2019-12-24 ENCOUNTER — Telehealth: Payer: Self-pay | Admitting: *Deleted

## 2019-12-24 MED ORDER — LACOSAMIDE 100 MG PO TABS: 200.0000 mg | ORAL_TABLET | Freq: Two times a day (BID) | ORAL | 3 refills | Status: DC

## 2019-12-24 NOTE — Telephone Encounter (Signed)
Phone Call: Patient states at pharmacy without any refill of medication.  Error in transmitting last Rx from 12/12/2019 seen on fill.  Dr. Mickeal Skinner refilled electronically again.  Patient states that he also is running out of Ativan and wants refill, reports being more irritable.  Both issues warrant a visit with Dr Mickeal Skinner, appt scheduled.

## 2019-12-25 NOTE — Telephone Encounter (Signed)
Patient was called and infomred that nurse will inform PCP of call, and follow up with patient when she responds.

## 2019-12-26 ENCOUNTER — Other Ambulatory Visit: Payer: Self-pay

## 2019-12-26 ENCOUNTER — Inpatient Hospital Stay: Payer: Medicaid Other | Attending: Internal Medicine | Admitting: Internal Medicine

## 2019-12-26 VITALS — BP 134/87 | HR 84 | Temp 98.2°F | Resp 18 | Ht 69.0 in | Wt 202.9 lb

## 2019-12-26 DIAGNOSIS — G40109 Localization-related (focal) (partial) symptomatic epilepsy and epileptic syndromes with simple partial seizures, not intractable, without status epilepticus: Secondary | ICD-10-CM | POA: Diagnosis not present

## 2019-12-26 DIAGNOSIS — C700 Malignant neoplasm of cerebral meninges: Secondary | ICD-10-CM

## 2019-12-26 DIAGNOSIS — F419 Anxiety disorder, unspecified: Secondary | ICD-10-CM | POA: Insufficient documentation

## 2019-12-26 DIAGNOSIS — Z87891 Personal history of nicotine dependence: Secondary | ICD-10-CM | POA: Insufficient documentation

## 2019-12-26 DIAGNOSIS — R569 Unspecified convulsions: Secondary | ICD-10-CM | POA: Diagnosis not present

## 2019-12-26 DIAGNOSIS — Z79899 Other long term (current) drug therapy: Secondary | ICD-10-CM | POA: Insufficient documentation

## 2019-12-26 DIAGNOSIS — D320 Benign neoplasm of cerebral meninges: Secondary | ICD-10-CM | POA: Diagnosis not present

## 2019-12-26 MED ORDER — LORAZEPAM 1 MG PO TABS: 1.0000 mg | ORAL_TABLET | Freq: Three times a day (TID) | ORAL | 0 refills | Status: DC | PRN

## 2019-12-26 NOTE — Progress Notes (Signed)
[facility] [place], [place] [address] [phone]   Interval Evaluation  Date of Service: 12/26/19 Patient Name: Joshua [name] Patient MRN: [id number] Patient DOB: May 08, 1961 Provider: [name], MD  Identifying Statement:  Joshua [name] is a 59 y.o. 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 Vertell Limber (WHO I) 01/10/19: Repeat craniotomy, debulking resection by Dr. Vertell Limber after tumor recurrence, seizures.  Path demonstrates islands of anaplasia c/w grade II/III. 04/05/19: Completes post-operative IMRT with Dr. Lisbeth Renshaw  Interval History:  Joshua [name] presents today for follow up after clinical changes.  He describes several breakthrough seizures in the past few weeks including 2 ED visits.  Seizures are described as "1 minute of left leg numbness, without cognitive changes".  He has been dosing ativan more frequently as well.  No involvement of his hands at this time.  He maintains compliance with Vimpat, Lamictal, Klonopin. Neuropathic pain symptoms have resolved since stopping Dilantin and initiating rx with gabapentin.  H+P (02/05/19) Patient presents to review clinical course and care for his meningioma.  Initially he presented in 2014 with headache syndrome, was found to have tumor on imaging which was resected by Dr. Vertell Limber.  The patient was lost to follow up for ~5 years until he developed seizures, described as "twitching of left leg spreading to arm" this past month.  He required loads of Keppra and Dilantin to break events.  MRI demonstrated significant regrowth of the mass, and repeat craniotomy was performed on 01/10/19.  Small amount of residual tumor was visible on post-operative MRI.  Since surgery he has continued to experience numbness and clumsiness of his left leg, requiring cane for ambulation.  He is still  pending home physical therapy evaluation.  He has had "small" seizures, consisting of few seconds of shaking of left leg, occurring less than daily.  Continues on Keppra and Dilantin.    Medications: Current Outpatient Medications on File Prior to Visit  Medication Sig Dispense Refill  . acetaminophen (TYLENOL) 500 MG tablet Take 1,000 mg by mouth daily as needed for mild pain.    Marland Kitchen aspirin EC 81 MG EC tablet Take 1 tablet (81 mg total) by mouth daily. 30 tablet 3  . atorvastatin (LIPITOR) 20 MG tablet Take 1 tablet (20 mg total) by mouth daily. 30 tablet 3  . clonazePAM (KLONOPIN) 0.5 MG tablet TAKE 1 TABLET(0.5 MG) BY MOUTH TWICE DAILY 60 tablet 3  . cyclobenzaprine (FLEXERIL) 10 MG tablet Take 1 tablet (10 mg total) by mouth 2 (two) times daily as needed for muscle spasms. 60 tablet 3  . FIBER ADULT GUMMIES PO Take 2 tablets by mouth daily.     Marland Kitchen gabapentin (NEURONTIN) 300 MG capsule Take 1 capsule (300 mg total) by mouth 2 (two) times daily. 60 capsule 3  . ibuprofen (ADVIL) 600 MG tablet Take 600 mg by mouth every 6 (six) hours as needed for moderate pain.     . Lacosamide 100 MG TABS Take 2 tablets (200 mg total) by mouth in the morning and at bedtime. 60 tablet 3  . lamoTRIgine (LAMICTAL) 100 MG tablet Take 2 tablets (200 mg total) by mouth 2 (two) times daily. 120 tablet 2  . LORazepam (ATIVAN) 1 MG tablet Take 1 tablet (1 mg total) by mouth every 8 (eight) hours as needed for seizure (for breakthrough seizures). 10 tablet 0  .  mirtazapine (REMERON) 15 MG tablet Take 15 mg by mouth at bedtime as needed (Sleep).     Marland Kitchen omeprazole (PRILOSEC) 20 MG capsule Take 1 capsule (20 mg total) by mouth daily. 30 capsule 1  . ondansetron (ZOFRAN-ODT) 4 MG disintegrating tablet DISSOLVE 1 TABLET(4 MG) ON THE TONGUE EVERY 8 HOURS AS NEEDED FOR NAUSEA OR VOMITING (Patient taking differently: Take 4 mg by mouth every 8 (eight) hours as needed for nausea or vomiting. ) 30 tablet 0  . vitamin C (ASCORBIC  ACID) 500 MG tablet Take 500 mg by mouth daily.     No current facility-administered medications on file prior to visit.    Allergies: No Known Allergies Past Medical History:  Past Medical History:  Diagnosis Date  . Brain tumor ([facility]) 02/20/2013   brain tumor removed in March 2014, Dr Donald Pore  . Diffuse idiopathic skeletal hyperostosis 06/05/2013  . Dizziness   . Enlarged prostate   . Headache(784.0)    scattered  . 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[place]) 03/07/2019  . Meningioma ([place])   . Meningioma, recurrent of brain ([place]) 01/31/2019  . Neuropathy 12/05/2019  . Seizures ([place])    11/27/19 last sz 1 wk ago   Past Surgical History:  Past Surgical History:  Procedure Laterality Date  . APPLICATION OF CRANIAL NAVIGATION N/A 01/10/2019   Procedure: APPLICATION OF CRANIAL NAVIGATION;  Surgeon: Erline Levine, MD;  Location: [facility];  Service: Neurosurgery;  Laterality: N/A;  . CATARACT EXTRACTION Right   . CRANIOTOMY Right 11/06/2012   Procedure: CRANIOTOMY TUMOR EXCISION;  Surgeon: Erline Levine, MD;  Location: [facility];  Service: Neurosurgery;  Laterality: Right;  Right Parasagittal craniotomy for meningioma with Stealth  . CRANIOTOMY Right 01/10/2019   Procedure: Right Parasagittal Craniotomy for Tumor;  Surgeon: Erline Levine, MD;  Location: [facility];  Service: Neurosurgery;  Laterality: Right;  Right parasagittal craniotomy for tumor  . ESOPHAGOGASTRODUODENOSCOPY    . right knee arthroscopy     Social History:  Social History   Socioeconomic History  . Marital status: Married    Spouse name: Not on file  . Number of children: 2  . Years of education: Not on file  . Highest education level: Not on file  Occupational History  . Not on file  Tobacco Use  . Smoking status: Former Smoker    Types: Cigarettes    Quit date: 01/29/2019    Years since quitting: 0.9  . Smokeless tobacco: Never Used  . Tobacco comment: hasn't smoked in a week  Substance and Sexual Activity    . Alcohol use: Yes    Alcohol/week: 8.0 standard drinks    Types: 6 Cans of beer, 2 Shots of liquor per week    Comment: occasionally, 11/27/19 none  . Drug use: No  . Sexual activity: Yes  Other Topics Concern  . Not on file  Social History Narrative   Lives with wife   Caffeine- soda occass   Social Determinants of Health   Financial Resource Strain:   . Difficulty of Paying Living Expenses:   Food Insecurity:   . Worried About Charity fundraiser in the Last Year:   . Arboriculturist in the Last Year:   Transportation Needs: No Transportation Needs  . Lack of Transportation (Medical): No  . Lack of Transportation (Non-Medical): No  Physical Activity:   . Days of Exercise per Week:   . Minutes of Exercise per Session:   Stress:   .  Feeling of Stress :   Social Connections:   . Frequency of Communication with Friends and Family:   . Frequency of Social Gatherings with Friends and Family:   . Attends Religious Services:   . Active Member of Clubs or Organizations:   . Attends Archivist Meetings:   Marland Kitchen Marital Status:   Intimate Partner Violence:   . Fear of Current or Ex-Partner:   . Emotionally Abused:   Marland Kitchen Physically Abused:   . Sexually Abused:    Family History:  Family History  Problem Relation Age of Onset  . Hypertension Mother   . Hypertension Father   . CAD Father   . CAD Brother     Review of Systems: Constitutional: Denies fevers, chills or abnormal weight loss Eyes: Denies blurriness of vision Ears, nose, mouth, throat, and face: Denies mucositis or sore throat Respiratory: Denies cough, dyspnea or wheezes Cardiovascular: Denies palpitation, chest discomfort or lower extremity swelling Gastrointestinal:  Denies nausea, constipation, diarrhea GU: Denies dysuria or incontinence Skin: Denies abnormal skin rashes Neurological: Per HPI Musculoskeletal: Denies joint pain, back or neck discomfort. No decrease in ROM Behavioral/Psych: Denies  anxiety, disturbance in thought content, and mood instability  Physical Exam: Vitals:   12/26/19 0933  BP: 134/87  Pulse: 84  Resp: 18  Temp: 98.2 F (36.8 C)  SpO2: 97%   KPS: 80. General: Alert, cooperative, pleasant, in no acute distress Head: Craniotomy scar noted, dry and intact. EENT: No conjunctival injection or scleral icterus. Oral mucosa moist Lungs: Resp effort normal Cardiac: Regular rate and rhythm Abdomen: Soft, non-distended abdomen Skin: No rashes cyanosis or petechiae. Extremities: No clubbing or edema  Neurologic Exam: Mental Status: Awake, alert, attentive to examiner. Oriented to self and environment. Language is fluent with intact comprehension.  Cranial Nerves: Visual acuity is grossly normal. Visual fields are full. Extra-ocular movements intact. No ptosis. Face is symmetric, tongue midline. Motor: Tone and bulk are normal.Power is impaired in left leg, 4+/5 distally. Reflexes are symmetric, no pathologic reflexes present. Impaired heel to shin left leg Sensory: Stocking sensory loss Gait: Independent  Labs: I have reviewed the data as listed    Component Value Date/Time   NA 141 12/10/2019 1902   K 4.2 12/10/2019 1902   CL 106 12/10/2019 1902   CO2 24 12/10/2019 1901   GLUCOSE 109 (H) 12/10/2019 1902   BUN 17 12/10/2019 1902   CREATININE 0.80 12/10/2019 1902   CALCIUM 9.6 12/10/2019 1901   PROT 7.5 12/10/2019 1901   ALBUMIN 4.6 12/10/2019 1901   AST 27 12/10/2019 1901   ALT 37 12/10/2019 1901   ALKPHOS 84 12/10/2019 1901   BILITOT 0.4 12/10/2019 1901   GFRNONAA >60 12/10/2019 1901   GFRAA >60 12/10/2019 1901   Lab Results  Component Value Date   WBC 7.5 12/10/2019   NEUTROABS 4.1 12/10/2019   HGB 16.7 12/10/2019   HCT 49.0 12/10/2019   MCV 91.3 12/10/2019   PLT 247 12/10/2019    Assessment/Plan 1. Meningioma, recurrent of brain ([place])  2. Seizures [facility])  Joshua [name] presents today with continued breakthrough focal seizures.  He  is well compliant with AEDs and has no provoking factors.  Seizures are minimally intrusive into daily life, functional status.    For now, will con't current regimen; LMG 200mg  BID, Vimpat 200mg  BID, KLP 0.5mg  BID.  We encouraged him to continue to seek out counseling for anxiety regarding his health problems.  We will provide #4 tablets of ativan for  breakthrough seizures although he should be less reliant on it because of lack of epileptic morbidity in the past year.  He may Gabapentin 300mg  BID although neuropathic symptoms have improved considerably.  We appreciate the opportunity to participate in the care of Joshua [name].  We will give him a call on Thursday to go over lab results.  All questions were answered. The patient knows to call the clinic with any problems, questions or concerns. No barriers to learning were detected.    The total time spent in the encounter was 30 minutes and more than 50% was on counseling and review of test results   [name], MD Medical Director of Neuro-Oncology [facility] at [place] 12/26/19 3:04 PM

## 2019-12-27 ENCOUNTER — Ambulatory Visit: Payer: Medicaid Other

## 2019-12-27 ENCOUNTER — Telehealth: Payer: Self-pay | Admitting: Internal Medicine

## 2019-12-27 NOTE — Telephone Encounter (Signed)
Scheduled appt per 6/10 los.  Was not able to reach pt.  Pt will get a print out at their next scheduled appt.

## 2019-12-30 ENCOUNTER — Encounter: Payer: Self-pay | Admitting: Genetic Counselor

## 2019-12-30 ENCOUNTER — Telehealth: Payer: Self-pay | Admitting: *Deleted

## 2019-12-30 NOTE — Telephone Encounter (Signed)
[facility]  Clinical Social Work received phone call from patient stating he is now interested in receiving counseling to help manage anxiety associated with health condition, living situation, and family conflict.  CSW will explore options and follow up with patient.  CSW made 1x attempt to follow up.  Patient previously stated he does not check his voicemails, CSW will re attempt.   Gwinda Maine, LCSW  Clinical Social Worker [facility]

## 2019-12-31 DIAGNOSIS — N430 Encysted hydrocele: Secondary | ICD-10-CM | POA: Diagnosis not present

## 2019-12-31 DIAGNOSIS — N471 Phimosis: Secondary | ICD-10-CM | POA: Diagnosis not present

## 2020-01-03 ENCOUNTER — Ambulatory Visit: Payer: Medicaid Other | Attending: Internal Medicine

## 2020-01-03 ENCOUNTER — Other Ambulatory Visit: Payer: Self-pay

## 2020-01-03 DIAGNOSIS — R2689 Other abnormalities of gait and mobility: Secondary | ICD-10-CM | POA: Diagnosis not present

## 2020-01-03 DIAGNOSIS — M6281 Muscle weakness (generalized): Secondary | ICD-10-CM

## 2020-01-03 NOTE — Therapy (Signed)
[facility] [address] [place], [place], [address] Phone: [phone]   Fax:  [phone]  Physical Therapy Treatment  Patient Details  Name: Joshua [name] MRN: [id number] Date of Birth: 20-Oct-1960 Referring Provider (PT): Cecil Cobbs   Encounter Date: 01/03/2020   PT End of Session - 01/03/20 0720    Visit Number 16    Number of Visits 28    Date for PT Re-Evaluation 01/30/20   POC for 9 weeks, Cert for 90 days   Authorization Type Medicaid 3 visits 06/03/11/02/2019, 12/10-12/30/20 6 units, 1/7-1/27/2021 3 visits, 1/29-3/4 10 visits, 3 visits 3/8-3/28, 3 visits 4/16-5/6, 4 visits 5/7-6/3, 4 visits 6/11 -7/8    Authorization - Visit Number 1    Authorization - Number of Visits 4    PT Start Time 0717    PT Stop Time 0800    PT Time Calculation (min) 43 min    Equipment Utilized During Treatment Gait belt    Activity Tolerance Patient tolerated treatment well;No increased pain    Behavior During Therapy WFL for tasks assessed/performed           Past Medical History:  Diagnosis Date  . Brain tumor ([place]) 02/20/2013   brain tumor removed in March 2014, Dr Donald Pore  . Diffuse idiopathic skeletal hyperostosis 06/05/2013  . Dizziness   . Enlarged prostate   . Headache(784.0)    scattered  . 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[facility]) 03/07/2019  . Meningioma ([place])   . Meningioma, recurrent of brain ([facility]) 01/31/2019  . Neuropathy 12/05/2019  . Seizures ([place])    11/27/19 last sz 1 wk ago    Past Surgical History:  Procedure Laterality Date  . APPLICATION OF CRANIAL NAVIGATION N/A 01/10/2019   Procedure: APPLICATION OF CRANIAL NAVIGATION;  Surgeon: Erline Levine, MD;  Location: [facility];  Service: Neurosurgery;  Laterality: N/A;  . CATARACT EXTRACTION Right   . CRANIOTOMY Right 11/06/2012   Procedure: CRANIOTOMY TUMOR EXCISION;  Surgeon: Erline Levine, MD;  Location: [facility];  Service: Neurosurgery;   Laterality: Right;  Right Parasagittal craniotomy for meningioma with Stealth  . CRANIOTOMY Right 01/10/2019   Procedure: Right Parasagittal Craniotomy for Tumor;  Surgeon: Erline Levine, MD;  Location: [facility];  Service: Neurosurgery;  Laterality: Right;  Right parasagittal craniotomy for tumor  . ESOPHAGOGASTRODUODENOSCOPY    . right knee arthroscopy      There were no vitals filed for this visit.   Subjective Assessment - 01/03/20 0721    Subjective Pt reports that he had another slight seizure this morning. Dr. Mickeal Skinner told him he wants to let the seizure just go through and not be so reliant on the anxiety medication so not prescribing anymore. Pt reports symptoms this morning resided in about a minute and he was surprised how quick it passed. Low back started bothering him last week when he was walking more bent over with some weights.    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 History of right THA on 2019.    Patient Stated Goals Pt would like to be able to elimate his cane use.    Currently in Pain? Yes    Pain Score 7     Pain Location Back    Pain Orientation Lower;Right    Pain Descriptors / Indicators --   grabbing   Pain Type Acute pain    Pain Onset  More than a month ago    Pain Frequency Intermittent    Aggravating Factors  twisting to left                             [facility] Adult PT Treatment/Exercise - 01/03/20 0726      Transfers   Transfers Sit to Stand;Stand to Sit    Sit to Stand 6: Modified independent (Device/Increase time)    Stand to Sit 6: Modified independent (Device/Increase time)      Ambulation/Gait   Ambulation/Gait Yes    Ambulation/Gait Assistance 4: Min guard    Ambulation/Gait Assistance Details Pt was cued to try to stay more upright as tends to lean forward especially as goes on. Also cued to  try to increase heel strike and foot clearance on left. Added in gait with speed changes on command and stop/go to try to work on patient not leaning forward and staying more upright. CGA for safety. With initially stopping, pt's weight shifted to toes. Had him pause until able to get heels on floor and more upright posture. The anterior lean seemed worse as went further with gait but did better fixing today when stopped.    Ambulation Distance (Feet) 475 Feet    Assistive device None    Gait Pattern Step-through pattern;Decreased hip/knee flexion - left;Decreased dorsiflexion - left    Ambulation Surface Level;Indoor      Therapeutic Activites    Therapeutic Activities Other Therapeutic Activities    Other Therapeutic Activities Pt was cued to use log roll technique for in/out of bed as straining to lift legs in bed leaning back with some increased pain. Pt able to perform with cuing with increased time to rise.      Neuro Re-ed    Neuro Re-ed Details  In // bars on rockerboard: trying to maintain board level 30 sec x 2 without UE support CGA with occasional overcorrection posterior. Then standing with 1 UE support on bar with rocking board ant/post x 10. Then alternating taps on cone from board with 1 UE support CGA with verbal and tactile cues to tighten bottom during left stance and also tighten tummy for more upright posture.      Exercises   Exercises Other Exercises    Other Exercises  Stretching to low back in supine: passive trunk rotation x 5 to each side with 20 sec holds, right single knee to chest 20 sec x 5, gentle hip distraction on right through ankle grade 3, 3 bouts of 20 sec. Pt reports back a little better after                  PT Education - 01/03/20 0812    Education Details Added in weight shifting activity at counter. Discussed working on deep breathing exercises if he feels himself getting more anxious. Reports he has circle thing he watches on his TV to work on  this.    Person(s) Educated Patient    Methods Explanation;Demonstration;Handout    Comprehension Verbalized understanding;Returned demonstration            PT Short Term Goals - 12/13/19 0736      PT SHORT TERM GOAL #1   Title Pt will be independent with HEP to continue strength and balance gains on own.    Baseline Pt reports he has been working on [employer] and continues to walk for exercise.    Time 4  Period Weeks    Status Achieved    Target Date 11/29/19      PT SHORT TERM GOAL #2   Title Pt will reduce TUG to <10 seconds w/ AD to demonstrate improved ambulation and further reduce risk for falls.    Baseline 11.81 seconds w/o AD, 10.92 seconds w/ AD, 11 sec without AD on 12/13/19    Time 4    Period Weeks    Status Not Met    Target Date 11/29/19             PT Long Term Goals - 01/03/20 0814      PT LONG TERM GOAL #1   Title Pt will increase FGA from 21/30 to >23/30 for improved balance and gait safety.    Baseline 21/30 on 09/13/19, 18/30 on 4/16, 16/30 on 12/13/19    Time 9    Period Weeks    Status Not Met    Target Date 01/23/20      PT LONG TERM GOAL #2   Title Pt will ambulate >700' on varied surfaces independently without AD for improved community mobility.    Baseline Pt ambulated supervision on level surfaces without AD. On outdoor surfaces assistance varied from supervision up to mod assist as patient started to have severe anterior lean without AD. Currently using SPC or rollator when has to go longer distances.    Time 9    Period Weeks    Status Not Met    Target Date 01/23/20      PT LONG TERM GOAL #3   Title Pt will be independent with progressive HEP to continue strength and balance gains on own.    Baseline Pt reports his HEP has been going well.    Time 9    Period Weeks    Status Achieved                 Plan - 01/03/20 0815    Clinical Impression Statement PT continued to focus more on balance and trying to decreasing anterior  lean with gait. Pt was able to correct more today than last visit but still tends to shift weight to toes more with gait with poor heel strike.    Examination-Activity Limitations Locomotion Level;Stairs;Dressing    Examination-Participation Restrictions Community Activity    Stability/Clinical Decision Making Evolving/Moderate complexity    Rehab Potential Good    PT Frequency 2x / week    PT Duration Other (comment)   9 Weeks   PT Treatment/Interventions ADLs/Self Care Home Management;DME Instruction;Gait training;Stair training;Functional mobility training;Therapeutic activities;Patient/family education;Neuromuscular re-education;Balance training;Therapeutic exercise;Orthotic Fit/Training;Manual techniques;Passive range of motion;Aquatic Therapy    PT Next Visit Plan Balance exercises working on controlling weight shift as has been leaning foward more. Continue gait training working on improved toe off and left hip flexion. Try BWS over treadmill again? Used XL harness. Strengthening and Stretching exercises    Consulted and Agree with Plan of Care Patient           Patient will benefit from skilled therapeutic intervention in order to improve the following deficits and impairments:  Abnormal gait, Decreased activity tolerance, Decreased balance, Difficulty walking, Decreased strength, Impaired sensation, Postural dysfunction, Impaired flexibility, Decreased mobility, Decreased range of motion  Visit Diagnosis: Other abnormalities of gait and mobility  Muscle weakness (generalized)     Problem List Patient Active Problem List   Diagnosis Date Noted  . Neuropathy 12/05/2019  . 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[place]) 03/07/2019  .  Seizures ([place]) 01/31/2019  . Meningioma, recurrent of brain ([place]) 01/31/2019  . Meningioma ([place]) 01/03/2019  . Brain tumor ([place]) 12/27/2018  . Diffuse idiopathic skeletal hyperostosis 06/05/2013    Electa Sniff, PT, DPT, NCS 01/03/2020, 8:19  AM  [facility] [address] [place], [place], [address] Phone: [phone]   Fax:  [phone]  Name: IVOR KISHI MRN: [id number] Date of Birth: 18-Oct-1960

## 2020-01-03 NOTE — Patient Instructions (Signed)
Access Code: [email or web address] URL: [email or web address] Date: 01/03/2020 Prepared by: Cherly Anderson  Exercises Tandem Stance - 2 x daily - 7 x weekly - 3 reps - 1 sets - 30 sec hold Walking March - 1 x daily - 7 x weekly - 3 reps - 1 sets Tandem Walking with Counter Support - 2 x daily - 7 x weekly - 3 reps - 1 sets Side Stepping with Counter Support - 2 x daily - 7 x weekly - 3 reps - 1 sets Romberg Stance on Foam Pad - 2 x daily - 7 x weekly - 3 reps - 1 sets - 30 sec hold Romberg Stance on Foam Pad with Head Rotation - 2 x daily - 7 x weekly - 10 reps - 1 sets Standing Balance with Eyes Closed on Foam - 2 x daily - 7 x weekly - 3 reps - 1 sets - 30 sec hold Mini Squat with Counter Support - 2 x daily - 7 x weekly - 10 reps - 2 sets Active assist ankle DF then resisted PF - 2 x daily - 7 x weekly - 10 reps - 3 sets Ankle Inversion Eversion Towel Slide - 1 x daily - 7 x weekly - 10 reps - 3 sets Standing Hip Extension with Ankle Weight - 1 x daily - 5 x weekly - 10 reps - 2 sets Standing Alternating Knee Flexion with Ankle Weights - 1 x daily - 5 x weekly - 10 reps - 2 sets Heel rises with counter support - 2 x daily - 7 x weekly - 10 reps - 3 sets Toe Walking with Counter Support - 2 x daily - 7 x weekly - 3-4 reps - 1 sets Seated Hamstring Stretch - 1 x daily - 7 x weekly - 1 sets - 3 reps - 30 hold Standing Gastroc Stretch at Counter - 1 x daily - 7 x weekly - 1 sets - 3 reps - 30 hold Modified Thomas Stretch - 2 x daily - 7 x weekly - 1 sets - 4 reps - 30 sec-1 min hold Forward Backward Weight Shift with Counter Support - 1 x daily - 7 x weekly - 3 sets - 10 reps

## 2020-01-06 ENCOUNTER — Inpatient Hospital Stay: Payer: Medicaid Other | Admitting: Internal Medicine

## 2020-01-07 ENCOUNTER — Other Ambulatory Visit: Payer: Self-pay

## 2020-01-07 ENCOUNTER — Encounter (HOSPITAL_COMMUNITY): Payer: Self-pay | Admitting: Emergency Medicine

## 2020-01-07 ENCOUNTER — Emergency Department (HOSPITAL_COMMUNITY)
Admission: EM | Admit: 2020-01-07 | Discharge: 2020-01-08 | Disposition: A | Discharge status: Home / Self Care | Payer: Medicaid Other | Attending: Emergency Medicine | Admitting: Emergency Medicine

## 2020-01-07 ENCOUNTER — Emergency Department (HOSPITAL_COMMUNITY): Payer: Medicaid Other

## 2020-01-07 DIAGNOSIS — Z5321 Procedure and treatment not carried out due to patient leaving prior to being seen by health care provider: Secondary | ICD-10-CM | POA: Diagnosis not present

## 2020-01-07 DIAGNOSIS — R0602 Shortness of breath: Secondary | ICD-10-CM | POA: Diagnosis not present

## 2020-01-07 DIAGNOSIS — R002 Palpitations: Secondary | ICD-10-CM | POA: Diagnosis not present

## 2020-01-07 LAB — BASIC METABOLIC PANEL
Anion gap: 9 (ref 5–15)
BUN: 10 mg/dL (ref 6–20)
CO2: 27 mmol/L (ref 22–32)
Calcium: 9 mg/dL (ref 8.9–10.3)
Chloride: 105 mmol/L (ref 98–111)
Creatinine, Ser: 0.85 mg/dL (ref 0.61–1.24)
GFR calc Af Amer: >60 mL/min (ref >60)
GFR calc non Af Amer: >60 mL/min (ref >60)
Glucose, Bld: 113 mg/dL — ABNORMAL HIGH (ref 70–99)
Potassium: 3.8 mmol/L (ref 3.5–5.1)
Sodium: 141 mmol/L (ref 135–145)

## 2020-01-07 LAB — CBC
HCT: 44 % (ref 39.0–52.0)
Hemoglobin: 15 g/dL (ref 13.0–17.0)
MCH: 31.6 pg (ref 26.0–34.0)
MCHC: 34.1 g/dL (ref 30.0–36.0)
MCV: 92.8 fL (ref 80.0–100.0)
Platelets: 242 10*3/uL (ref 150–400)
RBC: 4.74 MIL/uL (ref 4.22–5.81)
RDW: 13 % (ref 11.5–15.5)
WBC: 8.2 10*3/uL (ref 4.0–10.5)
nRBC: 0 % (ref 0.0–0.2)

## 2020-01-07 LAB — TROPONIN I (HIGH SENSITIVITY): Troponin I (High Sensitivity): 4 ng/L (ref <18)

## 2020-01-07 MED ORDER — SODIUM CHLORIDE 0.9% FLUSH: 3.0000 mL | Freq: Once | INTRAVENOUS | Status: DC

## 2020-01-07 NOTE — ED Triage Notes (Signed)
Pt reports that had a stress test recently and is wearing a heart monitor. Earlier had heart palpitations and feels SOB. Takes daily ASA 81mg . Denies pains.

## 2020-01-08 ENCOUNTER — Other Ambulatory Visit: Payer: Self-pay

## 2020-01-08 ENCOUNTER — Ambulatory Visit: Payer: Medicaid Other | Attending: Family Medicine | Admitting: Family Medicine

## 2020-01-08 ENCOUNTER — Encounter: Payer: Self-pay | Admitting: Family Medicine

## 2020-01-08 DIAGNOSIS — K529 Noninfective gastroenteritis and colitis, unspecified: Secondary | ICD-10-CM

## 2020-01-08 NOTE — Progress Notes (Signed)
Virtual Visit via Telephone Note  I connected with Joshua [name], on 01/08/2020 at 2:51 PM by telephone due to the COVID-19 pandemic and verified that I am speaking with the correct person using two identifiers.   Consent: I discussed the limitations, risks, security and privacy concerns of performing an evaluation and management service by telephone and the availability of in person appointments. I also discussed with the patient that there may be a patient responsible charge related to this service. The patient expressed understanding and agreed to proceed.   Location of Patient: Bathroom  Location of Provider: Clinic   Persons participating in Telemedicine visit: Ferrell Claiborne Farrington-CMA Dr. Margarita Rana     History of Present Illness: Joshua [name] is a4 year old right-handed male male with a history of meningioma status post resection in 2014 hospitalized at [facility] from 01/03/2019 through 01/14/2019 for recurrent meningioma status post resection(the last of which was in 12/2018), seizureshere foran acute visit.  He has had diarrhea and has been up all night due to this. Symptoms have been present since yesterday. He has no nausea, vomiting, fever but does have abdominal pain and does not recall ingesting any fast food.  He denies presence of blood in his bowel movement. Frequency is up to 8 times per day and stool is described as loose.   Past Medical History:  Diagnosis Date  . Brain tumor ([place]) 02/20/2013   brain tumor removed in March 2014, Dr Donald Pore  . Diffuse idiopathic skeletal hyperostosis 06/05/2013  . Dizziness   . Enlarged prostate   . Headache(784.0)    scattered  . 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[place]) 03/07/2019  . Meningioma ([place])   . Meningioma, recurrent of brain ([place]) 01/31/2019  . Neuropathy 12/05/2019  . Seizures ([place])    11/27/19 last sz 1 wk ago  . Stroke [facility])    No Known Allergies  Current Outpatient  Medications on File Prior to Visit  Medication Sig Dispense Refill  . acetaminophen (TYLENOL) 500 MG tablet Take 1,000 mg by mouth daily as needed for mild pain.    Marland Kitchen aspirin EC 81 MG EC tablet Take 1 tablet (81 mg total) by mouth daily. 30 tablet 3  . atorvastatin (LIPITOR) 20 MG tablet Take 1 tablet (20 mg total) by mouth daily. 30 tablet 3  . clonazePAM (KLONOPIN) 0.5 MG tablet TAKE 1 TABLET(0.5 MG) BY MOUTH TWICE DAILY 60 tablet 3  . cyclobenzaprine (FLEXERIL) 10 MG tablet Take 1 tablet (10 mg total) by mouth 2 (two) times daily as needed for muscle spasms. 60 tablet 3  . FIBER ADULT GUMMIES PO Take 2 tablets by mouth daily.     Marland Kitchen gabapentin (NEURONTIN) 300 MG capsule Take 1 capsule (300 mg total) by mouth 2 (two) times daily. 60 capsule 3  . ibuprofen (ADVIL) 600 MG tablet Take 600 mg by mouth every 6 (six) hours as needed for moderate pain.     . Lacosamide 100 MG TABS Take 2 tablets (200 mg total) by mouth in the morning and at bedtime. 60 tablet 3  . lamoTRIgine (LAMICTAL) 100 MG tablet Take 2 tablets (200 mg total) by mouth 2 (two) times daily. 120 tablet 2  . LORazepam (ATIVAN) 1 MG tablet Take 1 tablet (1 mg total) by mouth every 8 (eight) hours as needed for seizure (for breakthrough seizures). 4 tablet 0  . mirtazapine (REMERON) 15 MG tablet Take 15 mg by mouth at bedtime as needed (Sleep).     Marland Kitchen  omeprazole (PRILOSEC) 20 MG capsule Take 1 capsule (20 mg total) by mouth daily. 30 capsule 1  . ondansetron (ZOFRAN-ODT) 4 MG disintegrating tablet DISSOLVE 1 TABLET(4 MG) ON THE TONGUE EVERY 8 HOURS AS NEEDED FOR NAUSEA OR VOMITING (Patient taking differently: Take 4 mg by mouth every 8 (eight) hours as needed for nausea or vomiting. ) 30 tablet 0  . vitamin C (ASCORBIC ACID) 500 MG tablet Take 500 mg by mouth daily.     No current facility-administered medications on file prior to visit.    Observations/Objective: Awake, alert, oriented x3 Not in acute distress  Assessment and  Plan: 1. Gastroenteritis Likely viral etiology Discussed BRAT diet, adequate hydration Also discussed warning signs of dehydration and advised to present to urgent care if he notices any of the   Follow Up Instructions: Keep previously scheduled appointment   I discussed the assessment and treatment plan with the patient. The patient was provided an opportunity to ask questions and all were answered. The patient agreed with the plan and demonstrated an understanding of the instructions.   The patient was advised to call back or seek an in-person evaluation if the symptoms worsen or if the condition fails to improve as anticipated.     I provided 11 minutes total of non-face-to-face time during this encounter including median intraservice time, reviewing previous notes, investigations, ordering medications, medical decision making, coordinating care and patient verbalized understanding at the end of the visit.     Charlott Rakes, MD, FAAFP. [facility], [place]   01/08/2020, 2:51 PM

## 2020-01-09 ENCOUNTER — Emergency Department
Admission: EM | Admit: 2020-01-09 | Discharge: 2020-01-09 | Disposition: A | Discharge status: Home / Self Care | Payer: Medicaid Other | Attending: Emergency Medicine | Admitting: Emergency Medicine

## 2020-01-09 ENCOUNTER — Telehealth: Payer: Self-pay | Admitting: *Deleted

## 2020-01-09 ENCOUNTER — Other Ambulatory Visit: Payer: Self-pay

## 2020-01-09 DIAGNOSIS — M6281 Muscle weakness (generalized): Secondary | ICD-10-CM | POA: Diagnosis not present

## 2020-01-09 DIAGNOSIS — R569 Unspecified convulsions: Secondary | ICD-10-CM | POA: Diagnosis not present

## 2020-01-09 DIAGNOSIS — R29810 Facial weakness: Secondary | ICD-10-CM | POA: Diagnosis not present

## 2020-01-09 DIAGNOSIS — R0902 Hypoxemia: Secondary | ICD-10-CM | POA: Diagnosis not present

## 2020-01-09 DIAGNOSIS — Z5321 Procedure and treatment not carried out due to patient leaving prior to being seen by health care provider: Secondary | ICD-10-CM | POA: Diagnosis not present

## 2020-01-09 DIAGNOSIS — R531 Weakness: Secondary | ICD-10-CM | POA: Diagnosis not present

## 2020-01-09 NOTE — ED Triage Notes (Signed)
First RN Note: pt presents to ED via [facility] from the side of the road. Per EMS pt with hx of brain tumors x 2, last removed June 2020. Per EMS pt with tingling/numbness to L arm that started at approx 1415 today. Per EMS no HA, no nausea, no dizziness, no blurred vision, EMS reports unsteady gait that is not different from baseline.   CBG 115, VSS, EKG WNL.

## 2020-01-09 NOTE — Telephone Encounter (Signed)
[place] Work  Clinical Social Work contacted patient by phone and provided information on [employer]- counseling support. Patient agreed to contact after this phone call to set up therapy.   Gwinda Maine, LCSW  Clinical Social Worker [facility]

## 2020-01-09 NOTE — ED Triage Notes (Signed)
Pt here via [facility] for left arm weakness that started around 1430 today while driving. Pt states that it felt like his arm went to sleep which has never happened before. No other symptoms noted per pt. Vitals WNL.

## 2020-01-10 ENCOUNTER — Telehealth: Payer: Self-pay | Admitting: Emergency Medicine

## 2020-01-10 ENCOUNTER — Ambulatory Visit: Payer: Medicaid Other

## 2020-01-10 DIAGNOSIS — Z1159 Encounter for screening for other viral diseases: Secondary | ICD-10-CM | POA: Diagnosis not present

## 2020-01-10 NOTE — Telephone Encounter (Signed)
Fax received from [employer] that they have not received a transmission from patient's monitor in 2 days.  Attempted to call patient to discuss. Left message to call back.

## 2020-01-10 NOTE — Telephone Encounter (Signed)
Called patient due to lwot to inquire about condition and follow up plans. Says he forgot that when he has the seizures from the brain tumor it can cause the tingling in an extremity.  Says he is okay.

## 2020-01-12 DIAGNOSIS — Z760 Encounter for issue of repeat prescription: Secondary | ICD-10-CM | POA: Diagnosis not present

## 2020-01-12 DIAGNOSIS — Z8669 Personal history of other diseases of the nervous system and sense organs: Secondary | ICD-10-CM | POA: Diagnosis not present

## 2020-01-14 DIAGNOSIS — K621 Rectal polyp: Secondary | ICD-10-CM | POA: Diagnosis not present

## 2020-01-14 DIAGNOSIS — K529 Noninfective gastroenteritis and colitis, unspecified: Secondary | ICD-10-CM | POA: Diagnosis not present

## 2020-01-14 DIAGNOSIS — K5289 Other specified noninfective gastroenteritis and colitis: Secondary | ICD-10-CM | POA: Diagnosis not present

## 2020-01-14 DIAGNOSIS — D120 Benign neoplasm of cecum: Secondary | ICD-10-CM | POA: Diagnosis not present

## 2020-01-14 DIAGNOSIS — K648 Other hemorrhoids: Secondary | ICD-10-CM | POA: Diagnosis not present

## 2020-01-14 DIAGNOSIS — K6289 Other specified diseases of anus and rectum: Secondary | ICD-10-CM | POA: Diagnosis not present

## 2020-01-14 DIAGNOSIS — D125 Benign neoplasm of sigmoid colon: Secondary | ICD-10-CM | POA: Diagnosis not present

## 2020-01-14 DIAGNOSIS — K635 Polyp of colon: Secondary | ICD-10-CM | POA: Diagnosis not present

## 2020-01-14 DIAGNOSIS — Z1211 Encounter for screening for malignant neoplasm of colon: Secondary | ICD-10-CM | POA: Diagnosis not present

## 2020-01-14 DIAGNOSIS — K6389 Other specified diseases of intestine: Secondary | ICD-10-CM | POA: Diagnosis not present

## 2020-01-14 DIAGNOSIS — K633 Ulcer of intestine: Secondary | ICD-10-CM | POA: Diagnosis not present

## 2020-01-14 DIAGNOSIS — D122 Benign neoplasm of ascending colon: Secondary | ICD-10-CM | POA: Diagnosis not present

## 2020-01-17 ENCOUNTER — Telehealth: Payer: Self-pay | Admitting: Cardiology

## 2020-01-17 ENCOUNTER — Ambulatory Visit: Payer: Medicaid Other | Attending: Internal Medicine

## 2020-01-17 ENCOUNTER — Telehealth: Payer: Self-pay

## 2020-01-17 ENCOUNTER — Telehealth: Payer: Self-pay | Admitting: Licensed Clinical Social Worker

## 2020-01-17 DIAGNOSIS — M6281 Muscle weakness (generalized): Secondary | ICD-10-CM | POA: Insufficient documentation

## 2020-01-17 DIAGNOSIS — R2689 Other abnormalities of gait and mobility: Secondary | ICD-10-CM | POA: Insufficient documentation

## 2020-01-17 NOTE — Telephone Encounter (Signed)
Patient calling stating his heart monitor was on until 01/10/2020 when he set it down at a store and forgot it. He states someone picked it up, but he was able to get it back. He states he has not put it back on since. He states he will be mailing it back, because the monitor company told him they need it sent by 01/23/2020.

## 2020-01-17 NOTE — Telephone Encounter (Signed)
PT tried to call pt to check on him as no showed again today. Unable to leave message on phone with no answer. Today was last scheduled visit and Medicaid auth up 01/23/20. Cherly Anderson, PT, DPT, NCS

## 2020-01-17 NOTE — Telephone Encounter (Signed)
Call placed to patient regarding [facility] referral. LCSW left message requesting a return call.  

## 2020-01-17 NOTE — Telephone Encounter (Signed)
I called pt to discuss his monitor and he stated that someone has stole the phone that goes with the monitor. He found out who had it and got it back from them and wanted to know what he needed to do. I advised him to put the device back on since it is registered to be worn through 01/22/20. He stated that he didn't want to wear it anymore and he was sending it back. [

## 2020-01-17 NOTE — Telephone Encounter (Signed)
Patient needs to be set up with someone that he can talk to in regards to his anxiety.  Can you please reach out to patient and assit with his needs.

## 2020-01-21 ENCOUNTER — Ambulatory Visit: Payer: Medicaid Other | Admitting: Family Medicine

## 2020-01-22 ENCOUNTER — Ambulatory Visit: Payer: Medicaid Other

## 2020-01-22 ENCOUNTER — Other Ambulatory Visit: Payer: Self-pay

## 2020-01-22 DIAGNOSIS — M6281 Muscle weakness (generalized): Secondary | ICD-10-CM | POA: Diagnosis not present

## 2020-01-22 DIAGNOSIS — R2689 Other abnormalities of gait and mobility: Secondary | ICD-10-CM

## 2020-01-22 NOTE — Therapy (Signed)
[facility] [address] [place], [place], [address] Phone: [phone]   Fax:  [phone]  Physical Therapy Treatment/ Discharge Summary  Patient Details  Name: Joshua [name] MRN: [id number] Date of Birth: 07-22-1960 Referring Provider (PT): Cecil Cobbs  PHYSICAL THERAPY DISCHARGE SUMMARY  Visits from Start of Care: 17  Current functional level related to goals / functional outcomes: See clinical impression for more information. Pt has plateaued with further progress at this time. He ambulates with SPC most of time and uses rollator for longer distances when walking for exercise.    Remaining deficits:  LLE weakness/tone, balance deficits.   Education / Equipment: HEP  Plan: Patient agrees to discharge.  Patient goals were not met. Patient is being discharged due to                                                     ???Pt has plateaued with further progress at this time??        Encounter Date: 01/22/2020   PT End of Session - 01/22/20 1709    Visit Number 17    Number of Visits 28    Date for PT Re-Evaluation 01/30/20   POC for 9 weeks, Cert for 90 days   Authorization Type Medicaid 3 visits 06/03/11/02/2019, 12/10-12/30/20 6 units, 1/7-1/27/2021 3 visits, 1/29-3/4 10 visits, 3 visits 3/8-3/28, 3 visits 4/16-5/6, 4 visits 5/7-6/3, 4 visits 6/11 -7/8    Authorization - Visit Number 2    Authorization - Number of Visits 4    PT Start Time 9150    PT Stop Time 1737   discharge visit so finished early   PT Time Calculation (min) 32 min    Equipment Utilized During Treatment Gait belt    Activity Tolerance Patient tolerated treatment well;No increased pain    Behavior During Therapy WFL for tasks assessed/performed           Past Medical History:  Diagnosis Date  . Brain tumor ([place]) 02/20/2013   brain tumor removed in March 2014, Dr Donald Pore  . Diffuse idiopathic skeletal hyperostosis 06/05/2013  .  Dizziness   . Enlarged prostate   . Headache(784.0)    scattered  . 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[place]) 03/07/2019  . Meningioma ([place])   . Meningioma, recurrent of brain ([place]) 01/31/2019  . Neuropathy 12/05/2019  . Seizures ([place])    11/27/19 last sz 1 wk ago  . Stroke [facility])     Past Surgical History:  Procedure Laterality Date  . APPLICATION OF CRANIAL NAVIGATION N/A 01/10/2019   Procedure: APPLICATION OF CRANIAL NAVIGATION;  Surgeon: Erline Levine, MD;  Location: [facility];  Service: Neurosurgery;  Laterality: N/A;  . CATARACT EXTRACTION Right   . CRANIOTOMY Right 11/06/2012   Procedure: CRANIOTOMY TUMOR EXCISION;  Surgeon: Erline Levine, MD;  Location: [facility];  Service: Neurosurgery;  Laterality: Right;  Right Parasagittal craniotomy for meningioma with Stealth  . CRANIOTOMY Right 01/10/2019   Procedure: Right Parasagittal Craniotomy for Tumor;  Surgeon: Erline Levine, MD;  Location: [facility];  Service: Neurosurgery;  Laterality: Right;  Right parasagittal craniotomy for tumor  . ESOPHAGOGASTRODUODENOSCOPY    . right knee arthroscopy      There were no vitals filed for this visit.   Subjective Assessment - 01/22/20 1707  Subjective Pt reports that he missed last couple appointments as he moved to [facility] and could not make the early appointments anymore. Pt reports that hands have been feeling uncomfortable and his back still feels tight. Pt also reports that the doctor will not give him anymore lorazepam to help with seizures and he is doing much better with seizures just letting them run course and passing quickly. Pt continues to use SPC most of time and has rollator for longer distances.    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 History of right THA on 2019.    Patient Stated Goals Pt would like to be able to elimate his cane use.     Currently in Pain? Yes    Pain Score 8     Pain Location Back    Pain Orientation Lower;Right    Pain Descriptors / Indicators Tightness    Pain Type Chronic pain    Pain Onset More than a month ago    Pain Frequency Intermittent              [facility] PT Assessment - 01/22/20 1713      Functional Gait  Assessment   Gait assessed  Yes    Gait Level Surface Walks 20 ft, slow speed, abnormal gait pattern, evidence for imbalance or deviates 10-15 in outside of the 12 in walkway width. Requires more than 7 sec to ambulate 20 ft.    Change in Gait Speed Able to change speed, demonstrates mild gait deviations, deviates 6-10 in outside of the 12 in walkway width, or no gait deviations, unable to achieve a major change in velocity, or uses a change in velocity, or uses an assistive device.    Gait with Horizontal Head Turns Performs head turns smoothly with slight change in gait velocity (eg, minor disruption to smooth gait path), deviates 6-10 in outside 12 in walkway width, or uses an assistive device.    Gait with Vertical Head Turns Performs task with slight change in gait velocity (eg, minor disruption to smooth gait path), deviates 6 - 10 in outside 12 in walkway width or uses assistive device    Gait and Pivot Turn Pivot turns safely within 3 sec and stops quickly with no loss of balance.    Step Over Obstacle Is able to step over one shoe box (4.5 in total height) but must slow down and adjust steps to clear box safely. May require verbal cueing.    Gait with Narrow Base of Support Ambulates less than 4 steps heel to toe or cannot perform without assistance.    Gait with Eyes Closed Walks 20 ft, slow speed, abnormal gait pattern, evidence for imbalance, deviates 10-15 in outside 12 in walkway width. Requires more than 9 sec to ambulate 20 ft.    Ambulating Backwards Walks 20 ft, uses assistive device, slower speed, mild gait deviations, deviates 6-10 in outside 12 in walkway width.     Steps Alternating feet, must use rail.    Total Score 16                         [facility] Adult PT Treatment/Exercise - 01/22/20 1713      Transfers   Transfers Sit to Stand;Stand to Sit    Sit to Stand 7: Independent    Stand to Sit 7: Independent      Ambulation/Gait  Ambulation/Gait Yes    Ambulation/Gait Assistance 5: Supervision    Ambulation/Gait Assistance Details around in clinic during activities    Assistive device None    Gait Pattern Step-through pattern;Decreased hip/knee flexion - left;Decreased dorsiflexion - left    Ambulation Surface Level;Indoor      Standardized Balance Assessment   Standardized Balance Assessment Timed Up and Go Test      Timed Up and Go Test   TUG Normal TUG    Normal TUG (seconds) 11   11 and 11 sec                 PT Education - 01/22/20 1956    Education Details Pt denies any questions on HEP. Discussed discharge at this time to save visits in case needs more later in year.    Person(s) Educated Patient    Methods Explanation    Comprehension Verbalized understanding            PT Short Term Goals - 01/22/20 1728      PT SHORT TERM GOAL #1   Title Pt will be independent with HEP to continue strength and balance gains on own.    Baseline Pt reports he has been working on [employer] and continues to walk for exercise.    Time 4    Period Weeks    Status Achieved    Target Date 11/29/19      PT SHORT TERM GOAL #2   Title Pt will reduce TUG to <10 seconds w/ AD to demonstrate improved ambulation and further reduce risk for falls.    Baseline 11.81 seconds w/o AD, 10.92 seconds w/ AD, 11 sec without AD on 12/13/19, TUG=11 sec on 01/22/20    Time 4    Period Weeks    Status Not Met    Target Date 11/29/19             PT Long Term Goals - 01/22/20 1727      PT LONG TERM GOAL #1   Title Pt will increase FGA from 21/30 to >23/30 for improved balance and gait safety.    Baseline 21/30 on 09/13/19, 18/30 on 4/16,  16/30 on 12/13/19, 16/30 on 01/22/20    Time 9    Period Weeks    Status Not Met      PT LONG TERM GOAL #2   Title Pt will ambulate >700' on varied surfaces independently without AD for improved community mobility.    Baseline Pt ambulated supervision on level surfaces without AD. On outdoor surfaces assistance varied from supervision up to min assist. Currently using [facility] or rollator when has to go longer distances.    Time 9    Period Weeks    Status Not Met      PT LONG TERM GOAL #3   Title Pt will be independent with progressive HEP to continue strength and balance gains on own.    Baseline Pt reports his HEP has been going well.    Time 9    Period Weeks    Status Achieved                 Plan - 01/22/20 1958    Clinical Impression Statement PT reassessed goals today. Pt showed no change on both TUG and FGA scores. FGA score still indicates fall risk. Pt is able to ambulate supervision in clinic without AD but uses SPC when on own or rollator for longer gait distances. Still limited by tone in  LLE and is worse with longer distances as he fatigues. Pt has been instructed in HEP to continue to work on. PT discharging at this time as no recent changes on measures and want to conserve visits if therapy should be needed later in year.    Examination-Activity Limitations Locomotion Level;Stairs;Dressing    Examination-Participation Restrictions Community Activity    Stability/Clinical Decision Making Evolving/Moderate complexity    Rehab Potential Good    PT Frequency 2x / week    PT Duration Other (comment)   9 Weeks   PT Treatment/Interventions ADLs/Self Care Home Management;DME Instruction;Gait training;Stair training;Functional mobility training;Therapeutic activities;Patient/family education;Neuromuscular re-education;Balance training;Therapeutic exercise;Orthotic Fit/Training;Manual techniques;Passive range of motion;Aquatic Therapy    PT Next Visit Plan Discharged today     Consulted and Agree with Plan of Care Patient           Patient will benefit from skilled therapeutic intervention in order to improve the following deficits and impairments:  Abnormal gait, Decreased activity tolerance, Decreased balance, Difficulty walking, Decreased strength, Impaired sensation, Postural dysfunction, Impaired flexibility, Decreased mobility, Decreased range of motion  Visit Diagnosis: Other abnormalities of gait and mobility  Muscle weakness (generalized)     Problem List Patient Active Problem List   Diagnosis Date Noted  . Neuropathy 12/05/2019  . 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[place]) 03/07/2019  . Seizures ([place]) 01/31/2019  . Meningioma, recurrent of brain ([place]) 01/31/2019  . Meningioma ([place]) 01/03/2019  . Brain tumor ([place]) 12/27/2018  . Diffuse idiopathic skeletal hyperostosis 06/05/2013    Electa Sniff, PT, DPT, NCS 01/22/2020, 8:01 PM  [facility] [address] [place], [place], [address] Phone: [phone]   Fax:  [phone]  Name: Joshua [name] MRN: [id number] Date of Birth: 10/21/1960

## 2020-01-27 ENCOUNTER — Encounter (HOSPITAL_COMMUNITY): Payer: Self-pay

## 2020-01-27 ENCOUNTER — Other Ambulatory Visit: Payer: Self-pay

## 2020-01-27 ENCOUNTER — Emergency Department (HOSPITAL_COMMUNITY)
Admission: EM | Admit: 2020-01-27 | Discharge: 2020-01-28 | Disposition: A | Discharge status: Home / Self Care | Payer: Medicaid Other | Attending: Emergency Medicine | Admitting: Emergency Medicine

## 2020-01-27 DIAGNOSIS — R509 Fever, unspecified: Secondary | ICD-10-CM | POA: Insufficient documentation

## 2020-01-27 DIAGNOSIS — Z5321 Procedure and treatment not carried out due to patient leaving prior to being seen by health care provider: Secondary | ICD-10-CM | POA: Insufficient documentation

## 2020-01-27 DIAGNOSIS — R110 Nausea: Secondary | ICD-10-CM | POA: Diagnosis not present

## 2020-01-27 DIAGNOSIS — K529 Noninfective gastroenteritis and colitis, unspecified: Secondary | ICD-10-CM | POA: Diagnosis not present

## 2020-01-27 NOTE — ED Triage Notes (Signed)
Arrived POV from home. Patient reports nausea and temp of 99.8. Patient states that over past couple of days his roommate has not been able to taste anything. Patient reports he is fully vaccinated, but is unsure whether or not his roommate is vaccinated.

## 2020-01-28 NOTE — ED Notes (Signed)
Unable to be seen PT currently not in lobby

## 2020-02-03 ENCOUNTER — Other Ambulatory Visit: Payer: Self-pay

## 2020-02-03 ENCOUNTER — Ambulatory Visit: Payer: Medicaid Other | Admitting: Licensed Clinical Social Worker

## 2020-02-04 ENCOUNTER — Other Ambulatory Visit: Payer: Self-pay

## 2020-02-04 ENCOUNTER — Inpatient Hospital Stay: Payer: Medicaid Other | Attending: Internal Medicine | Admitting: Internal Medicine

## 2020-02-04 VITALS — BP 163/85 | HR 79 | Temp 98.1°F | Resp 18 | Ht 68.0 in | Wt 201.9 lb

## 2020-02-04 DIAGNOSIS — D320 Benign neoplasm of cerebral meninges: Secondary | ICD-10-CM | POA: Insufficient documentation

## 2020-02-04 DIAGNOSIS — R569 Unspecified convulsions: Secondary | ICD-10-CM | POA: Diagnosis not present

## 2020-02-04 DIAGNOSIS — Z79899 Other long term (current) drug therapy: Secondary | ICD-10-CM | POA: Diagnosis not present

## 2020-02-04 DIAGNOSIS — F419 Anxiety disorder, unspecified: Secondary | ICD-10-CM | POA: Insufficient documentation

## 2020-02-04 DIAGNOSIS — Z8673 Personal history of transient ischemic attack (TIA), and cerebral infarction without residual deficits: Secondary | ICD-10-CM | POA: Insufficient documentation

## 2020-02-04 DIAGNOSIS — G40109 Localization-related (focal) (partial) symptomatic epilepsy and epileptic syndromes with simple partial seizures, not intractable, without status epilepticus: Secondary | ICD-10-CM | POA: Insufficient documentation

## 2020-02-04 DIAGNOSIS — Z87891 Personal history of nicotine dependence: Secondary | ICD-10-CM | POA: Insufficient documentation

## 2020-02-04 DIAGNOSIS — R200 Anesthesia of skin: Secondary | ICD-10-CM | POA: Diagnosis not present

## 2020-02-04 MED ORDER — LAMOTRIGINE 100 MG PO TABS: 200.0000 mg | ORAL_TABLET | Freq: Two times a day (BID) | ORAL | 2 refills | Status: DC

## 2020-02-04 MED ORDER — LACOSAMIDE 100 MG PO TABS: 200.0000 mg | ORAL_TABLET | Freq: Two times a day (BID) | ORAL | 3 refills | Status: DC

## 2020-02-04 MED ORDER — ONDANSETRON 4 MG PO TBDP: 4.0000 mg | ORAL_TABLET | Freq: Three times a day (TID) | ORAL | 3 refills | Status: DC | PRN

## 2020-02-04 NOTE — Progress Notes (Signed)
[facility] [place], [place] [address] [phone]   Interval Evaluation  Date of Service: 02/04/20 Patient Name: Joshua [name] Patient MRN: [id number] Patient DOB: 03/02/1961 Provider: [name], MD  Identifying Statement:  Joshua [name] is a 59 y.o. 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 Vertell Limber (WHO I) 01/10/19: Repeat craniotomy, debulking resection by Dr. Vertell Limber after tumor recurrence, seizures.  Path demonstrates islands of anaplasia c/w grade II/III. 04/05/19: Completes post-operative IMRT with Dr. Lisbeth Renshaw  Interval History:  Joshua [name] presents today for follow up.  He does acknowledge recurrence of seizures several times per week, but they are all "sensory".  Typically involves brief period of numbness affecting left arm and leg, self limited, without post-ictal phase.  Not requiring ativan as we discussed, but still gets very anxious.  He otherwise maintains compliance with Vimpat, Lamictal, Klonopin.  Currently going through separation from his spouse, getting counseling.   H+P (02/05/19) Patient presents to review clinical course and care for his meningioma.  Initially he presented in 2014 with headache syndrome, was found to have tumor on imaging which was resected by Dr. Vertell Limber.  The patient was lost to follow up for ~5 years until he developed seizures, described as "twitching of left leg spreading to arm" this past month.  He required loads of Keppra and Dilantin to break events.  MRI demonstrated significant regrowth of the mass, and repeat craniotomy was performed on 01/10/19.  Small amount of residual tumor was visible on post-operative MRI.  Since surgery he has continued to experience numbness and clumsiness of his left leg, requiring cane for ambulation.  He is still pending home physical therapy  evaluation.  He has had "small" seizures, consisting of few seconds of shaking of left leg, occurring less than daily.  Continues on Keppra and Dilantin.    Medications: Current Outpatient Medications on File Prior to Visit  Medication Sig Dispense Refill  . acetaminophen (TYLENOL) 500 MG tablet Take 1,000 mg by mouth daily as needed for mild pain.    Marland Kitchen aspirin EC 81 MG EC tablet Take 1 tablet (81 mg total) by mouth daily. 30 tablet 3  . atorvastatin (LIPITOR) 20 MG tablet Take 1 tablet (20 mg total) by mouth daily. 30 tablet 3  . clonazePAM (KLONOPIN) 0.5 MG tablet TAKE 1 TABLET(0.5 MG) BY MOUTH TWICE DAILY 60 tablet 3  . cyclobenzaprine (FLEXERIL) 10 MG tablet Take 1 tablet (10 mg total) by mouth 2 (two) times daily as needed for muscle spasms. 60 tablet 3  . FIBER ADULT GUMMIES PO Take 2 tablets by mouth daily.     Marland Kitchen gabapentin (NEURONTIN) 300 MG capsule Take 1 capsule (300 mg total) by mouth 2 (two) times daily. 60 capsule 3  . ibuprofen (ADVIL) 600 MG tablet Take 600 mg by mouth every 6 (six) hours as needed for moderate pain.     . Lacosamide 100 MG TABS Take 2 tablets (200 mg total) by mouth in the morning and at bedtime. 60 tablet 3  . lamoTRIgine (LAMICTAL) 100 MG tablet Take 2 tablets (200 mg total) by mouth 2 (two) times daily. 120 tablet 2  . LORazepam (ATIVAN) 1 MG tablet Take 1 tablet (1 mg total) by mouth every 8 (eight) hours as needed for seizure (for breakthrough seizures). 4 tablet 0  . mirtazapine (REMERON) 15 MG tablet Take 15  mg by mouth at bedtime as needed (Sleep).     Marland Kitchen omeprazole (PRILOSEC) 20 MG capsule Take 1 capsule (20 mg total) by mouth daily. 30 capsule 1  . ondansetron (ZOFRAN-ODT) 4 MG disintegrating tablet DISSOLVE 1 TABLET(4 MG) ON THE TONGUE EVERY 8 HOURS AS NEEDED FOR NAUSEA OR VOMITING (Patient taking differently: Take 4 mg by mouth every 8 (eight) hours as needed for nausea or vomiting. ) 30 tablet 0  . vitamin C (ASCORBIC ACID) 500 MG tablet Take 500 mg by  mouth daily.     No current facility-administered medications on file prior to visit.    Allergies: No Known Allergies Past Medical History:  Past Medical History:  Diagnosis Date  . Brain tumor ([facility]) 02/20/2013   brain tumor removed in March 2014, Dr Donald Pore  . Diffuse idiopathic skeletal hyperostosis 06/05/2013  . Dizziness   . Enlarged prostate   . Headache(784.0)    scattered  . 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[facility]) 03/07/2019  . Meningioma ([facility])   . Meningioma, recurrent of brain ([facility]) 01/31/2019  . Neuropathy 12/05/2019  . Seizures ([place])    11/27/19 last sz 1 wk ago  . Stroke [facility])    Past Surgical History:  Past Surgical History:  Procedure Laterality Date  . APPLICATION OF CRANIAL NAVIGATION N/A 01/10/2019   Procedure: APPLICATION OF CRANIAL NAVIGATION;  Surgeon: Erline Levine, MD;  Location: [facility];  Service: Neurosurgery;  Laterality: N/A;  . CATARACT EXTRACTION Right   . CRANIOTOMY Right 11/06/2012   Procedure: CRANIOTOMY TUMOR EXCISION;  Surgeon: Erline Levine, MD;  Location: [facility];  Service: Neurosurgery;  Laterality: Right;  Right Parasagittal craniotomy for meningioma with [facility]  . CRANIOTOMY Right 01/10/2019   Procedure: Right Parasagittal Craniotomy for Tumor;  Surgeon: Erline Levine, MD;  Location: [facility];  Service: Neurosurgery;  Laterality: Right;  Right parasagittal craniotomy for tumor  . ESOPHAGOGASTRODUODENOSCOPY    . right knee arthroscopy     Social History:  Social History   Socioeconomic History  . Marital status: Married    Spouse name: Not on file  . Number of children: 2  . Years of education: Not on file  . Highest education level: Not on file  Occupational History  . Not on file  Tobacco Use  . Smoking status: Former Smoker    Types: Cigarettes    Quit date: 01/29/2019    Years since quitting: 1.0  . Smokeless tobacco: Never Used  . Tobacco comment: hasn't smoked in a week  Substance and Sexual Activity  . Alcohol use:  Yes    Alcohol/week: 8.0 standard drinks    Types: 6 Cans of beer, 2 Shots of liquor per week    Comment: occasionally, 11/27/19 none  . Drug use: No  . Sexual activity: Yes  Other Topics Concern  . Not on file  Social History Narrative   Lives with wife   Caffeine- soda occass   Social Determinants of Health   Financial Resource Strain:   . Difficulty of Paying Living Expenses:   Food Insecurity:   . Worried About Charity fundraiser in the Last Year:   . Arboriculturist in the Last Year:   Transportation Needs:   . Film/video editor (Medical):   Marland Kitchen Lack of Transportation (Non-Medical):   Physical Activity:   . Days of Exercise per Week:   . Minutes of Exercise per Session:   Stress:   . Feeling of Stress :  Social Connections:   . Frequency of Communication with Friends and Family:   . Frequency of Social Gatherings with Friends and Family:   . Attends Religious Services:   . Active Member of Clubs or Organizations:   . Attends Archivist Meetings:   Marland Kitchen Marital Status:   Intimate Partner Violence:   . Fear of Current or Ex-Partner:   . Emotionally Abused:   Marland Kitchen Physically Abused:   . Sexually Abused:    Family History:  Family History  Problem Relation Age of Onset  . Hypertension Mother   . Dementia Mother   . Hypertension Father   . CAD Father   . CAD Brother     Review of Systems: Constitutional: Denies fevers, chills or abnormal weight loss Eyes: Denies blurriness of vision Ears, nose, mouth, throat, and face: Denies mucositis or sore throat Respiratory: Denies cough, dyspnea or wheezes Cardiovascular: Denies palpitation, chest discomfort or lower extremity swelling Gastrointestinal:  Denies nausea, constipation, diarrhea GU: Denies dysuria or incontinence Skin: Denies abnormal skin rashes Neurological: Per HPI Musculoskeletal: Denies joint pain, back or neck discomfort. No decrease in ROM Behavioral/Psych: Denies anxiety, disturbance in  thought content, and mood instability  Physical Exam: Vitals:   02/04/20 0919  BP: (!) 163/85  Pulse: 79  Resp: 18  Temp: 98.1 F (36.7 C)  SpO2: 99%   KPS: 80. General: Alert, cooperative, pleasant, in no acute distress Head: Craniotomy scar noted, dry and intact. EENT: No conjunctival injection or scleral icterus. Oral mucosa moist Lungs: Resp effort normal Cardiac: Regular rate and rhythm Abdomen: Soft, non-distended abdomen Skin: No rashes cyanosis or petechiae. Extremities: No clubbing or edema  Neurologic Exam: Mental Status: Awake, alert, attentive to examiner. Oriented to self and environment. Language is fluent with intact comprehension.  Cranial Nerves: Visual acuity is grossly normal. Visual fields are full. Extra-ocular movements intact. No ptosis. Face is symmetric, tongue midline. Motor: Tone and bulk are normal.Power is impaired in left leg, 4+/5 distally. Reflexes are symmetric, no pathologic reflexes present. Impaired heel to shin left leg Sensory: Stocking sensory loss Gait: Independent  Labs: I have reviewed the data as listed    Component Value Date/Time   NA 141 01/07/2020 1248   K 3.8 01/07/2020 1248   CL 105 01/07/2020 1248   CO2 27 01/07/2020 1248   GLUCOSE 113 (H) 01/07/2020 1248   BUN 10 01/07/2020 1248   CREATININE 0.85 01/07/2020 1248   CALCIUM 9.0 01/07/2020 1248   PROT 7.5 12/10/2019 1901   ALBUMIN 4.6 12/10/2019 1901   AST 27 12/10/2019 1901   ALT 37 12/10/2019 1901   ALKPHOS 84 12/10/2019 1901   BILITOT 0.4 12/10/2019 1901   GFRNONAA >60 01/07/2020 1248   GFRAA >60 01/07/2020 1248   Lab Results  Component Value Date   WBC 8.2 01/07/2020   NEUTROABS 4.1 12/10/2019   HGB 15.0 01/07/2020   HCT 44.0 01/07/2020   MCV 92.8 01/07/2020   PLT 242 01/07/2020    Assessment/Plan 1. Meningioma, recurrent of brain ([place])  2. Seizures [facility])  Mr. Asche presents today with ongoing focal seizures, which are limited to sensory components  only at this time.  These seizures are minimally intrusive into daily life and do not limit functional status. He is well compliant with AED regimen.   For now, will con't LMG 200mg  BID, Vimpat 200mg  BID, KLP 0.5mg  BID.  He will continue to work on anxiety and recent stressors through his counselor.   We ask that  Joshua [name] return to clinic in 2 months following next brain MRI, or sooner as needed.  We appreciate the opportunity to participate in the care of ONOFRIO KLEMP.    All questions were answered. The patient knows to call the clinic with any problems, questions or concerns. No barriers to learning were detected.    I have spent a total of 30 minutes of face-to-face and non-face-to-face time, excluding clinical staff time, preparing to see patient, ordering tests and/or medications, counseling the patient, and care coordination    [name], MD Medical Director of Neuro-Oncology [facility] at [place] 02/04/20 9:19 AM

## 2020-02-05 ENCOUNTER — Telehealth: Payer: Self-pay | Admitting: Internal Medicine

## 2020-02-05 NOTE — Telephone Encounter (Signed)
Scheduled per 7/20 los. Unable to reach pt. Left voicemail with appt time and date.

## 2020-02-10 ENCOUNTER — Ambulatory Visit: Payer: Medicaid Other | Attending: Internal Medicine | Admitting: Licensed Clinical Social Worker

## 2020-02-10 ENCOUNTER — Emergency Department (HOSPITAL_COMMUNITY)
Admission: EM | Admit: 2020-02-10 | Discharge: 2020-02-11 | Discharge status: Against Medical Advice | Payer: Medicaid Other | Attending: Emergency Medicine | Admitting: Emergency Medicine

## 2020-02-10 ENCOUNTER — Other Ambulatory Visit: Payer: Self-pay

## 2020-02-10 ENCOUNTER — Telehealth: Payer: Self-pay | Admitting: Licensed Clinical Social Worker

## 2020-02-10 ENCOUNTER — Encounter (HOSPITAL_COMMUNITY): Payer: Self-pay | Admitting: Emergency Medicine

## 2020-02-10 DIAGNOSIS — W19XXXA Unspecified fall, initial encounter: Secondary | ICD-10-CM

## 2020-02-10 DIAGNOSIS — H538 Other visual disturbances: Secondary | ICD-10-CM

## 2020-02-10 DIAGNOSIS — Y939 Activity, unspecified: Secondary | ICD-10-CM | POA: Diagnosis not present

## 2020-02-10 DIAGNOSIS — D329 Benign neoplasm of meninges, unspecified: Secondary | ICD-10-CM | POA: Insufficient documentation

## 2020-02-10 DIAGNOSIS — Y999 Unspecified external cause status: Secondary | ICD-10-CM | POA: Insufficient documentation

## 2020-02-10 DIAGNOSIS — Y929 Unspecified place or not applicable: Secondary | ICD-10-CM | POA: Diagnosis not present

## 2020-02-10 DIAGNOSIS — J329 Chronic sinusitis, unspecified: Secondary | ICD-10-CM | POA: Diagnosis not present

## 2020-02-10 DIAGNOSIS — Z87891 Personal history of nicotine dependence: Secondary | ICD-10-CM | POA: Diagnosis not present

## 2020-02-10 DIAGNOSIS — S069X0A Unspecified intracranial injury without loss of consciousness, initial encounter: Secondary | ICD-10-CM | POA: Diagnosis present

## 2020-02-10 DIAGNOSIS — Z79899 Other long term (current) drug therapy: Secondary | ICD-10-CM | POA: Diagnosis not present

## 2020-02-10 DIAGNOSIS — S060X0A Concussion without loss of consciousness, initial encounter: Secondary | ICD-10-CM | POA: Diagnosis not present

## 2020-02-10 DIAGNOSIS — R000 Tachycardia, unspecified: Secondary | ICD-10-CM | POA: Diagnosis not present

## 2020-02-10 DIAGNOSIS — I8291 Chronic embolism and thrombosis of unspecified vein: Secondary | ICD-10-CM | POA: Diagnosis not present

## 2020-02-10 DIAGNOSIS — I619 Nontraumatic intracerebral hemorrhage, unspecified: Secondary | ICD-10-CM | POA: Diagnosis not present

## 2020-02-10 DIAGNOSIS — G08 Intracranial and intraspinal phlebitis and thrombophlebitis: Secondary | ICD-10-CM | POA: Diagnosis not present

## 2020-02-10 LAB — URINALYSIS, ROUTINE W REFLEX MICROSCOPIC
Bacteria, UA: NONE SEEN
Bilirubin Urine: NEGATIVE
Glucose, UA: NEGATIVE mg/dL
Hgb urine dipstick: NEGATIVE
Ketones, ur: NEGATIVE mg/dL
Leukocytes,Ua: NEGATIVE
Nitrite: NEGATIVE
Protein, ur: 30 mg/dL — AB
Specific Gravity, Urine: 1.021 (ref 1.005–1.030)
pH: 6 (ref 5.0–8.0)

## 2020-02-10 MED ORDER — SODIUM CHLORIDE 0.9% FLUSH: 3.0000 mL | Freq: Once | INTRAVENOUS | Status: DC

## 2020-02-10 NOTE — ED Triage Notes (Signed)
Pt reports a fall today, states he felt lightheaded prior to fall, denies LOC. Hx seizures, unable to take medications today.

## 2020-02-10 NOTE — Telephone Encounter (Signed)
Call placed to patient regarding scheduled [facility] appointment. LCSW left message requesting a return call.  

## 2020-02-11 ENCOUNTER — Emergency Department (HOSPITAL_COMMUNITY): Payer: Medicaid Other

## 2020-02-11 DIAGNOSIS — G08 Intracranial and intraspinal phlebitis and thrombophlebitis: Secondary | ICD-10-CM | POA: Diagnosis not present

## 2020-02-11 DIAGNOSIS — S060X0A Concussion without loss of consciousness, initial encounter: Secondary | ICD-10-CM | POA: Diagnosis not present

## 2020-02-11 DIAGNOSIS — R000 Tachycardia, unspecified: Secondary | ICD-10-CM | POA: Diagnosis not present

## 2020-02-11 DIAGNOSIS — J329 Chronic sinusitis, unspecified: Secondary | ICD-10-CM | POA: Diagnosis not present

## 2020-02-11 DIAGNOSIS — I8291 Chronic embolism and thrombosis of unspecified vein: Secondary | ICD-10-CM | POA: Diagnosis not present

## 2020-02-11 DIAGNOSIS — I619 Nontraumatic intracerebral hemorrhage, unspecified: Secondary | ICD-10-CM | POA: Diagnosis not present

## 2020-02-11 LAB — CBC
HCT: 46.4 % (ref 39.0–52.0)
Hemoglobin: 15.2 g/dL (ref 13.0–17.0)
MCH: 30.3 pg (ref 26.0–34.0)
MCHC: 32.8 g/dL (ref 30.0–36.0)
MCV: 92.4 fL (ref 80.0–100.0)
Platelets: 278 10*3/uL (ref 150–400)
RBC: 5.02 MIL/uL (ref 4.22–5.81)
RDW: 13.3 % (ref 11.5–15.5)
WBC: 10.2 10*3/uL (ref 4.0–10.5)
nRBC: 0 % (ref 0.0–0.2)

## 2020-02-11 LAB — BASIC METABOLIC PANEL
Anion gap: 13 (ref 5–15)
BUN: 17 mg/dL (ref 6–20)
CO2: 23 mmol/L (ref 22–32)
Calcium: 8.9 mg/dL (ref 8.9–10.3)
Chloride: 104 mmol/L (ref 98–111)
Creatinine, Ser: 0.98 mg/dL (ref 0.61–1.24)
GFR calc Af Amer: >60 mL/min (ref >60)
GFR calc non Af Amer: >60 mL/min (ref >60)
Glucose, Bld: 118 mg/dL — ABNORMAL HIGH (ref 70–99)
Potassium: 3.7 mmol/L (ref 3.5–5.1)
Sodium: 140 mmol/L (ref 135–145)

## 2020-02-11 MED ORDER — PROCHLORPERAZINE EDISYLATE 10 MG/2ML IJ SOLN
10.0000 mg | Freq: Once | INTRAMUSCULAR | Status: AC
  Administered 2020-02-11: 10 mg via INTRAVENOUS
  Filled 2020-02-11: qty 2

## 2020-02-11 MED ORDER — SODIUM CHLORIDE 0.9 % IV BOLUS
500.0000 mL | Freq: Once | INTRAVENOUS | Status: AC
  Administered 2020-02-11: 500 mL via INTRAVENOUS

## 2020-02-11 MED ORDER — GADOBUTROL 1 MMOL/ML IV SOLN
8.0000 mL | Freq: Once | INTRAVENOUS | Status: AC | PRN
  Administered 2020-02-11: 8 mL via INTRAVENOUS

## 2020-02-11 MED ORDER — DIPHENHYDRAMINE HCL 50 MG/ML IJ SOLN
25.0000 mg | Freq: Once | INTRAMUSCULAR | Status: AC
  Administered 2020-02-11: 25 mg via INTRAVENOUS
  Filled 2020-02-11: qty 1

## 2020-02-11 MED ORDER — LORAZEPAM 2 MG/ML IJ SOLN
0.5000 mg | Freq: Once | INTRAMUSCULAR | Status: AC | PRN
  Administered 2020-02-11: 0.5 mg via INTRAVENOUS
  Filled 2020-02-11: qty 1

## 2020-02-11 NOTE — ED Notes (Signed)
Pt not responding to vitals recheck  

## 2020-02-11 NOTE — ED Notes (Signed)
Patient transported to MRI 

## 2020-02-11 NOTE — ED Notes (Signed)
Help get patient undress on the monitor patient is resting with call bell in reach 

## 2020-02-11 NOTE — ED Provider Notes (Signed)
[facility] EMERGENCY DEPARTMENT Provider Note   CSN: [id number] Arrival date & time: 02/10/20  2155     History Chief Complaint  Patient presents with  . Fall    Joshua [name] is a 59 y.o. male.  The history is provided by the patient and medical records. No language interpreter was used.  Headache Pain location:  Occipital Quality:  Dull Severity currently:  9/10 Severity at highest:  9/10 Onset quality:  Sudden Duration:  1 day Timing:  Constant Progression:  Unchanged Similar to prior headaches: no   Context comment:  Trauma Relieved by:  Nothing Worsened by:  Nothing Ineffective treatments:  None tried Associated symptoms: numbness (chronic L leg), seizures (none recently) and visual change   Associated symptoms: no abdominal pain, no back pain, no congestion, no cough, no diarrhea, no dizziness, no eye pain, no facial pain, no fatigue, no fever, no focal weakness, no near-syncope, no neck pain, no neck stiffness, no photophobia, no URI, no vomiting and no weakness        Past Medical History:  Diagnosis Date  . Brain tumor ([place]) 02/20/2013   brain tumor removed in March 2014, Dr Donald Pore  . Diffuse idiopathic skeletal hyperostosis 06/05/2013  . Dizziness   . Enlarged prostate   . Headache(784.0)    scattered  . 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 Meningioma ([place])   . Meningioma, recurrent of brain ([place]) 01/31/2019  . Neuropathy 12/05/2019  . Seizures ([place])    11/27/19 last sz 1 wk ago  . Stroke [facility])     Patient Active Problem List   Diagnosis Date Noted  . Neuropathy 12/05/2019  . 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 Seizures ([place]) 01/31/2019  . Meningioma, recurrent of brain ([place]) 01/31/2019  . Meningioma ([place]) 01/03/2019  . Brain tumor ([place]) 12/27/2018  . Diffuse idiopathic skeletal hyperostosis 06/05/2013    Past Surgical History:  Procedure Laterality Date  . APPLICATION  OF CRANIAL NAVIGATION N/A 01/10/2019   Procedure: APPLICATION OF CRANIAL NAVIGATION;  Surgeon: Erline Levine, MD;  Location: [place];  Service: Neurosurgery;  Laterality: N/A;  . CATARACT EXTRACTION Right   . CRANIOTOMY Right 11/06/2012   Procedure: CRANIOTOMY TUMOR EXCISION;  Surgeon: Erline Levine, MD;  Location: [place] NEURO ORS;  Service: Neurosurgery;  Laterality: Right;  Right Parasagittal craniotomy for meningioma with Stealth  . CRANIOTOMY Right 01/10/2019   Procedure: Right Parasagittal Craniotomy for Tumor;  Surgeon: Erline Levine, MD;  Location: [place];  Service: Neurosurgery;  Laterality: Right;  Right parasagittal craniotomy for tumor  . ESOPHAGOGASTRODUODENOSCOPY    . right knee arthroscopy         Family History  Problem Relation Age of Onset  . Hypertension Mother   . Dementia Mother   . Hypertension Father   . CAD Father   . CAD Brother     Social History   Tobacco Use  . Smoking status: Former Smoker    Types: Cigarettes    Quit date: 01/29/2019    Years since quitting: 1.0  . Smokeless tobacco: Never Used  . Tobacco comment: hasn't smoked in a week  Substance Use Topics  . Alcohol use: Yes    Alcohol/week: 8.0 standard drinks    Types: 6 Cans of beer, 2 Shots of liquor per week    Comment: occasionally, 11/27/19 none  . Drug use: No    Home Medications Prior to Admission medications   Medication Sig  Start Date End Date Taking? Authorizing Provider  acetaminophen (TYLENOL) 500 MG tablet Take 1,000 mg by mouth daily as needed for mild pain.    [provider]  aspirin EC 81 MG EC tablet Take 1 tablet (81 mg total) by mouth daily. 01/14/19   Erline Levine, MD  atorvastatin (LIPITOR) 20 MG tablet Take 1 tablet (20 mg total) by mouth daily. 11/26/19   Charlott Rakes, MD  clonazePAM (KLONOPIN) 0.5 MG tablet TAKE 1 TABLET(0.5 MG) BY MOUTH TWICE DAILY 12/12/19   Vaslow, Acey Lav, MD  FIBER ADULT GUMMIES PO Take 2 tablets by mouth daily.     [provider]   gabapentin (NEURONTIN) 300 MG capsule Take 1 capsule (300 mg total) by mouth 2 (two) times daily. Patient not taking: Reported on 02/04/2020 12/03/19   [name], MD  Lacosamide 100 MG TABS Take 2 tablets (200 mg total) by mouth in the morning and at bedtime. 02/04/20   [name], MD  lamoTRIgine (LAMICTAL) 100 MG tablet Take 2 tablets (200 mg total) by mouth 2 (two) times daily. 02/04/20   Vaslow, Acey Lav, MD  LORazepam (ATIVAN) 1 MG tablet Take 1 tablet (1 mg total) by mouth every 8 (eight) hours as needed for seizure (for breakthrough seizures). Patient not taking: Reported on 02/04/2020 12/26/19   [name], MD  mirtazapine (REMERON) 15 MG tablet Take 15 mg by mouth at bedtime as needed (Sleep).  Patient not taking: Reported on 02/04/2020    [provider]  omeprazole (PRILOSEC) 20 MG capsule Take 1 capsule (20 mg total) by mouth daily. Patient not taking: Reported on 02/04/2020 12/06/19   Loura Halt A, NP  ondansetron (ZOFRAN-ODT) 4 MG disintegrating tablet Take 1 tablet (4 mg total) by mouth every 8 (eight) hours as needed for nausea or vomiting. 02/04/20   [name], MD  vitamin C (ASCORBIC ACID) 500 MG tablet Take 500 mg by mouth daily.    [provider]    Allergies    Patient has no known allergies.  Review of Systems   Review of Systems  Constitutional: Negative for chills, diaphoresis, fatigue and fever.  HENT: Negative for congestion.   Eyes: Negative for photophobia and pain.  Respiratory: Negative for cough, chest tightness, shortness of breath and wheezing.   Cardiovascular: Negative for chest pain, palpitations and near-syncope.  Gastrointestinal: Negative for abdominal pain, diarrhea and vomiting.  Genitourinary: Negative for flank pain and frequency.  Musculoskeletal: Negative for back pain, neck pain and neck stiffness.  Neurological: Positive for seizures (none recently), numbness (chronic L leg) and headaches. Negative  for dizziness, focal weakness, syncope, speech difficulty, weakness and light-headedness.  Psychiatric/Behavioral: Negative for agitation and confusion.  All other systems reviewed and are negative.   Physical Exam Updated Vital Signs BP 120/81 (BP Location: Left Arm)   Pulse 70   Temp 98.3 F (36.8 C) (Oral)   Resp 14   Ht 5\' 9"  (1.753 m)   Wt 83.9 kg   SpO2 97%   BMI 27.32 kg/m   Physical Exam Vitals and nursing note reviewed.  Constitutional:      General: He is not in acute distress.    Appearance: He is well-developed. He is not ill-appearing, toxic-appearing or diaphoretic.  HENT:     Head: Normocephalic and atraumatic.     Nose: Nose normal. No congestion or rhinorrhea.     Mouth/Throat:     Mouth: Mucous membranes are moist.  Pharynx: No oropharyngeal exudate or posterior oropharyngeal erythema.  Eyes:     Extraocular Movements: Extraocular movements intact.     Conjunctiva/sclera: Conjunctivae normal.     Pupils: Pupils are equal, round, and reactive to light.     Comments: Subjectively blurry vision bilaterally right worse than left with normal extraocular movements and visual exam otherwise.  Normal visual fields.  Cardiovascular:     Rate and Rhythm: Normal rate and regular rhythm.     Heart sounds: No murmur heard.   Pulmonary:     Effort: Pulmonary effort is normal. No respiratory distress.     Breath sounds: Normal breath sounds. No wheezing, rhonchi or rales.  Chest:     Chest wall: No tenderness.  Abdominal:     General: Abdomen is flat.     Palpations: Abdomen is soft.     Tenderness: There is no abdominal tenderness.  Musculoskeletal:        General: No tenderness.     Cervical back: Neck supple. No tenderness.  Skin:    General: Skin is warm and dry.     Capillary Refill: Capillary refill takes less than 2 seconds.     Findings: No erythema or rash.  Neurological:     Mental Status: He is alert. Mental status is at baseline.     Sensory:  No sensory deficit.     Motor: No weakness.  Psychiatric:        Mood and Affect: Mood normal.     ED Results / Procedures / Treatments   Labs (all labs ordered are listed, but only abnormal results are displayed) Labs Reviewed  BASIC METABOLIC PANEL - Abnormal; Notable for the following components:      Result Value   Glucose, Bld 118 (*)    All other components within normal limits  URINALYSIS, ROUTINE W REFLEX MICROSCOPIC - Abnormal; Notable for the following components:   Protein, ur 30 (*)    All other components within normal limits  CBC  CBG MONITORING, ED    EKG EKG Interpretation  Date/Time:  Monday February 10 2020 22:06:17 EDT Ventricular Rate:  106 PR Interval:  148 QRS Duration: 92 QT Interval:  356 QTC Calculation: 472 R Axis:   122 Text Interpretation: Sinus tachycardia Incomplete right bundle branch block Left posterior fascicular block Confirmed by Randal Buba, April ([id number]) on 02/11/2020 8:53:43 AM   Radiology MR Brain W and Wo Contrast  Result Date: 02/11/2020 CLINICAL DATA:  Vision loss. EXAM: MRI HEAD WITHOUT AND WITH CONTRAST TECHNIQUE: Multiplanar, multiecho pulse sequences of the brain and surrounding structures were obtained without and with intravenous contrast. CONTRAST:  49mL GADAVIST GADOBUTROL 1 MMOL/ML IV SOLN COMPARISON:  12/10/2019 head CT. 08/30/2019 and 11/05/2019 MRI head. FINDINGS: Brain: Sequela of vertex craniotomy for meningioma resection. Encephalomalacia, gliosis and hemosiderin deposition underlying the craniotomy site is unchanged. Dural thickening and enhancement at the vertex is more conspicuous than prior exam (11:15). There is a new T2/FLAIR hyperintense focus measuring 1.7 x 1.2 cm along the right paramidline at the vertex (3:24) which demonstrates restricted diffusion (2:47) and intrinsic T1 hyperintense signal (5:14). No masslike enhancement. Scattered and confluent T2/FLAIR hyperintense supratentorial white matter signal predominantly  involving the bilateral centrum semiovale is mildly increased since prior exam. No midline shift or ventriculomegaly. Trace extra-axial free fluid underlying the craniotomy is less conspicuous than prior exam. 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 Remaining intracranial flow voids are grossly unremarkable. Skull and upper  cervical spine: Postsurgical appearance of the vertex. Otherwise normal bone marrow signal intensity Sinuses/Orbits: Sequela of bilateral lens replacement. Clear paranasal sinuses. No mastoid effusion. Other: None. IMPRESSION: New small extra-axial hematoma underlying the right vertex, likely subacute. Increased conspicuity of dural thickening and enhancement underlying the vertex may reflect residual tumor. No masslike enhancement. Short-term interval follow-up a 4-8 weeks is recommended. 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 Interval progression of mild chronic microvascular ischemic changes predominantly involving the centrum semiovale. These results were called by telephone at the time of interpretation on 02/11/2020 at 1:08 pm to provider [facility] , who verbally acknowledged these results. Electronically Signed   By: Primitivo Gauze M.D.   On: 02/11/2020 13:17    Procedures Procedures (including critical care time)  Medications Ordered in ED Medications  sodium chloride flush (NS) 0.9 % injection 3 mL (has no administration in time range)  sodium chloride 0.9 % bolus 500 mL (0 mLs Intravenous Stopped 02/11/20 1137)  prochlorperazine (COMPAZINE) injection 10 mg (10 mg Intravenous Given 02/11/20 1047)  diphenhydrAMINE (BENADRYL) injection 25 mg (25 mg Intravenous Given 02/11/20 1046)  LORazepam (ATIVAN) injection 0.5 mg (0.5 mg Intravenous Given 02/11/20 1137)  gadobutrol (GADAVIST) 1 MMOL/ML injection 8 mL (8 mLs Intravenous Contrast Given 02/11/20 1229)    ED Course  I have reviewed the  triage vital signs and the nursing notes.  Pertinent labs & imaging results that were available during my care of the patient were reviewed by me and considered in my medical decision making (see chart for details).    MDM Rules/Calculators/A&P                          Joshua [name] is a 59 y.o. male with a past medical history significant for brain tumor status post 2 separate craniotomy and tumor resections most recently last year, seizures, and chronic left leg numbness who presents with a fall, headache, and new blurred vision. He reports that yesterday, he had a mechanical fall where he tripped going off of a curb landing on the back of his head. He did not lose consciousness but reports she has had headache and blurry vision bilaterally with right worse than left ever since. He has been in the emergency department for over 11-1/2 hours prior to my initial evaluation. He says that his headache is a 9 out of 10 in severity and is severe. He denies nausea or vomiting. He denies any recent seizures, he reports that he has seizures once every few weeks. He reports it is usually focal and primarily in his left leg with shaking. He denies any loss of bowel or bladder control. Denies any fevers, chills, chest pain, shortness of breath, urinary symptoms, or GI symptoms. He is concerned about his headache and the new blurry vision as he has not had vision changes before this fall.  On exam, patient does report blurry vision in both eyes. He reports his right eye is worse than the left. His visual fields are however intact pupils are symmetric and reactive normal extraocular movements. He has an tenderness on the back of his occiput but there is no laceration or large hematoma discovered. Neck is nontender. Back is nontender. Lungs clear and chest and abdomen are nontender. Patient has numbness in his left leg which he reports is at its baseline. No other weakness or numbness appreciated. Abdomen and chest  nontender.  Chart review was performed and  he is scheduled to have an MRI with and without contrast of his head in the next week or 2. As he is already demonstrated over 11 hours of stability despite having continued headache, instead of getting a noncontrasted CT head to look for bleeding, we will get the MRI with and without to look for not only bleeding but also see if there is any change in his tumor as he is now having the bilateral blurry vision after the fall. Anticipate touching base with neurosurgery after imaging is completed. Headache cocktail be ordered. Patient requesting requested some anxiety medicine for MRI.  Anticipate reassessment after work-up.  2:29 PM MRI returned MRI returned showing a small extra-axial hematoma underlying the right vertex that is likely subacute.  There was also some increased conspicuity of the dural thickening and enhancement which may reflect residual tumor.  No masslike enhancement.  Other unchanged appearance.  Neurosurgery was called again and they feel the patient is appropriate for discharge with close follow-up.  I went to assess the patient again and he now has significant improvement in his headache and is feeling much better.  He was able to eat and drink.  I suspect he has a concussion causing his blurry vision symptoms and headache after the fall.  Patient will follow up with neurosurgery and understand strict return precautions.  He had no other questions or concerns and was discharged in good condition with improving symptoms.   Final Clinical Impression(s) / ED Diagnoses Final diagnoses:  Fall, initial encounter  Concussion without loss of consciousness, initial encounter  Blurry vision, bilateral    Rx / DC Orders ED Discharge Orders    None     Clinical Impression: 1. Fall, initial encounter   2. Concussion without loss of consciousness, initial encounter   3. Blurry vision, bilateral     Disposition: Discharge  Condition:  Good  I have discussed the results, Dx and Tx plan with the pt(& family if present). He/she/they expressed understanding and agree(s) with the plan. Discharge instructions discussed at great length. Strict return precautions discussed and pt &/or family have verbalized understanding of the instructions. No further questions at time of discharge.    New Prescriptions   No medications on file    Follow Up: Your neurosurgeon and Oncology teams        Parley Pidcock, Gwenyth Allegra, MD 02/11/20 [phone]

## 2020-02-11 NOTE — Care Management (Addendum)
ED CM noted patient to have had 6 ED visits in the past 6 months.  Patient does follow up at the [facility]  With hx of Brain Tumor followed at [facility] last visit 7/20. Patient presented today with c/o fall.

## 2020-02-11 NOTE — Discharge Instructions (Signed)
The neurosurgery team felt your imaging today did not show evidence of acute bleeding or any concerning findings that would require admission.  Please follow-up with your neurosurgery and oncology team for further management.  Please rest and stay hydrated.  I suspect you have a concussion.  As your symptoms are improving, we feel you are safe for discharge home.  If any symptoms change or worsen, please return to the nearest emergency department immediately.

## 2020-02-11 NOTE — ED Notes (Addendum)
Hx brain tumor x 2 removed in June- benign- but has been falling x 2 recently- c/o blurry vision also-  Out of chlorazepam for seizures-  [address] on concrete yesterday

## 2020-02-12 ENCOUNTER — Telehealth: Payer: Self-pay | Admitting: Family Medicine

## 2020-02-12 ENCOUNTER — Telehealth: Payer: Self-pay

## 2020-02-12 DIAGNOSIS — M62838 Other muscle spasm: Secondary | ICD-10-CM

## 2020-02-12 NOTE — Telephone Encounter (Signed)
Referral has been placed. 

## 2020-02-12 NOTE — Telephone Encounter (Signed)
Please advise.  Copied from [place] [id number]. Topic: General - Other >> Feb 12, 2020  9:32 AM Leward Quan A wrote: Reason for CRM: Patient called to inquire of Dr Margarita Rana if she will be the one to order him additional Physical Therapy. He is asking for a call back at Ph# [phone]

## 2020-02-12 NOTE — Telephone Encounter (Signed)
Received TC from patient stating that he would like to be referred back to PT to start water exercises again. Dr Mickeal Skinner not here. Sent staff message to him and his nurse Julianne Rice, RN about pt request so that they can follow up tomorrow.

## 2020-02-12 NOTE — Telephone Encounter (Signed)
Patient was called and he states that he was informed that he has 10 more session with Pt and states that they informed him that PCP would have to authorize.  So I guess a new referral will need to be placed.

## 2020-02-12 NOTE — Telephone Encounter (Signed)
Patient was called and informed of referral being placed. 

## 2020-02-12 NOTE — Telephone Encounter (Signed)
Please advise.  Copied from [place] [id number]. Topic: Referral - Request for Referral >> Feb 12, 2020  4:44 PM Erick Blinks wrote: Needs PT referral rerouted to correct location  "[facility]rd [facility] therapy"

## 2020-02-13 ENCOUNTER — Emergency Department
Admission: EM | Admit: 2020-02-13 | Discharge: 2020-02-13 | Disposition: A | Discharge status: Home / Self Care | Payer: Medicaid Other | Attending: Emergency Medicine | Admitting: Emergency Medicine

## 2020-02-13 ENCOUNTER — Other Ambulatory Visit: Payer: Self-pay

## 2020-02-13 DIAGNOSIS — Z87891 Personal history of nicotine dependence: Secondary | ICD-10-CM | POA: Insufficient documentation

## 2020-02-13 DIAGNOSIS — M7981 Nontraumatic hematoma of soft tissue: Secondary | ICD-10-CM | POA: Diagnosis not present

## 2020-02-13 DIAGNOSIS — Z79899 Other long term (current) drug therapy: Secondary | ICD-10-CM | POA: Diagnosis not present

## 2020-02-13 DIAGNOSIS — Z7982 Long term (current) use of aspirin: Secondary | ICD-10-CM | POA: Insufficient documentation

## 2020-02-13 DIAGNOSIS — S5012XA Contusion of left forearm, initial encounter: Secondary | ICD-10-CM | POA: Diagnosis not present

## 2020-02-13 DIAGNOSIS — T148XXA Other injury of unspecified body region, initial encounter: Secondary | ICD-10-CM

## 2020-02-13 DIAGNOSIS — R21 Rash and other nonspecific skin eruption: Secondary | ICD-10-CM | POA: Diagnosis present

## 2020-02-13 NOTE — ED Provider Notes (Signed)
Emergency Department Provider Note  ____________________________________________  Time seen: Approximately 7:55 PM  I have reviewed the triage vital signs and the nursing notes.   HISTORY  Chief Complaint Rash   Historian Patient     HPI Joshua [name] is a 59 y.o. male presents to the emergency department with a bruise along the volar aspect of the left forearm.  Patient has a small puncture wound visualized along center of bruise.   Patient was seen and evaluated on 02/10/2020 and had labs.  Patient is unsure where he had labs drawn up.  He denies fever chills.  No erythema of the skin.  Patient states that he came into the emergency department because he was concerned he might have been stung by an insect.   Past Medical History:  Diagnosis Date  . Brain tumor ([place]) 02/20/2013   brain tumor removed in March 2014, Dr Donald Pore  . Diffuse idiopathic skeletal hyperostosis 06/05/2013  . Dizziness   . Enlarged prostate   . Headache(784.0)    scattered  . 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 Meningioma ([place])   . Meningioma, recurrent of brain ([place]) 01/31/2019  . Neuropathy 12/05/2019  . Seizures ([place])    11/27/19 last sz 1 wk ago  . Stroke [facility])      Immunizations up to date:  Yes.     Past Medical History:  Diagnosis Date  . Brain tumor ([place]) 02/20/2013   brain tumor removed in March 2014, Dr Donald Pore  . Diffuse idiopathic skeletal hyperostosis 06/05/2013  . Dizziness   . Enlarged prostate   . Headache(784.0)    scattered  . 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 Meningioma ([place])   . Meningioma, recurrent of brain ([place]) 01/31/2019  . Neuropathy 12/05/2019  . Seizures ([place])    11/27/19 last sz 1 wk ago  . Stroke [facility])     Patient Active Problem List   Diagnosis Date Noted  . Neuropathy 12/05/2019  . 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 Seizures ([place]) 01/31/2019  . Meningioma, recurrent of  brain ([place]) 01/31/2019  . Meningioma ([place]) 01/03/2019  . Brain tumor ([place]) 12/27/2018  . Diffuse idiopathic skeletal hyperostosis 06/05/2013    Past Surgical History:  Procedure Laterality Date  . APPLICATION OF CRANIAL NAVIGATION N/A 01/10/2019   Procedure: APPLICATION OF CRANIAL NAVIGATION;  Surgeon: Erline Levine, MD;  Location: [place];  Service: Neurosurgery;  Laterality: N/A;  . CATARACT EXTRACTION Right   . CRANIOTOMY Right 11/06/2012   Procedure: CRANIOTOMY TUMOR EXCISION;  Surgeon: Erline Levine, MD;  Location: [place] NEURO ORS;  Service: Neurosurgery;  Laterality: Right;  Right Parasagittal craniotomy for meningioma with Stealth  . CRANIOTOMY Right 01/10/2019   Procedure: Right Parasagittal Craniotomy for Tumor;  Surgeon: Erline Levine, MD;  Location: [place];  Service: Neurosurgery;  Laterality: Right;  Right parasagittal craniotomy for tumor  . ESOPHAGOGASTRODUODENOSCOPY    . right knee arthroscopy      Prior to Admission medications   Medication Sig Start Date End Date Taking? Authorizing Provider  acetaminophen (TYLENOL) 500 MG tablet Take 1,000 mg by mouth daily as needed for mild pain.    [provider]  aspirin EC 81 MG EC tablet Take 1 tablet (81 mg total) by mouth daily. 01/14/19   Erline Levine, MD  atorvastatin (LIPITOR) 20 MG tablet Take 1 tablet (20 mg total) by mouth daily. 11/26/19   Charlott Rakes, MD  clonazePAM (KLONOPIN) 0.5  MG tablet TAKE 1 TABLET(0.5 MG) BY MOUTH TWICE DAILY Patient taking differently: Take 0.5 mg by mouth 2 (two) times daily.  12/12/19   [name], MD  FIBER ADULT GUMMIES PO Take 2 tablets by mouth daily.     [provider]  gabapentin (NEURONTIN) 300 MG capsule Take 1 capsule (300 mg total) by mouth 2 (two) times daily. Patient not taking: Reported on 02/04/2020 12/03/19   [name], MD  Lacosamide 100 MG TABS Take 2 tablets (200 mg total) by mouth in the morning and at bedtime. 02/04/20   [name], MD   lamoTRIgine (LAMICTAL) 100 MG tablet Take 2 tablets (200 mg total) by mouth 2 (two) times daily. 02/04/20   Vaslow, Acey Lav, MD  LORazepam (ATIVAN) 1 MG tablet Take 1 tablet (1 mg total) by mouth every 8 (eight) hours as needed for seizure (for breakthrough seizures). Patient not taking: Reported on 02/04/2020 12/26/19   [name], MD  mirtazapine (REMERON) 15 MG tablet Take 15 mg by mouth at bedtime as needed (Sleep).  Patient not taking: Reported on 02/04/2020    [provider]  omeprazole (PRILOSEC) 20 MG capsule Take 1 capsule (20 mg total) by mouth daily. Patient not taking: Reported on 02/04/2020 12/06/19   Loura Halt A, NP  ondansetron (ZOFRAN-ODT) 4 MG disintegrating tablet Take 1 tablet (4 mg total) by mouth every 8 (eight) hours as needed for nausea or vomiting. 02/04/20   [name], MD  vitamin C (ASCORBIC ACID) 500 MG tablet Take 500 mg by mouth daily.    [provider]    Allergies Patient has no known allergies.  Family History  Problem Relation Age of Onset  . Hypertension Mother   . Dementia Mother   . Hypertension Father   . CAD Father   . CAD Brother     Social History Social History   Tobacco Use  . Smoking status: Former Smoker    Types: Cigarettes    Quit date: 01/29/2019    Years since quitting: 1.0  . Smokeless tobacco: Never Used  . Tobacco comment: hasn't smoked in a week  Substance Use Topics  . Alcohol use: Yes    Alcohol/week: 8.0 standard drinks    Types: 6 Cans of beer, 2 Shots of liquor per week    Comment: occasionally, 11/27/19 none  . Drug use: No     Review of Systems  Constitutional: No fever/chills Eyes:  No discharge ENT: No upper respiratory complaints. Respiratory: no cough. No SOB/ use of accessory muscles to breath Gastrointestinal:   No nausea, no vomiting.  No diarrhea.  No constipation. Musculoskeletal: Negative for musculoskeletal pain. Skin: Patient has left AC bruise.      ____________________________________________   PHYSICAL EXAM:  VITAL SIGNS: ED Triage Vitals  Enc Vitals Group     BP 02/13/20 1628 (!) 148/86     Pulse Rate 02/13/20 1628 90     Resp 02/13/20 1628 16     Temp 02/13/20 1628 98.3 F (36.8 C)     Temp Source 02/13/20 1628 Oral     SpO2 02/13/20 1628 97 %     Weight 02/13/20 1629 185 lb (83.9 kg)     Height 02/13/20 1629 5\' 9"  (1.753 m)     Head Circumference --      [place] Flow --      Pain Score 02/13/20 1635 0     Pain Loc --  Pain Edu? --      Excl. in [place]? --      Constitutional: Alert and oriented. Well appearing and in no acute distress. Eyes: Conjunctivae are normal. PERRL. EOMI. Head: Atraumatic. Cardiovascular: Normal rate, regular rhythm. Normal S1 and S2.  Good peripheral circulation. Respiratory: Normal respiratory effort without tachypnea or retractions. Lungs CTAB. Good air entry to the bases with no decreased or absent breath sounds Gastrointestinal: Bowel sounds x 4 quadrants. Soft and nontender to palpation. No guarding or rigidity. No distention. Musculoskeletal: Full range of motion to all extremities. No obvious deformities noted Neurologic:  Normal for age. No gross focal neurologic deficits are appreciated.  Skin: Patient has small bruise at left [facility]. Psychiatric: Mood and affect are normal for age. Speech and behavior are normal.   ____________________________________________   LABS (all labs ordered are listed, but only abnormal results are displayed)  Labs Reviewed - No data to display ____________________________________________  EKG   ____________________________________________  RADIOLOGY   No results found.  ____________________________________________    PROCEDURES  Procedure(s) performed:     Procedures     Medications - No data to display   ____________________________________________   INITIAL IMPRESSION / ASSESSMENT AND PLAN / ED COURSE  Pertinent  labs & imaging results that were available during my care of the patient were reviewed by me and considered in my medical decision making (see chart for details).      Assessment and plan Bruise 59 year old male presents to the emergency department with a small bruise at left [facility].  History and physical exam findings suggest localized hematoma from venipuncture.  Recommended warm compress these and Tylenol for discomfort.  All patient questions were answered.    ____________________________________________  FINAL CLINICAL IMPRESSION(S) / ED DIAGNOSES  Final diagnoses:  Bruise      NEW MEDICATIONS STARTED DURING THIS VISIT:  ED Discharge Orders    None          This chart was dictated using voice recognition software/Dragon. Despite best efforts to proofread, errors can occur which can change the meaning. Any change was purely unintentional.     Karren Cobble 02/13/20 2009    Blake Divine, MD 02/13/20 2152

## 2020-02-13 NOTE — ED Triage Notes (Signed)
Pt arrives via POV for bruise to left forearm that he noticed this morning. Pt reports no injury to the injury but reports he had an MRI on Monday but does not think he had an IV or blood drawn from this area. Pt concerned because the bruise has a red dot in the center of it. Pt in NAD, skin warm and dry. Small bruise noted to left forearm, no bleeding observed.

## 2020-02-13 NOTE — Telephone Encounter (Signed)
Noted  Referral sent to [facility]

## 2020-02-18 ENCOUNTER — Ambulatory Visit: Payer: Medicaid Other | Admitting: Internal Medicine

## 2020-02-19 ENCOUNTER — Emergency Department: Payer: Medicaid Other

## 2020-02-19 ENCOUNTER — Emergency Department
Admission: EM | Admit: 2020-02-19 | Discharge: 2020-02-19 | Disposition: A | Discharge status: Home / Self Care | Payer: Medicaid Other | Attending: Emergency Medicine | Admitting: Emergency Medicine

## 2020-02-19 ENCOUNTER — Other Ambulatory Visit: Payer: Self-pay

## 2020-02-19 DIAGNOSIS — Z79899 Other long term (current) drug therapy: Secondary | ICD-10-CM | POA: Diagnosis not present

## 2020-02-19 DIAGNOSIS — I10 Essential (primary) hypertension: Secondary | ICD-10-CM | POA: Diagnosis not present

## 2020-02-19 DIAGNOSIS — R079 Chest pain, unspecified: Secondary | ICD-10-CM | POA: Insufficient documentation

## 2020-02-19 DIAGNOSIS — Z5321 Procedure and treatment not carried out due to patient leaving prior to being seen by health care provider: Secondary | ICD-10-CM | POA: Insufficient documentation

## 2020-02-19 DIAGNOSIS — R0789 Other chest pain: Secondary | ICD-10-CM | POA: Diagnosis not present

## 2020-02-19 DIAGNOSIS — M25512 Pain in left shoulder: Secondary | ICD-10-CM | POA: Diagnosis not present

## 2020-02-19 LAB — BASIC METABOLIC PANEL
Anion gap: 8 (ref 5–15)
BUN: 13 mg/dL (ref 6–20)
CO2: 25 mmol/L (ref 22–32)
Calcium: 9.1 mg/dL (ref 8.9–10.3)
Chloride: 107 mmol/L (ref 98–111)
Creatinine, Ser: 0.88 mg/dL (ref 0.61–1.24)
GFR calc Af Amer: >60 mL/min (ref >60)
GFR calc non Af Amer: >60 mL/min (ref >60)
Glucose, Bld: 126 mg/dL — ABNORMAL HIGH (ref 70–99)
Potassium: 4 mmol/L (ref 3.5–5.1)
Sodium: 140 mmol/L (ref 135–145)

## 2020-02-19 LAB — CBC
HCT: 42.7 % (ref 39.0–52.0)
Hemoglobin: 14.6 g/dL (ref 13.0–17.0)
MCH: 31.5 pg (ref 26.0–34.0)
MCHC: 34.2 g/dL (ref 30.0–36.0)
MCV: 92 fL (ref 80.0–100.0)
Platelets: 263 10*3/uL (ref 150–400)
RBC: 4.64 MIL/uL (ref 4.22–5.81)
RDW: 13.7 % (ref 11.5–15.5)
WBC: 8.4 10*3/uL (ref 4.0–10.5)
nRBC: 0 % (ref 0.0–0.2)

## 2020-02-19 LAB — TROPONIN I (HIGH SENSITIVITY): Troponin I (High Sensitivity): 6 ng/L (ref <18)

## 2020-02-19 NOTE — ED Triage Notes (Signed)
First Nurse Note:  Arrives [facility] c/o chest pain x 45 min.  Per report EKG NSR.  ASA 324 mg and 18g LAC.  VS wnl.  CBG:  127.

## 2020-02-19 NOTE — ED Triage Notes (Signed)
Pt arrives via ems with c/o chest pain starting around 2hrs ago. States in center of chest and radiating to back. Pt denies any visual changes, n/v/d, or dizziness. Pt a&o x 4, NAD noted at this time.

## 2020-02-21 ENCOUNTER — Telehealth: Payer: Self-pay | Admitting: *Deleted

## 2020-02-21 ENCOUNTER — Other Ambulatory Visit: Payer: Self-pay | Admitting: *Deleted

## 2020-02-21 ENCOUNTER — Encounter: Payer: Self-pay | Admitting: General Practice

## 2020-02-21 DIAGNOSIS — D320 Benign neoplasm of cerebral meninges: Secondary | ICD-10-CM

## 2020-02-21 NOTE — Telephone Encounter (Signed)
Received call from patient. He states he is now homeless. States the place where his wife stays is only supposed to be for 1 person and he had to leave. He has nor place to stay. 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  He has minimal food. He does say he has enough medications at this time. He is  requesting assistance with his living and food situation. Urgent SW referral sent. Dr. Mickeal Skinner made aware.

## 2020-02-21 NOTE — Progress Notes (Addendum)
[place] CSW Progress Notes  Request from E Tracey RN to contact patient who states he is homeless, malnourished.  Call to patient - he is currently homeless.  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  Denies intent to hurt himself, but "I get in that slump and I know I am not right in my head."  Currently is at "one of my friends house, he has kids, he told me I was welcome to take a shower here but then I have to leave."  Is currently physically at [address] [place]; however is [place] resident and bounces between [place] and [place].  Gets a disability check, "every time I get my disability check there are other things coming out of it, so I am coming up short w the deposit."  Is frustrated that he has not been able to achieve stable housing because he cannot afford deposit and first months rent.  No family/friends able/willing to help per patient.  He also states that he cannot get enough food to keep himself nourished while "I take all these medications."  Over income for Food Stamps.  He does have Medicaid.    Discussed options w patient.  Referred to [place] at [employer] [facility] per [facility] protocol.  Also left her VM and informed Johny Blamer, [place] coordinator.  Advised patient that agencies may be closed and asked if he could make a plan for stability over the weekend - he states he may be able to do that.  Discussed the option of helping him find  assisted living/family care home as he says his lack of nutrition has made him weak and prone to falls.  "I dont want to go that far, I dont want to be in a place like that unless I have to."  Wants help finding independent living situation.  Appears that patient was referrred to [employer] and was given [employer] of income based housing options.  Per patient, he has not been able to set aside sufficient funds to pay deposits so has been unable to secure housing.  CSW will advise CSW Wallis Bamberg who covers Dr Reche Dixon patients of situation and ask her to follow up w patient on her return.  Per patient, he is not in imminent danger or suicidal.  He is frustrated and discouraged over his inability to get stable housing and is frustrated w not receiving the help he was expecting in doing so.  CSW will update team.  Edwyna Shell, LCSW Clinical Social Worker Phone:  [phone] Cell:  [phone]

## 2020-02-23 ENCOUNTER — Encounter (HOSPITAL_COMMUNITY): Payer: Self-pay | Admitting: Emergency Medicine

## 2020-02-23 ENCOUNTER — Emergency Department (HOSPITAL_COMMUNITY)
Admission: EM | Admit: 2020-02-23 | Discharge: 2020-02-23 | Disposition: A | Discharge status: Home / Self Care | Payer: Medicaid Other | Attending: Emergency Medicine | Admitting: Emergency Medicine

## 2020-02-23 ENCOUNTER — Emergency Department (HOSPITAL_COMMUNITY): Payer: Medicaid Other

## 2020-02-23 ENCOUNTER — Other Ambulatory Visit: Payer: Self-pay

## 2020-02-23 DIAGNOSIS — R05 Cough: Secondary | ICD-10-CM | POA: Insufficient documentation

## 2020-02-23 DIAGNOSIS — R1013 Epigastric pain: Secondary | ICD-10-CM | POA: Insufficient documentation

## 2020-02-23 DIAGNOSIS — Z79899 Other long term (current) drug therapy: Secondary | ICD-10-CM | POA: Diagnosis not present

## 2020-02-23 DIAGNOSIS — Z7982 Long term (current) use of aspirin: Secondary | ICD-10-CM | POA: Diagnosis not present

## 2020-02-23 DIAGNOSIS — R0789 Other chest pain: Secondary | ICD-10-CM | POA: Diagnosis not present

## 2020-02-23 DIAGNOSIS — F17200 Nicotine dependence, unspecified, uncomplicated: Secondary | ICD-10-CM | POA: Diagnosis not present

## 2020-02-23 DIAGNOSIS — R52 Pain, unspecified: Secondary | ICD-10-CM | POA: Diagnosis not present

## 2020-02-23 DIAGNOSIS — I10 Essential (primary) hypertension: Secondary | ICD-10-CM | POA: Diagnosis not present

## 2020-02-23 DIAGNOSIS — R079 Chest pain, unspecified: Secondary | ICD-10-CM | POA: Diagnosis not present

## 2020-02-23 LAB — CBC
HCT: 43.2 % (ref 39.0–52.0)
Hemoglobin: 14.4 g/dL (ref 13.0–17.0)
MCH: 31.2 pg (ref 26.0–34.0)
MCHC: 33.3 g/dL (ref 30.0–36.0)
MCV: 93.5 fL (ref 80.0–100.0)
Platelets: 265 10*3/uL (ref 150–400)
RBC: 4.62 MIL/uL (ref 4.22–5.81)
RDW: 13.6 % (ref 11.5–15.5)
WBC: 5.4 10*3/uL (ref 4.0–10.5)
nRBC: 0 % (ref 0.0–0.2)

## 2020-02-23 LAB — BASIC METABOLIC PANEL
Anion gap: 10 (ref 5–15)
BUN: 11 mg/dL (ref 6–20)
CO2: 26 mmol/L (ref 22–32)
Calcium: 9.1 mg/dL (ref 8.9–10.3)
Chloride: 105 mmol/L (ref 98–111)
Creatinine, Ser: 0.86 mg/dL (ref 0.61–1.24)
GFR calc Af Amer: >60 mL/min (ref >60)
GFR calc non Af Amer: >60 mL/min (ref >60)
Glucose, Bld: 110 mg/dL — ABNORMAL HIGH (ref 70–99)
Potassium: 3.8 mmol/L (ref 3.5–5.1)
Sodium: 141 mmol/L (ref 135–145)

## 2020-02-23 LAB — TROPONIN I (HIGH SENSITIVITY)
Troponin I (High Sensitivity): 5 ng/L (ref <18)
Troponin I (High Sensitivity): 6 ng/L (ref <18)

## 2020-02-23 MED ORDER — CYCLOBENZAPRINE HCL 10 MG PO TABS
5.0000 mg | ORAL_TABLET | Freq: Once | ORAL | Status: AC
  Administered 2020-02-23: 5 mg via ORAL
  Filled 2020-02-23: qty 1

## 2020-02-23 MED ORDER — LIDOCAINE 5 % EX PTCH
1.0000 | MEDICATED_PATCH | CUTANEOUS | Status: DC
  Administered 2020-02-23: 1 via TRANSDERMAL
  Filled 2020-02-23: qty 1

## 2020-02-23 MED ORDER — LIDOCAINE 5 % EX PTCH: 1.0000 | MEDICATED_PATCH | CUTANEOUS | 0 refills | Status: DC

## 2020-02-23 MED ORDER — ACETAMINOPHEN 325 MG PO TABS
650.0000 mg | ORAL_TABLET | Freq: Once | ORAL | Status: AC
  Administered 2020-02-23: 650 mg via ORAL
  Filled 2020-02-23: qty 2

## 2020-02-23 MED ORDER — SODIUM CHLORIDE 0.9% FLUSH: 3.0000 mL | Freq: Once | INTRAVENOUS | Status: DC

## 2020-02-23 MED ORDER — CYCLOBENZAPRINE HCL 5 MG PO TABS
5.0000 mg | ORAL_TABLET | Freq: Two times a day (BID) | ORAL | 0 refills | Status: DC | PRN
Start: 2020-02-23 — End: 2020-04-22

## 2020-02-23 NOTE — ED Triage Notes (Signed)
Pt to triage via [facility].  Pt reports L sided chest pain since this morning that is worse with deep inspiration.  Cough with green phlegm yesterday.  Also reports SOB.  Denies vomiting.

## 2020-02-23 NOTE — Discharge Instructions (Signed)
You can take Tylenol as needed for pain along with Flexeril which is a muscle relaxer Apply the lidocaine patch on the chest as needed for pain Please return if you are worsening

## 2020-02-23 NOTE — ED Provider Notes (Signed)
[place] EMERGENCY DEPARTMENT Provider Note   CSN: [id number] Arrival date & time: 02/23/20  1256     History Chief Complaint  Patient presents with  . Chest Pain    Joshua [name] is a 59 y.o. male with history of meningioma s/p resection, seizures who presents with chest pain. He states that he noticed some L sided chest pain last night but it wasn't that bad. This morning he was leaving [employer] and started to have a little more chest pain. It's worse with breathing in. He also has a cough productive of green phlegm at times but states he will sometimes have this with acid reflux. This afternoon he sneezed really hard which caused sharp, severe chest pain over the L side of his chest. Now it hurts to touch and hurts when he moves his L arm. He has not tried anything for pain. He denies fever, chills, SOB, abdominal pain, N/V, diaphoresis. No recent surgery/travel/immobilization, hx of cancer, leg swelling or calf pain, hemoptysis, prior DVT/PE, or hormone use. He is a "weekend smoker". He has been doing some heavy lifting because he is moving and is unsure if that's related.   HPI     Past Medical History:  Diagnosis Date  . Brain tumor ([place]) 02/20/2013   brain tumor removed in March 2014, Dr Donald Pore  . Diffuse idiopathic skeletal hyperostosis 06/05/2013  . Dizziness   . Enlarged prostate   . Headache(784.0)    scattered  . 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 Meningioma ([place])   . Meningioma, recurrent of brain ([place]) 01/31/2019  . Neuropathy 12/05/2019  . Seizures ([place])    11/27/19 last sz 1 wk ago  . Stroke [facility])     Patient Active Problem List   Diagnosis Date Noted  . Neuropathy 12/05/2019  . 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 Seizures ([place]) 01/31/2019  . Meningioma, recurrent of brain ([place]) 01/31/2019  . Meningioma ([place]) 01/03/2019  . Brain tumor ([place]) 12/27/2018  . Diffuse idiopathic  skeletal hyperostosis 06/05/2013    Past Surgical History:  Procedure Laterality Date  . APPLICATION OF CRANIAL NAVIGATION N/A 01/10/2019   Procedure: APPLICATION OF CRANIAL NAVIGATION;  Surgeon: Erline Levine, MD;  Location: [place];  Service: Neurosurgery;  Laterality: N/A;  . CATARACT EXTRACTION Right   . CRANIOTOMY Right 11/06/2012   Procedure: CRANIOTOMY TUMOR EXCISION;  Surgeon: Erline Levine, MD;  Location: [place] NEUR[facility] ORS;  Service: Neurosurgery;  Laterality: Right;  Right Parasagittal craniotomy for meningioma with Stealth  . CRANIOTOMY Right 01/10/2019   Procedure: Right Parasagittal Craniotomy for Tumor;  Surgeon: Erline Levine, MD;  Location: [place];  Service: Neurosurgery;  Laterality: Right;  Right parasagittal craniotomy for tumor  . ESOPHAGOGASTRODUODENOSCOPY    . right knee arthroscopy         Family History  Problem Relation Age of Onset  . Hypertension Mother   . Dementia Mother   . Hypertension Father   . CAD Father   . CAD Brother     Social History   Tobacco Use  . Smoking status: Current Some Day Smoker    Types: Cigarettes    Last attempt to quit: 01/29/2019    Years since quitting: 1.0  . Smokeless tobacco: Never Used  . Tobacco comment: hasn't smoked in a week  Vaping Use  . Vaping Use: Never used  Substance Use Topics  . Alcohol use: Not Currently    Alcohol/week: 8.0  standard drinks    Types: 6 Cans of beer, 2 Shots of liquor per week    Comment: occasionally, 11/27/19 none  . Drug use: No    Home Medications Prior to Admission medications   Medication Sig Start Date End Date Taking? Authorizing Provider  acetaminophen (TYLENOL) 500 MG tablet Take 1,000 mg by mouth daily as needed for mild pain.    [provider]  aspirin EC 81 MG EC tablet Take 1 tablet (81 mg total) by mouth daily. 01/14/19   Erline Levine, MD  atorvastatin (LIPITOR) 20 MG tablet Take 1 tablet (20 mg total) by mouth daily. 11/26/19   Charlott Rakes, MD  clonazePAM  (KLONOPIN) 0.5 MG tablet TAKE 1 TABLET(0.5 MG) BY MOUTH TWICE DAILY Patient taking differently: Take 0.5 mg by mouth 2 (two) times daily.  12/12/19   [name], MD  FIBER ADULT GUMMIES PO Take 2 tablets by mouth daily.     [provider]  gabapentin (NEURONTIN) 300 MG capsule Take 1 capsule (300 mg total) by mouth 2 (two) times daily. Patient not taking: Reported on 02/04/2020 12/03/19   [name], MD  Lacosamide 100 MG TABS Take 2 tablets (200 mg total) by mouth in the morning and at bedtime. 02/04/20   [name], MD  lamoTRIgine (LAMICTAL) 100 MG tablet Take 2 tablets (200 mg total) by mouth 2 (two) times daily. 02/04/20   Vaslow, Acey Lav, MD  LORazepam (ATIVAN) 1 MG tablet Take 1 tablet (1 mg total) by mouth every 8 (eight) hours as needed for seizure (for breakthrough seizures). Patient not taking: Reported on 02/04/2020 12/26/19   [name], MD  mirtazapine (REMERON) 15 MG tablet Take 15 mg by mouth at bedtime as needed (Sleep).  Patient not taking: Reported on 02/04/2020    [provider]  omeprazole (PRILOSEC) 20 MG capsule Take 1 capsule (20 mg total) by mouth daily. Patient not taking: Reported on 02/04/2020 12/06/19   Loura Halt A, NP  ondansetron (ZOFRAN-ODT) 4 MG disintegrating tablet Take 1 tablet (4 mg total) by mouth every 8 (eight) hours as needed for nausea or vomiting. 02/04/20   [name], MD  vitamin C (ASCORBIC ACID) 500 MG tablet Take 500 mg by mouth daily.    [provider]    Allergies    Patient has no known allergies.  Review of Systems   Review of Systems  Constitutional: Negative for chills and fever.  Respiratory: Positive for cough. Negative for shortness of breath.   Cardiovascular: Positive for chest pain. Negative for leg swelling.  Gastrointestinal: Negative for abdominal pain, nausea and vomiting.  Neurological: Negative for syncope and light-headedness.  All other systems reviewed and are  negative.   Physical Exam Updated Vital Signs BP 134/75 (BP Location: Right Arm)   Pulse 66   Temp 98.3 F (36.8 C) (Oral)   Resp 16   Ht 5\' 9"  (1.753 m)   Wt 83.9 kg   SpO2 97%   BMI 27.32 kg/m   Physical Exam Vitals and nursing note reviewed.  Constitutional:      General: He is not in acute distress.    Appearance: Normal appearance. He is well-developed. He is not ill-appearing.  HENT:     Head: Normocephalic and atraumatic.  Eyes:     General: No scleral icterus.       Right eye: No discharge.        Left eye: No discharge.  Conjunctiva/sclera: Conjunctivae normal.     Pupils: Pupils are equal, round, and reactive to light.  Cardiovascular:     Rate and Rhythm: Normal rate and regular rhythm.  Pulmonary:     Effort: Pulmonary effort is normal. No respiratory distress.     Breath sounds: Normal breath sounds.  Chest:     Chest wall: Tenderness (reproducible tenderness over left chest wall) present.  Abdominal:     General: There is no distension.     Palpations: Abdomen is soft.     Tenderness: There is abdominal tenderness (mild epigastric tenderness).  Musculoskeletal:     Cervical back: Normal range of motion.     Right lower leg: No edema.     Left lower leg: No edema.  Skin:    General: Skin is warm and dry.  Neurological:     Mental Status: He is alert and oriented to person, place, and time.  Psychiatric:        Behavior: Behavior normal.     ED Results / Procedures / Treatments   Labs (all labs ordered are listed, but only abnormal results are displayed) Labs Reviewed  BASIC METABOLIC PANEL - Abnormal; Notable for the following components:      Result Value   Glucose, Bld 110 (*)    All other components within normal limits  CBC  TROPONIN I (HIGH SENSITIVITY)  TROPONIN I (HIGH SENSITIVITY)    EKG EKG Interpretation  Date/Time:  Sunday February 23 2020 13:35:00 EDT Ventricular Rate:  55 PR Interval:  144 QRS Duration: 98 QT  Interval:  422 QTC Calculation: 403 R Axis:   105 Text Interpretation: Sinus bradycardia Rightward axis Nonspecific T wave abnormality Abnormal ECG No significant change was found Confirmed by Gerlene Fee [id number]) on 02/23/2020 6:08:40 PM   Radiology DG Chest 2 View  Result Date: 02/23/2020 CLINICAL DATA:  Left-sided chest pain EXAM: CHEST - 2 VIEW COMPARISON:  02/19/2020 FINDINGS: The heart size and mediastinal contours are within normal limits. Both lungs are clear. Disc degenerative disease of the thoracic spine. IMPRESSION: No acute abnormality of the lungs. Electronically Signed   By: Eddie Candle M.D.   On: 02/23/2020 14:13    Procedures Procedures (including critical care time)  Medications Ordered in ED Medications  sodium chloride flush (NS) 0.9 % injection 3 mL (has no administration in time range)  lidocaine (LIDODERM) 5 % 1 patch (1 patch Transdermal Patch Applied 02/23/20 1746)  acetaminophen (TYLENOL) tablet 650 mg (650 mg Oral Given 02/23/20 1743)  cyclobenzaprine (FLEXERIL) tablet 5 mg (5 mg Oral Given 02/23/20 1744)   ED Course  I have reviewed the triage vital signs and the nursing notes.  Pertinent labs & imaging results that were available during my care of the patient were reviewed by me and considered in my medical decision making (see chart for details).  59 year old male presents with left sided chest pain since last night which is worse after he sneezed today.  He states the pain is worse with breathing but also worse with palpation and movement of the left arm.  It is reproducible on exam.  His EKG is sinus bradycardia.  Chest x-ray is negative.  His labs and initial troponin are normal.  Although he is not technically PERC negative due to his age he has no significant risk factors for PE and he is not tachycardic or hypoxic.  His heart score is 2. High suspicion for MSK etiology. He was given Lidocaine patch, flexeril, Tylenol. 2nd  trop is normal. On recheck he feels  better. He was given a prescription for a muscle relaxer and lidocaine patches and advised to return if worsening  MDM Rules/Calculators/A&P                          Final Clinical Impression(s) / ED Diagnoses Final diagnoses:  Chest wall pain    Rx / DC Orders ED Discharge Orders    None       Recardo Evangelist, PA-C 02/23/20 1937    Maudie Flakes, MD 02/23/20 2048

## 2020-02-24 ENCOUNTER — Telehealth: Payer: Self-pay | Admitting: *Deleted

## 2020-02-24 ENCOUNTER — Telehealth: Payer: Self-pay

## 2020-02-24 NOTE — Telephone Encounter (Signed)
Joshua [name]  Clinical Social [name] attempted to contact patient by phone. Left voicemail to return call.  Gwinda Maine, LCSW  Clinical Social Worker [facility]

## 2020-02-24 NOTE — Telephone Encounter (Signed)
Patient called stating that he was told by someone that a social worker would be getting in contact with him. However he never received a phone call. Sent a message to [employer] and [employer] the social workers so that some could follow up with him.

## 2020-02-25 ENCOUNTER — Encounter: Payer: Self-pay | Admitting: *Deleted

## 2020-02-25 NOTE — Progress Notes (Signed)
[place] Work  Clinical Social Work spoke with patient by phone and met with patient in [employer].  Joshua [name] is currently homeless- goes to a friend's home to shower but cannot stay the night.  CSW made referral to [facility] for emergency shelter and long-term housing support. Bennita Curtain [facility]) communicated low lack of resources available but plans to follow up with patient this week.  CSW instructed patient to await call from Ms. Curtain.  CSW will follow up with patient.  Gwinda Maine, LCSW  Clinical Social Worker [facility]

## 2020-02-26 ENCOUNTER — Encounter: Payer: Self-pay | Admitting: *Deleted

## 2020-02-26 NOTE — Progress Notes (Signed)
Patient ID: Joshua [name], male   DOB: 12-Nov-1960, 59 y.o.   MRN: [id number] Patient delivered [employer] cell phone (without case) and cell phone charger to [facility].  Patient also delivered Nathalie delivered to him with a second cell phone/ case.  Both items given to [employer] representative, Evelena Asa, 02/26/2020.

## 2020-02-27 ENCOUNTER — Other Ambulatory Visit: Payer: Self-pay

## 2020-02-27 ENCOUNTER — Emergency Department
Admission: EM | Admit: 2020-02-27 | Discharge: 2020-02-27 | Disposition: A | Discharge status: Home / Self Care | Payer: Medicaid Other | Attending: Emergency Medicine | Admitting: Emergency Medicine

## 2020-02-27 DIAGNOSIS — Z85841 Personal history of malignant neoplasm of brain: Secondary | ICD-10-CM | POA: Diagnosis not present

## 2020-02-27 DIAGNOSIS — R197 Diarrhea, unspecified: Secondary | ICD-10-CM | POA: Diagnosis not present

## 2020-02-27 DIAGNOSIS — Z7982 Long term (current) use of aspirin: Secondary | ICD-10-CM | POA: Insufficient documentation

## 2020-02-27 DIAGNOSIS — M545 Low back pain, unspecified: Secondary | ICD-10-CM

## 2020-02-27 DIAGNOSIS — M549 Dorsalgia, unspecified: Secondary | ICD-10-CM | POA: Diagnosis present

## 2020-02-27 DIAGNOSIS — F17210 Nicotine dependence, cigarettes, uncomplicated: Secondary | ICD-10-CM | POA: Diagnosis not present

## 2020-02-27 LAB — COMPREHENSIVE METABOLIC PANEL
ALT: 34 U/L (ref 0–44)
AST: 25 U/L (ref 15–41)
Albumin: 4.7 g/dL (ref 3.5–5.0)
Alkaline Phosphatase: 78 U/L (ref 38–126)
Anion gap: 10 (ref 5–15)
BUN: 18 mg/dL (ref 6–20)
CO2: 25 mmol/L (ref 22–32)
Calcium: 9.3 mg/dL (ref 8.9–10.3)
Chloride: 106 mmol/L (ref 98–111)
Creatinine, Ser: 1.07 mg/dL (ref 0.61–1.24)
GFR calc Af Amer: >60 mL/min (ref >60)
GFR calc non Af Amer: >60 mL/min (ref >60)
Glucose, Bld: 114 mg/dL — ABNORMAL HIGH (ref 70–99)
Potassium: 4.2 mmol/L (ref 3.5–5.1)
Sodium: 141 mmol/L (ref 135–145)
Total Bilirubin: 0.9 mg/dL (ref 0.3–1.2)
Total Protein: 7.7 g/dL (ref 6.5–8.1)

## 2020-02-27 LAB — URINALYSIS, COMPLETE (UACMP) WITH MICROSCOPIC
Bacteria, UA: NONE SEEN
Bilirubin Urine: NEGATIVE
Glucose, UA: NEGATIVE mg/dL
Hgb urine dipstick: NEGATIVE
Ketones, ur: NEGATIVE mg/dL
Leukocytes,Ua: NEGATIVE
Nitrite: NEGATIVE
Protein, ur: 100 mg/dL — AB
Specific Gravity, Urine: 1.03 (ref 1.005–1.030)
Squamous Epithelial / HPF: NONE SEEN (ref 0–5)
pH: 5 (ref 5.0–8.0)

## 2020-02-27 LAB — CBC
HCT: 45.3 % (ref 39.0–52.0)
Hemoglobin: 15.7 g/dL (ref 13.0–17.0)
MCH: 31.7 pg (ref 26.0–34.0)
MCHC: 34.7 g/dL (ref 30.0–36.0)
MCV: 91.3 fL (ref 80.0–100.0)
Platelets: 271 10*3/uL (ref 150–400)
RBC: 4.96 MIL/uL (ref 4.22–5.81)
RDW: 13.8 % (ref 11.5–15.5)
WBC: 8 10*3/uL (ref 4.0–10.5)
nRBC: 0 % (ref 0.0–0.2)

## 2020-02-27 NOTE — ED Triage Notes (Signed)
Pt states diarrhea and bilateral lower back paint that began an hour ago. Pt denies vomiting, known fever.

## 2020-02-27 NOTE — ED Provider Notes (Signed)
[facility] Emergency Department Provider Note   ____________________________________________   I have reviewed the triage vital signs and the nursing notes.   HISTORY  Chief Complaint Diarrhea and Back Pain   History limited by: Not Limited   HPI Joshua [name] is a 59 y.o. male who presents to the emergency department today because of concerns for both possible food poisoning as well as urine issues.  The patient states for the past month he has noticed whenever he drinks anything he immediately has to pee.  He states this happens with beer and water.  Starting the last night however he developed diarrhea.  He states this occurred after he went to [employer].  His wife also had issues with diarrhea.  This is been accompanied by some mid back pain.  He states he has not told anybody about his urinary symptoms because he thought they were going to go away although he has been to the emergency department 5 times in the past month.   Records reviewed. Per medical record review patient has a history of gastroenteritis.  Past Medical History:  Diagnosis Date  . Brain tumor ([place]) 02/20/2013   brain tumor removed in March 2014, Dr Donald Pore  . Diffuse idiopathic skeletal hyperostosis 06/05/2013  . Dizziness   . Enlarged prostate   . Headache(784.0)    scattered  . 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 Meningioma ([place])   . Meningioma, recurrent of brain ([place]) 01/31/2019  . Neuropathy 12/05/2019  . Seizures ([place])    11/27/19 last sz 1 wk ago  . Stroke [facility])     Patient Active Problem List   Diagnosis Date Noted  . Neuropathy 12/05/2019  . 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 Seizures ([place]) 01/31/2019  . Meningioma, recurrent of brain ([place]) 01/31/2019  . Meningioma ([place]) 01/03/2019  . Brain tumor ([place]) 12/27/2018  . Diffuse idiopathic skeletal hyperostosis 06/05/2013    Past Surgical History:  Procedure  Laterality Date  . APPLICATION OF CRANIAL NAVIGATION N/A 01/10/2019   Procedure: APPLICATION OF CRANIAL NAVIGATION;  Surgeon: Erline Levine, MD;  Location: [place];  Service: Neurosurgery;  Laterality: N/A;  . CATARACT EXTRACTION Right   . CRANIOTOMY Right 11/06/2012   Procedure: CRANIOTOMY TUMOR EXCISION;  Surgeon: Erline Levine, MD;  Location: [place] NEURO ORS;  Service: Neurosurgery;  Laterality: Right;  Right Parasagittal craniotomy for meningioma with Stealth  . CRANIOTOMY Right 01/10/2019   Procedure: Right Parasagittal Craniotomy for Tumor;  Surgeon: Erline Levine, MD;  Location: [place];  Service: Neurosurgery;  Laterality: Right;  Right parasagittal craniotomy for tumor  . ESOPHAGOGASTRODUODENOSCOPY    . right knee arthroscopy      Prior to Admission medications   Medication Sig Start Date End Date Taking? Authorizing Provider  acetaminophen (TYLENOL) 500 MG tablet Take 1,000 mg by mouth daily as needed for mild pain.    [provider]  aspirin EC 81 MG EC tablet Take 1 tablet (81 mg total) by mouth daily. 01/14/19   Erline Levine, MD  atorvastatin (LIPITOR) 20 MG tablet Take 1 tablet (20 mg total) by mouth daily. 11/26/19   Charlott Rakes, MD  clonazePAM (KLONOPIN) 0.5 MG tablet TAKE 1 TABLET(0.5 MG) BY MOUTH TWICE DAILY Patient taking differently: Take 0.5 mg by mouth 2 (two) times daily.  12/12/19   Vaslow, Acey Lav, MD  cyclobenzaprine (FLEXERIL) 5 MG tablet Take 1 tablet (5 mg total) by mouth 2 (two) times daily  as needed for muscle spasms. 02/23/20   Recardo Evangelist, PA-C  FIBER ADULT GUMMIES PO Take 2 tablets by mouth daily.     [provider]  gabapentin (NEURONTIN) 300 MG capsule Take 1 capsule (300 mg total) by mouth 2 (two) times daily. Patient not taking: Reported on 02/04/2020 12/03/19   [name], MD  Lacosamide 100 MG TABS Take 2 tablets (200 mg total) by mouth in the morning and at bedtime. 02/04/20   [name], MD  lamoTRIgine (LAMICTAL) 100 MG  tablet Take 2 tablets (200 mg total) by mouth 2 (two) times daily. 02/04/20   Vaslow, Acey Lav, MD  lidocaine (LIDODERM) 5 % Place 1 patch onto the skin daily. Remove & Discard patch within 12 hours or as directed by MD 02/23/20   Recardo Evangelist, PA-C  LORazepam (ATIVAN) 1 MG tablet Take 1 tablet (1 mg total) by mouth every 8 (eight) hours as needed for seizure (for breakthrough seizures). Patient not taking: Reported on 02/04/2020 12/26/19   [name], MD  mirtazapine (REMERON) 15 MG tablet Take 15 mg by mouth at bedtime as needed (Sleep).  Patient not taking: Reported on 02/04/2020    [provider]  omeprazole (PRILOSEC) 20 MG capsule Take 1 capsule (20 mg total) by mouth daily. Patient not taking: Reported on 02/04/2020 12/06/19   Loura Halt A, NP  ondansetron (ZOFRAN-ODT) 4 MG disintegrating tablet Take 1 tablet (4 mg total) by mouth every 8 (eight) hours as needed for nausea or vomiting. 02/04/20   [name], MD  vitamin C (ASCORBIC ACID) 500 MG tablet Take 500 mg by mouth daily.    [provider]    Allergies Patient has no known allergies.  Family History  Problem Relation Age of Onset  . Hypertension Mother   . Dementia Mother   . Hypertension Father   . CAD Father   . CAD Brother     Social History Social History   Tobacco Use  . Smoking status: Current Some Day Smoker    Types: Cigarettes    Last attempt to quit: 01/29/2019    Years since quitting: 1.0  . Smokeless tobacco: Never Used  . Tobacco comment: hasn't smoked in a week  Vaping Use  . Vaping Use: Never used  Substance Use Topics  . Alcohol use: Not Currently    Alcohol/week: 8.0 standard drinks    Types: 6 Cans of beer, 2 Shots of liquor per week    Comment: occasionally, 11/27/19 none  . Drug use: No    Review of Systems Constitutional: No fever/chills Eyes: No visual changes. ENT: No sore throat. Cardiovascular: Denies chest pain. Respiratory: Denies shortness of  breath. Gastrointestinal: No abdominal pain.  Positive for diarrhea. Genitourinary: Positive for increased urination. Musculoskeletal: Positive for back pain. Skin: Negative for rash. Neurological: Negative for headaches, focal weakness or numbness.  ____________________________________________   PHYSICAL EXAM:  VITAL SIGNS: ED Triage Vitals [02/27/20 1619]  Enc Vitals Group     BP (!) 143/87     Pulse Rate 79     Resp 16     Temp 98.5 F (36.9 C)     Temp Source Oral     SpO2 99 %     Weight      Height      Head Circumference      Peak Flow      Pain Score 8   Constitutional: Alert and oriented.  Eyes: Conjunctivae  are normal.  ENT      Head: Normocephalic and atraumatic.      Nose: No congestion/rhinnorhea.      Mouth/Throat: Mucous membranes are moist.      Neck: No stridor. Hematological/Lymphatic/Immunilogical: No cervical lymphadenopathy. Cardiovascular: Normal rate, regular rhythm.  No murmurs, rubs, or gallops.  Respiratory: Normal respiratory effort without tachypnea nor retractions. Breath sounds are clear and equal bilaterally. No wheezes/rales/rhonchi. Gastrointestinal: Soft and non tender. No rebound. No guarding.  Genitourinary: Deferred Musculoskeletal: Normal range of motion in all extremities. No lower extremity edema. Neurologic:  Normal speech and language. No gross focal neurologic deficits are appreciated.  Skin:  Skin is warm, dry and intact. No rash noted. Psychiatric: Mood and affect are normal. Speech and behavior are normal. Patient exhibits appropriate insight and judgment.  ____________________________________________    LABS (pertinent positives/negatives)  CBC wbc 8.0, hgb 15.7, plt 271 CMP wnl except glu 114 UA hazy, protein 100, rbc and wbc 0-5 ____________________________________________   EKG  None  ____________________________________________     RADIOLOGY  None  ____________________________________________   PROCEDURES  Procedures  ____________________________________________   INITIAL IMPRESSION / ASSESSMENT AND PLAN / ED COURSE  Pertinent labs & imaging results that were available during my care of the patient were reviewed by me and considered in my medical decision making (see chart for details).   Patient presented to the emergency department today because of concerns for both diarrhea and urinary symptoms.  In terms of his diarrhea I do think food poisoning is like.  No leukocytosis or fever here to suggest significant infection and abdomen is benign.  Urine here also without any obvious signs of infection.  Will send for culture just to make sure.  Discussed findings with patient.  Will discharge. ____________________________________________   FINAL CLINICAL IMPRESSION(S) / ED DIAGNOSES  Final diagnoses:  Low back pain, unspecified back pain laterality, unspecified chronicity, unspecified whether sciatica present  Diarrhea, unspecified type     Note: This dictation was prepared with Dragon dictation. Any transcriptional errors that result from this process are unintentional     Nance Pear, MD 02/27/20 [phone]

## 2020-02-27 NOTE — Discharge Instructions (Addendum)
Please seek medical attention for any high fevers, chest pain, shortness of breath, change in behavior, persistent vomiting, bloody stool or any other new or concerning symptoms.  

## 2020-02-28 LAB — URINE CULTURE

## 2020-02-29 ENCOUNTER — Other Ambulatory Visit: Payer: Self-pay

## 2020-02-29 ENCOUNTER — Emergency Department (HOSPITAL_COMMUNITY): Payer: Medicaid Other

## 2020-02-29 ENCOUNTER — Emergency Department (HOSPITAL_COMMUNITY)
Admission: EM | Admit: 2020-02-29 | Discharge: 2020-03-01 | Disposition: A | Discharge status: Home / Self Care | Payer: Medicaid Other | Attending: Emergency Medicine | Admitting: Emergency Medicine

## 2020-02-29 ENCOUNTER — Encounter (HOSPITAL_COMMUNITY): Payer: Self-pay | Admitting: Emergency Medicine

## 2020-02-29 DIAGNOSIS — I10 Essential (primary) hypertension: Secondary | ICD-10-CM | POA: Diagnosis not present

## 2020-02-29 DIAGNOSIS — G9389 Other specified disorders of brain: Secondary | ICD-10-CM | POA: Diagnosis not present

## 2020-02-29 DIAGNOSIS — Z7982 Long term (current) use of aspirin: Secondary | ICD-10-CM | POA: Diagnosis not present

## 2020-02-29 DIAGNOSIS — C709 Malignant neoplasm of meninges, unspecified: Secondary | ICD-10-CM | POA: Diagnosis not present

## 2020-02-29 DIAGNOSIS — F17210 Nicotine dependence, cigarettes, uncomplicated: Secondary | ICD-10-CM | POA: Diagnosis not present

## 2020-02-29 DIAGNOSIS — R55 Syncope and collapse: Secondary | ICD-10-CM | POA: Insufficient documentation

## 2020-02-29 DIAGNOSIS — R519 Headache, unspecified: Secondary | ICD-10-CM | POA: Diagnosis not present

## 2020-02-29 DIAGNOSIS — R220 Localized swelling, mass and lump, head: Secondary | ICD-10-CM | POA: Diagnosis not present

## 2020-02-29 DIAGNOSIS — R52 Pain, unspecified: Secondary | ICD-10-CM | POA: Diagnosis not present

## 2020-02-29 LAB — BASIC METABOLIC PANEL
Anion gap: 10 (ref 5–15)
BUN: 17 mg/dL (ref 6–20)
CO2: 25 mmol/L (ref 22–32)
Calcium: 9.5 mg/dL (ref 8.9–10.3)
Chloride: 106 mmol/L (ref 98–111)
Creatinine, Ser: 0.99 mg/dL (ref 0.61–1.24)
GFR calc Af Amer: >60 mL/min (ref >60)
GFR calc non Af Amer: >60 mL/min (ref >60)
Glucose, Bld: 117 mg/dL — ABNORMAL HIGH (ref 70–99)
Potassium: 4.1 mmol/L (ref 3.5–5.1)
Sodium: 141 mmol/L (ref 135–145)

## 2020-02-29 LAB — CBC
HCT: 47.6 % (ref 39.0–52.0)
Hemoglobin: 15.5 g/dL (ref 13.0–17.0)
MCH: 31 pg (ref 26.0–34.0)
MCHC: 32.6 g/dL (ref 30.0–36.0)
MCV: 95.2 fL (ref 80.0–100.0)
Platelets: 287 10*3/uL (ref 150–400)
RBC: 5 MIL/uL (ref 4.22–5.81)
RDW: 13.6 % (ref 11.5–15.5)
WBC: 8 10*3/uL (ref 4.0–10.5)
nRBC: 0 % (ref 0.0–0.2)

## 2020-02-29 LAB — URINALYSIS, ROUTINE W REFLEX MICROSCOPIC
Bacteria, UA: NONE SEEN
Bilirubin Urine: NEGATIVE
Glucose, UA: NEGATIVE mg/dL
Hgb urine dipstick: NEGATIVE
Ketones, ur: NEGATIVE mg/dL
Leukocytes,Ua: NEGATIVE
Nitrite: NEGATIVE
Protein, ur: 100 mg/dL — AB
Specific Gravity, Urine: 1.026 (ref 1.005–1.030)
pH: 5 (ref 5.0–8.0)

## 2020-02-29 LAB — CBG MONITORING, ED: Glucose-Capillary: 117 mg/dL — ABNORMAL HIGH (ref 70–99)

## 2020-02-29 NOTE — ED Triage Notes (Signed)
BIB EMS from home. Patient reports he had an unwitnessed syncopal episode earlier today. Patient states he has history of meningioma and is concerned for reoccurrence of tumor.

## 2020-03-01 MED ORDER — ACETAMINOPHEN 325 MG PO TABS
650.0000 mg | ORAL_TABLET | Freq: Once | ORAL | Status: AC
  Administered 2020-03-01: 650 mg via ORAL
  Filled 2020-03-01: qty 2

## 2020-03-01 NOTE — ED Provider Notes (Signed)
[facility] EMERGENCY DEPARTMENT Provider Note   CSN: [id number] Arrival date & time: 02/29/20  2007     History Chief Complaint  Patient presents with  . Near Syncope    Joshua [name] is a 59 y.o. male with past medical history significant for meningioma s/p resection in 2014 and 2020, seizures, neuropathy.  Patient received Covid vaccinations.  HPI Patient presents to emergency department today via EMS with chief complaint of near syncope pain just prior to arrival.  Patient states he was walking outside when he had a sudden onset of sharp pain in the back of his head.  He denies having history of similar pain before.  He was able to walk to his car and took [place].  He felt like a seizure might be coming on.  He felt like he was going to pass out however never did actually lose consciousness.  He denies falling or hitting his head.  Patient states that during the day he had been frequently going in and out in the heat.  He was feeling very hot and possibly dehydrated as he had not drank much water during the day.  He is also under an increased amount of stress recently.  Not take any medications for symptoms prior to arrival.  Reports compliance with his seizure medications.  Denies any recent illness or antibiotic use.   Patient states he currently has a headache.  Headache is located behind his eyes.  It feels like his usual headaches.  It has progressively worsened since onset.  It is not the worst headache of his life. He denies any fever, chills, visual changes, neck pain or stiffness, cough, hemoptysis, shortness of breath, chest pain, abdominal pain, nausea, emesis, loss of bowel or bladder, back pain, numbness, weakness.      Chart review shows patient had office visit with oncologist Dr. Mickeal Skinner on 02/04/2020.  It was recommended that he have a follow-up MR brain in x 2 months.  It looks like it has been scheduled for 04/03/2020.  Past Medical History:    Diagnosis Date  . Brain tumor ([place]) 02/20/2013   brain tumor removed in March 2014, Dr Donald Pore  . Diffuse idiopathic skeletal hyperostosis 06/05/2013  . Dizziness   . Enlarged prostate   . Headache(784.0)    scattered  . 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[place]) 03/07/2019  . Meningioma ([place])   . Meningioma, recurrent of brain ([place]) 01/31/2019  . Neuropathy 12/05/2019  . Seizures ([place])    11/27/19 last sz 1 wk ago  . Stroke [facility])     Patient Active Problem List   Diagnosis Date Noted  . Neuropathy 12/05/2019  . 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[place]) 03/07/2019  . Seizures ([place]) 01/31/2019  . Meningioma, recurrent of brain ([place]) 01/31/2019  . Meningioma ([place]) 01/03/2019  . Brain tumor ([place]) 12/27/2018  . Diffuse idiopathic skeletal hyperostosis 06/05/2013    Past Surgical History:  Procedure Laterality Date  . APPLICATION OF CRANIAL NAVIGATION N/A 01/10/2019   Procedure: APPLICATION OF CRANIAL NAVIGATION;  Surgeon: Erline Levine, MD;  Location: [place];  Service: Neurosurgery;  Laterality: N/A;  . CATARACT EXTRACTION Right   . CRANIOTOMY Right 11/06/2012   Procedure: CRANIOTOMY TUMOR EXCISION;  Surgeon: Erline Levine, MD;  Location: [place] [facility];  Service: Neurosurgery;  Laterality: Right;  Right Parasagittal craniotomy for meningioma with Stealth  . CRANIOTOMY Right 01/10/2019   Procedure: Right Parasagittal Craniotomy for Tumor;  Surgeon: Erline Levine, MD;  Location: [facility];  Service: Neurosurgery;  Laterality: Right;  Right parasagittal craniotomy for tumor  . ESOPHAGOGASTRODUODENOSCOPY    . right knee arthroscopy         Family History  Problem Relation Age of Onset  . Hypertension Mother   . Dementia Mother   . Hypertension Father   . CAD Father   . CAD Brother     Social History   Tobacco Use  . Smoking status: Current Some Day Smoker    Types: Cigarettes    Last attempt to quit: 01/29/2019    Years since quitting: 1.0  . Smokeless tobacco: Never  Used  . Tobacco comment: hasn't smoked in a week  Vaping Use  . Vaping Use: Never used  Substance Use Topics  . Alcohol use: Not Currently    Alcohol/week: 8.0 standard drinks    Types: 6 Cans of beer, 2 Shots of liquor per week    Comment: occasionally, 11/27/19 none  . Drug use: No    Home Medications Prior to Admission medications   Medication Sig Start Date End Date Taking? Authorizing Provider  acetaminophen (TYLENOL) 500 MG tablet Take 1,000 mg by mouth 2 (two) times daily as needed for headache (pain).    Yes [provider]  aspirin EC 81 MG EC tablet Take 1 tablet (81 mg total) by mouth daily. 01/14/19  Yes Erline Levine, MD  atorvastatin (LIPITOR) 20 MG tablet Take 1 tablet (20 mg total) by mouth daily. 11/26/19  Yes Newlin, Enobong, MD  clonazePAM (KLONOPIN) 0.5 MG tablet TAKE 1 TABLET(0.5 MG) BY MOUTH TWICE DAILY Patient taking differently: Take 0.5 mg by mouth 2 (two) times daily.  12/12/19  Yes Vaslow, Acey Lav, MD  cyclobenzaprine (FLEXERIL) 5 MG tablet Take 1 tablet (5 mg total) by mouth 2 (two) times daily as needed for muscle spasms. 02/23/20  Yes Recardo Evangelist, PA-C  FIBER ADULT GUMMIES PO Take 2 tablets by mouth daily.    Yes [provider]  Lacosamide 100 MG TABS Take 2 tablets (200 mg total) by mouth in the morning and at bedtime. 02/04/20  Yes Vaslow, Acey Lav, MD  lamoTRIgine (LAMICTAL) 100 MG tablet Take 2 tablets (200 mg total) by mouth 2 (two) times daily. 02/04/20  Yes Vaslow, Acey Lav, MD  lidocaine (LIDODERM) 5 % Place 1 patch onto the skin daily. Remove & Discard patch within 12 hours or as directed by MD Patient taking differently: Place 1 patch onto the skin daily as needed (pain). Remove & Discard patch within 12 hours or as directed by MD 02/23/20  Yes Recardo Evangelist, PA-C  ondansetron (ZOFRAN-ODT) 4 MG disintegrating tablet Take 1 tablet (4 mg total) by mouth every 8 (eight) hours as needed for nausea or vomiting. 02/04/20  Yes Vaslow,  Acey Lav, MD  vitamin C (ASCORBIC ACID) 500 MG tablet Take 500 mg by mouth daily.   Yes [provider]  gabapentin (NEURONTIN) 300 MG capsule Take 1 capsule (300 mg total) by mouth 2 (two) times daily. Patient not taking: Reported on 02/04/2020 12/03/19   [name], MD  LORazepam (ATIVAN) 1 MG tablet Take 1 tablet (1 mg total) by mouth every 8 (eight) hours as needed for seizure (for breakthrough seizures). Patient not taking: Reported on 02/04/2020 12/26/19   [name], MD  mirtazapine (REMERON) 15 MG tablet Take 15 mg by mouth at bedtime as needed (Sleep).  Patient not taking: Reported on 02/04/2020    [provider]  omeprazole (PRILOSEC) 20 MG capsule Take 1 capsule (20 mg total) by mouth daily. Patient not taking: Reported on 02/04/2020 12/06/19   Orvan July, NP    Allergies    Patient has no known allergies.  Review of Systems   Review of Systems All other systems are reviewed and are negative for acute change except as noted in the HPI.  Physical Exam Updated Vital Signs BP 132/77 (BP Location: Right Arm)   Pulse (!) 54   Temp 98.2 F (36.8 C) (Oral)   Resp 16   Ht 5\' 9"  (1.753 m)   Wt 83.9 kg   SpO2 96%   BMI 27.31 kg/m   Physical Exam Vitals and nursing note reviewed.  Constitutional:      General: He is not in acute distress.    Appearance: He is not ill-appearing.  HENT:     Head: Normocephalic.     Comments: No sinus or temporal tenderness.  Healed craniotomy scar, no signs of infection    Right Ear: Tympanic membrane and external ear normal.     Left Ear: Tympanic membrane and external ear normal.     Nose: Nose normal.     Mouth/Throat:     Mouth: Mucous membranes are moist.     Pharynx: Oropharynx is clear.     Comments: No evidence of tongue biting or oral injury Eyes:     General: No scleral icterus.       Right eye: No discharge.        Left eye: No discharge.     Extraocular Movements: Extraocular movements  intact.     Conjunctiva/sclera: Conjunctivae normal.     Pupils: Pupils are equal, round, and reactive to light.  Neck:     Vascular: No JVD.     Comments: No meningeal signs Cardiovascular:     Rate and Rhythm: Normal rate and regular rhythm.     Pulses: Normal pulses.          Radial pulses are 2+ on the right side and 2+ on the left side.     Heart sounds: Normal heart sounds.  Pulmonary:     Comments: Lungs clear to auscultation in all fields. Symmetric chest rise. No wheezing, rales, or rhonchi. Abdominal:     Comments: Abdomen is soft, non-distended, and non-tender in all quadrants. No rigidity, no guarding. No peritoneal signs.  Musculoskeletal:        General: Normal range of motion.     Cervical back: Normal range of motion.  Skin:    General: Skin is warm and dry.     Capillary Refill: Capillary refill takes less than 2 seconds.  Neurological:     Mental Status: He is oriented to person, place, and time.     GCS: GCS eye subscore is 4. GCS verbal subscore is 5. GCS motor subscore is 6.     Comments: Speech is clear and goal oriented, follows commands CN III-XII intact, no facial droop Normal strength in upper bilateral upper extremities, strong equal grip strength.  4/5 strength in left lower extremity compared to right lower extremity 5/5, baseline per patient. Reflexes symmetric. Sensation normal to light and sharp touch Moves extremities without ataxia, coordination intact Normal finger to nose and rapid alternating movements Normal gait and balance  Psychiatric:        Behavior: Behavior normal.     ED Results / Procedures / Treatments   Labs (all labs ordered are listed, but  only abnormal results are displayed) Labs Reviewed  BASIC METABOLIC PANEL - Abnormal; Notable for the following components:      Result Value   Glucose, Bld 117 (*)    All other components within normal limits  URINALYSIS, ROUTINE W REFLEX MICROSCOPIC - Abnormal; Notable for the  following components:   Protein, ur 100 (*)    All other components within normal limits  CBG MONITORING, ED - Abnormal; Notable for the following components:   Glucose-Capillary 117 (*)    All other components within normal limits  CBC    EKG EKG Interpretation  Date/Time:  Saturday February 29 2020 20:49:59 EDT Ventricular Rate:  75 PR Interval:  160 QRS Duration: 90 QT Interval:  374 QTC Calculation: 417 R Axis:   119 Text Interpretation: Normal sinus rhythm Left posterior fascicular block Abnormal ECG No significant change since last tracing Confirmed by Dorie Rank [id number]) on 03/01/2020 9:44:51 AM   Radiology CT HEAD WO CONTRAST  Result Date: 02/29/2020 CLINICAL DATA:  Syncope, history of meningioma resection. EXAM: CT HEAD WITHOUT CONTRAST TECHNIQUE: Contiguous axial images were obtained from the base of the skull through the vertex without intravenous contrast. COMPARISON:  02/11/2020 MRI head and prior. 12/10/2019 head CT and prior. FINDINGS: Brain: Vertex encephalomalacia, unchanged. No acute infarct or intracranial hemorrhage. No mass lesion. No midline shift, ventriculomegaly or extra-axial fluid collection. Vascular: No hyperdense vessel or unexpected calcification. Skull: No acute osseous abnormality.  Sequela of vertex craniotomy. Sinuses/Orbits: Normal orbits. Clear paranasal sinuses. No mastoid effusion. Other: None. IMPRESSION: No acute intracranial process. Sequela of vertex craniotomy for mass resection. Electronically Signed   By: Primitivo Gauze M.D.   On: 02/29/2020 22:52    Procedures Procedures (including critical care time)  Medications Ordered in ED Medications  acetaminophen (TYLENOL) tablet 650 mg (has no administration in time range)    ED Course  I have reviewed the triage vital signs and the nursing notes.  Pertinent labs & imaging results that were available during my care of the patient were reviewed by me and considered in my medical decision  making (see chart for details).    MDM Rules/Calculators/A&P                          History provided by patient with additional history obtained from chart review.    59 yo male presenting for near syncope.  He is afebrile, normotensive, no tachycardia or hypoxia. CBG on arrival was 117.  Labs head CT ordered in triage.  I viewed lab results for CBC and BMP, there are no acute findings.  UA is negative for infection.  Head CT negative for any acute intracranial processes.  EKG without ischemic changes. Patient had prolonged wait time in the lobby of approximately 13 hours secondary to high volume.  During length of stay he has not been observed to have any seizure-like activity. On my exam he is well-appearing, no acute distress.  Neuro exam shows he has weakness in left lower extremity 4/5 strength compared to the right which is 5/5 strength.  This is unchanged per patient and based on chart review has been documented as such in the past.  No exam findings to suggest seizure activity prior to arrival as well.  No evidence of tongue biting, no bowel or bladder incontinence.  Patient tells me he feels back to baseline.  He has a mild headache that feels like his typical.  Patient given Tylenol and  something to eat. Case discussed with ED attending Dr. Tomi Bamberger who agrees that no further imaging is needed at time given negative head CT and reassuring exam. Near syncope likely related to increased stress and being out in the severe heat without adequate fluid intake.  The patient appears reasonably screened and/or stabilized for discharge and I doubt any other medical condition or other [facility] requiring further screening, evaluation, or treatment in the ED at this time prior to discharge. The patient is safe for discharge with strict return precautions discussed. Recommend pcp follow up for symptom recheck in 2-5 days.    Portions of this note were generated with [employer]. Dictation errors  may occur despite best attempts at proofreading.  Final Clinical Impression(s) / ED Diagnoses Final diagnoses:  None    Rx / DC Orders ED Discharge Orders    None       Flint Melter 03/01/20 1015    Dorie Rank, MD 03/02/20 [phone]

## 2020-03-01 NOTE — Discharge Instructions (Signed)
Keep your appointment for MRI scheduled in September.  Do your best to stay well-hydrated especially when being outdoors.  Increase your fluid intake.  Your labs today were normal.  Head CT did not show any abnormalities

## 2020-03-01 NOTE — ED Notes (Signed)
Patient verbalizes understanding of discharge instructions . Opportunity for questions and answers were provided . Armband removed by staff ,Pt discharged from ED. W/C  offered at D/C  and Declined W/C at D/C and was escorted to lobby by RN.  

## 2020-03-02 ENCOUNTER — Other Ambulatory Visit: Payer: Self-pay

## 2020-03-02 ENCOUNTER — Encounter: Payer: Self-pay | Admitting: Physical Therapy

## 2020-03-02 ENCOUNTER — Ambulatory Visit: Payer: Medicaid Other | Admitting: Physical Therapy

## 2020-03-02 DIAGNOSIS — R2689 Other abnormalities of gait and mobility: Secondary | ICD-10-CM | POA: Insufficient documentation

## 2020-03-02 NOTE — Therapy (Signed)
[facility] [address] [place], [place], [address] Phone: [phone]   Fax:  [phone]  Patient Details  Name: Joshua [name] MRN: [id number] Date of Birth: 11/22/1960 Referring Provider:  Charlott Rakes, MD  Encounter Date: 03/02/2020   Pt arrived for PT evaluation but realized he had lost his phone after he entered clinic lobby.  Pt stated he was upset about losing phone and wanted to reschedule appt, stating, "i'm not going to be into this right now".  Pt declined PT eval due to having lost his cell phone and wanted to go try to find it.  Pt rescheduled PT eval for Tuesday, Aug. 17, 2021.  Alda Lea, PT 03/02/2020, 10:59 AM  [facility] [address] [place], [place], [address] Phone: [phone]   Fax:  [phone]

## 2020-03-03 ENCOUNTER — Ambulatory Visit: Payer: Medicaid Other | Attending: Internal Medicine | Admitting: Physical Therapy

## 2020-03-09 ENCOUNTER — Encounter: Payer: Self-pay | Admitting: Internal Medicine

## 2020-03-09 NOTE — Progress Notes (Signed)
Met with patient whom brought proof of income for [employer] as discussed on phone this morning.  Patient approved for one-time $1000 [employer]. He received a copy of the approval letter as well as the expense sheet.  Also went to obtain his gas cards from [employer], museum for him due to mobility.  Gave him gas cards.  He has my card for any additional financial questions or concerns as well as information given per his request on what is needed to submit rent payments for him.

## 2020-03-10 ENCOUNTER — Other Ambulatory Visit: Payer: Self-pay | Admitting: Radiation Therapy

## 2020-03-10 ENCOUNTER — Other Ambulatory Visit: Payer: Self-pay

## 2020-03-10 ENCOUNTER — Emergency Department
Admission: EM | Admit: 2020-03-10 | Discharge: 2020-03-10 | Disposition: A | Discharge status: Home / Self Care | Payer: Medicaid Other | Attending: Emergency Medicine | Admitting: Emergency Medicine

## 2020-03-10 ENCOUNTER — Emergency Department: Payer: Medicaid Other

## 2020-03-10 ENCOUNTER — Encounter: Payer: Self-pay | Admitting: Emergency Medicine

## 2020-03-10 DIAGNOSIS — Z85841 Personal history of malignant neoplasm of brain: Secondary | ICD-10-CM | POA: Diagnosis not present

## 2020-03-10 DIAGNOSIS — Z7982 Long term (current) use of aspirin: Secondary | ICD-10-CM | POA: Diagnosis not present

## 2020-03-10 DIAGNOSIS — I700 Atherosclerosis of aorta: Secondary | ICD-10-CM | POA: Diagnosis not present

## 2020-03-10 DIAGNOSIS — Z79899 Other long term (current) drug therapy: Secondary | ICD-10-CM | POA: Insufficient documentation

## 2020-03-10 DIAGNOSIS — K429 Umbilical hernia without obstruction or gangrene: Secondary | ICD-10-CM | POA: Diagnosis not present

## 2020-03-10 DIAGNOSIS — R110 Nausea: Secondary | ICD-10-CM | POA: Insufficient documentation

## 2020-03-10 DIAGNOSIS — N400 Enlarged prostate without lower urinary tract symptoms: Secondary | ICD-10-CM | POA: Diagnosis not present

## 2020-03-10 DIAGNOSIS — R109 Unspecified abdominal pain: Secondary | ICD-10-CM | POA: Diagnosis not present

## 2020-03-10 DIAGNOSIS — Z87891 Personal history of nicotine dependence: Secondary | ICD-10-CM | POA: Diagnosis not present

## 2020-03-10 DIAGNOSIS — D3500 Benign neoplasm of unspecified adrenal gland: Secondary | ICD-10-CM | POA: Diagnosis not present

## 2020-03-10 LAB — COMPREHENSIVE METABOLIC PANEL
ALT: 33 U/L (ref 0–44)
AST: 22 U/L (ref 15–41)
Albumin: 4.3 g/dL (ref 3.5–5.0)
Alkaline Phosphatase: 73 U/L (ref 38–126)
Anion gap: 7 (ref 5–15)
BUN: 13 mg/dL (ref 6–20)
CO2: 28 mmol/L (ref 22–32)
Calcium: 9 mg/dL (ref 8.9–10.3)
Chloride: 107 mmol/L (ref 98–111)
Creatinine, Ser: 0.8 mg/dL (ref 0.61–1.24)
GFR calc Af Amer: >60 mL/min (ref >60)
GFR calc non Af Amer: >60 mL/min (ref >60)
Glucose, Bld: 117 mg/dL — ABNORMAL HIGH (ref 70–99)
Potassium: 4.1 mmol/L (ref 3.5–5.1)
Sodium: 142 mmol/L (ref 135–145)
Total Bilirubin: 0.5 mg/dL (ref 0.3–1.2)
Total Protein: 7.3 g/dL (ref 6.5–8.1)

## 2020-03-10 LAB — CBC WITH DIFFERENTIAL/PLATELET
Abs Immature Granulocytes: 0.02 10*3/uL (ref 0.00–0.07)
Basophils Absolute: 0.1 10*3/uL (ref 0.0–0.1)
Basophils Relative: 1 %
Eosinophils Absolute: 0.2 10*3/uL (ref 0.0–0.5)
Eosinophils Relative: 2 %
HCT: 45.8 % (ref 39.0–52.0)
Hemoglobin: 15.4 g/dL (ref 13.0–17.0)
Immature Granulocytes: 0 %
Lymphocytes Relative: 36 %
Lymphs Abs: 2.4 10*3/uL (ref 0.7–4.0)
MCH: 31.8 pg (ref 26.0–34.0)
MCHC: 33.6 g/dL (ref 30.0–36.0)
MCV: 94.6 fL (ref 80.0–100.0)
Monocytes Absolute: 0.7 10*3/uL (ref 0.1–1.0)
Monocytes Relative: 11 %
Neutro Abs: 3.4 10*3/uL (ref 1.7–7.7)
Neutrophils Relative %: 50 %
Platelets: 269 10*3/uL (ref 150–400)
RBC: 4.84 MIL/uL (ref 4.22–5.81)
RDW: 13.2 % (ref 11.5–15.5)
WBC: 6.6 10*3/uL (ref 4.0–10.5)
nRBC: 0 % (ref 0.0–0.2)

## 2020-03-10 LAB — URINALYSIS, COMPLETE (UACMP) WITH MICROSCOPIC
Bacteria, UA: NONE SEEN
Bilirubin Urine: NEGATIVE
Glucose, UA: NEGATIVE mg/dL
Hgb urine dipstick: NEGATIVE
Ketones, ur: NEGATIVE mg/dL
Leukocytes,Ua: NEGATIVE
Nitrite: NEGATIVE
Protein, ur: 30 mg/dL — AB
Specific Gravity, Urine: 1.024 (ref 1.005–1.030)
Squamous Epithelial / HPF: NONE SEEN (ref 0–5)
pH: 5 (ref 5.0–8.0)

## 2020-03-10 LAB — LIPASE, BLOOD: Lipase: 46 U/L (ref 11–51)

## 2020-03-10 MED ORDER — HYDROCODONE-ACETAMINOPHEN 5-325 MG PO TABS: 1.0000 | ORAL_TABLET | ORAL | 0 refills | Status: DC | PRN

## 2020-03-10 MED ORDER — KETOROLAC TROMETHAMINE 60 MG/2ML IM SOLN
60.0000 mg | Freq: Once | INTRAMUSCULAR | Status: AC
  Administered 2020-03-10: 60 mg via INTRAMUSCULAR
  Filled 2020-03-10: qty 2

## 2020-03-10 NOTE — ED Provider Notes (Signed)
[facility] Emergency Department Provider Note  Time seen: 8:34 AM  I have reviewed the triage vital signs and the nursing notes.   HISTORY  Chief Complaint Flank Pain   HPI Joshua [name] is a 59 y.o. male with a past medical history of seizure disorder, neuropathy, prior CVA, presents to the emergency department for right flank pain. According to the patient over the past several days he has been experiencing moderate sharp pain in his right flank somewhat worse with movement especially twisting. Patient denies any history of kidney stones. Denies any lifting or movement that might of exacerbated the pain. Patient states some nausea yesterday but denies any vomiting. No diarrhea no dysuria but states he had experienced some darker urine.   Past Medical History:  Diagnosis Date  . Brain tumor ([place]) 02/20/2013   brain tumor removed in March 2014, Dr Donald Pore  . Diffuse idiopathic skeletal hyperostosis 06/05/2013  . Dizziness   . Enlarged prostate   . Headache(784.0)    scattered  . 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[place]) 03/07/2019  . Meningioma ([place])   . Meningioma, recurrent of brain ([place]) 01/31/2019  . Neuropathy 12/05/2019  . Seizures ([place])    11/27/19 last sz 1 wk ago  . Stroke [facility])     Patient Active Problem List   Diagnosis Date Noted  . Neuropathy 12/05/2019  . 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 Seizures ([place]) 01/31/2019  . Meningioma, recurrent of brain ([place]) 01/31/2019  . Meningioma ([place]) 01/03/2019  . Brain tumor ([place]) 12/27/2018  . Diffuse idiopathic skeletal hyperostosis 06/05/2013    Past Surgical History:  Procedure Laterality Date  . APPLICATION OF CRANIAL NAVIGATION N/A 01/10/2019   Procedure: APPLICATION OF CRANIAL NAVIGATION;  Surgeon: Erline Levine, MD;  Location: [place];  Service: Neurosurgery;  Laterality: N/A;  . CATARACT EXTRACTION Right   . CRANIOTOMY Right 11/06/2012   Procedure:  CRANIOTOMY TUMOR EXCISION;  Surgeon: Erline Levine, MD;  Location: [place] [facility];  Service: Neurosurgery;  Laterality: Right;  Right Parasagittal craniotomy for meningioma with Stealth  . CRANIOTOMY Right 01/10/2019   Procedure: Right Parasagittal Craniotomy for Tumor;  Surgeon: Erline Levine, MD;  Location: [place];  Service: Neurosurgery;  Laterality: Right;  Right parasagittal craniotomy for tumor  . ESOPHAGOGASTRODUODENOSCOPY    . right knee arthroscopy      Prior to Admission medications   Medication Sig Start Date End Date Taking? Authorizing Provider  acetaminophen (TYLENOL) 500 MG tablet Take 1,000 mg by mouth 2 (two) times daily as needed for headache (pain).     [provider]  aspirin EC 81 MG EC tablet Take 1 tablet (81 mg total) by mouth daily. 01/14/19   Erline Levine, MD  atorvastatin (LIPITOR) 20 MG tablet Take 1 tablet (20 mg total) by mouth daily. 11/26/19   Charlott Rakes, MD  clonazePAM (KLONOPIN) 0.5 MG tablet TAKE 1 TABLET(0.5 MG) BY MOUTH TWICE DAILY Patient taking differently: Take 0.5 mg by mouth 2 (two) times daily.  12/12/19   Vaslow, Acey Lav, MD  cyclobenzaprine (FLEXERIL) 5 MG tablet Take 1 tablet (5 mg total) by mouth 2 (two) times daily as needed for muscle spasms. 02/23/20   Recardo Evangelist, PA-C  FIBER ADULT GUMMIES PO Take 2 tablets by mouth daily.     [provider]  gabapentin (NEURONTIN) 300 MG capsule Take 1 capsule (300 mg total) by mouth 2 (two) times daily. Patient not taking: Reported on 02/04/2020  12/03/19   [name], MD  Lacosamide 100 MG TABS Take 2 tablets (200 mg total) by mouth in the morning and at bedtime. 02/04/20   [name], MD  lamoTRIgine (LAMICTAL) 100 MG tablet Take 2 tablets (200 mg total) by mouth 2 (two) times daily. 02/04/20   Vaslow, Acey Lav, MD  lidocaine (LIDODERM) 5 % Place 1 patch onto the skin daily. Remove & Discard patch within 12 hours or as directed by MD Patient taking differently: Place 1  patch onto the skin daily as needed (pain). Remove & Discard patch within 12 hours or as directed by MD 02/23/20   Recardo Evangelist, PA-C  LORazepam (ATIVAN) 1 MG tablet Take 1 tablet (1 mg total) by mouth every 8 (eight) hours as needed for seizure (for breakthrough seizures). Patient not taking: Reported on 02/04/2020 12/26/19   [name], MD  mirtazapine (REMERON) 15 MG tablet Take 15 mg by mouth at bedtime as needed (Sleep).  Patient not taking: Reported on 02/04/2020    [provider]  omeprazole (PRILOSEC) 20 MG capsule Take 1 capsule (20 mg total) by mouth daily. Patient not taking: Reported on 02/04/2020 12/06/19   Loura Halt A, NP  ondansetron (ZOFRAN-ODT) 4 MG disintegrating tablet Take 1 tablet (4 mg total) by mouth every 8 (eight) hours as needed for nausea or vomiting. 02/04/20   [name], MD  vitamin C (ASCORBIC ACID) 500 MG tablet Take 500 mg by mouth daily.    [provider]    No Known Allergies  Family History  Problem Relation Age of Onset  . Hypertension Mother   . Dementia Mother   . Hypertension Father   . CAD Father   . CAD Brother     Social History Social History   Tobacco Use  . Smoking status: Former Smoker    Types: Cigarettes    Quit date: 01/29/2019    Years since quitting: 1.1  . Smokeless tobacco: Never Used  . Tobacco comment: hasn't smoked in a week  Vaping Use  . Vaping Use: Never used  Substance Use Topics  . Alcohol use: Not Currently    Alcohol/week: 8.0 standard drinks    Types: 6 Cans of beer, 2 Shots of liquor per week    Comment: occasionally, 11/27/19 none  . Drug use: No    Review of Systems Constitutional: Negative for fever. Cardiovascular: Negative for chest pain. Respiratory: Negative for shortness of breath. Gastrointestinal: Right flank pain, nausea. Negative for vomiting or diarrhea. Genitourinary: Darker appearing urine without dysuria per patient Musculoskeletal: Negative for  musculoskeletal complaints Neurological: Negative for headache All other ROS negative  ____________________________________________   PHYSICAL EXAM:  VITAL SIGNS: ED Triage Vitals  Enc Vitals Group     BP 03/10/20 0529 (!) 142/79     Pulse Rate 03/10/20 0529 77     Resp 03/10/20 0529 18     Temp 03/10/20 0529 98 F (36.7 C)     Temp Source 03/10/20 0529 Oral     SpO2 03/10/20 0529 97 %     Weight 03/10/20 0527 180 lb (81.6 kg)     Height 03/10/20 0527 5\' 8"  (1.727 m)     Head Circumference --      Peak Flow --      Pain Score 03/10/20 0527 9     Pain Loc --      Pain Edu? --      Excl. in [place]? --  Constitutional: Alert and oriented. Well appearing and in no distress. Eyes: Normal exam ENT      Head: Normocephalic and atraumatic.      Mouth/Throat: Mucous membranes are moist. Cardiovascular: Normal rate, regular rhythm. Respiratory: Normal respiratory effort without tachypnea nor retractions. Breath sounds are clear  Gastrointestinal: Soft, right mid tenderness to palpation, mild, mild right CVA tenderness otherwise benign exam. Musculoskeletal: Nontender with normal range of motion in all extremities.  Neurologic:  Normal speech and language. No gross focal neurologic deficits  Skin:  Skin is warm, dry and intact.  Psychiatric: Mood and affect are normal. S  ____________________________________________   RADIOLOGY  IMPRESSION:  1. Stable left adrenal adenoma.  2. Stable mild prostatic enlargement.  3. Small fat containing periumbilical hernia.  4. No acute abnormality seen in the abdomen or pelvis.  5. Aortic atherosclerosis.   ____________________________________________   INITIAL IMPRESSION / ASSESSMENT AND PLAN / ED COURSE  Pertinent labs & imaging results that were available during my care of the patient were reviewed by me and considered in my medical decision making (see chart for details).   Patient presents emergency department for right flank  pain, states moderate sharp pain worse with movement. Differential would include musculoskeletal pain, kidney stone, UTI or pyelonephritis, colitis or diverticulitis.  Patient's labs are reassuring including a normal urinalysis. We will proceed with a CT renal scan to further evaluate. Patient agreeable to plan of care. We will dose Toradol for his discomfort.  CT scan is essentially negative for acute abnormality.  Will discharge with a short course of pain medication and have the patient follow-up with his doctor.  Patient agreeable to plan of care.  Discussed my typical abdominal pain return precautions.  DAILON SHEERAN was evaluated in Emergency Department on 03/10/2020 for the symptoms described in the history of present illness. He was evaluated in the context of the global COVID-19 pandemic, which necessitated consideration that the patient might be at risk for infection with the SARS-CoV-2 virus that causes COVID-19. Institutional protocols and algorithms that pertain to the evaluation of patients at risk for COVID-19 are in a state of rapid change based on information released by regulatory bodies including the CDC and federal and state organizations. These policies and algorithms were followed during the patient's care in the ED.  ____________________________________________   FINAL CLINICAL IMPRESSION(S) / ED DIAGNOSES  Right flank pain   Harvest Dark, MD 03/10/20 [phone]

## 2020-03-10 NOTE — ED Triage Notes (Signed)
Patient ambulatory to triage with steady gait, without difficulty or distress noted; pt reports rt lateral pain accomp by nausea this am; denies any abd or back pain; denies hx of same

## 2020-03-10 NOTE — ED Notes (Signed)
Patient transported to CT 

## 2020-03-12 ENCOUNTER — Telehealth: Payer: Self-pay | Admitting: Family Medicine

## 2020-03-12 NOTE — Telephone Encounter (Signed)
Erroneous encounter

## 2020-03-16 ENCOUNTER — Other Ambulatory Visit: Payer: Self-pay

## 2020-03-16 ENCOUNTER — Emergency Department
Admission: EM | Admit: 2020-03-16 | Discharge: 2020-03-17 | Disposition: A | Discharge status: Home / Self Care | Payer: Medicaid Other | Attending: Emergency Medicine | Admitting: Emergency Medicine

## 2020-03-16 ENCOUNTER — Emergency Department: Payer: Medicaid Other

## 2020-03-16 DIAGNOSIS — W01198A Fall on same level from slipping, tripping and stumbling with subsequent striking against other object, initial encounter: Secondary | ICD-10-CM | POA: Insufficient documentation

## 2020-03-16 DIAGNOSIS — R42 Dizziness and giddiness: Secondary | ICD-10-CM | POA: Insufficient documentation

## 2020-03-16 DIAGNOSIS — Z7982 Long term (current) use of aspirin: Secondary | ICD-10-CM | POA: Diagnosis not present

## 2020-03-16 DIAGNOSIS — Y999 Unspecified external cause status: Secondary | ICD-10-CM | POA: Insufficient documentation

## 2020-03-16 DIAGNOSIS — W19XXXA Unspecified fall, initial encounter: Secondary | ICD-10-CM

## 2020-03-16 DIAGNOSIS — M542 Cervicalgia: Secondary | ICD-10-CM | POA: Insufficient documentation

## 2020-03-16 DIAGNOSIS — F17210 Nicotine dependence, cigarettes, uncomplicated: Secondary | ICD-10-CM | POA: Diagnosis not present

## 2020-03-16 DIAGNOSIS — M25511 Pain in right shoulder: Secondary | ICD-10-CM | POA: Insufficient documentation

## 2020-03-16 DIAGNOSIS — Z79899 Other long term (current) drug therapy: Secondary | ICD-10-CM | POA: Insufficient documentation

## 2020-03-16 DIAGNOSIS — Y939 Activity, unspecified: Secondary | ICD-10-CM | POA: Diagnosis not present

## 2020-03-16 DIAGNOSIS — Y929 Unspecified place or not applicable: Secondary | ICD-10-CM | POA: Insufficient documentation

## 2020-03-16 DIAGNOSIS — Z85841 Personal history of malignant neoplasm of brain: Secondary | ICD-10-CM | POA: Insufficient documentation

## 2020-03-16 DIAGNOSIS — T424X5A Adverse effect of benzodiazepines, initial encounter: Secondary | ICD-10-CM | POA: Diagnosis not present

## 2020-03-16 DIAGNOSIS — Z043 Encounter for examination and observation following other accident: Secondary | ICD-10-CM | POA: Diagnosis not present

## 2020-03-16 DIAGNOSIS — M19011 Primary osteoarthritis, right shoulder: Secondary | ICD-10-CM | POA: Diagnosis not present

## 2020-03-16 LAB — BASIC METABOLIC PANEL
Anion gap: 8 (ref 5–15)
BUN: 14 mg/dL (ref 6–20)
CO2: 24 mmol/L (ref 22–32)
Calcium: 9 mg/dL (ref 8.9–10.3)
Chloride: 107 mmol/L (ref 98–111)
Creatinine, Ser: 0.84 mg/dL (ref 0.61–1.24)
GFR calc Af Amer: >60 mL/min (ref >60)
GFR calc non Af Amer: >60 mL/min (ref >60)
Glucose, Bld: 109 mg/dL — ABNORMAL HIGH (ref 70–99)
Potassium: 3.9 mmol/L (ref 3.5–5.1)
Sodium: 139 mmol/L (ref 135–145)

## 2020-03-16 LAB — CBC
HCT: 44.2 % (ref 39.0–52.0)
Hemoglobin: 15.5 g/dL (ref 13.0–17.0)
MCH: 31.7 pg (ref 26.0–34.0)
MCHC: 35.1 g/dL (ref 30.0–36.0)
MCV: 90.4 fL (ref 80.0–100.0)
Platelets: 299 10*3/uL (ref 150–400)
RBC: 4.89 MIL/uL (ref 4.22–5.81)
RDW: 13.2 % (ref 11.5–15.5)
WBC: 8 10*3/uL (ref 4.0–10.5)
nRBC: 0 % (ref 0.0–0.2)

## 2020-03-16 MED ORDER — LAMOTRIGINE 100 MG PO TABS
100.0000 mg | ORAL_TABLET | Freq: Once | ORAL | Status: AC
  Administered 2020-03-17: 100 mg via ORAL
  Filled 2020-03-16: qty 1

## 2020-03-16 NOTE — ED Provider Notes (Signed)
[facility] Emergency Department Provider Note   ____________________________________________   First MD Initiated Contact with Patient 03/16/20 2300     (approximate)  I have reviewed the triage vital signs and the nursing notes.   HISTORY  Chief Complaint Fall    HPI Joshua [name] is a 59 y.o. male with past medical history of meningioma status post resection and seizure disorder who presents to the ED complaining of fall.  Patient reports that he was feeling lightheaded and unsteady on his feet this afternoon around 1 PM and fell to the ground, striking his head and right shoulder.  He denies losing consciousness and does not take any blood thinners.  He complains of some pain at the base of his neck as well is along his right shoulder, but he has been able to walk since the fall and denies any vision changes, speech changes, numbness, or weakness.  He is concerned that the pharmacy accidentally filled 2 bottles of clonazepam rather than 1 bottle of clonazepam and 1 bottle of his Lamictal.  He had his prescriptions.  5 days ago and noticed that both bottles contained the same pill, although he has been taking his Vimpat without difficulty.  He denies any seizure-like episode today, has not taken what he believes to be the clonazepam since this morning.  He did speak with his pharmacy earlier today, who confirmed that there was another bottle of Lamictal they are waiting for him to pick it up.        Past Medical History:  Diagnosis Date  . Brain tumor ([place]) 02/20/2013   brain tumor removed in March 2014, Dr Donald Pore  . Diffuse idiopathic skeletal hyperostosis 06/05/2013  . Dizziness   . Enlarged prostate   . Headache(784.0)    scattered  . 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[place]) 03/07/2019  . Meningioma ([place])   . Meningioma, recurrent of brain ([place]) 01/31/2019  . Neuropathy 12/05/2019  . Seizures ([place])    11/27/19 last sz 1 wk ago  . Stroke  [facility])     Patient Active Problem List   Diagnosis Date Noted  . Neuropathy 12/05/2019  . 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[place]) 03/07/2019  . Seizures ([place]) 01/31/2019  . Meningioma, recurrent of brain ([place]) 01/31/2019  . Meningioma ([place]) 01/03/2019  . Brain tumor ([place]) 12/27/2018  . Diffuse idiopathic skeletal hyperostosis 06/05/2013    Past Surgical History:  Procedure Laterality Date  . APPLICATION OF CRANIAL NAVIGATION N/A 01/10/2019   Procedure: APPLICATION OF CRANIAL NAVIGATION;  Surgeon: Erline Levine, MD;  Location: [place];  Service: Neurosurgery;  Laterality: N/A;  . CATARACT EXTRACTION Right   . CRANIOTOMY Right 11/06/2012   Procedure: CRANIOTOMY TUMOR EXCISION;  Surgeon: Erline Levine, MD;  Location: [place] [facility];  Service: Neurosurgery;  Laterality: Right;  Right Parasagittal craniotomy for meningioma with Stealth  . CRANIOTOMY Right 01/10/2019   Procedure: Right Parasagittal Craniotomy for Tumor;  Surgeon: Erline Levine, MD;  Location: [place];  Service: Neurosurgery;  Laterality: Right;  Right parasagittal craniotomy for tumor  . ESOPHAGOGASTRODUODENOSCOPY    . right knee arthroscopy      Prior to Admission medications   Medication Sig Start Date End Date Taking? Authorizing Provider  acetaminophen (TYLENOL) 500 MG tablet Take 1,000 mg by mouth 2 (two) times daily as needed for headache (pain).     [provider]  aspirin EC 81 MG EC tablet Take 1 tablet (81 mg total) by mouth daily.  01/14/19   Erline Levine, MD  atorvastatin (LIPITOR) 20 MG tablet Take 1 tablet (20 mg total) by mouth daily. 11/26/19   Charlott Rakes, MD  clonazePAM (KLONOPIN) 0.5 MG tablet TAKE 1 TABLET(0.5 MG) BY MOUTH TWICE DAILY Patient taking differently: Take 0.5 mg by mouth 2 (two) times daily.  12/12/19   Vaslow, Acey Lav, MD  cyclobenzaprine (FLEXERIL) 5 MG tablet Take 1 tablet (5 mg total) by mouth 2 (two) times daily as needed for muscle spasms. 02/23/20   Recardo Evangelist, PA-C   FIBER ADULT GUMMIES PO Take 2 tablets by mouth daily.     [provider]  gabapentin (NEURONTIN) 300 MG capsule Take 1 capsule (300 mg total) by mouth 2 (two) times daily. Patient not taking: Reported on 02/04/2020 12/03/19   [name], MD  HYDROcodone-acetaminophen (NORCO/VICODIN) 5-325 MG tablet Take 1 tablet by mouth every 4 (four) hours as needed. 03/10/20   Harvest Dark, MD  Lacosamide 100 MG TABS Take 2 tablets (200 mg total) by mouth in the morning and at bedtime. 02/04/20   [name], MD  lamoTRIgine (LAMICTAL) 100 MG tablet Take 2 tablets (200 mg total) by mouth 2 (two) times daily. 02/04/20   Vaslow, Acey Lav, MD  lidocaine (LIDODERM) 5 % Place 1 patch onto the skin daily. Remove & Discard patch within 12 hours or as directed by MD Patient taking differently: Place 1 patch onto the skin daily as needed (pain). Remove & Discard patch within 12 hours or as directed by MD 02/23/20   Recardo Evangelist, PA-C  LORazepam (ATIVAN) 1 MG tablet Take 1 tablet (1 mg total) by mouth every 8 (eight) hours as needed for seizure (for breakthrough seizures). Patient not taking: Reported on 02/04/2020 12/26/19   [name], MD  mirtazapine (REMERON) 15 MG tablet Take 15 mg by mouth at bedtime as needed (Sleep).  Patient not taking: Reported on 02/04/2020    [provider]  omeprazole (PRILOSEC) 20 MG capsule Take 1 capsule (20 mg total) by mouth daily. Patient not taking: Reported on 02/04/2020 12/06/19   Loura Halt A, NP  ondansetron (ZOFRAN-ODT) 4 MG disintegrating tablet Take 1 tablet (4 mg total) by mouth every 8 (eight) hours as needed for nausea or vomiting. 02/04/20   [name], MD  vitamin C (ASCORBIC ACID) 500 MG tablet Take 500 mg by mouth daily.    [provider]    Allergies Patient has no known allergies.  Family History  Problem Relation Age of Onset  . Hypertension Mother   . Dementia Mother   . Hypertension Father   . CAD  Father   . CAD Brother     Social History Social History   Tobacco Use  . Smoking status: Former Smoker    Types: Cigarettes    Quit date: 01/29/2019    Years since quitting: 1.1  . Smokeless tobacco: Never Used  . Tobacco comment: hasn't smoked in a week  Vaping Use  . Vaping Use: Never used  Substance Use Topics  . Alcohol use: Not Currently    Alcohol/week: 8.0 standard drinks    Types: 6 Cans of beer, 2 Shots of liquor per week    Comment: occasionally, 11/27/19 none  . Drug use: No    Review of Systems  Constitutional: No fever/chills Eyes: No visual changes. ENT: No sore throat. Cardiovascular: Denies chest pain. Respiratory: Denies shortness of breath. Gastrointestinal: No abdominal pain.  No nausea, no vomiting.  No diarrhea.  No constipation. Genitourinary: Negative for dysuria. Musculoskeletal: Negative for back pain.  Positive for right shoulder pain and neck pain. Skin: Negative for rash. Neurological: Negative for headaches, focal weakness or numbness.  ____________________________________________   PHYSICAL EXAM:  VITAL SIGNS: ED Triage Vitals  Enc Vitals Group     BP 03/16/20 1307 129/80     Pulse Rate 03/16/20 1307 86     Resp 03/16/20 1307 16     Temp 03/16/20 1307 99.2 F (37.3 C)     Temp Source 03/16/20 1307 Oral     SpO2 03/16/20 1307 98 %     Weight 03/16/20 1310 185 lb (83.9 kg)     Height 03/16/20 1310 5\' 8"  (1.727 m)     Head Circumference --      Peak Flow --      Pain Score 03/16/20 1310 9     Pain Loc --      Pain Edu? --      Excl. in [place]? --     Constitutional: Alert and oriented. Eyes: Conjunctivae are normal. Head: Atraumatic. Nose: No congestion/rhinnorhea. Mouth/Throat: Mucous membranes are moist. Neck: Normal ROM Cardiovascular: Normal rate, regular rhythm. Grossly normal heart sounds.  2+ radial pulses bilaterally. Respiratory: Normal respiratory effort.  No retractions. Lungs CTAB. Gastrointestinal: Soft and  nontender. No distention. Genitourinary: deferred Musculoskeletal: No lower extremity tenderness nor edema.  Diffuse tenderness to right shoulder with no obvious deformity.  Otherwise no tenderness to bilateral upper and lower extremities. Neurologic:  Normal speech and language. No gross focal neurologic deficits are appreciated. Skin:  Skin is warm, dry and intact. No rash noted. Psychiatric: Mood and affect are normal. Speech and behavior are normal.  ____________________________________________   LABS (all labs ordered are listed, but only abnormal results are displayed)  Labs Reviewed  BASIC METABOLIC PANEL - Abnormal; Notable for the following components:      Result Value   Glucose, Bld 109 (*)    All other components within normal limits  CBC   ____________________________________________  EKG  ED ECG REPORT I, Blake Divine, the attending physician, personally viewed and interpreted this ECG.   Date: 03/16/2020  EKG Time: 13:15  Rate: 76  Rhythm: normal sinus rhythm  Axis: Normal  Intervals:left posterior fascicular block  ST&T Change: None    PROCEDURES  Procedure(s) performed (including Critical Care):  Procedures   ____________________________________________   INITIAL IMPRESSION / ASSESSMENT AND PLAN / ED COURSE       59 year old male with past medical history of meningioma status post resection and seizure disorder on Lamictal and Vimpat who presents to the ED following fall earlier this morning striking his head and his right shoulder.  CT scans of his head and C-spine are negative for acute process, we will add on x-ray of shoulder however there is no obvious deformity.  He has no other apparent injuries related to the fall and EKG as well as labs are reassuring.  Patient declines any pain medication at this time.  Does appear his fall is likely related to overmedication with clonazepam due to pharmacy error.  He has both pill bottles here in the ED  and the bottle labeled Lamictal contains pills identical to bottle labeled clonazepam with pill neurology consistent with that of clonazepam.  Patient was advised to stop taking any of the medication from the Lamictal bottle and he states he has Lamictal available at the pharmacy for him to pick it up in the morning.  We will give his usual evening dose of Lamictal here in the ED and he has not had any evidence of seizure activity.  If x-ray of right shoulder is negative for acute process, patient will be appropriate for discharge home with outpatient follow-up.  X-ray of right shoulder is negative for acute process.  Patient is appropriate for discharge home as he exhibits no signs of benzodiazepine toxicity at this time.  He was again counseled to pick up his Lamictal in the morning and avoid taking any further extra clonazepam.  He was counseled to return to the ED for new worsening symptoms, patient agrees with plan.      ____________________________________________   FINAL CLINICAL IMPRESSION(S) / ED DIAGNOSES  Final diagnoses:  Fall, initial encounter  Adverse effect of clonazepam, initial encounter     ED Discharge Orders    None       Note:  This document was prepared using Dragon voice recognition software and may include unintentional dictation errors.   Blake Divine, MD 03/17/20 0005

## 2020-03-16 NOTE — ED Notes (Signed)
Pt states he had a fall earlier today around 1pm. Pt states his antiseizure medications were mixed up at the pharmacy and they gave him the wrong medications in the wrong bottle. Pt states he half way blacked out hitting back, neck, head and right shoulder.

## 2020-03-16 NOTE — ED Triage Notes (Signed)
Pt to ED for chief complaint of fall this morning, reports dizziness after taking seizure medications today which caused fall. Denies changes in seizure medications, denies having a seizure today.  Denies hitting head, LOC. Reports taking ASA daily.  Pt alert and oriented, clear speech, NAD noted.  Reports pain in back and neck since fall.

## 2020-03-17 ENCOUNTER — Other Ambulatory Visit: Payer: Medicaid Other

## 2020-03-17 ENCOUNTER — Other Ambulatory Visit: Payer: Self-pay | Admitting: *Deleted

## 2020-03-17 MED ORDER — LAMOTRIGINE 100 MG PO TABS
200.0000 mg | ORAL_TABLET | Freq: Two times a day (BID) | ORAL | 0 refills | Status: DC
Start: 2020-03-17 — End: 2020-04-06

## 2020-03-19 ENCOUNTER — Telehealth: Payer: Self-pay

## 2020-03-19 NOTE — Telephone Encounter (Signed)
Received a call from front desk receptionist stating patient is in the lobby requesting to speak to Dr. Renda Rolls nurse. Asked receptionist to ask patient why he is requesting to see nurse. Per receptionist patient stated he "needs a letter about Lamictal and Klonopin". This nurse called the pharmacy to inquire about meds and per pharmacist patient already picked up Lamictal prescription that was sent in on 8/31. Klonopin prescription was last sent to [facility] in [place] on 12/12/19 with three refills. Out to lobby to talk to patient and patient requested a letter from this office stating that [facility] in [place], [place] gave him the wrong prescription. Asked patient, what do you mean? Patient stated "pharmacy placed Klonopin in the Lamictal bottle". Asked patient if he contacted the pharmacy about what he says happened and he said "no, they are just going to switch it out" I want you to write a letter saying they made a mistake". Informed patient that this nurse cannot confirm what happened with prescriptions once they were picked up from the pharmacy. Patient began to raise his voice and this nurse asked him to come back to speak in the office. At this point, this nurse asked Erskine Emery, RN to participate in discussion. Patient explained to Manya Silvas, RN that he wanted a letter stating the pharmacy made a mistake by putting Lamictal in the Klonopin bottle. Manya Silvas, RN explained to the patient that once he leaves the pharmacy with medication this office cannot verify what meds were in the bottle at the time of picking up the prescription. Patient stood up and said, "I will have my lawyer contact you" and left. Dr. Mickeal Skinner made aware of what occurred.

## 2020-03-20 ENCOUNTER — Other Ambulatory Visit: Payer: Self-pay

## 2020-03-20 ENCOUNTER — Encounter: Payer: Self-pay | Admitting: Family Medicine

## 2020-03-20 ENCOUNTER — Ambulatory Visit: Payer: Medicaid Other | Attending: Family Medicine | Admitting: Family Medicine

## 2020-03-20 ENCOUNTER — Telehealth: Payer: Self-pay | Admitting: *Deleted

## 2020-03-20 VITALS — BP 154/90 | HR 80 | Temp 96.6°F | Resp 16 | Ht 69.0 in | Wt 197.0 lb

## 2020-03-20 DIAGNOSIS — M5442 Lumbago with sciatica, left side: Secondary | ICD-10-CM

## 2020-03-20 DIAGNOSIS — W19XXXS Unspecified fall, sequela: Secondary | ICD-10-CM | POA: Diagnosis not present

## 2020-03-20 DIAGNOSIS — Z79899 Other long term (current) drug therapy: Secondary | ICD-10-CM | POA: Insufficient documentation

## 2020-03-20 DIAGNOSIS — F064 Anxiety disorder due to known physiological condition: Secondary | ICD-10-CM | POA: Insufficient documentation

## 2020-03-20 DIAGNOSIS — Z87891 Personal history of nicotine dependence: Secondary | ICD-10-CM | POA: Insufficient documentation

## 2020-03-20 DIAGNOSIS — F439 Reaction to severe stress, unspecified: Secondary | ICD-10-CM | POA: Insufficient documentation

## 2020-03-20 DIAGNOSIS — N400 Enlarged prostate without lower urinary tract symptoms: Secondary | ICD-10-CM | POA: Diagnosis not present

## 2020-03-20 DIAGNOSIS — Z09 Encounter for follow-up examination after completed treatment for conditions other than malignant neoplasm: Secondary | ICD-10-CM

## 2020-03-20 DIAGNOSIS — Z8673 Personal history of transient ischemic attack (TIA), and cerebral infarction without residual deficits: Secondary | ICD-10-CM | POA: Insufficient documentation

## 2020-03-20 DIAGNOSIS — G40909 Epilepsy, unspecified, not intractable, without status epilepticus: Secondary | ICD-10-CM | POA: Diagnosis present

## 2020-03-20 DIAGNOSIS — F430 Acute stress reaction: Secondary | ICD-10-CM

## 2020-03-20 DIAGNOSIS — M545 Low back pain: Secondary | ICD-10-CM | POA: Diagnosis not present

## 2020-03-20 DIAGNOSIS — Z7982 Long term (current) use of aspirin: Secondary | ICD-10-CM | POA: Insufficient documentation

## 2020-03-20 DIAGNOSIS — R030 Elevated blood-pressure reading, without diagnosis of hypertension: Secondary | ICD-10-CM | POA: Diagnosis not present

## 2020-03-20 DIAGNOSIS — M25511 Pain in right shoulder: Secondary | ICD-10-CM | POA: Diagnosis not present

## 2020-03-20 DIAGNOSIS — Z8249 Family history of ischemic heart disease and other diseases of the circulatory system: Secondary | ICD-10-CM | POA: Insufficient documentation

## 2020-03-20 DIAGNOSIS — Z9181 History of falling: Secondary | ICD-10-CM | POA: Insufficient documentation

## 2020-03-20 DIAGNOSIS — F411 Generalized anxiety disorder: Secondary | ICD-10-CM | POA: Diagnosis not present

## 2020-03-20 DIAGNOSIS — M542 Cervicalgia: Secondary | ICD-10-CM

## 2020-03-20 MED ORDER — ACETAMINOPHEN-CODEINE #3 300-30 MG PO TABS: 1.0000 | ORAL_TABLET | Freq: Four times a day (QID) | ORAL | 0 refills | Status: AC | PRN

## 2020-03-20 NOTE — Progress Notes (Signed)
New Patient Office Visit  Subjective:  Patient ID: Joshua [name], male    DOB: October 31, 1960  Age: 59 y.o. MRN: [id number]  CC: No chief complaint on file. See Nurses's note  HPI LEANTHONY RHETT, 59 year old African-American male with history of meningioma status post resection in 2014 and seizure disorder who is status post emergency department visit on 03/16/2020 secondary to a fall, who presents to establish care.  Patient has had virtual visit on 01/08/2020 with another provider here due to gastroenteritis.  Patient was seen 03/16/2020 at [facility] emergency department after feeling unsteady and lightheaded and suffering a fall in which he struck his head and right shoulder.  CT scan was negative for acute process.  Patient had concerned that pharmacy had dispensed the wrong medication and that he was taking too much medication which caused him to be dizzy, off-balance and caused him to fall at home and again on 03/16/2020 prompting his visit to the ED.  (Patient has medications bottles with him, one with a label for clonazepam and a bottle labelled as  Lamotrigine from [facility]. Both bottles contained light yellow medications that appear identical with identical markers on back of each tablet. Medications were also reviewed by clinical pharmacy intern who also contacted pharmacy to confirm that both medications were clonazepam).        Patient states that on 03/16/2020 while he was attempting to open his car door and get into his car, he felt very dizzy/shaky and despite the use of his cane, as he was pulling open the car door, he lost his balance and fell backwards onto a hard clay surface, onto his back, head and right side. Since that time he has had right shoulder pain and decreased ability to lift his right arm at the shoulder. He describes shoulder pain as sharp and around a 9 on a 0-to-10 scale. He has tried over-the-counter Tylenol without relief of the shoulder pain. He additionally  since his fall has awakened at approximately 3 AM nightly with posterior neck pain which he describes as sharp and also a 9 on a 0-to-10 scale and not relieved with use of Tylenol. He additionally has low back pain and feels as if he has increased numbness in his left leg. Prior to his fall, he has had chronic numbness/neuropathy in the left lower leg but now has similar pain and numbness in the left thigh. He reports that he has spoken with his neurologist who called in a refill of his seizure medication, Lamictal to a different pharmacy which patient has picked up. Patient is very concerned and anxious after receiving the wrong medication from his previous pharmacy. He reports that he did contact [facility] to report that he had been given 2 bottles of the same medication, clonazepam as he had received one bottle of clonazepam and one bottle labeled Lamictal/lamotrigine but both pills were the same. Patient states that his neurologist told him that taking the extra clonazepam likely contributed to patient being dizzy/off balance and patient is also afraid that the medication area may precipitate a seizure which would then cause patient to be unable to drive. Patient feels very anxious. Past Medical History:  Diagnosis Date  . Brain tumor ([place]) 02/20/2013   brain tumor removed in March 2014, Dr Donald Pore  . Diffuse idiopathic skeletal hyperostosis 06/05/2013  . Dizziness   . Enlarged prostate   . Headache(784.0)    scattered  . 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[place]) 03/07/2019  .  Meningioma ([place])   . Meningioma, recurrent of brain ([place]) 01/31/2019  . Neuropathy 12/05/2019  . Seizures ([place])    11/27/19 last sz 1 wk ago  . Stroke [facility])     Past Surgical History:  Procedure Laterality Date  . APPLICATION OF CRANIAL NAVIGATION N/A 01/10/2019   Procedure: APPLICATION OF CRANIAL NAVIGATION;  Surgeon: Erline Levine, MD;  Location: [place];  Service: Neurosurgery;  Laterality: N/A;  . CATARACT  EXTRACTION Right   . CRANIOTOMY Right 11/06/2012   Procedure: CRANIOTOMY TUMOR EXCISION;  Surgeon: Erline Levine, MD;  Location: [place] NEURO ORS;  Service: Neurosurgery;  Laterality: Right;  Right Parasagittal craniotomy for meningioma with Stealth  . CRANIOTOMY Right 01/10/2019   Procedure: Right Parasagittal Craniotomy for Tumor;  Surgeon: Erline Levine, MD;  Location: [place];  Service: Neurosurgery;  Laterality: Right;  Right parasagittal craniotomy for tumor  . ESOPHAGOGASTRODUODENOSCOPY    . right knee arthroscopy      Family History  Problem Relation Age of Onset  . Hypertension Mother   . Dementia Mother   . Hypertension Father   . CAD Father   . CAD Brother     Social History   Socioeconomic History  . Marital status: Married    Spouse name: Not on file  . Number of children: 2  . Years of education: Not on file  . Highest education level: Not on file  Occupational History  . Not on file  Tobacco Use  . Smoking status: Former Smoker    Types: Cigarettes    Quit date: 01/29/2019    Years since quitting: 1.1  . Smokeless tobacco: Never Used  . Tobacco comment: hasn't smoked in a week  Vaping Use  . Vaping Use: Never used  Substance and Sexual Activity  . Alcohol use: Not Currently    Alcohol/week: 8.0 standard drinks    Types: 6 Cans of beer, 2 Shots of liquor per week    Comment: occasionally, 11/27/19 none  . Drug use: No  . Sexual activity: Yes  Other Topics Concern  . Not on file  Social History Narrative   Lives with wife   Caffeine- soda occass   Social Determinants of Health   Financial Resource Strain:   . Difficulty of Paying Living Expenses: Not on file  Food Insecurity:   . Worried About Charity fundraiser in the Last Year: Not on file  . Ran Out of Food in the Last Year: Not on file  Transportation Needs:   . Lack of Transportation (Medical): Not on file  . Lack of Transportation (Non-Medical): Not on file  Physical Activity:   . Days of Exercise  per Week: Not on file  . Minutes of Exercise per Session: Not on file  Stress:   . Feeling of Stress : Not on file  Social Connections:   . Frequency of Communication with Friends and Family: Not on file  . Frequency of Social Gatherings with Friends and Family: Not on file  . Attends Religious Services: Not on file  . Active Member of Clubs or Organizations: Not on file  . Attends Archivist Meetings: Not on file  . Marital Status: Not on file  Intimate Partner Violence:   . Fear of Current or Ex-Partner: Not on file  . Emotionally Abused: Not on file  . Physically Abused: Not on file  . Sexually Abused: Not on file    ROS Review of Systems  Constitutional: Positive for fatigue. Negative for  chills.  HENT: Negative for sore throat and trouble swallowing.   Respiratory: Negative for cough and shortness of breath.   Cardiovascular: Negative for chest pain and palpitations.  Gastrointestinal: Negative for abdominal pain, constipation, diarrhea and nausea.  Endocrine: Negative for polydipsia, polyphagia and polyuria.  Genitourinary: Negative for dysuria and frequency.  Musculoskeletal: Positive for arthralgias, back pain, gait problem and neck pain.  Skin: Negative for rash and wound.  Neurological: Positive for dizziness, numbness (left leg) and headaches (actually pain is in posterior neck).  Hematological: Negative for adenopathy. Does not bruise/bleed easily.  Psychiatric/Behavioral: Positive for sleep disturbance. The patient is nervous/anxious.     Objective:   Today's Vitals: BP (!) 153/94   Pulse 80   Temp (!) 96.6 F (35.9 C)   Resp 16   Ht 5\' 9"  (1.753 m)   Wt 197 lb (89.4 kg)   SpO2 97%   BMI 29.09 kg/m   Physical Exam Vitals and nursing note reviewed.  Constitutional:      General: He is not in acute distress.    Appearance: Normal appearance.     Comments: WNWD older male in NAD who appears fatigued who is sitting on a chair in exam room holding  onto a cane  HENT:     Head: Normocephalic and atraumatic.  Neck:     Vascular: No carotid bruit.     Comments: Posterior neck pain from C4-6 to palp on exam and bilateral cervical paraspinoius/trapezius muscle spasm right greater than left Cardiovascular:     Rate and Rhythm: Normal rate and regular rhythm.  Pulmonary:     Effort: Pulmonary effort is normal.     Breath sounds: Normal breath sounds.  Abdominal:     Palpations: Abdomen is soft.     Tenderness: There is no abdominal tenderness. There is no guarding.  Musculoskeletal:     Cervical back: Tenderness present.     Right lower leg: Edema present.     Left lower leg: Edema present.     Comments: Chronic appearing mild distal LE edema; decreased range of motion of the right arm at the shoulder with inability to lift  right arm/shoulder greater than horizontal.  Positive empty can sign.  Lumbosacral tenderness to palpation and positive seated left leg raise  Lymphadenopathy:     Cervical: No cervical adenopathy.  Skin:    General: Skin is warm and dry.  Neurological:     Mental Status: He is alert and oriented to person, place, and time.  Psychiatric:     Comments: Anxious mood and slightly tearful over concerns regarding recent issue with medication error at pharmacy      [place]:  1. Encounter for examination following treatment at hospital 2. Seizure disorder ([place]); 8.fall, sequela; 4.  Acute right shoulder pain; 5.  Acute bilateral low back pain with radiation to the left leg; 5.  Posterior neck pain 59 year old male who is status post emergency department visit on 03/16/2020 after falling while attempting to get into his car.  Patient reports that since that time he has had continued acute pain in the right shoulder as well as low back pain with numbness in the left upper thigh.  It appears that patient may have been given clonazepam accidentally instead of Lamictal in addition to his regular prescription for  clonazepam which led to patient having issues with balance, gait and causing patient to have increased somnolence which led to his recent fall.  He reports that he  has since picked up a prescription for his Lamictal which he had his neurologist sent to a different pharmacy as he no longer trust the pharmacy that may have made the medication error.  Patient does have follow-up with neurology regarding his seizure disorder and recent mixup in medications however his appointment is not until October and he is encouraged to see if he can get a sooner appointment as he continues to feel abnormal and is concerned that he may have a seizure due to the mixup in his medications.  He additionally continues to have acute right shoulder pain in addition to acute back pain with left leg radiation and posterior neck pain for which he will be referred to orthopedics for further evaluation and treatment as a suspect that patient may have rotator cuff pathology.  Patient's emergency department notes from 03/16/2020 were reviewed at today's visit.  Prescription provided for Tylenol 3 with codeine to help with acute pain as patient reports that regular Tylenol has not been effective. - AMB referral to orthopedics - acetaminophen-codeine (TYLENOL #3) 300-30 MG tablet; Take 1-2 tablets by mouth every 6 (six) hours as needed for up to 5 days for moderate pain.  Dispense: 30 tablet; Refill: 0  6. Anxiety as acute reaction to exceptional stress He reports increased anxiety secondary to concern regarding recent medication mixup as well as recent fall with injury.  He request to speak with the social worker at today's visit and new referral was placed for follow-up with medical social worker for counseling regarding his acute anxiety.  7. Elevated blood pressure reading Patient with elevated blood pressure reading but does not appear to have prior diagnosis of hypertension.  He is to follow-up with his primary care provider for blood  pressure recheck.  Patient was anxious at today's visit as well as having issues with pain both which may have contributed to elevations in blood pressure.   Outpatient Encounter Medications as of 03/20/2020  Medication Sig  . acetaminophen (TYLENOL) 500 MG tablet Take 1,000 mg by mouth 2 (two) times daily as needed for headache (pain).   Marland Kitchen aspirin EC 81 MG EC tablet Take 1 tablet (81 mg total) by mouth daily.  Marland Kitchen atorvastatin (LIPITOR) 20 MG tablet Take 1 tablet (20 mg total) by mouth daily.  . clonazePAM (KLONOPIN) 0.5 MG tablet TAKE 1 TABLET(0.5 MG) BY MOUTH TWICE DAILY (Patient taking differently: Take 0.5 mg by mouth 2 (two) times daily. )  . cyclobenzaprine (FLEXERIL) 5 MG tablet Take 1 tablet (5 mg total) by mouth 2 (two) times daily as needed for muscle spasms.  Marland Kitchen HYDROcodone-acetaminophen (NORCO/VICODIN) 5-325 MG tablet Take 1 tablet by mouth every 4 (four) hours as needed.  . Lacosamide 100 MG TABS Take 2 tablets (200 mg total) by mouth in the morning and at bedtime.  . lamoTRIgine (LAMICTAL) 100 MG tablet Take 2 tablets (200 mg total) by mouth 2 (two) times daily.  Marland Kitchen omeprazole (PRILOSEC) 20 MG capsule Take 1 capsule (20 mg total) by mouth daily.  Marland Kitchen FIBER ADULT GUMMIES PO Take 2 tablets by mouth daily.   Marland Kitchen gabapentin (NEURONTIN) 300 MG capsule Take 1 capsule (300 mg total) by mouth 2 (two) times daily. (Patient not taking: Reported on 02/04/2020)  . lidocaine (LIDODERM) 5 % Place 1 patch onto the skin daily. Remove & Discard patch within 12 hours or as directed by MD (Patient taking differently: Place 1 patch onto the skin daily as needed (pain). Remove & Discard patch within 12  hours or as directed by MD)  . LORazepam (ATIVAN) 1 MG tablet Take 1 tablet (1 mg total) by mouth every 8 (eight) hours as needed for seizure (for breakthrough seizures). (Patient not taking: Reported on 02/04/2020)  . mirtazapine (REMERON) 15 MG tablet Take 15 mg by mouth at bedtime as needed (Sleep).  (Patient not  taking: Reported on 02/04/2020)  . ondansetron (ZOFRAN-ODT) 4 MG disintegrating tablet Take 1 tablet (4 mg total) by mouth every 8 (eight) hours as needed for nausea or vomiting.  . vitamin C (ASCORBIC ACID) 500 MG tablet Take 500 mg by mouth daily.   No facility-administered encounter medications on file as of 03/20/2020.    Follow-up:  Return in about 3 weeks (around 04/10/2020) for joint pain/anxiety.  Antony Blackbird, MD

## 2020-03-20 NOTE — Telephone Encounter (Signed)
Patient requested his chiropractor to be in [place] instead of [place]

## 2020-03-20 NOTE — Progress Notes (Signed)
Has concerns about medications. States pharmacist dispensed same medication in 2 different pill bottles  Taking too much of the medication cause him to fall. Has had multiple falls  Pain in back and shoulder Pain 9/10  Lives with sister who helps with ADL PHQ-9 and GAD 7 elevated

## 2020-03-21 ENCOUNTER — Other Ambulatory Visit: Payer: Self-pay

## 2020-03-21 ENCOUNTER — Emergency Department
Admission: EM | Admit: 2020-03-21 | Discharge: 2020-03-21 | Discharge status: Against Medical Advice | Payer: Medicaid Other

## 2020-03-21 NOTE — ED Triage Notes (Signed)
Waiting to hear from pharmacy to see if prescription can be transferred before check pt in.

## 2020-03-25 ENCOUNTER — Encounter: Payer: Self-pay | Admitting: Family

## 2020-03-25 ENCOUNTER — Ambulatory Visit (INDEPENDENT_AMBULATORY_CARE_PROVIDER_SITE_OTHER): Payer: Medicaid Other | Admitting: Family

## 2020-03-25 ENCOUNTER — Ambulatory Visit (INDEPENDENT_AMBULATORY_CARE_PROVIDER_SITE_OTHER): Payer: Medicaid Other

## 2020-03-25 DIAGNOSIS — M545 Low back pain, unspecified: Secondary | ICD-10-CM

## 2020-03-25 DIAGNOSIS — M5442 Lumbago with sciatica, left side: Secondary | ICD-10-CM | POA: Diagnosis not present

## 2020-03-25 DIAGNOSIS — M7541 Impingement syndrome of right shoulder: Secondary | ICD-10-CM | POA: Diagnosis not present

## 2020-03-25 DIAGNOSIS — M79606 Pain in leg, unspecified: Secondary | ICD-10-CM

## 2020-03-25 MED ORDER — LIDOCAINE HCL 1 % IJ SOLN
5.0000 mL | INTRAMUSCULAR | Status: AC | PRN
  Administered 2020-03-25: 5 mL

## 2020-03-25 MED ORDER — METHYLPREDNISOLONE ACETATE 40 MG/ML IJ SUSP
40.0000 mg | INTRAMUSCULAR | Status: AC | PRN
  Administered 2020-03-25: 40 mg via INTRA_ARTICULAR

## 2020-03-25 MED ORDER — PREDNISONE 50 MG PO TABS: ORAL_TABLET | ORAL | 0 refills | Status: DC

## 2020-03-25 NOTE — Progress Notes (Signed)
Office Visit Note   Patient: Joshua [name]           Date of Birth: 04/01/1961           MRN: [id number] Visit Date: 03/25/2020              Requested by: Antony Blackbird, MD [place],  [place] [address] PCP: Charlott Rakes, MD  No chief complaint on file.     HPI: The patient is a 59 year old gentleman for initial evaluation of 2 separate issues.Marland Kitchen  Unfortunately the patient had a fall a little over a week ago he believes the fall was due to taking duplicate of his Lamictal.  He has been feeling out of it for quite some time and then fell.  He does weight-bear with a cane.  States that he fell landing on his backside was using the cane and the pressure of pushing with the right hand on his cane he felt felt something pop in his right shoulder  Today complaining of anterior right shoulder pain difficulty with range of motion discomfort reaching above shoulder level.  Cannot reach behind his back without pain.  No numbness tingling no neck pain associated with this  Also having low back pain with right-sided radicular symptoms.  Some central and some left-sided low back pain.  Radiation and anterior left thigh some burning.  No new weakness he does have some weakness in his left lower extremity which he states has been ongoing for several years following surgery  Assessment & Plan: Visit Diagnoses:  1. Low back pain radiating down leg   2. Acute left-sided low back pain with left-sided sciatica   3. Impingement syndrome of right shoulder     Plan: Depo-Medrol injection of the right shoulder.  Patient tolerated well.  We will trial a prednisone burst for his radicular symptoms of the low back he will follow-up in the office in 3 weeks  Follow-Up Instructions: Return in about 3 weeks (around 04/15/2020).   Back Exam   Tenderness  The patient is experiencing tenderness in the lumbar.  Range of Motion  The patient has normal back ROM.  Muscle Strength  Right  Quadriceps:  5/5  Left Quadriceps:  3/5  Right Hamstrings:  5/5  Left Hamstrings:  4/5   Tests  Straight leg raise right: positive Straight leg raise left: positive  Reflexes  Patellar: normal  Other  Gait: abnormal    Right Shoulder Exam   Tenderness  The patient is experiencing tenderness in the biceps tendon.  Range of Motion  The patient has normal right shoulder ROM.  Tests  Impingement: positive Drop arm: positive  Other  Erythema: absent Pulse: present      Patient is alert, oriented, no adenopathy, well-dressed, normal affect, normal respiratory effort.  Imaging: No results found. No images are attached to the encounter.  Prior right hip total arthroplasty. No evidence of hardware fracture or failure. No periprosthetic complications. Postsurgical changes noted in the soft tissues of the right hip. Multilevel degenerative changes are present in the imaged portions of the spine. Large bridging osteophyte seen laterally in the upper lumbar spine more pronounced discogenic changes in the lower lumbar levels with slight retrolisthesis L5-S1 and moderate to severe neural foraminal narrowing at this level. No associated spondylolysis.  Labs: Lab Results  Component Value Date   HGBA1C 6.2 (H) 12/03/2019   REPTSTATUS 02/28/2020 FINAL 02/27/2020   CULT MULTIPLE SPECIES PRESENT, SUGGEST RECOLLECTION (A) 02/27/2020  Lab Results  Component Value Date   ALBUMIN 4.3 03/10/2020   ALBUMIN 4.7 02/27/2020   ALBUMIN 4.6 12/10/2019    Lab Results  Component Value Date   MG 1.8 07/09/2019   No results found for: VD25OH  No results found for: PREALBUMIN CBC EXTENDED Latest Ref Rng & Units 03/16/2020 03/10/2020 02/29/2020  WBC 4.0 - 10.5 K/uL 8.0 6.6 8.0  RBC 4.22 - 5.81 MIL/uL 4.89 4.84 5.00  HGB 13.0 - 17.0 g/dL 15.5 15.4 15.5  HCT 39 - 52 % 44.2 45.8 47.6  PLT 150 - 400 K/uL 299 269 287  NEUTROABS 1.7 - 7.7 K/uL - 3.4 -  LYMPHSABS 0.7 - 4.0 K/uL -  2.4 -     There is no height or weight on file to calculate BMI.  Orders:  Orders Placed This Encounter  Procedures  . Large Joint Inj  . XR Lumbar Spine 2-3 Views   No orders of the defined types were placed in this encounter.    Procedures: Large Joint Inj: R subacromial bursa on 03/25/2020 3:04 PM Indications: pain Details: 22 G 1.5 in needle Medications: 5 mL lidocaine 1 %; 40 mg methylPREDNISolone acetate 40 MG/ML Consent was given by the patient.      Clinical Data: No additional findings.  ROS:  All other systems negative, except as noted in the HPI. Review of Systems  Constitutional: Negative for chills and fever.  Musculoskeletal: Positive for arthralgias, back pain and myalgias.  Neurological: Positive for numbness. Negative for weakness.    Objective: Vital Signs: There were no vitals taken for this visit.  Specialty Comments:  No specialty comments available.  PMFS History: Patient Active Problem List   Diagnosis Date Noted  . Neuropathy 12/05/2019  . 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[place]) 03/07/2019  . Seizures ([place]) 01/31/2019  . Meningioma, recurrent of brain ([place]) 01/31/2019  . Meningioma ([place]) 01/03/2019  . Brain tumor ([place]) 12/27/2018  . Diffuse idiopathic skeletal hyperostosis 06/05/2013   Past Medical History:  Diagnosis Date  . Brain tumor ([place]) 02/20/2013   brain tumor removed in March 2014, Dr Donald Pore  . Diffuse idiopathic skeletal hyperostosis 06/05/2013  . Dizziness   . Enlarged prostate   . Headache(784.0)    scattered  . 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[place]) 03/07/2019  . Meningioma ([place])   . Meningioma, recurrent of brain ([place]) 01/31/2019  . Neuropathy 12/05/2019  . Seizures ([place])    11/27/19 last sz 1 wk ago  . Stroke [facility])     Family History  Problem Relation Age of Onset  . Hypertension Mother   . Dementia Mother   . Hypertension Father   . CAD Father   . CAD Brother     Past Surgical History:    Procedure Laterality Date  . APPLICATION OF CRANIAL NAVIGATION N/A 01/10/2019   Procedure: APPLICATION OF CRANIAL NAVIGATION;  Surgeon: Erline Levine, MD;  Location: [place];  Service: Neurosurgery;  Laterality: N/A;  . CATARACT EXTRACTION Right   . CRANIOTOMY Right 11/06/2012   Procedure: CRANIOTOMY TUMOR EXCISION;  Surgeon: Erline Levine, MD;  Location: [place] NEUR[facility] ORS;  Service: Neurosurgery;  Laterality: Right;  Right Parasagittal craniotomy for meningioma with Stealth  . CRANIOTOMY Right 01/10/2019   Procedure: Right Parasagittal Craniotomy for Tumor;  Surgeon: Erline Levine, MD;  Location: [place];  Service: Neurosurgery;  Laterality: Right;  Right parasagittal craniotomy for tumor  . ESOPHAGOGASTRODUODENOSCOPY    . right knee arthroscopy     Social  History   Occupational History  . Not on file  Tobacco Use  . Smoking status: Former Smoker    Types: Cigarettes    Quit date: 01/29/2019    Years since quitting: 1.1  . Smokeless tobacco: Never Used  . Tobacco comment: hasn't smoked in a week  Vaping Use  . Vaping Use: Never used  Substance and Sexual Activity  . Alcohol use: Not Currently    Alcohol/week: 8.0 standard drinks    Types: 6 Cans of beer, 2 Shots of liquor per week    Comment: occasionally, 11/27/19 none  . Drug use: No  . Sexual activity: Yes

## 2020-03-26 NOTE — Telephone Encounter (Signed)
I do not understand either. He saw [facility] yesterday and I am not sure if they discussed this with him. He needs to contact them.

## 2020-03-27 NOTE — Telephone Encounter (Signed)
I Was unable to talk to patient but I lvm  to contact us or [facility]  Thank you

## 2020-03-31 ENCOUNTER — Telehealth: Payer: Self-pay

## 2020-03-31 DIAGNOSIS — Z23 Encounter for immunization: Secondary | ICD-10-CM | POA: Diagnosis not present

## 2020-03-31 NOTE — Telephone Encounter (Signed)
Received TC from patient wanting to know if he had an appointment with Dr Mickeal Skinner on 9/26 because he no longer has Mychart? I let him know that yes he does have an appointment @9am  with Dr Mauri Reading on 04/12/20. Also let patient know that when he comes in for his appointment to make sure that he gets a printed copy of his future appointments. Pt verbalized understanding. Added to appointment notes on 9/26 for nurse to print out pt a copy of schedule as well.

## 2020-04-01 ENCOUNTER — Encounter: Payer: Self-pay | Admitting: Family

## 2020-04-01 ENCOUNTER — Ambulatory Visit (INDEPENDENT_AMBULATORY_CARE_PROVIDER_SITE_OTHER): Payer: Medicaid Other | Admitting: Family

## 2020-04-01 VITALS — Ht 69.0 in | Wt 197.0 lb

## 2020-04-01 DIAGNOSIS — M545 Low back pain, unspecified: Secondary | ICD-10-CM

## 2020-04-01 DIAGNOSIS — M7541 Impingement syndrome of right shoulder: Secondary | ICD-10-CM | POA: Diagnosis not present

## 2020-04-01 DIAGNOSIS — M79606 Pain in leg, unspecified: Secondary | ICD-10-CM

## 2020-04-01 NOTE — Progress Notes (Signed)
Office Visit Note   Patient: Joshua [name]           Date of Birth: June 26, 1961           MRN: [id number] Visit Date: 04/01/2020              Requested by: Charlott Rakes, MD [place],  [place] [id number] PCP: Charlott Rakes, MD  Chief Complaint  Patient presents with  . Right Shoulder - Follow-up    S/p cortisone injection 03/25/20   . Lower Back - Follow-up      HPI: The patient is a 59 year old gentleman seen in follow up for 2 separate issues.Marland Kitchen  Unfortunately the patient had a fall a little a couple weeks ago he believes the fall was due to taking duplicate of his Lamictal.  He has been feeling out of it for quite some time and then fell.  He does weight-bear with a cane.  States that he fell landing on his backside was using the cane and the pressure of pushing with the right hand on his cane he felt felt something pop in his right shoulder.  Today is seen in follow-up with shoulder pain.  Pain is in the anterior shoulder this radiates down his bicep.  He is having pain with above head and behind back reaching.  No numbness tingling no neck pain associated with this.  Did have Depo-Medrol injection about 1 week ago this provided some relief for a few days now is feeling the same as he had prior to the injection.  He is quite eager to figure out what exactly is going on.  Also having continued low back pain with right-sided radicular symptoms.  Some central and some left-sided low back pain.  Radiation and anterior left thigh some burning.  No new weakness he does have some weakness in his left lower extremity which he states has been ongoing for several years following surgery.  This is unchanged from last week.  He states he only took a couple days worth of prednisone that was prescribed felt this upset stomach he has a history of a peptic ulcer is concerned that this may worsen it.  Assessment & Plan: Visit Diagnoses:  1. Low back pain radiating down leg   2.  Impingement syndrome of right shoulder     Plan: We will proceed with MRI of the right shoulder.  Will refer to Dr. Ernestina Patches for evaluation of possible ESI of the lumbar spine.  Follow-Up Instructions: Return in about 2 weeks (around 04/15/2020).   Back Exam   Tenderness  The patient is experiencing tenderness in the lumbar.  Range of Motion  The patient has normal back ROM.  Muscle Strength  Right Quadriceps:  5/5  Left Quadriceps:  3/5  Right Hamstrings:  5/5  Left Hamstrings:  4/5   Tests  Straight leg raise right: positive Straight leg raise left: positive  Reflexes  Patellar: normal  Other  Gait: abnormal    Right Shoulder Exam   Tenderness  The patient is experiencing tenderness in the biceps tendon.  Range of Motion  The patient has normal right shoulder ROM.  Tests  Impingement: positive Drop arm: positive  Other  Erythema: absent Pulse: present      Patient is alert, oriented, no adenopathy, well-dressed, normal affect, normal respiratory effort.  Imaging: No results found. No images are attached to the encounter.  Prior right hip total arthroplasty. No evidence of hardware fracture or failure.  No periprosthetic complications. Postsurgical changes noted in the soft tissues of the right hip. Multilevel degenerative changes are present in the imaged portions of the spine. Large bridging osteophyte seen laterally in the upper lumbar spine more pronounced discogenic changes in the lower lumbar levels with slight retrolisthesis L5-S1 and moderate to severe neural foraminal narrowing at this level. No associated spondylolysis.  Labs: Lab Results  Component Value Date   HGBA1C 6.2 (H) 12/03/2019   REPTSTATUS 02/28/2020 FINAL 02/27/2020   CULT MULTIPLE SPECIES PRESENT, SUGGEST RECOLLECTION (A) 02/27/2020     Lab Results  Component Value Date   ALBUMIN 4.3 03/10/2020   ALBUMIN 4.7 02/27/2020   ALBUMIN 4.6 12/10/2019    Lab Results    Component Value Date   MG 1.8 07/09/2019   No results found for: VD25OH  No results found for: PREALBUMIN CBC EXTENDED Latest Ref Rng & Units 03/16/2020 03/10/2020 02/29/2020  WBC 4.0 - 10.5 K/uL 8.0 6.6 8.0  RBC 4.22 - 5.81 MIL/uL 4.89 4.84 5.00  HGB 13.0 - 17.0 g/dL 15.5 15.4 15.5  HCT 39 - 52 % 44.2 45.8 47.6  PLT 150 - 400 K/uL 299 269 287  NEUTROABS 1.7 - 7.7 K/uL - 3.4 -  LYMPHSABS 0.7 - 4.0 K/uL - 2.4 -     Body mass index is 29.09 kg/m.  Orders:  Orders Placed This Encounter  Procedures  . MR SHOULDER RIGHT WO CONTRAST  . Ambulatory referral to Physical Medicine Rehab   No orders of the defined types were placed in this encounter.    Procedures: No procedures performed  Clinical Data: No additional findings.  ROS:  All other systems negative, except as noted in the HPI. Review of Systems  Constitutional: Negative for chills and fever.  Musculoskeletal: Positive for arthralgias, back pain and myalgias.  Neurological: Positive for numbness. Negative for weakness.    Objective: Vital Signs: Ht 5\' 9"  (1.753 m)   Wt 197 lb (89.4 kg)   BMI 29.09 kg/m   Specialty Comments:  No specialty comments available.  PMFS History: Patient Active Problem List   Diagnosis Date Noted  . Neuropathy 12/05/2019  . 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 Seizures ([place]) 01/31/2019  . Meningioma, recurrent of brain ([place]) 01/31/2019  . Meningioma ([place]) 01/03/2019  . Brain tumor ([place]) 12/27/2018  . Diffuse idiopathic skeletal hyperostosis 06/05/2013   Past Medical History:  Diagnosis Date  . Brain tumor ([place]) 02/20/2013   brain tumor removed in March 2014, Dr Donald Pore  . Diffuse idiopathic skeletal hyperostosis 06/05/2013  . Dizziness   . Enlarged prostate   . Headache(784.0)    scattered  . 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 Meningioma ([place])   . Meningioma, recurrent of brain ([place]) 01/31/2019  . Neuropathy 12/05/2019   . Seizures ([place])    11/27/19 last sz 1 wk ago  . Stroke [facility])     Family History  Problem Relation Age of Onset  . Hypertension Mother   . Dementia Mother   . Hypertension Father   . CAD Father   . CAD Brother     Past Surgical History:  Procedure Laterality Date  . APPLICATION OF CRANIAL NAVIGATION N/A 01/10/2019   Procedure: APPLICATION OF CRANIAL NAVIGATION;  Surgeon: Erline Levine, MD;  Location: [facility];  Service: Neurosurgery;  Laterality: N/A;  . CATARACT EXTRACTION Right   . CRANIOTOMY Right 11/06/2012   Procedure: CRANIOTOMY TUMOR EXCISION;  Surgeon: Erline Levine, MD;  Location: [facility];  Service: Neurosurgery;  Laterality: Right;  Right Parasagittal craniotomy for meningioma with [facility]  . CRANIOTOMY Right 01/10/2019   Procedure: Right Parasagittal Craniotomy for Tumor;  Surgeon: Erline Levine, MD;  Location: [facility];  Service: Neurosurgery;  Laterality: Right;  Right parasagittal craniotomy for tumor  . ESOPHAGOGASTRODUODENOSCOPY    . right knee arthroscopy     Social History   Occupational History  . Not on file  Tobacco Use  . Smoking status: Former Smoker    Types: Cigarettes    Quit date: 01/29/2019    Years since quitting: 1.1  . Smokeless tobacco: Never Used  . Tobacco comment: hasn't smoked in a week  Vaping Use  . Vaping Use: Never used  Substance and Sexual Activity  . Alcohol use: Not Currently    Alcohol/week: 8.0 standard drinks    Types: 6 Cans of beer, 2 Shots of liquor per week    Comment: occasionally, 11/27/19 none  . Drug use: No  . Sexual activity: Yes

## 2020-04-02 ENCOUNTER — Telehealth: Payer: Self-pay | Admitting: Family Medicine

## 2020-04-02 ENCOUNTER — Telehealth: Payer: Self-pay | Admitting: Orthopedic Surgery

## 2020-04-02 DIAGNOSIS — H539 Unspecified visual disturbance: Secondary | ICD-10-CM

## 2020-04-02 NOTE — Telephone Encounter (Signed)
Patient has been called and informed of referral being placed.

## 2020-04-02 NOTE — Telephone Encounter (Signed)
Done

## 2020-04-02 NOTE — Telephone Encounter (Signed)
Patient called requesting a call back from Dr. Jess Barters nurse. Patient states he is nor clear of back injections. Patient has a couple of medical questions. Please call patient at [phone].

## 2020-04-02 NOTE — Telephone Encounter (Signed)
It looks like patient is requesting a referral.   Copied from [place] [phone]. Topic: Referral - Request for Referral >> Apr 02, 2020  9:37 AM Scherrie Gerlach wrote: Pt would like the dr to set him appt with an eye dr before his appt on 9/29 with Dr Chapman Fitch.  Pt states he is having trouble with his vision.  Also there is a hx of vision issues related to diabetes in his family.

## 2020-04-02 NOTE — Telephone Encounter (Signed)
Patient is needing referral to eye doctor.

## 2020-04-03 ENCOUNTER — Telehealth: Payer: Self-pay | Admitting: Orthopedic Surgery

## 2020-04-03 ENCOUNTER — Ambulatory Visit
Admission: RE | Admit: 2020-04-03 | Discharge: 2020-04-03 | Disposition: A | Discharge status: Home / Self Care | Payer: Medicaid Other | Source: Ambulatory Visit | Attending: Internal Medicine | Admitting: Internal Medicine

## 2020-04-03 ENCOUNTER — Other Ambulatory Visit: Payer: Self-pay

## 2020-04-03 DIAGNOSIS — G9389 Other specified disorders of brain: Secondary | ICD-10-CM | POA: Diagnosis not present

## 2020-04-03 DIAGNOSIS — I6782 Cerebral ischemia: Secondary | ICD-10-CM | POA: Diagnosis not present

## 2020-04-03 DIAGNOSIS — R531 Weakness: Secondary | ICD-10-CM | POA: Diagnosis not present

## 2020-04-03 DIAGNOSIS — D320 Benign neoplasm of cerebral meninges: Secondary | ICD-10-CM

## 2020-04-03 DIAGNOSIS — C719 Malignant neoplasm of brain, unspecified: Secondary | ICD-10-CM | POA: Diagnosis not present

## 2020-04-03 MED ORDER — GADOBENATE DIMEGLUMINE 529 MG/ML IV SOLN
20.0000 mL | Freq: Once | INTRAVENOUS | Status: AC | PRN
  Administered 2020-04-03: 20 mL via INTRAVENOUS

## 2020-04-03 NOTE — Telephone Encounter (Signed)
Patient called returning call from Joshua F. Patient states he would like to know if injection administered due to injury from [facility] and what are the injections for or any other chronic pains. Patient will explain more when call returned. Patient phone number is [phone].

## 2020-04-03 NOTE — Telephone Encounter (Signed)
I called and sw pt. He is not interested in having ESI with [facility]. He said that if his back pain was a chronic issue and not related to his fall from his seizure medication then there was no reason for him to pursue treatment that it does not benefit him and his legal issues to have the injection. I again reiterated to the pt that he is having back pain and that he is ok with having this back pain no treated because it does not benefit him in his legal pursuit with his shoulder injury(at visit had discussed suing pharmacy for Lamictal improper refill)  . Pt verbalized that yes that was correct and I advised I would cancel the referral.

## 2020-04-03 NOTE — Telephone Encounter (Signed)
I called and lm on vm to advise to call back with detailed message so that I can relay that to the provider and call back with questions.

## 2020-04-04 ENCOUNTER — Other Ambulatory Visit: Payer: Self-pay | Admitting: Internal Medicine

## 2020-04-06 ENCOUNTER — Ambulatory Visit: Payer: Self-pay | Admitting: Family Medicine

## 2020-04-06 ENCOUNTER — Inpatient Hospital Stay: Payer: Medicaid Other | Attending: Internal Medicine

## 2020-04-06 DIAGNOSIS — D320 Benign neoplasm of cerebral meninges: Secondary | ICD-10-CM | POA: Insufficient documentation

## 2020-04-06 DIAGNOSIS — F419 Anxiety disorder, unspecified: Secondary | ICD-10-CM | POA: Insufficient documentation

## 2020-04-06 DIAGNOSIS — Z87891 Personal history of nicotine dependence: Secondary | ICD-10-CM | POA: Insufficient documentation

## 2020-04-06 DIAGNOSIS — Z8673 Personal history of transient ischemic attack (TIA), and cerebral infarction without residual deficits: Secondary | ICD-10-CM | POA: Insufficient documentation

## 2020-04-06 DIAGNOSIS — G40109 Localization-related (focal) (partial) symptomatic epilepsy and epileptic syndromes with simple partial seizures, not intractable, without status epilepticus: Secondary | ICD-10-CM | POA: Insufficient documentation

## 2020-04-06 DIAGNOSIS — Z79899 Other long term (current) drug therapy: Secondary | ICD-10-CM | POA: Insufficient documentation

## 2020-04-06 DIAGNOSIS — G44209 Tension-type headache, unspecified, not intractable: Secondary | ICD-10-CM | POA: Diagnosis not present

## 2020-04-06 NOTE — Telephone Encounter (Signed)
ATC pt for follow-up and to advise to keep scheduled appt w/ PCP on 9/29 as this a scheduled BP follow-up. Also wanted to advise pt to check BP at home, in the AM 15-30 mins after arising and prior to eating/drinking anything and call clinic with any readings higher than 150/90, no answer, LM to RC.

## 2020-04-06 NOTE — Telephone Encounter (Signed)
Pt states had BP checked at [facility] this afternoon, 131/85 and 133/85. States he went to [facility], BP there 131/85. Advised to call PCP. Reports "This is high for me." Reports mild headache and blurred vision in mornings, "Goes away after I've been up for a while." Denies dizziness, no CP, no SOB. Pt states he has neurologist appt in AM, "I wondered if my doctor wanted to call in some medicine for me."  No BP meds presently. Care advise given per protocol, verbalizes understanding. Advised to continue to monitor, track BP. States he does have home monitor.  Please advise: [phone]  Reason for Disposition . [5] Systolic BP  >= 284 OR Diastolic >= 80 AND [1] not taking BP medications  Answer Assessment - Initial Assessment Questions 1. BLOOD PRESSURE: "What is the blood pressure?" "Did you take at least two measurements 5 minutes apart?"     At [facility]    135/84   133/85 2. ONSET: "When did you take your blood pressure?"     This afternoon 3. HOW: "How did you obtain the blood pressure?" (e.g., visiting nurse, automatic home BP monitor)     At [facility] 4. HISTORY: "Do you have a history of high blood pressure?"     no 5. MEDICATIONS: "Are you taking any medications for blood pressure?" "Have you missed any doses recently?"    No 6. OTHER SYMPTOMS: "Do you have any symptoms?" (e.g., headache, chest pain, blurred vision, difficulty breathing, weakness)     Headaches in mornings, blurred vision in mornings then clear.  Protocols used: BLOOD PRESSURE - HIGH-A-AH

## 2020-04-07 ENCOUNTER — Other Ambulatory Visit: Payer: Self-pay

## 2020-04-07 ENCOUNTER — Inpatient Hospital Stay (HOSPITAL_BASED_OUTPATIENT_CLINIC_OR_DEPARTMENT_OTHER): Payer: Medicaid Other | Admitting: Internal Medicine

## 2020-04-07 VITALS — BP 140/92 | HR 66 | Temp 97.1°F | Resp 18 | Ht 69.0 in | Wt 200.3 lb

## 2020-04-07 DIAGNOSIS — D320 Benign neoplasm of cerebral meninges: Secondary | ICD-10-CM | POA: Diagnosis not present

## 2020-04-07 DIAGNOSIS — Z87891 Personal history of nicotine dependence: Secondary | ICD-10-CM | POA: Diagnosis not present

## 2020-04-07 DIAGNOSIS — C700 Malignant neoplasm of cerebral meninges: Secondary | ICD-10-CM

## 2020-04-07 DIAGNOSIS — F419 Anxiety disorder, unspecified: Secondary | ICD-10-CM | POA: Diagnosis not present

## 2020-04-07 DIAGNOSIS — G40109 Localization-related (focal) (partial) symptomatic epilepsy and epileptic syndromes with simple partial seizures, not intractable, without status epilepticus: Secondary | ICD-10-CM | POA: Diagnosis not present

## 2020-04-07 DIAGNOSIS — R569 Unspecified convulsions: Secondary | ICD-10-CM

## 2020-04-07 DIAGNOSIS — Z79899 Other long term (current) drug therapy: Secondary | ICD-10-CM | POA: Diagnosis not present

## 2020-04-07 DIAGNOSIS — Z8673 Personal history of transient ischemic attack (TIA), and cerebral infarction without residual deficits: Secondary | ICD-10-CM | POA: Diagnosis not present

## 2020-04-07 NOTE — Progress Notes (Signed)
[facility] at [facility] [place], [place] [address] [phone]   Interval Evaluation  Date of Service: 04/07/20 Patient Name: Joshua [name] Patient MRN: [id number] Patient DOB: Nov 15, 1960 Provider: [name], MD  Identifying Statement:  Joshua [name] is a 59 y.o. 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 Vertell Limber (WHO I) 01/10/19: Repeat craniotomy, debulking resection by Dr. Vertell Limber after tumor recurrence, seizures.  Path demonstrates islands of anaplasia c/w grade II/III. 04/05/19: Completes post-operative IMRT with Dr. Lisbeth Renshaw  Interval History:  Joshua [name] presents today for follow up after recent MRI brain. No recurrence of seizures, including sensory auras, in the past month.  Not needing any ativan at this time.  He otherwise maintains compliance with Vimpat, Lamictal, Klonopin.  Continues to go through counseling to help with recent life events.   H+P (02/05/19) Patient presents to review clinical course and care for his meningioma.  Initially he presented in 2014 with headache syndrome, was found to have tumor on imaging which was resected by Dr. Vertell Limber.  The patient was lost to follow up for ~5 years until he developed seizures, described as "twitching of left leg spreading to arm" this past month.  He required loads of Keppra and Dilantin to break events.  MRI demonstrated significant regrowth of the mass, and repeat craniotomy was performed on 01/10/19.  Small amount of residual tumor was visible on post-operative MRI.  Since surgery he has continued to experience numbness and clumsiness of his left leg, requiring cane for ambulation.  He is still pending home physical therapy evaluation.  He has had "small" seizures, consisting of few seconds of shaking of left leg, occurring less than daily.  Continues on Keppra and Dilantin.     Medications: Current Outpatient Medications on File Prior to Visit  Medication Sig Dispense Refill  . aspirin EC 81 MG EC tablet Take 1 tablet (81 mg total) by mouth daily. 30 tablet 3  . atorvastatin (LIPITOR) 20 MG tablet Take 1 tablet (20 mg total) by mouth daily. 30 tablet 3  . clonazePAM (KLONOPIN) 0.5 MG tablet TAKE 1 TABLET(0.5 MG) BY MOUTH TWICE DAILY (Patient taking differently: Take 0.5 mg by mouth 2 (two) times daily. ) 60 tablet 3  . cyclobenzaprine (FLEXERIL) 5 MG tablet Take 1 tablet (5 mg total) by mouth 2 (two) times daily as needed for muscle spasms. 10 tablet 0  . FIBER ADULT GUMMIES PO Take 2 tablets by mouth daily.     Marland Kitchen gabapentin (NEURONTIN) 300 MG capsule Take 1 capsule (300 mg total) by mouth 2 (two) times daily. 60 capsule 3  . Lacosamide 100 MG TABS Take 2 tablets (200 mg total) by mouth in the morning and at bedtime. 60 tablet 3  . lamoTRIgine (LAMICTAL) 100 MG tablet TAKE 2 TABLETS (200 MG TOTAL) BY MOUTH 2 (TWO) TIMES DAILY. 120 tablet 0  . lidocaine (LIDODERM) 5 % Place 1 patch onto the skin daily. Remove & Discard patch within 12 hours or as directed by MD (Patient taking differently: Place 1 patch onto the skin daily as needed (pain). Remove & Discard patch within 12 hours or as directed by MD) 15 patch 0  . mirtazapine (REMERON) 15 MG tablet Take 15 mg by mouth at bedtime as needed (Sleep).     Marland Kitchen omeprazole (PRILOSEC) 20 MG capsule Take 1 capsule (20 mg total) by mouth daily.  30 capsule 1  . ondansetron (ZOFRAN-ODT) 4 MG disintegrating tablet Take 1 tablet (4 mg total) by mouth every 8 (eight) hours as needed for nausea or vomiting. 30 tablet 3  . vitamin C (ASCORBIC ACID) 500 MG tablet Take 500 mg by mouth daily.    Marland Kitchen LORazepam (ATIVAN) 1 MG tablet Take 1 tablet (1 mg total) by mouth every 8 (eight) hours as needed for seizure (for breakthrough seizures). (Patient not taking: Reported on 04/07/2020) 4 tablet 0   No current facility-administered medications on  file prior to visit.    Allergies: No Known Allergies Past Medical History:  Past Medical History:  Diagnosis Date  . Brain tumor ([place]) 02/20/2013   brain tumor removed in March 2014, Dr Donald Pore  . Diffuse idiopathic skeletal hyperostosis 06/05/2013  . Dizziness   . Enlarged prostate   . Headache(784.0)    scattered  . 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[place]) 03/07/2019  . Meningioma ([place])   . Meningioma, recurrent of brain ([place]) 01/31/2019  . Neuropathy 12/05/2019  . Seizures ([place])    11/27/19 last sz 1 wk ago  . Stroke [facility])    Past Surgical History:  Past Surgical History:  Procedure Laterality Date  . APPLICATION OF CRANIAL NAVIGATION N/A 01/10/2019   Procedure: APPLICATION OF CRANIAL NAVIGATION;  Surgeon: Erline Levine, MD;  Location: [facility];  Service: Neurosurgery;  Laterality: N/A;  . CATARACT EXTRACTION Right   . CRANIOTOMY Right 11/06/2012   Procedure: CRANIOTOMY TUMOR EXCISION;  Surgeon: Erline Levine, MD;  Location: [facility];  Service: Neurosurgery;  Laterality: Right;  Right Parasagittal craniotomy for meningioma with [facility]  . CRANIOTOMY Right 01/10/2019   Procedure: Right Parasagittal Craniotomy for Tumor;  Surgeon: Erline Levine, MD;  Location: [facility];  Service: Neurosurgery;  Laterality: Right;  Right parasagittal craniotomy for tumor  . ESOPHAGOGASTRODUODENOSCOPY    . right knee arthroscopy     Social History:  Social History   Socioeconomic History  . Marital status: Married    Spouse name: Not on file  . Number of children: 2  . Years of education: Not on file  . Highest education level: Not on file  Occupational History  . Not on file  Tobacco Use  . Smoking status: Former Smoker    Types: Cigarettes    Quit date: 01/29/2019    Years since quitting: 1.1  . Smokeless tobacco: Never Used  . Tobacco comment: hasn't smoked in a week  Vaping Use  . Vaping Use: Never used  Substance and Sexual Activity  . Alcohol use: Not Currently     Alcohol/week: 8.0 standard drinks    Types: 6 Cans of beer, 2 Shots of liquor per week    Comment: occasionally, 11/27/19 none  . Drug use: No  . Sexual activity: Yes  Other Topics Concern  . Not on file  Social History Narrative   Lives with wife   Caffeine- soda occass   Social Determinants of Health   Financial Resource Strain:   . Difficulty of Paying Living Expenses: Not on file  Food Insecurity:   . Worried About Charity fundraiser in the Last Year: Not on file  . Ran Out of Food in the Last Year: Not on file  Transportation Needs:   . Lack of Transportation (Medical): Not on file  . Lack of Transportation (Non-Medical): Not on file  Physical Activity:   . Days of Exercise per Week: Not on file  . Minutes of Exercise  per Session: Not on file  Stress:   . Feeling of Stress : Not on file  Social Connections:   . Frequency of Communication with Friends and Family: Not on file  . Frequency of Social Gatherings with Friends and Family: Not on file  . Attends Religious Services: Not on file  . Active Member of Clubs or Organizations: Not on file  . Attends Archivist Meetings: Not on file  . Marital Status: Not on file  Intimate Partner Violence:   . Fear of Current or Ex-Partner: Not on file  . Emotionally Abused: Not on file  . Physically Abused: Not on file  . Sexually Abused: Not on file   Family History:  Family History  Problem Relation Age of Onset  . Hypertension Mother   . Dementia Mother   . Hypertension Father   . CAD Father   . CAD Brother     Review of Systems: Constitutional: Denies fevers, chills or abnormal weight loss Eyes: Denies blurriness of vision Ears, nose, mouth, throat, and face: Denies mucositis or sore throat Respiratory: Denies cough, dyspnea or wheezes Cardiovascular: Denies palpitation, chest discomfort or lower extremity swelling Gastrointestinal:  Denies nausea, constipation, diarrhea GU: Denies dysuria or  incontinence Skin: Denies abnormal skin rashes Neurological: Per HPI Musculoskeletal: Denies joint pain, back or neck discomfort. No decrease in ROM Behavioral/Psych: Denies anxiety, disturbance in thought content, and mood instability  Physical Exam: Vitals:   04/07/20 0856  BP: (!) 140/92  Pulse: 66  Resp: 18  Temp: (!) 97.1 F (36.2 C)  SpO2: 99%   KPS: 80. General: Alert, cooperative, pleasant, in no acute distress Head: Craniotomy scar noted, dry and intact. EENT: No conjunctival injection or scleral icterus. Oral mucosa moist Lungs: Resp effort normal Cardiac: Regular rate and rhythm Abdomen: Soft, non-distended abdomen Skin: No rashes cyanosis or petechiae. Extremities: No clubbing or edema  Neurologic Exam: Mental Status: Awake, alert, attentive to examiner. Oriented to self and environment. Language is fluent with intact comprehension.  Cranial Nerves: Visual acuity is grossly normal. Visual fields are full. Extra-ocular movements intact. No ptosis. Face is symmetric, tongue midline. Motor: Tone and bulk are normal.Power is impaired in left leg, 4+/5 distally. Reflexes are symmetric, no pathologic reflexes present. Impaired heel to shin left leg Sensory: Stocking sensory loss Gait: Independent  Labs: I have reviewed the data as listed    Component Value Date/Time   NA 139 03/16/2020 1322   K 3.9 03/16/2020 1322   CL 107 03/16/2020 1322   CO2 24 03/16/2020 1322   GLUCOSE 109 (H) 03/16/2020 1322   BUN 14 03/16/2020 1322   CREATININE 0.84 03/16/2020 1322   CALCIUM 9.0 03/16/2020 1322   PROT 7.3 03/10/2020 0533   ALBUMIN 4.3 03/10/2020 0533   AST 22 03/10/2020 0533   ALT 33 03/10/2020 0533   ALKPHOS 73 03/10/2020 0533   BILITOT 0.5 03/10/2020 0533   GFRNONAA >60 03/16/2020 1322   GFRAA >60 03/16/2020 1322   Lab Results  Component Value Date   WBC 8.0 03/16/2020   NEUTROABS 3.4 03/10/2020   HGB 15.5 03/16/2020   HCT 44.2 03/16/2020   MCV 90.4 03/16/2020    PLT 299 03/16/2020   Imaging:  [place] Clinician Interpretation: I have personally reviewed the CNS images as listed.  My interpretation, in the context of the patient's clinical presentation, is stable disease  DG Shoulder Right  Result Date: 03/16/2020 CLINICAL DATA:  Fall EXAM: RIGHT SHOULDER - 2+ VIEW COMPARISON:  None. FINDINGS: There is no evidence of fracture or dislocation. Findings of a prior Hill-Sachs deformity seen at the posterolateral corner of the humeral head. Moderate glenohumeral joint osteoarthritis is seen with subchondral sclerosis and joint space loss. Soft tissues are unremarkable. IMPRESSION: No acute osseous abnormality. Electronically Signed   By: Prudencio Pair M.D.   On: 03/16/2020 23:52   CT HEAD WO CONTRAST  Result Date: 03/16/2020 CLINICAL DATA:  Fall, dizziness.  History of meningioma resection EXAM: CT HEAD WITHOUT CONTRAST TECHNIQUE: Contiguous axial images were obtained from the base of the skull through the vertex without intravenous contrast. COMPARISON:  02/29/2020 FINDINGS: Brain: No evidence of acute infarction, hemorrhage, hydrocephalus, extra-axial collection or mass lesion/mass effect. Similar degree of encephalomalacia at the right frontoparietal and left parietal lobes near the skull vertex compatible with history of meningioma resection. Vascular: No hyperdense vessel or unexpected calcification. Skull: Sequela of craniotomy at the skull vertex. No acute calvarial fracture or suspicious bone lesion. Sinuses/Orbits: No acute finding. Other: None. IMPRESSION: 1. No acute intracranial findings. 2. Sequela of prior meningioma resection at the skull vertex, unchanged. Electronically Signed   By: Davina Poke D.O.   On: 03/16/2020 13:47   CT Cervical Spine Wo Contrast  Result Date: 03/16/2020 CLINICAL DATA:  Dizziness, fall EXAM: CT CERVICAL SPINE WITHOUT CONTRAST TECHNIQUE: Multidetector CT imaging of the cervical spine was performed without intravenous  contrast. Multiplanar CT image reconstructions were also generated. COMPARISON:  None. FINDINGS: Alignment: Trace degenerative listhesis. Skull base and vertebrae: No acute cervical spine fracture. Soft tissues and spinal canal: No prevertebral fluid or swelling. No visible canal hematoma. Disc levels: Multilevel degenerative changes are present including disc space narrowing, endplate osteophytes, and facet and uncovertebral hypertrophy. Upper chest: Negative. Other: None. IMPRESSION: No acute cervical spine fracture. Electronically Signed   By: Macy Mis M.D.   On: 03/16/2020 14:00   MR BRAIN W WO CONTRAST  Result Date: 04/03/2020 CLINICAL DATA:  Follow-up brain neoplasm blurry vision with some weakness and numbness at the left leg. History of recurrent parafalcine meningioma EXAM: MRI HEAD WITHOUT AND WITH CONTRAST TECHNIQUE: Multiplanar, multiecho pulse sequences of the brain and surrounding structures were obtained without and with intravenous contrast. CONTRAST:  54mL MULTIHANCE GADOBENATE DIMEGLUMINE 529 MG/ML IV SOLN COMPARISON:  02/11/2020 FINDINGS: Brain: Thickening of the upper falx with adjacent frontoparietal encephalomalacia/gliosis. 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 Thickening and somewhat nodular enhancement posteriorly is non progressed. No brain mass effect. No metachronous mass is seen. Chronic small vessel ischemia in the cerebral white matter. No acute or interval infarct, hemorrhage, hydrocephalus, or collection. Vascular: Normal flow voids and vascular enhancements Skull and upper cervical spine: Unremarkable craniotomy at the vertex Sinuses/Orbits: Negative IMPRESSION: Stable appearance of the parafalcine meningioma treatment site. No progression of the sagittal sinus occlusion. Electronically Signed   By: Monte Fantasia M.D.   On: 04/03/2020 10:33   CT Renal Stone Study  Result Date: 03/10/2020 CLINICAL DATA:  Acute right flank pain. EXAM: CT ABDOMEN AND PELVIS  WITHOUT CONTRAST TECHNIQUE: Multidetector CT imaging of the abdomen and pelvis was performed following the standard protocol without IV contrast. COMPARISON:  October 02, 2019. FINDINGS: Lower chest: No acute abnormality. Hepatobiliary: No focal liver abnormality is seen. No gallstones, gallbladder wall thickening, or biliary dilatation. Pancreas: Unremarkable. No pancreatic ductal dilatation or surrounding inflammatory changes. Spleen: Normal in size without focal abnormality. Adrenals/Urinary Tract: Stable left adrenal adenoma is noted. Right adrenal gland is unremarkable. No hydronephrosis or renal obstruction is  noted. No renal or ureteral calculi are noted. Urinary bladder is unremarkable. Stomach/Bowel: Stomach is within normal limits. Appendix appears normal. No evidence of bowel wall thickening, distention, or inflammatory changes. Vascular/Lymphatic: Aortic atherosclerosis. No enlarged abdominal or pelvic lymph nodes. Reproductive: Stable mild prostatic enlargement is noted. Other: Small fat containing periumbilical hernia. No ascites is noted. Musculoskeletal: No acute or significant osseous findings. IMPRESSION: 1. Stable left adrenal adenoma. 2. Stable mild prostatic enlargement. 3. Small fat containing periumbilical hernia. 4. No acute abnormality seen in the abdomen or pelvis. 5. Aortic atherosclerosis. Aortic Atherosclerosis (ICD10-I70.0). Electronically Signed   By: Marijo Conception M.D.   On: 03/10/2020 08:50   XR Lumbar Spine 2-3 Views  Result Date: 03/25/2020 Radiographs of lumbar spine without acute finding. Does have widespread degenerative changes consistent with findings of 3/21 CT scan of abdomen and pelvis including osteophyte to left lateral lumbar spine level of L4-L5. No spondylosis.   Assessment/Plan 1. Meningioma, recurrent of brain ([place])  2. Seizures [facility])  Mr. Isabell is clinically and radiographically stable today, now >1 year s/p IMRT to tumor bed and residual mass.   Will  recommend continuing LMG 200mg  BID, Vimpat 200mg  BID, KLP 0.5mg  BID.  He is well compliant with AED regimen.  He will continue to work on anxiety and recent stressors through his counselor.   We ask that JORDIN VICENCIO return to clinic in 6 months following next brain MRI, or sooner as needed.  We appreciate the opportunity to participate in the care of JASRAJ LAPPE.    All questions were answered. The patient knows to call the clinic with any problems, questions or concerns. No barriers to learning were detected.    I have spent a total of 30 minutes of face-to-face and non-face-to-face time, excluding clinical staff time, preparing to see patient, ordering tests and/or medications, counseling the patient, and care coordination    [name], MD Medical Director of Neuro-Oncology [facility] [place] 04/07/20 9:05 AM

## 2020-04-08 ENCOUNTER — Telehealth: Payer: Self-pay | Admitting: Internal Medicine

## 2020-04-08 NOTE — Telephone Encounter (Signed)
Scheduled per 9/21 los. Unable to reach pt. Left voicemail with appt time and date.

## 2020-04-09 ENCOUNTER — Other Ambulatory Visit: Payer: Self-pay | Admitting: Family

## 2020-04-09 DIAGNOSIS — M7541 Impingement syndrome of right shoulder: Secondary | ICD-10-CM

## 2020-04-10 ENCOUNTER — Ambulatory Visit: Payer: Medicaid Other | Admitting: Family Medicine

## 2020-04-10 ENCOUNTER — Telehealth: Payer: Self-pay | Admitting: *Deleted

## 2020-04-10 NOTE — Telephone Encounter (Signed)
Joshua [name] left a message stating he forgot to tell Dr Mickeal Skinner that the medicine he is taking is making him very angry and anxious. He says he was so happy when he saw Dr Mickeal Skinner that he forgot. Wants to know what to do . Has appt with PCP on 9/29, does Dr Mickeal Skinner want him to get something from him?

## 2020-04-13 ENCOUNTER — Other Ambulatory Visit: Payer: Self-pay | Admitting: Radiation Therapy

## 2020-04-15 ENCOUNTER — Encounter: Payer: Self-pay | Admitting: Family Medicine

## 2020-04-15 ENCOUNTER — Other Ambulatory Visit: Payer: Self-pay

## 2020-04-15 ENCOUNTER — Ambulatory Visit: Payer: Medicaid Other | Attending: Family Medicine | Admitting: Family Medicine

## 2020-04-15 ENCOUNTER — Ambulatory Visit (HOSPITAL_BASED_OUTPATIENT_CLINIC_OR_DEPARTMENT_OTHER): Payer: Medicaid Other | Admitting: Licensed Clinical Social Worker

## 2020-04-15 VITALS — BP 155/76 | HR 84 | Temp 98.2°F | Resp 16 | Ht 69.0 in | Wt 197.6 lb

## 2020-04-15 DIAGNOSIS — M545 Low back pain, unspecified: Secondary | ICD-10-CM

## 2020-04-15 DIAGNOSIS — F419 Anxiety disorder, unspecified: Secondary | ICD-10-CM | POA: Insufficient documentation

## 2020-04-15 DIAGNOSIS — G40909 Epilepsy, unspecified, not intractable, without status epilepticus: Secondary | ICD-10-CM | POA: Diagnosis present

## 2020-04-15 DIAGNOSIS — M25511 Pain in right shoulder: Secondary | ICD-10-CM | POA: Diagnosis not present

## 2020-04-15 DIAGNOSIS — I10 Essential (primary) hypertension: Secondary | ICD-10-CM | POA: Diagnosis not present

## 2020-04-15 DIAGNOSIS — H109 Unspecified conjunctivitis: Secondary | ICD-10-CM | POA: Insufficient documentation

## 2020-04-15 DIAGNOSIS — K219 Gastro-esophageal reflux disease without esophagitis: Secondary | ICD-10-CM | POA: Diagnosis not present

## 2020-04-15 DIAGNOSIS — F418 Other specified anxiety disorders: Secondary | ICD-10-CM | POA: Diagnosis not present

## 2020-04-15 DIAGNOSIS — H1033 Unspecified acute conjunctivitis, bilateral: Secondary | ICD-10-CM | POA: Diagnosis not present

## 2020-04-15 DIAGNOSIS — Z79899 Other long term (current) drug therapy: Secondary | ICD-10-CM | POA: Diagnosis not present

## 2020-04-15 DIAGNOSIS — F4323 Adjustment disorder with mixed anxiety and depressed mood: Secondary | ICD-10-CM | POA: Diagnosis not present

## 2020-04-15 DIAGNOSIS — R4589 Other symptoms and signs involving emotional state: Secondary | ICD-10-CM

## 2020-04-15 DIAGNOSIS — Z7982 Long term (current) use of aspirin: Secondary | ICD-10-CM | POA: Diagnosis not present

## 2020-04-15 DIAGNOSIS — R7303 Prediabetes: Secondary | ICD-10-CM

## 2020-04-15 LAB — GLUCOSE, POCT (MANUAL RESULT ENTRY): POC Glucose: 98 mg/dL (ref 70–99)

## 2020-04-15 MED ORDER — OMEPRAZOLE 20 MG PO CPDR: DELAYED_RELEASE_CAPSULE | ORAL | 1 refills | Status: DC

## 2020-04-15 MED ORDER — LOSARTAN POTASSIUM 50 MG PO TABS: 50.0000 mg | ORAL_TABLET | Freq: Every day | ORAL | 3 refills | Status: DC

## 2020-04-15 MED ORDER — GENTAMICIN SULFATE 0.3 % OP SOLN: 1.0000 [drp] | Freq: Four times a day (QID) | OPHTHALMIC | 0 refills | Status: AC

## 2020-04-15 NOTE — Patient Instructions (Signed)
Hypertension, Adult Hypertension is another name for high blood pressure. High blood pressure forces your heart to work harder to pump blood. This can cause problems over time. There are two numbers in a blood pressure reading. There is a top number (systolic) over a bottom number (diastolic). It is best to have a blood pressure that is below 120/80. Healthy choices can help lower your blood pressure, or you may need medicine to help lower it. What are the causes? The cause of this condition is not known. Some conditions may be related to high blood pressure. What increases the risk?  Smoking.  Having type 2 diabetes mellitus, high cholesterol, or both.  Not getting enough exercise or physical activity.  Being overweight.  Having too much fat, sugar, calories, or salt (sodium) in your diet.  Drinking too much alcohol.  Having long-term (chronic) kidney disease.  Having a family history of high blood pressure.  Age. Risk increases with age.  Race. You may be at higher risk if you are African American.  Gender. Men are at higher risk than women before age 45. After age 65, women are at higher risk than men.  Having obstructive sleep apnea.  Stress. What are the signs or symptoms?  High blood pressure may not cause symptoms. Very high blood pressure (hypertensive crisis) may cause: ? Headache. ? Feelings of worry or nervousness (anxiety). ? Shortness of breath. ? Nosebleed. ? A feeling of being sick to your stomach (nausea). ? Throwing up (vomiting). ? Changes in how you see. ? Very bad chest pain. ? Seizures. How is this treated?  This condition is treated by making healthy lifestyle changes, such as: ? Eating healthy foods. ? Exercising more. ? Drinking less alcohol.  Your health care provider may prescribe medicine if lifestyle changes are not enough to get your blood pressure under control, and if: ? Your top number is above 130. ? Your bottom number is above  80.  Your personal target blood pressure may vary. Follow these instructions at home: Eating and drinking   If told, follow the DASH eating plan. To follow this plan: ? Fill one half of your plate at each meal with fruits and vegetables. ? Fill one fourth of your plate at each meal with whole grains. Whole grains include whole-wheat pasta, brown rice, and whole-grain bread. ? Eat or drink low-fat dairy products, such as skim milk or low-fat yogurt. ? Fill one fourth of your plate at each meal with low-fat (lean) proteins. Low-fat proteins include fish, chicken without skin, eggs, beans, and tofu. ? Avoid fatty meat, cured and processed meat, or chicken with skin. ? Avoid pre-made or processed food.  Eat less than 1,500 mg of salt each day.  Do not drink alcohol if: ? Your doctor tells you not to drink. ? You are pregnant, may be pregnant, or are planning to become pregnant.  If you drink alcohol: ? Limit how much you use to:  0-1 drink a day for women.  0-2 drinks a day for men. ? Be aware of how much alcohol is in your drink. In the [place] one drink equals one 12 oz bottle of beer (355 mL), one 5 oz glass of wine (148 mL), or one 1 oz glass of hard liquor (44 mL). Lifestyle   Work with your doctor to stay at a healthy weight or to lose weight. Ask your doctor what the best weight is for you.  Get at least 30 minutes of exercise most   days of the week. This may include walking, swimming, or biking.  Get at least 30 minutes of exercise that strengthens your muscles (resistance exercise) at least 3 days a week. This may include lifting weights or doing Pilates.  Do not use any products that contain nicotine or tobacco, such as cigarettes, e-cigarettes, and chewing tobacco. If you need help quitting, ask your doctor.  Check your blood pressure at home as told by your doctor.  Keep all follow-up visits as told by your doctor. This is important. Medicines  Take over-the-counter  and prescription medicines only as told by your doctor. Follow directions carefully.  Do not skip doses of blood pressure medicine. The medicine does not work as well if you skip doses. Skipping doses also puts you at risk for problems.  Ask your doctor about side effects or reactions to medicines that you should watch for. Contact a doctor if you:  Think you are having a reaction to the medicine you are taking.  Have headaches that keep coming back (recurring).  Feel dizzy.  Have swelling in your ankles.  Have trouble with your vision. Get help right away if you:  Get a very bad headache.  Start to feel mixed up (confused).  Feel weak or numb.  Feel faint.  Have very bad pain in your: ? Chest. ? Belly (abdomen).  Throw up more than once.  Have trouble breathing. Summary  Hypertension is another name for high blood pressure.  High blood pressure forces your heart to work harder to pump blood.  For most people, a normal blood pressure is less than 120/80.  Making healthy choices can help lower blood pressure. If your blood pressure does not get lower with healthy choices, you may need to take medicine. This information is not intended to replace advice given to you by your health care provider. Make sure you discuss any questions you have with your health care provider. Document Revised: 03/14/2018 Document Reviewed: 03/14/2018 Elsevier Patient Education  2020 [place].  Preventing Type 2 Diabetes Mellitus Type 2 diabetes (type 2 diabetes mellitus) is a long-term (chronic) disease that affects blood sugar (glucose) levels. Normally, a hormone called insulin allows glucose to enter cells in the body. The cells use glucose for energy. In type 2 diabetes, one or both of these problems may be present:  The body does not make enough insulin.  The body does not respond properly to insulin that it makes (insulin resistance). Insulin resistance or lack of insulin  causes excess glucose to build up in the blood instead of going into cells. As a result, high blood glucose (hyperglycemia) develops, which can cause many complications. Being overweight or obese and having an inactive (sedentary) lifestyle can increase your risk for diabetes. Type 2 diabetes can be delayed or prevented by making certain nutrition and lifestyle changes. What nutrition changes can be made?   Eat healthy meals and snacks regularly. Keep a healthy snack with you for when you get hungry between meals, such as fruit or a handful of nuts.  Eat lean meats and proteins that are low in saturated fats, such as chicken, fish, egg whites, and beans. Avoid processed meats.  Eat plenty of fruits and vegetables and plenty of grains that have not been processed (whole grains). It is recommended that you eat: ? 1?2 cups of fruit every day. ? 2?3 cups of vegetables every day. ? 6?8 oz of whole grains every day, such as oats, whole wheat, bulgur, brown rice,  quinoa, and millet.  Eat low-fat dairy products, such as milk, yogurt, and cheese.  Eat foods that contain healthy fats, such as nuts, avocado, olive oil, and canola oil.  Drink water throughout the day. Avoid drinks that contain added sugar, such as soda or sweet tea.  Follow instructions from your health care provider about specific eating or drinking restrictions.  Control how much food you eat at a time (portion size). ? Check food labels to find out the serving sizes of foods. ? Use a kitchen scale to weigh amounts of foods.  Saute or steam food instead of frying it. Cook with water or broth instead of oils or butter.  Limit your intake of: ? Salt (sodium). Have no more than 1 tsp (2,400 mg) of sodium a day. If you have heart disease or high blood pressure, have less than ? tsp (1,500 mg) of sodium a day. ? Saturated fat. This is fat that is solid at room temperature, such as butter or fat on meat. What lifestyle changes can  be made? Activity   Do moderate-intensity physical activity for at least 30 minutes on at least 5 days of the week, or as much as told by your health care provider.  Ask your health care provider what activities are safe for you. A mix of physical activities may be best, such as walking, swimming, cycling, and strength training.  Try to add physical activity into your day. For example: ? Park in spots that are farther away than usual, so that you walk more. For example, park in a far corner of the parking lot when you go to the office or the grocery store. ? Take a walk during your lunch break. ? Use stairs instead of elevators or escalators. Weight Loss  Lose weight as directed. Your health care provider can determine how much weight loss is best for you and can help you lose weight safely.  If you are overweight or obese, you may be instructed to lose at least 5?7 % of your body weight. Alcohol and Tobacco   Limit alcohol intake to no more than 1 drink a day for nonpregnant women and 2 drinks a day for men. One drink equals 12 oz of beer, 5 oz of wine, or 1 oz of hard liquor.  Do not use any tobacco products, such as cigarettes, chewing tobacco, and e-cigarettes. If you need help quitting, ask your health care provider. Work With [place] Provider  Have your blood glucose tested regularly, as told by your health care provider.  Discuss your risk factors and how you can reduce your risk for diabetes.  Get screening tests as told by your health care provider. You may have screening tests regularly, especially if you have certain risk factors for type 2 diabetes.  Make an appointment with a diet and nutrition specialist (registered dietitian). A registered dietitian can help you make a healthy eating plan and can help you understand portion sizes and food labels. Why are these changes important?  It is possible to prevent or delay type 2 diabetes and related health problems  by making lifestyle and nutrition changes.  It can be difficult to recognize signs of type 2 diabetes. The best way to avoid possible damage to your body is to take actions to prevent the disease before you develop symptoms. What can happen if changes are not made?  Your blood glucose levels may keep increasing. Having high blood glucose for a long time is dangerous.  Too much glucose in your blood can damage your blood vessels, heart, kidneys, nerves, and eyes.  You may develop prediabetes or type 2 diabetes. Type 2 diabetes can lead to many chronic health problems and complications, such as: ? Heart disease. ? Stroke. ? Blindness. ? Kidney disease. ? Depression. ? Poor circulation in the feet and legs, which could lead to surgical removal (amputation) in severe cases. Where to find support  Ask your health care provider to recommend a registered dietitian, diabetes educator, or weight loss program.  Look for local or online weight loss groups.  Join a gym, fitness club, or outdoor activity group, such as a walking club. Where to find more information To learn more about diabetes and diabetes prevention, visit:  American Diabetes Association (ADA): www.diabetes.[employer] of Diabetes and Digestive and Kidney Diseases: FindSpin.nl To learn more about healthy eating, visit:  The [place] Department of Agriculture Scientist, research (physical sciences)), Choose My Plate: http://wiley-williams.com/  Office of Disease Prevention and Health Promotion (ODPHP), Dietary Guidelines: SurferLive.at Summary  You can reduce your risk for type 2 diabetes by increasing your physical activity, eating healthy foods, and losing weight as directed.  Talk with your health care provider about your risk for type 2 diabetes. Ask about any blood tests or screening tests that you need to have. This information is not intended to replace advice given to you by your  health care provider. Make sure you discuss any questions you have with your health care provider. Document Revised: 10/26/2018 Document Reviewed: 08/25/2015 Elsevier Patient Education  [place].

## 2020-04-15 NOTE — Progress Notes (Signed)
Established Patient Office Visit  Subjective:  Patient ID: Joshua [name], male    DOB: 02-26-1961  Age: 59 y.o. MRN: [id number]  CC:  Chief Complaint  Patient presents with  . Follow-up    HPI Joshua [name], 59 year old male with seizure disorder and recent issues with recurrent falls who was seen in the office on 03/20/2020 to establish care status post emergency department visit for a fall due to possible mixup in seizure medications.  At his last visit, patient with elevated blood pressure in addition to issues with right shoulder pain and low back pain with radiation status post fall.  He also had a great deal of anxiety regarding the potential for a new seizure due to the mixup in medications which he felt was the fault of the pharmacy.  He reports no new seizures and no new falls since his last visit here.  He has been seen by orthopedics regarding his low back pain and shoulder pain and has follow-up with his neurologist.  He reports that his low back pain with radiation has resolved but he continues to have decreased range of motion and pain in the right shoulder.  He is awaiting an upcoming MRI.           He reports that he did have an episode of feeling dizzy and sweaty/clammy since last visit.  He was in a convenience store and started to feel clammy and lightheaded and he went back to his car.  He reports that he was then seen at urgent care and his blood sugar was 102.  He feels that 102 was low.  He reports a strong family history of diabetes in his mother, father and siblings.  He would like to have his glucose checked while he is here today.  He is aware of a prior diagnosis of prediabetes.           He has had an occasional few, dull headaches since his last visit.  He has no prior diagnosis of hypertension.  He had a meningioma in the past which was removed and he has had issues with headaches prior to the discovery of his meningioma.  After the visit, patient reports that he  forgot to mention that he is having issues with both eyes being itchy at times and having some stickiness of the eyelids in the mornings and that the lower part of his eyelids, lower inside of the eyes will be pink similar or prior conjunctivitis, pink eye.   Past Medical History:  Diagnosis Date  . Brain tumor ([place]) 02/20/2013   brain tumor removed in March 2014, Dr Donald Pore  . Diffuse idiopathic skeletal hyperostosis 06/05/2013  . Dizziness   . Enlarged prostate   . Headache(784.0)    scattered  . 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[place]) 03/07/2019  . Meningioma ([place])   . Meningioma, recurrent of brain ([place]) 01/31/2019  . Neuropathy 12/05/2019  . Seizures ([place])    11/27/19 last sz 1 wk ago  . Stroke [facility])     Past Surgical History:  Procedure Laterality Date  . APPLICATION OF CRANIAL NAVIGATION N/A 01/10/2019   Procedure: APPLICATION OF CRANIAL NAVIGATION;  Surgeon: Erline Levine, MD;  Location: [place];  Service: Neurosurgery;  Laterality: N/A;  . CATARACT EXTRACTION Right   . CRANIOTOMY Right 11/06/2012   Procedure: CRANIOTOMY TUMOR EXCISION;  Surgeon: Erline Levine, MD;  Location: [place] [facility];  Service: Neurosurgery;  Laterality: Right;  Right Parasagittal craniotomy for  meningioma with Stealth  . CRANIOTOMY Right 01/10/2019   Procedure: Right Parasagittal Craniotomy for Tumor;  Surgeon: Erline Levine, MD;  Location: [place];  Service: Neurosurgery;  Laterality: Right;  Right parasagittal craniotomy for tumor  . ESOPHAGOGASTRODUODENOSCOPY    . right knee arthroscopy      Family History  Problem Relation Age of Onset  . Hypertension Mother   . Dementia Mother   . Hypertension Father   . CAD Father   . CAD Brother     Social History   Socioeconomic History  . Marital status: Married    Spouse name: Not on file  . Number of children: 2  . Years of education: Not on file  . Highest education level: Not on file  Occupational History  . Not on file  Tobacco Use  .  Smoking status: Former Smoker    Types: Cigarettes    Quit date: 01/29/2019    Years since quitting: 1.2  . Smokeless tobacco: Never Used  . Tobacco comment: hasn't smoked in a week  Vaping Use  . Vaping Use: Never used  Substance and Sexual Activity  . Alcohol use: Not Currently    Alcohol/week: 8.0 standard drinks    Types: 6 Cans of beer, 2 Shots of liquor per week    Comment: occasionally, 11/27/19 none  . Drug use: No  . Sexual activity: Yes  Other Topics Concern  . Not on file  Social History Narrative   Lives with wife   Caffeine- soda occass   Social Determinants of Health   Financial Resource Strain:   . Difficulty of Paying Living Expenses: Not on file  Food Insecurity:   . Worried About Charity fundraiser in the Last Year: Not on file  . Ran Out of Food in the Last Year: Not on file  Transportation Needs:   . Lack of Transportation (Medical): Not on file  . Lack of Transportation (Non-Medical): Not on file  Physical Activity:   . Days of Exercise per Week: Not on file  . Minutes of Exercise per Session: Not on file  Stress:   . Feeling of Stress : Not on file  Social Connections:   . Frequency of Communication with Friends and Family: Not on file  . Frequency of Social Gatherings with Friends and Family: Not on file  . Attends Religious Services: Not on file  . Active Member of Clubs or Organizations: Not on file  . Attends Archivist Meetings: Not on file  . Marital Status: Not on file  Intimate Partner Violence:   . Fear of Current or Ex-Partner: Not on file  . Emotionally Abused: Not on file  . Physically Abused: Not on file  . Sexually Abused: Not on file    Outpatient Medications Prior to Visit  Medication Sig Dispense Refill  . aspirin EC 81 MG EC tablet Take 1 tablet (81 mg total) by mouth daily. 30 tablet 3  . atorvastatin (LIPITOR) 20 MG tablet Take 1 tablet (20 mg total) by mouth daily. 30 tablet 3  . clonazePAM (KLONOPIN) 0.5 MG  tablet TAKE 1 TABLET(0.5 MG) BY MOUTH TWICE DAILY (Patient taking differently: Take 0.5 mg by mouth 2 (two) times daily. ) 60 tablet 3  . cyclobenzaprine (FLEXERIL) 5 MG tablet Take 1 tablet (5 mg total) by mouth 2 (two) times daily as needed for muscle spasms. 10 tablet 0  . FIBER ADULT GUMMIES PO Take 2 tablets by mouth daily.     Marland Kitchen  gabapentin (NEURONTIN) 300 MG capsule Take 1 capsule (300 mg total) by mouth 2 (two) times daily. 60 capsule 3  . Lacosamide 100 MG TABS Take 2 tablets (200 mg total) by mouth in the morning and at bedtime. 60 tablet 3  . lamoTRIgine (LAMICTAL) 100 MG tablet TAKE 2 TABLETS (200 MG TOTAL) BY MOUTH 2 (TWO) TIMES DAILY. 120 tablet 0  . lidocaine (LIDODERM) 5 % Place 1 patch onto the skin daily. Remove & Discard patch within 12 hours or as directed by MD (Patient taking differently: Place 1 patch onto the skin daily as needed (pain). Remove & Discard patch within 12 hours or as directed by MD) 15 patch 0  . LORazepam (ATIVAN) 1 MG tablet Take 1 tablet (1 mg total) by mouth every 8 (eight) hours as needed for seizure (for breakthrough seizures). (Patient not taking: Reported on 04/07/2020) 4 tablet 0  . mirtazapine (REMERON) 15 MG tablet Take 15 mg by mouth at bedtime as needed (Sleep).     Marland Kitchen omeprazole (PRILOSEC) 20 MG capsule Take 1 capsule (20 mg total) by mouth daily. 30 capsule 1  . ondansetron (ZOFRAN-ODT) 4 MG disintegrating tablet Take 1 tablet (4 mg total) by mouth every 8 (eight) hours as needed for nausea or vomiting. 30 tablet 3  . vitamin C (ASCORBIC ACID) 500 MG tablet Take 500 mg by mouth daily.     No facility-administered medications prior to visit.    No Known Allergies  ROS Review of Systems  Constitutional: Positive for fatigue. Negative for chills and fever.  HENT: Negative for sore throat and trouble swallowing.   Eyes: Positive for redness and itching.  Respiratory: Negative for cough and shortness of breath.   Cardiovascular: Negative for chest  pain and leg swelling.  Gastrointestinal: Negative for abdominal pain, constipation, diarrhea and nausea.  Endocrine: Negative for polydipsia, polyphagia and polyuria.  Genitourinary: Negative for dysuria and frequency.  Musculoskeletal: Positive for arthralgias and gait problem.  Skin: Negative for rash and wound.  Neurological: Positive for headaches. Negative for dizziness.  Hematological: Negative for adenopathy. Does not bruise/bleed easily.  Psychiatric/Behavioral: Positive for sleep disturbance (wakes up at 3-4 am and sometimes unable to go back to sleep). The patient is nervous/anxious.       Objective:    Physical Exam Vitals and nursing note reviewed.  Constitutional:      Appearance: Normal appearance.  Eyes:     Extraocular Movements: Extraocular movements intact.     Conjunctiva/sclera: Conjunctivae normal.  Neck:     Vascular: No carotid bruit.  Cardiovascular:     Rate and Rhythm: Normal rate and regular rhythm.  Pulmonary:     Effort: Pulmonary effort is normal.     Breath sounds: Normal breath sounds.  Abdominal:     Palpations: Abdomen is soft.     Tenderness: There is no abdominal tenderness. There is no guarding or rebound.  Musculoskeletal:        General: Tenderness (no lower back tenderness to palp) present.     Cervical back: Normal range of motion and neck supple. No tenderness.     Right lower leg: No edema.     Left lower leg: No edema.     Comments: Continued pain with attempt to lift the right arm and decreased ROM of right arm at the shoulder  Lymphadenopathy:     Cervical: No cervical adenopathy.  Skin:    General: Skin is warm and dry.  Neurological:  Mental Status: He is alert and oriented to person, place, and time.     Gait: Gait abnormal.     Comments: Abnormal unsteady gait even with the use of a cane  Psychiatric:        Mood and Affect: Mood normal.        Behavior: Behavior normal.     BP (!) 155/76 (BP Location: Left Arm,  Patient Position: Sitting, Cuff Size: Normal)   Pulse 84   Temp 98.2 F (36.8 C) (Temporal)   Resp 16   Ht 5\' 9"  (1.753 m)   Wt 197 lb 9.6 oz (89.6 kg)   SpO2 96%   BMI 29.18 kg/m  Wt Readings from Last 3 Encounters:  04/07/20 200 lb 4.8 oz (90.9 kg)  04/01/20 197 lb (89.4 kg)  03/20/20 197 lb (89.4 kg)     Health Maintenance Due  Topic Date Due  . TETANUS/TDAP  Never done  . COLONOSCOPY  Never done     He is undecided about Tdap and will discuss with clinical pharmacist when he returns for blood pressure follow-up.   No results found for: TSH Lab Results  Component Value Date   WBC 8.0 03/16/2020   HGB 15.5 03/16/2020   HCT 44.2 03/16/2020   MCV 90.4 03/16/2020   PLT 299 03/16/2020   Lab Results  Component Value Date   NA 139 03/16/2020   K 3.9 03/16/2020   CO2 24 03/16/2020   GLUCOSE 109 (H) 03/16/2020   BUN 14 03/16/2020   CREATININE 0.84 03/16/2020   BILITOT 0.5 03/10/2020   ALKPHOS 73 03/10/2020   AST 22 03/10/2020   ALT 33 03/10/2020   PROT 7.3 03/10/2020   ALBUMIN 4.3 03/10/2020   CALCIUM 9.0 03/16/2020   ANIONGAP 8 03/16/2020   No results found for: CHOL No results found for: HDL No results found for: LDLCALC No results found for: TRIG No results found for: CHOLHDL Lab Results  Component Value Date   HGBA1C 6.2 (H) 12/03/2019      Assessment & Plan:  1. Seizure disorder [facility]) He has a history of seizure disorder and patient's neurology follow-up visit was reviewed.  He reports no seizure activity and no falls since his last visit.  He continues to take his medications including clonazepam and Lamictal.  He will have comprehensive metabolic panel in follow-up of medication use. - Comprehensive metabolic panel  2. Prediabetes Patient with prediabetes with hemoglobin A1c of 6.2 on 12/03/2019 on review of chart.  He reports that recently he had an episode of feeling slightly dizzy and sweaty/clammy and occurring in a store and then went to urgent  care where his blood sugar was 102 and he states that he felt that 102 was low for him.  He requests repeat random glucose at today's visit.  He will additionally have hemoglobin A1c and comprehensive metabolic panel as well as microalbumin creatinine ratio.  Information on preventing type 2 diabetes was provided as part of after visit summary.  He will be notified of the results and if further interventions such as medication such as Metformin are indicated to help with insulin resistance/prediabetes. - Hemoglobin A1c - Comprehensive metabolic panel - Microalbumin/Creatinine Ratio, Urine - Glucose (CBG)  3. Low back pain with radiation; 8.  Acute pain of right shoulder On review of chart, he has been able to have follow-up appointments with orthopedics regarding his low back pain and right shoulder pain.  He reports that his back pain has resolved  however he continues to have issues with pain and decreased range of motion of the right shoulder.  Per orthopedic notes, he is scheduled for MRI for further evaluation.  4. Encounter for long-term current use of medication Patient will have comprehensive metabolic panel in follow-up of long-term use of medications for the treatment of his seizure disorder and use of statin medication for control of hyperlipidemia. - Comprehensive metabolic panel  5. Essential hypertension Blood pressure has been elevated at patient's last visit and again today.  Blood pressure was also elevated at several of patient's specialty appointments on review of chart.  He agrees to start medication for control of blood pressure.  As he is also prediabetic, prescription will be provided for losartan 50 mg once daily.  Information given as part of AVS for patient to review regarding hypertension.  He will also have urine microalbumin creatinine ratio.  He is to return to clinic in the next 3 weeks to meet with the clinical pharmacist to have blood pressure checked and if blood  pressure still greater than 140/90 at that time then losartan dose can be increased. After visit, patient reports that he would like to try lowering his BP without medication but will return for BP recheck with the clinical pharmacist in a few weeks.  Educational material on hypertension provided as part of after visit summary. - Microalbumin/Creatinine Ratio, Urine - losartan (COZAAR) 50 MG tablet; Take 1 tablet (50 mg total) by mouth daily. To lower blood pressure  Dispense: 90 tablet; Refill: 3  6. Gastroesophageal reflux disease, unspecified whether esophagitis present He reports issues with sensation of backwash of bad tasting fluid into his mouth and throat when he is lying down on his back.  Prescription sent to patient's pharmacy for omeprazole 20 mg twice daily as he stated that he did not believe that he had previously been prescribed medication for control of acid reflux.  He apparently had been on omeprazole 20 mg daily once in the past but was not currently taking the medication on review of chart.  7. Anxiety about health Patient was able to meet with the medical social worker at today's visit to discuss his anxiety issues.  9. Conjunctivitis Discussed with patient that symptoms are likely related to allergies but since he feels that this is similar to prior bacterial conjunctivitis, RX sent to his pharmacy for garamycin but if continued itchiness of his eyes, he should try an otc allergy eyedrop, once daily claritin/loratadine and benadryl at bedtime to see if this helps stop the itching.    Follow-up: Return in about 14 weeks (around 07/22/2020) for chronic issues; 2-3 week f/u with Luke/CPP for BP recheck.   Antony Blackbird, MD

## 2020-04-15 NOTE — Progress Notes (Signed)
Wants Tdap today

## 2020-04-16 LAB — COMPREHENSIVE METABOLIC PANEL WITH GFR
ALT: 28 IU/L (ref 0–44)
AST: 21 IU/L (ref 0–40)
Albumin/Globulin Ratio: 2 (ref 1.2–2.2)
Albumin: 4.7 g/dL (ref 3.8–4.9)
Alkaline Phosphatase: 87 IU/L (ref 44–121)
BUN/Creatinine Ratio: 14 (ref 9–20)
BUN: 13 mg/dL (ref 6–24)
Bilirubin Total: <0.2 mg/dL (ref 0.0–1.2)
CO2: 26 mmol/L (ref 20–29)
Calcium: 9.9 mg/dL (ref 8.7–10.2)
Chloride: 104 mmol/L (ref 96–106)
Creatinine, Ser: 0.9 mg/dL (ref 0.76–1.27)
GFR calc Af Amer: 108 mL/min/1.73
GFR calc non Af Amer: 93 mL/min/1.73
Globulin, Total: 2.3 g/dL (ref 1.5–4.5)
Glucose: 89 mg/dL (ref 65–99)
Potassium: 4.7 mmol/L (ref 3.5–5.2)
Sodium: 144 mmol/L (ref 134–144)
Total Protein: 7 g/dL (ref 6.0–8.5)

## 2020-04-16 LAB — MICROALBUMIN / CREATININE URINE RATIO
Creatinine, Urine: 170.7 mg/dL
Microalb/Creat Ratio: 95 mg/g{creat} — ABNORMAL HIGH (ref 0–29)
Microalbumin, Urine: 163 ug/mL

## 2020-04-16 LAB — HEMOGLOBIN A1C
Est. average glucose Bld gHb Est-mCnc: 134 mg/dL
Hgb A1c MFr Bld: 6.3 % — ABNORMAL HIGH (ref 4.8–5.6)

## 2020-04-17 ENCOUNTER — Encounter: Payer: Self-pay | Admitting: Intensive Care

## 2020-04-17 ENCOUNTER — Other Ambulatory Visit: Payer: Self-pay

## 2020-04-17 ENCOUNTER — Emergency Department: Payer: Medicaid Other

## 2020-04-17 ENCOUNTER — Ambulatory Visit: Payer: Self-pay

## 2020-04-17 ENCOUNTER — Emergency Department
Admission: EM | Admit: 2020-04-17 | Discharge: 2020-04-17 | Disposition: A | Discharge status: Home / Self Care | Payer: Medicaid Other | Attending: Emergency Medicine | Admitting: Emergency Medicine

## 2020-04-17 DIAGNOSIS — I10 Essential (primary) hypertension: Secondary | ICD-10-CM | POA: Insufficient documentation

## 2020-04-17 DIAGNOSIS — R0789 Other chest pain: Secondary | ICD-10-CM | POA: Diagnosis not present

## 2020-04-17 DIAGNOSIS — Z85841 Personal history of malignant neoplasm of brain: Secondary | ICD-10-CM | POA: Insufficient documentation

## 2020-04-17 DIAGNOSIS — Z87891 Personal history of nicotine dependence: Secondary | ICD-10-CM | POA: Diagnosis not present

## 2020-04-17 DIAGNOSIS — Z79899 Other long term (current) drug therapy: Secondary | ICD-10-CM | POA: Diagnosis not present

## 2020-04-17 DIAGNOSIS — Z7982 Long term (current) use of aspirin: Secondary | ICD-10-CM | POA: Insufficient documentation

## 2020-04-17 DIAGNOSIS — R079 Chest pain, unspecified: Secondary | ICD-10-CM | POA: Diagnosis not present

## 2020-04-17 HISTORY — DX: Essential (primary) hypertension: I10

## 2020-04-17 LAB — CBC
HCT: 45.4 % (ref 39.0–52.0)
Hemoglobin: 15.5 g/dL (ref 13.0–17.0)
MCH: 31.3 pg (ref 26.0–34.0)
MCHC: 34.1 g/dL (ref 30.0–36.0)
MCV: 91.7 fL (ref 80.0–100.0)
Platelets: 241 10*3/uL (ref 150–400)
RBC: 4.95 MIL/uL (ref 4.22–5.81)
RDW: 12.9 % (ref 11.5–15.5)
WBC: 7.5 10*3/uL (ref 4.0–10.5)
nRBC: 0 % (ref 0.0–0.2)

## 2020-04-17 LAB — BASIC METABOLIC PANEL
Anion gap: 9 (ref 5–15)
BUN: 14 mg/dL (ref 6–20)
CO2: 26 mmol/L (ref 22–32)
Calcium: 9.4 mg/dL (ref 8.9–10.3)
Chloride: 104 mmol/L (ref 98–111)
Creatinine, Ser: 1 mg/dL (ref 0.61–1.24)
GFR calc Af Amer: >60 mL/min (ref >60)
GFR calc non Af Amer: >60 mL/min (ref >60)
Glucose, Bld: 105 mg/dL — ABNORMAL HIGH (ref 70–99)
Potassium: 4.5 mmol/L (ref 3.5–5.1)
Sodium: 139 mmol/L (ref 135–145)

## 2020-04-17 LAB — TROPONIN I (HIGH SENSITIVITY)
Troponin I (High Sensitivity): 5 ng/L (ref <18)
Troponin I (High Sensitivity): 14 ng/L (ref <18)

## 2020-04-17 NOTE — Telephone Encounter (Signed)
Pt. Reports he just started Losartan this morning. 30 minutes after taking it he felt "jittery, like I was going to fall out." States he feels fine now. At the pharmacy now to check his BP. Asking if this is a side effect of the medication. Please advise.  Answer Assessment - Initial Assessment Questions 1. NAME of MEDICATION: "What medicine are you calling about?"     Losartan 2. QUESTION: "What is your question?" (e.g., medication refill, side effect)     Side affect 3. PRESCRIBING HCP: "Who prescribed it?" Reason: if prescribed by specialist, call should be referred to that group.     Dr. Chapman Fitch 4. SYMPTOMS: "Do you have any symptoms?"     Felt "like I was going to fall out and panicky" 5. SEVERITY: If symptoms are present, ask "Are they mild, moderate or severe?"     Moderate, but feels fine now. 6. PREGNANCY:  "Is there any chance that you are pregnant?" "When was your last menstrual period?"     n/a  Protocols used: MEDICATION QUESTION CALL-A-AH

## 2020-04-17 NOTE — Telephone Encounter (Signed)
He can take just half of his tablet and check his blood pressure.

## 2020-04-17 NOTE — ED Provider Notes (Signed)
[facility] Emergency Department Provider Note  Time seen: 6:39 PM  I have reviewed the triage vital signs and the nursing notes.   HISTORY  Chief Complaint Chest Pain and Hypertension  HPI Joshua [name] is a 59 y.o. male with a past medical history of meningioma status post removal, seizure disorder, hypertension, presents to the emergency department for high blood pressure.  According to the patient he has been experiencing high blood pressure and feeling fatigued.  Patient saw his PCP recently started him on a new blood pressure medication 2 days ago.  Patient states he took his blood pressure at home earlier today and it was 200/100, he became concerned.  States he had to go to the pharmacy to pick up the medication while at the pharmacy he had his blood pressure checked by the pharmacist and it was 194/69.  The pharmacist recommended he come to the emergency department for evaluation.  During review of systems questioning the patient does state he has experienced some chest tightness last night but denies any chest tightness or pain today, none currently.  Denies any shortness of breath fever cough.  Overall the patient appears well, no distress.   Past Medical History:  Diagnosis Date  . Brain tumor ([place]) 02/20/2013   brain tumor removed in March 2014, Dr Donald Pore  . Diffuse idiopathic skeletal hyperostosis 06/05/2013  . Dizziness   . Enlarged prostate   . Headache(784.0)    scattered  . Hypertension   . 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[place]) 03/07/2019  . Meningioma ([place])   . Meningioma, recurrent of brain ([place]) 01/31/2019  . Neuropathy 12/05/2019  . Seizures ([place])    11/27/19 last sz 1 wk ago  . Stroke [facility])     Patient Active Problem List   Diagnosis Date Noted  . Neuropathy 12/05/2019  . 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 Seizures ([place]) 01/31/2019  . Meningioma, recurrent of brain ([place]) 01/31/2019  . Meningioma  ([place]) 01/03/2019  . Brain tumor ([place]) 12/27/2018  . Diffuse idiopathic skeletal hyperostosis 06/05/2013    Past Surgical History:  Procedure Laterality Date  . APPLICATION OF CRANIAL NAVIGATION N/A 01/10/2019   Procedure: APPLICATION OF CRANIAL NAVIGATION;  Surgeon: Erline Levine, MD;  Location: [place];  Service: Neurosurgery;  Laterality: N/A;  . CATARACT EXTRACTION Right   . CRANIOTOMY Right 11/06/2012   Procedure: CRANIOTOMY TUMOR EXCISION;  Surgeon: Erline Levine, MD;  Location: [place] NEURO ORS;  Service: Neurosurgery;  Laterality: Right;  Right Parasagittal craniotomy for meningioma with Stealth  . CRANIOTOMY Right 01/10/2019   Procedure: Right Parasagittal Craniotomy for Tumor;  Surgeon: Erline Levine, MD;  Location: [place];  Service: Neurosurgery;  Laterality: Right;  Right parasagittal craniotomy for tumor  . ESOPHAGOGASTRODUODENOSCOPY    . right knee arthroscopy      Prior to Admission medications   Medication Sig Start Date End Date Taking? Authorizing Provider  aspirin EC 81 MG EC tablet Take 1 tablet (81 mg total) by mouth daily. 01/14/19   Erline Levine, MD  atorvastatin (LIPITOR) 20 MG tablet Take 1 tablet (20 mg total) by mouth daily. 11/26/19   Charlott Rakes, MD  clonazePAM (KLONOPIN) 0.5 MG tablet TAKE 1 TABLET(0.5 MG) BY MOUTH TWICE DAILY Patient taking differently: Take 0.5 mg by mouth 2 (two) times daily.  12/12/19   Vaslow, Acey Lav, MD  cyclobenzaprine (FLEXERIL) 5 MG tablet Take 1 tablet (5 mg total) by mouth 2 (two) times daily as  needed for muscle spasms. 02/23/20   Recardo Evangelist, PA-C  FIBER ADULT GUMMIES PO Take 2 tablets by mouth daily.  Patient not taking: Reported on 04/15/2020    [provider]  gabapentin (NEURONTIN) 300 MG capsule Take 1 capsule (300 mg total) by mouth 2 (two) times daily. 12/03/19   Vaslow, Acey Lav, MD  gentamicin (GARAMYCIN) 0.3 % ophthalmic solution Place 1 drop into both eyes 4 (four) times daily for 7 days. 04/15/20 04/22/20  Fulp,  Ander Gaster, MD  Lacosamide 100 MG TABS Take 2 tablets (200 mg total) by mouth in the morning and at bedtime. 02/04/20   [name], MD  lamoTRIgine (LAMICTAL) 100 MG tablet TAKE 2 TABLETS (200 MG TOTAL) BY MOUTH 2 (TWO) TIMES DAILY. 04/06/20   Vaslow, Acey Lav, MD  lidocaine (LIDODERM) 5 % Place 1 patch onto the skin daily. Remove & Discard patch within 12 hours or as directed by MD Patient not taking: Reported on 04/15/2020 02/23/20   Recardo Evangelist, PA-C  LORazepam (ATIVAN) 1 MG tablet Take 1 tablet (1 mg total) by mouth every 8 (eight) hours as needed for seizure (for breakthrough seizures). Patient not taking: Reported on 04/07/2020 12/26/19   [name], MD  losartan (COZAAR) 50 MG tablet Take 1 tablet (50 mg total) by mouth daily. To lower blood pressure 04/15/20   Fulp, Cammie, MD  mirtazapine (REMERON) 15 MG tablet Take 15 mg by mouth at bedtime as needed (Sleep).     [provider]  omeprazole (PRILOSEC) 20 MG capsule One pill twice daily- morning and before bedtime to reduce stomach acid 04/15/20   Fulp, Cammie, MD  ondansetron (ZOFRAN-ODT) 4 MG disintegrating tablet Take 1 tablet (4 mg total) by mouth every 8 (eight) hours as needed for nausea or vomiting. 02/04/20   Vaslow, Acey Lav, MD  vitamin C (ASCORBIC ACID) 500 MG tablet Take 500 mg by mouth daily. Patient not taking: Reported on 04/15/2020    [provider]    No Known Allergies  Family History  Problem Relation Age of Onset  . Hypertension Mother   . Dementia Mother   . Diabetes Mother   . Hypertension Father   . CAD Father   . Diabetes Father   . CAD Brother     Social History Social History   Tobacco Use  . Smoking status: Former Smoker    Types: Cigarettes    Quit date: 01/29/2019    Years since quitting: 1.2  . Smokeless tobacco: Never Used  . Tobacco comment: hasn't smoked in a week  Vaping Use  . Vaping Use: Never used  Substance Use Topics  . Alcohol use: Yes  . Drug use: No     Review of Systems Constitutional: Negative for fever. Cardiovascular: Mild chest tightness yesterday, none today. Respiratory: Negative for shortness of breath. Gastrointestinal: Negative for abdominal pain Musculoskeletal: Negative for musculoskeletal complaints Neurological: Negative for headache All other ROS negative  ____________________________________________   PHYSICAL EXAM:  VITAL SIGNS: ED Triage Vitals  Enc Vitals Group     BP 04/17/20 1310 (!) 149/80     Pulse Rate 04/17/20 1310 72     Resp 04/17/20 1310 16     Temp 04/17/20 1306 98.4 F (36.9 C)     Temp Source 04/17/20 1306 Oral     SpO2 04/17/20 1310 97 %     Weight 04/17/20 1307 197 lb 9.6 oz (89.6 kg)     Height 04/17/20 1307 5'  9" (1.753 m)     Head Circumference --      Peak Flow --      Pain Score 04/17/20 1307 9     Pain Loc --      Pain Edu? --      Excl. in [place]? --     Constitutional: Alert and oriented. Well appearing and in no distress. Eyes: Normal exam ENT      Head: Normocephalic and atraumatic.      Mouth/Throat: Mucous membranes are moist. Cardiovascular: Normal rate, regular rhythm. No murmur Respiratory: Normal respiratory effort without tachypnea nor retractions. Breath sounds are clear Gastrointestinal: Soft and nontender. No distention.   Musculoskeletal: Nontender with normal range of motion in all extremities.  Neurologic:  Normal speech and language. No gross focal neurologic deficits  Skin:  Skin is warm, dry and intact.  Psychiatric: Mood and affect are normal.   ____________________________________________    EKG  EKG viewed and interpreted by myself shows a normal sinus rhythm at 63 bpm with a narrow QRS, normal axis, normal intervals, nonspecific ST changes.  ____________________________________________    RADIOLOGY  X-rays negative  ____________________________________________   INITIAL IMPRESSION / ASSESSMENT AND PLAN / ED COURSE  Pertinent labs &  imaging results that were available during my care of the patient were reviewed by me and considered in my medical decision making (see chart for details).   Patient presents to the emergency department for high blood pressure, review of systems states chest tightness yesterday.  Overall the patient appears well, initially hypertensive upon arrival, now down to 132/80.  Patient denies any symptoms at this time, states he would not have, if his pharmacist did not tell him to.  Patient does state some chest tightness last night however troponin x2 remain negative.  EKG shows no concerning findings.  As the patient's blood pressure has come down on its own and I do believe the patient is safe for discharge home with PCP follow-up to discuss his blood pressure medication regimen I also discussed return precautions for any further chest pain or shortness of breath.  Patient agreeable to plan of care.  Joshua [name] was evaluated in Emergency Department on 04/17/2020 for the symptoms described in the history of present illness. He was evaluated in the context of the global COVID-19 pandemic, which necessitated consideration that the patient might be at risk for infection with the SARS-CoV-2 virus that causes COVID-19. Institutional protocols and algorithms that pertain to the evaluation of patients at risk for COVID-19 are in a state of rapid change based on information released by regulatory bodies including the CDC and federal and state organizations. These policies and algorithms were followed during the patient's care in the ED.  ____________________________________________   FINAL CLINICAL IMPRESSION(S) / ED DIAGNOSES  Chest pain Hypertension   Harvest Dark, MD 04/17/20 4503

## 2020-04-17 NOTE — Telephone Encounter (Signed)
Patient was called and given PCP response.

## 2020-04-17 NOTE — ED Triage Notes (Signed)
Patient presents with CP, HTN, and c/o disorientation. Reports blood pressure before arriving 194/69. Recently started on new blood pressure medication. A&O x4 during triage

## 2020-04-20 ENCOUNTER — Other Ambulatory Visit: Payer: Self-pay | Admitting: Family Medicine

## 2020-04-20 ENCOUNTER — Telehealth: Payer: Self-pay | Admitting: Family Medicine

## 2020-04-20 DIAGNOSIS — M545 Low back pain, unspecified: Secondary | ICD-10-CM

## 2020-04-20 DIAGNOSIS — M542 Cervicalgia: Secondary | ICD-10-CM

## 2020-04-20 DIAGNOSIS — M25511 Pain in right shoulder: Secondary | ICD-10-CM

## 2020-04-20 NOTE — Telephone Encounter (Signed)
Please notify patient that referral has been placed

## 2020-04-20 NOTE — Telephone Encounter (Signed)
Copied from [place] [id number] [id number]. Topic: General - Inquiry >> Apr 20, 2020 10:37 AM Alease Frame wrote: Reason for CRM:Pt is requesting physical therapy and would like a call back from the office . Please advise

## 2020-04-20 NOTE — Telephone Encounter (Signed)
Patient is calling back to request those orders for the PT facility on [address] if possible.

## 2020-04-20 NOTE — Telephone Encounter (Signed)
ATC pt, no answer, left discreet message informing that requested referral was placed. Secure message also sent via MyChart

## 2020-04-20 NOTE — Progress Notes (Signed)
Patient ID: Joshua [name], male   DOB: 1961/07/09, 59 y.o.   MRN: [id number]   Patient is requesting PT in [place] on [address]

## 2020-04-22 ENCOUNTER — Emergency Department (HOSPITAL_COMMUNITY): Payer: Medicaid Other

## 2020-04-22 ENCOUNTER — Other Ambulatory Visit: Payer: Self-pay | Admitting: Internal Medicine

## 2020-04-22 ENCOUNTER — Emergency Department (HOSPITAL_COMMUNITY)
Admission: EM | Admit: 2020-04-22 | Discharge: 2020-04-22 | Disposition: A | Discharge status: Home / Self Care | Payer: Medicaid Other | Attending: Emergency Medicine | Admitting: Emergency Medicine

## 2020-04-22 ENCOUNTER — Other Ambulatory Visit: Payer: Self-pay

## 2020-04-22 DIAGNOSIS — Z79899 Other long term (current) drug therapy: Secondary | ICD-10-CM | POA: Diagnosis not present

## 2020-04-22 DIAGNOSIS — I10 Essential (primary) hypertension: Secondary | ICD-10-CM | POA: Insufficient documentation

## 2020-04-22 DIAGNOSIS — S20211A Contusion of right front wall of thorax, initial encounter: Secondary | ICD-10-CM

## 2020-04-22 DIAGNOSIS — Z87891 Personal history of nicotine dependence: Secondary | ICD-10-CM | POA: Insufficient documentation

## 2020-04-22 DIAGNOSIS — Z7982 Long term (current) use of aspirin: Secondary | ICD-10-CM | POA: Insufficient documentation

## 2020-04-22 DIAGNOSIS — S299XXA Unspecified injury of thorax, initial encounter: Secondary | ICD-10-CM | POA: Diagnosis not present

## 2020-04-22 DIAGNOSIS — W208XXA Other cause of strike by thrown, projected or falling object, initial encounter: Secondary | ICD-10-CM | POA: Diagnosis not present

## 2020-04-22 DIAGNOSIS — Z85841 Personal history of malignant neoplasm of brain: Secondary | ICD-10-CM | POA: Insufficient documentation

## 2020-04-22 LAB — URINALYSIS, ROUTINE W REFLEX MICROSCOPIC
Bacteria, UA: NONE SEEN
Bilirubin Urine: NEGATIVE
Glucose, UA: NEGATIVE mg/dL
Hgb urine dipstick: NEGATIVE
Ketones, ur: NEGATIVE mg/dL
Leukocytes,Ua: NEGATIVE
Nitrite: NEGATIVE
Protein, ur: 30 mg/dL — AB
Specific Gravity, Urine: 1.023 (ref 1.005–1.030)
pH: 5 (ref 5.0–8.0)

## 2020-04-22 MED ORDER — METHOCARBAMOL 500 MG PO TABS: 500.0000 mg | ORAL_TABLET | Freq: Three times a day (TID) | ORAL | 0 refills | Status: DC | PRN

## 2020-04-22 NOTE — ED Triage Notes (Signed)
Pt states he was trying to fix something in his trunk, had something propped open to hold his trunk open and it came loose, trunk falling and hitting his lower back and right ribs.

## 2020-04-22 NOTE — ED Provider Notes (Signed)
[facility] DEPT Provider Note   CSN: [id number] Arrival date & time: 04/22/20  1414     History Chief Complaint  Patient presents with  . Rib Injury    Joshua [name] is a 59 y.o. male.  HPI Patient presents after right flank/chest injury.  States yesterday he was hit on his flank.  Or lower chest by a trunk lid that fell on him.  States there is pain.  Worse with movement.  May be mildly short of breath.  Will occasionally feel a pop.  No blood in the urine.  No real abdominal pain.  Does have a history of chronic back pain.    Past Medical History:  Diagnosis Date  . Brain tumor ([place]) 02/20/2013   brain tumor removed in March 2014, Dr Donald Pore  . Diffuse idiopathic skeletal hyperostosis 06/05/2013  . Dizziness   . Enlarged prostate   . Headache(784.0)    scattered  . Hypertension   . 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[place]) 03/07/2019  . Meningioma ([place])   . Meningioma, recurrent of brain ([place]) 01/31/2019  . Neuropathy 12/05/2019  . Seizures ([place])    11/27/19 last sz 1 wk ago  . Stroke [facility])     Patient Active Problem List   Diagnosis Date Noted  . Neuropathy 12/05/2019  . 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[place]) 03/07/2019  . Seizures ([place]) 01/31/2019  . Meningioma, recurrent of brain ([place]) 01/31/2019  . Meningioma ([place]) 01/03/2019  . Brain tumor ([place]) 12/27/2018  . Diffuse idiopathic skeletal hyperostosis 06/05/2013    Past Surgical History:  Procedure Laterality Date  . APPLICATION OF CRANIAL NAVIGATION N/A 01/10/2019   Procedure: APPLICATION OF CRANIAL NAVIGATION;  Surgeon: Erline Levine, MD;  Location: [facility];  Service: Neurosurgery;  Laterality: N/A;  . CATARACT EXTRACTION Right   . CRANIOTOMY Right 11/06/2012   Procedure: CRANIOTOMY TUMOR EXCISION;  Surgeon: Erline Levine, MD;  Location: [place] NEURO ORS;  Service: Neurosurgery;  Laterality: Right;  Right Parasagittal craniotomy for meningioma with Stealth  .  CRANIOTOMY Right 01/10/2019   Procedure: Right Parasagittal Craniotomy for Tumor;  Surgeon: Erline Levine, MD;  Location: [facility];  Service: Neurosurgery;  Laterality: Right;  Right parasagittal craniotomy for tumor  . ESOPHAGOGASTRODUODENOSCOPY    . right knee arthroscopy         Family History  Problem Relation Age of Onset  . Hypertension Mother   . Dementia Mother   . Diabetes Mother   . Hypertension Father   . CAD Father   . Diabetes Father   . CAD Brother     Social History   Tobacco Use  . Smoking status: Former Smoker    Types: Cigarettes    Quit date: 01/29/2019    Years since quitting: 1.2  . Smokeless tobacco: Never Used  . Tobacco comment: hasn't smoked in a week  Vaping Use  . Vaping Use: Never used  Substance Use Topics  . Alcohol use: Yes  . Drug use: No    Home Medications Prior to Admission medications   Medication Sig Start Date End Date Taking? Authorizing Provider  aspirin EC 81 MG EC tablet Take 1 tablet (81 mg total) by mouth daily. 01/14/19   Erline Levine, MD  atorvastatin (LIPITOR) 20 MG tablet Take 1 tablet (20 mg total) by mouth daily. 11/26/19   Charlott Rakes, MD  clonazePAM (KLONOPIN) 0.5 MG tablet TAKE 1 TABLET(0.5 MG) BY MOUTH TWICE DAILY Patient taking differently: Take 0.5 mg  by mouth 2 (two) times daily.  12/12/19   [name], MD  FIBER ADULT GUMMIES PO Take 2 tablets by mouth daily.  Patient not taking: Reported on 04/15/2020    [provider]  gabapentin (NEURONTIN) 300 MG capsule Take 1 capsule (300 mg total) by mouth 2 (two) times daily. 12/03/19   Vaslow, Acey Lav, MD  gentamicin (GARAMYCIN) 0.3 % ophthalmic solution Place 1 drop into both eyes 4 (four) times daily for 7 days. 04/15/20 04/22/20  Fulp, Ander Gaster, MD  Lacosamide 100 MG TABS Take 2 tablets (200 mg total) by mouth in the morning and at bedtime. 02/04/20   [name], MD  lamoTRIgine (LAMICTAL) 100 MG tablet TAKE 2 TABLETS (200 MG TOTAL) BY MOUTH 2 (TWO)  TIMES DAILY. 04/06/20   Vaslow, Acey Lav, MD  lidocaine (LIDODERM) 5 % Place 1 patch onto the skin daily. Remove & Discard patch within 12 hours or as directed by MD Patient not taking: Reported on 04/15/2020 02/23/20   Recardo Evangelist, PA-C  LORazepam (ATIVAN) 1 MG tablet Take 1 tablet (1 mg total) by mouth every 8 (eight) hours as needed for seizure (for breakthrough seizures). Patient not taking: Reported on 04/07/2020 12/26/19   [name], MD  losartan (COZAAR) 50 MG tablet Take 1 tablet (50 mg total) by mouth daily. To lower blood pressure 04/15/20   Fulp, Cammie, MD  methocarbamol (ROBAXIN) 500 MG tablet Take 1 tablet (500 mg total) by mouth every 8 (eight) hours as needed for muscle spasms. 04/22/20   Davonna Belling, MD  mirtazapine (REMERON) 15 MG tablet Take 15 mg by mouth at bedtime as needed (Sleep).     [provider]  omeprazole (PRILOSEC) 20 MG capsule One pill twice daily- morning and before bedtime to reduce stomach acid 04/15/20   Fulp, Cammie, MD  ondansetron (ZOFRAN-ODT) 4 MG disintegrating tablet Take 1 tablet (4 mg total) by mouth every 8 (eight) hours as needed for nausea or vomiting. 02/04/20   Vaslow, Acey Lav, MD  vitamin C (ASCORBIC ACID) 500 MG tablet Take 500 mg by mouth daily. Patient not taking: Reported on 04/15/2020    [provider]    Allergies    Patient has no known allergies.  Review of Systems   Review of Systems  Constitutional: Negative for appetite change.  HENT: Negative for congestion.   Respiratory: Negative for shortness of breath.   Cardiovascular: Positive for chest pain.  Gastrointestinal: Negative for abdominal pain.  Genitourinary: Positive for flank pain.  Musculoskeletal: Positive for back pain.  Skin: Negative for rash.  Neurological: Negative for weakness.    Physical Exam Updated Vital Signs BP (!) 143/95 (BP Location: Left Arm)   Pulse 93   Temp 99.3 F (37.4 C) (Oral)   Resp 16   SpO2 96%    Physical Exam Vitals and nursing note reviewed.  HENT:     Head: Normocephalic.  Eyes:     Pupils: Pupils are equal, round, and reactive to light.  Cardiovascular:     Rate and Rhythm: Regular rhythm.  Pulmonary:     Breath sounds: No wheezing or rhonchi.  Abdominal:     Tenderness: There is no abdominal tenderness.  Musculoskeletal:        General: Tenderness present.     Cervical back: Neck supple.     Comments: Right flank/lower rib tenderness.  No crepitance deformity.  There is some ecchymosis lower on the right flank pain  Skin:  Capillary Refill: Capillary refill takes less than 2 seconds.  Neurological:     Mental Status: He is alert and oriented to person, place, and time.     ED Results / Procedures / Treatments   Labs (all labs ordered are listed, but only abnormal results are displayed) Labs Reviewed  URINALYSIS, ROUTINE W REFLEX MICROSCOPIC - Abnormal; Notable for the following components:      Result Value   Protein, ur 30 (*)    All other components within normal limits    EKG None  Radiology DG Ribs Unilateral W/Chest Right  Result Date: 04/22/2020 CLINICAL DATA:  Blunt trauma to the right chest wall, initial encounter EXAM: RIGHT RIBS AND CHEST - 3+ VIEW COMPARISON:  02/16/2020 FINDINGS: Cardiac shadow is within normal limits. The lungs are well aerated bilaterally. No focal infiltrate or sizable effusion is seen. No pneumothorax is noted. No acute rib deformity is noted. IMPRESSION: No evidence of acute rib fracture.  No pneumothorax is noted. Electronically Signed   By: Inez Catalina M.D.   On: 04/22/2020 16:07    Procedures Procedures (including critical care time)  Medications Ordered in ED Medications - No data to display  ED Course  I have reviewed the triage vital signs and the nursing notes.  Pertinent labs & imaging results that were available during my care of the patient were reviewed by me and considered in my medical decision making  (see chart for details).    MDM Rules/Calculators/A&P                          Patient with right flank pain after being hit by a falling trunk lid.  During.  Benign abdominal exam.  Urine does not show infection.  I feels the patient stable for discharge.  Will treat with muscle relaxers.  Drug database reviewed.  Discharge home with outpatient follow-up as needed. Final Clinical Impression(s) / ED Diagnoses Final diagnoses:  Contusion of right chest wall, initial encounter    Rx / DC Orders ED Discharge Orders         Ordered    methocarbamol (ROBAXIN) 500 MG tablet  Every 8 hours PRN        04/22/20 1710           Davonna Belling, MD 04/22/20 [id number]

## 2020-04-23 ENCOUNTER — Other Ambulatory Visit: Payer: Self-pay

## 2020-04-23 ENCOUNTER — Telehealth: Payer: Self-pay | Admitting: *Deleted

## 2020-04-23 ENCOUNTER — Encounter: Payer: Self-pay | Admitting: Emergency Medicine

## 2020-04-23 DIAGNOSIS — Z8673 Personal history of transient ischemic attack (TIA), and cerebral infarction without residual deficits: Secondary | ICD-10-CM | POA: Insufficient documentation

## 2020-04-23 DIAGNOSIS — Z7982 Long term (current) use of aspirin: Secondary | ICD-10-CM | POA: Insufficient documentation

## 2020-04-23 DIAGNOSIS — R0789 Other chest pain: Secondary | ICD-10-CM | POA: Diagnosis not present

## 2020-04-23 DIAGNOSIS — F419 Anxiety disorder, unspecified: Secondary | ICD-10-CM | POA: Diagnosis not present

## 2020-04-23 DIAGNOSIS — I10 Essential (primary) hypertension: Secondary | ICD-10-CM | POA: Insufficient documentation

## 2020-04-23 DIAGNOSIS — R451 Restlessness and agitation: Secondary | ICD-10-CM | POA: Insufficient documentation

## 2020-04-23 DIAGNOSIS — Z87891 Personal history of nicotine dependence: Secondary | ICD-10-CM | POA: Diagnosis not present

## 2020-04-23 DIAGNOSIS — Z79899 Other long term (current) drug therapy: Secondary | ICD-10-CM | POA: Insufficient documentation

## 2020-04-23 DIAGNOSIS — R079 Chest pain, unspecified: Secondary | ICD-10-CM | POA: Diagnosis not present

## 2020-04-23 DIAGNOSIS — Z85841 Personal history of malignant neoplasm of brain: Secondary | ICD-10-CM | POA: Diagnosis not present

## 2020-04-23 LAB — CBC
HCT: 44.3 % (ref 39.0–52.0)
Hemoglobin: 15.1 g/dL (ref 13.0–17.0)
MCH: 31.1 pg (ref 26.0–34.0)
MCHC: 34.1 g/dL (ref 30.0–36.0)
MCV: 91.2 fL (ref 80.0–100.0)
Platelets: 254 10*3/uL (ref 150–400)
RBC: 4.86 MIL/uL (ref 4.22–5.81)
RDW: 12.5 % (ref 11.5–15.5)
WBC: 7.4 10*3/uL (ref 4.0–10.5)
nRBC: 0 % (ref 0.0–0.2)

## 2020-04-23 LAB — BASIC METABOLIC PANEL
Anion gap: 8 (ref 5–15)
BUN: 11 mg/dL (ref 6–20)
CO2: 28 mmol/L (ref 22–32)
Calcium: 9 mg/dL (ref 8.9–10.3)
Chloride: 105 mmol/L (ref 98–111)
Creatinine, Ser: 1.08 mg/dL (ref 0.61–1.24)
GFR calc non Af Amer: >60 mL/min (ref >60)
Glucose, Bld: 115 mg/dL — ABNORMAL HIGH (ref 70–99)
Potassium: 3.8 mmol/L (ref 3.5–5.1)
Sodium: 141 mmol/L (ref 135–145)

## 2020-04-23 NOTE — Telephone Encounter (Signed)
Copied from [place] [id number]. Topic: Quick Communication - See Telephone Encounter >> Apr 23, 2020  1:46 PM Blase Mess A wrote: CRM for notification. See Telephone encounter for: 04/23/20.  Patient is returning a missed call regarding his blood pressure. Please advise [phone]

## 2020-04-23 NOTE — ED Triage Notes (Signed)
Pt to ED from home c/o anxiety, jittery feeling tonight.  States takes vimpat and lemotrigine for seizures but started new muscle relaxer yesterday and last took tonight and 30 minutes after started feeling like this.  Denies new pain.

## 2020-04-23 NOTE — Telephone Encounter (Signed)
Rx request 

## 2020-04-23 NOTE — Telephone Encounter (Signed)
Patient name and DOB verified.  Patient states he is continuing to take 1/2 of the BP mediation, Losartan 50mg . He does not know how to understand the reading of his BP. He states systolic numbers of 438 to 887 and diastolic of 80.  Per patient and note in chart 04/17/2020, he was told to cut back medication d/t low BP.   He has a monitor device at home.  Advised to take the BP at home and record them. Informed patient of BP goal: 130/80 or lower. Will route message to PCP to see if patient can return to see Lurena Joiner or PCP for f/u BP.

## 2020-04-23 NOTE — Telephone Encounter (Signed)
Appointment can be scheduled with either myself or Luke in follow-up of patient's blood pressure

## 2020-04-24 ENCOUNTER — Emergency Department
Admission: EM | Admit: 2020-04-24 | Discharge: 2020-04-24 | Disposition: A | Discharge status: Home / Self Care | Payer: Federal, State, Local not specified - Other | Attending: Emergency Medicine | Admitting: Emergency Medicine

## 2020-04-24 DIAGNOSIS — R079 Chest pain, unspecified: Secondary | ICD-10-CM

## 2020-04-24 DIAGNOSIS — F419 Anxiety disorder, unspecified: Secondary | ICD-10-CM

## 2020-04-24 DIAGNOSIS — R451 Restlessness and agitation: Secondary | ICD-10-CM

## 2020-04-24 LAB — TSH: TSH: 3.358 u[IU]/mL (ref 0.350–4.500)

## 2020-04-24 LAB — TROPONIN I (HIGH SENSITIVITY): Troponin I (High Sensitivity): 5 ng/L (ref <18)

## 2020-04-24 LAB — T4, FREE: Free T4: 0.87 ng/dL (ref 0.61–1.12)

## 2020-04-24 NOTE — Telephone Encounter (Signed)
pls schedule appt to discuss HTN and Medication management with Joshua [name] by 10/15.

## 2020-04-24 NOTE — ED Provider Notes (Signed)
[facility] Emergency Department Provider Note   ____________________________________________    I have reviewed the triage vital signs and the nursing notes.   HISTORY  Chief Complaint Anxiety     HPI Joshua [name] is a 59 y.o. male with a history as noted below who presents with complaints of a feeling of agitation.  Patient reports that he is very careful to take his seizure medications as prescribed and he has been doing quite well with them.  Recently he had a back injury and was prescribed new medications and he thinks that these may be affecting him causing him to feel "jittery ".  No seizure activity.  No headache.  No neuro deficits.  He does have some anxiety.  Review of records demonstrates that he was prescribed Flexeril recently.  In addition he reports that he had a brief sharp episode of chest pain while in the waiting room now resolved  Past Medical History:  Diagnosis Date  . Brain tumor ([place]) 02/20/2013   brain tumor removed in March 2014, Dr Donald Pore  . Diffuse idiopathic skeletal hyperostosis 06/05/2013  . Dizziness   . Enlarged prostate   . Headache(784.0)    scattered  . Hypertension   . 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 Meningioma ([place])   . Meningioma, recurrent of brain ([place]) 01/31/2019  . Neuropathy 12/05/2019  . Seizures ([place])    11/27/19 last sz 1 wk ago  . Stroke [facility])     Patient Active Problem List   Diagnosis Date Noted  . Neuropathy 12/05/2019  . 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 Seizures ([place]) 01/31/2019  . Meningioma, recurrent of brain ([place]) 01/31/2019  . Meningioma ([place]) 01/03/2019  . Brain tumor ([place]) 12/27/2018  . Diffuse idiopathic skeletal hyperostosis 06/05/2013    Past Surgical History:  Procedure Laterality Date  . APPLICATION OF CRANIAL NAVIGATION N/A 01/10/2019   Procedure: APPLICATION OF CRANIAL NAVIGATION;  Surgeon: Erline Levine, MD;   Location: [place];  Service: Neurosurgery;  Laterality: N/A;  . CATARACT EXTRACTION Right   . CRANIOTOMY Right 11/06/2012   Procedure: CRANIOTOMY TUMOR EXCISION;  Surgeon: Erline Levine, MD;  Location: [place] NEURO ORS;  Service: Neurosurgery;  Laterality: Right;  Right Parasagittal craniotomy for meningioma with [place]  . CRANIOTOMY Right 01/10/2019   Procedure: Right Parasagittal Craniotomy for Tumor;  Surgeon: Erline Levine, MD;  Location: [place];  Service: Neurosurgery;  Laterality: Right;  Right parasagittal craniotomy for tumor  . ESOPHAGOGASTRODUODENOSCOPY    . right knee arthroscopy      Prior to Admission medications   Medication Sig Start Date End Date Taking? Authorizing Provider  aspirin EC 81 MG EC tablet Take 1 tablet (81 mg total) by mouth daily. 01/14/19   Erline Levine, MD  atorvastatin (LIPITOR) 20 MG tablet Take 1 tablet (20 mg total) by mouth daily. 11/26/19   Charlott Rakes, MD  clonazePAM (KLONOPIN) 0.5 MG tablet TAKE 1 TABLET(0.5 MG) BY MOUTH TWICE DAILY Patient taking differently: Take 0.5 mg by mouth 2 (two) times daily.  12/12/19   [name], MD  FIBER ADULT GUMMIES PO Take 2 tablets by mouth daily.  Patient not taking: Reported on 04/15/2020    [provider]  gabapentin (NEURONTIN) 300 MG capsule Take 1 capsule (300 mg total) by mouth 2 (two) times daily. 12/03/19   [name], MD  Lacosamide 100 MG TABS Take 2 tablets (200 mg total) by mouth  in the morning and at bedtime. 02/04/20   [name], MD  lamoTRIgine (LAMICTAL) 100 MG tablet TAKE 2 TABLETS (200 MG TOTAL) BY MOUTH 2 (TWO) TIMES DAILY. 04/23/20   Vaslow, Acey Lav, MD  lidocaine (LIDODERM) 5 % Place 1 patch onto the skin daily. Remove & Discard patch within 12 hours or as directed by MD Patient not taking: Reported on 04/15/2020 02/23/20   Recardo Evangelist, PA-C  LORazepam (ATIVAN) 1 MG tablet Take 1 tablet (1 mg total) by mouth every 8 (eight) hours as needed for seizure (for breakthrough  seizures). Patient not taking: Reported on 04/07/2020 12/26/19   [name], MD  losartan (COZAAR) 50 MG tablet Take 1 tablet (50 mg total) by mouth daily. To lower blood pressure 04/15/20   Fulp, Cammie, MD  methocarbamol (ROBAXIN) 500 MG tablet Take 1 tablet (500 mg total) by mouth every 8 (eight) hours as needed for muscle spasms. 04/22/20   Davonna Belling, MD  mirtazapine (REMERON) 15 MG tablet Take 15 mg by mouth at bedtime as needed (Sleep).     [provider]  omeprazole (PRILOSEC) 20 MG capsule One pill twice daily- morning and before bedtime to reduce stomach acid 04/15/20   Fulp, Cammie, MD  ondansetron (ZOFRAN-ODT) 4 MG disintegrating tablet Take 1 tablet (4 mg total) by mouth every 8 (eight) hours as needed for nausea or vomiting. 02/04/20   Vaslow, Acey Lav, MD  vitamin C (ASCORBIC ACID) 500 MG tablet Take 500 mg by mouth daily. Patient not taking: Reported on 04/15/2020    [provider]     Allergies Patient has no known allergies.  Family History  Problem Relation Age of Onset  . Hypertension Mother   . Dementia Mother   . Diabetes Mother   . Hypertension Father   . CAD Father   . Diabetes Father   . CAD Brother     Social History Social History   Tobacco Use  . Smoking status: Former Smoker    Types: Cigarettes    Quit date: 01/29/2019    Years since quitting: 1.2  . Smokeless tobacco: Never Used  . Tobacco comment: hasn't smoked in a week  Vaping Use  . Vaping Use: Never used  Substance Use Topics  . Alcohol use: Yes  . Drug use: No    Review of Systems  Constitutional: No fever/chills Eyes: No visual changes.  ENT: No sore throat. Cardiovascular: Denies chest pain. Respiratory: Denies shortness of breath. Gastrointestinal: No abdominal pain.  No nausea, no vomiting.   Genitourinary: Negative for dysuria. Musculoskeletal: As above Skin: Negative for rash. Neurological: Negative for headaches or weakness    ____________________________________________   PHYSICAL EXAM:  VITAL SIGNS: ED Triage Vitals [04/23/20 2307]  Enc Vitals Group     BP (!) 171/87     Pulse Rate 76     Resp 14     Temp 98.2 F (36.8 C)     Temp Source Oral     SpO2 99 %     Weight 81.6 kg (180 lb)     Height 1.753 m (5\' 9" )     Head Circumference      Peak Flow      Pain Score 0     Pain Loc      Pain Edu?      Excl. in [place]?     Constitutional: Alert and oriented. No acute distress.  Nose: No congestion/rhinnorhea. Mouth/Throat: Mucous membranes are moist.  Neck:  Painless ROM Cardiovascular: Normal rate, regular rhythm. Grossly normal heart sounds.  Good peripheral circulation. Respiratory: Normal respiratory effort.  No retractions. Lungs CTAB. Gastrointestinal: Soft and nontender. No distention.  No CVA tenderness. Genitourinary: deferred Musculoskeletal: No lower extremity tenderness nor edema.  Warm and well perfused Neurologic:  Normal speech and language. No gross focal neurologic deficits are appreciated.  Skin:  Skin is warm, dry and intact. No rash noted. Psychiatric: Mood and affect are normal. Speech and behavior are normal.  ____________________________________________   LABS (all labs ordered are listed, but only abnormal results are displayed)  Labs Reviewed  BASIC METABOLIC PANEL - Abnormal; Notable for the following components:      Result Value   Glucose, Bld 115 (*)    All other components within normal limits  CBC  TSH  T4, FREE  TROPONIN I (HIGH SENSITIVITY)   ____________________________________________  EKG  ED ECG REPORT I, Lavonia Drafts, the attending physician, personally viewed and interpreted this ECG.  Date: 04/24/2020  Rhythm: normal sinus rhythm QRS Axis: normal Intervals: normal ST/T Wave abnormalities: Nonspecific changes Narrative Interpretation: no evidence of acute ischemia  ____________________________________________  RADIOLOGY  None  ____________________________________________   PROCEDURES  Procedure(s) performed: No  Procedures   Critical Care performed: No ____________________________________________   INITIAL IMPRESSION / ASSESSMENT AND PLAN / ED COURSE  Pertinent labs & imaging results that were available during my care of the patient were reviewed by me and considered in my medical decision making (see chart for details).  Patient well-appearing in no acute distress, vital signs are reassuring, exam is benign.  He is quite concerned that his medication may be affecting him, it appears that he was recently prescribed Flexeril, I indicated to him that stopping this medication would be appropriate and he can substitute over-the-counter analgesics as needed as he seems to be doing better.  Chest pain is not consistent with ACS and EKG and troponin are reassuring.  He is asymptomatic at this time.  No shortness of breath or fevers or chills to suggest viral illness.  Lab work is normal today  Recommend outpatient follow-up with PCP.    ____________________________________________   FINAL CLINICAL IMPRESSION(S) / ED DIAGNOSES  Final diagnoses:  Restlessness and agitation  Chest pain, unspecified type  Anxiety        Note:  This document was prepared using Dragon voice recognition software and may include unintentional dictation errors.   Lavonia Drafts, MD 04/24/20 1031

## 2020-04-25 ENCOUNTER — Other Ambulatory Visit: Payer: Self-pay

## 2020-04-25 ENCOUNTER — Encounter (HOSPITAL_COMMUNITY): Payer: Self-pay | Admitting: *Deleted

## 2020-04-25 ENCOUNTER — Emergency Department (HOSPITAL_COMMUNITY)
Admission: EM | Admit: 2020-04-25 | Discharge: 2020-04-26 | Disposition: A | Discharge status: Home / Self Care | Payer: Medicaid Other | Attending: Emergency Medicine | Admitting: Emergency Medicine

## 2020-04-25 DIAGNOSIS — Z5321 Procedure and treatment not carried out due to patient leaving prior to being seen by health care provider: Secondary | ICD-10-CM | POA: Diagnosis not present

## 2020-04-25 DIAGNOSIS — R110 Nausea: Secondary | ICD-10-CM | POA: Diagnosis not present

## 2020-04-25 LAB — COMPREHENSIVE METABOLIC PANEL
ALT: 28 U/L (ref 0–44)
AST: 17 U/L (ref 15–41)
Albumin: 4.2 g/dL (ref 3.5–5.0)
Alkaline Phosphatase: 62 U/L (ref 38–126)
Anion gap: 12 (ref 5–15)
BUN: 12 mg/dL (ref 6–20)
CO2: 25 mmol/L (ref 22–32)
Calcium: 9.4 mg/dL (ref 8.9–10.3)
Chloride: 104 mmol/L (ref 98–111)
Creatinine, Ser: 0.92 mg/dL (ref 0.61–1.24)
GFR, Estimated: >60 mL/min (ref >60)
Glucose, Bld: 102 mg/dL — ABNORMAL HIGH (ref 70–99)
Potassium: 4.1 mmol/L (ref 3.5–5.1)
Sodium: 141 mmol/L (ref 135–145)
Total Bilirubin: 0.5 mg/dL (ref 0.3–1.2)
Total Protein: 7.2 g/dL (ref 6.5–8.1)

## 2020-04-25 LAB — CBC
HCT: 46.3 % (ref 39.0–52.0)
Hemoglobin: 15.4 g/dL (ref 13.0–17.0)
MCH: 30.9 pg (ref 26.0–34.0)
MCHC: 33.3 g/dL (ref 30.0–36.0)
MCV: 93 fL (ref 80.0–100.0)
Platelets: 259 10*3/uL (ref 150–400)
RBC: 4.98 MIL/uL (ref 4.22–5.81)
RDW: 12.6 % (ref 11.5–15.5)
WBC: 6.7 10*3/uL (ref 4.0–10.5)
nRBC: 0 % (ref 0.0–0.2)

## 2020-04-25 LAB — LIPASE, BLOOD: Lipase: 36 U/L (ref 11–51)

## 2020-04-25 NOTE — ED Triage Notes (Addendum)
Pt states he started to feel fatigue and having diarrhea after leaving [facility] on Thursday. One stool reported today and nausea started today.  At end of triage pt states he was started on bp meds last week.

## 2020-04-28 DIAGNOSIS — R7309 Other abnormal glucose: Secondary | ICD-10-CM | POA: Diagnosis not present

## 2020-04-28 DIAGNOSIS — H538 Other visual disturbances: Secondary | ICD-10-CM | POA: Diagnosis not present

## 2020-04-29 ENCOUNTER — Telehealth: Payer: Self-pay | Admitting: *Deleted

## 2020-04-29 NOTE — Telephone Encounter (Signed)
Received call from patient. He states he is having serious issues with anxiety and anger-waking up angry and going to bed angry. He went to the ED due to the anxiety on 04/24/20.  He is concerned that his medications are causing this.  He would like Dr. Renda Rolls input regarding this issue.  He states the anxiety and irriation feelings are quite troubling to him. He states he has an appt with his PCP on 05/06/20. Denies seizure activity, fever, chills etc.  Please advise.

## 2020-04-30 ENCOUNTER — Emergency Department: Payer: Medicaid Other

## 2020-04-30 ENCOUNTER — Other Ambulatory Visit: Payer: Self-pay

## 2020-04-30 ENCOUNTER — Other Ambulatory Visit: Payer: Self-pay | Admitting: Family Medicine

## 2020-04-30 ENCOUNTER — Ambulatory Visit: Payer: Self-pay | Admitting: *Deleted

## 2020-04-30 ENCOUNTER — Encounter: Payer: Self-pay | Admitting: *Deleted

## 2020-04-30 DIAGNOSIS — G9389 Other specified disorders of brain: Secondary | ICD-10-CM | POA: Diagnosis not present

## 2020-04-30 DIAGNOSIS — I700 Atherosclerosis of aorta: Secondary | ICD-10-CM

## 2020-04-30 DIAGNOSIS — Z85841 Personal history of malignant neoplasm of brain: Secondary | ICD-10-CM | POA: Diagnosis not present

## 2020-04-30 DIAGNOSIS — Z87891 Personal history of nicotine dependence: Secondary | ICD-10-CM | POA: Insufficient documentation

## 2020-04-30 DIAGNOSIS — Z7982 Long term (current) use of aspirin: Secondary | ICD-10-CM | POA: Diagnosis not present

## 2020-04-30 DIAGNOSIS — I10 Essential (primary) hypertension: Secondary | ICD-10-CM | POA: Insufficient documentation

## 2020-04-30 DIAGNOSIS — R42 Dizziness and giddiness: Secondary | ICD-10-CM | POA: Diagnosis not present

## 2020-04-30 DIAGNOSIS — Z79899 Other long term (current) drug therapy: Secondary | ICD-10-CM | POA: Diagnosis not present

## 2020-04-30 DIAGNOSIS — R531 Weakness: Secondary | ICD-10-CM | POA: Diagnosis not present

## 2020-04-30 LAB — BASIC METABOLIC PANEL
Anion gap: 11 (ref 5–15)
BUN: 19 mg/dL (ref 6–20)
CO2: 25 mmol/L (ref 22–32)
Calcium: 9.3 mg/dL (ref 8.9–10.3)
Chloride: 105 mmol/L (ref 98–111)
Creatinine, Ser: 0.97 mg/dL (ref 0.61–1.24)
GFR, Estimated: >60 mL/min (ref >60)
Glucose, Bld: 143 mg/dL — ABNORMAL HIGH (ref 70–99)
Potassium: 3.7 mmol/L (ref 3.5–5.1)
Sodium: 141 mmol/L (ref 135–145)

## 2020-04-30 LAB — CBC
HCT: 45.6 % (ref 39.0–52.0)
Hemoglobin: 15.8 g/dL (ref 13.0–17.0)
MCH: 31.2 pg (ref 26.0–34.0)
MCHC: 34.6 g/dL (ref 30.0–36.0)
MCV: 89.9 fL (ref 80.0–100.0)
Platelets: 287 10*3/uL (ref 150–400)
RBC: 5.07 MIL/uL (ref 4.22–5.81)
RDW: 12.7 % (ref 11.5–15.5)
WBC: 7.9 10*3/uL (ref 4.0–10.5)
nRBC: 0 % (ref 0.0–0.2)

## 2020-04-30 LAB — URINALYSIS, COMPLETE (UACMP) WITH MICROSCOPIC
Bacteria, UA: NONE SEEN
Bilirubin Urine: NEGATIVE
Glucose, UA: NEGATIVE mg/dL
Hgb urine dipstick: NEGATIVE
Ketones, ur: NEGATIVE mg/dL
Leukocytes,Ua: NEGATIVE
Nitrite: NEGATIVE
Protein, ur: 30 mg/dL — AB
Specific Gravity, Urine: 1.023 (ref 1.005–1.030)
Squamous Epithelial / HPF: NONE SEEN (ref 0–5)
pH: 6 (ref 5.0–8.0)

## 2020-04-30 LAB — TROPONIN I (HIGH SENSITIVITY): Troponin I (High Sensitivity): 6 ng/L (ref <18)

## 2020-04-30 MED ORDER — ATORVASTATIN CALCIUM 20 MG PO TABS: 20.0000 mg | ORAL_TABLET | Freq: Every day | ORAL | 0 refills | Status: DC

## 2020-04-30 NOTE — Telephone Encounter (Signed)
  Patient is calling to report his BP is very high- he is not having symptoms- but since it is so close to 841 systolic advised UC for evaluation- he is going to have it checked at pharmacy again and will follow advise. Reason for Disposition . [3] Systolic BP  >= 244 OR Diastolic >= 010  AND [2] having NO cardiac or neurologic symptoms  Answer Assessment - Initial Assessment Questions 1. BLOOD PRESSURE: "What is the blood pressure?" "Did you take at least two measurements 5 minutes apart?"     196/100, patient reports [facility] is at [facility] to [facility](cholesterol now) and he will have them check it again.  2. ONSET: "When did you take your blood pressure?"     4:00 today 3. HOW: "How did you obtain the blood pressure?" (e.g., visiting nurse, automatic home BP monitor)     manual 4. HISTORY: "Do you have a history of high blood pressure?"     Newly diagnosed 5. MEDICATIONS: "Are you taking any medications for blood pressure?" "Have you missed any doses recently?"     Just started BP medication 1-2 weeks 6. OTHER SYMPTOMS: "Do you have any symptoms?" (e.g., headache, chest pain, blurred vision, difficulty breathing, weakness)     no 7. PREGNANCY: "Is there any chance you are pregnant?" "When was your last menstrual period?"     n/a  Protocols used: BLOOD PRESSURE - HIGH-A-AH

## 2020-04-30 NOTE — Telephone Encounter (Signed)
Pt called back to report BP to triage, EMS came to his house and decided not to take him because his BP went down. Last reading 143/87. He wanted to make triage aware of this.  Called patient for follow up call and no answer. Left message to call back if any further issues noted.

## 2020-04-30 NOTE — Telephone Encounter (Signed)
Patient called stating that his BP went high today. He had a headache and blurry vision. He called EMS and they evaluated 196/100 and the second time went down to 143/87. He states they decided to not take him to the ER. He instead went to UC and they would not treat him with his BP elevated. He states he has been halfing his medication for hypertension. He has taken a whole dose today.He has no symptoms at this time. He is requesting appointment. Protocol states he needs OV within 24 hours Care advice read to patient Will route note to office for follow up in Am. Reason for Disposition . Systolic BP  >= 254 OR Diastolic >= 982  Answer Assessment - Initial Assessment Questions 1. BLOOD PRESSURE: "What is the blood pressure?" "Did you take at least two measurements 5 minutes apart?"     143/87 with EMS went down from196/100 2. ONSET: "When did you take your blood pressure?"    today 3. HOW: "How did you obtain the blood pressure?" (e.g., visiting nurse, automatic home BP monitor)     [place] station [place] 4. HISTORY: "Do you have a history of high blood pressure?"     yes 5. MEDICATIONS: "Are you taking any medications for blood pressure?" "Have you missed any doses recently?"   Only was taking 1/2 tab till today He says he started whole tab today 6. OTHER SYMPTOMS: "Do you have any symptoms?" (e.g., headache, chest pain, blurred vision, difficulty breathing, weakness)     Headache when BP was high vision got blurry 7. PREGNANCY: "Is there any chance you are pregnant?" "When was your last menstrual period?"    N/A  Protocols used: BLOOD PRESSURE - HIGH-A-AH

## 2020-04-30 NOTE — Telephone Encounter (Signed)
Medication Refill - Medication: atorvastatin (LIPITOR) 20 MG tablet    Has the patient contacted their pharmacy? No. (Agent: If no, request that the patient contact the pharmacy for the refill.) (Agent: If yes, when and what did the pharmacy advise?)  Preferred Pharmacy (with phone number or street name): [facility] [address] [facility] [place]  Agent: Please be advised that RX refills may take up to 3 business days. We ask that you follow-up with your pharmacy.

## 2020-04-30 NOTE — ED Triage Notes (Signed)
Pt states he has been hypertensive x 2 days. Pt states he started new blood pressure med x 2 wks ago. Pt states today his dose was increased to a full tab of BP med (losartaan 50 mg daily is present dose). Pt sent from urgent care to ED due to HTN. Pt continues to be dizzy and feel weak. Pt has mobility difficulties and uses a cane due to multiple craniotomies.

## 2020-05-01 ENCOUNTER — Ambulatory Visit: Payer: Medicaid Other | Attending: Family Medicine | Admitting: Pharmacist

## 2020-05-01 ENCOUNTER — Emergency Department
Admission: EM | Admit: 2020-05-01 | Discharge: 2020-05-01 | Disposition: A | Discharge status: Home / Self Care | Payer: Medicaid Other | Attending: Emergency Medicine | Admitting: Emergency Medicine

## 2020-05-01 ENCOUNTER — Ambulatory Visit: Payer: Medicaid Other | Admitting: Pharmacist

## 2020-05-01 ENCOUNTER — Encounter: Payer: Self-pay | Admitting: Pharmacist

## 2020-05-01 ENCOUNTER — Ambulatory Visit
Admission: RE | Admit: 2020-05-01 | Discharge: 2020-05-01 | Disposition: A | Discharge status: Home / Self Care | Payer: Medicaid Other | Source: Ambulatory Visit | Attending: Family | Admitting: Family

## 2020-05-01 VITALS — BP 129/85

## 2020-05-01 DIAGNOSIS — M19011 Primary osteoarthritis, right shoulder: Secondary | ICD-10-CM | POA: Diagnosis not present

## 2020-05-01 DIAGNOSIS — M7541 Impingement syndrome of right shoulder: Secondary | ICD-10-CM

## 2020-05-01 DIAGNOSIS — I10 Essential (primary) hypertension: Secondary | ICD-10-CM

## 2020-05-01 DIAGNOSIS — R42 Dizziness and giddiness: Secondary | ICD-10-CM

## 2020-05-01 DIAGNOSIS — M7521 Bicipital tendinitis, right shoulder: Secondary | ICD-10-CM | POA: Diagnosis not present

## 2020-05-01 DIAGNOSIS — M75121 Complete rotator cuff tear or rupture of right shoulder, not specified as traumatic: Secondary | ICD-10-CM | POA: Diagnosis not present

## 2020-05-01 LAB — TROPONIN I (HIGH SENSITIVITY): Troponin I (High Sensitivity): 5 ng/L (ref <18)

## 2020-05-01 NOTE — Telephone Encounter (Signed)
Patient was seen this morning with Joshua [name]. BP concerns and medication were addressed at the [facility].

## 2020-05-01 NOTE — Progress Notes (Signed)
   S:    PCP: Dr. Chapman Fitch  Patient arrives in good spirits. Presents to the clinic for hypertension evaluation, counseling, and management. Patient was referred and last seen by Primary Care Provider on 04/15/2020.   Briefly, pt has several ED visits for hypertension over the past month. He has been on losartan 50 mg before but this was decreased to 25 mg daily d/t dizziness. However, most recently he was instructed to resume the 50 mg dose d/t BP being above goal (see triage note from 04/30/20)  Today, he endorses some dizziness that he describes as unsteadiness when ambulating. Denies lightheadedness, blurred vision. Denies chest pain or dyspnea. No palpitations.   Medication adherence reported. He took his 50 mg tablet of losartan today.   Current BP Medications include:  Losartan 50 mg daily   Dietary habits include: compliant with salt restriction; denies drinking caffeine  Exercise habits include: limited  Family / Social history:  - FHx: HTN, DM, CAD, dementia  - Tobacco: former smoker (quit in 2020) - Alcohol: endorses occasional use     O:  Vitals:   05/01/20 1017  BP: 129/85    Home BP readings: none since ED this AM  Last 3 Office BP readings: BP Readings from Last 3 Encounters:  05/01/20 124/82  04/24/20 117/79  04/22/20 (!) 140/92    BMET    Component Value Date/Time   NA 141 04/30/2020 2059   NA 144 04/15/2020 1515   K 3.7 04/30/2020 2059   CL 105 04/30/2020 2059   CO2 25 04/30/2020 2059   GLUCOSE 143 (H) 04/30/2020 2059   BUN 19 04/30/2020 2059   BUN 13 04/15/2020 1515   CREATININE 0.97 04/30/2020 2059   CALCIUM 9.3 04/30/2020 2059   GFRNONAA >60 04/30/2020 2059   GFRAA >60 04/17/2020 1313    Renal function: Estimated Creatinine Clearance: 82 mL/min (by C-G formula based on SCr of 0.97 mg/dL).  Clinical ASCVD: No  The ASCVD Risk score Mikey Bussing DC Jr., et al., 2013) failed to calculate for the following reasons:   Cannot find a previous HDL lab    Cannot find a previous total cholesterol lab  A/P: Hypertension newly dx currently at goal on current medications. BP Goal = < 130/80 mmHg. Medication adherence reported.  -Continued current regimen.  -Recommend bedtime to aid in side effect prevention.  -Counseled on lifestyle modifications for blood pressure control including reduced dietary sodium, increased exercise, adequate sleep. -Return to ED precautions covered   Results reviewed and written information provided.   Total time in face-to-face counseling 15 minutes.   F/U Clinic Visit next week with Dr. Chapman Fitch.    Benard Halsted, PharmD, [place] [phone]

## 2020-05-01 NOTE — ED Provider Notes (Signed)
[facility] Emergency Department Provider Note   ____________________________________________   First MD Initiated Contact with Patient 05/01/20 [phone]     (approximate)  I have reviewed the triage vital signs and the nursing notes.   HISTORY  Chief Complaint Dizziness and Hypertension    HPI Joshua [name] is a 59 y.o. male who presents to the ED from home with a chief complaint of elevated blood pressure and dizziness.  Patient was started on Losartan 25mg  2 weeks ago by his PCP for elevated blood pressure.  States he feels dizzy only when his blood pressure is elevated.  Yesterday was the first day that he was instructed to take Losartan 50mg .  Endorses stress and anxiety as well.  Denies fever, chest pain, shortness of breath, abdominal pain, nausea, vomiting or headache.  Denies slurred speech, confusion, extremity weakness.  Denies sick contacts.      Past Medical History:  Diagnosis Date  . Brain tumor ([place]) 02/20/2013   brain tumor removed in March 2014, Dr Donald Pore  . Diffuse idiopathic skeletal hyperostosis 06/05/2013  . Dizziness   . Enlarged prostate   . Headache(784.0)    scattered  . Hypertension   . 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 Meningioma ([place])   . Meningioma, recurrent of brain ([place]) 01/31/2019  . Neuropathy 12/05/2019  . Seizures ([place])    11/27/19 last sz 1 wk ago  . Stroke [facility])     Patient Active Problem List   Diagnosis Date Noted  . Neuropathy 12/05/2019  . 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 Seizures ([place]) 01/31/2019  . Meningioma, recurrent of brain ([place]) 01/31/2019  . Meningioma ([place]) 01/03/2019  . Brain tumor ([place]) 12/27/2018  . Diffuse idiopathic skeletal hyperostosis 06/05/2013    Past Surgical History:  Procedure Laterality Date  . APPLICATION OF CRANIAL NAVIGATION N/A 01/10/2019   Procedure: APPLICATION OF CRANIAL NAVIGATION;  Surgeon: Erline Levine, MD;   Location: [place];  Service: Neurosurgery;  Laterality: N/A;  . CATARACT EXTRACTION Right   . CRANIOTOMY Right 11/06/2012   Procedure: CRANIOTOMY TUMOR EXCISION;  Surgeon: Erline Levine, MD;  Location: [place] NEURO ORS;  Service: Neurosurgery;  Laterality: Right;  Right Parasagittal craniotomy for meningioma with Stealth  . CRANIOTOMY Right 01/10/2019   Procedure: Right Parasagittal Craniotomy for Tumor;  Surgeon: Erline Levine, MD;  Location: [place];  Service: Neurosurgery;  Laterality: Right;  Right parasagittal craniotomy for tumor  . ESOPHAGOGASTRODUODENOSCOPY    . right knee arthroscopy      Prior to Admission medications   Medication Sig Start Date End Date Taking? Authorizing Provider  aspirin EC 81 MG EC tablet Take 1 tablet (81 mg total) by mouth daily. 01/14/19   Erline Levine, MD  atorvastatin (LIPITOR) 20 MG tablet Take 1 tablet (20 mg total) by mouth daily. 04/30/20   Charlott Rakes, MD  clonazePAM (KLONOPIN) 0.5 MG tablet TAKE 1 TABLET(0.5 MG) BY MOUTH TWICE DAILY Patient taking differently: Take 0.5 mg by mouth 2 (two) times daily.  12/12/19   [name], MD  FIBER ADULT GUMMIES PO Take 2 tablets by mouth daily.  Patient not taking: Reported on 04/15/2020    [provider]  gabapentin (NEURONTIN) 300 MG capsule Take 1 capsule (300 mg total) by mouth 2 (two) times daily. 12/03/19   [name], MD  Lacosamide 100 MG TABS Take 2 tablets (200 mg total) by mouth in the morning and at bedtime. 02/04/20  [name], MD  lamoTRIgine (LAMICTAL) 100 MG tablet TAKE 2 TABLETS (200 MG TOTAL) BY MOUTH 2 (TWO) TIMES DAILY. 04/23/20   Vaslow, Acey Lav, MD  lidocaine (LIDODERM) 5 % Place 1 patch onto the skin daily. Remove & Discard patch within 12 hours or as directed by MD Patient not taking: Reported on 04/15/2020 02/23/20   Recardo Evangelist, PA-C  LORazepam (ATIVAN) 1 MG tablet Take 1 tablet (1 mg total) by mouth every 8 (eight) hours as needed for seizure (for breakthrough  seizures). Patient not taking: Reported on 04/07/2020 12/26/19   [name], MD  losartan (COZAAR) 50 MG tablet Take 1 tablet (50 mg total) by mouth daily. To lower blood pressure 04/15/20   Fulp, Cammie, MD  methocarbamol (ROBAXIN) 500 MG tablet Take 1 tablet (500 mg total) by mouth every 8 (eight) hours as needed for muscle spasms. 04/22/20   Davonna Belling, MD  mirtazapine (REMERON) 15 MG tablet Take 15 mg by mouth at bedtime as needed (Sleep).     [provider]  omeprazole (PRILOSEC) 20 MG capsule One pill twice daily- morning and before bedtime to reduce stomach acid 04/15/20   Fulp, Cammie, MD  ondansetron (ZOFRAN-ODT) 4 MG disintegrating tablet Take 1 tablet (4 mg total) by mouth every 8 (eight) hours as needed for nausea or vomiting. 02/04/20   Vaslow, Acey Lav, MD  vitamin C (ASCORBIC ACID) 500 MG tablet Take 500 mg by mouth daily. Patient not taking: Reported on 04/15/2020    [provider]    Allergies Patient has no known allergies.  Family History  Problem Relation Age of Onset  . Hypertension Mother   . Dementia Mother   . Diabetes Mother   . Hypertension Father   . CAD Father   . Diabetes Father   . CAD Brother     Social History Social History   Tobacco Use  . Smoking status: Former Smoker    Types: Cigarettes    Quit date: 01/29/2019    Years since quitting: 1.2  . Smokeless tobacco: Never Used  . Tobacco comment: hasn't smoked in a week  Vaping Use  . Vaping Use: Never used  Substance Use Topics  . Alcohol use: Yes  . Drug use: No    Review of Systems  Constitutional: Positive for elevated blood pressure.  No fever/chills Eyes: No visual changes. ENT: No sore throat. Cardiovascular: Denies chest pain. Respiratory: Denies shortness of breath. Gastrointestinal: No abdominal pain.  No nausea, no vomiting.  No diarrhea.  No constipation. Genitourinary: Negative for dysuria. Musculoskeletal: Negative for back pain. Skin:  Negative for rash. Neurological: Positive for dizziness.  Negative for headaches, focal weakness or numbness.   ____________________________________________   PHYSICAL EXAM:  VITAL SIGNS: ED Triage Vitals  Enc Vitals Group     BP 04/30/20 2041 135/76     Pulse Rate 04/30/20 2041 (!) 102     Resp 04/30/20 2041 18     Temp 04/30/20 2041 98.7 F (37.1 C)     Temp Source 04/30/20 2041 Oral     SpO2 04/30/20 2041 97 %     Weight 04/30/20 2042 185 lb (83.9 kg)     Height 04/30/20 2042 5\' 9"  (1.753 m)     Head Circumference --      Peak Flow --      Pain Score 04/30/20 2041 6     Pain Loc --      Pain Edu? --  Excl. in [place]? --     Constitutional: Alert and oriented. Well appearing and in no acute distress. Eyes: Conjunctivae are normal. PERRL. EOMI. Head: Atraumatic. Nose: No congestion/rhinnorhea. Mouth/Throat: Mucous membranes are moist.   Neck: No stridor.  No carotid bruits.  Supple neck without meningismus. Cardiovascular: Normal rate, regular rhythm. Grossly normal heart sounds.  Good peripheral circulation. Respiratory: Normal respiratory effort.  No retractions. Lungs CTAB. Gastrointestinal: Soft and nontender. No distention. No abdominal bruits. No CVA tenderness. Musculoskeletal: No lower extremity tenderness nor edema.  No joint effusions. Neurologic: Alert and oriented x3.  CN II-XII grossly intact.  Normal speech and language. No gross focal neurologic deficits are appreciated.  Ambulate with cane. Skin:  Skin is warm, dry and intact. No rash noted. Psychiatric: Mood and affect are normal. Speech and behavior are normal.  ____________________________________________   LABS (all labs ordered are listed, but only abnormal results are displayed)  Labs Reviewed  BASIC METABOLIC PANEL - Abnormal; Notable for the following components:      Result Value   Glucose, Bld 143 (*)    All other components within normal limits  URINALYSIS, COMPLETE (UACMP) WITH  MICROSCOPIC - Abnormal; Notable for the following components:   Color, Urine YELLOW (*)    APPearance CLEAR (*)    Protein, ur 30 (*)    All other components within normal limits  CBC  TROPONIN I (HIGH SENSITIVITY)  TROPONIN I (HIGH SENSITIVITY)   ____________________________________________  EKG  ED ECG REPORT I, Prestyn Stanco J, the attending physician, personally viewed and interpreted this ECG.   Date: 05/01/2020  EKG Time: 2053  Rate: 102  Rhythm: sinus tachycardia  Axis: RAD  Intervals:none  ST&T Change: Nonspecific  ____________________________________________  RADIOLOGY Cecilie Kicks J, personally viewed and evaluated these images (plain radiographs) as part of my medical decision making, as well as reviewing the written report by the radiologist.  ED MD interpretation: No acute cardiopulmonary process; no ICH  Official radiology report(s): DG Chest 2 View  Result Date: 05/01/2020 CLINICAL DATA:  Hypertension, dizziness, weakness EXAM: CHEST - 2 VIEW COMPARISON:  04/22/2020 FINDINGS: Lungs are well expanded, symmetric, and clear. No pneumothorax or pleural effusion. Cardiac size within normal limits. Pulmonary vascularity is normal. Osseous structures are age-appropriate. No acute bone abnormality. IMPRESSION: No active cardiopulmonary disease. Electronically Signed   By: Fidela Salisbury MD   On: 05/01/2020 00:02   CT Head Wo Contrast  Result Date: 05/01/2020 CLINICAL DATA:  Dizziness, hypertension EXAM: CT HEAD WITHOUT CONTRAST TECHNIQUE: Contiguous axial images were obtained from the base of the skull through the vertex without intravenous contrast. COMPARISON:  03/16/2020, MRI 04/03/2020 FINDINGS: Brain: Encephalomalacia within the high para falcine frontal lobes, right greater than left, is unchanged. No evidence of acute intracranial hemorrhage or infarct. No abnormal mass effect or midline shift. No abnormal intra or extra-axial mass lesion. Ventricular size is normal.  Cerebellum is unremarkable. Vascular: No asymmetric hyperdense vasculature at the skull base. Skull: Sagittal craniotomy at the vertex is unchanged. No acute calvarial fracture. Sinuses/Orbits: No acute finding. Other: Mastoid air cells and middle ear cavities are clear. IMPRESSION: No acute intracranial hemorrhage or infarct. Stable high bifrontal encephalomalacia. Electronically Signed   By: Fidela Salisbury MD   On: 05/01/2020 00:00    ____________________________________________   PROCEDURES  Procedure(s) performed (including Critical Care):  .1-3 Lead EKG Interpretation Performed by: Paulette Blanch, MD Authorized by: Paulette Blanch, MD     Interpretation: normal     ECG  rate:  75   ECG rate assessment: normal     Rhythm: sinus rhythm     Ectopy: none     Conduction: normal   Comments:     Patient placed on cardiac monitor to evaluate for arrhythmias     ____________________________________________   INITIAL IMPRESSION / ASSESSMENT AND PLAN / ED COURSE  As part of my medical decision making, I reviewed the following data within the [place] notes reviewed and incorporated, Labs reviewed, EKG interpreted, Old chart reviewed, Radiograph reviewed and Notes from prior ED visits     59 year old male presenting with elevated blood pressure and dizziness.  Differential diagnosis includes but is not limited to TIA/CVA, ACS, infectious, metabolic etiologies, etc.  Laboratory and imaging results unremarkable including 2 sets of negative troponins.  Blood pressure 132/70.  Last evening was the first dose of Losartan 50mg , increased from 25mg .  Patient asymptomatic and voices no medical complaints.  Has an appointment with his PCP this afternoon.  Instructed to take his morning meds as usual and to keep his appointment.  Strict return precautions given.  Patient verbalizes understanding agrees with plan of care.       ____________________________________________   FINAL CLINICAL IMPRESSION(S) / ED DIAGNOSES  Final diagnoses:  Hypertension, unspecified type  Dizziness     ED Discharge Orders    None      *Please note:  Joshua [name] was evaluated in Emergency Department on 05/01/2020 for the symptoms described in the history of present illness. He was evaluated in the context of the global COVID-19 pandemic, which necessitated consideration that the patient might be at risk for infection with the SARS-CoV-2 virus that causes COVID-19. Institutional protocols and algorithms that pertain to the evaluation of patients at risk for COVID-19 are in a state of rapid change based on information released by regulatory bodies including the CDC and federal and state organizations. These policies and algorithms were followed during the patient's care in the ED.  Some ED evaluations and interventions may be delayed as a result of limited staffing during and the pandemic.*   Note:  This document was prepared using Dragon voice recognition software and may include unintentional dictation errors.   Paulette Blanch, MD 05/01/20 [phone]

## 2020-05-01 NOTE — Telephone Encounter (Signed)
As discussed yesterday, patient should still keep his f/u with Joshua [name]/CPP for his blood pressure recheck

## 2020-05-01 NOTE — ED Notes (Signed)
Pt states that he's been checking his blood pressure and that it has been elevated over the last two days with a systolic in the 090'B. Pt states he's been feeling dizzy, lightheaded, and has had blurred vision. Denies fevers, cp, and shob. NAD noted. Denies current symptoms.

## 2020-05-01 NOTE — Discharge Instructions (Addendum)
Take your morning medications as usual.  Please keep your appointment with your doctor today.  Return to the ER for worsening symptoms, persistent vomiting, difficulty breathing, lethargy or other concerns.

## 2020-05-04 ENCOUNTER — Telehealth: Payer: Self-pay | Admitting: Family Medicine

## 2020-05-04 NOTE — Telephone Encounter (Signed)
Thank you. Will notify pt.

## 2020-05-04 NOTE — Telephone Encounter (Signed)
He needs to contact his neurologist for continued refills of this medication

## 2020-05-04 NOTE — Telephone Encounter (Signed)
Called made aware of MD message

## 2020-05-04 NOTE — Telephone Encounter (Signed)
Pt was advised by pharmacy to contact PCP for refill for clonazePAM (KLONOPIN) 0.5 MG tablet Pt was originally prescribed this by his neurologist but was advised to have transferred to pcp by pharmacy / please advise if Dr. Chapman Fitch can refill or if he needs to contact original prescribing provider   Pt has 4 pills left   If so send to [facility] [address]

## 2020-05-05 ENCOUNTER — Other Ambulatory Visit: Payer: Self-pay | Admitting: Medical Oncology

## 2020-05-05 DIAGNOSIS — C700 Malignant neoplasm of cerebral meninges: Secondary | ICD-10-CM

## 2020-05-05 MED ORDER — CLONAZEPAM 0.5 MG PO TABS: ORAL_TABLET | ORAL | 3 refills | Status: DC

## 2020-05-06 ENCOUNTER — Ambulatory Visit: Payer: Medicaid Other | Attending: Family Medicine | Admitting: Family Medicine

## 2020-05-06 ENCOUNTER — Telehealth: Payer: Self-pay | Admitting: *Deleted

## 2020-05-06 ENCOUNTER — Other Ambulatory Visit: Payer: Self-pay

## 2020-05-06 ENCOUNTER — Encounter: Payer: Self-pay | Admitting: Family Medicine

## 2020-05-06 VITALS — BP 147/92 | HR 90 | Temp 98.2°F | Ht 68.0 in | Wt 196.8 lb

## 2020-05-06 DIAGNOSIS — Z7982 Long term (current) use of aspirin: Secondary | ICD-10-CM | POA: Diagnosis not present

## 2020-05-06 DIAGNOSIS — Z87891 Personal history of nicotine dependence: Secondary | ICD-10-CM | POA: Insufficient documentation

## 2020-05-06 DIAGNOSIS — K219 Gastro-esophageal reflux disease without esophagitis: Secondary | ICD-10-CM

## 2020-05-06 DIAGNOSIS — I10 Essential (primary) hypertension: Secondary | ICD-10-CM | POA: Diagnosis present

## 2020-05-06 DIAGNOSIS — Z79899 Other long term (current) drug therapy: Secondary | ICD-10-CM | POA: Insufficient documentation

## 2020-05-06 DIAGNOSIS — Z8249 Family history of ischemic heart disease and other diseases of the circulatory system: Secondary | ICD-10-CM | POA: Diagnosis not present

## 2020-05-06 DIAGNOSIS — Z7901 Long term (current) use of anticoagulants: Secondary | ICD-10-CM | POA: Diagnosis not present

## 2020-05-06 MED ORDER — AMLODIPINE BESYLATE 5 MG PO TABS: 5.0000 mg | ORAL_TABLET | Freq: Every day | ORAL | 4 refills | Status: DC

## 2020-05-06 MED ORDER — SUCRALFATE 1 G PO TABS: 1.0000 g | ORAL_TABLET | Freq: Three times a day (TID) | ORAL | 2 refills | Status: DC

## 2020-05-06 NOTE — Progress Notes (Signed)
Established Patient Office Visit  Subjective:  Patient ID: Joshua [name], male    DOB: Sep 04, 1960  Age: 59 y.o. MRN: [id number]  CC:  Chief Complaint  Patient presents with  . Medication Problem    losartan    HPI Joshua [name], 59 year old male, seen in follow-up of hypertension for which he was prescribed losartan 50 mg daily and patient with complaint of continued acid reflux symptoms for which she would like a refill of sucralfate/Carafate.  Past Medical History:  Diagnosis Date  . Brain tumor ([place]) 02/20/2013   brain tumor removed in March 2014, Dr Donald Pore  . Diffuse idiopathic skeletal hyperostosis 06/05/2013  . Dizziness   . Enlarged prostate   . Headache(784.0)    scattered  . Hypertension   . 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 Meningioma ([place])   . Meningioma, recurrent of brain ([place]) 01/31/2019  . Neuropathy 12/05/2019  . Seizures ([place])    11/27/19 last sz 1 wk ago  . Stroke [facility])     Past Surgical History:  Procedure Laterality Date  . APPLICATION OF CRANIAL NAVIGATION N/A 01/10/2019   Procedure: APPLICATION OF CRANIAL NAVIGATION;  Surgeon: Erline Levine, MD;  Location: [place];  Service: Neurosurgery;  Laterality: N/A;  . CATARACT EXTRACTION Right   . CRANIOTOMY Right 11/06/2012   Procedure: CRANIOTOMY TUMOR EXCISION;  Surgeon: Erline Levine, MD;  Location: [place] NEURO ORS;  Service: Neurosurgery;  Laterality: Right;  Right Parasagittal craniotomy for meningioma with Stealth  . CRANIOTOMY Right 01/10/2019   Procedure: Right Parasagittal Craniotomy for Tumor;  Surgeon: Erline Levine, MD;  Location: [place];  Service: Neurosurgery;  Laterality: Right;  Right parasagittal craniotomy for tumor  . ESOPHAGOGASTRODUODENOSCOPY    . right knee arthroscopy      Family History  Problem Relation Age of Onset  . Hypertension Mother   . Dementia Mother   . Diabetes Mother   . Hypertension Father   . CAD Father   . Diabetes Father   . CAD Brother      Social History   Socioeconomic History  . Marital status: Married    Spouse name: Not on file  . Number of children: 2  . Years of education: Not on file  . Highest education level: Not on file  Occupational History  . Not on file  Tobacco Use  . Smoking status: Former Smoker    Types: Cigarettes    Quit date: 01/29/2019    Years since quitting: 1.2  . Smokeless tobacco: Never Used  . Tobacco comment: hasn't smoked in a week  Vaping Use  . Vaping Use: Never used  Substance and Sexual Activity  . Alcohol use: Yes  . Drug use: No  . Sexual activity: Yes  Other Topics Concern  . Not on file  Social History Narrative   Lives with wife   Caffeine- soda occass   Social Determinants of Health   Financial Resource Strain:   . Difficulty of Paying Living Expenses: Not on file  Food Insecurity:   . Worried About Charity fundraiser in the Last Year: Not on file  . Ran Out of Food in the Last Year: Not on file  Transportation Needs:   . Lack of Transportation (Medical): Not on file  . Lack of Transportation (Non-Medical): Not on file  Physical Activity:   . Days of Exercise per Week: Not on file  . Minutes of Exercise per Session: Not on file  Stress:   .  Feeling of Stress : Not on file  Social Connections:   . Frequency of Communication with Friends and Family: Not on file  . Frequency of Social Gatherings with Friends and Family: Not on file  . Attends Religious Services: Not on file  . Active Member of Clubs or Organizations: Not on file  . Attends Archivist Meetings: Not on file  . Marital Status: Not on file  Intimate Partner Violence:   . Fear of Current or Ex-Partner: Not on file  . Emotionally Abused: Not on file  . Physically Abused: Not on file  . Sexually Abused: Not on file    Outpatient Medications Prior to Visit  Medication Sig Dispense Refill  . aspirin EC 81 MG EC tablet Take 1 tablet (81 mg total) by mouth daily. 30 tablet 3  .  atorvastatin (LIPITOR) 20 MG tablet Take 1 tablet (20 mg total) by mouth daily. 90 tablet 0  . clonazePAM (KLONOPIN) 0.5 MG tablet TAKE 1 TABLET(0.5 MG) BY MOUTH TWICE DAILY 60 tablet 3  . Lacosamide 100 MG TABS Take 2 tablets (200 mg total) by mouth in the morning and at bedtime. 60 tablet 3  . lamoTRIgine (LAMICTAL) 100 MG tablet TAKE 2 TABLETS (200 MG TOTAL) BY MOUTH 2 (TWO) TIMES DAILY. 120 tablet 0  . losartan (COZAAR) 50 MG tablet Take 1 tablet (50 mg total) by mouth daily. To lower blood pressure 90 tablet 3  . methocarbamol (ROBAXIN) 500 MG tablet Take 1 tablet (500 mg total) by mouth every 8 (eight) hours as needed for muscle spasms. 8 tablet 0  . mirtazapine (REMERON) 15 MG tablet Take 15 mg by mouth at bedtime as needed (Sleep).     Marland Kitchen omeprazole (PRILOSEC) 20 MG capsule One pill twice daily- morning and before bedtime to reduce stomach acid 180 capsule 1  . ondansetron (ZOFRAN-ODT) 4 MG disintegrating tablet Take 1 tablet (4 mg total) by mouth every 8 (eight) hours as needed for nausea or vomiting. 30 tablet 3  . vitamin C (ASCORBIC ACID) 500 MG tablet Take 500 mg by mouth daily.     Marland Kitchen FIBER ADULT GUMMIES PO Take 2 tablets by mouth daily.  (Patient not taking: Reported on 04/15/2020)    . gabapentin (NEURONTIN) 300 MG capsule Take 1 capsule (300 mg total) by mouth 2 (two) times daily. (Patient not taking: Reported on 05/06/2020) 60 capsule 3  . lidocaine (LIDODERM) 5 % Place 1 patch onto the skin daily. Remove & Discard patch within 12 hours or as directed by MD (Patient not taking: Reported on 04/15/2020) 15 patch 0  . LORazepam (ATIVAN) 1 MG tablet Take 1 tablet (1 mg total) by mouth every 8 (eight) hours as needed for seizure (for breakthrough seizures). (Patient not taking: Reported on 04/07/2020) 4 tablet 0   No facility-administered medications prior to visit.    No Known Allergies  ROS Review of Systems  Constitutional: Positive for fatigue. Negative for chills and fever.   Respiratory: Negative for cough and shortness of breath.   Cardiovascular: Negative for chest pain and palpitations.  Gastrointestinal: Negative for abdominal pain, constipation, diarrhea and nausea.       Acid reflux symptoms  Genitourinary: Negative for dysuria and frequency.  Musculoskeletal: Positive for arthralgias, back pain and gait problem.  Neurological: Positive for dizziness (Occasional). Negative for headaches.  Hematological: Negative for adenopathy. Does not bruise/bleed easily.      Objective:    Physical Exam Vitals and nursing note reviewed.  Constitutional:      Appearance: Normal appearance.  Cardiovascular:     Rate and Rhythm: Normal rate and regular rhythm.  Pulmonary:     Effort: Pulmonary effort is normal.     Breath sounds: Normal breath sounds.  Abdominal:     Palpations: Abdomen is soft.     Tenderness: There is no abdominal tenderness. There is no guarding or rebound.  Musculoskeletal:     Right lower leg: No edema.     Left lower leg: No edema.  Skin:    General: Skin is warm and dry.  Neurological:     Mental Status: He is alert.  Psychiatric:        Mood and Affect: Mood normal.        Behavior: Behavior normal.     BP (!) 147/92 (BP Location: Left Arm, Patient Position: Sitting)   Pulse 90   Temp 98.2 F (36.8 C)   Ht 5\' 8"  (1.727 m)   Wt 196 lb 12.8 oz (89.3 kg)   SpO2 95%   BMI 29.92 kg/m  Wt Readings from Last 3 Encounters:  05/06/20 196 lb 12.8 oz (89.3 kg)  04/30/20 185 lb (83.9 kg)  04/23/20 180 lb (81.6 kg)     Health Maintenance Due  Topic Date Due  . TETANUS/TDAP  Never done  . COLONOSCOPY  Never done      Lab Results  Component Value Date   TSH 3.358 04/23/2020   Lab Results  Component Value Date   WBC 7.9 04/30/2020   HGB 15.8 04/30/2020   HCT 45.6 04/30/2020   MCV 89.9 04/30/2020   PLT 287 04/30/2020   Lab Results  Component Value Date   NA 141 04/30/2020   K 3.7 04/30/2020   CO2 25 04/30/2020    GLUCOSE 143 (H) 04/30/2020   BUN 19 04/30/2020   CREATININE 0.97 04/30/2020   BILITOT 0.5 04/25/2020   ALKPHOS 62 04/25/2020   AST 17 04/25/2020   ALT 28 04/25/2020   PROT 7.2 04/25/2020   ALBUMIN 4.2 04/25/2020   CALCIUM 9.3 04/30/2020   ANIONGAP 11 04/30/2020   No results found for: CHOL No results found for: HDL No results found for: LDLCALC No results found for: TRIG No results found for: CHOLHDL Lab Results  Component Value Date   HGBA1C 6.3 (H) 04/15/2020      Assessment & Plan:  1. Essential hypertension Patient is encouraged to take losartan 50 mg daily for treatment of hypertension.  He is not interested in an increase in dose at today's visit.  Educational material on hypertension provided as part of after visit summary.  2. Gastroesophageal reflux disease, unspecified whether esophagitis present Per patient request, refill provided of Carafate and patient is to follow-up with gastroenterology. - sucralfate (CARAFATE) 1 g tablet; Take 1 tablet (1 g total) by mouth 4 (four) times daily -  with meals and at bedtime.  Dispense: 120 tablet; Refill: 2     Follow-up: Return in about 2 months (around 07/06/2020) for HTN-2-3 weeks with Lurena Joiner for BP recheck.   Antony Blackbird, MD

## 2020-05-06 NOTE — Progress Notes (Signed)
Right shoulder Medication issue -blood pressure losartan

## 2020-05-06 NOTE — Telephone Encounter (Signed)
error 

## 2020-05-06 NOTE — Telephone Encounter (Signed)
Copied from [place] [id number]. Topic: General - Inquiry >> May 05, 2020  4:11 PM Gillis Ends D wrote: Reason for CRM: Patient states that he is just as dizzy and light headed as he was when he was taking the blood pressure medication at night. He states that he also has a headache and something needs to be done. He can be reached at [phone]. Please advise  ____________________________ Patient had appointment today with PCP- Dr. Chapman Fitch

## 2020-05-06 NOTE — Patient Instructions (Signed)
   Managing Your Hypertension Hypertension is commonly called high blood pressure. This is when the force of your blood pressing against the walls of your arteries is too strong. Arteries are blood vessels that carry blood from your heart throughout your body. Hypertension forces the heart to work harder to pump blood, and may cause the arteries to become narrow or stiff. Having untreated or uncontrolled hypertension can cause heart attack, stroke, kidney disease, and other problems. What are blood pressure readings? A blood pressure reading consists of a higher number over a lower number. Ideally, your blood pressure should be below 120/80. The first ("top") number is called the systolic pressure. It is a measure of the pressure in your arteries as your heart beats. The second ("bottom") number is called the diastolic pressure. It is a measure of the pressure in your arteries as the heart relaxes. What does my blood pressure reading mean? Blood pressure is classified into four stages. Based on your blood pressure reading, your health care provider may use the following stages to determine what type of treatment you need, if any. Systolic pressure and diastolic pressure are measured in a unit called mm Hg. Normal  Systolic pressure: below 120.  Diastolic pressure: below 80. Elevated  Systolic pressure: 120-129.  Diastolic pressure: below 80. Hypertension stage 1  Systolic pressure: 130-139.  Diastolic pressure: 80-89. Hypertension stage 2  Systolic pressure: 140 or above.  Diastolic pressure: 90 or above. What health risks are associated with hypertension? Managing your hypertension is an important responsibility. Uncontrolled hypertension can lead to:  A heart attack.  A stroke.  A weakened blood vessel (aneurysm).  Heart failure.  Kidney damage.  Eye damage.  Metabolic syndrome.  Memory and concentration problems. What changes can I make to manage my  hypertension? Hypertension can be managed by making lifestyle changes and possibly by taking medicines. Your health care provider will help you make a plan to bring your blood pressure within a normal range. Eating and drinking   Eat a diet that is high in fiber and potassium, and low in salt (sodium), added sugar, and fat. An example eating plan is called the DASH (Dietary Approaches to Stop Hypertension) diet. To eat this way: ? Eat plenty of fresh fruits and vegetables. Try to fill half of your plate at each meal with fruits and vegetables. ? Eat whole grains, such as whole wheat pasta, brown rice, or whole grain bread. Fill about one quarter of your plate with whole grains. ? Eat low-fat diary products. ? Avoid fatty cuts of meat, processed or cured meats, and poultry with skin. Fill about one quarter of your plate with lean proteins such as fish, chicken without skin, beans, eggs, and tofu. ? Avoid premade and processed foods. These tend to be higher in sodium, added sugar, and fat.  Reduce your daily sodium intake. Most people with hypertension should eat less than 1,500 mg of sodium a day.  Limit alcohol intake to no more than 1 drink a day for nonpregnant women and 2 drinks a day for men. One drink equals 12 oz of beer, 5 oz of wine, or 1 oz of hard liquor. Lifestyle  Work with your health care provider to maintain a healthy body weight, or to lose weight. Ask what an ideal weight is for you.  Get at least 30 minutes of exercise that causes your heart to beat faster (aerobic exercise) most days of the week. Activities may include walking, swimming, or biking.    Include exercise to strengthen your muscles (resistance exercise), such as weight lifting, as part of your weekly exercise routine. Try to do these types of exercises for 30 minutes at least 3 days a week.  Do not use any products that contain nicotine or tobacco, such as cigarettes and e-cigarettes. If you need help quitting,  ask your health care provider.  Control any long-term (chronic) conditions you have, such as high cholesterol or diabetes. Monitoring  Monitor your blood pressure at home as told by your health care provider. Your personal target blood pressure may vary depending on your medical conditions, your age, and other factors.  Have your blood pressure checked regularly, as often as told by your health care provider. Working with your health care provider  Review all the medicines you take with your health care provider because there may be side effects or interactions.  Talk with your health care provider about your diet, exercise habits, and other lifestyle factors that may be contributing to hypertension.  Visit your health care provider regularly. Your health care provider can help you create and adjust your plan for managing hypertension. Will I need medicine to control my blood pressure? Your health care provider may prescribe medicine if lifestyle changes are not enough to get your blood pressure under control, and if:  Your systolic blood pressure is 130 or higher.  Your diastolic blood pressure is 80 or higher. Take medicines only as told by your health care provider. Follow the directions carefully. Blood pressure medicines must be taken as prescribed. The medicine does not work as well when you skip doses. Skipping doses also puts you at risk for problems. Contact a health care provider if:  You think you are having a reaction to medicines you have taken.  You have repeated (recurrent) headaches.  You feel dizzy.  You have swelling in your ankles.  You have trouble with your vision. Get help right away if:  You develop a severe headache or confusion.  You have unusual weakness or numbness, or you feel faint.  You have severe pain in your chest or abdomen.  You vomit repeatedly.  You have trouble breathing. Summary  Hypertension is when the force of blood pumping  through your arteries is too strong. If this condition is not controlled, it may put you at risk for serious complications.  Your personal target blood pressure may vary depending on your medical conditions, your age, and other factors. For most people, a normal blood pressure is less than 120/80.  Hypertension is managed by lifestyle changes, medicines, or both. Lifestyle changes include weight loss, eating a healthy, low-sodium diet, exercising more, and limiting alcohol. This information is not intended to replace advice given to you by your health care provider. Make sure you discuss any questions you have with your health care provider. Document Revised: 10/26/2018 Document Reviewed: 06/01/2016 Elsevier Patient Education  2020 Elsevier Inc.  

## 2020-05-08 ENCOUNTER — Ambulatory Visit: Payer: Medicaid Other | Admitting: Family

## 2020-05-08 ENCOUNTER — Telehealth: Payer: Self-pay | Admitting: Licensed Clinical Social Worker

## 2020-05-08 ENCOUNTER — Encounter: Payer: Self-pay | Admitting: Physician Assistant

## 2020-05-08 ENCOUNTER — Ambulatory Visit (INDEPENDENT_AMBULATORY_CARE_PROVIDER_SITE_OTHER): Payer: Medicaid Other | Admitting: Physician Assistant

## 2020-05-08 VITALS — Ht 68.0 in | Wt 196.0 lb

## 2020-05-08 DIAGNOSIS — M7541 Impingement syndrome of right shoulder: Secondary | ICD-10-CM

## 2020-05-08 NOTE — Telephone Encounter (Signed)
Patient stated he is returning a call to [place].  Message was read to patient and he stated he would like a call back as well.  CB# [phone]

## 2020-05-08 NOTE — Progress Notes (Signed)
Office Visit Note   Patient: Joshua [name]           Date of Birth: 1960-08-20           MRN: [id number] Visit Date: 05/08/2020              Requested by: Antony Blackbird, MD [place],  [place] [address] PCP: Antony Blackbird, MD  Chief Complaint  Patient presents with  . Right Shoulder - Follow-up    MRI review       HPI: This is a pleasant 58 year old gentleman who presents today to review the MRI of his right shoulder.  He has a history of a fall onto his right shoulder.  Since then he has had pain and weakness in the right arm.  This continues to progress and he has only able to use his right arm for minimal weightbearing activities.  He uses his left arm and hand mostly.   Assessment & Plan: Visit Diagnoses: No diagnosis found.  Plan: I had a long discussion with the patient today.  He has completely torn supraspinatus and infraspinatus retracted to the level of the glenohumeral joint.  He also has severe tendinopathy in the biceps long head.  I discussed with him a lesser procedure of an arthroscopy that has about a 75% chance of relieving some of his pain.  This could be followed up with physical therapy.  Alternatively we could refer him for evaluation for reverse total shoulder.Marland Kitchen  He thinks he would prefer to try an arthroscopy first.  He understands the risks and that it may not help him regain as much strength or less pain as he would like  Follow-Up Instructions: No follow-ups on file.   Ortho Exam  Patient is alert, oriented, no adenopathy, well-dressed, normal affect, normal respiratory effort.  Positive impingement test decreased range of motion and strength in the right shoulder tender over the biceps   Imaging: No results found. No images are attached to the encounter.  Labs: Lab Results  Component Value Date   HGBA1C 6.3 (H) 04/15/2020   HGBA1C 6.2 (H) 12/03/2019   REPTSTATUS 02/28/2020 FINAL 02/27/2020   CULT MULTIPLE SPECIES PRESENT,  SUGGEST RECOLLECTION (A) 02/27/2020     Lab Results  Component Value Date   ALBUMIN 4.2 04/25/2020   ALBUMIN 4.7 04/15/2020   ALBUMIN 4.3 03/10/2020    Lab Results  Component Value Date   MG 1.8 07/09/2019   No results found for: VD25OH  No results found for: PREALBUMIN CBC EXTENDED Latest Ref Rng & Units 04/30/2020 04/25/2020 04/23/2020  WBC 4.0 - 10.5 K/uL 7.9 6.7 7.4  RBC 4.22 - 5.81 MIL/uL 5.07 4.98 4.86  HGB 13.0 - 17.0 g/dL 15.8 15.4 15.1  HCT 39 - 52 % 45.6 46.3 44.3  PLT 150 - 400 K/uL 287 259 254  NEUTROABS 1.7 - 7.7 K/uL - - -  LYMPHSABS 0.7 - 4.0 K/uL - - -     Body mass index is 29.8 kg/m.  Orders:  No orders of the defined types were placed in this encounter.  No orders of the defined types were placed in this encounter.    Procedures: No procedures performed  Clinical Data: No additional findings.  ROS:  All other systems negative, except as noted in the HPI. Review of Systems  Objective: Vital Signs: Ht 5\' 8"  (1.727 m)   Wt 196 lb (88.9 kg)   BMI 29.80 kg/m   Specialty Comments:  No specialty  comments available.  PMFS History: Patient Active Problem List   Diagnosis Date Noted  . Neuropathy 12/05/2019  . 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[place]) 03/07/2019  . Seizures ([place]) 01/31/2019  . Meningioma, recurrent of brain ([place]) 01/31/2019  . Meningioma ([place]) 01/03/2019  . Brain tumor ([place]) 12/27/2018  . Diffuse idiopathic skeletal hyperostosis 06/05/2013   Past Medical History:  Diagnosis Date  . Brain tumor ([place]) 02/20/2013   brain tumor removed in March 2014, Dr Donald Pore  . Diffuse idiopathic skeletal hyperostosis 06/05/2013  . Dizziness   . Enlarged prostate   . Headache(784.0)    scattered  . Hypertension   . 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 Meningioma ([place])   . Meningioma, recurrent of brain ([place]) 01/31/2019  . Neuropathy 12/05/2019  . Seizures ([place])    11/27/19 last sz 1 wk ago  . Stroke  [facility])     Family History  Problem Relation Age of Onset  . Hypertension Mother   . Dementia Mother   . Diabetes Mother   . Hypertension Father   . CAD Father   . Diabetes Father   . CAD Brother     Past Surgical History:  Procedure Laterality Date  . APPLICATION OF CRANIAL NAVIGATION N/A 01/10/2019   Procedure: APPLICATION OF CRANIAL NAVIGATION;  Surgeon: Erline Levine, MD;  Location: [facility];  Service: Neurosurgery;  Laterality: N/A;  . CATARACT EXTRACTION Right   . CRANIOTOMY Right 11/06/2012   Procedure: CRANIOTOMY TUMOR EXCISION;  Surgeon: Erline Levine, MD;  Location: [facility]UR[facility];  Service: Neurosurgery;  Laterality: Right;  Right Parasagittal craniotomy for meningioma with Stealth  . CRANIOTOMY Right 01/10/2019   Procedure: Right Parasagittal Craniotomy for Tumor;  Surgeon: Erline Levine, MD;  Location: [facility];  Service: Neurosurgery;  Laterality: Right;  Right parasagittal craniotomy for tumor  . ESOPHAGOGASTRODUODENOSCOPY    . right knee arthroscopy     Social History   Occupational History  . Not on file  Tobacco Use  . Smoking status: Former Smoker    Types: Cigarettes    Quit date: 01/29/2019    Years since quitting: 1.2  . Smokeless tobacco: Never Used  . Tobacco comment: hasn't smoked in a week  Vaping Use  . Vaping Use: Never used  Substance and Sexual Activity  . Alcohol use: Yes  . Drug use: No  . Sexual activity: Yes

## 2020-05-08 NOTE — Telephone Encounter (Signed)
Late Entry Incoming call received from patient. He reports recent fall and is concerned about injury. He reports ongoing concern for health and has went to ED for evaluation.   Pt endorses continued difficulty sleeping and increase in irritability. He has not heard back from Neurologist, who he visited with last month. Next scheduled appt with PCP is scheduled for 05/06/2020.   Plan: -Pt agreed to follow up with Neurologist to discuss medication management -Contact LCSW with any additional behavioral health and/or resource needs

## 2020-05-08 NOTE — BH Specialist Note (Signed)
Integrated Behavioral Health Initial Visit  MRN: [id number] Name: Joshua [name]  Number of [facility] Clinician visits:: 1/6 Session Start time: 2:30 PM  Session End time: 3:00 PM Total time: 30  Type of Service: [place] Interpretor:No. Interpretor Name and Language: NA   Warm Hand Off Completed.       SUBJECTIVE: [name] is a 59 y.o. male accompanied by self Patient was referred by Dr. Chapman Fitch for depression and anxiety. Patient reports the following symptoms/concerns: Pt reports increase in depression and anxiety symptoms triggered by chronic medical conditions. Symptoms include withdrawn behavior, racing thoughts, and difficulty sleeping Duration of problem: Ongoing; Severity of problem: moderate  OBJECTIVE: Mood: Anxious and Affect: Appropriate Risk of harm to self or others: No plan to harm self or others  LIFE CONTEXT: Family and Social: Pt reports having a support system School/Work: Pt has medicaid Self-Care: Pt enjoys spending time at [employer] and is participating in bible study to strengthen support system Life Changes: Pt reports difficulty managing depression and anxiety symptoms triggered by chronic health conditions  STRENGTHS: Pt has desire to change Pt has social connections  GOALS ADDRESSED: Patient will: 1. Increase knowledge and/or ability of: coping skills Pt agreed to utilize healthy coping skills discussed 2. Demonstrate ability to: Increase adequate support systems for patient/family Pt agreed to continue participating in bible study at church to strengthen support system and promote positive mood  PROGRESS OF GOALS: Ongoing  INTERVENTIONS: Interventions utilized: Veterinary surgeon, Supportive Counseling and Psychoeducation and/or Health Education  Standardized Assessments completed: GAD-7 and PHQ 2&9  ASSESSMENT: Patient currently experiencing depression and anxiety  triggered by chronic medical conditions. Symptoms have decreased as evidenced by recent phq9 and gad7 scores.    Patient may benefit from therapy and medication management. Healthy coping skills discussed.  PLAN: 1. Follow up with behavioral health clinician on : Contact LCSW with any additional behavioral health and/or resource needs 2. Behavioral recommendations: Utilize strategies discussed 3. Referral(s): [facility] (In Clinic) 4. "From scale of 1-10, how likely are you to follow plan?":   Rebekah [place], LCSW 05/08/2020 11:03 AM

## 2020-05-09 ENCOUNTER — Other Ambulatory Visit: Payer: Self-pay | Admitting: Internal Medicine

## 2020-05-09 ENCOUNTER — Other Ambulatory Visit: Payer: Self-pay | Admitting: Family Medicine

## 2020-05-09 DIAGNOSIS — I700 Atherosclerosis of aorta: Secondary | ICD-10-CM

## 2020-05-10 ENCOUNTER — Other Ambulatory Visit: Payer: Self-pay

## 2020-05-10 ENCOUNTER — Emergency Department
Admission: EM | Admit: 2020-05-10 | Discharge: 2020-05-10 | Disposition: A | Discharge status: Home / Self Care | Payer: Medicaid Other | Attending: Emergency Medicine | Admitting: Emergency Medicine

## 2020-05-10 DIAGNOSIS — Z7982 Long term (current) use of aspirin: Secondary | ICD-10-CM | POA: Diagnosis not present

## 2020-05-10 DIAGNOSIS — Z760 Encounter for issue of repeat prescription: Secondary | ICD-10-CM | POA: Diagnosis not present

## 2020-05-10 DIAGNOSIS — Z79899 Other long term (current) drug therapy: Secondary | ICD-10-CM | POA: Diagnosis not present

## 2020-05-10 DIAGNOSIS — I10 Essential (primary) hypertension: Secondary | ICD-10-CM | POA: Insufficient documentation

## 2020-05-10 DIAGNOSIS — Z87891 Personal history of nicotine dependence: Secondary | ICD-10-CM | POA: Diagnosis not present

## 2020-05-10 MED ORDER — LACOSAMIDE 50 MG PO TABS: 200.0000 mg | ORAL_TABLET | Freq: Two times a day (BID) | ORAL | Status: DC

## 2020-05-10 MED ORDER — LACOSAMIDE 50 MG PO TABS
200.0000 mg | ORAL_TABLET | Freq: Once | ORAL | Status: AC
  Administered 2020-05-10: 200 mg via ORAL
  Filled 2020-05-10: qty 4

## 2020-05-10 MED ORDER — LACOSAMIDE 100 MG PO TABS: 200.0000 mg | ORAL_TABLET | Freq: Two times a day (BID) | ORAL | 1 refills | Status: DC

## 2020-05-10 NOTE — ED Provider Notes (Signed)
[facility] Emergency Department Provider Note   ____________________________________________    I have reviewed the triage vital signs and the nursing notes.   HISTORY  Chief Complaint Medication Refill (out of vimpat)     HPI Joshua [name] is a 59 y.o. male with history as noted below who reports he has run out of his Vimpat.  He is concerned because his Vimpat controls his seizures very well.  He reports he called his neurologist but has not heard back from them.  He has not taken his Vimpat last night or this morning.  He reports he takes 200 mg twice daily.  He is concerned that he will have a seizure if he does not take his Vimpat.  No seizure-like symptoms at this time.  Past Medical History:  Diagnosis Date  . Brain tumor ([place]) 02/20/2013   brain tumor removed in March 2014, Dr Donald Pore  . Diffuse idiopathic skeletal hyperostosis 06/05/2013  . Dizziness   . Enlarged prostate   . Headache(784.0)    scattered  . Hypertension   . 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 Meningioma ([place])   . Meningioma, recurrent of brain ([place]) 01/31/2019  . Neuropathy 12/05/2019  . Seizures ([place])    11/27/19 last sz 1 wk ago  . Stroke [facility])     Patient Active Problem List   Diagnosis Date Noted  . Neuropathy 12/05/2019  . 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[place]) 03/07/2019  . Seizures ([place]) 01/31/2019  . Meningioma, recurrent of brain ([place]) 01/31/2019  . Meningioma ([place]) 01/03/2019  . Brain tumor ([place]) 12/27/2018  . Diffuse idiopathic skeletal hyperostosis 06/05/2013    Past Surgical History:  Procedure Laterality Date  . APPLICATION OF CRANIAL NAVIGATION N/A 01/10/2019   Procedure: APPLICATION OF CRANIAL NAVIGATION;  Surgeon: Erline Levine, MD;  Location: [facility];  Service: Neurosurgery;  Laterality: N/A;  . CATARACT EXTRACTION Right   . CRANIOTOMY Right 11/06/2012   Procedure: CRANIOTOMY TUMOR EXCISION;  Surgeon: Erline Levine, MD;  Location: [facility] NEURO ORS;  Service: Neurosurgery;  Laterality: Right;  Right Parasagittal craniotomy for meningioma with Stealth  . CRANIOTOMY Right 01/10/2019   Procedure: Right Parasagittal Craniotomy for Tumor;  Surgeon: Erline Levine, MD;  Location: [facility];  Service: Neurosurgery;  Laterality: Right;  Right parasagittal craniotomy for tumor  . ESOPHAGOGASTRODUODENOSCOPY    . right knee arthroscopy      Prior to Admission medications   Medication Sig Start Date End Date Taking? Authorizing Provider  amLODipine (NORVASC) 5 MG tablet Take 1 tablet (5 mg total) by mouth daily. To lower blood pressure 05/06/20   Fulp, Cammie, MD  aspirin EC 81 MG EC tablet Take 1 tablet (81 mg total) by mouth daily. 01/14/19   Erline Levine, MD  atorvastatin (LIPITOR) 20 MG tablet Take 1 tablet (20 mg total) by mouth daily. 04/30/20   Charlott Rakes, MD  clonazePAM (KLONOPIN) 0.5 MG tablet TAKE 1 TABLET(0.5 MG) BY MOUTH TWICE DAILY 05/05/20   Vaslow, Acey Lav, MD  FIBER ADULT GUMMIES PO Take 2 tablets by mouth daily.     [provider]  gabapentin (NEURONTIN) 300 MG capsule Take 1 capsule (300 mg total) by mouth 2 (two) times daily. 12/03/19   [name], MD  Lacosamide 100 MG TABS Take 2 tablets (200 mg total) by mouth in the morning and at bedtime. 05/10/20 06/09/20  Lavonia Drafts, MD  lamoTRIgine (LAMICTAL) 100 MG tablet TAKE 2 TABLETS (  200 MG TOTAL) BY MOUTH 2 (TWO) TIMES DAILY. 04/23/20   Vaslow, Acey Lav, MD  lidocaine (LIDODERM) 5 % Place 1 patch onto the skin daily. Remove & Discard patch within 12 hours or as directed by MD 02/23/20   Recardo Evangelist, PA-C  LORazepam (ATIVAN) 1 MG tablet Take 1 tablet (1 mg total) by mouth every 8 (eight) hours as needed for seizure (for breakthrough seizures). 12/26/19   Vaslow, Acey Lav, MD  methocarbamol (ROBAXIN) 500 MG tablet Take 1 tablet (500 mg total) by mouth every 8 (eight) hours as needed for muscle spasms. 04/22/20   Davonna Belling,  MD  mirtazapine (REMERON) 15 MG tablet Take 15 mg by mouth at bedtime as needed (Sleep).     [provider]  omeprazole (PRILOSEC) 20 MG capsule One pill twice daily- morning and before bedtime to reduce stomach acid 04/15/20   Fulp, Cammie, MD  ondansetron (ZOFRAN-ODT) 4 MG disintegrating tablet Take 1 tablet (4 mg total) by mouth every 8 (eight) hours as needed for nausea or vomiting. 02/04/20   Vaslow, Acey Lav, MD  sucralfate (CARAFATE) 1 g tablet Take 1 tablet (1 g total) by mouth 4 (four) times daily -  with meals and at bedtime. 05/06/20   Fulp, Cammie, MD  vitamin C (ASCORBIC ACID) 500 MG tablet Take 500 mg by mouth daily.     [provider]     Allergies Patient has no known allergies.  Family History  Problem Relation Age of Onset  . Hypertension Mother   . Dementia Mother   . Diabetes Mother   . Hypertension Father   . CAD Father   . Diabetes Father   . CAD Brother     Social History Social History   Tobacco Use  . Smoking status: Former Smoker    Types: Cigarettes    Quit date: 01/29/2019    Years since quitting: 1.2  . Smokeless tobacco: Never Used  . Tobacco comment: hasn't smoked in a week  Vaping Use  . Vaping Use: Never used  Substance Use Topics  . Alcohol use: Yes  . Drug use: No    Review of Systems  Constitutional: No fever/chills     Gastrointestinal: No abdominal pain.     Neurological: Negative for headaches     ____________________________________________   PHYSICAL EXAM:  VITAL SIGNS: ED Triage Vitals  Enc Vitals Group     BP 05/10/20 0924 (!) 147/81     Pulse Rate 05/10/20 0924 85     Resp 05/10/20 0924 17     Temp 05/10/20 0924 98.4 F (36.9 C)     Temp Source 05/10/20 0924 Oral     SpO2 05/10/20 0924 94 %     Weight 05/10/20 0925 88.9 kg (196 lb)     Height 05/10/20 0925 1.727 m (5\' 8" )     Head Circumference --      Peak Flow --      Pain Score 05/10/20 0930 0     Pain Loc --      Pain Edu? --       Excl. in [place]? --     Constitutional: Alert and oriented. No acute distress. Pleasant and interactive Eyes: Conjunctivae are normal.     Cardiovascular: Normal rate, regular rhythm.  Respiratory: Normal respiratory effort.  No retractions.  Musculoskeletal: No lower extremity tenderness nor edema.   Neurologic:  Normal speech and language. No gross focal neurologic deficits are appreciated.   Skin:  Skin is warm, dry and intact. No rash noted.   ____________________________________________   LABS (all labs ordered are listed, but only abnormal results are displayed)  Labs Reviewed - No data to display ____________________________________________  EKG   ____________________________________________  RADIOLOGY  None ____________________________________________   PROCEDURES  Procedure(s) performed: No  Procedures   Critical Care performed: No ____________________________________________   INITIAL IMPRESSION / ASSESSMENT AND PLAN / ED COURSE  Pertinent labs & imaging results that were available during my care of the patient were reviewed by me and considered in my medical decision making (see chart for details).  Patient well-appearing and asymptomatic here in the emergency department, exam and vitals are unremarkable.  200 mg of Vimpat given, refill provided.   ____________________________________________   FINAL CLINICAL IMPRESSION(S) / ED DIAGNOSES  Final diagnoses:  Medication refill      NEW MEDICATIONS STARTED DURING THIS VISIT:  New Prescriptions   LACOSAMIDE 100 MG TABS    Take 2 tablets (200 mg total) by mouth in the morning and at bedtime.     Note:  This document was prepared using Dragon voice recognition software and may include unintentional dictation errors.   Lavonia Drafts, MD 05/10/20 1036

## 2020-05-10 NOTE — ED Notes (Signed)
Patient here for medication refill of seizure medication. Tolerated po dose of meds here. Understands he is to pick up his prescription at [facility] on [address] today.

## 2020-05-10 NOTE — ED Triage Notes (Addendum)
Pt states he took his last vimpat yesterday but did not having any last night or this morning- pt states he feels like he is going to have a seizure- pt called his doctor and urgent care but both stated they could not help him so he came here- denies any recent seizures

## 2020-05-12 ENCOUNTER — Encounter (HOSPITAL_COMMUNITY): Payer: Self-pay

## 2020-05-12 ENCOUNTER — Emergency Department (HOSPITAL_COMMUNITY): Payer: Medicaid Other

## 2020-05-12 ENCOUNTER — Other Ambulatory Visit: Payer: Self-pay

## 2020-05-12 ENCOUNTER — Emergency Department (HOSPITAL_COMMUNITY)
Admission: EM | Admit: 2020-05-12 | Discharge: 2020-05-12 | Disposition: A | Discharge status: Home / Self Care | Payer: Medicaid Other | Attending: Emergency Medicine | Admitting: Emergency Medicine

## 2020-05-12 ENCOUNTER — Telehealth: Payer: Self-pay | Admitting: Family Medicine

## 2020-05-12 DIAGNOSIS — R0789 Other chest pain: Secondary | ICD-10-CM | POA: Insufficient documentation

## 2020-05-12 DIAGNOSIS — R079 Chest pain, unspecified: Secondary | ICD-10-CM | POA: Diagnosis not present

## 2020-05-12 DIAGNOSIS — I10 Essential (primary) hypertension: Secondary | ICD-10-CM | POA: Diagnosis not present

## 2020-05-12 DIAGNOSIS — R61 Generalized hyperhidrosis: Secondary | ICD-10-CM | POA: Insufficient documentation

## 2020-05-12 DIAGNOSIS — Z87891 Personal history of nicotine dependence: Secondary | ICD-10-CM | POA: Insufficient documentation

## 2020-05-12 DIAGNOSIS — Z7982 Long term (current) use of aspirin: Secondary | ICD-10-CM | POA: Diagnosis not present

## 2020-05-12 DIAGNOSIS — R110 Nausea: Secondary | ICD-10-CM | POA: Diagnosis not present

## 2020-05-12 DIAGNOSIS — Z79899 Other long term (current) drug therapy: Secondary | ICD-10-CM | POA: Insufficient documentation

## 2020-05-12 LAB — CBC
HCT: 48.9 % (ref 39.0–52.0)
Hemoglobin: 16.2 g/dL (ref 13.0–17.0)
MCH: 30.6 pg (ref 26.0–34.0)
MCHC: 33.1 g/dL (ref 30.0–36.0)
MCV: 92.4 fL (ref 80.0–100.0)
Platelets: 278 10*3/uL (ref 150–400)
RBC: 5.29 MIL/uL (ref 4.22–5.81)
RDW: 12.7 % (ref 11.5–15.5)
WBC: 7 10*3/uL (ref 4.0–10.5)
nRBC: 0 % (ref 0.0–0.2)

## 2020-05-12 LAB — BASIC METABOLIC PANEL
Anion gap: 10 (ref 5–15)
BUN: 11 mg/dL (ref 6–20)
CO2: 27 mmol/L (ref 22–32)
Calcium: 9.4 mg/dL (ref 8.9–10.3)
Chloride: 104 mmol/L (ref 98–111)
Creatinine, Ser: 0.9 mg/dL (ref 0.61–1.24)
GFR, Estimated: >60 mL/min (ref >60)
Glucose, Bld: 110 mg/dL — ABNORMAL HIGH (ref 70–99)
Potassium: 4.2 mmol/L (ref 3.5–5.1)
Sodium: 141 mmol/L (ref 135–145)

## 2020-05-12 LAB — TROPONIN I (HIGH SENSITIVITY)
Troponin I (High Sensitivity): 5 ng/L (ref <18)
Troponin I (High Sensitivity): 5 ng/L (ref <18)

## 2020-05-12 LAB — D-DIMER, QUANTITATIVE: D-Dimer, Quant: 0.44 ug/mL-FEU (ref 0.00–0.50)

## 2020-05-12 NOTE — Discharge Instructions (Addendum)
Seen here for chest pain.  Lab work and imaging looks reassuring.  Recommend over-the-counter pain medications like ibuprofen or Tylenol every 6 hours as needed.  Recommend follow-up with your PCP for further evaluation management.  Come back to the emergency department if you develop chest pain, shortness of breath, severe abdominal pain, uncontrolled nausea, vomiting, diarrhea.

## 2020-05-12 NOTE — Telephone Encounter (Signed)
Signed              Patient is requesting to speak with [facility].    Copied from [place] [id number]. Topic: General - Inquiry >> May 11, 2020  3:55 PM Joshua [name] wrote: Reason for CRM: Pt called in wanting to seak with Geni Bers regarding a counselor . Please advise

## 2020-05-12 NOTE — ED Provider Notes (Signed)
Joshua [name] DEPT Provider Note   CSN: [id number] Arrival date & time: 05/12/20  1155     History Chief Complaint  Patient presents with  . Chest Pain    Joshua [name] is a 59 y.o. male.  HPI   Patient with significant medical history of meningioma and hypertension presents to the emergency department with chief complaint of chest pain.  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  He endorses that the pain stayed consistent for about 30 minutes and then went away on its own, he states he became diaphoretic and slightly nauseous but denies shortness of breath, headaches or dizziness.  He states he has never had this happen to him in the past and is concerned because this pain stopped him in his tracks.  He endorses that he has a stomach ulcer and chronic acid reflux but states this felt different from prior acid reflux attacks.  He denies any cardiac history, is not on hormone therapy, denies recent leg swelling, recent immobilization, denies any leg pain.  No history of DVT or PEs.  He denies history is of aortic aneurysms or Marfan syndrome.  States he is currently in no pain at this time.  Patient denies headache, fever, chills, shortness of breath, chest pain, abdominal pain, nausea, vomiting, diarrhea, pedal edema.  Past Medical History:  Diagnosis Date  . Brain tumor ([place]) 02/20/2013   brain tumor removed in March 2014, Dr Donald Pore  . Diffuse idiopathic skeletal hyperostosis 06/05/2013  . Dizziness   . Enlarged prostate   . Headache(784.0)    scattered  . Hypertension   . 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 Meningioma ([place])   . Meningioma, recurrent of brain ([place]) 01/31/2019  . Neuropathy 12/05/2019  . Seizures ([place])    11/27/19 last sz 1 wk ago  .  Stroke [facility])     Patient Active Problem List   Diagnosis Date Noted  . Neuropathy 12/05/2019  . 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 Seizures ([place]) 01/31/2019  . Meningioma, recurrent of brain ([place]) 01/31/2019  . Meningioma ([place]) 01/03/2019  . Brain tumor ([place]) 12/27/2018  . Diffuse idiopathic skeletal hyperostosis 06/05/2013    Past Surgical History:  Procedure Laterality Date  . APPLICATION OF CRANIAL NAVIGATION N/A 01/10/2019   Procedure: APPLICATION OF CRANIAL NAVIGATION;  Surgeon: Erline Levine, MD;  Location: [place];  Service: Neurosurgery;  Laterality: N/A;  . CATARACT EXTRACTION Right   . CRANIOTOMY Right 11/06/2012   Procedure: CRANIOTOMY TUMOR EXCISION;  Surgeon: Erline Levine, MD;  Location: [place] NEURO ORS;  Service: Neurosurgery;  Laterality: Right;  Right Parasagittal craniotomy for meningioma with Stealth  . CRANIOTOMY Right 01/10/2019   Procedure: Right Parasagittal Craniotomy for Tumor;  Surgeon: Erline Levine, MD;  Location: [place];  Service: Neurosurgery;  Laterality: Right;  Right parasagittal craniotomy for tumor  . ESOPHAGOGASTRODUODENOSCOPY    . right knee arthroscopy         Family History  Problem Relation Age of Onset  . Hypertension Mother   . Dementia Mother   . Diabetes Mother   . Hypertension Father   . CAD Father   . Diabetes Father   . CAD Brother     Social History   Tobacco Use  . Smoking  status: Former Smoker    Types: Cigarettes    Quit date: 01/29/2019    Years since quitting: 1.2  . Smokeless tobacco: Never Used  . Tobacco comment: hasn't smoked in a week  Vaping Use  . Vaping Use: Never used  Substance Use Topics  . Alcohol use: Yes  . Drug use: No    Home Medications Prior to Admission medications   Medication Sig Start Date End Date Taking? Authorizing Provider  amLODipine (NORVASC) 5 MG tablet Take 1 tablet (5 mg total) by mouth daily. To lower blood pressure 05/06/20   Fulp, Cammie, MD  aspirin EC 81  MG EC tablet Take 1 tablet (81 mg total) by mouth daily. 01/14/19   Erline Levine, MD  atorvastatin (LIPITOR) 20 MG tablet Take 1 tablet (20 mg total) by mouth daily. 04/30/20   Charlott Rakes, MD  clonazePAM (KLONOPIN) 0.5 MG tablet TAKE 1 TABLET(0.5 MG) BY MOUTH TWICE DAILY 05/05/20   Vaslow, Acey Lav, MD  FIBER ADULT GUMMIES PO Take 2 tablets by mouth daily.     [provider]  gabapentin (NEURONTIN) 300 MG capsule Take 1 capsule (300 mg total) by mouth 2 (two) times daily. 12/03/19   [name], MD  Lacosamide 100 MG TABS Take 2 tablets (200 mg total) by mouth in the morning and at bedtime. 05/10/20 06/09/20  Lavonia Drafts, MD  lamoTRIgine (LAMICTAL) 100 MG tablet TAKE 2 TABLETS (200 MG TOTAL) BY MOUTH 2 (TWO) TIMES DAILY. 04/23/20   Vaslow, Acey Lav, MD  lidocaine (LIDODERM) 5 % Place 1 patch onto the skin daily. Remove & Discard patch within 12 hours or as directed by MD 02/23/20   Recardo Evangelist, PA-C  LORazepam (ATIVAN) 1 MG tablet Take 1 tablet (1 mg total) by mouth every 8 (eight) hours as needed for seizure (for breakthrough seizures). 12/26/19   Vaslow, Acey Lav, MD  methocarbamol (ROBAXIN) 500 MG tablet Take 1 tablet (500 mg total) by mouth every 8 (eight) hours as needed for muscle spasms. 04/22/20   Davonna Belling, MD  mirtazapine (REMERON) 15 MG tablet Take 15 mg by mouth at bedtime as needed (Sleep).     [provider]  omeprazole (PRILOSEC) 20 MG capsule One pill twice daily- morning and before bedtime to reduce stomach acid 04/15/20   Fulp, Cammie, MD  ondansetron (ZOFRAN-ODT) 4 MG disintegrating tablet Take 1 tablet (4 mg total) by mouth every 8 (eight) hours as needed for nausea or vomiting. 02/04/20   Vaslow, Acey Lav, MD  sucralfate (CARAFATE) 1 g tablet Take 1 tablet (1 g total) by mouth 4 (four) times daily -  with meals and at bedtime. 05/06/20   Fulp, Cammie, MD  VIMPAT 100 MG TABS TAKE 2 TABLETS IN MORNING AND 2 AT BEDTIME 05/11/20   Vaslow,  Acey Lav, MD  vitamin C (ASCORBIC ACID) 500 MG tablet Take 500 mg by mouth daily.     [provider]    Allergies    Patient has no known allergies.  Review of Systems   Review of Systems  Constitutional: Negative for chills and fever.  HENT: Negative for congestion, tinnitus, trouble swallowing and voice change.   Eyes: Negative for visual disturbance.  Respiratory: Negative for shortness of breath.   Cardiovascular: Positive for chest pain. Negative for palpitations.  Gastrointestinal: Positive for nausea. Negative for abdominal pain, diarrhea and vomiting.  Genitourinary: Negative for enuresis, flank pain, frequency and genital sores.  Musculoskeletal: Negative for back pain.  Skin: Negative for rash.  Neurological: Negative for dizziness and headaches.  Hematological: Does not bruise/bleed easily.    Physical Exam Updated Vital Signs BP 125/81   Pulse 61   Temp 97.9 F (36.6 C) (Oral)   Resp 18   SpO2 98%   Physical Exam Vitals and nursing note reviewed.  Constitutional:      General: He is not in acute distress.    Appearance: He is not ill-appearing.  HENT:     Head: Normocephalic and atraumatic.     Nose: No congestion.     Mouth/Throat:     Mouth: Mucous membranes are moist.     Pharynx: Oropharynx is clear.  Eyes:     General: No scleral icterus. Cardiovascular:     Rate and Rhythm: Normal rate and regular rhythm.     Pulses: Normal pulses.     Heart sounds: No murmur heard.  No friction rub. No gallop.   Pulmonary:     Effort: No respiratory distress.     Breath sounds: No wheezing, rhonchi or rales.  Abdominal:     General: There is no distension.     Palpations: Abdomen is soft.     Tenderness: There is no abdominal tenderness. There is no right CVA tenderness, left CVA tenderness or guarding.  Musculoskeletal:        General: No swelling.     Right lower leg: No edema.     Left lower leg: No edema.  Skin:    General: Skin is warm  and dry.     Capillary Refill: Capillary refill takes less than 2 seconds.     Findings: No rash.  Neurological:     Mental Status: He is alert.  Psychiatric:        Mood and Affect: Mood normal.     ED Results / Procedures / Treatments   Labs (all labs ordered are listed, but only abnormal results are displayed) Labs Reviewed  BASIC METABOLIC PANEL - Abnormal; Notable for the following components:      Result Value   Glucose, Bld 110 (*)    All other components within normal limits  CBC  D-DIMER, QUANTITATIVE (NOT AT [facility])  TROPONIN I (HIGH SENSITIVITY)  TROPONIN I (HIGH SENSITIVITY)    EKG EKG Interpretation  Date/Time:  Tuesday May 12 2020 12:01:43 EDT Ventricular Rate:  61 PR Interval:    QRS Duration: 100 QT Interval:  394 QTC Calculation: 397 R Axis:   122 Text Interpretation: Sinus rhythm Right axis deviation 12 Lead; [place] overall similar to Apr 17 2020 Confirmed by Sherwood Gambler [id number]) on 05/12/2020 12:22:44 PM   Radiology DG Chest 2 View  Result Date: 05/12/2020 CLINICAL DATA:  Chest pain EXAM: CHEST - 2 VIEW COMPARISON:  None. FINDINGS: The heart size and mediastinal contours are within normal limits. Both lungs are clear. No pleural effusion or pneumothorax. The visualized skeletal structures are unremarkable. IMPRESSION: No acute process in the chest. Electronically Signed   By: Macy Mis M.D.   On: 05/12/2020 12:35    Procedures Procedures (including critical care time)  Medications Ordered in ED Medications - No data to display  ED Course  I have reviewed the triage vital signs and the nursing notes.  Pertinent labs & imaging results that were available during my care of the patient were reviewed by me and considered in my medical decision making (see chart for details).    MDM Rules/Calculators/A&P  I have personally reviewed all imaging, labs and have interpreted them.  Patient presents with chest  pain.  He was alert, did not appear acute distress, vital signs reassuring.  Will order screening labs, chest x-ray, EKG and reevaluate.  CBC negative for leukocytosis or signs of anemia.  BMP negative for electrolyte abnormalities, no metabolic acidosis, hyperglycemia of 110, no AKI, no anion gap noted.  D-dimer was 0.44, negative delta troponin chest x-ray does not reveal any acute abnormalities.  EKG sinus rhythm without signs of ischemia no ST elevation depression noted.  I have low suspicion for ACS or cardiac abnormality as patient denies current chest pain, shortness of breath, history would be atypical as he complains of chest pain for 30 seconds which revealed on its own.  Patient had a negative delta troponin, EKG sinus without signs of ischemia.  Low suspicion for aortic dissection or AAA.  Low suspicion for PE as patient has a negative D-dimer.  Low suspicion for systemic infection as patient is nontoxic-appearing, vital signs reassuring, no obvious source infection noted on exam.  I suspect patient's pain may be due to acid reflux but differential diagnosis includes costochondritis, anxiety, muscle strain.  Will recommend he follows up with PCP for further evaluation management.  Vital signs have remained stable, no indication for hospital admission.  Patient discussed with attending and they agreed with assessment and plan.  Patient given at home care as well strict return precautions.  Patient verbalized that they understood agreed to said plan.   Final Clinical Impression(s) / ED Diagnoses Final diagnoses:  Atypical chest pain    Rx / DC Orders ED Discharge Orders    None       Marcello Fennel, PA-C 05/12/20 1547    Sherwood Gambler, MD 05/12/20 [phone]

## 2020-05-12 NOTE — Telephone Encounter (Signed)
Patient is requesting to speak with [facility].    Copied from [place] [id number]. Topic: General - Inquiry >> May 11, 2020  3:55 PM Alease Frame wrote: Reason for CRM: Pt called in wanting to seak with Geni Bers regarding a counselor . Please advise

## 2020-05-12 NOTE — ED Triage Notes (Signed)
Pt reports left sided chest pain that started about 20 minutes before arriving. Pt also endorses some mild SHOB, dizziness, and nausea. Pt reports the chest pain has eased off some but is not radiating to shoulder blade.

## 2020-05-13 DIAGNOSIS — K529 Noninfective gastroenteritis and colitis, unspecified: Secondary | ICD-10-CM | POA: Diagnosis not present

## 2020-05-15 NOTE — Telephone Encounter (Signed)
Follow up call placed to patient. Pt shared that he is doing "pretty good". He reports interest in establishing therapy and medication management in the community. LCSW discussed options that accepts his insurance, in addition, to sending supportive resources via e-mail, per pt request. He agreed to follow up on resources. LCSW encouraged patient to contact her with any additional behavioral health and/or resource needs. Pt was appreciative for assistance, no additional concerns noted.

## 2020-05-18 ENCOUNTER — Telehealth: Payer: Self-pay | Admitting: Family Medicine

## 2020-05-18 NOTE — Telephone Encounter (Signed)
Copied from [place] [phone]. Topic: General - Other >> May 18, 2020 11:57 AM Celene Kras wrote: Reason for CRM: Pt calling stating that he has an appt with opthalmologist on 05/20/20. He states that he is not sure who he is supposed to see. Please advise.

## 2020-05-21 ENCOUNTER — Ambulatory Visit: Payer: Medicaid Other | Attending: Internal Medicine

## 2020-05-21 DIAGNOSIS — Z23 Encounter for immunization: Secondary | ICD-10-CM

## 2020-05-21 NOTE — Progress Notes (Signed)
   Covid-19 Vaccination Clinic  Name:  Joshua [name]    MRN: [id number] DOB: Nov 04, 1960  05/21/2020  Mr. Joshua [name] was observed post Covid-19 immunization for 15 minutes without incident. He was provided with Vaccine Information Sheet and instruction to access the V-Safe system.   Mr. Joshua [name] was instructed to call 911 with any severe reactions post vaccine: Marland Kitchen Difficulty breathing  . Swelling of face and throat  . A fast heartbeat  . A bad rash all over body  . Dizziness and weakness

## 2020-05-22 NOTE — Telephone Encounter (Signed)
On 05/19/20 I spoke to him he don't know where is his appointment and he is going to call [facility] because I told him if the appointment is outside [place] is not in the system unless I schedule the appointment .

## 2020-05-25 ENCOUNTER — Telehealth: Payer: Self-pay | Admitting: *Deleted

## 2020-05-25 ENCOUNTER — Telehealth: Payer: Self-pay | Admitting: Family Medicine

## 2020-05-25 ENCOUNTER — Ambulatory Visit (HOSPITAL_COMMUNITY)
Admission: AD | Admit: 2020-05-25 | Discharge: 2020-05-25 | Disposition: A | Discharge status: Home / Self Care | Payer: Medicaid Other | Attending: Psychiatry | Admitting: Psychiatry

## 2020-05-25 DIAGNOSIS — R569 Unspecified convulsions: Secondary | ICD-10-CM | POA: Insufficient documentation

## 2020-05-25 DIAGNOSIS — R454 Irritability and anger: Secondary | ICD-10-CM | POA: Insufficient documentation

## 2020-05-25 DIAGNOSIS — R4582 Worries: Secondary | ICD-10-CM | POA: Insufficient documentation

## 2020-05-25 DIAGNOSIS — Z79899 Other long term (current) drug therapy: Secondary | ICD-10-CM | POA: Insufficient documentation

## 2020-05-25 DIAGNOSIS — F32A Depression, unspecified: Secondary | ICD-10-CM | POA: Diagnosis not present

## 2020-05-25 DIAGNOSIS — R450 Nervousness: Secondary | ICD-10-CM | POA: Insufficient documentation

## 2020-05-25 NOTE — BH Assessment (Addendum)
Assessment Note  Joshua [name] is an 59 y.o. male. He presents with a complaint of increased anxiety, irritability, and anger. States that he see's a neurologist (Dr. Tereso Newcomer). His provider prescribed him medications to assist with his neurological symptoms summer 2020. Their has since been changes with his medications such as lowered doses due to complaints of anxiety. Despite those changes, patient feels that he is on the edge and has very little patience. He describes his anxiety as anticipatory anxiety especially when it pertains to his health. States, "Anything that I am confronted with I think the worse". He has to wait until March 2022 to get new surgery results. States that he is worried about what those results will be and doesn't want to wait. Additional stressors: separation from spouse, living with a friend temporarily, and no steady income.   Patient denies SI. No history of suicide attempts, gestures, and/or self harm. Denies access to means such as firearms. No self mutilating behaviors. Depressive symptoms reported today include:  Feeling angry/irritable, Feeling worthless/self pity, Loss of interest in usual pleasures, Fatigue, Guilt, Isolating, Tearfulness. Anxiety is severe and he has frequent panic attacks. Appetite is good. His sleep is poor. States that he wakes up frequently during the night at 3am or 4am. Denies history of trauma and/or abuse. Denies HI. No history of violent and/or aggressive behaviors. Denies AVH's. Reports current use of alcohol on the weekends. Drinks 1-2 beers. However, was told by his neurologist that he shouldn't be drinking while he is taking RX's. Patient states that he may attend AA meetings for support in managing his drinking. He does not have a therapist and/or psychiatrist.   Patient is alert and oriented x4. Speech is normal. Affect is depressed and sad.  Mood is congruent with affect. Behavior is calm and cooperative; anxious. Memory is recent and  remote intact. Thought processes are appropriate. Patient does not appear to be responding to internal stimuli.   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 Brain tumor ([place]) 02/20/2013   brain tumor removed in March 2014, Dr Donald Pore  . Diffuse idiopathic skeletal hyperostosis 06/05/2013  . Dizziness   . Enlarged prostate   . Headache(784.0)    scattered  . Hypertension   . 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 Meningioma ([place])   . Meningioma, recurrent of brain ([place]) 01/31/2019  . Neuropathy 12/05/2019  . Seizures ([place])    11/27/19 last sz 1 wk ago  . Stroke [facility])     Past Surgical History:  Procedure Laterality Date  . APPLICATION OF CRANIAL NAVIGATION N/A 01/10/2019   Procedure: APPLICATION OF CRANIAL NAVIGATION;  Surgeon: Erline Levine, MD;  Location: [place];  Service: Neurosurgery;  Laterality: N/A;  . CATARACT EXTRACTION Right   . CRANIOTOMY Right 11/06/2012   Procedure: CRANIOTOMY TUMOR EXCISION;  Surgeon: Erline Levine, MD;  Location: [place] NEURO ORS;  Service: Neurosurgery;  Laterality: Right;  Right Parasagittal craniotomy for meningioma with Stealth  . CRANIOTOMY Right 01/10/2019   Procedure: Right Parasagittal Craniotomy for Tumor;  Surgeon: Erline Levine, MD;  Location: [place];  Service: Neurosurgery;  Laterality: Right;  Right parasagittal craniotomy for tumor  . ESOPHAGOGASTRODUODENOSCOPY    . right knee arthroscopy      Family History:  Family History  Problem Relation Age of Onset  . Hypertension Mother   . Dementia Mother   . Diabetes  Mother   . Hypertension Father   . CAD Father   . Diabetes Father   . CAD Brother     Social History:  reports that he quit smoking about 15 months ago. His smoking use included cigarettes. He has never used smokeless tobacco. He reports current alcohol use. He reports that he does  not use drugs.  Additional Social History:  Alcohol / Drug Use Pain Medications: SEE MAR Prescriptions: SEE MAR Over the Counter: SEE MAR History of alcohol / drug use?: Yes Substance #1 Name of Substance 1: Alcohol 1 - Age of First Use: unk 1 - Amount (size/oz): 1-2 drinks on the weekend 1 - Frequency: weekend use 1 - Duration: on-going 1 - Last Use / Amount: 1 month ago  CIWA: CIWA-Ar BP: (!) 155/75 Pulse Rate: 70 COWS:    Allergies: No Known Allergies  Home Medications: (Not in a hospital admission)   OB/GYN Status:  No LMP for male patient.  General Assessment Data Location of Assessment:  ([place] walk in ) TTS Assessment: In system Is this a Tele or Face-to-Face Assessment?: Face-to-Face Is this an Initial Assessment or a Re-assessment for this encounter?: Initial Assessment Patient Accompanied by::  (self referral ) Language Other than English: No Living Arrangements:  (with a friend ) What gender do you identify as?: Male Date Telepsych consult ordered in [facility]:  (05/25/2020) Time Telepsych consult ordered in [facility]:  (n/a) Marital status: Divorced [place] name:  (n/a) Pregnancy Status: No Living Arrangements: Non-relatives/Friends Can pt return to current living arrangement?: Yes Admission Status: Voluntary Is patient capable of signing voluntary admission?: Yes Referral Source: Self/Family/Friend Insurance type:  (Medicaid )  Medical Screening Exam ([place]) Medical Exam completed: Yes Harriett Sine, NP)  [place] Arrangements: Non-relatives/Friends Legal Guardian:  (no legal guardian ) Name of Psychiatrist:  (no psychiatrist ) Name of Therapist:  (no therapist )  Education Status Is patient currently in school?: No Is the patient employed, unemployed or receiving disability?: Unemployed  Risk to self with the past 6 months Suicidal Ideation: No Has patient been a risk to self within the past 6 months prior to admission? :  No Suicidal Intent: No Has patient had any suicidal intent within the past 6 months prior to admission? : No Is patient at risk for suicide?: No Suicidal Plan?: No Has patient had any suicidal plan within the past 6 months prior to admission? : No Access to Means: No What has been your use of drugs/alcohol within the last 12 months?:  (n/a) Previous Attempts/Gestures: No How many times?:  (n/a) Other Self Harm Risks:  (n/a) Triggers for Past Attempts:  (no triggers for past attempts and/or attempts ) Intentional Self Injurious Behavior: None Family Suicide History: No Recent stressful life event(s): Financial Problems (health/medical and doesn't have stable housing ) Persecutory voices/beliefs?: No Depression: Yes Depression Symptoms: Feeling angry/irritable, Feeling worthless/self pity, Loss of interest in usual pleasures, Fatigue, Guilt, Isolating, Tearfulness Substance abuse history and/or treatment for substance abuse?: No Suicide prevention information given to non-admitted patients: Not applicable  Risk to Others within the past 6 months Homicidal Ideation: No Does patient have any lifetime risk of violence toward others beyond the six months prior to admission? : No Thoughts of Harm to Others: No Current Homicidal Intent: No Current Homicidal Plan: No Access to Homicidal Means: No Identified Victim:  (n/a) History of harm to others?: No Assessment of Violence: None Noted Violent Behavior Description:  (patient is calm and  cooperative ) Does patient have access to weapons?: No Criminal Charges Pending?: No Does patient have a court date: No Is patient on probation?: No  Psychosis Hallucinations: None noted Delusions: None noted  Mental Status Report Appearance/Hygiene: Unremarkable Eye Contact: Good Motor Activity: Freedom of movement Speech: Logical/coherent Level of Consciousness: Alert Mood: Depressed, Anxious, Sad Affect: Sad, Anxious Anxiety Level: Panic  Attacks Panic attack frequency:  (Every other week ) Most recent panic attack:  (un ) Thought Processes: Relevant Judgement: Impaired Orientation: Person, Time, Situation, Place Obsessive Compulsive Thoughts/Behaviors: None  Cognitive Functioning Concentration: Decreased Memory: Recent Intact, Remote Intact Is patient IDD: No Insight: Poor Impulse Control: Poor Appetite: Good Have you had any weight changes? : No Change Sleep: Decreased Total Hours of Sleep:  (wakes up every morning at 3am-4am) Vegetative Symptoms: None  ADLScreening [facility] Assessment Services) Patient's cognitive ability adequate to safely complete daily activities?: Yes Patient able to express need for assistance with ADLs?: Yes Independently performs ADLs?: Yes (appropriate for developmental age)  Prior Inpatient Therapy Prior Inpatient Therapy: No  Prior Outpatient Therapy Prior Outpatient Therapy: No Does patient have an ACCT team?: No Does patient have Intensive In-House Services?  : No Does patient have [facility] services? : No Does patient have P4CC services?: No  ADL Screening (condition at time of admission) Patient's cognitive ability adequate to safely complete daily activities?: Yes Patient able to express need for assistance with ADLs?: Yes Independently performs ADLs?: Yes (appropriate for developmental age)  Home Assistive Devices/Equipment Home Assistive Devices/Equipment: Cane (specify quad or straight)    Abuse/Neglect Assessment (Assessment to be complete while patient is alone) Physical Abuse: Denies Verbal Abuse: Denies Sexual Abuse: Denies Exploitation of patient/patient's resources: Denies Self-Neglect: Denies                Disposition: Based on my evaluation by this clinician and Harriett Sine, NP,  the patient does not appear to have an emergency medical condition.  We discussed importance of consulting neurologist regarding possible mental health side effects of  seizure medication. Outpatient referrals for counseling provided (individual therapist and group therapy). Disposition Initial Assessment Completed for this Encounter: Yes Disposition of Patient: Discharge (Per Harriett Sine, NP, patient is psych cleared. ) Patient referred to: Outpatient clinic referral [facility]- individual therapy or group therapy )  On Site Evaluation by:   Reviewed with Physician:    Waldon Merl 05/25/2020 5:05 PM

## 2020-05-25 NOTE — Telephone Encounter (Signed)
Please call patient and find out what issues he is having with his leg.  Is this something that he has already seen his orthopedic doctor about.  Patient with additional message that his neurologist was going to handle patient's issues with medication for anxiety.

## 2020-05-25 NOTE — Telephone Encounter (Signed)
He also reports that he is talking to someone over at [facility] here in [place] (counselor?)  He will call back to advise of exact details.  States home stressors aren't as bad as they were in the past so that is why he thinks it might be related to the medication.

## 2020-05-25 NOTE — Telephone Encounter (Signed)
Copied from [place] [phone]. Topic: General - Other >> May 25, 2020  9:14 AM Keene Breath wrote: Reason for CRM: Patient called to ask if he could get some medication for his anxiety.  He stated that his wife is having surgery tomorrow and he is under stress.  Patient would like the nurse to call him to discuss at [phone] >> May 25, 2020 11:22 AM Keene Breath wrote: Patient is calling back regarding the anxiety he had called about earlier.  Patient stated that his neurologist will have to manage the medication and he is scheduled for an appt. Today.  Please disregard previous message.   Patient also mentioned that he has not heard anything regarding a referral for a specialist for his leg.  Please call patient to discuss.

## 2020-05-25 NOTE — Telephone Encounter (Signed)
Patient called to report concern for side effect of increasing anger.  He reports this is not typically a concern for him until he started these medications.  He wanted to see if he could have a visit to review if any changes could be made if it was related to his current medication regimen.    Routed to MD.

## 2020-05-25 NOTE — H&P (Signed)
Behavioral Health Medical Screening Exam  Joshua [name] is an 59 y.o. male. He states that he is concerned that medications prescribed by his neurologist have him "on edge" and "angry." He reports feeling impatient and anxious. He states he is working on getting an appointment with his neurologist to discuss possible medication side effects. He denies any SI/HI/AVH. He does report several symptoms of depression- isolation, guilt, insomnia, feelings of worthlessness. He is requesting referrals for outpatient counseling.  Total Time spent with patient: 15 minutes  Psychiatric Specialty Exam: Physical Exam Vitals reviewed.  Constitutional:      Appearance: He is well-developed.  Cardiovascular:     Rate and Rhythm: Normal rate.  Pulmonary:     Effort: Pulmonary effort is normal.  Neurological:     Mental Status: He is alert and oriented to person, place, and time.    Review of Systems  Constitutional: Negative.   Respiratory: Negative for cough and shortness of breath.   Psychiatric/Behavioral: Positive for dysphoric mood and sleep disturbance. Negative for agitation, behavioral problems, confusion, decreased concentration, hallucinations, self-injury and suicidal ideas. The patient is nervous/anxious. The patient is not hyperactive.    Blood pressure (!) 155/75, pulse 70, temperature 98.7 F (37.1 C), temperature source Oral, resp. rate 18, SpO2 99 %.There is no height or weight on file to calculate BMI. General Appearance: Casual Eye Contact:  Good Speech:  Normal Rate Volume:  Normal Mood:  Anxious Affect:  Congruent Thought Process:  Coherent and Goal Directed Orientation:  Full (Time, Place, and Person) Thought Content:  Logical Suicidal Thoughts:  No Homicidal Thoughts:  No Memory:  Immediate;   Fair Recent;   Fair Remote;   Fair Judgement:  Intact Insight:  Fair Psychomotor Activity:  Normal Concentration: Concentration: Fair and Attention Span: Fair Recall:   [employer] of Knowledge:Fair Language: Good Akathisia:  No Handed:  Right AIMS (if indicated):    Assets:  Communication Skills Desire for Improvement Housing Social Support Sleep:     Musculoskeletal: Strength & Muscle Tone: decreased Gait & Station: ambulates with cane Patient leans: N/A  Blood pressure (!) 155/75, pulse 70, temperature 98.7 F (37.1 C), temperature source Oral, resp. rate 18, SpO2 99 %.  Recommendations: Based on my evaluation the patient does not appear to have an emergency medical condition.  We discussed importance of consulting neurologist regarding possible mental health side effects of seizure medication. Outpatient referrals for counseling.  Connye Burkitt, NP 05/25/2020, 2:30 PM

## 2020-05-25 NOTE — Telephone Encounter (Signed)
Copied from [place] [phone]. Topic: General - Other >> May 25, 2020  9:14 AM Keene Breath wrote: Reason for CRM: Patient called to ask if he could get some medication for his anxiety.  He stated that his wife is having surgery tomorrow and he is under stress.  Patient would like the nurse to call him to discuss at [phone]

## 2020-05-28 ENCOUNTER — Inpatient Hospital Stay: Payer: Medicaid Other | Attending: Internal Medicine | Admitting: Internal Medicine

## 2020-05-28 DIAGNOSIS — R569 Unspecified convulsions: Secondary | ICD-10-CM

## 2020-05-28 DIAGNOSIS — C700 Malignant neoplasm of cerebral meninges: Secondary | ICD-10-CM

## 2020-05-28 NOTE — Progress Notes (Signed)
I connected with Joshua [name] on 05/28/20 at  9:00 AM EST by telephone visit and verified that I am speaking with the correct person using two identifiers.  I discussed the limitations, risks, security and privacy concerns of performing an evaluation and management service by telemedicine and the availability of in-person appointments. I also discussed with the patient that there may be a patient responsible charge related to this service. The patient expressed understanding and agreed to proceed.  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  He describes frustration, easily falling into mood swings and even anger.  He has not been a harm to himself or others.  This has been ongoing over past couple of years, but has worsened lately in relation to social struggles, divorce, threatened homelessness, etc.  Fortunately he has been set up with a counselor and has upcoming appointment.  No further seizures, no new or progressive focal deficits. Observations: Language and cognition at baseline  Assessment and Plan: 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)  Seizures ([place])  Follow Up Instructions: No changes to AED regimen or other medication recommended at this time.  Continue to address behavioral modification through counseling, management of psychosocial stressors.    RTC as scheduled.  I discussed the assessment and treatment plan with the patient.  The patient was provided an opportunity to ask questions and all were answered.  The patient agreed with the plan and demonstrated understanding of the instructions.    The patient was advised to call back or seek an in-person evaluation if the symptoms worsen or if  the condition fails to improve as anticipated.  I provided 5-10 minutes of non-face-to-face time during this enocunter.  [name], MD   I provided 15 minutes of non face-to-face telephone visit time during this encounter, and > 50% was spent counseling as documented under my assessment & plan.

## 2020-05-29 ENCOUNTER — Other Ambulatory Visit: Payer: Self-pay

## 2020-06-01 ENCOUNTER — Other Ambulatory Visit: Payer: Self-pay | Admitting: Family Medicine

## 2020-06-01 ENCOUNTER — Telehealth: Payer: Self-pay | Admitting: Family Medicine

## 2020-06-01 ENCOUNTER — Encounter (HOSPITAL_BASED_OUTPATIENT_CLINIC_OR_DEPARTMENT_OTHER): Payer: Self-pay | Admitting: Orthopedic Surgery

## 2020-06-01 ENCOUNTER — Other Ambulatory Visit: Payer: Self-pay

## 2020-06-01 DIAGNOSIS — R110 Nausea: Secondary | ICD-10-CM

## 2020-06-01 MED ORDER — ONDANSETRON HCL 4 MG PO TABS: 4.0000 mg | ORAL_TABLET | Freq: Three times a day (TID) | ORAL | 0 refills | Status: DC | PRN

## 2020-06-01 NOTE — Telephone Encounter (Signed)
I will send a RX for zofran to help with nausea. He was taking a medication prescribed for his seizures by his neurologist that also is prescribed for anxiety- not sure if he is still on this medication

## 2020-06-01 NOTE — Telephone Encounter (Signed)
Copied from [place] [phone]. Topic: General - Other >> Jun 01, 2020  1:55 PM Leward Quan A wrote: Reason for CRM: Patient called in to inquire if Dr Chapman Fitch can prescribe him medication that was once prescribed for nausea and anxiety. He could not remember the name of the medication. Please advise  Ph# [phone]

## 2020-06-01 NOTE — Progress Notes (Signed)
Patient ID: Joshua [name], male   DOB: 26-Mar-1961, 59 y.o.   MRN: [id number]   Patient left voicemail message requesting medication to help with anxiety and nausea.  Prescription will be sent to patient's pharmacy for Zofran to help with nausea.  Patient has been on Ativan for seizure disorders per neurology and this medication should also help with anxiety and it also appears that someone prescribed Klonopin for patient in the past.

## 2020-06-05 ENCOUNTER — Other Ambulatory Visit (HOSPITAL_COMMUNITY): Payer: Medicaid Other

## 2020-06-05 ENCOUNTER — Other Ambulatory Visit: Payer: Self-pay | Admitting: Physician Assistant

## 2020-06-05 DIAGNOSIS — H5201 Hypermetropia, right eye: Secondary | ICD-10-CM | POA: Diagnosis not present

## 2020-06-05 DIAGNOSIS — H52223 Regular astigmatism, bilateral: Secondary | ICD-10-CM | POA: Diagnosis not present

## 2020-06-08 ENCOUNTER — Other Ambulatory Visit (HOSPITAL_COMMUNITY)
Admission: RE | Admit: 2020-06-08 | Discharge: 2020-06-08 | Disposition: A | Discharge status: Home / Self Care | Payer: Medicaid Other | Source: Ambulatory Visit | Attending: Orthopedic Surgery | Admitting: Orthopedic Surgery

## 2020-06-08 DIAGNOSIS — Z01812 Encounter for preprocedural laboratory examination: Secondary | ICD-10-CM | POA: Diagnosis not present

## 2020-06-08 DIAGNOSIS — Z20822 Contact with and (suspected) exposure to covid-19: Secondary | ICD-10-CM | POA: Insufficient documentation

## 2020-06-08 LAB — SARS CORONAVIRUS 2 (TAT 6-24 HRS): SARS Coronavirus 2: NEGATIVE

## 2020-06-08 NOTE — Progress Notes (Addendum)
Left message on voice mail for patient to get Covid screening done today before 3 pm. Contacted Dr. Jess Barters office. Patient called back and states he will go for Covid screen today. Address confirmed.

## 2020-06-09 ENCOUNTER — Ambulatory Visit (HOSPITAL_BASED_OUTPATIENT_CLINIC_OR_DEPARTMENT_OTHER): Payer: Medicaid Other | Admitting: Certified Registered"

## 2020-06-09 ENCOUNTER — Encounter (HOSPITAL_BASED_OUTPATIENT_CLINIC_OR_DEPARTMENT_OTHER): Payer: Self-pay | Admitting: Orthopedic Surgery

## 2020-06-09 ENCOUNTER — Other Ambulatory Visit: Payer: Self-pay

## 2020-06-09 ENCOUNTER — Ambulatory Visit: Payer: Self-pay

## 2020-06-09 ENCOUNTER — Encounter (HOSPITAL_BASED_OUTPATIENT_CLINIC_OR_DEPARTMENT_OTHER)
Admission: RE | Disposition: A | Discharge status: Home / Self Care | Payer: Self-pay | Source: Ambulatory Visit | Attending: Orthopedic Surgery

## 2020-06-09 ENCOUNTER — Telehealth: Payer: Self-pay | Admitting: Orthopedic Surgery

## 2020-06-09 ENCOUNTER — Emergency Department
Admission: EM | Admit: 2020-06-09 | Discharge: 2020-06-09 | Disposition: A | Discharge status: Home / Self Care | Payer: Medicaid Other | Attending: Emergency Medicine | Admitting: Emergency Medicine

## 2020-06-09 ENCOUNTER — Ambulatory Visit (HOSPITAL_BASED_OUTPATIENT_CLINIC_OR_DEPARTMENT_OTHER)
Admission: RE | Admit: 2020-06-09 | Discharge: 2020-06-09 | Disposition: A | Discharge status: Home / Self Care | Payer: Medicaid Other | Source: Ambulatory Visit | Attending: Orthopedic Surgery | Admitting: Orthopedic Surgery

## 2020-06-09 DIAGNOSIS — Z79899 Other long term (current) drug therapy: Secondary | ICD-10-CM | POA: Insufficient documentation

## 2020-06-09 DIAGNOSIS — I10 Essential (primary) hypertension: Secondary | ICD-10-CM | POA: Insufficient documentation

## 2020-06-09 DIAGNOSIS — M75122 Complete rotator cuff tear or rupture of left shoulder, not specified as traumatic: Secondary | ICD-10-CM

## 2020-06-09 DIAGNOSIS — S46211A Strain of muscle, fascia and tendon of other parts of biceps, right arm, initial encounter: Secondary | ICD-10-CM | POA: Diagnosis not present

## 2020-06-09 DIAGNOSIS — M75121 Complete rotator cuff tear or rupture of right shoulder, not specified as traumatic: Secondary | ICD-10-CM | POA: Diagnosis not present

## 2020-06-09 DIAGNOSIS — K219 Gastro-esophageal reflux disease without esophagitis: Secondary | ICD-10-CM | POA: Diagnosis not present

## 2020-06-09 DIAGNOSIS — F17210 Nicotine dependence, cigarettes, uncomplicated: Secondary | ICD-10-CM | POA: Insufficient documentation

## 2020-06-09 DIAGNOSIS — Z7982 Long term (current) use of aspirin: Secondary | ICD-10-CM | POA: Insufficient documentation

## 2020-06-09 DIAGNOSIS — Z9889 Other specified postprocedural states: Secondary | ICD-10-CM

## 2020-06-09 DIAGNOSIS — W19XXXA Unspecified fall, initial encounter: Secondary | ICD-10-CM | POA: Insufficient documentation

## 2020-06-09 DIAGNOSIS — S46011A Strain of muscle(s) and tendon(s) of the rotator cuff of right shoulder, initial encounter: Secondary | ICD-10-CM | POA: Insufficient documentation

## 2020-06-09 DIAGNOSIS — Z4731 Aftercare following explantation of shoulder joint prosthesis: Secondary | ICD-10-CM | POA: Insufficient documentation

## 2020-06-09 DIAGNOSIS — D329 Benign neoplasm of meninges, unspecified: Secondary | ICD-10-CM | POA: Diagnosis not present

## 2020-06-09 DIAGNOSIS — M75101 Unspecified rotator cuff tear or rupture of right shoulder, not specified as traumatic: Secondary | ICD-10-CM | POA: Diagnosis not present

## 2020-06-09 DIAGNOSIS — R202 Paresthesia of skin: Secondary | ICD-10-CM | POA: Diagnosis not present

## 2020-06-09 DIAGNOSIS — M7541 Impingement syndrome of right shoulder: Secondary | ICD-10-CM

## 2020-06-09 DIAGNOSIS — Z48817 Encounter for surgical aftercare following surgery on the skin and subcutaneous tissue: Secondary | ICD-10-CM | POA: Diagnosis not present

## 2020-06-09 HISTORY — DX: Anxiety disorder, unspecified: F41.9

## 2020-06-09 HISTORY — PX: SHOULDER ARTHROSCOPY: SHX128

## 2020-06-09 HISTORY — DX: Gastro-esophageal reflux disease without esophagitis: K21.9

## 2020-06-09 HISTORY — DX: Unspecified rotator cuff tear or rupture of unspecified shoulder, not specified as traumatic: M75.100

## 2020-06-09 SURGERY — ARTHROSCOPY, SHOULDER
Anesthesia: General | Site: Shoulder | Laterality: Right

## 2020-06-09 MED ORDER — LACTATED RINGERS IV SOLN: INTRAVENOUS | Status: DC

## 2020-06-09 MED ORDER — PHENYLEPHRINE HCL (PRESSORS) 10 MG/ML IV SOLN
INTRAVENOUS | Status: AC
  Filled 2020-06-09: qty 1

## 2020-06-09 MED ORDER — ONDANSETRON HCL 4 MG/2ML IJ SOLN
INTRAMUSCULAR | Status: DC | PRN
  Administered 2020-06-09: 4 mg via INTRAVENOUS

## 2020-06-09 MED ORDER — ONDANSETRON HCL 4 MG/2ML IJ SOLN
INTRAMUSCULAR | Status: AC
  Filled 2020-06-09: qty 2

## 2020-06-09 MED ORDER — HYDROMORPHONE HCL 1 MG/ML IJ SOLN
INTRAMUSCULAR | Status: AC
  Filled 2020-06-09: qty 0.5

## 2020-06-09 MED ORDER — OXYCODONE-ACETAMINOPHEN 5-325 MG PO TABS: 1.0000 | ORAL_TABLET | ORAL | 0 refills | Status: DC | PRN

## 2020-06-09 MED ORDER — DEXAMETHASONE SODIUM PHOSPHATE 10 MG/ML IJ SOLN
INTRAMUSCULAR | Status: AC
  Filled 2020-06-09: qty 1

## 2020-06-09 MED ORDER — OXYCODONE HCL 5 MG PO TABS
5.0000 mg | ORAL_TABLET | Freq: Once | ORAL | Status: AC
  Administered 2020-06-09: 5 mg via ORAL

## 2020-06-09 MED ORDER — SUGAMMADEX SODIUM 200 MG/2ML IV SOLN
INTRAVENOUS | Status: DC | PRN
  Administered 2020-06-09: 200 mg via INTRAVENOUS

## 2020-06-09 MED ORDER — FENTANYL CITRATE (PF) 100 MCG/2ML IJ SOLN
INTRAMUSCULAR | Status: AC
  Filled 2020-06-09: qty 2

## 2020-06-09 MED ORDER — SODIUM CHLORIDE 0.9 % IV SOLN
INTRAVENOUS | Status: DC | PRN
  Administered 2020-06-09: 25 ug/min via INTRAVENOUS

## 2020-06-09 MED ORDER — HYDROMORPHONE HCL 1 MG/ML IJ SOLN
0.2500 mg | INTRAMUSCULAR | Status: DC | PRN
  Administered 2020-06-09: 0.5 mg via INTRAVENOUS
  Administered 2020-06-09: 0.25 mg via INTRAVENOUS
  Administered 2020-06-09: 0.5 mg via INTRAVENOUS

## 2020-06-09 MED ORDER — CEFAZOLIN SODIUM-DEXTROSE 2-4 GM/100ML-% IV SOLN
2.0000 g | INTRAVENOUS | Status: AC
  Administered 2020-06-09: 2 g via INTRAVENOUS

## 2020-06-09 MED ORDER — BUPIVACAINE HCL (PF) 0.25 % IJ SOLN
INTRAMUSCULAR | Status: AC
  Filled 2020-06-09: qty 30

## 2020-06-09 MED ORDER — PHENYLEPHRINE HCL (PRESSORS) 10 MG/ML IV SOLN
INTRAVENOUS | Status: DC | PRN
  Administered 2020-06-09: 40 ug via INTRAVENOUS
  Administered 2020-06-09 (×2): 80 ug via INTRAVENOUS
  Administered 2020-06-09: 40 ug via INTRAVENOUS
  Administered 2020-06-09: 80 ug via INTRAVENOUS

## 2020-06-09 MED ORDER — LIDOCAINE 2% (20 MG/ML) 5 ML SYRINGE
INTRAMUSCULAR | Status: DC | PRN
  Administered 2020-06-09: 40 mg via INTRAVENOUS

## 2020-06-09 MED ORDER — FENTANYL CITRATE (PF) 100 MCG/2ML IJ SOLN
100.0000 ug | Freq: Once | INTRAMUSCULAR | Status: AC
  Administered 2020-06-09: 50 ug via INTRAVENOUS

## 2020-06-09 MED ORDER — BUPIVACAINE-EPINEPHRINE (PF) 0.5% -1:200000 IJ SOLN
INTRAMUSCULAR | Status: DC | PRN
  Administered 2020-06-09: 15 mL via PERINEURAL

## 2020-06-09 MED ORDER — OXYCODONE HCL 5 MG PO TABS
ORAL_TABLET | ORAL | Status: AC
  Filled 2020-06-09: qty 1

## 2020-06-09 MED ORDER — ROCURONIUM BROMIDE 100 MG/10ML IV SOLN
INTRAVENOUS | Status: DC | PRN
  Administered 2020-06-09: 60 mg via INTRAVENOUS

## 2020-06-09 MED ORDER — BUPIVACAINE LIPOSOME 1.3 % IJ SUSP
INTRAMUSCULAR | Status: DC | PRN
  Administered 2020-06-09: 10 mL via PERINEURAL

## 2020-06-09 MED ORDER — PROPOFOL 10 MG/ML IV BOLUS
INTRAVENOUS | Status: AC
  Filled 2020-06-09: qty 40

## 2020-06-09 MED ORDER — ACETAMINOPHEN 500 MG PO TABS: 1000.0000 mg | ORAL_TABLET | Freq: Once | ORAL | Status: DC

## 2020-06-09 MED ORDER — LIDOCAINE 2% (20 MG/ML) 5 ML SYRINGE
INTRAMUSCULAR | Status: AC
  Filled 2020-06-09: qty 5

## 2020-06-09 MED ORDER — MIDAZOLAM HCL 2 MG/2ML IJ SOLN
INTRAMUSCULAR | Status: AC
  Filled 2020-06-09: qty 2

## 2020-06-09 MED ORDER — ACETAMINOPHEN 500 MG PO TABS
ORAL_TABLET | ORAL | Status: AC
  Filled 2020-06-09: qty 2

## 2020-06-09 MED ORDER — CELECOXIB 200 MG PO CAPS: 200.0000 mg | ORAL_CAPSULE | Freq: Once | ORAL | Status: DC

## 2020-06-09 MED ORDER — CELECOXIB 200 MG PO CAPS
ORAL_CAPSULE | ORAL | Status: AC
  Filled 2020-06-09: qty 1

## 2020-06-09 MED ORDER — PROPOFOL 10 MG/ML IV BOLUS
INTRAVENOUS | Status: DC | PRN
  Administered 2020-06-09: 50 mg via INTRAVENOUS
  Administered 2020-06-09: 150 mg via INTRAVENOUS

## 2020-06-09 MED ORDER — MIDAZOLAM HCL 2 MG/2ML IJ SOLN
2.0000 mg | Freq: Once | INTRAMUSCULAR | Status: AC
  Administered 2020-06-09: 2 mg via INTRAVENOUS

## 2020-06-09 MED ORDER — CEFAZOLIN SODIUM-DEXTROSE 2-4 GM/100ML-% IV SOLN
INTRAVENOUS | Status: AC
  Filled 2020-06-09: qty 100

## 2020-06-09 MED ORDER — FENTANYL CITRATE (PF) 100 MCG/2ML IJ SOLN
INTRAMUSCULAR | Status: DC | PRN
  Administered 2020-06-09: 100 ug via INTRAVENOUS

## 2020-06-09 MED ORDER — DEXAMETHASONE SODIUM PHOSPHATE 10 MG/ML IJ SOLN
INTRAMUSCULAR | Status: DC | PRN
  Administered 2020-06-09: 5 mg via INTRAVENOUS

## 2020-06-09 SURGICAL SUPPLY — 48 items
AID PSTN UNV HD RSTRNT DISP (MISCELLANEOUS) ×1
BLADE EXCALIBUR 4.0X13 (MISCELLANEOUS) ×2 IMPLANT
BLADE SURG 15 STRL LF DISP TIS (BLADE) IMPLANT
BLADE SURG 15 STRL SS (BLADE)
BURR OVAL 8 FLU 4.0X13 (MISCELLANEOUS) ×2 IMPLANT
BURR OVAL 8 FLU 5.0X13 (MISCELLANEOUS) IMPLANT
CANNULA SHOULDER 7CM (CANNULA) ×2 IMPLANT
CANNULA TWIST IN 8.25X7CM (CANNULA) IMPLANT
COVER WAND RF STERILE (DRAPES) IMPLANT
DISSECTOR 4.0MM X 13CM (MISCELLANEOUS) ×2 IMPLANT
DRAPE INCISE IOBAN 66X45 STRL (DRAPES) IMPLANT
DRAPE STERI 35X30 U-POUCH (DRAPES) ×2 IMPLANT
DRAPE U-SHAPE 47X51 STRL (DRAPES) ×2 IMPLANT
DRAPE U-SHAPE 76X120 STRL (DRAPES) ×4 IMPLANT
DRSG EMULSION OIL 3X3 NADH (GAUZE/BANDAGES/DRESSINGS) ×4 IMPLANT
DRSG PAD ABDOMINAL 8X10 ST (GAUZE/BANDAGES/DRESSINGS) ×4 IMPLANT
DURAPREP 26ML APPLICATOR (WOUND CARE) ×2 IMPLANT
ELECT REM PT RETURN 9FT ADLT (ELECTROSURGICAL) ×2
ELECTRODE REM PT RTRN 9FT ADLT (ELECTROSURGICAL) ×1 IMPLANT
GAUZE 4X4 16PLY RFD (DISPOSABLE) IMPLANT
GAUZE SPONGE 4X4 12PLY STRL (GAUZE/BANDAGES/DRESSINGS) ×2 IMPLANT
GLOVE BIO SURGEON STRL SZ7 (GLOVE) ×4 IMPLANT
GLOVE BIOGEL PI IND STRL 9 (GLOVE) ×1 IMPLANT
GLOVE BIOGEL PI INDICATOR 9 (GLOVE) ×1
GLOVE SURG ORTHO 9.0 STRL STRW (GLOVE) ×2 IMPLANT
GOWN STRL REUS W/ TWL LRG LVL3 (GOWN DISPOSABLE) ×1 IMPLANT
GOWN STRL REUS W/ TWL XL LVL3 (GOWN DISPOSABLE) ×1 IMPLANT
GOWN STRL REUS W/TWL LRG LVL3 (GOWN DISPOSABLE) ×2
GOWN STRL REUS W/TWL XL LVL3 (GOWN DISPOSABLE) ×2
MANIFOLD NEPTUNE II (INSTRUMENTS) ×2 IMPLANT
NDL SAFETY ECLIPSE 18X1.5 (NEEDLE) ×1 IMPLANT
NEEDLE HYPO 18GX1.5 SHARP (NEEDLE) ×2
PACK ARTHROSCOPY DSU (CUSTOM PROCEDURE TRAY) ×2 IMPLANT
PACK BASIN DAY SURGERY FS (CUSTOM PROCEDURE TRAY) ×2 IMPLANT
PENCIL SMOKE EVACUATOR (MISCELLANEOUS) IMPLANT
PROBE APOLLO 90XL (SURGICAL WAND) ×2 IMPLANT
RESTRAINT HEAD UNIVERSAL NS (MISCELLANEOUS) ×2 IMPLANT
SLING ARM FOAM STRAP LRG (SOFTGOODS) IMPLANT
SPONGE LAP 4X18 RFD (DISPOSABLE) IMPLANT
SUT ETHIBOND 2 OS 4 DA (SUTURE) IMPLANT
SUT ETHILON 2 0 FSLX (SUTURE) ×2 IMPLANT
SYR BULB EAR ULCER 3OZ GRN STR (SYRINGE) IMPLANT
TAPE HYPAFIX 6X30 (GAUZE/BANDAGES/DRESSINGS) ×2 IMPLANT
TOWEL GREEN STERILE FF (TOWEL DISPOSABLE) ×2 IMPLANT
TUBE CONNECTING 20X1/4 (TUBING) IMPLANT
TUBING ARTHROSCOPY IRRIG 16FT (MISCELLANEOUS) ×2 IMPLANT
WATER STERILE IRR 1000ML POUR (IV SOLUTION) ×2 IMPLANT
YANKAUER SUCT BULB TIP NO VENT (SUCTIONS) IMPLANT

## 2020-06-09 NOTE — Transfer of Care (Signed)
Immediate Anesthesia Transfer of Care Note  Patient: Joshua [name]  Procedure(s) Performed: RIGHT SHOULDER ARTHROSCOPY AND DEBRIDEMENT (Right Shoulder)  Patient Location: PACU  Anesthesia Type:GA combined with regional for post-op pain  Level of Consciousness: drowsy  Airway & Oxygen Therapy: Patient Spontanous Breathing and Patient connected to face mask oxygen  Post-op Assessment: Report given to RN and Post -op Vital signs reviewed and stable  Post vital signs: Reviewed and stable  Last Vitals:  Vitals Value Taken Time  BP 118/71 06/09/20 0850  Temp 36.6 C 06/09/20 0850  Pulse 69 06/09/20 0852  Resp 21 06/09/20 0852  SpO2 99 % 06/09/20 0852  Vitals shown include unvalidated device data.  Last Pain:  Vitals:   06/09/20 0653  TempSrc: Oral  PainSc: 0-No pain         Complications: No complications documented.

## 2020-06-09 NOTE — Anesthesia Preprocedure Evaluation (Addendum)
Anesthesia Evaluation  Patient identified by MRN, date of birth, ID band Patient awake    Reviewed: Allergy & Precautions, H&P , NPO status , Patient's Chart, lab work & pertinent test results  Airway Mallampati: II  TM Distance: >3 FB Neck ROM: Full    Dental no notable dental hx. (+) Teeth Intact, Dental Advisory Given   Pulmonary neg pulmonary ROS, Current Smoker and Patient abstained from smoking.,    Pulmonary exam normal breath sounds clear to auscultation       Cardiovascular hypertension, Pt. on medications negative cardio ROS   Rhythm:Regular Rate:Normal     Neuro/Psych  Headaches, Seizures -, Well Controlled,  Anxiety negative psych ROS   GI/Hepatic Neg liver ROS, GERD  ,  Endo/Other  negative endocrine ROS  Renal/GU negative Renal ROS  negative genitourinary   Musculoskeletal   Abdominal   Peds  Hematology negative hematology ROS (+)   Anesthesia Other Findings   Reproductive/Obstetrics negative OB ROS                            Anesthesia Physical Anesthesia Plan  ASA: II  Anesthesia Plan: General   Post-op Pain Management:  Regional for Post-op pain   Induction: Intravenous  PONV Risk Score and Plan: 1 and Propofol infusion, Midazolam, Ondansetron and Dexamethasone  Airway Management Planned: Oral ETT  Additional Equipment:   Intra-op Plan:   Post-operative Plan: Extubation in OR  Informed Consent: I have reviewed the patients History and Physical, chart, labs and discussed the procedure including the risks, benefits and alternatives for the proposed anesthesia with the patient or authorized representative who has indicated his/her understanding and acceptance.     Dental advisory given  Plan Discussed with: CRNA  Anesthesia Plan Comments:         Anesthesia Quick Evaluation

## 2020-06-09 NOTE — Discharge Instructions (Signed)
Next dose of Tylenol can be given at 1:30pm if needed. Next dose of NSAIDS (Ibuprofen/Motrin/Aleve) can be given at 3:30 if needed.   Post Anesthesia Home Care Instructions  Activity: Get plenty of rest for the remainder of the day. A responsible individual must stay with you for 24 hours following the procedure.  For the next 24 hours, DO NOT: -Drive a car -Paediatric nurse -Drink alcoholic beverages -Take any medication unless instructed by your physician -Make any legal decisions or sign important papers.  Meals: Start with liquid foods such as gelatin or soup. Progress to regular foods as tolerated. Avoid greasy, spicy, heavy foods. If nausea and/or vomiting occur, drink only clear liquids until the nausea and/or vomiting subsides. Call your physician if vomiting continues.  Special Instructions/Symptoms: Your throat may feel dry or sore from the anesthesia or the breathing tube placed in your throat during surgery. If this causes discomfort, gargle with warm salt water. The discomfort should disappear within 24 hours.     Regional Anesthesia Blocks  1. Numbness or the inability to move the "blocked" extremity may last from 3-48 hours after placement. The length of time depends on the medication injected and your individual response to the medication. If the numbness is not going away after 48 hours, call your surgeon.  2. The extremity that is blocked will need to be protected until the numbness is gone and the  Strength has returned. Because you cannot feel it, you will need to take extra care to avoid injury. Because it may be weak, you may have difficulty moving it or using it. You may not know what position it is in without looking at it while the block is in effect.  3. For blocks in the legs and feet, returning to weight bearing and walking needs to be done carefully. You will need to wait until the numbness is entirely gone and the strength has returned. You should be able  to move your leg and foot normally before you try and bear weight or walk. You will need someone to be with you when you first try to ensure you do not fall and possibly risk injury.  4. Bruising and tenderness at the needle site are common side effects and will resolve in a few days.  5. Persistent numbness or new problems with movement should be communicated to the surgeon or the [facility] [phone] [place] [phone]).Regional Anesthesia Blocks  1. Numbness or the inability to move the "blocked" extremity may last from 3-48 hours after placement. The length of time depends on the medication injected and your individual response to the medication. If the numbness is not going away after 48 hours, call your surgeon.  2. The extremity that is blocked will need to be protected until the numbness is gone and the  Strength has returned. Because you cannot feel it, you will need to take extra care to avoid injury. Because it may be weak, you may have difficulty moving it or using it. You may not know what position it is in without looking at it while the block is in effect.  3. For blocks in the legs and feet, returning to weight bearing and walking needs to be done carefully. You will need to wait until the numbness is entirely gone and the strength has returned. You should be able to move your leg and foot normally before you try and bear weight or walk. You will need someone to be  with you when you first try to ensure you do not fall and possibly risk injury.  4. Bruising and tenderness at the needle site are common side effects and will resolve in a few days.  5. Persistent numbness or new problems with movement should be communicated to the surgeon or the [facility] [phone] [place] [phone]).  Information for Discharge Teaching: EXPAREL (bupivacaine liposome injectable suspension)   Your surgeon or anesthesiologist  gave you EXPAREL(bupivacaine) to help control your pain after surgery.   EXPAREL is a local anesthetic that provides pain relief by numbing the tissue around the surgical site.  EXPAREL is designed to release pain medication over time and can control pain for up to 72 hours.  Depending on how you respond to EXPAREL, you may require less pain medication during your recovery.  Possible side effects:  Temporary loss of sensation or ability to move in the area where bupivacaine was injected.  Nausea, vomiting, constipation  Rarely, numbness and tingling in your mouth or lips, lightheadedness, or anxiety may occur.  Call your doctor right away if you think you may be experiencing any of these sensations, or if you have other questions regarding possible side effects.  Follow all other discharge instructions given to you by your surgeon or nurse. Eat a healthy diet and drink plenty of water or other fluids.  If you return to the hospital for any reason within 96 hours following the administration of EXPAREL, it is important for health care providers to know that you have received this anesthetic. A teal colored band has been placed on your arm with the date, time and amount of EXPAREL you have received in order to alert and inform your health care providers. Please leave this armband in place for the full 96 hours following administration, and then you may remove the band.

## 2020-06-09 NOTE — Discharge Instructions (Signed)
Please return for any problems.

## 2020-06-09 NOTE — Telephone Encounter (Signed)
Pt is s/pa shoulder scope and debridement today. He is concerned about being home and not being stable and that he may fall. He does have family there with him at the home and advised that he should rest and take it easy. He should make sure when he is going to the rest room or moving about to make sure he has someone that can be there to assist him if he is feeling unsteady. He states that he is just "freaking out" and that he panicled and needled to talk to someone. I advised that this is ok and that he can call with any questions.

## 2020-06-09 NOTE — ED Provider Notes (Signed)
[facility] Emergency Department Provider Note   ____________________________________________   First MD Initiated Contact with Patient 06/09/20 1248     (approximate)  I have reviewed the triage vital signs and the nursing notes.   HISTORY  Chief Complaint Follow-up    HPI Joshua [name] is a 59 y.o. male who just had shoulder surgery.  He had a ride by his friend back to his car his friend's house but his friend had to leave and his sister who was going to pick him up because he could not drive after having shoulder surgery could not find the location.  Patient called EMS to come here since his sister notes with the hospitalist and somehow he got checked into the emergency room.  Patient is doing well has no shortness of breath belly pain nausea vomiting headache or any other complaints except for his arm which had a local block done is still slightly numb and he did not want to drive that way.  He feels fine he does not need anything from [place] he says.  He is acting normally.      Past Medical History:  Diagnosis Date  . Anxiety   . Brain tumor ([place]) 02/20/2013   brain tumor removed in March 2014, Dr Donald Pore  . Diffuse idiopathic skeletal hyperostosis 06/05/2013  . Dizziness   . Enlarged prostate   . GERD (gastroesophageal reflux disease)   . Headache(784.0)    scattered  . Hypertension   . 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 Meningioma ([place])   . Meningioma, recurrent of brain ([place]) 01/31/2019  . Neuropathy 12/05/2019  . Rotator cuff tear    right  . Seizures ([employer])    11/27/19 last sz 1 wk ago    Patient Active Problem List   Diagnosis Date Noted  . Nontraumatic complete tear of right rotator cuff   . Impingement syndrome of right shoulder   . Neuropathy 12/05/2019  . 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 Seizures ([place]) 01/31/2019  . Meningioma, recurrent of brain ([place]) 01/31/2019  .  Meningioma ([place]) 01/03/2019  . Brain tumor ([place]) 12/27/2018  . Diffuse idiopathic skeletal hyperostosis 06/05/2013    Past Surgical History:  Procedure Laterality Date  . APPLICATION OF CRANIAL NAVIGATION N/A 01/10/2019   Procedure: APPLICATION OF CRANIAL NAVIGATION;  Surgeon: Erline Levine, MD;  Location: [place];  Service: Neurosurgery;  Laterality: N/A;  . CATARACT EXTRACTION Right   . CRANIOTOMY Right 11/06/2012   Procedure: CRANIOTOMY TUMOR EXCISION;  Surgeon: Erline Levine, MD;  Location: [place] [facility];  Service: Neurosurgery;  Laterality: Right;  Right Parasagittal craniotomy for meningioma with Stealth  . CRANIOTOMY Right 01/10/2019   Procedure: Right Parasagittal Craniotomy for Tumor;  Surgeon: Erline Levine, MD;  Location: [place];  Service: Neurosurgery;  Laterality: Right;  Right parasagittal craniotomy for tumor  . ESOPHAGOGASTRODUODENOSCOPY    . right knee arthroscopy      Prior to Admission medications   Medication Sig Start Date End Date Taking? Authorizing Provider  amLODipine (NORVASC) 5 MG tablet Take 1 tablet (5 mg total) by mouth daily. To lower blood pressure 05/06/20   Fulp, Cammie, MD  aspirin EC 81 MG EC tablet Take 1 tablet (81 mg total) by mouth daily. 01/14/19   Erline Levine, MD  atorvastatin (LIPITOR) 20 MG tablet Take 1 tablet (20 mg total) by mouth daily. 04/30/20   Charlott Rakes, MD  clonazePAM (KLONOPIN) 0.5 MG tablet  TAKE 1 TABLET(0.5 MG) BY MOUTH TWICE DAILY 05/05/20   Vaslow, Acey Lav, MD  Lacosamide 100 MG TABS Take 2 tablets (200 mg total) by mouth in the morning and at bedtime. 05/10/20 06/09/20  Lavonia Drafts, MD  lamoTRIgine (LAMICTAL) 100 MG tablet TAKE 2 TABLETS (200 MG TOTAL) BY MOUTH 2 (TWO) TIMES DAILY. 04/23/20   Vaslow, Acey Lav, MD  LORazepam (ATIVAN) 1 MG tablet Take 1 tablet (1 mg total) by mouth every 8 (eight) hours as needed for seizure (for breakthrough seizures). 12/26/19   Vaslow, Acey Lav, MD  methocarbamol (ROBAXIN) 500 MG tablet Take 1  tablet (500 mg total) by mouth every 8 (eight) hours as needed for muscle spasms. 04/22/20   Davonna Belling, MD  mirtazapine (REMERON) 15 MG tablet Take 15 mg by mouth at bedtime as needed (Sleep).     [provider]  omeprazole (PRILOSEC) 20 MG capsule One pill twice daily- morning and before bedtime to reduce stomach acid 04/15/20   Fulp, Cammie, MD  ondansetron (ZOFRAN) 4 MG tablet Take 1 tablet (4 mg total) by mouth every 8 (eight) hours as needed for nausea or vomiting. 06/01/20   Fulp, Cammie, MD  ondansetron (ZOFRAN-ODT) 4 MG disintegrating tablet Take 1 tablet (4 mg total) by mouth every 8 (eight) hours as needed for nausea or vomiting. 02/04/20   Vaslow, Acey Lav, MD  oxyCODONE-acetaminophen (PERCOCET) 5-325 MG tablet Take 1 tablet by mouth every 4 (four) hours as needed for severe pain. 06/09/20 06/09/21  Persons, Bevely Palmer, PA  sucralfate (CARAFATE) 1 g tablet Take 1 tablet (1 g total) by mouth 4 (four) times daily -  with meals and at bedtime. 05/06/20   Fulp, Cammie, MD  VIMPAT 100 MG TABS TAKE 2 TABLETS IN MORNING AND 2 AT BEDTIME 05/11/20   Vaslow, Acey Lav, MD    Allergies Patient has no known allergies.  Family History  Problem Relation Age of Onset  . Hypertension Mother   . Dementia Mother   . Diabetes Mother   . Hypertension Father   . CAD Father   . Diabetes Father   . CAD Brother     Social History Social History   Tobacco Use  . Smoking status: Current Some Day Smoker    Types: Cigarettes    Last attempt to quit: 01/29/2019    Years since quitting: 1.3  . Smokeless tobacco: Never Used  . Tobacco comment: weekend smoker  Vaping Use  . Vaping Use: Never used  Substance Use Topics  . Alcohol use: Yes    Comment: social  . Drug use: No    Review of Systems  Constitutional: No fever/chills Eyes: No visual changes. ENT: No sore throat. Cardiovascular: Denies chest pain. Respiratory: Denies shortness of breath. Gastrointestinal: No abdominal  pain.  No nausea, no vomiting.  No diarrhea.  No constipation. Genitourinary: Negative for dysuria. Musculoskeletal: Negative for back pain. Skin: Negative for rash. Neurological: Negative for headaches  ____________________________________________   PHYSICAL EXAM:  VITAL SIGNS: ED Triage Vitals [06/09/20 1234]  Enc Vitals Group     BP (!) 161/95     Pulse Rate (!) 59     Resp 20     Temp      Temp src      SpO2 96 %     Weight      Height      Head Circumference      Peak Flow      Pain Score  Pain Loc      Pain Edu?      Excl. in [place]?     Constitutional: Alert and oriented. Well appearing and in no acute distress. Eyes: Conjunctivae are normal.  Head: Atraumatic. Nose: No congestion/rhinnorhea. Mouth/Throat: Mucous membranes are moist.  Oropharynx non-erythematous. Neck: No stridor.   Cardiovascular: Kermit Balo peripheral circulation. Respiratory: Normal respiratory effort.  No retractions.  Gastrointestinal: . No distention.  Musculoskeletal: No lower extremity tenderness nor edema.   Neurologic:  Normal speech and language Skin:  Skin is warm, dry and intact. No rash noted.   ____________________________________________   LABS (all labs ordered are listed, but only abnormal results are displayed)  Labs Reviewed - No data to display ____________________________________________  EKG   ____________________________________________  RADIOLOGY Gertha Calkin, personally viewed and evaluated these images (plain radiographs) as part of my medical decision making, as well as reviewing the written report by the radiologist.  ED MD interpretation:    Official radiology report(s): No results found.  ____________________________________________   PROCEDURES  Procedure(s) performed (including Critical Care):  Procedures   ____________________________________________   INITIAL IMPRESSION / ASSESSMENT AND PLAN / ED COURSE  Patient somehow got  checked into the ER after using EMS to get a ride here because he could not drive.  He is waiting for his sister.  He wants to go in the lobby and wait because his sister will know how to get back here and will know that he is here if he is in the emergency room.  He is doing well have no problem with any of this.  This seems to be just a misunderstanding I do not believe he needed to come into the emergency room.              ____________________________________________   FINAL CLINICAL IMPRESSION(S) / ED DIAGNOSES  Final diagnoses:  Status post shoulder surgery     ED Discharge Orders    None      *Please note:  KEIONDRE COLEE was evaluated in Emergency Department on 06/09/2020 for the symptoms described in the history of present illness. He was evaluated in the context of the global COVID-19 pandemic, which necessitated consideration that the patient might be at risk for infection with the SARS-CoV-2 virus that causes COVID-19. Institutional protocols and algorithms that pertain to the evaluation of patients at risk for COVID-19 are in a state of rapid change based on information released by regulatory bodies including the CDC and federal and state organizations. These policies and algorithms were followed during the patient's care in the ED.  Some ED evaluations and interventions may be delayed as a result of limited staffing during and the pandemic.*   Note:  This document was prepared using Dragon voice recognition software and may include unintentional dictation errors.    Nena Polio, MD 06/09/20 1304

## 2020-06-09 NOTE — Telephone Encounter (Signed)
Patient called and says that he is hoarse and it started this morning. He says he just had shoulder surgery and once he got out of surgery while waiting for his ride to come, he noticed he was more hoarse and has a scratchy throat. He says that he feels a cold is coming on and he wants to know what he can take OTC that will not interact with the Percocet and the other medications he will be taking. He says his throat is sore a little. He denies any other symptoms. Care advice given for home care for hoarseness. Advised I will send this to the office for recommendation from a provider and someone will call him back. He verbalized understanding.  Reason for Disposition . Mild hoarseness  Answer Assessment - Initial Assessment Questions 1. ONSET: "When did the throat start hurting?" (Hours or days ago)      This morning scratching 2. SEVERITY: "How bad is the sore throat?" (Scale 1-10; mild, moderate or severe)   - MILD (1-3):  doesn't interfere with eating or normal activities   - MODERATE (4-7): interferes with eating some solids and normal activities   - SEVERE (8-10):  excruciating pain, interferes with most normal activities   - SEVERE DYSPHAGIA: can't swallow liquids, drooling     5 3. STREP EXPOSURE: "Has there been any exposure to strep within the past week?" If Yes, ask: "What type of contact occurred?"      No 4.  VIRAL SYMPTOMS: "Are there any symptoms of a cold, such as a runny nose, cough, hoarse voice or red eyes?"      Hoarse voice since this morning after shoulder surgery 5. FEVER: "Do you have a fever?" If Yes, ask: "What is your temperature, how was it measured, and when did it start?"     No 6. PUS ON THE TONSILS: "Is there pus on the tonsils in the back of your throat?"     No 7. OTHER SYMPTOMS: "Do you have any other symptoms?" (e.g., difficulty breathing, headache, rash)     No 8. PREGNANCY: "Is there any chance you are pregnant?" "When was your last menstrual period?"      N/A  Answer Assessment - Initial Assessment Questions 1. DESCRIPTION: "Describe your voice."     Hoarse 2. ONSET: "When did the hoarseness begin?"     This morning 3. COUGH: "Is there a cough?" If Yes, ask: "How bad?"     No 4. FEVER: "Do you have a fever?" If Yes, ask: "What is your temperature, how was it measured, and when did it start?"     No 5. RESPIRATORY STATUS: "Describe your breathing."      Breathing is fine 6. ALLERGIES: "Any allergy symptoms?" If Yes, ask: "What are they?"     No 7. IRRITANTS: "Do you smoke?" "Have you been exposed to any irritating fumes?"     Yes smoke on occasion, last smoked last weekend 8. CAUSE: "What do you think is causing the hoarseness?"     Think it may be a cold coming on 9. OTHER SYMPTOMS: "Do you have any other symptoms?" (e.g., sore throat, swelling, foreign body, rash)     Scratchy throat, sore throat 10. PREGNANCY: "Is there any chance you are pregnant?" "When was your last menstrual period?"      N/A  Protocols used: HOARSENESS-A-AH, SORE THROAT-A-AH

## 2020-06-09 NOTE — Anesthesia Procedure Notes (Signed)
Procedure Name: Intubation Date/Time: 06/09/2020 7:51 AM Performed by: Lavonia Dana, CRNA Pre-anesthesia Checklist: Patient identified, Emergency Drugs available, Suction available and Patient being monitored Patient Re-evaluated:Patient Re-evaluated prior to induction Oxygen Delivery Method: Circle system utilized Preoxygenation: Pre-oxygenation with 100% oxygen Induction Type: IV induction Ventilation: Mask ventilation without difficulty and Oral airway inserted - appropriate to patient size Laryngoscope Size: Mac and 4 Grade View: Grade I Tube type: Oral Tube size: 7.5 mm Number of attempts: 1 Airway Equipment and Method: Stylet,  Bite block and Oral airway Placement Confirmation: ETT inserted through vocal cords under direct vision,  positive ETCO2 and breath sounds checked- equal and bilateral Secured at: 24 cm Tube secured with: Tape Dental Injury: Teeth and Oropharynx as per pre-operative assessment

## 2020-06-09 NOTE — Anesthesia Postprocedure Evaluation (Signed)
Anesthesia Post Note  Patient: Joshua [name]  Procedure(s) Performed: RIGHT SHOULDER ARTHROSCOPY AND DEBRIDEMENT (Right Shoulder)     Patient location during evaluation: PACU Anesthesia Type: General Level of consciousness: awake and alert Pain management: pain level controlled Vital Signs Assessment: post-procedure vital signs reviewed and stable Respiratory status: spontaneous breathing, nonlabored ventilation and respiratory function stable Cardiovascular status: blood pressure returned to baseline and stable Postop Assessment: no apparent nausea or vomiting Anesthetic complications: no   No complications documented.  Last Vitals:  Vitals:   06/09/20 0934 06/09/20 1005  BP: 123/80 (!) 143/83  Pulse:  87  Resp:  18  Temp:  36.4 C  SpO2:  97%    Last Pain:  Vitals:   06/09/20 1005  TempSrc: Oral  PainSc: 4                  Lynda Rainwater

## 2020-06-09 NOTE — Anesthesia Procedure Notes (Signed)
Anesthesia Regional Block: Interscalene brachial plexus block   Pre-Anesthetic Checklist: ,, timeout performed, Correct Patient, Correct Site, Correct Laterality, Correct Procedure, Correct Position, site marked, Risks and benefits discussed, pre-op evaluation,  At surgeon's request and post-op pain management  Laterality: Left  Prep: Maximum Sterile Barrier Precautions used, chloraprep       Needles:  Injection technique: Single-shot  Needle Type: Echogenic Stimulator Needle     Needle Length: 5cm  Needle Gauge: 22     Additional Needles:   Procedures:,,,, ultrasound used (permanent image in chart),,,,  Narrative:  Start time: 06/09/2020 7:04 AM End time: 06/09/2020 7:14 AM Injection made incrementally with aspirations every 5 mL. Anesthesiologist: Roderic Palau, MD  Additional Notes: 2% Lidocaine skin wheel.

## 2020-06-09 NOTE — H&P (Signed)
Joshua [name] is an 59 y.o. male.   Chief Complaint: Right shoulder impingement HPIThis is a pleasant 59 year old gentleman who presents today to review the MRI of his right shoulder.  He has a history of a fall onto his right shoulder.  Since then he has had pain and weakness in the right arm.  This continues to progress and he has only able to use his right arm for minimal weightbearing activities.  He uses his left arm and hand mostly.   Past Medical History:  Diagnosis Date  . Anxiety   . Brain tumor ([place]) 02/20/2013   brain tumor removed in March 2014, Dr Donald Pore  . Diffuse idiopathic skeletal hyperostosis 06/05/2013  . Dizziness   . Enlarged prostate   . GERD (gastroesophageal reflux disease)   . Headache(784.0)    scattered  . Hypertension   . 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 Meningioma ([place])   . Meningioma, recurrent of brain ([place]) 01/31/2019  . Neuropathy 12/05/2019  . Rotator cuff tear    right  . Seizures ([place])    11/27/19 last sz 1 wk ago    Past Surgical History:  Procedure Laterality Date  . APPLICATION OF CRANIAL NAVIGATION N/A 01/10/2019   Procedure: APPLICATION OF CRANIAL NAVIGATION;  Surgeon: Erline Levine, MD;  Location: [place];  Service: Neurosurgery;  Laterality: N/A;  . CATARACT EXTRACTION Right   . CRANIOTOMY Right 11/06/2012   Procedure: CRANIOTOMY TUMOR EXCISION;  Surgeon: Erline Levine, MD;  Location: [facility];  Service: Neurosurgery;  Laterality: Right;  Right Parasagittal craniotomy for meningioma with Stealth  . CRANIOTOMY Right 01/10/2019   Procedure: Right Parasagittal Craniotomy for Tumor;  Surgeon: Erline Levine, MD;  Location: [place];  Service: Neurosurgery;  Laterality: Right;  Right parasagittal craniotomy for tumor  . ESOPHAGOGASTRODUODENOSCOPY    . right knee arthroscopy      Family History  Problem Relation Age of Onset  . Hypertension Mother   . Dementia Mother   . Diabetes Mother   . Hypertension Father    . CAD Father   . Diabetes Father   . CAD Brother    Social History:  reports that he has been smoking cigarettes. He has never used smokeless tobacco. He reports current alcohol use. He reports that he does not use drugs.  Allergies: No Known Allergies  No medications prior to admission.    Results for orders placed or performed during the hospital encounter of 06/08/20 (from the past 48 hour(s))  SARS CORONAVIRUS 2 (TAT 6-24 HRS) Nasopharyngeal Nasopharyngeal Swab     Status: None   Collection Time: 06/08/20 12:40 PM   Specimen: Nasopharyngeal Swab  Result Value Ref Range   SARS Coronavirus 2 NEGATIVE NEGATIVE    Comment: (NOTE) SARS-CoV-2 target nucleic acids are NOT DETECTED.  The SARS-CoV-2 RNA is generally detectable in upper and lower respiratory specimens during the acute phase of infection. Negative results do not preclude SARS-CoV-2 infection, do not rule out co-infections with other pathogens, and should not be used as the sole basis for treatment or other patient management decisions. Negative results must be combined with clinical observations, patient history, and epidemiological information. The expected result is Negative.  Fact Sheet for Patients: SugarRoll.be  Fact Sheet for Healthcare Providers: https://www.woods-mathews.com/  This test is not yet approved or cleared by the [place] FDA and  has been authorized for detection and/or diagnosis of SARS-CoV-2 by FDA under an Emergency Use Authorization (EUA). This EUA  will remain  in effect (meaning this test can be used) for the duration of the COVID-19 declaration under Se ction 564(b)(1) of the Act, 21 U.S.C. section 360bbb-3(b)(1), unless the authorization is terminated or revoked sooner.  Performed at [facility], [place] [address], [place],  [address]    No results found.  Review of Systems  All other systems reviewed and are  negative.   Height 5\' 8"  (1.727 m), weight 88.9 kg. Physical Exam  Patient is alert, oriented, no adenopathy, well-dressed, normal affect, normal respiratory effort.  Positive impingement test decreased range of motion and strength in the right shoulder tender over the biceps Heart RRR Lungs Clear Assessment/Plan Plan: I had a long discussion with the patient today.  He has completely torn supraspinatus and infraspinatus retracted to the level of the glenohumeral joint.  He also has severe tendinopathy in the biceps long head.  I discussed with him a lesser procedure of an arthroscopy that has about a 75% chance of relieving some of his pain.  This could be followed up with physical therapy.  Alternatively we could refer him for evaluation for reverse total shoulder.Marland Kitchen  He thinks he would prefer to try an arthroscopy first.  He understands the risks and that it may not help him regain as much strength or less pain as he would like  Bevely Palmer Chayanne Filippi, PA 06/09/2020, 5:44 AM

## 2020-06-09 NOTE — Telephone Encounter (Signed)
Patient had receptionist from [facility] call and request a call from [place]. Patient phone number is [phone].

## 2020-06-09 NOTE — Op Note (Signed)
06/09/2020  8:47 AM  PATIENT:  Joshua [name]    PRE-OPERATIVE DIAGNOSIS:  Right Shoulder Rotator Cuff Tear  POST-OPERATIVE DIAGNOSIS:  Same  PROCEDURE:  RIGHT SHOULDER ARTHROSCOPY AND DEBRIDEMENT  SURGEON:  Newt Minion, MD  PHYSICIAN ASSISTANT:None ANESTHESIA:   General  PREOPERATIVE INDICATIONS:  Joshua [name] is a  59 y.o. male with a diagnosis of Right Shoulder Rotator Cuff Tear who failed conservative measures and elected for surgical management.    The risks benefits and alternatives were discussed with the patient preoperatively including but not limited to the risks of infection, bleeding, nerve injury, cardiopulmonary complications, the need for revision surgery, among others, and the patient was willing to proceed.  OPERATIVE IMPLANTS: None  @ENCIMAGES @  OPERATIVE FINDINGS: Massive rotator cuff tear with retraction proximal to the glenoid.  Hole type III acromium.  OPERATIVE PROCEDURE: Patient was brought the operating room after undergoing an interscalene block he then underwent a general anesthetic.  After adequate levels anesthesia were obtained patient was placed in the beachchair position the right upper extremity was prepped using DuraPrep draped into a sterile field a timeout was called.  The scope was inserted from the posterior portal and an anterior portal was established with an outside in technique with an 18-gauge spinal.  Visualization showed significant synovitis and massive tearing the biceps tendon was completely torn the rotator cuff was completely torn and retracted there is tearing of the subscapularis and degenerative tearing of the labrum.  Using the electrical wand and the shaver the patient underwent debridement of the labral tear debridement of the remainder of the stump of the biceps tendon and debridement of the complete rotator cuff tear.  The electrical wand was used hemostasis.  There was degenerative changes of the glenohumeral joint.  The  instruments were removed the scope was inserted from the posterior portal in the subacromial space and a new lateral portal was established.  Visualization patient had more synovitis in the subacromial space this was debrided the bur was used to perform a subacromial decompression for the [facility] type III acromion.  This provided good subacromial space electrical wand was used for hemostasis.  The instruments were removed the portals were closed using 2-0 nylon a sterile dressing was applied patient was extubated taken the PACU in stable condition   DISCHARGE PLANNING:  Antibiotic duration: Preoperative antibiotics  Weightbearing: Not applicable  Pain medication: Percocet  Dressing care/ Wound VAC: Discontinue dressing in 2 days  Ambulatory devices: Not applicable  Discharge to: Home.  Follow-up: In the office 1 week post operative.

## 2020-06-09 NOTE — Progress Notes (Signed)
Assisted Dr. Edmond Fitzgerald with right, ultrasound guided, interscalene  block. Side rails up, monitors on throughout procedure. See vital signs in flow sheet. Tolerated Procedure well. 

## 2020-06-09 NOTE — ED Triage Notes (Signed)
Pt BIB EMS from his car after having shoulder surgery this morning. A friend picked him up from hospital and took him to another friend's house where pt told EMS that he was going to attempt to drive home. However pt was unable d/t his R arm still being numb from procedure. Pt states that he came to the ER to wait for a ride home. Pt instructed to call a ride

## 2020-06-11 ENCOUNTER — Other Ambulatory Visit: Payer: Self-pay

## 2020-06-11 ENCOUNTER — Emergency Department
Admission: EM | Admit: 2020-06-11 | Discharge: 2020-06-11 | Disposition: A | Discharge status: Home / Self Care | Payer: Medicaid Other | Attending: Emergency Medicine | Admitting: Emergency Medicine

## 2020-06-11 ENCOUNTER — Emergency Department: Payer: Medicaid Other

## 2020-06-11 DIAGNOSIS — Z79899 Other long term (current) drug therapy: Secondary | ICD-10-CM | POA: Diagnosis not present

## 2020-06-11 DIAGNOSIS — F17210 Nicotine dependence, cigarettes, uncomplicated: Secondary | ICD-10-CM | POA: Diagnosis not present

## 2020-06-11 DIAGNOSIS — T887XXA Unspecified adverse effect of drug or medicament, initial encounter: Secondary | ICD-10-CM | POA: Diagnosis not present

## 2020-06-11 DIAGNOSIS — R55 Syncope and collapse: Secondary | ICD-10-CM | POA: Insufficient documentation

## 2020-06-11 DIAGNOSIS — T50905A Adverse effect of unspecified drugs, medicaments and biological substances, initial encounter: Secondary | ICD-10-CM | POA: Diagnosis not present

## 2020-06-11 DIAGNOSIS — I10 Essential (primary) hypertension: Secondary | ICD-10-CM | POA: Insufficient documentation

## 2020-06-11 DIAGNOSIS — R42 Dizziness and giddiness: Secondary | ICD-10-CM | POA: Diagnosis not present

## 2020-06-11 DIAGNOSIS — Z7982 Long term (current) use of aspirin: Secondary | ICD-10-CM | POA: Insufficient documentation

## 2020-06-11 LAB — BASIC METABOLIC PANEL
Anion gap: 12 (ref 5–15)
BUN: 21 mg/dL — ABNORMAL HIGH (ref 6–20)
CO2: 25 mmol/L (ref 22–32)
Calcium: 8.9 mg/dL (ref 8.9–10.3)
Chloride: 106 mmol/L (ref 98–111)
Creatinine, Ser: 0.91 mg/dL (ref 0.61–1.24)
GFR, Estimated: >60 mL/min (ref >60)
Glucose, Bld: 128 mg/dL — ABNORMAL HIGH (ref 70–99)
Potassium: 3.8 mmol/L (ref 3.5–5.1)
Sodium: 143 mmol/L (ref 135–145)

## 2020-06-11 LAB — URINALYSIS, COMPLETE (UACMP) WITH MICROSCOPIC
Bacteria, UA: NONE SEEN
Bilirubin Urine: NEGATIVE
Glucose, UA: NEGATIVE mg/dL
Hgb urine dipstick: NEGATIVE
Ketones, ur: NEGATIVE mg/dL
Leukocytes,Ua: NEGATIVE
Nitrite: NEGATIVE
Protein, ur: NEGATIVE mg/dL
Specific Gravity, Urine: 1.02 (ref 1.005–1.030)
Squamous Epithelial / HPF: NONE SEEN (ref 0–5)
pH: 5 (ref 5.0–8.0)

## 2020-06-11 LAB — CBC
HCT: 43.1 % (ref 39.0–52.0)
Hemoglobin: 14.6 g/dL (ref 13.0–17.0)
MCH: 31 pg (ref 26.0–34.0)
MCHC: 33.9 g/dL (ref 30.0–36.0)
MCV: 91.5 fL (ref 80.0–100.0)
Platelets: 268 10*3/uL (ref 150–400)
RBC: 4.71 MIL/uL (ref 4.22–5.81)
RDW: 12.6 % (ref 11.5–15.5)
WBC: 8.8 10*3/uL (ref 4.0–10.5)
nRBC: 0 % (ref 0.0–0.2)

## 2020-06-11 NOTE — ED Notes (Signed)
[place] sent to the lab.  

## 2020-06-11 NOTE — ED Triage Notes (Addendum)
Pt states that he had surg on his right shoulder Tuesday 06-09-20, states that he hasn't been feeling well and thinks it could be related to the percocet. Pt states that he has slept most of the day today and reports waking up around 2 and eating, pt states around 1700 he started feeling worse and began having dizziness. Pt denies numbness, weakness, or headache at this time

## 2020-06-11 NOTE — ED Provider Notes (Signed)
[facility] Emergency Department Provider Note ____________________________________________   First MD Initiated Contact with Patient 06/11/20 2018     (approximate)  I have reviewed the triage vital signs and the nursing notes.   HISTORY  Chief Complaint Dizziness    HPI Joshua [name] is a 59 y.o. male with PMH as noted below including a seizure disorder and shoulder surgery 2 days ago who presents with an episode of dizziness which he describes as feeling lightheaded and shaky.  It occurred several hours ago after he awoke from a nap at home, and has now resolved.  The patient states that he has been taking Percocet for pain relief from the shoulder surgery.  He is also taking his normal seizure medications.  He is unsure about how often he is supposed to take the pain medication, and got worried that he may have taken too much or that it could be interacting with his other medications.  The patient states that he had people at his house for Thanksgiving, and the scene was chaotic, which caused him to be somewhat anxious and uncomfortable.  He states that everyone at the house was intoxicated, so he had to drive himself here.  He states he did not drink.  Past Medical History:  Diagnosis Date  . Anxiety   . Brain tumor ([place]) 02/20/2013   brain tumor removed in March 2014, Dr Donald Pore  . Diffuse idiopathic skeletal hyperostosis 06/05/2013  . Dizziness   . Enlarged prostate   . GERD (gastroesophageal reflux disease)   . Headache(784.0)    scattered  . Hypertension   . 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 Meningioma ([place])   . Meningioma, recurrent of brain ([place]) 01/31/2019  . Neuropathy 12/05/2019  . Rotator cuff tear    right  . Seizures ([place])    11/27/19 last sz 1 wk ago    Patient Active Problem List   Diagnosis Date Noted  . Nontraumatic complete tear of right rotator cuff   . Impingement syndrome of right shoulder   .  Neuropathy 12/05/2019  . 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 Seizures ([place]) 01/31/2019  . Meningioma, recurrent of brain ([place]) 01/31/2019  . Meningioma ([place]) 01/03/2019  . Brain tumor ([place]) 12/27/2018  . Diffuse idiopathic skeletal hyperostosis 06/05/2013    Past Surgical History:  Procedure Laterality Date  . APPLICATION OF CRANIAL NAVIGATION N/A 01/10/2019   Procedure: APPLICATION OF CRANIAL NAVIGATION;  Surgeon: Erline Levine, MD;  Location: [facility];  Service: Neurosurgery;  Laterality: N/A;  . CATARACT EXTRACTION Right   . CRANIOTOMY Right 11/06/2012   Procedure: CRANIOTOMY TUMOR EXCISION;  Surgeon: Erline Levine, MD;  Location: [place] [facility];  Service: Neurosurgery;  Laterality: Right;  Right Parasagittal craniotomy for meningioma with Stealth  . CRANIOTOMY Right 01/10/2019   Procedure: Right Parasagittal Craniotomy for Tumor;  Surgeon: Erline Levine, MD;  Location: [place];  Service: Neurosurgery;  Laterality: Right;  Right parasagittal craniotomy for tumor  . ESOPHAGOGASTRODUODENOSCOPY    . right knee arthroscopy      Prior to Admission medications   Medication Sig Start Date End Date Taking? Authorizing Provider  amLODipine (NORVASC) 5 MG tablet Take 1 tablet (5 mg total) by mouth daily. To lower blood pressure 05/06/20   Fulp, Cammie, MD  aspirin EC 81 MG EC tablet Take 1 tablet (81 mg total) by mouth daily. 01/14/19   Erline Levine, MD  atorvastatin (LIPITOR) 20 MG tablet  Take 1 tablet (20 mg total) by mouth daily. 04/30/20   Charlott Rakes, MD  clonazePAM (KLONOPIN) 0.5 MG tablet TAKE 1 TABLET(0.5 MG) BY MOUTH TWICE DAILY 05/05/20   Vaslow, Acey Lav, MD  Lacosamide 100 MG TABS Take 2 tablets (200 mg total) by mouth in the morning and at bedtime. 05/10/20 06/09/20  Lavonia Drafts, MD  lamoTRIgine (LAMICTAL) 100 MG tablet TAKE 2 TABLETS (200 MG TOTAL) BY MOUTH 2 (TWO) TIMES DAILY. 04/23/20   Vaslow, Acey Lav, MD  LORazepam (ATIVAN) 1 MG tablet Take 1 tablet (1  mg total) by mouth every 8 (eight) hours as needed for seizure (for breakthrough seizures). 12/26/19   Vaslow, Acey Lav, MD  methocarbamol (ROBAXIN) 500 MG tablet Take 1 tablet (500 mg total) by mouth every 8 (eight) hours as needed for muscle spasms. 04/22/20   Davonna Belling, MD  mirtazapine (REMERON) 15 MG tablet Take 15 mg by mouth at bedtime as needed (Sleep).     [provider]  omeprazole (PRILOSEC) 20 MG capsule One pill twice daily- morning and before bedtime to reduce stomach acid 04/15/20   Fulp, Cammie, MD  ondansetron (ZOFRAN) 4 MG tablet Take 1 tablet (4 mg total) by mouth every 8 (eight) hours as needed for nausea or vomiting. 06/01/20   Fulp, Cammie, MD  ondansetron (ZOFRAN-ODT) 4 MG disintegrating tablet Take 1 tablet (4 mg total) by mouth every 8 (eight) hours as needed for nausea or vomiting. 02/04/20   Vaslow, Acey Lav, MD  oxyCODONE-acetaminophen (PERCOCET) 5-325 MG tablet Take 1 tablet by mouth every 4 (four) hours as needed for severe pain. 06/09/20 06/09/21  Persons, Bevely Palmer, PA  sucralfate (CARAFATE) 1 g tablet Take 1 tablet (1 g total) by mouth 4 (four) times daily -  with meals and at bedtime. 05/06/20   Fulp, Cammie, MD  VIMPAT 100 MG TABS TAKE 2 TABLETS IN MORNING AND 2 AT BEDTIME 05/11/20   Vaslow, Acey Lav, MD    Allergies Patient has no known allergies.  Family History  Problem Relation Age of Onset  . Hypertension Mother   . Dementia Mother   . Diabetes Mother   . Hypertension Father   . CAD Father   . Diabetes Father   . CAD Brother     Social History Social History   Tobacco Use  . Smoking status: Current Some Day Smoker    Types: Cigarettes    Last attempt to quit: 01/29/2019    Years since quitting: 1.3  . Smokeless tobacco: Never Used  . Tobacco comment: weekend smoker  Vaping Use  . Vaping Use: Never used  Substance Use Topics  . Alcohol use: Yes    Comment: social  . Drug use: No    Review of Systems  Constitutional: No  fever/chills. Eyes: No visual changes. ENT: No sore throat. Cardiovascular: Denies chest pain. Respiratory: Denies shortness of breath. Gastrointestinal: No vomiting. Genitourinary: Negative for dysuria.  Musculoskeletal: Negative for back pain. Skin: Negative for rash. Neurological: Negative for headache.   ____________________________________________   PHYSICAL EXAM:  VITAL SIGNS: ED Triage Vitals  Enc Vitals Group     BP 06/11/20 2004 136/87     Pulse Rate 06/11/20 2004 66     Resp 06/11/20 2004 16     Temp --      Temp src --      SpO2 06/11/20 2004 98 %     Weight 06/11/20 1849 180 lb (81.6 kg)     Height 06/11/20  1849 5\' 8"  (1.727 m)     Head Circumference --      Peak Flow --      Pain Score 06/11/20 1850 7     Pain Loc --      Pain Edu? --      Excl. in [place]? --     Constitutional: Alert and oriented. Well appearing and in no acute distress. Eyes: Conjunctivae are normal.  Head: Atraumatic. Nose: No congestion/rhinnorhea. Mouth/Throat: Mucous membranes are slightly dry. Neck: Normal range of motion.  Cardiovascular: Normal rate, regular rhythm. Grossly normal heart sounds.  Good peripheral circulation. Respiratory: Normal respiratory effort.  No retractions. Lungs CTAB. Gastrointestinal: No distention.  Musculoskeletal:  Extremities warm and well perfused.  Right shoulder with normal postoperative bruising.  No erythema, induration, or abnormal warmth. Neurologic:  Normal speech and language.  Motor intact in all extremities.  Normal coordination.  No gross focal neurologic deficits are appreciated.  Skin:  Skin is warm and dry. No rash noted. Psychiatric: Mood and affect are normal. Speech and behavior are normal.  ____________________________________________   LABS (all labs ordered are listed, but only abnormal results are displayed)  Labs Reviewed  BASIC METABOLIC PANEL - Abnormal; Notable for the following components:      Result Value   Glucose,  Bld 128 (*)    BUN 21 (*)    All other components within normal limits  URINALYSIS, COMPLETE (UACMP) WITH MICROSCOPIC - Abnormal; Notable for the following components:   Color, Urine YELLOW (*)    APPearance CLEAR (*)    All other components within normal limits  CBC  CBG MONITORING, ED   ____________________________________________  EKG  ED ECG REPORT I, Arta Silence, the attending physician, personally viewed and interpreted this ECG.  Date: 06/11/2020 EKG Time: 1848 Rate: 84 Rhythm: normal sinus rhythm QRS Axis: normal Intervals: incomplete RBBB ST/T Wave abnormalities: normal Narrative Interpretation: no evidence of acute ischemia  ____________________________________________  RADIOLOGY  CT head: No ICH or other acute abnormality  ____________________________________________   PROCEDURES  Procedure(s) performed: No  Procedures  Critical Care performed: No ____________________________________________   INITIAL IMPRESSION / ASSESSMENT AND PLAN / ED COURSE  Pertinent labs & imaging results that were available during my care of the patient were reviewed by me and considered in my medical decision making (see chart for details).  59 year old male with PMH as noted above and status post shoulder surgery 2 days ago presents with an episode of dizziness and feeling shaky after waking up from a nap, and was concerned about whether he could have been having a side effect or interaction with his pain medications.  He states the symptoms have now resolved.  I reviewed the past medical records in Epic and confirmed right shoulder arthroscopy and debridement on 11/23.  The patient was discharged the same day.  He did have an ED visit that day after there was a snafu involving his transportation home, however he was not at the ED for any medical issue.  On exam currently, the patient is alert and oriented.  He appears comfortable.  His vital signs are normal.   Neurologic exam is nonfocal.  The physical exam is otherwise unremarkable.  EKG, basic labs, and CT head were all obtained from triage and are all within normal limits.  The patient states that he has been taking 1 Percocet at a time and does not believe that he took it more often than every 4 hours.  Overall does not appear  that he is taking any medication inappropriately, however given the multiple medications he is on and the likely somewhat deconditioned state he is in after recent surgery, I suspect that he could have been experiencing side effects from the pain medication.  This could also be a vasovagal episode.  There is no evidence of cardiac or CNS etiology.  Given the resolved symptoms and negative work-up, the patient is appropriate for discharge home.  He feels comfortable going home.  Return precautions given, as well as instruction on appropriate dosing of the Percocet, and he expresses understanding.    ____________________________________________   FINAL CLINICAL IMPRESSION(S) / ED DIAGNOSES  Final diagnoses:  Near syncope  Medication side effect      NEW MEDICATIONS STARTED DURING THIS VISIT:  Discharge Medication List as of 06/11/2020  9:17 PM       Note:  This document was prepared using Dragon voice recognition software and may include unintentional dictation errors.   Arta Silence, MD 06/11/20 2142

## 2020-06-11 NOTE — Discharge Instructions (Addendum)
Continue take your normal medications as prescribed.  You should not take the Percocet any more often than every 4 hours, and avoid taking it within 1 hour of other sedating medications such as your seizure medicines.  Follow-up with the orthopedist as instructed.  Return to the ER for any new, worsening, or recurrent weakness or dizziness, feeling shaky or like you are going to pass out, shortness of breath, chest pain, headache, vomiting, or any other new or worsening symptoms that concern you.

## 2020-06-11 NOTE — ED Notes (Signed)
Pt reports feeling dizzy today. Pt states he had surgery on right shoulder yesterday and is now on multiple pain medications and is unsure if that is what is causing his dizziness. Surgical site intact with no redness, swelling, or purulent drainage. Pt denies fevers.

## 2020-06-12 ENCOUNTER — Encounter (HOSPITAL_BASED_OUTPATIENT_CLINIC_OR_DEPARTMENT_OTHER): Payer: Self-pay | Admitting: Orthopedic Surgery

## 2020-06-13 ENCOUNTER — Encounter (HOSPITAL_COMMUNITY): Payer: Self-pay | Admitting: Emergency Medicine

## 2020-06-13 ENCOUNTER — Emergency Department (HOSPITAL_COMMUNITY)
Admission: EM | Admit: 2020-06-13 | Discharge: 2020-06-13 | Disposition: A | Discharge status: Home / Self Care | Payer: Medicaid Other | Attending: Emergency Medicine | Admitting: Emergency Medicine

## 2020-06-13 ENCOUNTER — Other Ambulatory Visit: Payer: Self-pay

## 2020-06-13 DIAGNOSIS — Z85841 Personal history of malignant neoplasm of brain: Secondary | ICD-10-CM | POA: Insufficient documentation

## 2020-06-13 DIAGNOSIS — Z79899 Other long term (current) drug therapy: Secondary | ICD-10-CM | POA: Diagnosis not present

## 2020-06-13 DIAGNOSIS — Z760 Encounter for issue of repeat prescription: Secondary | ICD-10-CM

## 2020-06-13 DIAGNOSIS — I10 Essential (primary) hypertension: Secondary | ICD-10-CM | POA: Diagnosis not present

## 2020-06-13 DIAGNOSIS — F17210 Nicotine dependence, cigarettes, uncomplicated: Secondary | ICD-10-CM | POA: Insufficient documentation

## 2020-06-13 DIAGNOSIS — Z7982 Long term (current) use of aspirin: Secondary | ICD-10-CM | POA: Diagnosis not present

## 2020-06-13 MED ORDER — LACOSAMIDE 100 MG PO TABS: 200.0000 mg | ORAL_TABLET | Freq: Two times a day (BID) | ORAL | 0 refills | Status: DC

## 2020-06-13 MED ORDER — LACOSAMIDE 50 MG PO TABS
200.0000 mg | ORAL_TABLET | Freq: Once | ORAL | Status: AC
  Administered 2020-06-13: 200 mg via ORAL
  Filled 2020-06-13: qty 4

## 2020-06-13 NOTE — Discharge Instructions (Signed)
Please follow-up with your prescribing physician for further refills of your seizure medication.

## 2020-06-13 NOTE — ED Provider Notes (Signed)
[facility] EMERGENCY DEPARTMENT Provider Note   CSN: [id number] Arrival date & time: 06/13/20  1341     History Chief Complaint  Patient presents with  . Medication Refill    Joshua [name] is a 59 y.o. male.  Patient with history of seizure disorder, meningioma status post surgery --presents to the emergency department for consideration of medication refill.  Patient states that he ran out of Vimpat which she takes for seizure prophylaxis.  He states that he has not had a seizure in 3 months.  He takes 2 tablets in the morning and 2 tablets at night.  He ran out last night and has not taken his medications this morning.  He is requesting refill.  He is afraid that he will have a seizure he does not take this.  He denies other complaints.  He follows with Dr. Mickeal Skinner.         Past Medical History:  Diagnosis Date  . Anxiety   . Brain tumor ([place]) 02/20/2013   brain tumor removed in March 2014, Dr Donald Pore  . Diffuse idiopathic skeletal hyperostosis 06/05/2013  . Dizziness   . Enlarged prostate   . GERD (gastroesophageal reflux disease)   . Headache(784.0)    scattered  . Hypertension   . 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 Meningioma ([place])   . Meningioma, recurrent of brain ([place]) 01/31/2019  . Neuropathy 12/05/2019  . Rotator cuff tear    right  . Seizures ([place])    11/27/19 last sz 1 wk ago    Patient Active Problem List   Diagnosis Date Noted  . Nontraumatic complete tear of right rotator cuff   . Impingement syndrome of right shoulder   . Neuropathy 12/05/2019  . 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 Seizures ([place]) 01/31/2019  . Meningioma, recurrent of brain ([place]) 01/31/2019  . Meningioma ([place]) 01/03/2019  . Brain tumor ([place]) 12/27/2018  . Diffuse idiopathic skeletal hyperostosis 06/05/2013    Past Surgical History:  Procedure Laterality Date  . APPLICATION OF CRANIAL NAVIGATION N/A  01/10/2019   Procedure: APPLICATION OF CRANIAL NAVIGATION;  Surgeon: Erline Levine, MD;  Location: [facility];  Service: Neurosurgery;  Laterality: N/A;  . CATARACT EXTRACTION Right   . CRANIOTOMY Right 11/06/2012   Procedure: CRANIOTOMY TUMOR EXCISION;  Surgeon: Erline Levine, MD;  Location: [facility] NEURO ORS;  Service: Neurosurgery;  Laterality: Right;  Right Parasagittal craniotomy for meningioma with Stealth  . CRANIOTOMY Right 01/10/2019   Procedure: Right Parasagittal Craniotomy for Tumor;  Surgeon: Erline Levine, MD;  Location: [facility];  Service: Neurosurgery;  Laterality: Right;  Right parasagittal craniotomy for tumor  . ESOPHAGOGASTRODUODENOSCOPY    . right knee arthroscopy    . SHOULDER ARTHROSCOPY Right 06/09/2020   Procedure: RIGHT SHOULDER ARTHROSCOPY AND DEBRIDEMENT;  Surgeon: Newt Minion, MD;  Location: [facility];  Service: Orthopedics;  Laterality: Right;       Family History  Problem Relation Age of Onset  . Hypertension Mother   . Dementia Mother   . Diabetes Mother   . Hypertension Father   . CAD Father   . Diabetes Father   . CAD Brother     Social History   Tobacco Use  . Smoking status: Current Some Day Smoker    Types: Cigarettes    Last attempt to quit: 01/29/2019    Years since quitting: 1.3  . Smokeless tobacco: Never Used  . Tobacco comment:  weekend smoker  Vaping Use  . Vaping Use: Never used  Substance Use Topics  . Alcohol use: Yes    Comment: social  . Drug use: No    Home Medications Prior to Admission medications   Medication Sig Start Date End Date Taking? Authorizing Provider  amLODipine (NORVASC) 5 MG tablet Take 1 tablet (5 mg total) by mouth daily. To lower blood pressure 05/06/20   Fulp, Cammie, MD  aspirin EC 81 MG EC tablet Take 1 tablet (81 mg total) by mouth daily. 01/14/19   Erline Levine, MD  atorvastatin (LIPITOR) 20 MG tablet Take 1 tablet (20 mg total) by mouth daily. 04/30/20   Charlott Rakes, MD  clonazePAM  (KLONOPIN) 0.5 MG tablet TAKE 1 TABLET(0.5 MG) BY MOUTH TWICE DAILY 05/05/20   Vaslow, Acey Lav, MD  Lacosamide 100 MG TABS Take 2 tablets (200 mg total) by mouth in the morning and at bedtime. 06/13/20 07/13/20  Carlisle Cater, PA-C  lamoTRIgine (LAMICTAL) 100 MG tablet TAKE 2 TABLETS (200 MG TOTAL) BY MOUTH 2 (TWO) TIMES DAILY. 04/23/20   Vaslow, Acey Lav, MD  LORazepam (ATIVAN) 1 MG tablet Take 1 tablet (1 mg total) by mouth every 8 (eight) hours as needed for seizure (for breakthrough seizures). 12/26/19   Vaslow, Acey Lav, MD  methocarbamol (ROBAXIN) 500 MG tablet Take 1 tablet (500 mg total) by mouth every 8 (eight) hours as needed for muscle spasms. 04/22/20   Davonna Belling, MD  mirtazapine (REMERON) 15 MG tablet Take 15 mg by mouth at bedtime as needed (Sleep).     [provider]  omeprazole (PRILOSEC) 20 MG capsule One pill twice daily- morning and before bedtime to reduce stomach acid 04/15/20   Fulp, Cammie, MD  ondansetron (ZOFRAN) 4 MG tablet Take 1 tablet (4 mg total) by mouth every 8 (eight) hours as needed for nausea or vomiting. 06/01/20   Fulp, Cammie, MD  ondansetron (ZOFRAN-ODT) 4 MG disintegrating tablet Take 1 tablet (4 mg total) by mouth every 8 (eight) hours as needed for nausea or vomiting. 02/04/20   Vaslow, Acey Lav, MD  oxyCODONE-acetaminophen (PERCOCET) 5-325 MG tablet Take 1 tablet by mouth every 4 (four) hours as needed for severe pain. 06/09/20 06/09/21  Persons, Bevely Palmer, PA  sucralfate (CARAFATE) 1 g tablet Take 1 tablet (1 g total) by mouth 4 (four) times daily -  with meals and at bedtime. 05/06/20   Fulp, Cammie, MD  VIMPAT 100 MG TABS TAKE 2 TABLETS IN MORNING AND 2 AT BEDTIME 05/11/20   Vaslow, Acey Lav, MD    Allergies    Patient has no known allergies.  Review of Systems   Review of Systems  Constitutional: Negative for fever.  Gastrointestinal: Negative for vomiting.  Neurological: Negative for seizures and headaches.    Physical  Exam Updated Vital Signs BP (!) 147/92 (BP Location: Left Arm)   Pulse 99   Temp 98.1 F (36.7 C) (Oral)   Resp 18   SpO2 96%   Physical Exam Vitals and nursing note reviewed.  Constitutional:      Appearance: He is well-developed.  HENT:     Head: Normocephalic and atraumatic.  Eyes:     Conjunctiva/sclera: Conjunctivae normal.  Cardiovascular:     Rate and Rhythm: Normal rate.  Pulmonary:     Effort: No respiratory distress.  Musculoskeletal:     Cervical back: Normal range of motion and neck supple.  Skin:    General: Skin is warm and dry.  Neurological:     General: No focal deficit present.     Mental Status: He is alert.     ED Results / Procedures / Treatments   Labs (all labs ordered are listed, but only abnormal results are displayed) Labs Reviewed - No data to display  EKG None  Radiology CT HEAD WO CONTRAST  Result Date: 06/11/2020 CLINICAL DATA:  Dizziness.  History of para falcine meningioma. EXAM: CT HEAD WITHOUT CONTRAST TECHNIQUE: Contiguous axial images were obtained from the base of the skull through the vertex without intravenous contrast. COMPARISON:  CT head 04/30/2020, MRA head 04/03/2020 FINDINGS: Brain: Similar-appearing high frontoparietal encephalomalacia. Patchy and confluent areas of decreased attenuation are noted throughout the deep and periventricular white matter of the cerebral hemispheres bilaterally, compatible with chronic microvascular ischemic disease. No evidence of large-territorial acute infarction. No parenchymal hemorrhage. No mass lesion. No extra-axial collection. No mass effect or midline shift. No hydrocephalus. Basilar cisterns are patent. Similar-appearing trace dural/falcine thickening of the frontoparietal vertex likely related to postsurgical changes. Vascular: No hyperdense vessel. Skull: Status post burr holes with craniotomy fasteners. No acute fracture or focal lesion. Sinuses/Orbits: Paranasal sinuses and mastoid air  cells are clear. The orbits are unremarkable. Other: None. IMPRESSION: No acute intracranial abnormality in a patient status post parafalcine meningioma resection. Electronically Signed   By: Iven Finn M.D.   On: 06/11/2020 20:02    Procedures Procedures (including critical care time)  Medications Ordered in ED Medications  lacosamide (VIMPAT) tablet 200 mg (has no administration in time range)    ED Course  I have reviewed the triage vital signs and the nursing notes.  Pertinent labs & imaging results that were available during my care of the patient were reviewed by me and considered in my medical decision making (see chart for details).  Patient seen and examined. Will give dose of Vimpat here as it is Saturday during a holiday weekend. 15 day supply given. Encouraged f/u with his doctors.   Vital signs reviewed and are as follows: BP (!) 147/92 (BP Location: Left Arm)   Pulse 99   Temp 98.1 F (36.7 C) (Oral)   Resp 18   SpO2 96%       MDM Rules/Calculators/A&P                          Medication refill provided -- pt encouraged f/u with his neurologist.   Final Clinical Impression(s) / ED Diagnoses Final diagnoses:  Medication refill    Rx / DC Orders ED Discharge Orders         Ordered    Lacosamide 100 MG TABS  2 times daily        06/13/20 1442           Carlisle Cater, PA-C 06/13/20 1446    Tegeler, Gwenyth Allegra, MD 06/13/20 1549

## 2020-06-13 NOTE — ED Triage Notes (Addendum)
Pt states he is only here for medication refill for Vimpat.  States he called [facility] and they told him "the same as last time" to come to emergency room to get it refilled.  Pt seen at [facility] ED for Vimpat refill last month.  Denies any complaints.

## 2020-06-13 NOTE — TOC Initial Note (Addendum)
Transition of Care [facility]) - Initial/Assessment Note    Patient Details  Name: Joshua [name] MRN: [id number] Date of Birth: Aug 02, 1960  Transition of Care [facility] CM/SW Contact:    Verdell Carmine, RN Phone Number: 06/13/2020, 2:07 PM  Clinical Narrative:                 Patient comes to ED often for refills of medications. He states the Cancer center tells him to come to the ED for these refills, he should be seeing his primary care MD for these. Will touch base and send a message out to the cancer center manager as well as the office manager of his PCP to sort this out.   [place] supervisor o make contact with the two entities above.       Patient Goals and CMS Choice        Expected Discharge Plan and Services                                                Prior Living Arrangements/Services                       Activities of Daily Living      Permission Sought/Granted                  Emotional Assessment              Admission diagnosis:  Medication Refill Patient Active Problem List   Diagnosis Date Noted  . Nontraumatic complete tear of right rotator cuff   . Impingement syndrome of right shoulder   . Neuropathy 12/05/2019  . 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 Seizures ([place]) 01/31/2019  . Meningioma, recurrent of brain ([place]) 01/31/2019  . Meningioma ([place]) 01/03/2019  . Brain tumor ([place]) 12/27/2018  . Diffuse idiopathic skeletal hyperostosis 06/05/2013   PCP:  Antony Blackbird, MD Pharmacy:   [id number] - [place], [place] - [address] [place] [address] Phone: [phone] Fax: [phone]     Social Determinants of Health (SDOH) Interventions    Readmission Risk Interventions No flowsheet data found.

## 2020-06-15 ENCOUNTER — Telehealth: Payer: Self-pay

## 2020-06-15 ENCOUNTER — Other Ambulatory Visit: Payer: Self-pay | Admitting: Internal Medicine

## 2020-06-15 MED ORDER — LACOSAMIDE 100 MG PO TABS
200.0000 mg | ORAL_TABLET | Freq: Two times a day (BID) | ORAL | 2 refills | Status: DC
Start: 2020-06-15 — End: 2020-07-31

## 2020-06-15 NOTE — Telephone Encounter (Signed)
I called pt advised that we will d/c stitches at post op appt. Sch pt for appt 06/23/20 9:15

## 2020-06-15 NOTE — Telephone Encounter (Signed)
Patient called in wanting to know what should he do regarding his stiches

## 2020-06-16 ENCOUNTER — Telehealth: Payer: Self-pay | Admitting: *Deleted

## 2020-06-16 NOTE — Telephone Encounter (Signed)
Patient needs Vimpat refilled.  Routed to MD to review and process.

## 2020-06-21 ENCOUNTER — Other Ambulatory Visit: Payer: Self-pay

## 2020-06-21 ENCOUNTER — Encounter: Payer: Self-pay | Admitting: Emergency Medicine

## 2020-06-21 ENCOUNTER — Emergency Department
Admission: EM | Admit: 2020-06-21 | Discharge: 2020-06-22 | Disposition: A | Discharge status: Home / Self Care | Payer: Medicaid Other | Attending: Emergency Medicine | Admitting: Emergency Medicine

## 2020-06-21 DIAGNOSIS — R350 Frequency of micturition: Secondary | ICD-10-CM | POA: Insufficient documentation

## 2020-06-21 DIAGNOSIS — Z5321 Procedure and treatment not carried out due to patient leaving prior to being seen by health care provider: Secondary | ICD-10-CM | POA: Insufficient documentation

## 2020-06-21 LAB — URINALYSIS, COMPLETE (UACMP) WITH MICROSCOPIC
Bacteria, UA: NONE SEEN
Bilirubin Urine: NEGATIVE
Glucose, UA: NEGATIVE mg/dL
Hgb urine dipstick: NEGATIVE
Ketones, ur: NEGATIVE mg/dL
Leukocytes,Ua: NEGATIVE
Nitrite: NEGATIVE
Protein, ur: NEGATIVE mg/dL
Specific Gravity, Urine: 1.017 (ref 1.005–1.030)
Squamous Epithelial / HPF: NONE SEEN (ref 0–5)
pH: 5 (ref 5.0–8.0)

## 2020-06-21 LAB — CBC
HCT: 45.8 % (ref 39.0–52.0)
Hemoglobin: 15.6 g/dL (ref 13.0–17.0)
MCH: 31.4 pg (ref 26.0–34.0)
MCHC: 34.1 g/dL (ref 30.0–36.0)
MCV: 92.2 fL (ref 80.0–100.0)
Platelets: 295 10*3/uL (ref 150–400)
RBC: 4.97 MIL/uL (ref 4.22–5.81)
RDW: 12.6 % (ref 11.5–15.5)
WBC: 8.7 10*3/uL (ref 4.0–10.5)
nRBC: 0 % (ref 0.0–0.2)

## 2020-06-21 LAB — BASIC METABOLIC PANEL
Anion gap: 9 (ref 5–15)
BUN: 18 mg/dL (ref 6–20)
CO2: 24 mmol/L (ref 22–32)
Calcium: 9.8 mg/dL (ref 8.9–10.3)
Chloride: 105 mmol/L (ref 98–111)
Creatinine, Ser: 0.93 mg/dL (ref 0.61–1.24)
GFR, Estimated: >60 mL/min (ref >60)
Glucose, Bld: 119 mg/dL — ABNORMAL HIGH (ref 70–99)
Potassium: 4.6 mmol/L (ref 3.5–5.1)
Sodium: 138 mmol/L (ref 135–145)

## 2020-06-21 LAB — CBG MONITORING, ED: Glucose-Capillary: 106 mg/dL — ABNORMAL HIGH (ref 70–99)

## 2020-06-21 NOTE — ED Triage Notes (Signed)
Pt arrived via POV with reports of excessive urination today, pt states he drank 2 POM drinks today, pt has hx of having prostate checked and has of prostatitis.  Denies any pain at this time.

## 2020-06-22 ENCOUNTER — Other Ambulatory Visit: Payer: Self-pay | Admitting: Internal Medicine

## 2020-06-22 NOTE — ED Notes (Signed)
Patient called for a room with no answer. 

## 2020-06-23 ENCOUNTER — Ambulatory Visit (INDEPENDENT_AMBULATORY_CARE_PROVIDER_SITE_OTHER): Payer: Medicaid Other | Admitting: Family

## 2020-06-23 ENCOUNTER — Encounter: Payer: Self-pay | Admitting: Family

## 2020-06-23 DIAGNOSIS — M7541 Impingement syndrome of right shoulder: Secondary | ICD-10-CM

## 2020-06-23 NOTE — Telephone Encounter (Signed)
Rx refill request

## 2020-06-23 NOTE — Progress Notes (Signed)
Post-Op Visit Note   Patient: Joshua [name]           Date of Birth: [birth year]           MRN: [id number] Visit Date: 06/23/2020 PCP: Antony Blackbird, MD  Chief Complaint:  Chief Complaint  Patient presents with  . Right Shoulder - Routine Post Op    06/09/20 right scope and debridement     HPI:  HPI The patient is a 59 year old gentleman seen status post right shoulder arthroscopy with debridement on November 23 he has a sutures in place today he continues to complain of some stiffness and aching has not been using anything for pain feels he would benefit from physical therapy would like to start this in January  Ortho Exam Right shoulder portals are well-healed sutures harvested today without incident there is no erythema minimal surrounding edema no sign of infection  Visit Diagnoses:  1. Impingement syndrome of right shoulder     Plan: will begin working on pendulum swings and walking arm up wall. Portal healed. Will order PT.   Follow-Up Instructions: Return in about 4 weeks (around 07/21/2020).   Imaging: No results found.  Orders:  Orders Placed This Encounter  Procedures  . Ambulatory referral to Physical Therapy   No orders of the defined types were placed in this encounter.    PMFS History: Patient Active Problem List   Diagnosis Date Noted  . Nontraumatic complete tear of right rotator cuff   . Impingement syndrome of right shoulder   . Neuropathy 12/05/2019  . 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[place]) 03/07/2019  . Seizures ([place]) 01/31/2019  . Meningioma, recurrent of brain ([place]) 01/31/2019  . Meningioma ([place]) 01/03/2019  . Brain tumor ([place]) 12/27/2018  . Diffuse idiopathic skeletal hyperostosis 06/05/2013   Past Medical History:  Diagnosis Date  . Anxiety   . Brain tumor ([place]) 02/20/2013   brain tumor removed in March 2014, Dr Donald Pore  . Diffuse idiopathic skeletal hyperostosis 06/05/2013  . Dizziness   . Enlarged prostate   . GERD  (gastroesophageal reflux disease)   . Headache(784.0)    scattered  . Hypertension   . 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 Meningioma ([place])   . Meningioma, recurrent of brain ([place]) 01/31/2019  . Neuropathy 12/05/2019  . Rotator cuff tear    right  . Seizures ([place])    11/27/19 last sz 1 wk ago    Family History  Problem Relation Age of Onset  . Hypertension Mother   . Dementia Mother   . Diabetes Mother   . Hypertension Father   . CAD Father   . Diabetes Father   . CAD Brother     Past Surgical History:  Procedure Laterality Date  . APPLICATION OF CRANIAL NAVIGATION N/A 01/10/2019   Procedure: APPLICATION OF CRANIAL NAVIGATION;  Surgeon: Erline Levine, MD;  Location: [facility];  Service: Neurosurgery;  Laterality: N/A;  . CATARACT EXTRACTION Right   . CRANIOTOMY Right 11/06/2012   Procedure: CRANIOTOMY TUMOR EXCISION;  Surgeon: Erline Levine, MD;  Location: [facility];  Service: Neurosurgery;  Laterality: Right;  Right Parasagittal craniotomy for meningioma with Stealth  . CRANIOTOMY Right 01/10/2019   Procedure: Right Parasagittal Craniotomy for Tumor;  Surgeon: Erline Levine, MD;  Location: [facility];  Service: Neurosurgery;  Laterality: Right;  Right parasagittal craniotomy for tumor  . ESOPHAGOGASTRODUODENOSCOPY    . right knee arthroscopy    . SHOULDER ARTHROSCOPY Right  06/09/2020   Procedure: RIGHT SHOULDER ARTHROSCOPY AND DEBRIDEMENT;  Surgeon: Newt Minion, MD;  Location: [facility];  Service: Orthopedics;  Laterality: Right;   Social History   Occupational History  . Not on file  Tobacco Use  . Smoking status: Current Some Day Smoker    Types: Cigarettes    Last attempt to quit: 01/29/2019    Years since quitting: 1.4  . Smokeless tobacco: Never Used  . Tobacco comment: weekend smoker  Vaping Use  . Vaping Use: Never used  Substance and Sexual Activity  . Alcohol use: Yes    Comment: social  . Drug use: No  . Sexual activity:  Not Currently

## 2020-06-24 DIAGNOSIS — Z1159 Encounter for screening for other viral diseases: Secondary | ICD-10-CM | POA: Diagnosis not present

## 2020-06-25 ENCOUNTER — Encounter: Payer: Medicaid Other | Admitting: Family Medicine

## 2020-06-25 DIAGNOSIS — Z4802 Encounter for removal of sutures: Secondary | ICD-10-CM | POA: Diagnosis not present

## 2020-06-28 ENCOUNTER — Other Ambulatory Visit: Payer: Self-pay | Admitting: Internal Medicine

## 2020-06-28 ENCOUNTER — Other Ambulatory Visit: Payer: Self-pay | Admitting: Family Medicine

## 2020-06-28 DIAGNOSIS — I700 Atherosclerosis of aorta: Secondary | ICD-10-CM

## 2020-06-29 DIAGNOSIS — K5190 Ulcerative colitis, unspecified, without complications: Secondary | ICD-10-CM | POA: Diagnosis not present

## 2020-06-29 DIAGNOSIS — K644 Residual hemorrhoidal skin tags: Secondary | ICD-10-CM | POA: Diagnosis not present

## 2020-06-29 DIAGNOSIS — K635 Polyp of colon: Secondary | ICD-10-CM | POA: Diagnosis not present

## 2020-06-30 ENCOUNTER — Encounter: Payer: Self-pay | Admitting: Emergency Medicine

## 2020-06-30 ENCOUNTER — Other Ambulatory Visit: Payer: Self-pay

## 2020-06-30 ENCOUNTER — Emergency Department
Admission: EM | Admit: 2020-06-30 | Discharge: 2020-06-30 | Disposition: A | Discharge status: Home / Self Care | Payer: Medicaid Other | Attending: Emergency Medicine | Admitting: Emergency Medicine

## 2020-06-30 ENCOUNTER — Telehealth: Payer: Self-pay

## 2020-06-30 DIAGNOSIS — Z79899 Other long term (current) drug therapy: Secondary | ICD-10-CM | POA: Diagnosis not present

## 2020-06-30 DIAGNOSIS — Z85841 Personal history of malignant neoplasm of brain: Secondary | ICD-10-CM | POA: Diagnosis not present

## 2020-06-30 DIAGNOSIS — Z7982 Long term (current) use of aspirin: Secondary | ICD-10-CM | POA: Diagnosis not present

## 2020-06-30 DIAGNOSIS — I10 Essential (primary) hypertension: Secondary | ICD-10-CM | POA: Insufficient documentation

## 2020-06-30 DIAGNOSIS — F17210 Nicotine dependence, cigarettes, uncomplicated: Secondary | ICD-10-CM | POA: Diagnosis not present

## 2020-06-30 NOTE — ED Provider Notes (Signed)
[facility] Emergency Department Provider Note  ____________________________________________  Time seen: Approximately 8:02 PM  I have reviewed the triage vital signs and the nursing notes.   HISTORY  Chief Complaint Hypertension    HPI Joshua [name] is a 59 y.o. male who presents the emergency department complaining of hypertension.  Patient states that he is on Norvasc, 5 mg daily.  He was not sure if he took his blood pressure medication earlier today so he checked his blood pressure.  He states that it was 140/92.  Patient states that his bottom number has never been that high and he was scared so he took a dose of Norvasc.  He is not sure if this was his first or second dose of the day.  Patient has no symptoms currently.  He wanted to be assessed to ensure that his blood pressure was not "too high."         Past Medical History:  Diagnosis Date  . Anxiety   . Brain tumor ([place]) 02/20/2013   brain tumor removed in March 2014, Dr Donald Pore  . Diffuse idiopathic skeletal hyperostosis 06/05/2013  . Dizziness   . Enlarged prostate   . GERD (gastroesophageal reflux disease)   . Headache(784.0)    scattered  . Hypertension   . 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 Meningioma ([place])   . Meningioma, recurrent of brain ([place]) 01/31/2019  . Neuropathy 12/05/2019  . Rotator cuff tear    right  . Seizures ([place])    11/27/19 last sz 1 wk ago    Patient Active Problem List   Diagnosis Date Noted  . Nontraumatic complete tear of right rotator cuff   . Impingement syndrome of right shoulder   . Neuropathy 12/05/2019  . 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[place]) 03/07/2019  . Seizures ([place]) 01/31/2019  . Meningioma, recurrent of brain ([place]) 01/31/2019  . Meningioma ([place]) 01/03/2019  . Brain tumor ([place]) 12/27/2018  . Diffuse idiopathic skeletal hyperostosis 06/05/2013    Past Surgical History:  Procedure Laterality Date  .  APPLICATION OF CRANIAL NAVIGATION N/A 01/10/2019   Procedure: APPLICATION OF CRANIAL NAVIGATION;  Surgeon: Erline Levine, MD;  Location: [facility];  Service: Neurosurgery;  Laterality: N/A;  . CATARACT EXTRACTION Right   . CRANIOTOMY Right 11/06/2012   Procedure: CRANIOTOMY TUMOR EXCISION;  Surgeon: Erline Levine, MD;  Location: [facility];  Service: Neurosurgery;  Laterality: Right;  Right Parasagittal craniotomy for meningioma with Stealth  . CRANIOTOMY Right 01/10/2019   Procedure: Right Parasagittal Craniotomy for Tumor;  Surgeon: Erline Levine, MD;  Location: [facility];  Service: Neurosurgery;  Laterality: Right;  Right parasagittal craniotomy for tumor  . ESOPHAGOGASTRODUODENOSCOPY    . right knee arthroscopy    . SHOULDER ARTHROSCOPY Right 06/09/2020   Procedure: RIGHT SHOULDER ARTHROSCOPY AND DEBRIDEMENT;  Surgeon: Newt Minion, MD;  Location: [facility];  Service: Orthopedics;  Laterality: Right;    Prior to Admission medications   Medication Sig Start Date End Date Taking? Authorizing Provider  amLODipine (NORVASC) 5 MG tablet Take 1 tablet (5 mg total) by mouth daily. To lower blood pressure 05/06/20   Fulp, Cammie, MD  aspirin EC 81 MG EC tablet Take 1 tablet (81 mg total) by mouth daily. 01/14/19   Erline Levine, MD  atorvastatin (LIPITOR) 20 MG tablet TAKE 1 TABLET BY MOUTH EVERY DAY 06/29/20   Charlott Rakes, MD  clonazePAM (KLONOPIN) 0.5 MG tablet TAKE 1 TABLET(0.5 MG)  BY MOUTH TWICE DAILY 05/05/20   Vaslow, Acey Lav, MD  Lacosamide 100 MG TABS Take 2 tablets (200 mg total) by mouth in the morning and at bedtime. 06/15/20   [name], MD  lamoTRIgine (LAMICTAL) 100 MG tablet TAKE 2 TABLETS (200 MG TOTAL) BY MOUTH 2 (TWO) TIMES DAILY. 06/29/20   Vaslow, Acey Lav, MD  LORazepam (ATIVAN) 1 MG tablet Take 1 tablet (1 mg total) by mouth every 8 (eight) hours as needed for seizure (for breakthrough seizures). 12/26/19   Vaslow, Acey Lav, MD  methocarbamol (ROBAXIN)  500 MG tablet Take 1 tablet (500 mg total) by mouth every 8 (eight) hours as needed for muscle spasms. 04/22/20   Davonna Belling, MD  mirtazapine (REMERON) 15 MG tablet Take 15 mg by mouth at bedtime as needed (Sleep).     [provider]  omeprazole (PRILOSEC) 20 MG capsule One pill twice daily- morning and before bedtime to reduce stomach acid 04/15/20   Fulp, Cammie, MD  ondansetron (ZOFRAN) 4 MG tablet Take 1 tablet (4 mg total) by mouth every 8 (eight) hours as needed for nausea or vomiting. 06/01/20   Fulp, Cammie, MD  ondansetron (ZOFRAN-ODT) 4 MG disintegrating tablet Take 1 tablet (4 mg total) by mouth every 8 (eight) hours as needed for nausea or vomiting. 02/04/20   Vaslow, Acey Lav, MD  oxyCODONE-acetaminophen (PERCOCET) 5-325 MG tablet Take 1 tablet by mouth every 4 (four) hours as needed for severe pain. 06/09/20 06/09/21  Persons, Bevely Palmer, PA  sucralfate (CARAFATE) 1 g tablet Take 1 tablet (1 g total) by mouth 4 (four) times daily -  with meals and at bedtime. 05/06/20   Fulp, Cammie, MD  VIMPAT 100 MG TABS TAKE 2 TABLETS IN MORNING AND 2 AT BEDTIME 05/11/20   Vaslow, Acey Lav, MD    Allergies Patient has no known allergies.  Family History  Problem Relation Age of Onset  . Hypertension Mother   . Dementia Mother   . Diabetes Mother   . Hypertension Father   . CAD Father   . Diabetes Father   . CAD Brother     Social History Social History   Tobacco Use  . Smoking status: Current Some Day Smoker    Types: Cigarettes    Last attempt to quit: 01/29/2019    Years since quitting: 1.4  . Smokeless tobacco: Never Used  . Tobacco comment: weekend smoker  Vaping Use  . Vaping Use: Never used  Substance Use Topics  . Alcohol use: Yes    Comment: social  . Drug use: No     Review of Systems  Constitutional: No fever/chills Eyes: No visual changes. No discharge ENT: No upper respiratory complaints. Cardiovascular: no chest pain. Respiratory: no cough.  No SOB. Gastrointestinal: No abdominal pain.  No nausea, no vomiting.  No diarrhea.  No constipation. Musculoskeletal: Negative for musculoskeletal pain. Skin: Negative for rash, abrasions, lacerations, ecchymosis. Neurological: Negative for headaches, focal weakness or numbness.  10 System ROS otherwise negative.  ____________________________________________   PHYSICAL EXAM:  VITAL SIGNS: ED Triage Vitals [06/30/20 1651]  Enc Vitals Group     BP 135/78     Pulse Rate 95     Resp 16     Temp 97.8 F (36.6 C)     Temp Source Oral     SpO2 98 %     Weight 179 lb 14.3 oz (81.6 kg)     Height 5\' 8"  (1.727 m)  Head Circumference      Peak Flow      Pain Score      Pain Loc      Pain Edu?      Excl. in [place]?      Constitutional: Alert and oriented. Well appearing and in no acute distress. Eyes: Conjunctivae are normal. PERRL. EOMI. Head: Atraumatic. ENT:      Ears:       Nose: No congestion/rhinnorhea.      Mouth/Throat: Mucous membranes are moist.  Neck: No stridor.    Cardiovascular: Normal rate, regular rhythm. Normal S1 and S2.  Good peripheral circulation. Respiratory: Normal respiratory effort without tachypnea or retractions. Lungs CTAB. Good air entry to the bases with no decreased or absent breath sounds. Musculoskeletal: Full range of motion to all extremities. No gross deformities appreciated. Neurologic:  Normal speech and language. No gross focal neurologic deficits are appreciated.  Skin:  Skin is warm, dry and intact. No rash noted. Psychiatric: Mood and affect are normal. Speech and behavior are normal. Patient exhibits appropriate insight and judgement.   ____________________________________________   LABS (all labs ordered are listed, but only abnormal results are displayed)  Labs Reviewed - No data to display ____________________________________________  EKG   ____________________________________________  RADIOLOGY   No results  found.  ____________________________________________    PROCEDURES  Procedure(s) performed:    Procedures    Medications - No data to display   ____________________________________________   INITIAL IMPRESSION / ASSESSMENT AND PLAN / ED COURSE  Pertinent labs & imaging results that were available during my care of the patient were reviewed by me and considered in my medical decision making (see chart for details).  Review of the [place] CSRS was performed in accordance of the [place] prior to dispensing any controlled drugs.           Patient's diagnosis is consistent with hypertension.  Patient presented to the emergency department complaining of a blood pressure reading of 140/92 earlier today.  Patient was not sure whether he had taken his blood pressure medication for the day or not so he took a dose.  Patient arrived with a blood pressure of 135/78.  He has no complaints at this time.  Reassured the patient at this time that even with a reading of 140/92 I was not concerned about his hypertension especially as he has no other symptoms.  Patient is instructed to keep daily recordings of his blood pressure to discuss with his primary care in 2 weeks at his regularly scheduled appointment.  If his blood pressure remains elevated they can adjust the dosing at that time.  No indication for dosing adjustment currently or addition of further antihypertensives.  No indication for labs or imaging.  Follow-up primary care at his regularly scheduled appointment..  Patient is given ED precautions to return to the ED for any worsening or new symptoms.     ____________________________________________  FINAL CLINICAL IMPRESSION(S) / ED DIAGNOSES  Final diagnoses:  Primary hypertension      NEW MEDICATIONS STARTED DURING THIS VISIT:  ED Discharge Orders    None          This chart was dictated using voice recognition software/Dragon. Despite best efforts to proofread, errors  can occur which can change the meaning. Any change was purely unintentional.    Darletta Moll, PA-C 06/30/20 2022    Nena Polio, MD 07/01/20 [phone]

## 2020-06-30 NOTE — ED Triage Notes (Addendum)
C/O Hypertension today.  STates BP 140/92.  Took an extra blood pressure pill.  Said had some dizziness initially, but denies all complaint now.  States he feels anxious at home about blood pressure.  Does not have a BP machine at home.  AAOx3.  Skin warm and dry. NAD

## 2020-06-30 NOTE — Telephone Encounter (Signed)
Copied from [place] [id number]. Topic: General - Other >> Jun 29, 2020 12:56 PM Celene Kras wrote: Reason for CRM: Pt called and is requesting to speak with nurse regarding his recent appt with GI doctor. He states that he is needing to have a referral to a prostate doctor as advised by GI doctor. Please advise.  One of Fulp's former patients. Please follow up.

## 2020-07-01 ENCOUNTER — Other Ambulatory Visit (HOSPITAL_COMMUNITY)
Admission: RE | Admit: 2020-07-01 | Discharge: 2020-07-01 | Disposition: A | Discharge status: Home / Self Care | Payer: Medicaid Other | Source: Ambulatory Visit | Attending: Critical Care Medicine | Admitting: Critical Care Medicine

## 2020-07-01 ENCOUNTER — Other Ambulatory Visit: Payer: Self-pay

## 2020-07-01 ENCOUNTER — Ambulatory Visit: Payer: Medicaid Other | Admitting: Physician Assistant

## 2020-07-01 VITALS — BP 135/79 | HR 84 | Temp 98.7°F | Resp 18 | Ht 68.0 in | Wt 196.0 lb

## 2020-07-01 DIAGNOSIS — R3589 Other polyuria: Secondary | ICD-10-CM | POA: Diagnosis not present

## 2020-07-01 DIAGNOSIS — R7303 Prediabetes: Secondary | ICD-10-CM | POA: Diagnosis not present

## 2020-07-01 DIAGNOSIS — I10 Essential (primary) hypertension: Secondary | ICD-10-CM | POA: Diagnosis not present

## 2020-07-01 DIAGNOSIS — R300 Dysuria: Secondary | ICD-10-CM

## 2020-07-01 DIAGNOSIS — R360 Urethral discharge without blood: Secondary | ICD-10-CM | POA: Insufficient documentation

## 2020-07-01 LAB — POCT URINALYSIS DIP (CLINITEK)
Bilirubin, UA: NEGATIVE
Blood, UA: NEGATIVE
Glucose, UA: NEGATIVE mg/dL
Ketones, POC UA: NEGATIVE mg/dL
Leukocytes, UA: NEGATIVE
Nitrite, UA: NEGATIVE
POC PROTEIN,UA: 30 — AB
Spec Grav, UA: >=1.03 — AB (ref 1.010–1.025)
Urobilinogen, UA: 0.2 E.U./dL
pH, UA: 5.5 (ref 5.0–8.0)

## 2020-07-01 LAB — POCT GLYCOSYLATED HEMOGLOBIN (HGB A1C): Hemoglobin A1C: 6.2 % — AB (ref 4.0–5.6)

## 2020-07-01 MED ORDER — BD LANCET ULTRAFINE 30G MISC: 1.0000 | Freq: Two times a day (BID) | 11 refills | Status: DC

## 2020-07-01 MED ORDER — TRUE METRIX METER W/DEVICE KIT: 1.0000 [IU] | PACK | Freq: Every day | 0 refills | Status: AC

## 2020-07-01 MED ORDER — METFORMIN HCL 500 MG PO TABS: 500.0000 mg | ORAL_TABLET | Freq: Two times a day (BID) | ORAL | 3 refills | Status: DC

## 2020-07-01 MED ORDER — TRUE METRIX BLOOD GLUCOSE TEST VI STRP: ORAL_STRIP | 12 refills | Status: DC

## 2020-07-01 NOTE — Progress Notes (Signed)
Established Patient Office Visit  Subjective:  Patient ID: Joshua [name], male    DOB: 1960-10-23  Age: 59 y.o. MRN: [id number]  CC:  Chief Complaint  Patient presents with  . Hypertension    HPI JOSIAN LANESE reports that he presented to the emergency department yesterday, was concerned that his blood pressure was too high and was unsure if he had taken too much blood pressure medication because he took a second dose.  States that he was not able to remember if he had taken his dose earlier that day.  Will check it at [place]  On occasion , reports reading of approx  135/80  Has been eating a lower sodium diet  Reports he has been having increased urination, more at night, several nighttime awakenings.  Reports approximately a month ago he was experiencing a burning sensation and occasional discharge.  Reports that he was under dental care, and had taken a course of amoxicillin, states that it resolved, but did resume 2 weeks ago.  States he has very low suspicion for sexually transmitted disease, states he has not been sexually active in the past year.    Past Medical History:  Diagnosis Date  . Anxiety   . Brain tumor ([place]) 02/20/2013   brain tumor removed in March 2014, Dr Donald Pore  . Diffuse idiopathic skeletal hyperostosis 06/05/2013  . Dizziness   . Enlarged prostate   . GERD (gastroesophageal reflux disease)   . Headache(784.0)    scattered  . Hypertension   . 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 Meningioma ([place])   . Meningioma, recurrent of brain ([place]) 01/31/2019  . Neuropathy 12/05/2019  . Rotator cuff tear    right  . Seizures ([place])    11/27/19 last sz 1 wk ago    Past Surgical History:  Procedure Laterality Date  . APPLICATION OF CRANIAL NAVIGATION N/A 01/10/2019   Procedure: APPLICATION OF CRANIAL NAVIGATION;  Surgeon: Erline Levine, MD;  Location: [facility];  Service: Neurosurgery;  Laterality: N/A;  . CATARACT EXTRACTION Right   .  CRANIOTOMY Right 11/06/2012   Procedure: CRANIOTOMY TUMOR EXCISION;  Surgeon: Erline Levine, MD;  Location: [facility];  Service: Neurosurgery;  Laterality: Right;  Right Parasagittal craniotomy for meningioma with [facility]  . CRANIOTOMY Right 01/10/2019   Procedure: Right Parasagittal Craniotomy for Tumor;  Surgeon: Erline Levine, MD;  Location: [facility];  Service: Neurosurgery;  Laterality: Right;  Right parasagittal craniotomy for tumor  . ESOPHAGOGASTRODUODENOSCOPY    . right knee arthroscopy    . SHOULDER ARTHROSCOPY Right 06/09/2020   Procedure: RIGHT SHOULDER ARTHROSCOPY AND DEBRIDEMENT;  Surgeon: Newt Minion, MD;  Location: [facility];  Service: Orthopedics;  Laterality: Right;    Family History  Problem Relation Age of Onset  . Hypertension Mother   . Dementia Mother   . Diabetes Mother   . Hypertension Father   . CAD Father   . Diabetes Father   . CAD Brother     Social History   Socioeconomic History  . Marital status: Legally Separated    Spouse name: Not on file  . Number of children: 2  . Years of education: Not on file  . Highest education level: Not on file  Occupational History  . Not on file  Tobacco Use  . Smoking status: Current Some Day Smoker    Types: Cigarettes    Last attempt to quit: 01/29/2019    Years since quitting: 1.4  .  Smokeless tobacco: Never Used  . Tobacco comment: weekend smoker  Vaping Use  . Vaping Use: Never used  Substance and Sexual Activity  . Alcohol use: Yes    Comment: social  . Drug use: No  . Sexual activity: Not Currently  Other Topics Concern  . Not on file  Social History Narrative      Caffeine- soda occass   Social Determinants of Health   Financial Resource Strain: Not on file  Food Insecurity: Not on file  Transportation Needs: Not on file  Physical Activity: Not on file  Stress: Not on file  Social Connections: Not on file  Intimate Partner Violence: Not on file    Outpatient Medications  Prior to Visit  Medication Sig Dispense Refill  . amLODipine (NORVASC) 5 MG tablet Take 1 tablet (5 mg total) by mouth daily. To lower blood pressure 30 tablet 4  . atorvastatin (LIPITOR) 20 MG tablet TAKE 1 TABLET BY MOUTH EVERY DAY 30 tablet 0  . clonazePAM (KLONOPIN) 0.5 MG tablet TAKE 1 TABLET(0.5 MG) BY MOUTH TWICE DAILY 60 tablet 3  . Lacosamide 100 MG TABS Take 2 tablets (200 mg total) by mouth in the morning and at bedtime. 120 tablet 2  . lamoTRIgine (LAMICTAL) 100 MG tablet TAKE 2 TABLETS (200 MG TOTAL) BY MOUTH 2 (TWO) TIMES DAILY. 120 tablet 0  . methocarbamol (ROBAXIN) 500 MG tablet Take 1 tablet (500 mg total) by mouth every 8 (eight) hours as needed for muscle spasms. 8 tablet 0  . mirtazapine (REMERON) 15 MG tablet Take 15 mg by mouth at bedtime as needed (Sleep).     Marland Kitchen omeprazole (PRILOSEC) 20 MG capsule One pill twice daily- morning and before bedtime to reduce stomach acid 180 capsule 1  . ondansetron (ZOFRAN) 4 MG tablet Take 1 tablet (4 mg total) by mouth every 8 (eight) hours as needed for nausea or vomiting. 20 tablet 0  . sucralfate (CARAFATE) 1 g tablet Take 1 tablet (1 g total) by mouth 4 (four) times daily -  with meals and at bedtime. 120 tablet 2  . VIMPAT 100 MG TABS TAKE 2 TABLETS IN MORNING AND 2 AT BEDTIME 60 tablet 1  . aspirin EC 81 MG EC tablet Take 1 tablet (81 mg total) by mouth daily. 30 tablet 3  . ACETAMINOPHEN EXTRA STRENGTH 500 MG tablet Take by mouth.    Marland Kitchen aspirin (ASPIRIN 81) 81 MG chewable tablet     . LORazepam (ATIVAN) 1 MG tablet Take 1 tablet (1 mg total) by mouth every 8 (eight) hours as needed for seizure (for breakthrough seizures). (Patient not taking: Reported on 07/01/2020) 4 tablet 0  . ondansetron (ZOFRAN-ODT) 4 MG disintegrating tablet Take 1 tablet (4 mg total) by mouth every 8 (eight) hours as needed for nausea or vomiting. (Patient not taking: Reported on 07/01/2020) 30 tablet 3  . oxyCODONE-acetaminophen (PERCOCET) 5-325 MG tablet Take  1 tablet by mouth every 4 (four) hours as needed for severe pain. (Patient not taking: Reported on 07/01/2020) 30 tablet 0   No facility-administered medications prior to visit.    No Known Allergies  ROS Review of Systems  Constitutional: Negative.   HENT: Negative.   Eyes: Negative.   Respiratory: Negative.   Cardiovascular: Negative.   Gastrointestinal: Negative.   Endocrine: Positive for polyuria.  Genitourinary: Positive for dysuria, frequency and penile discharge. Negative for hematuria and penile pain.  Musculoskeletal: Negative.   Skin: Negative.   Allergic/Immunologic: Negative.   Neurological:  Negative.   Hematological: Negative.       Objective:    Physical Exam Vitals and nursing note reviewed.   BP 135/79 (BP Location: Left Arm, Patient Position: Sitting, Cuff Size: Normal)   Pulse 84   Temp 98.7 F (37.1 C) (Oral)   Resp 18   Ht 5' 8"  (1.727 m)   Wt 196 lb (88.9 kg)   SpO2 94%   BMI 29.80 kg/m   General Appearance:    Alert, cooperative, no distress, appears stated age  Head:    Normocephalic, without obvious abnormality, atraumatic  Eyes:    PERRL, conjunctiva/corneas clear, EOM's intact, fundi    benign, both eyes       Ears:    Normal TM's and external ear canals, both ears  Nose:   Nares normal, septum midline, mucosa normal, no drainage   or sinus tenderness  Throat:   Lips, mucosa, and tongue normal; teeth and gums normal  Neck:   Supple, symmetrical, trachea midline, no adenopathy;       thyroid:  No enlargement/tenderness/nodules; no carotid   bruit or JVD  Back:     Symmetric, no curvature, ROM normal, no CVA tenderness  Lungs:     Clear to auscultation bilaterally, respirations unlabored  Chest wall:    No tenderness or deformity  Heart:    Regular rate and rhythm, S1 and S2 normal, no murmur, rub   or gallop  Abdomen:     Soft, non-tender, bowel sounds active all four quadrants,    no masses, no organomegaly        Extremities:    Extremities normal, atraumatic, no cyanosis or edema  Pulses:   2+ and symmetric all extremities  Skin:   Skin color, texture, turgor normal, no rashes or lesions  Lymph nodes:   Cervical, supraclavicular, and axillary nodes normal  Neurologic:   CNII-XII intact. Normal strength, sensation and reflexes      throughout     BP 135/79 (BP Location: Left Arm, Patient Position: Sitting, Cuff Size: Normal)   Pulse 84   Temp 98.7 F (37.1 C) (Oral)   Resp 18   Ht 5' 8"  (1.727 m)   Wt 196 lb (88.9 kg)   SpO2 94%   BMI 29.80 kg/m  Wt Readings from Last 3 Encounters:  07/01/20 196 lb (88.9 kg)  06/30/20 179 lb 14.3 oz (81.6 kg)  06/11/20 180 lb (81.6 kg)     Health Maintenance Due  Topic Date Due  . TETANUS/TDAP  Never done  . COLONOSCOPY  Never done    There are no preventive care reminders to display for this patient.  Lab Results  Component Value Date   TSH 3.358 04/23/2020   Lab Results  Component Value Date   WBC 8.7 06/21/2020   HGB 15.6 06/21/2020   HCT 45.8 06/21/2020   MCV 92.2 06/21/2020   PLT 295 06/21/2020   Lab Results  Component Value Date   NA 138 06/21/2020   K 4.6 06/21/2020   CO2 24 06/21/2020   GLUCOSE 119 (H) 06/21/2020   BUN 18 06/21/2020   CREATININE 0.93 06/21/2020   BILITOT 0.5 04/25/2020   ALKPHOS 62 04/25/2020   AST 17 04/25/2020   ALT 28 04/25/2020   PROT 7.2 04/25/2020   ALBUMIN 4.2 04/25/2020   CALCIUM 9.8 06/21/2020   ANIONGAP 9 06/21/2020   No results found for: CHOL No results found for: HDL No results found for: LDLCALC No results found for:  TRIG No results found for: CHOLHDL Lab Results  Component Value Date   HGBA1C 6.2 (A) 07/01/2020      Assessment & Plan:   Problem List Items Addressed This Visit   None   Visit Diagnoses    Essential hypertension    -  Primary   Relevant Medications   aspirin (ASPIRIN 81) 81 MG chewable tablet   Polyuria       Relevant Orders   POCT URINALYSIS DIP (CLINITEK) (Completed)    Urine Culture   Dysuria       Penile discharge, without blood       Relevant Orders   Urine cytology ancillary only   Prediabetes       Relevant Medications   metFORMIN (GLUCOPHAGE) 500 MG tablet   Blood Glucose Monitoring Suppl (TRUE METRIX METER) w/Device KIT   glucose blood (TRUE METRIX BLOOD GLUCOSE TEST) test strip   Lancets (BD LANCET ULTRAFINE 30G) MISC   Other Relevant Orders   HgB A1c (Completed)    1. Essential hypertension On review of medical record, patient was encouraged to add losartan to her regimen at last office visit with primary care provider on June 01, 2020.  Patient wants to obtain blood pressure cuff to use at home, encouraged patient to check blood pressures on a daily basis, keep a written log to next office visit, patient has appointment with Freeman Caldron, PA on December 22  2. Polyuria UA was negative for glucose, UTI - POCT URINALYSIS DIP (CLINITEK) - Urine Culture  3. Dysuria   4. Penile discharge, without blood  - Urine cytology ancillary only  5. Prediabetes Patient's A1c is 6.2 today, patient agreeable to trial Metformin 500 mg once daily, patient education given on low sugar diabetic diet, encourage patient to check blood glucose levels on a daily basis, keep a written log and bring to next office visit. - HgB A1c - metFORMIN (GLUCOPHAGE) 500 MG tablet; Take 1 tablet (500 mg total) by mouth 2 (two) times daily with a meal.  Dispense: 180 tablet; Refill: 3 - Blood Glucose Monitoring Suppl (TRUE METRIX METER) w/Device KIT; 1 Units by Does not apply route daily.  Dispense: 1 kit; Refill: 0 - glucose blood (TRUE METRIX BLOOD GLUCOSE TEST) test strip; Use as instructed  Dispense: 100 each; Refill: 12 - Lancets (BD LANCET ULTRAFINE 30G) MISC; 1 each by Does not apply route in the morning and at bedtime.  Dispense: 100 each; Refill: 11   Meds ordered this encounter  Medications  . metFORMIN (GLUCOPHAGE) 500 MG tablet    Sig: Take 1 tablet  (500 mg total) by mouth 2 (two) times daily with a meal.    Dispense:  180 tablet    Refill:  3    Order Specific Question:   Supervising Provider    Answer:   Asencion Noble E [[id number]]  . Blood Glucose Monitoring Suppl (TRUE METRIX METER) w/Device KIT    Sig: 1 Units by Does not apply route daily.    Dispense:  1 kit    Refill:  0    Order Specific Question:   Supervising Provider    Answer:   Joya Gaskins, PATRICK E [[id number]]  . glucose blood (TRUE METRIX BLOOD GLUCOSE TEST) test strip    Sig: Use as instructed    Dispense:  100 each    Refill:  12    Order Specific Question:   Supervising Provider    Answer:   Asencion Noble E [[id number]]  .  Lancets (BD LANCET ULTRAFINE 30G) MISC    Sig: 1 each by Does not apply route in the morning and at bedtime.    Dispense:  100 each    Refill:  11    Order Specific Question:   Supervising Provider    Answer:   Noralyn Pick    I have reviewed the patient's medical history (PMH, PSH, Social History, Family History, Medications, and allergies) , and have been updated if relevant. I spent 30 minutes reviewing chart and  face to face time with patient.    Follow-up: No follow-ups on file.    Loraine Grip Caly Pellum, PA-C

## 2020-07-01 NOTE — Progress Notes (Signed)
Patient has eaten and taken medication today Patient complains of right shoulder pain from having surgery last week described as soreness. Patient experienced a HA and dizziness on yesterday.

## 2020-07-01 NOTE — Patient Instructions (Addendum)
I encourage you to check your blood pressure at home on a daily basis, keep a written log and bring it with you to all office visits.  Your A1c today is 6.2.  This is considered prediabetes, you will take Metformin 500 mg once a day with breakfast.  I encourage you to check your blood glucose levels on a daily basis as well, keep a written log and bring that with you to your office visits.  We will call you with the rest of your lab results.  Kennieth Rad, PA-C Physician Assistant [place] [email or web address]    Blood Pressure Record Sheet To take your blood pressure, you will need a blood pressure machine. You can buy a blood pressure machine (blood pressure monitor) at your clinic, drug store, or online. When choosing one, consider:  An automatic monitor that has an arm cuff.  A cuff that wraps snugly around your upper arm. You should be able to fit only one finger between your arm and the cuff.  A device that stores blood pressure reading results.  Do not choose a monitor that measures your blood pressure from your wrist or finger. Follow your health care provider's instructions for how to take your blood pressure. To use this form:  Get one reading in the morning (a.m.) before you take any medicines.  Get one reading in the evening (p.m.) before supper.  Take at least 2 readings with each blood pressure check. This makes sure the results are correct. Wait 1-2 minutes between measurements.  Write down the results in the spaces on this form.  Repeat this once a week, or as told by your health care provider.  Make a follow-up appointment with your health care provider to discuss the results. Blood pressure log Date: _______________________  a.m. _____________________(1st reading) _____________________(2nd reading)  p.m. _____________________(1st reading) _____________________(2nd reading) Date:  _______________________  a.m. _____________________(1st reading) _____________________(2nd reading)  p.m. _____________________(1st reading) _____________________(2nd reading) Date: _______________________  a.m. _____________________(1st reading) _____________________(2nd reading)  p.m. _____________________(1st reading) _____________________(2nd reading) Date: _______________________  a.m. _____________________(1st reading) _____________________(2nd reading)  p.m. _____________________(1st reading) _____________________(2nd reading) Date: _______________________  a.m. _____________________(1st reading) _____________________(2nd reading)  p.m. _____________________(1st reading) _____________________(2nd reading) This information is not intended to replace advice given to you by your health care provider. Make sure you discuss any questions you have with your health care provider. Document Revised: 09/01/2017 Document Reviewed: 07/04/2017   Elsevier Patient Education  2020 Elsevier I    Preventing Type 2 Diabetes Mellitus Type 2 diabetes (type 2 diabetes mellitus) is a long-term (chronic) disease that affects blood sugar (glucose) levels. Normally, a hormone called insulin allows glucose to enter cells in the body. The cells use glucose for energy. In type 2 diabetes, one or both of these problems may be present:  The body does not make enough insulin.  The body does not respond properly to insulin that it makes (insulin resistance). Insulin resistance or lack of insulin causes excess glucose to build up in the blood instead of going into cells. As a result, high blood glucose (hyperglycemia) develops, which can cause many complications. Being overweight or obese and having an inactive (sedentary) lifestyle can increase your risk for diabetes. Type 2 diabetes can be delayed or prevented by making certain nutrition and lifestyle changes. What nutrition changes can be made?   Eat  healthy meals and snacks regularly. Keep a healthy snack with you for when you get hungry between meals, such as fruit or a handful of  nuts.  Eat lean meats and proteins that are low in saturated fats, such as chicken, fish, egg whites, and beans. Avoid processed meats.  Eat plenty of fruits and vegetables and plenty of grains that have not been processed (whole grains). It is recommended that you eat: ? 1?2 cups of fruit every day. ? 2?3 cups of vegetables every day. ? 6?8 oz of whole grains every day, such as oats, whole wheat, bulgur, brown rice, quinoa, and millet.  Eat low-fat dairy products, such as milk, yogurt, and cheese.  Eat foods that contain healthy fats, such as nuts, avocado, olive oil, and canola oil.  Drink water throughout the day. Avoid drinks that contain added sugar, such as soda or sweet tea.  Follow instructions from your health care provider about specific eating or drinking restrictions.  Control how much food you eat at a time (portion size). ? Check food labels to find out the serving sizes of foods. ? Use a kitchen scale to weigh amounts of foods.  Saute or steam food instead of frying it. Cook with water or broth instead of oils or butter.  Limit your intake of: ? Salt (sodium). Have no more than 1 tsp (2,400 mg) of sodium a day. If you have heart disease or high blood pressure, have less than ? tsp (1,500 mg) of sodium a day. ? Saturated fat. This is fat that is solid at room temperature, such as butter or fat on meat. What lifestyle changes can be made? Activity   Do moderate-intensity physical activity for at least 30 minutes on at least 5 days of the week, or as much as told by your health care provider.  Ask your health care provider what activities are safe for you. A mix of physical activities may be best, such as walking, swimming, cycling, and strength training.  Try to add physical activity into your day. For example: ? Park in spots that  are farther away than usual, so that you walk more. For example, park in a far corner of the parking lot when you go to the office or the grocery store. ? Take a walk during your lunch break. ? Use stairs instead of elevators or escalators. Weight Loss  Lose weight as directed. Your health care provider can determine how much weight loss is best for you and can help you lose weight safely.  If you are overweight or obese, you may be instructed to lose at least 5?7 % of your body weight. Alcohol and Tobacco   Limit alcohol intake to no more than 1 drink a day for nonpregnant women and 2 drinks a day for men. One drink equals 12 oz of beer, 5 oz of wine, or 1 oz of hard liquor.  Do not use any tobacco products, such as cigarettes, chewing tobacco, and e-cigarettes. If you need help quitting, ask your health care provider. Work With [facility] Provider  Have your blood glucose tested regularly, as told by your health care provider.  Discuss your risk factors and how you can reduce your risk for diabetes.  Get screening tests as told by your health care provider. You may have screening tests regularly, especially if you have certain risk factors for type 2 diabetes.  Make an appointment with a diet and nutrition specialist (registered dietitian). A registered dietitian can help you make a healthy eating plan and can help you understand portion sizes and food labels. Why are these changes important?  It is possible  to prevent or delay type 2 diabetes and related health problems by making lifestyle and nutrition changes.  It can be difficult to recognize signs of type 2 diabetes. The best way to avoid possible damage to your body is to take actions to prevent the disease before you develop symptoms. What can happen if changes are not made?  Your blood glucose levels may keep increasing. Having high blood glucose for a long time is dangerous. Too much glucose in your blood can damage  your blood vessels, heart, kidneys, nerves, and eyes.  You may develop prediabetes or type 2 diabetes. Type 2 diabetes can lead to many chronic health problems and complications, such as: ? Heart disease. ? Stroke. ? Blindness. ? Kidney disease. ? Depression. ? Poor circulation in the feet and legs, which could lead to surgical removal (amputation) in severe cases. Where to find support  Ask your health care provider to recommend a registered dietitian, diabetes educator, or weight loss program.  Look for local or online weight loss groups.  Join a gym, fitness club, or outdoor activity group, such as a walking club. Where to find more information To learn more about diabetes and diabetes prevention, visit:  American Diabetes Association (ADA): www.diabetes.[employer] of Diabetes and Digestive and Kidney Diseases: FindSpin.nl To learn more about healthy eating, visit:  The U.S. Department of Agriculture Scientist, research (physical sciences)), Choose My Plate: http://wiley-williams.com/  Office of Disease Prevention and Health Promotion (ODPHP), Dietary Guidelines: SurferLive.at Summary  You can reduce your risk for type 2 diabetes by increasing your physical activity, eating healthy foods, and losing weight as directed.  Talk with your health care provider about your risk for type 2 diabetes. Ask about any blood tests or screening tests that you need to have. This information is not intended to replace advice given to you by your health care provider. Make sure you discuss any questions you have with your health care provider. Document Revised: 10/26/2018 Document Reviewed: 08/25/2015 Elsevier Patient Education  [place].   c.

## 2020-07-02 ENCOUNTER — Telehealth: Payer: Self-pay | Admitting: Physician Assistant

## 2020-07-02 ENCOUNTER — Ambulatory Visit: Payer: Self-pay | Admitting: *Deleted

## 2020-07-02 DIAGNOSIS — R7303 Prediabetes: Secondary | ICD-10-CM | POA: Insufficient documentation

## 2020-07-02 DIAGNOSIS — R3589 Other polyuria: Secondary | ICD-10-CM | POA: Insufficient documentation

## 2020-07-02 DIAGNOSIS — I10 Essential (primary) hypertension: Secondary | ICD-10-CM | POA: Insufficient documentation

## 2020-07-02 NOTE — Telephone Encounter (Signed)
Answered question for agent did not speak to pt.

## 2020-07-02 NOTE — Telephone Encounter (Signed)
Pt does not have bp machine and he went [facility] yesterday concerning his bp check etc and would like new rx for bp machine to be sent to [facility]. Pt has medicaid and also has an appt with angela on 07/08/2020

## 2020-07-03 LAB — URINE CYTOLOGY ANCILLARY ONLY
Bacterial Vaginitis-Urine: NEGATIVE
Candida Urine: NEGATIVE
Chlamydia: NEGATIVE
Comment: NEGATIVE
Comment: NEGATIVE
Comment: NORMAL
Neisseria Gonorrhea: NEGATIVE
Trichomonas: NEGATIVE

## 2020-07-03 LAB — URINE CULTURE

## 2020-07-06 ENCOUNTER — Telehealth: Payer: Self-pay | Admitting: Orthopedic Surgery

## 2020-07-06 NOTE — Telephone Encounter (Signed)
Patient called asked if the order for (PT) was sent to [facility] on [address]. The number to contact patient is [phone]

## 2020-07-07 ENCOUNTER — Telehealth: Payer: Self-pay | Admitting: Orthopedic Surgery

## 2020-07-07 NOTE — Progress Notes (Signed)
Patient ID: Joshua [name], male   DOB: Jul 09, 1961, 59 y.o.   MRN: [id number]   Virtual Visit via Telephone Note  I connected with Joshua [name] on 07/08/20 at  9:50 AM EST by telephone and verified that I am speaking with the correct person using two identifiers.  Location: Patient: Joshua [name] Provider: Freeman Caldron, PA-C   I discussed the limitations, risks, security and privacy concerns of performing an evaluation and management service by telephone and the availability of in person appointments. I also discussed with the patient that there may be a patient responsible charge related to this service. The patient expressed understanding and agreed to proceed.  PATIENT visit by telephone virtually in the context of Covid-19 pandemic. Patient location:  home My Location:  [place] office Persons on the call: me and the patient;  Screened by Boykin Reaper   History of Present Illness: Seen by Victoriano Lain 07/01/2020.  A1C=6.2.  He is continuing to have some post void dribbling and an odd sensation at the end of urination.  Urine culture and cytology were negative.  No abdominal pain or fever.  He has not been checking his sugar bc he did not understand that Cari sent one to the pharmacy for him.  He is taking the metformin and has cut back on sugar.    From A/P 12/15: 1. Essential hypertension On review of medical record, patient was encouraged to add losartan to her regimen at last office visit with primary care provider on June 01, 2020.  Patient wants to obtain blood pressure cuff to use at home, encouraged patient to check blood pressures on a daily basis, keep a written log to next office visit, patient has appointment with Freeman Caldron, PA on December 22  2. Polyuria UA was negative for glucose, UTI - POCT URINALYSIS DIP (CLINITEK) - Urine Culture  3. Dysuria   4. Penile discharge, without blood - Urine cytology ancillary only  5. Prediabetes Patient's A1c  is 6.2 today, patient agreeable to trial Metformin 500 mg once daily, patient education given on low sugar diabetic diet, encourage patient to check blood glucose levels on a daily basis, keep a written log and bring to next office visit. - HgB A1c - metFORMIN (GLUCOPHAGE) 500 MG tablet; Take 1 tablet (500 mg total) by mouth 2 (two) times daily with a meal.  Dispense: 180 tablet; Refill: 3 - Blood Glucose Monitoring Suppl (TRUE METRIX METER) w/Device KIT; 1 Units by Does not apply route daily.  Dispense: 1 kit; Refill: 0 - glucose blood (TRUE METRIX BLOOD GLUCOSE TEST) test strip; Use as instructed  Dispense: 100 each; Refill: 12 - Lancets (BD LANCET ULTRAFINE 30G) MISC; 1 each by Does not apply route in the morning and at bedtime.  Dispense: 100 each; Refill: 11   Observations/Objective:  NAD.  A&Ox3   Assessment and Plan: 1. Post-void dribbling No infection  - Ambulatory referral to Urology  2. Prediabetes Continue metformin and instructed to p/up glucometer at [facility].     Follow Up Instructions: Assign PCP in 2 months   I discussed the assessment and treatment plan with the patient. The patient was provided an opportunity to ask questions and all were answered. The patient agreed with the plan and demonstrated an understanding of the instructions.   The patient was advised to call back or seek an in-person evaluation if the symptoms worsen or if the condition fails to improve as anticipated.  I provided 17 minutes of non-face-to-face time  during this encounter.   Freeman Caldron, PA-C

## 2020-07-07 NOTE — Telephone Encounter (Signed)
I called and lm on vm to advise pt that the physical therapy order was sent to [facility] and that a note in the chart states that they have tried to reach the pt and have been unsuccessful with the number in chart. Left number for therapy office [phone] to call with any other questions.

## 2020-07-07 NOTE — Telephone Encounter (Signed)
I called and gave pt information about the referral that I left on vm. Referral made 06/23/20, transferred to neuro rehab 12/9 they tried to call and make appt phone number was not working.  Gave pt number to contact and make appt. To call with any questions.

## 2020-07-07 NOTE — Telephone Encounter (Signed)
Patient called. Would like to speak with Joshua [name]. Says he missed a phone call from her. His call back number is [phone]

## 2020-07-08 ENCOUNTER — Other Ambulatory Visit: Payer: Self-pay

## 2020-07-08 ENCOUNTER — Other Ambulatory Visit: Payer: Self-pay | Admitting: Physician Assistant

## 2020-07-08 ENCOUNTER — Ambulatory Visit: Payer: Medicaid Other | Admitting: Physical Therapy

## 2020-07-08 ENCOUNTER — Ambulatory Visit: Payer: Medicaid Other | Attending: Family Medicine | Admitting: Physician Assistant

## 2020-07-08 DIAGNOSIS — R7303 Prediabetes: Secondary | ICD-10-CM | POA: Diagnosis not present

## 2020-07-08 DIAGNOSIS — N3943 Post-void dribbling: Secondary | ICD-10-CM | POA: Diagnosis not present

## 2020-07-08 DIAGNOSIS — I10 Essential (primary) hypertension: Secondary | ICD-10-CM

## 2020-07-08 MED ORDER — ACCU-CHEK AVIVA PLUS W/DEVICE KIT: 1.0000 [IU] | PACK | Freq: Once | 0 refills | Status: AC

## 2020-07-08 MED ORDER — ACCU-CHEK SOFTCLIX LANCETS MISC: 12 refills | Status: DC

## 2020-07-08 MED ORDER — MISC. DEVICES MISC: 0 refills | Status: DC

## 2020-07-08 MED ORDER — ACCU-CHEK AVIVA PLUS VI STRP: ORAL_STRIP | 12 refills | Status: DC

## 2020-07-09 ENCOUNTER — Telehealth: Payer: Self-pay | Admitting: *Deleted

## 2020-07-09 ENCOUNTER — Encounter: Payer: Self-pay | Admitting: Physical Therapy

## 2020-07-09 ENCOUNTER — Ambulatory Visit: Payer: Medicaid Other | Attending: Family | Admitting: Physical Therapy

## 2020-07-09 ENCOUNTER — Other Ambulatory Visit: Payer: Self-pay

## 2020-07-09 DIAGNOSIS — R600 Localized edema: Secondary | ICD-10-CM | POA: Diagnosis not present

## 2020-07-09 DIAGNOSIS — G8929 Other chronic pain: Secondary | ICD-10-CM | POA: Insufficient documentation

## 2020-07-09 DIAGNOSIS — M25511 Pain in right shoulder: Secondary | ICD-10-CM | POA: Diagnosis not present

## 2020-07-09 DIAGNOSIS — M6281 Muscle weakness (generalized): Secondary | ICD-10-CM | POA: Diagnosis not present

## 2020-07-09 NOTE — Telephone Encounter (Signed)
Patient called left vm reporting trembling, shaking, barely walking.  Had to get someone to help him get in the car.  He states that the Clonazepam not helping 'Feels like drinking".  Attempted to call patient back, left message pending call back to discuss concerns.

## 2020-07-09 NOTE — Therapy (Signed)
[place], [place], [address] Phone: [phone]   Fax:  [phone]  Physical Therapy Evaluation  Patient Details  Name: Joshua [name] MRN: [id number] Date of Birth: [birth year] Referring Provider (PT): Suzan Slick, NP   Encounter Date: 07/09/2020   PT End of Session - 07/09/20 1042    Visit Number 1    Number of Visits 13    Date for PT Re-Evaluation 09/03/20    Authorization Type [place] MCD - submitted on 07/09/2020    PT Start Time 1015    PT Stop Time 1056    PT Time Calculation (min) 41 min    Activity Tolerance Patient tolerated treatment well    Behavior During Therapy [facility] for tasks assessed/performed           Past Medical History:  Diagnosis Date  . Anxiety   . Brain tumor ([place]) 02/20/2013   brain tumor removed in March 2014, Dr Donald Pore  . Diffuse idiopathic skeletal hyperostosis 06/05/2013  . Dizziness   . Enlarged prostate   . GERD (gastroesophageal reflux disease)   . Headache(784.0)    scattered  . Hypertension   . 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[place]) 03/07/2019  . Meningioma ([place])   . Meningioma, recurrent of brain ([place]) 01/31/2019  . Neuropathy 12/05/2019  . Rotator cuff tear    right  . Seizures ([place])    11/27/19 last sz 1 wk ago    Past Surgical History:  Procedure Laterality Date  . APPLICATION OF CRANIAL NAVIGATION N/A 01/10/2019   Procedure: APPLICATION OF CRANIAL NAVIGATION;  Surgeon: Erline Levine, MD;  Location: [facility];  Service: Neurosurgery;  Laterality: N/A;  . CATARACT EXTRACTION Right   . CRANIOTOMY Right 11/06/2012   Procedure: CRANIOTOMY TUMOR EXCISION;  Surgeon: Erline Levine, MD;  Location: [facility];  Service: Neurosurgery;  Laterality: Right;  Right Parasagittal craniotomy for meningioma with Stealth  . CRANIOTOMY Right 01/10/2019   Procedure: Right Parasagittal Craniotomy for Tumor;  Surgeon: Erline Levine, MD;  Location: [facility];  Service:  Neurosurgery;  Laterality: Right;  Right parasagittal craniotomy for tumor  . ESOPHAGOGASTRODUODENOSCOPY    . right knee arthroscopy    . SHOULDER ARTHROSCOPY Right 06/09/2020   Procedure: RIGHT SHOULDER ARTHROSCOPY AND DEBRIDEMENT;  Surgeon: Newt Minion, MD;  Location: [facility];  Service: Orthopedics;  Laterality: Right;    There were no vitals filed for this visit.    Subjective Assessment - 07/09/20 1017    Subjective pt is a 59 y.o M s/p R shoulder athroscopy/ debridementon 06/09/2020. Since surgery he reports he is still having issues with raising the arm noting pain and popping. He reports pain stays mostly in the shoulder and limits his sleeping and is unable to sleeping. Since the surgery he reports the pain seems to be staying the same.    Limitations Lifting   pushing, pulling, carrying   How long can you sit comfortably? unlimited    How long can you stand comfortably? limited due to use of SPC in RUE 20 min    How long can you walk comfortably? limited due to use of SPC in RUE - 20 min    Diagnostic tests CT 10/15   IMPRESSION:  Complete supraspinatus and infraspinatus tendon tears with  retraction to the glenohumeral joint. Mild to moderate atrophy is  worse in the infraspinatus. Edema in the muscle bellies secondary to  atrophy is also worse in the  infraspinatus.     Severe long head of biceps tendinopathy. The intra-articular segment  is markedly attenuated consistent with partial tear.     Subscapularis tendinopathy without tear.     Mild acromioclavicular and glenohumeral osteoarthritis.     Subacromial/subdeltoid bursitis.     No acute bony abnormality.    Patient Stated Goals to decrease pain.    Currently in Pain? Yes    Pain Score 8     Pain Location Shoulder    Pain Orientation Right    Pain Descriptors / Indicators Discomfort    Pain Type Chronic pain;Surgical pain    Pain Onset More than a month ago    Pain Frequency Constant    Aggravating  Factors  any lifting, pushing, pulling or general activity, laying on the R shoulder    Pain Relieving Factors let it hang off the edge of the bed and move it back and forth.              [facility] PT Assessment - 07/09/20 0001      Assessment   Medical Diagnosis Impingement syndrome of right shoulder (M75.41)    Referring Provider (PT) Suzan Slick, NP    Onset Date/Surgical Date 06/09/20    Hand Dominance Right    Next MD Visit unsure    Prior Therapy yes   for LE     Precautions   Precautions None      Restrictions   Weight Bearing Restrictions No      Balance Screen   Has the patient fallen in the past 6 months No    Has the patient had a decrease in activity level because of a fear of falling?  No    Is the patient reluctant to leave their home because of a fear of falling?  No      Home Ecologist residence    Living Arrangements Spouse/significant other    Available Help at Discharge Family    Type of [place] to enter    Entrance Stairs-Number of Steps 12    [place] reach both    [place] One level    [place] - single point;[place] - 2 wheels      Prior Function   Level of Independence Independent with basic ADLs    Vocation On disability    Leisure fishing      Cognition   Overall Cognitive Status Within Functional Limits for tasks assessed      ROM / Strength   AROM / PROM / Strength Strength;AROM;PROM      AROM   AROM Assessment Site Shoulder    Right/Left Shoulder Right;Left    Right Shoulder Extension 55 Degrees    Right Shoulder Flexion 78 Degrees    Right Shoulder ABduction 68 Degrees    Right Shoulder Internal Rotation --   L1   Right Shoulder External Rotation --   occipital bone   Left Shoulder Extension 75 Degrees    Left Shoulder Flexion 140 Degrees    Left Shoulder ABduction 115 Degrees    Left Shoulder Internal Rotation --   t10   Left Shoulder  External Rotation --   C7     PROM   Overall PROM Comments guarding throughout PROM assessment requring frequent cues to relax    PROM Assessment Site Shoulder    Right/Left Shoulder Right    Right Shoulder Flexion 95  Degrees    Right Shoulder ABduction 100 Degrees      Strength   Overall Strength Comments didn't test theR due to irritabiltiy    Strength Assessment Site Shoulder;Hand    Right/Left Shoulder Right;Left    Left Shoulder Flexion 5/5    Left Shoulder Extension 5/5    Left Shoulder ABduction 4+/5    Left Shoulder Internal Rotation 4+/5    Left Shoulder External Rotation 4+/5    Right Hand Grip (lbs) 32   33,25,38   Left Hand Grip (lbs) 57   47,62,62     Palpation   Palpation comment TTP along the proximal long head bicep tendon, upper trap/ levator scapuale tension with trigger points, and soreness along supra/ infraspinatus.                      Objective measurements completed on examination: See above findings.               PT Education - 07/09/20 1055    Education Details evaluation findings, POC, goals, HEP with proper form/ rationale.    Person(s) Educated Patient    Methods Explanation;Verbal cues;Handout    Comprehension Verbalized understanding;Verbal cues required            PT Short Term Goals - 07/09/20 1113      PT SHORT TERM GOAL #1   Title pt to be IND with inital HEP    Baseline no previous HEP    Time 3    Period Weeks    Status New    Target Date 07/30/20      PT SHORT TERM GOAL #2   Title pt to verbalize techniques to reduce R shoulder pain via RICE and HEP    Baseline no previous knowledge of pain reduction outside of medication    Time 3    Period Weeks    Status New      PT SHORT TERM GOAL #3   Title pt to increaes R shoulder PROM/ AAROM flexion by >/= 15 degrees to promote functional mobility    Baseline PROM flexion 95 degrees, 100 degres abduction    Time 3    Period Weeks    Status New    Target  Date 07/30/20             PT Long Term Goals - 07/09/20 1115      PT LONG TERM GOAL #1   Title increase R shoulder AROM flexion/ abduction to >/= 120 degrees and IR/ER with functional limits compard bil with </= 2/10 max pain    Baseline flexion AROM 78, abduction 68 with 7/10 pain    Time 6    Period Weeks    Status New    Target Date 09/03/20      PT LONG TERM GOAL #2   Title pt to increase R shoulder strength to >/= 4/5 to promote stability and  functional lifting/ carrying activities    Baseline unable to test strength due to irritabiity at evaluation    Time 6    Period Weeks    Status New    Target Date 09/03/20      PT LONG TERM GOAL #3   Title pt to be able to lift/ lower >/= 8# to and from and overhead shelf carry >/= 10# in RUE with </= 2/10 max pain for functional strength    Time 6    Period Weeks    Status New  Target Date 09/03/20      PT LONG TERM GOAL #4   Title pt to be IND with all HEP given and is able to maintain and progress her current LOF IND.    Baseline no previous HEP    Time 6    Period Weeks    Status New    Target Date 09/03/20      PT LONG TERM GOAL #5   Title -    Baseline -                  Plan - 07/09/20 1056    Clinical Impression Statement pt is a pleasant 59 y.o M presenting to oPPT s/p R shoulder arthroscopy / debridement on 06/09/2020. he demonstrates limited AROM/ PROM with notable guarding throughout assessment. limited strength assessment due to increased irritability for the R shoudler. TTP noted along proximal long head bicep, supra/infraspinatus and multiple trigger points in the R upper trap/ levator scapulae. He would benefit from physical therapy to decrease R shoulder pain, improve ROM/ Strength, and maximize his function by addressing the deficits listed.    Personal Factors and Comorbidities Comorbidity 3+    Comorbidities hx of CX, anxiety, seizures    Examination-Activity Limitations Stand;Lift;Locomotion  Level;Carry;Dressing;Hygiene/Grooming    Stability/Clinical Decision Making Evolving/Moderate complexity    Clinical Decision Making Moderate    Rehab Potential Good    PT Frequency 2x / week    PT Duration 6 weeks   initial MCD auth 1 x aweek for 3 weeks.   PT Treatment/Interventions ADLs/Self Care Home Management;Cryotherapy;Gait training;Stair training;Therapeutic activities;Therapeutic exercise;Neuromuscular re-education;Patient/family education;Manual techniques;Dry needling;Taping;Vasopneumatic Device    PT Next Visit Plan review/ update HEP PRn, Shoulde rmobs/ PROM, continued working on [employer], scapular setting, modalities for pain (no heat/ E-stim hx of    PT Home Exercise Plan [id number] - pendulums, Shoulder wand flexion/ abduction, scapular retraction, upper trap stretch,    Consulted and Agree with Plan of Care Patient           Patient will benefit from skilled therapeutic intervention in order to improve the following deficits and impairments:  Improper body mechanics,Increased muscle spasms,Decreased strength,Postural dysfunction,Pain,Decreased activity tolerance,Decreased endurance,Increased edema  Visit Diagnosis: Chronic right shoulder pain  Muscle weakness (generalized)  Localized edema     Problem List Patient Active Problem List   Diagnosis Date Noted  . Essential hypertension 07/02/2020  . Prediabetes 07/02/2020  . Polyuria 07/02/2020  . Nontraumatic complete tear of right rotator cuff   . Impingement syndrome of right shoulder   . Neuropathy 12/05/2019  . 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[place]) 03/07/2019  . Seizures ([place]) 01/31/2019  . Meningioma, recurrent of brain ([place]) 01/31/2019  . Meningioma ([place]) 01/03/2019  . Brain tumor ([place]) 12/27/2018  . Diffuse idiopathic skeletal hyperostosis 06/05/2013   Starr Lake PT, DPT, LAT, ATC  07/09/20  11:23 AM      [facility] [address] [place], [place], [address] Phone: [phone]   Fax:  [phone]  Name: Joshua [name] MRN: [id number] Date of Birth: 02-Dec-1960

## 2020-07-09 NOTE — Telephone Encounter (Signed)
-----   Message from Kennieth Rad, [place] sent at 07/06/2020  9:53 AM EST ----- Please call patient and let him know that his urine culture was negative for any bacteria or any type of STI.

## 2020-07-09 NOTE — Telephone Encounter (Signed)
Patient verified DOB Patient is aware of urine being negative for STI and bacteria. Patient is concern about his Prostate and wants testing for concerns for slow streaming. Patient was advised to reach back out to urology for appointment.

## 2020-07-09 NOTE — Telephone Encounter (Signed)
Referral was place by Freeman Caldron, PA 07/08/2020

## 2020-07-10 DIAGNOSIS — I10 Essential (primary) hypertension: Secondary | ICD-10-CM | POA: Diagnosis not present

## 2020-07-13 ENCOUNTER — Telehealth: Payer: Self-pay

## 2020-07-13 ENCOUNTER — Other Ambulatory Visit: Payer: Self-pay | Admitting: Physician Assistant

## 2020-07-13 ENCOUNTER — Telehealth: Payer: Self-pay | Admitting: Orthopedic Surgery

## 2020-07-13 NOTE — Telephone Encounter (Signed)
We need to see him for an appointment if he is hurting

## 2020-07-13 NOTE — Telephone Encounter (Signed)
Patient called. Would like something other than percocet called in for pain. His call back number is [phone]

## 2020-07-13 NOTE — Telephone Encounter (Signed)
I left voicemail for patient advising we can work him in for an appointment with Bevely Palmer tomorrow so that he does not have to wait for an appointment. He is post op and if he is having this much pain, he will need to be evaluated in the office.

## 2020-07-13 NOTE — Telephone Encounter (Signed)
I left voicemail for patient advising. 

## 2020-07-13 NOTE — Telephone Encounter (Signed)
Please advise 

## 2020-07-13 NOTE — Telephone Encounter (Signed)
Patient called regarding previous messages. He stated he would like to know what could he do in the meantime for pain, I did let the patient know he needs to schedule a appointment if he is in pain PER mary anne patient refuses stating "it'll be too far out" because he is in pt. CB:[phone]

## 2020-07-13 NOTE — Telephone Encounter (Signed)
Too far out from surgery

## 2020-07-13 NOTE — Telephone Encounter (Signed)
Is there something else that you recommend for patient?

## 2020-07-13 NOTE — Telephone Encounter (Signed)
Pt called and would like a phone call if you change his prescription. He cant take 800 mg tylenol or ibuprofen because of his condition.

## 2020-07-15 ENCOUNTER — Other Ambulatory Visit: Payer: Self-pay

## 2020-07-15 ENCOUNTER — Encounter: Payer: Self-pay | Admitting: Emergency Medicine

## 2020-07-15 ENCOUNTER — Emergency Department: Payer: Medicaid Other

## 2020-07-15 ENCOUNTER — Emergency Department
Admission: EM | Admit: 2020-07-15 | Discharge: 2020-07-15 | Disposition: A | Discharge status: Home / Self Care | Payer: Medicaid Other | Attending: Emergency Medicine | Admitting: Emergency Medicine

## 2020-07-15 ENCOUNTER — Ambulatory Visit (INDEPENDENT_AMBULATORY_CARE_PROVIDER_SITE_OTHER): Payer: Medicaid Other | Admitting: Physician Assistant

## 2020-07-15 ENCOUNTER — Encounter: Payer: Self-pay | Admitting: Physician Assistant

## 2020-07-15 VITALS — Ht 68.0 in | Wt 196.0 lb

## 2020-07-15 DIAGNOSIS — F17210 Nicotine dependence, cigarettes, uncomplicated: Secondary | ICD-10-CM | POA: Insufficient documentation

## 2020-07-15 DIAGNOSIS — R059 Cough, unspecified: Secondary | ICD-10-CM | POA: Diagnosis not present

## 2020-07-15 DIAGNOSIS — Z79899 Other long term (current) drug therapy: Secondary | ICD-10-CM | POA: Insufficient documentation

## 2020-07-15 DIAGNOSIS — Z20822 Contact with and (suspected) exposure to covid-19: Secondary | ICD-10-CM | POA: Diagnosis not present

## 2020-07-15 DIAGNOSIS — I10 Essential (primary) hypertension: Secondary | ICD-10-CM | POA: Insufficient documentation

## 2020-07-15 DIAGNOSIS — J069 Acute upper respiratory infection, unspecified: Secondary | ICD-10-CM | POA: Diagnosis present

## 2020-07-15 DIAGNOSIS — Z85841 Personal history of malignant neoplasm of brain: Secondary | ICD-10-CM | POA: Insufficient documentation

## 2020-07-15 DIAGNOSIS — M7541 Impingement syndrome of right shoulder: Secondary | ICD-10-CM

## 2020-07-15 DIAGNOSIS — Z7982 Long term (current) use of aspirin: Secondary | ICD-10-CM | POA: Insufficient documentation

## 2020-07-15 LAB — SARS CORONAVIRUS 2 (TAT 6-24 HRS): SARS Coronavirus 2: NEGATIVE

## 2020-07-15 MED ORDER — HYDROCODONE-ACETAMINOPHEN 5-325 MG PO TABS: 1.0000 | ORAL_TABLET | ORAL | 0 refills | Status: DC | PRN

## 2020-07-15 NOTE — ED Notes (Signed)
AAOx3.  Skin warm and dry.  NAD 

## 2020-07-15 NOTE — Discharge Instructions (Addendum)
Quarantine for 10 days. Take over-the-counter vitamin C, vitamin D, and zinc.  Take over-the-counter Mucinex for cough or congestion. Return emergency department if worse

## 2020-07-15 NOTE — ED Provider Notes (Signed)
[facility] Emergency Department Provider Note  ____________________________________________   Event Date/Time   First MD Initiated Contact with Patient 07/15/20 1200     (approximate)  I have reviewed the triage vital signs and the nursing notes.   HISTORY  Chief Complaint URI    HPI Joshua [name] is a 59 y.o. male presents emergency department with Covid-like symptoms since Monday.  Patient states that his roommate was exposed to a new Covid.  While at the [employer] [place].  States family stayed with his all positive for Covid at this time.  States he has been around him and now he has started having cold symptoms.  Patient is vaccinated for Covid with a booster also.    Past Medical History:  Diagnosis Date  . Anxiety   . Brain tumor ([place]) 02/20/2013   brain tumor removed in March 2014, Dr Donald Pore  . Diffuse idiopathic skeletal hyperostosis 06/05/2013  . Dizziness   . Enlarged prostate   . GERD (gastroesophageal reflux disease)   . Headache(784.0)    scattered  . Hypertension   . 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 Meningioma ([place])   . Meningioma, recurrent of brain ([place]) 01/31/2019  . Neuropathy 12/05/2019  . Rotator cuff tear    right  . Seizures ([place])    11/27/19 last sz 1 wk ago    Patient Active Problem List   Diagnosis Date Noted  . Essential hypertension 07/02/2020  . Prediabetes 07/02/2020  . Polyuria 07/02/2020  . Nontraumatic complete tear of right rotator cuff   . Impingement syndrome of right shoulder   . Neuropathy 12/05/2019  . 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 Seizures ([place]) 01/31/2019  . Meningioma, recurrent of brain ([place]) 01/31/2019  . Meningioma ([place]) 01/03/2019  . Brain tumor ([place]) 12/27/2018  . Diffuse idiopathic skeletal hyperostosis 06/05/2013    Past Surgical History:  Procedure Laterality Date  . APPLICATION OF CRANIAL NAVIGATION  N/A 01/10/2019   Procedure: APPLICATION OF CRANIAL NAVIGATION;  Surgeon: Erline Levine, MD;  Location: [place];  Service: Neurosurgery;  Laterality: N/A;  . CATARACT EXTRACTION Right   . CRANIOTOMY Right 11/06/2012   Procedure: CRANIOTOMY TUMOR EXCISION;  Surgeon: Erline Levine, MD;  Location: [place] [facility];  Service: Neurosurgery;  Laterality: Right;  Right Parasagittal craniotomy for meningioma with Stealth  . CRANIOTOMY Right 01/10/2019   Procedure: Right Parasagittal Craniotomy for Tumor;  Surgeon: Erline Levine, MD;  Location: [place];  Service: Neurosurgery;  Laterality: Right;  Right parasagittal craniotomy for tumor  . ESOPHAGOGASTRODUODENOSCOPY    . right knee arthroscopy    . SHOULDER ARTHROSCOPY Right 06/09/2020   Procedure: RIGHT SHOULDER ARTHROSCOPY AND DEBRIDEMENT;  Surgeon: Newt Minion, MD;  Location: [place];  Service: Orthopedics;  Laterality: Right;    Prior to Admission medications   Medication Sig Start Date End Date Taking? Authorizing Provider  Accu-Chek Softclix Lancets lancets Use as instructed 07/08/20   Mayers, Cari S, PA-C  amLODipine (NORVASC) 5 MG tablet Take 1 tablet (5 mg total) by mouth daily. To lower blood pressure 05/06/20   Fulp, Cammie, MD  aspirin 81 MG chewable tablet     [provider]  atorvastatin (LIPITOR) 20 MG tablet TAKE 1 TABLET BY MOUTH EVERY DAY 06/29/20   Charlott Rakes, MD  Blood Glucose Monitoring Suppl (TRUE METRIX METER) w/Device KIT 1 Units by Does not apply route daily. 07/01/20 07/31/20  Mayers, Cari S, PA-C  clonazePAM (KLONOPIN) 0.5 MG tablet TAKE 1 TABLET(0.5 MG) BY MOUTH TWICE DAILY 05/05/20   Vaslow, Acey Lav, MD  glucose blood (ACCU-CHEK AVIVA PLUS) test strip Use as instructed 07/08/20   Mayers, Cari S, PA-C  HYDROcodone-acetaminophen (NORCO/VICODIN) 5-325 MG tablet Take 1 tablet by mouth every 4 (four) hours as needed for moderate pain. 07/15/20   Persons, Bevely Palmer, PA  Lacosamide 100 MG TABS Take 2  tablets (200 mg total) by mouth in the morning and at bedtime. 06/15/20   [name], MD  lamoTRIgine (LAMICTAL) 100 MG tablet TAKE 2 TABLETS (200 MG TOTAL) BY MOUTH 2 (TWO) TIMES DAILY. 06/29/20   Vaslow, Acey Lav, MD  LORazepam (ATIVAN) 1 MG tablet Take 1 tablet (1 mg total) by mouth every 8 (eight) hours as needed for seizure (for breakthrough seizures). 12/26/19   Vaslow, Acey Lav, MD  metFORMIN (GLUCOPHAGE) 500 MG tablet Take 1 tablet (500 mg total) by mouth 2 (two) times daily with a meal. 07/01/20   Mayers, Cari S, PA-C  methocarbamol (ROBAXIN) 500 MG tablet Take 1 tablet (500 mg total) by mouth every 8 (eight) hours as needed for muscle spasms. 04/22/20   Davonna Belling, MD  mirtazapine (REMERON) 15 MG tablet Take 15 mg by mouth at bedtime as needed (Sleep).     [provider]  Misc. Devices MISC Please provide BP cuff for patient 07/08/20   Mathis Dad  omeprazole (PRILOSEC) 20 MG capsule One pill twice daily- morning and before bedtime to reduce stomach acid 04/15/20   Fulp, Cammie, MD  ondansetron (ZOFRAN) 4 MG tablet Take 1 tablet (4 mg total) by mouth every 8 (eight) hours as needed for nausea or vomiting. 06/01/20   Fulp, Cammie, MD  ondansetron (ZOFRAN-ODT) 4 MG disintegrating tablet Take 1 tablet (4 mg total) by mouth every 8 (eight) hours as needed for nausea or vomiting. 02/04/20   Vaslow, Acey Lav, MD  sucralfate (CARAFATE) 1 g tablet Take 1 tablet (1 g total) by mouth 4 (four) times daily -  with meals and at bedtime. 05/06/20   Fulp, Cammie, MD  VIMPAT 100 MG TABS TAKE 2 TABLETS IN MORNING AND 2 AT BEDTIME 05/11/20   Vaslow, Acey Lav, MD    Allergies Patient has no known allergies.  Family History  Problem Relation Age of Onset  . Hypertension Mother   . Dementia Mother   . Diabetes Mother   . Hypertension Father   . CAD Father   . Diabetes Father   . CAD Brother     Social History Social History   Tobacco Use  . Smoking status:  Current Some Day Smoker    Types: Cigarettes    Last attempt to quit: 01/29/2019    Years since quitting: 1.4  . Smokeless tobacco: Never Used  . Tobacco comment: weekend smoker  Vaping Use  . Vaping Use: Never used  Substance Use Topics  . Alcohol use: Yes    Comment: social  . Drug use: No    Review of Systems  Constitutional: No fever/chills Eyes: No visual changes. ENT: No sore throat. Respiratory: Positive cough Cardiovascular: Denies chest pain Gastrointestinal: Denies abdominal pain Genitourinary: Negative for dysuria. Musculoskeletal: Negative for back pain. Skin: Negative for rash. Psychiatric: no mood changes,     ____________________________________________   PHYSICAL EXAM:  VITAL SIGNS: ED Triage Vitals  Enc Vitals Group     BP 07/15/20 1121 126/81     Pulse  Rate 07/15/20 1121 73     Resp 07/15/20 1121 20     Temp 07/15/20 1121 98.4 F (36.9 C)     Temp Source 07/15/20 1121 Oral     SpO2 07/15/20 1121 97 %     Weight 07/15/20 1119 180 lb (81.6 kg)     Height 07/15/20 1119 5' 8" (1.727 m)     Head Circumference --      Peak Flow --      Pain Score 07/15/20 1119 0     Pain Loc --      Pain Edu? --      Excl. in [place]? --     Constitutional: Alert and oriented. Well appearing and in no acute distress. Eyes: Conjunctivae are normal.  Head: Atraumatic. Nose: No congestion/rhinnorhea. Mouth/Throat: Mucous membranes are moist.  Neck:  supple no lymphadenopathy noted Cardiovascular: Normal rate, regular rhythm. Heart sounds are normal Respiratory: Normal respiratory effort.  No retractions, lungs c t a  GU: deferred Musculoskeletal: FROM all extremities, warm and well perfused Neurologic:  Normal speech and language.  Skin:  Skin is warm, dry and intact. No rash noted. Psychiatric: Mood and affect are normal. Speech and behavior are normal.  ____________________________________________   LABS (all labs ordered are listed, but only abnormal  results are displayed)  Labs Reviewed  SARS CORONAVIRUS 2 (TAT 6-24 HRS)   ____________________________________________   ____________________________________________  RADIOLOGY  Chest x-ray  ____________________________________________   PROCEDURES  Procedure(s) performed: No  Procedures    ____________________________________________   INITIAL IMPRESSION / ASSESSMENT AND PLAN / ED COURSE  Pertinent labs & imaging results that were available during my care of the patient were reviewed by me and considered in my medical decision making (see chart for details).   Patient is a 59 year old male presents emergency department with Covid-like symptoms with positive exposure.  See HPI.  Physical exam shows patient appears stable and well.  Chest x-ray and send out Covid test ordered   Chest x-ray was reviewed by me and confirmed by radiology to be normal.  Covid test is pending  I did discuss findings with patient.  He is to follow-up with regular doctor as needed.  Return emergency department worsening.  Over-the-counter measures were discussed.  Is discharged stable condition.  MICHAIAH HOLSOPPLE was evaluated in Emergency Department on 07/15/2020 for the symptoms described in the history of present illness. He was evaluated in the context of the global COVID-19 pandemic, which necessitated consideration that the patient might be at risk for infection with the SARS-CoV-2 virus that causes COVID-19. Institutional protocols and algorithms that pertain to the evaluation of patients at risk for COVID-19 are in a state of rapid change based on information released by regulatory bodies including the CDC and federal and state organizations. These policies and algorithms were followed during the patient's care in the ED.    As part of my medical decision making, I reviewed the following data within the [place] notes reviewed and incorporated, Old chart  reviewed, Radiograph reviewed , Notes from prior ED visits and [place] Controlled Substance Database  ____________________________________________   FINAL CLINICAL IMPRESSION(S) / ED DIAGNOSES  Final diagnoses:  Suspected COVID-19 virus infection      NEW MEDICATIONS STARTED DURING THIS VISIT:  New Prescriptions   No medications on file     Note:  This document was prepared using Dragon voice recognition software and may include unintentional dictation errors.    Ashok Cordia  W, PA-C 07/15/20 1319    Harvest Dark, MD 07/15/20 [phone]

## 2020-07-15 NOTE — Progress Notes (Signed)
Office Visit Note   Patient: Joshua [name]           Date of Birth: Jan 28, 1961           MRN: [id number] Visit Date: 07/15/2020              Requested by: No referring provider defined for this encounter. PCP: Patient, No Pcp Per  Chief Complaint  Patient presents with  . Right Shoulder - Routine Post Op    06/09/20 right shoulder scope and debridement       HPI: This is a pleasant gentleman who is 6 weeks status post right shoulder arthroscopy and debridement.  He overall is doing okay.  He is due to start physical therapy on January 5.  In the meantime he has been doing some home exercises.  He still is having difficulty sleeping at night because of the pain.  He could not tolerate the Percocet but as tolerated hydrocodone and is wondering if he could have a prescription for this.  He cannot take anti-inflammatories because of some inflammatory bowel issues  Assessment & Plan: Visit Diagnoses: No diagnosis found.  Plan: I told the patient I can give him 1 prescription of hydrocodone to take sparingly he will not get a refill of this.  We hopefully will improve once he starts physical therapy  Follow-Up Instructions: No follow-ups on file.   Ortho Exam  Patient is alert, oriented, no adenopathy, well-dressed, normal affect, normal respiratory effort. Well-healed surgical portals.  He has forward extension to about 90 degrees he has internal rotation behind his back to just behind his back he has some good strength with resisted abduction well-healed surgical portals without any evidence of infection  Imaging: No results found. No images are attached to the encounter.  Labs: Lab Results  Component Value Date   HGBA1C 6.2 (A) 07/01/2020   HGBA1C 6.3 (H) 04/15/2020   HGBA1C 6.2 (H) 12/03/2019   REPTSTATUS 02/28/2020 FINAL 02/27/2020   CULT MULTIPLE SPECIES PRESENT, SUGGEST RECOLLECTION (A) 02/27/2020     Lab Results  Component Value Date   ALBUMIN 4.2 04/25/2020    ALBUMIN 4.7 04/15/2020   ALBUMIN 4.3 03/10/2020    Lab Results  Component Value Date   MG 1.8 07/09/2019   No results found for: VD25OH  No results found for: PREALBUMIN CBC EXTENDED Latest Ref Rng & Units 06/21/2020 06/11/2020 05/12/2020  WBC 4.0 - 10.5 K/uL 8.7 8.8 7.0  RBC 4.22 - 5.81 MIL/uL 4.97 4.71 5.29  HGB 13.0 - 17.0 g/dL 93.8 10.1 75.1  HCT 02.5 - 52.0 % 45.8 43.1 48.9  PLT 150 - 400 K/uL 295 268 278  NEUTROABS 1.7 - 7.7 K/uL - - -  LYMPHSABS 0.7 - 4.0 K/uL - - -     Body mass index is 29.8 kg/m.  Orders:  No orders of the defined types were placed in this encounter.  Meds ordered this encounter  Medications  . HYDROcodone-acetaminophen (NORCO/VICODIN) 5-325 MG tablet    Sig: Take 1 tablet by mouth every 4 (four) hours as needed for moderate pain.    Dispense:  30 tablet    Refill:  0     Procedures: No procedures performed  Clinical Data: No additional findings.  ROS:  All other systems negative, except as noted in the HPI. Review of Systems  Objective: Vital Signs: Ht 5\' 8"  (1.727 m)   Wt 196 lb (88.9 kg)   BMI 29.80 kg/m   Specialty Comments:  No specialty comments available.  PMFS History: Patient Active Problem List   Diagnosis Date Noted  . Essential hypertension 07/02/2020  . Prediabetes 07/02/2020  . Polyuria 07/02/2020  . Nontraumatic complete tear of right rotator cuff   . Impingement syndrome of right shoulder   . Neuropathy 12/05/2019  . 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 Seizures ([place]) 01/31/2019  . Meningioma, recurrent of brain ([place]) 01/31/2019  . Meningioma ([place]) 01/03/2019  . Brain tumor ([place]) 12/27/2018  . Diffuse idiopathic skeletal hyperostosis 06/05/2013   Past Medical History:  Diagnosis Date  . Anxiety   . Brain tumor ([place]) 02/20/2013   brain tumor removed in March 2014, Dr Donald Pore  . Diffuse idiopathic skeletal hyperostosis 06/05/2013  . Dizziness   . Enlarged prostate   .  GERD (gastroesophageal reflux disease)   . Headache(784.0)    scattered  . Hypertension   . 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 Meningioma ([place])   . Meningioma, recurrent of brain ([place]) 01/31/2019  . Neuropathy 12/05/2019  . Rotator cuff tear    right  . Seizures ([place])    11/27/19 last sz 1 wk ago    Family History  Problem Relation Age of Onset  . Hypertension Mother   . Dementia Mother   . Diabetes Mother   . Hypertension Father   . CAD Father   . Diabetes Father   . CAD Brother     Past Surgical History:  Procedure Laterality Date  . APPLICATION OF CRANIAL NAVIGATION N/A 01/10/2019   Procedure: APPLICATION OF CRANIAL NAVIGATION;  Surgeon: Erline Levine, MD;  Location: [place];  Service: Neurosurgery;  Laterality: N/A;  . CATARACT EXTRACTION Right   . CRANIOTOMY Right 11/06/2012   Procedure: CRANIOTOMY TUMOR EXCISION;  Surgeon: Erline Levine, MD;  Location: [place] [facility];  Service: Neurosurgery;  Laterality: Right;  Right Parasagittal craniotomy for meningioma with Stealth  . CRANIOTOMY Right 01/10/2019   Procedure: Right Parasagittal Craniotomy for Tumor;  Surgeon: Erline Levine, MD;  Location: [place];  Service: Neurosurgery;  Laterality: Right;  Right parasagittal craniotomy for tumor  . ESOPHAGOGASTRODUODENOSCOPY    . right knee arthroscopy    . SHOULDER ARTHROSCOPY Right 06/09/2020   Procedure: RIGHT SHOULDER ARTHROSCOPY AND DEBRIDEMENT;  Surgeon: Newt Minion, MD;  Location: [place];  Service: Orthopedics;  Laterality: Right;   Social History   Occupational History  . Not on file  Tobacco Use  . Smoking status: Current Some Day Smoker    Types: Cigarettes    Last attempt to quit: 01/29/2019    Years since quitting: 1.4  . Smokeless tobacco: Never Used  . Tobacco comment: weekend smoker  Vaping Use  . Vaping Use: Never used  Substance and Sexual Activity  . Alcohol use: Yes    Comment: social  . Drug use: No  . Sexual  activity: Not Currently

## 2020-07-15 NOTE — ED Triage Notes (Signed)
Pt reports flu-like sx's since Monday. Pt states that his roommate was exposed to omicron and now he is showing sx's

## 2020-07-18 ENCOUNTER — Other Ambulatory Visit: Payer: Self-pay

## 2020-07-18 DIAGNOSIS — Z79899 Other long term (current) drug therapy: Secondary | ICD-10-CM | POA: Insufficient documentation

## 2020-07-18 DIAGNOSIS — Z7982 Long term (current) use of aspirin: Secondary | ICD-10-CM | POA: Diagnosis not present

## 2020-07-18 DIAGNOSIS — R569 Unspecified convulsions: Secondary | ICD-10-CM | POA: Diagnosis not present

## 2020-07-18 DIAGNOSIS — F17210 Nicotine dependence, cigarettes, uncomplicated: Secondary | ICD-10-CM | POA: Diagnosis not present

## 2020-07-18 DIAGNOSIS — R7303 Prediabetes: Secondary | ICD-10-CM | POA: Diagnosis not present

## 2020-07-18 DIAGNOSIS — M1712 Unilateral primary osteoarthritis, left knee: Secondary | ICD-10-CM | POA: Diagnosis not present

## 2020-07-18 DIAGNOSIS — I10 Essential (primary) hypertension: Secondary | ICD-10-CM | POA: Diagnosis not present

## 2020-07-18 DIAGNOSIS — Z7984 Long term (current) use of oral hypoglycemic drugs: Secondary | ICD-10-CM | POA: Insufficient documentation

## 2020-07-18 DIAGNOSIS — M79605 Pain in left leg: Secondary | ICD-10-CM | POA: Diagnosis not present

## 2020-07-18 LAB — BASIC METABOLIC PANEL
Anion gap: 13 (ref 5–15)
BUN: 19 mg/dL (ref 6–20)
CO2: 24 mmol/L (ref 22–32)
Calcium: 9.8 mg/dL (ref 8.9–10.3)
Chloride: 102 mmol/L (ref 98–111)
Creatinine, Ser: 0.9 mg/dL (ref 0.61–1.24)
GFR, Estimated: >60 mL/min (ref >60)
Glucose, Bld: 144 mg/dL — ABNORMAL HIGH (ref 70–99)
Potassium: 3.6 mmol/L (ref 3.5–5.1)
Sodium: 139 mmol/L (ref 135–145)

## 2020-07-18 LAB — CBC
HCT: 44.3 % (ref 39.0–52.0)
Hemoglobin: 15.1 g/dL (ref 13.0–17.0)
MCH: 31.1 pg (ref 26.0–34.0)
MCHC: 34.1 g/dL (ref 30.0–36.0)
MCV: 91.2 fL (ref 80.0–100.0)
Platelets: 274 10*3/uL (ref 150–400)
RBC: 4.86 MIL/uL (ref 4.22–5.81)
RDW: 12.4 % (ref 11.5–15.5)
WBC: 8.4 10*3/uL (ref 4.0–10.5)
nRBC: 0 % (ref 0.0–0.2)

## 2020-07-18 NOTE — ED Notes (Signed)
Full rainbow sent to lab.  

## 2020-07-18 NOTE — ED Triage Notes (Addendum)
Pt comes pov with focal seizure on left leg. States back of calf was hurting really bad before it started and pain continues now. Pt states "I never lose consciousness, I just shake in my legs". Pt states pain is "in crease of knee like where that vein is". No swelling or redness around area.

## 2020-07-19 ENCOUNTER — Emergency Department: Payer: Medicaid Other

## 2020-07-19 ENCOUNTER — Emergency Department
Admission: EM | Admit: 2020-07-19 | Discharge: 2020-07-19 | Disposition: A | Discharge status: Home / Self Care | Payer: Medicaid Other | Attending: Emergency Medicine | Admitting: Emergency Medicine

## 2020-07-19 ENCOUNTER — Encounter: Payer: Self-pay | Admitting: Radiology

## 2020-07-19 DIAGNOSIS — M1712 Unilateral primary osteoarthritis, left knee: Secondary | ICD-10-CM

## 2020-07-19 DIAGNOSIS — M79605 Pain in left leg: Secondary | ICD-10-CM | POA: Diagnosis not present

## 2020-07-19 LAB — CBG MONITORING, ED: Glucose-Capillary: 83 mg/dL (ref 70–99)

## 2020-07-19 MED ORDER — METHOCARBAMOL 500 MG PO TABS
500.0000 mg | ORAL_TABLET | Freq: Once | ORAL | Status: AC
  Administered 2020-07-19: 500 mg via ORAL
  Filled 2020-07-19 (×2): qty 1

## 2020-07-19 MED ORDER — ACETAMINOPHEN 500 MG PO TABS
1000.0000 mg | ORAL_TABLET | Freq: Once | ORAL | Status: AC
  Administered 2020-07-19: 1000 mg via ORAL
  Filled 2020-07-19: qty 2

## 2020-07-19 NOTE — Discharge Instructions (Addendum)
For pain take 1000mg  every 8 hours and robaxin as prescribed. Follow up with your doctor for further management and treatment. Return to the ER for swelling, redness, warmth of your leg, if your foot feels cold to the touch or your toes are blue or pale in color.

## 2020-07-19 NOTE — ED Notes (Signed)
Pt will wait in lobby for ride.

## 2020-07-19 NOTE — ED Provider Notes (Signed)
[facility] Emergency Department Provider Note  ____________________________________________  Time seen: Approximately 2:57 AM  I have reviewed the triage vital signs and the nursing notes.   HISTORY  Chief Complaint Seizures   HPI Joshua [name] is a 60 y.o. male with a history of partial seizures, meningioma, hypertension, prediabetes who presents for evaluation of left leg pain. Patient is here because of pain behind the knee of his left leg which she has had for several months. He reports that the pain is stabbing and throbbing and comes on both at rest and with weightbearing. He hasn't been able to determine any provoking or alleviating actions. He reports that he did have a seizure earlier today. His seizures are partial and usually include the left lower extremity but he is not here for that. He has been taking his medications like he supposed to. He usually has a couple of seizures a year. Hasn't had one in 3 years. His seizure happened earlier this morning. He was here because he was concerned that he could have a blood clot in his leg causing the pain behind his knee. He does not have a history of DVTs or PEs. He has no chest pain or shortness of breath. He has no numbness or weakness of his legs, no back pain. Right now he reports his pain is 8 out of 10. He reports that the pain usually resolves with muscle relaxants but recurs. He denies any trauma.   Past Medical History:  Diagnosis Date  . Anxiety   . Brain tumor ([place]) 02/20/2013   brain tumor removed in March 2014, Dr Donald Pore  . Diffuse idiopathic skeletal hyperostosis 06/05/2013  . Dizziness   . Enlarged prostate   . GERD (gastroesophageal reflux disease)   . Headache(784.0)    scattered  . Hypertension   . 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[place]) 03/07/2019  . Meningioma ([place])   . Meningioma, recurrent of brain ([place]) 01/31/2019  . Neuropathy 12/05/2019  . Rotator cuff tear    right   . Seizures ([place])    11/27/19 last sz 1 wk ago    Patient Active Problem List   Diagnosis Date Noted  . Essential hypertension 07/02/2020  . Prediabetes 07/02/2020  . Polyuria 07/02/2020  . Nontraumatic complete tear of right rotator cuff   . Impingement syndrome of right shoulder   . Neuropathy 12/05/2019  . 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[place]) 03/07/2019  . Seizures ([place]) 01/31/2019  . Meningioma, recurrent of brain ([place]) 01/31/2019  . Meningioma ([place]) 01/03/2019  . Brain tumor ([place]) 12/27/2018  . Diffuse idiopathic skeletal hyperostosis 06/05/2013    Past Surgical History:  Procedure Laterality Date  . APPLICATION OF CRANIAL NAVIGATION N/A 01/10/2019   Procedure: APPLICATION OF CRANIAL NAVIGATION;  Surgeon: Erline Levine, MD;  Location: [place];  Service: Neurosurgery;  Laterality: N/A;  . CATARACT EXTRACTION Right   . CRANIOTOMY Right 11/06/2012   Procedure: CRANIOTOMY TUMOR EXCISION;  Surgeon: Erline Levine, MD;  Location: [place] [facility];  Service: Neurosurgery;  Laterality: Right;  Right Parasagittal craniotomy for meningioma with Stealth  . CRANIOTOMY Right 01/10/2019   Procedure: Right Parasagittal Craniotomy for Tumor;  Surgeon: Erline Levine, MD;  Location: [place];  Service: Neurosurgery;  Laterality: Right;  Right parasagittal craniotomy for tumor  . ESOPHAGOGASTRODUODENOSCOPY    . right knee arthroscopy    . SHOULDER ARTHROSCOPY Right 06/09/2020   Procedure: RIGHT SHOULDER ARTHROSCOPY AND DEBRIDEMENT;  Surgeon: Newt Minion, MD;  Location: [place];  Service: Orthopedics;  Laterality: Right;    Prior to Admission medications   Medication Sig Start Date End Date Taking? Authorizing Provider  Accu-Chek Softclix Lancets lancets Use as instructed 07/08/20   Mayers, Cari S, PA-C  amLODipine (NORVASC) 5 MG tablet Take 1 tablet (5 mg total) by mouth daily. To lower blood pressure 05/06/20   Fulp, Cammie, MD  aspirin 81 MG chewable tablet     [provider]  atorvastatin (LIPITOR) 20 MG tablet TAKE 1 TABLET BY MOUTH EVERY DAY 06/29/20   Charlott Rakes, MD  Blood Glucose Monitoring Suppl (TRUE METRIX METER) w/Device KIT 1 Units by Does not apply route daily. 07/01/20 07/31/20  Mayers, Cari S, PA-C  clonazePAM (KLONOPIN) 0.5 MG tablet TAKE 1 TABLET(0.5 MG) BY MOUTH TWICE DAILY 05/05/20   Vaslow, Acey Lav, MD  glucose blood (ACCU-CHEK AVIVA PLUS) test strip Use as instructed 07/08/20   Mayers, Cari S, PA-C  HYDROcodone-acetaminophen (NORCO/VICODIN) 5-325 MG tablet Take 1 tablet by mouth every 4 (four) hours as needed for moderate pain. 07/15/20   Persons, Bevely Palmer, PA  Lacosamide 100 MG TABS Take 2 tablets (200 mg total) by mouth in the morning and at bedtime. 06/15/20   [name], MD  lamoTRIgine (LAMICTAL) 100 MG tablet TAKE 2 TABLETS (200 MG TOTAL) BY MOUTH 2 (TWO) TIMES DAILY. 06/29/20   Vaslow, Acey Lav, MD  LORazepam (ATIVAN) 1 MG tablet Take 1 tablet (1 mg total) by mouth every 8 (eight) hours as needed for seizure (for breakthrough seizures). 12/26/19   Vaslow, Acey Lav, MD  metFORMIN (GLUCOPHAGE) 500 MG tablet Take 1 tablet (500 mg total) by mouth 2 (two) times daily with a meal. 07/01/20   Mayers, Cari S, PA-C  methocarbamol (ROBAXIN) 500 MG tablet Take 1 tablet (500 mg total) by mouth every 8 (eight) hours as needed for muscle spasms. 04/22/20   Davonna Belling, MD  mirtazapine (REMERON) 15 MG tablet Take 15 mg by mouth at bedtime as needed (Sleep).     [provider]  Misc. Devices MISC Please provide BP cuff for patient 07/08/20   Mathis Dad  omeprazole (PRILOSEC) 20 MG capsule One pill twice daily- morning and before bedtime to reduce stomach acid 04/15/20   Fulp, Cammie, MD  ondansetron (ZOFRAN) 4 MG tablet Take 1 tablet (4 mg total) by mouth every 8 (eight) hours as needed for nausea or vomiting. 06/01/20   Fulp, Cammie, MD  ondansetron (ZOFRAN-ODT) 4 MG disintegrating tablet Take 1 tablet (4  mg total) by mouth every 8 (eight) hours as needed for nausea or vomiting. 02/04/20   Vaslow, Acey Lav, MD  sucralfate (CARAFATE) 1 g tablet Take 1 tablet (1 g total) by mouth 4 (four) times daily -  with meals and at bedtime. 05/06/20   Fulp, Cammie, MD  VIMPAT 100 MG TABS TAKE 2 TABLETS IN MORNING AND 2 AT BEDTIME 05/11/20   Vaslow, Acey Lav, MD    Allergies Patient has no known allergies.  Family History  Problem Relation Age of Onset  . Hypertension Mother   . Dementia Mother   . Diabetes Mother   . Hypertension Father   . CAD Father   . Diabetes Father   . CAD Brother     Social History Social History   Tobacco Use  . Smoking status: Current Some Day Smoker    Types: Cigarettes    Last attempt to quit: 01/29/2019  Years since quitting: 1.4  . Smokeless tobacco: Never Used  . Tobacco comment: weekend smoker  Vaping Use  . Vaping Use: Never used  Substance Use Topics  . Alcohol use: Yes    Comment: social  . Drug use: No    Review of Systems  Constitutional: Negative for fever. Eyes: Negative for visual changes. ENT: Negative for sore throat. Neck: No neck pain  Cardiovascular: Negative for chest pain. Respiratory: Negative for shortness of breath. Gastrointestinal: Negative for abdominal pain, vomiting or diarrhea. Genitourinary: Negative for dysuria. Musculoskeletal: Negative for back pain. + L knee pain Skin: Negative for rash. Neurological: Negative for headaches, weakness or numbness. Psych: No SI or HI  ____________________________________________   PHYSICAL EXAM:  VITAL SIGNS: ED Triage Vitals  Enc Vitals Group     BP 07/18/20 1736 (!) 143/79     Pulse Rate 07/18/20 1733 96     Resp 07/18/20 1733 18     Temp 07/18/20 1733 98.5 F (36.9 C)     Temp Source 07/18/20 1733 Oral     SpO2 07/18/20 1733 96 %     Weight 07/18/20 1735 180 lb (81.6 kg)     Height 07/18/20 1735 _0  (1.727 m)     Head Circumference --      Peak Flow --      Pain  Score 07/18/20 1735 9     Pain Loc --      Pain Edu? --      Excl. in [place]? --     Constitutional: Alert and oriented. Well appearing and in no apparent distress. HEENT:      Head: Normocephalic and atraumatic.         Eyes: Conjunctivae are normal. Sclera is non-icteric.       Mouth/Throat: Mucous membranes are moist.       Neck: Supple with no signs of meningismus. Cardiovascular: Regular rate and rhythm. No murmurs, gallops, or rubs.  Respiratory: Normal respiratory effort. Lungs are clear to auscultation bilaterally.  Gastrointestinal: Soft, non tender. Musculoskeletal: Full painless range of motion of the hip, ankle, and knee bilaterally, no erythema, no swelling, no tenderness palpation, no obvious deformities. Patient has strong bilateral DP and PT pulses, intact strength and sensation. No edema or asymmetric swelling. Neurologic: Normal speech and language. Face is symmetric. Moving all extremities. No gross focal neurologic deficits are appreciated. Skin: Skin is warm, dry and intact. No rash noted. Psychiatric: Mood and affect are normal. Speech and behavior are normal.  ____________________________________________   LABS (all labs ordered are listed, but only abnormal results are displayed)  Labs Reviewed  BASIC METABOLIC PANEL - Abnormal; Notable for the following components:      Result Value   Glucose, Bld 144 (*)    All other components within normal limits  CBC  CBG MONITORING, ED   ____________________________________________  EKG  none  ____________________________________________  RADIOLOGY  I have personally reviewed the images performed during this visit and I agree with the Radiologist's read.   Interpretation by Radiologist:  US Venous Img Lower Unilateral Left  Result Date: 07/19/2020 CLINICAL DATA:  Left leg pain EXAM: LEFT LOWER EXTREMITY VENOUS DOPPLER ULTRASOUND TECHNIQUE: Gray-scale sonography with compression, as well as color and duplex  ultrasound, were performed to evaluate the deep venous system(s) from the level of the common femoral vein through the popliteal and proximal calf veins. COMPARISON:  None. FINDINGS: VENOUS Normal compressibility of the common femoral, superficial femoral, and popliteal veins, as well  as the visualized calf veins. Visualized portions of profunda femoral vein and great saphenous vein unremarkable. No filling defects to suggest DVT on grayscale or color Doppler imaging. Doppler waveforms show normal direction of venous flow, normal respiratory plasticity and response to augmentation. Limited views of the contralateral common femoral vein are unremarkable. OTHER None. Limitations: none IMPRESSION: Negative. Electronically Signed   By: Fidela Salisbury MD   On: 07/19/2020 02:28   DG Knee Complete 4 Views Left  Result Date: 07/19/2020 CLINICAL DATA:  Left knee pain EXAM: LEFT KNEE - COMPLETE 4+ VIEW COMPARISON:  None. FINDINGS: Four view radiograph left knee demonstrates normal alignment. No fracture or dislocation. Moderate to severe tricompartmental degenerative arthritis, most severe within the medial compartment. No effusion. Soft tissues are unremarkable. IMPRESSION: Moderate to severe degenerative arthritis. Electronically Signed   By: Fidela Salisbury MD   On: 07/19/2020 02:56     ____________________________________________   PROCEDURES  Procedure(s) performed: None Procedures Critical Care performed:  None ____________________________________________   INITIAL IMPRESSION / ASSESSMENT AND PLAN / ED COURSE  60 y.o. male with a history of partial seizures, meningioma, hypertension, prediabetes who presents for evaluation of chronic left knee pain. Exam is unremarkable with no signs of septic joint, inflammatory joint, trauma, cellulitis, ischemia, asymmetric edema. X-ray visualized by me showing severe changes of arthritis, confirmed by radiology. Patient was concerned for a blood clot therefore a  Doppler was done which is negative for DVT. Recommended Tylenol 1000 mg 3 times daily and continue muscle relaxants for pain. Recommended follow-up with PCP. Discussed my standard return precautions . Labs reviewed by me with no acute findings. Old medical records reviewed.       _____________________________________________ Please note:  Patient was evaluated in Emergency Department today for the symptoms described in the history of present illness. Patient was evaluated in the context of the global COVID-19 pandemic, which necessitated consideration that the patient might be at risk for infection with the SARS-CoV-2 virus that causes COVID-19. Institutional protocols and algorithms that pertain to the evaluation of patients at risk for COVID-19 are in a state of rapid change based on information released by regulatory bodies including the CDC and federal and state organizations. These policies and algorithms were followed during the patient's care in the ED.  Some ED evaluations and interventions may be delayed as a result of limited staffing during the pandemic.   [place] Controlled Substance Database was reviewed by me. ____________________________________________   FINAL CLINICAL IMPRESSION(S) / ED DIAGNOSES   Final diagnoses:  Arthritis of left knee      NEW MEDICATIONS STARTED DURING THIS VISIT:  ED Discharge Orders    None       Note:  This document was prepared using Dragon voice recognition software and may include unintentional dictation errors.    Alfred Levins, [place], MD 07/19/20 [phone]

## 2020-07-22 ENCOUNTER — Ambulatory Visit: Payer: Medicaid Other | Attending: Family | Admitting: Physical Therapy

## 2020-07-22 ENCOUNTER — Other Ambulatory Visit: Payer: Self-pay

## 2020-07-22 ENCOUNTER — Encounter: Payer: Self-pay | Admitting: Physical Therapy

## 2020-07-22 DIAGNOSIS — M25511 Pain in right shoulder: Secondary | ICD-10-CM | POA: Insufficient documentation

## 2020-07-22 DIAGNOSIS — M25551 Pain in right hip: Secondary | ICD-10-CM | POA: Insufficient documentation

## 2020-07-22 DIAGNOSIS — M6281 Muscle weakness (generalized): Secondary | ICD-10-CM | POA: Diagnosis not present

## 2020-07-22 DIAGNOSIS — R600 Localized edema: Secondary | ICD-10-CM | POA: Diagnosis not present

## 2020-07-22 DIAGNOSIS — R2689 Other abnormalities of gait and mobility: Secondary | ICD-10-CM | POA: Diagnosis not present

## 2020-07-22 DIAGNOSIS — G8929 Other chronic pain: Secondary | ICD-10-CM

## 2020-07-22 DIAGNOSIS — M25651 Stiffness of right hip, not elsewhere classified: Secondary | ICD-10-CM | POA: Diagnosis not present

## 2020-07-22 NOTE — Therapy (Signed)
[facility] Outpatient Rehabilitation [facility] [address] [place], [place], [address] Phone: [phone]   Fax:  [phone]  Physical Therapy Treatment  Patient Details  Name: Joshua [name] MRN: [id number] Date of Birth: [birth year] Referring Provider (PT): Adonis Huguenin, NP   Encounter Date: 07/22/2020   PT End of Session - 07/22/20 1427    Visit Number 2    Number of Visits 13    Date for PT Re-Evaluation 09/03/20    Authorization Type [place] MCD - submitted on 07/09/2020    PT Start Time 1400    PT Stop Time 1445    PT Time Calculation (min) 45 min    Activity Tolerance Patient tolerated treatment well;Patient limited by pain    Behavior During Therapy [facility] for tasks assessed/performed           Past Medical History:  Diagnosis Date  . Anxiety   . Brain tumor ([facility]) 02/20/2013   brain tumor removed in March 2014, Dr Rush Farmer  . Diffuse idiopathic skeletal hyperostosis 06/05/2013  . Dizziness   . Enlarged prostate   . GERD (gastroesophageal reflux disease)   . Headache(784.0)    scattered  . Hypertension   . 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  . Meningioma ([facility])   . Meningioma, recurrent of brain ([facility]) 01/31/2019  . Neuropathy 12/05/2019  . Rotator cuff tear    right  . Seizures ([facility])    11/27/19 last sz 1 wk ago    Past Surgical History:  Procedure Laterality Date  . APPLICATION OF CRANIAL NAVIGATION N/A 01/10/2019   Procedure: APPLICATION OF CRANIAL NAVIGATION;  Surgeon: Maeola Harman, MD;  [facility]: [facility];  Service: Neurosurgery;  Laterality: N/A;  . CATARACT EXTRACTION Right   . CRANIOTOMY Right 11/06/2012   Procedure: CRANIOTOMY TUMOR EXCISION;  Surgeon: Maeola Harman, MD;  Location: [facility];  Service: Neurosurgery;  Laterality: Right;  Right Parasagittal craniotomy for meningioma with Stealth  . CRANIOTOMY Right 01/10/2019   Procedure: Right Parasagittal Craniotomy for Tumor;  Surgeon: Maeola Harman, MD;  Location:  [facility] OR;  Service: Neurosurgery;  Laterality: Right;  Right parasagittal craniotomy for tumor  . ESOPHAGOGASTRODUODENOSCOPY    . right knee arthroscopy    . SHOULDER ARTHROSCOPY Right 06/09/2020   Procedure: RIGHT SHOULDER ARTHROSCOPY AND DEBRIDEMENT;  Surgeon: Nadara Mustard, MD;  Location: [facility];  Service: Orthopedics;  Laterality: Right;    There were no vitals filed for this visit.   Subjective Assessment - 07/22/20 1405    Subjective Doing the exercises daily "as much as I can tolerated'. Went to ED and they told me my knee is bad.  I need a brace for it.    Currently in Pain? Yes    Pain Score 7     Pain Location Shoulder    Pain Orientation Right;Anterior;Proximal    Pain Descriptors / Indicators Aching;Sharp;Dull    Pain Type Acute pain;Surgical pain    Pain Onset More than a month ago    Pain Frequency Intermittent    Aggravating Factors  activity ,    Pain Relieving Factors medicine a bit    Multiple Pain Sites No              [facility] PT Assessment - 07/22/20 0001      AROM   Right Shoulder Flexion 130 Degrees   pulleys   Right Shoulder ABduction 100 Degrees   pulleys     PROM   Right  Shoulder External Rotation 45 Degrees              [facility] Adult PT Treatment/Exercise - 07/22/20 0001      Shoulder Exercises: Supine   External Rotation Weight (lbs) cane x 15    Shoulder Flexion Weight (lbs) chest press to overhead x 10 each with cane    Flexion Limitations 2 sets      Shoulder Exercises: Seated   Retraction Weight (lbs) x 15    External Rotation Weight (lbs) x 15 AROM, cues    Other Seated Exercises external rotation yellow band x 15      Shoulder Exercises: Pulleys   Flexion 3 minutes    Scaption 3 minutes      Shoulder Exercises: Stretch   Table Stretch - Flexion 5 reps;20 seconds    Table Stretch - Abduction 20 seconds;5 reps    Table Stretch - External Rotation 5 reps;20 seconds      Manual Therapy   Manual Therapy Passive  ROM    Passive ROM ER, flexion and abduction to tolerance                  PT Education - 07/22/20 1427    Education Details HEP modifcations    Person(s) Educated Patient    Methods Explanation;Handout    Comprehension Verbalized understanding;Returned demonstration            PT Short Term Goals - 07/09/20 1113      PT SHORT TERM GOAL #1   Title pt to be IND with inital HEP    Baseline no previous HEP    Time 3    Period Weeks    Status New    Target Date 07/30/20      PT SHORT TERM GOAL #2   Title pt to verbalize techniques to reduce R shoulder pain via RICE and HEP    Baseline no previous knowledge of pain reduction outside of medication    Time 3    Period Weeks    Status New      PT SHORT TERM GOAL #3   Title pt to increaes R shoulder PROM/ AAROM flexion by >/= 15 degrees to promote functional mobility    Baseline PROM flexion 95 degrees, 100 degres abduction    Time 3    Period Weeks    Status New    Target Date 07/30/20             PT Long Term Goals - 07/09/20 1115      PT LONG TERM GOAL #1   Title increase R shoulder AROM flexion/ abduction to >/= 120 degrees and IR/ER with functional limits compard bil with </= 2/10 max pain    Baseline flexion AROM 78, abduction 68 with 7/10 pain    Time 6    Period Weeks    Status New    Target Date 09/03/20      PT LONG TERM GOAL #2   Title pt to increase R shoulder strength to >/= 4/5 to promote stability and  functional lifting/ carrying activities    Baseline unable to test strength due to irritabiity at evaluation    Time 6    Period Weeks    Status New    Target Date 09/03/20      PT LONG TERM GOAL #3   Title pt to be able to lift/ lower >/= 8# to and from and overhead shelf carry >/= 10# in RUE with </=  2/10 max pain for functional strength    Time 6    Period Weeks    Status New    Target Date 09/03/20      PT LONG TERM GOAL #4   Title pt to be IND with all HEP given and is able to  maintain and progress her current LOF IND.    Baseline no previous HEP    Time 6    Period Weeks    Status New    Target Date 09/03/20      PT LONG TERM GOAL #5   Title -    Baseline -                 Plan - 07/22/20 1405    Clinical Impression Statement Patient did fairly well today, given alternatives for HEP as standing AROM was not available.  WIth pulleys able to get up to 120-130 deg flexion albeit with neck involvment.  Lowered his cane to reduce neck tension as well.  Clips toe on the floor when walking , guard with transfers and ambulation.    PT Treatment/Interventions ADLs/Self Care Home Management;Cryotherapy;Gait training;Stair training;Therapeutic activities;Therapeutic exercise;Neuromuscular re-education;Patient/family education;Manual techniques;Dry needling;Taping;Vasopneumatic Device    PT Next Visit Plan review/ update HEP add bands. PRn, Shoulde rmobs/ PROM, continued working on [employer], scapular setting, modalities for pain (no heat/ E-stim hx of    PT Home Exercise Plan [id number] - pendulums, Shoulder wand flexion/ abduction, scapular retraction, upper trap stretch, table slides    Consulted and Agree with Plan of Care Patient           Patient will benefit from skilled therapeutic intervention in order to improve the following deficits and impairments:  Improper body mechanics,Increased muscle spasms,Decreased strength,Postural dysfunction,Pain,Decreased activity tolerance,Decreased endurance,Increased edema  Visit Diagnosis: Chronic right shoulder pain  Muscle weakness (generalized)  Localized edema  Other abnormalities of gait and mobility  Pain in right hip     Problem List Patient Active Problem List   Diagnosis Date Noted  . Essential hypertension 07/02/2020  . Prediabetes 07/02/2020  . Polyuria 07/02/2020  . Nontraumatic complete tear of right rotator cuff   . Impingement syndrome of right shoulder   . Neuropathy 12/05/2019  .  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  . Seizures ([facility]) 01/31/2019  . Meningioma, recurrent of brain ([facility]) 01/31/2019  . Meningioma ([facility]) 01/03/2019  . Brain tumor ([facility]) 12/27/2018  . Diffuse idiopathic skeletal hyperostosis 06/05/2013    PAA,JENNIFER 07/22/2020, 2:52 PM  [facility] [address] [place], [place], [address] Phone: [phone]   Fax:  [phone]  Name: Joshua [name] MRN: [id number] Date of Birth: 26-Jul-1960  Karie Mainland, PT 07/22/20 2:52 PM Phone: [phone] Fax: [phone]

## 2020-07-23 ENCOUNTER — Ambulatory Visit (INDEPENDENT_AMBULATORY_CARE_PROVIDER_SITE_OTHER): Payer: Medicaid Other | Admitting: Urology

## 2020-07-23 ENCOUNTER — Encounter: Payer: Self-pay | Admitting: Urology

## 2020-07-23 VITALS — BP 143/88 | HR 83 | Ht 68.0 in | Wt 196.4 lb

## 2020-07-23 DIAGNOSIS — Z125 Encounter for screening for malignant neoplasm of prostate: Secondary | ICD-10-CM | POA: Diagnosis not present

## 2020-07-23 DIAGNOSIS — R3589 Other polyuria: Secondary | ICD-10-CM

## 2020-07-23 DIAGNOSIS — R351 Nocturia: Secondary | ICD-10-CM

## 2020-07-23 DIAGNOSIS — N401 Enlarged prostate with lower urinary tract symptoms: Secondary | ICD-10-CM | POA: Diagnosis not present

## 2020-07-23 LAB — BLADDER SCAN AMB NON-IMAGING

## 2020-07-23 MED ORDER — TAMSULOSIN HCL 0.4 MG PO CAPS: 0.4000 mg | ORAL_CAPSULE | Freq: Every day | ORAL | 3 refills | Status: DC

## 2020-07-23 NOTE — Progress Notes (Signed)
07/23/20 6:54 PM   Joshua [name] 10-20-1960 [id number]  CC: Urinary symptoms, PSA screening  HPI: This a Joshua [name] in urology clinic for the above issues.  He is a 60 year old male with a history of brain tumor who reports some mild urinary symptoms of nocturia once per night that is new.  He also has some mild urinary urgency, and sensation of incomplete emptying.  He is concerned this could be prostate cancer.  His most recent PSA is normal at 3.2 from March 2021.  He denies any dysuria or other significant urinary symptoms during the day.  IPSS score today is 12.  PVR is normal at 10 mL.  He denies any gross hematuria or UTIs.  He denies a family history of prostate cancer.   PMH: Past Medical History:  Diagnosis Date  . Anxiety   . Brain tumor ([place]) 02/20/2013   brain tumor removed in March 2014, Dr Donald Pore  . Diffuse idiopathic skeletal hyperostosis 06/05/2013  . Dizziness   . Enlarged prostate   . GERD (gastroesophageal reflux disease)   . Headache(784.0)    scattered  . Hypertension   . 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 Meningioma ([place])   . Meningioma, recurrent of brain ([place]) 01/31/2019  . Neuropathy 12/05/2019  . Rotator cuff tear    right  . Seizures ([place])    11/27/19 last sz 1 wk ago    Surgical History: Past Surgical History:  Procedure Laterality Date  . APPLICATION OF CRANIAL NAVIGATION N/A 01/10/2019   Procedure: APPLICATION OF CRANIAL NAVIGATION;  Surgeon: Erline Levine, MD;  Location: [facility];  Service: Neurosurgery;  Laterality: N/A;  . CATARACT EXTRACTION Right   . CRANIOTOMY Right 11/06/2012   Procedure: CRANIOTOMY TUMOR EXCISION;  Surgeon: Erline Levine, MD;  Location: [facility];  Service: Neurosurgery;  Laterality: Right;  Right Parasagittal craniotomy for meningioma with Stealth  . CRANIOTOMY Right 01/10/2019   Procedure: Right Parasagittal Craniotomy for Tumor;  Surgeon: Erline Levine, MD;  Location: [facility];  Service:  Neurosurgery;  Laterality: Right;  Right parasagittal craniotomy for tumor  . ESOPHAGOGASTRODUODENOSCOPY    . right knee arthroscopy    . SHOULDER ARTHROSCOPY Right 06/09/2020   Procedure: RIGHT SHOULDER ARTHROSCOPY AND DEBRIDEMENT;  Surgeon: Newt Minion, MD;  Location: [facility];  Service: Orthopedics;  Laterality: Right;   Family History: Family History  Problem Relation Age of Onset  . Hypertension Mother   . Dementia Mother   . Diabetes Mother   . Hypertension Father   . CAD Father   . Diabetes Father   . CAD Brother     Social History:  reports that he has been smoking cigarettes. He has never used smokeless tobacco. He reports current alcohol use. He reports that he does not use drugs.  Physical Exam: BP (!) 143/88 (BP Location: Left Arm, Patient Position: Sitting, Cuff Size: Normal)   Pulse 83   Ht 5\' 8"  (1.727 m)   Wt 196 lb 6.4 oz (89.1 kg)   BMI 29.86 kg/m    Constitutional:  Alert and oriented, No acute distress. Cardiovascular: No clubbing, cyanosis, or edema. Respiratory: Normal respiratory effort, no increased work of breathing. GI: Abdomen is soft, nontender, nondistended, no abdominal masses DRE: 30 g, smooth, no nodules or masses  Laboratory Data: Reviewed, see HPI  Pertinent Imaging: I have personally viewed and interpreted the CT abdomen pelvis from 03/10/2020 that shows a 35 grams prostate and no hydronephrosis  or other urologic abnormalities.  Assessment & Plan:   60 year old male with mild urinary symptoms of feeling of incomplete emptying and nocturia once per night with mild urgency during the day, normal PVR, normal urinalysis, normal PSA, normal DRE.  We reviewed behavioral strategies regarding nocturia, and that urination once per night is common in most men.  Behavioral strategies were discussed at length, he is interested in a trial of Flomax which is not unreasonable.  Return precautions discussed at length.  Trial of  Flomax Continue yearly PSA screening with PCP RTC 4 months with PA symptom check  Legrand Rams, MD 07/23/2020  [facility] [address] [place], [place] [address] [phone]

## 2020-07-23 NOTE — Patient Instructions (Signed)

## 2020-07-24 LAB — URINALYSIS, COMPLETE
Bilirubin, UA: NEGATIVE
Glucose, UA: NEGATIVE
Ketones, UA: NEGATIVE
Leukocytes,UA: NEGATIVE
Nitrite, UA: NEGATIVE
RBC, UA: NEGATIVE
Specific Gravity, UA: 1.025 (ref 1.005–1.030)
Urobilinogen, Ur: 0.2 mg/dL (ref 0.2–1.0)
pH, UA: 5 (ref 5.0–7.5)

## 2020-07-24 LAB — MICROSCOPIC EXAMINATION
Bacteria, UA: NONE SEEN
Epithelial Cells (non renal): NONE SEEN /hpf (ref 0–10)
RBC, Urine: NONE SEEN /hpf (ref 0–2)

## 2020-07-27 ENCOUNTER — Ambulatory Visit: Payer: Self-pay | Admitting: Urology

## 2020-07-28 ENCOUNTER — Telehealth: Payer: Self-pay | Admitting: Family Medicine

## 2020-07-28 ENCOUNTER — Other Ambulatory Visit: Payer: Self-pay

## 2020-07-28 ENCOUNTER — Ambulatory Visit: Payer: Medicaid Other | Admitting: Physical Therapy

## 2020-07-28 ENCOUNTER — Encounter: Payer: Self-pay | Admitting: Physical Therapy

## 2020-07-28 ENCOUNTER — Ambulatory Visit: Payer: Self-pay

## 2020-07-28 VITALS — BP 138/82

## 2020-07-28 DIAGNOSIS — R2689 Other abnormalities of gait and mobility: Secondary | ICD-10-CM | POA: Diagnosis not present

## 2020-07-28 DIAGNOSIS — M6281 Muscle weakness (generalized): Secondary | ICD-10-CM

## 2020-07-28 DIAGNOSIS — R600 Localized edema: Secondary | ICD-10-CM | POA: Diagnosis not present

## 2020-07-28 DIAGNOSIS — M25551 Pain in right hip: Secondary | ICD-10-CM | POA: Diagnosis not present

## 2020-07-28 DIAGNOSIS — M25651 Stiffness of right hip, not elsewhere classified: Secondary | ICD-10-CM | POA: Diagnosis not present

## 2020-07-28 DIAGNOSIS — G8929 Other chronic pain: Secondary | ICD-10-CM

## 2020-07-28 DIAGNOSIS — M25511 Pain in right shoulder: Secondary | ICD-10-CM | POA: Diagnosis not present

## 2020-07-28 NOTE — Telephone Encounter (Signed)
Copied from [place]  [phone]. Topic: General - Inquiry >> Jul 28, 2020 12:50 PM Greggory Keen D wrote: Reason for CRM: Pt called saying he went to PT and they took his BP and it was 137/83.  They told him his cuff was too small on his blood pressure kit. He is going to try to get it replaced so his reading will be more accurate.  CB#  [id number]

## 2020-07-28 NOTE — Telephone Encounter (Signed)
Noted I will let pt cancel appointment if he does not need it.

## 2020-07-28 NOTE — Telephone Encounter (Signed)
Pt is wanting to know if Flomax can cause his BP to elevate.

## 2020-07-28 NOTE — Telephone Encounter (Signed)
  Pt. States he started Flomax recently. Noticed over the weekend his BP was up and down. This morning 180/90.No symptoms. Wants to know if new medicine could be causing this. Please advise. Answer Assessment - Initial Assessment Questions 1. BLOOD PRESSURE: "What is the blood pressure?" "Did you take at least two measurements 5 minutes apart?"     160/90 2. ONSET: "When did you take your blood pressure?"     This morning 3. HOW: "How did you obtain the blood pressure?" (e.g., visiting nurse, automatic home BP monitor)     Home cuff 4. HISTORY: "Do you have a history of high blood pressure?"     Yes 5. MEDICATIONS: "Are you taking any medications for blood pressure?" "Have you missed any doses recently?"     No missed doses. 6. OTHER SYMPTOMS: "Do you have any symptoms?" (e.g., headache, chest pain, blurred vision, difficulty breathing, weakness)     No 7. PREGNANCY: "Is there any chance you are pregnant?" "When was your last menstrual period?"     n/a  Protocols used: BLOOD PRESSURE - HIGH-A-AH

## 2020-07-28 NOTE — Telephone Encounter (Signed)
No it should not. He can schedule an office visit for evaluation of his BP.

## 2020-07-28 NOTE — Telephone Encounter (Signed)
Pt was set an appointment for 07/30/20 with [facility] to discuss BP.

## 2020-07-28 NOTE — Therapy (Addendum)
[place], [place], [address] Phone: [phone]   Fax:  [phone]  Physical Therapy Treatment  Patient Details  Name: Joshua [name] MRN: [id number] Date of Birth: 1960-09-28 Referring Provider (PT): Suzan Slick, NP   Encounter Date: 07/28/2020   PT End of Session - 07/28/20 1200    Visit Number 3    Number of Visits 13    Date for PT Re-Evaluation 09/03/20    Authorization Type [facility] - submitted on 07/09/2020 , approved 3 visits 07/21/20 through 08/10/20   PT Start Time 1145    PT Stop Time 1230    PT Time Calculation (min) 45 min           Past Medical History:  Diagnosis Date  . Anxiety   . Brain tumor ([place]) 02/20/2013   brain tumor removed in March 2014, Dr Donald Pore  . Diffuse idiopathic skeletal hyperostosis 06/05/2013  . Dizziness   . Enlarged prostate   . GERD (gastroesophageal reflux disease)   . Headache(784.0)    scattered  . Hypertension   . 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 Meningioma ([place])   . Meningioma, recurrent of brain ([place]) 01/31/2019  . Neuropathy 12/05/2019  . Rotator cuff tear    right  . Seizures ([place])    11/27/19 last sz 1 wk ago    Past Surgical History:  Procedure Laterality Date  . APPLICATION OF CRANIAL NAVIGATION N/A 01/10/2019   Procedure: APPLICATION OF CRANIAL NAVIGATION;  Surgeon: Erline Levine, MD;  Location: [facility];  Service: Neurosurgery;  Laterality: N/A;  . CATARACT EXTRACTION Right   . CRANIOTOMY Right 11/06/2012   Procedure: CRANIOTOMY TUMOR EXCISION;  Surgeon: Erline Levine, MD;  Location: [facility];  Service: Neurosurgery;  Laterality: Right;  Right Parasagittal craniotomy for meningioma with Stealth  . CRANIOTOMY Right 01/10/2019   Procedure: Right Parasagittal Craniotomy for Tumor;  Surgeon: Erline Levine, MD;  Location: [facility];  Service: Neurosurgery;  Laterality: Right;  Right parasagittal craniotomy for tumor  .  ESOPHAGOGASTRODUODENOSCOPY    . right knee arthroscopy    . SHOULDER ARTHROSCOPY Right 06/09/2020   Procedure: RIGHT SHOULDER ARTHROSCOPY AND DEBRIDEMENT;  Surgeon: Newt Minion, MD;  Location: [facility];  Service: Orthopedics;  Laterality: Right;    Vitals:   07/28/20 1157  BP: 138/82     Subjective Assessment - 07/28/20 1151    Subjective Shoulder hurts the most at night. I called my primary and told them I was having trouble with my BP.    Currently in Pain? Yes    Pain Location Shoulder    Pain Orientation Right;Anterior    Pain Descriptors / Indicators Aching;Dull    Pain Type Acute pain;Surgical pain    Pain Onset More than a month ago    Aggravating Factors  sleeping    Pain Relieving Factors meds some                             [facility] Adult PT Treatment/Exercise - 07/28/20 0001      Shoulder Exercises: Supine   External Rotation Weight (lbs) cane x 15    Shoulder Flexion Weight (lbs) chest press to overhead x 10 each with cane    Flexion Limitations 1 set, then 1 set pullovers with cane    Other Supine Exercises supine AROM punch x 5 with tactile cues to guide  Shoulder Exercises: Seated   Retraction Weight (lbs) x 15    Retraction Limitations cues for posture      Shoulder Exercises: Sidelying   External Rotation 15 reps    External Rotation Limitations towel roll      Shoulder Exercises: Standing   Other Standing Exercises attempted seated/standing cane flexion- pt demonstrates compensation and increased pain so disc.      Shoulder Exercises: Pulleys   Flexion 3 minutes    Scaption 3 minutes      Shoulder Exercises: Isometric Strengthening   Other Isometric Exercises manual isometrics ER. IR, 5 sec x 10, then extension into towel                    PT Short Term Goals - 07/09/20 1113      PT SHORT TERM GOAL #1   Title pt to be IND with inital HEP    Baseline no previous HEP    Time 3    Period Weeks     Status New    Target Date 07/30/20      PT SHORT TERM GOAL #2   Title pt to verbalize techniques to reduce R shoulder pain via RICE and HEP    Baseline no previous knowledge of pain reduction outside of medication    Time 3    Period Weeks    Status New      PT SHORT TERM GOAL #3   Title pt to increaes R shoulder PROM/ AAROM flexion by >/= 15 degrees to promote functional mobility    Baseline PROM flexion 95 degrees, 100 degres abduction    Time 3    Period Weeks    Status New    Target Date 07/30/20             PT Long Term Goals - 07/09/20 1115      PT LONG TERM GOAL #1   Title increase R shoulder AROM flexion/ abduction to >/= 120 degrees and IR/ER with functional limits compard bil with </= 2/10 max pain    Baseline flexion AROM 78, abduction 68 with 7/10 pain    Time 6    Period Weeks    Status New    Target Date 09/03/20      PT LONG TERM GOAL #2   Title pt to increase R shoulder strength to >/= 4/5 to promote stability and  functional lifting/ carrying activities    Baseline unable to test strength due to irritabiity at evaluation    Time 6    Period Weeks    Status New    Target Date 09/03/20      PT LONG TERM GOAL #3   Title pt to be able to lift/ lower >/= 8# to and from and overhead shelf carry >/= 10# in RUE with </= 2/10 max pain for functional strength    Time 6    Period Weeks    Status New    Target Date 09/03/20      PT LONG TERM GOAL #4   Title pt to be IND with all HEP given and is able to maintain and progress her current LOF IND.    Baseline no previous HEP    Time 6    Period Weeks    Status New    Target Date 09/03/20      PT LONG TERM GOAL #5   Title -    Baseline -  Plan - 07/28/20 1233    Clinical Impression Statement Pt arrives with questions about his BP monitor. BP with manaul device 138/82 and with his automatic device 159/92. Recommended he communicate with MD about inaccuracy of his device.  Continued with [place] ROM. Attempted seated/standing AAROM however pt demonstrates significant compensations so retruned to supine with cane and he did well. Began isometrics and sidelying ER AROM. Some pain with end range AAROM and with eccentric lowering.    PT Next Visit Plan Request more medicaid visits next treatment; review/ update HEP add bands. PRn, Shoulde rmobs/ PROM, continued working on [employer], scapular setting, modalities for pain (no heat/ E-stim hx of    PT Home Exercise Plan [email or web address] - pendulums, Shoulder wand flexion/ abduction, scapular retraction, upper trap stretch, table slides           Patient will benefit from skilled therapeutic intervention in order to improve the following deficits and impairments:  Improper body mechanics,Increased muscle spasms,Decreased strength,Postural dysfunction,Pain,Decreased activity tolerance,Decreased endurance,Increased edema  Visit Diagnosis: Chronic right shoulder pain  Muscle weakness (generalized)     Problem List Patient Active Problem List   Diagnosis Date Noted  . Essential hypertension 07/02/2020  . Prediabetes 07/02/2020  . Polyuria 07/02/2020  . Nontraumatic complete tear of right rotator cuff   . Impingement syndrome of right shoulder   . Neuropathy 12/05/2019  . 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 Seizures ([place]) 01/31/2019  . Meningioma, recurrent of brain ([place]) 01/31/2019  . Meningioma ([place]) 01/03/2019  . Brain tumor ([place]) 12/27/2018  . Diffuse idiopathic skeletal hyperostosis 06/05/2013    Dorene Ar , PTA 07/28/2020, 12:40 PM  [facility] [address] [place], [place], [address] Phone: [phone]   Fax:  [phone]  Name: Joshua [name] MRN: [id number] Date of Birth: 1960/11/04

## 2020-07-30 ENCOUNTER — Ambulatory Visit: Payer: Medicaid Other | Admitting: Internal Medicine

## 2020-07-31 ENCOUNTER — Encounter: Payer: Self-pay | Admitting: Physical Therapy

## 2020-07-31 ENCOUNTER — Ambulatory Visit: Payer: Medicaid Other | Attending: Family Medicine | Admitting: Internal Medicine

## 2020-07-31 ENCOUNTER — Ambulatory Visit: Payer: Medicaid Other | Admitting: Physical Therapy

## 2020-07-31 ENCOUNTER — Other Ambulatory Visit: Payer: Self-pay

## 2020-07-31 VITALS — BP 142/88

## 2020-07-31 DIAGNOSIS — M25651 Stiffness of right hip, not elsewhere classified: Secondary | ICD-10-CM | POA: Diagnosis not present

## 2020-07-31 DIAGNOSIS — M6281 Muscle weakness (generalized): Secondary | ICD-10-CM

## 2020-07-31 DIAGNOSIS — R600 Localized edema: Secondary | ICD-10-CM | POA: Diagnosis not present

## 2020-07-31 DIAGNOSIS — I10 Essential (primary) hypertension: Secondary | ICD-10-CM | POA: Diagnosis not present

## 2020-07-31 DIAGNOSIS — G8929 Other chronic pain: Secondary | ICD-10-CM | POA: Diagnosis not present

## 2020-07-31 DIAGNOSIS — M25551 Pain in right hip: Secondary | ICD-10-CM | POA: Diagnosis not present

## 2020-07-31 DIAGNOSIS — R7303 Prediabetes: Secondary | ICD-10-CM

## 2020-07-31 DIAGNOSIS — R110 Nausea: Secondary | ICD-10-CM

## 2020-07-31 DIAGNOSIS — R2689 Other abnormalities of gait and mobility: Secondary | ICD-10-CM | POA: Diagnosis not present

## 2020-07-31 DIAGNOSIS — M25511 Pain in right shoulder: Secondary | ICD-10-CM | POA: Diagnosis not present

## 2020-07-31 MED ORDER — ONDANSETRON HCL 4 MG PO TABS
4.0000 mg | ORAL_TABLET | Freq: Three times a day (TID) | ORAL | 0 refills | Status: DC | PRN
Start: 2020-07-31 — End: 2021-01-11

## 2020-07-31 NOTE — Assessment & Plan Note (Signed)
Lab Results  Component Value Date   HGBA1C 6.2 (A) 07/01/2020   Controlled, continue current meds

## 2020-07-31 NOTE — Progress Notes (Signed)
F/u for BP States his BP readings have been elevated.  SBP- 150's  Patient has a long hx of htn. He has started monitoring it at hojme but he now knows that he was reading the numbers incorrectly. It sounds like he was reading the top number during inflation of the cuff and the diastolic number at the end of deflation.   Today's bp. BP: 133/84  Patient has prediabetes:  Lab Results  Component Value Date   HGBA1C 6.2 (A) 07/01/2020   Past Medical History:  Diagnosis Date  . Anxiety   . Brain tumor ([place]) 02/20/2013   brain tumor removed in March 2014, Dr Donald Pore  . Diffuse idiopathic skeletal hyperostosis 06/05/2013  . Dizziness   . Enlarged prostate   . GERD (gastroesophageal reflux disease)   . Headache(784.0)    scattered  . Hypertension   . 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 Meningioma ([place])   . Meningioma, recurrent of brain ([place]) 01/31/2019  . Neuropathy 12/05/2019  . Rotator cuff tear    right  . Seizures ([place])    11/27/19 last sz 1 wk ago    Social History   Socioeconomic History  . Marital status: Legally Separated    Spouse name: Not on file  . Number of children: 2  . Years of education: Not on file  . Highest education level: Not on file  Occupational History  . Not on file  Tobacco Use  . Smoking status: Current Some Day Smoker    Types: Cigarettes    Last attempt to quit: 01/29/2019    Years since quitting: 1.5  . Smokeless tobacco: Never Used  . Tobacco comment: weekend smoker  Vaping Use  . Vaping Use: Never used  Substance and Sexual Activity  . Alcohol use: Yes    Comment: social  . Drug use: No  . Sexual activity: Not Currently  Other Topics Concern  . Not on file  Social History Narrative      Caffeine- soda occass   Social Determinants of Health   Financial Resource Strain: Not on file  Food Insecurity: Not on file  Transportation Needs: Not on file  Physical Activity: Not on file  Stress: Not on file   Social Connections: Not on file  Intimate Partner Violence: Not on file    Past Surgical History:  Procedure Laterality Date  . APPLICATION OF CRANIAL NAVIGATION N/A 01/10/2019   Procedure: APPLICATION OF CRANIAL NAVIGATION;  Surgeon: Erline Levine, MD;  Location: [facility];  Service: Neurosurgery;  Laterality: N/A;  . CATARACT EXTRACTION Right   . CRANIOTOMY Right 11/06/2012   Procedure: CRANIOTOMY TUMOR EXCISION;  Surgeon: Erline Levine, MD;  Location: [facility];  Service: Neurosurgery;  Laterality: Right;  Right Parasagittal craniotomy for meningioma with Stealth  . CRANIOTOMY Right 01/10/2019   Procedure: Right Parasagittal Craniotomy for Tumor;  Surgeon: Erline Levine, MD;  Location: [facility];  Service: Neurosurgery;  Laterality: Right;  Right parasagittal craniotomy for tumor  . ESOPHAGOGASTRODUODENOSCOPY    . right knee arthroscopy    . SHOULDER ARTHROSCOPY Right 06/09/2020   Procedure: RIGHT SHOULDER ARTHROSCOPY AND DEBRIDEMENT;  Surgeon: Newt Minion, MD;  Location: [facility];  Service: Orthopedics;  Laterality: Right;    Family History  Problem Relation Age of Onset  . Hypertension Mother   . Dementia Mother   . Diabetes Mother   . Hypertension Father   . CAD Father   . Diabetes Father   .  CAD Brother     No Known Allergies  Current Outpatient Medications on File Prior to Visit  Medication Sig Dispense Refill  . amLODipine (NORVASC) 5 MG tablet Take 1 tablet (5 mg total) by mouth daily. To lower blood pressure 30 tablet 4  . aspirin 81 MG chewable tablet     . atorvastatin (LIPITOR) 20 MG tablet TAKE 1 TABLET BY MOUTH EVERY DAY 30 tablet 0  . clonazePAM (KLONOPIN) 0.5 MG tablet TAKE 1 TABLET(0.5 MG) BY MOUTH TWICE DAILY 60 tablet 3  . HYDROcodone-acetaminophen (NORCO/VICODIN) 5-325 MG tablet Take 1 tablet by mouth every 4 (four) hours as needed for moderate pain. 30 tablet 0  . lamoTRIgine (LAMICTAL) 100 MG tablet TAKE 2 TABLETS (200 MG TOTAL) BY MOUTH 2  (TWO) TIMES DAILY. 120 tablet 0  . metFORMIN (GLUCOPHAGE) 500 MG tablet Take 1 tablet (500 mg total) by mouth 2 (two) times daily with a meal. 180 tablet 3  . methocarbamol (ROBAXIN) 500 MG tablet Take 1 tablet (500 mg total) by mouth every 8 (eight) hours as needed for muscle spasms. 8 tablet 0  . omeprazole (PRILOSEC) 20 MG capsule One pill twice daily- morning and before bedtime to reduce stomach acid 180 capsule 1  . tamsulosin (FLOMAX) 0.4 MG CAPS capsule Take 1 capsule (0.4 mg total) by mouth daily. 90 capsule 3  . VIMPAT 100 MG TABS TAKE 2 TABLETS IN MORNING AND 2 AT BEDTIME 60 tablet 1  . Accu-Chek Softclix Lancets lancets Use as instructed 100 each 12  . Blood Glucose Monitoring Suppl (TRUE METRIX METER) w/Device KIT 1 Units by Does not apply route daily. 1 kit 0  . glucose blood (ACCU-CHEK AVIVA PLUS) test strip Use as instructed 100 each 12  . Misc. Devices MISC Please provide BP cuff for patient 1 each 0  . ondansetron (ZOFRAN) 4 MG tablet Take 1 tablet (4 mg total) by mouth every 8 (eight) hours as needed for nausea or vomiting. 20 tablet 0   No current facility-administered medications on file prior to visit.     patient denies chest pain, shortness of breath, orthopnea. Denies lower extremity edema, abdominal pain, change in appetite, change in bowel movements. Patient denies rashes, musculoskeletal complaints. No other specific complaints in a complete review of systems.   BP 133/84   Pulse 71   Temp 98.3 F (36.8 C) (Oral)   Resp 16   Wt 193 lb (87.5 kg)   SpO2 95%   BMI 29.35 kg/m   well-developed well-nourished male in no acute distress. HEENT exam atraumatic, normocephalic, neck supple without jugular venous distention. Chest clear to auscultation cardiac exam S1-S2 are regular. Abdominal exam  with bowel sounds, soft and nontender. Extremities no edema. Neurologic exam is alert with a normal gait.   Prediabetes Lab Results  Component Value Date   HGBA1C 6.2 (A)  07/01/2020   Controlled, continue current meds  Essential hypertension bp is adequately controlled. contiunue same meds Discussed goal of less than 140/90   At the end of the visit the patient discussed a sense of difficulty swallowing and a throat irritation at night. I'll have him see Dr. Erik Obey next week.

## 2020-07-31 NOTE — Therapy (Signed)
[place], [place], [address] Phone: [phone]   Fax:  [phone]  Physical Therapy Treatment  Patient Details  Name: Joshua [name] MRN: [id number] Date of Birth: 1961-04-28 Referring Provider (PT): Suzan Slick, NP   Encounter Date: 07/31/2020   PT End of Session - 07/31/20 1111    Visit Number 4    Number of Visits 13    Date for PT Re-Evaluation 09/03/20    Authorization Type [place] MCD - submitted on 07/09/2020, approved 3 visits, requested to resubmit on 07/31/20    Authorization Time Period 07/21/20-08/10/20    Authorization - Visit Number 3    Authorization - Number of Visits 3    PT Start Time 1100    PT Stop Time 1145    PT Time Calculation (min) 45 min    Activity Tolerance Patient tolerated treatment well;Patient limited by pain    Behavior During Therapy [facility] for tasks assessed/performed           Past Medical History:  Diagnosis Date  . Anxiety   . Brain tumor ([place]) 02/20/2013   brain tumor removed in March 2014, Dr Donald Pore  . Diffuse idiopathic skeletal hyperostosis 06/05/2013  . Dizziness   . Enlarged prostate   . GERD (gastroesophageal reflux disease)   . Headache(784.0)    scattered  . Hypertension   . 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[place]) 03/07/2019  . Meningioma ([place])   . Meningioma, recurrent of brain ([place]) 01/31/2019  . Neuropathy 12/05/2019  . Rotator cuff tear    right  . Seizures ([place])    11/27/19 last sz 1 wk ago    Past Surgical History:  Procedure Laterality Date  . APPLICATION OF CRANIAL NAVIGATION N/A 01/10/2019   Procedure: APPLICATION OF CRANIAL NAVIGATION;  Surgeon: Erline Levine, MD;  Location: [place];  Service: Neurosurgery;  Laterality: N/A;  . CATARACT EXTRACTION Right   . CRANIOTOMY Right 11/06/2012   Procedure: CRANIOTOMY TUMOR EXCISION;  Surgeon: Erline Levine, MD;  Location: [place] [facility];  Service: Neurosurgery;  Laterality: Right;  Right  Parasagittal craniotomy for meningioma with Stealth  . CRANIOTOMY Right 01/10/2019   Procedure: Right Parasagittal Craniotomy for Tumor;  Surgeon: Erline Levine, MD;  Location: [place];  Service: Neurosurgery;  Laterality: Right;  Right parasagittal craniotomy for tumor  . ESOPHAGOGASTRODUODENOSCOPY    . right knee arthroscopy    . SHOULDER ARTHROSCOPY Right 06/09/2020   Procedure: RIGHT SHOULDER ARTHROSCOPY AND DEBRIDEMENT;  Surgeon: Newt Minion, MD;  Location: [place];  Service: Orthopedics;  Laterality: Right;    Vitals:   07/31/20 1149  BP: (!) 142/88     Subjective Assessment - 07/31/20 1103    Subjective I have shoulder pain with movement.    Currently in Pain? Yes    Pain Score 6     Pain Location Shoulder    Pain Orientation Right    Pain Descriptors / Indicators Aching;Dull    Aggravating Factors  sleeping, using the arm    Pain Relieving Factors meds somewhat              [facility] PT Assessment - 07/31/20 0001      AROM   Right Shoulder Flexion 115 Degrees   Improved from 78 degrees   Right Shoulder Internal Rotation --   reaches L1   Right Shoulder External Rotation --   reaches back of neck     PROM   Right  Shoulder Flexion 140 Degrees    Right Shoulder ABduction 125 Degrees    Right Shoulder Internal Rotation 70 Degrees    Right Shoulder External Rotation 50 Degrees                         [facility] Adult PT Treatment/Exercise - 07/31/20 0001      Shoulder Exercises: Supine   External Rotation Weight (lbs) cane x 15    External Rotation Limitations also isometric hold with yellow band    Flexion Limitations chest press x 10 with wooden dowel AAROM      Shoulder Exercises: Standing   Other Standing Exercises UE ranger step and reach x 10 flexion, wall slides too painful , wall ladder x 5 (140 degrees AAROM flexion)      Shoulder Exercises: Pulleys   Flexion 3 minutes    Scaption 3 minutes      Shoulder Exercises:  ROM/Strengthening   UBE (Upper Arm Bike) L1 2 minutes each way      Shoulder Exercises: Isometric Strengthening   Flexion Limitations x 10 , standing    Other Isometric Exercises manual isometric ER supine 5 sec x 10      Manual Therapy   Passive ROM ER IR , flexion and abduction to tolerance                    PT Short Term Goals - 07/31/20 1115      PT SHORT TERM GOAL #1   Title pt to be IND with inital HEP    Baseline he reports compliance with HEP    Time 3    Period Weeks    Status Achieved    Target Date 07/30/20      PT SHORT TERM GOAL #2   Title pt to verbalize techniques to reduce R shoulder pain via RICE and HEP    Baseline Pt verbalizes use of HEP and rest for pain reduction    Time 3    Period Weeks    Status Achieved      PT SHORT TERM GOAL #3   Title pt to increaes R shoulder PROM/ AAROM flexion by >/= 15 degrees to promote functional mobility    Time 3    Period Weeks    Status Achieved             PT Long Term Goals - 07/31/20 1139      PT LONG TERM GOAL #1   Title increase R shoulder AROM flexion/ abduction to >/= 120 degrees and IR/ER with functional limits compard bil with </= 2/10 max pain    Baseline AROM improving    Time 6    Period Weeks    Status On-going      PT LONG TERM GOAL #2   Title pt to increase R shoulder strength to >/= 4/5 to promote stability and  functional lifting/ carrying activities    Baseline unable to test strength due to irritabiity at evaluation    Time 6    Period Weeks    Status On-going      PT LONG TERM GOAL #3   Title pt to be able to lift/ lower >/= 8# to and from and overhead shelf carry >/= 10# in RUE with </= 2/10 max pain for functional strength    Baseline Pt reports his HEP has been going well.    Time 6    Period Weeks  Status On-going      PT LONG TERM GOAL #4   Title pt to be IND with all HEP given and is able to maintain and progress her current LOF IND.    Baseline progressing  HEP    Time 6    Period Weeks    Status On-going                 Plan - 07/31/20 1111    Clinical Impression Statement Pt demonstrates improved overhead reach. He is still limited by pain with all shoulder movements. His PROM has improved. See objective measures.  His AROM has improved. All STGs met. He would benefit from additional physical therapy to decrease R shoulder pain, improve ROM/ Strength, and maximize his function by addressing the deficits listed.    Comorbidities hx of CX, anxiety, seizures    Examination-Activity Limitations Stand;Lift;Locomotion Level;Carry;Dressing;Hygiene/Grooming    Rehab Potential Good    PT Frequency 2x / week    PT Duration 6 weeks    PT Treatment/Interventions ADLs/Self Care Home Management;Cryotherapy;Gait training;Stair training;Therapeutic activities;Therapeutic exercise;Neuromuscular re-education;Patient/family education;Manual techniques;Dry needling;Taping;Vasopneumatic Device    PT Next Visit Plan check status of CCME;  review/ update HEP add bands. PRn, Shoulde rmobs/ PROM, continued working on [employer], scapular setting, modalities for pain (no heat/ E-stim hx of CA)    PT Home Exercise Plan [id number] - pendulums, Shoulder wand flexion/ abduction, scapular retraction, upper trap stretch, table slides    Consulted and Agree with Plan of Care Patient           Patient will benefit from skilled therapeutic intervention in order to improve the following deficits and impairments:  Improper body mechanics,Increased muscle spasms,Decreased strength,Postural dysfunction,Pain,Decreased activity tolerance,Decreased endurance,Increased edema  Visit Diagnosis: Chronic right shoulder pain  Muscle weakness (generalized)  Localized edema     Problem List Patient Active Problem List   Diagnosis Date Noted  . Essential hypertension 07/02/2020  . Prediabetes 07/02/2020  . Polyuria 07/02/2020  . Nontraumatic complete tear of right rotator cuff    . Impingement syndrome of right shoulder   . Neuropathy 12/05/2019  . 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[place]) 03/07/2019  . Seizures ([place]) 01/31/2019  . Meningioma, recurrent of brain ([place]) 01/31/2019  . Meningioma ([place]) 01/03/2019  . Brain tumor ([place]) 12/27/2018  . Diffuse idiopathic skeletal hyperostosis 06/05/2013    Dorene Ar, PTA 07/31/2020, 12:07 PM  [facility] [address] [place], [place], [address] Phone: [phone]   Fax:  [phone]  Name: Joshua [name] MRN: [id number] Date of Birth: [birth year]

## 2020-07-31 NOTE — Assessment & Plan Note (Signed)
bp is adequately controlled. contiunue same meds Discussed goal of less than 140/90

## 2020-07-31 NOTE — Addendum Note (Signed)
Addended by: Lisabeth Pick on: 07/31/2020 02:08 PM   Modules accepted: Orders

## 2020-08-03 ENCOUNTER — Ambulatory Visit: Payer: Self-pay | Admitting: Urology

## 2020-08-04 ENCOUNTER — Ambulatory Visit: Payer: Medicaid Other | Admitting: Physical Therapy

## 2020-08-04 ENCOUNTER — Other Ambulatory Visit: Payer: Self-pay

## 2020-08-04 ENCOUNTER — Telehealth: Payer: Self-pay | Admitting: Family Medicine

## 2020-08-04 ENCOUNTER — Telehealth: Payer: Self-pay

## 2020-08-04 ENCOUNTER — Encounter: Payer: Self-pay | Admitting: Physical Therapy

## 2020-08-04 DIAGNOSIS — R2689 Other abnormalities of gait and mobility: Secondary | ICD-10-CM | POA: Diagnosis not present

## 2020-08-04 DIAGNOSIS — R600 Localized edema: Secondary | ICD-10-CM

## 2020-08-04 DIAGNOSIS — M25511 Pain in right shoulder: Secondary | ICD-10-CM | POA: Diagnosis not present

## 2020-08-04 DIAGNOSIS — M25551 Pain in right hip: Secondary | ICD-10-CM | POA: Diagnosis not present

## 2020-08-04 DIAGNOSIS — G8929 Other chronic pain: Secondary | ICD-10-CM

## 2020-08-04 DIAGNOSIS — M25651 Stiffness of right hip, not elsewhere classified: Secondary | ICD-10-CM | POA: Diagnosis not present

## 2020-08-04 DIAGNOSIS — M6281 Muscle weakness (generalized): Secondary | ICD-10-CM

## 2020-08-04 NOTE — Telephone Encounter (Signed)
Per my note on 12/29 I told him the last would be his final refill. If still painful needs to come in and see dr Sharol Given

## 2020-08-04 NOTE — Telephone Encounter (Signed)
Pt treated for shoulder impingement 07/15/20 requesting refill on pain medication.

## 2020-08-04 NOTE — Telephone Encounter (Signed)
I called pt and appt sch for tomorrow with PA

## 2020-08-04 NOTE — Telephone Encounter (Signed)
Pt came into the office requesting a refill on his pain medication hydrocodone.

## 2020-08-04 NOTE — Telephone Encounter (Signed)
Copied from [place] [id number]. Topic: Referral - Request for Referral >> Aug 04, 2020 11:18 AM Joshua [name] wrote: Has patient seen PCP for this complaint? No - has been seen by Dr. Matilde [name] *If NO, is insurance requiring patient see PCP for this issue before PCP can refer them? Problems swallowing for about 2 months or longer Referral for which specialty: ENT Preferred provider/office: no preference - just someone on staff at [facility] since he has Medicaid per Dr. Matilde [name] Reason for referral: problems swallowing for 2 months.

## 2020-08-04 NOTE — Therapy (Signed)
[place], [place], [address] Phone: [phone]   Fax:  [phone]  Physical Therapy Treatment  Patient Details  Name: Joshua [name] MRN: [id number] Date of Birth: Mar 04, 1961 Referring Provider (PT): Suzan Slick, NP   Encounter Date: 08/04/2020   PT End of Session - 08/04/20 0939    Visit Number 5    Number of Visits 13    Date for PT Re-Evaluation 09/03/20    Authorization Type [place] MCD - submitted on 07/09/2020, approved 3 visits, requested to resubmit on 07/31/20    PT Start Time 0932    PT Stop Time 1035    PT Time Calculation (min) 63 min    Activity Tolerance Patient tolerated treatment well;Patient limited by pain    Behavior During Therapy [facility] for tasks assessed/performed           Past Medical History:  Diagnosis Date  . Anxiety   . Brain tumor ([place]) 02/20/2013   brain tumor removed in March 2014, Dr Donald Pore  . Diffuse idiopathic skeletal hyperostosis 06/05/2013  . Dizziness   . Enlarged prostate   . GERD (gastroesophageal reflux disease)   . Headache(784.0)    scattered  . Hypertension   . 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[place]) 03/07/2019  . Meningioma ([place])   . Meningioma, recurrent of brain ([place]) 01/31/2019  . Neuropathy 12/05/2019  . Rotator cuff tear    right  . Seizures ([place])    11/27/19 last sz 1 wk ago    Past Surgical History:  Procedure Laterality Date  . APPLICATION OF CRANIAL NAVIGATION N/A 01/10/2019   Procedure: APPLICATION OF CRANIAL NAVIGATION;  Surgeon: Erline Levine, MD;  Location: [place];  Service: Neurosurgery;  Laterality: N/A;  . CATARACT EXTRACTION Right   . CRANIOTOMY Right 11/06/2012   Procedure: CRANIOTOMY TUMOR EXCISION;  Surgeon: Erline Levine, MD;  Location: [place] NEUR[facility];  Service: Neurosurgery;  Laterality: Right;  Right Parasagittal craniotomy for meningioma with Stealth  . CRANIOTOMY Right 01/10/2019   Procedure: Right Parasagittal  Craniotomy for Tumor;  Surgeon: Erline Levine, MD;  Location: [place];  Service: Neurosurgery;  Laterality: Right;  Right parasagittal craniotomy for tumor  . ESOPHAGOGASTRODUODENOSCOPY    . right knee arthroscopy    . SHOULDER ARTHROSCOPY Right 06/09/2020   Procedure: RIGHT SHOULDER ARTHROSCOPY AND DEBRIDEMENT;  Surgeon: Newt Minion, MD;  Location: [place];  Service: Orthopedics;  Laterality: Right;    There were no vitals filed for this visit.   Subjective Assessment - 08/04/20 0936    Subjective Pain up today, mostly bothersome at night.  It has its moments during the daytime hours.  I could not sleep much last night.  I may have to talk to the doctor about something else for pain .    Currently in Pain? Yes    Pain Score 8     Pain Location Shoulder    Pain Orientation Right    Pain Descriptors / Indicators Aching;Nagging    Pain Type Acute pain;Surgical pain    Pain Onset More than a month ago    Pain Frequency Intermittent    Aggravating Factors  sleeping, PM, using the arm    Pain Relieving Factors meds, rest, would like to get a heating pad tonight    Multiple Pain Sites No              [facility] Adult PT Treatment/Exercise - 08/04/20 0001  Shoulder Exercises: Supine   Other Supine Exercises cane: chest press, overhead   x 15 each     Shoulder Exercises: Standing   Flexion Strengthening;Right;10 reps    Theraband Level (Shoulder Flexion) Level 1 (Yellow)    Shoulder Flexion Weight (lbs) punch < 45 deg    Extension Strengthening;Both;15 reps    Extension Weight (lbs) ball press    Row Strengthening;Both;20 reps;Theraband    Theraband Level (Shoulder Row) Level 2 (Red)    Row Limitations + HEP      Shoulder Exercises: Pulleys   Flexion 3 minutes      Shoulder Exercises: Therapy Ball   Flexion Both;15 reps    ABduction Right;15 reps    Scaption Right;15 reps      Shoulder Exercises: ROM/Strengthening   Ranger seated flexion, circles with  cues for scapular position   x 3 min total     Manual Therapy   Manual Therapy Joint mobilization;Soft tissue mobilization;Passive ROM    Joint Mobilization Gr II-III inferior glide    Soft tissue mobilization Rt pecs, supraspinatus    Passive ROM abduction, flexion, ER                  PT Education - 08/04/20 1050    Education Details band exercises, POC    Person(s) Educated Patient    Methods Explanation;Handout;Demonstration    Comprehension Verbalized understanding;Returned demonstration            PT Short Term Goals - 07/31/20 1115      PT SHORT TERM GOAL #1   Title pt to be IND with inital HEP    Baseline he reports compliance with HEP    Time 3    Period Weeks    Status Achieved    Target Date 07/30/20      PT SHORT TERM GOAL #2   Title pt to verbalize techniques to reduce R shoulder pain via RICE and HEP    Baseline Pt verbalizes use of HEP and rest for pain reduction    Time 3    Period Weeks    Status Achieved      PT SHORT TERM GOAL #3   Title pt to increaes R shoulder PROM/ AAROM flexion by >/= 15 degrees to promote functional mobility    Time 3    Period Weeks    Status Achieved             PT Long Term Goals - 07/31/20 1139      PT LONG TERM GOAL #1   Title increase R shoulder AROM flexion/ abduction to >/= 120 degrees and IR/ER with functional limits compard bil with </= 2/10 max pain    Baseline AROM improving    Time 6    Period Weeks    Status On-going      PT LONG TERM GOAL #2   Title pt to increase R shoulder strength to >/= 4/5 to promote stability and  functional lifting/ carrying activities    Baseline unable to test strength due to irritabiity at evaluation    Time 6    Period Weeks    Status On-going      PT LONG TERM GOAL #3   Title pt to be able to lift/ lower >/= 8# to and from and overhead shelf carry >/= 10# in RUE with </= 2/10 max pain for functional strength    Baseline Pt reports his HEP has been going well.     Time  6    Period Weeks    Status On-going      PT LONG TERM GOAL #4   Title pt to be IND with all HEP given and is able to maintain and progress her current LOF IND.    Baseline progressing HEP    Time 6    Period Weeks    Status On-going                 Plan - 08/04/20 1275    Clinical Impression Statement Pt with increased pain today, limitations in Rt shoulder function including tightness in Rt elbow.  He has trouble sleeping. Patient will benefit from PT to address decreased AROM, strength, functional use of Rt UE for ADLs and improved quality of life.    Personal Factors and Comorbidities Comorbidity 3+    Comorbidities hx of CX, anxiety, seizures    Examination-Activity Limitations Stand;Lift;Locomotion Level;Carry;Dressing;Hygiene/Grooming    Examination-Participation Restrictions Community Activity;Interpersonal Relationship    Stability/Clinical Decision Making Evolving/Moderate complexity    Clinical Decision Making Moderate    Rehab Potential Good    PT Frequency 2x / week    PT Treatment/Interventions ADLs/Self Care Home Management;Cryotherapy;Gait training;Stair training;Therapeutic activities;Therapeutic exercise;Neuromuscular re-education;Patient/family education;Manual techniques;Dry needling;Taping;Vasopneumatic Device    PT Next Visit Plan check status of CCME;  review/ update HEP add bands. Shoulder mobs/ PROM, continued working on [employer], scapular setting, heat OK (Hx of meningioma)    PT Home Exercise Plan [email or web address] - pendulums, Shoulder wand flexion/ abduction, scapular retraction, upper trap stretch, table slides, ER and Row bANDS    Consulted and Agree with Plan of Care Patient           Patient will benefit from skilled therapeutic intervention in order to improve the following deficits and impairments:  Improper body mechanics,Increased muscle spasms,Decreased strength,Postural dysfunction,Pain,Decreased activity tolerance,Decreased  endurance,Increased edema  Visit Diagnosis: Chronic right shoulder pain  Muscle weakness (generalized)  Localized edema     Problem List Patient Active Problem List   Diagnosis Date Noted  . Essential hypertension 07/02/2020  . Prediabetes 07/02/2020  . Polyuria 07/02/2020  . Nontraumatic complete tear of right rotator cuff   . Impingement syndrome of right shoulder   . Neuropathy 12/05/2019  . 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[place]) 03/07/2019  . Seizures ([place]) 01/31/2019  . Meningioma, recurrent of brain ([place]) 01/31/2019  . Meningioma ([place]) 01/03/2019  . Diffuse idiopathic skeletal hyperostosis 06/05/2013    Joshua [name] 08/04/2020, 10:56 AM  [place], [place], [address] Phone: [phone]   Fax:  [phone]  Name: Joshua [name] MRN: [id number] Date of Birth: 29-Jun-1961  Raeford Razor, PT 08/04/20 10:56 AM Phone: [phone] Fax: [phone]

## 2020-08-05 ENCOUNTER — Ambulatory Visit (INDEPENDENT_AMBULATORY_CARE_PROVIDER_SITE_OTHER): Payer: Medicaid Other | Admitting: Physician Assistant

## 2020-08-05 ENCOUNTER — Ambulatory Visit: Payer: Medicaid Other

## 2020-08-05 ENCOUNTER — Encounter: Payer: Self-pay | Admitting: Physician Assistant

## 2020-08-05 ENCOUNTER — Other Ambulatory Visit: Payer: Self-pay

## 2020-08-05 DIAGNOSIS — M7541 Impingement syndrome of right shoulder: Secondary | ICD-10-CM

## 2020-08-05 DIAGNOSIS — G8929 Other chronic pain: Secondary | ICD-10-CM

## 2020-08-05 DIAGNOSIS — M6281 Muscle weakness (generalized): Secondary | ICD-10-CM

## 2020-08-05 MED ORDER — HYDROCODONE-ACETAMINOPHEN 5-325 MG PO TABS: 1.0000 | ORAL_TABLET | Freq: Four times a day (QID) | ORAL | 0 refills | Status: DC | PRN

## 2020-08-05 NOTE — Telephone Encounter (Signed)
Please place on Dr.Wolicki schedule next available. Please inform patient.

## 2020-08-05 NOTE — Telephone Encounter (Signed)
Yes

## 2020-08-05 NOTE — Telephone Encounter (Signed)
Can pt be placed on Dr.Wolicki schedule?

## 2020-08-05 NOTE — Progress Notes (Signed)
Office Visit Note   Patient: Joshua [name]           Date of Birth: Oct 20, 1960           MRN: [id number] Visit Date: 08/05/2020              Requested by: Charlott Rakes, MD [place],  [place] [address] PCP: Charlott Rakes, MD  No chief complaint on file.     HPI: Patient is a pleasant 60 year old gentleman who is status post right shoulder arthroscopy with debridement.  At his last visit he was given a refill of hydrocodone.  He is requesting one as he has begun physical therapy and is having pain especially at night.  He is trying to alternate this with Tylenol.  Assessment & Plan: Visit Diagnoses: No diagnosis found.  Plan: Patient understands and clearly states that this will be his last refill.  If he gets no further relief after completion of physical therapy he understands he would either do chronic pain management.  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   Ortho Exam  Patient is alert, oriented, no adenopathy, well-dressed, normal affect, normal respiratory effort. Right shoulder.  Well-healed surgical portals he has fairly good resisted abductor strength.  He has internal rotation to just above his belt line.    Imaging: No results found. No images are attached to the encounter.  Labs: Lab Results  Component Value Date   HGBA1C 6.2 (A) 07/01/2020   HGBA1C 6.3 (H) 04/15/2020   HGBA1C 6.2 (H) 12/03/2019   REPTSTATUS 02/28/2020 FINAL 02/27/2020   CULT MULTIPLE SPECIES PRESENT, SUGGEST RECOLLECTION (A) 02/27/2020     Lab Results  Component Value Date   ALBUMIN 4.2 04/25/2020   ALBUMIN 4.7 04/15/2020   ALBUMIN 4.3 03/10/2020    Lab Results  Component Value Date   MG 1.8 07/09/2019   No results found for:  VD25OH  No results found for: PREALBUMIN CBC EXTENDED Latest Ref Rng & Units 07/18/2020 06/21/2020 06/11/2020  WBC 4.0 - 10.5 K/uL 8.4 8.7 8.8  RBC 4.22 - 5.81 MIL/uL 4.86 4.97 4.71  HGB 13.0 - 17.0 g/dL 15.1 15.6 14.6  HCT 39.0 - 52.0 % 44.3 45.8 43.1  PLT 150 - 400 K/uL 274 295 268  NEUTROABS 1.7 - 7.7 K/uL - - -  LYMPHSABS 0.7 - 4.0 K/uL - - -     There is no height or weight on file to calculate BMI.  Orders:  No orders of the defined types were placed in this encounter.  No orders of the defined types were placed in this encounter.    Procedures: No procedures performed  Clinical Data: No additional findings.  ROS:  All other systems negative, except as noted in the HPI. Review of Systems  Objective: Vital Signs: There were no vitals taken for this visit.  Specialty Comments:  No specialty comments available.  PMFS History: Patient Active Problem List   Diagnosis Date Noted  . Essential hypertension 07/02/2020  . Prediabetes 07/02/2020  . Polyuria 07/02/2020  . Nontraumatic complete tear of right rotator cuff   . Impingement syndrome of right shoulder   . Neuropathy 12/05/2019  . 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[place]) 03/07/2019  . Seizures ([place]) 01/31/2019  .  Meningioma, recurrent of brain ([place]) 01/31/2019  . Meningioma ([place]) 01/03/2019  . Diffuse idiopathic skeletal hyperostosis 06/05/2013   Past Medical History:  Diagnosis Date  . Anxiety   . Brain tumor ([place]) 02/20/2013   brain tumor removed in March 2014, Dr Donald Pore  . Diffuse idiopathic skeletal hyperostosis 06/05/2013  . Dizziness   . Enlarged prostate   . GERD (gastroesophageal reflux disease)   . Headache(784.0)    scattered  . Hypertension   . 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[place]) 03/07/2019  . Meningioma ([place])   . Meningioma, recurrent of brain ([place]) 01/31/2019  . Neuropathy 12/05/2019  . Rotator cuff tear    right  . Seizures ([place])    11/27/19 last sz 1 wk ago     Family History  Problem Relation Age of Onset  . Hypertension Mother   . Dementia Mother   . Diabetes Mother   . Hypertension Father   . CAD Father   . Diabetes Father   . CAD Brother     Past Surgical History:  Procedure Laterality Date  . APPLICATION OF CRANIAL NAVIGATION N/A 01/10/2019   Procedure: APPLICATION OF CRANIAL NAVIGATION;  Surgeon: Erline Levine, MD;  Location: [place];  Service: Neurosurgery;  Laterality: N/A;  . CATARACT EXTRACTION Right   . CRANIOTOMY Right 11/06/2012   Procedure: CRANIOTOMY TUMOR EXCISION;  Surgeon: Erline Levine, MD;  Location: [place] NEUR[facility]S;  Service: Neurosurgery;  Laterality: Right;  Right Parasagittal craniotomy for meningioma with Stealth  . CRANIOTOMY Right 01/10/2019   Procedure: Right Parasagittal Craniotomy for Tumor;  Surgeon: Erline Levine, MD;  Location: [place];  Service: Neurosurgery;  Laterality: Right;  Right parasagittal craniotomy for tumor  . ESOPHAGOGASTRODUODENOSCOPY    . right knee arthroscopy    . SHOULDER ARTHROSCOPY Right 06/09/2020   Procedure: RIGHT SHOULDER ARTHROSCOPY AND DEBRIDEMENT;  Surgeon: Newt Minion, MD;  Location: [place];  Service: Orthopedics;  Laterality: Right;   Social History   Occupational History  . Not on file  Tobacco Use  . Smoking status: Current Some Day Smoker    Types: Cigarettes    Last attempt to quit: 01/29/2019    Years since quitting: 1.5  . Smokeless tobacco: Never Used  . Tobacco comment: weekend smoker  Vaping Use  . Vaping Use: Never used  Substance and Sexual Activity  . Alcohol use: Yes    Comment: social  . Drug use: No  . Sexual activity: Not Currently

## 2020-08-05 NOTE — Therapy (Signed)
[place], [place], [address] Phone: [phone]   Fax:  [phone]  Physical Therapy Treatment  Patient Details  Name: Joshua [name] MRN: [id number] Date of Birth: [birth year] Referring Provider (PT): Suzan Slick, NP   Encounter Date: 08/05/2020   PT End of Session - 08/05/20 1054    Visit Number 6    Number of Visits 13    Date for PT Re-Evaluation 09/03/20    Authorization Type [place] [employer] - submitted on 07/09/2020, approved 3 visits, requested to resubmit on 07/31/20    PT Start Time 1101    PT Stop Time 1155    PT Time Calculation (min) 54 min    Activity Tolerance Patient tolerated treatment well;Patient limited by pain    Behavior During Therapy [facility] for tasks assessed/performed           Past Medical History:  Diagnosis Date  . Anxiety   . Brain tumor ([place]) 02/20/2013   brain tumor removed in March 2014, Dr Donald Pore  . Diffuse idiopathic skeletal hyperostosis 06/05/2013  . Dizziness   . Enlarged prostate   . GERD (gastroesophageal reflux disease)   . Headache(784.0)    scattered  . Hypertension   . 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[place]) 03/07/2019  . Meningioma ([place])   . Meningioma, recurrent of brain ([place]) 01/31/2019  . Neuropathy 12/05/2019  . Rotator cuff tear    right  . Seizures ([place])    11/27/19 last sz 1 wk ago    Past Surgical History:  Procedure Laterality Date  . APPLICATION OF CRANIAL NAVIGATION N/A 01/10/2019   Procedure: APPLICATION OF CRANIAL NAVIGATION;  Surgeon: Erline Levine, MD;  Location: [place];  Service: Neurosurgery;  Laterality: N/A;  . CATARACT EXTRACTION Right   . CRANIOTOMY Right 11/06/2012   Procedure: CRANIOTOMY TUMOR EXCISION;  Surgeon: Erline Levine, MD;  Location: [place] [facility];  Service: Neurosurgery;  Laterality: Right;  Right Parasagittal craniotomy for meningioma with Stealth  . CRANIOTOMY Right 01/10/2019   Procedure: Right Parasagittal  Craniotomy for Tumor;  Surgeon: Erline Levine, MD;  Location: [place];  Service: Neurosurgery;  Laterality: Right;  Right parasagittal craniotomy for tumor  . ESOPHAGOGASTRODUODENOSCOPY    . right knee arthroscopy    . SHOULDER ARTHROSCOPY Right 06/09/2020   Procedure: RIGHT SHOULDER ARTHROSCOPY AND DEBRIDEMENT;  Surgeon: Newt Minion, MD;  Location: [place];  Service: Orthopedics;  Laterality: Right;    There were no vitals filed for this visit.   Subjective Assessment - 08/05/20 1103    Subjective Patient had f/u with physician today and will potentially be referred to pain management if his pain remains elevated. He plans to f/u with phsyician in 1 month and plans to continue PT until this appointment.    Currently in Pain? Yes    Pain Score 8     Pain Location Shoulder    Pain Orientation Right    Pain Descriptors / Indicators Aching    Pain Type Acute pain;Surgical pain    Pain Onset More than a month ago                             [facility] Adult PT Treatment/Exercise - 08/05/20 0001      Shoulder Exercises: Supine   Flexion Limitations AAROM with dowel 1 x 10    Other Supine Exercises dowel: chest press, overhead   x 15  each     Shoulder Exercises: Sidelying   External Rotation Limitations 2 x 10    Flexion Limitations 1 x 5 partial range      Shoulder Exercises: ROM/Strengthening   UBE (Upper Arm Bike) L1.5 3 min each fwd/bwd    Other ROM/Strengthening Exercises finger ladder flexion 1 x 5      Modalities   Modalities Moist Heat      Moist Heat Therapy   Number Minutes Moist Heat 10 Minutes    Moist Heat Location Shoulder      Manual Therapy   Joint Mobilization Gr II-III inferior and A/P glide    Passive ROM abduction, flexion, ER                  PT Education - 08/05/20 1214    Education Details importance of continuing with ROM exercises throughout the day.    Person(s) Educated Patient    Methods Explanation     Comprehension Verbalized understanding            PT Short Term Goals - 07/31/20 1115      PT SHORT TERM GOAL #1   Title pt to be IND with inital HEP    Baseline he reports compliance with HEP    Time 3    Period Weeks    Status Achieved    Target Date 07/30/20      PT SHORT TERM GOAL #2   Title pt to verbalize techniques to reduce R shoulder pain via RICE and HEP    Baseline Pt verbalizes use of HEP and rest for pain reduction    Time 3    Period Weeks    Status Achieved      PT SHORT TERM GOAL #3   Title pt to increaes R shoulder PROM/ AAROM flexion by >/= 15 degrees to promote functional mobility    Time 3    Period Weeks    Status Achieved             PT Long Term Goals - 07/31/20 1139      PT LONG TERM GOAL #1   Title increase R shoulder AROM flexion/ abduction to >/= 120 degrees and IR/ER with functional limits compard bil with </= 2/10 max pain    Baseline AROM improving    Time 6    Period Weeks    Status On-going      PT LONG TERM GOAL #2   Title pt to increase R shoulder strength to >/= 4/5 to promote stability and  functional lifting/ carrying activities    Baseline unable to test strength due to irritabiity at evaluation    Time 6    Period Weeks    Status On-going      PT LONG TERM GOAL #3   Title pt to be able to lift/ lower >/= 8# to and from and overhead shelf carry >/= 10# in RUE with </= 2/10 max pain for functional strength    Baseline Pt reports his HEP has been going well.    Time 6    Period Weeks    Status On-going      PT LONG TERM GOAL #4   Title pt to be IND with all HEP given and is able to maintain and progress her current LOF IND.    Baseline progressing HEP    Time 6    Period Weeks    Status On-going  Plan - 08/05/20 1118    Clinical Impression Statement Though patient reports high pain levels at beginning of session, he tolerated session well with light progression of AAROM and AROM activity.  Patient reported a reduction in shoulder pain at the end of session rated as 5/10 and reported feeling "looser." Patient educated on importance of frequent movement of the shoulder throughout the day to assist in decreasing his overall stiffness and pain.    Personal Factors and Comorbidities Comorbidity 3+    Comorbidities hx of CX, anxiety, seizures    Examination-Activity Limitations Stand;Lift;Locomotion Level;Carry;Dressing;Hygiene/Grooming    Examination-Participation Restrictions Community Activity;Interpersonal Relationship    Stability/Clinical Decision Making Evolving/Moderate complexity    Rehab Potential Good    PT Frequency 2x / week    PT Treatment/Interventions ADLs/Self Care Home Management;Cryotherapy;Gait training;Stair training;Therapeutic activities;Therapeutic exercise;Neuromuscular re-education;Patient/family education;Manual techniques;Dry needling;Taping;Vasopneumatic Device    PT Next Visit Plan check status of CCME;  update HEP. Continue shoulder PROM/AAROM. progress shoulder strengthening as tolerated. scapular setting, heat OK (Hx of meningioma)    PT Home Exercise Plan [id number] - pendulums, Shoulder wand flexion/ abduction, scapular retraction, upper trap stretch, table slides, ER and Row bANDS    Consulted and Agree with Plan of Care Patient           Patient will benefit from skilled therapeutic intervention in order to improve the following deficits and impairments:  Improper body mechanics,Increased muscle spasms,Decreased strength,Postural dysfunction,Pain,Decreased activity tolerance,Decreased endurance,Increased edema  Visit Diagnosis: Chronic right shoulder pain  Muscle weakness (generalized)     Problem List Patient Active Problem List   Diagnosis Date Noted  . Essential hypertension 07/02/2020  . Prediabetes 07/02/2020  . Polyuria 07/02/2020  . Nontraumatic complete tear of right rotator cuff   . Impingement syndrome of right shoulder   .  Neuropathy 12/05/2019  . 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[place]) 03/07/2019  . Seizures ([place]) 01/31/2019  . Meningioma, recurrent of brain ([place]) 01/31/2019  . Meningioma ([place]) 01/03/2019  . Diffuse idiopathic skeletal hyperostosis 06/05/2013   Gwendolyn Grant, PT, DPT, ATC 08/05/20 12:19 PM  [facility] [address] [place], [place], [address] Phone: [phone]   Fax:  [phone]  Name: Joshua [name] MRN: [id number] Date of Birth: [birth year]

## 2020-08-06 ENCOUNTER — Ambulatory Visit: Payer: Medicaid Other | Admitting: Physical Therapy

## 2020-08-11 ENCOUNTER — Other Ambulatory Visit: Payer: Self-pay

## 2020-08-11 ENCOUNTER — Ambulatory Visit: Payer: Medicaid Other | Admitting: Physical Therapy

## 2020-08-11 ENCOUNTER — Encounter: Payer: Self-pay | Admitting: Physical Therapy

## 2020-08-11 DIAGNOSIS — M25511 Pain in right shoulder: Secondary | ICD-10-CM | POA: Diagnosis not present

## 2020-08-11 DIAGNOSIS — M25651 Stiffness of right hip, not elsewhere classified: Secondary | ICD-10-CM

## 2020-08-11 DIAGNOSIS — R2689 Other abnormalities of gait and mobility: Secondary | ICD-10-CM | POA: Diagnosis not present

## 2020-08-11 DIAGNOSIS — M25551 Pain in right hip: Secondary | ICD-10-CM | POA: Diagnosis not present

## 2020-08-11 DIAGNOSIS — G8929 Other chronic pain: Secondary | ICD-10-CM | POA: Diagnosis not present

## 2020-08-11 DIAGNOSIS — R600 Localized edema: Secondary | ICD-10-CM

## 2020-08-11 DIAGNOSIS — M6281 Muscle weakness (generalized): Secondary | ICD-10-CM | POA: Diagnosis not present

## 2020-08-11 NOTE — Therapy (Signed)
[place], [place], [address] Phone: [phone]   Fax:  [phone]  Physical Therapy Treatment  Patient Details  Name: Joshua [name] MRN: [id number] Date of Birth: 04/18/1961 Referring Provider (PT): Suzan Slick, NP   Encounter Date: 08/11/2020   PT End of Session - 08/11/20 1017    Visit Number 7    Number of Visits 13    Date for PT Re-Evaluation 09/03/20    Authorization Type [place] MCD - submitted on 07/09/2020, approved 3 visits, requested to resubmit on 07/31/20    Authorization Time Period 08/11/2020 - 09/21/2020    Authorization - Visit Number 1    Authorization - Number of Visits 12    PT Start Time 1018    PT Stop Time 1100    PT Time Calculation (min) 42 min           Past Medical History:  Diagnosis Date  . Anxiety   . Brain tumor ([place]) 02/20/2013   brain tumor removed in March 2014, Dr Donald Pore  . Diffuse idiopathic skeletal hyperostosis 06/05/2013  . Dizziness   . Enlarged prostate   . GERD (gastroesophageal reflux disease)   . Headache(784.0)    scattered  . Hypertension   . 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 Meningioma ([place])   . Meningioma, recurrent of brain ([place]) 01/31/2019  . Neuropathy 12/05/2019  . Rotator cuff tear    right  . Seizures ([place])    11/27/19 last sz 1 wk ago    Past Surgical History:  Procedure Laterality Date  . APPLICATION OF CRANIAL NAVIGATION N/A 01/10/2019   Procedure: APPLICATION OF CRANIAL NAVIGATION;  Surgeon: Erline Levine, MD;  Location: [place];  Service: Neurosurgery;  Laterality: N/A;  . CATARACT EXTRACTION Right   . CRANIOTOMY Right 11/06/2012   Procedure: CRANIOTOMY TUMOR EXCISION;  Surgeon: Erline Levine, MD;  Location: [place] [facility];  Service: Neurosurgery;  Laterality: Right;  Right Parasagittal craniotomy for meningioma with Stealth  . CRANIOTOMY Right 01/10/2019   Procedure: Right Parasagittal Craniotomy for Tumor;   Surgeon: Erline Levine, MD;  Location: [place];  Service: Neurosurgery;  Laterality: Right;  Right parasagittal craniotomy for tumor  . ESOPHAGOGASTRODUODENOSCOPY    . right knee arthroscopy    . SHOULDER ARTHROSCOPY Right 06/09/2020   Procedure: RIGHT SHOULDER ARTHROSCOPY AND DEBRIDEMENT;  Surgeon: Newt Minion, MD;  Location: [place];  Service: Orthopedics;  Laterality: Right;    There were no vitals filed for this visit.   Subjective Assessment - 08/11/20 1018    Subjective "with the exercises I am doing at home, the shoulder is loosening up. When I come to PT it is stretching and it helps."    Diagnostic tests CT 10/15   IMPRESSION:  Complete supraspinatus and infraspinatus tendon tears with  retraction to the glenohumeral joint. Mild to moderate atrophy is  worse in the infraspinatus. Edema in the muscle bellies secondary to  atrophy is also worse in the infraspinatus.     Severe long head of biceps tendinopathy. The intra-articular segment  is markedly attenuated consistent with partial tear.     Subscapularis tendinopathy without tear.     Mild acromioclavicular and glenohumeral osteoarthritis.     Subacromial/subdeltoid bursitis.     No acute bony abnormality.    Patient Stated Goals to decrease pain.    Currently in Pain? Yes    Pain Score 5  Pain Orientation Right    Pain Descriptors / Indicators Aching    Pain Type Chronic pain              [facility] PT Assessment - 08/11/20 0001      Assessment   Medical Diagnosis Impingement syndrome of right shoulder (M75.41)    Referring Provider (PT) Suzan Slick, NP                         [facility] Adult PT Treatment/Exercise - 08/11/20 0001      Shoulder Exercises: Seated   Internal Rotation 10 reps;Strengthening;Right;Theraband    Theraband Level (Shoulder Internal Rotation) Level 2 (Red)    Other Seated Exercises money 2 x 10 with red band      Shoulder Exercises: Sidelying   Other Sidelying  Exercises 3 x 10 shoulder abduction   2nd and 3rd set with manual scapular assist. Utilized coutning backward by 5 and pt noted decreased pain     Shoulder Exercises: Pulleys   Flexion 2 minutes    Scaption 2 minutes      Shoulder Exercises: ROM/Strengthening   UBE (Upper Arm Bike) L2 x 2 min bwd only      Manual Therapy   Manual Therapy Scapular mobilization    Manual therapy comments MTPR along the upper trap , biceps brachii, and levator    Joint Mobilization GHJ AP Grade IV, inferior grade IV    Scapular Mobilization scapular upward assist grade IV   pt in L sidelying with active shoulder abduction   Passive ROM flexion/ abduciton with distraction osscillations to help reduce pain                    PT Short Term Goals - 07/31/20 1115      PT SHORT TERM GOAL #1   Title pt to be IND with inital HEP    Baseline he reports compliance with HEP    Time 3    Period Weeks    Status Achieved    Target Date 07/30/20      PT SHORT TERM GOAL #2   Title pt to verbalize techniques to reduce R shoulder pain via RICE and HEP    Baseline Pt verbalizes use of HEP and rest for pain reduction    Time 3    Period Weeks    Status Achieved      PT SHORT TERM GOAL #3   Title pt to increaes R shoulder PROM/ AAROM flexion by >/= 15 degrees to promote functional mobility    Time 3    Period Weeks    Status Achieved             PT Long Term Goals - 07/31/20 1139      PT LONG TERM GOAL #1   Title increase R shoulder AROM flexion/ abduction to >/= 120 degrees and IR/ER with functional limits compard bil with </= 2/10 max pain    Baseline AROM improving    Time 6    Period Weeks    Status On-going      PT LONG TERM GOAL #2   Title pt to increase R shoulder strength to >/= 4/5 to promote stability and  functional lifting/ carrying activities    Baseline unable to test strength due to irritabiity at evaluation    Time 6    Period Weeks    Status On-going      PT LONG TERM  GOAL #3   Title pt to be able to lift/ lower >/= 8# to and from and overhead shelf carry >/= 10# in RUE with </= 2/10 max pain for functional strength    Baseline Pt reports his HEP has been going well.    Time 6    Period Weeks    Status On-going      PT LONG TERM GOAL #4   Title pt to be IND with all HEP given and is able to maintain and progress her current LOF IND.    Baseline progressing HEP    Time 6    Period Weeks    Status On-going                 Plan - 08/11/20 1055    Clinical Impression Statement pt reports decreased pain today and notes improvement after doing exercises at home.continued shoudlder PROM working into AAROM to promote shoulder ROM. He demonstrated improvement in ROM with altered focus during exercise counting backward by 5s. continued working posterior shoulder strengthening which he tolerated well.    PT Treatment/Interventions ADLs/Self Care Home Management;Cryotherapy;Gait training;Stair training;Therapeutic activities;Therapeutic exercise;Neuromuscular re-education;Patient/family education;Manual techniques;Dry needling;Taping;Vasopneumatic Device    PT Next Visit Plan update HEP. Continue shoulder mobs, and  PROM/AAROM. progress shoulder strengthening as tolerated. scapular setting, heat OK (Hx of meningioma)    PT Home Exercise Plan [id number] - pendulums, Shoulder wand flexion/ abduction, scapular retraction, upper trap stretch, table slides, ER and Row bANDS    Consulted and Agree with Plan of Care Patient           Patient will benefit from skilled therapeutic intervention in order to improve the following deficits and impairments:  Improper body mechanics,Increased muscle spasms,Decreased strength,Postural dysfunction,Pain,Decreased activity tolerance,Decreased endurance,Increased edema  Visit Diagnosis: Chronic right shoulder pain  Muscle weakness (generalized)  Localized edema  Other abnormalities of gait and mobility  Pain in right  hip  Stiffness of right hip, not elsewhere classified     Problem List Patient Active Problem List   Diagnosis Date Noted  . Essential hypertension 07/02/2020  . Prediabetes 07/02/2020  . Polyuria 07/02/2020  . Nontraumatic complete tear of right rotator cuff   . Impingement syndrome of right shoulder   . Neuropathy 12/05/2019  . 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[place]) 03/07/2019  . Seizures ([place]) 01/31/2019  . Meningioma, recurrent of brain ([place]) 01/31/2019  . Meningioma ([place]) 01/03/2019  . Diffuse idiopathic skeletal hyperostosis 06/05/2013    Starr [place] PT, DPT, LAT, ATC  08/11/20  11:02 AM      [facility] [address] [place], [place], [address] Phone: [phone]   Fax:  [phone]  Name: Joshua [name] MRN: [id number] Date of Birth: 10-03-1960

## 2020-08-13 ENCOUNTER — Other Ambulatory Visit: Payer: Self-pay

## 2020-08-13 ENCOUNTER — Ambulatory Visit: Payer: Medicaid Other | Admitting: Physical Therapy

## 2020-08-13 DIAGNOSIS — R2689 Other abnormalities of gait and mobility: Secondary | ICD-10-CM | POA: Diagnosis not present

## 2020-08-13 DIAGNOSIS — M6281 Muscle weakness (generalized): Secondary | ICD-10-CM

## 2020-08-13 DIAGNOSIS — G8929 Other chronic pain: Secondary | ICD-10-CM

## 2020-08-13 DIAGNOSIS — R600 Localized edema: Secondary | ICD-10-CM | POA: Diagnosis not present

## 2020-08-13 DIAGNOSIS — M25551 Pain in right hip: Secondary | ICD-10-CM | POA: Diagnosis not present

## 2020-08-13 DIAGNOSIS — M25651 Stiffness of right hip, not elsewhere classified: Secondary | ICD-10-CM | POA: Diagnosis not present

## 2020-08-13 DIAGNOSIS — M25511 Pain in right shoulder: Secondary | ICD-10-CM | POA: Diagnosis not present

## 2020-08-13 NOTE — Therapy (Signed)
[place], [place], [address] Phone: [phone]   Fax:  [phone]  Physical Therapy Treatment  Patient Details  Name: Joshua [name] MRN: [id number] Date of Birth: [birth year] Referring Provider (PT): Suzan Slick, NP   Encounter Date: 08/13/2020   PT End of Session - 08/13/20 1338    Visit Number 8    Number of Visits 13    Date for PT Re-Evaluation 09/03/20    Authorization Type [place] MCD - submitted on 07/09/2020, approved 3 visits, requested to resubmit on 07/31/20    Authorization Time Period 08/11/2020 - 09/21/2020    Authorization - Visit Number 2    Authorization - Number of Visits 12    PT Start Time 1333    Activity Tolerance Patient tolerated treatment well;Patient limited by pain    Behavior During Therapy [facility] for tasks assessed/performed           Past Medical History:  Diagnosis Date  . Anxiety   . Brain tumor ([place]) 02/20/2013   brain tumor removed in March 2014, Dr Donald Pore  . Diffuse idiopathic skeletal hyperostosis 06/05/2013  . Dizziness   . Enlarged prostate   . GERD (gastroesophageal reflux disease)   . Headache(784.0)    scattered  . Hypertension   . 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[place]) 03/07/2019  . Meningioma ([place])   . Meningioma, recurrent of brain ([place]) 01/31/2019  . Neuropathy 12/05/2019  . Rotator cuff tear    right  . Seizures ([place])    11/27/19 last sz 1 wk ago    Past Surgical History:  Procedure Laterality Date  . APPLICATION OF CRANIAL NAVIGATION N/A 01/10/2019   Procedure: APPLICATION OF CRANIAL NAVIGATION;  Surgeon: Erline Levine, MD;  Location: [place];  Service: Neurosurgery;  Laterality: N/A;  . CATARACT EXTRACTION Right   . CRANIOTOMY Right 11/06/2012   Procedure: CRANIOTOMY TUMOR EXCISION;  Surgeon: Erline Levine, MD;  Location: [place] [facility];  Service: Neurosurgery;  Laterality: Right;  Right Parasagittal craniotomy for meningioma with Stealth  .  CRANIOTOMY Right 01/10/2019   Procedure: Right Parasagittal Craniotomy for Tumor;  Surgeon: Erline Levine, MD;  Location: [place];  Service: Neurosurgery;  Laterality: Right;  Right parasagittal craniotomy for tumor  . ESOPHAGOGASTRODUODENOSCOPY    . right knee arthroscopy    . SHOULDER ARTHROSCOPY Right 06/09/2020   Procedure: RIGHT SHOULDER ARTHROSCOPY AND DEBRIDEMENT;  Surgeon: Newt Minion, MD;  Location: [place];  Service: Orthopedics;  Laterality: Right;    There were no vitals filed for this visit.   Subjective Assessment - 08/13/20 1339    Subjective " I feel a little sore today, I am still working on my exercsises at home."    Patient Stated Goals to decrease pain.    Currently in Pain? Yes    Pain Score 6     Pain Location Shoulder    Pain Orientation Right    Pain Descriptors / Indicators Aching;Sore    Pain Type Chronic pain    Pain Onset More than a month ago    Pain Frequency Intermittent    Aggravating Factors  sleeping    Pain Relieving Factors meds, rest,              [facility] PT Assessment - 08/13/20 0001      Assessment   Medical Diagnosis Impingement syndrome of right shoulder (M75.41)    Referring Provider (PT) Suzan Slick, NP  [place] Adult PT Treatment/Exercise - 08/13/20 0001      Shoulder Exercises: Supine   Other Supine Exercises dowel rod chest press 1 x 10    Other Supine Exercises AAROM flexion with wand short lever arm 1 x 10   cues to avoid leting elbows adduct     Shoulder Exercises: Sidelying   External Rotation Strengthening;Right;12 reps   x 2 sets   External Rotation Limitations limited mobility    Other Sidelying Exercises flexion using UE ranger combined 2x 10   combined with scapular upward rotation     Shoulder Exercises: Standing   Row Strengthening;Both;15 reps;Theraband    Theraband Level (Shoulder Row) Level 3 (Green)    Other Standing Exercises wall walks flexion 1 x 10,  sliding hand down wall to promote eccentric loading      Shoulder Exercises: Pulleys   Flexion 2 minutes    Scaption 2 minutes      Shoulder Exercises: ROM/Strengthening   UBE (Upper Arm Bike) L5 x 4 min   FWD/BWd x 2 min     Manual Therapy   Manual therapy comments MTPR along R upper trap / levat    Joint Mobilization GHJ AP Grade IV, inferior grade IV    Scapular Mobilization scapular upward assist grade IV                    PT Short Term Goals - 07/31/20 1115      PT SHORT TERM GOAL #1   Title pt to be IND with inital HEP    Baseline he reports compliance with HEP    Time 3    Period Weeks    Status Achieved    Target Date 07/30/20      PT SHORT TERM GOAL #2   Title pt to verbalize techniques to reduce R shoulder pain via RICE and HEP    Baseline Pt verbalizes use of HEP and rest for pain reduction    Time 3    Period Weeks    Status Achieved      PT SHORT TERM GOAL #3   Title pt to increaes R shoulder PROM/ AAROM flexion by >/= 15 degrees to promote functional mobility    Time 3    Period Weeks    Status Achieved             PT Long Term Goals - 07/31/20 1139      PT LONG TERM GOAL #1   Title increase R shoulder AROM flexion/ abduction to >/= 120 degrees and IR/ER with functional limits compard bil with </= 2/10 max pain    Baseline AROM improving    Time 6    Period Weeks    Status On-going      PT LONG TERM GOAL #2   Title pt to increase R shoulder strength to >/= 4/5 to promote stability and  functional lifting/ carrying activities    Baseline unable to test strength due to irritabiity at evaluation    Time 6    Period Weeks    Status On-going      PT LONG TERM GOAL #3   Title pt to be able to lift/ lower >/= 8# to and from and overhead shelf carry >/= 10# in RUE with </= 2/10 max pain for functional strength    Baseline Pt reports his HEP has been going well.    Time 6    Period Weeks    Status On-going  PT LONG TERM GOAL #4    Title pt to be IND with all HEP given and is able to maintain and progress her current LOF IND.    Baseline progressing HEP    Time 6    Period Weeks    Status On-going                 Plan - 08/13/20 1409    Clinical Impression Statement pt arrived report coninued R hsoulder pain but notes conitnued compliance with his HEP. cotninued working on shoulder ROM with focus on AAROM activities and shoulder strengthening which he does well but fatigues quickly. end of session he reported soreness from exercises but denied any increased pain.    PT Treatment/Interventions ADLs/Self Care Home Management;Cryotherapy;Gait training;Stair training;Therapeutic activities;Therapeutic exercise;Neuromuscular re-education;Patient/family education;Manual techniques;Dry needling;Taping;Vasopneumatic Device    PT Next Visit Plan update HEP. Continue shoulder mobs, and  PROM/AAROM. progress shoulder strengthening as tolerated. scapular setting, heat OK (Hx of meningioma)    PT Home Exercise Plan [id number] - pendulums, Shoulder wand flexion/ abduction, scapular retraction, upper trap stretch, table slides, ER and Row bANDS    Consulted and Agree with Plan of Care Patient           Patient will benefit from skilled therapeutic intervention in order to improve the following deficits and impairments:  Improper body mechanics,Increased muscle spasms,Decreased strength,Postural dysfunction,Pain,Decreased activity tolerance,Decreased endurance,Increased edema  Visit Diagnosis: Chronic right shoulder pain  Muscle weakness (generalized)  Localized edema  Other abnormalities of gait and mobility     Problem List Patient Active Problem List   Diagnosis Date Noted  . Essential hypertension 07/02/2020  . Prediabetes 07/02/2020  . Polyuria 07/02/2020  . Nontraumatic complete tear of right rotator cuff   . Impingement syndrome of right shoulder   . Neuropathy 12/05/2019  . Malignant meningioma of  meninges of brain ([place]) 03/07/2019  . Seizures ([place]) 01/31/2019  . Meningioma, recurrent of brain ([place]) 01/31/2019  . Meningioma ([place]) 01/03/2019  . Diffuse idiopathic skeletal hyperostosis 06/05/2013   [place] PT, DPT, LAT, ATC  08/13/20  2:13 PM      [facility] [address] [place], [place], [address] Phone: [phone]   Fax:  [phone]  Name: Joshua [name] MRN: [id number] Date of Birth: February 04, 1961

## 2020-08-14 ENCOUNTER — Ambulatory Visit: Payer: Medicaid Other | Admitting: Otolaryngology

## 2020-08-18 ENCOUNTER — Ambulatory Visit: Payer: Medicaid Other | Admitting: Physical Therapy

## 2020-08-18 ENCOUNTER — Telehealth: Payer: Self-pay | Admitting: Family Medicine

## 2020-08-18 NOTE — Telephone Encounter (Signed)
Copied from [place] [id number]. Topic: Quick Communication - See Telephone Encounter >> Aug 18, 2020 12:50 PM Loma [place] wrote: CRM for notification. See Telephone encounter for: 08/18/20. Pt was wondering if elgible for some help on test strips. Acc-U Check is the type machine. Please Fu with pt if any help is available or who to reach out to [facility] at [phone]

## 2020-08-19 ENCOUNTER — Ambulatory Visit: Payer: Medicaid Other | Attending: Family

## 2020-08-19 ENCOUNTER — Other Ambulatory Visit: Payer: Self-pay

## 2020-08-19 DIAGNOSIS — G8929 Other chronic pain: Secondary | ICD-10-CM | POA: Insufficient documentation

## 2020-08-19 DIAGNOSIS — M25511 Pain in right shoulder: Secondary | ICD-10-CM | POA: Insufficient documentation

## 2020-08-19 DIAGNOSIS — M6281 Muscle weakness (generalized): Secondary | ICD-10-CM | POA: Insufficient documentation

## 2020-08-19 NOTE — Therapy (Signed)
[facility] [address] [place], [place], [address] Phone: [phone]   Fax:  [phone]  Physical Therapy Treatment  Patient Details  Name: Joshua [name] MRN: [id number] Date of Birth: 1960/09/28 Referring Provider (PT): Suzan Slick, NP   Encounter Date: 08/19/2020   PT End of Session - 08/19/20 1433    Visit Number 9    Number of Visits 13    Date for PT Re-Evaluation 09/03/20    Authorization Type [place] MCD - submitted on 07/09/2020, approved 3 visits, requested to resubmit on 07/31/20    Authorization Time Period 08/11/2020 - 09/21/2020    Authorization - Visit Number 2    Authorization - Number of Visits 12    PT Start Time 1430    PT Stop Time 1515    PT Time Calculation (min) 45 min    Activity Tolerance Patient tolerated treatment well;Patient limited by pain    Behavior During Therapy [facility] for tasks assessed/performed           Past Medical History:  Diagnosis Date  . Anxiety   . Brain tumor ([place]) 02/20/2013   brain tumor removed in March 2014, Dr Donald Pore  . Diffuse idiopathic skeletal hyperostosis 06/05/2013  . Dizziness   . Enlarged prostate   . GERD (gastroesophageal reflux disease)   . Headache(784.0)    scattered  . Hypertension   . 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 Meningioma ([place])   . Meningioma, recurrent of brain ([place]) 01/31/2019  . Neuropathy 12/05/2019  . Rotator cuff tear    right  . Seizures ([place])    11/27/19 last sz 1 wk ago    Past Surgical History:  Procedure Laterality Date  . APPLICATION OF CRANIAL NAVIGATION N/A 01/10/2019   Procedure: APPLICATION OF CRANIAL NAVIGATION;  Surgeon: Erline Levine, MD;  Location: [place];  Service: Neurosurgery;  Laterality: N/A;  . CATARACT EXTRACTION Right   . CRANIOTOMY Right 11/06/2012   Procedure: CRANIOTOMY TUMOR EXCISION;  Surgeon: Erline Levine, MD;  Location: [place] [facility];  Service: Neurosurgery;  Laterality: Right;  Right  Parasagittal craniotomy for meningioma with Stealth  . CRANIOTOMY Right 01/10/2019   Procedure: Right Parasagittal Craniotomy for Tumor;  Surgeon: Erline Levine, MD;  Location: [place];  Service: Neurosurgery;  Laterality: Right;  Right parasagittal craniotomy for tumor  . ESOPHAGOGASTRODUODENOSCOPY    . right knee arthroscopy    . SHOULDER ARTHROSCOPY Right 06/09/2020   Procedure: RIGHT SHOULDER ARTHROSCOPY AND DEBRIDEMENT;  Surgeon: Newt Minion, MD;  Location: [place];  Service: Orthopedics;  Laterality: Right;    There were no vitals filed for this visit.   Subjective Assessment - 08/19/20 1515    Subjective Pt complaining of 6/10 R shoulder pain today. He continues with regular HEP. No specific questions or concerns upon arrival.    Limitations Lifting    How long can you sit comfortably? unlimited    How long can you stand comfortably? limited due to use of SPC in RUE 20 min    How long can you walk comfortably? limited due to use of SPC in RUE - 20 min    Diagnostic tests CT 10/15   IMPRESSION:  Complete supraspinatus and infraspinatus tendon tears with  retraction to the glenohumeral joint. Mild to moderate atrophy is  worse in the infraspinatus. Edema in the muscle bellies secondary to  atrophy is also worse in the infraspinatus.  Severe long head of biceps tendinopathy. The intra-articular segment  is markedly attenuated consistent with partial tear.     Subscapularis tendinopathy without tear.     Mild acromioclavicular and glenohumeral osteoarthritis.     Subacromial/subdeltoid bursitis.     No acute bony abnormality.    Patient Stated Goals to decrease pain.    Currently in Pain? Yes    Pain Score 6     Pain Location Shoulder    Pain Orientation Right    Pain Descriptors / Indicators Sharp    Pain Type Chronic pain    Pain Onset More than a month ago    Pain Frequency Intermittent              TREATMENT   Ther-ex  UBE x 4 minutes for warm-up  during history (2 minutes forward/2 minutes backwards); Supine chest press with cane x 10; Supine cane short lever arm R shoulder flexion with light LUE assist x 10; L sidelying R shoulder external rotation 2 x 10; L sidelying R shoulder scaption x 5, deferred second set due to increase in pain during first set; Shoulder wall walks for flexion and abduction x 5 each; Standing low rows with green tband x 10, verbal and tactile cues for scapular retraction; Cold pack applied to R shoulder at end of session, unbilled;   Manual Therapy  PROM R shoulder flexion, abduction, and external rotation; R GH A/P mobilizations at neutral, grade III, 30s/bout x 3 bouts, also attempted at 90 flexion but discontinued secondary to pain; R GH superior to inferior mobilizations at 90 abduction, grade III, 30s/bout x 3 bouts; R GH A/P mobilizations at 90 abduction and available end range ER, grade III, 30s/bout x 3 bouts;  Pt educated throughout session about proper posture and technique with exercises. Improved exercise technique, movement at target joints, use of target muscles after min to mod verbal, visual, tactile cues.    Pt demonstrates excellent motivation during session. Continued with manual techniques, PROM, AAROM, and light strengthening. He demonstrates profound weakness in multiple planes. Pt encouraged to continue HEP and follow-up as scheduled. Will continue to progress strengthening at future sessions. Pt will benefit from PT services to address deficits in R shoulder range of motion and strength in order to return to full function at home.                    PT Short Term Goals - 07/31/20 1115      PT SHORT TERM GOAL #1   Title pt to be IND with inital HEP    Baseline he reports compliance with HEP    Time 3    Period Weeks    Status Achieved    Target Date 07/30/20      PT SHORT TERM GOAL #2   Title pt to verbalize techniques to reduce R shoulder pain via RICE and HEP     Baseline Pt verbalizes use of HEP and rest for pain reduction    Time 3    Period Weeks    Status Achieved      PT SHORT TERM GOAL #3   Title pt to increaes R shoulder PROM/ AAROM flexion by >/= 15 degrees to promote functional mobility    Time 3    Period Weeks    Status Achieved             PT Long Term Goals - 07/31/20 1139      PT LONG TERM  GOAL #1   Title increase R shoulder AROM flexion/ abduction to >/= 120 degrees and IR/ER with functional limits compard bil with </= 2/10 max pain    Baseline AROM improving    Time 6    Period Weeks    Status On-going      PT LONG TERM GOAL #2   Title pt to increase R shoulder strength to >/= 4/5 to promote stability and  functional lifting/ carrying activities    Baseline unable to test strength due to irritabiity at evaluation    Time 6    Period Weeks    Status On-going      PT LONG TERM GOAL #3   Title pt to be able to lift/ lower >/= 8# to and from and overhead shelf carry >/= 10# in RUE with </= 2/10 max pain for functional strength    Baseline Pt reports his HEP has been going well.    Time 6    Period Weeks    Status On-going      PT LONG TERM GOAL #4   Title pt to be IND with all HEP given and is able to maintain and progress her current LOF IND.    Baseline progressing HEP    Time 6    Period Weeks    Status On-going                 Plan - 08/19/20 1513    Clinical Impression Statement Pt demonstrates excellent motivation during session. Continued with manual techniques, PROM, AAROM, and light strengthening. He demonstrates profound weakness in multiple planes. Pt encouraged to continue HEP and follow-up as scheduled. Will continue to progress strengthening at future sessions. Pt will benefit from PT services to address deficits in R shoulder range of motion and strength in order to return to full function at home.    PT Treatment/Interventions ADLs/Self Care Home Management;Cryotherapy;Gait training;Stair  training;Therapeutic activities;Therapeutic exercise;Neuromuscular re-education;Patient/family education;Manual techniques;Dry needling;Taping;Vasopneumatic Device    PT Next Visit Plan Progress note; update HEP. Continue shoulder mobs, and  PROM/AAROM. progress shoulder strengthening as tolerated. scapular setting, heat OK (Hx of meningioma)    PT Home Exercise Plan [id number] - pendulums, Shoulder wand flexion/ abduction, scapular retraction, upper trap stretch, table slides, ER and Row bANDS    Consulted and Agree with Plan of Care Patient           Patient will benefit from skilled therapeutic intervention in order to improve the following deficits and impairments:  Improper body mechanics,Increased muscle spasms,Decreased strength,Postural dysfunction,Pain,Decreased activity tolerance,Decreased endurance,Increased edema  Visit Diagnosis: Chronic right shoulder pain  Muscle weakness (generalized)     Problem List Patient Active Problem List   Diagnosis Date Noted  . Essential hypertension 07/02/2020  . Prediabetes 07/02/2020  . Polyuria 07/02/2020  . Nontraumatic complete tear of right rotator cuff   . Impingement syndrome of right shoulder   . Neuropathy 12/05/2019  . 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[place]) 03/07/2019  . Seizures ([place]) 01/31/2019  . Meningioma, recurrent of brain ([place]) 01/31/2019  . Meningioma ([place]) 01/03/2019  . Diffuse idiopathic skeletal hyperostosis 06/05/2013   Phillips Grout PT, DPT, GCS  Miliani Deike 08/19/2020, 4:16 PM  [facility] [address] [place], [place], [address] Phone: [phone]   Fax:  [phone]  Name: Joshua [name] MRN: [id number] Date of Birth: 1960/09/09

## 2020-08-20 ENCOUNTER — Ambulatory Visit: Payer: Medicaid Other | Admitting: Physical Therapy

## 2020-08-21 NOTE — Patient Instructions (Incomplete)
Physical Therapy Progress Note   Dates of reporting period  07/09/21   to   08/21/20  TREATMENT   Ther-ex  Outcome measures and goals updated with patient; UBE x 4 minutes for warm-up during history (2 minutes forward/2 minutes backwards); Supine chest press with cane x 10; Supine cane short lever arm R shoulder flexion with light LUE assist x 10; L sidelying R shoulder external rotation 2 x 10; L sidelying R shoulder scaption x 5, deferred second set due to increase in pain during first set; Shoulder wall walks for flexion and abduction x 5 each; Standing low rows with green tband x 10, verbal and tactile cues for scapular retraction; Cold pack applied to R shoulder at end of session, unbilled;   Manual Therapy  PROM R shoulder flexion, abduction, and external rotation; R GH A/P mobilizations at neutral, grade III, 30s/bout x 3 bouts, also attempted at 90 flexion but discontinued secondary to pain; R GH superior to inferior mobilizations at 90 abduction, grade III, 30s/bout x 3 bouts; R GH A/P mobilizations at 90 abduction and available end range ER, grade III, 30s/bout x 3 bouts;  Pt educated throughout session about proper posture and technique with exercises. Improved exercise technique, movement at target joints, use of target muscles after min to mod verbal, visual, tactile cues.    Pt demonstrates excellent motivation during session. Continued with manual techniques, PROM, AAROM, and light strengthening. He demonstrates profound weakness in multiple planes. Pt encouraged to continue HEP and follow-up as scheduled. Will continue to progress strengthening at future sessions. Pt will benefit from PT services to address deficits in R shoulder range of motion and strength in order to return to full function at home.

## 2020-08-24 ENCOUNTER — Ambulatory Visit: Payer: Medicaid Other

## 2020-08-24 ENCOUNTER — Other Ambulatory Visit: Payer: Self-pay

## 2020-08-24 DIAGNOSIS — M25511 Pain in right shoulder: Secondary | ICD-10-CM

## 2020-08-24 DIAGNOSIS — G8929 Other chronic pain: Secondary | ICD-10-CM

## 2020-08-24 DIAGNOSIS — M6281 Muscle weakness (generalized): Secondary | ICD-10-CM

## 2020-08-24 NOTE — Therapy (Signed)
[facility] [address]. [place], [place], [address] Phone: [phone]   Fax:  [phone]   Physical Therapy Progress Note/Treatment   Dates of reporting period  07/09/20   to   08/24/20  Patient Details  Name: Joshua [name] MRN: [id number] Date of Birth: 1960/12/28 Referring Provider (PT): Suzan Slick, NP   Encounter Date: 08/24/2020   PT End of Session - 08/24/20 1352    Visit Number 10    Number of Visits 13    Date for PT Re-Evaluation 09/03/20    Authorization Type [place] [employer] - submitted on 07/09/2020, approved 3 visits, requested to resubmit on 07/31/20    Authorization Time Period 08/11/2020 - 09/21/2020    Authorization - Visit Number 3    Authorization - Number of Visits 12    PT Start Time 1340    PT Stop Time 1430    PT Time Calculation (min) 50 min    Activity Tolerance Patient tolerated treatment well;Patient limited by pain    Behavior During Therapy [facility] for tasks assessed/performed           Past Medical History:  Diagnosis Date  . Anxiety   . Brain tumor ([place]) 02/20/2013   brain tumor removed in March 2014, Dr Donald Pore  . Diffuse idiopathic skeletal hyperostosis 06/05/2013  . Dizziness   . Enlarged prostate   . GERD (gastroesophageal reflux disease)   . Headache(784.0)    scattered  . Hypertension   . 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[place]) 03/07/2019  . Meningioma ([place])   . Meningioma, recurrent of brain ([place]) 01/31/2019  . Neuropathy 12/05/2019  . Rotator cuff tear    right  . Seizures ([place])    11/27/19 last sz 1 wk ago    Past Surgical History:  Procedure Laterality Date  . APPLICATION OF CRANIAL NAVIGATION N/A 01/10/2019   Procedure: APPLICATION OF CRANIAL NAVIGATION;  Surgeon: Erline Levine, MD;  Location: [place];  Service: Neurosurgery;  Laterality: N/A;  . CATARACT EXTRACTION Right   . CRANIOTOMY Right 11/06/2012   Procedure: CRANIOTOMY TUMOR EXCISION;  Surgeon: Erline Levine, MD;   Location: [place] NEUR[facility];  Service: Neurosurgery;  Laterality: Right;  Right Parasagittal craniotomy for meningioma with Stealth  . CRANIOTOMY Right 01/10/2019   Procedure: Right Parasagittal Craniotomy for Tumor;  Surgeon: Erline Levine, MD;  Location: [place];  Service: Neurosurgery;  Laterality: Right;  Right parasagittal craniotomy for tumor  . ESOPHAGOGASTRODUODENOSCOPY    . right knee arthroscopy    . SHOULDER ARTHROSCOPY Right 06/09/2020   Procedure: RIGHT SHOULDER ARTHROSCOPY AND DEBRIDEMENT;  Surgeon: Newt Minion, MD;  Location: [place];  Service: Orthopedics;  Laterality: Right;    There were no vitals filed for this visit.   Subjective Assessment - 08/24/20 1350    Subjective Pt denies any resting R shoulder pain upon arrival today. He continues with regular HEP. He reports increase in "popping and grinding" in his R shoulder since last therapy session but it is not associated with pain. No specific questions or concerns upon arrival.    Limitations Lifting    How long can you sit comfortably? unlimited    How long can you stand comfortably? limited due to use of SPC in RUE 20 min    How long can you walk comfortably? limited due to use of SPC in RUE - 20 min    Diagnostic tests CT 10/15   IMPRESSION:  Complete supraspinatus  and infraspinatus tendon tears with  retraction to the glenohumeral joint. Mild to moderate atrophy is  worse in the infraspinatus. Edema in the muscle bellies secondary to  atrophy is also worse in the infraspinatus.     Severe long head of biceps tendinopathy. The intra-articular segment  is markedly attenuated consistent with partial tear.     Subscapularis tendinopathy without tear.     Mild acromioclavicular and glenohumeral osteoarthritis.     Subacromial/subdeltoid bursitis.     No acute bony abnormality.    Patient Stated Goals to decrease pain.    Currently in Pain? No/denies   No resting pain upon arrival               TREATMENT   Ther-ex  UBE x 4 minutes for warm-up during history (2 minutes forward/2 minutes backwards); Updated outcome measures and goals with patient: R shoulder AROM (degrees): flexion: 155, abduction: 135, HBB: L1 HBH: back of neck R shoulder MMT (out of 5): flexion: 2+/5, abduction: 2+/5, IR: strong (sitting), ER: grossly weak (sitting); Pt unable to lift weight from overhead utilizing RUE at this time; Supine chest press with cane x 10; Supine cane short lever arm R shoulder flexion with light assist x 5, discontinued secondary to pain; L sidelying R shoulder external rotation x 10; Shoulder wall walks for flexion x 5; Cold pack applied to R shoulder at end of session, unbilled;   Manual Therapy  PROM R shoulder flexion, abduction, internal and external rotation; R GH A/P mobilizations at neutral, grade III, 30s/bout x 3 bouts, also attempted at 90 flexion but discontinued secondary to pain; R GH superior to inferior mobilizations at 90 abduction, grade III, 30s/bout x 3 bouts; R GH A/P mobilizations at 90 abduction and available end range ER, grade III, 30s/bout x 3 bouts;   Pt educated throughout session about proper posture and technique with exercises. Improved exercise technique, movement at target joints, use of target muscles after min to mod verbal, visual, tactile cues.    Pt demonstrates excellent motivation during session. Continued with manual techniques, PROM, AAROM, and light strengthening. Updated outcome measures and goals with patient today. He demonstrates improvement in AROM R shoulder flexion and abduction. ER and IR remain significantly limited. Strength testing performed today and he demonstrates 2+/5 R shoulder flexion and abduction. IR/ER measured in sitting and ER is profoundly weak however IR is strong. He is still unable to functionally utilize RUE for lifting weight overhead. Pt encouraged to continue HEP and follow-up as scheduled. Will  continue to progress strengthening at future sessions. Pt will benefit from PT services to address deficits in R shoulder range of motion and strength in order to return to full function at home.                                   PT Short Term Goals - 08/24/20 1353      PT SHORT TERM GOAL #1   Title pt to be IND with inital HEP    Baseline he reports compliance with HEP    Time 3    Period Weeks    Status Achieved    Target Date 07/30/20      PT SHORT TERM GOAL #2   Title pt to verbalize techniques to reduce R shoulder pain via RICE and HEP    Baseline Pt verbalizes use of HEP and rest for pain reduction  Time 3    Period Weeks    Status Achieved      PT SHORT TERM GOAL #3   Title pt to increaes R shoulder PROM/ AAROM flexion by >/= 15 degrees to promote functional mobility    Time 3    Period Weeks    Status Achieved             PT Long Term Goals - 08/24/20 1354      PT LONG TERM GOAL #1   Title increase R shoulder AROM flexion/ abduction to >/= 120 degrees and IR/ER with functional limits compard bil with </= 2/10 max pain    Baseline AROM improving; 08/24/20: flexion: 155, abduction: 135, HBB: L1 HBH: back of neck    Time 6    Period Weeks    Status On-going    Target Date 09/03/20      PT LONG TERM GOAL #2   Title pt to increase R shoulder strength to >/= 4/5 to promote stability and  functional lifting/ carrying activities    Baseline unable to test strength due to irritabiity at evaluation; 08/24/20: R shoulder (out of 5): flexion: 2+/5, abduction: 2+/5, IR: strong (sitting), ER: grossly weak (sitting);    Time 6    Period Weeks    Status On-going    Target Date 09/03/20      PT LONG TERM GOAL #3   Title pt to be able to lift/ lower >/= 8# to and from and overhead shelf carry >/= 10# in RUE with </= 2/10 max pain for functional strength    Baseline Pt reports his HEP has been going well.; 08/24/20: still unabel to lift to/from  overhead shelf;    Time 6    Period Weeks    Status On-going    Target Date 09/03/20      PT LONG TERM GOAL #4   Title pt to be IND with all HEP given and is able to maintain and progress her current LOF IND.    Baseline progressing HEP; 08/24/20: progressing HEP    Time 6    Period Weeks    Status On-going    Target Date 09/03/20                 Plan - 08/24/20 1353    Clinical Impression Statement Pt demonstrates excellent motivation during session. Continued with manual techniques, PROM, AAROM, and light strengthening. Updated outcome measures and goals with patient today. He demonstrates improvement in AROM R shoulder flexion and abduction. ER and IR remain significantly limited. Strength testing performed today and he demonstrates 2+/5 R shoulder flexion and abduction. IR/ER measured in sitting and ER is profoundly weak however IR is strong. He is still unable to functionally utilize RUE for lifting weight overhead. Pt encouraged to continue HEP and follow-up as scheduled. Will continue to progress strengthening at future sessions. Pt will benefit from PT services to address deficits in R shoulder range of motion and strength in order to return to full function at home.    Personal Factors and Comorbidities Comorbidity 3+    Examination-Activity Limitations Stand;Lift;Locomotion Level;Carry;Dressing;Hygiene/Grooming    Examination-Participation Restrictions Community Activity;Interpersonal Relationship    PT Frequency 2x / week    PT Duration 6 weeks    PT Treatment/Interventions ADLs/Self Care Home Management;Cryotherapy;Gait training;Stair training;Therapeutic activities;Therapeutic exercise;Neuromuscular re-education;Patient/family education;Manual techniques;Dry needling;Taping;Vasopneumatic Device    PT Next Visit Plan Progress note; update HEP. Continue shoulder mobs, and  PROM/AAROM. progress shoulder strengthening as tolerated.  scapular setting, heat OK (Hx of meningioma)     PT Home Exercise Plan [id number] - pendulums, Shoulder wand flexion/ abduction, scapular retraction, upper trap stretch, table slides, ER and Row bANDS    Consulted and Agree with Plan of Care Patient           Patient will benefit from skilled therapeutic intervention in order to improve the following deficits and impairments:  Improper body mechanics,Increased muscle spasms,Decreased strength,Postural dysfunction,Pain,Decreased activity tolerance,Decreased endurance,Increased edema  Visit Diagnosis: Chronic right shoulder pain  Muscle weakness (generalized)     Problem List Patient Active Problem List   Diagnosis Date Noted  . Essential hypertension 07/02/2020  . Prediabetes 07/02/2020  . Polyuria 07/02/2020  . Nontraumatic complete tear of right rotator cuff   . Impingement syndrome of right shoulder   . Neuropathy 12/05/2019  . 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[place]) 03/07/2019  . Seizures ([place]) 01/31/2019  . Meningioma, recurrent of brain ([place]) 01/31/2019  . Meningioma ([place]) 01/03/2019  . Diffuse idiopathic skeletal hyperostosis 06/05/2013   Phillips Grout PT, DPT, GCS  Huprich,Jason 08/24/2020, 2:49 PM  [place] [facility] [address]. [place], [place], [address] Phone: [phone]   Fax:  [phone]  Name: Joshua [name] MRN: [id number] Date of Birth: 1961/03/24

## 2020-08-25 ENCOUNTER — Ambulatory Visit: Payer: Medicaid Other

## 2020-08-25 ENCOUNTER — Other Ambulatory Visit: Payer: Self-pay | Admitting: Internal Medicine

## 2020-08-25 ENCOUNTER — Encounter: Payer: Medicaid Other | Admitting: Physical Therapy

## 2020-08-25 DIAGNOSIS — C700 Malignant neoplasm of cerebral meninges: Secondary | ICD-10-CM

## 2020-08-26 ENCOUNTER — Telehealth: Payer: Self-pay | Admitting: Orthopedic Surgery

## 2020-08-26 ENCOUNTER — Ambulatory Visit: Payer: Self-pay | Admitting: Physician Assistant

## 2020-08-26 NOTE — Telephone Encounter (Signed)
Called and lm on vm to advise of message below. To call if he would like to set up appt with Dr. Marlou Sa to discuss treatment options.

## 2020-08-26 NOTE — Telephone Encounter (Signed)
Sling would not be helpful. Told him last refill was his last .I had discussed with him at last visit chronic pain management or referrel to be evaluated for possible Reverse Total shoulder replacement ( Dr. Marlou Sa)

## 2020-08-26 NOTE — Telephone Encounter (Signed)
Pt is s/p shoulder scope 06/09/20 please see message below and advise

## 2020-08-26 NOTE — Telephone Encounter (Signed)
Pt would like to know if he can get a brace or sling or some type of medicationfor his shoulder. He is in a lot of pain.

## 2020-08-27 ENCOUNTER — Other Ambulatory Visit: Payer: Self-pay | Admitting: Family Medicine

## 2020-08-27 ENCOUNTER — Telehealth: Payer: Self-pay

## 2020-08-27 ENCOUNTER — Encounter: Payer: Medicaid Other | Admitting: Physical Therapy

## 2020-08-27 DIAGNOSIS — I700 Atherosclerosis of aorta: Secondary | ICD-10-CM

## 2020-08-27 NOTE — Telephone Encounter (Signed)
Patient called regarding last message he would like a call back :[phone]

## 2020-08-27 NOTE — Telephone Encounter (Signed)
Called Joshua [name] and lm on vm to advise that we can not  give rx for pain medication and that he does not require a sling per PA. Can make an appt with Dr. Marlou Sa here in our office to discuss possible surgical treatment options and if he is interested in doing this can give a call back and leave a message to advise.

## 2020-08-28 ENCOUNTER — Other Ambulatory Visit: Payer: Self-pay

## 2020-08-28 ENCOUNTER — Ambulatory Visit: Payer: Medicaid Other | Attending: Otolaryngology | Admitting: Otolaryngology

## 2020-08-28 ENCOUNTER — Encounter: Payer: Self-pay | Admitting: Physician Assistant

## 2020-08-28 ENCOUNTER — Ambulatory Visit: Payer: Medicaid Other

## 2020-08-28 ENCOUNTER — Encounter: Payer: Self-pay | Admitting: Otolaryngology

## 2020-08-28 ENCOUNTER — Ambulatory Visit (INDEPENDENT_AMBULATORY_CARE_PROVIDER_SITE_OTHER): Payer: Medicaid Other | Admitting: Physician Assistant

## 2020-08-28 VITALS — BP 154/83 | HR 78 | Resp 16 | Wt 197.0 lb

## 2020-08-28 DIAGNOSIS — K2100 Gastro-esophageal reflux disease with esophagitis, without bleeding: Secondary | ICD-10-CM | POA: Diagnosis not present

## 2020-08-28 DIAGNOSIS — M7541 Impingement syndrome of right shoulder: Secondary | ICD-10-CM

## 2020-08-28 DIAGNOSIS — G8929 Other chronic pain: Secondary | ICD-10-CM

## 2020-08-28 DIAGNOSIS — M6281 Muscle weakness (generalized): Secondary | ICD-10-CM | POA: Diagnosis not present

## 2020-08-28 DIAGNOSIS — M25511 Pain in right shoulder: Secondary | ICD-10-CM | POA: Diagnosis not present

## 2020-08-28 MED ORDER — PANTOPRAZOLE SODIUM 20 MG PO TBEC: 20.0000 mg | DELAYED_RELEASE_TABLET | Freq: Two times a day (BID) | ORAL | 3 refills | Status: DC

## 2020-08-28 NOTE — Therapy (Signed)
[place] [facility] [address] [place], [place], [address] Phone: [phone]   Fax:  [phone]  Physical Therapy Treatment  Patient Details  Name: Joshua [name] MRN: [id number] Date of Birth: 10-25-1960 Referring Provider (PT): Suzan Slick, NP   Encounter Date: 08/28/2020   PT End of Session - 08/28/20 1057    Visit Number 11    Number of Visits 13    Date for PT Re-Evaluation 09/03/20    Authorization Type Medicaid approved 08/26/20-10/06/20 (12 visits)    Authorization Time Period --    Authorization - Visit Number 2    Authorization - Number of Visits 12    PT Start Time 1050    PT Stop Time 1145    PT Time Calculation (min) 55 min    Activity Tolerance Patient tolerated treatment well;Patient limited by pain    Behavior During Therapy [facility] for tasks assessed/performed           Past Medical History:  Diagnosis Date  . Anxiety   . Brain tumor ([place]) 02/20/2013   brain tumor removed in March 2014, Dr Donald Pore  . Diffuse idiopathic skeletal hyperostosis 06/05/2013  . Dizziness   . Enlarged prostate   . GERD (gastroesophageal reflux disease)   . Headache(784.0)    scattered  . Hypertension   . 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[place]) 03/07/2019  . Meningioma ([place])   . Meningioma, recurrent of brain ([place]) 01/31/2019  . Neuropathy 12/05/2019  . Rotator cuff tear    right  . Seizures ([place])    11/27/19 last sz 1 wk ago    Past Surgical History:  Procedure Laterality Date  . APPLICATION OF CRANIAL NAVIGATION N/A 01/10/2019   Procedure: APPLICATION OF CRANIAL NAVIGATION;  Surgeon: Erline Levine, MD;  Location: [place];  Service: Neurosurgery;  Laterality: N/A;  . CATARACT EXTRACTION Right   . CRANIOTOMY Right 11/06/2012   Procedure: CRANIOTOMY TUMOR EXCISION;  Surgeon: Erline Levine, MD;  Location: [place] NEURO ORS;  Service: Neurosurgery;  Laterality: Right;  Right Parasagittal craniotomy for meningioma with Stealth  .  CRANIOTOMY Right 01/10/2019   Procedure: Right Parasagittal Craniotomy for Tumor;  Surgeon: Erline Levine, MD;  Location: [place];  Service: Neurosurgery;  Laterality: Right;  Right parasagittal craniotomy for tumor  . ESOPHAGOGASTRODUODENOSCOPY    . right knee arthroscopy    . SHOULDER ARTHROSCOPY Right 06/09/2020   Procedure: RIGHT SHOULDER ARTHROSCOPY AND DEBRIDEMENT;  Surgeon: Newt Minion, MD;  Location: [place];  Service: Orthopedics;  Laterality: Right;    There were no vitals filed for this visit.   Subjective Assessment - 08/28/20 1051    Subjective Pt reports overdoing his HEP causing his shoulder to flare up yesterday.  He reports 7/10 pain upon arrival today.  Shoulder felt "ok" and relaxed after last session.  Still feeling the "popping and grinding".  Finds more relief using the lidocane and "green alcohol" than oxicodone.    Limitations Lifting    How long can you sit comfortably? unlimited    How long can you stand comfortably? limited due to use of SPC in RUE 20 min    How long can you walk comfortably? limited due to use of SPC in RUE - 20 min    Diagnostic tests CT 10/15   IMPRESSION:  Complete supraspinatus and infraspinatus tendon tears with  retraction to the glenohumeral joint. Mild to moderate atrophy is  worse in the infraspinatus. Edema in  the muscle bellies secondary to  atrophy is also worse in the infraspinatus.     Severe long head of biceps tendinopathy. The intra-articular segment  is markedly attenuated consistent with partial tear.     Subscapularis tendinopathy without tear.     Mild acromioclavicular and glenohumeral osteoarthritis.     Subacromial/subdeltoid bursitis.     No acute bony abnormality.    Patient Stated Goals to decrease pain.    Currently in Pain? Yes    Pain Score 7     Pain Location Shoulder    Pain Orientation Right    Pain Descriptors / Indicators Sharp    Pain Type Chronic pain    Pain Onset More than a month ago     Pain Frequency Intermittent           TREATMENT   Ther-ex  UBE x 8 minutes for warm-up during history (4 minutes forward/4 minutes backwards); Supine chest press with cane x 10; discontinued secondary to pain  L sidelying R shoulder external rotation x 10; R sided rail slides for flexion and abduction x15 each ; Shoulder wall walks for flexion x 5; Cold pack applied to R shoulder at end of session, 5 min unbilled;     Manual Therapy  PROM R shoulder flexion, abduction, internal and external rotation; R GH A/P mobilizations at neutral, grade III, 30s/bout x 3 bouts, also attempted at 90 flexion but  R GH superior to inferior mobilizations at 90 abduction, grade III, 30s/bout x 3 bouts; R GH A/P mobilizations at 90 abduction and available end range ER, grade III, 30s/bout x 3 bouts;     Pt educated throughout session about proper posture and technique with exercises. Improved exercise technique, movement at target joints, use of target muscles after min to mod verbal, visual, tactile cues.   Pt demonstrates excellent motivation during session.  Continued with manual techniques for PROM, AAROM and light strengthening.  Pt reported pain prior to session and said his shoulder was flared up.  Manual therapy was completed without increasing pain.  The patient completed the ther-ex with minimal increase in pain.  Supine cane presses were discontinued secondary to pain.  Visual and tactile cues were provided throughout exercises to assure correct form and pain management.  Pt encouraged to continue HEP exactly as prescribed to limit overdoing it and causing more flare up. Will continue to progress strengthening at future sessions. Pt will benefit from PT services to address deficits in R shoulder range of motion and strength in order to return to full function at home.                             PT Short Term Goals - 08/24/20 1353      PT SHORT TERM GOAL #1   Title pt  to be IND with inital HEP    Baseline he reports compliance with HEP    Time 3    Period Weeks    Status Achieved    Target Date 07/30/20      PT SHORT TERM GOAL #2   Title pt to verbalize techniques to reduce R shoulder pain via RICE and HEP    Baseline Pt verbalizes use of HEP and rest for pain reduction    Time 3    Period Weeks    Status Achieved      PT SHORT TERM GOAL #3   Title pt to increaes R  shoulder PROM/ AAROM flexion by >/= 15 degrees to promote functional mobility    Time 3    Period Weeks    Status Achieved             PT Long Term Goals - 08/24/20 1354      PT LONG TERM GOAL #1   Title increase R shoulder AROM flexion/ abduction to >/= 120 degrees and IR/ER with functional limits compard bil with </= 2/10 max pain    Baseline AROM improving; 08/24/20: flexion: 155, abduction: 135, HBB: L1 HBH: back of neck    Time 6    Period Weeks    Status On-going    Target Date 09/03/20      PT LONG TERM GOAL #2   Title pt to increase R shoulder strength to >/= 4/5 to promote stability and  functional lifting/ carrying activities    Baseline unable to test strength due to irritabiity at evaluation; 08/24/20: R shoulder (out of 5): flexion: 2+/5, abduction: 2+/5, IR: strong (sitting), ER: grossly weak (sitting);    Time 6    Period Weeks    Status On-going    Target Date 09/03/20      PT LONG TERM GOAL #3   Title pt to be able to lift/ lower >/= 8# to and from and overhead shelf carry >/= 10# in RUE with </= 2/10 max pain for functional strength    Baseline Pt reports his HEP has been going well.; 08/24/20: still unabel to lift to/from overhead shelf;    Time 6    Period Weeks    Status On-going    Target Date 09/03/20      PT LONG TERM GOAL #4   Title pt to be IND with all HEP given and is able to maintain and progress her current LOF IND.    Baseline progressing HEP; 08/24/20: progressing HEP    Time 6    Period Weeks    Status On-going    Target Date 09/03/20                  Plan - 08/28/20 1158    Clinical Impression Statement Pt demonstrates excellent motivation during session.  Continued with manual techniques for PROM, AAROM and light strengthening.  Pt reported pain prior to session and said his shoulder was flared up.  Manual therapy was completed without increasing pain.  The patient completed the ther-ex with minimal increase in pain.  Supine cane presses were discontinued secondary to pain.  Visual and tactile cues were provided throughout exercises to assure correct form and pain management.  Pt encouraged to continue HEP exactly as prescribed to limit overdoing it and causing more flare up. Will continue to progress strengthening at future sessions. Pt will benefit from PT services to address deficits in R shoulder range of motion and strength in order to return to full function at home.    Personal Factors and Comorbidities Comorbidity 3+    Examination-Activity Limitations Stand;Lift;Locomotion Level;Carry;Dressing;Hygiene/Grooming    Examination-Participation Restrictions Community Activity;Interpersonal Relationship    PT Frequency 2x / week    PT Duration 6 weeks    PT Treatment/Interventions ADLs/Self Care Home Management;Cryotherapy;Gait training;Stair training;Therapeutic activities;Therapeutic exercise;Neuromuscular re-education;Patient/family education;Manual techniques;Dry needling;Taping;Vasopneumatic Device    PT Next Visit Plan Progress note; update HEP. Continue shoulder mobs, and  PROM/AAROM. progress shoulder strengthening as tolerated. scapular setting, heat OK (Hx of meningioma)    PT Home Exercise Plan [id number] - pendulums, Shoulder wand flexion/ abduction, scapular  retraction, upper trap stretch, table slides, ER and Row bANDS    Consulted and Agree with Plan of Care Patient           Patient will benefit from skilled therapeutic intervention in order to improve the following deficits and impairments:  Improper body  mechanics,Increased muscle spasms,Decreased strength,Postural dysfunction,Pain,Decreased activity tolerance,Decreased endurance,Increased edema  Visit Diagnosis: Chronic right shoulder pain  Muscle weakness (generalized)     Problem List Patient Active Problem List   Diagnosis Date Noted  . Essential hypertension 07/02/2020  . Prediabetes 07/02/2020  . Polyuria 07/02/2020  . Nontraumatic complete tear of right rotator cuff   . Impingement syndrome of right shoulder   . Neuropathy 12/05/2019  . 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[place]) 03/07/2019  . Seizures ([place]) 01/31/2019  . Meningioma, recurrent of brain ([place]) 01/31/2019  . Meningioma ([place]) 01/03/2019  . Diffuse idiopathic skeletal hyperostosis 06/05/2013    This entire session was performed under direct supervision and direction of a licensed therapist/therapist assistant . I have personally read, edited and approve of the note as written.   Rosalee Kaufman, SPT Phillips Grout PT, DPT, GCS  Huprich,Jason 08/28/2020, 7:54 PM  [facility] [address] [place], [place], [address] Phone: [phone]   Fax:  [phone]  Name: FERMAN BASILIO MRN: [id number] Date of Birth: 07/30/1960

## 2020-08-28 NOTE — Progress Notes (Signed)
Chief complaint: Globus sensation  History: 60 year old black male has a known history of reflux for which he takes sucralfate 4 times daily, and Protonix as needed.  He is not clearly able to identify reflux symptoms.  He brings up some thick and sometimes sticky mucus.  It is not colorful.  It is not bloody.  No pain.  No difficulty swallowing foods or liquids.  No neck masses.  He  quit smoking and drinking 5 months ago.  No history of cancer.  He has had a brain meningioma resected twice by Dr. Vertell Limber, most recently 2 years ago.  He has some residual neurologic issues.  Examination: He is trim and basically healthy-appearing.  He has visible scars on his scalp.  Ear canals are clear with normal drums.  Anterior nose is moist and patent.  Oral cavity is clear with teeth in good repair.  Oropharynx is clear.  Neck without adenopathy.  Normal laryngeal crepitus.  Impression: Probable reflux with globus symptoms.  Plan: We will stop sucralfate.  I would have him continue Protonix twice daily before meals.  I will see him back in 1 month.  If he is still having issues at that time, I would recommend for a flexible laryngoscopy.

## 2020-08-28 NOTE — Progress Notes (Signed)
Office Visit Note   Patient: Joshua [name]           Date of Birth: 04-01-1961           MRN: [id number] Visit Date: 08/28/2020              Requested by: Charlott Rakes, MD [place],  [place] [id number] PCP: Charlott Rakes, MD  Chief Complaint  Patient presents with  . Right Shoulder - Pain    06/09/20 right shoulder scope and debridement       HPI: Patient is a 60 year old gentleman who comes in today for an unexpected visit.  He has advanced arthritis and a rotator cuff tear of his shoulder.  I last saw him in January when he was not feeling any improvement from his previous shoulder arthroscopy on the right which was done in November.  That time I presented him with 3 options.  A course of physical therapy chronic pain management her meeting with Dr. Marlou Sa to learn more about reverse total shoulder and if he was a candidate.  Assessment & Plan: Visit Diagnoses: No diagnosis found.  Plan: Patient does not want any more pain medication.  I explained to him why sling would probably not be helpful and could be a hindrance in his particular situation as it would make his shoulder more stiff.  He may come in as needed for a cortisone injection into the shoulder.  He is going to pursue aqua therapy which is helped him in the past.  He may follow-up with [place] as needed  Follow-Up Instructions: No follow-ups on file.   Ortho Exam  Patient is alert, oriented, no adenopathy, well-dressed, normal affect, normal respiratory effort. Right shoulder well-healed surgical portals limited range of motion secondary to rotator cuff tear and arthritis  Imaging: No results found. No images are attached to the encounter.  Labs: Lab Results  Component Value Date   HGBA1C 6.2 (A) 07/01/2020   HGBA1C 6.3 (H) 04/15/2020   HGBA1C 6.2 (H) 12/03/2019   REPTSTATUS 02/28/2020 FINAL 02/27/2020   CULT MULTIPLE SPECIES PRESENT, SUGGEST RECOLLECTION (A) 02/27/2020     Lab Results   Component Value Date   ALBUMIN 4.2 04/25/2020   ALBUMIN 4.7 04/15/2020   ALBUMIN 4.3 03/10/2020    Lab Results  Component Value Date   MG 1.8 07/09/2019   No results found for: VD25OH  No results found for: PREALBUMIN CBC EXTENDED Latest Ref Rng & Units 07/18/2020 06/21/2020 06/11/2020  WBC 4.0 - 10.5 K/uL 8.4 8.7 8.8  RBC 4.22 - 5.81 MIL/uL 4.86 4.97 4.71  HGB 13.0 - 17.0 g/dL 15.1 15.6 14.6  HCT 39.0 - 52.0 % 44.3 45.8 43.1  PLT 150 - 400 K/uL 274 295 268  NEUTROABS 1.7 - 7.7 K/uL - - -  LYMPHSABS 0.7 - 4.0 K/uL - - -     There is no height or weight on file to calculate BMI.  Orders:  No orders of the defined types were placed in this encounter.  No orders of the defined types were placed in this encounter.    Procedures: No procedures performed  Clinical Data: No additional findings.  ROS:  All other systems negative, except as noted in the HPI. Review of Systems  Objective: Vital Signs: There were no vitals taken for this visit.  Specialty Comments:  No specialty comments available.  PMFS History: Patient Active Problem List   Diagnosis Date Noted  . Essential hypertension 07/02/2020  .  Prediabetes 07/02/2020  . Polyuria 07/02/2020  . Nontraumatic complete tear of right rotator cuff   . Impingement syndrome of right shoulder   . Neuropathy 12/05/2019  . 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[place]) 03/07/2019  . Seizures ([place]) 01/31/2019  . Meningioma, recurrent of brain ([place]) 01/31/2019  . Meningioma ([place]) 01/03/2019  . Diffuse idiopathic skeletal hyperostosis 06/05/2013   Past Medical History:  Diagnosis Date  . Anxiety   . Brain tumor ([place]) 02/20/2013   brain tumor removed in March 2014, Dr Donald Pore  . Diffuse idiopathic skeletal hyperostosis 06/05/2013  . Dizziness   . Enlarged prostate   . GERD (gastroesophageal reflux disease)   . Headache(784.0)    scattered  . Hypertension   . 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[place])  03/07/2019  . Meningioma ([place])   . Meningioma, recurrent of brain ([place]) 01/31/2019  . Neuropathy 12/05/2019  . Rotator cuff tear    right  . Seizures ([place])    11/27/19 last sz 1 wk ago    Family History  Problem Relation Age of Onset  . Hypertension Mother   . Dementia Mother   . Diabetes Mother   . Hypertension Father   . CAD Father   . Diabetes Father   . CAD Brother     Past Surgical History:  Procedure Laterality Date  . APPLICATION OF CRANIAL NAVIGATION N/A 01/10/2019   Procedure: APPLICATION OF CRANIAL NAVIGATION;  Surgeon: Erline Levine, MD;  Location: [facility];  Service: Neurosurgery;  Laterality: N/A;  . CATARACT EXTRACTION Right   . CRANIOTOMY Right 11/06/2012   Procedure: CRANIOTOMY TUMOR EXCISION;  Surgeon: Erline Levine, MD;  Location: [facility];  Service: Neurosurgery;  Laterality: Right;  Right Parasagittal craniotomy for meningioma with Stealth  . CRANIOTOMY Right 01/10/2019   Procedure: Right Parasagittal Craniotomy for Tumor;  Surgeon: Erline Levine, MD;  Location: [facility];  Service: Neurosurgery;  Laterality: Right;  Right parasagittal craniotomy for tumor  . ESOPHAGOGASTRODUODENOSCOPY    . right knee arthroscopy    . SHOULDER ARTHROSCOPY Right 06/09/2020   Procedure: RIGHT SHOULDER ARTHROSCOPY AND DEBRIDEMENT;  Surgeon: Newt Minion, MD;  Location: [facility];  Service: Orthopedics;  Laterality: Right;   Social History   Occupational History  . Not on file  Tobacco Use  . Smoking status: Current Some Day Smoker    Types: Cigarettes    Last attempt to quit: 01/29/2019    Years since quitting: 1.5  . Smokeless tobacco: Never Used  . Tobacco comment: weekend smoker  Vaping Use  . Vaping Use: Never used  Substance and Sexual Activity  . Alcohol use: Yes    Comment: social  . Drug use: No  . Sexual activity: Not Currently

## 2020-08-28 NOTE — Progress Notes (Signed)
Pt states it's more like something is stuck and it has been going on for a couple of months

## 2020-08-28 NOTE — Patient Instructions (Signed)
I think you are having reflux with some swallowing difficulty.  I would like you to use Protonix twice daily before meals.  I think you can skip the sucralfate for now.  I would like to see you back in 1 month.  If you are still having issues at that point, we may do a swallowing x-ray, and perhaps a referral for flexible laryngoscopy (telescope exam).

## 2020-08-31 ENCOUNTER — Telehealth: Payer: Self-pay | Admitting: Family Medicine

## 2020-08-31 ENCOUNTER — Ambulatory Visit: Payer: Medicaid Other

## 2020-08-31 NOTE — Telephone Encounter (Signed)
error 

## 2020-09-01 ENCOUNTER — Encounter: Payer: Medicaid Other | Admitting: Physical Therapy

## 2020-09-02 ENCOUNTER — Other Ambulatory Visit: Payer: Self-pay

## 2020-09-02 ENCOUNTER — Ambulatory Visit (INDEPENDENT_AMBULATORY_CARE_PROVIDER_SITE_OTHER): Payer: Medicaid Other | Admitting: Physician Assistant

## 2020-09-02 ENCOUNTER — Encounter: Payer: Self-pay | Admitting: Physician Assistant

## 2020-09-02 ENCOUNTER — Ambulatory Visit: Payer: Medicaid Other

## 2020-09-02 DIAGNOSIS — M7541 Impingement syndrome of right shoulder: Secondary | ICD-10-CM | POA: Diagnosis not present

## 2020-09-02 DIAGNOSIS — M6281 Muscle weakness (generalized): Secondary | ICD-10-CM | POA: Diagnosis not present

## 2020-09-02 DIAGNOSIS — G8929 Other chronic pain: Secondary | ICD-10-CM | POA: Diagnosis not present

## 2020-09-02 DIAGNOSIS — M25511 Pain in right shoulder: Secondary | ICD-10-CM | POA: Diagnosis not present

## 2020-09-02 MED ORDER — METHYLPREDNISOLONE ACETATE 40 MG/ML IJ SUSP
40.0000 mg | INTRAMUSCULAR | Status: AC | PRN
  Administered 2020-09-02: 40 mg via INTRA_ARTICULAR

## 2020-09-02 MED ORDER — LIDOCAINE HCL 1 % IJ SOLN
5.0000 mL | INTRAMUSCULAR | Status: AC | PRN
  Administered 2020-09-02: 5 mL

## 2020-09-02 MED ORDER — HYDROCODONE-ACETAMINOPHEN 5-325 MG PO TABS: 1.0000 | ORAL_TABLET | Freq: Three times a day (TID) | ORAL | 0 refills | Status: DC | PRN

## 2020-09-02 NOTE — Therapy (Signed)
[facility] [address] [place], [place], [address] Phone: [phone]   Fax:  [phone]  Physical Therapy Treatment  Patient Details  Name: Joshua [name] MRN: [id number] Date of Birth: August 22, 1960 Referring Provider (PT): Suzan Slick, NP   Encounter Date: 09/02/2020   PT End of Session - 09/02/20 0944    Visit Number 12    Number of Visits 13    Date for PT Re-Evaluation 09/03/20    Authorization Type Medicaid approved 08/26/20-10/06/20 (12 visits)    Authorization - Visit Number 3    Authorization - Number of Visits 12    PT Start Time 0940    PT Stop Time 1020    PT Time Calculation (min) 40 min    Activity Tolerance Patient tolerated treatment well;Patient limited by pain    Behavior During Therapy [facility] for tasks assessed/performed           Past Medical History:  Diagnosis Date  . Anxiety   . Brain tumor ([place]) 02/20/2013   brain tumor removed in March 2014, Dr Donald Pore  . Diffuse idiopathic skeletal hyperostosis 06/05/2013  . Dizziness   . Enlarged prostate   . GERD (gastroesophageal reflux disease)   . Headache(784.0)    scattered  . Hypertension   . 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[place]) 03/07/2019  . Meningioma ([place])   . Meningioma, recurrent of brain ([place]) 01/31/2019  . Neuropathy 12/05/2019  . Rotator cuff tear    right  . Seizures ([place])    11/27/19 last sz 1 wk ago    Past Surgical History:  Procedure Laterality Date  . APPLICATION OF CRANIAL NAVIGATION N/A 01/10/2019   Procedure: APPLICATION OF CRANIAL NAVIGATION;  Surgeon: Erline Levine, MD;  Location: [facility];  Service: Neurosurgery;  Laterality: N/A;  . CATARACT EXTRACTION Right   . CRANIOTOMY Right 11/06/2012   Procedure: CRANIOTOMY TUMOR EXCISION;  Surgeon: Erline Levine, MD;  Location: [facility];  Service: Neurosurgery;  Laterality: Right;  Right Parasagittal craniotomy for meningioma with Stealth  . CRANIOTOMY Right 01/10/2019    Procedure: Right Parasagittal Craniotomy for Tumor;  Surgeon: Erline Levine, MD;  Location: [facility];  Service: Neurosurgery;  Laterality: Right;  Right parasagittal craniotomy for tumor  . ESOPHAGOGASTRODUODENOSCOPY    . right knee arthroscopy    . SHOULDER ARTHROSCOPY Right 06/09/2020   Procedure: RIGHT SHOULDER ARTHROSCOPY AND DEBRIDEMENT;  Surgeon: Newt Minion, MD;  Location: [facility];  Service: Orthopedics;  Laterality: Right;    There were no vitals filed for this visit.   Subjective Assessment - 09/02/20 0941    Subjective Pt reports that he has been in a lot of pain for about a week.  Patient reports 9/10 pain upon arrival today.  Shoulder has been 10/10 pain for the last couple of days and isn't getting better with pain meds.  Has been staying in the bed for a couple of days.  Pt reports that he has an appointment today to get an injection and to get a referral to an ortho surgeon for total shoulder replacement.    Limitations Lifting    How long can you sit comfortably? unlimited    How long can you stand comfortably? limited due to use of SPC in RUE 20 min    How long can you walk comfortably? limited due to use of SPC in RUE - 20 min    Diagnostic tests CT 10/15   IMPRESSION:  Complete supraspinatus and infraspinatus tendon tears with  retraction to the glenohumeral joint. Mild to moderate atrophy is  worse in the infraspinatus. Edema in the muscle bellies secondary to  atrophy is also worse in the infraspinatus.     Severe long head of biceps tendinopathy. The intra-articular segment  is markedly attenuated consistent with partial tear.     Subscapularis tendinopathy without tear.     Mild acromioclavicular and glenohumeral osteoarthritis.     Subacromial/subdeltoid bursitis.     No acute bony abnormality.    Patient Stated Goals to decrease pain.    Currently in Pain? Yes    Pain Score 9     Pain Location Shoulder    Pain Orientation Right    Pain Descriptors /  Indicators Sharp    Pain Type Chronic pain    Pain Onset More than a month ago    Pain Frequency Constant            TREATMENT   Ther-ex  UBE x 8 minutes for warm-up during history (8 minutes forward); Isometric presses against yellow ball, elbow flexed to 90 degrees, forward punch, ER and IR, 5s hold x10 each direction      Manual Therapy  All manual therapy done with MHP on R shoulder to help with pain R long axis GHJ distraction, neutral arm position, 3 bouts of 30s, pt reports some relief with distraction in neutral arm  Very gentle PROM R shoulder flexion, abduction, internal and external rotation, patient flexion much more limited, all ranges limited by pain; R GH A/P mobilizations at neutral, grade I/II, 30s/bout x 3 bouts, also attempted at 90 flexion but  R GH A/P mobilizations at 90 abduction and available end range ER, grade I/II, 30s/bout x 3 bouts;     Pt educated throughout session about proper posture and technique with exercises. Improved exercise technique, movement at target joints, use of target muscles after min to mod verbal, visual, tactile cues.    Pt arrived late to session so treatment was abbreviated accordingly.  Pt arrived in a lot of pain (9/10), which limited the there-ex done today.   Manual therapy was completed within tolerable ranges as not to increase pain.  To help decrease pain, the patient was given a MHP during manual therapy.  He struggled some with isometric ball presses on the wall and required constant cuing to not press into too much pain.  Visual and tactile cues were provided throughout exercise to assure correct form and pain management.  Pt reported that his pain was improving with start of PT but a week ago he's not sure why it started getting a lot worse again.  He is getting an injection today and following up with specialists for continued POC.  Marland Kitchen Pt will benefit from PT services to address deficits in R shoulder range of motion and  strength in order to return to full function at home.                              PT Short Term Goals - 08/24/20 1353      PT SHORT TERM GOAL #1   Title pt to be IND with inital HEP    Baseline he reports compliance with HEP    Time 3    Period Weeks    Status Achieved    Target Date 07/30/20      PT SHORT TERM GOAL #2  Title pt to verbalize techniques to reduce R shoulder pain via RICE and HEP    Baseline Pt verbalizes use of HEP and rest for pain reduction    Time 3    Period Weeks    Status Achieved      PT SHORT TERM GOAL #3   Title pt to increaes R shoulder PROM/ AAROM flexion by >/= 15 degrees to promote functional mobility    Time 3    Period Weeks    Status Achieved             PT Long Term Goals - 08/24/20 1354      PT LONG TERM GOAL #1   Title increase R shoulder AROM flexion/ abduction to >/= 120 degrees and IR/ER with functional limits compard bil with </= 2/10 max pain    Baseline AROM improving; 08/24/20: flexion: 155, abduction: 135, HBB: L1 HBH: back of neck    Time 6    Period Weeks    Status On-going    Target Date 09/03/20      PT LONG TERM GOAL #2   Title pt to increase R shoulder strength to >/= 4/5 to promote stability and  functional lifting/ carrying activities    Baseline unable to test strength due to irritabiity at evaluation; 08/24/20: R shoulder (out of 5): flexion: 2+/5, abduction: 2+/5, IR: strong (sitting), ER: grossly weak (sitting);    Time 6    Period Weeks    Status On-going    Target Date 09/03/20      PT LONG TERM GOAL #3   Title pt to be able to lift/ lower >/= 8# to and from and overhead shelf carry >/= 10# in RUE with </= 2/10 max pain for functional strength    Baseline Pt reports his HEP has been going well.; 08/24/20: still unabel to lift to/from overhead shelf;    Time 6    Period Weeks    Status On-going    Target Date 09/03/20      PT LONG TERM GOAL #4   Title pt to be IND with all HEP given  and is able to maintain and progress her current LOF IND.    Baseline progressing HEP; 08/24/20: progressing HEP    Time 6    Period Weeks    Status On-going    Target Date 09/03/20                 Plan - 09/02/20 0947    Clinical Impression Statement Pt arrived late to session so treatment was abbreviated accordingly.  Pt arrived in a lot of pain (9/10), which limited the ther-ex done today.   Manual therapy was completed within tolerable ranges as not to increase pain.  To help decrease pain, the patient was given a moist heat pack during manual therapy.  He struggled some with isometric ball presses on the wall and required constant cuing to not press into too much pain.  Visual and tactile cues were provided throughout exercise to assure correct form and pain management.  Pt reported that his pain was improving with start of PT but a week ago he's not sure why it started getting a lot worse again.  He is getting an injection today and following up with specialists for continued POC. Pt will benefit from PT services to address deficits in R shoulder range of motion and strength in order to return to full function at home.    Personal Factors and Comorbidities  Comorbidity 3+    Examination-Activity Limitations Stand;Lift;Locomotion Level;Carry;Dressing;Hygiene/Grooming    Examination-Participation Restrictions Community Activity;Interpersonal Relationship    PT Frequency 2x / week    PT Duration 6 weeks    PT Treatment/Interventions ADLs/Self Care Home Management;Cryotherapy;Gait training;Stair training;Therapeutic activities;Therapeutic exercise;Neuromuscular re-education;Patient/family education;Manual techniques;Dry needling;Taping;Vasopneumatic Device    PT Next Visit Plan update HEP. Continue shoulder mobs, and  PROM/AAROM. progress shoulder strengthening as tolerated. scapular setting, heat OK (Hx of meningioma)    PT Home Exercise Plan [id number] - pendulums, Shoulder wand flexion/  abduction, scapular retraction, upper trap stretch, table slides, ER and Row bANDS    Consulted and Agree with Plan of Care Patient           Patient will benefit from skilled therapeutic intervention in order to improve the following deficits and impairments:  Improper body mechanics,Increased muscle spasms,Decreased strength,Postural dysfunction,Pain,Decreased activity tolerance,Decreased endurance,Increased edema  Visit Diagnosis: Chronic right shoulder pain  Muscle weakness (generalized)     Problem List Patient Active Problem List   Diagnosis Date Noted  . Essential hypertension 07/02/2020  . Prediabetes 07/02/2020  . Polyuria 07/02/2020  . Nontraumatic complete tear of right rotator cuff   . Impingement syndrome of right shoulder   . Neuropathy 12/05/2019  . 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[place]) 03/07/2019  . Seizures ([place]) 01/31/2019  . Meningioma, recurrent of brain ([place]) 01/31/2019  . Meningioma ([place]) 01/03/2019  . Diffuse idiopathic skeletal hyperostosis 06/05/2013    This entire session was performed under direct supervision and direction of a licensed therapist/therapist assistant . I have personally read, edited and approve of the note as written.   Rosalee Kaufman, SPT [facility] PT, DPT, GCS  Huprich,Jason 09/02/2020, 12:54 PM   [facility] [address] [place], [place], [address] Phone: [phone]   Fax:  [phone]  Name: YONAS BUNDA MRN: [id number] Date of Birth: [birth year]

## 2020-09-02 NOTE — Progress Notes (Signed)
Office Visit Note   Patient: Joshua [name]           Date of Birth: 1960-10-02           MRN: [id number] Visit Date: 09/02/2020              Requested by: Charlott Rakes, MD [place],  [place] [address] PCP: Charlott Rakes, MD  No chief complaint on file.     HPI: Patient is a 60 year old gentleman who is almost 3 months status post right shoulder arthroscopy and debridement.  At the time of surgery he had massive tearing of the rotator cuff with retraction.  He has had difficulty improving since surgery.  He does not feel like he has improved since surgery and has significant painful range of motion and weakness.  He did try a course of physical therapy but was unable to complete it secondary to pain.  He also cannot sleep at night secondary to the pain and discomfort he is wondering if he could try another injection today.  We had also mentioned in the past consultation with Dr. Marlou Sa for further options and he would like to do this today  Assessment & Plan: Visit Diagnoses:  1. Impingement syndrome of right shoulder     Plan: I did place a referral for chronic pain management.  I did give him 30 hydrocodone which he understands he is only to take at night when he cannot sleep.  During the day he has meloxicam.  We will go forward with an injection today  Follow-Up Instructions: No follow-ups on file.   Ortho Exam  Patient is alert, oriented, no adenopathy, well-dressed, normal affect, normal respiratory effort. Right shoulder well-healed surgical portals no cellulitis skin is in good condition he has very limited forward elevation internal rotation and weakness secondary to pain.  Imaging: No results found. No images are attached to the encounter.  Labs: Lab Results  Component Value Date   HGBA1C 6.2 (A) 07/01/2020   HGBA1C 6.3 (H) 04/15/2020   HGBA1C 6.2 (H) 12/03/2019   REPTSTATUS 02/28/2020 FINAL 02/27/2020   CULT MULTIPLE SPECIES PRESENT,  SUGGEST RECOLLECTION (A) 02/27/2020     Lab Results  Component Value Date   ALBUMIN 4.2 04/25/2020   ALBUMIN 4.7 04/15/2020   ALBUMIN 4.3 03/10/2020    Lab Results  Component Value Date   MG 1.8 07/09/2019   No results found for: VD25OH  No results found for: PREALBUMIN CBC EXTENDED Latest Ref Rng & Units 07/18/2020 06/21/2020 06/11/2020  WBC 4.0 - 10.5 K/uL 8.4 8.7 8.8  RBC 4.22 - 5.81 MIL/uL 4.86 4.97 4.71  HGB 13.0 - 17.0 g/dL 15.1 15.6 14.6  HCT 39.0 - 52.0 % 44.3 45.8 43.1  PLT 150 - 400 K/uL 274 295 268  NEUTROABS 1.7 - 7.7 K/uL - - -  LYMPHSABS 0.7 - 4.0 K/uL - - -     There is no height or weight on file to calculate BMI.  Orders:  Orders Placed This Encounter  Procedures  . Ambulatory referral to Pain Clinic   Meds ordered this encounter  Medications  . HYDROcodone-acetaminophen (NORCO/VICODIN) 5-325 MG tablet    Sig: Take 1 tablet by mouth every 8 (eight) hours as needed for moderate pain.    Dispense:  30 tablet    Refill:  0     Procedures: Large Joint Inj on 09/02/2020 2:33 PM Indications: diagnostic evaluation and pain Details: 22 G 1.5 in needle, posterior approach  Arthrogram: No  Medications: 5 mL lidocaine 1 %; 40 mg methylPREDNISolone acetate 40 MG/ML Outcome: tolerated well, no immediate complications Procedure, treatment alternatives, risks and benefits explained, specific risks discussed. Consent was given by the patient. Immediately prior to procedure a time out was called to verify the correct patient, procedure, equipment, support staff and site/side marked as required. Patient was prepped and draped in the usual sterile fashion.      Clinical Data: No additional findings.  ROS:  All other systems negative, except as noted in the HPI. Review of Systems  Objective: Vital Signs: There were no vitals taken for this visit.  Specialty Comments:  No specialty comments available.  PMFS History: Patient Active Problem List    Diagnosis Date Noted  . Essential hypertension 07/02/2020  . Prediabetes 07/02/2020  . Polyuria 07/02/2020  . Nontraumatic complete tear of right rotator cuff   . Impingement syndrome of right shoulder   . Neuropathy 12/05/2019  . 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 Seizures ([place]) 01/31/2019  . Meningioma, recurrent of brain ([place]) 01/31/2019  . Meningioma ([place]) 01/03/2019  . Diffuse idiopathic skeletal hyperostosis 06/05/2013   Past Medical History:  Diagnosis Date  . Anxiety   . Brain tumor ([place]) 02/20/2013   brain tumor removed in March 2014, Dr Donald Pore  . Diffuse idiopathic skeletal hyperostosis 06/05/2013  . Dizziness   . Enlarged prostate   . GERD (gastroesophageal reflux disease)   . Headache(784.0)    scattered  . Hypertension   . 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[place]) 03/07/2019  . Meningioma ([place])   . Meningioma, recurrent of brain ([place]) 01/31/2019  . Neuropathy 12/05/2019  . Rotator cuff tear    right  . Seizures ([place])    11/27/19 last sz 1 wk ago    Family History  Problem Relation Age of Onset  . Hypertension Mother   . Dementia Mother   . Diabetes Mother   . Hypertension Father   . CAD Father   . Diabetes Father   . CAD Brother     Past Surgical History:  Procedure Laterality Date  . APPLICATION OF CRANIAL NAVIGATION N/A 01/10/2019   Procedure: APPLICATION OF CRANIAL NAVIGATION;  Surgeon: Erline Levine, MD;  Location: [facility];  Service: Neurosurgery;  Laterality: N/A;  . CATARACT EXTRACTION Right   . CRANIOTOMY Right 11/06/2012   Procedure: CRANIOTOMY TUMOR EXCISION;  Surgeon: Erline Levine, MD;  Location: [facility];  Service: Neurosurgery;  Laterality: Right;  Right Parasagittal craniotomy for meningioma with Stealth  . CRANIOTOMY Right 01/10/2019   Procedure: Right Parasagittal Craniotomy for Tumor;  Surgeon: Erline Levine, MD;  Location: [facility];  Service: Neurosurgery;  Laterality: Right;  Right parasagittal craniotomy for  tumor  . ESOPHAGOGASTRODUODENOSCOPY    . right knee arthroscopy    . SHOULDER ARTHROSCOPY Right 06/09/2020   Procedure: RIGHT SHOULDER ARTHROSCOPY AND DEBRIDEMENT;  Surgeon: Newt Minion, MD;  Location: [facility];  Service: Orthopedics;  Laterality: Right;   Social History   Occupational History  . Not on file  Tobacco Use  . Smoking status: Current Some Day Smoker    Types: Cigarettes    Last attempt to quit: 01/29/2019    Years since quitting: 1.5  . Smokeless tobacco: Never Used  . Tobacco comment: weekend smoker  Vaping Use  . Vaping Use: Never used  Substance and Sexual Activity  . Alcohol use: Yes    Comment: social  . Drug use:  No  . Sexual activity: Not Currently

## 2020-09-03 ENCOUNTER — Encounter: Payer: Medicaid Other | Admitting: Physical Therapy

## 2020-09-04 ENCOUNTER — Ambulatory Visit: Payer: Medicaid Other

## 2020-09-04 ENCOUNTER — Encounter: Payer: Self-pay | Admitting: Physical Medicine & Rehabilitation

## 2020-09-04 ENCOUNTER — Telehealth: Payer: Self-pay | Admitting: Family Medicine

## 2020-09-04 NOTE — Telephone Encounter (Signed)
Copied from [place] [id number]. Topic: General - Other >> Sep 04, 2020 11:06 AM Tessa Lerner A wrote: Reason for CRM: Patient has been prescribed metFORMIN (GLUCOPHAGE) 500 MG and has questions in relation to elevated blood sugar that they believe is a result of the medication.  Patient would like to be contacted by a member of clinical staff to discuss further.  Please contact to advise.

## 2020-09-04 NOTE — Telephone Encounter (Signed)
Pt was called and blood sugars numbers were discussed and pt was informed that he has an upcoming appointment on 2/22 and this matter will be discussed further then.

## 2020-09-06 ENCOUNTER — Other Ambulatory Visit: Payer: Self-pay

## 2020-09-06 ENCOUNTER — Emergency Department: Payer: Medicaid Other

## 2020-09-06 ENCOUNTER — Encounter: Payer: Self-pay | Admitting: Intensive Care

## 2020-09-06 ENCOUNTER — Emergency Department
Admission: EM | Admit: 2020-09-06 | Discharge: 2020-09-06 | Disposition: A | Discharge status: Home / Self Care | Payer: Medicaid Other | Attending: Student in an Organized Health Care Education/Training Program | Admitting: Student in an Organized Health Care Education/Training Program

## 2020-09-06 DIAGNOSIS — Z7984 Long term (current) use of oral hypoglycemic drugs: Secondary | ICD-10-CM | POA: Diagnosis not present

## 2020-09-06 DIAGNOSIS — I10 Essential (primary) hypertension: Secondary | ICD-10-CM | POA: Insufficient documentation

## 2020-09-06 DIAGNOSIS — Z7982 Long term (current) use of aspirin: Secondary | ICD-10-CM | POA: Insufficient documentation

## 2020-09-06 DIAGNOSIS — R0789 Other chest pain: Secondary | ICD-10-CM | POA: Diagnosis not present

## 2020-09-06 DIAGNOSIS — R079 Chest pain, unspecified: Secondary | ICD-10-CM | POA: Diagnosis not present

## 2020-09-06 DIAGNOSIS — Z79899 Other long term (current) drug therapy: Secondary | ICD-10-CM | POA: Diagnosis not present

## 2020-09-06 DIAGNOSIS — Z87891 Personal history of nicotine dependence: Secondary | ICD-10-CM | POA: Diagnosis not present

## 2020-09-06 DIAGNOSIS — Z20822 Contact with and (suspected) exposure to covid-19: Secondary | ICD-10-CM | POA: Insufficient documentation

## 2020-09-06 DIAGNOSIS — Z86011 Personal history of benign neoplasm of the brain: Secondary | ICD-10-CM | POA: Insufficient documentation

## 2020-09-06 DIAGNOSIS — R0602 Shortness of breath: Secondary | ICD-10-CM | POA: Diagnosis not present

## 2020-09-06 HISTORY — DX: Pure hypercholesterolemia, unspecified: E78.00

## 2020-09-06 LAB — BASIC METABOLIC PANEL
Anion gap: 11 (ref 5–15)
BUN: 13 mg/dL (ref 6–20)
CO2: 25 mmol/L (ref 22–32)
Calcium: 9.2 mg/dL (ref 8.9–10.3)
Chloride: 103 mmol/L (ref 98–111)
Creatinine, Ser: 0.83 mg/dL (ref 0.61–1.24)
GFR, Estimated: >60 mL/min (ref >60)
Glucose, Bld: 111 mg/dL — ABNORMAL HIGH (ref 70–99)
Potassium: 4 mmol/L (ref 3.5–5.1)
Sodium: 139 mmol/L (ref 135–145)

## 2020-09-06 LAB — TROPONIN I (HIGH SENSITIVITY)
Troponin I (High Sensitivity): 4 ng/L (ref <18)
Troponin I (High Sensitivity): 7 ng/L (ref <18)

## 2020-09-06 LAB — D-DIMER, QUANTITATIVE: D-Dimer, Quant: 2.69 ug/mL-FEU — ABNORMAL HIGH (ref 0.00–0.50)

## 2020-09-06 LAB — SARS CORONAVIRUS 2 (TAT 6-24 HRS): SARS Coronavirus 2: NEGATIVE

## 2020-09-06 LAB — CBC
HCT: 44.5 % (ref 39.0–52.0)
Hemoglobin: 15 g/dL (ref 13.0–17.0)
MCH: 30.5 pg (ref 26.0–34.0)
MCHC: 33.7 g/dL (ref 30.0–36.0)
MCV: 90.6 fL (ref 80.0–100.0)
Platelets: 297 10*3/uL (ref 150–400)
RBC: 4.91 MIL/uL (ref 4.22–5.81)
RDW: 12 % (ref 11.5–15.5)
WBC: 7.8 10*3/uL (ref 4.0–10.5)
nRBC: 0 % (ref 0.0–0.2)

## 2020-09-06 LAB — PROTIME-INR
INR: 1.1 (ref 0.8–1.2)
Prothrombin Time: 13.3 seconds (ref 11.4–15.2)

## 2020-09-06 MED ORDER — ALBUTEROL SULFATE HFA 108 (90 BASE) MCG/ACT IN AERS: 2.0000 | INHALATION_SPRAY | Freq: Four times a day (QID) | RESPIRATORY_TRACT | 2 refills | Status: DC | PRN

## 2020-09-06 MED ORDER — IPRATROPIUM-ALBUTEROL 0.5-2.5 (3) MG/3ML IN SOLN
3.0000 mL | Freq: Once | RESPIRATORY_TRACT | Status: AC
  Administered 2020-09-06: 3 mL via RESPIRATORY_TRACT
  Filled 2020-09-06: qty 3

## 2020-09-06 MED ORDER — OXYCODONE-ACETAMINOPHEN 5-325 MG PO TABS
1.0000 | ORAL_TABLET | Freq: Once | ORAL | Status: AC
Start: 2020-09-06 — End: 2020-09-06
  Administered 2020-09-06: 1 via ORAL
  Filled 2020-09-06: qty 1

## 2020-09-06 MED ORDER — IOHEXOL 350 MG/ML SOLN
75.0000 mL | Freq: Once | INTRAVENOUS | Status: AC | PRN
  Administered 2020-09-06: 75 mL via INTRAVENOUS

## 2020-09-06 NOTE — ED Triage Notes (Signed)
Patient c/o central sharp chest pain with SOB when awakening this AM. Denies radiation.

## 2020-09-06 NOTE — ED Provider Notes (Signed)
[facility] Emergency Department Provider Note    Event Date/Time   First MD Initiated Contact with Patient 09/06/20 1129     (approximate)  I have reviewed the triage vital signs and the nursing notes.   HISTORY  Chief Complaint Chest Pain    HPI Joshua [name] is a 60 y.o. male with the below listed past medical history presents to the ER for evaluation of chest pain and discomfort when taking deep breath.  Denies any fevers or chills.  No diaphoresis.  Denies any worsening of symptoms with ambulation.  No change in symptoms with position.  Denies any recent trauma.  Had recent stress test that was low risk.  Denies any abdominal pain.  No pain radiating through to his back.  No lower extremity swelling.    Past Medical History:  Diagnosis Date  . Anxiety   . Brain tumor ([place]) 02/20/2013   brain tumor removed in March 2014, Dr Donald Pore  . Diffuse idiopathic skeletal hyperostosis 06/05/2013  . Dizziness   . Enlarged prostate   . GERD (gastroesophageal reflux disease)   . Headache(784.0)    scattered  . High cholesterol   . Hypertension   . 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[place]) 03/07/2019  . Meningioma ([place])   . Meningioma, recurrent of brain ([place]) 01/31/2019  . Neuropathy 12/05/2019  . Rotator cuff tear    right  . Seizures ([place])    11/27/19 last sz 1 wk ago   Family History  Problem Relation Age of Onset  . Hypertension Mother   . Dementia Mother   . Diabetes Mother   . Hypertension Father   . CAD Father   . Diabetes Father   . CAD Brother    Past Surgical History:  Procedure Laterality Date  . APPLICATION OF CRANIAL NAVIGATION N/A 01/10/2019   Procedure: APPLICATION OF CRANIAL NAVIGATION;  Surgeon: Erline Levine, MD;  Location: [facility];  Service: Neurosurgery;  Laterality: N/A;  . CATARACT EXTRACTION Right   . CRANIOTOMY Right 11/06/2012   Procedure: CRANIOTOMY TUMOR EXCISION;  Surgeon: Erline Levine, MD;  Location: [facility];  Service: Neurosurgery;  Laterality: Right;  Right Parasagittal craniotomy for meningioma with Stealth  . CRANIOTOMY Right 01/10/2019   Procedure: Right Parasagittal Craniotomy for Tumor;  Surgeon: Erline Levine, MD;  Location: [facility];  Service: Neurosurgery;  Laterality: Right;  Right parasagittal craniotomy for tumor  . ESOPHAGOGASTRODUODENOSCOPY    . right knee arthroscopy    . SHOULDER ARTHROSCOPY Right 06/09/2020   Procedure: RIGHT SHOULDER ARTHROSCOPY AND DEBRIDEMENT;  Surgeon: Newt Minion, MD;  Location: [facility];  Service: Orthopedics;  Laterality: Right;   Patient Active Problem List   Diagnosis Date Noted  . Essential hypertension 07/02/2020  . Prediabetes 07/02/2020  . Polyuria 07/02/2020  . Nontraumatic complete tear of right rotator cuff   . Impingement syndrome of right shoulder   . Neuropathy 12/05/2019  . 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[place]) 03/07/2019  . Seizures ([place]) 01/31/2019  . Meningioma, recurrent of brain ([place]) 01/31/2019  . Meningioma ([place]) 01/03/2019  . Diffuse idiopathic skeletal hyperostosis 06/05/2013      Prior to Admission medications   Medication Sig Start Date End Date Taking? Authorizing Provider  albuterol (VENTOLIN HFA) 108 (90 Base) MCG/ACT inhaler Inhale 2 puffs into the lungs every 6 (six) hours as needed for wheezing or shortness of breath. 09/06/20  Yes Merlyn Lot, MD  Accu-Chek Softclix Lancets lancets Use  as instructed 07/08/20   Mayers, Cari S, PA-C  amLODipine (NORVASC) 5 MG tablet Take 1 tablet (5 mg total) by mouth daily. To lower blood pressure 05/06/20   Fulp, Cammie, MD  aspirin 81 MG chewable tablet     [provider]  atorvastatin (LIPITOR) 20 MG tablet TAKE 1 TABLET BY MOUTH EVERY DAY 08/27/20   Charlott Rakes, MD  clonazePAM (KLONOPIN) 0.5 MG tablet TAKE 1 TABLET BY MOUTH TWICE A DAY 08/25/20   Vaslow, Acey Lav, MD  glucose blood (ACCU-CHEK AVIVA PLUS) test strip Use as instructed  07/08/20   Mayers, Cari S, PA-C  HYDROcodone-acetaminophen (NORCO/VICODIN) 5-325 MG tablet Take 1 tablet by mouth every 8 (eight) hours as needed for moderate pain. 09/02/20   Persons, Bevely Palmer, PA  lamoTRIgine (LAMICTAL) 100 MG tablet TAKE 2 TABLETS BY MOUTH 2 TIMES DAILY. 08/25/20   [name], MD  metFORMIN (GLUCOPHAGE) 500 MG tablet Take 1 tablet (500 mg total) by mouth 2 (two) times daily with a meal. 07/01/20   Mayers, Cari S, PA-C  methocarbamol (ROBAXIN) 500 MG tablet Take 1 tablet (500 mg total) by mouth every 8 (eight) hours as needed for muscle spasms. 04/22/20   Davonna Belling, MD  Misc. Devices MISC Please provide BP cuff for patient 07/08/20   Mathis Dad  omeprazole (PRILOSEC) 20 MG capsule One pill twice daily- morning and before bedtime to reduce stomach acid 04/15/20   Fulp, Cammie, MD  ondansetron (ZOFRAN) 4 MG tablet Take 1 tablet (4 mg total) by mouth every 8 (eight) hours as needed for nausea or vomiting. 07/31/20   Swords, Darrick Penna, MD  pantoprazole (PROTONIX) 20 MG tablet Take 1 tablet (20 mg total) by mouth 2 (two) times daily before a meal. 7/82/95 01/05/29  Jodi Marble, MD  tamsulosin (FLOMAX) 0.4 MG CAPS capsule Take 1 capsule (0.4 mg total) by mouth daily. 07/23/20   Billey Co, MD  VIMPAT 100 MG TABS TAKE 2 TABLETS IN MORNING AND 2 AT BEDTIME 05/11/20   Vaslow, Acey Lav, MD    Allergies Patient has no known allergies.    Social History Social History   Tobacco Use  . Smoking status: Former Smoker    Types: Cigarettes    Quit date: 01/29/2019    Years since quitting: 1.6  . Smokeless tobacco: Never Used  . Tobacco comment: weekend smoker  Vaping Use  . Vaping Use: Never used  Substance Use Topics  . Alcohol use: Yes    Comment: social  . Drug use: No    Review of Systems Patient denies headaches, rhinorrhea, blurry vision, numbness, shortness of breath, chest pain, edema, cough, abdominal pain, nausea, vomiting, diarrhea,  dysuria, fevers, rashes or hallucinations unless otherwise stated above in HPI. ____________________________________________   PHYSICAL EXAM:  VITAL SIGNS: Vitals:   09/06/20 1230 09/06/20 1349  BP: 115/76 117/77  Pulse: (!) 56 60  Resp: 14 14  Temp:    SpO2: 96% 97%    Constitutional: Alert and oriented.  Eyes: Conjunctivae are normal.  Head: Atraumatic. Nose: No congestion/rhinnorhea. Mouth/Throat: Mucous membranes are moist.   Neck: No stridor. Painless ROM.  Cardiovascular: Normal rate, regular rhythm. Grossly normal heart sounds.  Good peripheral circulation. Respiratory: Normal respiratory effort.  No retractions. Lungs with coarse bibasilar breathsounds Gastrointestinal: Soft and nontender. No distention. No abdominal bruits. No CVA tenderness. Genitourinary:  Musculoskeletal: No lower extremity tenderness nor edema.  No joint effusions. Neurologic:  Normal speech and language. No gross  focal neurologic deficits are appreciated. No facial droop Skin:  Skin is warm, dry and intact. No rash noted. Psychiatric: Mood and affect are normal. Speech and behavior are normal.  ____________________________________________   LABS (all labs ordered are listed, but only abnormal results are displayed)  Results for orders placed or performed during the hospital encounter of 09/06/20 (from the past 24 hour(s))  Basic metabolic panel     Status: Abnormal   Collection Time: 09/06/20 10:10 AM  Result Value Ref Range   Sodium 139 135 - 145 mmol/L   Potassium 4.0 3.5 - 5.1 mmol/L   Chloride 103 98 - 111 mmol/L   CO2 25 22 - 32 mmol/L   Glucose, Bld 111 (H) 70 - 99 mg/dL   BUN 13 6 - 20 mg/dL   Creatinine, Ser 0.83 0.61 - 1.24 mg/dL   Calcium 9.2 8.9 - 10.3 mg/dL   GFR, Estimated >60 >60 mL/min   Anion gap 11 5 - 15  CBC     Status: None   Collection Time: 09/06/20 10:10 AM  Result Value Ref Range   WBC 7.8 4.0 - 10.5 K/uL   RBC 4.91 4.22 - 5.81 MIL/uL   Hemoglobin 15.0 13.0  - 17.0 g/dL   HCT 44.5 39.0 - 52.0 %   MCV 90.6 80.0 - 100.0 fL   MCH 30.5 26.0 - 34.0 pg   MCHC 33.7 30.0 - 36.0 g/dL   RDW 12.0 11.5 - 15.5 %   Platelets 297 150 - 400 K/uL   nRBC 0.0 0.0 - 0.2 %  Troponin I (High Sensitivity)     Status: None   Collection Time: 09/06/20 10:10 AM  Result Value Ref Range   Troponin I (High Sensitivity) 4 <18 ng/L  Protime-INR (order if Patient is taking Coumadin / Warfarin)     Status: None   Collection Time: 09/06/20 10:10 AM  Result Value Ref Range   Prothrombin Time 13.3 11.4 - 15.2 seconds   INR 1.1 0.8 - 1.2  D-dimer, quantitative     Status: Abnormal   Collection Time: 09/06/20 11:55 AM  Result Value Ref Range   D-Dimer, Quant 2.69 (H) 0.00 - 0.50 ug/mL-FEU  Troponin I (High Sensitivity)     Status: None   Collection Time: 09/06/20 12:08 PM  Result Value Ref Range   Troponin I (High Sensitivity) 7 <18 ng/L   ____________________________________________  EKG My review and personal interpretation at Time: 5:59   Indication: chest pain  Rate: 75  Rhythm: sinus Axis: rightward Other: normal intervals, no stemi, poor r wave progression ____________________________________________  RADIOLOGY  I personally reviewed all radiographic images ordered to evaluate for the above acute complaints and reviewed radiology reports and findings.  These findings were personally discussed with the patient.  Please see medical record for radiology report.  ____________________________________________   PROCEDURES  Procedure(s) performed:  Procedures    Critical Care performed: no ____________________________________________   INITIAL IMPRESSION / ASSESSMENT AND PLAN / ED COURSE  Pertinent labs & imaging results that were available during my care of the patient were reviewed by me and considered in my medical decision making (see chart for details).   DDX: ACS, pericarditis, esophagitis, boerhaaves, pe, dissection, pna, bronchitis,  costochondritis   HOYT LEANOS is a 60 y.o. who presents to the ED with symptoms as described above.  Patient well-appearing in no acute distress.  Abdominal exam soft and benign.  EKG without evidence of acute ischemia.  Initial troponin negative.  Will order D-dimer to further restratify for PE.  Will observe on telemetry monitor for serial enzymes.  Clinical Course as of 09/06/20 1418  Sun Sep 06, 2020  1343 Cardiac enzymes were negative.  CT angiogram was ordered as D-dimer was elevated.  CTA does not show any evidence of PE.  Is not having lower extremity swelling to suggest DVT.  Does have remote history of smoking.  Will try nebulizer to see if he has a component of bronchitis but otherwise a single noncardiac chest pain.  His abdominal exam is soft and benign. [PR]  1409 Patient did feel better after nebulizer.  States he is struggled with diabetes in the past and as he is otherwise well-appearing I do not feel that steroids are clinically indicated at this time.  Will give albuterol inhaler.  Will test for Covid but he is otherwise well-appearing I think is appropriate for outpatient follow-up.  Discussed strict return precautions and patient agreeable to plan. [PR]    Clinical Course User Index [PR] Merlyn Lot, MD      The patient was evaluated in Emergency Department today for the symptoms described in the history of present illness. He/she was evaluated in the context of the global COVID-19 pandemic, which necessitated consideration that the patient might be at risk for infection with the SARS-CoV-2 virus that causes COVID-19. Institutional protocols and algorithms that pertain to the evaluation of patients at risk for COVID-19 are in a state of rapid change based on information released by regulatory bodies including the CDC and federal and state organizations. These policies and algorithms were followed during the patient's care in the ED.  As part of my medical decision  making, I reviewed the following data within the [facility] notes reviewed and incorporated, Labs reviewed, notes from prior ED visits and [place] Controlled Substance Database   ____________________________________________   FINAL CLINICAL IMPRESSION(S) / ED DIAGNOSES  Final diagnoses:  Atypical chest pain      NEW MEDICATIONS STARTED DURING THIS VISIT:  New Prescriptions   ALBUTEROL (VENTOLIN HFA) 108 (90 BASE) MCG/ACT INHALER    Inhale 2 puffs into the lungs every 6 (six) hours as needed for wheezing or shortness of breath.     Note:  This document was prepared using Dragon voice recognition software and may include unintentional dictation errors.    Merlyn Lot, MD 09/06/20 [id number]

## 2020-09-07 ENCOUNTER — Other Ambulatory Visit: Payer: Self-pay

## 2020-09-07 ENCOUNTER — Ambulatory Visit: Payer: Medicaid Other

## 2020-09-07 ENCOUNTER — Telehealth: Payer: Self-pay | Admitting: *Deleted

## 2020-09-07 DIAGNOSIS — Z5321 Procedure and treatment not carried out due to patient leaving prior to being seen by health care provider: Secondary | ICD-10-CM | POA: Insufficient documentation

## 2020-09-07 DIAGNOSIS — R071 Chest pain on breathing: Secondary | ICD-10-CM | POA: Diagnosis not present

## 2020-09-07 LAB — CBC
HCT: 44 % (ref 39.0–52.0)
Hemoglobin: 14.5 g/dL (ref 13.0–17.0)
MCH: 30.1 pg (ref 26.0–34.0)
MCHC: 33 g/dL (ref 30.0–36.0)
MCV: 91.5 fL (ref 80.0–100.0)
Platelets: 310 10*3/uL (ref 150–400)
RBC: 4.81 MIL/uL (ref 4.22–5.81)
RDW: 12 % (ref 11.5–15.5)
WBC: 8.8 10*3/uL (ref 4.0–10.5)
nRBC: 0 % (ref 0.0–0.2)

## 2020-09-07 LAB — TROPONIN I (HIGH SENSITIVITY): Troponin I (High Sensitivity): 5 ng/L (ref <18)

## 2020-09-07 LAB — BASIC METABOLIC PANEL
Anion gap: 9 (ref 5–15)
BUN: 13 mg/dL (ref 6–20)
CO2: 23 mmol/L (ref 22–32)
Calcium: 9.2 mg/dL (ref 8.9–10.3)
Chloride: 108 mmol/L (ref 98–111)
Creatinine, Ser: 0.9 mg/dL (ref 0.61–1.24)
GFR, Estimated: >60 mL/min (ref >60)
Glucose, Bld: 154 mg/dL — ABNORMAL HIGH (ref 70–99)
Potassium: 4.1 mmol/L (ref 3.5–5.1)
Sodium: 140 mmol/L (ref 135–145)

## 2020-09-07 NOTE — ED Notes (Signed)
No cxr at this time per dr Tamala Julian

## 2020-09-07 NOTE — ED Triage Notes (Signed)
Pt states he was seen here last night for difficulty breathing, he was given an inhaler and pt states it has helped the right side of his chest feel better but not the left side. Pt states he feels inflammation on left side

## 2020-09-07 NOTE — ED Notes (Signed)
No answer when called several times from lobby, no answer when cell phone # listed in chart called

## 2020-09-07 NOTE — Telephone Encounter (Signed)
Transition Care Management Follow-up Telephone Call  Date of discharge and from where: 09/06/20 [facility] ED  How have you been since you were released from the hospital? "Doing better"  Any questions or concerns? No  Items Reviewed:  Did the pt receive and understand the discharge instructions provided? Yes   Medications obtained and verified? Yes   Other? N/A  Any new allergies since your discharge? No  Dietary orders reviewed? Yes  Do you have support at home? Yes   Home Care and Equipment/Supplies: Were home health services ordered? not applicable If so, what is the name of the agency? N/A  Has the agency set up a time to come to the patient's home? not applicable Were any new equipment or medical supplies ordered?  No What is the name of the medical supply agency? N/A Were you able to get the supplies/equipment? not applicable Do you have any questions related to the use of the equipment or supplies? No  Functional Questionnaire: (I = Independent and D = Dependent) ADLs: I  Bathing/Dressing- I  Meal Prep- I  Eating- I  Maintaining continence- I  Transferring/Ambulation- I  Managing Meds- I  Follow up appointments reviewed:   PCP Hospital f/u appt confirmed? Yes  Scheduled to see Dr. Margarita Rana on 09/08/20 @ 0910.  [facility] f/u appt confirmed? N/A   Are transportation arrangements needed? No   If their condition worsens, is the pt aware to call PCP or go to the Emergency Dept.? Yes  Was the patient provided with contact information for the PCP's office or ED? Yes  Was to pt encouraged to call back with questions or concerns? Yes

## 2020-09-08 ENCOUNTER — Other Ambulatory Visit: Payer: Self-pay

## 2020-09-08 ENCOUNTER — Encounter: Payer: Medicaid Other | Admitting: Physical Therapy

## 2020-09-08 ENCOUNTER — Ambulatory Visit: Payer: Medicaid Other | Attending: Family Medicine | Admitting: Family Medicine

## 2020-09-08 ENCOUNTER — Encounter: Payer: Self-pay | Admitting: Family Medicine

## 2020-09-08 ENCOUNTER — Emergency Department
Admission: EM | Admit: 2020-09-08 | Discharge: 2020-09-08 | Disposition: A | Discharge status: Home / Self Care | Payer: Medicaid Other | Attending: Emergency Medicine | Admitting: Emergency Medicine

## 2020-09-08 VITALS — BP 130/75 | HR 74 | Ht 68.0 in | Wt 195.6 lb

## 2020-09-08 DIAGNOSIS — D320 Benign neoplasm of cerebral meninges: Secondary | ICD-10-CM | POA: Diagnosis not present

## 2020-09-08 DIAGNOSIS — R569 Unspecified convulsions: Secondary | ICD-10-CM | POA: Diagnosis not present

## 2020-09-08 DIAGNOSIS — R251 Tremor, unspecified: Secondary | ICD-10-CM | POA: Diagnosis not present

## 2020-09-08 DIAGNOSIS — M79606 Pain in leg, unspecified: Secondary | ICD-10-CM | POA: Diagnosis not present

## 2020-09-08 DIAGNOSIS — Z87891 Personal history of nicotine dependence: Secondary | ICD-10-CM | POA: Insufficient documentation

## 2020-09-08 DIAGNOSIS — Z7982 Long term (current) use of aspirin: Secondary | ICD-10-CM | POA: Diagnosis not present

## 2020-09-08 DIAGNOSIS — Z7984 Long term (current) use of oral hypoglycemic drugs: Secondary | ICD-10-CM | POA: Diagnosis not present

## 2020-09-08 DIAGNOSIS — Z79899 Other long term (current) drug therapy: Secondary | ICD-10-CM | POA: Insufficient documentation

## 2020-09-08 DIAGNOSIS — M7541 Impingement syndrome of right shoulder: Secondary | ICD-10-CM | POA: Diagnosis not present

## 2020-09-08 DIAGNOSIS — R7303 Prediabetes: Secondary | ICD-10-CM | POA: Diagnosis not present

## 2020-09-08 LAB — GLUCOSE, POCT (MANUAL RESULT ENTRY): POC Glucose: 121 mg/dl — AB (ref 70–99)

## 2020-09-08 NOTE — Progress Notes (Signed)
Having pain in legs would like to discuss water aerobics.  Wants to discuss blood sugars and metformin.  Hands shake.

## 2020-09-08 NOTE — ED Notes (Signed)
No answer when called several times from lobby 

## 2020-09-08 NOTE — Progress Notes (Signed)
Subjective:  Patient ID: Joshua [name], male    DOB: 11/18/1960  Age: 60 y.o. MRN: [id number]  CC: Leg Pain   HPI RICHARD RITCHEY is a80 year old right-handed male male with a history of meningioma status post resection in 2014 hospitalized at [facility] from 01/03/2019 through 01/14/2019 for recurrent meningioma status post resection(the last of which was in 12/2018), seizureshere for a follow up.  He has questions about if he needs to remain on Metformin for PreDiabetes. He would like a referral for water aerobics at the [place], [place].  Complaind 'his hands shake' when he writes on some occassions. No longer smokes or drinks. Tremors are absent at rest.  Thyroid panel from 04/2020 were negative. His Neurologist is Dr Jonathon Jordan He underwent flexible sigmoidoscopy with [facility] physicians in 06/2020; per patient he was told to return in 3 years but I do not see the report. He continues to have right shoulder pain and is being seen by orthopedic for this. Past Medical History:  Diagnosis Date  . Anxiety   . Brain tumor ([place]) 02/20/2013   brain tumor removed in March 2014, Dr Donald Pore  . Diffuse idiopathic skeletal hyperostosis 06/05/2013  . Dizziness   . Enlarged prostate   . GERD (gastroesophageal reflux disease)   . Headache(784.0)    scattered  . High cholesterol   . Hypertension   . 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[place]) 03/07/2019  . Meningioma ([place])   . Meningioma, recurrent of brain ([place]) 01/31/2019  . Neuropathy 12/05/2019  . Rotator cuff tear    right  . Seizures ([place])    11/27/19 last sz 1 wk ago    Past Surgical History:  Procedure Laterality Date  . APPLICATION OF CRANIAL NAVIGATION N/A 01/10/2019   Procedure: APPLICATION OF CRANIAL NAVIGATION;  Surgeon: Erline Levine, MD;  Location: [facility];  Service: Neurosurgery;  Laterality: N/A;  . CATARACT EXTRACTION Right   . CRANIOTOMY Right 11/06/2012   Procedure: CRANIOTOMY TUMOR EXCISION;  Surgeon: Erline Levine, MD;  Location: [facility];  Service: Neurosurgery;  Laterality: Right;  Right Parasagittal craniotomy for meningioma with Stealth  . CRANIOTOMY Right 01/10/2019   Procedure: Right Parasagittal Craniotomy for Tumor;  Surgeon: Erline Levine, MD;  Location: [facility];  Service: Neurosurgery;  Laterality: Right;  Right parasagittal craniotomy for tumor  . ESOPHAGOGASTRODUODENOSCOPY    . right knee arthroscopy    . SHOULDER ARTHROSCOPY Right 06/09/2020   Procedure: RIGHT SHOULDER ARTHROSCOPY AND DEBRIDEMENT;  Surgeon: Newt Minion, MD;  Location: [facility];  Service: Orthopedics;  Laterality: Right;    Family History  Problem Relation Age of Onset  . Hypertension Mother   . Dementia Mother   . Diabetes Mother   . Hypertension Father   . CAD Father   . Diabetes Father   . CAD Brother     No Known Allergies  Outpatient Medications Prior to Visit  Medication Sig Dispense Refill  . Accu-Chek Softclix Lancets lancets Use as instructed 100 each 12  . albuterol (VENTOLIN HFA) 108 (90 Base) MCG/ACT inhaler Inhale 2 puffs into the lungs every 6 (six) hours as needed for wheezing or shortness of breath. 8 g 2  . amLODipine (NORVASC) 5 MG tablet Take 1 tablet (5 mg total) by mouth daily. To lower blood pressure 30 tablet 4  . aspirin 81 MG chewable tablet     . atorvastatin (LIPITOR) 20 MG tablet TAKE 1 TABLET BY MOUTH EVERY DAY 30 tablet  0  . clonazePAM (KLONOPIN) 0.5 MG tablet TAKE 1 TABLET BY MOUTH TWICE A DAY 60 tablet 3  . glucose blood (ACCU-CHEK AVIVA PLUS) test strip Use as instructed 100 each 12  . HYDROcodone-acetaminophen (NORCO/VICODIN) 5-325 MG tablet Take 1 tablet by mouth every 8 (eight) hours as needed for moderate pain. 30 tablet 0  . lamoTRIgine (LAMICTAL) 100 MG tablet TAKE 2 TABLETS BY MOUTH 2 TIMES DAILY. 120 tablet 0  . methocarbamol (ROBAXIN) 500 MG tablet Take 1 tablet (500 mg total) by mouth every 8 (eight) hours as needed for muscle spasms. 8 tablet 0   . Misc. Devices MISC Please provide BP cuff for patient 1 each 0  . ondansetron (ZOFRAN) 4 MG tablet Take 1 tablet (4 mg total) by mouth every 8 (eight) hours as needed for nausea or vomiting. 20 tablet 0  . pantoprazole (PROTONIX) 20 MG tablet Take 1 tablet (20 mg total) by mouth 2 (two) times daily before a meal. 60 tablet 3  . tamsulosin (FLOMAX) 0.4 MG CAPS capsule Take 1 capsule (0.4 mg total) by mouth daily. 90 capsule 3  . VIMPAT 100 MG TABS TAKE 2 TABLETS IN MORNING AND 2 AT BEDTIME 60 tablet 1  . metFORMIN (GLUCOPHAGE) 500 MG tablet Take 1 tablet (500 mg total) by mouth 2 (two) times daily with a meal. (Patient not taking: Reported on 09/08/2020) 180 tablet 3  . omeprazole (PRILOSEC) 20 MG capsule One pill twice daily- morning and before bedtime to reduce stomach acid (Patient not taking: Reported on 09/08/2020) 180 capsule 1   No facility-administered medications prior to visit.     ROS Review of Systems  Constitutional: Negative for activity change and appetite change.  HENT: Negative for sinus pressure and sore throat.   Eyes: Negative for visual disturbance.  Respiratory: Negative for cough, chest tightness and shortness of breath.   Cardiovascular: Negative for chest pain and leg swelling.  Gastrointestinal: Negative for abdominal distention, abdominal pain, constipation and diarrhea.  Endocrine: Negative.   Genitourinary: Negative for dysuria.  Musculoskeletal:       See HPI  Skin: Negative for rash.  Allergic/Immunologic: Negative.   Neurological: Positive for tremors. Negative for weakness, light-headedness and numbness.  Psychiatric/Behavioral: Negative for dysphoric mood and suicidal ideas.    Objective:  BP 130/75   Pulse 74   Ht 5\' 8"  (1.727 m)   Wt 195 lb 9.6 oz (88.7 kg)   SpO2 98%   BMI 29.74 kg/m   BP/Weight 09/08/2020 09/08/2020 01/06/2978  Systolic BP 892 - 119  Diastolic BP 75 - 98  Wt. (Lbs) 195.6 - 179.98  BMI 29.74 27.37 -  Some encounter  information is confidential and restricted. Go to Review Flowsheets activity to see all data.      Physical Exam Constitutional:      Appearance: He is well-developed.  Neck:     Vascular: No JVD.  Cardiovascular:     Rate and Rhythm: Normal rate.     Heart sounds: Normal heart sounds. No murmur heard.   Pulmonary:     Effort: Pulmonary effort is normal.     Breath sounds: Normal breath sounds. No wheezing or rales.  Chest:     Chest wall: No tenderness.  Abdominal:     General: Bowel sounds are normal. There is no distension.     Palpations: Abdomen is soft. There is no mass.     Tenderness: There is no abdominal tenderness.  Musculoskeletal:     Right  lower leg: No edema.     Left lower leg: No edema.     Comments: Slightly decreased range of motion in right shoulder with associated tenderness  Neurological:     Mental Status: He is alert and oriented to person, place, and time.     Comments: Slight tremor of the outstretched hand  Psychiatric:        Mood and Affect: Mood normal.     CMP Latest Ref Rng & Units 09/07/2020 09/06/2020 07/18/2020  Glucose 70 - 99 mg/dL 154(H) 111(H) 144(H)  BUN 6 - 20 mg/dL 13 13 19   Creatinine 0.61 - 1.24 mg/dL 0.90 0.83 0.90  Sodium 135 - 145 mmol/L 140 139 139  Potassium 3.5 - 5.1 mmol/L 4.1 4.0 3.6  Chloride 98 - 111 mmol/L 108 103 102  CO2 22 - 32 mmol/L 23 25 24   Calcium 8.9 - 10.3 mg/dL 9.2 9.2 9.8  Total Protein 6.5 - 8.1 g/dL - - -  Total Bilirubin 0.3 - 1.2 mg/dL - - -  Alkaline Phos 38 - 126 U/L - - -  AST 15 - 41 U/L - - -  ALT 0 - 44 U/L - - -    Lipid Panel  No results found for: CHOL, TRIG, HDL, CHOLHDL, VLDL, LDLCALC, LDLDIRECT  CBC    Component Value Date/Time   WBC 8.8 09/07/2020 2113   RBC 4.81 09/07/2020 2113   HGB 14.5 09/07/2020 2113   HCT 44.0 09/07/2020 2113   PLT 310 09/07/2020 2113   MCV 91.5 09/07/2020 2113   MCH 30.1 09/07/2020 2113   MCHC 33.0 09/07/2020 2113   RDW 12.0 09/07/2020 2113    LYMPHSABS 2.4 03/10/2020 0533   MONOABS 0.7 03/10/2020 0533   EOSABS 0.2 03/10/2020 0533   BASOSABS 0.1 03/10/2020 0533    Lab Results  Component Value Date   HGBA1C 6.2 (A) 07/01/2020    Assessment & Plan:  1. Prediabetes Controlled with A1c of 6.2 I have discussed with him the role of Metformin in delaying progression to diabetes After shared decision making he has consented to continue with Metformin Continue with diabetic diet and lifestyle modifications - POCT glucose (manual entry)  2. Tremor We will check for underlying cause Advised to discuss this with his neurologist - Vitamin B12 - T4, free - TSH  3. Impingement syndrome of right shoulder Referred for aquatic therapy as requested - Ambulatory referral to Physical Therapy    No orders of the defined types were placed in this encounter.   Follow-up: Return in about 3 months (around 12/06/2020) for Chronic disease management.       Charlott Rakes, MD, FAAFP. [facility] [place], [place]   09/08/2020, 2:03 PM

## 2020-09-08 NOTE — Patient Instructions (Signed)

## 2020-09-09 LAB — VITAMIN B12: Vitamin B-12: 551 pg/mL (ref 232–1245)

## 2020-09-09 LAB — TSH: TSH: 1.11 u[IU]/mL (ref 0.450–4.500)

## 2020-09-09 LAB — T4, FREE: Free T4: 1.52 ng/dL (ref 0.82–1.77)

## 2020-09-10 ENCOUNTER — Emergency Department: Payer: Medicaid Other

## 2020-09-10 ENCOUNTER — Telehealth: Payer: Self-pay | Admitting: Family Medicine

## 2020-09-10 ENCOUNTER — Telehealth: Payer: Self-pay

## 2020-09-10 ENCOUNTER — Encounter: Payer: Self-pay | Admitting: *Deleted

## 2020-09-10 ENCOUNTER — Other Ambulatory Visit: Payer: Self-pay

## 2020-09-10 ENCOUNTER — Emergency Department
Admission: EM | Admit: 2020-09-10 | Discharge: 2020-09-10 | Disposition: A | Discharge status: Home / Self Care | Payer: Medicaid Other | Attending: Emergency Medicine | Admitting: Emergency Medicine

## 2020-09-10 ENCOUNTER — Encounter: Payer: Medicaid Other | Admitting: Physical Therapy

## 2020-09-10 DIAGNOSIS — Z87891 Personal history of nicotine dependence: Secondary | ICD-10-CM | POA: Insufficient documentation

## 2020-09-10 DIAGNOSIS — Z79899 Other long term (current) drug therapy: Secondary | ICD-10-CM | POA: Diagnosis not present

## 2020-09-10 DIAGNOSIS — Z85841 Personal history of malignant neoplasm of brain: Secondary | ICD-10-CM | POA: Diagnosis not present

## 2020-09-10 DIAGNOSIS — R059 Cough, unspecified: Secondary | ICD-10-CM | POA: Diagnosis not present

## 2020-09-10 DIAGNOSIS — I10 Essential (primary) hypertension: Secondary | ICD-10-CM | POA: Insufficient documentation

## 2020-09-10 DIAGNOSIS — Z7982 Long term (current) use of aspirin: Secondary | ICD-10-CM | POA: Insufficient documentation

## 2020-09-10 MED ORDER — AZITHROMYCIN 500 MG PO TABS
500.0000 mg | ORAL_TABLET | Freq: Once | ORAL | Status: AC
  Administered 2020-09-10: 500 mg via ORAL
  Filled 2020-09-10: qty 1

## 2020-09-10 MED ORDER — AZITHROMYCIN 250 MG PO TABS: ORAL_TABLET | ORAL | 0 refills | Status: DC

## 2020-09-10 MED ORDER — BENZONATATE 100 MG PO CAPS: 100.0000 mg | ORAL_CAPSULE | Freq: Two times a day (BID) | ORAL | 0 refills | Status: DC | PRN

## 2020-09-10 NOTE — Telephone Encounter (Signed)
He did not complain of a cough at his visit with me but I do see that he saw Dr Erik Obey previously and has an upcoming visit in 2 weeks to follow up. I have sent Tessalon perles sent to his Pharmacy

## 2020-09-10 NOTE — ED Notes (Signed)
See triage note  Presents with cough  States he has had this same problem for about 6 months  Has been to see his PCP and ENT  Placed on 3 different meds for reflux  States he is not any better  conts' t cough up green phlegm  No fever

## 2020-09-10 NOTE — ED Triage Notes (Signed)
Pt reports a cough with green phlegm.  Pt was here recently for similar sx.  Pt alert  Speech clear.

## 2020-09-10 NOTE — ED Provider Notes (Signed)
[facility] Emergency Department Provider Note  ____________________________________________  Time seen: Approximately 6:04 PM  I have reviewed the triage vital signs and the nursing notes.   HISTORY  Chief Complaint Cough    HPI Joshua [name] is a 60 y.o. male who presents the emergency department for evaluation of chronic cough.  Patient states that he has had a continuous productive cough x6 months.  States that he is not coughing at baseline but will feel a "congestion" in his chest, coughs and then will cough up some mucus.  Patient states that this does not appear to be exercise-induced.  He has had no fevers, chills, nasal congestion.  Patient states that he has been seen by ENT and is primary care for same.  He states that they believe that it is GERD related and have placed him on multiple acid reflux medications.  Patient states that it has helped with his belching, heartburn sensation has not improved his cough.  Patient does not have a history of emphysema, COPD or chronic bronchitis.  He was given an inhaler for a diagnosis of bronchitis and states that when he uses the inhaler he has no cough but the benefits of the inhaler only last 1 to 2 hours.  Patient states that he will then start feeling a congestion type feel and have a cough.  No pain with the cough.  No hemoptysis.         Past Medical History:  Diagnosis Date  . Anxiety   . Brain tumor ([place]) 02/20/2013   brain tumor removed in March 2014, Dr Donald Pore  . Diffuse idiopathic skeletal hyperostosis 06/05/2013  . Dizziness   . Enlarged prostate   . GERD (gastroesophageal reflux disease)   . Headache(784.0)    scattered  . High cholesterol   . Hypertension   . 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[place]) 03/07/2019  . Meningioma ([place])   . Meningioma, recurrent of brain ([place]) 01/31/2019  . Neuropathy 12/05/2019  . Rotator cuff tear    right  . Seizures ([place])    11/27/19 last sz 1 wk  ago    Patient Active Problem List   Diagnosis Date Noted  . Essential hypertension 07/02/2020  . Prediabetes 07/02/2020  . Polyuria 07/02/2020  . Nontraumatic complete tear of right rotator cuff   . Impingement syndrome of right shoulder   . Neuropathy 12/05/2019  . 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[place]) 03/07/2019  . Seizures ([place]) 01/31/2019  . Meningioma, recurrent of brain ([place]) 01/31/2019  . Meningioma ([place]) 01/03/2019  . Diffuse idiopathic skeletal hyperostosis 06/05/2013    Past Surgical History:  Procedure Laterality Date  . APPLICATION OF CRANIAL NAVIGATION N/A 01/10/2019   Procedure: APPLICATION OF CRANIAL NAVIGATION;  Surgeon: Erline Levine, MD;  Location: [facility];  Service: Neurosurgery;  Laterality: N/A;  . CATARACT EXTRACTION Right   . CRANIOTOMY Right 11/06/2012   Procedure: CRANIOTOMY TUMOR EXCISION;  Surgeon: Erline Levine, MD;  Location: [facility];  Service: Neurosurgery;  Laterality: Right;  Right Parasagittal craniotomy for meningioma with Stealth  . CRANIOTOMY Right 01/10/2019   Procedure: Right Parasagittal Craniotomy for Tumor;  Surgeon: Erline Levine, MD;  Location: [facility];  Service: Neurosurgery;  Laterality: Right;  Right parasagittal craniotomy for tumor  . ESOPHAGOGASTRODUODENOSCOPY    . right knee arthroscopy    . SHOULDER ARTHROSCOPY Right 06/09/2020   Procedure: RIGHT SHOULDER ARTHROSCOPY AND DEBRIDEMENT;  Surgeon: Newt Minion, MD;  Location: [facility];  Service: Orthopedics;  Laterality: Right;    Prior to Admission medications   Medication Sig Start Date End Date Taking? Authorizing Provider  azithromycin (ZITHROMAX Z-PAK) 250 MG tablet Take 2 tablets (500 mg) on  Day 1,  followed by 1 tablet (250 mg) once daily on Days 2 through 5. 09/10/20  Yes Matti Minney, Charline Bills, PA-C  Accu-Chek Softclix Lancets lancets Use as instructed 07/08/20   Mayers, Cari S, PA-C  albuterol (VENTOLIN HFA) 108 (90 Base) MCG/ACT inhaler Inhale 2  puffs into the lungs every 6 (six) hours as needed for wheezing or shortness of breath. 09/06/20   Merlyn Lot, MD  amLODipine (NORVASC) 5 MG tablet Take 1 tablet (5 mg total) by mouth daily. To lower blood pressure 05/06/20   Fulp, Cammie, MD  aspirin 81 MG chewable tablet     [provider]  atorvastatin (LIPITOR) 20 MG tablet TAKE 1 TABLET BY MOUTH EVERY DAY 08/27/20   Charlott Rakes, MD  benzonatate (TESSALON) 100 MG capsule Take 1 capsule (100 mg total) by mouth 2 (two) times daily as needed for cough. 09/10/20   Charlott Rakes, MD  clonazePAM (KLONOPIN) 0.5 MG tablet TAKE 1 TABLET BY MOUTH TWICE A DAY 08/25/20   Vaslow, Acey Lav, MD  glucose blood (ACCU-CHEK AVIVA PLUS) test strip Use as instructed 07/08/20   Mayers, Cari S, PA-C  HYDROcodone-acetaminophen (NORCO/VICODIN) 5-325 MG tablet Take 1 tablet by mouth every 8 (eight) hours as needed for moderate pain. 09/02/20   Persons, Bevely Palmer, PA  lamoTRIgine (LAMICTAL) 100 MG tablet TAKE 2 TABLETS BY MOUTH 2 TIMES DAILY. 08/25/20   Vaslow, Acey Lav, MD  methocarbamol (ROBAXIN) 500 MG tablet Take 1 tablet (500 mg total) by mouth every 8 (eight) hours as needed for muscle spasms. 04/22/20   Davonna Belling, MD  Misc. Devices MISC Please provide BP cuff for patient 07/08/20   Argentina Donovan, PA-C  ondansetron (ZOFRAN) 4 MG tablet Take 1 tablet (4 mg total) by mouth every 8 (eight) hours as needed for nausea or vomiting. 07/31/20   Swords, Darrick Penna, MD  pantoprazole (PROTONIX) 20 MG tablet Take 1 tablet (20 mg total) by mouth 2 (two) times daily before a meal. 5/40/08 6/76/19  Jodi Marble, MD  tamsulosin (FLOMAX) 0.4 MG CAPS capsule Take 1 capsule (0.4 mg total) by mouth daily. 07/23/20   Billey Co, MD  VIMPAT 100 MG TABS TAKE 2 TABLETS IN MORNING AND 2 AT BEDTIME 05/11/20   Vaslow, Acey Lav, MD    Allergies Patient has no known allergies.  Family History  Problem Relation Age of Onset  . Hypertension Mother   . Dementia  Mother   . Diabetes Mother   . Hypertension Father   . CAD Father   . Diabetes Father   . CAD Brother     Social History Social History   Tobacco Use  . Smoking status: Former Smoker    Types: Cigarettes    Quit date: 01/29/2019    Years since quitting: 1.6  . Smokeless tobacco: Never Used  . Tobacco comment: weekend smoker  Vaping Use  . Vaping Use: Never used  Substance Use Topics  . Alcohol use: Not Currently    Comment: social  . Drug use: No     Review of Systems  Constitutional: No fever/chills Eyes: No visual changes. No discharge ENT: No upper respiratory complaints. Cardiovascular: no chest pain. Respiratory: Chronic cough.. No SOB. Gastrointestinal: No abdominal pain.  No nausea, no vomiting.  No diarrhea.  No constipation. Musculoskeletal: Negative for musculoskeletal pain. Skin: Negative for rash, abrasions, lacerations, ecchymosis. Neurological: Negative for headaches, focal weakness or numbness.  10 System ROS otherwise negative.  ____________________________________________   PHYSICAL EXAM:  VITAL SIGNS: ED Triage Vitals  Enc Vitals Group     BP 09/10/20 1703 133/86     Pulse Rate 09/10/20 1703 90     Resp 09/10/20 1703 18     Temp 09/10/20 1703 99.3 F (37.4 C)     Temp Source 09/10/20 1703 Oral     SpO2 09/10/20 1703 98 %     Weight 09/10/20 1700 180 lb (81.6 kg)     Height 09/10/20 1700 5\' 8"  (1.727 m)     Head Circumference --      Peak Flow --      Pain Score 09/10/20 1700 6     Pain Loc --      Pain Edu? --      Excl. in [place]? --      Constitutional: Alert and oriented. Well appearing and in no acute distress. Eyes: Conjunctivae are normal. PERRL. EOMI. Head: Atraumatic. ENT:      Ears:       Nose: No congestion/rhinnorhea.      Mouth/Throat: Mucous membranes are moist.  Neck: No stridor.    Cardiovascular: Normal rate, regular rhythm. Normal S1 and S2.  Good peripheral circulation. Respiratory: Normal respiratory effort  without tachypnea or retractions. Lungs CTAB. Good air entry to the bases with no decreased or absent breath sounds. Musculoskeletal: Full range of motion to all extremities. No gross deformities appreciated. Neurologic:  Normal speech and language. No gross focal neurologic deficits are appreciated.  Skin:  Skin is warm, dry and intact. No rash noted. Psychiatric: Mood and affect are normal. Speech and behavior are normal. Patient exhibits appropriate insight and judgement.   ____________________________________________   LABS (all labs ordered are listed, but only abnormal results are displayed)  Labs Reviewed - No data to display ____________________________________________  EKG   ____________________________________________  RADIOLOGY I personally viewed and evaluated these images as part of my medical decision making, as well as reviewing the written report by the radiologist.  ED Provider Interpretation:   DG Chest 2 View  Result Date: 09/10/2020 CLINICAL DATA:  Cough for 6 months. EXAM: CHEST - 2 VIEW COMPARISON:  09/06/2020 FINDINGS: The heart size and mediastinal contours are within normal limits. Aortic atherosclerotic calcification noted. Both lungs are clear. The visualized skeletal structures are unremarkable. IMPRESSION: No active cardiopulmonary disease. Electronically Signed   By: Marlaine Hind M.D.   On: 09/10/2020 19:07    ____________________________________________    PROCEDURES  Procedure(s) performed:    Procedures    Medications  azithromycin (ZITHROMAX) tablet 500 mg (has no administration in time range)     ____________________________________________   INITIAL IMPRESSION / ASSESSMENT AND PLAN / ED COURSE  Pertinent labs & imaging results that were available during my care of the patient were reviewed by me and considered in my medical decision making (see chart for details).  Review of the [place] CSRS was performed in accordance of the [place]  prior to dispensing any controlled drugs.           Patient's diagnosis is consistent with cough.  Patient presented to the emergency department with 6 months of ongoing cough.  Patient states that he has chest congestion that causes him to cough, he does not have coughing at baseline.  This not appear  to be exercise-induced.  He states that he has had no other URI symptoms.  Patient has been seeing primary care and ENT and they have been attempting to treat this with medications for GERD.  He states that his GERD symptoms have improved but the cough is not.  He describes it as productive with a mucus when he coughs.  No increased work of breathing.  Patient has no history of COPD, emphysema, chronic bronchitis or history of asthma.  Patient states that he was seen a week ago in this department for similar symptoms including chest pain.  No chest pain today.  Work-up was reassuring at that time.  Today all I performed was an x-ray as he had had labs, CT scan of the chest a week ago.  X-ray remains reassuring with no consolidation concerning for pneumonia.  As patient has received multiple other treatments but is never received an antibiotic I will trial a short course of Z-Pak though I do not believe that this is bacterial.  Given the chronic changes on x-ray, the chronic cough with some mucus production I am somewhat suspicious for COPD.  At this time I have recommended the patient follow-up with pulmonology for his chronic cough and he is agreeable to same.  Patient has had some difficulty with his diabetes and controlling his blood sugars and at this time as he is in not in any kind of distress with no adventitious lung sounds I will not put the patient on a steroid.  Patient may use his albuterol inhaler that has been previously prescribed as this seems to improve his symptoms.  Again follow-up with pulmonology.  Return precautions discussed with the patient. Patient is given ED precautions to return to  the ED for any worsening or new symptoms.     ____________________________________________  FINAL CLINICAL IMPRESSION(S) / ED DIAGNOSES  Final diagnoses:  Cough      NEW MEDICATIONS STARTED DURING THIS VISIT:  ED Discharge Orders         Ordered    azithromycin (ZITHROMAX Z-PAK) 250 MG tablet        09/10/20 1942              This chart was dictated using voice recognition software/Dragon. Despite best efforts to proofread, errors can occur which can change the meaning. Any change was purely unintentional.    Darletta Moll, PA-C 09/10/20 1945    Duffy Bruce, MD 09/14/20 2124

## 2020-09-10 NOTE — Telephone Encounter (Signed)
Pt has cough, Last ov does not state he had a cough.

## 2020-09-10 NOTE — Telephone Encounter (Signed)
Pt states he has been coughing up green mucus for at least 6 months.  Pt states he told Dr Margarita Rana about this at his appt and nothing was changed. No new medicine.  Pt is hoping she can address this asap, as he gets worse everyday.  Pt had appt 09/08/20. Pt would like the nurse for Dr Margarita Rana to call him back. Please advise. Pt would like a call back before the weekend.

## 2020-09-10 NOTE — Telephone Encounter (Signed)
Received a call today from Joshua [name] stating that he needed a letter from Dr. Mickeal Skinner for a certain legal situation that he is involved in. Per Dr. Mickeal Skinner the Patient was instructed to have his Lawyer reach out to [place]. Patient verbalized understanding and confirmed that he would have his Lawyer call us in order to speak to Dr. Mickeal Skinner.

## 2020-09-11 ENCOUNTER — Telehealth: Payer: Self-pay

## 2020-09-11 ENCOUNTER — Telehealth: Payer: Self-pay | Admitting: *Deleted

## 2020-09-11 NOTE — Telephone Encounter (Signed)
Vm was left informing patient of normal results.

## 2020-09-11 NOTE — Telephone Encounter (Signed)
-----   Message from Charlott Rakes, MD sent at 09/09/2020  7:31 AM EST ----- Please inform the patient that labs are normal. Thank you.

## 2020-09-11 NOTE — Telephone Encounter (Signed)
Transition Care Management Unsuccessful Follow-up Telephone Call  Date of discharge and from where:  09/10/2020 [facility] ED  Attempts:  1st Attempt  Reason for unsuccessful TCM follow-up call:  Left voice message

## 2020-09-14 NOTE — Telephone Encounter (Signed)
Transition Care Management Unsuccessful Follow-up Telephone Call  Date of discharge and from where:  09/10/2020 [facility] ED  Attempts:  2nd Attempt  Reason for unsuccessful TCM follow-up call:  Left voice message

## 2020-09-15 ENCOUNTER — Encounter: Payer: Medicaid Other | Admitting: Physical Therapy

## 2020-09-15 ENCOUNTER — Telehealth: Payer: Self-pay

## 2020-09-15 ENCOUNTER — Telehealth: Payer: Self-pay | Admitting: Oncology

## 2020-09-15 ENCOUNTER — Telehealth: Payer: Self-pay | Admitting: Family Medicine

## 2020-09-15 NOTE — Telephone Encounter (Signed)
Pt called stating he has some intermittant shaking in his hands and tingling in his feet and was seen by his PCP who advised he should follow-up with Dr. Mickeal Skinner.   Pt has Brain MRI scheduled for 09/24/20 but he didn't remember he scheduled this. Pt was encouraged to make sure he presents for this exam and date/time was provided.  Pt wants to know if she can move his appt with Dr. Mickeal Skinner from 10/06/20 to either 3/14 or 3/15 so he can be seen sooner. Pt states he feels better knowing he has an upcoming MRI.   Schedule message sent to move pts 3/22 appt to the week of 09/28/20.

## 2020-09-15 NOTE — Telephone Encounter (Signed)
Rescheduled appt per 3/1 sch msg. Pt confirmed appt cancellation and new appt date and time.

## 2020-09-15 NOTE — Telephone Encounter (Signed)
Transition Care Management Unsuccessful Follow-up Telephone Call  Date of discharge and from where:  09/10/2020 [facility] ED  Attempts:  3rd Attempt  Reason for unsuccessful TCM follow-up call:  Left voice message

## 2020-09-15 NOTE — Telephone Encounter (Signed)
Copied from [place] [id number]. Topic: General - Other >> Sep 15, 2020  8:52 AM Celene Kras wrote: Reason for CRM: Pt called in stating that he has been asking for water therapy for his legs and feet, but is now requesting to have PT instead. Please advise.

## 2020-09-16 ENCOUNTER — Ambulatory Visit: Payer: Medicaid Other

## 2020-09-16 ENCOUNTER — Ambulatory Visit (INDEPENDENT_AMBULATORY_CARE_PROVIDER_SITE_OTHER): Payer: Medicaid Other | Admitting: Orthopedic Surgery

## 2020-09-16 ENCOUNTER — Encounter: Payer: Self-pay | Admitting: Orthopedic Surgery

## 2020-09-16 DIAGNOSIS — M7541 Impingement syndrome of right shoulder: Secondary | ICD-10-CM | POA: Diagnosis not present

## 2020-09-16 NOTE — Progress Notes (Signed)
a 

## 2020-09-16 NOTE — Telephone Encounter (Signed)
Referral to PT has been submitted.

## 2020-09-16 NOTE — Progress Notes (Signed)
Office Visit Note   Patient: Joshua [name]           Date of Birth: 02/19/1961           MRN: [id number] Visit Date: 09/16/2020 Requested by: Charlott Rakes, MD [place],  [place] [address] PCP: Charlott Rakes, MD  Subjective: Chief Complaint  Patient presents with  . Right Shoulder - Pain    HPI: Wanda is a 60 year old patient with right shoulder pain.  He underwent right shoulder scope and debridement 06/09/2020 with significant pain before and after surgery.  Was found to have early wear and tear in the shoulder joint along with retracted nonrepairable infraspinatus and supraspinatus rotator cuff tendon tears.  MRI scan is reviewed.  Patient describes both pain and weakness.  Uses a cane in the right hand.  Has had 2 brain surgeries for hemangiomas.  Also has had hip replacements without difficulty.  Patient states his legs are weak.  Has a history of seizures but none in the past year.  Op note is reviewed.              ROS: All systems reviewed are negative as they relate to the chief complaint within the history of present illness.  Patient denies  fevers or chills.   Assessment & Plan: Visit Diagnoses:  1. Impingement syndrome of right shoulder     Plan: Impression is rotator cuff arthropathy with pain and weakness in the right shoulder.  Patient is 31 but is not particularly active.  We discussed operative treatment options which she is interested in at this time.  Discussed lower trapezius transfer which would help with some pain as well as some of his weakness.  Reverse shoulder replacement also under consideration as a more predictable surgery but difficult to recommend because of his young age.  After extensive discussion the patient is most interested in reverse shoulder replacement.  I think for his level of activity and for predictable pain relief and improving his chances to get overhead that is likely his best bet.  The risk and benefits of the  procedure are discussed with the patient including not limited to infection nerve vessel damage incomplete pain relief as well as the lifetime lifting restriction on that right shoulder.  Patient understands risk and benefits and wishes to proceed.  Plan for thin cut CT scan to evaluate and optimize implant position with surgery to follow Follow-Up Instructions: No follow-ups on file.   Orders:  Orders Placed This Encounter  Procedures  . CT SHOULDER RIGHT WO CONTRAST   No orders of the defined types were placed in this encounter.     Procedures: No procedures performed   Clinical Data: No additional findings.  Objective: Vital Signs: There were no vitals taken for this visit.  Physical Exam:   Constitutional: Patient appears well-developed HEENT:  Head: Normocephalic Eyes:EOM are normal Neck: Normal range of motion Cardiovascular: Normal rate Pulmonary/chest: Effort normal Neurologic: Patient is alert Skin: Skin is warm Psychiatric: Patient has normal mood and affect    Ortho Exam: Ortho exam demonstrates well-healed surgical incisions around the right shoulder.  Patient has weakness to infraspinatus and supraspinatus testing on the right compared to the left.  Subscap strength intact.  Passive external rotation at 15 degrees of abduction is about 60 degrees bilaterally.  Isolated glenohumeral abduction in his 80 actively and 90 forward flexion active.  Passively I can get him up to about 150.  No discrete AC joint  tenderness to direct palpation.  No masses lymphadenopathy or skin changes noted in the shoulder girdle region on the right.  Specialty Comments:  No specialty comments available.  Imaging: No results found.   PMFS History: Patient Active Problem List   Diagnosis Date Noted  . Essential hypertension 07/02/2020  . Prediabetes 07/02/2020  . Polyuria 07/02/2020  . Nontraumatic complete tear of right rotator cuff   . Impingement syndrome of right shoulder    . Neuropathy 12/05/2019  . 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[place]) 03/07/2019  . Seizures ([place]) 01/31/2019  . Meningioma, recurrent of brain ([place]) 01/31/2019  . Meningioma ([place]) 01/03/2019  . Diffuse idiopathic skeletal hyperostosis 06/05/2013   Past Medical History:  Diagnosis Date  . Anxiety   . Brain tumor ([place]) 02/20/2013   brain tumor removed in March 2014, Dr Donald Pore  . Diffuse idiopathic skeletal hyperostosis 06/05/2013  . Dizziness   . Enlarged prostate   . GERD (gastroesophageal reflux disease)   . Headache(784.0)    scattered  . High cholesterol   . Hypertension   . 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[place]) 03/07/2019  . Meningioma ([place])   . Meningioma, recurrent of brain ([place]) 01/31/2019  . Neuropathy 12/05/2019  . Rotator cuff tear    right  . Seizures ([place])    11/27/19 last sz 1 wk ago    Family History  Problem Relation Age of Onset  . Hypertension Mother   . Dementia Mother   . Diabetes Mother   . Hypertension Father   . CAD Father   . Diabetes Father   . CAD Brother     Past Surgical History:  Procedure Laterality Date  . APPLICATION OF CRANIAL NAVIGATION N/A 01/10/2019   Procedure: APPLICATION OF CRANIAL NAVIGATION;  Surgeon: Erline Levine, MD;  Location: [facility];  Service: Neurosurgery;  Laterality: N/A;  . CATARACT EXTRACTION Right   . CRANIOTOMY Right 11/06/2012   Procedure: CRANIOTOMY TUMOR EXCISION;  Surgeon: Erline Levine, MD;  Location: [facility];  Service: Neurosurgery;  Laterality: Right;  Right Parasagittal craniotomy for meningioma with Stealth  . CRANIOTOMY Right 01/10/2019   Procedure: Right Parasagittal Craniotomy for Tumor;  Surgeon: Erline Levine, MD;  Location: [facility];  Service: Neurosurgery;  Laterality: Right;  Right parasagittal craniotomy for tumor  . ESOPHAGOGASTRODUODENOSCOPY    . right knee arthroscopy    . SHOULDER ARTHROSCOPY Right 06/09/2020   Procedure: RIGHT SHOULDER ARTHROSCOPY AND DEBRIDEMENT;  Surgeon: Newt Minion, MD;  Location: [facility];  Service: Orthopedics;  Laterality: Right;   Social History   Occupational History  . Not on file  Tobacco Use  . Smoking status: Former Smoker    Types: Cigarettes    Quit date: 01/29/2019    Years since quitting: 1.6  . Smokeless tobacco: Never Used  . Tobacco comment: weekend smoker  Vaping Use  . Vaping Use: Never used  Substance and Sexual Activity  . Alcohol use: Not Currently    Comment: social  . Drug use: No  . Sexual activity: Not Currently

## 2020-09-17 ENCOUNTER — Encounter: Payer: Medicaid Other | Admitting: Physical Therapy

## 2020-09-21 ENCOUNTER — Encounter: Payer: Self-pay | Admitting: Emergency Medicine

## 2020-09-21 ENCOUNTER — Other Ambulatory Visit: Payer: Self-pay

## 2020-09-21 ENCOUNTER — Emergency Department
Admission: EM | Admit: 2020-09-21 | Discharge: 2020-09-21 | Disposition: A | Discharge status: Home / Self Care | Payer: Medicaid Other | Attending: Emergency Medicine | Admitting: Emergency Medicine

## 2020-09-21 ENCOUNTER — Emergency Department: Payer: Medicaid Other

## 2020-09-21 ENCOUNTER — Ambulatory Visit: Payer: Medicaid Other

## 2020-09-21 DIAGNOSIS — R0602 Shortness of breath: Secondary | ICD-10-CM | POA: Diagnosis not present

## 2020-09-21 DIAGNOSIS — I10 Essential (primary) hypertension: Secondary | ICD-10-CM | POA: Diagnosis not present

## 2020-09-21 DIAGNOSIS — Z87891 Personal history of nicotine dependence: Secondary | ICD-10-CM | POA: Insufficient documentation

## 2020-09-21 DIAGNOSIS — Z7982 Long term (current) use of aspirin: Secondary | ICD-10-CM | POA: Diagnosis not present

## 2020-09-21 DIAGNOSIS — R079 Chest pain, unspecified: Secondary | ICD-10-CM

## 2020-09-21 DIAGNOSIS — Z79899 Other long term (current) drug therapy: Secondary | ICD-10-CM | POA: Insufficient documentation

## 2020-09-21 DIAGNOSIS — Z85841 Personal history of malignant neoplasm of brain: Secondary | ICD-10-CM | POA: Diagnosis not present

## 2020-09-21 DIAGNOSIS — R072 Precordial pain: Secondary | ICD-10-CM | POA: Diagnosis not present

## 2020-09-21 DIAGNOSIS — R0789 Other chest pain: Secondary | ICD-10-CM | POA: Diagnosis not present

## 2020-09-21 DIAGNOSIS — J9811 Atelectasis: Secondary | ICD-10-CM | POA: Diagnosis not present

## 2020-09-21 LAB — HEPATIC FUNCTION PANEL
ALT: 27 U/L (ref 0–44)
AST: 24 U/L (ref 15–41)
Albumin: 4.2 g/dL (ref 3.5–5.0)
Alkaline Phosphatase: 76 U/L (ref 38–126)
Bilirubin, Direct: <0.1 mg/dL (ref 0.0–0.2)
Total Bilirubin: 0.5 mg/dL (ref 0.3–1.2)
Total Protein: 7 g/dL (ref 6.5–8.1)

## 2020-09-21 LAB — CBC
HCT: 42 % (ref 39.0–52.0)
Hemoglobin: 14.2 g/dL (ref 13.0–17.0)
MCH: 30.5 pg (ref 26.0–34.0)
MCHC: 33.8 g/dL (ref 30.0–36.0)
MCV: 90.1 fL (ref 80.0–100.0)
Platelets: 273 10*3/uL (ref 150–400)
RBC: 4.66 MIL/uL (ref 4.22–5.81)
RDW: 12.2 % (ref 11.5–15.5)
WBC: 6.2 10*3/uL (ref 4.0–10.5)
nRBC: 0 % (ref 0.0–0.2)

## 2020-09-21 LAB — BASIC METABOLIC PANEL
Anion gap: 8 (ref 5–15)
BUN: 11 mg/dL (ref 6–20)
CO2: 26 mmol/L (ref 22–32)
Calcium: 9.2 mg/dL (ref 8.9–10.3)
Chloride: 105 mmol/L (ref 98–111)
Creatinine, Ser: 0.83 mg/dL (ref 0.61–1.24)
GFR, Estimated: >60 mL/min (ref >60)
Glucose, Bld: 137 mg/dL — ABNORMAL HIGH (ref 70–99)
Potassium: 4 mmol/L (ref 3.5–5.1)
Sodium: 139 mmol/L (ref 135–145)

## 2020-09-21 LAB — LIPASE, BLOOD: Lipase: 28 U/L (ref 11–51)

## 2020-09-21 LAB — TROPONIN I (HIGH SENSITIVITY)
Troponin I (High Sensitivity): 5 ng/L (ref <18)
Troponin I (High Sensitivity): 5 ng/L (ref <18)

## 2020-09-21 MED ORDER — ALBUTEROL SULFATE HFA 108 (90 BASE) MCG/ACT IN AERS
1.0000 | INHALATION_SPRAY | Freq: Once | RESPIRATORY_TRACT | Status: AC
  Administered 2020-09-21: 2 via RESPIRATORY_TRACT
  Filled 2020-09-21: qty 6.7

## 2020-09-21 NOTE — ED Provider Notes (Signed)
[facility] Emergency Department Provider Note  ____________________________________________   Event Date/Time   First MD Initiated Contact with Patient 09/21/20 1248     (approximate)  I have reviewed the triage vital signs and the nursing notes.   HISTORY  Chief Complaint Shortness of Breath   HPI Joshua [name] is a 60 y.o. male with a past medical history of meningioma, remote tobacco abuse (?COPD pt states never formally dx but treated in the past with inhalers), HTN, HDL, GERD, anxiety and seizure disorder who presents for assessment approximately 1 month of some substernal chest burning associated with cough some intermittent shortness of breath.  States it is intermittently feeling like it is radiating from his epigastric area into his chest.  States he is on antacid medicine but does not think this is helping much.  He does note he is scheduled for an endoscopy for later this month.  He denies any fevers, vomiting, diarrhea, dysuria, lower abdominal pain, back pain, extremity pain, headache, earache, sore throat or any other acute sick symptoms.  Dates he was seen by an ENT doctor and his PCP but does not feel like he is getting better.  Denies any other acute concerns at this time.         Past Medical History:  Diagnosis Date  . Anxiety   . Brain tumor ([place]) 02/20/2013   brain tumor removed in March 2014, Dr Donald Pore  . Diffuse idiopathic skeletal hyperostosis 06/05/2013  . Dizziness   . Enlarged prostate   . GERD (gastroesophageal reflux disease)   . Headache(784.0)    scattered  . High cholesterol   . Hypertension   . 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[place]) 03/07/2019  . Meningioma ([place])   . Meningioma, recurrent of brain ([place]) 01/31/2019  . Neuropathy 12/05/2019  . Rotator cuff tear    right  . Seizures ([place])    11/27/19 last sz 1 wk ago    Patient Active Problem List   Diagnosis Date Noted  . Essential hypertension  07/02/2020  . Prediabetes 07/02/2020  . Polyuria 07/02/2020  . Nontraumatic complete tear of right rotator cuff   . Impingement syndrome of right shoulder   . Neuropathy 12/05/2019  . 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[place]) 03/07/2019  . Seizures ([place]) 01/31/2019  . Meningioma, recurrent of brain ([place]) 01/31/2019  . Meningioma ([place]) 01/03/2019  . Diffuse idiopathic skeletal hyperostosis 06/05/2013    Past Surgical History:  Procedure Laterality Date  . APPLICATION OF CRANIAL NAVIGATION N/A 01/10/2019   Procedure: APPLICATION OF CRANIAL NAVIGATION;  Surgeon: Erline Levine, MD;  Location: [facility];  Service: Neurosurgery;  Laterality: N/A;  . CATARACT EXTRACTION Right   . CRANIOTOMY Right 11/06/2012   Procedure: CRANIOTOMY TUMOR EXCISION;  Surgeon: Erline Levine, MD;  Location: [facility];  Service: Neurosurgery;  Laterality: Right;  Right Parasagittal craniotomy for meningioma with Stealth  . CRANIOTOMY Right 01/10/2019   Procedure: Right Parasagittal Craniotomy for Tumor;  Surgeon: Erline Levine, MD;  Location: [facility];  Service: Neurosurgery;  Laterality: Right;  Right parasagittal craniotomy for tumor  . ESOPHAGOGASTRODUODENOSCOPY    . right knee arthroscopy    . SHOULDER ARTHROSCOPY Right 06/09/2020   Procedure: RIGHT SHOULDER ARTHROSCOPY AND DEBRIDEMENT;  Surgeon: Newt Minion, MD;  Location: [facility];  Service: Orthopedics;  Laterality: Right;    Prior to Admission medications   Medication Sig Start Date End Date Taking? Authorizing Provider  Accu-Chek Softclix Lancets lancets  Use as instructed 07/08/20   Mayers, Cari S, PA-C  albuterol (VENTOLIN HFA) 108 (90 Base) MCG/ACT inhaler Inhale 2 puffs into the lungs every 6 (six) hours as needed for wheezing or shortness of breath. 09/06/20   Merlyn Lot, MD  amLODipine (NORVASC) 5 MG tablet Take 1 tablet (5 mg total) by mouth daily. To lower blood pressure 05/06/20   Fulp, Cammie, MD  aspirin 81 MG chewable  tablet     [provider]  atorvastatin (LIPITOR) 20 MG tablet TAKE 1 TABLET BY MOUTH EVERY DAY 08/27/20   Charlott Rakes, MD  benzonatate (TESSALON) 100 MG capsule Take 1 capsule (100 mg total) by mouth 2 (two) times daily as needed for cough. 09/10/20   Charlott Rakes, MD  clonazePAM (KLONOPIN) 0.5 MG tablet TAKE 1 TABLET BY MOUTH TWICE A DAY 08/25/20   Vaslow, Acey Lav, MD  glucose blood (ACCU-CHEK AVIVA PLUS) test strip Use as instructed 07/08/20   Mayers, Cari S, PA-C  HYDROcodone-acetaminophen (NORCO/VICODIN) 5-325 MG tablet Take 1 tablet by mouth every 8 (eight) hours as needed for moderate pain. 09/02/20   Persons, Bevely Palmer, PA  lamoTRIgine (LAMICTAL) 100 MG tablet TAKE 2 TABLETS BY MOUTH 2 TIMES DAILY. 08/25/20   Vaslow, Acey Lav, MD  methocarbamol (ROBAXIN) 500 MG tablet Take 1 tablet (500 mg total) by mouth every 8 (eight) hours as needed for muscle spasms. 04/22/20   Davonna Belling, MD  Misc. Devices MISC Please provide BP cuff for patient 07/08/20   Argentina Donovan, PA-C  ondansetron (ZOFRAN) 4 MG tablet Take 1 tablet (4 mg total) by mouth every 8 (eight) hours as needed for nausea or vomiting. 07/31/20   Swords, Darrick Penna, MD  pantoprazole (PROTONIX) 20 MG tablet Take 1 tablet (20 mg total) by mouth 2 (two) times daily before a meal. 8/33/82 11/19/37  Jodi Marble, MD  tamsulosin (FLOMAX) 0.4 MG CAPS capsule Take 1 capsule (0.4 mg total) by mouth daily. 07/23/20   Billey Co, MD  VIMPAT 100 MG TABS TAKE 2 TABLETS IN MORNING AND 2 AT BEDTIME 05/11/20   Vaslow, Acey Lav, MD    Allergies Patient has no known allergies.  Family History  Problem Relation Age of Onset  . Hypertension Mother   . Dementia Mother   . Diabetes Mother   . Hypertension Father   . CAD Father   . Diabetes Father   . CAD Brother     Social History Social History   Tobacco Use  . Smoking status: Former Smoker    Types: Cigarettes    Quit date: 01/29/2019    Years since quitting: 1.6   . Smokeless tobacco: Never Used  . Tobacco comment: weekend smoker  Vaping Use  . Vaping Use: Never used  Substance Use Topics  . Alcohol use: Not Currently    Comment: social  . Drug use: No    Review of Systems  Review of Systems  Constitutional: Negative for chills and fever.  HENT: Negative for sore throat.   Eyes: Negative for pain.  Respiratory: Positive for cough and shortness of breath. Negative for stridor.   Cardiovascular: Positive for chest pain.  Gastrointestinal: Negative for vomiting.  Skin: Negative for rash.  Neurological: Negative for seizures, loss of consciousness and headaches.  Psychiatric/Behavioral: Negative for suicidal ideas.  All other systems reviewed and are negative.     ____________________________________________   PHYSICAL EXAM:  VITAL SIGNS: ED Triage Vitals  Enc Vitals Group     BP  09/21/20 1018 118/72     Pulse Rate 09/21/20 1018 79     Resp 09/21/20 1254 16     Temp 09/21/20 1018 98.5 F (36.9 C)     Temp Source 09/21/20 1018 Oral     SpO2 09/21/20 1254 98 %     Weight 09/21/20 1013 180 lb 12.4 oz (82 kg)     Height 09/21/20 1013 5\' 8"  (1.727 m)     Head Circumference --      Peak Flow --      Pain Score 09/21/20 1013 7     Pain Loc --      Pain Edu? --      Excl. in [place]? --    Vitals:   09/21/20 1018 09/21/20 1254  BP: 118/72 (!) 146/99  Pulse: 79 69  Resp:  16  Temp: 98.5 F (36.9 C)   SpO2:  98%   Physical Exam Vitals and nursing note reviewed.  Constitutional:      Appearance: He is well-developed and well-nourished.  HENT:     Head: Normocephalic and atraumatic.     Right Ear: External ear normal.     Nose: Nose normal.     Mouth/Throat:     Mouth: Mucous membranes are moist.  Eyes:     Conjunctiva/sclera: Conjunctivae normal.  Cardiovascular:     Rate and Rhythm: Normal rate and regular rhythm.     Heart sounds: No murmur heard.   Pulmonary:     Effort: Pulmonary effort is normal. No respiratory  distress.     Breath sounds: Wheezing ( minimal end expiratory wheeze) present. No decreased breath sounds.  Abdominal:     Palpations: Abdomen is soft.     Tenderness: There is no abdominal tenderness. There is no right CVA tenderness or left CVA tenderness.  Musculoskeletal:        General: No edema.     Cervical back: Neck supple.     Right lower leg: No edema.     Left lower leg: No edema.  Skin:    General: Skin is warm and dry.     Capillary Refill: Capillary refill takes less than 2 seconds.  Neurological:     Mental Status: He is alert and oriented to person, place, and time.  Psychiatric:        Mood and Affect: Mood and affect and mood normal.      ____________________________________________   LABS (all labs ordered are listed, but only abnormal results are displayed)  Labs Reviewed  BASIC METABOLIC PANEL - Abnormal; Notable for the following components:      Result Value   Glucose, Bld 137 (*)    All other components within normal limits  CBC  HEPATIC FUNCTION PANEL  LIPASE, BLOOD  TROPONIN I (HIGH SENSITIVITY)  TROPONIN I (HIGH SENSITIVITY)   ____________________________________________  EKG  Sinus rhythm with a ventricular of 78, normal axis, unremarkable intervals, incomplete right bundle branch block and left posterior fascicle block without other clearance of acute ischemia or other significant delay arrhythmia. ____________________________________________  RADIOLOGY  ED MD interpretation: Slight atelectasis at left base without clear for consolidation, overt edema, effusion, pneumothorax or other clear acute intrathoracic process.   Official radiology report(s): DG Chest 2 View  Result Date: 09/21/2020 CLINICAL DATA:  Shortness of breath and chest pain EXAM: CHEST - 2 VIEW COMPARISON:  September 10, 2020 FINDINGS: There is slight atelectasis in the left base. The lungs elsewhere are clear. Heart size and pulmonary  vascularity are normal. No  adenopathy. No pneumothorax. No bone lesions. IMPRESSION: Slight left base atelectasis. Lungs otherwise clear. Heart size normal. Aortic Atherosclerosis (ICD10-I70.0). Electronically Signed   By: Lowella Grip III M.D.   On: 09/21/2020 10:40    ____________________________________________   PROCEDURES  Procedure(s) performed (including Critical Care):  .1-3 Lead EKG Interpretation Performed by: Lucrezia Starch, MD Authorized by: Lucrezia Starch, MD     Interpretation: normal     ECG rate assessment: normal     Rhythm: sinus rhythm     Ectopy: none       ____________________________________________   INITIAL IMPRESSION / ASSESSMENT AND PLAN / ED COURSE      Patient presents with above to history exam for assessment of some cough, shortness of breath, substernal chest discomfort rating from the epigastrium that has been on and off over the last month but may be a little worse today.  On arrival he is afebrile hemodynamically stable.  Prior differential includes esophagitis/GERD, pneumonia, ACS, PE, arrhythmia, symptomatic anemia, metabolic derangements, pancreatitis and cholecystitis.  Patient has no fever elevated white blood cell count or findings on chest x-ray to suggest pneumonia.  While ECG has some nonspecific changes given nonelevated troponins x2 have very low suspicion for ACS or myocarditis.  Low suspicion for PE as patient has no tachycardia, hypoxia, tachypnea or increased work of breathing on exam and no clear risk factors.  In addition to his overall description of symptoms is much more consistent with possible GI etiology.  However he did have a little bit of an expiratory wheeze on exam and given possible COPD history he was given albuterol inhaler to take home as he states he is almost out of his inhaler he already has.  Chest x-ray has no evidence of pneumonia or other acute intrathoracic abnormalities.  Lipase is not consistent with acute pancreatitis and  hepatic function panel is unremarkable and overall no right upper quadrant tenderness or other historical or exam findings to suggest cholecystitis.  CBC shows no leukocytosis or acute anemia and BMP shows no significant electrolyte or metabolic derangements.  Given stable vitals with otherwise reassuring exam and work-up and duration of symptoms of approximately 1 month believe patient safe for discharge plan for continued outpatient PCP and GI evaluation.  Discharged stable condition.  Strict return precautions advised and discussed.           ____________________________________________   FINAL CLINICAL IMPRESSION(S) / ED DIAGNOSES  Final diagnoses:  SOB (shortness of breath)  Chest pain, unspecified type    Medications  albuterol (VENTOLIN HFA) 108 (90 Base) MCG/ACT inhaler 1-2 puff (has no administration in time range)     ED Discharge Orders    None       Note:  This document was prepared using Dragon voice recognition software and may include unintentional dictation errors.   Lucrezia Starch, MD 09/21/20 1356

## 2020-09-21 NOTE — ED Triage Notes (Signed)
C/O SOB, lower abdominal pain that radiates up toward chest.  Symptoms have been ongoing x 2 weeks.  States last night had to take a pain pill to help pain (Hydrocodone), which was effective.  AAOx3.  Skin warm and dry . NAD

## 2020-09-22 ENCOUNTER — Telehealth: Payer: Self-pay | Admitting: *Deleted

## 2020-09-22 ENCOUNTER — Encounter: Payer: Medicaid Other | Admitting: Physical Therapy

## 2020-09-22 NOTE — Telephone Encounter (Signed)
Transition Care Management Unsuccessful Follow-up Telephone Call  Date of discharge and from where:  09/21/2020 [facility] ED  Attempts:  1st Attempt  Reason for unsuccessful TCM follow-up call:  Left voice message

## 2020-09-23 ENCOUNTER — Ambulatory Visit: Payer: Medicaid Other

## 2020-09-23 NOTE — Telephone Encounter (Signed)
Transition Care Management Follow-up Telephone Call  Date of discharge and from where: 09/21/2020 [facility] ED  How have you been since you were released from the hospital? "About the same"  Any questions or concerns? No  Items Reviewed:  Did the pt receive and understand the discharge instructions provided? Yes   Medications obtained and verified? Yes   Other? No   Any new allergies since your discharge? No   Dietary orders reviewed? Yes  Do you have support at home? Yes   Home Care and Equipment/Supplies: Were home health services ordered? not applicable If so, what is the name of the agency? N/A  Has the agency set up a time to come to the patient's home? not applicable Were any new equipment or medical supplies ordered?  No What is the name of the medical supply agency? N/A Were you able to get the supplies/equipment? not applicable Do you have any questions related to the use of the equipment or supplies? No  Functional Questionnaire: (I = Independent and D = Dependent) ADLs: I  Bathing/Dressing- I  Meal Prep- I  Eating- I  Maintaining continence- I  Transferring/Ambulation- I  Managing Meds- I  Follow up appointments reviewed:   PCP Hospital f/u appt confirmed? No  - pt states that he will call and make an appointment  Specialist Hospital f/u appt confirmed? No    Are transportation arrangements needed? No   If their condition worsens, is the pt aware to call PCP or go to the Emergency Dept.? Yes  Was the patient provided with contact information for the PCP's office or ED? Yes  Was to pt encouraged to call back with questions or concerns? Yes

## 2020-09-24 ENCOUNTER — Encounter: Payer: Medicaid Other | Admitting: Physical Therapy

## 2020-09-24 ENCOUNTER — Ambulatory Visit
Admission: RE | Admit: 2020-09-24 | Discharge: 2020-09-24 | Disposition: A | Discharge status: Home / Self Care | Payer: Medicaid Other | Source: Ambulatory Visit | Attending: Internal Medicine | Admitting: Internal Medicine

## 2020-09-24 DIAGNOSIS — C700 Malignant neoplasm of cerebral meninges: Secondary | ICD-10-CM

## 2020-09-24 DIAGNOSIS — D320 Benign neoplasm of cerebral meninges: Secondary | ICD-10-CM | POA: Diagnosis not present

## 2020-09-24 MED ORDER — GADOBENATE DIMEGLUMINE 529 MG/ML IV SOLN
19.0000 mL | Freq: Once | INTRAVENOUS | Status: AC | PRN
  Administered 2020-09-24: 19 mL via INTRAVENOUS

## 2020-09-25 ENCOUNTER — Other Ambulatory Visit: Payer: Self-pay | Admitting: Family Medicine

## 2020-09-25 ENCOUNTER — Ambulatory Visit: Payer: Medicaid Other | Attending: Otolaryngology | Admitting: Otolaryngology

## 2020-09-25 ENCOUNTER — Other Ambulatory Visit: Payer: Self-pay

## 2020-09-25 ENCOUNTER — Encounter: Payer: Self-pay | Admitting: Otolaryngology

## 2020-09-25 ENCOUNTER — Encounter: Payer: Medicaid Other | Attending: Physical Medicine & Rehabilitation | Admitting: Physical Medicine & Rehabilitation

## 2020-09-25 ENCOUNTER — Ambulatory Visit
Admission: RE | Admit: 2020-09-25 | Discharge: 2020-09-25 | Disposition: A | Discharge status: Home / Self Care | Payer: Medicaid Other | Source: Ambulatory Visit | Attending: Orthopedic Surgery | Admitting: Orthopedic Surgery

## 2020-09-25 ENCOUNTER — Other Ambulatory Visit: Payer: Self-pay | Admitting: Internal Medicine

## 2020-09-25 ENCOUNTER — Encounter: Payer: Self-pay | Admitting: Physical Medicine & Rehabilitation

## 2020-09-25 VITALS — BP 128/72 | HR 73 | Ht 68.0 in | Wt 199.8 lb

## 2020-09-25 VITALS — BP 129/78 | HR 89 | Temp 98.7°F | Ht 69.0 in | Wt 197.4 lb

## 2020-09-25 DIAGNOSIS — G8310 Monoplegia of lower limb affecting unspecified side: Secondary | ICD-10-CM

## 2020-09-25 DIAGNOSIS — C700 Malignant neoplasm of cerebral meninges: Secondary | ICD-10-CM

## 2020-09-25 DIAGNOSIS — M19011 Primary osteoarthritis, right shoulder: Secondary | ICD-10-CM | POA: Diagnosis not present

## 2020-09-25 DIAGNOSIS — N200 Calculus of kidney: Secondary | ICD-10-CM

## 2020-09-25 DIAGNOSIS — M25411 Effusion, right shoulder: Secondary | ICD-10-CM | POA: Diagnosis not present

## 2020-09-25 DIAGNOSIS — M7541 Impingement syndrome of right shoulder: Secondary | ICD-10-CM

## 2020-09-25 DIAGNOSIS — I700 Atherosclerosis of aorta: Secondary | ICD-10-CM

## 2020-09-25 DIAGNOSIS — M6258 Muscle wasting and atrophy, not elsewhere classified, other site: Secondary | ICD-10-CM | POA: Diagnosis not present

## 2020-09-25 NOTE — Progress Notes (Signed)
Subjective:    Patient ID: Joshua [name], male    DOB: Aug 25, 1960, 60 y.o.   MRN: [id number]  HPI  The patient was referred for the evaluation of right shoulder impingement.  Since the initial referral the patient has decided to undergo a reverse shoulder arthroplasty on the right side and has surgery pending. Chief complaint is bilateral lower extremity weakness 60 yo male with hx of bilateral meningioma with bilateral craniotomy who has primary complaints of bilateral leg weakness (L>R), his weakness is nonprogressive.  He has had MRI of the brain performed on 09/24/2020 and has a follow-up appointment with his neuro oncologist in about 1 month.  He has no new numbness or tingling in the lower extremities no back pain.  He does not have any neck pain.  He has some difficulty moving his left leg more so than the right side.  He used to have an AFO but did not find this to be comfortable and no longer wears this.  He has gone through outpatient rehab at [facility] neuro rehab.  He has not tried aquatic therapy No falls in 4-5 mo Mod I dressing and bathing  The patient underwent right shoulder arthroscopy and rotator cuff tear repair in November 2021.  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 Vertell Limber (WHO I) 01/10/19: Repeat craniotomy, debulking resection by Dr. Vertell Limber after tumor recurrence, seizures.  Path demonstrates islands of anaplasia c/w grade II/III. 04/05/19: Completes post-operative IMRT with Dr. Lisbeth Renshaw    Pain Inventory Average Pain 8 Pain Right Now 7 My pain is constant and stabbing  In the last 24 hours, has pain interfered with the following? General activity 8 Relation with others 9 Enjoyment of life 9 What TIME of day is your pain at its worst? morning , daytime, evening, night and varies Sleep (in general) Poor  Pain is worse with: walking, inactivity, standing and some activites Pain  improves with: medication and injections Relief from Meds: 9  use a cane how many minutes can you walk? 50 mins ability to climb steps?  yes do you drive?  yes Do you have any goals in this area?  yes  disabled: date disabled 2020 retired  weakness numbness spasms anxiety  Any changes since last visit?  yes [name] - Right Shoulder studies  Any changes since last visit?  no New Patient  Brain/CNS neoplasm. Evaluate treatment response. Malignant meningioma of manages of brain  EXAM: MRI HEAD WITHOUT AND WITH CONTRAST  TECHNIQUE: Multiplanar, multiecho pulse sequences of the brain and surrounding structures were obtained without and with intravenous contrast.  CONTRAST:  71mL MULTIHANCE GADOBENATE DIMEGLUMINE 529 MG/ML IV SOLN  COMPARISON:  MRI of the brain April 03, 2020. Head CT June 11, 2020.  FINDINGS: Brain: No acute infarction, hemorrhage or hydrocephalus.  Area of encephalomalacia in the right frontoparietal and left parietal regions appear stable. Dural thickening and contrast enhancement subjacent to the area of craniotomy and extending into the falx as well as posterior area of increased nodularity remain unchanged. 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 Confluent T2 hyperintensity of the periventricular white matter is also stable.  Vascular: Normal flow voids.  Skull and upper cervical spine: Postsurgical changes from biparietal craniotomy. Marrow signal characteristics are otherwise maintained.  Sinuses/Orbits: Bilateral lens surgery. Paranasal sinuses are clear.  Other: Minimal left mastoid effusion.  IMPRESSION: Stable postsurgical changes from biparietal craniotomy for meningioma resection. Stable  dural thickening and contrast enhancement subjacent to the area of craniotomy and extending into the falx as well as posterior area of increased nodularity. Chronically occluded superior sagittal  sinus at this level.   Electronically Signed   By: Pedro Earls M.D.   On: 09/24/2020 15:33   Family History  Problem Relation Age of Onset  . Hypertension Mother   . Dementia Mother   . Diabetes Mother   . Hypertension Father   . CAD Father   . Diabetes Father   . CAD Brother    Social History   Socioeconomic History  . Marital status: Legally Separated    Spouse name: Not on file  . Number of children: 2  . Years of education: Not on file  . Highest education level: Not on file  Occupational History  . Not on file  Tobacco Use  . Smoking status: Former Smoker    Types: Cigarettes    Quit date: 01/29/2019    Years since quitting: 1.6  . Smokeless tobacco: Never Used  . Tobacco comment: weekend smoker  Vaping Use  . Vaping Use: Never used  Substance and Sexual Activity  . Alcohol use: Not Currently    Comment: social  . Drug use: No  . Sexual activity: Not Currently  Other Topics Concern  . Not on file  Social History Narrative      Caffeine- soda occass   Social Determinants of Health   Financial Resource Strain: Not on file  Food Insecurity: Not on file  Transportation Needs: Not on file  Physical Activity: Not on file  Stress: Not on file  Social Connections: Not on file   Past Surgical History:  Procedure Laterality Date  . APPLICATION OF CRANIAL NAVIGATION N/A 01/10/2019   Procedure: APPLICATION OF CRANIAL NAVIGATION;  Surgeon: Erline Levine, MD;  Location: [place];  Service: Neurosurgery;  Laterality: N/A;  . CATARACT EXTRACTION Right   . CRANIOTOMY Right 11/06/2012   Procedure: CRANIOTOMY TUMOR EXCISION;  Surgeon: Erline Levine, MD;  Location: [place] [facility];  Service: Neurosurgery;  Laterality: Right;  Right Parasagittal craniotomy for meningioma with [facility]  . CRANIOTOMY Right 01/10/2019   Procedure: Right Parasagittal Craniotomy for Tumor;  Surgeon: Erline Levine, MD;  Location: [place];  Service: Neurosurgery;  Laterality: Right;   Right parasagittal craniotomy for tumor  . ESOPHAGOGASTRODUODENOSCOPY    . right knee arthroscopy    . SHOULDER ARTHROSCOPY Right 06/09/2020   Procedure: RIGHT SHOULDER ARTHROSCOPY AND DEBRIDEMENT;  Surgeon: Newt Minion, MD;  Location: [place];  Service: Orthopedics;  Laterality: Right;   Past Medical History:  Diagnosis Date  . Anxiety   . Brain tumor ([place]) 02/20/2013   brain tumor removed in March 2014, Dr Donald Pore  . Diffuse idiopathic skeletal hyperostosis 06/05/2013  . Dizziness   . Enlarged prostate   . GERD (gastroesophageal reflux disease)   . Headache(784.0)    scattered  . High cholesterol   . Hypertension   . 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[place]) 03/07/2019  . Meningioma ([place])   . Meningioma, recurrent of brain ([place]) 01/31/2019  . Neuropathy 12/05/2019  . Rotator cuff tear    right  . Seizures ([place])    11/27/19 last sz 1 wk ago   BP 129/78   Pulse 89   Temp 98.7 F (37.1 C)   Ht 5\' 9"  (1.753 m)   Wt 197 lb 6.4 oz (89.5 kg)   SpO2 95%   BMI  29.15 kg/m   Opioid Risk Score:   Fall Risk Score:  `1  Depression screen PHQ 2/9  Depression screen [facility] 2/9 09/25/2020 09/08/2020 05/06/2020 04/15/2020 03/20/2020 12/05/2019 11/26/2019  Decreased Interest 0 0 0 0 2 0 0  Down, Depressed, Hopeless 0 0 0 2 2 0 0  PHQ - 2 Score 0 0 0 2 4 0 0  Altered sleeping 0 0 - 3 3 0 0  Tired, decreased energy 0 0 - 0 3 0 0  Change in appetite 0 0 - 2 0 0 0  Feeling bad or failure about yourself  0 0 - 2 2 0 0  Trouble concentrating 0 0 - 1 2 0 0  Moving slowly or fidgety/restless 0 0 - 1 1 0 0  Suicidal thoughts - 0 - 0 0 0 0  PHQ-9 Score 0 0 - 11 15 0 0  Difficult doing work/chores - - - Somewhat difficult - - -  Some recent data might be hidden   Review of Systems  Musculoskeletal: Positive for gait problem.       Right shoulder pain  Pain in lower legs  All other systems reviewed and are negative.      Objective:   Physical Exam Vitals and  nursing note reviewed.  Constitutional:      Appearance: He is obese.  HENT:     Head: Normocephalic and atraumatic.  Eyes:     Extraocular Movements: Extraocular movements intact.     Conjunctiva/sclera: Conjunctivae normal.     Pupils: Pupils are equal, round, and reactive to light.  Cardiovascular:     Rate and Rhythm: Normal rate and regular rhythm.     Pulses: Normal pulses.     Heart sounds: Normal heart sounds. No murmur heard.   Pulmonary:     Effort: Pulmonary effort is normal. No respiratory distress.     Breath sounds: Normal breath sounds.  Abdominal:     General: Abdomen is flat. Bowel sounds are normal. There is no distension.     Palpations: Abdomen is soft.  Musculoskeletal:     Cervical back: Normal range of motion.     Right lower leg: No edema.     Left lower leg: No edema.     Comments: No pain with hip knee or ankle range of motion bilaterally.  No evidence of knee effusion bilaterally no evidence of ankle deformity or ankle effusion bilaterally Lumbar spine is no tenderness to palpation lumbar range of motion is 50% range forward flexion extension lateral bending and rotation  Skin:    General: Skin is warm and dry.  Neurological:     Mental Status: He is alert and oriented to person, place, and time.     Comments: Motor strength is 5/5 in the right grip 3 - at the deltoid limited by pain and range of motion, 4 - at the bicep tricep limited by right shoulder pain 5/5 strength in the left deltoid bicep tricep grip 4/5 bilateral hip flexors knee extensors and right ankle dorsiflexor, left ankle dorsiflexors 3-/5 Tone 3+ reflexes bilateral patellar 2 beat clonus right ankle 1 beat clonus left ankle With standing the patient has clawing of his second third and fourth toes.  He also has mild foot inversion with standing. With ambulation has mild foot inversion on the left side with poor heel strike  Psychiatric:        Mood and Affect: Mood normal.  Behavior: Behavior normal.           Assessment & Plan:  #1.  Left greater than right lower extremity weakness related to history of bilateral malignant meningiomas.  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 He is unable to get up off the ground on his own.  I do think he would benefit from strengthening program and I do think aquatic therapy may be a good option for him.  He is in favor of this. In regards to his spastic equinovarus would recommend botulinum toxin injection.  He already feels a bit drowsy from his seizure medications, would avoid sedating oral antispasticity medications.  #2.  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  Reviewed CT right shoulder results from 09/25/2020 the patient plans to follow-up with Dr. Marlou Sa for reverse shoulder arthroplasty.

## 2020-09-25 NOTE — Progress Notes (Signed)
Chief complaint: Chronic cough  History: 1 month return visit.  He is using the Protonix twice daily and stopped the sucralfate.  He feels like his reflux is ever better controlled.  May be also some improvement in the cough.  No chest pain or shortness of breath.  No real productivity.  He does not smoke.  No change in voice, dyspnea, or dysphagia.  An MRI of the head this week shows no sinus disease.  Recent chest x-ray was clear.  He has had no particular response to Zyrtec or Flonase.  Here recently, he has some intense right flank pain without shoulder or scrotal pain.  Shortly thereafter, he felt a burning sensation in the tip of the penis and the flank pain disappeared.  He has never had kidney stones prior.  Examination: He is stocky and energetic.  Mental status is sharp.  Voice is clear and respirations unlabored through the nose.  He is not coughing or clearing his throat unusually.  The head is atraumatic and neck supple.  Cranial nerves intact.  Ear canals are clear with normal drums.  Anterior nose is moist and patent.  Oral cavity is clear with teeth in good repair.  Oropharynx is clear.  I could not evaluate the larynx with mirror examination.  Neck without adenopathy.  No thyromegaly.  Impression: Chronic cough, possibly controlled on better reflux management.  Incomplete examination of the larynx.  Possible cough related to kidney stone with diaphragmatic irritation.  Plan: I do not have any signs of anything threatening going on.  I will continue antireflux management and see him back in 3 months.  I would like him to discuss the possible kidney stone issue with his primary care physician next time he sees them.

## 2020-09-25 NOTE — Patient Instructions (Signed)
WIll do botox injection as well as refer you to PT for aquatic therapy

## 2020-09-25 NOTE — Patient Instructions (Signed)
It sounds like we may have your reflux under better control finally.  This can cause cough, and may take several months to clear.  Continue with your Protonix twice daily.  I did not get an adequate look at your voice box with my mirror today.  If your symptoms persist, somebody will need to look at your vocal cords with the telescope, either an otolaryngologist, or a pulmonologist.  I do think you passed a kidney stone recently, with flank pain and pain upon urination.  Stay well-hydrated.  Avoid dark-colored drinks such as Coke, tea, coffee.  It is possible this also caused cough with irritation from the kidney against the diaphragm.  Your laboratory studies thus far including chest x-ray, MRI scan of your brain and sinuses are all basically normal.  I would like to check you back in 3 months, sooner as needed.

## 2020-09-25 NOTE — Progress Notes (Signed)
Having trouble swallowing.

## 2020-09-25 NOTE — Telephone Encounter (Signed)
  Notes to clinic: Patient has appointment today  Review for refill    Requested Prescriptions  Pending Prescriptions Disp Refills   atorvastatin (LIPITOR) 20 MG tablet [Pharmacy Med Name: ATORVASTATIN 20 MG TABLET] 30 tablet 0    Sig: TAKE 1 TABLET BY MOUTH EVERY DAY      Cardiovascular:  Antilipid - Statins Failed - 09/25/2020 12:31 PM      Failed - Total Cholesterol in normal range and within 360 days    No results found for: CHOL, POCCHOL, CHOLTOT        Failed - LDL in normal range and within 360 days    No results found for: LDLCALC, LDLC, HIRISKLDL, POCLDL, LDLDIRECT, REALLDLC, TOTLDLC        Failed - HDL in normal range and within 360 days    No results found for: HDL, POCHDL        Failed - Triglycerides in normal range and within 360 days    No results found for: TRIG, POCTRIG        Passed - Patient is not pregnant      Passed - Valid encounter within last 12 months    Recent Outpatient Visits           Today Kidney stone on right side   [place], Ileene Hutchinson, MD   2 weeks ago Prediabetes   [place], [place], MD   4 weeks ago Gastroesophageal reflux disease with esophagitis without hemorrhage   [place] Jodi Marble, MD   1 month ago Prediabetes   [place], Darrick Penna, MD   2 months ago Post-void dribbling   [place], [place]       Future Appointments             Today K[place], Luanna Salk, MD [facility]   In 2 months [place], [facility]

## 2020-09-26 ENCOUNTER — Inpatient Hospital Stay: Admission: RE | Admit: 2020-09-26 | Payer: Medicaid Other | Source: Ambulatory Visit

## 2020-09-28 ENCOUNTER — Ambulatory Visit: Payer: Medicaid Other

## 2020-09-28 ENCOUNTER — Inpatient Hospital Stay: Payer: Medicaid Other | Attending: Internal Medicine

## 2020-09-28 ENCOUNTER — Telehealth: Payer: Self-pay | Admitting: Internal Medicine

## 2020-09-28 ENCOUNTER — Other Ambulatory Visit: Payer: Self-pay | Admitting: Internal Medicine

## 2020-09-28 MED ORDER — VIMPAT 100 MG PO TABS: ORAL_TABLET | ORAL | 2 refills | Status: DC

## 2020-09-28 NOTE — Telephone Encounter (Signed)
Scheduled per 03/14 scheduled message, patient is scheduled to see provider on 03/15. Patient Is notified.

## 2020-09-29 ENCOUNTER — Inpatient Hospital Stay (HOSPITAL_BASED_OUTPATIENT_CLINIC_OR_DEPARTMENT_OTHER): Payer: Medicaid Other | Admitting: Internal Medicine

## 2020-09-29 ENCOUNTER — Other Ambulatory Visit: Payer: Self-pay

## 2020-09-29 VITALS — BP 129/76 | HR 74 | Temp 97.7°F | Resp 14 | Ht 69.0 in | Wt 199.1 lb

## 2020-09-29 DIAGNOSIS — R569 Unspecified convulsions: Secondary | ICD-10-CM

## 2020-09-29 DIAGNOSIS — C700 Malignant neoplasm of cerebral meninges: Secondary | ICD-10-CM | POA: Diagnosis not present

## 2020-09-29 MED ORDER — CLONAZEPAM 0.5 MG PO TABS: 0.5000 mg | ORAL_TABLET | Freq: Every day | ORAL | 2 refills | Status: DC

## 2020-09-29 MED ORDER — VIMPAT 100 MG PO TABS: ORAL_TABLET | ORAL | 3 refills | Status: DC

## 2020-09-29 NOTE — Progress Notes (Signed)
[facility] at [facility] [place], [place] [address] [phone]   Interval Evaluation  Date of Service: 09/29/20 Patient Name: Joshua [name] Patient MRN: [id number] Patient DOB: 1960-12-29 Provider: [name], MD  Identifying Statement:  Joshua [name] is a 60 y.o. 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 Vertell Limber (WHO I) 01/10/19: Repeat craniotomy, debulking resection by Dr. Vertell Limber after tumor recurrence, seizures.  Path demonstrates islands of anaplasia c/w grade II/III. 04/05/19: Completes post-operative IMRT with Dr. Lisbeth Renshaw  Interval History:  Joshua [name] presents today for follow up after recent MRI brain. Continues to be seizure and aura free over the past several months.  Still no utilizing any ativan at this time.  He otherwise maintains compliance with Vimpat, Lamictal, Klonopin.  Has upcoming shoulder surgery planned, also needing botox injection in left lower leg for spasticity.   H+P (02/05/19) Patient presents to review clinical course and care for his meningioma.  Initially he presented in 2014 with headache syndrome, was found to have tumor on imaging which was resected by Dr. Vertell Limber.  The patient was lost to follow up for ~5 years until he developed seizures, described as "twitching of left leg spreading to arm" this past month.  He required loads of Keppra and Dilantin to break events.  MRI demonstrated significant regrowth of the mass, and repeat craniotomy was performed on 01/10/19.  Small amount of residual tumor was visible on post-operative MRI.  Since surgery he has continued to experience numbness and clumsiness of his left leg, requiring cane for ambulation.  He is still pending home physical therapy evaluation.  He has had "small" seizures, consisting of few seconds of shaking of left leg, occurring less than daily.   Continues on Keppra and Dilantin.    Medications: Current Outpatient Medications on File Prior to Visit  Medication Sig Dispense Refill  . Accu-Chek Softclix Lancets lancets Use as instructed 100 each 12  . albuterol (VENTOLIN HFA) 108 (90 Base) MCG/ACT inhaler Inhale 2 puffs into the lungs every 6 (six) hours as needed for wheezing or shortness of breath. 8 g 2  . amLODipine (NORVASC) 5 MG tablet Take 1 tablet (5 mg total) by mouth daily. To lower blood pressure 30 tablet 4  . aspirin 81 MG chewable tablet     . atorvastatin (LIPITOR) 20 MG tablet TAKE 1 TABLET BY MOUTH EVERY DAY 90 tablet 1  . benzonatate (TESSALON) 100 MG capsule Take 1 capsule (100 mg total) by mouth 2 (two) times daily as needed for cough. 20 capsule 0  . clonazePAM (KLONOPIN) 0.5 MG tablet TAKE 1 TABLET BY MOUTH TWICE A DAY 60 tablet 3  . glucose blood (ACCU-CHEK AVIVA PLUS) test strip Use as instructed 100 each 12  . HYDROcodone-acetaminophen (NORCO/VICODIN) 5-325 MG tablet Take 1 tablet by mouth every 8 (eight) hours as needed for moderate pain. 30 tablet 0  . Lacosamide (VIMPAT) 100 MG TABS TAKE 2 TABLETS IN MORNING AND 2 AT BEDTIME 60 tablet 2  . lamoTRIgine (LAMICTAL) 100 MG tablet TAKE 2 TABLETS BY MOUTH TWICE A DAY 120 tablet 0  . Misc. Devices MISC Please provide BP cuff for patient 1 each 0  . ondansetron (ZOFRAN) 4 MG tablet Take 1 tablet (4 mg total) by mouth every 8 (eight) hours as needed for nausea or vomiting. 20 tablet 0  . pantoprazole (PROTONIX) 20 MG tablet  Take 1 tablet (20 mg total) by mouth 2 (two) times daily before a meal. 60 tablet 3  . tamsulosin (FLOMAX) 0.4 MG CAPS capsule Take 1 capsule (0.4 mg total) by mouth daily. 90 capsule 3   No current facility-administered medications on file prior to visit.    Allergies: No Known Allergies Past Medical History:  Past Medical History:  Diagnosis Date  . Anxiety   . Brain tumor ([place]) 02/20/2013   brain tumor removed in March 2014, Dr Donald Pore  .  Diffuse idiopathic skeletal hyperostosis 06/05/2013  . Dizziness   . Enlarged prostate   . GERD (gastroesophageal reflux disease)   . Headache(784.0)    scattered  . High cholesterol   . Hypertension   . 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[place]) 03/07/2019  . Meningioma ([place])   . Meningioma, recurrent of brain ([place]) 01/31/2019  . Neuropathy 12/05/2019  . Rotator cuff tear    right  . Seizures ([place])    11/27/19 last sz 1 wk ago   Past Surgical History:  Past Surgical History:  Procedure Laterality Date  . APPLICATION OF CRANIAL NAVIGATION N/A 01/10/2019   Procedure: APPLICATION OF CRANIAL NAVIGATION;  Surgeon: Erline Levine, MD;  Location: [facility];  Service: Neurosurgery;  Laterality: N/A;  . CATARACT EXTRACTION Right   . CRANIOTOMY Right 11/06/2012   Procedure: CRANIOTOMY TUMOR EXCISION;  Surgeon: Erline Levine, MD;  Location: [facility];  Service: Neurosurgery;  Laterality: Right;  Right Parasagittal craniotomy for meningioma with [facility]  . CRANIOTOMY Right 01/10/2019   Procedure: Right Parasagittal Craniotomy for Tumor;  Surgeon: Erline Levine, MD;  Location: [facility];  Service: Neurosurgery;  Laterality: Right;  Right parasagittal craniotomy for tumor  . ESOPHAGOGASTRODUODENOSCOPY    . right knee arthroscopy    . SHOULDER ARTHROSCOPY Right 06/09/2020   Procedure: RIGHT SHOULDER ARTHROSCOPY AND DEBRIDEMENT;  Surgeon: Newt Minion, MD;  Location: [facility];  Service: Orthopedics;  Laterality: Right;   Social History:  Social History   Socioeconomic History  . Marital status: Legally Separated    Spouse name: Not on file  . Number of children: 2  . Years of education: Not on file  . Highest education level: Not on file  Occupational History  . Not on file  Tobacco Use  . Smoking status: Former Smoker    Types: Cigarettes    Quit date: 01/29/2019    Years since quitting: 1.6  . Smokeless tobacco: Never Used  . Tobacco comment: weekend smoker  Vaping Use   . Vaping Use: Never used  Substance and Sexual Activity  . Alcohol use: Not Currently    Comment: social  . Drug use: No  . Sexual activity: Not Currently  Other Topics Concern  . Not on file  Social History Narrative      Caffeine- soda occass   Social Determinants of Health   Financial Resource Strain: Not on file  Food Insecurity: Not on file  Transportation Needs: Not on file  Physical Activity: Not on file  Stress: Not on file  Social Connections: Not on file  Intimate Partner Violence: Not on file   Family History:  Family History  Problem Relation Age of Onset  . Hypertension Mother   . Dementia Mother   . Diabetes Mother   . Hypertension Father   . CAD Father   . Diabetes Father   . CAD Brother     Review of Systems: Constitutional: Denies fevers, chills or abnormal weight  loss Eyes: Denies blurriness of vision Ears, nose, mouth, throat, and face: Denies mucositis or sore throat Respiratory: Denies cough, dyspnea or wheezes Cardiovascular: Denies palpitation, chest discomfort or lower extremity swelling Gastrointestinal:  Denies nausea, constipation, diarrhea GU: Denies dysuria or incontinence Skin: Denies abnormal skin rashes Neurological: Per HPI Musculoskeletal: Denies joint pain, back or neck discomfort. No decrease in ROM Behavioral/Psych: Denies anxiety, disturbance in thought content, and mood instability  Physical Exam: Vitals:   09/29/20 1157  BP: 129/76  Pulse: 74  Resp: 14  Temp: 97.7 F (36.5 C)  SpO2: 99%   KPS: 80. General: Alert, cooperative, pleasant, in no acute distress Head: Craniotomy scar noted, dry and intact. EENT: No conjunctival injection or scleral icterus. Oral mucosa moist Lungs: Resp effort normal Cardiac: Regular rate and rhythm Abdomen: Soft, non-distended abdomen Skin: No rashes cyanosis or petechiae. Extremities: No clubbing or edema  Neurologic Exam: Mental Status: Awake, alert, attentive to examiner.  Oriented to self and environment. Language is fluent with intact comprehension.  Cranial Nerves: Visual acuity is grossly normal. Visual fields are full. Extra-ocular movements intact. No ptosis. Face is symmetric, tongue midline. Motor: Tone and bulk are normal.Power is impaired in left leg, 4+/5 distally. Reflexes are symmetric, no pathologic reflexes present. Impaired heel to shin left leg Sensory: Stocking sensory loss Gait: Cane assisted  Labs: I have reviewed the data as listed    Component Value Date/Time   NA 139 09/21/2020 1015   NA 144 04/15/2020 1515   K 4.0 09/21/2020 1015   CL 105 09/21/2020 1015   CO2 26 09/21/2020 1015   GLUCOSE 137 (H) 09/21/2020 1015   BUN 11 09/21/2020 1015   BUN 13 04/15/2020 1515   CREATININE 0.83 09/21/2020 1015   CALCIUM 9.2 09/21/2020 1015   PROT 7.0 09/21/2020 1256   PROT 7.0 04/15/2020 1515   ALBUMIN 4.2 09/21/2020 1256   ALBUMIN 4.7 04/15/2020 1515   AST 24 09/21/2020 1256   ALT 27 09/21/2020 1256   ALKPHOS 76 09/21/2020 1256   BILITOT 0.5 09/21/2020 1256   BILITOT <0.2 04/15/2020 1515   GFRNONAA >60 09/21/2020 1015   GFRAA >60 04/17/2020 1313   Lab Results  Component Value Date   WBC 6.2 09/21/2020   NEUTROABS 3.4 03/10/2020   HGB 14.2 09/21/2020   HCT 42.0 09/21/2020   MCV 90.1 09/21/2020   PLT 273 09/21/2020   Imaging:  [facility] Clinician Interpretation: I have personally reviewed the CNS images as listed.  My interpretation, in the context of the patient's clinical presentation, is stable disease  DG Chest 2 View  Result Date: 09/21/2020 CLINICAL DATA:  Shortness of breath and chest pain EXAM: CHEST - 2 VIEW COMPARISON:  September 10, 2020 FINDINGS: There is slight atelectasis in the left base. The lungs elsewhere are clear. Heart size and pulmonary vascularity are normal. No adenopathy. No pneumothorax. No bone lesions. IMPRESSION: Slight left base atelectasis. Lungs otherwise clear. Heart size normal. Aortic Atherosclerosis  (ICD10-I70.0). Electronically Signed   By: Lowella Grip III M.D.   On: 09/21/2020 10:40   DG Chest 2 View  Result Date: 09/10/2020 CLINICAL DATA:  Cough for 6 months. EXAM: CHEST - 2 VIEW COMPARISON:  09/06/2020 FINDINGS: The heart size and mediastinal contours are within normal limits. Aortic atherosclerotic calcification noted. Both lungs are clear. The visualized skeletal structures are unremarkable. IMPRESSION: No active cardiopulmonary disease. Electronically Signed   By: Marlaine Hind M.D.   On: 09/10/2020 19:07   DG Chest 2 View  Result Date: 09/06/2020 CLINICAL DATA:  60 year old male with chest pain and shortness of breath EXAM: CHEST - 2 VIEW COMPARISON:  07/15/2020 FINDINGS: Cardiomediastinal silhouette unchanged. No pneumothorax. No pleural effusion. Coarsened interstitial markings similar to the prior. No confluent airspace disease. Degenerative changes spine. No displaced fracture. IMPRESSION: Chronic lung changes without evidence of acute cardiopulmonary disease Electronically Signed   By: Corrie Mckusick D.O.   On: 09/06/2020 10:47   CT Angio Chest PE W and/or Wo Contrast  Result Date: 09/06/2020 CLINICAL DATA:  Chest pain and shortness of breath. Elevated D-dimer. EXAM: CT ANGIOGRAPHY CHEST WITH CONTRAST TECHNIQUE: Multidetector CT imaging of the chest was performed using the standard protocol during bolus administration of intravenous contrast. Multiplanar CT image reconstructions and MIPs were obtained to evaluate the vascular anatomy. CONTRAST:  75 mL OMNIPAQUE IOHEXOL 350 MG/ML SOLN COMPARISON:  PA and lateral chest today.  CT chest 03/12/2019. FINDINGS: Cardiovascular: Satisfactory opacification of the pulmonary arteries to the segmental level. No evidence of pulmonary embolism. Normal heart size. No pericardial effusion. Bovine type aortic arch incidentally noted. Mediastinum/Nodes: No enlarged mediastinal, hilar, or axillary lymph nodes. Thyroid gland, trachea, and esophagus  demonstrate no significant findings. Lungs/Pleura: Lungs are clear. No pleural effusion or pneumothorax. Upper Abdomen: Fatty infiltration of the liver is noted. Upper abdomen is otherwise negative. Musculoskeletal: No acute or focal abnormality. Review of the MIP images confirms the above findings. IMPRESSION: Negative for pulmonary embolus.  No acute disease. Fatty infiltration of the liver. Electronically Signed   By: Inge Rise M.D.   On: 09/06/2020 13:19   MR BRAIN W WO CONTRAST  Result Date: 09/24/2020 CLINICAL DATA:  Brain/CNS neoplasm. Evaluate treatment response. Malignant meningioma of manages of brain EXAM: MRI HEAD WITHOUT AND WITH CONTRAST TECHNIQUE: Multiplanar, multiecho pulse sequences of the brain and surrounding structures were obtained without and with intravenous contrast. CONTRAST:  37mL MULTIHANCE GADOBENATE DIMEGLUMINE 529 MG/ML IV SOLN COMPARISON:  MRI of the brain April 03, 2020. Head CT June 11, 2020. FINDINGS: Brain: No acute infarction, hemorrhage or hydrocephalus. Area of encephalomalacia in the right frontoparietal and left parietal regions appear stable. Dural thickening and contrast enhancement subjacent to the area of craniotomy and extending into the falx as well as posterior area of increased nodularity remain unchanged. 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 Confluent T2 hyperintensity of the periventricular white matter is also stable. Vascular: Normal flow voids. Skull and upper cervical spine: Postsurgical changes from biparietal craniotomy. Marrow signal characteristics are otherwise maintained. Sinuses/Orbits: Bilateral lens surgery. Paranasal sinuses are clear. Other: Minimal left mastoid effusion. IMPRESSION: Stable postsurgical changes from biparietal craniotomy for meningioma resection. Stable dural thickening and contrast enhancement subjacent to the area of craniotomy and extending into the falx as well as posterior area of  increased nodularity. 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 Electronically Signed   By: Pedro Earls M.D.   On: 09/24/2020 15:33   CT SHOULDER RIGHT WO CONTRAST  Result Date: 09/25/2020 CLINICAL DATA:  Chronic right shoulder pain. History of prior rotator cuff repair. EXAM: CT OF THE UPPER RIGHT EXTREMITY WITHOUT CONTRAST TECHNIQUE: Multidetector CT imaging of the upper right extremity was performed according to the standard protocol. COMPARISON:  MRI right shoulder dated May 01, 2020. FINDINGS: Bones/Joint/Cartilage Mild-to-moderate glenohumeral joint space narrowing with small marginal osteophytes and subchondral cysts. Unchanged moderate glenohumeral joint effusion extending into the subacromial/subdeltoid bursa. No acute fracture or dislocation. Mild arthropathy of the acromioclavicular joint.  Type II acromion.  Ligaments Ligaments are suboptimally evaluated by CT. Muscles and Tendons Unchanged mild supraspinatus and moderate infraspinatus muscle atrophy. Soft tissue No fluid collection or hematoma.  No soft tissue mass. IMPRESSION: 1. 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 2. Unchanged mild acromioclavicular osteoarthritis. 3. Unchanged mild supraspinatus and moderate infraspinatus muscle atrophy. Electronically Signed   By: Titus Dubin M.D.   On: 09/25/2020 15:33   Assessment/Plan 1. Meningioma, recurrent of brain ([place])  2. Seizures [facility])  Mr. Fults is clinically and radiographically stable today.  He is seizure free as well, now for several months consecutively.   Will recommend continuing LMG 200mg  BID, Vimpat 200mg  BID.   Klonopin should be decreased to 0.5mg  HS if tolerated.  This can be further titrated over the phone in 2 months as planned and discussed.  He will continue to work on anxiety and recent stressors through his counselor.   We ask that Joshua [name] return to clinic in 6 months  following next brain MRI, or sooner as needed.  Klonopin titration phone call will be in 2 months.  We appreciate the opportunity to participate in the care of Joshua [name].    All questions were answered. The patient knows to call the clinic with any problems, questions or concerns. No barriers to learning were detected.    I have spent a total of 30 minutes of face-to-face and non-face-to-face time, excluding clinical staff time, preparing to see patient, ordering tests and/or medications, counseling the patient, and care coordination    [name], MD Medical Director of Neuro-Oncology [facility] at [place] 09/29/20 11:38 AM

## 2020-10-01 ENCOUNTER — Other Ambulatory Visit: Payer: Self-pay | Admitting: Internal Medicine

## 2020-10-01 ENCOUNTER — Telehealth: Payer: Self-pay | Admitting: Family Medicine

## 2020-10-01 DIAGNOSIS — C700 Malignant neoplasm of cerebral meninges: Secondary | ICD-10-CM

## 2020-10-01 MED ORDER — CLONAZEPAM 0.5 MG PO TABS: 0.5000 mg | ORAL_TABLET | Freq: Every day | ORAL | 2 refills | Status: DC

## 2020-10-01 NOTE — Telephone Encounter (Signed)
Copied from [facility] [phone]. Topic: Appointment Scheduling - Scheduling Inquiry for Clinic >> Sep 29, 2020  1:52 PM Oneta Rack wrote: Patient is having shoulder surgery and requesting a referral or orders for assisting living. Patient states his current residency has stairs and would not be able to recover after surgery. Patient states he does not know the exact date of surgery but knows its coming up soon and this has to be done prior. Please follow up with patent regarding request, >> Sep 30, 2020 11:54 AM Erick Blinks wrote: [phone] Pt is requesting a call back from office, please advise

## 2020-10-02 ENCOUNTER — Other Ambulatory Visit: Payer: Self-pay | Admitting: Radiation Therapy

## 2020-10-02 NOTE — Telephone Encounter (Signed)
Noted referral will be placed once surgery date is confirmed.

## 2020-10-03 ENCOUNTER — Other Ambulatory Visit: Payer: Self-pay

## 2020-10-03 ENCOUNTER — Emergency Department
Admission: EM | Admit: 2020-10-03 | Discharge: 2020-10-04 | Disposition: A | Discharge status: Home / Self Care | Payer: Medicaid Other | Attending: Emergency Medicine | Admitting: Emergency Medicine

## 2020-10-03 ENCOUNTER — Encounter: Payer: Self-pay | Admitting: Radiology

## 2020-10-03 ENCOUNTER — Emergency Department: Payer: Medicaid Other

## 2020-10-03 DIAGNOSIS — K219 Gastro-esophageal reflux disease without esophagitis: Secondary | ICD-10-CM | POA: Diagnosis not present

## 2020-10-03 DIAGNOSIS — Z85841 Personal history of malignant neoplasm of brain: Secondary | ICD-10-CM | POA: Diagnosis not present

## 2020-10-03 DIAGNOSIS — R110 Nausea: Secondary | ICD-10-CM | POA: Diagnosis not present

## 2020-10-03 DIAGNOSIS — Z7982 Long term (current) use of aspirin: Secondary | ICD-10-CM | POA: Diagnosis not present

## 2020-10-03 DIAGNOSIS — R1031 Right lower quadrant pain: Secondary | ICD-10-CM | POA: Diagnosis not present

## 2020-10-03 DIAGNOSIS — I10 Essential (primary) hypertension: Secondary | ICD-10-CM | POA: Insufficient documentation

## 2020-10-03 DIAGNOSIS — Z87891 Personal history of nicotine dependence: Secondary | ICD-10-CM | POA: Insufficient documentation

## 2020-10-03 DIAGNOSIS — R109 Unspecified abdominal pain: Secondary | ICD-10-CM

## 2020-10-03 DIAGNOSIS — Z79899 Other long term (current) drug therapy: Secondary | ICD-10-CM | POA: Insufficient documentation

## 2020-10-03 LAB — COMPREHENSIVE METABOLIC PANEL
ALT: 24 U/L (ref 0–44)
AST: 18 U/L (ref 15–41)
Albumin: 4.4 g/dL (ref 3.5–5.0)
Alkaline Phosphatase: 71 U/L (ref 38–126)
Anion gap: 10 (ref 5–15)
BUN: 12 mg/dL (ref 6–20)
CO2: 23 mmol/L (ref 22–32)
Calcium: 9.1 mg/dL (ref 8.9–10.3)
Chloride: 107 mmol/L (ref 98–111)
Creatinine, Ser: 0.79 mg/dL (ref 0.61–1.24)
GFR, Estimated: >60 mL/min (ref >60)
Glucose, Bld: 97 mg/dL (ref 70–99)
Potassium: 3.8 mmol/L (ref 3.5–5.1)
Sodium: 140 mmol/L (ref 135–145)
Total Bilirubin: 0.6 mg/dL (ref 0.3–1.2)
Total Protein: 7.1 g/dL (ref 6.5–8.1)

## 2020-10-03 LAB — URINALYSIS, COMPLETE (UACMP) WITH MICROSCOPIC
Bacteria, UA: NONE SEEN
Bilirubin Urine: NEGATIVE
Glucose, UA: NEGATIVE mg/dL
Hgb urine dipstick: NEGATIVE
Ketones, ur: NEGATIVE mg/dL
Leukocytes,Ua: NEGATIVE
Nitrite: NEGATIVE
Protein, ur: NEGATIVE mg/dL
Specific Gravity, Urine: 1.034 — ABNORMAL HIGH (ref 1.005–1.030)
Squamous Epithelial / HPF: NONE SEEN (ref 0–5)
pH: 5 (ref 5.0–8.0)

## 2020-10-03 LAB — CBC
HCT: 42.9 % (ref 39.0–52.0)
Hemoglobin: 14.2 g/dL (ref 13.0–17.0)
MCH: 29.9 pg (ref 26.0–34.0)
MCHC: 33.1 g/dL (ref 30.0–36.0)
MCV: 90.3 fL (ref 80.0–100.0)
Platelets: 278 10*3/uL (ref 150–400)
RBC: 4.75 MIL/uL (ref 4.22–5.81)
RDW: 12.1 % (ref 11.5–15.5)
WBC: 7.3 10*3/uL (ref 4.0–10.5)
nRBC: 0 % (ref 0.0–0.2)

## 2020-10-03 LAB — LIPASE, BLOOD: Lipase: 32 U/L (ref 11–51)

## 2020-10-03 MED ORDER — ONDANSETRON HCL 4 MG/2ML IJ SOLN
4.0000 mg | Freq: Once | INTRAMUSCULAR | Status: AC
  Administered 2020-10-03: 4 mg via INTRAVENOUS
  Filled 2020-10-03: qty 2

## 2020-10-03 MED ORDER — HYDROMORPHONE HCL 1 MG/ML IJ SOLN
0.5000 mg | Freq: Once | INTRAMUSCULAR | Status: AC
  Administered 2020-10-03: 0.5 mg via INTRAVENOUS
  Filled 2020-10-03: qty 1

## 2020-10-03 MED ORDER — IOHEXOL 300 MG/ML  SOLN
100.0000 mL | Freq: Once | INTRAMUSCULAR | Status: AC | PRN
  Administered 2020-10-03: 100 mL via INTRAVENOUS

## 2020-10-03 MED ORDER — ACETAMINOPHEN 500 MG PO TABS
1000.0000 mg | ORAL_TABLET | Freq: Once | ORAL | Status: AC
  Administered 2020-10-03: 1000 mg via ORAL
  Filled 2020-10-03: qty 2

## 2020-10-03 NOTE — ED Triage Notes (Signed)
Pt states coming in with pain. Pt states a week ago he had groin pain then it moved to his abdomen and now it is his right flank. Pt denies swelling to the groin and burning with urination.  Pt states it also hurts "when I try to cough."

## 2020-10-03 NOTE — ED Provider Notes (Signed)
[facility] Emergency Department Provider Note  ____________________________________________   Event Date/Time   First MD Initiated Contact with Patient 10/03/20 2154     (approximate)  I have reviewed the triage vital signs and the nursing notes.   HISTORY  Chief Complaint Abdominal Pain    HPI Joshua [name] is a 60 y.o. male with prior brain tumor status post resection, seizures who comes in for abdominal pain.  He states that he has had 1 week of abdominal pain that is now moved more into his right flank but also into his right lower abdomen.  Pain was initially intermittent but now more constant, severe, nothing makes it better, nothing makes it worse.  He does report a little nausea as well with it that he is taking some medication at home does not know what is called.  He denies any swelling to his testicles or pain with urination.  He denies any chest pain or shortness of breath.          Past Medical History:  Diagnosis Date  . Anxiety   . Brain tumor ([place]) 02/20/2013   brain tumor removed in March 2014, Dr Donald Pore  . Diffuse idiopathic skeletal hyperostosis 06/05/2013  . Dizziness   . Enlarged prostate   . GERD (gastroesophageal reflux disease)   . Headache(784.0)    scattered  . High cholesterol   . Hypertension   . 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 Meningioma ([place])   . Meningioma, recurrent of brain ([place]) 01/31/2019  . Neuropathy 12/05/2019  . Rotator cuff tear    right  . Seizures ([place])    11/27/19 last sz 1 wk ago    Patient Active Problem List   Diagnosis Date Noted  . Essential hypertension 07/02/2020  . Prediabetes 07/02/2020  . Polyuria 07/02/2020  . Nontraumatic complete tear of right rotator cuff   . Impingement syndrome of right shoulder   . Neuropathy 12/05/2019  . 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 Seizures ([place]) 01/31/2019  . Meningioma, recurrent of brain  ([place]) 01/31/2019  . Meningioma ([place]) 01/03/2019  . Diffuse idiopathic skeletal hyperostosis 06/05/2013    Past Surgical History:  Procedure Laterality Date  . APPLICATION OF CRANIAL NAVIGATION N/A 01/10/2019   Procedure: APPLICATION OF CRANIAL NAVIGATION;  Surgeon: Erline Levine, MD;  Location: [place];  Service: Neurosurgery;  Laterality: N/A;  . CATARACT EXTRACTION Right   . CRANIOTOMY Right 11/06/2012   Procedure: CRANIOTOMY TUMOR EXCISION;  Surgeon: Erline Levine, MD;  Location: [place] [facility];  Service: Neurosurgery;  Laterality: Right;  Right Parasagittal craniotomy for meningioma with Stealth  . CRANIOTOMY Right 01/10/2019   Procedure: Right Parasagittal Craniotomy for Tumor;  Surgeon: Erline Levine, MD;  Location: [place];  Service: Neurosurgery;  Laterality: Right;  Right parasagittal craniotomy for tumor  . ESOPHAGOGASTRODUODENOSCOPY    . right knee arthroscopy    . SHOULDER ARTHROSCOPY Right 06/09/2020   Procedure: RIGHT SHOULDER ARTHROSCOPY AND DEBRIDEMENT;  Surgeon: Newt Minion, MD;  Location: [place];  Service: Orthopedics;  Laterality: Right;    Prior to Admission medications   Medication Sig Start Date End Date Taking? Authorizing Provider  Accu-Chek Softclix Lancets lancets Use as instructed 07/08/20   Mayers, Cari S, PA-C  albuterol (VENTOLIN HFA) 108 (90 Base) MCG/ACT inhaler Inhale 2 puffs into the lungs every 6 (six) hours as needed for wheezing or shortness of breath. 09/06/20   Merlyn Lot, MD  amLODipine (NORVASC) 5 MG tablet Take 1 tablet (5 mg total) by mouth daily. To lower blood pressure 05/06/20   Fulp, Cammie, MD  aspirin 81 MG chewable tablet     [provider]  atorvastatin (LIPITOR) 20 MG tablet TAKE 1 TABLET BY MOUTH EVERY DAY 09/25/20   Charlott Rakes, MD  benzonatate (TESSALON) 100 MG capsule Take 1 capsule (100 mg total) by mouth 2 (two) times daily as needed for cough. 09/10/20   Charlott Rakes, MD  clonazePAM (KLONOPIN) 0.5  MG tablet Take 1 tablet (0.5 mg total) by mouth at bedtime. 10/01/20   [name], MD  glucose blood (ACCU-CHEK AVIVA PLUS) test strip Use as instructed 07/08/20   Mayers, Cari S, PA-C  HYDROcodone-acetaminophen (NORCO/VICODIN) 5-325 MG tablet Take 1 tablet by mouth every 8 (eight) hours as needed for moderate pain. Patient not taking: Reported on 09/29/2020 09/02/20   Persons, Bevely Palmer, [place]  Lacosamide (VIMPAT) 100 MG TABS TAKE 2 TABLETS IN MORNING AND 2 AT BEDTIME 09/29/20   [name], MD  lamoTRIgine (LAMICTAL) 100 MG tablet TAKE 2 TABLETS BY MOUTH TWICE A DAY 09/25/20   Vaslow, Acey Lav, MD  Misc. Devices MISC Please provide BP cuff for patient 07/08/20   Argentina Donovan, PA-C  ondansetron (ZOFRAN) 4 MG tablet Take 1 tablet (4 mg total) by mouth every 8 (eight) hours as needed for nausea or vomiting. 07/31/20   Swords, Darrick Penna, MD  pantoprazole (PROTONIX) 20 MG tablet Take 1 tablet (20 mg total) by mouth 2 (two) times daily before a meal. 2/70/35 0/09/38  Jodi Marble, MD  tamsulosin (FLOMAX) 0.4 MG CAPS capsule Take 1 capsule (0.4 mg total) by mouth daily. 07/23/20   Billey Co, MD    Allergies Patient has no known allergies.  Family History  Problem Relation Age of Onset  . Hypertension Mother   . Dementia Mother   . Diabetes Mother   . Hypertension Father   . CAD Father   . Diabetes Father   . CAD Brother     Social History Social History   Tobacco Use  . Smoking status: Former Smoker    Types: Cigarettes    Quit date: 01/29/2019    Years since quitting: 1.6  . Smokeless tobacco: Never Used  . Tobacco comment: weekend smoker  Vaping Use  . Vaping Use: Never used  Substance Use Topics  . Alcohol use: Not Currently    Comment: social  . Drug use: No      Review of Systems Constitutional: No fever/chills Eyes: No visual changes. ENT: No sore throat. Cardiovascular: Denies chest pain. Respiratory: Denies shortness of breath. Gastrointestinal:  Positive abdominal pain, no vomiting Genitourinary: Negative for dysuria. Musculoskeletal: Negative for back pain. Skin: Negative for rash. Neurological: Negative for headaches, focal weakness or numbness. All other ROS negative ____________________________________________   PHYSICAL EXAM:  VITAL SIGNS: ED Triage Vitals  Enc Vitals Group     BP 10/03/20 1800 130/82     Pulse Rate 10/03/20 1800 98     Resp 10/03/20 1800 16     Temp 10/03/20 1800 99.1 F (37.3 C)     Temp Source 10/03/20 1917 Oral     SpO2 10/03/20 1800 96 %     Weight 10/03/20 1800 185 lb (83.9 kg)     Height 10/03/20 1800 5\' 9"  (1.753 m)     Head Circumference --      Peak Flow --  Pain Score 10/03/20 1800 9     Pain Loc --      Pain Edu? --      Excl. in [place]? --     Constitutional: Alert and oriented. Well appearing and in no acute distress. Eyes: Conjunctivae are normal. EOMI. Head: Atraumatic. Nose: No congestion/rhinnorhea. Mouth/Throat: Mucous membranes are moist.   Neck: No stridor. Trachea Midline. FROM Cardiovascular: Normal rate, regular rhythm. Grossly normal heart sounds.  Good peripheral circulation. Respiratory: Normal respiratory effort.  No retractions. Lungs CTAB. Gastrointestinal: Soft but some slight tenderness to like R mid abd area. no distention. No abdominal bruits.  Musculoskeletal: No lower extremity tenderness nor edema.  No joint effusions. Neurologic:  Normal speech and language. No gross focal neurologic deficits are appreciated.  Skin:  Skin is warm, dry and intact. No rash noted. Psychiatric: Mood and affect are normal. Speech and behavior are normal. GU: Deferred   ____________________________________________   LABS (all labs ordered are listed, but only abnormal results are displayed)  Labs Reviewed  LIPASE, BLOOD  COMPREHENSIVE METABOLIC PANEL  CBC  URINALYSIS, COMPLETE (UACMP) WITH MICROSCOPIC   ____________________________________________   ED ECG  REPORT I, Vanessa [place], the attending physician, personally viewed and interpreted this ECG.  Normal sinus rate of 94, no ST elevation, no T wave inversion, normal intervals, incomplete right bundle branch block ____________________________________________  RADIOLOGY   Official radiology report(s): CT ABDOMEN PELVIS W CONTRAST  Result Date: 10/03/2020 CLINICAL DATA:  60 year old male with right lower quadrant abdominal pain. EXAM: CT ABDOMEN AND PELVIS WITH CONTRAST TECHNIQUE: Multidetector CT imaging of the abdomen and pelvis was performed using the standard protocol following bolus administration of intravenous contrast. CONTRAST:  133mL OMNIPAQUE IOHEXOL 300 MG/ML  SOLN COMPARISON:  CT abdomen pelvis dated 03/10/2020. FINDINGS: Lower chest: The visualized lung bases are clear. No intra-abdominal free air or free fluid. Hepatobiliary: Fatty infiltration of the liver. No intrahepatic biliary ductal dilatation. The gallbladder is unremarkable. Pancreas: Unremarkable. No pancreatic ductal dilatation or surrounding inflammatory changes. Spleen: Normal in size without focal abnormality. Adrenals/Urinary Tract: Stable small left adrenal nodule not characterized on this CT but previously described as adenoma. The right adrenal glands unremarkable. There is no hydronephrosis on either side. There is symmetric enhancement and excretion of contrast by both kidneys. The visualized ureters and urinary bladder appear unremarkable. Stomach/Bowel: There is no bowel obstruction or active inflammation. The appendix is normal. Vascular/Lymphatic: Mild aortoiliac atherosclerotic disease. The IVC is unremarkable. No portal venous gas. There is no adenopathy. Reproductive: The prostate and seminal vesicles are grossly unremarkable. No pelvic mass. Other: Bilateral hydroceles, right greater than left. Small fat containing umbilical hernia. Musculoskeletal: There is a total right hip arthroplasty. Degenerative changes of the  spine. No acute osseous pathology. IMPRESSION: 1. No acute intra-abdominal or pelvic pathology. No bowel obstruction. Normal appendix. 2. Fatty liver. 3. Aortic Atherosclerosis (ICD10-I70.0). Electronically Signed   By: Anner [place]te M.D.   On: 10/03/2020 23:10    ____________________________________________   PROCEDURES  Procedure(s) performed (including Critical Care):  Procedures   ____________________________________________   INITIAL IMPRESSION / ASSESSMENT AND PLAN / ED COURSE  Joshua [name] was evaluated in Emergency Department on 10/03/2020 for the symptoms described in the history of present illness. He was evaluated in the context of the global COVID-19 pandemic, which necessitated consideration that the patient might be at risk for infection with the SARS-CoV-2 virus that causes COVID-19. Institutional protocols and algorithms that pertain to the evaluation of patients at risk  for COVID-19 are in a state of rapid change based on information released by regulatory bodies including the CDC and federal and state organizations. These policies and algorithms were followed during the patient's care in the ED.    Patient is a 60 year old who comes in with right mid abdominal pain.  Patient states it is worse with twisting and movement but is not sure if he had any injuries.  Given patient's age will get CT scan to evaluate for appendicitis, SBO, kidney stone or other acute causes.  Will get urine to evaluate for UTI.  He does report a little dysuria a week ago but not sure if this is related or not.  He denies any chest pain or shortness of breath.  Will give 1 dose of IV Dilaudid and 1 dose of IV Zofran while awaiting CT results  Labs are reassuring.  No evidence of anemia.  No white count elevation to suggest infection.  CT scan was negative.  Patient understands that this time we will discharge him home and if his urine is positive will start on antibiotics prior to discharge at  this time he does not meet any criteria for admission and he is otherwise well-appearing with normal vital signs  I discussed the provisional nature of ED diagnosis, the treatment so far, the ongoing plan of care, follow up appointments and return precautions with the patient and any family or support people present. They expressed understanding and agreed with the plan, discharged home.    ____________________________________________   FINAL CLINICAL IMPRESSION(S) / ED DIAGNOSES   Final diagnoses:  Abdominal pain, unspecified abdominal location      MEDICATIONS GIVEN DURING THIS VISIT:  Medications  acetaminophen (TYLENOL) tablet 1,000 mg (has no administration in time range)  HYDROmorphone (DILAUDID) injection 0.5 mg (0.5 mg Intravenous Given 10/03/20 2207)  ondansetron (ZOFRAN) injection 4 mg (4 mg Intravenous Given 10/03/20 2205)  iohexol (OMNIPAQUE) 300 MG/ML solution 100 mL (100 mLs Intravenous Contrast Given 10/03/20 2238)     ED Discharge Orders    None       Note:  This document was prepared using Dragon voice recognition software and may include unintentional dictation errors.   Vanessa [place], MD 10/03/20 2329

## 2020-10-03 NOTE — Discharge Instructions (Addendum)
Your CT scan was negative.  You take Tylenol 1 g every 8 hours.  Return the ER if you develop worsening pain or any other concern

## 2020-10-05 ENCOUNTER — Other Ambulatory Visit: Payer: Self-pay | Admitting: Family Medicine

## 2020-10-05 ENCOUNTER — Telehealth: Payer: Self-pay

## 2020-10-05 DIAGNOSIS — R1312 Dysphagia, oropharyngeal phase: Secondary | ICD-10-CM | POA: Diagnosis not present

## 2020-10-05 DIAGNOSIS — K219 Gastro-esophageal reflux disease without esophagitis: Secondary | ICD-10-CM | POA: Diagnosis not present

## 2020-10-05 MED ORDER — AMLODIPINE BESYLATE 5 MG PO TABS: 5.0000 mg | ORAL_TABLET | Freq: Every day | ORAL | 4 refills | Status: DC

## 2020-10-05 NOTE — Telephone Encounter (Signed)
Copied from [place] [id number]. Topic: Quick Communication - Rx Refill/Question >> Oct 05, 2020 12:40 PM Mcneil, Jacinto Reap wrote: Pt stated he took his last pill today.  Medication: amLODipine (NORVASC) 5 MG tablet  Has the patient contacted their pharmacy? yes - Pt advised by pharmacy that they have not received a response to refill request so call pcp  Preferred Pharmacy (with phone number or street name): [id number] [place], [place] - [address]  Phone: [phone]  Fax: [phone]     Agent: Please be advised that RX refills may take up to 3 business days. We ask that you follow-up with your pharmacy.

## 2020-10-05 NOTE — Progress Notes (Signed)
Blue sheet done.  Please call patient t to schedule.  Thanks

## 2020-10-05 NOTE — Telephone Encounter (Signed)
Transition Care Management Unsuccessful Follow-up Telephone Call  Date of discharge and from where:  10/04/2020 from [facility]  Attempts:  1st Attempt  Reason for unsuccessful TCM follow-up call:  Unable to leave message

## 2020-10-06 ENCOUNTER — Ambulatory Visit: Payer: Medicaid Other | Admitting: Internal Medicine

## 2020-10-06 NOTE — Telephone Encounter (Signed)
Transition Care Management Unsuccessful Follow-up Telephone Call  Date of discharge and from where:  10/04/2020 [facility] ED  Attempts:  2nd Attempt  Reason for unsuccessful TCM follow-up call:  Unable to leave message

## 2020-10-07 NOTE — Telephone Encounter (Signed)
Transition Care Management Follow-up Telephone Call  Date of discharge and from where: 10/03/2020 from [facility]  How have you been since you were released from the hospital? Pt stated that he is feeling better since he was in the ED.  Any questions or concerns? No  Items Reviewed:  Did the pt receive and understand the discharge instructions provided? Yes   Medications obtained and verified? Yes   Other? No   Any new allergies since your discharge? No   Dietary orders reviewed? n/a  Do you have support at home? Yes   Functional Questionnaire: (I = Independent and D = Dependent) ADLs: I  Bathing/Dressing- I  Meal Prep- I  Eating- I  Maintaining continence- I  Transferring/Ambulation- I  Managing Meds- I   Follow up appointments reviewed:   PCP Hospital f/u appt confirmed? No    Specialist Hospital f/u appt confirmed? No    Are transportation arrangements needed? No   If their condition worsens, is the pt aware to call PCP or go to the Emergency Dept.? Yes  Was the patient provided with contact information for the PCP's office or ED? Yes  Was to pt encouraged to call back with questions or concerns? Yes

## 2020-10-08 ENCOUNTER — Ambulatory Visit: Payer: Medicaid Other | Admitting: Physical Therapy

## 2020-10-09 ENCOUNTER — Telehealth: Payer: Self-pay | Admitting: Orthopedic Surgery

## 2020-10-09 NOTE — Telephone Encounter (Signed)
Patient called to discuss surgery date for right reverse shoulder arthroplasty. I have spoken with the [employer] and the implant should be available 11/03/20 or after.  Most likely this patient will have a surgery date for early May. Patient is requesting something for the pain.  He states he is homeless and had been living out of his truck.  He has a temporary place to stay until the end of March, but does not know where he will be staying in April or May.  Patient's cb  [phone]

## 2020-10-09 NOTE — Telephone Encounter (Signed)
FYI

## 2020-10-12 ENCOUNTER — Other Ambulatory Visit: Payer: Self-pay | Admitting: Surgical

## 2020-10-12 ENCOUNTER — Telehealth: Payer: Self-pay | Admitting: *Deleted

## 2020-10-12 MED ORDER — TRAMADOL HCL 50 MG PO TABS: 50.0000 mg | ORAL_TABLET | Freq: Two times a day (BID) | ORAL | 0 refills | Status: DC | PRN

## 2020-10-12 NOTE — Telephone Encounter (Signed)
IC LMVM advising.  

## 2020-10-12 NOTE — Telephone Encounter (Signed)
Left VM asing patient if he would like to come to [facility] for therapy.

## 2020-10-12 NOTE — Telephone Encounter (Signed)
Sent in RX for Tramadol, do not take while driving

## 2020-10-16 ENCOUNTER — Other Ambulatory Visit: Payer: Self-pay

## 2020-10-16 ENCOUNTER — Emergency Department: Payer: Medicaid Other

## 2020-10-16 ENCOUNTER — Emergency Department
Admission: EM | Admit: 2020-10-16 | Discharge: 2020-10-17 | Discharge status: Against Medical Advice | Payer: Medicaid Other | Attending: Emergency Medicine | Admitting: Emergency Medicine

## 2020-10-16 DIAGNOSIS — Z85841 Personal history of malignant neoplasm of brain: Secondary | ICD-10-CM | POA: Insufficient documentation

## 2020-10-16 DIAGNOSIS — Z87891 Personal history of nicotine dependence: Secondary | ICD-10-CM | POA: Insufficient documentation

## 2020-10-16 DIAGNOSIS — I6782 Cerebral ischemia: Secondary | ICD-10-CM | POA: Diagnosis not present

## 2020-10-16 DIAGNOSIS — R634 Abnormal weight loss: Secondary | ICD-10-CM | POA: Diagnosis not present

## 2020-10-16 DIAGNOSIS — R531 Weakness: Secondary | ICD-10-CM | POA: Diagnosis not present

## 2020-10-16 DIAGNOSIS — Z5900 Homelessness unspecified: Secondary | ICD-10-CM

## 2020-10-16 DIAGNOSIS — R4189 Other symptoms and signs involving cognitive functions and awareness: Secondary | ICD-10-CM | POA: Insufficient documentation

## 2020-10-16 DIAGNOSIS — H547 Unspecified visual loss: Secondary | ICD-10-CM | POA: Diagnosis not present

## 2020-10-16 DIAGNOSIS — T424X5A Adverse effect of benzodiazepines, initial encounter: Secondary | ICD-10-CM | POA: Insufficient documentation

## 2020-10-16 DIAGNOSIS — Z79899 Other long term (current) drug therapy: Secondary | ICD-10-CM | POA: Diagnosis not present

## 2020-10-16 DIAGNOSIS — I10 Essential (primary) hypertension: Secondary | ICD-10-CM | POA: Insufficient documentation

## 2020-10-16 DIAGNOSIS — T40425A Adverse effect of tramadol, initial encounter: Secondary | ICD-10-CM | POA: Diagnosis not present

## 2020-10-16 DIAGNOSIS — H538 Other visual disturbances: Secondary | ICD-10-CM | POA: Diagnosis present

## 2020-10-16 DIAGNOSIS — T887XXA Unspecified adverse effect of drug or medicament, initial encounter: Secondary | ICD-10-CM

## 2020-10-16 DIAGNOSIS — Z7982 Long term (current) use of aspirin: Secondary | ICD-10-CM | POA: Diagnosis not present

## 2020-10-16 LAB — CBC WITH DIFFERENTIAL/PLATELET
Abs Immature Granulocytes: 0.02 10*3/uL (ref 0.00–0.07)
Basophils Absolute: 0.1 10*3/uL (ref 0.0–0.1)
Basophils Relative: 1 %
Eosinophils Absolute: 0.1 10*3/uL (ref 0.0–0.5)
Eosinophils Relative: 1 %
HCT: 42 % (ref 39.0–52.0)
Hemoglobin: 14.5 g/dL (ref 13.0–17.0)
Immature Granulocytes: 0 %
Lymphocytes Relative: 25 %
Lymphs Abs: 1.8 10*3/uL (ref 0.7–4.0)
MCH: 30.9 pg (ref 26.0–34.0)
MCHC: 34.5 g/dL (ref 30.0–36.0)
MCV: 89.6 fL (ref 80.0–100.0)
Monocytes Absolute: 1.2 10*3/uL — ABNORMAL HIGH (ref 0.1–1.0)
Monocytes Relative: 16 %
Neutro Abs: 4.1 10*3/uL (ref 1.7–7.7)
Neutrophils Relative %: 57 %
Platelets: 280 10*3/uL (ref 150–400)
RBC: 4.69 MIL/uL (ref 4.22–5.81)
RDW: 12.7 % (ref 11.5–15.5)
WBC: 7.2 10*3/uL (ref 4.0–10.5)
nRBC: 0 % (ref 0.0–0.2)

## 2020-10-16 LAB — COMPREHENSIVE METABOLIC PANEL
ALT: 21 U/L (ref 0–44)
AST: 17 U/L (ref 15–41)
Albumin: 4.4 g/dL (ref 3.5–5.0)
Alkaline Phosphatase: 78 U/L (ref 38–126)
Anion gap: 9 (ref 5–15)
BUN: 14 mg/dL (ref 6–20)
CO2: 23 mmol/L (ref 22–32)
Calcium: 9.2 mg/dL (ref 8.9–10.3)
Chloride: 108 mmol/L (ref 98–111)
Creatinine, Ser: 0.77 mg/dL (ref 0.61–1.24)
GFR, Estimated: >60 mL/min (ref >60)
Glucose, Bld: 125 mg/dL — ABNORMAL HIGH (ref 70–99)
Potassium: 3.9 mmol/L (ref 3.5–5.1)
Sodium: 140 mmol/L (ref 135–145)
Total Bilirubin: 0.6 mg/dL (ref 0.3–1.2)
Total Protein: 7.4 g/dL (ref 6.5–8.1)

## 2020-10-16 NOTE — ED Triage Notes (Addendum)
Pt presents to ER c/o possible reaction to medicine at home.  Pt states he took tramadol, klonipin, and some cough medicine altogether, and about 4 hrs after taking medicines, his vision became blurry.  Pt states he noticed his blurry vision and difficulty walking around 1830 today.  Pt denies any other neuro deficits.  Pt A&Ox4 at this time.  Pt is poor historian of event today.

## 2020-10-17 DIAGNOSIS — Z5900 Homelessness unspecified: Secondary | ICD-10-CM | POA: Diagnosis not present

## 2020-10-17 DIAGNOSIS — I10 Essential (primary) hypertension: Secondary | ICD-10-CM | POA: Diagnosis not present

## 2020-10-17 LAB — URINE DRUG SCREEN, QUALITATIVE (ARMC ONLY)
Amphetamines, Ur Screen: NOT DETECTED
Barbiturates, Ur Screen: NOT DETECTED
Benzodiazepine, Ur Scrn: NOT DETECTED
Cannabinoid 50 Ng, Ur ~~LOC~~: NOT DETECTED
Cocaine Metabolite,Ur ~~LOC~~: NOT DETECTED
MDMA (Ecstasy)Ur Screen: NOT DETECTED
Methadone Scn, Ur: NOT DETECTED
Opiate, Ur Screen: NOT DETECTED
Phencyclidine (PCP) Ur S: NOT DETECTED
Tricyclic, Ur Screen: NOT DETECTED

## 2020-10-17 LAB — URINALYSIS, COMPLETE (UACMP) WITH MICROSCOPIC
Bacteria, UA: NONE SEEN
Bilirubin Urine: NEGATIVE
Glucose, UA: NEGATIVE mg/dL
Ketones, ur: NEGATIVE mg/dL
Leukocytes,Ua: NEGATIVE
Nitrite: NEGATIVE
Protein, ur: NEGATIVE mg/dL
Specific Gravity, Urine: 1.015 (ref 1.005–1.030)
Squamous Epithelial / HPF: NONE SEEN (ref 0–5)
pH: 5 (ref 5.0–8.0)

## 2020-10-17 LAB — ACETAMINOPHEN LEVEL: Acetaminophen (Tylenol), Serum: <10 ug/mL — ABNORMAL LOW (ref 10–30)

## 2020-10-17 LAB — SALICYLATE LEVEL: Salicylate Lvl: <7 mg/dL — ABNORMAL LOW (ref 7.0–30.0)

## 2020-10-17 MED ORDER — PANTOPRAZOLE SODIUM 20 MG PO TBEC
20.0000 mg | DELAYED_RELEASE_TABLET | Freq: Two times a day (BID) | ORAL | Status: DC
  Filled 2020-10-17: qty 1

## 2020-10-17 MED ORDER — SENNA 8.6 MG PO TABS
1.0000 | ORAL_TABLET | Freq: Every day | ORAL | Status: DC
  Administered 2020-10-17: 8.6 mg via ORAL
  Filled 2020-10-17: qty 1

## 2020-10-17 MED ORDER — ASPIRIN 81 MG PO CHEW
81.0000 mg | CHEWABLE_TABLET | Freq: Every day | ORAL | Status: DC
  Administered 2020-10-17: 81 mg via ORAL
  Filled 2020-10-17: qty 1

## 2020-10-17 MED ORDER — TAMSULOSIN HCL 0.4 MG PO CAPS
0.4000 mg | ORAL_CAPSULE | Freq: Every day | ORAL | Status: DC
  Administered 2020-10-17: 0.4 mg via ORAL
  Filled 2020-10-17: qty 1

## 2020-10-17 MED ORDER — LAMOTRIGINE 100 MG PO TABS
200.0000 mg | ORAL_TABLET | Freq: Two times a day (BID) | ORAL | Status: DC
Start: 2020-10-17 — End: 2020-10-17
  Administered 2020-10-17: 200 mg via ORAL
  Filled 2020-10-17: qty 2

## 2020-10-17 MED ORDER — LACOSAMIDE 50 MG PO TABS
200.0000 mg | ORAL_TABLET | Freq: Two times a day (BID) | ORAL | Status: DC
  Administered 2020-10-17: 200 mg via ORAL
  Filled 2020-10-17: qty 4

## 2020-10-17 MED ORDER — AMLODIPINE BESYLATE 5 MG PO TABS
5.0000 mg | ORAL_TABLET | Freq: Every day | ORAL | Status: DC
  Administered 2020-10-17: 5 mg via ORAL
  Filled 2020-10-17: qty 1

## 2020-10-17 MED ORDER — ACETAMINOPHEN 500 MG PO TABS
1000.0000 mg | ORAL_TABLET | Freq: Once | ORAL | Status: AC
  Administered 2020-10-17: 1000 mg via ORAL
  Filled 2020-10-17: qty 2

## 2020-10-17 NOTE — ED Notes (Signed)
Called and spoke with dietary, asked them to send pt a meal tray. RN offered to see if there were any sandwiches in refrigerator but pt refused.

## 2020-10-17 NOTE — ED Notes (Signed)
Meal tray delivered by dining services

## 2020-10-17 NOTE — Discharge Instructions (Signed)
Please follow-up with social service recommendations as advised by her Education officer, museum.  Return to the ER right away if you develop any new or significant concerns.  Please use your medications only as prescribed.  Avoid use of drugs and alcohol.  Follow-up closely with outpatient resource as well as your primary care physician.

## 2020-10-17 NOTE — ED Provider Notes (Signed)
Patient home medications verified through pharmacy.   Delman Kitten, MD 10/17/20 [phone]

## 2020-10-17 NOTE — ED Notes (Signed)
Patient is refusing meds. States he must have real food prior to eating

## 2020-10-17 NOTE — ED Provider Notes (Signed)
[facility] Emergency Department Provider Note  ____________________________________________  Time seen: Approximately 1:40 AM  I have reviewed the triage vital signs and the nursing notes.   HISTORY  Chief Complaint Medication Reaction   HPI LARNELL GRANLUND is a 60 y.o. male with history of meningioma s/p resection, hypertension, hyperlipidemia, anxiety, prediabetes who presents for evaluation of blurry vision.  Patient reports that at noon he took a klonopin, OTC congestion medication, and tramadol. Two hours later he reports feeling fuzzy thoughts and blurry vision. He denies any changes in his gait (patient reports history of unsteady gait and uses a cane), slurred speech, facial droop, unilateral weakness or numbness, dizziness, nausea, vomiting, diarrhea.  Patient denies headache, chest pain or shortness of breath.  Patient also complains about being homeless.  He was leaving in his car but is now staying with a friend. He does not feel safe where he is satying. Patient says "if someone was to go to where I live and do something that could hurt me, I don't walk that well and I would not be able to get out fast enough." Patient reports that there are drugs on his friend's house but he does not use any. He has no where else to go. He denies SI or HI. Patient reports that he has not eaten all day today.   Past Medical History:  Diagnosis Date  . Anxiety   . Brain tumor ([place]) 02/20/2013   brain tumor removed in March 2014, Dr Donald Pore  . Diffuse idiopathic skeletal hyperostosis 06/05/2013  . Dizziness   . Enlarged prostate   . GERD (gastroesophageal reflux disease)   . Headache(784.0)    scattered  . High cholesterol   . Hypertension   . 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[place]) 03/07/2019  . Meningioma ([place])   . Meningioma, recurrent of brain ([place]) 01/31/2019  . Neuropathy 12/05/2019  . Rotator cuff tear    right  . Seizures ([place])    11/27/19 last  sz 1 wk ago    Patient Active Problem List   Diagnosis Date Noted  . Essential hypertension 07/02/2020  . Prediabetes 07/02/2020  . Polyuria 07/02/2020  . Nontraumatic complete tear of right rotator cuff   . Impingement syndrome of right shoulder   . Neuropathy 12/05/2019  . 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[place]) 03/07/2019  . Seizures ([place]) 01/31/2019  . Meningioma, recurrent of brain ([place]) 01/31/2019  . Meningioma ([place]) 01/03/2019  . Diffuse idiopathic skeletal hyperostosis 06/05/2013    Past Surgical History:  Procedure Laterality Date  . APPLICATION OF CRANIAL NAVIGATION N/A 01/10/2019   Procedure: APPLICATION OF CRANIAL NAVIGATION;  Surgeon: Erline Levine, MD;  Location: [facility];  Service: Neurosurgery;  Laterality: N/A;  . CATARACT EXTRACTION Right   . CRANIOTOMY Right 11/06/2012   Procedure: CRANIOTOMY TUMOR EXCISION;  Surgeon: Erline Levine, MD;  Location: [facility];  Service: Neurosurgery;  Laterality: Right;  Right Parasagittal craniotomy for meningioma with Stealth  . CRANIOTOMY Right 01/10/2019   Procedure: Right Parasagittal Craniotomy for Tumor;  Surgeon: Erline Levine, MD;  Location: [facility];  Service: Neurosurgery;  Laterality: Right;  Right parasagittal craniotomy for tumor  . ESOPHAGOGASTRODUODENOSCOPY    . right knee arthroscopy    . SHOULDER ARTHROSCOPY Right 06/09/2020   Procedure: RIGHT SHOULDER ARTHROSCOPY AND DEBRIDEMENT;  Surgeon: Newt Minion, MD;  Location: [facility];  Service: Orthopedics;  Laterality: Right;    Prior to Admission medications   Medication  Sig Start Date End Date Taking? Authorizing Provider  traMADol (ULTRAM) 50 MG tablet Take 1 tablet (50 mg total) by mouth every 12 (twelve) hours as needed. 10/12/20 10/12/21  Magnant, Gerrianne Scale, PA-C  Accu-Chek Softclix Lancets lancets Use as instructed 07/08/20   Mayers, Cari S, PA-C  albuterol (VENTOLIN HFA) 108 (90 Base) MCG/ACT inhaler Inhale 2 puffs into the lungs every 6  (six) hours as needed for wheezing or shortness of breath. 09/06/20   Merlyn Lot, MD  amLODipine (NORVASC) 5 MG tablet Take 1 tablet (5 mg total) by mouth daily. To lower blood pressure 10/05/20   Charlott Rakes, MD  aspirin 81 MG chewable tablet     [provider]  atorvastatin (LIPITOR) 20 MG tablet TAKE 1 TABLET BY MOUTH EVERY DAY 09/25/20   Charlott Rakes, MD  benzonatate (TESSALON) 100 MG capsule Take 1 capsule (100 mg total) by mouth 2 (two) times daily as needed for cough. 09/10/20   Charlott Rakes, MD  clonazePAM (KLONOPIN) 0.5 MG tablet Take 1 tablet (0.5 mg total) by mouth at bedtime. 10/01/20   [name], MD  glucose blood (ACCU-CHEK AVIVA PLUS) test strip Use as instructed 07/08/20   Mayers, Cari S, PA-C  Lacosamide (VIMPAT) 100 MG TABS TAKE 2 TABLETS IN MORNING AND 2 AT BEDTIME 09/29/20   [name], MD  lamoTRIgine (LAMICTAL) 100 MG tablet TAKE 2 TABLETS BY MOUTH TWICE A DAY 09/25/20   Vaslow, Acey Lav, MD  Misc. Devices MISC Please provide BP cuff for patient 07/08/20   Argentina Donovan, PA-C  ondansetron (ZOFRAN) 4 MG tablet Take 1 tablet (4 mg total) by mouth every 8 (eight) hours as needed for nausea or vomiting. 07/31/20   Swords, Darrick Penna, MD  pantoprazole (PROTONIX) 20 MG tablet Take 1 tablet (20 mg total) by mouth 2 (two) times daily before a meal. 7/86/76 02/03/93  Jodi Marble, MD  tamsulosin (FLOMAX) 0.4 MG CAPS capsule Take 1 capsule (0.4 mg total) by mouth daily. 07/23/20   Billey Co, MD    Allergies Patient has no known allergies.  Family History  Problem Relation Age of Onset  . Hypertension Mother   . Dementia Mother   . Diabetes Mother   . Hypertension Father   . CAD Father   . Diabetes Father   . CAD Brother     Social History Social History   Tobacco Use  . Smoking status: Former Smoker    Types: Cigarettes    Quit date: 01/29/2019    Years since quitting: 1.7  . Smokeless tobacco: Never Used  . Tobacco comment:  weekend smoker  Vaping Use  . Vaping Use: Never used  Substance Use Topics  . Alcohol use: Not Currently    Comment: social  . Drug use: No    Review of Systems  Constitutional: Negative for fever. + fuzzy thoughts Eyes: + visual changes. ENT: Negative for sore throat. Neck: No neck pain  Cardiovascular: Negative for chest pain. Respiratory: Negative for shortness of breath. Gastrointestinal: Negative for abdominal pain, vomiting or diarrhea. Genitourinary: Negative for dysuria. Musculoskeletal: Negative for back pain. Skin: Negative for rash. Neurological: Negative for headaches, weakness or numbness. Psych: No SI or HI  ____________________________________________   PHYSICAL EXAM:  VITAL SIGNS: ED Triage Vitals  Enc Vitals Group     BP 10/16/20 2034 (!) 143/86     Pulse Rate 10/16/20 2034 92     Resp 10/16/20 2034 18     Temp  10/16/20 2034 98.9 F (37.2 C)     Temp Source 10/16/20 2034 Oral     SpO2 10/16/20 2034 96 %     Weight 10/16/20 2035 185 lb (83.9 kg)     Height 10/16/20 2035 5\' 9"  (1.753 m)     Head Circumference --      Peak Flow --      Pain Score 10/16/20 2035 0     Pain Loc --      Pain Edu? --      Excl. in [place]? --     Constitutional: Alert and oriented. Well appearing and in no apparent distress. HEENT:      Head: Normocephalic and atraumatic.         Eyes: Conjunctivae are normal. Sclera is non-icteric. EOMI, PERRL      Mouth/Throat: Mucous membranes are moist.       Neck: Supple with no signs of meningismus. Cardiovascular: Regular rate and rhythm. No murmurs, gallops, or rubs. 2+ symmetrical distal pulses are present in all extremities. No JVD. Respiratory: Normal respiratory effort. Lungs are clear to auscultation bilaterally.  Gastrointestinal: Soft, non tender. Musculoskeletal:  No edema, cyanosis, or erythema of extremities. Neurologic: Normal speech and language. Face is symmetric. Moving all extremities.No dysmetria, no pronator  drift, intact strength and sensation Skin: Skin is warm, dry and intact. No rash noted. Psychiatric: Mood and affect are normal. Speech and behavior are normal.  ____________________________________________   LABS (all labs ordered are listed, but only abnormal results are displayed)  Labs Reviewed  CBC WITH DIFFERENTIAL/PLATELET - Abnormal; Notable for the following components:      Result Value   Monocytes Absolute 1.2 (*)    All other components within normal limits  COMPREHENSIVE METABOLIC PANEL - Abnormal; Notable for the following components:   Glucose, Bld 125 (*)    All other components within normal limits  URINALYSIS, COMPLETE (UACMP) WITH MICROSCOPIC - Abnormal; Notable for the following components:   Color, Urine YELLOW (*)    APPearance CLEAR (*)    Hgb urine dipstick SMALL (*)    All other components within normal limits  SALICYLATE LEVEL - Abnormal; Notable for the following components:   Salicylate Lvl <2.2 (*)    All other components within normal limits  ACETAMINOPHEN LEVEL - Abnormal; Notable for the following components:   Acetaminophen (Tylenol), Serum <10 (*)    All other components within normal limits  URINE DRUG SCREEN, QUALITATIVE ([facility] ONLY)   ____________________________________________  EKG  ED ECG REPORT I, Rudene Re, the attending physician, personally viewed and interpreted this ECG.  Normal sinus rhythm, rate of 76, normal intervals, normal axis, no ST elevations or depressions ____________________________________________  RADIOLOGY  I have personally reviewed the images performed during this visit and I agree with the Radiologist's read.   Interpretation by Radiologist:  CT Head Wo Contrast  Result Date: 10/16/2020 CLINICAL DATA:  60 year old male with weakness and vision loss. History of prior parafalcine meningioma resection. EXAM: CT HEAD WITHOUT CONTRAST TECHNIQUE: Contiguous axial images were obtained from the base of the  skull through the vertex without intravenous contrast. COMPARISON:  Head CT dated 06/11/2020. FINDINGS: Brain: The ventricles and sulci appropriate size for patient's age. Mild periventricular and deep white matter chronic microvascular ischemic changes noted. Areas of white matter hypodensity involving the frontoparietal convexities similar to prior CT and likely related to prior surgery. There is no acute intracranial hemorrhage. No mass effect or midline shift. No extra-axial fluid collection. Vascular:  No hyperdense vessel or unexpected calcification. Skull: No acute calvarial pathology. Postsurgical changes of meningioma resection and frontoparietal craniotomy. Sinuses/Orbits: No acute finding. Other: None IMPRESSION: 1. No acute intracranial pathology. 2. Mild chronic microvascular ischemic changes. 3. Stable postsurgical changes of meningioma resection and frontoparietal craniotomy. Electronically Signed   By: Anner Crete M.D.   On: 10/16/2020 21:38     ____________________________________________   PROCEDURES  Procedure(s) performed: None Procedures Critical Care performed:  None ____________________________________________   INITIAL IMPRESSION / ASSESSMENT AND PLAN / ED COURSE  60 y.o. male with history of meningioma s/p resection, hypertension, hyperlipidemia, anxiety, prediabetes who presents for evaluation of blurry vision and fuzzy thoughts. Patient is also homeless and currently living with a friend but does not feel safe at the friend's house.   Patient is well appearing, neuro intact, normal vital signs. EKG with no ischemia or dysrhythmias.    Ddx medication side effect, oversedation, intoxication, intracranial hemorrhage, recurrence of meningioma, dehydration, electrolyte derangements, hypo/hyperglycemia   Will check labs, head CT, UDS, tylenol/salicylate levels. Will give a meal tray and consult SW. Old medical records reviewed.   _________________________ 4:09 AM  on 10/17/2020 -----------------------------------------  Head CT visualized by me with no acute findings, confirmed by radiology.  UA with no signs of dehydration.  UDS negative.  No leukocytosis, no anemia, no electrolyte derangements, no dehydration.  Patient is medically clear pending social work evaluation in the morning       _____________________________________________ Please note:  Patient was evaluated in Emergency Department today for the symptoms described in the history of present illness. Patient was evaluated in the context of the global COVID-19 pandemic, which necessitated consideration that the patient might be at risk for infection with the SARS-CoV-2 virus that causes COVID-19. Institutional protocols and algorithms that pertain to the evaluation of patients at risk for COVID-19 are in a state of rapid change based on information released by regulatory bodies including the CDC and federal and state organizations. These policies and algorithms were followed during the patient's care in the ED.  Some ED evaluations and interventions may be delayed as a result of limited staffing during the pandemic.   [place] Controlled Substance Database was reviewed by me. ____________________________________________   FINAL CLINICAL IMPRESSION(S) / ED DIAGNOSES   Final diagnoses:  Medication side effect  Homelessness      NEW MEDICATIONS STARTED DURING THIS VISIT:  ED Discharge Orders    None       Note:  This document was prepared using Dragon voice recognition software and may include unintentional dictation errors.    Alfred Levins, [place], MD 10/17/20 [id number]

## 2020-10-17 NOTE — ED Provider Notes (Signed)
Patient resting quite comfortably in the hallway has eaten a meal.  Sitting in chair without distress.  He spoke with her social work team, and he advised he is going to be connecting with additional resources intends to return to [facility] for this.  He is fully awake and alert normal vital signs.  At this point we will discharge him with the plan to connect with additional social services and follow-up with primary care physician.  Patient understanding and is very agreeable with this plan.  Return precautions and treatment recommendations and follow-up discussed with the patient who is agreeable with the plan.    Delman Kitten, MD 10/17/20 1144

## 2020-10-17 NOTE — ED Notes (Signed)
Patient up to bathroom independently. Patient given recliner for comfort.

## 2020-10-17 NOTE — ED Provider Notes (Signed)
Transtion of care team advises NOT able to be placed in a SNF but able to provide him resources and connection with social services on discharge.    Delman Kitten, MD 10/17/20 1134

## 2020-10-17 NOTE — Social Work (Signed)
[place] working remotely spoke with Pt via call phone.  Pt states that he has shoulder surgery scheduled for the end of this month with provider in [place].   Pt states that he is currently staying with friends but feels unsafe so is sleeping in his truck at night which is causing discomfort due to shoulder pain.  Pt does get disability payments but is struggling to find suitable housing at this time. Per Pt, Pt is currently on waiting list at [employer].  CSW and Pt discussed where Pt would like shelter resources and Pt specified [facility]. CSW sent resource list for shelters in [place] as well as financial resources, [employer] and with Pt's permission , mental health resources.  Vergie Living MSW LCSWA Transitions of Care  Clinical Social Worker  [facility] Emergency Departments  [phone]

## 2020-10-17 NOTE — ED Notes (Addendum)
Went to obtain new vitals and discharge patient, pt not in bed, will attempt to discharge again at a later time.

## 2020-10-17 NOTE — ED Notes (Signed)
Went back to attempt to update vital signs and discharge pt. Pt is not in his bed and is not in the bathroom.

## 2020-10-17 NOTE — ED Notes (Signed)
Pharmacy tech at bedside doing med rec

## 2020-10-22 ENCOUNTER — Encounter: Payer: Self-pay | Admitting: Physical Medicine & Rehabilitation

## 2020-10-22 ENCOUNTER — Encounter: Payer: Medicaid Other | Attending: Physical Medicine & Rehabilitation | Admitting: Physical Medicine & Rehabilitation

## 2020-10-22 ENCOUNTER — Other Ambulatory Visit: Payer: Self-pay

## 2020-10-22 VITALS — BP 143/85 | HR 76 | Temp 98.7°F | Ht 69.0 in | Wt 192.4 lb

## 2020-10-22 DIAGNOSIS — G8310 Monoplegia of lower limb affecting unspecified side: Secondary | ICD-10-CM | POA: Diagnosis not present

## 2020-10-22 NOTE — Patient Instructions (Signed)

## 2020-10-22 NOTE — Progress Notes (Signed)
Botox Injection for spasticity using needle EMG guidance  Dilution: 50 Units/ml Indication: Severe spasticity which interferes with ADL,mobility and/or  hygiene and is unresponsive to medication management and other conservative care Informed consent was obtained after describing risks and benefits of the procedure with the patient. This includes bleeding, bruising, infection, excessive weakness, or medication side effects. A REMS form is on file and signed. Needle: 62mm 25 g needle electrode Number of units per muscle Left Gastroc - med head 50U Left FDL 75U Left Post tib 75U All injections were done after obtaining appropriate EMG activity and after negative drawback for blood. The patient tolerated the procedure well. Post procedure instructions were given. A followup appointment was made.

## 2020-10-26 ENCOUNTER — Telehealth: Payer: Self-pay

## 2020-10-26 NOTE — Telephone Encounter (Signed)
Pt called and would like to speak with Dr. Jess Barters Surgery Scheduler.

## 2020-10-28 ENCOUNTER — Other Ambulatory Visit: Payer: Self-pay | Admitting: Internal Medicine

## 2020-10-28 ENCOUNTER — Telehealth: Payer: Self-pay | Admitting: Internal Medicine

## 2020-10-28 ENCOUNTER — Other Ambulatory Visit: Payer: Self-pay

## 2020-10-28 DIAGNOSIS — R569 Unspecified convulsions: Secondary | ICD-10-CM

## 2020-10-28 DIAGNOSIS — C700 Malignant neoplasm of cerebral meninges: Secondary | ICD-10-CM

## 2020-10-28 MED ORDER — LAMOTRIGINE 100 MG PO TABS: 200.0000 mg | ORAL_TABLET | Freq: Two times a day (BID) | ORAL | 0 refills | Status: DC

## 2020-10-28 NOTE — Telephone Encounter (Signed)
Called and left msg about may appt . Mailed printout

## 2020-10-28 NOTE — Telephone Encounter (Signed)
Pt called back and is also requesting Clonazepam.  This rx request has been sent to Dr. Mickeal Skinner for review.

## 2020-10-28 NOTE — Telephone Encounter (Signed)
Pt called requesting a refill of Lamotrigine. Pt states he is completely out of this medication.  Pt has been encouraged to call in his rx refills before he runs out to prevent any delay. Pt expressed understanding of the information.   [facility] [address].  Refill of Lamotrigine has been sent and detailed message has been left on pts VM.

## 2020-10-29 ENCOUNTER — Ambulatory Visit: Payer: Medicaid Other | Admitting: Internal Medicine

## 2020-10-29 ENCOUNTER — Telehealth: Payer: Self-pay | Admitting: *Deleted

## 2020-10-29 DIAGNOSIS — C700 Malignant neoplasm of cerebral meninges: Secondary | ICD-10-CM

## 2020-10-29 MED ORDER — CLONAZEPAM 0.5 MG PO TABS: 0.5000 mg | ORAL_TABLET | Freq: Every day | ORAL | 2 refills | Status: DC

## 2020-10-29 NOTE — Telephone Encounter (Signed)
Patient called requesting refill of Klonopin to different pharmacy than last time.  Sending to physician for review.  [facility] [address].

## 2020-10-29 NOTE — Addendum Note (Signed)
Addended by: [name] on: 10/29/2020 02:59 PM   Modules accepted: Orders

## 2020-11-02 ENCOUNTER — Telehealth: Payer: Self-pay

## 2020-11-02 ENCOUNTER — Telehealth: Payer: Self-pay | Admitting: Orthopedic Surgery

## 2020-11-02 NOTE — Telephone Encounter (Signed)
Dr. Marlou Sa saw this pt last month and discussed a reverse total  shoulder please see message below.

## 2020-11-02 NOTE — Telephone Encounter (Signed)
Request for clearance has been sent to patient's PCP Dr. Margarita Rana.  Patient has asked to be scheduled 11-24-20 for left reverse shoulder arthroplasty.  Patient will call PCP tomorrow to have them sign off on surgery.  Patient status is still homeless.  He wants to proceed with surgery and is asking if he will stay somewhere for a few weeks of recovery. Patient states he has transportation to and from appointments associated with surgery, including the day of surgery.     Pt's  cb  [phone]

## 2020-11-02 NOTE — Telephone Encounter (Signed)
See below.  I called patient to advise per Dr Forbes Cellar note below.

## 2020-11-02 NOTE — Telephone Encounter (Signed)
Patient called regarding his surgery date he wanted to speak to cheryl I advised patient cherly is out of the office today she will return tomorrow patient stated he took a fall on his shoulder he stated if he isn't going to have surgery soon he would like a cortisone injection patient also stated rx for lamotrigine is making him loopy he would like a call back:[phone] patient is also requesting info regarding progress of surgery

## 2020-11-02 NOTE — Telephone Encounter (Signed)
I would say he needs medical risk stratification and "clearance" from his primary care provider regarding his suitability for replacement.  Also before replacement we will need to check hemoglobin and albumin on him.  If primary care signed off on him then we can order those lab studies and proceed.

## 2020-11-02 NOTE — Telephone Encounter (Signed)
See below. Please advise.  

## 2020-11-03 ENCOUNTER — Telehealth: Payer: Self-pay | Admitting: Family Medicine

## 2020-11-03 NOTE — Telephone Encounter (Signed)
Please advise 

## 2020-11-03 NOTE — Telephone Encounter (Signed)
No schedule yet

## 2020-11-03 NOTE — Telephone Encounter (Signed)
Copied from [place] [id number]. Topic: General - Other >> Nov 02, 2020  2:41 PM Tessa Lerner A wrote: Reason for CRM: Patient would like to be contacted by a member of clinical staff when possible  Patient has spoken with Dr. Randel Pigg office regarding a shoulder surgery and been told that the surgery will require Dr. Smitty Pluck Prior Auth  Patient would like to further discuss this when possible  Patient declined to make an additional appointment at the time of call   Please contact to advise further when possible

## 2020-11-04 NOTE — Telephone Encounter (Signed)
Can you please call patient and discuss? Joshua [name] stated that patient had given her a hard time about why he is not being scheduled for surgery.

## 2020-11-04 NOTE — Telephone Encounter (Signed)
Cannot get surgery because no place to go after hospital or after skilled nursing

## 2020-11-05 NOTE — Telephone Encounter (Signed)
Tried calling to advise. No answer. Received a greeting stating patient unable to take calls at this time.

## 2020-11-06 ENCOUNTER — Telehealth: Payer: Self-pay | Admitting: Family Medicine

## 2020-11-06 NOTE — Telephone Encounter (Signed)
Provided letter of medical clearance for reverse right shoulder arthroscopy

## 2020-11-06 NOTE — Telephone Encounter (Signed)
Pt was called and set an appointment for a surgery clearance.

## 2020-11-12 ENCOUNTER — Other Ambulatory Visit: Payer: Self-pay | Admitting: Internal Medicine

## 2020-11-12 DIAGNOSIS — C700 Malignant neoplasm of cerebral meninges: Secondary | ICD-10-CM

## 2020-11-12 DIAGNOSIS — R569 Unspecified convulsions: Secondary | ICD-10-CM

## 2020-11-13 NOTE — Telephone Encounter (Signed)
Attempted to call pt to sched W/ Dr. Erik Obey if still needed. No answer

## 2020-11-16 ENCOUNTER — Telehealth: Payer: Self-pay | Admitting: *Deleted

## 2020-11-16 ENCOUNTER — Other Ambulatory Visit: Payer: Self-pay

## 2020-11-16 NOTE — Telephone Encounter (Signed)
Patient called requesting refill of V[facility]  Routed to MD

## 2020-11-18 ENCOUNTER — Emergency Department
Admission: EM | Admit: 2020-11-18 | Discharge: 2020-11-18 | Disposition: A | Discharge status: Home / Self Care | Payer: Medicaid Other | Attending: Emergency Medicine | Admitting: Emergency Medicine

## 2020-11-18 ENCOUNTER — Other Ambulatory Visit: Payer: Self-pay

## 2020-11-18 ENCOUNTER — Emergency Department: Payer: Medicaid Other

## 2020-11-18 DIAGNOSIS — Z7982 Long term (current) use of aspirin: Secondary | ICD-10-CM | POA: Diagnosis not present

## 2020-11-18 DIAGNOSIS — N400 Enlarged prostate without lower urinary tract symptoms: Secondary | ICD-10-CM | POA: Diagnosis not present

## 2020-11-18 DIAGNOSIS — K760 Fatty (change of) liver, not elsewhere classified: Secondary | ICD-10-CM | POA: Diagnosis not present

## 2020-11-18 DIAGNOSIS — R109 Unspecified abdominal pain: Secondary | ICD-10-CM | POA: Diagnosis not present

## 2020-11-18 DIAGNOSIS — I10 Essential (primary) hypertension: Secondary | ICD-10-CM | POA: Insufficient documentation

## 2020-11-18 DIAGNOSIS — Z79899 Other long term (current) drug therapy: Secondary | ICD-10-CM | POA: Diagnosis not present

## 2020-11-18 DIAGNOSIS — Z87891 Personal history of nicotine dependence: Secondary | ICD-10-CM | POA: Diagnosis not present

## 2020-11-18 DIAGNOSIS — K429 Umbilical hernia without obstruction or gangrene: Secondary | ICD-10-CM | POA: Diagnosis not present

## 2020-11-18 DIAGNOSIS — E278 Other specified disorders of adrenal gland: Secondary | ICD-10-CM | POA: Diagnosis not present

## 2020-11-18 DIAGNOSIS — Z85841 Personal history of malignant neoplasm of brain: Secondary | ICD-10-CM | POA: Insufficient documentation

## 2020-11-18 LAB — BASIC METABOLIC PANEL
Anion gap: 7 (ref 5–15)
BUN: 13 mg/dL (ref 6–20)
CO2: 27 mmol/L (ref 22–32)
Calcium: 9.4 mg/dL (ref 8.9–10.3)
Chloride: 107 mmol/L (ref 98–111)
Creatinine, Ser: 1 mg/dL (ref 0.61–1.24)
GFR, Estimated: >60 mL/min (ref >60)
Glucose, Bld: 112 mg/dL — ABNORMAL HIGH (ref 70–99)
Potassium: 4.5 mmol/L (ref 3.5–5.1)
Sodium: 141 mmol/L (ref 135–145)

## 2020-11-18 LAB — URINALYSIS, COMPLETE (UACMP) WITH MICROSCOPIC
Bacteria, UA: NONE SEEN
Bilirubin Urine: NEGATIVE
Glucose, UA: NEGATIVE mg/dL
Hgb urine dipstick: NEGATIVE
Ketones, ur: NEGATIVE mg/dL
Leukocytes,Ua: NEGATIVE
Nitrite: NEGATIVE
Protein, ur: 30 mg/dL — AB
Specific Gravity, Urine: 1.021 (ref 1.005–1.030)
Squamous Epithelial / HPF: NONE SEEN (ref 0–5)
pH: 6 (ref 5.0–8.0)

## 2020-11-18 LAB — CBC
HCT: 43.9 % (ref 39.0–52.0)
Hemoglobin: 14.8 g/dL (ref 13.0–17.0)
MCH: 29.9 pg (ref 26.0–34.0)
MCHC: 33.7 g/dL (ref 30.0–36.0)
MCV: 88.7 fL (ref 80.0–100.0)
Platelets: 274 10*3/uL (ref 150–400)
RBC: 4.95 MIL/uL (ref 4.22–5.81)
RDW: 13 % (ref 11.5–15.5)
WBC: 6.4 10*3/uL (ref 4.0–10.5)
nRBC: 0 % (ref 0.0–0.2)

## 2020-11-18 MED ORDER — METHOCARBAMOL 500 MG PO TABS: 500.0000 mg | ORAL_TABLET | Freq: Three times a day (TID) | ORAL | 0 refills | Status: DC

## 2020-11-18 MED ORDER — KETOROLAC TROMETHAMINE 60 MG/2ML IM SOLN
60.0000 mg | Freq: Once | INTRAMUSCULAR | Status: AC
  Administered 2020-11-18: 60 mg via INTRAMUSCULAR
  Filled 2020-11-18: qty 2

## 2020-11-18 MED ORDER — MELOXICAM 7.5 MG PO TABS: 7.5000 mg | ORAL_TABLET | Freq: Every day | ORAL | 0 refills | Status: DC

## 2020-11-18 MED ORDER — OXYCODONE-ACETAMINOPHEN 5-325 MG PO TABS
2.0000 | ORAL_TABLET | Freq: Once | ORAL | Status: AC
  Administered 2020-11-18: 2 via ORAL
  Filled 2020-11-18: qty 2

## 2020-11-18 MED ORDER — ONDANSETRON 4 MG PO TBDP
4.0000 mg | ORAL_TABLET | Freq: Once | ORAL | Status: AC
  Administered 2020-11-18: 4 mg via ORAL
  Filled 2020-11-18: qty 1

## 2020-11-18 NOTE — Discharge Instructions (Signed)
I'd recommend calling your Physical Therapist/Orthopedist to discuss your flank pain. You may benefit from a custom orthotic and/or gait evaluation to prevent recurrent injury,.  Take the pain medicine/anti-inflammatory and muscle relaxants as prescribed.  Continue your tramadol

## 2020-11-18 NOTE — ED Triage Notes (Signed)
First Nurse Note:  C/O dark urine and right flank pain x 2 weeks.  STates has had similar symptoms in the past.  AAOx3. Skin warm and dry. NAD

## 2020-11-18 NOTE — ED Notes (Signed)
Pt has been provided with discharge instructions. Pt denies any questions or concerns at this time. Pt verbalizes understanding for follow up care and d/c.  VSS.  Pt left department with all belongings.  

## 2020-11-18 NOTE — ED Provider Notes (Signed)
[facility] Emergency Department Provider Note  ____________________________________________   Event Date/Time   First MD Initiated Contact with Patient 11/18/20 1105     (approximate)  I have reviewed the triage vital signs and the nursing notes.   HISTORY  Chief Complaint Flank Pain    HPI Joshua [name] is a 60 y.o. male with history of brain tumor status postresection, requiring cane, here with right flank pain.  The patient states that over the last several months he has had recurrent episodes of transient, sharp, stabbing, right flank pain.  The pain seems to be worse and shooting with certain positions, then fairly quickly resolves.  The pain is not constant.  He does note that he is also had darkening of his urine intermittently for the last several days, and the pain has been worse.  Denies any gross blood in his urine.  No fevers or chills.  No flank pain.  No nausea or vomiting.  No specific alleviating factors other than staying still.  He has not tried any medications for this.        Past Medical History:  Diagnosis Date  . Anxiety   . Brain tumor ([place]) 02/20/2013   brain tumor removed in March 2014, Dr Donald Pore  . Diffuse idiopathic skeletal hyperostosis 06/05/2013  . Dizziness   . Enlarged prostate   . GERD (gastroesophageal reflux disease)   . Headache(784.0)    scattered  . High cholesterol   . Hypertension   . 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 Meningioma ([place])   . Meningioma, recurrent of brain ([place]) 01/31/2019  . Neuropathy 12/05/2019  . Rotator cuff tear    right  . Seizures ([place])    11/27/19 last sz 1 wk ago    Patient Active Problem List   Diagnosis Date Noted  . Essential hypertension 07/02/2020  . Prediabetes 07/02/2020  . Polyuria 07/02/2020  . Nontraumatic complete tear of right rotator cuff   . Impingement syndrome of right shoulder   . Neuropathy 12/05/2019  . Malignant meningioma of  meninges of brain ([place]) 03/07/2019  . Seizures ([place]) 01/31/2019  . Meningioma, recurrent of brain ([place]) 01/31/2019  . Meningioma ([place]) 01/03/2019  . Diffuse idiopathic skeletal hyperostosis 06/05/2013    Past Surgical History:  Procedure Laterality Date  . APPLICATION OF CRANIAL NAVIGATION N/A 01/10/2019   Procedure: APPLICATION OF CRANIAL NAVIGATION;  Surgeon: Erline Levine, MD;  Location: [facility];  Service: Neurosurgery;  Laterality: N/A;  . CATARACT EXTRACTION Right   . CRANIOTOMY Right 11/06/2012   Procedure: CRANIOTOMY TUMOR EXCISION;  Surgeon: Erline Levine, MD;  Location: [facility];  Service: Neurosurgery;  Laterality: Right;  Right Parasagittal craniotomy for meningioma with Stealth  . CRANIOTOMY Right 01/10/2019   Procedure: Right Parasagittal Craniotomy for Tumor;  Surgeon: Erline Levine, MD;  Location: [facility];  Service: Neurosurgery;  Laterality: Right;  Right parasagittal craniotomy for tumor  . ESOPHAGOGASTRODUODENOSCOPY    . right knee arthroscopy    . SHOULDER ARTHROSCOPY Right 06/09/2020   Procedure: RIGHT SHOULDER ARTHROSCOPY AND DEBRIDEMENT;  Surgeon: Newt Minion, MD;  Location: [facility];  Service: Orthopedics;  Laterality: Right;    Prior to Admission medications   Medication Sig Start Date End Date Taking? Authorizing Provider  meloxicam (MOBIC) 7.5 MG tablet Take 1 tablet (7.5 mg total) by mouth daily for 7 days. 11/18/20 11/25/20 Yes Duffy Bruce, MD  methocarbamol (ROBAXIN) 500 MG tablet Take 1 tablet (500  mg total) by mouth 3 (three) times daily for 7 days. 11/18/20 11/25/20 Yes Duffy Bruce, MD  Accu-Chek Softclix Lancets lancets Use as instructed 07/08/20   Mayers, Cari S, PA-C  albuterol (VENTOLIN HFA) 108 (90 Base) MCG/ACT inhaler Inhale 2 puffs into the lungs every 6 (six) hours as needed for wheezing or shortness of breath. 09/06/20   Merlyn Lot, MD  amLODipine (NORVASC) 5 MG tablet Take 1 tablet (5 mg total) by mouth daily. To lower  blood pressure 10/05/20   Charlott Rakes, MD  aspirin 81 MG chewable tablet     [provider]  atorvastatin (LIPITOR) 20 MG tablet TAKE 1 TABLET BY MOUTH EVERY DAY 09/25/20   Charlott Rakes, MD  benzonatate (TESSALON) 100 MG capsule Take 1 capsule (100 mg total) by mouth 2 (two) times daily as needed for cough. 09/10/20   Charlott Rakes, MD  clonazePAM (KLONOPIN) 0.5 MG tablet Take 1 tablet (0.5 mg total) by mouth at bedtime. 10/29/20   [name], MD  glucose blood (ACCU-CHEK AVIVA PLUS) test strip Use as instructed 07/08/20   Mayers, Cari S, PA-C  Lacosamide (VIMPAT) 100 MG TABS TAKE 2 TABLETS IN MORNING AND 2 AT BEDTIME 09/29/20   [name], MD  lamoTRIgine (LAMICTAL) 100 MG tablet TAKE 2 TABLETS BY MOUTH TWICE A DAY 11/13/20   Vaslow, Acey Lav, MD  Misc. Devices MISC Please provide BP cuff for patient 07/08/20   Argentina Donovan, PA-C  ondansetron (ZOFRAN) 4 MG tablet Take 1 tablet (4 mg total) by mouth every 8 (eight) hours as needed for nausea or vomiting. 07/31/20   Swords, Darrick Penna, MD  pantoprazole (PROTONIX) 20 MG tablet Take 1 tablet (20 mg total) by mouth 2 (two) times daily before a meal. 0/09/38 1/82/99  Jodi Marble, MD  tamsulosin (FLOMAX) 0.4 MG CAPS capsule Take 1 capsule (0.4 mg total) by mouth daily. 07/23/20   Billey Co, MD  traMADol (ULTRAM) 50 MG tablet Take 1 tablet (50 mg total) by mouth every 12 (twelve) hours as needed. 10/12/20 10/12/21  Magnant, Gerrianne Scale, PA-C    Allergies Patient has no known allergies.  Family History  Problem Relation Age of Onset  . Hypertension Mother   . Dementia Mother   . Diabetes Mother   . Hypertension Father   . CAD Father   . Diabetes Father   . CAD Brother     Social History Social History   Tobacco Use  . Smoking status: Former Smoker    Types: Cigarettes    Quit date: 01/29/2019    Years since quitting: 1.8  . Smokeless tobacco: Never Used  . Tobacco comment: weekend smoker  Vaping Use  .  Vaping Use: Never used  Substance Use Topics  . Alcohol use: Not Currently    Comment: social  . Drug use: No    Review of Systems  Review of Systems  Constitutional: Positive for fatigue. Negative for chills and fever.  HENT: Negative for sore throat.   Respiratory: Negative for shortness of breath.   Cardiovascular: Negative for chest pain.  Gastrointestinal: Negative for abdominal pain.  Genitourinary: Positive for flank pain.  Musculoskeletal: Negative for neck pain.  Skin: Negative for rash and wound.  Allergic/Immunologic: Negative for immunocompromised state.  Neurological: Negative for weakness and numbness.  Hematological: Does not bruise/bleed easily.  All other systems reviewed and are negative.    ____________________________________________  PHYSICAL EXAM:      VITAL SIGNS: ED Triage Vitals  Enc Vitals Group     BP 11/18/20 0952 (!) 137/96     Pulse Rate 11/18/20 0952 80     Resp 11/18/20 0952 16     Temp 11/18/20 0954 98.2 F (36.8 C)     Temp Source 11/18/20 0952 Oral     SpO2 11/18/20 0952 97 %     Weight 11/18/20 0953 180 lb (81.6 kg)     Height 11/18/20 0953 5\' 9"  (1.753 m)     Head Circumference --      Peak Flow --      Pain Score 11/18/20 0952 7     Pain Loc --      Pain Edu? --      Excl. in [place]? --      Physical Exam Vitals and nursing note reviewed.  Constitutional:      General: He is not in acute distress.    Appearance: He is well-developed.  HENT:     Head: Normocephalic and atraumatic.  Eyes:     Conjunctiva/sclera: Conjunctivae normal.  Cardiovascular:     Rate and Rhythm: Normal rate and regular rhythm.     Heart sounds: Normal heart sounds.  Pulmonary:     Effort: Pulmonary effort is normal. No respiratory distress.     Breath sounds: No wheezing.  Abdominal:     General: There is no distension.  Musculoskeletal:     Cervical back: Neck supple.     Comments: Moderate musculoskeletal tenderness over the right flank.  No  wounds.  No rashes or skin lesions.  Skin:    General: Skin is warm.     Capillary Refill: Capillary refill takes less than 2 seconds.     Findings: No rash.  Neurological:     Mental Status: He is alert and oriented to person, place, and time.     Motor: No abnormal muscle tone.     Comments: Strength out of 5 bilateral lower extremities.       ____________________________________________   LABS (all labs ordered are listed, but only abnormal results are displayed)  Labs Reviewed  URINALYSIS, COMPLETE (UACMP) WITH MICROSCOPIC - Abnormal; Notable for the following components:      Result Value   Color, Urine YELLOW (*)    APPearance CLEAR (*)    Protein, ur 30 (*)    All other components within normal limits  BASIC METABOLIC PANEL - Abnormal; Notable for the following components:   Glucose, Bld 112 (*)    All other components within normal limits  CBC    ____________________________________________  EKG:  ________________________________________  RADIOLOGY All imaging, including plain films, CT scans, and ultrasounds, independently reviewed by me, and interpretations confirmed via formal radiology reads.  ED MD interpretation:   CT stone: No acute findings  Official radiology report(s): CT Renal Stone Study  Result Date: 11/18/2020 CLINICAL DATA:  Right flank pain with dark colored urine for the past 2 weeks. EXAM: CT ABDOMEN AND PELVIS WITHOUT CONTRAST TECHNIQUE: Multidetector CT imaging of the abdomen and pelvis was performed following the standard protocol without IV contrast. COMPARISON:  10/03/2020 FINDINGS: Lower chest: Clear lung bases. Hepatobiliary: Decreased attenuation of the liver consistent with fatty infiltration. Normal liver size. No masses. Normal gallbladder. No bile duct dilation. Pancreas: Unremarkable. No pancreatic ductal dilatation or surrounding inflammatory changes. Spleen: Normal in size without focal abnormality. Adrenals/Urinary Tract: 11 mm  left adrenal nodule, average Hounsfield units 4, stable in consistent with an adenoma. Normal right adrenal gland. Kidneys  normal in size, orientation and position. No renal masses, stones or hydronephrosis. Normal ureters. Normal bladder. Stomach/Bowel: Stomach is within normal limits. Appendix appears normal. No evidence of bowel wall thickening, distention, or inflammatory changes. Vascular/Lymphatic: Mild aortic atherosclerosis. No aneurysm. No enlarged lymph nodes. Reproductive: Prostate is enlarged, 5.7 x 3.8 cm transversely. Other: Small fat containing umbilical hernia. No other hernias. No ascites. Musculoskeletal: Right total hip arthroplasty, not completely imaged, but appear well seated and aligned. No acute fracture. No bone lesion. IMPRESSION: 1. No acute findings. No findings to account for the patient's right flank pain/dark urine. No renal or ureteral stones or obstructive uropathy. 2. Hepatic steatosis. 3. Aortic atherosclerosis. Electronically Signed   By: Lajean Manes M.D.   On: 11/18/2020 12:55    ____________________________________________  PROCEDURES   Procedure(s) performed (including Critical Care):  Procedures  ____________________________________________  INITIAL IMPRESSION / MDM / [place] / ED COURSE  As part of my medical decision making, I reviewed the following data within the [facility] notes reviewed and incorporated, Old chart reviewed, Notes from prior ED visits, and [place] Controlled Substance Database       *Joshua [name] was evaluated in Emergency Department on 11/18/2020 for the symptoms described in the history of present illness. He was evaluated in the context of the global COVID-19 pandemic, which necessitated consideration that the patient might be at risk for infection with the SARS-CoV-2 virus that causes COVID-19. Institutional protocols and algorithms that pertain to the evaluation of patients at risk for  COVID-19 are in a state of rapid change based on information released by regulatory bodies including the CDC and federal and state organizations. These policies and algorithms were followed during the patient's care in the ED.  Some ED evaluations and interventions may be delayed as a result of limited staffing during the pandemic.*     Medical Decision Making: 60 year old male here with right flank pain.  Suspect musculoskeletal flank pain.  I suspect this is related to his change in gait related to his prior brain surgeries and use of a cane.  The pain is reproducible and positional.  CT imaging obtained, reviewed, shows no evidence of acute abnormality.  Specifically no evidence of stone or renal mass.  He has no microscopic or macroscopic hematuria.  CBC and BMP are unremarkable.  Renal function is normal.  No focal anterior abdominal tenderness or signs of cholecystitis, hepatitis, or other abnormality.  Will discharge with outpatient analgesia and muscle relaxants, outpatient follow-up.  ____________________________________________  FINAL CLINICAL IMPRESSION(S) / ED DIAGNOSES  Final diagnoses:  Flank pain     MEDICATIONS GIVEN DURING THIS VISIT:  Medications  oxyCODONE-acetaminophen (PERCOCET/ROXICET) 5-325 MG per tablet 2 tablet (2 tablets Oral Given 11/18/20 1201)  ondansetron (ZOFRAN-ODT) disintegrating tablet 4 mg (4 mg Oral Given 11/18/20 1201)  ketorolac (TORADOL) injection 60 mg (60 mg Intramuscular Given 11/18/20 1200)     ED Discharge Orders         Ordered    methocarbamol (ROBAXIN) 500 MG tablet  3 times daily        11/18/20 1329    meloxicam (MOBIC) 7.5 MG tablet  Daily        11/18/20 1329           Note:  This document was prepared using Dragon voice recognition software and may include unintentional dictation errors.   Duffy Bruce, MD 11/18/20 1426

## 2020-11-18 NOTE — ED Triage Notes (Signed)
Pt c/o right flank pain with dark colored urine for the past 2 weeks, denies hx of kidney stones in the past, pt is in NAD at present.

## 2020-11-20 ENCOUNTER — Ambulatory Visit: Payer: Self-pay | Admitting: Physician Assistant

## 2020-11-21 ENCOUNTER — Emergency Department
Admission: EM | Admit: 2020-11-21 | Discharge: 2020-11-21 | Disposition: A | Discharge status: Home / Self Care | Payer: Medicaid Other | Attending: Emergency Medicine | Admitting: Emergency Medicine

## 2020-11-21 ENCOUNTER — Emergency Department: Payer: Medicaid Other

## 2020-11-21 ENCOUNTER — Other Ambulatory Visit: Payer: Self-pay

## 2020-11-21 DIAGNOSIS — Z7982 Long term (current) use of aspirin: Secondary | ICD-10-CM | POA: Insufficient documentation

## 2020-11-21 DIAGNOSIS — T148XXA Other injury of unspecified body region, initial encounter: Secondary | ICD-10-CM

## 2020-11-21 DIAGNOSIS — S29011A Strain of muscle and tendon of front wall of thorax, initial encounter: Secondary | ICD-10-CM | POA: Diagnosis not present

## 2020-11-21 DIAGNOSIS — X58XXXA Exposure to other specified factors, initial encounter: Secondary | ICD-10-CM | POA: Insufficient documentation

## 2020-11-21 DIAGNOSIS — S299XXA Unspecified injury of thorax, initial encounter: Secondary | ICD-10-CM | POA: Diagnosis present

## 2020-11-21 DIAGNOSIS — Z79899 Other long term (current) drug therapy: Secondary | ICD-10-CM | POA: Diagnosis not present

## 2020-11-21 DIAGNOSIS — R0789 Other chest pain: Secondary | ICD-10-CM

## 2020-11-21 DIAGNOSIS — Z85841 Personal history of malignant neoplasm of brain: Secondary | ICD-10-CM | POA: Insufficient documentation

## 2020-11-21 DIAGNOSIS — C700 Malignant neoplasm of cerebral meninges: Secondary | ICD-10-CM

## 2020-11-21 DIAGNOSIS — R079 Chest pain, unspecified: Secondary | ICD-10-CM | POA: Diagnosis not present

## 2020-11-21 DIAGNOSIS — I10 Essential (primary) hypertension: Secondary | ICD-10-CM | POA: Insufficient documentation

## 2020-11-21 DIAGNOSIS — Z87891 Personal history of nicotine dependence: Secondary | ICD-10-CM | POA: Insufficient documentation

## 2020-11-21 DIAGNOSIS — R21 Rash and other nonspecific skin eruption: Secondary | ICD-10-CM | POA: Diagnosis not present

## 2020-11-21 LAB — BASIC METABOLIC PANEL
Anion gap: 6 (ref 5–15)
BUN: 12 mg/dL (ref 6–20)
CO2: 26 mmol/L (ref 22–32)
Calcium: 9.1 mg/dL (ref 8.9–10.3)
Chloride: 108 mmol/L (ref 98–111)
Creatinine, Ser: 0.89 mg/dL (ref 0.61–1.24)
GFR, Estimated: >60 mL/min (ref >60)
Glucose, Bld: 133 mg/dL — ABNORMAL HIGH (ref 70–99)
Potassium: 3.7 mmol/L (ref 3.5–5.1)
Sodium: 140 mmol/L (ref 135–145)

## 2020-11-21 LAB — CBC
HCT: 42.2 % (ref 39.0–52.0)
Hemoglobin: 14.4 g/dL (ref 13.0–17.0)
MCH: 30.3 pg (ref 26.0–34.0)
MCHC: 34.1 g/dL (ref 30.0–36.0)
MCV: 88.7 fL (ref 80.0–100.0)
Platelets: 274 10*3/uL (ref 150–400)
RBC: 4.76 MIL/uL (ref 4.22–5.81)
RDW: 13 % (ref 11.5–15.5)
WBC: 6.8 10*3/uL (ref 4.0–10.5)
nRBC: 0 % (ref 0.0–0.2)

## 2020-11-21 LAB — TROPONIN I (HIGH SENSITIVITY)
Troponin I (High Sensitivity): 4 ng/L (ref <18)
Troponin I (High Sensitivity): 4 ng/L (ref <18)

## 2020-11-21 MED ORDER — MELOXICAM 7.5 MG PO TABS: 7.5000 mg | ORAL_TABLET | Freq: Every day | ORAL | 0 refills | Status: AC

## 2020-11-21 MED ORDER — KETOROLAC TROMETHAMINE 30 MG/ML IJ SOLN
15.0000 mg | Freq: Once | INTRAMUSCULAR | Status: AC
  Administered 2020-11-21: 15 mg via INTRAVENOUS
  Filled 2020-11-21: qty 1

## 2020-11-21 MED ORDER — CLONAZEPAM 0.5 MG PO TABS: 0.5000 mg | ORAL_TABLET | Freq: Two times a day (BID) | ORAL | 0 refills | Status: DC

## 2020-11-21 NOTE — Discharge Instructions (Addendum)
Go back to taking your Klonopin 0.5 mg twice a day.  I have prescribed an additional 2-week course so that you can have enough to make it to your neurologist appointment.  Take the Mobic that I have prescribed.  I called this in for an additional 7 days.  Take this with food in the mornings.  Stop the Robaxin if you are increasing her Klonopin, to prevent oversedation.  No heavy lifting greater than 10 pounds for the next week.

## 2020-11-21 NOTE — ED Triage Notes (Addendum)
5/4 here for abd pain, was given robaxin and mobic. Took these new meds and clonazepam yest and developed intermittent L chest into L shoulder pain.

## 2020-11-21 NOTE — ED Provider Notes (Signed)
[facility] Emergency Department Provider Note  ____________________________________________   Event Date/Time   First MD Initiated Contact with Patient 11/21/20 2028     (approximate)  I have reviewed the triage vital signs and the nursing notes.   HISTORY  Chief Complaint Chest Pain    HPI Joshua [name] is a 60 y.o. male with history of anxiety, prior brain tumor, here with left neck and chest wall pain.  The patient states that he has been having to sleep in different positions due to musculoskeletal right flank pain, for which she was recently seen.  Has been taking Robaxin and Mobic and this is improved his right flank pain.  He had a seizure 2 days ago.  This is not uncommon for him and he did recently have his Klonopin decreased.  He has been taking his Klonopin since then.  He reports that after this, he has been very anxious and has not been sleeping much.  He has been sitting upright in bed.  He reports that he has since had aching, reproducible, left upper chest and paraspinal neck pain.  This is worse with palpation and movement.  He has been able to flex his left pec muscle which is new for him.  Denies any numbness or weakness.  No recurrent seizures.  No fevers or chills.  No other complaints.        Past Medical History:  Diagnosis Date  . Anxiety   . Brain tumor ([place]) 02/20/2013   brain tumor removed in March 2014, Dr Donald Pore  . Diffuse idiopathic skeletal hyperostosis 06/05/2013  . Dizziness   . Enlarged prostate   . GERD (gastroesophageal reflux disease)   . Headache(784.0)    scattered  . High cholesterol   . Hypertension   . 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 Meningioma ([place])   . Meningioma, recurrent of brain ([place]) 01/31/2019  . Neuropathy 12/05/2019  . Rotator cuff tear    right  . Seizures ([place])    11/27/19 last sz 1 wk ago    Patient Active Problem List   Diagnosis Date Noted  . Essential  hypertension 07/02/2020  . Prediabetes 07/02/2020  . Polyuria 07/02/2020  . Nontraumatic complete tear of right rotator cuff   . Impingement syndrome of right shoulder   . Neuropathy 12/05/2019  . 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 Seizures ([place]) 01/31/2019  . Meningioma, recurrent of brain ([place]) 01/31/2019  . Meningioma ([place]) 01/03/2019  . Diffuse idiopathic skeletal hyperostosis 06/05/2013    Past Surgical History:  Procedure Laterality Date  . APPLICATION OF CRANIAL NAVIGATION N/A 01/10/2019   Procedure: APPLICATION OF CRANIAL NAVIGATION;  Surgeon: Erline Levine, MD;  Location: [facility];  Service: Neurosurgery;  Laterality: N/A;  . CATARACT EXTRACTION Right   . CRANIOTOMY Right 11/06/2012   Procedure: CRANIOTOMY TUMOR EXCISION;  Surgeon: Erline Levine, MD;  Location: [facility];  Service: Neurosurgery;  Laterality: Right;  Right Parasagittal craniotomy for meningioma with Stealth  . CRANIOTOMY Right 01/10/2019   Procedure: Right Parasagittal Craniotomy for Tumor;  Surgeon: Erline Levine, MD;  Location: [facility];  Service: Neurosurgery;  Laterality: Right;  Right parasagittal craniotomy for tumor  . ESOPHAGOGASTRODUODENOSCOPY    . right knee arthroscopy    . SHOULDER ARTHROSCOPY Right 06/09/2020   Procedure: RIGHT SHOULDER ARTHROSCOPY AND DEBRIDEMENT;  Surgeon: Newt Minion, MD;  Location: [facility];  Service: Orthopedics;  Laterality: Right;  Prior to Admission medications   Medication Sig Start Date End Date Taking? Authorizing Provider  Accu-Chek Softclix Lancets lancets Use as instructed 07/08/20   Mayers, Cari S, PA-C  albuterol (VENTOLIN HFA) 108 (90 Base) MCG/ACT inhaler Inhale 2 puffs into the lungs every 6 (six) hours as needed for wheezing or shortness of breath. 09/06/20   Merlyn Lot, MD  amLODipine (NORVASC) 5 MG tablet Take 1 tablet (5 mg total) by mouth daily. To lower blood pressure 10/05/20   Charlott Rakes, MD  aspirin  81 MG chewable tablet     [provider]  atorvastatin (LIPITOR) 20 MG tablet TAKE 1 TABLET BY MOUTH EVERY DAY 09/25/20   Charlott Rakes, MD  benzonatate (TESSALON) 100 MG capsule Take 1 capsule (100 mg total) by mouth 2 (two) times daily as needed for cough. 09/10/20   Charlott Rakes, MD  clonazePAM (KLONOPIN) 0.5 MG tablet Take 1 tablet (0.5 mg total) by mouth 2 (two) times daily for 14 days. 11/21/20 12/05/20  Duffy Bruce, MD  glucose blood (ACCU-CHEK AVIVA PLUS) test strip Use as instructed 07/08/20   Mayers, Cari S, PA-C  Lacosamide (VIMPAT) 100 MG TABS TAKE 2 TABLETS IN MORNING AND 2 AT BEDTIME 09/29/20   Vaslow, Acey Lav, MD  lamoTRIgine (LAMICTAL) 100 MG tablet TAKE 2 TABLETS BY MOUTH TWICE A DAY 11/13/20   Vaslow, Acey Lav, MD  meloxicam (MOBIC) 7.5 MG tablet Take 1 tablet (7.5 mg total) by mouth daily for 7 days. 11/21/20 11/28/20  Duffy Bruce, MD  Misc. Devices MISC Please provide BP cuff for patient 07/08/20   Argentina Donovan, PA-C  ondansetron (ZOFRAN) 4 MG tablet Take 1 tablet (4 mg total) by mouth every 8 (eight) hours as needed for nausea or vomiting. 07/31/20   Swords, Darrick Penna, MD  pantoprazole (PROTONIX) 20 MG tablet Take 1 tablet (20 mg total) by mouth 2 (two) times daily before a meal. 0/86/57 8/46/96  Jodi Marble, MD  tamsulosin (FLOMAX) 0.4 MG CAPS capsule Take 1 capsule (0.4 mg total) by mouth daily. 07/23/20   Billey Co, MD  traMADol (ULTRAM) 50 MG tablet Take 1 tablet (50 mg total) by mouth every 12 (twelve) hours as needed. 10/12/20 10/12/21  Magnant, Gerrianne Scale, PA-C    Allergies Patient has no known allergies.  Family History  Problem Relation Age of Onset  . Hypertension Mother   . Dementia Mother   . Diabetes Mother   . Hypertension Father   . CAD Father   . Diabetes Father   . CAD Brother     Social History Social History   Tobacco Use  . Smoking status: Former Smoker    Types: Cigarettes    Quit date: 01/29/2019    Years since  quitting: 1.8  . Smokeless tobacco: Never Used  . Tobacco comment: weekend smoker  Vaping Use  . Vaping Use: Never used  Substance Use Topics  . Alcohol use: Not Currently    Comment: social  . Drug use: No    Review of Systems  Review of Systems  Constitutional: Negative for chills and fever.  HENT: Negative for sore throat.   Respiratory: Negative for shortness of breath.   Cardiovascular: Positive for chest pain.  Gastrointestinal: Negative for abdominal pain.  Genitourinary: Negative for flank pain.  Musculoskeletal: Positive for arthralgias, neck pain and neck stiffness.  Skin: Negative for rash and wound.  Allergic/Immunologic: Negative for immunocompromised state.  Neurological: Negative for weakness and numbness.  Hematological: Does not  bruise/bleed easily.  All other systems reviewed and are negative.    ____________________________________________  PHYSICAL EXAM:      VITAL SIGNS: ED Triage Vitals  Enc Vitals Group     BP 11/21/20 1916 136/89     Pulse Rate 11/21/20 1916 84     Resp 11/21/20 1916 16     Temp 11/21/20 1916 98.3 F (36.8 C)     Temp Source 11/21/20 1916 Oral     SpO2 11/21/20 1916 95 %     Weight 11/21/20 1907 180 lb 12.4 oz (82 kg)     Height 11/21/20 1907 5\' 9"  (1.753 m)     Head Circumference --      Peak Flow --      Pain Score 11/21/20 1906 8     Pain Loc --      Pain Edu? --      Excl. in [place]? --      Physical Exam Vitals and nursing note reviewed.  Constitutional:      General: He is not in acute distress.    Appearance: He is well-developed.  HENT:     Head: Normocephalic and atraumatic.  Eyes:     Conjunctiva/sclera: Conjunctivae normal.  Neck:     Comments: Mild tenderness to palpation and stiffness along the left paraspinal musculature and left upper trapezius. Cardiovascular:     Rate and Rhythm: Normal rate and regular rhythm.     Heart sounds: Normal heart sounds.  Pulmonary:     Effort: Pulmonary effort is  normal. No respiratory distress.     Breath sounds: No wheezing.  Chest:     Comments: Tenderness to palpation of the left pectoralis muscle, with slight increased tone.  Pain reproduced upon flexion of the pec muscle or palpation. Abdominal:     General: There is no distension.  Musculoskeletal:     Cervical back: Neck supple.  Skin:    General: Skin is warm.     Capillary Refill: Capillary refill takes less than 2 seconds.     Findings: No rash.  Neurological:     Mental Status: He is alert and oriented to person, place, and time.     Motor: No abnormal muscle tone.       ____________________________________________   LABS (all labs ordered are listed, but only abnormal results are displayed)  Labs Reviewed  BASIC METABOLIC PANEL - Abnormal; Notable for the following components:      Result Value   Glucose, Bld 133 (*)    All other components within normal limits  CBC  TROPONIN I (HIGH SENSITIVITY)  TROPONIN I (HIGH SENSITIVITY)    ____________________________________________  EKG: Normal sinus rhythm, ventricular 98.  PR 162, QRS 106, QTc 459.  No acute ST elevations depression.  EKG evidence of acute ischemia or infarct. ________________________________________  RADIOLOGY All imaging, including plain films, CT scans, and ultrasounds, independently reviewed by me, and interpretations confirmed via formal radiology reads.  ED MD interpretation:   Chest x-ray: Clear  Official radiology report(s): DG Chest 2 View  Result Date: 11/21/2020 CLINICAL DATA:  60 year old male with chest pain. EXAM: CHEST - 2 VIEW COMPARISON:  Chest radiograph dated 09/21/2020. FINDINGS: No focal consolidation, pleural effusion, pneumothorax. The cardiac silhouette is within limits. Atherosclerotic calcification of the aorta. Degenerative changes of the spine. No acute osseous pathology. IMPRESSION: No active cardiopulmonary disease. Electronically Signed   By: Anner Crete M.D.   On:  11/21/2020 19:58    ____________________________________________  PROCEDURES  Procedure(s) performed (including Critical Care):  Procedures  ____________________________________________  INITIAL IMPRESSION / MDM / ASSESSMENT AND PLAN / ED COURSE  As part of my medical decision making, I reviewed the following data within the [place] notes reviewed and incorporated, Old chart reviewed, Notes from prior ED visits, and [place] Controlled Substance Database       *Joshua [name] was evaluated in Emergency Department on 11/22/2020 for the symptoms described in the history of present illness. He was evaluated in the context of the global COVID-19 pandemic, which necessitated consideration that the patient might be at risk for infection with the SARS-CoV-2 virus that causes COVID-19. Institutional protocols and algorithms that pertain to the evaluation of patients at risk for COVID-19 are in a state of rapid change based on information released by regulatory bodies including the CDC and federal and state organizations. These policies and algorithms were followed during the patient's care in the ED.  Some ED evaluations and interventions may be delayed as a result of limited staffing during the pandemic.*     Medical Decision Making:  60 yo M here with left lateral chest pain and trapezius/neck pain. Suspect this is MSK in etiology - reproduces on exam with pinpoint TTP over pec, trapezius. Pt had a recent seizure which could have triggered MSK strain, versus pain related to his chronic gaint instability/abnormality. EKG is nonischemic, trop neg x 2 and CXR is clear. No signs of ACS, PNA, PE, dissection. Labs are o/w reassuring. Pt also very anxious and reports this may be contributing.   Re: his recent seizure, he is on klonopin, which was cut back from bid to qd. Will have him resume bid dosing and call Neurologist. Re: his MSK pain, will have him continue Mobic, monitor  gait and reduce heavy lifting.  ____________________________________________  FINAL CLINICAL IMPRESSION(S) / ED DIAGNOSES  Final diagnoses:  Muscle strain  Atypical chest pain     MEDICATIONS GIVEN DURING THIS VISIT:  Medications  ketorolac (TORADOL) 30 MG/ML injection 15 mg (15 mg Intravenous Given 11/21/20 2126)     ED Discharge Orders         Ordered    clonazePAM (KLONOPIN) 0.5 MG tablet  2 times daily        11/21/20 2208    meloxicam (MOBIC) 7.5 MG tablet  Daily        11/21/20 2208           Note:  This document was prepared using Dragon voice recognition software and may include unintentional dictation errors.   Duffy Bruce, MD 11/22/20 [phone]

## 2020-11-21 NOTE — ED Notes (Signed)
Patient to room 10

## 2020-11-23 ENCOUNTER — Telehealth: Payer: Self-pay | Admitting: Family Medicine

## 2020-11-23 NOTE — Telephone Encounter (Signed)
Copied from [place] [id number]. Topic: General - Other >> Nov 23, 2020 12:04 PM Keene Breath wrote: Reason for CRM: Patient would like the nurse to call regarding a medication that he takes to stabilize his blood sugar.  He did not remember the name of the medication, but he has some questions.  Please call to discuss at [phone]

## 2020-11-23 NOTE — Telephone Encounter (Signed)
Pt was called and a VM was left informing patient to return phone call for more information.

## 2020-11-24 ENCOUNTER — Ambulatory Visit: Payer: Self-pay | Admitting: Physician Assistant

## 2020-11-24 ENCOUNTER — Ambulatory Visit: Admit: 2020-11-24 | Payer: Medicaid Other | Admitting: Orthopedic Surgery

## 2020-11-24 SURGERY — ARTHROPLASTY, SHOULDER, TOTAL, REVERSE
Anesthesia: General | Site: Shoulder | Laterality: Right

## 2020-11-25 ENCOUNTER — Telehealth: Payer: Self-pay | Admitting: Orthopedic Surgery

## 2020-11-25 NOTE — Telephone Encounter (Signed)
See below

## 2020-11-25 NOTE — Telephone Encounter (Signed)
Patient called asked if Dr Marlou Sa will call him concerning his surgery,Pt and water therapy. The number to contact patient is [phone]

## 2020-11-26 ENCOUNTER — Other Ambulatory Visit: Payer: Self-pay

## 2020-11-26 ENCOUNTER — Ambulatory Visit (INDEPENDENT_AMBULATORY_CARE_PROVIDER_SITE_OTHER): Payer: Medicaid Other | Admitting: Physician Assistant

## 2020-11-26 ENCOUNTER — Telehealth: Payer: Self-pay | Admitting: Orthopedic Surgery

## 2020-11-26 ENCOUNTER — Encounter: Payer: Self-pay | Admitting: Physician Assistant

## 2020-11-26 VITALS — BP 141/82 | HR 85 | Ht 69.0 in | Wt 192.0 lb

## 2020-11-26 DIAGNOSIS — N400 Enlarged prostate without lower urinary tract symptoms: Secondary | ICD-10-CM

## 2020-11-26 LAB — BLADDER SCAN AMB NON-IMAGING

## 2020-11-26 NOTE — Telephone Encounter (Signed)
Patient left message on voicemail in reference to contacting Dr. Marlou Sa about water therapy.  Patient states Dr. Marlou Sa gave him Botox injections last month and discussed water therapy yet has not heard from anyone.    Pt's cb  [phone]

## 2020-11-26 NOTE — Progress Notes (Signed)
11/26/2020 2:57 PM   Joshua [name] 02/12/1961 [id number]  CC: Chief Complaint  Patient presents with  . Follow-up  . Benign Prostatic Hypertrophy    HPI: Joshua [name] is a 60 y.o. male with who presents today for symptom recheck and PVR on Flomax.  Today he reports feeling well. He reports improvement in his sensation of incomplete emptying on Flomax. Additionally, his single episode of nocturia is happening later at night and interrupting his sleep less. He reports mild post-void dribbling that he attributes to age. He is tolerating Flomax without orthostasis and wishes to continue this med. IPSS 2/pleased as below. PVR 40mL.   IPSS    Row Name 11/26/20 1400         International Prostate Symptom Score   How often have you had the sensation of not emptying your bladder? Not at All     How often have you had to urinate less than every two hours? Less than 1 in 5 times     How often have you found you stopped and started again several times when you urinated? Less than 1 in 5 times     How often have you found it difficult to postpone urination? Not at All     How often have you had a weak urinary stream? Not at All     How often have you had to strain to start urination? Not at All     How many times did you typically get up at night to urinate? None     Total IPSS Score 2           Quality of Life due to urinary symptoms   If you were to spend the rest of your life with your urinary condition just the way it is now how would you feel about that? Pleased            PMH: Past Medical History:  Diagnosis Date  . Anxiety   . Brain tumor ([place]) 02/20/2013   brain tumor removed in March 2014, Dr Donald Pore  . Diffuse idiopathic skeletal hyperostosis 06/05/2013  . Dizziness   . Enlarged prostate   . GERD (gastroesophageal reflux disease)   . Headache(784.0)    scattered  . High cholesterol   . Hypertension   . 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[place])  03/07/2019  . Meningioma ([place])   . Meningioma, recurrent of brain ([place]) 01/31/2019  . Neuropathy 12/05/2019  . Rotator cuff tear    right  . Seizures ([place])    11/27/19 last sz 1 wk ago    Surgical History: Past Surgical History:  Procedure Laterality Date  . APPLICATION OF CRANIAL NAVIGATION N/A 01/10/2019   Procedure: APPLICATION OF CRANIAL NAVIGATION;  Surgeon: Erline Levine, MD;  Location: [facility];  Service: Neurosurgery;  Laterality: N/A;  . CATARACT EXTRACTION Right   . CRANIOTOMY Right 11/06/2012   Procedure: CRANIOTOMY TUMOR EXCISION;  Surgeon: Erline Levine, MD;  Location: [facility];  Service: Neurosurgery;  Laterality: Right;  Right Parasagittal craniotomy for meningioma with Stealth  . CRANIOTOMY Right 01/10/2019   Procedure: Right Parasagittal Craniotomy for Tumor;  Surgeon: Erline Levine, MD;  Location: [facility];  Service: Neurosurgery;  Laterality: Right;  Right parasagittal craniotomy for tumor  . ESOPHAGOGASTRODUODENOSCOPY    . right knee arthroscopy    . SHOULDER ARTHROSCOPY Right 06/09/2020   Procedure: RIGHT SHOULDER ARTHROSCOPY AND DEBRIDEMENT;  Surgeon: Newt Minion, MD;  Location: [facility];  Service: Orthopedics;  Laterality: Right;    Home Medications:  Allergies as of 11/26/2020   No Known Allergies     Medication List       Accurate as of Nov 26, 2020  2:57 PM. If you have any questions, ask your nurse or doctor.        Accu-Chek Aviva Plus test strip Generic drug: glucose blood Use as instructed   Accu-Chek Softclix Lancets lancets Use as instructed   albuterol 108 (90 Base) MCG/ACT inhaler Commonly known as: VENTOLIN HFA Inhale 2 puffs into the lungs every 6 (six) hours as needed for wheezing or shortness of breath.   amLODipine 5 MG tablet Commonly known as: NORVASC Take 1 tablet (5 mg total) by mouth daily. To lower blood pressure   aspirin 81 MG chewable tablet   atorvastatin 20 MG tablet Commonly known as: LIPITOR TAKE 1  TABLET BY MOUTH EVERY DAY   benzonatate 100 MG capsule Commonly known as: TESSALON Take 1 capsule (100 mg total) by mouth 2 (two) times daily as needed for cough.   clonazePAM 0.5 MG tablet Commonly known as: KLONOPIN Take 1 tablet (0.5 mg total) by mouth 2 (two) times daily for 14 days.   lamoTRIgine 100 MG tablet Commonly known as: LAMICTAL TAKE 2 TABLETS BY MOUTH TWICE A DAY   meloxicam 7.5 MG tablet Commonly known as: Mobic Take 1 tablet (7.5 mg total) by mouth daily for 7 days.   metFORMIN 500 MG tablet Commonly known as: GLUCOPHAGE Take 1 tablet by mouth 2 (two) times daily.   Misc. Devices Misc Please provide BP cuff for patient   ondansetron 4 MG tablet Commonly known as: Zofran Take 1 tablet (4 mg total) by mouth every 8 (eight) hours as needed for nausea or vomiting.   pantoprazole 20 MG tablet Commonly known as: Protonix Take 1 tablet (20 mg total) by mouth 2 (two) times daily before a meal.   tamsulosin 0.4 MG Caps capsule Commonly known as: FLOMAX Take 1 capsule (0.4 mg total) by mouth daily.   traMADol 50 MG tablet Commonly known as: Ultram Take 1 tablet (50 mg total) by mouth every 12 (twelve) hours as needed.   Vimpat 100 MG Tabs Generic drug: Lacosamide TAKE 2 TABLETS IN MORNING AND 2 AT BEDTIME       Allergies:  No Known Allergies  Family History: Family History  Problem Relation Age of Onset  . Hypertension Mother   . Dementia Mother   . Diabetes Mother   . Hypertension Father   . CAD Father   . Diabetes Father   . CAD Brother     Social History:   reports that he quit smoking about 21 months ago. His smoking use included cigarettes. He has never used smokeless tobacco. He reports previous alcohol use. He reports that he does not use drugs.  Physical Exam: BP (!) 141/82   Pulse 85   Ht 5\' 9"  (1.753 m)   Wt 192 lb (87.1 kg)   BMI 28.35 kg/m   Constitutional:  Alert and oriented, no acute distress, nontoxic appearing HEENT:  [place], AT Cardiovascular: No clubbing, cyanosis, or edema Respiratory: Normal respiratory effort, no increased work of breathing Skin: No rashes, bruises or suspicious lesions Neurologic: Grossly intact, no focal deficits, moving all 4 extremities Psychiatric: Normal mood and affect  Laboratory Data: Results for orders placed or performed in visit on 11/26/20  Bladder Scan (Post Void Residual) in office  Result Value Ref Range  Scan Result 53mL    Assessment & Plan:   1. Benign prostatic hyperplasia without lower urinary tract symptoms Minimally symptomatic and tolerating Flomax well. Patient wishes to continue Flomax and I am in agreement with this plan. Patient wishes to continue care in our clinic; will plan for annual visits, sooner if needed. - Bladder Scan (Post Void Residual) in office  Return in about 1 year (around 11/26/2021) for Annual DRE/IPSS/PVR with PSA prior.  Debroah Loop, PA-C  [facility] [address], [place], [place] [address] [phone]

## 2020-11-26 NOTE — Telephone Encounter (Signed)
Patient left message on voice mail 11-25-20 stating he is having trouble with the left leg and it is getting weaker and weaker. He states he is scheduled to see his PCP on 24th of May. He would like to know if his doctor clears him will Dr. Marlou Sa do his surgery.     cb  [phone]

## 2020-11-27 ENCOUNTER — Telehealth: Payer: Self-pay | Admitting: *Deleted

## 2020-11-27 ENCOUNTER — Ambulatory Visit: Payer: Self-pay

## 2020-11-27 NOTE — Telephone Encounter (Signed)
Pls advise.  

## 2020-11-27 NOTE — Telephone Encounter (Signed)
Pt was called and his questions were answered.

## 2020-11-27 NOTE — Telephone Encounter (Signed)
Copied from [place] [id number]. Topic: Quick Communication - Office Called Patient (Clinic Use ONLY) >> Nov 27, 2020 10:19 AM Lennox Solders wrote: Reason for CRM: I am unable to addend triage nurse message. Pt is returning alycia call

## 2020-11-27 NOTE — Telephone Encounter (Signed)
Received call from patient with medication questions. Called pt and spoke with him. He states he has since spoken with his PCP-it was about his new prescription for metformin. His questions were answered per his PCP office.

## 2020-11-27 NOTE — Telephone Encounter (Signed)
Pt was called and a VM was left informing patient to return phone call.

## 2020-11-27 NOTE — Telephone Encounter (Signed)
   [address] Tenn[place] Male, 60 y.o., 1961/06/28  MRN:  [id number] Phone:  [phone] Jerilynn Mages)       PCP:  Charlott Rakes, MD Coverage:  Medicaid [place]/Medicaid [place] [facility] 12/01/2020 at 10:30 AM         Message from Scherrie Gerlach sent at 11/27/2020 8:05 AM EDT  Pt wants to know, "should I take my medication and then check my blood sugar? I do not take medication for diabetes. But I was told at last visit I am pre diabetic."    Call History   Type Contact Phone/Fax User  11/27/2020 08:01 AM EDT Phone (Incoming) Dashiel, [place] (Self) [phone] Lemmie Evens) Scherrie Gerlach  Pt. asking how often and when he should check his blood sugar. Also states if he takes Metformin on empty stomach causes nausea and dizziness. Please advise pt.   Answer Assessment - Initial Assessment Questions 1. NAME of MEDICATION: "What medicine are you calling about?"     Metformin 2. QUESTION: "What is your question?" (e.g., double dose of medicine, side effect)     When should he check his blood 3. PRESCRIBING HCP: "Who prescribed it?" Reason: if prescribed by specialist, call should be referred to that group.     Dr. Margarita Rana 4. SYMPTOMS: "Do you have any symptoms?"     Some nausea if taken on empty stomach 5. SEVERITY: If symptoms are present, ask "Are they mild, moderate or severe?"     Mild 6. PREGNANCY:  "Is there any chance that you are pregnant?" "When was your last menstrual period?"     n/a  Protocols used: MEDICATION QUESTION CALL-A-AH

## 2020-11-28 NOTE — Telephone Encounter (Signed)
He may need his leg weakness that he has is worked up as well prior to shoulder surgery.

## 2020-11-28 NOTE — Telephone Encounter (Signed)
[place] for water therapy

## 2020-11-28 NOTE — Telephone Encounter (Signed)
He has.  Essentially needs medical clearance and restratification which includes albumin and hemoglobin as well as a place to go 1 day after surgery.

## 2020-11-30 ENCOUNTER — Encounter: Payer: Self-pay | Admitting: Family Medicine

## 2020-11-30 ENCOUNTER — Ambulatory Visit: Payer: Self-pay | Admitting: *Deleted

## 2020-11-30 NOTE — Telephone Encounter (Signed)
Patient called and advised I'm returning the call to discuss his blood sugar number prior to surgery. I asked about his blood sugar readings at home. He says they range from 100-113 before eating and taking Metformin and the same range 2 hours after. He says that the surgeon told him his hemoglobin. I advised that's not blood sugar. I advised the last HgB was within normal range and that both HgB and Glucose are within range. He verbalized understanding and appreciated the explanation.  Reason for Disposition . Health Information question, no triage required and triager able to answer question  Answer Assessment - Initial Assessment Questions 1. REASON FOR CALL or QUESTION: "What is your reason for calling today?" or "How can I best help you?" or "What question do you have that I can help answer?"     Calling to find out about blood sugar range before surgery  Protocols used: [place]

## 2020-11-30 NOTE — Telephone Encounter (Signed)
IC discussed with patient. He would like to hold off on this for now. He has Medicaid and they only cover very few PT visits per year and he would like to save these for post op care.

## 2020-11-30 NOTE — Telephone Encounter (Signed)
I spoke with patient and advise. He sees his PCP on 05/24.

## 2020-11-30 NOTE — Telephone Encounter (Signed)
See previous 2 notes for further documentation.

## 2020-11-30 NOTE — Telephone Encounter (Signed)
Patient called, left VM to return the call to the office to discuss his blood sugar.   Pt is calling to to ask advice that he his having surgery 12/15/20. Pt would like to know what level should his glucose be at to have the surgery. Pt states that the surgeon stated that the patients pcp can give him the answer.

## 2020-11-30 NOTE — Telephone Encounter (Signed)
This encounter was created in error - please disregard.

## 2020-12-01 ENCOUNTER — Inpatient Hospital Stay: Payer: Medicaid Other | Attending: Internal Medicine | Admitting: Internal Medicine

## 2020-12-01 DIAGNOSIS — C700 Malignant neoplasm of cerebral meninges: Secondary | ICD-10-CM

## 2020-12-01 DIAGNOSIS — R569 Unspecified convulsions: Secondary | ICD-10-CM

## 2020-12-01 DIAGNOSIS — C709 Malignant neoplasm of meninges, unspecified: Secondary | ICD-10-CM | POA: Insufficient documentation

## 2020-12-01 MED ORDER — LACOSAMIDE 100 MG PO TABS: ORAL_TABLET | ORAL | 3 refills | Status: DC

## 2020-12-01 NOTE — Progress Notes (Signed)
I connected with Joshua [name] on 12/01/20 at 10:30 AM EDT by telephone visit and verified that I am speaking with the correct person using two identifiers.  I discussed the limitations, risks, security and privacy concerns of performing an evaluation and management service by telemedicine and the availability of in-person appointments. I also discussed with the patient that there may be a patient responsible charge related to this service. The patient expressed understanding and agreed to proceed.   Other persons participating in the visit and their role in the encounter:  n/a  Patient's location:  Home  Provider's location:  Office   Chief Complaint:  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[place])  Seizures ([place])   History of Present Ilness: Joshua [name] describes no new or progressive neurologic deficits.  He did not tolerate the withdrawal of Klonopin well, as he had several recurrent seizures on the 0.5mg  daily dose.  He is currently dosing twice a day with no recurrence of seizure events.    Observations: Language and cognition at baseline  Assessment and Plan: 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)  Seizures ([place])   Recommend continuing current regimen: Vimpat 200mg  BID Lamictal 200mg  BID Klonopin 0.5mg  BID  Follow Up Instructions: Follow up in September after repeat MRI brain study  I discussed the assessment and treatment plan with the patient.  The patient was provided an opportunity to ask questions and all were answered.  The patient agreed with the plan and demonstrated understanding of the instructions.    The patient was advised to call back or seek an in-person evaluation if the symptoms worsen or if the condition fails to improve as anticipated.  I provided 15 minutes of non-face-to-face time during this enocunter.  [name], MD   I provided 15 minutes of non face-to-face telephone visit time during this encounter, and > 50% was spent  counseling as documented under my assessment & plan.

## 2020-12-03 ENCOUNTER — Other Ambulatory Visit: Payer: Self-pay

## 2020-12-03 ENCOUNTER — Encounter: Payer: Medicaid Other | Attending: Physical Medicine & Rehabilitation | Admitting: Physical Medicine & Rehabilitation

## 2020-12-03 ENCOUNTER — Encounter: Payer: Self-pay | Admitting: Physical Medicine & Rehabilitation

## 2020-12-03 VITALS — BP 129/83 | HR 78 | Temp 98.7°F | Ht 69.0 in | Wt 194.0 lb

## 2020-12-03 DIAGNOSIS — G8310 Monoplegia of lower limb affecting unspecified side: Secondary | ICD-10-CM | POA: Diagnosis not present

## 2020-12-03 NOTE — Patient Instructions (Signed)
Will add Botox to Calf muscle and to big toe extensor muscle

## 2020-12-03 NOTE — Progress Notes (Signed)
Subjective:    Patient ID: Joshua [name], male    DOB: Jan 31, 1961, 60 y.o.   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  This is affecting lower extremity greater than upper extremity.  In addition the patient has degenerative arthritis right shoulder and is planning to have reverse shoulder replacement by Dr. Alphonzo Severance.  The patient underwent botulinum toxin injection left lower extremity approximately 6 weeks ago.  He has noted some improvement in toe curling but now notes that his big toe is pointing upward more often and sometimes this interferes with donning socks or shoes.  He had no postprocedure complications. Pain Inventory Average Pain 5 Pain Right Now 0 My pain is intermittent and stabbing  In the last 24 hours, has pain interfered with the following? General activity 8 Relation with others 8 Enjoyment of life 8 What TIME of day is your pain at its worst? varies Sleep (in general) Poor  Pain is worse with: walking, bending and some activites Pain improves with: medication Relief from Meds: 3  Family History  Problem Relation Age of Onset  . Hypertension Mother   . Dementia Mother   . Diabetes Mother   . Hypertension Father   . CAD Father   . Diabetes Father   . CAD Brother    Social History   Socioeconomic History  . Marital status: Legally Separated    Spouse name: Not on file  . Number of children: 2  . Years of education: Not on file  . Highest education level: Not on file  Occupational History  . Not on file  Tobacco Use  . Smoking status: Former Smoker    Types: Cigarettes    Quit date: 01/29/2019    Years since quitting: 1.8  . Smokeless tobacco: Never Used  . Tobacco comment: weekend smoker  Vaping Use  . Vaping Use: Never used  Substance and Sexual Activity  . Alcohol use: Not Currently    Comment: social  . Drug use: No  . Sexual activity: Not Currently  Other  Topics Concern  . Not on file  Social History Narrative      Caffeine- soda occass   Social Determinants of Health   Financial Resource Strain: Not on file  Food Insecurity: Not on file  Transportation Needs: Not on file  Physical Activity: Not on file  Stress: Not on file  Social Connections: Not on file   Past Surgical History:  Procedure Laterality Date  . APPLICATION OF CRANIAL NAVIGATION N/A 01/10/2019   Procedure: APPLICATION OF CRANIAL NAVIGATION;  Surgeon: Erline Levine, MD;  Location: [facility];  Service: Neurosurgery;  Laterality: N/A;  . CATARACT EXTRACTION Right   . CRANIOTOMY Right 11/06/2012   Procedure: CRANIOTOMY TUMOR EXCISION;  Surgeon: Erline Levine, MD;  Location: [facility];  Service: Neurosurgery;  Laterality: Right;  Right Parasagittal craniotomy for meningioma with Stealth  . CRANIOTOMY Right 01/10/2019   Procedure: Right Parasagittal Craniotomy for Tumor;  Surgeon: Erline Levine, MD;  Location: [facility];  Service: Neurosurgery;  Laterality: Right;  Right parasagittal craniotomy for tumor  . ESOPHAGOGASTRODUODENOSCOPY    . right knee arthroscopy    . SHOULDER ARTHROSCOPY Right 06/09/2020   Procedure: RIGHT SHOULDER ARTHROSCOPY AND DEBRIDEMENT;  Surgeon: Newt Minion, MD;  Location: [facility];  Service: Orthopedics;  Laterality: Right;   Past Surgical History:  Procedure Laterality Date  . APPLICATION OF CRANIAL NAVIGATION N/A  01/10/2019   Procedure: APPLICATION OF CRANIAL NAVIGATION;  Surgeon: Erline Levine, MD;  Location: [facility];  Service: Neurosurgery;  Laterality: N/A;  . CATARACT EXTRACTION Right   . CRANIOTOMY Right 11/06/2012   Procedure: CRANIOTOMY TUMOR EXCISION;  Surgeon: Erline Levine, MD;  Location: [facility];  Service: Neurosurgery;  Laterality: Right;  Right Parasagittal craniotomy for meningioma with Stealth  . CRANIOTOMY Right 01/10/2019   Procedure: Right Parasagittal Craniotomy for Tumor;  Surgeon: Erline Levine, MD;  Location: [facility];  Service: Neurosurgery;  Laterality: Right;  Right parasagittal craniotomy for tumor  . ESOPHAGOGASTRODUODENOSCOPY    . right knee arthroscopy    . SHOULDER ARTHROSCOPY Right 06/09/2020   Procedure: RIGHT SHOULDER ARTHROSCOPY AND DEBRIDEMENT;  Surgeon: Newt Minion, MD;  Location: [facility];  Service: Orthopedics;  Laterality: Right;   Past Medical History:  Diagnosis Date  . Anxiety   . Brain tumor ([place]) 02/20/2013   brain tumor removed in March 2014, Dr Donald Pore  . Diffuse idiopathic skeletal hyperostosis 06/05/2013  . Dizziness   . Enlarged prostate   . GERD (gastroesophageal reflux disease)   . Headache(784.0)    scattered  . High cholesterol   . Hypertension   . 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[place]) 03/07/2019  . Meningioma ([place])   . Meningioma, recurrent of brain ([place]) 01/31/2019  . Neuropathy 12/05/2019  . Rotator cuff tear    right  . Seizures ([place])    11/27/19 last sz 1 wk ago   BP 129/83   Pulse 78   Temp 98.7 F (37.1 C)   Ht 5\' 9"  (1.753 m)   Wt 194 lb (88 kg)   SpO2 96%   BMI 28.65 kg/m   Opioid Risk Score:   Fall Risk Score:  `1  Depression screen PHQ 2/9  Depression screen [facility] 2/9 10/22/2020 09/25/2020 09/08/2020 05/06/2020 04/15/2020 03/20/2020  Decreased Interest 0 0 0 0 0 2  Down, Depressed, Hopeless 0 0 0 0 2 2  PHQ - 2 Score 0 0 0 0 2 4  Altered sleeping - 0 0 - 3 3  Tired, decreased energy - 0 0 - 0 3  Change in appetite - 0 0 - 2 0  Feeling bad or failure about yourself  - 0 0 - 2 2  Trouble concentrating - 0 0 - 1 2  Moving slowly or fidgety/restless - 0 0 - 1 1  Suicidal thoughts - - 0 - 0 0  PHQ-9 Score - 0 0 - 11 15  Difficult doing work/chores - - - - Somewhat difficult -  Some recent data might be hidden      Review of Systems  Constitutional: Negative.   HENT: Negative.   Eyes: Negative.   Respiratory: Negative.   Cardiovascular: Negative.   Gastrointestinal: Negative.   Endocrine: Negative.    Genitourinary: Negative.   Musculoskeletal: Positive for gait problem.       Pain Right shoulder , left leg  Skin: Negative.   Allergic/Immunologic: Negative.   Hematological: Negative.   Psychiatric/Behavioral: Negative.        Anxiety       Objective:   Physical Exam Vitals and nursing note reviewed.  Constitutional:      Appearance: He is obese.  HENT:     Head: Normocephalic and atraumatic.  Eyes:     Extraocular Movements: Extraocular movements intact.     Pupils: Pupils are equal, round, and reactive to light.  Skin:  General: Skin is warm and dry.  Neurological:     Mental Status: He is alert and oriented to person, place, and time.     Comments: Hyperactive Babinski left lower extremity. Tone is MAS 2 in the foot inverters as well as flexors.  With standing there is minimal toe curling in the left second through fifth digits but mild extension of the great toe with prominence of the EHL. Ambulates short distances without assistive device mild foot inversion no steppage gait no toe drag but has reduced foot clearance  Psychiatric:        Mood and Affect: Mood normal.        Behavior: Behavior normal.           Assessment & Plan:  1.  Spastic monoplegia left lower extremity which has responded to botulinum toxin injection for performed on 10/22/2020.  He can use additional injection in the left lateral head of the gastroc and also add left EHL bringing total dosing to 300 units repeat in 6 weeks  Add EHL 50U Left Gastroc - med head 50U add 50U lat head Left FDL 75U Left Post tib 75U

## 2020-12-08 ENCOUNTER — Other Ambulatory Visit: Payer: Self-pay

## 2020-12-08 ENCOUNTER — Ambulatory Visit: Payer: Medicaid Other | Attending: Family Medicine | Admitting: Family Medicine

## 2020-12-08 ENCOUNTER — Encounter: Payer: Self-pay | Admitting: Family Medicine

## 2020-12-08 VITALS — BP 137/83 | HR 92 | Ht 69.0 in | Wt 194.6 lb

## 2020-12-08 DIAGNOSIS — Z713 Dietary counseling and surveillance: Secondary | ICD-10-CM | POA: Insufficient documentation

## 2020-12-08 DIAGNOSIS — Z79899 Other long term (current) drug therapy: Secondary | ICD-10-CM | POA: Insufficient documentation

## 2020-12-08 DIAGNOSIS — Z6828 Body mass index (BMI) 28.0-28.9, adult: Secondary | ICD-10-CM | POA: Diagnosis not present

## 2020-12-08 DIAGNOSIS — E663 Overweight: Secondary | ICD-10-CM | POA: Diagnosis not present

## 2020-12-08 DIAGNOSIS — C709 Malignant neoplasm of meninges, unspecified: Secondary | ICD-10-CM | POA: Diagnosis not present

## 2020-12-08 DIAGNOSIS — R9431 Abnormal electrocardiogram [ECG] [EKG]: Secondary | ICD-10-CM | POA: Insufficient documentation

## 2020-12-08 DIAGNOSIS — R7303 Prediabetes: Secondary | ICD-10-CM

## 2020-12-08 DIAGNOSIS — I10 Essential (primary) hypertension: Secondary | ICD-10-CM

## 2020-12-08 DIAGNOSIS — Z7984 Long term (current) use of oral hypoglycemic drugs: Secondary | ICD-10-CM | POA: Insufficient documentation

## 2020-12-08 DIAGNOSIS — R569 Unspecified convulsions: Secondary | ICD-10-CM | POA: Diagnosis not present

## 2020-12-08 DIAGNOSIS — Z833 Family history of diabetes mellitus: Secondary | ICD-10-CM | POA: Insufficient documentation

## 2020-12-08 DIAGNOSIS — Z8249 Family history of ischemic heart disease and other diseases of the circulatory system: Secondary | ICD-10-CM | POA: Insufficient documentation

## 2020-12-08 DIAGNOSIS — Z01818 Encounter for other preprocedural examination: Secondary | ICD-10-CM | POA: Diagnosis not present

## 2020-12-08 LAB — POCT GLYCOSYLATED HEMOGLOBIN (HGB A1C): HbA1c, POC (controlled diabetic range): 6.2 % (ref 0.0–7.0)

## 2020-12-08 NOTE — Progress Notes (Signed)
Surgery Clearance

## 2020-12-08 NOTE — Progress Notes (Signed)
Subjective:  Patient ID: Joshua [name], male    DOB: Sep 20, 1960  Age: 60 y.o. MRN: [id number]  CC: Pre-op Exam   HPI Joshua [name] is a60 year old right-handed male male with a history of meningioma status post resection in 2014 hospitalized at [facility] from 01/03/2019 through 01/14/2019 for recurrent meningioma status post resection(the last of which was in 12/2018), seizureshere for surgical clearance.  Interval History: He will going for R shoulder surgery on 12/15/20 by Dr. Marlou Sa. He can walk up a flight of stairs without getting dyspneic. He has no chest pains or pedal edema.  Ambulates with the aid of a cane. Postsurgery he would have assistance from his sister as both of them live together.  Compliant with his antihypertensive and his blood pressure is normal.  Also doing well on his statin.  He is also followed closely by oncology for malignant meningioma of the brain with his last visit on 12/01/2020 and he is doing well on his antiseizure medications. He has no additional concerns today. Past Medical History:  Diagnosis Date  . Anxiety   . Brain tumor ([place]) 02/20/2013   brain tumor removed in March 2014, Dr Donald Pore  . Diffuse idiopathic skeletal hyperostosis 06/05/2013  . Dizziness   . Enlarged prostate   . GERD (gastroesophageal reflux disease)   . Headache(784.0)    scattered  . High cholesterol   . Hypertension   . 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[place]) 03/07/2019  . Meningioma ([place])   . Meningioma, recurrent of brain ([place]) 01/31/2019  . Neuropathy 12/05/2019  . Rotator cuff tear    right  . Seizures ([place])    11/27/19 last sz 1 wk ago    Past Surgical History:  Procedure Laterality Date  . APPLICATION OF CRANIAL NAVIGATION N/A 01/10/2019   Procedure: APPLICATION OF CRANIAL NAVIGATION;  Surgeon: Erline Levine, MD;  Location: [place];  Service: Neurosurgery;  Laterality: N/A;  . CATARACT EXTRACTION Right   . CRANIOTOMY Right 11/06/2012    Procedure: CRANIOTOMY TUMOR EXCISION;  Surgeon: Erline Levine, MD;  Location: [place] [facility];  Service: Neurosurgery;  Laterality: Right;  Right Parasagittal craniotomy for meningioma with Stealth  . CRANIOTOMY Right 01/10/2019   Procedure: Right Parasagittal Craniotomy for Tumor;  Surgeon: Erline Levine, MD;  Location: [place];  Service: Neurosurgery;  Laterality: Right;  Right parasagittal craniotomy for tumor  . ESOPHAGOGASTRODUODENOSCOPY    . right knee arthroscopy    . SHOULDER ARTHROSCOPY Right 06/09/2020   Procedure: RIGHT SHOULDER ARTHROSCOPY AND DEBRIDEMENT;  Surgeon: Newt Minion, MD;  Location: [place];  Service: Orthopedics;  Laterality: Right;    Family History  Problem Relation Age of Onset  . Hypertension Mother   . Dementia Mother   . Diabetes Mother   . Hypertension Father   . CAD Father   . Diabetes Father   . CAD Brother     No Known Allergies  Outpatient Medications Prior to Visit  Medication Sig Dispense Refill  . Accu-Chek Softclix Lancets lancets Use as instructed 100 each 12  . albuterol (VENTOLIN HFA) 108 (90 Base) MCG/ACT inhaler Inhale 2 puffs into the lungs every 6 (six) hours as needed for wheezing or shortness of breath. 8 g 2  . amLODipine (NORVASC) 5 MG tablet Take 1 tablet (5 mg total) by mouth daily. To lower blood pressure 30 tablet 4  . aspirin 81 MG chewable tablet     . atorvastatin (LIPITOR) 20 MG tablet TAKE  1 TABLET BY MOUTH EVERY DAY 90 tablet 1  . glucose blood (ACCU-CHEK AVIVA PLUS) test strip Use as instructed 100 each 12  . Lacosamide (VIMPAT) 100 MG TABS TAKE 2 TABLETS IN MORNING AND 2 AT BEDTIME 60 tablet 3  . lamoTRIgine (LAMICTAL) 100 MG tablet TAKE 2 TABLETS BY MOUTH TWICE A DAY 120 tablet 0  . metFORMIN (GLUCOPHAGE) 500 MG tablet Take 1 tablet by mouth 2 (two) times daily.    . Misc. Devices MISC Please provide BP cuff for patient 1 each 0  . ondansetron (ZOFRAN) 4 MG tablet Take 1 tablet (4 mg total) by mouth every 8  (eight) hours as needed for nausea or vomiting. 20 tablet 0  . pantoprazole (PROTONIX) 20 MG tablet Take 1 tablet (20 mg total) by mouth 2 (two) times daily before a meal. 60 tablet 3  . tamsulosin (FLOMAX) 0.4 MG CAPS capsule Take 1 capsule (0.4 mg total) by mouth daily. 90 capsule 3  . traMADol (ULTRAM) 50 MG tablet Take 1 tablet (50 mg total) by mouth every 12 (twelve) hours as needed. 30 tablet 0  . benzonatate (TESSALON) 100 MG capsule Take 1 capsule (100 mg total) by mouth 2 (two) times daily as needed for cough. (Patient not taking: Reported on 12/08/2020) 20 capsule 0  . clonazePAM (KLONOPIN) 0.5 MG tablet Take 1 tablet (0.5 mg total) by mouth 2 (two) times daily for 14 days. 28 tablet 0   No facility-administered medications prior to visit.     ROS Review of Systems  Constitutional: Negative for activity change and appetite change.  HENT: Negative for sinus pressure and sore throat.   Eyes: Negative for visual disturbance.  Respiratory: Negative for cough, chest tightness and shortness of breath.   Cardiovascular: Negative for chest pain and leg swelling.  Gastrointestinal: Negative for abdominal distention, abdominal pain, constipation and diarrhea.  Endocrine: Negative.   Genitourinary: Negative for dysuria.  Musculoskeletal: Positive for gait problem. Negative for joint swelling and myalgias.       See HPI  Skin: Negative for rash.  Allergic/Immunologic: Negative.   Neurological: Negative for weakness, light-headedness and numbness.  Psychiatric/Behavioral: Negative for dysphoric mood and suicidal ideas.    Objective:  BP 137/83   Pulse 92   Ht 5\' 9"  (1.753 m)   Wt 194 lb 9.6 oz (88.3 kg)   SpO2 97%   BMI 28.74 kg/m   BP/Weight 12/08/2020 12/03/2020 09/30/4006  Systolic BP 676 195 093  Diastolic BP 83 83 82  Wt. (Lbs) 194.6 194 192  BMI 28.74 28.65 28.35  Some encounter information is confidential and restricted. Go to Review Flowsheets activity to see all data.       Physical Exam Constitutional:      Appearance: He is well-developed.  Neck:     Vascular: No JVD.  Cardiovascular:     Rate and Rhythm: Normal rate.     Heart sounds: Normal heart sounds. No murmur heard.   Pulmonary:     Effort: Pulmonary effort is normal.     Breath sounds: Normal breath sounds. No wheezing or rales.  Chest:     Chest wall: No tenderness.  Abdominal:     General: Bowel sounds are normal. There is no distension.     Palpations: Abdomen is soft. There is no mass.     Tenderness: There is no abdominal tenderness.  Musculoskeletal:        General: Normal range of motion.     Right lower  leg: No edema.     Left lower leg: No edema.  Neurological:     Mental Status: He is alert and oriented to person, place, and time.     Gait: Gait abnormal.  Psychiatric:        Mood and Affect: Mood normal.     CMP Latest Ref Rng & Units 11/21/2020 11/18/2020 10/16/2020  Glucose 70 - 99 mg/dL 133(H) 112(H) 125(H)  BUN 6 - 20 mg/dL 12 13 14   Creatinine 0.61 - 1.24 mg/dL 0.89 1.00 0.77  Sodium 135 - 145 mmol/L 140 141 140  Potassium 3.5 - 5.1 mmol/L 3.7 4.5 3.9  Chloride 98 - 111 mmol/L 108 107 108  CO2 22 - 32 mmol/L 26 27 23   Calcium 8.9 - 10.3 mg/dL 9.1 9.4 9.2  Total Protein 6.5 - 8.1 g/dL - - 7.4  Total Bilirubin 0.3 - 1.2 mg/dL - - 0.6  Alkaline Phos 38 - 126 U/L - - 78  AST 15 - 41 U/L - - 17  ALT 0 - 44 U/L - - 21    Lipid Panel  No results found for: CHOL, TRIG, HDL, CHOLHDL, VLDL, LDLCALC, LDLDIRECT  CBC    Component Value Date/Time   WBC 6.8 11/21/2020 1914   RBC 4.76 11/21/2020 1914   HGB 14.4 11/21/2020 1914   HCT 42.2 11/21/2020 1914   PLT 274 11/21/2020 1914   MCV 88.7 11/21/2020 1914   MCH 30.3 11/21/2020 1914   MCHC 34.1 11/21/2020 1914   RDW 13.0 11/21/2020 1914   LYMPHSABS 1.8 10/16/2020 2041   MONOABS 1.2 (H) 10/16/2020 2041   EOSABS 0.1 10/16/2020 2041   BASOSABS 0.1 10/16/2020 2041    Lab Results  Component Value Date    HGBA1C 6.2 12/08/2020    Assessment & Plan:  1. Prediabetes Diet controlled with A1c of 6.2 Counseled on lifestyle modification to prevent progression to diabetes mellitus - POCT glycosylated hemoglobin (Hb A1C)  2. Essential hypertension Controlled Continue amlodipine Counseled on blood pressure goal of less than 130/80, low-sodium, DASH diet, medication compliance, 150 minutes of moderate intensity exercise per week. Discussed medication compliance, adverse effects.  3. Preoperative clearance EKG reveals incomplete right bundle branch block, nonspecific ST abnormality, unchanged from previous EKG He medically optimized for right shoulder arthroscopy with low risk of MACE Continue statin  4. Overweight (BMI 25.0-29.9) Counseled on reducing portion sizes, increasing physical activity and avoiding late meals.    No orders of the defined types were placed in this encounter.   Follow-up: Return in about 3 months (around 03/10/2021) for Chronic disease management.       Charlott Rakes, MD, FAAFP. [facility], [place]   12/08/2020, 11:07 AM

## 2020-12-09 ENCOUNTER — Telehealth: Payer: Self-pay

## 2020-12-09 DIAGNOSIS — C700 Malignant neoplasm of cerebral meninges: Secondary | ICD-10-CM

## 2020-12-09 MED ORDER — CLONAZEPAM 0.5 MG PO TABS: 0.5000 mg | ORAL_TABLET | Freq: Two times a day (BID) | ORAL | 1 refills | Status: DC

## 2020-12-09 NOTE — Addendum Note (Signed)
Addended by: [name] on: 12/09/2020 04:13 PM   Modules accepted: Orders

## 2020-12-09 NOTE — Telephone Encounter (Signed)
Pt called requesting a refill of Klonopin. Pt states he was advised to continue taking it BID.  I reviewed last OV note which does indicate pt to continue this BID.  Pharmacy has been confirmed.

## 2020-12-15 ENCOUNTER — Telehealth: Payer: Self-pay | Admitting: Orthopedic Surgery

## 2020-12-15 ENCOUNTER — Telehealth: Payer: Self-pay | Admitting: Family Medicine

## 2020-12-15 NOTE — Telephone Encounter (Signed)
Pt stated that he was suppose to have surgery and Dr. Marlou Sa with [facility] was suppose to contact Dr. Margarita Rana to follow up about pts shoulder surgery /Did Dr. Margarita Rana get any feed back/Pt states they are moving slow/ please advise asap

## 2020-12-15 NOTE — Telephone Encounter (Signed)
Please advise 

## 2020-12-15 NOTE — Telephone Encounter (Signed)
Patient called asked for a call back. Patient said he reached out to [facility] and medicaid have not heard anything from anyone concerning his surgery. Patient asked when will the information be sent into medicaid to get his surgery approved.   The number to contact patient is [phone]

## 2020-12-16 ENCOUNTER — Other Ambulatory Visit: Payer: Self-pay

## 2020-12-16 ENCOUNTER — Emergency Department: Payer: Medicaid Other

## 2020-12-16 ENCOUNTER — Encounter: Payer: Self-pay | Admitting: Emergency Medicine

## 2020-12-16 ENCOUNTER — Emergency Department
Admission: EM | Admit: 2020-12-16 | Discharge: 2020-12-16 | Disposition: A | Discharge status: Home / Self Care | Payer: Medicaid Other | Attending: Emergency Medicine | Admitting: Emergency Medicine

## 2020-12-16 DIAGNOSIS — Z79899 Other long term (current) drug therapy: Secondary | ICD-10-CM | POA: Diagnosis not present

## 2020-12-16 DIAGNOSIS — I10 Essential (primary) hypertension: Secondary | ICD-10-CM | POA: Insufficient documentation

## 2020-12-16 DIAGNOSIS — Z87891 Personal history of nicotine dependence: Secondary | ICD-10-CM | POA: Diagnosis not present

## 2020-12-16 DIAGNOSIS — H5713 Ocular pain, bilateral: Secondary | ICD-10-CM | POA: Diagnosis not present

## 2020-12-16 DIAGNOSIS — Z86011 Personal history of benign neoplasm of the brain: Secondary | ICD-10-CM | POA: Insufficient documentation

## 2020-12-16 DIAGNOSIS — Z7982 Long term (current) use of aspirin: Secondary | ICD-10-CM | POA: Diagnosis not present

## 2020-12-16 DIAGNOSIS — H538 Other visual disturbances: Secondary | ICD-10-CM | POA: Diagnosis not present

## 2020-12-16 DIAGNOSIS — Z9889 Other specified postprocedural states: Secondary | ICD-10-CM | POA: Diagnosis not present

## 2020-12-16 LAB — CBG MONITORING, ED: Glucose-Capillary: 168 mg/dL — ABNORMAL HIGH (ref 70–99)

## 2020-12-16 NOTE — Discharge Instructions (Addendum)
Follow-up with your regular doctor if not improving to 3 days.  Return emergency department worsening.  Take your medications as prescribed. Your CT did not show any tumor.

## 2020-12-16 NOTE — ED Provider Notes (Signed)
[facility] Emergency Department Provider Note  ____________________________________________   Event Date/Time   First MD Initiated Contact with Patient 12/16/20 1110     (approximate)  I have reviewed the triage vital signs and the nursing notes.   HISTORY  Chief Complaint Eye Pain and Blurred Vision    HPI Joshua [name] is a 60 y.o. male presents emergency department with concerns of blurred vision intermittently.  Patient states that he has had some pressure behind his eyes.  History of meningioma with surgical removal.  States he just wants to make sure he does not have a another tumor at this time.  He denies any headaches, chest pain, shortness of breath, vomiting, diarrhea.  No recent seizure.    Past Medical History:  Diagnosis Date  . Anxiety   . Brain tumor ([place]) 02/20/2013   brain tumor removed in March 2014, Dr Donald Pore  . Diffuse idiopathic skeletal hyperostosis 06/05/2013  . Dizziness   . Enlarged prostate   . GERD (gastroesophageal reflux disease)   . Headache(784.0)    scattered  . High cholesterol   . Hypertension   . 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 Meningioma ([place])   . Meningioma, recurrent of brain ([place]) 01/31/2019  . Neuropathy 12/05/2019  . Rotator cuff tear    right  . Seizures ([place])    11/27/19 last sz 1 wk ago    Patient Active Problem List   Diagnosis Date Noted  . Essential hypertension 07/02/2020  . Prediabetes 07/02/2020  . Polyuria 07/02/2020  . Nontraumatic complete tear of right rotator cuff   . Impingement syndrome of right shoulder   . Neuropathy 12/05/2019  . 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 Seizures ([place]) 01/31/2019  . Meningioma, recurrent of brain ([place]) 01/31/2019  . Meningioma ([place]) 01/03/2019  . Diffuse idiopathic skeletal hyperostosis 06/05/2013    Past Surgical History:  Procedure Laterality Date  . APPLICATION OF CRANIAL NAVIGATION  N/A 01/10/2019   Procedure: APPLICATION OF CRANIAL NAVIGATION;  Surgeon: Erline Levine, MD;  Location: [place];  Service: Neurosurgery;  Laterality: N/A;  . CATARACT EXTRACTION Right   . CRANIOTOMY Right 11/06/2012   Procedure: CRANIOTOMY TUMOR EXCISION;  Surgeon: Erline Levine, MD;  Location: [place] NEURO ORS;  Service: Neurosurgery;  Laterality: Right;  Right Parasagittal craniotomy for meningioma with [place]  . CRANIOTOMY Right 01/10/2019   Procedure: Right Parasagittal Craniotomy for Tumor;  Surgeon: Erline Levine, MD;  Location: [place];  Service: Neurosurgery;  Laterality: Right;  Right parasagittal craniotomy for tumor  . ESOPHAGOGASTRODUODENOSCOPY    . right knee arthroscopy    . SHOULDER ARTHROSCOPY Right 06/09/2020   Procedure: RIGHT SHOULDER ARTHROSCOPY AND DEBRIDEMENT;  Surgeon: Newt Minion, MD;  Location: [place];  Service: Orthopedics;  Laterality: Right;    Prior to Admission medications   Medication Sig Start Date End Date Taking? Authorizing Provider  Accu-Chek Softclix Lancets lancets Use as instructed 07/08/20   Mayers, Cari S, PA-C  albuterol (VENTOLIN HFA) 108 (90 Base) MCG/ACT inhaler Inhale 2 puffs into the lungs every 6 (six) hours as needed for wheezing or shortness of breath. 09/06/20   Merlyn Lot, MD  amLODipine (NORVASC) 5 MG tablet Take 1 tablet (5 mg total) by mouth daily. To lower blood pressure 10/05/20   Charlott Rakes, MD  aspirin 81 MG chewable tablet     [provider]  atorvastatin (LIPITOR) 20 MG tablet TAKE 1 TABLET BY  MOUTH EVERY DAY 09/25/20   Charlott Rakes, MD  benzonatate (TESSALON) 100 MG capsule Take 1 capsule (100 mg total) by mouth 2 (two) times daily as needed for cough. Patient not taking: Reported on 12/08/2020 09/10/20   Charlott Rakes, MD  clonazePAM (KLONOPIN) 0.5 MG tablet Take 1 tablet (0.5 mg total) by mouth 2 (two) times daily. 12/09/20   [name], MD  glucose blood (ACCU-CHEK AVIVA PLUS) test strip Use  as instructed 07/08/20   Mayers, Cari S, PA-C  Lacosamide (VIMPAT) 100 MG TABS TAKE 2 TABLETS IN MORNING AND 2 AT BEDTIME 12/01/20   Vaslow, Acey Lav, MD  lamoTRIgine (LAMICTAL) 100 MG tablet TAKE 2 TABLETS BY MOUTH TWICE A DAY 11/13/20   Vaslow, Acey Lav, MD  metFORMIN (GLUCOPHAGE) 500 MG tablet Take 1 tablet by mouth 2 (two) times daily. 11/12/20   [provider]  [place]. Devices MISC Please provide BP cuff for patient 07/08/20   Argentina Donovan, PA-C  ondansetron (ZOFRAN) 4 MG tablet Take 1 tablet (4 mg total) by mouth every 8 (eight) hours as needed for nausea or vomiting. 07/31/20   Swords, Darrick Penna, MD  pantoprazole (PROTONIX) 20 MG tablet Take 1 tablet (20 mg total) by mouth 2 (two) times daily before a meal. 1/70/01 7/49/44  Jodi Marble, MD  tamsulosin (FLOMAX) 0.4 MG CAPS capsule Take 1 capsule (0.4 mg total) by mouth daily. 07/23/20   Billey Co, MD  traMADol (ULTRAM) 50 MG tablet Take 1 tablet (50 mg total) by mouth every 12 (twelve) hours as needed. 10/12/20 10/12/21  Magnant, Gerrianne Scale, PA-C    Allergies Patient has no known allergies.  Family History  Problem Relation Age of Onset  . Hypertension Mother   . Dementia Mother   . Diabetes Mother   . Hypertension Father   . CAD Father   . Diabetes Father   . CAD Brother     Social History Social History   Tobacco Use  . Smoking status: Former Smoker    Types: Cigarettes    Quit date: 01/29/2019    Years since quitting: 1.8  . Smokeless tobacco: Never Used  . Tobacco comment: weekend smoker  Vaping Use  . Vaping Use: Never used  Substance Use Topics  . Alcohol use: Not Currently    Comment: social  . Drug use: No    Review of Systems  Constitutional: No fever/chills Eyes: Positive visual changes. ENT: No sore throat. Respiratory: Denies cough Cardiovascular: Denies chest pain Gastrointestinal: Denies abdominal pain Genitourinary: Negative for dysuria. Musculoskeletal: Negative for back  pain. Skin: Negative for rash. Psychiatric: no mood changes,     ____________________________________________   PHYSICAL EXAM:  VITAL SIGNS: ED Triage Vitals  Enc Vitals Group     BP 12/16/20 0919 (!) 142/91     Pulse Rate 12/16/20 0919 85     Resp 12/16/20 0919 16     Temp 12/16/20 0919 98.1 F (36.7 C)     Temp Source 12/16/20 0919 Oral     SpO2 12/16/20 0919 95 %     Weight 12/16/20 0919 185 lb (83.9 kg)     Height 12/16/20 0919 5\' 8"  (1.727 m)     Head Circumference --      Peak Flow --      Pain Score 12/16/20 0919 8     Pain Loc --      Pain Edu? --      Excl. in [place]? --  Constitutional: Alert and oriented. Well appearing and in no acute distress. Eyes: Conjunctivae are normal.  PERRL, EOMI Head: Atraumatic. Nose: No congestion/rhinnorhea. Mouth/Throat: Mucous membranes are moist.   Neck:  supple no lymphadenopathy noted Cardiovascular: Normal rate, regular rhythm. Heart sounds are normal Respiratory: Normal respiratory effort.  No retractions, lungs c t a  Abd: soft nontender bs normal all 4 quad GU: deferred Musculoskeletal: FROM all extremities, warm and well perfused Neurologic:  Normal speech and language.  Skin:  Skin is warm, dry and intact. No rash noted. Psychiatric: Mood and affect are normal. Speech and behavior are normal.  ____________________________________________   LABS (all labs ordered are listed, but only abnormal results are displayed)  Labs Reviewed  CBG MONITORING, ED - Abnormal; Notable for the following components:      Result Value   Glucose-Capillary 168 (*)    All other components within normal limits  CBG MONITORING, ED   ____________________________________________   ____________________________________________  RADIOLOGY  CT of the head  ____________________________________________   PROCEDURES  Procedure(s) performed: No  Procedures    ____________________________________________   INITIAL  IMPRESSION / ASSESSMENT AND PLAN / ED COURSE  Pertinent labs & imaging results that were available during my care of the patient were reviewed by me and considered in my medical decision making (see chart for details).   The patient is 60 year old male presents with concerns of blurred vision.  Patient is afraid he has another tumor would like to have CT scan.  See HPI.  Physical exam shows patient appear well and stable.  CT of the head reviewed by me confirmed by radiology to have no new tumor.  Patient does have surgical repair as noted  Did discuss findings with patient.  Offered to do lab work.  Patient states he just wanted the CT of the head.  He states he has additional imaging in September with his regular doctor.  States he has an upcoming surgery and was just concerned if he had a tumor.  He agrees to his treatment plan.  Is discharged stable condition.     Joshua [name] was evaluated in Emergency Department on 12/16/2020 for the symptoms described in the history of present illness. He was evaluated in the context of the global COVID-19 pandemic, which necessitated consideration that the patient might be at risk for infection with the SARS-CoV-2 virus that causes COVID-19. Institutional protocols and algorithms that pertain to the evaluation of patients at risk for COVID-19 are in a state of rapid change based on information released by regulatory bodies including the CDC and federal and state organizations. These policies and algorithms were followed during the patient's care in the ED.    As part of my medical decision making, I reviewed the following data within the [place] notes reviewed and incorporated, Old chart reviewed, Radiograph reviewed , Notes from prior ED visits and [place] Controlled Substance Database  ____________________________________________   FINAL CLINICAL IMPRESSION(S) / ED DIAGNOSES  Final diagnoses:  Blurred vision, bilateral       NEW MEDICATIONS STARTED DURING THIS VISIT:  New Prescriptions   No medications on file     Note:  This document was prepared using Dragon voice recognition software and may include unintentional dictation errors.    Versie Starks, PA-C 12/16/20 1126    Duffy Bruce, MD 12/23/20 1032

## 2020-12-16 NOTE — ED Triage Notes (Signed)
Pt comes into the ED via POV c/o bilateral eye pain and blurred vision.  Pt states he came in because he has had this before and its when they found a tumor on his brain.  Pt states he is pre-diabetic but his CBG has been running at 100.  Pt in NAD at this time with even and unlabored respirations.  Pt is neurologically intact at this time.  PT states the pain and blurred vision started last night.

## 2020-12-28 ENCOUNTER — Other Ambulatory Visit: Payer: Self-pay | Admitting: Internal Medicine

## 2020-12-28 DIAGNOSIS — C700 Malignant neoplasm of cerebral meninges: Secondary | ICD-10-CM

## 2020-12-28 DIAGNOSIS — R569 Unspecified convulsions: Secondary | ICD-10-CM

## 2021-01-04 NOTE — Pre-Procedure Instructions (Signed)
Surgical Instructions    Your procedure is scheduled on Friday, June 24th.  Report to [facility] Main Entrance "A" at 5:30 A.M., then check in with the Admitting office.  Call this number if you have problems the morning of surgery:  [phone]   If you have any questions prior to your surgery date call [phone]: Open Monday-Friday 8am-4pm    Remember:  Do not eat after midnight the night before your surgery  You may drink clear liquids until 4:30 the morning of your surgery.   Clear liquids allowed are: Water, Non-Citrus Juices (without pulp), Carbonated Beverages, Clear Tea, Black Coffee Only, and Gatorade. (Please chose diet or sugar-free options).   Enhanced Recovery after Surgery for Orthopedics Enhanced Recovery after Surgery is a protocol used to improve the stress on your body and your recovery after surgery.  Patient Instructions  The day of surgery (if you have diabetes):  Drink ONE small 10 oz bottle of water by 4:30 am the morning of surgery This bottle was given to you during your hospital  pre-op appointment visit.  Nothing else to drink after completing the  Small 10 oz bottle of water.         If you have questions, please contact your surgeon's office.     Take these medicines the morning of surgery with A SIP OF WATER  amLODipine (NORVASC)  atorvastatin (LIPITOR)  Lacosamide (VIMPAT)  lamoTRIgine (LAMICTAL)  pantoprazole (PROTONIX)  tamsulosin (FLOMAX)    Take these medication AS NEEDED: Albuterol inhaler-please bring with you on the day of surgery. acetaminophen (TYLENOL)  clonazePAM (KLONOPIN)  Follow your surgeon's instructions on when to stop Aspirin.  If no instructions were given by your surgeon then you will need to call the office to get those instructions.    As of today, STOP taking any Aleve, Naproxen, Ibuprofen, Motrin, Advil, Goody's, BC's, all herbal medications, fish oil, and all vitamins.          WHAT DO I DO ABOUT MY  DIABETES MEDICATION?  Do not take metFORMIN (GLUCOPHAGE) the morning of surgery.   HOW TO MANAGE YOUR DIABETES BEFORE AND AFTER SURGERY  Why is it important to control my blood sugar before and after surgery? Improving blood sugar levels before and after surgery helps healing and can limit problems. A way of improving blood sugar control is eating a healthy diet by:  Eating less sugar and carbohydrates  Increasing activity/exercise  Talking with your doctor about reaching your blood sugar goals High blood sugars (greater than 180 mg/dL) can raise your risk of infections and slow your recovery, so you will need to focus on controlling your diabetes during the weeks before surgery. Make sure that the doctor who takes care of your diabetes knows about your planned surgery including the date and location.  How do I manage my blood sugar before surgery? Check your blood sugar at least 4 times a day, starting 2 days before surgery, to make sure that the level is not too high or low.  Check your blood sugar the morning of your surgery when you wake up and every 2 hours until you get to the Short Stay unit.  If your blood sugar is less than 70 mg/dL, you will need to treat for low blood sugar: Do not take insulin. Treat a low blood sugar (less than 70 mg/dL) with  cup of clear juice (cranberry or apple), 4 glucose tablets, OR glucose gel. Recheck blood sugar in 15 minutes after treatment (  to make sure it is greater than 70 mg/dL). If your blood sugar is not greater than 70 mg/dL on recheck, call [phone] for further instructions. Report your blood sugar to the short stay nurse when you get to Short Stay.  If you are admitted to the hospital after surgery: Your blood sugar will be checked by the staff and you will probably be given insulin after surgery (instead of oral diabetes medicines) to make sure you have good blood sugar levels. The goal for blood sugar control after surgery is  80-180 mg/dL.             Do NOT Smoke (Tobacco/Vaping) or drink Alcohol 24 hours prior to your procedure.  If you use a CPAP at night, you may bring all equipment for your overnight stay.   Contacts, glasses, piercing's, hearing aid's, dentures or partials may not be worn into surgery, please bring cases for these belongings.    For patients admitted to the hospital, discharge time will be determined by your treatment team.   Patients discharged the day of surgery will not be allowed to drive home, and someone needs to stay with them for 24 hours.    Special instructions:                                    [place]- Preparing for Total Shoulder Arthroplasty   Before surgery, you can play an important role. Because skin is not sterile, your skin needs to be as free of germs as possible. You can reduce the number of germs on your skin by using the following products. Benzoyl Peroxide Gel Reduces the number of germs present on the skin Applied twice a day to shoulder area starting two days before surgery   Chlorhexidine Gluconate (CHG) Soap An antiseptic cleaner that kills germs and bonds with the skin to continue killing germs even after washing Used for showering the night before surgery and morning of surgery   Oral Hygiene is also important to reduce your risk of infection.                                    Remember - BRUSH YOUR TEETH THE MORNING OF SURGERY WITH YOUR REGULAR TOOTHPASTE  ==================================================================  Please follow these instructions carefully:  BENZOYL PEROXIDE 5% GEL  Please do not use if you have an allergy to benzoyl peroxide.   If your skin becomes reddened/irritated stop using the benzoyl peroxide.  Starting two days before surgery, apply as follows: Apply benzoyl peroxide in the morning and at night. Apply after taking a shower. If you are not taking a shower clean entire shoulder front, back, and side along with  the armpit with a clean wet washcloth.  Place a quarter-sized dollop on your shoulder and rub in thoroughly, making sure to cover the front, back, and side of your shoulder, along with the armpit.   2 days before ____ AM   ____ PM              1 day before ____ AM   ____ PM                             Do this twice a day for two days.  (Last application is the night before surgery,  AFTER using the CHG soap as described below).  Do NOT apply benzoyl peroxide gel on the day of surgery.  CHLORHEXIDINE GLUCONATE (CHG) SOAP  Please do not use if you have an allergy to CHG or antibacterial soaps. If your skin becomes reddened/irritated stop using the CHG.   Do not shave (including legs and underarms) for at least 48 hours prior to first CHG shower. It is OK to shave your face.  Starting the night before surgery, use CHG soap as follows:  Shower the NIGHT BEFORE SURGERY and MORNING OF SURGERY with CHG.  If you choose to wash your hair, wash your hair first as usual with your normal shampoo.  After shampooing, rinse your hair and body thoroughly to remove the shampoo.  Use CHG as you would any other liquid soap.  You can apply CHG directly to the skin and wash gently with a scrungie or a clean washcloth.  Apply the CHG soap to your body ONLY FROM THE NECK DOWN.  Do not use on open wounds or open sores.  Avoid contact with your eyes, ears, mouth, and genitals (private parts).  Wash face and genitals (private parts) with your normal soap.  Wash thoroughly, paying special attention to the area where your surgery will be performed.  Thoroughly rinse your body with warm water from the neck down.  DO NOT shower/wash with your normal soap after using and rinsing off the CHG soap.   Pat yourself dry with a CLEAN TOWEL.    Apply benzoyl peroxide.   Wear CLEAN PAJAMAS to bed the night before surgery; wear comfortable clothes the morning of surgery.  Place CLEAN SHEETS on your bed the night  of your first shower and DO NOT SLEEP WITH PETS.    Day of Surgery: Shower with CHG soap. Do not wear jewelry. Do not wear lotions, powders, colognes, or deodorant. Men may shave face and neck. Do not bring valuables to the hospital. [facility] is not responsible for any belongings or valuables. Wear Clean/Comfortable clothing the morning of surgery Remember to brush your teeth WITH YOUR REGULAR TOOTHPASTE.   Please read over the following fact sheets that you were given.

## 2021-01-05 ENCOUNTER — Other Ambulatory Visit (HOSPITAL_COMMUNITY): Payer: Medicaid Other

## 2021-01-05 ENCOUNTER — Encounter (HOSPITAL_COMMUNITY)
Admission: RE | Admit: 2021-01-05 | Discharge: 2021-01-05 | Disposition: A | Discharge status: Home / Self Care | Payer: Medicaid Other | Source: Ambulatory Visit | Attending: Orthopedic Surgery | Admitting: Orthopedic Surgery

## 2021-01-05 ENCOUNTER — Encounter (HOSPITAL_COMMUNITY): Payer: Self-pay

## 2021-01-05 ENCOUNTER — Other Ambulatory Visit: Payer: Self-pay

## 2021-01-05 DIAGNOSIS — Z01812 Encounter for preprocedural laboratory examination: Secondary | ICD-10-CM | POA: Diagnosis present

## 2021-01-05 DIAGNOSIS — Z20822 Contact with and (suspected) exposure to covid-19: Secondary | ICD-10-CM | POA: Insufficient documentation

## 2021-01-05 HISTORY — DX: Prediabetes: R73.03

## 2021-01-05 LAB — URINALYSIS, ROUTINE W REFLEX MICROSCOPIC
Bilirubin Urine: NEGATIVE
Glucose, UA: NEGATIVE mg/dL
Hgb urine dipstick: NEGATIVE
Ketones, ur: NEGATIVE mg/dL
Leukocytes,Ua: NEGATIVE
Nitrite: NEGATIVE
Protein, ur: NEGATIVE mg/dL
Specific Gravity, Urine: 1.021 (ref 1.005–1.030)
pH: 5 (ref 5.0–8.0)

## 2021-01-05 LAB — CBC
HCT: 48 % (ref 39.0–52.0)
Hemoglobin: 15.6 g/dL (ref 13.0–17.0)
MCH: 29.9 pg (ref 26.0–34.0)
MCHC: 32.5 g/dL (ref 30.0–36.0)
MCV: 92 fL (ref 80.0–100.0)
Platelets: 260 10*3/uL (ref 150–400)
RBC: 5.22 MIL/uL (ref 4.22–5.81)
RDW: 13.3 % (ref 11.5–15.5)
WBC: 5.4 10*3/uL (ref 4.0–10.5)
nRBC: 0 % (ref 0.0–0.2)

## 2021-01-05 LAB — SARS CORONAVIRUS 2 (TAT 6-24 HRS): SARS Coronavirus 2: NEGATIVE

## 2021-01-05 LAB — BASIC METABOLIC PANEL
Anion gap: 9 (ref 5–15)
BUN: 14 mg/dL (ref 6–20)
CO2: 25 mmol/L (ref 22–32)
Calcium: 9.5 mg/dL (ref 8.9–10.3)
Chloride: 106 mmol/L (ref 98–111)
Creatinine, Ser: 0.84 mg/dL (ref 0.61–1.24)
GFR, Estimated: >60 mL/min (ref >60)
Glucose, Bld: 115 mg/dL — ABNORMAL HIGH (ref 70–99)
Potassium: 4.1 mmol/L (ref 3.5–5.1)
Sodium: 140 mmol/L (ref 135–145)

## 2021-01-05 LAB — SURGICAL PCR SCREEN
MRSA, PCR: NEGATIVE
Staphylococcus aureus: NEGATIVE

## 2021-01-05 LAB — GLUCOSE, CAPILLARY: Glucose-Capillary: 110 mg/dL — ABNORMAL HIGH (ref 70–99)

## 2021-01-05 NOTE — Progress Notes (Signed)
PCP - Dr. Charlott Rakes Cardiologist - denies  Chest x-ray - 11/21/20 EKG - 12/08/20 Stress Test - 12/18/19 ECHO - denies Cardiac Cath - denies  Sleep Study - denies CPAP - denies  Fasting Blood Sugar - 110-120 Checks Blood Sugar 1 time a day CBG in PAT 110. Last A1C 12/08/20-6.2  Blood Thinner Instructions: n/a Aspirin Instructions: LD 01/04/21  ERAS Protcol - Clear liquids until 0430 DOS PRE-SURGERY Ensure or G2- pt given (1) 10oz bottle of water  Pt stated that he received the tube of benzoyl peroxide from [facility]. Not given in PAT.  COVID TEST- 01/05/21 in PAT  Anesthesia review: Yes, hx of seizures. Pt stated his last seizure was a mild one, 3 weeks ago. Pt stated that he takes his medications as prescribed every day.  Patient denies shortness of breath, fever, cough and chest pain at PAT appointment   All instructions explained to the patient, with a verbal understanding of the material. Patient agrees to go over the instructions while at home for a better understanding. Patient also instructed to self quarantine after being tested for COVID-19. The opportunity to ask questions was provided.

## 2021-01-06 LAB — URINE CULTURE: Culture: <10000 — AB

## 2021-01-06 NOTE — Progress Notes (Signed)
Anesthesia Chart Review:  History of meningioma status post resection in 2014 hospitalized at [facility] from 01/03/2019 through 01/14/2019 for recurrent meningioma status post resection (the last of which was in 12/2018), seizures.  Seen by [facility] Dr. Charlane Ferretti 12/08/20 for preop clearance. Per note, "He will going for R shoulder surgery on 12/15/20 by Dr. Marlou Sa. He can walk up a flight of stairs without getting dyspneic. He has no chest pains or pedal edema.  Ambulates with the aid of a cane. Postsurgery he would have assistance from his sister as both of them live together.Compliant with his antihypertensive and his blood pressure is normal.  Also doing well on his statin.  He is also followed closely by oncology for malignant meningioma of the brain with his last visit on 12/01/2020 and he is doing well on his antiseizure medications. He has no additional concerns today.Marland KitchenMarland KitchenMarland KitchenPreoperative clearance EKG reveals incomplete right bundle branch block, nonspecific ST abnormality, unchanged from previous EKG. He medically optimized for right shoulder arthroscopy with low risk of MACE."  Follows with Dr. Mickeal Skinner for hx of malignant meningioma of the brain s/p resection and seizures. Last seen 12/01/20, Stable at that time. Current medication regimen includes Vimpat 200mg  BID, Lamictal 200mg  BID, Klonopin 0.5mg  BID.  Preop labs reviewed, unremarkable.  EKG 12/08/2020: Normal sinus rhythm. Rate 79. Incomplete right bundle branch block. Nonspecific T wave abnormality. No significant change since last tracing  Cardiac telemetry 01/22/20: Sinus bradycardia to sinus tachycardia. No atrial fibrillation, no arrhythmias.   Normal 30 day event monitor with no evidence for atrial fibrillation.  Nuclear stress test 12/18/19: Nuclear stress EF: 60%. There was no ST segment deviation noted during stress. No T wave inversion was noted during stress. The study is normal. This is a low risk study. The left ventricular  ejection fraction is normal (55-65%).  Wynonia Musty [facility] Short Stay Center/Anesthesiology Phone [phone] 01/06/2021 10:58 AM

## 2021-01-06 NOTE — Anesthesia Preprocedure Evaluation (Addendum)
Anesthesia Evaluation  Patient identified by MRN, date of birth, ID band Patient awake    Reviewed: Allergy & Precautions, NPO status , Patient's Chart, lab work & pertinent test results  History of Anesthesia Complications Negative for: history of anesthetic complications  Airway Mallampati: II  TM Distance: >3 FB Neck ROM: Full    Dental  (+) Partial Upper, Missing, Dental Advisory Given   Pulmonary neg pulmonary ROS, former smoker,    Pulmonary exam normal        Cardiovascular hypertension, Pt. on medications Normal cardiovascular exam     Neuro/Psych Seizures -,  Anxiety Recurrent meningioma resected 2014 & 2020    GI/Hepatic Neg liver ROS, GERD  ,  Endo/Other  diabetes (pre-diabetes), Oral Hypoglycemic Agents  Renal/GU negative Renal ROS  negative genitourinary   Musculoskeletal negative musculoskeletal ROS (+)   Abdominal   Peds  Hematology negative hematology ROS (+)   Anesthesia Other Findings  Normal stress test 12/18/19  Reproductive/Obstetrics                          Anesthesia Physical Anesthesia Plan  ASA: 3  Anesthesia Plan: General   Post-op Pain Management: GA combined w/ Regional for post-op pain   Induction: Intravenous  PONV Risk Score and Plan: 2 and Ondansetron, Dexamethasone, Treatment may vary due to age or medical condition and Midazolam  Airway Management Planned: Oral ETT  Additional Equipment: None  Intra-op Plan:   Post-operative Plan: Extubation in OR  Informed Consent: I have reviewed the patients History and Physical, chart, labs and discussed the procedure including the risks, benefits and alternatives for the proposed anesthesia with the patient or authorized representative who has indicated his/her understanding and acceptance.     Dental advisory given  Plan Discussed with:   Anesthesia Plan Comments: (PAT note by Karoline Caldwell,  PA-C: History of meningioma status post resection in 2014 hospitalized at [facility] from 01/03/2019 through 01/14/2019 for recurrent meningioma status post resection(the last of which was in 12/2018), seizures.  Seen by [facility] Dr. Charlane Ferretti 12/08/20 for preop clearance. Per note, "He will going for R shoulder surgery on 5/31/22by Dr. Marlou Sa. He can walk up a flight of stairswithout getting dyspneic. He has no chest painsor pedal edema. Ambulates with the aid of a cane. Postsurgery he would have assistance from his sister as both of them live together.Compliant with his antihypertensive and his blood pressure is normal. Also doing well on his statin. He is also followed closely by oncology for malignant meningioma of the brain with his last visit on 12/01/2020 and he is doing well on his antiseizure medications. He has no additional concerns today.Marland KitchenMarland KitchenMarland KitchenPreoperative clearance EKG reveals incomplete right bundle branch block, nonspecific ST abnormality, unchanged from previous EKG. Hemedically optimized for right shoulder arthroscopy with low risk ofMACE."  Follows with Dr. Mickeal Skinner for hx of malignant meningioma of the brain s/p resection and seizures. Last seen 12/01/20, Stable at that time. Current medication regimen includes Vimpat 200mg  BID, Lamictal 200mg  BID, Klonopin 0.5mg  BID.  Preop labs reviewed, unremarkable.  EKG 12/08/2020: Normal sinus rhythm. Rate 79. Incomplete right bundle branch block. Nonspecific T wave abnormality. No significant change since last tracing  Cardiac telemetry 01/22/20: . Sinus bradycardia to sinus tachycardia. . No atrial fibrillation, no arrhythmias.  Normal 30 day event monitor with no evidence for atrial fibrillation.  Nuclear stress test 12/18/19: . Nuclear stress EF: 60%. . There was no ST segment deviation noted  during stress. Marland Kitchen No T wave inversion was noted during stress. . The study is normal. . This is a low risk study. . The left ventricular ejection  fraction is normal (55-65%). )      Anesthesia Quick Evaluation

## 2021-01-07 ENCOUNTER — Telehealth: Payer: Self-pay | Admitting: Family Medicine

## 2021-01-07 DIAGNOSIS — R7303 Prediabetes: Secondary | ICD-10-CM

## 2021-01-07 MED ORDER — ACCU-CHEK GUIDE ME W/DEVICE KIT: PACK | 0 refills | Status: DC

## 2021-01-07 MED ORDER — ACCU-CHEK GUIDE VI STRP: ORAL_STRIP | 6 refills | Status: DC

## 2021-01-07 NOTE — Telephone Encounter (Signed)
Rx sent 

## 2021-01-07 NOTE — Telephone Encounter (Signed)
Copied from [place] [id number]. Topic: General - Other >> Jan 07, 2021 11:07 AM Pawlus, Monica A wrote: Reason for CRM: Pt needs a new blood sugar meter, his is no longer working,  pt would like it sent to [id number] - [place], [place] - [address]. >> Jan 07, 2021 11:10 AM Pawlus, Brayton Layman A wrote: Please send in if possible - ACCU-CHEK AVIVA PLUS

## 2021-01-08 ENCOUNTER — Encounter (HOSPITAL_COMMUNITY)
Admission: RE | Disposition: A | Discharge status: Home / Self Care | Payer: Self-pay | Source: Home / Self Care | Attending: Orthopedic Surgery

## 2021-01-08 ENCOUNTER — Ambulatory Visit (HOSPITAL_COMMUNITY): Payer: Medicaid Other

## 2021-01-08 ENCOUNTER — Ambulatory Visit (HOSPITAL_COMMUNITY): Payer: Medicaid Other | Admitting: Vascular Surgery

## 2021-01-08 ENCOUNTER — Inpatient Hospital Stay (HOSPITAL_COMMUNITY)
Admission: RE | Admit: 2021-01-08 | Discharge: 2021-01-11 | DRG: 483 | Disposition: A | Discharge status: Home / Self Care | Payer: Medicaid Other | Attending: Orthopedic Surgery | Admitting: Orthopedic Surgery

## 2021-01-08 ENCOUNTER — Ambulatory Visit (HOSPITAL_COMMUNITY): Payer: Medicaid Other | Admitting: Anesthesiology

## 2021-01-08 ENCOUNTER — Encounter (HOSPITAL_COMMUNITY): Payer: Self-pay | Admitting: Orthopedic Surgery

## 2021-01-08 DIAGNOSIS — K08409 Partial loss of teeth, unspecified cause, unspecified class: Secondary | ICD-10-CM | POA: Diagnosis not present

## 2021-01-08 DIAGNOSIS — Z79899 Other long term (current) drug therapy: Secondary | ICD-10-CM | POA: Diagnosis not present

## 2021-01-08 DIAGNOSIS — E7800 Pure hypercholesterolemia, unspecified: Secondary | ICD-10-CM | POA: Diagnosis present

## 2021-01-08 DIAGNOSIS — E1140 Type 2 diabetes mellitus with diabetic neuropathy, unspecified: Secondary | ICD-10-CM | POA: Diagnosis present

## 2021-01-08 DIAGNOSIS — N400 Enlarged prostate without lower urinary tract symptoms: Secondary | ICD-10-CM | POA: Diagnosis not present

## 2021-01-08 DIAGNOSIS — I10 Essential (primary) hypertension: Secondary | ICD-10-CM | POA: Diagnosis present

## 2021-01-08 DIAGNOSIS — Z7982 Long term (current) use of aspirin: Secondary | ICD-10-CM

## 2021-01-08 DIAGNOSIS — K219 Gastro-esophageal reflux disease without esophagitis: Secondary | ICD-10-CM | POA: Diagnosis not present

## 2021-01-08 DIAGNOSIS — M75101 Unspecified rotator cuff tear or rupture of right shoulder, not specified as traumatic: Secondary | ICD-10-CM | POA: Diagnosis present

## 2021-01-08 DIAGNOSIS — Z96611 Presence of right artificial shoulder joint: Secondary | ICD-10-CM

## 2021-01-08 DIAGNOSIS — Z471 Aftercare following joint replacement surgery: Secondary | ICD-10-CM | POA: Diagnosis not present

## 2021-01-08 DIAGNOSIS — Z86011 Personal history of benign neoplasm of the brain: Secondary | ICD-10-CM

## 2021-01-08 DIAGNOSIS — Z8249 Family history of ischemic heart disease and other diseases of the circulatory system: Secondary | ICD-10-CM

## 2021-01-08 DIAGNOSIS — F419 Anxiety disorder, unspecified: Secondary | ICD-10-CM | POA: Diagnosis not present

## 2021-01-08 DIAGNOSIS — M12811 Other specific arthropathies, not elsewhere classified, right shoulder: Secondary | ICD-10-CM | POA: Diagnosis not present

## 2021-01-08 DIAGNOSIS — Z833 Family history of diabetes mellitus: Secondary | ICD-10-CM

## 2021-01-08 DIAGNOSIS — M19011 Primary osteoarthritis, right shoulder: Secondary | ICD-10-CM | POA: Diagnosis not present

## 2021-01-08 DIAGNOSIS — Z9889 Other specified postprocedural states: Secondary | ICD-10-CM

## 2021-01-08 DIAGNOSIS — Z87891 Personal history of nicotine dependence: Secondary | ICD-10-CM

## 2021-01-08 DIAGNOSIS — G8918 Other acute postprocedural pain: Secondary | ICD-10-CM | POA: Diagnosis not present

## 2021-01-08 HISTORY — PX: REVERSE SHOULDER ARTHROPLASTY: SHX5054

## 2021-01-08 LAB — GLUCOSE, CAPILLARY
Glucose-Capillary: 125 mg/dL — ABNORMAL HIGH (ref 70–99)
Glucose-Capillary: 114 mg/dL — ABNORMAL HIGH (ref 70–99)
Glucose-Capillary: 190 mg/dL — ABNORMAL HIGH (ref 70–99)
Glucose-Capillary: 118 mg/dL — ABNORMAL HIGH (ref 70–99)

## 2021-01-08 LAB — HEMOGLOBIN A1C
Hgb A1c MFr Bld: 6.3 % — ABNORMAL HIGH (ref 4.8–5.6)
Mean Plasma Glucose: 134.11 mg/dL

## 2021-01-08 SURGERY — ARTHROPLASTY, SHOULDER, TOTAL, REVERSE
Anesthesia: General | Site: Shoulder | Laterality: Right

## 2021-01-08 MED ORDER — POVIDONE-IODINE 10 % EX SWAB
2.0000 | Freq: Once | CUTANEOUS | Status: AC
Start: 2021-01-08 — End: 2021-01-08
  Administered 2021-01-08: 2 via TOPICAL

## 2021-01-08 MED ORDER — INSULIN ASPART 100 UNIT/ML IJ SOLN
0.0000 [IU] | Freq: Three times a day (TID) | INTRAMUSCULAR | Status: DC
  Administered 2021-01-08: 3 [IU] via SUBCUTANEOUS
  Administered 2021-01-09: 2 [IU] via SUBCUTANEOUS
  Administered 2021-01-11: 3 [IU] via SUBCUTANEOUS
  Administered 2021-01-11: 2 [IU] via SUBCUTANEOUS

## 2021-01-08 MED ORDER — LACTATED RINGERS IV SOLN: INTRAVENOUS | Status: DC

## 2021-01-08 MED ORDER — STERILE WATER FOR IRRIGATION IR SOLN
Status: DC | PRN
  Administered 2021-01-08: 1000 mL

## 2021-01-08 MED ORDER — DOCUSATE SODIUM 100 MG PO CAPS
100.0000 mg | ORAL_CAPSULE | Freq: Two times a day (BID) | ORAL | Status: DC
  Administered 2021-01-08 – 2021-01-11 (×7): 100 mg via ORAL
  Filled 2021-01-08 (×7): qty 1

## 2021-01-08 MED ORDER — LACOSAMIDE 200 MG PO TABS
200.0000 mg | ORAL_TABLET | Freq: Two times a day (BID) | ORAL | Status: DC
  Administered 2021-01-08 – 2021-01-11 (×6): 200 mg via ORAL
  Filled 2021-01-08 (×7): qty 1

## 2021-01-08 MED ORDER — MIDAZOLAM HCL 2 MG/2ML IJ SOLN
INTRAMUSCULAR | Status: AC
  Filled 2021-01-08: qty 2

## 2021-01-08 MED ORDER — PHENOL 1.4 % MT LIQD: 1.0000 | OROMUCOSAL | Status: DC | PRN

## 2021-01-08 MED ORDER — CHLORHEXIDINE GLUCONATE 0.12 % MT SOLN
OROMUCOSAL | Status: AC
  Administered 2021-01-08: 15 mL via OROMUCOSAL
  Filled 2021-01-08: qty 15

## 2021-01-08 MED ORDER — TRANEXAMIC ACID-NACL 1000-0.7 MG/100ML-% IV SOLN
INTRAVENOUS | Status: AC
  Filled 2021-01-08: qty 100

## 2021-01-08 MED ORDER — OXYCODONE HCL 5 MG PO TABS: 5.0000 mg | ORAL_TABLET | Freq: Once | ORAL | Status: DC | PRN

## 2021-01-08 MED ORDER — LAMOTRIGINE 100 MG PO TABS
200.0000 mg | ORAL_TABLET | Freq: Two times a day (BID) | ORAL | Status: DC
  Administered 2021-01-08 – 2021-01-11 (×6): 200 mg via ORAL
  Filled 2021-01-08 (×7): qty 2

## 2021-01-08 MED ORDER — ORAL CARE MOUTH RINSE: 15.0000 mL | Freq: Once | OROMUCOSAL | Status: AC

## 2021-01-08 MED ORDER — DEXAMETHASONE SODIUM PHOSPHATE 10 MG/ML IJ SOLN
INTRAMUSCULAR | Status: DC | PRN
  Administered 2021-01-08: 10 mg via INTRAVENOUS

## 2021-01-08 MED ORDER — HYDROMORPHONE HCL 1 MG/ML IJ SOLN
0.5000 mg | INTRAMUSCULAR | Status: DC | PRN
Start: 2021-01-08 — End: 2021-01-12

## 2021-01-08 MED ORDER — EPHEDRINE 5 MG/ML INJ
INTRAVENOUS | Status: AC
  Filled 2021-01-08: qty 10

## 2021-01-08 MED ORDER — POVIDONE-IODINE 7.5 % EX SOLN
Freq: Once | CUTANEOUS | Status: DC
  Filled 2021-01-08: qty 118

## 2021-01-08 MED ORDER — ONDANSETRON HCL 4 MG/2ML IJ SOLN
INTRAMUSCULAR | Status: DC | PRN
  Administered 2021-01-08: 4 mg via INTRAVENOUS

## 2021-01-08 MED ORDER — METHOCARBAMOL 1000 MG/10ML IJ SOLN
500.0000 mg | Freq: Four times a day (QID) | INTRAVENOUS | Status: DC | PRN
  Administered 2021-01-09: 500 mg via INTRAVENOUS
  Filled 2021-01-08: qty 500

## 2021-01-08 MED ORDER — SUGAMMADEX SODIUM 200 MG/2ML IV SOLN
INTRAVENOUS | Status: DC | PRN
  Administered 2021-01-08: 200 mg via INTRAVENOUS

## 2021-01-08 MED ORDER — FENTANYL CITRATE (PF) 250 MCG/5ML IJ SOLN
INTRAMUSCULAR | Status: DC | PRN
  Administered 2021-01-08 (×2): 50 ug via INTRAVENOUS

## 2021-01-08 MED ORDER — ROCURONIUM BROMIDE 10 MG/ML (PF) SYRINGE
PREFILLED_SYRINGE | INTRAVENOUS | Status: AC
  Filled 2021-01-08: qty 10

## 2021-01-08 MED ORDER — OXYCODONE HCL 5 MG PO TABS
5.0000 mg | ORAL_TABLET | ORAL | Status: DC | PRN
  Administered 2021-01-08 – 2021-01-11 (×9): 10 mg via ORAL
  Filled 2021-01-08 (×10): qty 2

## 2021-01-08 MED ORDER — CEFAZOLIN SODIUM-DEXTROSE 2-4 GM/100ML-% IV SOLN
2.0000 g | INTRAVENOUS | Status: AC
  Administered 2021-01-08: 2 g via INTRAVENOUS

## 2021-01-08 MED ORDER — ONDANSETRON HCL 4 MG PO TABS: 4.0000 mg | ORAL_TABLET | Freq: Four times a day (QID) | ORAL | Status: DC | PRN

## 2021-01-08 MED ORDER — TRANEXAMIC ACID-NACL 1000-0.7 MG/100ML-% IV SOLN
1000.0000 mg | INTRAVENOUS | Status: AC
  Administered 2021-01-08: 1000 mg via INTRAVENOUS

## 2021-01-08 MED ORDER — ONDANSETRON HCL 4 MG/2ML IJ SOLN
INTRAMUSCULAR | Status: AC
  Filled 2021-01-08: qty 2

## 2021-01-08 MED ORDER — METOCLOPRAMIDE HCL 5 MG/ML IJ SOLN: 5.0000 mg | Freq: Three times a day (TID) | INTRAMUSCULAR | Status: DC | PRN

## 2021-01-08 MED ORDER — BUPIVACAINE LIPOSOME 1.3 % IJ SUSP
INTRAMUSCULAR | Status: DC | PRN
  Administered 2021-01-08: 10 mL

## 2021-01-08 MED ORDER — IRRISEPT - 450ML BOTTLE WITH 0.05% CHG IN STERILE WATER, USP 99.95% OPTIME
TOPICAL | Status: DC | PRN
  Administered 2021-01-08: 450 mL via TOPICAL

## 2021-01-08 MED ORDER — METHOCARBAMOL 500 MG PO TABS
500.0000 mg | ORAL_TABLET | Freq: Four times a day (QID) | ORAL | Status: DC | PRN
  Administered 2021-01-08 – 2021-01-10 (×3): 500 mg via ORAL
  Filled 2021-01-08 (×4): qty 1

## 2021-01-08 MED ORDER — PHENYLEPHRINE 40 MCG/ML (10ML) SYRINGE FOR IV PUSH (FOR BLOOD PRESSURE SUPPORT)
PREFILLED_SYRINGE | INTRAVENOUS | Status: DC | PRN
  Administered 2021-01-08 (×4): 80 ug via INTRAVENOUS

## 2021-01-08 MED ORDER — VANCOMYCIN HCL 1000 MG IV SOLR
INTRAVENOUS | Status: AC
  Filled 2021-01-08: qty 1000

## 2021-01-08 MED ORDER — FENTANYL CITRATE (PF) 100 MCG/2ML IJ SOLN
25.0000 ug | INTRAMUSCULAR | Status: DC | PRN
  Administered 2021-01-08: 50 ug via INTRAVENOUS

## 2021-01-08 MED ORDER — LIDOCAINE 2% (20 MG/ML) 5 ML SYRINGE
INTRAMUSCULAR | Status: AC
  Filled 2021-01-08: qty 5

## 2021-01-08 MED ORDER — METOCLOPRAMIDE HCL 5 MG PO TABS: 5.0000 mg | ORAL_TABLET | Freq: Three times a day (TID) | ORAL | Status: DC | PRN

## 2021-01-08 MED ORDER — CEFAZOLIN SODIUM-DEXTROSE 2-4 GM/100ML-% IV SOLN
2.0000 g | Freq: Four times a day (QID) | INTRAVENOUS | Status: AC
  Administered 2021-01-08 (×2): 2 g via INTRAVENOUS
  Filled 2021-01-08 (×2): qty 100

## 2021-01-08 MED ORDER — OXYCODONE HCL 5 MG/5ML PO SOLN: 5.0000 mg | Freq: Once | ORAL | Status: DC | PRN

## 2021-01-08 MED ORDER — PROPOFOL 10 MG/ML IV BOLUS
INTRAVENOUS | Status: DC | PRN
  Administered 2021-01-08: 200 mg via INTRAVENOUS

## 2021-01-08 MED ORDER — LIDOCAINE 2% (20 MG/ML) 5 ML SYRINGE
INTRAMUSCULAR | Status: DC | PRN
  Administered 2021-01-08: 100 mg via INTRAVENOUS

## 2021-01-08 MED ORDER — INSULIN ASPART 100 UNIT/ML IJ SOLN
4.0000 [IU] | Freq: Three times a day (TID) | INTRAMUSCULAR | Status: DC
  Administered 2021-01-08 – 2021-01-11 (×6): 4 [IU] via SUBCUTANEOUS

## 2021-01-08 MED ORDER — METFORMIN HCL 500 MG PO TABS
500.0000 mg | ORAL_TABLET | Freq: Two times a day (BID) | ORAL | Status: DC
  Administered 2021-01-09 – 2021-01-11 (×5): 500 mg via ORAL
  Filled 2021-01-08 (×5): qty 1

## 2021-01-08 MED ORDER — MENTHOL 3 MG MT LOZG: 1.0000 | LOZENGE | OROMUCOSAL | Status: DC | PRN

## 2021-01-08 MED ORDER — MIDAZOLAM HCL 5 MG/5ML IJ SOLN
INTRAMUSCULAR | Status: DC | PRN
  Administered 2021-01-08: 2 mg via INTRAVENOUS

## 2021-01-08 MED ORDER — BUPIVACAINE HCL (PF) 0.5 % IJ SOLN
INTRAMUSCULAR | Status: DC | PRN
  Administered 2021-01-08: 15 mL via PERINEURAL

## 2021-01-08 MED ORDER — 0.9 % SODIUM CHLORIDE (POUR BTL) OPTIME
TOPICAL | Status: DC | PRN
  Administered 2021-01-08: 1000 mL

## 2021-01-08 MED ORDER — ASPIRIN EC 81 MG PO TBEC
81.0000 mg | DELAYED_RELEASE_TABLET | Freq: Every day | ORAL | Status: DC
  Administered 2021-01-08 – 2021-01-11 (×4): 81 mg via ORAL
  Filled 2021-01-08 (×4): qty 1

## 2021-01-08 MED ORDER — TAMSULOSIN HCL 0.4 MG PO CAPS
0.4000 mg | ORAL_CAPSULE | Freq: Every day | ORAL | Status: DC
  Administered 2021-01-09 – 2021-01-11 (×3): 0.4 mg via ORAL
  Filled 2021-01-08 (×3): qty 1

## 2021-01-08 MED ORDER — FENTANYL CITRATE (PF) 100 MCG/2ML IJ SOLN
INTRAMUSCULAR | Status: AC
  Filled 2021-01-08: qty 2

## 2021-01-08 MED ORDER — ROCURONIUM BROMIDE 10 MG/ML (PF) SYRINGE
PREFILLED_SYRINGE | INTRAVENOUS | Status: DC | PRN
  Administered 2021-01-08: 70 mg via INTRAVENOUS

## 2021-01-08 MED ORDER — NAPROXEN 250 MG PO TABS
250.0000 mg | ORAL_TABLET | Freq: Two times a day (BID) | ORAL | Status: DC
  Administered 2021-01-08 – 2021-01-11 (×6): 250 mg via ORAL
  Filled 2021-01-08 (×7): qty 1

## 2021-01-08 MED ORDER — PROPOFOL 10 MG/ML IV BOLUS
INTRAVENOUS | Status: AC
  Filled 2021-01-08: qty 20

## 2021-01-08 MED ORDER — FENTANYL CITRATE (PF) 100 MCG/2ML IJ SOLN: INTRAMUSCULAR | Status: DC | PRN

## 2021-01-08 MED ORDER — ASPIRIN 81 MG PO CHEW: 81.0000 mg | CHEWABLE_TABLET | Freq: Every day | ORAL | Status: DC

## 2021-01-08 MED ORDER — EPHEDRINE SULFATE-NACL 50-0.9 MG/10ML-% IV SOSY
PREFILLED_SYRINGE | INTRAVENOUS | Status: DC | PRN
  Administered 2021-01-08 (×2): 5 mg via INTRAVENOUS

## 2021-01-08 MED ORDER — ACETAMINOPHEN 325 MG PO TABS
325.0000 mg | ORAL_TABLET | Freq: Four times a day (QID) | ORAL | Status: DC | PRN
  Filled 2021-01-08: qty 2

## 2021-01-08 MED ORDER — ONDANSETRON HCL 4 MG/2ML IJ SOLN
4.0000 mg | Freq: Four times a day (QID) | INTRAMUSCULAR | Status: DC | PRN
  Administered 2021-01-10: 4 mg via INTRAVENOUS
  Filled 2021-01-08: qty 2

## 2021-01-08 MED ORDER — PHENYLEPHRINE HCL-NACL 10-0.9 MG/250ML-% IV SOLN
INTRAVENOUS | Status: DC | PRN
  Administered 2021-01-08: 50 ug/min via INTRAVENOUS

## 2021-01-08 MED ORDER — FENTANYL CITRATE (PF) 250 MCG/5ML IJ SOLN
INTRAMUSCULAR | Status: AC
  Filled 2021-01-08: qty 5

## 2021-01-08 MED ORDER — AMLODIPINE BESYLATE 5 MG PO TABS
5.0000 mg | ORAL_TABLET | Freq: Every day | ORAL | Status: DC
  Administered 2021-01-09 – 2021-01-11 (×3): 5 mg via ORAL
  Filled 2021-01-08 (×3): qty 1

## 2021-01-08 MED ORDER — PHENYLEPHRINE 40 MCG/ML (10ML) SYRINGE FOR IV PUSH (FOR BLOOD PRESSURE SUPPORT)
PREFILLED_SYRINGE | INTRAVENOUS | Status: AC
  Filled 2021-01-08: qty 10

## 2021-01-08 MED ORDER — CHLORHEXIDINE GLUCONATE 0.12 % MT SOLN: 15.0000 mL | Freq: Once | OROMUCOSAL | Status: AC

## 2021-01-08 MED ORDER — DEXAMETHASONE SODIUM PHOSPHATE 10 MG/ML IJ SOLN
INTRAMUSCULAR | Status: AC
  Filled 2021-01-08: qty 1

## 2021-01-08 MED ORDER — AMISULPRIDE (ANTIEMETIC) 5 MG/2ML IV SOLN: 10.0000 mg | Freq: Once | INTRAVENOUS | Status: DC | PRN

## 2021-01-08 MED ORDER — ALBUTEROL SULFATE (2.5 MG/3ML) 0.083% IN NEBU: 2.5000 mg | INHALATION_SOLUTION | Freq: Four times a day (QID) | RESPIRATORY_TRACT | Status: DC | PRN

## 2021-01-08 MED ORDER — VANCOMYCIN HCL 1000 MG IV SOLR
INTRAVENOUS | Status: DC | PRN
  Administered 2021-01-08: 1000 mg

## 2021-01-08 MED ORDER — PANTOPRAZOLE SODIUM 20 MG PO TBEC
20.0000 mg | DELAYED_RELEASE_TABLET | Freq: Two times a day (BID) | ORAL | Status: DC
  Administered 2021-01-08 – 2021-01-11 (×7): 20 mg via ORAL
  Filled 2021-01-08 (×7): qty 1

## 2021-01-08 MED ORDER — ONDANSETRON HCL 4 MG/2ML IJ SOLN: 4.0000 mg | Freq: Once | INTRAMUSCULAR | Status: DC | PRN

## 2021-01-08 MED ORDER — CEFAZOLIN SODIUM-DEXTROSE 2-4 GM/100ML-% IV SOLN
INTRAVENOUS | Status: AC
  Filled 2021-01-08: qty 100

## 2021-01-08 MED ORDER — ONDANSETRON HCL 4 MG/2ML IJ SOLN: INTRAMUSCULAR | Status: DC | PRN

## 2021-01-08 SURGICAL SUPPLY — 84 items
ALCOHOL 70% 16 OZ (MISCELLANEOUS) ×2 IMPLANT
AUG COMP REV MI TAPER ADAPTER (Joint) ×2 IMPLANT
AUGMENT COMP REV MI TAPR ADPTR (Joint) ×1 IMPLANT
BEARING HUMERAL SHLDER 36M STD (Shoulder) ×1 IMPLANT
BIT DRILL 2.7 W/STOP DISP (BIT) ×2 IMPLANT
BIT DRILL QUICK REL 1/8 2PK SL (DRILL) ×1 IMPLANT
BIT DRILL TWIST 2.7 (BIT) ×2 IMPLANT
BLADE SAW SGTL 13X75X1.27 (BLADE) ×2 IMPLANT
CHLORAPREP W/TINT 26 (MISCELLANEOUS) ×2 IMPLANT
CLSR STERI-STRIP ANTIMIC 1/2X4 (GAUZE/BANDAGES/DRESSINGS) ×2 IMPLANT
COOLER ICEMAN CLASSIC (MISCELLANEOUS) ×2 IMPLANT
COVER SURGICAL LIGHT HANDLE (MISCELLANEOUS) ×2 IMPLANT
DRAPE INCISE IOBAN 66X45 STRL (DRAPES) ×2 IMPLANT
DRAPE U-SHAPE 47X51 STRL (DRAPES) ×4 IMPLANT
DRILL QUICK RELEASE 1/8 INCH (DRILL) ×2
DRSG AQUACEL AG ADV 3.5X10 (GAUZE/BANDAGES/DRESSINGS) ×4 IMPLANT
ELECT BLADE 4.0 EZ CLEAN MEGAD (MISCELLANEOUS) ×2
ELECT REM PT RETURN 9FT ADLT (ELECTROSURGICAL) ×2
ELECTRODE BLDE 4.0 EZ CLN MEGD (MISCELLANEOUS) ×1 IMPLANT
ELECTRODE REM PT RTRN 9FT ADLT (ELECTROSURGICAL) ×1 IMPLANT
GAUZE SPONGE 4X4 12PLY STRL LF (GAUZE/BANDAGES/DRESSINGS) ×2 IMPLANT
GLENOID SPHERE 36MM CVD +3 (Orthopedic Implant) ×2 IMPLANT
GLOVE SRG 8 PF TXTR STRL LF DI (GLOVE) ×1 IMPLANT
GLOVE SURG LTX SZ7 (GLOVE) ×2 IMPLANT
GLOVE SURG LTX SZ8 (GLOVE) ×2 IMPLANT
GLOVE SURG UNDER POLY LF SZ7 (GLOVE) ×2 IMPLANT
GLOVE SURG UNDER POLY LF SZ8 (GLOVE) ×2
GOWN STRL REUS W/ TWL LRG LVL3 (GOWN DISPOSABLE) ×2 IMPLANT
GOWN STRL REUS W/ TWL XL LVL3 (GOWN DISPOSABLE) IMPLANT
GOWN STRL REUS W/TWL LRG LVL3 (GOWN DISPOSABLE) ×4
GOWN STRL REUS W/TWL XL LVL3 (GOWN DISPOSABLE)
GUIDE MODEL REV SHLD RT (ORTHOPEDIC DISPOSABLE SUPPLIES) ×2 IMPLANT
HYDROGEN PEROXIDE 16OZ (MISCELLANEOUS) ×2 IMPLANT
JET LAVAGE IRRISEPT WOUND (IRRIGATION / IRRIGATOR) ×2
KIT BASIN OR (CUSTOM PROCEDURE TRAY) ×2 IMPLANT
KIT TURNOVER KIT B (KITS) ×2 IMPLANT
LAVAGE JET IRRISEPT WOUND (IRRIGATION / IRRIGATOR) ×1 IMPLANT
LOOP VESSEL MAXI BLUE (MISCELLANEOUS) ×2 IMPLANT
MANIFOLD NEPTUNE II (INSTRUMENTS) ×2 IMPLANT
NDL SUT 6 .5 CRC .975X.05 MAYO (NEEDLE) IMPLANT
NEEDLE MAYO TAPER (NEEDLE)
NEEDLE TAPERED W/ NITINOL LOOP (MISCELLANEOUS) ×4 IMPLANT
NS IRRIG 1000ML POUR BTL (IV SOLUTION) ×2 IMPLANT
PACK SHOULDER (CUSTOM PROCEDURE TRAY) ×2 IMPLANT
PAD ARMBOARD 7.5X6 YLW CONV (MISCELLANEOUS) ×4 IMPLANT
PAD COLD SHLDR WRAP-ON (PAD) ×4 IMPLANT
PASSER SUT SWANSON 36MM LOOP (INSTRUMENTS) ×2 IMPLANT
PIN THREADED REVERSE (PIN) ×4 IMPLANT
REAMER GUIDE BUSHING SURG DISP (MISCELLANEOUS) ×2 IMPLANT
REAMER GUIDE W/SCREW AUG (MISCELLANEOUS) ×2 IMPLANT
RESTRAINT HEAD UNIVERSAL NS (MISCELLANEOUS) ×2 IMPLANT
SCREW BONE LOCKING 4.75X30X3.5 (Screw) ×2 IMPLANT
SCREW BONE STRL 6.5MMX30MM (Screw) ×2 IMPLANT
SCREW LOCKING 4.75MMX15MM (Screw) ×4 IMPLANT
SCREW LOCKING STRL 4.75X25X3.5 (Screw) ×2 IMPLANT
SHOULDER HUMERAL BEAR 36M STD (Shoulder) ×2 IMPLANT
SLING ARM IMMOBILIZER LRG (SOFTGOODS) ×2 IMPLANT
SLING ARM IMMOBILIZER XL (CAST SUPPLIES) ×2 IMPLANT
SOL PREP POV-IOD 4OZ 10% (MISCELLANEOUS) ×2 IMPLANT
SPONGE LAP 18X18 RF (DISPOSABLE) ×2 IMPLANT
SPONGE T-LAP 18X18 ~~LOC~~+RFID (SPONGE) ×4 IMPLANT
STEM HUMERAL STRL 14MMX140MM (Stem) ×2 IMPLANT
STRIP CLOSURE SKIN 1/2X4 (GAUZE/BANDAGES/DRESSINGS) ×2 IMPLANT
SUCTION FRAZIER HANDLE 10FR (MISCELLANEOUS) ×2
SUCTION TUBE FRAZIER 10FR DISP (MISCELLANEOUS) ×1 IMPLANT
SUT BROADBAND TAPE 2PK 1.5 (SUTURE) ×6 IMPLANT
SUT BROADBAND TAPE 2PK 2.3 (SUTURE) ×2 IMPLANT
SUT MAXBRAID (SUTURE) IMPLANT
SUT MNCRL AB 3-0 PS2 18 (SUTURE) ×2 IMPLANT
SUT SILK 2 0 (SUTURE) ×2
SUT SILK 2 0 TIES 10X30 (SUTURE) ×2 IMPLANT
SUT SILK 2-0 18XBRD TIE 12 (SUTURE) ×1 IMPLANT
SUT SILK 3 0 (SUTURE) ×2
SUT SILK 3-0 18XBRD TIE 12 (SUTURE) ×1 IMPLANT
SUT VIC AB 0 CT1 27 (SUTURE) ×4
SUT VIC AB 0 CT1 27XBRD ANBCTR (SUTURE) ×2 IMPLANT
SUT VIC AB 1 CT1 27 (SUTURE) ×8
SUT VIC AB 1 CT1 27XBRD ANBCTR (SUTURE) ×4 IMPLANT
SUT VIC AB 2-0 CT1 27 (SUTURE) ×6
SUT VIC AB 2-0 CT1 TAPERPNT 27 (SUTURE) ×3 IMPLANT
SUT VICRYL 0 UR6 27IN ABS (SUTURE) ×4 IMPLANT
TOWEL GREEN STERILE (TOWEL DISPOSABLE) ×2 IMPLANT
TRAY HUM MINI SHOULDER +3 40 (Joint) ×2 IMPLANT
WATER STERILE IRR 1000ML POUR (IV SOLUTION) ×2 IMPLANT

## 2021-01-08 NOTE — Evaluation (Signed)
Occupational Therapy Evaluation Patient Details Name: Joshua [name] MRN: [id number] DOB: 1961/07/12 Today's Date: 01/08/2021    History of Present Illness 60 y.o. M admitted to [facility] on 6/24 for s/p Reverse Total Shoulder Replacement. Pt's PMH is significant for Brain tumor, Meningioma's w/ resection, GERD, Anxiety, L foot drop, HTN, Neuropathy, and seizures.   Clinical Impression   Pt admitted for procedure listed above. PTA pt reported that he was independent with all ADL's and IADL's, using compensatory techniques as needed due to shoulder pain. At the time of the evaluation, pt was educated on shoulder precautions, which he was only able to remember 1 out of 3 by the end of the session, and he was education on compensatory strategies for bathing and dressing following the precautions. Additionally, pt demonstrated decreased balance and strength with mobility, as he used a cane in his R hand prior to this surgery. Pt will be trial using a platform walker and using cane in his left hand in next sessions. Acute OT will continue to follow up and address concerns listed below.     Follow Up Recommendations  Follow surgeon's recommendation for DC plan and follow-up therapies;Supervision - Intermittent    Equipment Recommendations  Tub/shower seat    Recommendations for Other Services PT consult     Precautions / Restrictions Precautions Precautions: Shoulder;Fall Type of Shoulder Precautions: Total Shoulder Shoulder Interventions: Shoulder sling/immobilizer;At all times;Off for dressing/bathing/exercises Precaution Booklet Issued: No Precaution Comments: Educated pt on precautions and donning/doffing sling Required Braces or Orthoses: Sling Restrictions Weight Bearing Restrictions: No      Mobility Bed Mobility Overal bed mobility: Needs Assistance Bed Mobility: Supine to Sit;Sit to Supine     Supine to sit: Mod assist;HOB elevated Sit to supine: Mod assist;HOB elevated    General bed mobility comments: P tneeded assistance bringing legs into and out of bed, as well as elevating trunk without pushing up through RUE.    Transfers Overall transfer level: Needs assistance Equipment used: 1 person hand held assist Transfers: Sit to/from Stand Sit to Stand: Min assist         General transfer comment: Min A to power up and steady    Balance                                           ADL either performed or assessed with clinical judgement   ADL Overall ADL's : Needs assistance/impaired Eating/Feeding: Set up;Sitting   Grooming: Set up;Sitting   Upper Body Bathing: Sitting;Minimal assistance   Lower Body Bathing: Moderate assistance;Sitting/lateral leans;Sit to/from stand   Upper Body Dressing : Sitting;Minimal assistance   Lower Body Dressing: Moderate assistance;Sit to/from stand;Sitting/lateral leans   Toilet Transfer: Minimal assistance;Stand-pivot;Moderate assistance;Ambulation   Toileting- Clothing Manipulation and Hygiene: Moderate assistance;Sitting/lateral lean;Sit to/from stand   Tub/ Shower Transfer: Minimal assistance;Stand-pivot;Moderate assistance;Ambulation   Functional mobility during ADLs: Minimal assistance (HHA) General ADL Comments: Pt at set up level for all table top ADL's, Min A for UB ADL's,  and requiring min-mod A for all transfers and LB ADL's due to balance deficits.     Vision Baseline Vision/History: Wears glasses Wears Glasses: Reading only Patient Visual Report: No change from baseline Vision Assessment?: No apparent visual deficits     Perception Perception Perception Tested?: No   Praxis Praxis Praxis tested?: Not tested    Pertinent Vitals/Pain Pain Assessment: 0-10 Pain Score:  8  Pain Location: R shoulder Pain Descriptors / Indicators: Aching;Burning;Discomfort;Grimacing;Guarding Pain Intervention(s): Limited activity within patient's tolerance;Monitored during  session;Repositioned     Hand Dominance Right   Extremity/Trunk Assessment Upper Extremity Assessment Upper Extremity Assessment: RUE deficits/detail RUE Deficits / Details: Total shoulder surgery. ROM of elbow, Wrist, and hand WFL. No flexion, abduction or external rotation past 30 degrees. RUE Sensation: decreased light touch RUE Coordination: decreased fine motor;decreased gross motor   Lower Extremity Assessment Lower Extremity Assessment: Defer to PT evaluation   Cervical / Trunk Assessment Cervical / Trunk Assessment: Kyphotic   Communication Communication Communication: No difficulties   Cognition Arousal/Alertness: Awake/alert Behavior During Therapy: WFL for tasks assessed/performed Overall Cognitive Status: Within Functional Limits for tasks assessed                                     General Comments  VSS on RA, pt bandage appears clean and dry.    Exercises Exercises: Shoulder Shoulder Exercises Elbow Flexion: AROM;5 reps;Both Elbow Extension: AROM;Both;5 reps Wrist Flexion: AROM;Both;5 reps Wrist Extension: AROM;Both;5 reps Digit Composite Flexion: AROM;Both;5 reps   Shoulder Instructions Shoulder Instructions Donning/doffing shirt without moving shoulder: Minimal assistance Method for sponge bathing under operated UE: Minimal assistance Donning/doffing sling/immobilizer: Moderate assistance Correct positioning of sling/immobilizer: Minimal assistance ROM for elbow, wrist and digits of operated UE: Supervision/safety Sling wearing schedule (on at all times/off for ADL's): Supervision/safety Proper positioning of operated UE when showering: Minimal assistance Positioning of UE while sleeping: Minimal assistance    Home Living Family/patient expects to be discharged to:: Private residence Living Arrangements: Non-relatives/Friends Available Help at Discharge: Friend(s) Type of Home: House Home Access: Stairs to enter Engineer, site of Steps: 3-4 stairs   Home Layout: One level     Bathroom Shower/Tub: Teacher, early years/pre: Standard Bathroom Accessibility: Yes How Accessible: Accessible via walker Home Equipment: [place] - single point          Prior Functioning/Environment Level of Independence: Independent with assistive device(s)        Comments: Pt reports using a cane and having compensatory techniques for ADL's due to increased shoulder pain prior to surgery. Was starting to attend aquatic therapy prior to rotator cuff tear.        OT Problem List: Decreased strength;Decreased range of motion;Decreased activity tolerance;Impaired balance (sitting and/or standing);Decreased coordination;Decreased safety awareness;Decreased knowledge of use of DME or AE;Decreased knowledge of precautions;Impaired sensation;Impaired UE functional use;Pain      OT Treatment/Interventions: Self-care/ADL training;Therapeutic exercise;Energy conservation;DME and/or AE instruction;Therapeutic activities;Patient/family education;Balance training    OT Goals(Current goals can be found in the care plan section) Acute Rehab OT Goals Patient Stated Goal: To be independent again OT Goal Formulation: With patient Time For Goal Achievement: 01/22/21 Potential to Achieve Goals: Good ADL Goals Pt Will Perform Upper Body Bathing: with modified independence;with adaptive equipment;sitting Pt Will Perform Upper Body Dressing: with modified independence;sitting Pt Will Transfer to Toilet: with modified independence;ambulating Pt Will Perform Toileting - Clothing Manipulation and hygiene: with modified independence;sitting/lateral leans;sit to/from stand Pt/caregiver will Perform Home Exercise Program: Increased ROM;Increased strength;Both right and left upper extremity;With written HEP provided  OT Frequency: Min 2X/week   Barriers to D/C:            Co-evaluation              AM-PAC OT "6 Clicks"  Daily Activity  Outcome Measure Help from another person eating meals?: A Little Help from another person taking care of personal grooming?: A Little Help from another person toileting, which includes using toliet, bedpan, or urinal?: A Lot Help from another person bathing (including washing, rinsing, drying)?: A Lot Help from another person to put on and taking off regular upper body clothing?: A Little Help from another person to put on and taking off regular lower body clothing?: A Lot 6 Click Score: 15   End of Session Equipment Utilized During Treatment: Gait belt Nurse Communication: Mobility status  Activity Tolerance: Patient tolerated treatment well Patient left: in bed;with call bell/phone within reach;with bed alarm set  OT Visit Diagnosis: Unsteadiness on feet (R26.81);Other abnormalities of gait and mobility (R26.89);Muscle weakness (generalized) (M62.81)                Time: 3536-1443 OT Time Calculation (min): 28 min Charges:  OT General Charges $OT Visit: 1 Visit OT Evaluation $OT Eval Moderate Complexity: 1 Mod OT Treatments $Self Care/Home Management : 8-22 mins  Dung Prien H., OTR/L Acute Rehabilitation  Jalayiah Bibian Elane Ariann Khaimov 01/08/2021, 6:12 PM

## 2021-01-08 NOTE — Transfer of Care (Signed)
Immediate Anesthesia Transfer of Care Note  Patient: TRELON PLUSH  Procedure(s) Performed: RIGHT REVERSE SHOULDER ARTHROPLASTY (Right: Shoulder)  Patient Location: PACU  Anesthesia Type:General and Regional  Level of Consciousness: oriented, drowsy and patient cooperative  Airway & Oxygen Therapy: Patient Spontanous Breathing and Patient connected to nasal cannula oxygen  Post-op Assessment: Report given to RN and Post -op Vital signs reviewed and stable  Post vital signs: Reviewed  Last Vitals:  Vitals Value Taken Time  BP 127/73 01/08/21 1058  Temp    Pulse 93 01/08/21 1058  Resp 18 01/08/21 1100  SpO2 99 % 01/08/21 1058  Vitals shown include unvalidated device data.  Last Pain:  Vitals:   01/08/21 0614  TempSrc:   PainSc: 8       Patients Stated Pain Goal: 8 (62/69/48 [id number])  Complications: No notable events documented.

## 2021-01-08 NOTE — Plan of Care (Signed)

## 2021-01-08 NOTE — Anesthesia Postprocedure Evaluation (Signed)
Anesthesia Post Note  Patient: Joshua [name]  Procedure(s) Performed: RIGHT REVERSE SHOULDER ARTHROPLASTY (Right: Shoulder)     Patient location during evaluation: PACU Anesthesia Type: General Level of consciousness: awake and alert Pain management: pain level controlled Vital Signs Assessment: post-procedure vital signs reviewed and stable Respiratory status: spontaneous breathing, nonlabored ventilation and respiratory function stable Cardiovascular status: blood pressure returned to baseline and stable Postop Assessment: no apparent nausea or vomiting Anesthetic complications: no   No notable events documented.  Last Vitals:  Vitals:   01/08/21 1157 01/08/21 1301  BP: 115/72 120/75  Pulse: 76 73  Resp: 17 17  Temp: (!) 36.4 C (!) 36.4 C  SpO2: 100% 96%    Last Pain:  Vitals:   01/08/21 1301  TempSrc: Oral  PainSc:                  Lidia Collum

## 2021-01-08 NOTE — Anesthesia Procedure Notes (Signed)
Anesthesia Regional Block: Interscalene brachial plexus block   Pre-Anesthetic Checklist: , timeout performed,  Correct Patient, Correct Site, Correct Laterality,  Correct Procedure, Correct Position, site marked,  Risks and benefits discussed,  Surgical consent,  Pre-op evaluation,  At surgeon's request and post-op pain management  Laterality: Left  Prep: chloraprep       Needles:  Injection technique: Single-shot  Needle Type: Echogenic Stimulator Needle     Needle Length: 10cm  Needle Gauge: 20     Additional Needles:   Procedures:,,,, ultrasound used (permanent image in chart),,    Narrative:  Start time: 01/08/2021 7:05 AM End time: 01/08/2021 7:09 AM Injection made incrementally with aspirations every 5 mL.  Performed by: Personally  Anesthesiologist: Lidia Collum, MD  Additional Notes: Standard monitors applied. Skin prepped. Good needle visualization with ultrasound. Injection made in 5cc increments with no resistance to injection. Patient tolerated the procedure well.

## 2021-01-08 NOTE — H&P (Signed)
Joshua [name] is an 60 y.o. male.   Chief Complaint: right shoulder pain HPI: Joshua [name] is a 60 year old patient with right shoulder pain.  He underwent right shoulder scope and debridement 06/09/2020 with significant pain before and after surgery.  Was found to have early wear and tear in the shoulder joint along with retracted nonrepairable infraspinatus and supraspinatus rotator cuff tendon tears.  MRI scan is reviewed.  Patient describes both pain and weakness.  Uses a cane in the right hand.  Has had 2 brain surgeries for hemangiomas.  Also has had hip replacements without difficulty.  Patient states his legs are weak.  Has a history of seizures but none in the past year.  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   Meningioma ([place])    Meningioma, recurrent of brain ([place]) 01/31/2019   Neuropathy 12/05/2019   Pre-diabetes    Rotator cuff tear    right   Seizures ([place])    11/27/19 last sz 1 wk ago    Past Surgical History:  Procedure Laterality Date   APPLICATION OF CRANIAL NAVIGATION N/A 01/10/2019   Procedure: APPLICATION OF CRANIAL NAVIGATION;  Surgeon: Erline Levine, MD;  Location: [facility];  Service: Neurosurgery;  Laterality: N/A;   CATARACT EXTRACTION Right    CRANIOTOMY Right 11/06/2012   Procedure: CRANIOTOMY TUMOR EXCISION;  Surgeon: Erline Levine, MD;  Location: [facility];  Service: Neurosurgery;  Laterality: Right;  Right Parasagittal craniotomy for meningioma with Ste[facility]   CRANIOTOMY Right 01/10/2019   Procedure: Right Parasagittal Craniotomy for Tumor;  Surgeon: Erline Levine, MD;  Location: [facility];   Service: Neurosurgery;  Laterality: Right;  Right parasagittal craniotomy for tumor   ESOPHAGOGASTRODUODENOSCOPY     JOINT REPLACEMENT Right 2018   right hip   right knee arthroscopy     SHOULDER ARTHROSCOPY Right 06/09/2020   Procedure: RIGHT SHOULDER ARTHROSCOPY AND DEBRIDEMENT;  Surgeon: Newt Minion, MD;  Location: [facility];  Service: Orthopedics;  Laterality: Right;    Family History  Problem Relation Age of Onset   Hypertension Mother    Dementia Mother    Diabetes Mother    Hypertension Father    CAD Father    Diabetes Father    CAD Brother    Social History:  reports that he quit smoking about 1 years ago. His smoking use included cigarettes. He has never used smokeless tobacco. He reports previous alcohol use. He reports that he does not use drugs.  Allergies: No Known Allergies  Medications Prior to Admission  Medication Sig Dispense Refill   acetaminophen (TYLENOL) 500 MG tablet Take 500 mg by mouth every 6 (six) hours as needed for moderate pain or headache.     albuterol (VENTOLIN HFA) 108 (90 Base) MCG/ACT inhaler Inhale 2 puffs into the lungs every 6 (six) hours as needed for wheezing or shortness of breath. 8 g 2   amLODipine (NORVASC) 5 MG tablet Take 1 tablet (5 mg total) by mouth daily. To lower blood pressure 30 tablet 4   aspirin 81 MG chewable tablet Chew  81 mg by mouth daily.     atorvastatin (LIPITOR) 20 MG tablet TAKE 1 TABLET BY MOUTH EVERY DAY (Patient taking differently: Take 20 mg by mouth daily.) 90 tablet 1   bisacodyl (DULCOLAX) 5 MG EC tablet Take 5 mg by mouth daily as needed for moderate constipation.     clonazePAM (KLONOPIN) 0.5 MG tablet Take 1 tablet (0.5 mg total) by mouth 2 (two) times daily. 60 tablet 1   Lacosamide (VIMPAT) 100 MG TABS TAKE 2 TABLETS IN MORNING AND 2 AT BEDTIME (Patient taking differently: Take 200 mg by mouth 2 (two) times daily.) 60 tablet 3   lamoTRIgine (LAMICTAL) 100 MG tablet TAKE 2 TABLETS BY MOUTH  TWICE A DAY (Patient taking differently: Take 200 mg by mouth 2 (two) times daily.) 120 tablet 0   metFORMIN (GLUCOPHAGE) 500 MG tablet Take 1 tablet by mouth 2 (two) times daily.     pantoprazole (PROTONIX) 20 MG tablet Take 1 tablet (20 mg total) by mouth 2 (two) times daily before a meal. 60 tablet 3   tamsulosin (FLOMAX) 0.4 MG CAPS capsule Take 1 capsule (0.4 mg total) by mouth daily. 90 capsule 3   Accu-Chek Softclix Lancets lancets Use as instructed 100 each 12   benzonatate (TESSALON) 100 MG capsule Take 1 capsule (100 mg total) by mouth 2 (two) times daily as needed for cough. (Patient not taking: No sig reported) 20 capsule 0   Blood Glucose Monitoring Suppl (ACCU-CHEK GUIDE ME) w/Device KIT Use to check blood sugar once daily. 1 kit 0   glucose blood (ACCU-CHEK GUIDE) test strip Use to check blood sugar once daily. 100 each 6   Misc. Devices MISC Please provide BP cuff for patient 1 each 0   ondansetron (ZOFRAN) 4 MG tablet Take 1 tablet (4 mg total) by mouth every 8 (eight) hours as needed for nausea or vomiting. (Patient not taking: No sig reported) 20 tablet 0   traMADol (ULTRAM) 50 MG tablet Take 1 tablet (50 mg total) by mouth every 12 (twelve) hours as needed. (Patient not taking: No sig reported) 30 tablet 0   VITAMIN A PO Take 1 capsule by mouth daily.      Results for orders placed or performed during the hospital encounter of 01/08/21 (from the past 48 hour(s))  Glucose, capillary     Status: Abnormal   Collection Time: 01/08/21  5:42 AM  Result Value Ref Range   Glucose-Capillary 125 (H) 70 - 99 mg/dL    Comment: Glucose reference range applies only to samples taken after fasting for at least 8 hours.   No results found.  Review of Systems  Musculoskeletal:  Positive for arthralgias.   Blood pressure (!) 146/92, pulse 74, temperature 98.7 F (37.1 C), temperature source Oral, resp. rate 18, height 5' 9"  (1.753 m), weight 86 kg, SpO2 98 %. Physical Exam Vitals  reviewed.  HENT:     Head: Normocephalic.     Nose: Nose normal.     Mouth/Throat:     Mouth: Mucous membranes are moist.  Eyes:     Pupils: Pupils are equal, round, and reactive to light.  Cardiovascular:     Rate and Rhythm: Normal rate.     Pulses: Normal pulses.  Pulmonary:     Effort: Pulmonary effort is normal.  Abdominal:     General: Abdomen is flat.  Musculoskeletal:     Cervical back: Normal range of motion.  Skin:    General: Skin is warm.  Capillary Refill: Capillary refill takes less than 2 seconds.  Neurological:     General: No focal deficit present.     Mental Status: He is alert.  Psychiatric:        Mood and Affect: Mood normal.    Ortho exam demonstrates well-healed surgical incisions around the right shoulder.  Patient has weakness to infraspinatus and supraspinatus testing on the right compared to the left.  Subscap strength intact.  Passive external rotation at 15 degrees of abduction is about 60 degrees bilaterally.  Isolated glenohumeral abduction in his 80 actively and 90 forward flexion active.  Passively I can get him up to about 150.  No discrete AC joint tenderness to direct palpation.  No masses lymphadenopathy or skin changes noted in the shoulder girdle region on the right.  Deltoid fires  Assessment/Plan Impression is rotator cuff arthropathy with pain and weakness in the right shoulder.  Patient is 15 but is not particularly active.  We discussed operative treatment options which she is interested in at this time.  Discussed lower trapezius transfer which would help with some pain as well as some of his weakness.  Reverse shoulder replacement also under consideration as a more predictable surgery but difficult to recommend because of his young age.  After extensive discussion the patient is most interested in reverse shoulder replacement.  I think for his level of activity and for predictable pain relief and improving his chances to get overhead that  is likely his best bet.  The risk and benefits of the procedure are discussed with the patient including not limited to infection nerve vessel damage incomplete pain relief as well as the lifetime lifting restriction on that right shoulder.  Patient understands risk and benefits and wishes to proceed  Anderson Malta, MD 01/08/2021, 7:07 AM

## 2021-01-08 NOTE — Brief Op Note (Signed)
   01/08/2021  10:55 AM  PATIENT:  Joshua [name]  60 y.o. male  PRE-OPERATIVE DIAGNOSIS:  right shoulder rotator cuff arthropathy  POST-OPERATIVE DIAGNOSIS:  right shoulder rotator cuff arthropathy  PROCEDURE:  Procedure(s): RIGHT REVERSE SHOULDER ARTHROPLASTY  SURGEON:  Surgeon(s): Meredith Pel, MD  ASSISTANT: magnant pa  ANESTHESIA:   general  EBL: 100 ml    Total I/O In: 1600 [I.V.:1500; IV Piggyback:100] Out: 100 [Blood:100]  BLOOD ADMINISTERED: none  DRAINS: none   LOCAL MEDICATIONS USED:  vanc powder  SPECIMEN:  No Specimen  COUNTS:  YES  TOURNIQUET:  * No tourniquets in log *  DICTATION: .Other Dictation: Dictation Number [id number]  PLAN OF CARE: Admit for overnight observation  PATIENT DISPOSITION:  PACU - hemodynamically stable

## 2021-01-08 NOTE — Anesthesia Procedure Notes (Signed)
Procedure Name: Intubation Date/Time: 01/08/2021 7:34 AM Performed by: Jenne Campus, CRNA Pre-anesthesia Checklist: Patient identified, Emergency Drugs available, Suction available and Patient being monitored Patient Re-evaluated:Patient Re-evaluated prior to induction Oxygen Delivery Method: Circle System Utilized Preoxygenation: Pre-oxygenation with 100% oxygen Induction Type: IV induction Ventilation: Mask ventilation without difficulty Laryngoscope Size: Miller and 3 Tube type: Oral Tube size: 7.5 mm Number of attempts: 1 Airway Equipment and Method: Stylet Placement Confirmation: ETT inserted through vocal cords under direct vision, positive ETCO2 and breath sounds checked- equal and bilateral Secured at: 23 cm Tube secured with: Tape Dental Injury: Teeth and Oropharynx as per pre-operative assessment

## 2021-01-09 ENCOUNTER — Other Ambulatory Visit: Payer: Self-pay

## 2021-01-09 LAB — GLUCOSE, CAPILLARY
Glucose-Capillary: 116 mg/dL — ABNORMAL HIGH (ref 70–99)
Glucose-Capillary: 120 mg/dL — ABNORMAL HIGH (ref 70–99)
Glucose-Capillary: 127 mg/dL — ABNORMAL HIGH (ref 70–99)
Glucose-Capillary: 140 mg/dL — ABNORMAL HIGH (ref 70–99)
Glucose-Capillary: 168 mg/dL — ABNORMAL HIGH (ref 70–99)
Glucose-Capillary: 85 mg/dL (ref 70–99)

## 2021-01-09 MED ORDER — OXYCODONE HCL 5 MG PO TABS: 5.0000 mg | ORAL_TABLET | ORAL | 0 refills | Status: DC | PRN

## 2021-01-09 MED ORDER — METHOCARBAMOL 500 MG PO TABS: 500.0000 mg | ORAL_TABLET | Freq: Three times a day (TID) | ORAL | 0 refills | Status: DC | PRN

## 2021-01-09 MED ORDER — CLONAZEPAM 0.5 MG PO TABS
0.5000 mg | ORAL_TABLET | Freq: Two times a day (BID) | ORAL | Status: DC
  Administered 2021-01-09 – 2021-01-11 (×5): 0.5 mg via ORAL
  Filled 2021-01-09 (×5): qty 1

## 2021-01-09 MED ORDER — DOCUSATE SODIUM 100 MG PO CAPS: 100.0000 mg | ORAL_CAPSULE | Freq: Two times a day (BID) | ORAL | 0 refills | Status: DC

## 2021-01-09 NOTE — Evaluation (Signed)
Physical Therapy Evaluation Patient Details Name: Joshua [name] MRN: [id number] DOB: 01/16/1961 Today's Date: 01/09/2021   History of Present Illness  60 y.o. M admitted [facility] on 6/24 for s/p Right Reverse Total Shoulder Replacement. Pt's PMH is significant for Brain tumor, Meningioma's w/ resection, GERD, Anxiety, L foot drop, HTN, Neuropathy, and seizures.  Clinical Impression  Patient is s/p above surgery resulting in functional limitations due to the deficits listed below (see PT Problem List).  Patient will benefit from skilled PT to increase their independence and safety with mobility to allow discharge to friends house where he can have care starting on Monday. Used a crutch on the left side today as prior to surgery he used a cane on the right side. His mobility is fairly limited at present.        Follow Up Recommendations Home health PT;Supervision for mobility/OOB    Equipment Recommendations  Crutches    Recommendations for Other Services       Precautions / Restrictions Precautions Precautions: Shoulder;Fall Type of Shoulder Precautions: Total Shoulder Shoulder Interventions: Shoulder sling/immobilizer;At all times;Off for dressing/bathing/exercises Required Braces or Orthoses: Sling (For RUE) Restrictions Weight Bearing Restrictions: Yes RUE Weight Bearing: Non weight bearing (However MD did write pt could use cane on RUE)      Mobility  Bed Mobility Overal bed mobility: Needs Assistance Bed Mobility: Supine to Sit     Supine to sit: Min assist;HOB elevated     General bed mobility comments: Assistance with LEs and trunk required    Transfers Overall transfer level: Needs assistance Equipment used: None Transfers: Sit to/from Stand Sit to Stand: Min assist         General transfer comment: Min A to power up and steady  Ambulation/Gait Ambulation/Gait assistance: Min Web designer (Feet): 8 Feet Assistive device: Crutches (1 crutch in  left hand) Gait Pattern/deviations: Step-to pattern;Decreased stride length;Shuffle Gait velocity: very slow Gait velocity interpretation: <1.31 ft/sec, indicative of household ambulator General Gait Details: difficulty with coordinating gait pattern. Very small steps. No balance loss.  Stairs            Wheelchair Mobility    Modified Rankin (Stroke Patients Only)       Balance Overall balance assessment: Needs assistance                                           Pertinent Vitals/Pain Pain Assessment: 0-10 Pain Score: 8  Pain Location: R shoulder Pain Descriptors / Indicators: Aching;Burning;Discomfort;Grimacing;Guarding Pain Intervention(s): Limited activity within patient's tolerance;Monitored during session;Ice applied;Repositioned    Home Living Family/patient expects to be discharged to:: Private residence Living Arrangements: Non-relatives/Friends Available Help at Discharge: Friend(s) Type of Home: House Home Access: Stairs to enter   [employer] of Steps: 3-4 stairs Home Layout: One level Home Equipment: Cane - single point;Walker - 4 wheels Additional Comments: Pt plans to live with friends for 1 week at discharge and then return to his apartment with help from friends. Will have 24 hour care at friends home if DC is on Monday.    Prior Function Level of Independence: Independent with assistive device(s)         Comments: Used cane on right side prior to surgery     Hand Dominance   Dominant Hand: Right    Extremity/Trunk Assessment   Upper Extremity Assessment Upper Extremity Assessment: Defer  to OT evaluation    Lower Extremity Assessment Lower Extremity Assessment: LLE deficits/detail LLE Deficits / Details: some functional weakness. Was able to isolate movements with major muscle groups. LLE Coordination: decreased gross motor    Cervical / Trunk Assessment Cervical / Trunk Assessment:  (not assessed)   Communication   Communication: No difficulties  Cognition Arousal/Alertness: Awake/alert Behavior During Therapy: WFL for tasks assessed/performed Overall Cognitive Status: Within Functional Limits for tasks assessed                                        General Comments General comments (skin integrity, edema, etc.): no family present    Exercises     Assessment/Plan    PT Assessment Patient needs continued PT services  PT Problem List Decreased strength;Decreased balance;Decreased mobility;Decreased knowledge of use of DME;Pain       PT Treatment Interventions DME instruction;Gait training;Stair training;Therapeutic exercise;Balance training;Patient/family education    PT Goals (Current goals can be found in the Care Plan section)  Acute Rehab PT Goals Patient Stated Goal: To be independent again PT Goal Formulation: With patient Time For Goal Achievement: 01/16/21 Potential to Achieve Goals: Good    Frequency Min 3X/week   Barriers to discharge        Co-evaluation               AM-PAC PT "6 Clicks" Mobility  Outcome Measure Help needed turning from your back to your side while in a flat bed without using bedrails?: A Little Help needed moving from lying on your back to sitting on the side of a flat bed without using bedrails?: A Little Help needed moving to and from a bed to a chair (including a wheelchair)?: A Little Help needed standing up from a chair using your arms (e.g., wheelchair or bedside chair)?: A Little Help needed to walk in hospital room?: A Little Help needed climbing 3-5 steps with a railing? : A Lot 6 Click Score: 17    End of Session Equipment Utilized During Treatment: Gait belt;Other (comment) (r shoulder sling) Activity Tolerance: Patient limited by pain Patient left: in chair;with chair alarm set;with call bell/phone within reach   PT Visit Diagnosis: Unsteadiness on feet (R26.81)    Time: 4270-6237 PT Time  Calculation (min) (ACUTE ONLY): 36 min   Charges:   PT Evaluation $PT Eval Moderate Complexity: 1 Mod PT Treatments $Gait Training: 8-22 mins        Lavonia Dana, PT   Acute Rehabilitation Services  Pager [phone] Office [phone] 01/09/2021   Joshua [name] 01/09/2021, 10:31 AM

## 2021-01-09 NOTE — Progress Notes (Signed)
Patient ID: Joshua [name], male   DOB: Dec 03, 1960, 60 y.o.   MRN: [id number] Patient is status post total shoulder arthroplasty.  Patient will have support for discharge on Monday.  Plan for discharge on Monday.

## 2021-01-09 NOTE — Progress Notes (Signed)
Patient complained of feeling hot and dizzy suddenly. BP 82/50 and heart rate 61 upon entering room. CBG was 120. Repeated BP and it was 92/62 with heart rate of 63. Encouraged patient to drink more fluids, turned down temperature in room, and provided a personal fan per patient request. Patient rating pain 8/10. Will monitor BP prior to administering pain medication. Patient verbalized understanding. Ice in place to right shoulder. Massie Bougie, RN

## 2021-01-09 NOTE — Progress Notes (Signed)
Anticipate dc tomorrow Rx done  Ok to use cane in right hand for balance

## 2021-01-09 NOTE — Plan of Care (Signed)
  Problem: Activity: Goal: Risk for activity intolerance will decrease Outcome: Progressing   Problem: Coping: Goal: Level of anxiety will decrease Outcome: Progressing   Problem: Pain Managment: Goal: General experience of comfort will improve Outcome: Progressing   Problem: Safety: Goal: Ability to remain free from injury will improve Outcome: Progressing   Problem: Skin Integrity: Goal: Risk for impaired skin integrity will decrease Outcome: Progressing   Problem: Activity: Goal: Ability to tolerate increased activity will improve Outcome: Progressing   Problem: Pain Management: Goal: Pain level will decrease with appropriate interventions Outcome: Progressing

## 2021-01-09 NOTE — Progress Notes (Signed)
Occupational Therapy Treatment Patient Details Name: Joshua [name] MRN: [id number] DOB: Mar 20, 1961 Today's Date: 01/09/2021    History of present illness 60 y.o. M admitted to [facility] on 6/24 for s/p Right Reverse Total Shoulder Replacement. Pt's PMH is significant for Brain tumor, Meningioma's w/ resection, GERD, Anxiety, L foot drop, HTN, Neuropathy, and seizures.   OT comments  Joshua [name] is progressing well. Pt tolerated elbow, wrist and hand exercises well with HEP given. He demonstrated good understanding of sling wear and care, with HEP given. And pt verbalized understanding of sleeping positions, compensatory techniques for bathing and dressing RUE with shoulder d/c sheet given. Pt required min A for bed mobility and boosting in bed. Pt continues to benefit from OT acutely to progress rehab and function of his RUE. D/c plan remains appropriate.    Follow Up Recommendations  Follow surgeon's recommendation for DC plan and follow-up therapies;Supervision - Intermittent    Equipment Recommendations  Tub/shower seat       Precautions / Restrictions Precautions Precautions: Shoulder;Fall Type of Shoulder Precautions: Total Shoulder Shoulder Interventions: Shoulder sling/immobilizer;At all times;Off for dressing/bathing/exercises Required Braces or Orthoses: Sling Restrictions Weight Bearing Restrictions: Yes RUE Weight Bearing: Non weight bearing       Mobility Bed Mobility Overal bed mobility: Needs Assistance Bed Mobility: Supine to Sit     Supine to sit: Min assist;HOB elevated     General bed mobility comments: Assistance with LEs and trunk required    Transfers Overall transfer level: Needs assistance               General transfer comment: session completed EOB    Balance Overall balance assessment: Needs assistance             ADL either performed or assessed with clinical judgement   ADL Overall ADL's : Needs assistance/impaired         Upper  Body Bathing: Minimal assistance Upper Body Bathing Details (indicate cue type and reason): verbally reviewed compensatory techniques for bathing RUE     Upper Body Dressing : Moderate assistance Upper Body Dressing Details (indicate cue type and reason): reviewed compensatory techniques for RUE dressing and sling don/doff                   General ADL Comments: session focused on RUE exercises, sling wear and care and compendatory techniques for RUE management during ADLs      Cognition Arousal/Alertness: Awake/alert Behavior During Therapy: WFL for tasks assessed/performed Overall Cognitive Status: Within Functional Limits for tasks assessed          Exercises Shoulder Exercises Elbow Flexion: AROM;Right;10 reps;Seated Elbow Extension: AROM;Right;10 reps;Seated Wrist Flexion: AROM;Right;10 reps;Seated Wrist Extension: AROM;Right;10 reps;Seated Digit Composite Flexion: AROM;Right;10 reps;Seated   Shoulder Instructions Shoulder Instructions Donning/doffing shirt without moving shoulder: Minimal assistance Method for sponge bathing under operated UE: Minimal assistance Donning/doffing sling/immobilizer: Moderate assistance Correct positioning of sling/immobilizer: Minimal assistance ROM for elbow, wrist and digits of operated UE: Supervision/safety Sling wearing schedule (on at all times/off for ADL's): Supervision/safety Proper positioning of operated UE when showering: Minimal assistance Positioning of UE while sleeping: Minimal assistance     General Comments no new concerns    Pertinent Vitals/ Pain       Pain Assessment: Faces Faces Pain Scale: Hurts whole lot Pain Location: R shoulder Pain Descriptors / Indicators: Aching;Burning;Discomfort;Grimacing;Guarding Pain Intervention(s): Limited activity within patient's tolerance;Monitored during session  Home Living Family/patient expects to be discharged to:: Private residence Living Arrangements:  Non-relatives/Friends  Frequency  Min 2X/week        Progress Toward Goals  OT Goals(current goals can now be found in the care plan section)  Progress towards OT goals: Progressing toward goals  Acute Rehab OT Goals Patient Stated Goal: To be independent again OT Goal Formulation: With patient Time For Goal Achievement: 01/22/21 Potential to Achieve Goals: Good ADL Goals Pt Will Perform Upper Body Bathing: with modified independence;with adaptive equipment;sitting Pt Will Perform Upper Body Dressing: with modified independence;sitting Pt Will Transfer to Toilet: with modified independence;ambulating Pt Will Perform Toileting - Clothing Manipulation and hygiene: with modified independence;sitting/lateral leans;sit to/from stand Pt/caregiver will Perform Home Exercise Program: Increased ROM;Increased strength;Both right and left upper extremity;With written HEP provided  Plan Discharge plan remains appropriate    Co-evaluation                 AM-PAC OT "6 Clicks" Daily Activity     Outcome Measure   Help from another person eating meals?: A Little Help from another person taking care of personal grooming?: A Little Help from another person toileting, which includes using toliet, bedpan, or urinal?: A Lot Help from another person bathing (including washing, rinsing, drying)?: A Lot Help from another person to put on and taking off regular upper body clothing?: A Little Help from another person to put on and taking off regular lower body clothing?: A Lot 6 Click Score: 15    End of Session    OT Visit Diagnosis: Unsteadiness on feet (R26.81);Other abnormalities of gait and mobility (R26.89);Muscle weakness (generalized) (M62.81)   Activity Tolerance Patient tolerated treatment well   Patient Left in bed;with call bell/phone within reach;with bed alarm set   Nurse Communication Mobility status        Time: 1533-1600 OT Time Calculation (min):  27 min  Charges: OT General Charges $OT Visit: 1 Visit OT Treatments $Therapeutic Activity: 8-22 mins $Therapeutic Exercise: 23-37 mins     Tekisha Darcey A Jazmen Lindenbaum 01/09/2021, 4:04 PM

## 2021-01-10 DIAGNOSIS — N400 Enlarged prostate without lower urinary tract symptoms: Secondary | ICD-10-CM | POA: Diagnosis present

## 2021-01-10 DIAGNOSIS — R531 Weakness: Secondary | ICD-10-CM | POA: Diagnosis not present

## 2021-01-10 DIAGNOSIS — F419 Anxiety disorder, unspecified: Secondary | ICD-10-CM | POA: Diagnosis present

## 2021-01-10 DIAGNOSIS — E1140 Type 2 diabetes mellitus with diabetic neuropathy, unspecified: Secondary | ICD-10-CM | POA: Diagnosis present

## 2021-01-10 DIAGNOSIS — M19011 Primary osteoarthritis, right shoulder: Secondary | ICD-10-CM

## 2021-01-10 DIAGNOSIS — Z833 Family history of diabetes mellitus: Secondary | ICD-10-CM | POA: Diagnosis not present

## 2021-01-10 DIAGNOSIS — Z87891 Personal history of nicotine dependence: Secondary | ICD-10-CM | POA: Diagnosis not present

## 2021-01-10 DIAGNOSIS — Z8249 Family history of ischemic heart disease and other diseases of the circulatory system: Secondary | ICD-10-CM | POA: Diagnosis not present

## 2021-01-10 DIAGNOSIS — Z7982 Long term (current) use of aspirin: Secondary | ICD-10-CM | POA: Diagnosis not present

## 2021-01-10 DIAGNOSIS — M75101 Unspecified rotator cuff tear or rupture of right shoulder, not specified as traumatic: Secondary | ICD-10-CM | POA: Diagnosis present

## 2021-01-10 DIAGNOSIS — Z79899 Other long term (current) drug therapy: Secondary | ICD-10-CM | POA: Diagnosis not present

## 2021-01-10 DIAGNOSIS — K08409 Partial loss of teeth, unspecified cause, unspecified class: Secondary | ICD-10-CM | POA: Diagnosis present

## 2021-01-10 DIAGNOSIS — Z86011 Personal history of benign neoplasm of the brain: Secondary | ICD-10-CM | POA: Diagnosis not present

## 2021-01-10 DIAGNOSIS — I10 Essential (primary) hypertension: Secondary | ICD-10-CM | POA: Diagnosis present

## 2021-01-10 DIAGNOSIS — E7800 Pure hypercholesterolemia, unspecified: Secondary | ICD-10-CM | POA: Diagnosis present

## 2021-01-10 DIAGNOSIS — K219 Gastro-esophageal reflux disease without esophagitis: Secondary | ICD-10-CM | POA: Diagnosis present

## 2021-01-10 LAB — GLUCOSE, CAPILLARY
Glucose-Capillary: 116 mg/dL — ABNORMAL HIGH (ref 70–99)
Glucose-Capillary: 144 mg/dL — ABNORMAL HIGH (ref 70–99)
Glucose-Capillary: 126 mg/dL — ABNORMAL HIGH (ref 70–99)
Glucose-Capillary: 138 mg/dL — ABNORMAL HIGH (ref 70–99)

## 2021-01-10 NOTE — Progress Notes (Signed)
Occupational Therapy Treatment Patient Details Name: Joshua [name] MRN: [id number] DOB: Dec 11, 1960 Today's Date: 01/10/2021    History of present illness 60 y.o. M admitted to [facility] on 6/24 for s/p Right Reverse Total Shoulder Replacement. Pt's PMH is significant for Brain tumor, Meningioma's w/ resection, GERD, Anxiety, L foot drop, HTN, Neuropathy, and seizures.   OT comments  Joshua [name] continues to progress well, he reports improved pain today but is currently tired but agreeable to participate in therapy. Pt completed all RUE exercises with supervision and vc with good tolerance. He verbalized great understanding or compensatory techniques for upper body ADLs. Pt is min guard for sit<>stand and short ambulation with L crutch, he reported that he feels as if he is very close to his baseline ambulation. Pt continues to benefit from continued OT acutely. D/c recommendation remains appropriate.    Follow Up Recommendations  Follow surgeon's recommendation for DC plan and follow-up therapies;Supervision - Intermittent    Equipment Recommendations  Tub/shower seat       Precautions / Restrictions Precautions Precautions: Shoulder;Fall Type of Shoulder Precautions: Total Shoulder Shoulder Interventions: Shoulder sling/immobilizer;For comfort ("for comfort & for sleep") Required Braces or Orthoses: Sling Restrictions Weight Bearing Restrictions: Yes RUE Weight Bearing: Non weight bearing       Mobility Bed Mobility Overal bed mobility: Needs Assistance Bed Mobility: Supine to Sit     Supine to sit: Supervision;HOB elevated     General bed mobility comments: HOB elevated, vc to no weight bear through RUE    Transfers Overall transfer level: Needs assistance Equipment used: Crutches (L crutch) Transfers: Sit to/from Stand Sit to Stand: Min guard         General transfer comment: 2 attempts to power up, incrased time and close min guard for ambulation - pt reports he is  "pretty close" to his walking baseline    Balance Overall balance assessment: Needs assistance Sitting-balance support: No upper extremity supported;Feet supported Sitting balance-Leahy Scale: Fair     Standing balance support: Single extremity supported Standing balance-Leahy Scale: Poor         ADL either performed or assessed with clinical judgement   ADL Overall ADL's : Needs assistance/impaired         Toilet Transfer: Min guard (with L crutch) Toilet Transfer Details (indicate cue type and reason): simulated to chair           General ADL Comments: session focused on RUE exercises and compensatory techniques; pt reports he donned is t-shirt with no issues today with use of compensatory technique      Cognition Arousal/Alertness: Awake/alert Behavior During Therapy: WFL for tasks assessed/performed Overall Cognitive Status: Within Functional Limits for tasks assessed             General Comments: Pt concerned of his low BP        Exercises Exercises: Shoulder Shoulder Exercises Elbow Flexion: AROM;Right;10 reps;Seated Elbow Extension: AROM;Right;10 reps;Seated Wrist Flexion: AROM;Right;10 reps;Seated Wrist Extension: AROM;Right;10 reps;Seated Digit Composite Flexion: AROM;Right;10 reps;Seated   Shoulder Instructions Shoulder Instructions Donning/doffing shirt without moving shoulder: Min-guard Method for sponge bathing under operated UE: Min-guard Donning/doffing sling/immobilizer: Minimal assistance Correct positioning of sling/immobilizer: Modified independent ROM for elbow, wrist and digits of operated UE: Supervision/safety Sling wearing schedule (on at all times/off for ADL's): Supervision/safety Proper positioning of operated UE when showering: Supervision/safety     General Comments Pt concerned about his loe BP report, would like to keep a closer eye on BP    Pertinent Vitals/  Pain       Pain Assessment: Faces Pain Score: 6  Faces  Pain Scale: Hurts a little bit Pain Location: R shoulder Pain Descriptors / Indicators: Discomfort Pain Intervention(s): Limited activity within patient's tolerance;Monitored during session   Frequency  Min 2X/week        Progress Toward Goals  OT Goals(current goals can now be found in the care plan section)  Progress towards OT goals: Progressing toward goals  Acute Rehab OT Goals Patient Stated Goal: To be independent again OT Goal Formulation: With patient Time For Goal Achievement: 01/22/21 Potential to Achieve Goals: Good ADL Goals Pt Will Perform Upper Body Bathing: with modified independence;with adaptive equipment;sitting Pt Will Perform Upper Body Dressing: with modified independence;sitting Pt Will Transfer to Toilet: with modified independence;ambulating Pt Will Perform Toileting - Clothing Manipulation and hygiene: with modified independence;sitting/lateral leans;sit to/from stand Pt/caregiver will Perform Home Exercise Program: Increased ROM;Increased strength;Both right and left upper extremity;With written HEP provided  Plan Discharge plan remains appropriate       AM-PAC OT "6 Clicks" Daily Activity     Outcome Measure   Help from another person eating meals?: A Little Help from another person taking care of personal grooming?: A Little Help from another person toileting, which includes using toliet, bedpan, or urinal?: A Lot Help from another person bathing (including washing, rinsing, drying)?: A Lot Help from another person to put on and taking off regular upper body clothing?: A Little Help from another person to put on and taking off regular lower body clothing?: A Lot 6 Click Score: 15    End of Session Equipment Utilized During Treatment: Other (comment) (L crutch)  OT Visit Diagnosis: Unsteadiness on feet (R26.81);Other abnormalities of gait and mobility (R26.89);Muscle weakness (generalized) (M62.81)   Activity Tolerance Patient tolerated  treatment well   Patient Left in chair;with call bell/phone within reach;with chair alarm set   Nurse Communication Mobility status        Time: 4656-8127 OT Time Calculation (min): 17 min  Charges: OT General Charges $OT Visit: 1 Visit OT Treatments $Therapeutic Exercise: 8-22 mins     Khaliq Turay A Mccabe Gloria 01/10/2021, 3:51 PM

## 2021-01-10 NOTE — Progress Notes (Signed)
Physical Therapy Treatment Patient Details Name: Joshua [name] MRN: [id number] DOB: 1961/06/30 Today's Date: 01/10/2021    History of Present Illness 60 y.o. M admitted to [facility] on 6/24 for s/p Right Reverse Total Shoulder Replacement. Pt's PMH is significant for Brain tumor, Meningioma's w/ resection, GERD, Anxiety, L foot drop, HTN, Neuropathy, and seizures.    PT Comments    Pt significantly better with mobility today.  He does have some catching with his left foot with gait which increases his chances of falling and that was present before surgery. Pt reports he had talked with someone about gettimg more PT to help improve LLE strength and mobility.     Follow Up Recommendations  Home health PT;Outpatient PT (Outpt PT would be best once able.)     Equipment Recommendations  Crutches    Recommendations for Other Services       Precautions / Restrictions Precautions Precautions: Shoulder;Fall Type of Shoulder Precautions: Total Shoulder Shoulder Interventions: Shoulder sling/immobilizer;At all times;Off for dressing/bathing/exercises Required Braces or Orthoses: Sling Restrictions Weight Bearing Restrictions: Yes RUE Weight Bearing: Non weight bearing    Mobility  Bed Mobility Overal bed mobility: Needs Assistance Bed Mobility: Supine to Sit     Supine to sit: Supervision;HOB elevated (Used rail)     General bed mobility comments: much improved today and likely would require assist in a regular bed    Transfers Overall transfer level: Needs assistance Equipment used: None Transfers: Sit to/from Stand Sit to Stand: Min guard         General transfer comment: much improved with powering up to stand  Ambulation/Gait Ambulation/Gait assistance: Min assist Gait Distance (Feet): 60 Feet Assistive device: Crutches Gait Pattern/deviations: Step-to pattern;Decreased stride length Gait velocity: improved from yesterday but not at age adjusted norm.   General  Gait Details: much improved with gait today.  Ambulated partway with crutch in l hand and partway without device.  Was more stable with crutch.  L foot occassional did not clear floor. Used tennis shoes today   Stairs Stairs: Yes Stairs assistance: Min assist Stair Management: One rail Left;Step to pattern;Forwards Number of Stairs: 2     Wheelchair Mobility    Modified Rankin (Stroke Patients Only)       Balance                                            Cognition Arousal/Alertness: Awake/alert Behavior During Therapy: WFL for tasks assessed/performed Overall Cognitive Status: Within Functional Limits for tasks assessed                                        Exercises      General Comments General comments (skin integrity, edema, etc.): Pt l=pleased with his progress. No visitors present today      Pertinent Vitals/Pain Pain Assessment: 0-10 Pain Score: 6  Pain Location: R shoulder Pain Descriptors / Indicators: Discomfort Pain Intervention(s): Limited activity within patient's tolerance;Premedicated before session    Home Living                      Prior Function            PT Goals (current goals can now be found in the care plan section) Progress towards PT  goals: Progressing toward goals    Frequency           PT Plan Current plan remains appropriate    Co-evaluation              AM-PAC PT "6 Clicks" Mobility   Outcome Measure  Help needed turning from your back to your side while in a flat bed without using bedrails?: A Little Help needed moving from lying on your back to sitting on the side of a flat bed without using bedrails?: A Little Help needed moving to and from a bed to a chair (including a wheelchair)?: A Little Help needed standing up from a chair using your arms (e.g., wheelchair or bedside chair)?: A Little Help needed to walk in hospital room?: A Little Help needed climbing 3-5  steps with a railing? : A Lot 6 Click Score: 17    End of Session Equipment Utilized During Treatment: Gait belt;Other (comment) (shoulder sling for RUE) Activity Tolerance: Patient tolerated treatment well Patient left: in bed;with call bell/phone within reach Nurse Communication: Mobility status;Other (comment) (bed alarm not on as it was not in use when PT arrived)       Time: 2446-9507 PT Time Calculation (min) (ACUTE ONLY): 23 min  Charges:  $Gait Training: 8-22 mins                     Lavonia Dana, PT   Acute Rehabilitation Services  Pager [phone] Office [phone] 01/10/2021    Melvern Banker 01/10/2021, 1:42 PM

## 2021-01-10 NOTE — Progress Notes (Signed)
Patient ID: Joshua [name], male   DOB: 1961-06-08, 60 y.o.   MRN: [id number] Patient is status post reverse total shoulder arthroplasty.  He states he continues to show improvement.  Anticipate discharge to home tomorrow.

## 2021-01-10 NOTE — TOC Initial Note (Signed)
Transition of Care [facility]) - Initial/Assessment Note    Patient Details  Name: Joshua [name] MRN: [id number] Date of Birth: [birth year]  [facility] CM/SW Contact:    Bartholomew Crews, RN Phone Number: [phone] 01/10/2021, 5:50 PM  Clinical Narrative:                  Spoke with patient at the bedside to discuss transition plans once medically stable.   Unable to find [facility] PT/OT. Patient agreeable to outpatient PT and OT. Referral placed.   Discussed recommendation for tub transfer bench. Patient agreeable.   TOC following for transition needs.   Expected Discharge Plan: OP Rehab Barriers to Discharge: Continued Medical Work up   Patient Goals and CMS Choice Patient states their goals for this hospitalization and ongoing recovery are:: return home when medically stable CMS Medicare.gov Compare Post Acute Care list provided to:: Patient Choice offered to / list presented to : Patient  Expected Discharge Plan and Services Expected Discharge Plan: OP Rehab In-house Referral: Clinical Social Work Discharge Planning Services: CM Consult   Living arrangements for the past 2 months: Apartment Expected Discharge Date: 01/10/21                         HH Arranged: NA [place]: NA        Prior Living Arrangements/Services Living arrangements for the past 2 months: Apartment Lives with:: Self Patient language and need for interpreter reviewed:: Yes Do you feel safe going back to the place where you live?: Yes      Need for Family Participation in Patient Care: Yes (Comment) Care giver support system in place?: Yes (comment)   Criminal Activity/Legal Involvement Pertinent to Current Situation/Hospitalization: No - Comment as needed  Activities of Daily Living Home Assistive Devices/Equipment: Crutches, Cane (specify quad or straight), Blood pressure cuff, CBG Meter, Dentures (specify type) ADL Screening (condition at time of admission) Patient's cognitive  ability adequate to safely complete daily activities?: Yes Is the patient deaf or have difficulty hearing?: No Does the patient have difficulty seeing, even when wearing glasses/contacts?: No Does the patient have difficulty concentrating, remembering, or making decisions?: No Patient able to express need for assistance with ADLs?: Yes Does the patient have difficulty dressing or bathing?: Yes Independently performs ADLs?: No Communication: Independent Dressing (OT): Needs assistance Is this a change from baseline?: Change from baseline, expected to last >3 days Grooming: Needs assistance Is this a change from baseline?: Change from baseline, expected to last >3 days Feeding: Independent Bathing: Needs assistance Is this a change from baseline?: Change from baseline, expected to last >3 days Toileting: Needs assistance Is this a change from baseline?: Change from baseline, expected to last >3days In/Out Bed: Needs assistance Is this a change from baseline?: Change from baseline, expected to last >3 days Walks in Home: Independent Does the patient have difficulty walking or climbing stairs?: No Weakness of Legs: None Weakness of Arms/Hands: Right  Permission Sought/Granted                  Emotional Assessment Appearance:: Appears stated age Attitude/Demeanor/Rapport: Engaged Affect (typically observed): Accepting Orientation: : Oriented to Self, Oriented to  Time, Oriented to Place, Oriented to Situation Alcohol / Substance Use: Not Applicable Psych Involvement: No (comment)  Admission diagnosis:  S/P reverse total shoulder arthroplasty, right [Z96.611] Patient Active Problem List   Diagnosis Date Noted   Arthritis of right shoulder region  S/P reverse total shoulder arthroplasty, 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   Essential hypertension 07/02/2020   Prediabetes 07/02/2020   Polyuria 07/02/2020   Nontraumatic complete tear of right rotator cuff    Impingement syndrome of right  shoulder    Neuropathy 12/05/2019   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[place]) 03/07/2019   Seizures ([place]) 01/31/2019   Meningioma, recurrent of brain ([place]) 01/31/2019   Meningioma ([place]) 01/03/2019   Diffuse idiopathic skeletal hyperostosis 06/05/2013   PCP:  Charlott Rakes, MD Pharmacy:   [id number] - [place], [place] - [address] [place] [address] Phone: [phone] Fax: [place], [place] - [address] [place] [address] Phone: [phone] Fax: [phone]  [id number] - [place], [place] [address] [place] [address] Phone: [phone] Fax: [phone]     Social Determinants of Health (SDOH) Interventions    Readmission Risk Interventions No flowsheet data found.

## 2021-01-11 ENCOUNTER — Telehealth: Payer: Self-pay | Admitting: Family Medicine

## 2021-01-11 ENCOUNTER — Other Ambulatory Visit: Payer: Self-pay | Admitting: Family Medicine

## 2021-01-11 DIAGNOSIS — R531 Weakness: Secondary | ICD-10-CM | POA: Diagnosis not present

## 2021-01-11 DIAGNOSIS — I700 Atherosclerosis of aorta: Secondary | ICD-10-CM

## 2021-01-11 LAB — GLUCOSE, CAPILLARY
Glucose-Capillary: 117 mg/dL — ABNORMAL HIGH (ref 70–99)
Glucose-Capillary: 170 mg/dL — ABNORMAL HIGH (ref 70–99)
Glucose-Capillary: 130 mg/dL — ABNORMAL HIGH (ref 70–99)

## 2021-01-11 MED ORDER — POLYETHYLENE GLYCOL 3350 17 G PO PACK
17.0000 g | PACK | Freq: Every day | ORAL | Status: DC
  Administered 2021-01-11: 17 g via ORAL
  Filled 2021-01-11: qty 1

## 2021-01-11 NOTE — Progress Notes (Signed)
  Subjective: Joshua [name] is a 60 y.o. male s/p right RSA.  They are POD3.  Pt's pain is controlled.  Moving his arm well.  Block has worn off.  Only complaint is he has not had a BM yet.  .    Objective: Vital signs in last 24 hours: Temp:  [98.3 F (36.8 C)-98.8 F (37.1 C)] 98.6 F (37 C) (06/27 0804) Pulse Rate:  [80-91] 91 (06/27 0804) Resp:  [16] 16 (06/27 0804) BP: (108-118)/(65-85) 118/85 (06/27 0804) SpO2:  [98 %-100 %] 98 % (06/27 0804)  Intake/Output from previous day: No intake/output data recorded. Intake/Output this shift: No intake/output data recorded.  Exam:  No gross blood or drainage overlying the dressing 2+ radial pulse Sensation intact distally in the right hand Able to extend the right wrist Axillary nerve intact   Labs: No results for input(s): HGB in the last 72 hours. No results for input(s): WBC, RBC, HCT, PLT in the last 72 hours. No results for input(s): NA, K, CL, CO2, BUN, CREATININE, GLUCOSE, CALCIUM in the last 72 hours. No results for input(s): LABPT, INR in the last 72 hours.  Assessment/Plan: Pt is POD3 s/p right RSA    -Plan to discharge to home today pending patients BM.  Trying miralax this AM and will consider mag citrate if no BM on that.  Discharge home after BM.  -No lifting with the operative arm     Landry Lookingbill L Zamyia Gowell 01/11/2021, 11:14 AM

## 2021-01-11 NOTE — Op Note (Signed)
NAME: Labuda, NISSAN FRAZZINI MEDICAL RECORD NO: [id number] ACCOUNT NO: [id number] DATE OF BIRTH: 06-06-1961 FACILITY: [facility] LOCATION: [facility] PHYSICIAN: Yetta Barre. 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  PROCEDURE:  Right shoulder reverse shoulder replacement using [employer] components primary shoulder stem micro 14 x 55 with mini humeral tray, +3 taper offset 40 mm, standard 36 mm bearing, 36 glenosphere +3 offset with small augmented baseplate, 1 central  compression screw, 4 peripheral locking screws.  SURGEON:  Meredith Pel, MD  ASSISTANT:  Annie Main, PA  INDICATIONS:  The patient is a 60 year old patient with end-stage right shoulder rotator cuff arthropathy, pain, and arthritis, who presents for operative management after failure of conservative measures and explanation of risks and benefits.  DESCRIPTION OF PROCEDURE:  The patient was brought to the operating room where general anesthetic was induced.  Preoperative antibiotics were administered.  Timeout was called.  The patient was placed in the beach chair position with his head in neutral  position.  Preoperative range of motion on the right passively was 60/100/170.  With the head in neutral position, the right shoulder, arm, and hand prescrubbed with hydrogen peroxide, alcohol, and Betadine, which was allowed to dry, then prepped with  ChloraPrep solution and draped in a sterile manner.  Charlie Pitter was used to cover the operative field.  Timeout was called.  Deltopectoral approach was made.  Skin and subcutaneous tissue sharply divided.  Cephalic vein mobilized laterally.  Crossing branches  cauterized.  Subdeltoid adhesions were released manually.  Deltoid elevated anterior attachment off the distal anterior part of the humerus.  Pec was released about  1.5 cm.  At this time, the patient had full-thickness rotator cuff tears infraspinatus  and supraspinatus.  Subscapularis had some tendinosis, but was generally intact.  Circumflex vessels were ligated.  Axillary nerve was visualized and palpated and protected at all times during the case.  Next, the subscap was detached along with the  capsule from the lesser tuberosity down around the inferior neck to the 5 o'clock position including about 2 cm below the inferior humeral neck.  The biceps tendon was tenodesed to the pec tendon.  Autotenodesis had already occurred to a large extent in  the intertubercular groove.  Head was dislocated.  Reaming was performed up to a size 16.  Head was then cut in 30 degrees of retroversion.  Broaching performed up to a size 14, which gave a very nice fit.  Cap was placed.  Posterior retractors were  placed.  Full capsulotomy was performed with care being taken not to injure the axillary nerve.  The glenoid was exposed with an anterior and posterior retractor.  Patient-specific instrumentation was placed and good alignment was achieved.  Reaming was  performed.  Reaming for the augment was also performed.  Excellent contact achieved with minimal glenoid bone loss.  Baseplate was placed with 1 central compression screw and 4 peripheral locking screws.  A trial reduction was then performed, and a +3  glenosphere was then placed to increase offset.  At this time, trial stem was removed and the true 14 mm stem was placed after drilling holes for tape x5 for the subscap repair.  The best stability without excessive tension was the mini humeral tray with  +3 offset and standard bearing.  This gave excellent stability with extension, forward flexion, and external  rotation.  True component placed with same stability parameters maintained.  Axillary nerve palpated and intact.  Subscap was then repaired with  the arm in 30 degrees of external rotation to the lesser tuberosity.   IrriSept was placed both in the humeral canal prior to placement of the implant along with vancomycin powder.  IrriSept solution then used in the joint along with vancomycin powder,  and then the deltopectoral interval was closed using #1 Vicryl suture followed by interrupted inverted 0 Vicryl suture, 2-0 Vicryl suture, and 3-0 Monocryl.  Steri-Strips, Aquacel dressing along with shoulder immobilizer were placed.  Luke's assistance was required at all times during the case for retraction, mobilization of tissues, opening, and closing.  His assistance was a medical necessity.   [facility] D: 01/08/2021 11:02:24 am T: 01/08/2021 4:15:00 pm  JOB: [id number]/ [id number]

## 2021-01-11 NOTE — Telephone Encounter (Signed)
Copied from [place] [id number]. Topic: General - Other >> Jan 11, 2021  9:02 AM Tessa Lerner A wrote: Reason for CRM: Patient has made contact requesting for their PCP to submit orders/ request for extended physical rehabilitation focused on their legs  Patient shares that their PCP can contact the physical therapist at [facility]   Please contact to further advise if needed

## 2021-01-11 NOTE — Plan of Care (Signed)
  Problem: Activity: Goal: Risk for activity intolerance will decrease Outcome: Progressing   Problem: Coping: Goal: Level of anxiety will decrease Outcome: Progressing   Problem: Pain Managment: Goal: General experience of comfort will improve Outcome: Progressing   Problem: Safety: Goal: Ability to remain free from injury will improve Outcome: Progressing   Problem: Skin Integrity: Goal: Risk for impaired skin integrity will decrease Outcome: Progressing   Problem: Activity: Goal: Ability to tolerate increased activity will improve Outcome: Progressing   Problem: Activity: Goal: Ability to tolerate increased activity will improve Outcome: Progressing   Problem: Pain Management: Goal: Pain level will decrease with appropriate interventions Outcome: Progressing

## 2021-01-11 NOTE — Plan of Care (Addendum)
Patient was able to have a BM, patient education on discharge orders, patient currently waiting on ride. Patient educated on ice man.   Patient DC home with sister @1810  with equipment, personal belongings, and educational packet. IV removed, patient VS stable.    Problem: Education: Goal: Knowledge of General Education information will improve Description: Including pain rating scale, medication(s)/side effects and non-pharmacologic comfort measures Outcome: Progressing   Problem: Activity: Goal: Risk for activity intolerance will decrease Outcome: Progressing   Problem: Pain Managment: Goal: General experience of comfort will improve Outcome: Progressing   Problem: Safety: Goal: Ability to remain free from injury will improve Outcome: Progressing   Problem: Skin Integrity: Goal: Risk for impaired skin integrity will decrease Outcome: Progressing

## 2021-01-12 ENCOUNTER — Encounter (HOSPITAL_COMMUNITY): Payer: Self-pay | Admitting: Orthopedic Surgery

## 2021-01-12 ENCOUNTER — Telehealth: Payer: Self-pay

## 2021-01-12 NOTE — Telephone Encounter (Signed)
Patient returning your call.

## 2021-01-12 NOTE — Telephone Encounter (Signed)
Transition Care Management Unsuccessful Follow-up Telephone Call  Date of discharge and from where:  01/11/2021, [facility]  Attempts:  1st Attempt  Reason for unsuccessful TCM follow-up call:  Left voice message on # [phone]

## 2021-01-13 ENCOUNTER — Telehealth: Payer: Self-pay

## 2021-01-13 NOTE — Telephone Encounter (Signed)
From the discharge call:  He said he is sore and trying to figure out how to use the CPM machine.  Instructed him to call the company that delivered it and ask that they come to demonstrate how to use it.  He has a glucometer but is not sure the readings are correct. Someone told him that it needs to be calibrated. Explained to him that he can bring it to this clinic and the pharmacist can check it or he can ask his local pharmacist to check it.  The referrals for outpatient PT/OT were made for [address]  The patient is living in [place] and is wondering if there are any clinics closer to where he is staying. Instructed him to call the clinic on [address] and inquire if they can transfer the referral to another clinic.  The number for the [place] clinic is on his AVS.  He said that he  has all medications and did not have any questions about his med regime.  He has been ambulating with a crutch.  He has a cane but that has been too difficult to use with the limitations on his RUE.  He has been using a sling for his RUE when he is up. The surgical dressing remains intact  He will call [facility] to schedule an appointment after he sees Dr Marlou Sa and schedules his outpatient therapy appointments.   He sees Dr Marlou Sa 01/22/2021.

## 2021-01-13 NOTE — Telephone Encounter (Signed)
Transition Care Management Follow-up Telephone Call Date of discharge and from where: 01/11/2021, [facility] How have you been since you were released from the hospital? He said he is sore and trying to figure out how to use the CPM machine.  Instructed him to call the company that delivered it and ask that they come to demonstrate how to use it.  Any questions or concerns? Yes-  He has a glucometer but is not sure the readings are correct. Someone told him that it needs to be calibrated. Explained to him that he can bring it to this clinic and the pharmacist can check it or he can ask his local pharmacist to check it.  The referrals for outpatient PT/OT were made for [address]  The patient is living in [place] and is wondering if there are any clinics closer to where he is staying. Instructed him to call the clinic on [address] and inquire if they can transfer the referral to another clinic.  The number for the [place] clinic is on his AVS.    Items Reviewed: Did the pt receive and understand the discharge instructions provided? Yes  Medications obtained and verified? Yes - he said that he  has all medications and did not have any questions about his med regime.  Other? No  Any new allergies since your discharge? No   Home Care and Equipment/Supplies: Were home health services ordered? no If so, what is the name of the agency? N/a  Has the agency set up a time to come to the patient's home? not applicable Were any new equipment or medical supplies ordered?  Yes: CPM  What is the name of the medical supply agency? Patient was not sure of the company but will check for a sticker on the equipment or paperwork that came with it.  Were you able to get the supplies/equipment? yes Do you have any questions related to the use of the equipment or supplies? Yes: he needs to contact the company for instructions how to use the machine  Functional Questionnaire: (I = Independent and D =  Dependent) ADLs: He has been ambulating with a crutch.  He has a cane but that has been too difficult to use with the limitations on his RUE.  He has been using a sling for his RUE when he is up. The surgical dressing remains intact    Follow up appointments reviewed:  PCP Hospital f/u appt confirmed?  He will call the clinic to schedule an appointment after he sees Dr Marlou Sa and schedules his outpatient therapy appointments.    [facility] f/u appt confirmed? Yes  Scheduled to see Dr Marlou Sa  on 01/22/2021   Are transportation arrangements needed? No  If their condition worsens, is the pt aware to call PCP or go to the Emergency Dept.? Yes Was the patient provided with contact information for the PCP's office or ED? Yes Was to pt encouraged to call back with questions or concerns? Yes

## 2021-01-22 ENCOUNTER — Other Ambulatory Visit: Payer: Self-pay | Admitting: Internal Medicine

## 2021-01-22 ENCOUNTER — Ambulatory Visit (INDEPENDENT_AMBULATORY_CARE_PROVIDER_SITE_OTHER): Payer: Medicaid Other

## 2021-01-22 ENCOUNTER — Ambulatory Visit (INDEPENDENT_AMBULATORY_CARE_PROVIDER_SITE_OTHER): Payer: Medicaid Other | Admitting: Orthopedic Surgery

## 2021-01-22 DIAGNOSIS — Z96611 Presence of right artificial shoulder joint: Secondary | ICD-10-CM | POA: Diagnosis not present

## 2021-01-22 DIAGNOSIS — C700 Malignant neoplasm of cerebral meninges: Secondary | ICD-10-CM

## 2021-01-22 DIAGNOSIS — R569 Unspecified convulsions: Secondary | ICD-10-CM

## 2021-01-23 ENCOUNTER — Encounter: Payer: Self-pay | Admitting: Orthopedic Surgery

## 2021-01-23 NOTE — Progress Notes (Signed)
Post-Op Visit Note   Patient: Joshua [name]           Date of Birth: 09/30/1960           MRN: [id number] Visit Date: 01/22/2021 PCP: Charlott Rakes, MD   Assessment & Plan:  Chief Complaint:  Chief Complaint  Patient presents with   Right Shoulder - Routine Post Op    01/08/21 Right RSA   Visit Diagnoses:  1. History of arthroplasty of right shoulder     Plan: Patient is a 60 year old male who presents s/p right reverse shoulder arthroplasty on 01/08/2021.  He reports that he is doing well and his shoulder pain is well controlled.  He has only had to take pain medication twice this week.  He is not using his sling.  Denies any fevers, chills, night sweats, chest pain, shortness of breath.  He has been using the CPM machine as instructed and is up to 70 degrees.  Denies any major complaints.  On exam he has 30 degrees external rotation, 75 degrees abduction, 60 degrees forward flexion.  Axillary nerve intact with deltoid firing.  Incision is healing well with no evidence of infection or dehiscence.  Steri-Strips reapplied today.  Excellent subscapularis strength on exam today.  Radiographs of the right shoulder show reverse shoulder prosthesis in excellent position and alignment without any complicating features.  Plan to start physical therapy for the right shoulder at [facility] regional which would be most convenient for the patient.  He is okay for working on passive and active range of motion as well as deltoid isometrics but no stressing of patient's external rotation past 30 degrees until 6 weeks out from procedure and no internal rotation strengthening until 6 weeks out.  Follow-up in 4 weeks for clinical recheck with Dr. Marlou Sa.  Follow-Up Instructions: No follow-ups on file.   Orders:  Orders Placed This Encounter  Procedures   XR Shoulder Right   Ambulatory referral to Physical Therapy   No orders of the defined types were placed in this encounter.   Imaging: No results  found.  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[place]) 03/07/2019   Meningioma ([facility])    Meningioma, recurrent of brain ([place]) 01/31/2019   Neuropathy 12/05/2019   Pre-diabetes    Rotator cuff tear    right   Seizures ([place])    11/27/19 last sz 1 wk ago    Family History  Problem Relation Age of Onset   Hypertension Mother    Dementia Mother  Diabetes Mother    Hypertension Father    CAD Father    Diabetes Father    CAD Brother     Past Surgical History:  Procedure Laterality Date   APPLICATION OF CRANIAL NAVIGATION N/A 01/10/2019   Procedure: APPLICATION OF CRANIAL NAVIGATION;  Surgeon: Erline Levine, MD;  Location: [facility];  Service: Neurosurgery;  Laterality: N/A;   CATARACT EXTRACTION Right    CRANIOTOMY Right 11/06/2012   Procedure: CRANIOTOMY TUMOR EXCISION;  Surgeon: Erline Levine, MD;  Location: [facility];  Service: Neurosurgery;  Laterality: Right;  Right Parasagittal craniotomy for meningioma with Stealth   CRANIOTOMY Right 01/10/2019   Procedure:  Right Parasagittal Craniotomy for Tumor;  Surgeon: Erline Levine, MD;  Location: [facility];  Service: Neurosurgery;  Laterality: Right;  Right parasagittal craniotomy for tumor   ESOPHAGOGASTRODUODENOSCOPY     JOINT REPLACEMENT Right 2018   right hip   REVERSE SHOULDER ARTHROPLASTY Right 01/08/2021   Procedure: RIGHT REVERSE SHOULDER ARTHROPLASTY;  Surgeon: Meredith Pel, MD;  Location: [facility];  Service: Orthopedics;  Laterality: Right;   right knee arthroscopy     SHOULDER ARTHROSCOPY Right 06/09/2020   Procedure: RIGHT SHOULDER ARTHROSCOPY AND DEBRIDEMENT;  Surgeon: Newt Minion, MD;  Location: [facility];  Service: Orthopedics;  Laterality: Right;   Social History   Occupational History   Not on file  Tobacco Use   Smoking status: Former    Pack years: 0.00    Types: Cigarettes    Quit date: 01/29/2019    Years since quitting: 1.9   Smokeless tobacco: Never   Tobacco comments:    weekend smoker  Vaping Use   Vaping Use: Never used  Substance and Sexual Activity   Alcohol use: Not Currently    Comment: social   Drug use: No   Sexual activity: Not Currently

## 2021-01-25 ENCOUNTER — Telehealth: Payer: Self-pay | Admitting: Orthopedic Surgery

## 2021-01-25 NOTE — Telephone Encounter (Signed)
Pt called requesting a call back concerning a prescription for OT. Patient states he is scheduled for pt but not ot Please call patient at [phone].

## 2021-01-25 NOTE — Telephone Encounter (Signed)
My fault, I thought he was referring to OT on his shoulder. He is okay for OT on his both legs, I will call and discuss

## 2021-01-26 ENCOUNTER — Encounter: Payer: Medicaid Other | Attending: Physical Medicine & Rehabilitation | Admitting: Physical Medicine & Rehabilitation

## 2021-01-26 ENCOUNTER — Other Ambulatory Visit: Payer: Self-pay | Admitting: Surgical

## 2021-01-26 ENCOUNTER — Telehealth: Payer: Self-pay

## 2021-01-26 DIAGNOSIS — G8310 Monoplegia of lower limb affecting unspecified side: Secondary | ICD-10-CM | POA: Insufficient documentation

## 2021-01-26 MED ORDER — OXYCODONE HCL 5 MG PO TABS: 5.0000 mg | ORAL_TABLET | Freq: Three times a day (TID) | ORAL | 0 refills | Status: DC

## 2021-01-26 NOTE — Telephone Encounter (Signed)
Pt would called and would like a refill on the hydrocodone

## 2021-01-26 NOTE — Telephone Encounter (Signed)
I spoke with Kisha.  She helped me edit referral to reflect both. Also sending follow up message to [facility] PT

## 2021-01-26 NOTE — Telephone Encounter (Signed)
Sent in

## 2021-01-27 NOTE — Discharge Summary (Signed)
Physician Discharge Summary      Patient ID: Joshua [name] MRN: [id number] DOB/AGE: 03-12-1961 60 y.o.  Admit date: 01/08/2021 Discharge date: 01/11/2021  Admission Diagnoses:  Active Problems:   S/P reverse total shoulder arthroplasty, right   Arthritis of right shoulder region   Discharge Diagnoses:  Same  Surgeries: Procedure(s): RIGHT REVERSE SHOULDER ARTHROPLASTY on 01/08/2021   Consultants:   Discharged Condition: Stable  Hospital Course: Joshua [name] is an 60 y.o. male who was admitted 01/08/2021 with a chief complaint of right pain, and found to have a diagnosis of right shoulder arthritis with rotator cuff arthropathy.  They were brought to the operating room on 01/08/2021 and underwent the above named procedures.  Pt awoke from anesthesia without complication and was transferred to the floor. On POD1, patient was doing well and showed improvement over the POD1 and POD 2.  On POD 3 his block is fully worn off and he was just waiting to have a bowel movement before he felt comfortable with discharge home.  He is able to have a bowel movement and was discharged home on POD 3..  Pt will f/u with Dr. Marlou Sa in clinic in ~2 weeks.   Antibiotics given:  Anti-infectives (From admission, onward)    Start     Dose/Rate Route Frequency Ordered Stop   01/08/21 1400  ceFAZolin (ANCEF) IVPB 2g/100 mL premix        2 g 200 mL/hr over 30 Minutes Intravenous Every 6 hours 01/08/21 1151 01/08/21 2244   01/08/21 0950  vancomycin (VANCOCIN) powder  Status:  Discontinued          As needed 01/08/21 0951 01/08/21 1055   01/08/21 0600  ceFAZolin (ANCEF) IVPB 2g/100 mL premix        2 g 200 mL/hr over 30 Minutes Intravenous On call to O.R. 01/08/21 0550 01/08/21 0747   01/08/21 0554  ceFAZolin (ANCEF) 2-4 GM/100ML-% IVPB       Note to Pharmacy: Donnald Garre   : cabinet override      01/08/21 0554 01/08/21 1759     .  Recent vital signs:  Vitals:   01/11/21 0804 01/11/21 1518   BP: 118/85 137/89  Pulse: 91 70  Resp: 16 17  Temp: 98.6 F (37 C)   SpO2: 98% 94%    Recent laboratory studies:  Results for orders placed or performed during the hospital encounter of 01/08/21  Glucose, capillary  Result Value Ref Range   Glucose-Capillary 125 (H) 70 - 99 mg/dL  Hemoglobin A1c  Result Value Ref Range   Hgb A1c MFr Bld 6.3 (H) 4.8 - 5.6 %   Mean Plasma Glucose 134.11 mg/dL  Glucose, capillary  Result Value Ref Range   Glucose-Capillary 114 (H) 70 - 99 mg/dL  Glucose, capillary  Result Value Ref Range   Glucose-Capillary 190 (H) 70 - 99 mg/dL  Glucose, capillary  Result Value Ref Range   Glucose-Capillary 118 (H) 70 - 99 mg/dL  Glucose, capillary  Result Value Ref Range   Glucose-Capillary 116 (H) 70 - 99 mg/dL  Glucose, capillary  Result Value Ref Range   Glucose-Capillary 140 (H) 70 - 99 mg/dL  Glucose, capillary  Result Value Ref Range   Glucose-Capillary 120 (H) 70 - 99 mg/dL  Glucose, capillary  Result Value Ref Range   Glucose-Capillary 85 70 - 99 mg/dL  Glucose, capillary  Result Value Ref Range   Glucose-Capillary 127 (H) 70 - 99 mg/dL  Glucose, capillary  Result  Value Ref Range   Glucose-Capillary 168 (H) 70 - 99 mg/dL  Glucose, capillary  Result Value Ref Range   Glucose-Capillary 116 (H) 70 - 99 mg/dL  Glucose, capillary  Result Value Ref Range   Glucose-Capillary 144 (H) 70 - 99 mg/dL  Glucose, capillary  Result Value Ref Range   Glucose-Capillary 126 (H) 70 - 99 mg/dL  Glucose, capillary  Result Value Ref Range   Glucose-Capillary 138 (H) 70 - 99 mg/dL  Glucose, capillary  Result Value Ref Range   Glucose-Capillary 117 (H) 70 - 99 mg/dL  Glucose, capillary  Result Value Ref Range   Glucose-Capillary 170 (H) 70 - 99 mg/dL  Glucose, capillary  Result Value Ref Range   Glucose-Capillary 130 (H) 70 - 99 mg/dL    Discharge Medications:   Allergies as of 01/11/2021   No Known Allergies      Medication List     STOP  taking these medications    benzonatate 100 MG capsule Commonly known as: TESSALON   ondansetron 4 MG tablet Commonly known as: Zofran   traMADol 50 MG tablet Commonly known as: Ultram       TAKE these medications    Accu-Chek Guide Me w/Device Kit Use to check blood sugar once daily.   Accu-Chek Guide test strip Generic drug: glucose blood Use to check blood sugar once daily.   Accu-Chek Softclix Lancets lancets Use as instructed   acetaminophen 500 MG tablet Commonly known as: TYLENOL Take 500 mg by mouth every 6 (six) hours as needed for moderate pain or headache.   albuterol 108 (90 Base) MCG/ACT inhaler Commonly known as: VENTOLIN HFA Inhale 2 puffs into the lungs every 6 (six) hours as needed for wheezing or shortness of breath.   amLODipine 5 MG tablet Commonly known as: NORVASC Take 1 tablet (5 mg total) by mouth daily. To lower blood pressure   aspirin 81 MG chewable tablet Chew 81 mg by mouth daily.   bisacodyl 5 MG EC tablet Commonly known as: DULCOLAX Take 5 mg by mouth daily as needed for moderate constipation.   clonazePAM 0.5 MG tablet Commonly known as: KLONOPIN Take 1 tablet (0.5 mg total) by mouth 2 (two) times daily.   docusate sodium 100 MG capsule Commonly known as: COLACE Take 1 capsule (100 mg total) by mouth 2 (two) times daily.   Lacosamide 100 MG Tabs Commonly known as: Vimpat TAKE 2 TABLETS IN MORNING AND 2 AT BEDTIME What changed:  how much to take how to take this when to take this additional instructions   metFORMIN 500 MG tablet Commonly known as: GLUCOPHAGE Take 1 tablet by mouth 2 (two) times daily.   methocarbamol 500 MG tablet Commonly known as: ROBAXIN Take 1 tablet (500 mg total) by mouth every 8 (eight) hours as needed for muscle spasms.   Misc. Devices Misc Please provide BP cuff for patient   pantoprazole 20 MG tablet Commonly known as: Protonix Take 1 tablet (20 mg total) by mouth 2 (two) times daily  before a meal.   tamsulosin 0.4 MG Caps capsule Commonly known as: FLOMAX Take 1 capsule (0.4 mg total) by mouth daily.   VITAMIN A PO Take 1 capsule by mouth daily.        Diagnostic Studies: DG Shoulder Right Port  Result Date: 01/08/2021 CLINICAL DATA:  Post RIGHT shoulder surgery EXAM: PORTABLE RIGHT SHOULDER COMPARISON:  Portable exam 1113 hours compared to 03/16/2020 FINDINGS: Reverse RIGHT shoulder arthroplasty components identified. No fracture  or dislocation seen on single AP view. Mild osseous demineralization. AC joint alignment normal. Visualized ribs intact. IMPRESSION: Reverse RIGHT shoulder arthroplasty without acute abnormalities on single AP view. Electronically Signed   By: Lavonia Dana M.D.   On: 01/08/2021 13:00   XR Shoulder Right  Result Date: 01/23/2021 AP, scapular Y, axillary views of right shoulder reviewed.  Right shoulder reverse shoulder prosthesis in excellent position and alignment without any complicating features.  No fracture or dislocation noted.  No lucencies surrounding the prosthesis or screws.  No dissociation of the components.   Disposition: Discharge disposition: 01-Home or Self Care       Discharge Instructions     Ambulatory referral to Occupational Therapy   Complete by: As directed    Ambulatory referral to Physical Therapy   Complete by: As directed    Call MD / Call 911   Complete by: As directed    If you experience chest pain or shortness of breath, CALL 911 and be transported to the hospital emergency room.  If you develope a fever above 101 F, pus (white drainage) or increased drainage or redness at the wound, or calf pain, call your surgeon's office.   Call MD / Call 911   Complete by: As directed    If you experience chest pain or shortness of breath, CALL 911 and be transported to the hospital emergency room.  If you develope a fever above 101 F, pus (white drainage) or increased drainage or redness at the wound, or calf  pain, call your surgeon's office.   Constipation Prevention   Complete by: As directed    Drink plenty of fluids.  Prune juice may be helpful.  You may use a stool softener, such as Colace (over the counter) 100 mg twice a day.  Use MiraLax (over the counter) for constipation as needed.   Constipation Prevention   Complete by: As directed    Drink plenty of fluids.  Prune juice may be helpful.  You may use a stool softener, such as Colace (over the counter) 100 mg twice a day.  Use MiraLax (over the counter) for constipation as needed.   Diet - low sodium heart healthy   Complete by: As directed    Diet - low sodium heart healthy   Complete by: As directed    Discharge instructions   Complete by: As directed    Okay to shower with dressing in place May use cane in right hand but otherwise keep arm in sling No lifting more than 2 pounds with right arm   Discharge instructions   Complete by: As directed    You may shower, dressing is waterproof.  Do not bathe or soak the operative shoulder in a tub, pool.  Use the CPM machine 3 times a day for one hour each time if you have one.  No lifting with the operative shoulder. Continue use of the sling just as needed for comfort; you don't need to use it if your pain is controlled without it.  Follow-up with Dr. Marlou Sa in ~2 weeks on your given appointment date on 01/22/21 at 10 AM.  We will remove your adhesive bandage at that time.  Call the office with any questions or concerns at [phone].    Dental Antibiotics:  In most cases prophylactic antibiotics for Dental procdeures after total joint surgery are not necessary.  Exceptions are as follows:  1. History of prior total joint infection  2. Severely immunocompromised (Organ Transplant,  cancer chemotherapy, Rheumatoid biologic meds such as [place])  3. Poorly controlled diabetes (A1C > 8.0, blood glucose over 200)  If you have one of these conditions, contact your surgeon for an antibiotic  prescription, prior to your dental procedure.   Increase activity slowly as tolerated   Complete by: As directed    Increase activity slowly as tolerated   Complete by: As directed    Post-operative opioid taper instructions:   Complete by: As directed    POST-OPERATIVE OPIOID TAPER INSTRUCTIONS: It is important to wean off of your opioid medication as soon as possible. If you do not need pain medication after your surgery it is ok to stop day one. Opioids include: Codeine, Hydrocodone(Norco, Vicodin), Oxycodone(Percocet, oxycontin) and hydromorphone amongst others.  Long term and even short term use of opiods can cause: Increased pain response Dependence Constipation Depression Respiratory depression And more.  Withdrawal symptoms can include Flu like symptoms Nausea, vomiting And more Techniques to manage these symptoms Hydrate well Eat regular healthy meals Stay active Use relaxation techniques(deep breathing, meditating, yoga) Do Not substitute Alcohol to help with tapering If you have been on opioids for less than two weeks and do not have pain than it is ok to stop all together.  Plan to wean off of opioids This plan should start within one week post op of your joint replacement. Maintain the same interval or time between taking each dose and first decrease the dose.  Cut the total daily intake of opioids by one tablet each day Next start to increase the time between doses. The last dose that should be eliminated is the evening dose.      Post-operative opioid taper instructions:   Complete by: As directed    POST-OPERATIVE OPIOID TAPER INSTRUCTIONS: It is important to wean off of your opioid medication as soon as possible. If you do not need pain medication after your surgery it is ok to stop day one. Opioids include: Codeine, Hydrocodone(Norco, Vicodin), Oxycodone(Percocet, oxycontin) and hydromorphone amongst others.  Long term and even short term use of opiods  can cause: Increased pain response Dependence Constipation Depression Respiratory depression And more.  Withdrawal symptoms can include Flu like symptoms Nausea, vomiting And more Techniques to manage these symptoms Hydrate well Eat regular healthy meals Stay active Use relaxation techniques(deep breathing, meditating, yoga) Do Not substitute Alcohol to help with tapering If you have been on opioids for less than two weeks and do not have pain than it is ok to stop all together.  Plan to wean off of opioids This plan should start within one week post op of your joint replacement. Maintain the same interval or time between taking each dose and first decrease the dose.  Cut the total daily intake of opioids by one tablet each day Next start to increase the time between doses. The last dose that should be eliminated is the evening dose.           Follow-up Information     [facility] Follow up.   Specialty: Rehabilitation Why: Please call  and arrange appointment time Contact information: [address] [place] [id number] [place] [phone]                 Signed: Donella Stade 01/27/2021, 12:58 PM

## 2021-01-28 ENCOUNTER — Other Ambulatory Visit: Payer: Self-pay

## 2021-01-28 ENCOUNTER — Telehealth: Payer: Self-pay | Admitting: Orthopedic Surgery

## 2021-01-28 ENCOUNTER — Encounter: Payer: Self-pay | Admitting: Occupational Therapy

## 2021-01-28 ENCOUNTER — Telehealth: Payer: Self-pay

## 2021-01-28 ENCOUNTER — Ambulatory Visit: Payer: Medicaid Other | Attending: Family Medicine

## 2021-01-28 ENCOUNTER — Telehealth: Payer: Self-pay | Admitting: Family Medicine

## 2021-01-28 ENCOUNTER — Ambulatory Visit: Payer: Medicaid Other | Admitting: Occupational Therapy

## 2021-01-28 DIAGNOSIS — M6281 Muscle weakness (generalized): Secondary | ICD-10-CM | POA: Diagnosis not present

## 2021-01-28 DIAGNOSIS — G8929 Other chronic pain: Secondary | ICD-10-CM | POA: Insufficient documentation

## 2021-01-28 DIAGNOSIS — R2689 Other abnormalities of gait and mobility: Secondary | ICD-10-CM | POA: Insufficient documentation

## 2021-01-28 DIAGNOSIS — R2681 Unsteadiness on feet: Secondary | ICD-10-CM | POA: Diagnosis not present

## 2021-01-28 DIAGNOSIS — R278 Other lack of coordination: Secondary | ICD-10-CM | POA: Diagnosis not present

## 2021-01-28 DIAGNOSIS — M25511 Pain in right shoulder: Secondary | ICD-10-CM | POA: Diagnosis not present

## 2021-01-28 NOTE — Telephone Encounter (Signed)
Double message. Tried calling back to inform both lauren and dr. Marlou Sa our out of the office but no answer and has no voice mail

## 2021-01-28 NOTE — Therapy (Addendum)
[place] MAIN [facility] SERVICES [address] [place], [place], [address] Phone: [phone]   Fax:  [phone]  Occupational Therapy Evaluation  Patient Details  Name: Joshua [name] MRN: [id number] Date of Birth: 1961-05-17 No data recorded  Encounter Date: 01/28/2021   OT End of Session - 01/28/21 1515     Visit Number 1    Number of Visits 12    Date for OT Re-Evaluation 04/20/21    Authorization Time Period Progress report period starting  01/28/2021    OT Start Time 1345    OT Stop Time 1445    OT Time Calculation (min) 60 min    Activity Tolerance Patient tolerated treatment well    Behavior During Therapy [facility] for tasks assessed/performed             Past Medical History:  Diagnosis Date   Anxiety    Brain tumor ([place]) 02/20/2013   brain tumor removed in March 2014, Dr Donald Pore   Diffuse idiopathic skeletal hyperostosis 06/05/2013   Dizziness    Enlarged prostate    GERD (gastroesophageal reflux disease)    Headache(784.0)    scattered   High cholesterol    Hypertension    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   Meningioma ([place])    Meningioma, recurrent of brain ([place]) 01/31/2019   Neuropathy 12/05/2019   Pre-diabetes    Rotator cuff tear    right   Seizures ([place])    11/27/19 last sz 1 wk ago    Past Surgical History:  Procedure Laterality Date   APPLICATION OF CRANIAL NAVIGATION N/A 01/10/2019   Procedure: APPLICATION OF CRANIAL NAVIGATION;  Surgeon: Erline Levine, MD;  Location: [place];  Service: Neurosurgery;  Laterality: N/A;   CATARACT EXTRACTION Right    CRANIOTOMY Right 11/06/2012   Procedure: CRANIOTOMY TUMOR EXCISION;  Surgeon: Erline Levine, MD;  Location: [place] [facility];  Service: Neurosurgery;  Laterality: Right;  Right Parasagittal craniotomy for meningioma with Stealth   CRANIOTOMY Right 01/10/2019   Procedure: Right Parasagittal Craniotomy for Tumor;  Surgeon: Erline Levine, MD;   Location: [place];  Service: Neurosurgery;  Laterality: Right;  Right parasagittal craniotomy for tumor   ESOPHAGOGASTRODUODENOSCOPY     JOINT REPLACEMENT Right 2018   right hip   REVERSE SHOULDER ARTHROPLASTY Right 01/08/2021   Procedure: RIGHT REVERSE SHOULDER ARTHROPLASTY;  Surgeon: Meredith Pel, MD;  Location: [place];  Service: Orthopedics;  Laterality: Right;   right knee arthroscopy     SHOULDER ARTHROSCOPY Right 06/09/2020   Procedure: RIGHT SHOULDER ARTHROSCOPY AND DEBRIDEMENT;  Surgeon: Newt Minion, MD;  Location: [place];  Service: Orthopedics;  Laterality: Right;    There were no vitals filed for this visit.   Subjective Assessment - 01/28/21 1613     Subjective  Pt. reports having difficulty obtaining the proper fit  position in the CPM.     Pertinent History Pt. is a 60 y.o. male who had a Right TSA secondary to Right Rotator Cuff Arthropathy, and arthritis. Pt.'s PMHx includes: Meningioma with resection x's 2. The first resection occurred 7 years ago, and the 2nd one occurred 1 year ago. Pt. has a history of GERD, Anxiety, Left foot drop, HTN, Neuropathy, and seizures. Pt. resides in a group home, and has assist as needed from neighbors. Pt. enjoys fishing.    Limitations Left shoulder restrictions    Patient Stated Goals To improve his Right UE, and balance.  Currently in Pain? Yes    Pain Score 3     Pain Location Shoulder    Pain Orientation Right    Pain Descriptors / Indicators Aching    Pain Onset 1 to 4 weeks ago            Pt. Reports difficulty obtaining to proper CPM Fit. Multiple calls were placed with pt.'s physician's office to request a representative from the [place] contact the pt. To reassess CPM fit, and for Protocol/parameters today following the initial OT evaluation.    [facility] OT Assessment - 01/28/21 1401       Assessment   Medical Diagnosis Reverse TSA    Onset Date/Surgical Date 01/08/21    Hand Dominance Right     Prior Therapy Acute Care      Precautions   Precautions Shoulder    Type of Shoulder Precautions Reverse TSA    Precaution Comments No shoulder extension, internal rotation, do not stress beyond 30 degrees for external rotation, no lifting over 5 lbs   Required Braces or Orthoses --   Sling Removed 01/22/2021     Restrictions   Weight Bearing Restrictions Yes    RUE Weight Bearing Non weight bearing      Balance Screen   Has the patient fallen in the past 6 months No    Has the patient had a decrease in activity level because of a fear of falling?  No    Is the patient reluctant to leave their home because of a fear of falling?  No      Home  Environment   Family/patient expects to be discharged to: Private residence    Living Arrangements Non-relatives/Friends   [place]   Available Help at Discharge [place] - Number of Steps 6    Bathroom Shower/Tub Tub/Shower unit    Shower/tub characteristics [place] - 2 wheels;Shower seat      Prior Function   Level of Independence Needs assistance with ADLs    Vocation On disability    Leisure Fishing      ADL   Eating/Feeding Independent   Difficulty cutting food   Grooming Independent    Upper Body Bathing Minimal assistance    Lower Body Bathing Independent    Upper Body Dressing Independent    Lower Body Dressing Minimal assistance    Toileting -  Hygiene Independent      IADL   Prior Level of Function Shopping Independent    Shopping Takes care of all shopping needs independently    Prior Level of Function Light Housekeeping Independent    Light Housekeeping --   independent with laundry, dishes with the left hand, difficulty making the bed.   Prior Level of Function Meal Prep Independent    Meal Prep Plans, prepares and serves adequate meals independently    Prior Level of Function Surveyor, quantity  independently on public transportation    Prior Level of Function Medication Managment Independent    Medication Management Is responsible for taking medication in correct dosages at correct time    Prior Level of Function Therapist, sports financial matters independently (budgets, writes checks, pays rent, bills goes to bank), collects and keeps track of income      Mobility   Mobility Status --   Uses a cane  Vision - History   Baseline Vision Wears glasses only for reading      Activity Tolerance   Activity Tolerance Tolerates 10-20 min activity with multiple rests      Cognition   Overall Cognitive Status Within Functional Limits for tasks assessed      Observation/Other Assessments   Focus on Therapeutic Outcomes (FOTO)  45      Sensation   Light Touch Appears Intact      Coordination   Gross Motor Movements are Fluid and Coordinated No    Fine Motor Movements are Fluid and Coordinated No      PROM   Overall PROM Comments Right shoulder flexion 70, abduction 75, external rotation 30 degrees.                             OT Education - 01/28/21 1514     Education Details OT services, POC, goals.    Person(s) Educated Patient    Methods Explanation;Demonstration    Comprehension Verbalized understanding;Returned demonstration              OT Short Term Goals - 01/28/21 1654       OT SHORT TERM GOAL #1   Title Pt. will demonstrate Independence with with HEPs    Baseline Eval: Assess ongoing need for HEPs.    Time 12    Period Weeks    Status New    Target Date 04/20/21               OT Long Term Goals - 01/28/21 1656       OT LONG TERM GOAL #1   Title Pt. will improve RUE shoulder flexion within RTSA protocol parameters to be able to independently reach out to assist with donning pants over feet    Baseline Eval: Pt has difficulty secondary to limited right shoulder flexion.     Time 12    Period Weeks    Status New    Target Date 04/20/21      OT LONG TERM GOAL #2   Title Pt. will mprove right shoulder abduction within RTSA protocol parameters in preparation for independently reaching up to complete hair care.    Baseline Eval: Pt. has difficulty secondary to limited right shoulder Abduction.    Time 12    Period Weeks    Status New    Target Date 04/20/21      OT LONG TERM GOAL #3   Title Pt. will independently be able to use his RUE to apply deoderant thoroughly to the left underarm.    Baseline Eval: Pt. has difficulty secondary to limited RUE    Time 12    Period Weeks    Status New    Target Date 04/20/21      OT LONG TERM GOAL #4   Title Pt. will improve FOTO score by 2 points for clinically relavant functional change for the specified ADL/IADLs    Baseline Eval: FOTO score: 45    Time 12    Period Weeks    Status New    Target Date 04/20/21                   Plan - 01/28/21 1638     Clinical Impression Statement Pt. is a 60 y.o. male who underwent a  Reverse Right TSA. Pt.'s FOTO score is 45. Pt. presents with 3/10 pain in the right shoulder,  Reverse total  shoulder precautions, limited right shoulder ROM, decreased  functional use in the RUE, impaired balance, and limited functional  mobility which all contribute to limiting his ability to perform ADL, and IADL tasks. Pt. will  benefit from OT services for Right shoulder Ther. ex, neuromuscular  re-education, and pt. education about DME, and compensatory strategies to  maximize, independence with ADLs, and IADL tasks.   Occupational performance deficits (Please refer to evaluation for details): ADL's;IADL's    Rehab Potential Fair    Clinical Decision Making Several treatment options, min-mod task modification necessary    Comorbidities Affecting Occupational Performance: May have comorbidities impacting occupational performance    Modification or Assistance to Complete  Evaluation  Min-Moderate modification of tasks or assist with assess necessary to complete eval    OT Frequency 2x / week    OT Duration 12 weeks    OT Treatment/Interventions Self-care/ADL training;Therapeutic exercise;Neuromuscular education;Passive range of motion;Therapeutic activities;Patient/family education;DME and/or AE instruction    Consulted and Agree with Plan of Care Patient             Patient will benefit from skilled therapeutic intervention in order to improve the following deficits and impairments:           Visit Diagnosis: Muscle weakness (generalized)    Problem List Patient Active Problem List   Diagnosis Date Noted   Arthritis of right shoulder region    S/P reverse total shoulder arthroplasty, 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   Essential hypertension 07/02/2020   Prediabetes 07/02/2020   Polyuria 07/02/2020   Nontraumatic complete tear of right rotator cuff    Impingement syndrome of right shoulder    Neuropathy 12/05/2019   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[place]) 03/07/2019   Seizures ([place]) 01/31/2019   Meningioma, recurrent of brain ([place]) 01/31/2019   Meningioma ([place]) 01/03/2019   Diffuse idiopathic skeletal hyperostosis 06/05/2013    Harrel Carina, MS, OTR/L 01/28/2021, 5:19 PM  [place] MAIN [facility] SERVICES [address] [place], [place], [address] Phone: [phone]   Fax:  [phone]  Name: TALEN POSER MRN: [id number] Date of Birth: Jun 09, 1961

## 2021-01-28 NOTE — Telephone Encounter (Signed)
Holding....tried calling elaine back but no answer and no voice mail available

## 2021-01-28 NOTE — Telephone Encounter (Signed)
Joshua [name] (OT) calling from [facility] asking for a return call. She is requesting perimeters and protocols for revise total right shoulder surgery. She also is asking for a follow up from Rep with CMT and look at positioning with machine. Joshua [name] phone number is [phone].

## 2021-01-28 NOTE — Telephone Encounter (Signed)
Copied from [place] [id number]. Topic: General - Other >> Jan 25, 2021  2:41 PM Joshua [name] wrote: Reason for CRM: Patient called in to inform Joshua [name] to please contact Joshua [name] office and see why he is not being scheduled for PT and OT since having his right shoulder surgery. Per patient he was told that following the surgery he would have OT and now he is not getting that. Would like some answers please any questions please call  Ph# [phone]   Disregard. Patient has OT and PT scheduled

## 2021-01-28 NOTE — Therapy (Signed)
[place] MAIN [facility] SERVICES [address] [place], [place], [address] Phone: [phone]   Fax:  [phone]  Physical Therapy Evaluation  Patient Details  Name: Joshua [name] MRN: [id number] Date of Birth: 1960/09/19 Referring Provider (PT): Charlott Rakes, MD   Encounter Date: 01/28/2021   PT End of Session - 01/28/21 1810     Visit Number 1    Number of Visits 25    Date for PT Re-Evaluation 04/22/21    Authorization Type eval completed 01/12/3661, cert for 94/01/6545    PT Start Time 1454    PT Stop Time 1545    PT Time Calculation (min) 51 min    Equipment Utilized During Treatment Gait belt    Activity Tolerance Patient tolerated treatment well    Behavior During Therapy [facility] for tasks assessed/performed             Past Medical History:  Diagnosis Date   Anxiety    Brain tumor ([place]) 02/20/2013   brain tumor removed in March 2014, Dr Donald Pore   Diffuse idiopathic skeletal hyperostosis 06/05/2013   Dizziness    Enlarged prostate    GERD (gastroesophageal reflux disease)    Headache(784.0)    scattered   High cholesterol    Hypertension    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[place]) 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   Meningioma ([place])    Meningioma, recurrent of brain ([place]) 01/31/2019   Neuropathy 12/05/2019   Pre-diabetes    Rotator cuff tear    right   Seizures ([place])    11/27/19 last sz 1 wk ago    Past Surgical History:  Procedure Laterality Date   APPLICATION OF CRANIAL NAVIGATION N/A 01/10/2019   Procedure: APPLICATION OF CRANIAL NAVIGATION;  Surgeon: Erline Levine, MD;  Location: [place];  Service: Neurosurgery;  Laterality: N/A;   CATARACT EXTRACTION Right    CRANIOTOMY Right 11/06/2012   Procedure: CRANIOTOMY TUMOR EXCISION;  Surgeon: Erline Levine, MD;  Location: [place] NEURO ORS;  Service: Neurosurgery;  Laterality: Right;  Right Parasagittal craniotomy for meningioma with Stealth   CRANIOTOMY Right 01/10/2019   Procedure:  Right Parasagittal Craniotomy for Tumor;  Surgeon: Erline Levine, MD;  Location: [place];  Service: Neurosurgery;  Laterality: Right;  Right parasagittal craniotomy for tumor   ESOPHAGOGASTRODUODENOSCOPY     JOINT REPLACEMENT Right 2018   right hip   REVERSE SHOULDER ARTHROPLASTY Right 01/08/2021   Procedure: RIGHT REVERSE SHOULDER ARTHROPLASTY;  Surgeon: Meredith Pel, MD;  Location: [place];  Service: Orthopedics;  Laterality: Right;   right knee arthroscopy     SHOULDER ARTHROSCOPY Right 06/09/2020   Procedure: RIGHT SHOULDER ARTHROSCOPY AND DEBRIDEMENT;  Surgeon: Newt Minion, MD;  Location: [place];  Service: Orthopedics;  Laterality: Right;    There were no vitals filed for this visit.  Subjective Assessment - 01/28/21 1455     Subjective Pt presents to PT using SPC following OT evaluation for R TSA. Pt presents to PT d/t difficulty with gait, balance and LE strength. Pt does have LLE brace but does not wear it. Pt lives in group home, and reports his neighbors help him.    Pertinent History Pt presents to PT using SPC following OT evaluation for R TSA. Pt is presenting to PT due to difficulty with gait, balance and LE strength. Pt reports he as attended neuro rehab before following second brain surgery (2021). He reports he did PT for about a month. Pt saw functional improvements but  reports his legs were "so weak they just lost the strength in them." Pt would like to strengthen his legs so he won't "be dragging them." Pt states, "my right is pretty much my strong leg." Due to pt's recent R rTSA he is unable to rely on UEs as much to maintain precautions. Pt says he has LLE brace but doesn't wear it because it was not comfortable. He reports no pain in legs currently. Pt does report increased L foot "sitffness." Pt reports no falls, but says he has had multiple near-falls.PMH includeds R TSA (01/08/2021) secondary to R Rotator Cuff Arthropathy and arthritis, meningioma with  resection x's 2, where first resection occurred 7 years ago, and 2nd resection 1 year ago.Other PMH includes GERD, Anxiety, L foot drop, HTN, Neuropathy, and seizures.    Limitations Lifting;Standing;Walking;House hold activities   lifting: d/t to recent rTSA; difficulty getting in and out of the tub   How long can you sit comfortably? unlimited    How long can you stand comfortably? reports limited primarily d/t L LE stiffness, difficulty picking up L foot    How long can you walk comfortably? Reports can walk about 100 yards, reports easily fatigued with gait, states "I start to lean forward a lot."    Diagnostic tests 12/16/20 CT HEAD WO CONTRAST, per chart "IMPRESSION:  1. No acute intracranial findings.  2. Stable postsurgical changes of frontoparietal craniotomy and  meningioma resection."    Patient Stated Goals Pt would like to strengthen his legs and to improve his balance so he doesn't have to hold onto anything.    Currently in Pain? Yes    Pain Location Shoulder    Pain Orientation Right    Pain Onset 1 to 4 weeks ago             SUBJECTIVE Chief complaint: gait difficulty, balance difficulty, weakness Recent changes in overall health/medication: No Directional pattern for falls: No falls reported. However, pt does report he often has near-falls, including one yesterday. He says it usually occurs because his left leg is stiff and he has difficulty lifting his left leg. Pt reports he "catches" himself (now with his left arm as to not break R shoulder precautions).  Prior history of physical therapy for balance: yes Follow-up appointment with MD: in one month with his provider Red flags (bowel/bladder changes, saddle paresthesia, personal history of cancer, chills/fever, night sweats, unrelenting pain) Negative    OBJECTIVE  MUSCULOSKELETAL:  Posture Rounded shoulders, increased thoracic flexion  Gait Decreased DF on L/foot drag (foot scuffs against floor), also with  decreased DF on R but not as extreme, decreased B step-length, increased knee and hip flexion B throughout gait cycle. Uses SPC on L side.  Strength R/L Grossly 4/5 BLEs  ROM: L DF -5 deg. From neutral L PF 23 deg.  R DF 5 deg R PF 35 deg.    NEUROLOGICAL:   Sensation Decrease sensation reported in LLE compared to RLE.  Proprioception in LE intact.  Reports numbness in L anterior knee (constant) and intermittent numbness in L foot.   Coordination/Cerebellar Finger to Nose: WNL Heel to Shin: unable to perform d/t LE weakness Rapid alternating movements: WNL   FUNCTIONAL OUTCOME MEASURES   Results Comments  BERG /56 DEFERRED  DGI /24 DEFERRED   TUG 18.83 seconds Indicates fall risk  6 Minute Walk Test 610 ft with SPC  Indicates impaired gait ability/impaired functional capacity  10 Meter Gait Speed 0.625 m/s with SPC  in LUE Below normative values for full community ambulation    FOTO: FOTO survey needs to be updated to reflect LE/balance questions. Deferred to future session.  PT reinforced shoulder precautions, activity modification to help pt maintain R shoulder precautions, especially with transfers/STS. Pt verbalizes understanding and demos safe technique with STS following instruction where he does not use RUE to assist with stand.   Pt escorted in transport chair back and then instructed to take courtesy car back to his car for safety at end of session. Pt verbalizes understanding.  ASSESSMENT Pt is a pleasant 60 year-old male referred for difficulty with LE strength, gait and imbalance. PT examination reveals impairments in LE strength, LE ROM, impaired gait mechanics, speed and ability as well as functional capacity, and increased fall risk as evidenced by performance on the TUG. While pt has not had a recent fall he reports frequent near-falls where he must "catch" himself to prevent fall. PT reinforced shoulder precautions and activity modification as pt recently  had a R rTSA (being seen for in OT), and reinforced importance of using SPC (in LUE). Pt verbalized understanding. Gait observation also reveals impaired sequencing/mechanics that increase pt's fall risk. The pt will benefit from skilled PT services to address deficits in balance, gait, strength, endurance and ROM to decrease risk for future falls.    PLAN Next Visit: DGI, initiate ROM, strength, endurance and balance exercises as able HEP: to be issued next session  PT Education - 01/28/21 1809     Education Details Findings of examination, indications for PT/POC    Person(s) Educated Patient    Methods Explanation    Comprehension Verbalized understanding              PT Short Term Goals - 01/28/21 1828       PT SHORT TERM GOAL #1   Title Pt will be independent with HEP in order to improve strength and balance in order to decrease fall risk and improve function at home.    Baseline 7/14: to be initiated    Time 6    Period Weeks    Status New    Target Date 03/11/21      PT SHORT TERM GOAL #2   Title Pt to increase PROM/AROM of L ankle DF by at least 10 degrees to promote functional mobility    Baseline 7/14: L ankle DF lacking 5 deg from neutral    Time 6    Period Weeks    Status New    Target Date 03/11/21               PT Long Term Goals - 01/28/21 1832       PT LONG TERM GOAL #1   Title Pt will decrease TUG to below 14 seconds/decrease in order to demonstrate decreased fall risk.    Baseline 7/14: 18.83 sec    Time 12    Period Weeks    Status New    Target Date 04/22/21      PT LONG TERM GOAL #2   Title Pt will increase 6MWT by at least 20m (151ft) in order to demonstrate clinically significant improvement in endurance and community ambulation .    Baseline 7/14: 610 ft with SPC    Time 12    Period Weeks    Status New    Target Date 04/22/21      PT LONG TERM GOAL #3   Title Patient will increase 10 meter walk test to >  1.3m/s as to improve  gait speed for better community ambulation and to reduce fall risk.    Baseline 7/14: 0.625 m/s wtih SPC in LUE    Time 12    Period Weeks    Status New    Target Date 04/22/21      PT LONG TERM GOAL #4   Title Pt will improve DGI by at least 3 points in order to demonstrate clinically significant improvement in balance and decreased risk for falls.    Baseline 7/14: deferred to next session    Time 12    Period Weeks    Status New    Target Date 04/22/21      PT LONG TERM GOAL #5   Title Patient will increase FOTO score by at least 10 points to demonstrate an improvement in mobility and quality of life.    Baseline 7/14: deferred dt incorrect questionnaire, pt will take future session with corrected version    Time 12    Period Weeks    Status New    Target Date 04/22/21                    Plan - 01/28/21 1812     Clinical Impression Statement Pt is a pleasant 60 year-old male referred for difficulty with LE strength, gait and imbalance. PT examination reveals impairments in LE strength, LE ROM, impaired gait mechanics, speed and ability as well as functional capacity, and increased fall risk as evidenced by performance on the TUG. While pt has not had a recent fall he reports frequent near-falls where he must "catch" himself to prevent fall. PT reinforced shoulder precautions and activity modification as pt recently had a R rTSA (being seen for in OT), and reinforced importance of using SPC (in LUE). Pt verbalized understanding. Gait observation also reveals impaired sequencing/mechanics that increase pt's fall risk. The pt will benefit from skilled PT services to address deficits in balance, gait, strength, endurance and ROM to decrease risk for future falls.    Personal Factors and Comorbidities Comorbidity 1;Comorbidity 2;Comorbidity 3+;Social Background;Past/Current Experience;Time since onset of injury/illness/exacerbation;Fitness    Comorbidities PMH includeds R TSA  (01/08/2021) secondary to R Rotator Cuff Arthropathy and arthritis, meningioma with resection x's 2, where first resection occurred 7 years ago, and 2nd resection 1 year ago.Other PMH includes GERD, Anxiety, L foot drop, HTN, Neuropathy, and seizures.    Examination-Activity Limitations Bathing;Bed Mobility;Bend;Carry;Dressing;Lift;Locomotion Level;Reach Overhead;Squat;Stairs;Stand;Transfers;Hygiene/Grooming    Examination-Participation Restrictions Community Activity;Meal Prep;Shop;Yard Work;Cleaning;Laundry    Stability/Clinical Decision Making Evolving/Moderate complexity    Clinical Decision Making Moderate    Rehab Potential Good    PT Frequency 2x / week    PT Duration 12 weeks    PT Treatment/Interventions Biofeedback;ADLs/Self Care Home Management;Cryotherapy;Electrical Stimulation;Moist Heat;DME Instruction;Gait training;Stair training;Functional mobility training;Therapeutic activities;Therapeutic exercise;Balance training;Neuromuscular re-education;Patient/family education;Orthotic Fit/Training;Manual techniques;Passive range of motion;Scar mobilization;Energy conservation;Splinting;Taping;Joint Manipulations    PT Next Visit Plan Further balance assessment, inititate strengthening, endurance exercises and HEP    PT Home Exercise Plan to be initiated next sessoin    Consulted and Agree with Plan of Care Patient             Patient will benefit from skilled therapeutic intervention in order to improve the following deficits and impairments:  Abnormal gait, Decreased activity tolerance, Decreased endurance, Decreased range of motion, Decreased knowledge of use of DME, Decreased strength, Hypomobility, Impaired sensation, Impaired UE functional use, Improper body mechanics, Pain, Decreased balance, Decreased coordination, Decreased mobility, Hypermobility, Impaired  flexibility, Postural dysfunction  Visit Diagnosis: Other abnormalities of gait and mobility  Other lack of  coordination  Muscle weakness (generalized)  Unsteadiness on feet  Chronic right shoulder pain     Problem List Patient Active Problem List   Diagnosis Date Noted   Arthritis of right shoulder region    S/P reverse total shoulder arthroplasty, 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 [place], [place], [address] Phone: [phone]   Fax:  [phone]  Name: Joshua [name] MRN: [id number] Date of Birth: Aug 22, 1960

## 2021-01-28 NOTE — Telephone Encounter (Signed)
Joshua [name] from [place] [facility] called she is requesting Dr.Deans reverse total shoulder protocol to be sent fax:[phone] attention to Joshua [name] call back:[phone]

## 2021-01-29 NOTE — Telephone Encounter (Signed)
Will follow up with HHPT/OT this morning

## 2021-01-29 NOTE — Telephone Encounter (Signed)
IC back. Was able to LM with instructions per Dr Marlou Sa no lifting greater than 5lbs and ok for him to use black ROM brace

## 2021-01-31 ENCOUNTER — Encounter: Payer: Self-pay | Admitting: Orthopedic Surgery

## 2021-02-02 ENCOUNTER — Ambulatory Visit: Payer: Medicaid Other

## 2021-02-02 ENCOUNTER — Encounter: Payer: Self-pay | Admitting: Occupational Therapy

## 2021-02-02 ENCOUNTER — Ambulatory Visit: Payer: Medicaid Other | Admitting: Occupational Therapy

## 2021-02-02 ENCOUNTER — Other Ambulatory Visit: Payer: Self-pay

## 2021-02-02 DIAGNOSIS — M25511 Pain in right shoulder: Secondary | ICD-10-CM

## 2021-02-02 DIAGNOSIS — R2689 Other abnormalities of gait and mobility: Secondary | ICD-10-CM | POA: Diagnosis not present

## 2021-02-02 DIAGNOSIS — G8929 Other chronic pain: Secondary | ICD-10-CM

## 2021-02-02 DIAGNOSIS — R278 Other lack of coordination: Secondary | ICD-10-CM

## 2021-02-02 DIAGNOSIS — R2681 Unsteadiness on feet: Secondary | ICD-10-CM

## 2021-02-02 DIAGNOSIS — M6281 Muscle weakness (generalized): Secondary | ICD-10-CM | POA: Diagnosis not present

## 2021-02-02 NOTE — Therapy (Signed)
[place] MAIN [facility] SERVICES [address] [place], [place], [address] Phone: [phone]   Fax:  [phone]  Physical Therapy Treatment  Patient Details  Name: Joshua [name] MRN: [id number] Date of Birth: Mar 18, 1961 Referring Provider (PT): Charlott Rakes, MD   Encounter Date: 02/02/2021   PT End of Session - 02/02/21 1714     Visit Number 2    Number of Visits 25    Date for PT Re-Evaluation 04/22/21    Authorization Type eval completed 3/61/4431, cert for 54/0/0867    PT Start Time 6195    PT Stop Time 1555    PT Time Calculation (min) 44 min    Equipment Utilized During Treatment Gait belt    Activity Tolerance Patient tolerated treatment well    Behavior During Therapy [facility] for tasks assessed/performed             Past Medical History:  Diagnosis Date   Anxiety    Brain tumor ([place]) 02/20/2013   brain tumor removed in March 2014, Dr Donald Pore   Diffuse idiopathic skeletal hyperostosis 06/05/2013   Dizziness    Enlarged prostate    GERD (gastroesophageal reflux disease)    Headache(784.0)    scattered   High cholesterol    Hypertension    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[place]) 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   Meningioma ([place])    Meningioma, recurrent of brain ([place]) 01/31/2019   Neuropathy 12/05/2019   Pre-diabetes    Rotator cuff tear    right   Seizures ([place])    11/27/19 last sz 1 wk ago    Past Surgical History:  Procedure Laterality Date   APPLICATION OF CRANIAL NAVIGATION N/A 01/10/2019   Procedure: APPLICATION OF CRANIAL NAVIGATION;  Surgeon: Erline Levine, MD;  Location: [place];  Service: Neurosurgery;  Laterality: N/A;   CATARACT EXTRACTION Right    CRANIOTOMY Right 11/06/2012   Procedure: CRANIOTOMY TUMOR EXCISION;  Surgeon: Erline Levine, MD;  Location: [place] NEUR[facility]S;  Service: Neurosurgery;  Laterality: Right;  Right Parasagittal craniotomy for meningioma with Stealth   CRANIOTOMY Right 01/10/2019   Procedure:  Right Parasagittal Craniotomy for Tumor;  Surgeon: Erline Levine, MD;  Location: [place];  Service: Neurosurgery;  Laterality: Right;  Right parasagittal craniotomy for tumor   ESOPHAGOGASTRODUODENOSCOPY     JOINT REPLACEMENT Right 2018   right hip   REVERSE SHOULDER ARTHROPLASTY Right 01/08/2021   Procedure: RIGHT REVERSE SHOULDER ARTHROPLASTY;  Surgeon: Meredith Pel, MD;  Location: [place];  Service: Orthopedics;  Laterality: Right;   right knee arthroscopy     SHOULDER ARTHROSCOPY Right 06/09/2020   Procedure: RIGHT SHOULDER ARTHROSCOPY AND DEBRIDEMENT;  Surgeon: Newt Minion, MD;  Location: [place];  Service: Orthopedics;  Laterality: Right;    There were no vitals filed for this visit.   Subjective Assessment - 02/02/21 1508     Subjective Pt reports he has been using ice pack on R shoulder to help with pain. Pt reports some difficulty with precautions as he is R handed, but reports that he has been modifying his activity for improved compliance. He rates current pain as 5/10.    Pertinent History Pt presents to PT using SPC following OT evaluation for R TSA. Pt is presenting to PT due to difficulty with gait, balance and LE strength. Pt reports he as attended neuro rehab before following second brain surgery (2021). He reports he did PT for about a month. Pt saw functional improvements but  reports his legs were "so weak they just lost the strength in them." Pt would like to strengthen his legs so he won't "be dragging them." Pt states, "my right is pretty much my strong leg." Due to pt's recent R rTSA he is unable to rely on UEs as much to maintain precautions. Pt says he has LLE brace but doesn't wear it because it was not comfortable. He reports no pain in legs currently. Pt does report increased L foot "sitffness." Pt reports no falls, but says he has had multiple near-falls.PMH includeds R TSA (01/08/2021) secondary to R Rotator Cuff Arthropathy and arthritis,  meningioma with resection x's 2, where first resection occurred 7 years ago, and 2nd resection 1 year ago.Other PMH includes GERD, Anxiety, L foot drop, HTN, Neuropathy, and seizures.    Limitations Lifting;Standing;Walking;House hold activities   lifting: d/t to recent rTSA; difficulty getting in and out of the tub   How long can you sit comfortably? unlimited    How long can you stand comfortably? reports limited primarily d/t L LE stiffness, difficulty picking up L foot    How long can you walk comfortably? Reports can walk about 100 yards, reports easily fatigued with gait, states "I start to lean forward a lot."    Diagnostic tests 12/16/20 CT HEAD WO CONTRAST, per chart "IMPRESSION:  1. No acute intracranial findings.  2. Stable postsurgical changes of frontoparietal craniotomy and  meningioma resection."    Patient Stated Goals Pt would like to strengthen his legs and to improve his balance so he doesn't have to hold onto anything.    Currently in Pain? Yes    Pain Score 6     Pain Location Shoulder    Pain Orientation Right    Pain Onset 1 to 4 weeks ago              TREATMENT  FOTO: 50  DGI: 12/24   RTB hamstring curls - 3x10; rates medium  LAQ with 4# AW BLEs - 2x15; progressed to 5# AW on BLES for 15x, rates medium  Seated marches with 5# AW BLES - 3x15   Standing hip ext. (LUE on support surface) with 5# AW BLES - 10x each LE  Standing hip abduction with 5# AW BLES - 10x each LE  Nustep level 4 (chair 12, only using LUE for exercise). Min assist for mount/dismount and LLE positioning throughout. Cuing for SPM >60s (pt maintains 502-70s). Rates medium. Reports LE fatigue. X 6 min   Access Code: [email or web address] URL: [email or web address] Date: 02/02/2021 Prepared by: Joshua [name]  Exercises Seated March - 1 x daily - 4 x weekly - 3 sets - 15 reps Seated Hamstring Curl with Anchored Resistance - 1 x daily - 4 x weekly - 3 sets - 15 reps Seated Long Arc Quad  with Ankle Weight - 1 x daily - 4 x weekly - 3 sets - 15 reps      PT Education - 02/02/21 1709     Education Details further assessment findings, HEP    Person(s) Educated Patient    Methods Explanation;Demonstration;Tactile cues;Verbal cues;Handout    Comprehension Verbalized understanding;Returned demonstration              PT Short Term Goals - 01/28/21 1828       PT SHORT TERM GOAL #1   Title Pt will be independent with HEP in order to improve strength and balance in order to decrease fall risk and improve function at home.  Baseline 7/14: to be initiated    Time 6    Period Weeks    Status New    Target Date 03/11/21      PT SHORT TERM GOAL #2   Title Pt to increase PROM/AROM of L ankle DF by at least 10 degrees to promote functional mobility    Baseline 7/14: L ankle DF lacking 5 deg from neutral    Time 6    Period Weeks    Status New    Target Date 03/11/21               PT Long Term Goals - 01/28/21 1832       PT LONG TERM GOAL #1   Title Pt will decrease TUG to below 14 seconds/decrease in order to demonstrate decreased fall risk.    Baseline 7/14: 18.83 sec    Time 12    Period Weeks    Status New    Target Date 04/22/21      PT LONG TERM GOAL #2   Title Pt will increase 6MWT by at least 77m (175ft) in order to demonstrate clinically significant improvement in endurance and community ambulation .    Baseline 7/14: 610 ft with SPC    Time 12    Period Weeks    Status New    Target Date 04/22/21      PT LONG TERM GOAL #3   Title Patient will increase 10 meter walk test to >1.24m/s as to improve gait speed for better community ambulation and to reduce fall risk.    Baseline 7/14: 0.625 m/s wtih SPC in LUE    Time 12    Period Weeks    Status New    Target Date 04/22/21      PT LONG TERM GOAL #4   Title Pt will improve DGI by at least 3 points in order to demonstrate clinically significant improvement in balance and decreased risk for  falls.    Baseline 7/14: deferred to next session    Time 12    Period Weeks    Status New    Target Date 04/22/21      PT LONG TERM GOAL #5   Title Patient will increase FOTO score by at least 10 points to demonstrate an improvement in mobility and quality of life.    Baseline 7/14: deferred dt incorrect questionnaire, pt will take future session with corrected version    Time 12    Period Weeks    Status New    Target Date 04/22/21                   Plan - 02/02/21 1709     Clinical Impression Statement Further assessment completed on this date. Pt FOTO score is 50. DGI score 12/24, indicating pt at increased risk for falls and dynamic balance is impaired. HEP issued and reviewed with pt on this date, including instructing pt in safe performance of HEP to maintain shoulder precautions. The pt verbalized understanding and returned demo of correct technique. The pt will benefit from further skilled PT to improve balance, strength, endurance and gait mechanics to reduce fall risk.    Personal Factors and Comorbidities Comorbidity 1;Comorbidity 2;Comorbidity 3+;Social Background;Past/Current Experience;Time since onset of injury/illness/exacerbation;Fitness    Comorbidities PMH includeds R TSA (01/08/2021) secondary to R Rotator Cuff Arthropathy and arthritis, meningioma with resection x's 2, where first resection occurred 7 years ago, and 2nd resection 1 year ago.Other PMH includes GERD,  Anxiety, L foot drop, HTN, Neuropathy, and seizures.    Examination-Activity Limitations Bathing;Bed Mobility;Bend;Carry;Dressing;Lift;Locomotion Level;Reach Overhead;Squat;Stairs;Stand;Transfers;Hygiene/Grooming    Examination-Participation Restrictions Community Activity;Meal Prep;Shop;Yard Work;Cleaning;Laundry    Stability/Clinical Decision Making Evolving/Moderate complexity    Rehab Potential Good    PT Frequency 2x / week    PT Duration 12 weeks    PT Treatment/Interventions  Biofeedback;ADLs/Self Care Home Management;Cryotherapy;Electrical Stimulation;Moist Heat;DME Instruction;Gait training;Stair training;Functional mobility training;Therapeutic activities;Therapeutic exercise;Balance training;Neuromuscular re-education;Patient/family education;Orthotic Fit/Training;Manual techniques;Passive range of motion;Scar mobilization;Energy conservation;Splinting;Taping;Joint Manipulations    PT Next Visit Plan Further balance assessment, inititate strengthening, endurance exercises and HEP, dynamic balance, endurance    PT Home Exercise Plan Access Code: [email or web address]    Consulted and Agree with Plan of Care Patient             Patient will benefit from skilled therapeutic intervention in order to improve the following deficits and impairments:  Abnormal gait, Decreased activity tolerance, Decreased endurance, Decreased range of motion, Decreased knowledge of use of DME, Decreased strength, Hypomobility, Impaired sensation, Impaired UE functional use, Improper body mechanics, Pain, Decreased balance, Decreased coordination, Decreased mobility, Hypermobility, Impaired flexibility, Postural dysfunction  Visit Diagnosis: Other abnormalities of gait and mobility  Muscle weakness (generalized)  Unsteadiness on feet  Other lack of coordination  Chronic right shoulder pain     Problem List Patient Active Problem List   Diagnosis Date Noted   Arthritis of right shoulder region    S/P reverse total shoulder arthroplasty, 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) 01/31/2019   Meningioma, recurrent of brain ([place]) 01/31/2019   Meningioma ([place]) 01/03/2019   Diffuse idiopathic skeletal hyperostosis 06/05/2013   Joshua [name] PT, DPT 02/02/2021, 5:17 PM  [place] MAIN [facility] SERVICES [address] [place], [place], [address] Phone: [phone]   Fax:  [phone]  Name: Joshua [name] MRN: [id number] Date of Birth: [birth year]

## 2021-02-02 NOTE — Therapy (Signed)
[place] MAIN [facility] SERVICES [address] [place], [place], [address] Phone: [phone]   Fax:  [phone]  Occupational Therapy Treatment  Patient Details  Name: Joshua [name] MRN: [id number] Date of Birth: Nov 16, 1960 No data recorded  Encounter Date: 02/02/2021   OT End of Session - 02/02/21 1726     Visit Number 2    Number of Visits 12    Date for OT Re-Evaluation 04/20/21    Authorization Time Period Progress report period starting  01/28/2021    OT Start Time 1600    OT Stop Time 1700    OT Time Calculation (min) 60 min    Activity Tolerance Patient tolerated treatment well    Behavior During Therapy [facility] for tasks assessed/performed             Past Medical History:  Diagnosis Date   Anxiety    Brain tumor ([place]) 02/20/2013   brain tumor removed in March 2014, Dr Donald Pore   Diffuse idiopathic skeletal hyperostosis 06/05/2013   Dizziness    Enlarged prostate    GERD (gastroesophageal reflux disease)    Headache(784.0)    scattered   High cholesterol    Hypertension    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[place]) 03/07/2019   Meningioma ([place])    Meningioma, recurrent of brain ([place]) 01/31/2019   Neuropathy 12/05/2019   Pre-diabetes    Rotator cuff tear    right   Seizures ([place])    11/27/19 last sz 1 wk ago    Past Surgical History:  Procedure Laterality Date   APPLICATION OF CRANIAL NAVIGATION N/A 01/10/2019   Procedure: APPLICATION OF CRANIAL NAVIGATION;  Surgeon: Erline Levine, MD;  Location: [place];  Service: Neurosurgery;  Laterality: N/A;   CATARACT EXTRACTION Right    CRANIOTOMY Right 11/06/2012   Procedure: CRANIOTOMY TUMOR EXCISION;  Surgeon: Erline Levine, MD;  Location: [place] NEURO ORS;  Service: Neurosurgery;  Laterality: Right;  Right Parasagittal craniotomy for meningioma with Stealth   CRANIOTOMY Right 01/10/2019   Procedure: Right Parasagittal Craniotomy for Tumor;  Surgeon: Erline Levine, MD;   Location: [place];  Service: Neurosurgery;  Laterality: Right;  Right parasagittal craniotomy for tumor   ESOPHAGOGASTRODUODENOSCOPY     JOINT REPLACEMENT Right 2018   right hip   REVERSE SHOULDER ARTHROPLASTY Right 01/08/2021   Procedure: RIGHT REVERSE SHOULDER ARTHROPLASTY;  Surgeon: Meredith Pel, MD;  Location: [place];  Service: Orthopedics;  Laterality: Right;   right knee arthroscopy     SHOULDER ARTHROSCOPY Right 06/09/2020   Procedure: RIGHT SHOULDER ARTHROSCOPY AND DEBRIDEMENT;  Surgeon: Newt Minion, MD;  Location: [place];  Service: Orthopedics;  Laterality: Right;    There were no vitals filed for this visit.   Subjective Assessment - 02/02/21 1725     Subjective  Pt. reports 1/10 right shoulder pain    Pertinent History Pt. is a 60 y.o. male who had a Right TSA secondary to Right Rotator Cuff Arthropathy, and arthritis. Pt.'s PMHx includes: Meningioma with resection x's 2. The first resection occurred 7 years ago, and the 2nd one occurred 1 year ago. Pt. has a history of GERD, Anxiety, Left foot drop, HTN, Neuropathy, and seizures. Pt. resides in a group home, and has assist as needed from neighbors. Pt. enjoys fishing.    Patient Stated Goals To improve his Right UE, and balance.    Currently in Pain? Yes    Pain Score 1  Pain Location Shoulder    Pain Orientation Right    Pain Descriptors / Indicators Sore    Pain Type Surgical pain    Pain Onset 1 to 4 weeks ago             There. Ex.   Pt. performed AAROM for right shoulder abduction, PROM for right shoulder flexion, and external rotation in sitting followed by supine. Pt. performed isometric shoulder flexion, and abduction in sitting. Pt. education was provided about home exercises with a visual handout was provided through [place]. Measurements were obtained: right shoulder flexion PROM 74 in sitting 70 in supine,  shoulder abduction AAROM 75 in sitting, and 82 in supine, external  rotation 30 degrees in sitting.  Pt. presents with 1/10 pain in the right shoulder. Pt. tolerated ROM well. Pt. required verbal, and tactile cues, as well as visual demonstration for the proper there. Ex. form initially when reviewing HEP, and self-ROM. Pt. was able to demonstrate proper technique after reviewing the exercises, and issuing a HEP. Plan to continue working towards improving ROM in order to improve functional engagement of the RUE during ADLs, and IADL tasks.                       OT Education - 02/02/21 1726     Education Details OT services, POC, goals.    Person(s) Educated Patient    Methods Explanation;Demonstration    Comprehension Verbalized understanding;Returned demonstration              OT Short Term Goals - 01/28/21 1654       OT SHORT TERM GOAL #1   Title Pt. will demonstrate Independence with with HEPs    Baseline Eval: Assess ongoing need for HEPs.    Time 12    Period Weeks    Status New    Target Date 04/20/21               OT Long Term Goals - 01/28/21 1656       OT LONG TERM GOAL #1   Title Pt. will improve RUE shoulder flexion within RTSA protocol parameters to be able to independently reach out to assist with donning pants over feet    Baseline Eval: Pt has difficulty secondary to limited right shoulder flexion.    Time 12    Period Weeks    Status New    Target Date 04/20/21      OT LONG TERM GOAL #2   Title Pt. will mprove right shoulder abduction within RTSA protocol parameters in preparation for independently reaching up to complete hair care.    Baseline Eval: Pt. has difficulty secondary to limited right shoulder Abduction.    Time 12    Period Weeks    Status New    Target Date 04/20/21      OT LONG TERM GOAL #3   Title Pt. will independently be able to use his RUE to apply deoderant thoroughly to the left underarm.    Baseline Eval: Pt. has difficulty secondary to limited RUE    Time 12    Period  Weeks    Status New    Target Date 04/20/21      OT LONG TERM GOAL #4   Title Pt. will improve FOTO score by 2 points for clinically relavant functional change for the specified ADL/IADLs    Baseline Eval: FOTO score: 45    Time 12    Period Weeks  Status New    Target Date 04/20/21                   Plan - 02/02/21 1727     Clinical Impression Statement Pt. presents with 1/10 pain in the right shoulder. Pt. tolerated ROM well. Pt. required verbal, and tactile cues, as well as visual demonstration for the proper there. Ex. form initially when reviewing HEP, and self-ROM. Pt. was able to demonstrate proper technique after reviewing the exercises, and issuing a HEP. Plan to continue working towards improving ROM in order to improve functional engagement of the RUE during ADLs, and IADL tasks.       Occupational performance deficits (Please refer to evaluation for details): ADL's;IADL's    Rehab Potential Fair    Clinical Decision Making Several treatment options, min-mod task modification necessary    Comorbidities Affecting Occupational Performance: May have comorbidities impacting occupational performance    Modification or Assistance to Complete Evaluation  Min-Moderate modification of tasks or assist with assess necessary to complete eval    OT Frequency 2x / week    OT Duration 12 weeks    OT Treatment/Interventions Self-care/ADL training;Therapeutic exercise;Neuromuscular education;Passive range of motion;Therapeutic activities;Patient/family education;DME and/or AE instruction    Consulted and Agree with Plan of Care Patient             Patient will benefit from skilled therapeutic intervention in order to improve the following deficits and impairments:           Visit Diagnosis: Muscle weakness (generalized)    Problem List Patient Active Problem List   Diagnosis Date Noted   Arthritis of right shoulder region    S/P reverse total shoulder  arthroplasty, 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   Essential hypertension 07/02/2020   Prediabetes 07/02/2020   Polyuria 07/02/2020   Nontraumatic complete tear of right rotator cuff    Impingement syndrome of right shoulder    Neuropathy 12/05/2019   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[place]) 03/07/2019   Seizures ([place]) 01/31/2019   Meningioma, recurrent of brain ([place]) 01/31/2019   Meningioma ([place]) 01/03/2019   Diffuse idiopathic skeletal hyperostosis 06/05/2013    Harrel Carina, MS, OTR/L 02/02/2021, 5:30 PM  [place] [address] [place], [place], [address] Phone: [phone]   Fax:  [phone]  Name: Joshua [name] MRN: [id number] Date of Birth: 11-09-1960

## 2021-02-05 ENCOUNTER — Telehealth: Payer: Self-pay | Admitting: Family Medicine

## 2021-02-05 DIAGNOSIS — L602 Onychogryphosis: Secondary | ICD-10-CM

## 2021-02-05 NOTE — Telephone Encounter (Signed)
Pt requesting referral to podiatry for toe nail clipping.

## 2021-02-05 NOTE — Telephone Encounter (Signed)
Pt is calling to see if dr Margarita Rana would put in a referral for him to see podiatrist due to pre-diabetes and has a limited range of motion due to shoulder surgery. Pt need his toenails cut. Pt was offered an appt for 02-18-2021 and states that appt is to far and request to see want dr Margarita Rana recommending

## 2021-02-06 NOTE — Telephone Encounter (Signed)
Done

## 2021-02-08 ENCOUNTER — Other Ambulatory Visit: Payer: Self-pay

## 2021-02-08 ENCOUNTER — Emergency Department: Payer: Medicaid Other

## 2021-02-08 DIAGNOSIS — Z7984 Long term (current) use of oral hypoglycemic drugs: Secondary | ICD-10-CM | POA: Insufficient documentation

## 2021-02-08 DIAGNOSIS — Z85841 Personal history of malignant neoplasm of brain: Secondary | ICD-10-CM | POA: Diagnosis not present

## 2021-02-08 DIAGNOSIS — Z87891 Personal history of nicotine dependence: Secondary | ICD-10-CM | POA: Diagnosis not present

## 2021-02-08 DIAGNOSIS — Z79899 Other long term (current) drug therapy: Secondary | ICD-10-CM | POA: Diagnosis not present

## 2021-02-08 DIAGNOSIS — H538 Other visual disturbances: Secondary | ICD-10-CM | POA: Insufficient documentation

## 2021-02-08 DIAGNOSIS — Z96641 Presence of right artificial hip joint: Secondary | ICD-10-CM | POA: Insufficient documentation

## 2021-02-08 DIAGNOSIS — Z7982 Long term (current) use of aspirin: Secondary | ICD-10-CM | POA: Diagnosis not present

## 2021-02-08 DIAGNOSIS — H539 Unspecified visual disturbance: Secondary | ICD-10-CM | POA: Diagnosis not present

## 2021-02-08 DIAGNOSIS — I10 Essential (primary) hypertension: Secondary | ICD-10-CM | POA: Diagnosis not present

## 2021-02-08 DIAGNOSIS — R519 Headache, unspecified: Secondary | ICD-10-CM | POA: Insufficient documentation

## 2021-02-08 DIAGNOSIS — R7303 Prediabetes: Secondary | ICD-10-CM | POA: Insufficient documentation

## 2021-02-08 DIAGNOSIS — H5710 Ocular pain, unspecified eye: Secondary | ICD-10-CM | POA: Diagnosis not present

## 2021-02-08 DIAGNOSIS — H5789 Other specified disorders of eye and adnexa: Secondary | ICD-10-CM | POA: Diagnosis present

## 2021-02-08 NOTE — ED Triage Notes (Signed)
Pt states pressure behind both eyes that started this past saturday. Pt states it jumps between the right and left. Pt states his vision is foggy. Pt states he had a tumor in his brain last year with the same symptoms. Pt states he does have a neurologist. Pt states they were to get the tumor out and had radiation as well.

## 2021-02-08 NOTE — ED Notes (Signed)
Dr. Ellender Hose made aware and order obtained for CT head without contrast.

## 2021-02-09 ENCOUNTER — Emergency Department
Admission: EM | Admit: 2021-02-09 | Discharge: 2021-02-09 | Disposition: A | Discharge status: Home / Self Care | Payer: Medicaid Other | Attending: Emergency Medicine | Admitting: Emergency Medicine

## 2021-02-09 DIAGNOSIS — R519 Headache, unspecified: Secondary | ICD-10-CM | POA: Diagnosis not present

## 2021-02-09 DIAGNOSIS — H539 Unspecified visual disturbance: Secondary | ICD-10-CM | POA: Diagnosis not present

## 2021-02-09 NOTE — ED Provider Notes (Signed)
[facility] Emergency Department Provider Note  ____________________________________________   Event Date/Time   First MD Initiated Contact with Patient 02/09/21 [phone]     (approximate)  I have reviewed the triage vital signs and the nursing notes.   HISTORY  Chief Complaint Eye Pain   HPI Joshua [name] is a 60 y.o. male with a past medical history of HTN, HDL, recurrent meningioma status postradiation and resection as well as anxiety and seizures who presents for assessment of some pressure behind his eyes and some intermittent episodes lasting a few minutes of some foggy vision that he states began 7/23.  Patient states he does not have any significant head pain but feels more pressure behind his eyes.  He states he was worried because this is how he felt before his last meningioma return.  He states he does not particularly have blurry vision but that will feel "foggy".  He denies any painful vision or double vision.  He denies any vertigo, earache, sore throat, neck pain, chest pain, cough, shortness of breath, [place]nell pain, nausea, vomiting, [place] dysuria, rash or any other acute symptoms including acute weakness numbness or tingling.  He does not wear contacts or glasses.  No history of recent trauma.         Past Medical History:  Diagnosis Date   Anxiety    Brain tumor ([place]) 02/20/2013   brain tumor removed in March 2014, Dr Donald Pore   Diffuse idiopathic skeletal hyperostosis 06/05/2013   Dizziness    Enlarged prostate    GERD (gastroesophageal reflux disease)    Headache(784.0)    scattered   High cholesterol    Hypertension    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[place]) 03/07/2019   Meningioma ([place])    Meningioma, recurrent of brain ([place]) 01/31/2019   Neuropathy 12/05/2019   Pre-diabetes    Rotator cuff tear    right   Seizures ([place])    11/27/19 last sz 1 wk ago    Patient Active Problem List   Diagnosis Date Noted   Arthritis  of right shoulder region    S/P reverse total shoulder arthroplasty, 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   Essential hypertension 07/02/2020   Prediabetes 07/02/2020   Polyuria 07/02/2020   Nontraumatic complete tear of right rotator cuff    Impingement syndrome of right shoulder    Neuropathy 12/05/2019   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[place]) 03/07/2019   Seizures ([place]) 01/31/2019   Meningioma, recurrent of brain ([place]) 01/31/2019   Meningioma ([place]) 01/03/2019   Diffuse idiopathic skeletal hyperostosis 06/05/2013    Past Surgical History:  Procedure Laterality Date   APPLICATION OF CRANIAL NAVIGATION N/A 01/10/2019   Procedure: APPLICATION OF CRANIAL NAVIGATION;  Surgeon: Erline Levine, MD;  Location: [place];  Service: Neurosurgery;  Laterality: N/A;   CATARACT EXTRACTION Right    CRANIOTOMY Right 11/06/2012   Procedure: CRANIOTOMY TUMOR EXCISION;  Surgeon: Erline Levine, MD;  Location: [place] NEURO ORS;  Service: Neurosurgery;  Laterality: Right;  Right Parasagittal craniotomy for meningioma with Stealth   CRANIOTOMY Right 01/10/2019   Procedure: Right Parasagittal Craniotomy for Tumor;  Surgeon: Erline Levine, MD;  Location: [place];  Service: Neurosurgery;  Laterality: Right;  Right parasagittal craniotomy for tumor   ESOPHAGOGASTRODUODENOSCOPY     JOINT REPLACEMENT Right 2018   right hip   REVERSE SHOULDER ARTHROPLASTY Right 01/08/2021   Procedure: RIGHT REVERSE SHOULDER ARTHROPLASTY;  Surgeon: Meredith Pel, MD;  Location: [place];  Service: Orthopedics;  Laterality:  Right;   right knee arthroscopy     SHOULDER ARTHROSCOPY Right 06/09/2020   Procedure: RIGHT SHOULDER ARTHROSCOPY AND DEBRIDEMENT;  Surgeon: Newt Minion, MD;  Location: [place];  Service: Orthopedics;  Laterality: Right;    Prior to Admission medications   Medication Sig Start Date End Date Taking? Authorizing Provider  Accu-Chek Softclix Lancets lancets Use as instructed 07/08/20   Mayers, Cari S, PA-C   acetaminophen (TYLENOL) 500 MG tablet Take 500 mg by mouth every 6 (six) hours as needed for moderate pain or headache.    [provider]  albuterol (VENTOLIN HFA) 108 (90 Base) MCG/ACT inhaler Inhale 2 puffs into the lungs every 6 (six) hours as needed for wheezing or shortness of breath. 09/06/20   Merlyn Lot, MD  amLODipine (NORVASC) 5 MG tablet Take 1 tablet (5 mg total) by mouth daily. To lower blood pressure 10/05/20   Charlott Rakes, MD  aspirin 81 MG chewable tablet Chew 81 mg by mouth daily.    [provider]  atorvastatin (LIPITOR) 20 MG tablet TAKE 1 TABLET BY MOUTH EVERY DAY 01/12/21   Charlott Rakes, MD  bisacodyl (DULCOLAX) 5 MG EC tablet Take 5 mg by mouth daily as needed for moderate constipation.    [provider]  Blood Glucose Monitoring Suppl (ACCU-CHEK GUIDE ME) w/Device KIT Use to check blood sugar once daily. 01/07/21   Charlott Rakes, MD  clonazePAM (KLONOPIN) 0.5 MG tablet Take 1 tablet (0.5 mg total) by mouth 2 (two) times daily. 12/09/20   [name], MD  docusate sodium (COLACE) 100 MG capsule Take 1 capsule (100 mg total) by mouth 2 (two) times daily. 01/09/21   Meredith Pel, MD  glucose blood (ACCU-CHEK GUIDE) test strip Use to check blood sugar once daily. 01/07/21   Charlott Rakes, MD  Lacosamide (VIMPAT) 100 MG TABS TAKE 2 TABLETS IN MORNING AND 2 AT BEDTIME 12/01/20   Vaslow, Acey Lav, MD  lamoTRIgine (LAMICTAL) 100 MG tablet TAKE 2 TABLETS BY MOUTH TWICE A DAY 01/26/21   Vaslow, Acey Lav, MD  metFORMIN (GLUCOPHAGE) 500 MG tablet Take 1 tablet by mouth 2 (two) times daily. 11/12/20   [provider]  methocarbamol (ROBAXIN) 500 MG tablet Take 1 tablet (500 mg total) by mouth every 8 (eight) hours as needed for muscle spasms. 01/09/21   Meredith Pel, MD  Misc. Devices MISC Please provide BP cuff for patient 07/08/20   Argentina Donovan, PA-C  oxyCODONE (OXY IR/ROXICODONE) 5 MG immediate release tablet Take  1 tablet (5 mg total) by mouth every 8 (eight) hours. 01/26/21   Magnant, Charles L, PA-C  pantoprazole (PROTONIX) 20 MG tablet Take 1 tablet (20 mg total) by mouth 2 (two) times daily before a meal. 6/62/94 7/65/46  Jodi Marble, MD  tamsulosin (FLOMAX) 0.4 MG CAPS capsule Take 1 capsule (0.4 mg total) by mouth daily. 07/23/20   Billey Co, MD  VITAMIN A PO Take 1 capsule by mouth daily.    [provider]    Allergies Patient has no known allergies.  Family History  Problem Relation Age of Onset   Hypertension Mother    Dementia Mother    Diabetes Mother    Hypertension Father    CAD Father    Diabetes Father    CAD Brother     Social History Social History   Tobacco Use   Smoking status: Former    Types: Cigarettes    Quit date:  01/29/2019    Years since quitting: 2.0   Smokeless tobacco: Never   Tobacco comments:    weekend smoker  Vaping Use   Vaping Use: Never used  Substance Use Topics   Alcohol use: Not Currently    Comment: social   Drug use: No    Review of Systems  Review of Systems  Constitutional:  Negative for chills and fever.  HENT:  Negative for sore throat.   Eyes:  Negative for pain. Blurred vision: "not blurry but intermittently foggy". Respiratory:  Negative for cough and stridor.   Cardiovascular:  Negative for chest pain.  Gastrointestinal:  Negative for vomiting.  Genitourinary:  Negative for dysuria.  Musculoskeletal:  Negative for myalgias.  Skin:  Negative for rash.  Neurological:  Positive for headaches (pressure behind eyes). Negative for seizures and loss of consciousness.  Psychiatric/Behavioral:  Negative for suicidal ideas.   All other systems reviewed and are negative.    ____________________________________________   PHYSICAL EXAM:  VITAL SIGNS: ED Triage Vitals  Enc Vitals Group     BP 02/08/21 2034 113/83     Pulse Rate 02/08/21 2034 80     Resp 02/08/21 2034 18     Temp 02/08/21 2034 98.4 F (36.9  C)     Temp Source 02/08/21 2034 Oral     SpO2 02/08/21 2034 97 %     Weight 02/08/21 2034 180 lb (81.6 kg)     Height 02/08/21 2034 5' 8"  (1.727 m)     Head Circumference --      Peak Flow --      Pain Score 02/08/21 2040 8     Pain Loc --      Pain Edu? --      Excl. in [place]? --    Vitals:   02/08/21 2034 02/09/21 0121  BP: 113/83 112/81  Pulse: 80 61  Resp: 18 16  Temp: 98.4 F (36.9 C) 98.5 F (36.9 C)  SpO2: 97% 98%   Physical Exam Vitals and nursing note reviewed.  Constitutional:      Appearance: He is well-developed.  HENT:     Head: Normocephalic and atraumatic.     Right Ear: External ear normal.     Left Ear: External ear normal.     Nose: Nose normal.  Eyes:     Conjunctiva/sclera: Conjunctivae normal.  Cardiovascular:     Rate and Rhythm: Normal rate and regular rhythm.     Heart sounds: No murmur heard. Pulmonary:     Effort: Pulmonary effort is normal. No respiratory distress.     Breath sounds: Normal breath sounds.  Abdominal:     Palpations: Abdomen is soft.     Tenderness: There is no abdominal tenderness.  Musculoskeletal:     Cervical back: Neck supple.  Skin:    General: Skin is warm and dry.     Capillary Refill: Capillary refill takes less than 2 seconds.  Neurological:     Mental Status: He is alert and oriented to person, place, and time.    Cranial nerves II through XII grossly intact.  No pronator drift.  No finger dysmetria.  Symmetric 5/5 strength bilateral upper extremities and throughout the right lower extremity but 4/5 in the left lower extremity which he states is chronic.  Sensation intact to light touch in all extremities.  Unremarkable unassisted gait.  __Visual acuity is 20/40 bilaterally.  Pupils are symmetric and reactive to light bilaterally.  Sclera are unremarkable.  __________________________________________  LABS (all labs ordered are listed, but only abnormal results are displayed)  Labs Reviewed - No data to  display ____________________________________________  EKG  ____________________________________________  RADIOLOGY  ED MD interpretation: CT head shows no clear acute intracranial abnormality or new mass.  Stable chronic postop changes consistent with frontoparietal craniotomy and meningioma resection.  Official radiology report(s): CT Head Wo Contrast  Result Date: 02/08/2021 CLINICAL DATA:  History of prior meningioma resection, presenting with pressure behind both eyes. EXAM: CT HEAD WITHOUT CONTRAST TECHNIQUE: Contiguous axial images were obtained from the base of the skull through the vertex without intravenous contrast. COMPARISON:  December 16, 2020 FINDINGS: Brain: No evidence of acute infarction, hemorrhage, hydrocephalus, extra-axial collection or mass lesion/mass effect. Stable areas of white matter low attenuation are seen along the frontal parietal convexities, right greater than left. Vascular: No hyperdense vessel or unexpected calcification. Skull: A craniotomy defect is again seen along the anterior aspect of the vertex. Sinuses/Orbits: No acute finding. Other: None. IMPRESSION: 1. No acute intracranial abnormality. 2. Stable chronic and postoperative changes consistent with history of frontoparietal craniotomy and meningioma resection. Electronically Signed   By: Virgina Norfolk M.D.   On: 02/08/2021 21:32    ____________________________________________   PROCEDURES  Procedure(s) performed (including Critical Care):  Procedures   ____________________________________________   INITIAL IMPRESSION / ASSESSMENT AND PLAN / ED COURSE      Presents with above to history exam prior sensation of pressure behind his eyes and some intermittent episodes of foggy vision.  On arrival he is afebrile and hemodynamically stable.  He states his primary concern is recurrence of his meningioma.  He does not have any focal deficits that are acute with some residual weakness from prior  brain mass noted on left lower extremity.  He has symmetric visual acuity with unremarkable pupils.  Findings are not consistent with acute CVA.  No evidence on history or exam of deep space infection in the head or neck  CT head shows no clear acute intracranial abnormality or new mass.  Stable chronic postop changes consistent with frontoparietal craniotomy and meningioma resection.  Patient has no visual pain or mydriasis to suggest angle-closure glaucoma and overall description is not consistent with a retinal detachment.  Unclear etiology for symptoms at this time although given duration over several days without objective focal deficit and CT head without new mass I think patient is stable for discharge with plan for close outpatient oncology PCP and Ortho follow-up.  He is amenable this plan.  Discharged stable condition.  Strict return precautions advised and discussed      ____________________________________________   FINAL CLINICAL IMPRESSION(S) / ED DIAGNOSES  Final diagnoses:  Vision changes  Nonintractable headache, unspecified chronicity pattern, unspecified headache type    Medications - No data to display   ED Discharge Orders     None        Note:  This document was prepared using Dragon voice recognition software and may include unintentional dictation errors.    Lucrezia Starch, MD 02/09/21 [phone]

## 2021-02-10 ENCOUNTER — Other Ambulatory Visit: Payer: Self-pay | Admitting: Internal Medicine

## 2021-02-10 ENCOUNTER — Other Ambulatory Visit: Payer: Self-pay

## 2021-02-10 ENCOUNTER — Ambulatory Visit: Payer: Medicaid Other | Admitting: Occupational Therapy

## 2021-02-10 ENCOUNTER — Ambulatory Visit: Payer: Medicaid Other

## 2021-02-10 DIAGNOSIS — R2681 Unsteadiness on feet: Secondary | ICD-10-CM | POA: Diagnosis not present

## 2021-02-10 DIAGNOSIS — M6281 Muscle weakness (generalized): Secondary | ICD-10-CM | POA: Diagnosis not present

## 2021-02-10 DIAGNOSIS — R2689 Other abnormalities of gait and mobility: Secondary | ICD-10-CM

## 2021-02-10 DIAGNOSIS — M25511 Pain in right shoulder: Secondary | ICD-10-CM | POA: Diagnosis not present

## 2021-02-10 DIAGNOSIS — G8929 Other chronic pain: Secondary | ICD-10-CM | POA: Diagnosis not present

## 2021-02-10 DIAGNOSIS — R278 Other lack of coordination: Secondary | ICD-10-CM | POA: Diagnosis not present

## 2021-02-10 NOTE — Therapy (Signed)
[place] MAIN [facility] SERVICES [address] [place], [place], [address] Phone: [phone]   Fax:  [phone]  Occupational Therapy Treatment  Patient Details  Name: Joshua [name] MRN: [id number] Date of Birth: 01-26-1961 No data recorded  Encounter Date: 02/10/2021   OT End of Session - 02/10/21 1604     Visit Number 3    Number of Visits 12    Date for OT Re-Evaluation 04/20/21    Authorization Time Period Progress report period starting  01/28/2021    OT Start Time 46    OT Stop Time 1645    OT Time Calculation (min) 45 min    Activity Tolerance Patient tolerated treatment well    Behavior During Therapy [facility] for tasks assessed/performed             Past Medical History:  Diagnosis Date   Anxiety    Brain tumor ([place]) 02/20/2013   brain tumor removed in March 2014, Dr Donald Pore   Diffuse idiopathic skeletal hyperostosis 06/05/2013   Dizziness    Enlarged prostate    GERD (gastroesophageal reflux disease)    Headache(784.0)    scattered   High cholesterol    Hypertension    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[place]) 03/07/2019   Meningioma ([place])    Meningioma, recurrent of brain ([place]) 01/31/2019   Neuropathy 12/05/2019   Pre-diabetes    Rotator cuff tear    right   Seizures ([place])    11/27/19 last sz 1 wk ago    Past Surgical History:  Procedure Laterality Date   APPLICATION OF CRANIAL NAVIGATION N/A 01/10/2019   Procedure: APPLICATION OF CRANIAL NAVIGATION;  Surgeon: Erline Levine, MD;  Location: [place];  Service: Neurosurgery;  Laterality: N/A;   CATARACT EXTRACTION Right    CRANIOTOMY Right 11/06/2012   Procedure: CRANIOTOMY TUMOR EXCISION;  Surgeon: Erline Levine, MD;  Location: [place] NEURO ORS;  Service: Neurosurgery;  Laterality: Right;  Right Parasagittal craniotomy for meningioma with Stealth   CRANIOTOMY Right 01/10/2019   Procedure: Right Parasagittal Craniotomy for Tumor;  Surgeon: Erline Levine, MD;   Location: [place];  Service: Neurosurgery;  Laterality: Right;  Right parasagittal craniotomy for tumor   ESOPHAGOGASTRODUODENOSCOPY     JOINT REPLACEMENT Right 2018   right hip   REVERSE SHOULDER ARTHROPLASTY Right 01/08/2021   Procedure: RIGHT REVERSE SHOULDER ARTHROPLASTY;  Surgeon: Meredith Pel, MD;  Location: [place];  Service: Orthopedics;  Laterality: Right;   right knee arthroscopy     SHOULDER ARTHROSCOPY Right 06/09/2020   Procedure: RIGHT SHOULDER ARTHROSCOPY AND DEBRIDEMENT;  Surgeon: Newt Minion, MD;  Location: [place];  Service: Orthopedics;  Laterality: Right;    There were no vitals filed for this visit.   Subjective Assessment - 02/10/21 1602     Subjective  Pt reports stiffness in R shoulder, states he has been icing and heating it.    Pertinent History Pt. is a 60 y.o. male who had a Right TSA secondary to Right Rotator Cuff Arthropathy, and arthritis. Pt.'s PMHx includes: Meningioma with resection x's 2. The first resection occurred 7 years ago, and the 2nd one occurred 1 year ago. Pt. has a history of GERD, Anxiety, Left foot drop, HTN, Neuropathy, and seizures. Pt. resides in a group home, and has assist as needed from neighbors. Pt. enjoys fishing.    Limitations Right shoulder restrictions    Patient Stated Goals To improve his Right UE, and balance.  Currently in Pain? Yes    Pain Score 1     Pain Location Shoulder    Pain Orientation Right    Pain Descriptors / Indicators Aching    Pain Type Acute pain    Pain Onset 1 to 4 weeks ago    Pain Frequency Constant              Therapeutic Exercise Pt performed AROM for right shoulder abduction, shoulder flexion, and external rotation in sitting. Pt performed isometric shoulder flexion/abduction in sitting. Pt tolerated 3 sets x 15 reps each with cues for technique and rest breaks.    Self Care Pt reported difficulty entering and exiting bed while maintaining shoulder ROM  precautions. Pt instructed in safe positioning for sleep including pillows to support RUE, using LUE on edge of bed, head board use, and grippy socks. Pt demonstrated safe bed exit/entry from L side of bed while maintaining precautions, unable to complete safely on R side.                    OT Education - 02/10/21 1604     Education Details HEP    Person(s) Educated Patient    Methods Explanation;Demonstration    Comprehension Verbalized understanding;Returned demonstration              OT Short Term Goals - 01/28/21 1654       OT SHORT TERM GOAL #1   Title Pt. will demonstrate Independence with with HEPs    Baseline Eval: Assess ongoing need for HEPs.    Time 12    Period Weeks    Status New    Target Date 04/20/21               OT Long Term Goals - 01/28/21 1656       OT LONG TERM GOAL #1   Title Pt. will improve RUE shoulder flexion within RTSA protocol parameters to be able to independently reach out to assist with donning pants over feet    Baseline Eval: Pt has difficulty secondary to limited right shoulder flexion.    Time 12    Period Weeks    Status New    Target Date 04/20/21      OT LONG TERM GOAL #2   Title Pt. will mprove right shoulder abduction within RTSA protocol parameters in preparation for independently reaching up to complete hair care.    Baseline Eval: Pt. has difficulty secondary to limited right shoulder Abduction.    Time 12    Period Weeks    Status New    Target Date 04/20/21      OT LONG TERM GOAL #3   Title Pt. will independently be able to use his RUE to apply deoderant thoroughly to the left underarm.    Baseline Eval: Pt. has difficulty secondary to limited RUE    Time 12    Period Weeks    Status New    Target Date 04/20/21      OT LONG TERM GOAL #4   Title Pt. will improve FOTO score by 2 points for clinically relavant functional change for the specified ADL/IADLs    Baseline Eval: FOTO score: 45     Time 12    Period Weeks    Status New    Target Date 04/20/21                   Plan - 02/10/21 1604     Clinical  Impression Statement Pt. presents with 1/10 pain in the right shoulder. Pt. tolerated ROM in sitting with cues and rest breaks, demonstrating tolerance od increased reps. Pt reporting difficulty maintaining precautions with bed entry/exit. Instructed on safe methods and pt return demonstrated safely for R side bed entry/exit. Plan to continue working towards improving ROM in order to improve functional engagement of the RUE during ADLs, and IADL tasks.    Occupational performance deficits (Please refer to evaluation for details): ADL's;IADL's    Rehab Potential Fair    Clinical Decision Making Several treatment options, min-mod task modification necessary    Comorbidities Affecting Occupational Performance: May have comorbidities impacting occupational performance    Modification or Assistance to Complete Evaluation  Min-Moderate modification of tasks or assist with assess necessary to complete eval    OT Frequency 2x / week    OT Duration 12 weeks    OT Treatment/Interventions Self-care/ADL training;Therapeutic exercise;Neuromuscular education;Passive range of motion;Therapeutic activities;Patient/family education;DME and/or AE instruction    Consulted and Agree with Plan of Care Patient             Patient will benefit from skilled therapeutic intervention in order to improve the following deficits and impairments:           Visit Diagnosis: Muscle weakness (generalized)    Problem List Patient Active Problem List   Diagnosis Date Noted   Arthritis of right shoulder region    S/P reverse total shoulder arthroplasty, 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   Essential hypertension 07/02/2020   Prediabetes 07/02/2020   Polyuria 07/02/2020   Nontraumatic complete tear of right rotator cuff    Impingement syndrome of right shoulder    Neuropathy 12/05/2019   Malignant  meningioma of meninges of brain ([place]) 03/07/2019   Seizures ([place]) 01/31/2019   Meningioma, recurrent of brain ([place]) 01/31/2019   Meningioma ([place]) 01/03/2019   Diffuse idiopathic skeletal hyperostosis 06/05/2013    Dessie Coma, M.S. OTR/L  02/10/21, 4:58 PM  ascom [phone]   [place] MAIN [facility] SERVICES [address] [place], [place], [address] Phone: [phone]   Fax:  [phone]  Name: Joshua [name] MRN: [id number] Date of Birth: [birth year]

## 2021-02-10 NOTE — Therapy (Signed)
[place] MAIN [facility] SERVICES [address] [place], [place], [address] Phone: [phone]   Fax:  [phone]  Physical Therapy Treatment  Patient Details  Name: Joshua [name] MRN: [id number] Date of Birth: 01-Oct-1960 Referring Provider (PT): Charlott Rakes, MD   Encounter Date: 02/10/2021   PT End of Session - 02/10/21 1611     Visit Number 3    Number of Visits 25    Date for PT Re-Evaluation 04/22/21    Authorization Type eval completed [id number], cert for [id number]    PT Start Time 1517    PT Stop Time 1600    PT Time Calculation (min) 43 min    Equipment Utilized During Treatment Gait belt    Activity Tolerance Patient tolerated treatment well    Behavior During Therapy [facility] for tasks assessed/performed             Past Medical History:  Diagnosis Date   Anxiety    Brain tumor ([place]) 02/20/2013   brain tumor removed in March 2014, Dr Donald Pore   Diffuse idiopathic skeletal hyperostosis 06/05/2013   Dizziness    Enlarged prostate    GERD (gastroesophageal reflux disease)    Headache(784.0)    scattered   High cholesterol    Hypertension    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[place]) 03/07/2019   Meningioma ([place])    Meningioma, recurrent of brain ([place]) 01/31/2019   Neuropathy 12/05/2019   Pre-diabetes    Rotator cuff tear    right   Seizures ([place])    11/27/19 last sz 1 wk ago    Past Surgical History:  Procedure Laterality Date   APPLICATION OF CRANIAL NAVIGATION N/A 01/10/2019   Procedure: APPLICATION OF CRANIAL NAVIGATION;  Surgeon: Erline Levine, MD;  Location: [place];  Service: Neurosurgery;  Laterality: N/A;   CATARACT EXTRACTION Right    CRANIOTOMY Right 11/06/2012   Procedure: CRANIOTOMY TUMOR EXCISION;  Surgeon: Erline Levine, MD;  Location: [place] NEURO ORS;  Service: Neurosurgery;  Laterality: Right;  Right Parasagittal craniotomy for meningioma with Stealth   CRANIOTOMY Right 01/10/2019   Procedure:  Right Parasagittal Craniotomy for Tumor;  Surgeon: Erline Levine, MD;  Location: [place];  Service: Neurosurgery;  Laterality: Right;  Right parasagittal craniotomy for tumor   ESOPHAGOGASTRODUODENOSCOPY     JOINT REPLACEMENT Right 2018   right hip   REVERSE SHOULDER ARTHROPLASTY Right 01/08/2021   Procedure: RIGHT REVERSE SHOULDER ARTHROPLASTY;  Surgeon: Meredith Pel, MD;  Location: [place];  Service: Orthopedics;  Laterality: Right;   right knee arthroscopy     SHOULDER ARTHROSCOPY Right 06/09/2020   Procedure: RIGHT SHOULDER ARTHROSCOPY AND DEBRIDEMENT;  Surgeon: Newt Minion, MD;  Location: [place];  Service: Orthopedics;  Laterality: Right;    There were no vitals filed for this visit.   Subjective Assessment - 02/10/21 1517     Subjective Pt reports R UE pain is currently 6/10. Pt reports his legs feel "tight." Pt reports difficulty with balance when walking over rug in his bathroom. Pt reports he thinks he's getting about 3 hours of sleep per night. He says medication is making him thirsty and waking him up as well as trying not to sleep on his R arm.    Pertinent History Pt presents to PT using SPC following OT evaluation for R TSA. Pt is presenting to PT due to difficulty with gait, balance and LE strength. Pt reports he as attended neuro rehab before following second  brain surgery (2021). He reports he did PT for about a month. Pt saw functional improvements but reports his legs were "so weak they just lost the strength in them." Pt would like to strengthen his legs so he won't "be dragging them." Pt states, "my right is pretty much my strong leg." Due to pt's recent R rTSA he is unable to rely on UEs as much to maintain precautions. Pt says he has LLE brace but doesn't wear it because it was not comfortable. He reports no pain in legs currently. Pt does report increased L foot "sitffness." Pt reports no falls, but says he has had multiple near-falls.PMH includeds R  TSA (01/08/2021) secondary to R Rotator Cuff Arthropathy and arthritis, meningioma with resection x's 2, where first resection occurred 7 years ago, and 2nd resection 1 year ago.Other PMH includes GERD, Anxiety, L foot drop, HTN, Neuropathy, and seizures.    Limitations Lifting;Standing;Walking;House hold activities   lifting: d/t to recent rTSA; difficulty getting in and out of the tub   How long can you sit comfortably? unlimited    How long can you stand comfortably? reports limited primarily d/t L LE stiffness, difficulty picking up L foot    How long can you walk comfortably? Reports can walk about 100 yards, reports easily fatigued with gait, states "I start to lean forward a lot."    Diagnostic tests 12/16/20 CT HEAD WO CONTRAST, per chart "IMPRESSION:  1. No acute intracranial findings.  2. Stable postsurgical changes of frontoparietal craniotomy and  meningioma resection."    Patient Stated Goals Pt would like to strengthen his legs and to improve his balance so he doesn't have to hold onto anything.    Currently in Pain? Yes    Pain Score 6     Pain Location Arm    Pain Orientation Right    Pain Onset 1 to 4 weeks ago              TREATMENT  Therex-  Nustep level 3 x 2 min, progressed to level 5 x 4 min. Pt rates RPE as 4-6/10. Pt able to maintain SPM>60s. Cloes CGA for mount/dismount.  Seated cable machine hamstring curls - 3x15 at 7.5# for BLEs. Pt rates more difficult to perform on LLE compared to RLE. Rates overall exercise as medium.  STS - 1x10, 1x6. Pt unable to complete final reps d/t fatigue.    Seated hamstring stretch - 30-45 sec BLEs. Addition of DF to promote gastrocsol stretch as well.  Chair-supported hamstring stretch - 30-45 sec BLEs.  PROM for LLE PF/DF with overpressure into DF for stretch - x several reps.   Neuro Re-education: In // bars with CGA throughout   Standing NBOS 2x30 sec  Standing NBOS EC 2x30 sec Standing semi-tandem BLEs 2x30 sec each  LE  Ambulation for dynamic balance with SPC in LUE x approx 150 ft. Pt with antalgic gait, increased knee-flexion throughout gait cycle, decreased step length B, decreased heel-strike B. When cued for heel strike, pt staggers, requires up to min assist to maintain balance. Attempts multiple steps with correction for gait mechanics but unable d/t increased unsteadiness.   Pt educated throughout session about proper posture and technique with exercises. Improved exercise technique, movement at target joints, use of target muscles after min to mod verbal, visual, tactile cues.    PT Short Term Goals - 01/28/21 1828       PT SHORT TERM GOAL #1   Title Pt will be independent  with HEP in order to improve strength and balance in order to decrease fall risk and improve function at home.    Baseline 7/14: to be initiated    Time 6    Period Weeks    Status New    Target Date 03/11/21      PT SHORT TERM GOAL #2   Title Pt to increase PROM/AROM of L ankle DF by at least 10 degrees to promote functional mobility    Baseline 7/14: L ankle DF lacking 5 deg from neutral    Time 6    Period Weeks    Status New    Target Date 03/11/21               PT Long Term Goals - 01/28/21 1832       PT LONG TERM GOAL #1   Title Pt will decrease TUG to below 14 seconds/decrease in order to demonstrate decreased fall risk.    Baseline 7/14: 18.83 sec    Time 12    Period Weeks    Status New    Target Date 04/22/21      PT LONG TERM GOAL #2   Title Pt will increase 6MWT by at least 89m(1676f in order to demonstrate clinically significant improvement in endurance and community ambulation .    Baseline 7/14: 610 ft with SPC    Time 12    Period Weeks    Status New    Target Date 04/22/21      PT LONG TERM GOAL #3   Title Patient will increase 10 meter walk test to >1.13m73mas to improve gait speed for better community ambulation and to reduce fall risk.    Baseline 7/14: 0.625 m/s wtih SPC in  LUE    Time 12    Period Weeks    Status New    Target Date 04/22/21      PT LONG TERM GOAL #4   Title Pt will improve DGI by at least 3 points in order to demonstrate clinically significant improvement in balance and decreased risk for falls.    Baseline 7/14: deferred to next session    Time 12    Period Weeks    Status New    Target Date 04/22/21      PT LONG TERM GOAL #5   Title Patient will increase FOTO score by at least 10 points to demonstrate an improvement in mobility and quality of life.    Baseline 7/14: deferred dt incorrect questionnaire, pt will take future session with corrected version    Time 12    Period Weeks    Status New    Target Date 04/22/21                   Plan - 02/10/21 1619     Clinical Impression Statement Focus of session on LE endurance training and static and dynamic balance. Pt reports quick onset of fatigue with therex, and reports greater fatigue in LLE compared to RLE. Pt also reports a feeling of "stiffness" in LEs throughout. The pt does exhibit increased knee flexion throughout gait cycle. PT followed-up with instructing pt through LE hamstring stretches. The pt will benefit from further skilled PT to improve LE strength, endurance, balance and gait ability to increase ease and safety wtih all functional mobility.    Personal Factors and Comorbidities Comorbidity 1;Comorbidity 2;Comorbidity 3+;Social Background;Past/Current Experience;Time since onset of injury/illness/exacerbation;Fitness    Comorbidities PMH includeds R TSA (  01/08/2021) secondary to R Rotator Cuff Arthropathy and arthritis, meningioma with resection x's 2, where first resection occurred 7 years ago, and 2nd resection 1 year ago.Other PMH includes GERD, Anxiety, L foot drop, HTN, Neuropathy, and seizures.    Examination-Activity Limitations Bathing;Bed Mobility;Bend;Carry;Dressing;Lift;Locomotion Level;Reach Overhead;Squat;Stairs;Stand;Transfers;Hygiene/Grooming     Examination-Participation Restrictions Community Activity;Meal Prep;Shop;Yard Work;Cleaning;Laundry    Stability/Clinical Decision Making Evolving/Moderate complexity    Rehab Potential Good    PT Frequency 2x / week    PT Duration 12 weeks    PT Treatment/Interventions Biofeedback;ADLs/Self Care Home Management;Cryotherapy;Electrical Stimulation;Moist Heat;DME Instruction;Gait training;Stair training;Functional mobility training;Therapeutic activities;Therapeutic exercise;Balance training;Neuromuscular re-education;Patient/family education;Orthotic Fit/Training;Manual techniques;Passive range of motion;Scar mobilization;Energy conservation;Splinting;Taping;Joint Manipulations    PT Next Visit Plan Further balance assessment, inititate strengthening, endurance exercises and HEP, dynamic balance, endurance, LE stretching    PT Home Exercise Plan Access Code: [email or web address]    Consulted and Agree with Plan of Care Patient             Patient will benefit from skilled therapeutic intervention in order to improve the following deficits and impairments:  Abnormal gait, Decreased activity tolerance, Decreased endurance, Decreased range of motion, Decreased knowledge of use of DME, Decreased strength, Hypomobility, Impaired sensation, Impaired UE functional use, Improper body mechanics, Pain, Decreased balance, Decreased coordination, Decreased mobility, Hypermobility, Impaired flexibility, Postural dysfunction  Visit Diagnosis: Other abnormalities of gait and mobility  Muscle weakness (generalized)  Unsteadiness on feet     Problem List Patient Active Problem List   Diagnosis Date Noted   Arthritis of right shoulder region    S/P reverse total shoulder arthroplasty, 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 [place], [place], [address] Phone: [phone]   Fax:  [phone]  Name: Joshua [name] MRN: [id number] Date of Birth: [birth year]

## 2021-02-11 ENCOUNTER — Other Ambulatory Visit: Payer: Self-pay | Admitting: Internal Medicine

## 2021-02-15 ENCOUNTER — Other Ambulatory Visit: Payer: Self-pay | Admitting: Internal Medicine

## 2021-02-17 ENCOUNTER — Ambulatory Visit: Payer: Medicaid Other | Attending: Family Medicine

## 2021-02-17 ENCOUNTER — Ambulatory Visit: Payer: Medicaid Other

## 2021-02-17 ENCOUNTER — Other Ambulatory Visit: Payer: Self-pay

## 2021-02-17 DIAGNOSIS — G8929 Other chronic pain: Secondary | ICD-10-CM | POA: Insufficient documentation

## 2021-02-17 DIAGNOSIS — R278 Other lack of coordination: Secondary | ICD-10-CM | POA: Diagnosis not present

## 2021-02-17 DIAGNOSIS — M25511 Pain in right shoulder: Secondary | ICD-10-CM | POA: Insufficient documentation

## 2021-02-17 DIAGNOSIS — R2689 Other abnormalities of gait and mobility: Secondary | ICD-10-CM | POA: Insufficient documentation

## 2021-02-17 DIAGNOSIS — M6281 Muscle weakness (generalized): Secondary | ICD-10-CM | POA: Diagnosis not present

## 2021-02-17 DIAGNOSIS — R2681 Unsteadiness on feet: Secondary | ICD-10-CM | POA: Insufficient documentation

## 2021-02-17 NOTE — Therapy (Signed)
[place] MAIN [facility] SERVICES [address] [place], [place], [address] Phone: [phone]   Fax:  [phone]  Occupational Therapy Treatment  Patient Details  Name: Joshua [name] MRN: [id number] Date of Birth: 12-Apr-1961 No data recorded  Encounter Date: 02/17/2021   OT End of Session - 02/17/21 1441     Visit Number 4    Number of Visits 12    Date for OT Re-Evaluation 04/20/21    Authorization Time Period Progress report period starting  01/28/2021    OT Start Time 1347    OT Stop Time 1435    OT Time Calculation (min) 48 min    Equipment Utilized During Treatment single point cane    Activity Tolerance Patient tolerated treatment well    Behavior During Therapy [facility] for tasks assessed/performed             Past Medical History:  Diagnosis Date   Anxiety    Brain tumor ([place]) 02/20/2013   brain tumor removed in March 2014, Dr Donald Pore   Diffuse idiopathic skeletal hyperostosis 06/05/2013   Dizziness    Enlarged prostate    GERD (gastroesophageal reflux disease)    Headache(784.0)    scattered   High cholesterol    Hypertension    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[place]) 03/07/2019   Meningioma ([place])    Meningioma, recurrent of brain ([place]) 01/31/2019   Neuropathy 12/05/2019   Pre-diabetes    Rotator cuff tear    right   Seizures ([place])    11/27/19 last sz 1 wk ago    Past Surgical History:  Procedure Laterality Date   APPLICATION OF CRANIAL NAVIGATION N/A 01/10/2019   Procedure: APPLICATION OF CRANIAL NAVIGATION;  Surgeon: Erline Levine, MD;  Location: [place];  Service: Neurosurgery;  Laterality: N/A;   CATARACT EXTRACTION Right    CRANIOTOMY Right 11/06/2012   Procedure: CRANIOTOMY TUMOR EXCISION;  Surgeon: Erline Levine, MD;  Location: [place] NEURO ORS;  Service: Neurosurgery;  Laterality: Right;  Right Parasagittal craniotomy for meningioma with Stealth   CRANIOTOMY Right 01/10/2019   Procedure: Right Parasagittal  Craniotomy for Tumor;  Surgeon: Erline Levine, MD;  Location: [place];  Service: Neurosurgery;  Laterality: Right;  Right parasagittal craniotomy for tumor   ESOPHAGOGASTRODUODENOSCOPY     JOINT REPLACEMENT Right 2018   right hip   REVERSE SHOULDER ARTHROPLASTY Right 01/08/2021   Procedure: RIGHT REVERSE SHOULDER ARTHROPLASTY;  Surgeon: Meredith Pel, MD;  Location: [place];  Service: Orthopedics;  Laterality: Right;   right knee arthroscopy     SHOULDER ARTHROSCOPY Right 06/09/2020   Procedure: RIGHT SHOULDER ARTHROSCOPY AND DEBRIDEMENT;  Surgeon: Newt Minion, MD;  Location: [place];  Service: Orthopedics;  Laterality: Right;    There were no vitals filed for this visit.   Subjective Assessment - 02/17/21 1439     Subjective  "I'm afraid of doing too much or too little with my arm."    Pertinent History Pt. is a 60 y.o. male who had a Right TSA secondary to Right Rotator Cuff Arthropathy, and arthritis. Pt.'s PMHx includes: Meningioma with resection x's 2. The first resection occurred 7 years ago, and the 2nd one occurred 1 year ago. Pt. has a history of GERD, Anxiety, Left foot drop, HTN, Neuropathy, and seizures. Pt. resides in a group home, and has assist as needed from neighbors. Pt. enjoys fishing.    Limitations Right shoulder restrictions    Patient Stated Goals To  improve his Right UE, and balance.    Currently in Pain? Yes    Pain Score 5     Pain Location Shoulder    Pain Orientation Right    Pain Descriptors / Indicators Aching;Tightness    Pain Type Surgical pain    Pain Radiating Towards shoulder to bicep    Pain Onset 1 to 4 weeks ago    Pain Frequency Constant    Aggravating Factors  activity    Pain Relieving Factors meds, rest    Multiple Pain Sites No            Occupational Therapy Treatment: Therapeutic Exercise: Participated in R reverse TSA exercises per MD protocol.  Performed bilat shoulder shrugs, retraction, and circles x10 each.   Performed 3 sets 10 reps AAROM for R shoulder flex 0-110, abd 0-90, ER 0-30 with good tolerance.  Instructed pt in cane stretches supine on mat for bilat shoulder flex, abd, and ER limited to 30 degrees x 2 sets 10 reps each.  In sitting, performed R shoulder isometrics for shoulder flex/abd/ER with cues for positioning to keep R arm at side; instructed pt to use L hand for blocking, or pillow up against wall or arm rest of chair.  Reinforced shoulder precautions for ADLs to ensure proper healing.    Response to Treatment: Pt tolerated treatment well this day, with pain in R shoulder at 5/10 to start; pt reports <5 by end of session.  Able to stretch R shoulder with cane in supine 0-125 flexion, compared to 110 degrees in sitting.  Encouraged pt complete cane stretches a.m./p.m. at home supine in bed.  Pt had questions regarding how many exercises to do, and what would be too much.  OT encouraged pt on average to perform 2-3 sets 10 reps of exercises noted above 1-2 x per day; Encouraged pt listen to his body, avoid activities which cause increased pain.  Pt will continue to benefit from skilled OT for maximizing ROM/strength in R shoulder for improving functional use of RUE during ADLs.     OT Education - 02/17/21 1441     Education Details HEP, including cane stretches in supine and isometric strengthening    Person(s) Educated Patient    Methods Explanation;Demonstration    Comprehension Verbalized understanding;Returned demonstration              OT Short Term Goals - 01/28/21 1654       OT SHORT TERM GOAL #1   Title Pt. will demonstrate Independence with with HEPs    Baseline Eval: Assess ongoing need for HEPs.    Time 12    Period Weeks    Status New    Target Date 04/20/21               OT Long Term Goals - 01/28/21 1656       OT LONG TERM GOAL #1   Title Pt. will improve RUE shoulder flexion within RTSA protocol parameters to be able to independently reach out to  assist with donning pants over feet    Baseline Eval: Pt has difficulty secondary to limited right shoulder flexion.    Time 12    Period Weeks    Status New    Target Date 04/20/21      OT LONG TERM GOAL #2   Title Pt. will mprove right shoulder abduction within RTSA protocol parameters in preparation for independently reaching up to complete hair care.    Baseline Eval: Pt.  has difficulty secondary to limited right shoulder Abduction.    Time 12    Period Weeks    Status New    Target Date 04/20/21      OT LONG TERM GOAL #3   Title Pt. will independently be able to use his RUE to apply deoderant thoroughly to the left underarm.    Baseline Eval: Pt. has difficulty secondary to limited RUE    Time 12    Period Weeks    Status New    Target Date 04/20/21      OT LONG TERM GOAL #4   Title Pt. will improve FOTO score by 2 points for clinically relavant functional change for the specified ADL/IADLs    Baseline Eval: FOTO score: 45    Time 12    Period Weeks    Status New    Target Date 04/20/21                   Plan - 02/17/21 1454     Clinical Impression Statement Pt tolerated treatment well this day, with pain in R shoulder at 5/10 to start; pt reports <5 by end of session.  Able to stretch R shoulder with cane in supine 0-125 flexion, compared to 110 degrees in sitting.  Encouraged pt complete cane stretches a.m./p.m. at home supine in bed.  Pt had questions regarding how many exercises to do, and what would be too much.  OT encouraged pt on average to perform 2-3 sets 10 reps of exercises noted above 1-2 x per day; Encouraged pt listen to his body, avoid activities which cause increased pain.  Pt will continue to benefit from skilled OT for maximizing ROM/strength in R shoulder for improving functional use of RUE during ADLs.    Occupational performance deficits (Please refer to evaluation for details): ADL's;IADL's    Rehab Potential Fair    Clinical Decision Making  Several treatment options, min-mod task modification necessary    Comorbidities Affecting Occupational Performance: May have comorbidities impacting occupational performance    Modification or Assistance to Complete Evaluation  Min-Moderate modification of tasks or assist with assess necessary to complete eval    OT Frequency 2x / week    OT Duration 12 weeks    OT Treatment/Interventions Self-care/ADL training;Therapeutic exercise;Neuromuscular education;Passive range of motion;Therapeutic activities;Patient/family education;DME and/or AE instruction    Consulted and Agree with Plan of Care Patient             Patient will benefit from skilled therapeutic intervention in order to improve the following deficits and impairments:           Visit Diagnosis: Muscle weakness (generalized)  Other lack of coordination  Chronic right shoulder pain    Problem List Patient Active Problem List   Diagnosis Date Noted   Arthritis of right shoulder region    S/P reverse total shoulder arthroplasty, 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   Essential hypertension 07/02/2020   Prediabetes 07/02/2020   Polyuria 07/02/2020   Nontraumatic complete tear of right rotator cuff    Impingement syndrome of right shoulder    Neuropathy 12/05/2019   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[place]) 03/07/2019   Seizures ([place]) 01/31/2019   Meningioma, recurrent of brain ([place]) 01/31/2019   Meningioma ([place]) 01/03/2019   Diffuse idiopathic skeletal hyperostosis 06/05/2013   Leta Speller, MS, OTR/L  Darleene Cleaver 02/17/2021, 2:55 PM  [place] MAIN [facility] SERVICES [address] [place], [place], [address] Phone: [phone]  Fax:  [phone]  Name: Joshua [name] MRN: [id number] Date of Birth: 02/24/1961

## 2021-02-17 NOTE — Therapy (Signed)
[place] MAIN [facility] SERVICES [address] [place], [place], [address] Phone: [phone]   Fax:  [phone]  Physical Therapy Treatment  Patient Details  Name: Joshua [name] MRN: [id number] Date of Birth: 20-Apr-1961 Referring Provider (PT): Charlott Rakes, MD   Encounter Date: 02/17/2021   PT End of Session - 02/17/21 1442     Visit Number 4    Number of Visits 25    Date for PT Re-Evaluation 04/22/21    Authorization Type [place] Medicaid (27 VL PT/ST/OT)    Authorization Time Period cert [id number]; [facility] Josem Kaufmann 7/19-8/8 for 3 visits)    Authorization - Visit Number 3    Authorization - Number of Visits 3    PT Start Time [id number]    PT Stop Time 1512    PT Time Calculation (min) 40 min    Equipment Utilized During Treatment Gait belt    Activity Tolerance Patient tolerated treatment well;No increased pain    Behavior During Therapy [facility] for tasks assessed/performed             Past Medical History:  Diagnosis Date   Anxiety    Brain tumor ([place]) 02/20/2013   brain tumor removed in March 2014, Dr Donald Pore   Diffuse idiopathic skeletal hyperostosis 06/05/2013   Dizziness    Enlarged prostate    GERD (gastroesophageal reflux disease)    Headache(784.0)    scattered   High cholesterol    Hypertension    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[place]) 03/07/2019   Meningioma ([place])    Meningioma, recurrent of brain ([place]) 01/31/2019   Neuropathy 12/05/2019   Pre-diabetes    Rotator cuff tear    right   Seizures ([place])    11/27/19 last sz 1 wk ago    Past Surgical History:  Procedure Laterality Date   APPLICATION OF CRANIAL NAVIGATION N/A 01/10/2019   Procedure: APPLICATION OF CRANIAL NAVIGATION;  Surgeon: Erline Levine, MD;  Location: [place];  Service: Neurosurgery;  Laterality: N/A;   CATARACT EXTRACTION Right    CRANIOTOMY Right 11/06/2012   Procedure: CRANIOTOMY TUMOR EXCISION;  Surgeon: Erline Levine, MD;  Location:  [place] NEURO ORS;  Service: Neurosurgery;  Laterality: Right;  Right Parasagittal craniotomy for meningioma with Stealth   CRANIOTOMY Right 01/10/2019   Procedure: Right Parasagittal Craniotomy for Tumor;  Surgeon: Erline Levine, MD;  Location: [place];  Service: Neurosurgery;  Laterality: Right;  Right parasagittal craniotomy for tumor   ESOPHAGOGASTRODUODENOSCOPY     JOINT REPLACEMENT Right 2018   right hip   REVERSE SHOULDER ARTHROPLASTY Right 01/08/2021   Procedure: RIGHT REVERSE SHOULDER ARTHROPLASTY;  Surgeon: Meredith Pel, MD;  Location: [place];  Service: Orthopedics;  Laterality: Right;   right knee arthroscopy     SHOULDER ARTHROSCOPY Right 06/09/2020   Procedure: RIGHT SHOULDER ARTHROSCOPY AND DEBRIDEMENT;  Surgeon: Newt Minion, MD;  Location: [place];  Service: Orthopedics;  Laterality: Right;    There were no vitals filed for this visit.   Subjective Assessment - 02/17/21 1440     Subjective Pt reports no updates today. Sinc eprior PT visit, no falls, no meds changes. HEP is going well. Pt really wants to progress his strength training. Pt denies any recurrence of HA phenomenon akin to what brought him to ED on 7/27.    Pertinent History Pt presents to PT using SPC following OT evaluation for R TSA. Pt is presenting to PT due to difficulty with gait, balance  and LE strength. Pt reports he as attended neuro rehab before following second brain surgery (2021). He reports he did PT for about a month. Pt saw functional improvements but reports his legs were "so weak they just lost the strength in them." Pt would like to strengthen his legs so he won't "be dragging them." Pt states, "my right is pretty much my strong leg." Due to pt's recent R rTSA he is unable to rely on UEs as much to maintain precautions. Pt says he has LLE brace but doesn't wear it because it was not comfortable. He reports no pain in legs currently. Pt does report increased L foot "sitffness." Pt  reports no falls, but says he has had multiple near-falls.PMH includeds R TSA (01/08/2021) secondary to R Rotator Cuff Arthropathy and arthritis, meningioma with resection x's 2, where first resection occurred 7 years ago, and 2nd resection 1 year ago.Other PMH includes GERD, Anxiety, L foot drop, HTN, Neuropathy, and seizures.    Currently in Pain? No/denies   Some operative shoulder pain prir to arrival, since OT now better             INTERVENTION THIS DATE: -Nustep, Level 5 x6 minutes, AA/ROM Left ankle DF, LUE used, RUE not used due to rTSA precautions  - overground motor control/gait training, no device at minGuard assist 69f  -Education on setup/DME use for enter/exit bathtub for showering at homw. Experiemented with a new RW placement straddlingedge of tub on Left side of patient 5x at minGuard assist, difficulty with Left foot clearnance over tub -Lateral LLE step taps on edge of tub (from inside tub) 1x10, minA for full ROM -Medial LLE step taps on edge of tub (from inside tub) 1x10, minA for full ROM -overground motor control/gait training, no device at minGuard assist 547f -Seated hamstrings cable resistance 7.5lb 2x15 bilat -STS from chair + airex 1x12 -STS from chair +airex, + airex under Rt foot 1x12 (minGuardA for safety, no LOB)  *Pt requires intermittent rest breaks after exertional actiivty, recovery within 2-3 minutes.        PT Education - 02/17/21 1441     Education Details continued education on exercise form, safety at home.    Person(s) Educated Patient    Methods Explanation;Demonstration    Comprehension Verbalized understanding              PT Short Term Goals - 01/28/21 1828       PT SHORT TERM GOAL #1   Title Pt will be independent with HEP in order to improve strength and balance in order to decrease fall risk and improve function at home.    Baseline 7/14: to be initiated    Time 6    Period Weeks    Status New    Target Date 03/11/21       PT SHORT TERM GOAL #2   Title Pt to increase PROM/AROM of L ankle DF by at least 10 degrees to promote functional mobility    Baseline 7/14: L ankle DF lacking 5 deg from neutral    Time 6    Period Weeks    Status New    Target Date 03/11/21               PT Long Term Goals - 01/28/21 1832       PT LONG TERM GOAL #1   Title Pt will decrease TUG to below 14 seconds/decrease in order to demonstrate decreased fall risk.  Baseline 7/14: 18.83 sec    Time 12    Period Weeks    Status New    Target Date 04/22/21      PT LONG TERM GOAL #2   Title Pt will increase 6MWT by at least 8m(1635f in order to demonstrate clinically significant improvement in endurance and community ambulation .    Baseline 7/14: 610 ft with SPC    Time 12    Period Weeks    Status New    Target Date 04/22/21      PT LONG TERM GOAL #3   Title Patient will increase 10 meter walk test to >1.36m48mas to improve gait speed for better community ambulation and to reduce fall risk.    Baseline 7/14: 0.625 m/s wtih SPC in LUE    Time 12    Period Weeks    Status New    Target Date 04/22/21      PT LONG TERM GOAL #4   Title Pt will improve DGI by at least 3 points in order to demonstrate clinically significant improvement in balance and decreased risk for falls.    Baseline 7/14: deferred to next session    Time 12    Period Weeks    Status New    Target Date 04/22/21      PT LONG TERM GOAL #5   Title Patient will increase FOTO score by at least 10 points to demonstrate an improvement in mobility and quality of life.    Baseline 7/14: deferred dt incorrect questionnaire, pt will take future session with corrected version    Time 12    Period Weeks    Status New    Target Date 04/22/21                   Plan - 02/17/21 1510     Clinical Impression Statement Continued with current plan of care as laid out in evaluation and recent prior sessions. Pt remains motivated to advance  progress toward goals. Rest breaks provided as needed, pt quick to ask when needed. Pt does require varying levels of assistance and cuing for completion of exercises for correct form and sometimes due to pain/weakness. Pt continues to demonstrate progress toward goals AEB progression of some interventions this date either in volume or intensity. Encouraged to provide feedback next session regarding tub entry technique at home.    Personal Factors and Comorbidities Comorbidity 1;Comorbidity 2;Comorbidity 3+;Social Background;Past/Current Experience;Time since onset of injury/illness/exacerbation;Fitness    Comorbidities PMH includeds R TSA (01/08/2021) secondary to R Rotator Cuff Arthropathy and arthritis, meningioma with resection x's 2, where first resection occurred 7 years ago, and 2nd resection 1 year ago.Other PMH includes GERD, Anxiety, L foot drop, HTN, Neuropathy, and seizures.    Examination-Activity Limitations Bathing;Bed Mobility;Bend;Carry;Dressing;Lift;Locomotion Level;Reach Overhead;Squat;Stairs;Stand;Transfers;Hygiene/Grooming    Examination-Participation Restrictions Community Activity;Meal Prep;Shop;Yard Work;Cleaning;Laundry    Stability/Clinical Decision Making Evolving/Moderate complexity    Clinical Decision Making Moderate    Rehab Potential Good    PT Frequency 2x / week    PT Duration 12 weeks    PT Treatment/Interventions Biofeedback;ADLs/Self Care Home Management;Cryotherapy;Electrical Stimulation;Moist Heat;DME Instruction;Gait training;Stair training;Functional mobility training;Therapeutic activities;Therapeutic exercise;Balance training;Neuromuscular re-education;Patient/family education;Orthotic Fit/Training;Manual techniques;Passive range of motion;Scar mobilization;Energy conservation;Splinting;Taping;Joint Manipulations    PT Next Visit Plan Further balance assessment, inititate strengthening, endurance exercises and HEP, dynamic balance, endurance, LE stretching    PT  Home Exercise Plan Access Code: [email or web address], no updates this date.    Consulted and Agree with  Plan of Care Patient             Patient will benefit from skilled therapeutic intervention in order to improve the following deficits and impairments:  Abnormal gait, Decreased activity tolerance, Decreased endurance, Decreased range of motion, Decreased knowledge of use of DME, Decreased strength, Hypomobility, Impaired sensation, Impaired UE functional use, Improper body mechanics, Pain, Decreased balance, Decreased coordination, Decreased mobility, Hypermobility, Impaired flexibility, Postural dysfunction  Visit Diagnosis: Other abnormalities of gait and mobility  Muscle weakness (generalized)  Unsteadiness on feet     Problem List Patient Active Problem List   Diagnosis Date Noted   Arthritis of right shoulder region    S/P reverse total shoulder arthroplasty, 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   Essential hypertension 07/02/2020   Prediabetes 07/02/2020   Polyuria 07/02/2020   Nontraumatic complete tear of right rotator cuff    Impingement syndrome of right shoulder    Neuropathy 12/05/2019   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[place]) 03/07/2019   Seizures ([place]) 01/31/2019   Meningioma, recurrent of brain ([place]) 01/31/2019   Meningioma ([place]) 01/03/2019   Diffuse idiopathic skeletal hyperostosis 06/05/2013   3:30 PM, 02/17/21 Etta Grandchild, PT, DPT Physical Therapist - [facility]  Outpatient Physical Therapy- [place] [facility] 02/17/2021, 3:24 PM  [place] MAIN [facility] SERVICES [address] [place], [place], [address] Phone: [phone]   Fax:  [phone]  Name: NYLE OLESH MRN: [id number] Date of Birth: June 19, 1961

## 2021-02-24 ENCOUNTER — Ambulatory Visit: Payer: Medicaid Other

## 2021-02-24 ENCOUNTER — Ambulatory Visit: Payer: Medicaid Other | Admitting: Physical Therapy

## 2021-02-24 ENCOUNTER — Telehealth: Payer: Self-pay | Admitting: Family Medicine

## 2021-02-24 ENCOUNTER — Encounter: Payer: Self-pay | Admitting: Physical Therapy

## 2021-02-24 ENCOUNTER — Other Ambulatory Visit: Payer: Self-pay

## 2021-02-24 DIAGNOSIS — M25511 Pain in right shoulder: Secondary | ICD-10-CM | POA: Diagnosis not present

## 2021-02-24 DIAGNOSIS — R2689 Other abnormalities of gait and mobility: Secondary | ICD-10-CM

## 2021-02-24 DIAGNOSIS — M6281 Muscle weakness (generalized): Secondary | ICD-10-CM

## 2021-02-24 DIAGNOSIS — R278 Other lack of coordination: Secondary | ICD-10-CM

## 2021-02-24 DIAGNOSIS — G8929 Other chronic pain: Secondary | ICD-10-CM

## 2021-02-24 DIAGNOSIS — R2681 Unsteadiness on feet: Secondary | ICD-10-CM | POA: Diagnosis not present

## 2021-02-24 NOTE — Telephone Encounter (Signed)
Pt has been mailed a Paramedic.

## 2021-02-24 NOTE — Therapy (Signed)
[place] MAIN [facility] SERVICES [address] [place], [place], [address] Phone: [phone]   Fax:  [phone]  Physical Therapy Treatment/Discharge from Therapy  Patient Details  Name: COVEY BALLER MRN: [id number] Date of Birth: 02-Jan-1961 Referring Provider (PT): Charlott Rakes, MD   Encounter Date: 02/24/2021   PT End of Session - 02/24/21 1615     Visit Number 6    Number of Visits 25    Date for PT Re-Evaluation 04/22/21    Authorization Type [place] Medicaid (27 VL PT/ST/OT)    Authorization Time Period cert 8/87/57-97/2/82; (Medicaid Josem Kaufmann 7/19-8/8 for 3 visits; auth #2: 5 visits 8/10-11/1)    Authorization - Visit Number 1    Authorization - Number of Visits 5    PT Start Time 0601    PT Stop Time 1230    PT Time Calculation (min) 45 min    Equipment Utilized During Treatment Gait belt;Other (comment)    Activity Tolerance Patient tolerated treatment well;No increased pain    Behavior During Therapy [facility] for tasks assessed/performed             Past Medical History:  Diagnosis Date   Anxiety    Brain tumor ([place]) 02/20/2013   brain tumor removed in March 2014, Dr Donald Pore   Diffuse idiopathic skeletal hyperostosis 06/05/2013   Dizziness    Enlarged prostate    GERD (gastroesophageal reflux disease)    Headache(784.0)    scattered   High cholesterol    Hypertension    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[place]) 03/07/2019   Meningioma ([place])    Meningioma, recurrent of brain ([place]) 01/31/2019   Neuropathy 12/05/2019   Pre-diabetes    Rotator cuff tear    right   Seizures ([place])    11/27/19 last sz 1 wk ago    Past Surgical History:  Procedure Laterality Date   APPLICATION OF CRANIAL NAVIGATION N/A 01/10/2019   Procedure: APPLICATION OF CRANIAL NAVIGATION;  Surgeon: Erline Levine, MD;  Location: [place];  Service: Neurosurgery;  Laterality: N/A;   CATARACT EXTRACTION Right    CRANIOTOMY Right 11/06/2012    Procedure: CRANIOTOMY TUMOR EXCISION;  Surgeon: Erline Levine, MD;  Location: [place] NEURO ORS;  Service: Neurosurgery;  Laterality: Right;  Right Parasagittal craniotomy for meningioma with Stealth   CRANIOTOMY Right 01/10/2019   Procedure: Right Parasagittal Craniotomy for Tumor;  Surgeon: Erline Levine, MD;  Location: [place];  Service: Neurosurgery;  Laterality: Right;  Right parasagittal craniotomy for tumor   ESOPHAGOGASTRODUODENOSCOPY     JOINT REPLACEMENT Right 2018   right hip   REVERSE SHOULDER ARTHROPLASTY Right 01/08/2021   Procedure: RIGHT REVERSE SHOULDER ARTHROPLASTY;  Surgeon: Meredith Pel, MD;  Location: [place];  Service: Orthopedics;  Laterality: Right;   right knee arthroscopy     SHOULDER ARTHROSCOPY Right 06/09/2020   Procedure: RIGHT SHOULDER ARTHROSCOPY AND DEBRIDEMENT;  Surgeon: Newt Minion, MD;  Location: [place];  Service: Orthopedics;  Laterality: Right;    There were no vitals filed for this visit.   Subjective Assessment - 02/24/21 1157     Subjective Pt reports no changes since last session. Reports he has tried variations we practiced in therapy last time with getting in and out of the shower and he reports his shoulder feels better and it is also easier. Pt was educated regarding his visit count between the three therapies. As discussed with his OT he is agreeable to discharge from PT today  in order to alow him to have more available therapy visits for OT due to his recent shouler RTSA.    Pertinent History Pt presents to PT using SPC following OT evaluation for R TSA. Pt is presenting to PT due to difficulty with gait, balance and LE strength. Pt reports he as attended neuro rehab before following second brain surgery (2021). He reports he did PT for about a month. Pt saw functional improvements but reports his legs were "so weak they just lost the strength in them." Pt would like to strengthen his legs so he won't "be dragging them." Pt states,  "my right is pretty much my strong leg." Due to pt's recent R rTSA he is unable to rely on UEs as much to maintain precautions. Pt says he has LLE brace but doesn't wear it because it was not comfortable. He reports no pain in legs currently. Pt does report increased L foot "sitffness." Pt reports no falls, but says he has had multiple near-falls.PMH includeds R TSA (01/08/2021) secondary to R Rotator Cuff Arthropathy and arthritis, meningioma with resection x's 2, where first resection occurred 7 years ago, and 2nd resection 1 year ago.Other PMH includes GERD, Anxiety, L foot drop, HTN, Neuropathy, and seizures.    Limitations Lifting;Standing;Walking;House hold activities    How long can you stand comfortably? reports limited primarily d/t L LE stiffness, difficulty picking up L foot    Currently in Pain? Yes    Pain Score 6     Pain Location --   shoulder   Pain Orientation Right    Pain Descriptors / Indicators Tightness                [facility] PT Assessment - 02/24/21 0001       Observation/Other Assessments   Focus on Therapeutic Outcomes (FOTO)  59 (P)    46 at evlauation     Ambulation/Gait   Ambulation Distance (Feet) 600 Feet (P)     Assistive device Straight cane (P)     Gait Pattern -- (P)    2-point pattern with SCP in RUE   Gait velocity 0.7ms (P)     Pre-Gait Activities 6 minutes even (P)              Treatment:   Ambulation with SPC in Right UE x 6 minutes and around 600 feet Ambulation with AFO and SPS, pt demonstrated improved foot clearance but demonstrated  occasional L toed drag in the early swing phase. This was improved in the next bout of  ambulation when tape was applied to bottom of anterior aspect of his left shoe in order to decrease  friction at early swing phase. Pt educated in ways to replicate this in his home setting and verbalized understanding.   STS x 10 with arms in shoulder flexion in order to move his center of mass anteriorly and  improve his biomechanical  ability to perform exercise  The following activities were completed in parallel bars or at balance bar    - sidestepping x 12 each direction, increased step length noted when stepping to the left versus his right.  Cues to utilize UE for balance.  - Marching x 20, with cues to utilize UE for support pt was able to march through much greater range of motion.    Calf stretch at steps with B HH support, 2 x 30 seconds, cue to only stretch one LE at a time in order to ensure  L LE was  getting adequate stretch  Bridges 2 x 10, cues to ensure proper foot placement Supine marching 10 x each LE                       PT Education - 02/24/21 1600     Education Details Pt provided with thorough HEP from [place]. He was walked through all of these exercises. and was able to complete them with good understanfding.Access code for medbridge : [email or web address]    Person(s) Educated Patient    Methods Explanation;Handout    Comprehension Verbalized understanding;Returned demonstration              PT Short Term Goals - 02/24/21 1628       PT SHORT TERM GOAL #1   Title Pt will be independent with HEP in order to improve strength and balance in order to decrease fall risk and improve function at home.    Baseline 7/14: to be initiated    Time 6    Period Weeks    Status Achieved    Target Date 03/11/21      PT SHORT TERM GOAL #2   Title Pt to increase PROM/AROM of L ankle DF by at least 10 degrees to promote functional mobility    Baseline 7/14: L ankle DF lacking 5 deg from neutral    Time 6    Period Weeks    Status Unable to assess    Target Date 03/11/21               PT Long Term Goals - 02/24/21 1629       PT LONG TERM GOAL #1   Title Pt will decrease TUG to below 14 seconds/decrease in order to demonstrate decreased fall risk.    Baseline 7/14: 18.83 sec    Time 12    Period Weeks    Status Unable to assess      PT LONG  TERM GOAL #2   Title Pt will increase 6MWT by at least 47m(1647f in order to demonstrate clinically significant improvement in endurance and community ambulation .    Baseline 7/14: 610 ft with SPC    Time 12    Period Weeks    Status Partially Met   able to ambulate over 600 feet in 6 minutes but did not increase to desired amount and formal 6MWT was not administerd on this date     PT LONG TERM GOAL #3   Title Patient will increase 10 meter walk test to >1.83m55mas to improve gait speed for better community ambulation and to reduce fall risk.    Baseline 7/14: 0.625 m/s wtih SPC in LUE    Time 12    Period Weeks    Status Unable to assess      PT LONG TERM GOAL #4   Title Pt will improve DGI by at least 3 points in order to demonstrate clinically significant improvement in balance and decreased risk for falls.    Baseline 7/14: deferred to next session    Time 12    Period Weeks    Status Unable to assess      PT LONG TERM GOAL #5   Title Patient will increase FOTO score by at least 10 points to demonstrate an improvement in mobility and quality of life.    Baseline 7/14: deferred dt incorrect questionnaire, pt will take future session with corrected version    Time 12    Period Weeks  Status Achieved   FOTO score increase by 13 points                  Plan - 02/24/21 1616     Clinical Impression Statement Pt demonstrated improved tolerance for ambulation today. Pt ambulated about 600 feet in 6 minutes without requiring rest breaks and without his gait pattern altering significantly throughout. Pt did demonsterate some dragging of his left foot in addition to limitations in foot clearance when ambulating, particulaly when attempting to dual tasks. When triald with L AFO pt demonstrated improved foot clearance but did also demonstrated toe dragging in early swing phase on the L LE. Pt was educated regarding extensive HEP with focus on improveing his LE strength and  flexibility (L ankle). Following treatmetn session all of pt questions were answered and updated HEP handout provided. Pt encouraged to continue with OT for his RTSA in order to maximize the function and use of his UE. Pt will be discharged from skilled physical therapy at this time due to his RTSA lengthly therapy requirements post operatively. All goals were not assessed due to time limitations with ensuring efficacy with HEP.    Personal Factors and Comorbidities Comorbidity 1;Comorbidity 2;Comorbidity 3+;Social Background;Past/Current Experience;Time since onset of injury/illness/exacerbation;Fitness    Comorbidities PMH includeds R TSA (01/08/2021) secondary to R Rotator Cuff Arthropathy and arthritis, meningioma with resection x's 2, where first resection occurred 7 years ago, and 2nd resection 1 year ago.Other PMH includes GERD, Anxiety, L foot drop, HTN, Neuropathy, and seizures.    Examination-Activity Limitations Bathing;Bed Mobility;Bend;Carry;Dressing;Lift;Locomotion Level;Reach Overhead;Squat;Stairs;Stand;Transfers;Hygiene/Grooming    Examination-Participation Restrictions Community Activity;Meal Prep;Shop;Yard Work;Cleaning;Laundry    Stability/Clinical Decision Making Evolving/Moderate complexity    Clinical Decision Making Moderate    Rehab Potential Good    PT Frequency Other (comment)   D/C at this time due to insurance visit limit and commorbidity of recent RTSA   PT Duration Other (comment)   D/C at this time due to insurance visit limit and commorbidity of recent RTSA   PT Treatment/Interventions Biofeedback;ADLs/Self Care Home Management;Cryotherapy;Electrical Stimulation;Moist Heat;DME Instruction;Gait training;Stair training;Functional mobility training;Therapeutic activities;Therapeutic exercise;Balance training;Neuromuscular re-education;Patient/family education;Orthotic Fit/Training;Manual techniques;Passive range of motion;Scar mobilization;Energy  conservation;Splinting;Taping;Joint Manipulations    PT Next Visit Plan Pt to bwe discharged from skilled physical therapy at this time due to insurance visit count limitations and recent RTSA    PT Home Exercise Plan Access Code: [email or web address], exercises reviewed extensively with patient and all questions regarding HEP were answered    Consulted and Agree with Plan of Care Patient             Patient will benefit from skilled therapeutic intervention in order to improve the following deficits and impairments:  Abnormal gait, Decreased activity tolerance, Decreased endurance, Decreased range of motion, Decreased knowledge of use of DME, Decreased strength, Hypomobility, Impaired sensation, Impaired UE functional use, Improper body mechanics, Pain, Decreased balance, Decreased coordination, Decreased mobility, Hypermobility, Impaired flexibility, Postural dysfunction  Visit Diagnosis: Muscle weakness (generalized)  Other lack of coordination  Other abnormalities of gait and mobility     Problem List Patient Active Problem List   Diagnosis Date Noted   Arthritis of right shoulder region    S/P reverse total shoulder arthroplasty, 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 [place], [place], [address] Phone: [phone]   Fax:  [phone]  Name: JEROMIAH OHALLORAN MRN: [id number] Date of Birth: 09-May-1961

## 2021-02-24 NOTE — Telephone Encounter (Signed)
Copied from [place] [phone]. Topic: General - Other >> Feb 17, 2021 10:25 AM Bayard Beaver wrote: Reason for [id number] called about getting referred to a different dentist by Dr Margarita Rana. The one he had before didn't work out. Please call back

## 2021-02-24 NOTE — Therapy (Signed)
[place] MAIN [facility] SERVICES [address] [place], [place], [address] Phone: [phone]   Fax:  [phone]  Occupational Therapy Treatment  Patient Details  Name: Joshua [name] MRN: [id number] Date of Birth: 1961/06/22 No data recorded  Encounter Date: 02/24/2021   OT End of Session - 02/24/21 1203     Visit Number 5    Number of Visits 12    Date for OT Re-Evaluation 04/20/21    Authorization Time Period Progress report period starting  01/28/2021    OT Start Time 1103    OT Stop Time 1146    OT Time Calculation (min) 43 min    Equipment Utilized During Treatment single point cane    Activity Tolerance Patient tolerated treatment well    Behavior During Therapy [facility] for tasks assessed/performed             Past Medical History:  Diagnosis Date   Anxiety    Brain tumor ([place]) 02/20/2013   brain tumor removed in March 2014, Dr Donald Pore   Diffuse idiopathic skeletal hyperostosis 06/05/2013   Dizziness    Enlarged prostate    GERD (gastroesophageal reflux disease)    Headache(784.0)    scattered   High cholesterol    Hypertension    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[place]) 03/07/2019   Meningioma ([place])    Meningioma, recurrent of brain ([place]) 01/31/2019   Neuropathy 12/05/2019   Pre-diabetes    Rotator cuff tear    right   Seizures ([place])    11/27/19 last sz 1 wk ago    Past Surgical History:  Procedure Laterality Date   APPLICATION OF CRANIAL NAVIGATION N/A 01/10/2019   Procedure: APPLICATION OF CRANIAL NAVIGATION;  Surgeon: Erline Levine, MD;  Location: [place];  Service: Neurosurgery;  Laterality: N/A;   CATARACT EXTRACTION Right    CRANIOTOMY Right 11/06/2012   Procedure: CRANIOTOMY TUMOR EXCISION;  Surgeon: Erline Levine, MD;  Location: [place] NEURO ORS;  Service: Neurosurgery;  Laterality: Right;  Right Parasagittal craniotomy for meningioma with Stealth   CRANIOTOMY Right 01/10/2019   Procedure: Right  Parasagittal Craniotomy for Tumor;  Surgeon: Erline Levine, MD;  Location: [place];  Service: Neurosurgery;  Laterality: Right;  Right parasagittal craniotomy for tumor   ESOPHAGOGASTRODUODENOSCOPY     JOINT REPLACEMENT Right 2018   right hip   REVERSE SHOULDER ARTHROPLASTY Right 01/08/2021   Procedure: RIGHT REVERSE SHOULDER ARTHROPLASTY;  Surgeon: Meredith Pel, MD;  Location: [place];  Service: Orthopedics;  Laterality: Right;   right knee arthroscopy     SHOULDER ARTHROSCOPY Right 06/09/2020   Procedure: RIGHT SHOULDER ARTHROSCOPY AND DEBRIDEMENT;  Surgeon: Newt Minion, MD;  Location: [place];  Service: Orthopedics;  Laterality: Right;    There were no vitals filed for this visit.   Subjective Assessment - 02/24/21 1201     Subjective  "I've been doing my cane exercises about every other day."    Pertinent History Pt. is a 60 y.o. male who had a Right TSA secondary to Right Rotator Cuff Arthropathy, and arthritis. Pt.'s PMHx includes: Meningioma with resection x's 2. The first resection occurred 7 years ago, and the 2nd one occurred 1 year ago. Pt. has a history of GERD, Anxiety, Left foot drop, HTN, Neuropathy, and seizures. Pt. resides in a group home, and has assist as needed from neighbors. Pt. enjoys fishing.    Limitations Right shoulder restrictions    Patient Stated Goals To improve his  Right UE, and balance.    Currently in Pain? Yes    Pain Score 6     Pain Location Shoulder    Pain Orientation Right    Pain Descriptors / Indicators Aching;Tightness    Pain Type Chronic pain    Pain Radiating Towards shoulder to bicep    Pain Onset More than a month ago    Pain Frequency Constant    Aggravating Factors  activity or prolonged rest which makes shoulder stiff    Pain Relieving Factors meds, rest, ice    Effect of Pain on Daily Activities limited tolerance for reaching activities using RUE    Multiple Pain Sites No            Occupational Therapy  Treatment: Therapeutic Exercise: Performed scapular AROM with bilat shoulder shrugs and retraction x2 sets 10 reps each.  Passive stretching for R shoulder flex/abd within comfortable ROM x3 sets 10 reps, AAROM for R shoulder ER 0-30, vc for neutral head position.  Reviewed isometric strength for R shoulder with min vc for accuracy.  Used cane for AAROM for bilat shoulder flex/abd x3 sets 10 reps each, and active R shoulder flex and abd x2 sets 7 reps each.  Muscle massage to bicep, upper trap/anterior/middle/posterior deltoid for pain reduction/muscle relaxation.    Response to Treatment: Improved pain from 6-7/10 at beginning of session to 4/10 by end of session.  Pt reported, "I feel loosened up."  Pt showing good carryover with HEP.  OT encouraged pt increase cane stretch frequency from every other day to daily at home.  Pt is steadily increasing PROM/AROM to R shoulder, carrying over to improved use of RUE during self care.  Passive R shoulder flex 0-120, active 105.  Passive R shoulder abd 0-115, active 105.  All steri strips are gone and incision is newly closed with no s/s of infection noted.  Still encouraged pt to avoid any lotions or creams to incision as it is newly epithelialized and wait for follow up appt with ortho.  Pt verbalized understanding/agreed.  OT called and left message with orthopedist's office to confirm pt's 4 week follow up as pt is unsure of when he returns.  Waiting for return call.     OT Education - 02/24/21 1203     Education Details HEP, including cane stretches in supine and isometric strengthening    Person(s) Educated Patient    Methods Explanation;Demonstration    Comprehension Verbalized understanding;Returned demonstration              OT Short Term Goals - 01/28/21 1654       OT SHORT TERM GOAL #1   Title Pt. will demonstrate Independence with with HEPs    Baseline Eval: Assess ongoing need for HEPs.    Time 12    Period Weeks    Status New     Target Date 04/20/21               OT Long Term Goals - 01/28/21 1656       OT LONG TERM GOAL #1   Title Pt. will improve RUE shoulder flexion within RTSA protocol parameters to be able to independently reach out to assist with donning pants over feet    Baseline Eval: Pt has difficulty secondary to limited right shoulder flexion.    Time 12    Period Weeks    Status New    Target Date 04/20/21      OT LONG TERM GOAL #  2   Title Pt. will mprove right shoulder abduction within RTSA protocol parameters in preparation for independently reaching up to complete hair care.    Baseline Eval: Pt. has difficulty secondary to limited right shoulder Abduction.    Time 12    Period Weeks    Status New    Target Date 04/20/21      OT LONG TERM GOAL #3   Title Pt. will independently be able to use his RUE to apply deoderant thoroughly to the left underarm.    Baseline Eval: Pt. has difficulty secondary to limited RUE    Time 12    Period Weeks    Status New    Target Date 04/20/21      OT LONG TERM GOAL #4   Title Pt. will improve FOTO score by 2 points for clinically relavant functional change for the specified ADL/IADLs    Baseline Eval: FOTO score: 45    Time 12    Period Weeks    Status New    Target Date 04/20/21               Plan - 02/24/21 1216     Clinical Impression Statement Improved pain from 6-7/10 at beginning of session to 4/10 by end of session.  Pt reported, "I feel loosened up."  Pt showing good carryover with HEP.  OT encouraged pt increase cane stretch frequency from every other day to daily at home.  Pt is steadily increasing PROM/AROM to R shoulder, carrying over to improved use of RUE during self care.  Passive R shoulder flex 0-120, active 105.  Passive R shoulder abd 0-115, active 105.  All steri strips are gone and incision is newly closed with no s/s of infection noted.  Still encouraged pt to avoid any lotions or creams to incision as it is newly  epithelialized and wait for follow up appt with ortho.  Pt verbalized understanding/agreed.  OT called and left message with orthopedist's office to confirm pt's 4 week follow up as pt is unsure of when he returns.  Waiting for return call.    Occupational performance deficits (Please refer to evaluation for details): ADL's;IADL's    Rehab Potential Fair    Clinical Decision Making Several treatment options, min-mod task modification necessary    Comorbidities Affecting Occupational Performance: May have comorbidities impacting occupational performance    Modification or Assistance to Complete Evaluation  Min-Moderate modification of tasks or assist with assess necessary to complete eval    OT Frequency 2x / week    OT Duration 12 weeks    OT Treatment/Interventions Self-care/ADL training;Therapeutic exercise;Neuromuscular education;Passive range of motion;Therapeutic activities;Patient/family education;DME and/or AE instruction    Consulted and Agree with Plan of Care Patient             Patient will benefit from skilled therapeutic intervention in order to improve the following deficits and impairments:           Visit Diagnosis: Muscle weakness (generalized)  Other lack of coordination  Chronic right shoulder pain    Problem List Patient Active Problem List   Diagnosis Date Noted   Arthritis of right shoulder region    S/P reverse total shoulder arthroplasty, 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   Essential hypertension 07/02/2020   Prediabetes 07/02/2020   Polyuria 07/02/2020   Nontraumatic complete tear of right rotator cuff    Impingement syndrome of right shoulder    Neuropathy 12/05/2019   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[place]) 03/07/2019  Seizures ([place]) 01/31/2019   Meningioma, recurrent of brain ([place]) 01/31/2019   Meningioma ([place]) 01/03/2019   Diffuse idiopathic skeletal hyperostosis 06/05/2013   Leta Speller, MS, OTR/L  Darleene Cleaver 02/24/2021, 12:16  PM  [place] MAIN [facility] SERVICES [address] [place], [place], [address] Phone: [phone]   Fax:  [phone]  Name: Joshua [name] MRN: [id number] Date of Birth: 11/03/1960

## 2021-02-25 ENCOUNTER — Other Ambulatory Visit: Payer: Self-pay

## 2021-02-25 ENCOUNTER — Emergency Department
Admission: EM | Admit: 2021-02-25 | Discharge: 2021-02-25 | Disposition: A | Discharge status: Home / Self Care | Payer: Medicaid Other | Attending: Emergency Medicine | Admitting: Emergency Medicine

## 2021-02-25 ENCOUNTER — Encounter: Payer: Self-pay | Admitting: Emergency Medicine

## 2021-02-25 DIAGNOSIS — S70362A Insect bite (nonvenomous), left thigh, initial encounter: Secondary | ICD-10-CM | POA: Insufficient documentation

## 2021-02-25 DIAGNOSIS — W57XXXA Bitten or stung by nonvenomous insect and other nonvenomous arthropods, initial encounter: Secondary | ICD-10-CM | POA: Insufficient documentation

## 2021-02-25 DIAGNOSIS — Z5321 Procedure and treatment not carried out due to patient leaving prior to being seen by health care provider: Secondary | ICD-10-CM | POA: Insufficient documentation

## 2021-02-25 NOTE — ED Notes (Signed)
Pt called 3rd time with no answer.

## 2021-02-25 NOTE — ED Notes (Signed)
Pt called with no answer

## 2021-02-25 NOTE — ED Triage Notes (Signed)
Pt comes into the ED via POV c/o insect bites to the left thigh.  Pt in NAD at this time.  No redness around the bites at this time.

## 2021-02-26 ENCOUNTER — Ambulatory Visit: Payer: Self-pay | Admitting: *Deleted

## 2021-02-26 NOTE — Telephone Encounter (Signed)
Pt is calling and has been having dry mouth for over a month and his BS has been ranging from 98- 115 in morning. Pt is concern he maybe diabetic and has an appt with dr Margarita Rana in oct and would like to some advice    Reason for Disposition  Dry mouth is a chronic symptom (recurrent or ongoing AND present > 4 weeks)  Answer Assessment - Initial Assessment Questions 1. SYMPTOM: "What's the main symptom you're concerned about?" (e.g., chapped lips, dry mouth, lump, sores)     Dry mouth- patient wake thirsty 2. ONSET: "When did the  thrist  start?"     3-4 weeks 3. PAIN: "Is there any pain?" If Yes, ask: "How bad is it?" (Scale: 1-10; mild, moderate, severe)   - MILD (1-3):  doesn't interfere with eating or normal activities   - MODERATE (4-7): interferes with eating some solids and normal activities   - SEVERE (8-10):  excruciating pain, interferes with most normal activities   - SEVERE DYSPHAGIA: can't swallow liquids, drooling     Dry in throat at night 4. CAUSE: "What do you think is causing the symptoms?"     Not sure-patient thinks it maybe related to diabetes 5. OTHER SYMPTOMS: "Do you have any other symptoms?" (e.g., fever, sore throat, toothache, swelling)     no 6. PREGNANCY: "Is there any chance you are pregnant?" "When was your last menstrual period?"     N/a  Protocols used: Mouth Symptoms-A-AH

## 2021-02-26 NOTE — Telephone Encounter (Signed)
Patient is calling to report he is waking up 2-3 times at night with dry mouth- thirst. Patient states this has been going on for 3-4 weeks now. Patient does check his glucose levels and they have been averaging 90's fasting.Patient states he is not having this symptom during the day and reports no other symptoms that are bothering him. He has been using Biotene lozenges for relief and they help. Appointment has been scheduled within protocol.

## 2021-03-03 ENCOUNTER — Ambulatory Visit: Payer: Medicaid Other | Admitting: Physical Therapy

## 2021-03-03 ENCOUNTER — Other Ambulatory Visit: Payer: Self-pay

## 2021-03-03 ENCOUNTER — Ambulatory Visit: Payer: Medicaid Other

## 2021-03-03 DIAGNOSIS — M25511 Pain in right shoulder: Secondary | ICD-10-CM

## 2021-03-03 DIAGNOSIS — R2689 Other abnormalities of gait and mobility: Secondary | ICD-10-CM | POA: Diagnosis not present

## 2021-03-03 DIAGNOSIS — M6281 Muscle weakness (generalized): Secondary | ICD-10-CM | POA: Diagnosis not present

## 2021-03-03 DIAGNOSIS — R278 Other lack of coordination: Secondary | ICD-10-CM

## 2021-03-03 DIAGNOSIS — G8929 Other chronic pain: Secondary | ICD-10-CM | POA: Diagnosis not present

## 2021-03-03 DIAGNOSIS — R2681 Unsteadiness on feet: Secondary | ICD-10-CM | POA: Diagnosis not present

## 2021-03-03 NOTE — Therapy (Signed)
[place] MAIN [facility] SERVICES [address] [place], [place], [address] Phone: [phone]   Fax:  [phone]  Occupational Therapy Treatment  Patient Details  Name: Joshua [name] MRN: [id number] Date of Birth: Sep 13, 1960 No data recorded  Encounter Date: 03/03/2021   OT End of Session - 03/03/21 1321     Visit Number 6    Number of Visits 12    Date for OT Re-Evaluation 04/20/21    Authorization Time Period Progress report period starting  01/28/2021    OT Start Time 1055    OT Stop Time 1143    OT Time Calculation (min) 48 min    Equipment Utilized During Treatment single point cane    Activity Tolerance Patient tolerated treatment well    Behavior During Therapy [facility] for tasks assessed/performed             Past Medical History:  Diagnosis Date   Anxiety    Brain tumor ([place]) 02/20/2013   brain tumor removed in March 2014, Dr Donald Pore   Diffuse idiopathic skeletal hyperostosis 06/05/2013   Dizziness    Enlarged prostate    GERD (gastroesophageal reflux disease)    Headache(784.0)    scattered   High cholesterol    Hypertension    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   Meningioma ([place])    Meningioma, recurrent of brain ([place]) 01/31/2019   Neuropathy 12/05/2019   Pre-diabetes    Rotator cuff tear    right   Seizures ([place])    11/27/19 last sz 1 wk ago    Past Surgical History:  Procedure Laterality Date   APPLICATION OF CRANIAL NAVIGATION N/A 01/10/2019   Procedure: APPLICATION OF CRANIAL NAVIGATION;  Surgeon: Erline Levine, MD;  Location: [place];  Service: Neurosurgery;  Laterality: N/A;   CATARACT EXTRACTION Right    CRANIOTOMY Right 11/06/2012   Procedure: CRANIOTOMY TUMOR EXCISION;  Surgeon: Erline Levine, MD;  Location: [place] [facility];  Service: Neurosurgery;  Laterality: Right;  Right Parasagittal craniotomy for meningioma with Stealth   CRANIOTOMY Right 01/10/2019   Procedure: Right  Parasagittal Craniotomy for Tumor;  Surgeon: Erline Levine, MD;  Location: [place];  Service: Neurosurgery;  Laterality: Right;  Right parasagittal craniotomy for tumor   ESOPHAGOGASTRODUODENOSCOPY     JOINT REPLACEMENT Right 2018   right hip   REVERSE SHOULDER ARTHROPLASTY Right 01/08/2021   Procedure: RIGHT REVERSE SHOULDER ARTHROPLASTY;  Surgeon: Meredith Pel, MD;  Location: [place];  Service: Orthopedics;  Laterality: Right;   right knee arthroscopy     SHOULDER ARTHROSCOPY Right 06/09/2020   Procedure: RIGHT SHOULDER ARTHROSCOPY AND DEBRIDEMENT;  Surgeon: Newt Minion, MD;  Location: [place];  Service: Orthopedics;  Laterality: Right;    There were no vitals filed for this visit.   Subjective Assessment - 03/03/21 1319     Subjective  HEP review    Pertinent History Pt. is a 60 y.o. male who had a Right TSA secondary to Right Rotator Cuff Arthropathy, and arthritis. Pt.'s PMHx includes: Meningioma with resection x's 2. The first resection occurred 7 years ago, and the 2nd one occurred 1 year ago. Pt. has a history of GERD, Anxiety, Left foot drop, HTN, Neuropathy, and seizures. Pt. resides in a group home, and has assist as needed from neighbors. Pt. enjoys fishing.    Limitations Right shoulder restrictions    Patient Stated Goals To improve his Right UE, and balance.    Currently  in Pain? Yes    Pain Score 4     Pain Location Shoulder    Pain Orientation Right    Pain Descriptors / Indicators Aching;Tightness    Pain Type Chronic pain    Pain Onset More than a month ago    Pain Frequency Intermittent    Aggravating Factors  overhead reaching    Pain Relieving Factors rest, meds    Effect of Pain on Daily Activities limited tolerance for reaching activities using RUE; rest breaks needed    Multiple Pain Sites No           Occupational Therapy Treatment: Therapeutic Exercise: Participation in passive stretching in supine for R shoulder flex/abd to  tolerance, R shoulder ER with elbow at side 0-30.  Performed isometric strengthening for R shoulder flex/ext/abd/add x3 sets 10 reps each with good tolerance.  Used therapy resistant clothespins to clip on vertical and horizontal bar to facilitate multi planal reaching with RUE.   Response to Treatment: Pt making steady gains with RUE ROM and continues to engage RUE more into functional reaching and light object lifting (~5 lbs).  Pt reports he can now reach overhead with BUEs to pull his trunk down on his SUV.  R shoulder passive flexion 0-120, active 0-110, passive shoulder abd 0-115, active 0-110, passive/active ER 0-30.  Both active measurements for flex/abd have improved since last week.  Pt reports 0/10 pain in R shoulder at rest, 4/10 pain with activity.  Overall good tolerance to therapeutic exercise this day with good reported ADL tolerance.  Pt will continue to benefit from skilled outpatient OT for progression of R shoulder ROM and strength per MD protocol, working to maximize functional use of RUE for ADLs/IADLs.   Pt reports he has ortho follow up 03/08/2021.      OT Short Term Goals - 01/28/21 1654       OT SHORT TERM GOAL #1   Title Pt. will demonstrate Independence with with HEPs    Baseline Eval: Assess ongoing need for HEPs.    Time 12    Period Weeks    Status New    Target Date 04/20/21               OT Long Term Goals - 01/28/21 1656       OT LONG TERM GOAL #1   Title Pt. will improve RUE shoulder flexion within RTSA protocol parameters to be able to independently reach out to assist with donning pants over feet    Baseline Eval: Pt has difficulty secondary to limited right shoulder flexion.    Time 12    Period Weeks    Status New    Target Date 04/20/21      OT LONG TERM GOAL #2   Title Pt. will mprove right shoulder abduction within RTSA protocol parameters in preparation for independently reaching up to complete hair care.    Baseline Eval: Pt. has  difficulty secondary to limited right shoulder Abduction.    Time 12    Period Weeks    Status New    Target Date 04/20/21      OT LONG TERM GOAL #3   Title Pt. will independently be able to use his RUE to apply deoderant thoroughly to the left underarm.    Baseline Eval: Pt. has difficulty secondary to limited RUE    Time 12    Period Weeks    Status New    Target Date 04/20/21  OT LONG TERM GOAL #4   Title Pt. will improve FOTO score by 2 points for clinically relavant functional change for the specified ADL/IADLs    Baseline Eval: FOTO score: 45    Time 12    Period Weeks    Status New    Target Date 04/20/21                   Plan - 03/03/21 1332     Clinical Impression Statement Pt making steady gains with RUE ROM and continues to engage RUE more into functional reaching and light object lifting (~5 lbs).  Pt reports he can now reach overhead with BUEs to pull his trunk down on his SUV.  R shoulder passive flexion 0-120, active 0-110, passive shoulder abd 0-115, active 0-110, passive/active ER 0-30.  Both active measurements for flex/abd have improved since last week.  Pt reports 0/10 pain in R shoulder at rest, 4/10 pain with activity.  Overall good tolerance to therapeutic exercise this day with good reported ADL tolerance.  Pt will continue to benefit from skilled outpatient OT for progression of R shoulder ROM and strength per MD protocol, working to maximize functional use of RUE for ADLs/IADLs.    Occupational performance deficits (Please refer to evaluation for details): ADL's;IADL's    Rehab Potential Fair    Clinical Decision Making Several treatment options, min-mod task modification necessary    Comorbidities Affecting Occupational Performance: May have comorbidities impacting occupational performance    Modification or Assistance to Complete Evaluation  Min-Moderate modification of tasks or assist with assess necessary to complete eval    OT Frequency 2x  / week    OT Duration 12 weeks    OT Treatment/Interventions Self-care/ADL training;Therapeutic exercise;Neuromuscular education;Passive range of motion;Therapeutic activities;Patient/family education;DME and/or AE instruction    Consulted and Agree with Plan of Care Patient             Patient will benefit from skilled therapeutic intervention in order to improve the following deficits and impairments:           Visit Diagnosis: Muscle weakness (generalized)  Other lack of coordination  Chronic right shoulder pain    Problem List Patient Active Problem List   Diagnosis Date Noted   Arthritis of right shoulder region    S/P reverse total shoulder arthroplasty, 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   Essential hypertension 07/02/2020   Prediabetes 07/02/2020   Polyuria 07/02/2020   Nontraumatic complete tear of right rotator cuff    Impingement syndrome of right shoulder    Neuropathy 12/05/2019   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[place]) 03/07/2019   Seizures ([place]) 01/31/2019   Meningioma, recurrent of brain ([place]) 01/31/2019   Meningioma ([place]) 01/03/2019   Diffuse idiopathic skeletal hyperostosis 06/05/2013   Leta Speller, MS, OTR/L  Darleene Cleaver 03/03/2021, 1:32 PM  [place] MAIN [facility] SERVICES [address] [place], [place], [address] Phone: [phone]   Fax:  [phone]  Name: Joshua [name] MRN: [id number] Date of Birth: Jan 06, 1961

## 2021-03-04 ENCOUNTER — Ambulatory Visit (INDEPENDENT_AMBULATORY_CARE_PROVIDER_SITE_OTHER): Payer: Medicaid Other | Admitting: Podiatry

## 2021-03-04 ENCOUNTER — Encounter: Payer: Self-pay | Admitting: Podiatry

## 2021-03-04 DIAGNOSIS — M79674 Pain in right toe(s): Secondary | ICD-10-CM | POA: Diagnosis not present

## 2021-03-04 DIAGNOSIS — G629 Polyneuropathy, unspecified: Secondary | ICD-10-CM | POA: Diagnosis not present

## 2021-03-04 DIAGNOSIS — R7303 Prediabetes: Secondary | ICD-10-CM

## 2021-03-04 DIAGNOSIS — B351 Tinea unguium: Secondary | ICD-10-CM | POA: Diagnosis not present

## 2021-03-04 DIAGNOSIS — M79675 Pain in left toe(s): Secondary | ICD-10-CM | POA: Diagnosis not present

## 2021-03-04 NOTE — Progress Notes (Signed)
This patient presents to the office with chief complaint of long thick nails and diabetic feet.  Patient is diagnosed with prediabetes and is taking metformin. This patient  says there  is  no pain and discomfort in his  feet.  This patient says there are long thick painful nails.  These nails are painful walking and wearing shoes.  Patient has no history of infection or drainage from both feet.  Patient is unable to  self treat his own nails . This patient presents  to the office today for treatment of the  long nails and a foot evaluation due to history of  diabetes.  General Appearance  Alert, conversant and in no acute stress.  Vascular  Dorsalis pedis  pulses are palpable  bilaterally. Posterior tibial pulses are weakly palpable  B/L. Capillary return is within normal limits  bilaterally. Temperature is within normal limits  bilaterally.  Neurologic  Senn-Weinstein monofilament wire test within normal limits  right.  LOPS absent due to brain tumor.. Muscle power within normal limits bilaterally.  Nails Thick disfigured discolored nails with subungual debris  from hallux to fifth toes bilaterally. No evidence of bacterial infection or drainage bilaterally.  Orthopedic  No limitations of motion of motion feet .  No crepitus or effusions noted.  No bony pathology or digital deformities noted. HAV  B/L. Hammer toe second  B/L.  Skin  normotropic skin with no porokeratosis noted bilaterally.  No signs of infections or ulcers noted.     Onychomycosis  Diabetes with no foot complications  IE  Debride nails x 10.  A diabetic foot exam was performed and there is no evidence of any vascular or neurologic pathology.   RTC  prn.   Gardiner Barefoot DPM

## 2021-03-06 ENCOUNTER — Other Ambulatory Visit: Payer: Self-pay | Admitting: Internal Medicine

## 2021-03-06 ENCOUNTER — Other Ambulatory Visit: Payer: Self-pay | Admitting: Family Medicine

## 2021-03-06 DIAGNOSIS — C700 Malignant neoplasm of cerebral meninges: Secondary | ICD-10-CM

## 2021-03-06 DIAGNOSIS — R569 Unspecified convulsions: Secondary | ICD-10-CM

## 2021-03-06 NOTE — Telephone Encounter (Signed)
Requested Prescriptions  Pending Prescriptions Disp Refills  . amLODipine (NORVASC) 5 MG tablet [Pharmacy Med Name: AMLODIPINE BESYLATE 5 MG TAB] 90 tablet 0    Sig: TAKE 1 TABLET (5 MG TOTAL) BY MOUTH DAILY. TO LOWER BLOOD PRESSURE. STOP LOSARTAN     Cardiovascular:  Calcium Channel Blockers Passed - 03/06/2021 10:22 AM      Passed - Last BP in normal range    BP Readings from Last 1 Encounters:  03/04/21 131/77         Passed - Valid encounter within last 6 months    Recent Outpatient Visits          2 months ago Prediabetes   [place], Charlane Ferretti, MD   5 months ago Kidney stone on right side   [place] Jodi Marble, MD   5 months ago Prediabetes   [place], Charlane Ferretti, MD   6 months ago Gastroesophageal reflux disease with esophagitis without hemorrhage   [place] Jodi Marble, MD   7 months ago Prediabetes   [place], Darrick Penna, MD      Future Appointments            In 2 days Marlou Sa, Tonna Corner, MD [facility]   In 5 days [place], Casimer [facility] [place]   In 2 months Charlott Rakes, MD [place]   In 8 months [place], [place], [facility]

## 2021-03-08 ENCOUNTER — Other Ambulatory Visit: Payer: Self-pay

## 2021-03-08 ENCOUNTER — Ambulatory Visit (INDEPENDENT_AMBULATORY_CARE_PROVIDER_SITE_OTHER): Payer: Medicaid Other | Admitting: Orthopedic Surgery

## 2021-03-08 DIAGNOSIS — K529 Noninfective gastroenteritis and colitis, unspecified: Secondary | ICD-10-CM | POA: Diagnosis not present

## 2021-03-08 DIAGNOSIS — Z96611 Presence of right artificial shoulder joint: Secondary | ICD-10-CM

## 2021-03-08 DIAGNOSIS — K219 Gastro-esophageal reflux disease without esophagitis: Secondary | ICD-10-CM | POA: Diagnosis not present

## 2021-03-08 DIAGNOSIS — R195 Other fecal abnormalities: Secondary | ICD-10-CM | POA: Diagnosis not present

## 2021-03-10 ENCOUNTER — Ambulatory Visit: Payer: Medicaid Other | Admitting: Physical Therapy

## 2021-03-10 ENCOUNTER — Ambulatory Visit: Payer: Medicaid Other

## 2021-03-11 ENCOUNTER — Ambulatory Visit: Payer: Medicaid Other | Attending: Physician Assistant | Admitting: Physician Assistant

## 2021-03-11 ENCOUNTER — Other Ambulatory Visit: Payer: Self-pay

## 2021-03-11 ENCOUNTER — Encounter: Payer: Self-pay | Admitting: Physician Assistant

## 2021-03-11 VITALS — BP 118/69 | HR 101 | Ht 69.0 in | Wt 191.1 lb

## 2021-03-11 DIAGNOSIS — Z7984 Long term (current) use of oral hypoglycemic drugs: Secondary | ICD-10-CM | POA: Diagnosis not present

## 2021-03-11 DIAGNOSIS — Z79899 Other long term (current) drug therapy: Secondary | ICD-10-CM | POA: Insufficient documentation

## 2021-03-11 DIAGNOSIS — R7303 Prediabetes: Secondary | ICD-10-CM

## 2021-03-11 DIAGNOSIS — I10 Essential (primary) hypertension: Secondary | ICD-10-CM | POA: Diagnosis not present

## 2021-03-11 DIAGNOSIS — H539 Unspecified visual disturbance: Secondary | ICD-10-CM

## 2021-03-11 DIAGNOSIS — Z7982 Long term (current) use of aspirin: Secondary | ICD-10-CM | POA: Diagnosis not present

## 2021-03-11 DIAGNOSIS — I700 Atherosclerosis of aorta: Secondary | ICD-10-CM | POA: Diagnosis not present

## 2021-03-11 LAB — GLUCOSE, POCT (MANUAL RESULT ENTRY): POC Glucose: 98 mg/dl (ref 70–99)

## 2021-03-11 MED ORDER — ACCU-CHEK SOFTCLIX LANCETS MISC: 12 refills | Status: DC

## 2021-03-11 MED ORDER — METFORMIN HCL 500 MG PO TABS: 500.0000 mg | ORAL_TABLET | Freq: Two times a day (BID) | ORAL | 1 refills | Status: DC

## 2021-03-11 MED ORDER — AMLODIPINE BESYLATE 5 MG PO TABS: ORAL_TABLET | ORAL | 1 refills | Status: DC

## 2021-03-11 MED ORDER — ACCU-CHEK GUIDE VI STRP: ORAL_STRIP | 6 refills | Status: DC

## 2021-03-11 MED ORDER — ATORVASTATIN CALCIUM 20 MG PO TABS: 20.0000 mg | ORAL_TABLET | Freq: Every day | ORAL | 1 refills | Status: DC

## 2021-03-11 NOTE — Progress Notes (Signed)
Patient ID: HAKOP HUMBARGER, male   DOB: 06-20-1961, 60 y.o.   MRN: [id number]   Joshua [name], is a 60 y.o. male  [id number]  [id number]  DOB - Aug 03, 1960  No chief complaint on file.      Subjective:   Joshua [name] is a 60 y.o. male here today for med Rf and thought he was due his A1C-however last A1C was 2 months ago and was 6.3.    He would like a referral to ophthalmology for glasses.    Dr Marlou Sa prescribes psych meds.   No problems updated.  ALLERGIES: No Known Allergies  PAST MEDICAL HISTORY: Past Medical History:  Diagnosis Date   Anxiety    Brain tumor ([place]) 02/20/2013   brain tumor removed in March 2014, Dr Joshua [name]   Diffuse idiopathic skeletal hyperostosis 06/05/2013   Dizziness    Enlarged prostate    GERD (gastroesophageal reflux disease)    Headache(784.0)    scattered   High cholesterol    Hypertension    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[place]) 03/07/2019   Meningioma ([place])    Meningioma, recurrent of brain ([place]) 01/31/2019   Neuropathy 12/05/2019   Pre-diabetes    Rotator cuff tear    right   Seizures ([place])    11/27/19 last sz 1 wk ago    MEDICATIONS AT HOME: Prior to Admission medications   Medication Sig Start Date End Date Taking? Authorizing Provider  acetaminophen (TYLENOL) 500 MG tablet Take 500 mg by mouth every 6 (six) hours as needed for moderate pain or headache.   Yes [provider]  albuterol (VENTOLIN HFA) 108 (90 Base) MCG/ACT inhaler Inhale 2 puffs into the lungs every 6 (six) hours as needed for wheezing or shortness of breath. 09/06/20  Yes Merlyn Lot, MD  aspirin 81 MG chewable tablet Chew 81 mg by mouth daily.   Yes [provider]  bisacodyl (DULCOLAX) 5 MG EC tablet Take 5 mg by mouth daily as needed for moderate constipation.   Yes [provider]  Blood Glucose Monitoring Suppl (ACCU-CHEK GUIDE ME) w/Device KIT Use to check blood sugar once daily. 01/07/21  Yes Charlott Rakes, MD  clonazePAM (KLONOPIN) 0.5 MG tablet Take 1 tablet (0.5 mg total) by mouth 2 (two) times daily. 12/09/20  Yes Vaslow, Acey Lav, MD  docusate sodium (COLACE) 100 MG capsule Take 1 capsule (100 mg total) by mouth 2 (two) times daily. 01/09/21  Yes Meredith Pel, MD  Lacosamide (VIMPAT) 100 MG TABS TAKE 2 TABLETS IN MORNING AND 2 AT BEDTIME 02/12/21  Yes Vaslow, Acey Lav, MD  lamoTRIgine (LAMICTAL) 100 MG tablet TAKE 2 TABLETS BY MOUTH TWICE A DAY 03/09/21  Yes Vaslow, Acey Lav, MD  methocarbamol (ROBAXIN) 500 MG tablet Take 1 tablet (500 mg total) by mouth every 8 (eight) hours as needed for muscle spasms. 01/09/21  Yes Meredith Pel, MD  Misc. Devices MISC Please provide BP cuff for patient 07/08/20  Yes Argentina Donovan, PA-C  pantoprazole (PROTONIX) 20 MG tablet Take 1 tablet (20 mg total) by mouth 2 (two) times daily before a meal. 10/30/81 0/94/07 Yes Jodi Marble, MD  tamsulosin (FLOMAX) 0.4 MG CAPS capsule Take 1 capsule (0.4 mg total) by mouth daily. 07/23/20  Yes Billey Co, MD  VITAMIN A PO Take 1 capsule by mouth daily.   Yes [provider]  Accu-Chek Softclix Lancets lancets Use as instructed 03/11/21   Argentina Donovan, PA-C  amLODipine ([place])  5 MG tablet TAKE 1 TABLET (5 MG TOTAL) BY MOUTH DAILY. TO LOWER BLOOD PRESSURE. STOP LOSARTAN 03/11/21   Argentina Donovan, PA-C  atorvastatin (LIPITOR) 20 MG tablet Take 1 tablet (20 mg total) by mouth daily. 03/11/21   Argentina Donovan, PA-C  glucose blood (ACCU-CHEK GUIDE) test strip Use to check blood sugar once daily. 03/11/21   Argentina Donovan, PA-C  metFORMIN (GLUCOPHAGE) 500 MG tablet Take 1 tablet (500 mg total) by mouth 2 (two) times daily. 03/11/21   Argentina Donovan, PA-C  oxyCODONE (OXY IR/ROXICODONE) 5 MG immediate release tablet Take 1 tablet (5 mg total) by mouth every 8 (eight) hours. Patient not taking: Reported on 03/11/2021 01/26/21   Magnant, Charles L, PA-C    ROS: Neg HEENT Neg  resp Neg cardiac Neg GI Neg GU Neg MS Neg psych Neg neuro  Objective:   Vitals:   03/11/21 1417  BP: 118/69  Pulse: (!) 101  SpO2: (!) 84%  Weight: 191 lb 2 oz (86.7 kg)  Height: 5' 9"  (1.753 m)   Exam General appearance : Awake, alert, not in any distress. Speech Clear. Not toxic looking HEENT: Atraumatic and Normocephalic Neck: Supple, no JVD. No cervical lymphadenopathy.  Chest: Good air entry bilaterally, CTAB.  No rales/rhonchi/wheezing CVS: S1 S2 regular, no murmurs.  Extremities: B/L Lower Ext shows no edema, both legs are warm to touch.  Walks with a cane Neurology: Awake alert, and oriented X 3, CN II-XII intact, Non focal Skin: No Rash TP linear.  J/I is good  Data Review Lab Results  Component Value Date   HGBA1C 6.3 (H) 01/08/2021   HGBA1C 6.2 12/08/2020   HGBA1C 6.2 (A) 07/01/2020    Assessment & Plan   1. Prediabetes Controlled-continue current regimen - Glucose (CBG) - Accu-Chek Softclix Lancets lancets; Use as instructed  Dispense: 100 each; Refill: 12 - glucose blood (ACCU-CHEK GUIDE) test strip; Use to check blood sugar once daily.  Dispense: 100 each; Refill: 6 - metFORMIN (GLUCOPHAGE) 500 MG tablet; Take 1 tablet (500 mg total) by mouth 2 (two) times daily.  Dispense: 180 tablet; Refill: 1 I have had a lengthy discussion and provided education about insulin resistance and the intake of too much sugar/refined carbohydrates.  I have advised the patient to work at a goal of eliminating sugary drinks, candy, desserts, sweets, refined sugars, processed foods, and white carbohydrates.  The patient expresses understanding.    2. Aortic atherosclerosis (HCC) - atorvastatin (LIPITOR) 20 MG tablet; Take 1 tablet (20 mg total) by mouth daily.  Dispense: 90 tablet; Refill: 1  3. Essential hypertension controlled - amLODipine (NORVASC) 5 MG tablet; TAKE 1 TABLET (5 MG TOTAL) BY MOUTH DAILY. TO LOWER BLOOD PRESSURE. STOP LOSARTAN  Dispense: 90 tablet;  Refill: 1  4. Vision changes - Ambulatory referral to Ophthalmology    Patient have been counseled extensively about nutrition and exercise. Other issues discussed during this visit include: low cholesterol diet, weight control and daily exercise, foot care, annual eye examinations at Ophthalmology, importance of adherence with medications and regular follow-up. We also discussed long term complications of uncontrolled diabetes and hypertension.   Return for 4 months with Dr Margarita Rana for A1C and chronic conditions.  The patient was given clear instructions to go to ER or return to medical center if symptoms don't improve, worsen or new problems develop. The patient verbalized understanding. The patient was told to call to get lab results if they haven't heard anything in the next  week.      Freeman Caldron, PA-C [facility] [place], [place]   03/11/2021, 2:33 PM

## 2021-03-12 ENCOUNTER — Encounter: Payer: Self-pay | Admitting: Orthopedic Surgery

## 2021-03-12 NOTE — Progress Notes (Signed)
Post-Op Visit Note   Patient: Joshua [name]           Date of Birth: 1961-03-23           MRN: [id number] Visit Date: 03/08/2021 PCP: Charlott Rakes, MD   Assessment & Plan:  Chief Complaint:  Chief Complaint  Patient presents with   Other    01/08/21 Right shoulder arthroplasty   Visit Diagnoses:  1. History of arthroplasty of right shoulder     Plan: Liberato is a 60 year old patient who underwent right reverse shoulder replacement 01/08/2021.  He has been doing well.  Goes to therapy once a week.  He is staying in [place].  On examination he has forward flexion abduction above 90.  Deltoid functions well.  Incision is intact.  He has discontinued pain medicine.  Using ice.  Taking Tylenol.  15 pound lifting limit reviewed.  Plan to see Lurena Joiner in 6 weeks for clinical recheck and release  Follow-Up Instructions: Return in about 6 weeks (around 04/19/2021).   Orders:  No orders of the defined types were placed in this encounter.  No orders of the defined types were placed in this encounter.   Imaging: No results found.  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[place]) 03/07/2019   Seizures ([place]) 01/31/2019   Meningioma, recurrent of brain ([place]) 01/31/2019   Meningioma ([place]) 01/03/2019   Diffuse idiopathic skeletal hyperostosis 06/05/2013   Past Medical History:  Diagnosis Date   Anxiety    Brain tumor ([place]) 02/20/2013   brain tumor removed in March 2014, Dr Donald Pore   Diffuse idiopathic skeletal hyperostosis 06/05/2013   Dizziness     Enlarged prostate    GERD (gastroesophageal reflux disease)    Headache(784.0)    scattered   High cholesterol    Hypertension    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[place]) 03/07/2019   Meningioma ([facility])    Meningioma, recurrent of brain ([place]) 01/31/2019   Neuropathy 12/05/2019   Pre-diabetes    Rotator cuff tear    right   Seizures ([place])    11/27/19 last sz 1 wk ago    Family History  Problem Relation Age of Onset   Hypertension Mother    Dementia Mother    Diabetes Mother    Hypertension Father    CAD Father    Diabetes Father    CAD Brother     Past Surgical History:  Procedure Laterality Date   APPLICATION OF CRANIAL NAVIGATION N/A 01/10/2019   Procedure: APPLICATION OF CRANIAL NAVIGATION;  Surgeon: Erline Levine, MD;  Location: [facility];  Service: Neurosurgery;  Laterality: N/A;   CATARACT EXTRACTION Right    CRANIOTOMY Right 11/06/2012   Procedure: CRANIOTOMY TUMOR EXCISION;  Surgeon: Erline Levine, MD;  Location: [facility];  Service: Neurosurgery;  Laterality: Right;  Right Parasagittal craniotomy for meningioma with Stealth   CRANIOTOMY Right 01/10/2019   Procedure: Right Parasagittal Craniotomy for Tumor;  Surgeon: Erline Levine, MD;  Location: [facility];  Service: Neurosurgery;  Laterality: Right;  Right parasagittal craniotomy for  tumor   ESOPHAGOGASTRODUODENOSCOPY     JOINT REPLACEMENT Right 2018   right hip   REVERSE SHOULDER ARTHROPLASTY Right 01/08/2021   Procedure: RIGHT REVERSE SHOULDER ARTHROPLASTY;  Surgeon: Meredith Pel, MD;  Location: [facility];  Service: Orthopedics;  Laterality: Right;   right knee arthroscopy     SHOULDER ARTHROSCOPY Right 06/09/2020   Procedure: RIGHT SHOULDER ARTHROSCOPY AND DEBRIDEMENT;  Surgeon: Newt Minion, MD;  Location: [facility];  Service: Orthopedics;  Laterality: Right;   Social History   Occupational History   Not on file  Tobacco Use   Smoking status: Former    Types: Cigarettes    Quit date:  01/29/2019    Years since quitting: 2.1   Smokeless tobacco: Never   Tobacco comments:    weekend smoker  Vaping Use   Vaping Use: Never used  Substance and Sexual Activity   Alcohol use: Not Currently    Comment: social   Drug use: No   Sexual activity: Not Currently

## 2021-03-15 ENCOUNTER — Telehealth: Payer: Self-pay | Admitting: Family Medicine

## 2021-03-15 ENCOUNTER — Other Ambulatory Visit: Payer: Self-pay

## 2021-03-15 NOTE — Telephone Encounter (Signed)
Copied from [place] [id number]. Topic: Referral - Status >> Mar 12, 2021  4:35 PM Erick Blinks wrote: Reason for CRM: Pt has accepted another appt for his referral so he can cancel the current referral. [facility] [place] [facility] [place] and was able to be seen sooner

## 2021-03-17 ENCOUNTER — Ambulatory Visit: Payer: Medicaid Other

## 2021-03-17 ENCOUNTER — Ambulatory Visit: Payer: Medicaid Other | Admitting: Physical Therapy

## 2021-03-19 ENCOUNTER — Emergency Department
Admission: EM | Admit: 2021-03-19 | Discharge: 2021-03-19 | Disposition: A | Discharge status: Home / Self Care | Payer: Medicaid Other | Attending: Emergency Medicine | Admitting: Emergency Medicine

## 2021-03-19 ENCOUNTER — Emergency Department: Payer: Medicaid Other

## 2021-03-19 ENCOUNTER — Other Ambulatory Visit: Payer: Self-pay

## 2021-03-19 DIAGNOSIS — Z85841 Personal history of malignant neoplasm of brain: Secondary | ICD-10-CM | POA: Insufficient documentation

## 2021-03-19 DIAGNOSIS — Z87891 Personal history of nicotine dependence: Secondary | ICD-10-CM | POA: Diagnosis not present

## 2021-03-19 DIAGNOSIS — R519 Headache, unspecified: Secondary | ICD-10-CM | POA: Insufficient documentation

## 2021-03-19 DIAGNOSIS — Z7982 Long term (current) use of aspirin: Secondary | ICD-10-CM | POA: Diagnosis not present

## 2021-03-19 DIAGNOSIS — I10 Essential (primary) hypertension: Secondary | ICD-10-CM | POA: Diagnosis not present

## 2021-03-19 DIAGNOSIS — Z96611 Presence of right artificial shoulder joint: Secondary | ICD-10-CM | POA: Insufficient documentation

## 2021-03-19 DIAGNOSIS — R42 Dizziness and giddiness: Secondary | ICD-10-CM | POA: Diagnosis not present

## 2021-03-19 DIAGNOSIS — Z79899 Other long term (current) drug therapy: Secondary | ICD-10-CM | POA: Insufficient documentation

## 2021-03-19 LAB — BASIC METABOLIC PANEL
Anion gap: 7 (ref 5–15)
BUN: 13 mg/dL (ref 6–20)
CO2: 28 mmol/L (ref 22–32)
Calcium: 9.5 mg/dL (ref 8.9–10.3)
Chloride: 105 mmol/L (ref 98–111)
Creatinine, Ser: 0.89 mg/dL (ref 0.61–1.24)
GFR, Estimated: >60 mL/min (ref >60)
Glucose, Bld: 115 mg/dL — ABNORMAL HIGH (ref 70–99)
Potassium: 4 mmol/L (ref 3.5–5.1)
Sodium: 140 mmol/L (ref 135–145)

## 2021-03-19 LAB — CBC
HCT: 46.6 % (ref 39.0–52.0)
Hemoglobin: 16.1 g/dL (ref 13.0–17.0)
MCH: 31 pg (ref 26.0–34.0)
MCHC: 34.5 g/dL (ref 30.0–36.0)
MCV: 89.6 fL (ref 80.0–100.0)
Platelets: 253 10*3/uL (ref 150–400)
RBC: 5.2 MIL/uL (ref 4.22–5.81)
RDW: 13.2 % (ref 11.5–15.5)
WBC: 6.3 10*3/uL (ref 4.0–10.5)
nRBC: 0 % (ref 0.0–0.2)

## 2021-03-19 LAB — APTT: aPTT: 28 seconds (ref 24–36)

## 2021-03-19 MED ORDER — BUTALBITAL-APAP-CAFFEINE 50-325-40 MG PO TABS: 1.0000 | ORAL_TABLET | Freq: Four times a day (QID) | ORAL | 0 refills | Status: DC | PRN

## 2021-03-19 NOTE — Discharge Instructions (Addendum)
Please take your prescribed medication if needed for headaches.  Please follow-up with your doctor for recheck/reevaluation for ongoing care.  Return to the emergency department for any weakness or numbness of any arm or leg confusion or slurred speech.

## 2021-03-19 NOTE — ED Triage Notes (Addendum)
Pt here with dizziness and blurred vision that started Wed. Pt thinks that it may be related to his medication and decided to wait before coming to the ED. Pt states that he took a muscle relaxer and thinks that may be the cause. Only other symptom is a headache. Pt denies weakness on either side.

## 2021-03-19 NOTE — ED Provider Notes (Signed)
[facility] Emergency Department Provider Note  Time seen: 6:19 PM  I have reviewed the triage vital signs and the nursing notes.   HISTORY  Chief Complaint Blurred Vision and Dizziness   HPI Joshua [name] is a 60 y.o. male with a past medical history of anxiety, brain tumor/meningioma resection 2014, gastric reflux, hypertension, hyperlipidemia, seizures, presents to the emergency department for dizziness.  According to the patient for the last week or so he has been experiencing fairly frequent headaches.  States he has been using a muscle relaxer and Tylenol for the headache which he states is helping but he has noticed for the past 3 days he has been very dizzy and off balance as well but admits each time has been after taking the muscle relaxer.  Denies any nausea vomiting or diarrhea.  Denies any weakness or numbness of any arm or leg confusion or slurred speech.   Past Medical History:  Diagnosis Date   Anxiety    Brain tumor ([place]) 02/20/2013   brain tumor removed in March 2014, Dr Donald Pore   Diffuse idiopathic skeletal hyperostosis 06/05/2013   Dizziness    Enlarged prostate    GERD (gastroesophageal reflux disease)    Headache(784.0)    scattered   High cholesterol    Hypertension    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[place]) 03/07/2019   Meningioma ([place])    Meningioma, recurrent of brain ([place]) 01/31/2019   Neuropathy 12/05/2019   Pre-diabetes    Rotator cuff tear    right   Seizures ([place])    11/27/19 last sz 1 wk ago    Patient Active Problem List   Diagnosis Date Noted   Pain due to onychomycosis of toenails of both feet 03/04/2021   Arthritis of right shoulder region    S/P reverse total shoulder arthroplasty, 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   Essential hypertension 07/02/2020   Prediabetes 07/02/2020   Polyuria 07/02/2020   Nontraumatic complete tear of right rotator cuff    Impingement syndrome of right shoulder    Neuropathy 12/05/2019    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[place]) 03/07/2019   Seizures ([place]) 01/31/2019   Meningioma, recurrent of brain ([place]) 01/31/2019   Meningioma ([place]) 01/03/2019   Diffuse idiopathic skeletal hyperostosis 06/05/2013    Past Surgical History:  Procedure Laterality Date   APPLICATION OF CRANIAL NAVIGATION N/A 01/10/2019   Procedure: APPLICATION OF CRANIAL NAVIGATION;  Surgeon: Erline Levine, MD;  Location: [facility];  Service: Neurosurgery;  Laterality: N/A;   CATARACT EXTRACTION Right    CRANIOTOMY Right 11/06/2012   Procedure: CRANIOTOMY TUMOR EXCISION;  Surgeon: Erline Levine, MD;  Location: [facility];  Service: Neurosurgery;  Laterality: Right;  Right Parasagittal craniotomy for meningioma with Stealth   CRANIOTOMY Right 01/10/2019   Procedure: Right Parasagittal Craniotomy for Tumor;  Surgeon: Erline Levine, MD;  Location: [facility];  Service: Neurosurgery;  Laterality: Right;  Right parasagittal craniotomy for tumor   ESOPHAGOGASTRODUODENOSCOPY     JOINT REPLACEMENT Right 2018   right hip   REVERSE SHOULDER ARTHROPLASTY Right 01/08/2021   Procedure: RIGHT REVERSE SHOULDER ARTHROPLASTY;  Surgeon: Meredith Pel, MD;  Location: [facility];  Service: Orthopedics;  Laterality: Right;   right knee arthroscopy     SHOULDER ARTHROSCOPY Right 06/09/2020   Procedure: RIGHT SHOULDER ARTHROSCOPY AND DEBRIDEMENT;  Surgeon: Newt Minion, MD;  Location: [facility];  Service: Orthopedics;  Laterality: Right;    Prior to Admission medications   Medication Sig  Start Date End Date Taking? Authorizing Provider  Accu-Chek Softclix Lancets lancets Use as instructed 03/11/21   Argentina Donovan, PA-C  acetaminophen (TYLENOL) 500 MG tablet Take 500 mg by mouth every 6 (six) hours as needed for moderate pain or headache.    [provider]  albuterol (VENTOLIN HFA) 108 (90 Base) MCG/ACT inhaler Inhale 2 puffs into the lungs every 6 (six) hours as needed for wheezing or shortness of  breath. 09/06/20   Merlyn Lot, MD  amLODipine (NORVASC) 5 MG tablet TAKE 1 TABLET (5 MG TOTAL) BY MOUTH DAILY. TO LOWER BLOOD PRESSURE. STOP LOSARTAN 03/11/21   Argentina Donovan, PA-C  aspirin 81 MG chewable tablet Chew 81 mg by mouth daily.    [provider]  atorvastatin (LIPITOR) 20 MG tablet Take 1 tablet (20 mg total) by mouth daily. 03/11/21   Argentina Donovan, PA-C  bisacodyl (DULCOLAX) 5 MG EC tablet Take 5 mg by mouth daily as needed for moderate constipation.    [provider]  Blood Glucose Monitoring Suppl (ACCU-CHEK GUIDE ME) w/Device KIT Use to check blood sugar once daily. 01/07/21   Charlott Rakes, MD  clonazePAM (KLONOPIN) 0.5 MG tablet Take 1 tablet (0.5 mg total) by mouth 2 (two) times daily. 12/09/20   [name], MD  docusate sodium (COLACE) 100 MG capsule Take 1 capsule (100 mg total) by mouth 2 (two) times daily. 01/09/21   Meredith Pel, MD  glucose blood (ACCU-CHEK GUIDE) test strip Use to check blood sugar once daily. 03/11/21   Argentina Donovan, PA-C  Lacosamide (VIMPAT) 100 MG TABS TAKE 2 TABLETS IN MORNING AND 2 AT BEDTIME 02/12/21   Vaslow, Acey Lav, MD  lamoTRIgine (LAMICTAL) 100 MG tablet TAKE 2 TABLETS BY MOUTH TWICE A DAY 03/09/21   Vaslow, Acey Lav, MD  metFORMIN (GLUCOPHAGE) 500 MG tablet Take 1 tablet (500 mg total) by mouth 2 (two) times daily. 03/11/21   Argentina Donovan, PA-C  methocarbamol (ROBAXIN) 500 MG tablet Take 1 tablet (500 mg total) by mouth every 8 (eight) hours as needed for muscle spasms. 01/09/21   Meredith Pel, MD  Misc. Devices MISC Please provide BP cuff for patient 07/08/20   Argentina Donovan, PA-C  oxyCODONE (OXY IR/ROXICODONE) 5 MG immediate release tablet Take 1 tablet (5 mg total) by mouth every 8 (eight) hours. Patient not taking: Reported on 03/11/2021 01/26/21   Magnant, Gerrianne Scale, PA-C  pantoprazole (PROTONIX) 20 MG tablet Take 1 tablet (20 mg total) by mouth 2 (two) times daily before a meal.  02/25/90 4/78/29  Jodi Marble, MD  tamsulosin (FLOMAX) 0.4 MG CAPS capsule Take 1 capsule (0.4 mg total) by mouth daily. 07/23/20   Billey Co, MD  VITAMIN A PO Take 1 capsule by mouth daily.    [provider]    No Known Allergies  Family History  Problem Relation Age of Onset   Hypertension Mother    Dementia Mother    Diabetes Mother    Hypertension Father    CAD Father    Diabetes Father    CAD Brother     Social History Social History   Tobacco Use   Smoking status: Former    Types: Cigarettes    Quit date: 01/29/2019    Years since quitting: 2.1   Smokeless tobacco: Never   Tobacco comments:    weekend smoker  Vaping Use   Vaping Use: Never used  Substance Use Topics  Alcohol use: Not Currently    Comment: social   Drug use: No    Review of Systems Constitutional: Negative for fever.  Intermittent dizziness. Cardiovascular: Negative for chest pain. Respiratory: Negative for shortness of breath. Gastrointestinal: Negative for abdominal pain, vomiting and diarrhea. Genitourinary: Negative for urinary compaints Musculoskeletal: Negative for musculoskeletal complaints Skin: Negative for skin complaints  Neurological: Negative for headache.  Negative for weakness numbness confusion or slurred speech. All other ROS negative  ____________________________________________   PHYSICAL EXAM:  VITAL SIGNS: ED Triage Vitals [03/19/21 1426]  Enc Vitals Group     BP (!) 151/88     Pulse Rate 97     Resp 16     Temp 98.5 F (36.9 C)     Temp Source Oral     SpO2 97 %     Weight 180 lb (81.6 kg)     Height 5' 8"  (1.727 m)     Head Circumference      Peak Flow      Pain Score 7     Pain Loc      Pain Edu?      Excl. in [place]?    Constitutional: Alert and oriented. Well appearing and in no distress. Eyes: Normal exam ENT      Head: Normocephalic and atraumatic.      Mouth/Throat: Mucous membranes are moist. Cardiovascular: Normal rate,  regular rhythm. Respiratory: Normal respiratory effort without tachypnea nor retractions. Breath sounds are clear Gastrointestinal: Soft and nontender. No distention Musculoskeletal: Nontender with normal range of motion in all extremities Neurologic:  Normal speech and language. No gross focal neurologic deficits.  Cranial nerves intact.  Equal grip strength.  No pronator drift.  Finger-to-nose testing intact.  5/5 motor in all extremities. Skin:  Skin is warm, dry and intact.  Psychiatric: Mood and affect are normal.   ____________________________________________    EKG  EKG viewed and interpreted by myself shows a normal sinus rhythm at 97 bpm with a widened QRS, normal axis, largely normal intervals.  Nonspecific but no concerning ST changes.  ____________________________________________    RADIOLOGY  IMPRESSION: 1. No CT evidence for acute intracranial abnormality. 2. Postsurgical changes at the cranial vertex from prior meningioma resection with adjacent encephalomalacia. 3. Chronic small vessel ischemic changes of the white matter  ____________________________________________   INITIAL IMPRESSION / ASSESSMENT AND PLAN / ED COURSE  Pertinent labs & imaging results that were available during my care of the patient were reviewed by me and considered in my medical decision making (see chart for details).   Patient presents emergency department for intermittent dizziness.  Patient has been using muscle relaxers for his headaches, but states he feels dizzy and off balance at times.  Patient was concerned given the dizziness and his history of meningiomas in the past.  Patient denies any dizziness currently.  CT scan has resulted showing no acute abnormality.  Postsurgical changes from prior meningioma resection with adjacent encephalomalacia.  Given the patient's reassuring physical exam, CT scan of the head and lab work I believe the patient is safe for discharge home.  Discussed  with the patient to discontinue muscle relaxer.  We will prescribe Fioricet to be used if needed for headaches and have the patient follow-up with his doctor.  Patient agreeable to plan of care.  Discussed return precautions.  Joshua [name] was evaluated in Emergency Department on 03/19/2021 for the symptoms described in the history of present illness. He was evaluated in the context  of the global COVID-19 pandemic, which necessitated consideration that the patient might be at risk for infection with the SARS-CoV-2 virus that causes COVID-19. Institutional protocols and algorithms that pertain to the evaluation of patients at risk for COVID-19 are in a state of rapid change based on information released by regulatory bodies including the CDC and federal and state organizations. These policies and algorithms were followed during the patient's care in the ED.  ____________________________________________   FINAL CLINICAL IMPRESSION(S) / ED DIAGNOSES  Dizziness   Harvest Dark, MD 03/19/21 [phone]

## 2021-03-24 ENCOUNTER — Ambulatory Visit: Payer: Medicaid Other | Admitting: Physical Therapy

## 2021-03-26 ENCOUNTER — Ambulatory Visit: Payer: Medicaid Other

## 2021-03-26 ENCOUNTER — Other Ambulatory Visit: Payer: Self-pay

## 2021-03-26 ENCOUNTER — Ambulatory Visit (HOSPITAL_COMMUNITY)
Admission: RE | Admit: 2021-03-26 | Discharge: 2021-03-26 | Disposition: A | Discharge status: Home / Self Care | Payer: Medicaid Other | Source: Ambulatory Visit | Attending: Internal Medicine | Admitting: Internal Medicine

## 2021-03-26 DIAGNOSIS — C700 Malignant neoplasm of cerebral meninges: Secondary | ICD-10-CM | POA: Diagnosis not present

## 2021-03-26 DIAGNOSIS — G9389 Other specified disorders of brain: Secondary | ICD-10-CM | POA: Diagnosis not present

## 2021-03-26 DIAGNOSIS — J3489 Other specified disorders of nose and nasal sinuses: Secondary | ICD-10-CM | POA: Diagnosis not present

## 2021-03-26 DIAGNOSIS — D496 Neoplasm of unspecified behavior of brain: Secondary | ICD-10-CM | POA: Diagnosis not present

## 2021-03-26 DIAGNOSIS — C719 Malignant neoplasm of brain, unspecified: Secondary | ICD-10-CM | POA: Diagnosis not present

## 2021-03-26 MED ORDER — GADOBUTROL 1 MMOL/ML IV SOLN
8.0000 mL | Freq: Once | INTRAVENOUS | Status: AC | PRN
  Administered 2021-03-26: 8 mL via INTRAVENOUS

## 2021-03-29 ENCOUNTER — Inpatient Hospital Stay: Payer: Medicaid Other | Attending: Internal Medicine

## 2021-03-30 ENCOUNTER — Telehealth: Payer: Self-pay | Admitting: Internal Medicine

## 2021-03-30 ENCOUNTER — Ambulatory Visit: Payer: Medicaid Other | Admitting: Internal Medicine

## 2021-03-30 NOTE — Telephone Encounter (Signed)
Scheduled per sch msg. Called and left msg  

## 2021-03-31 ENCOUNTER — Emergency Department: Payer: Medicaid Other

## 2021-03-31 ENCOUNTER — Other Ambulatory Visit: Payer: Self-pay

## 2021-03-31 ENCOUNTER — Telehealth: Payer: Self-pay | Admitting: Family Medicine

## 2021-03-31 ENCOUNTER — Ambulatory Visit: Payer: Medicaid Other | Admitting: Physical Therapy

## 2021-03-31 ENCOUNTER — Emergency Department
Admission: EM | Admit: 2021-03-31 | Discharge: 2021-03-31 | Disposition: A | Discharge status: Home / Self Care | Payer: Medicaid Other | Attending: Emergency Medicine | Admitting: Emergency Medicine

## 2021-03-31 ENCOUNTER — Encounter: Payer: Self-pay | Admitting: *Deleted

## 2021-03-31 DIAGNOSIS — Z96611 Presence of right artificial shoulder joint: Secondary | ICD-10-CM | POA: Insufficient documentation

## 2021-03-31 DIAGNOSIS — R0789 Other chest pain: Secondary | ICD-10-CM | POA: Diagnosis not present

## 2021-03-31 DIAGNOSIS — Z96641 Presence of right artificial hip joint: Secondary | ICD-10-CM | POA: Insufficient documentation

## 2021-03-31 DIAGNOSIS — Z79899 Other long term (current) drug therapy: Secondary | ICD-10-CM | POA: Diagnosis not present

## 2021-03-31 DIAGNOSIS — Z87891 Personal history of nicotine dependence: Secondary | ICD-10-CM | POA: Insufficient documentation

## 2021-03-31 DIAGNOSIS — R079 Chest pain, unspecified: Secondary | ICD-10-CM

## 2021-03-31 DIAGNOSIS — I10 Essential (primary) hypertension: Secondary | ICD-10-CM | POA: Insufficient documentation

## 2021-03-31 DIAGNOSIS — Z7982 Long term (current) use of aspirin: Secondary | ICD-10-CM | POA: Insufficient documentation

## 2021-03-31 DIAGNOSIS — Z86011 Personal history of benign neoplasm of the brain: Secondary | ICD-10-CM | POA: Diagnosis not present

## 2021-03-31 LAB — CBC
HCT: 42.9 % (ref 39.0–52.0)
Hemoglobin: 15.1 g/dL (ref 13.0–17.0)
MCH: 31.4 pg (ref 26.0–34.0)
MCHC: 35.2 g/dL (ref 30.0–36.0)
MCV: 89.2 fL (ref 80.0–100.0)
Platelets: 250 10*3/uL (ref 150–400)
RBC: 4.81 MIL/uL (ref 4.22–5.81)
RDW: 13.1 % (ref 11.5–15.5)
WBC: 6.4 10*3/uL (ref 4.0–10.5)
nRBC: 0 % (ref 0.0–0.2)

## 2021-03-31 LAB — T4, FREE: Free T4: 0.9 ng/dL (ref 0.61–1.12)

## 2021-03-31 LAB — BASIC METABOLIC PANEL
Anion gap: 8 (ref 5–15)
BUN: 13 mg/dL (ref 6–20)
CO2: 25 mmol/L (ref 22–32)
Calcium: 9.2 mg/dL (ref 8.9–10.3)
Chloride: 108 mmol/L (ref 98–111)
Creatinine, Ser: 0.78 mg/dL (ref 0.61–1.24)
GFR, Estimated: >60 mL/min (ref >60)
Glucose, Bld: 138 mg/dL — ABNORMAL HIGH (ref 70–99)
Potassium: 3.8 mmol/L (ref 3.5–5.1)
Sodium: 141 mmol/L (ref 135–145)

## 2021-03-31 LAB — TROPONIN I (HIGH SENSITIVITY)
Troponin I (High Sensitivity): 5 ng/L (ref <18)
Troponin I (High Sensitivity): 5 ng/L (ref <18)

## 2021-03-31 LAB — TSH: TSH: 2.864 u[IU]/mL (ref 0.350–4.500)

## 2021-03-31 LAB — D-DIMER, QUANTITATIVE: D-Dimer, Quant: 1.21 ug/mL-FEU — ABNORMAL HIGH (ref 0.00–0.50)

## 2021-03-31 MED ORDER — IOHEXOL 350 MG/ML SOLN
75.0000 mL | Freq: Once | INTRAVENOUS | Status: AC | PRN
  Administered 2021-03-31: 75 mL via INTRAVENOUS

## 2021-03-31 NOTE — Telephone Encounter (Signed)
Copied from [place] [id number]. Topic: General - Other >> Mar 31, 2021  3:08 PM Celene Kras wrote: Reason for CRM: Pt called stating that he is still having trouble with his eyes and is not able to see. He states he saw the ophthalmologist, but they do not have another appt until November. He is requesting to see if anything can be done. Please advise.

## 2021-03-31 NOTE — ED Provider Notes (Signed)
[facility] Emergency Department Provider Note   ____________________________________________   Event Date/Time   First MD Initiated Contact with Patient 03/31/21 0423     (approximate)  I have reviewed the triage vital signs and the nursing notes.   HISTORY  Chief Complaint Chest Pain    HPI Joshua [name] is a 60 y.o. male who presents to the ED from home with a chief complaint of chest pain.  Patient with a history of anxiety, brain tumor, seizure risk, GERD who presents with left chest tightness x2 days.  Denies associated radiation, diaphoresis, nausea/vomiting or dizziness.  Sometimes feels a fluttering sensation.  Has tried to change the timing that he takes his medications.  Denies new medications or dosage adjustments.  Wonders if his symptoms are secondary to anxiety as he is currently moving.  Right shoulder surgery 1 month ago.  Denies fever, cough, shortness of breath, abdominal pain, dysuria.  Denies COVID-19 exposure.     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   Meningioma ([place])    Meningioma, recurrent of brain ([place]) 01/31/2019   Neuropathy 12/05/2019   Pre-diabetes    Rotator cuff tear    right   Seizures ([place])    11/27/19 last sz 1 wk ago    Patient Active Problem List   Diagnosis Date Noted   Pain due to onychomycosis of toenails of both feet 03/04/2021   Arthritis of right shoulder region    S/P reverse total shoulder arthroplasty, 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   Essential hypertension 07/02/2020   Prediabetes 07/02/2020   Polyuria 07/02/2020   Nontraumatic complete tear of right rotator  cuff    Impingement syndrome of right shoulder    Neuropathy 12/05/2019   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   Seizures ([place]) 01/31/2019   Meningioma, recurrent of brain ([place]) 01/31/2019   Meningioma ([place]) 01/03/2019   Diffuse idiopathic skeletal hyperostosis 06/05/2013    Past Surgical History:  Procedure Laterality Date   APPLICATION OF CRANIAL NAVIGATION N/A 01/10/2019   Procedure: APPLICATION OF CRANIAL NAVIGATION;  Surgeon: Erline Levine, MD;  Location: [facility];  Service: Neurosurgery;  Laterality: N/A;   CATARACT EXTRACTION Right    CRANIOTOMY Right 11/06/2012   Procedure: CRANIOTOMY TUMOR EXCISION;  Surgeon: Erline Levine, MD;  Location: [place] [facility];  Service: Neurosurgery;  Laterality: Right;  Right Parasagittal craniotomy for meningioma with Stealth   CRANIOTOMY Right 01/10/2019   Procedure: Right Parasagittal Craniotomy for Tumor;  Surgeon: Erline Levine, MD;  Location: [facility];  Service: Neurosurgery;  Laterality: Right;  Right parasagittal craniotomy for tumor   ESOPHAGOGASTRODUODENOSCOPY     JOINT REPLACEMENT Right 2018   right hip   REVERSE SHOULDER ARTHROPLASTY Right 01/08/2021   Procedure: RIGHT REVERSE SHOULDER ARTHROPLASTY;  Surgeon: Meredith Pel, MD;  Location: [facility];  Service: Orthopedics;  Laterality: Right;   right knee arthroscopy     SHOULDER ARTHROSCOPY Right 06/09/2020   Procedure: RIGHT SHOULDER ARTHROSCOPY AND DEBRIDEMENT;  Surgeon: Newt Minion, MD;  Location: [facility];  Service: Orthopedics;  Laterality: Right;  Prior to Admission medications   Medication Sig Start Date End Date Taking? Authorizing Provider  Accu-Chek Softclix Lancets lancets Use as instructed 03/11/21   Argentina Donovan, PA-C  acetaminophen (TYLENOL) 500 MG tablet Take 500 mg by mouth every 6 (six) hours as needed for moderate pain or headache.    [provider]  albuterol (VENTOLIN HFA) 108 (90 Base) MCG/ACT inhaler Inhale 2  puffs into the lungs every 6 (six) hours as needed for wheezing or shortness of breath. 09/06/20   Merlyn Lot, MD  amLODipine (NORVASC) 5 MG tablet TAKE 1 TABLET (5 MG TOTAL) BY MOUTH DAILY. TO LOWER BLOOD PRESSURE. STOP LOSARTAN 03/11/21   Argentina Donovan, PA-C  aspirin 81 MG chewable tablet Chew 81 mg by mouth daily.    [provider]  atorvastatin (LIPITOR) 20 MG tablet Take 1 tablet (20 mg total) by mouth daily. 03/11/21   Argentina Donovan, PA-C  bisacodyl (DULCOLAX) 5 MG EC tablet Take 5 mg by mouth daily as needed for moderate constipation.    [provider]  Blood Glucose Monitoring Suppl (ACCU-CHEK GUIDE ME) w/Device KIT Use to check blood sugar once daily. 01/07/21   Charlott Rakes, MD  butalbital-acetaminophen-caffeine (FIORICET) 50-325-40 MG tablet Take 1-2 tablets by mouth every 6 (six) hours as needed for headache. 03/19/21 03/19/22  Harvest Dark, MD  clonazePAM (KLONOPIN) 0.5 MG tablet Take 1 tablet (0.5 mg total) by mouth 2 (two) times daily. 12/09/20   [name], MD  docusate sodium (COLACE) 100 MG capsule Take 1 capsule (100 mg total) by mouth 2 (two) times daily. 01/09/21   Meredith Pel, MD  glucose blood (ACCU-CHEK GUIDE) test strip Use to check blood sugar once daily. 03/11/21   Argentina Donovan, PA-C  Lacosamide (VIMPAT) 100 MG TABS TAKE 2 TABLETS IN MORNING AND 2 AT BEDTIME 02/12/21   Vaslow, Acey Lav, MD  lamoTRIgine (LAMICTAL) 100 MG tablet TAKE 2 TABLETS BY MOUTH TWICE A DAY 03/09/21   Vaslow, Acey Lav, MD  metFORMIN (GLUCOPHAGE) 500 MG tablet Take 1 tablet (500 mg total) by mouth 2 (two) times daily. 03/11/21   Argentina Donovan, PA-C  methocarbamol (ROBAXIN) 500 MG tablet Take 1 tablet (500 mg total) by mouth every 8 (eight) hours as needed for muscle spasms. 01/09/21   Meredith Pel, MD  Misc. Devices MISC Please provide BP cuff for patient 07/08/20   Argentina Donovan, PA-C  oxyCODONE (OXY IR/ROXICODONE) 5 MG immediate release  tablet Take 1 tablet (5 mg total) by mouth every 8 (eight) hours. Patient not taking: Reported on 03/11/2021 01/26/21   Magnant, Gerrianne Scale, PA-C  pantoprazole (PROTONIX) 20 MG tablet Take 1 tablet (20 mg total) by mouth 2 (two) times daily before a meal. 0/08/67 01/03/49  Jodi Marble, MD  tamsulosin (FLOMAX) 0.4 MG CAPS capsule Take 1 capsule (0.4 mg total) by mouth daily. 07/23/20   Billey Co, MD  VITAMIN A PO Take 1 capsule by mouth daily.    [provider]    Allergies Patient has no known allergies.  Family History  Problem Relation Age of Onset   Hypertension Mother    Dementia Mother    Diabetes Mother    Hypertension Father    CAD Father    Diabetes Father    CAD Brother     Social History Social History   Tobacco Use   Smoking status: Former    Types: Cigarettes    Quit date: 01/29/2019  Years since quitting: 2.1   Smokeless tobacco: Never   Tobacco comments:    weekend smoker  Vaping Use   Vaping Use: Never used  Substance Use Topics   Alcohol use: Not Currently    Comment: social   Drug use: No    Review of Systems  Constitutional: No fever/chills Eyes: No visual changes. ENT: No sore throat. Cardiovascular: Positive for chest pain and occasional fluttering sensation. Respiratory: Denies shortness of breath. Gastrointestinal: No abdominal pain.  No nausea, no vomiting.  No diarrhea.  No constipation. Genitourinary: Negative for dysuria. Musculoskeletal: Negative for back pain. Skin: Negative for rash. Neurological: Negative for headaches, focal weakness or numbness.   ____________________________________________   PHYSICAL EXAM:  VITAL SIGNS: ED Triage Vitals  Enc Vitals Group     BP 03/31/21 0205 134/77     Pulse Rate 03/31/21 0205 73     Resp 03/31/21 0205 18     Temp 03/31/21 0205 98.8 F (37.1 C)     Temp Source 03/31/21 0205 Oral     SpO2 03/31/21 0205 100 %     Weight 03/31/21 0201 175 lb (79.4 kg)     Height  03/31/21 0201 5' 8" (1.727 m)     Head Circumference --      Peak Flow --      Pain Score 03/31/21 0201 5     Pain Loc --      Pain Edu? --      Excl. in [place]? --     Constitutional: Alert and oriented. Well appearing and in no acute distress. Eyes: Conjunctivae are normal. PERRL. EOMI. Head: Atraumatic. Nose: No congestion/rhinnorhea. Mouth/Throat: Mucous membranes are moist.   Neck: No stridor.  No thyromegaly. Cardiovascular: Normal rate, regular rhythm. Grossly normal heart sounds.  Good peripheral circulation. Respiratory: Normal respiratory effort.  No retractions. Lungs CTAB. Gastrointestinal: Soft and nontender to light or deep palpation. No distention. No abdominal bruits. No CVA tenderness. Musculoskeletal: No lower extremity tenderness nor edema.  No joint effusions. Neurologic:  Normal speech and language. No gross focal neurologic deficits are appreciated. No gait instability. Skin:  Skin is warm, dry and intact. No rash noted. Psychiatric: Mood and affect are normal. Speech and behavior are normal.  ____________________________________________   LABS (all labs ordered are listed, but only abnormal results are displayed)  Labs Reviewed  BASIC METABOLIC PANEL - Abnormal; Notable for the following components:      Result Value   Glucose, Bld 138 (*)    All other components within normal limits  D-DIMER, QUANTITATIVE - Abnormal; Notable for the following components:   D-Dimer, Quant 1.21 (*)    All other components within normal limits  CBC  TSH  T4, FREE  TROPONIN I (HIGH SENSITIVITY)  TROPONIN I (HIGH SENSITIVITY)   ____________________________________________  EKG  ED ECG REPORT I, SUNG,JADE J, the attending physician, personally viewed and interpreted this ECG.   Date: 03/31/2021  EKG Time: 0203  Rate: 81  Rhythm: normal sinus rhythm  Axis: Normal  Intervals:right bundle branch block  ST&T Change:  Nonspecific  ____________________________________________  RADIOLOGY I, SUNG,JADE J, personally viewed and evaluated these images (plain radiographs) as part of my medical decision making, as well as reviewing the written report by the radiologist.  ED MD interpretation: No acute cardiopulmonary process; no PE  Official radiology report(s): DG Chest 2 View  Result Date: 03/31/2021 CLINICAL DATA:  Chest pain EXAM: CHEST - 2 VIEW COMPARISON:  11/21/2020 FINDINGS: Lungs are clear.  No pleural effusion or pneumothorax. The heart is normal in size.  Thoracic aortic atherosclerosis. Degenerative changes of the visualized thoracolumbar spine. Right shoulder arthroplasty. IMPRESSION: Normal chest radiographs. Electronically Signed   By: Julian Hy M.D.   On: 03/31/2021 02:23   CT Angio Chest PE W/Cm &/Or Wo Cm  Result Date: 03/31/2021 CLINICAL DATA:  Pain in the left chest that started 2 days ago EXAM: CT ANGIOGRAPHY CHEST WITH CONTRAST TECHNIQUE: Multidetector CT imaging of the chest was performed using the standard protocol during bolus administration of intravenous contrast. Multiplanar CT image reconstructions and MIPs were obtained to evaluate the vascular anatomy. CONTRAST:  29m OMNIPAQUE IOHEXOL 350 MG/ML SOLN COMPARISON:  09/06/2020 FINDINGS: Cardiovascular: Satisfactory opacification of the pulmonary arteries to the segmental level. No evidence of pulmonary embolism. Normal heart size. No pericardial effusion. Mediastinum/Nodes: Negative for adenopathy or mass. Lungs/Pleura: There is no edema, consolidation, effusion, or pneumothorax. Upper Abdomen: Negative Musculoskeletal: Spondylosis with multiple bridging osteophytes. No acute finding. Exaggerated thoracic kyphosis Review of the MIP images confirms the above findings. IMPRESSION: Negative for pulmonary embolism or other acute finding. Electronically Signed   By: JJorje GuildM.D.   On: 03/31/2021 06:25     ____________________________________________   PROCEDURES  Procedure(s) performed (including Critical Care):  .1-3 Lead EKG Interpretation Performed by: SPaulette Blanch MD Authorized by: SPaulette Blanch MD     Interpretation: normal     ECG rate:  75   ECG rate assessment: normal     Rhythm: sinus rhythm     Ectopy: none     Conduction: normal   Comments:     Patient placed on cardiac monitor to evaluate for arrhythmias   ____________________________________________   INITIAL IMPRESSION / ASSESSMENT AND PLAN / ED COURSE  As part of my medical decision making, I reviewed the following data within the [place] reviewed and incorporated, Labs reviewed, EKG interpreted, Old chart reviewed, Radiograph reviewed, and Notes from prior ED visits     60year old male presenting with chest pain. Differential diagnosis includes, but is not limited to, ACS, aortic dissection, pulmonary embolism, cardiac tamponade, pneumothorax, pneumonia, pericarditis, myocarditis, GI-related causes including esophagitis/gastritis, and musculoskeletal chest wall pain.     Initial troponin unremarkable.  Will obtain repeat troponin, thyroid panel for occasional fluttering, D-dimer as patient had recent shoulder surgery.  Currently patient voices no complaints of chest pain.  Clinical Course as of 03/31/21 0635  Wed Mar 31, 2021  05038Elevated D-dimer noted; will proceed with CTA chest to evaluate for PE. [JS]  0630 CTA negative for PE.  Patient voices no complaints of chest pain or palpitations.  Will discharge home with cardiology follow-up.  Strict return precautions given.  Patient verbalizes understanding and agrees with plan of care. [JS]    Clinical Course User Index [JS] SPaulette Blanch MD     ____________________________________________   FINAL CLINICAL IMPRESSION(S) / ED DIAGNOSES  Final diagnoses:  Nonspecific chest pain     ED Discharge Orders      None        Note:  This document was prepared using Dragon voice recognition software and may include unintentional dictation errors.    SPaulette Blanch MD 03/31/21 [phone]

## 2021-03-31 NOTE — Discharge Instructions (Signed)
Return to the ER for worsening symptoms, persistent vomiting, difficulty breathing or other concerns. °

## 2021-03-31 NOTE — ED Triage Notes (Signed)
Pain in the left chest, started about 2 days ago. Describes as a "tightness" Feels a "fluttering" sensation at night that comes and goes.

## 2021-04-01 ENCOUNTER — Inpatient Hospital Stay (HOSPITAL_BASED_OUTPATIENT_CLINIC_OR_DEPARTMENT_OTHER): Payer: Medicaid Other | Admitting: Internal Medicine

## 2021-04-01 DIAGNOSIS — R569 Unspecified convulsions: Secondary | ICD-10-CM | POA: Diagnosis not present

## 2021-04-01 DIAGNOSIS — C700 Malignant neoplasm of cerebral meninges: Secondary | ICD-10-CM

## 2021-04-01 MED ORDER — LACOSAMIDE 100 MG PO TABS: 100.0000 mg | ORAL_TABLET | Freq: Two times a day (BID) | ORAL | 3 refills | Status: DC

## 2021-04-01 NOTE — Progress Notes (Signed)
I connected with Joshua [name] on 04/01/21 at 10:00 AM EDT by telephone visit and verified that I am speaking with the correct person using two identifiers.  I discussed the limitations, risks, security and privacy concerns of performing an evaluation and management service by telemedicine and the availability of in-person appointments. I also discussed with the patient that there may be a patient responsible charge related to this service. The patient expressed understanding and agreed to proceed.  Other persons participating in the visit and their role in the encounter:  n/a Patient's location:  Home  Provider's location:  Office  Chief Complaint:  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[place])  Seizures ([place])  History of Present Ilness: Joshua [name] describes no new or progressive deficits.  No recent seizures, has been compliant with all medications.  He does describe ongoing issues with sleep, insomnia.  Continues with same left leg weakness as prior. Observations: Language and cognition at baseline  Imaging:  [place] Clinician Interpretation: I have personally reviewed the CNS images as listed.  My interpretation, in the context of the patient's clinical presentation, is stable disease  DG Chest 2 View  Result Date: 03/31/2021 CLINICAL DATA:  Chest pain EXAM: CHEST - 2 VIEW COMPARISON:  11/21/2020 FINDINGS: Lungs are clear.  No pleural effusion or pneumothorax. The heart is normal in size.  Thoracic aortic atherosclerosis. Degenerative changes of the visualized thoracolumbar spine. Right shoulder arthroplasty. IMPRESSION: Normal chest radiographs. Electronically Signed   By: Julian Hy M.D.   On: 03/31/2021 02:23   CT HEAD WO CONTRAST  Result Date: 03/19/2021 CLINICAL DATA:  Headache EXAM: CT HEAD WITHOUT CONTRAST TECHNIQUE: Contiguous axial images were obtained from the base of the skull through the vertex without intravenous contrast. COMPARISON:  CT brain 02/08/2021,  12/16/2020 09/21/2012 FINDINGS: Brain: No acute territorial infarction, hemorrhage, or intracranial mass. Encephalomalacia at the right greater than left cranial vertex, felt due to previous meningioma resection. Stable ventricle size. Mild chronic small vessel ischemic changes of the white matter Vascular: No hyperdense vessels.  No unexpected calcification Skull: Vertex craniotomy with underlying chronic dural thickening Sinuses/Orbits: No acute finding. Other: None IMPRESSION: 1. No CT evidence for acute intracranial abnormality. 2. Postsurgical changes at the cranial vertex from prior meningioma resection with adjacent encephalomalacia. 3. Chronic small vessel ischemic changes of the white matter Electronically Signed   By: Donavan Foil M.D.   On: 03/19/2021 16:24   CT Angio Chest PE W/Cm &/Or Wo Cm  Result Date: 03/31/2021 CLINICAL DATA:  Pain in the left chest that started 2 days ago EXAM: CT ANGIOGRAPHY CHEST WITH CONTRAST TECHNIQUE: Multidetector CT imaging of the chest was performed using the standard protocol during bolus administration of intravenous contrast. Multiplanar CT image reconstructions and MIPs were obtained to evaluate the vascular anatomy. CONTRAST:  73m OMNIPAQUE IOHEXOL 350 MG/ML SOLN COMPARISON:  09/06/2020 FINDINGS: Cardiovascular: Satisfactory opacification of the pulmonary arteries to the segmental level. No evidence of pulmonary embolism. Normal heart size. No pericardial effusion. Mediastinum/Nodes: Negative for adenopathy or mass. Lungs/Pleura: There is no edema, consolidation, effusion, or pneumothorax. Upper Abdomen: Negative Musculoskeletal: Spondylosis with multiple bridging osteophytes. No acute finding. Exaggerated thoracic kyphosis Review of the MIP images confirms the above findings. IMPRESSION: Negative for pulmonary embolism or other acute finding. Electronically Signed   By: JJorje GuildM.D.   On: 03/31/2021 06:25   MR BRAIN W WO CONTRAST  Result Date:  03/27/2021 CLINICAL DATA:  Brain/CNS neoplasm, assess treatment response EXAM: MRI HEAD  WITHOUT AND WITH CONTRAST TECHNIQUE: Multiplanar, multiecho pulse sequences of the brain and surrounding structures were obtained without and with intravenous contrast. CONTRAST:  35m GADAVIST GADOBUTROL 1 MMOL/ML IV SOLN COMPARISON:  MRI September 24, 2020. FINDINGS: Brain: No substantial change in postsurgical changes in the right frontoparietal and left parietal regions. Similar dural thickening and heterogeneous nodular enhancement in this region. No clearly new areas of enhancement. 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 Adjacent T2 hyperintensity in the right frontoparietal and left parietal regions is similar. Additional confluent periventricular T2 hyperintensity is also similar. No acute infarct, acute hemorrhage, midline shift, extra-axial fluid collection, or hydrocephalus. Vascular: Major arterial flow voids are maintained at the skull base. Skull and upper cervical spine: Postsurgical changes from biparietal craniotomy. Otherwise, normal marrow signal. Sinuses/Orbits: Clear sinuses.  Unremarkable orbits. Other: No mastoid effusions. IMPRESSION: Similar appearance of the parafalcine meningioma treatment site without evidence of progression. Similar chronically occluded superior sagittal sinus. Electronically Signed   By: FMargaretha SheffieldM.D.   On: 03/27/2021 12:12    Assessment and Plan: 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)  Seizures ([place]  Clinically and radiographically stable.   Because of prolonged seizure freedom, ok with trial of lower dose of vimpat, '100mg'$  BID.  Follow Up Instructions:  We will touch base with him in 2 months via phone to assess AED titration, consider additional lowering.    Next MRI brain should be in 9 months.  I discussed the assessment and treatment plan with the patient.  The patient was provided an opportunity to ask questions  and all were answered.  The patient agreed with the plan and demonstrated understanding of the instructions.    The patient was advised to call back or seek an in-person evaluation if the symptoms worsen or if the condition fails to improve as anticipated.  I provided 5-10 minutes of non-face-to-face time during this enocunter.  ZVentura Sellers MD   I provided 22 minutes of non face-to-face telephone visit time during this encounter, and > 50% was spent counseling as documented under my assessment & plan.

## 2021-04-01 NOTE — Telephone Encounter (Signed)
Patient was asked if he informed eye specialist about limited change. He states he did and was told he had to wait until Oct. For another appt. They are waiting for insurance to clear.  Was under impression that PCP can override this.   Objective is to get eyes checked.  Blurred vision, watery eyes, and foggy.  Readers are not helping as much. States he was not sure if this was r/t previous brain surgery.

## 2021-04-01 NOTE — Telephone Encounter (Signed)
I am not able to override. If he wants another Ophthalmology referral I can place one but there is no guarantee of a sooner appointment.

## 2021-04-02 ENCOUNTER — Telehealth: Payer: Self-pay | Admitting: Internal Medicine

## 2021-04-02 NOTE — Telephone Encounter (Signed)
Patient verbalizes understanding.

## 2021-04-02 NOTE — Telephone Encounter (Signed)
Scheduled appt per 9/15 los. Pt aware.  

## 2021-04-07 ENCOUNTER — Ambulatory Visit: Payer: Medicaid Other | Admitting: Physical Therapy

## 2021-04-12 ENCOUNTER — Other Ambulatory Visit: Payer: Self-pay | Admitting: Internal Medicine

## 2021-04-12 DIAGNOSIS — C700 Malignant neoplasm of cerebral meninges: Secondary | ICD-10-CM

## 2021-04-12 DIAGNOSIS — R569 Unspecified convulsions: Secondary | ICD-10-CM

## 2021-04-13 ENCOUNTER — Telehealth: Payer: Self-pay | Admitting: Family Medicine

## 2021-04-13 NOTE — Telephone Encounter (Signed)
Copied from [place] [phone]. Topic: General - Inquiry >> Apr 12, 2021  9:35 AM Robina Ade, Helene Kelp D wrote: Reason for CRM: Patient called and would like to speak with Dr. Margarita Rana or her CMA about requesting a note to be faxed over to his optometrist requesting his rx for his eyes because the is the only way he can get it and get new glasses. The fax number is [phone] but patient is not sure of the office name.

## 2021-04-14 ENCOUNTER — Ambulatory Visit: Payer: Medicaid Other | Admitting: Physical Therapy

## 2021-04-14 NOTE — Telephone Encounter (Signed)
Pt was called and informed that he has to sign a medical release form to get his records faxed to [place] from his eye doctor.

## 2021-04-14 NOTE — Telephone Encounter (Signed)
Pt was following up on previous message, pt wanted a call back on the status of his request.

## 2021-04-19 ENCOUNTER — Ambulatory Visit: Payer: Medicaid Other | Admitting: Orthopedic Surgery

## 2021-04-19 ENCOUNTER — Other Ambulatory Visit: Payer: Self-pay

## 2021-04-21 ENCOUNTER — Ambulatory Visit: Payer: Medicaid Other | Admitting: Physical Therapy

## 2021-04-27 ENCOUNTER — Other Ambulatory Visit: Payer: Self-pay | Admitting: *Deleted

## 2021-04-27 DIAGNOSIS — C700 Malignant neoplasm of cerebral meninges: Secondary | ICD-10-CM

## 2021-04-27 NOTE — Progress Notes (Signed)
Patient called requesting referral for exam eye.  He states that his insurance is stating that a referral must be placed with documentation to support need for exam.  Unsure if frequency of exam is due at this time or not.  Patient states he has been having headaches and vision changes that Dr Mickeal Skinner has told him that are not related to his meningioma.  Referral placed and faxed to [facility].

## 2021-04-27 NOTE — Telephone Encounter (Signed)
Pt states he is going to call his neurologist and request a letter be sent to Dr Margarita Rana explaining the reason it is important he get eye glasses.  Pt states post tumor, he is left with blurred vision.

## 2021-04-28 ENCOUNTER — Telehealth: Payer: Self-pay | Admitting: *Deleted

## 2021-04-28 ENCOUNTER — Ambulatory Visit: Payer: Medicaid Other | Admitting: Physical Therapy

## 2021-04-28 ENCOUNTER — Other Ambulatory Visit: Payer: Self-pay | Admitting: Internal Medicine

## 2021-04-28 DIAGNOSIS — C700 Malignant neoplasm of cerebral meninges: Secondary | ICD-10-CM

## 2021-04-28 DIAGNOSIS — Z23 Encounter for immunization: Secondary | ICD-10-CM | POA: Diagnosis not present

## 2021-04-28 NOTE — Telephone Encounter (Signed)
Noted  

## 2021-04-28 NOTE — Telephone Encounter (Signed)
Copied from [place] [phone]. Topic: General - Other >> Apr 23, 2021  2:49 PM Valere Dross wrote: Reason for CRM: Pt called in stating he got a call from [facility] doc in [place], and he missed it and they told him they wouldn't be able to see him, and advise him he could go to one in [place], and he would like PCP to recommend him to a place, as well as a referral for a sleep study. Please advise.

## 2021-04-28 NOTE — Telephone Encounter (Signed)
Copied from [place] [phone]. Topic: General - Call Back - No Documentation >> Apr 27, 2021 10:33 AM Erick Blinks wrote: Reason for CRM: Pt wants to speak to the clinic, he is due for a an exam and a pair of glasses. He has spoken with his insurance and was told that he was eligible to receive these free services however when he goes to an eye doctor they deny him. Currently has an appt this Monday with [facility].   Best contact: [phone]

## 2021-04-29 NOTE — Telephone Encounter (Signed)
Pt has upcoming appointment with PCP all matter will be discussed at visit.

## 2021-05-05 ENCOUNTER — Ambulatory Visit: Payer: Medicaid Other | Admitting: Physical Therapy

## 2021-05-12 ENCOUNTER — Encounter: Payer: Self-pay | Admitting: Family Medicine

## 2021-05-12 ENCOUNTER — Ambulatory Visit: Payer: Medicaid Other | Admitting: Physical Therapy

## 2021-05-12 ENCOUNTER — Ambulatory Visit: Payer: Medicaid Other | Attending: Family Medicine | Admitting: Family Medicine

## 2021-05-12 ENCOUNTER — Other Ambulatory Visit: Payer: Self-pay

## 2021-05-12 VITALS — BP 130/77 | HR 73 | Ht 68.0 in | Wt 198.0 lb

## 2021-05-12 DIAGNOSIS — I10 Essential (primary) hypertension: Secondary | ICD-10-CM | POA: Diagnosis not present

## 2021-05-12 DIAGNOSIS — R29818 Other symptoms and signs involving the nervous system: Secondary | ICD-10-CM | POA: Diagnosis not present

## 2021-05-12 DIAGNOSIS — R21 Rash and other nonspecific skin eruption: Secondary | ICD-10-CM

## 2021-05-12 DIAGNOSIS — R7303 Prediabetes: Secondary | ICD-10-CM | POA: Diagnosis not present

## 2021-05-12 LAB — POCT GLYCOSYLATED HEMOGLOBIN (HGB A1C): HbA1c, POC (controlled diabetic range): 6.1 % (ref 0.0–7.0)

## 2021-05-12 NOTE — Progress Notes (Signed)
Subjective:  Patient ID: Joshua [name], male    DOB: 1960-11-06  Age: 60 y.o. MRN: [id number]  CC: Hypertension   HPI Joshua [name] is a 60 y.o. year old male with a history of meningioma status post resection in 2014 hospitalized at [facility] from 01/03/2019 through 01/14/2019 for recurrent meningioma status post resection (the last of which was in 12/2018), seizures, Prediabetes, Hypertension.  Interval History: Wakes up with his mouth dry. Complains of snoring at night and waking up to catch his breath. He admits to daytime somnolence but is also on Klonopin which also makes him sedated in the mornings.  He would like to be evaluated for sleep apnea.  He has some spots on his body which he would like looked at as he is unsure if he was bitten by an insect. Doing well on his metformin for prediabetes and he is compliant with his amlodipine for hypertension. Past Medical History:  Diagnosis Date   Anxiety    Brain tumor ([place]) 02/20/2013   brain tumor removed in March 2014, Dr Donald Pore   Diffuse idiopathic skeletal hyperostosis 06/05/2013   Dizziness    Enlarged prostate    GERD (gastroesophageal reflux disease)    Headache(784.0)    scattered   High cholesterol    Hypertension    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[place]) 03/07/2019   Meningioma ([place])    Meningioma, recurrent of brain ([place]) 01/31/2019   Neuropathy 12/05/2019   Pre-diabetes    Rotator cuff tear    right   Seizures ([place])    11/27/19 last sz 1 wk ago    Past Surgical History:  Procedure Laterality Date   APPLICATION OF CRANIAL NAVIGATION N/A 01/10/2019   Procedure: APPLICATION OF CRANIAL NAVIGATION;  Surgeon: Erline Levine, MD;  Location: [place];  Service: Neurosurgery;  Laterality: N/A;   CATARACT EXTRACTION Right    CRANIOTOMY Right 11/06/2012   Procedure: CRANIOTOMY TUMOR EXCISION;  Surgeon: Erline Levine, MD;  Location: [place] [facility];  Service: Neurosurgery;  Laterality: Right;  Right  Parasagittal craniotomy for meningioma with Stealth   CRANIOTOMY Right 01/10/2019   Procedure: Right Parasagittal Craniotomy for Tumor;  Surgeon: Erline Levine, MD;  Location: [place];  Service: Neurosurgery;  Laterality: Right;  Right parasagittal craniotomy for tumor   ESOPHAGOGASTRODUODENOSCOPY     JOINT REPLACEMENT Right 2018   right hip   REVERSE SHOULDER ARTHROPLASTY Right 01/08/2021   Procedure: RIGHT REVERSE SHOULDER ARTHROPLASTY;  Surgeon: Meredith Pel, MD;  Location: [place];  Service: Orthopedics;  Laterality: Right;   right knee arthroscopy     SHOULDER ARTHROSCOPY Right 06/09/2020   Procedure: RIGHT SHOULDER ARTHROSCOPY AND DEBRIDEMENT;  Surgeon: Newt Minion, MD;  Location: [place];  Service: Orthopedics;  Laterality: Right;    Family History  Problem Relation Age of Onset   Hypertension Mother    Dementia Mother    Diabetes Mother    Hypertension Father    CAD Father    Diabetes Father    CAD Brother     No Known Allergies  Outpatient Medications Prior to Visit  Medication Sig Dispense Refill   Accu-Chek Softclix Lancets lancets Use as instructed 100 each 12   acetaminophen (TYLENOL) 500 MG tablet Take 500 mg by mouth every 6 (six) hours as needed for moderate pain or headache.     albuterol (VENTOLIN HFA) 108 (90 Base) MCG/ACT inhaler Inhale 2 puffs into the lungs every 6 (six) hours as  needed for wheezing or shortness of breath. 8 g 2   amLODipine (NORVASC) 5 MG tablet TAKE 1 TABLET (5 MG TOTAL) BY MOUTH DAILY. TO LOWER BLOOD PRESSURE. STOP LOSARTAN 90 tablet 1   aspirin 81 MG chewable tablet Chew 81 mg by mouth daily.     atorvastatin (LIPITOR) 20 MG tablet Take 1 tablet (20 mg total) by mouth daily. 90 tablet 1   bisacodyl (DULCOLAX) 5 MG EC tablet Take 5 mg by mouth daily as needed for moderate constipation.     Blood Glucose Monitoring Suppl (ACCU-CHEK GUIDE ME) w/Device KIT Use to check blood sugar once daily. 1 kit 0    butalbital-acetaminophen-caffeine (FIORICET) 50-325-40 MG tablet Take 1-2 tablets by mouth every 6 (six) hours as needed for headache. 20 tablet 0   clonazePAM (KLONOPIN) 0.5 MG tablet TAKE 1 TABLET BY MOUTH 2 TIMES DAILY. 60 tablet 1   docusate sodium (COLACE) 100 MG capsule Take 1 capsule (100 mg total) by mouth 2 (two) times daily. 10 capsule 0   glucose blood (ACCU-CHEK GUIDE) test strip Use to check blood sugar once daily. 100 each 6   Lacosamide (VIMPAT) 100 MG TABS Take 1 tablet (100 mg total) by mouth in the morning and at bedtime. 60 tablet 3   lamoTRIgine (LAMICTAL) 100 MG tablet TAKE 2 TABLETS BY MOUTH TWICE A DAY 120 tablet 0   metFORMIN (GLUCOPHAGE) 500 MG tablet Take 1 tablet (500 mg total) by mouth 2 (two) times daily. 180 tablet 1   methocarbamol (ROBAXIN) 500 MG tablet Take 1 tablet (500 mg total) by mouth every 8 (eight) hours as needed for muscle spasms. 30 tablet 0   Misc. Devices MISC Please provide BP cuff for patient 1 each 0   oxyCODONE (OXY IR/ROXICODONE) 5 MG immediate release tablet Take 1 tablet (5 mg total) by mouth every 8 (eight) hours. 20 tablet 0   pantoprazole (PROTONIX) 20 MG tablet Take 1 tablet (20 mg total) by mouth 2 (two) times daily before a meal. 60 tablet 3   tamsulosin (FLOMAX) 0.4 MG CAPS capsule Take 1 capsule (0.4 mg total) by mouth daily. 90 capsule 3   VITAMIN A PO Take 1 capsule by mouth daily.     No facility-administered medications prior to visit.     ROS Review of Systems  Constitutional:  Negative for activity change and appetite change.  HENT:  Negative for sinus pressure and sore throat.   Eyes:  Negative for visual disturbance.  Respiratory:  Negative for cough, chest tightness and shortness of breath.   Cardiovascular:  Negative for chest pain and leg swelling.  Gastrointestinal:  Negative for abdominal distention, abdominal pain, constipation and diarrhea.  Endocrine: Negative.   Genitourinary:  Negative for dysuria.   Musculoskeletal:  Negative for joint swelling and myalgias.  Skin:  Positive for rash.  Allergic/Immunologic: Negative.   Neurological:  Negative for weakness, light-headedness and numbness.  Psychiatric/Behavioral:  Negative for dysphoric mood and suicidal ideas.    Objective:  BP 130/77   Pulse 73   Ht 5' 8"  (1.727 m)   Wt 198 lb (89.8 kg)   SpO2 98%   BMI 30.11 kg/m   BP/Weight 05/12/2021 0/35/0093 02/15/8298  Systolic BP 371 696 789  Diastolic BP 77 70 79  Wt. (Lbs) 198 175 180  BMI 30.11 26.61 27.37  Some encounter information is confidential and restricted. Go to Review Flowsheets activity to see all data.      Physical Exam Constitutional:  Appearance: He is well-developed.  Cardiovascular:     Rate and Rhythm: Normal rate.     Heart sounds: Normal heart sounds. No murmur heard. Pulmonary:     Effort: Pulmonary effort is normal.     Breath sounds: Normal breath sounds. No wheezing or rales.  Chest:     Chest wall: No tenderness.  Abdominal:     General: Bowel sounds are normal. There is no distension.     Palpations: Abdomen is soft. There is no mass.     Tenderness: There is no abdominal tenderness.  Musculoskeletal:        General: Normal range of motion.     Right lower leg: No edema.     Left lower leg: No edema.  Skin:    Comments: Scar on left lateral posterior thorax which is hyperpigmented and minute  Neurological:     Mental Status: He is alert and oriented to person, place, and time.  Psychiatric:        Mood and Affect: Mood normal.    CMP Latest Ref Rng & Units 03/31/2021 03/19/2021 01/05/2021  Glucose 70 - 99 mg/dL 138(H) 115(H) 115(H)  BUN 6 - 20 mg/dL 13 13 14   Creatinine 0.61 - 1.24 mg/dL 0.78 0.89 0.84  Sodium 135 - 145 mmol/L 141 140 140  Potassium 3.5 - 5.1 mmol/L 3.8 4.0 4.1  Chloride 98 - 111 mmol/L 108 105 106  CO2 22 - 32 mmol/L 25 28 25   Calcium 8.9 - 10.3 mg/dL 9.2 9.5 9.5  Total Protein 6.5 - 8.1 g/dL - - -  Total  Bilirubin 0.3 - 1.2 mg/dL - - -  Alkaline Phos 38 - 126 U/L - - -  AST 15 - 41 U/L - - -  ALT 0 - 44 U/L - - -    Lipid Panel  No results found for: CHOL, TRIG, HDL, CHOLHDL, VLDL, LDLCALC, LDLDIRECT  CBC    Component Value Date/Time   WBC 6.4 03/31/2021 0205   RBC 4.81 03/31/2021 0205   HGB 15.1 03/31/2021 0205   HCT 42.9 03/31/2021 0205   PLT 250 03/31/2021 0205   MCV 89.2 03/31/2021 0205   MCH 31.4 03/31/2021 0205   MCHC 35.2 03/31/2021 0205   RDW 13.1 03/31/2021 0205   LYMPHSABS 1.8 10/16/2020 2041   MONOABS 1.2 (H) 10/16/2020 2041   EOSABS 0.1 10/16/2020 2041   BASOSABS 0.1 10/16/2020 2041    Lab Results  Component Value Date   HGBA1C 6.3 (H) 01/08/2021    Assessment & Plan:  1. Prediabetes Trolled with A1c of 6.3 Continue metformin Continue lifestyle modification to prevent progression to type 2 diabetes mellitus - POCT glycosylated hemoglobin (Hb A1C)  2. Suspected sleep apnea - Split night study; Future  3. Rash and nonspecific skin eruption He does have a scar on the left side of his posterior thorax which could be from an arthropod bite.  Lesion is not suspicious looking.  4. Essential hypertension Controlled Continue [place].   No orders of the defined types were placed in this encounter.   Return in about 3 months (around 08/12/2021) for medical conditions.      Charlott Rakes, MD, FAAFP. [facility] [place], [place]   05/12/2021, 4:23 PM

## 2021-05-12 NOTE — Progress Notes (Signed)
Wants to get sleep study done.  Has spots on different parts of body.

## 2021-05-20 ENCOUNTER — Ambulatory Visit (INDEPENDENT_AMBULATORY_CARE_PROVIDER_SITE_OTHER): Payer: Medicaid Other | Admitting: Physician Assistant

## 2021-05-20 ENCOUNTER — Other Ambulatory Visit: Payer: Self-pay

## 2021-05-20 ENCOUNTER — Encounter: Payer: Self-pay | Admitting: Physician Assistant

## 2021-05-20 VITALS — BP 146/89 | HR 74 | Ht 69.0 in | Wt 180.0 lb

## 2021-05-20 DIAGNOSIS — N400 Enlarged prostate without lower urinary tract symptoms: Secondary | ICD-10-CM | POA: Diagnosis not present

## 2021-05-20 DIAGNOSIS — R300 Dysuria: Secondary | ICD-10-CM

## 2021-05-20 LAB — BLADDER SCAN AMB NON-IMAGING: Scan Result: 2

## 2021-05-20 NOTE — Progress Notes (Addendum)
05/20/2021 1:51 PM   Joshua [name] 1960/12/19 [id number]  CC: Chief Complaint  Patient presents with   Benign Prostatic Hypertrophy   HPI: Joshua [name] is a 60 y.o. male with PMH BPH with LUTS on Flomax who presents today for evaluation of possible UTI.   Today he reports an approximate 1 month history of intermittent penile stinging after urination.  He denies genital swelling, discharge, or worsening voiding symptoms.  In-office UA today positive for 1+ protein; urine microscopy pan negative. PVR 35m.  PMH: Past Medical History:  Diagnosis Date   Anxiety    [place]   brain tumor removed in March 2014, Dr SDonald Pore  Diffuse idiopathic skeletal hyperostosis 06/05/2013   Dizziness    Enlarged prostate    GERD (gastroesophageal reflux disease)    Headache(784.0)    scattered   High cholesterol    Hypertension    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[place] 03/07[place]   [place]    [place] 01/[place]   Neuropathy 12/05/2019   Pre-diabetes    Rotator cuff tear    right   [place] ([place]    11/27/19 last sz 1 wk ago    Surgical History: Past Surgical History:  Procedure Laterality Date   APPLICATION OF CRANIAL NAVIGATION N/A 01/10/2019   Procedure: APPLICATION OF CRANIAL NAVIGATION;  Surgeon: SErline Levine MD;  [place]  Service: Neurosurgery;  Laterality: N/A;   CATARACT EXTRACTION Right    CRANIOTOMY Right 11/06/2012   Procedure: CRANIOTOMY TUMOR EXCISION;  Surgeon: JErline Levine MD;  [facility];  Service: Neurosurgery;  Laterality: Right;  Right Parasagittal craniotomy for meningioma with Stealth   CRANIOTOMY Right 01/10/2019   Procedure: Right Parasagittal Craniotomy for Tumor;  Surgeon: SErline Levine MD;  [place]  Service: Neurosurgery;  Laterality: Right;  Right parasagittal craniotomy for tumor   ESOPHAGOGASTRODUODENOSCOPY     JOINT REPLACEMENT Right 2018   right  hip   REVERSE SHOULDER ARTHROPLASTY Right 01/08/2021   Procedure: RIGHT REVERSE SHOULDER ARTHROPLASTY;  Surgeon: DMeredith Pel MD;  [place]  Service: Orthopedics;  Laterality: Right;   right knee arthroscopy     SHOULDER ARTHROSCOPY Right 06/09/2020   Procedure: RIGHT SHOULDER ARTHROSCOPY AND DEBRIDEMENT;  Surgeon: DNewt Minion MD;  [place]  Service: Orthopedics;  Laterality: Right;    Home Medications:  Allergies as of 05/20/2021   No Known Allergies      Medication List        Accurate as of May 20, 2021  1:51 PM. If you have any questions, ask your nurse or doctor.          STOP taking these medications    bisacodyl 5 MG EC tablet Commonly known as: DULCOLAX Stopped by: SDebroah Loop PA-C   methocarbamol 500 MG tablet Commonly known as: ROBAXIN Stopped by: SDebroah Loop PA-C   oxyCODONE 5 MG immediate release tablet Commonly known as: Oxy IR/ROXICODONE Stopped by: SDebroah Loop PA-C       TAKE these medications    Accu-Chek Guide Me w/Device Kit Use to check blood sugar once daily.   Accu-Chek Guide test strip Generic drug: glucose blood Use to check blood sugar once daily.   Accu-Chek Softclix Lancets lancets Use as instructed   acetaminophen 500 MG tablet Commonly known as: TYLENOL Take 500 mg by mouth every 6 (six) hours as needed for moderate pain or headache.  albuterol 108 (90 Base) MCG/ACT inhaler Commonly known as: VENTOLIN HFA Inhale 2 puffs into the lungs every 6 (six) hours as needed for wheezing or shortness of breath.   amLODipine 5 MG tablet Commonly known as: NORVASC TAKE 1 TABLET (5 MG TOTAL) BY MOUTH DAILY. TO LOWER BLOOD PRESSURE. STOP LOSARTAN   aspirin 81 MG chewable tablet Chew 81 mg by mouth daily.   atorvastatin 20 MG tablet Commonly known as: LIPITOR Take 1 tablet (20 mg total) by mouth daily.   butalbital-acetaminophen-caffeine 50-325-40 MG  tablet Commonly known as: FIORICET Take 1-2 tablets by mouth every 6 (six) hours as needed for headache.   clonazePAM 0.5 MG tablet Commonly known as: KLONOPIN TAKE 1 TABLET BY MOUTH 2 TIMES DAILY.   docusate sodium 100 MG capsule Commonly known as: COLACE Take 1 capsule (100 mg total) by mouth 2 (two) times daily.   Lacosamide 100 MG Tabs Commonly known as: Vimpat Take 1 tablet (100 mg total) by mouth in the morning and at bedtime.   lamoTRIgine 100 MG tablet Commonly known as: LAMICTAL TAKE 2 TABLETS BY MOUTH TWICE A DAY   metFORMIN 500 MG tablet Commonly known as: GLUCOPHAGE Take 1 tablet (500 mg total) by mouth 2 (two) times daily.   Misc. Devices Misc Please provide BP cuff for patient   pantoprazole 20 MG tablet Commonly known as: Protonix Take 1 tablet (20 mg total) by mouth 2 (two) times daily before a meal.   tamsulosin 0.4 MG Caps capsule Commonly known as: FLOMAX Take 1 capsule (0.4 mg total) by mouth daily.   VITAMIN A PO Take 1 capsule by mouth daily.        Allergies:  No Known Allergies  Family History: Family History  Problem Relation Age of Onset   Hypertension Mother    Dementia Mother    Diabetes Mother    Hypertension Father    CAD Father    Diabetes Father    CAD Brother     Social History:   reports that he quit smoking about 2 years ago. His smoking use included cigarettes. He has never used smokeless tobacco. He reports that he does not currently use alcohol. He reports that he does not use drugs.  Physical Exam: BP (!) 146/89   Pulse 74   Ht 5' 9"  (1.753 m)   Wt 180 lb (81.6 kg)   BMI 26.58 kg/m   Constitutional:  Alert and oriented, no acute distress, nontoxic appearing HEENT: Joshua [name], AT Cardiovascular: No clubbing, cyanosis, or edema Respiratory: Normal respiratory effort, no increased work of breathing Skin: No rashes, bruises or suspicious lesions Neurologic: Grossly intact, no focal deficits, moving all 4  extremities Psychiatric: Normal mood and affect  Laboratory Data: Results for orders placed or performed in visit on 05/20/21  Microscopic Examination   Urine  Result Value Ref Range   WBC, UA 6-10 (A) 0 - 5 /hpf   RBC >30 (A) 0 - 2 /hpf   Epithelial Cells (non renal) 0-10 0 - 10 /hpf   Bacteria, UA Few (A) None seen/Few  Urinalysis, Complete  Result Value Ref Range   Specific Gravity, UA 1.015 1.005 - 1.030   pH, UA 7.0 5.0 - 7.5   Color, UA Yellow Yellow   Appearance Ur Cloudy (A) Clear   Leukocytes,UA 1+ (A) Negative   Protein,UA Negative Negative/Trace   Glucose, UA Negative Negative   Ketones, UA Negative Negative   RBC, UA 3+ (A) Negative   Bilirubin, UA  Negative Negative   Urobilinogen, Ur 0.2 0.2 - 1.0 mg/dL   Nitrite, UA Negative Negative   Microscopic Examination See below:   BLADDER SCAN AMB NON-IMAGING  Result Value Ref Range   Scan Result 2 ml    Assessment & Plan:   1. Dysuria Intermittent, subacute.  Low concern for prostatitis in the absence of worsening voiding symptoms.  Low concern for UTI given bland UA today.  He is emptying appropriately.  Will send for GC testing to rule out STI.  Patient was reassured that his symptoms do not indicate cystitis. - Urinalysis, Complete - BLADDER SCAN AMB NON-IMAGING - Ct Ng M genitalium NAA, Urine  Return if symptoms worsen or fail to improve.  Debroah Loop, PA-C  [facility] [address], [place],  [address] [phone]

## 2021-05-21 ENCOUNTER — Telehealth: Payer: Self-pay | Admitting: Family Medicine

## 2021-05-21 ENCOUNTER — Other Ambulatory Visit: Payer: Self-pay | Admitting: Internal Medicine

## 2021-05-21 ENCOUNTER — Telehealth (INDEPENDENT_AMBULATORY_CARE_PROVIDER_SITE_OTHER): Payer: Self-pay

## 2021-05-21 DIAGNOSIS — C700 Malignant neoplasm of cerebral meninges: Secondary | ICD-10-CM

## 2021-05-21 DIAGNOSIS — R569 Unspecified convulsions: Secondary | ICD-10-CM

## 2021-05-21 LAB — MICROSCOPIC EXAMINATION: RBC, Urine: NONE SEEN /hpf (ref 0–2)

## 2021-05-21 LAB — URINALYSIS, COMPLETE
Bilirubin, UA: NEGATIVE
Glucose, UA: NEGATIVE
Ketones, UA: NEGATIVE
Leukocytes,UA: NEGATIVE
Nitrite, UA: NEGATIVE
RBC, UA: NEGATIVE
Specific Gravity, UA: >1.03 — ABNORMAL HIGH (ref 1.005–1.030)
Urobilinogen, Ur: 0.2 mg/dL (ref 0.2–1.0)
pH, UA: 5 (ref 5.0–7.5)

## 2021-05-21 NOTE — Telephone Encounter (Signed)
Patient calingback checking status of referral for sleep study he want t o have in [place]. Please call back

## 2021-05-21 NOTE — Telephone Encounter (Signed)
Copied from [place] [phone]. Topic: Referral - Request for Referral >> May 18, 2021 10:51 AM Tessa Lerner A wrote: Has patient seen PCP for this complaint? Yes.   *If NO, is insurance requiring patient see PCP for this issue before PCP can refer them? Referral for which specialty: Sleep Specialist  Preferred provider/office: The patient would like to be seen at a sleep center in [place]  Reason for referral: The patient would like to have a sleep study

## 2021-05-22 ENCOUNTER — Emergency Department: Payer: Medicaid Other

## 2021-05-22 ENCOUNTER — Other Ambulatory Visit: Payer: Self-pay

## 2021-05-22 ENCOUNTER — Emergency Department
Admission: EM | Admit: 2021-05-22 | Discharge: 2021-05-22 | Disposition: A | Discharge status: Home / Self Care | Payer: Medicaid Other | Attending: Emergency Medicine | Admitting: Emergency Medicine

## 2021-05-22 DIAGNOSIS — I10 Essential (primary) hypertension: Secondary | ICD-10-CM | POA: Insufficient documentation

## 2021-05-22 DIAGNOSIS — R509 Fever, unspecified: Secondary | ICD-10-CM | POA: Diagnosis not present

## 2021-05-22 DIAGNOSIS — Z79899 Other long term (current) drug therapy: Secondary | ICD-10-CM | POA: Insufficient documentation

## 2021-05-22 DIAGNOSIS — E1140 Type 2 diabetes mellitus with diabetic neuropathy, unspecified: Secondary | ICD-10-CM | POA: Diagnosis not present

## 2021-05-22 DIAGNOSIS — R110 Nausea: Secondary | ICD-10-CM | POA: Insufficient documentation

## 2021-05-22 DIAGNOSIS — R519 Headache, unspecified: Secondary | ICD-10-CM | POA: Diagnosis not present

## 2021-05-22 DIAGNOSIS — Z85841 Personal history of malignant neoplasm of brain: Secondary | ICD-10-CM | POA: Diagnosis not present

## 2021-05-22 DIAGNOSIS — Z7982 Long term (current) use of aspirin: Secondary | ICD-10-CM | POA: Diagnosis not present

## 2021-05-22 DIAGNOSIS — Z20822 Contact with and (suspected) exposure to covid-19: Secondary | ICD-10-CM | POA: Diagnosis not present

## 2021-05-22 DIAGNOSIS — R6883 Chills (without fever): Secondary | ICD-10-CM

## 2021-05-22 DIAGNOSIS — R109 Unspecified abdominal pain: Secondary | ICD-10-CM | POA: Diagnosis not present

## 2021-05-22 DIAGNOSIS — Z96611 Presence of right artificial shoulder joint: Secondary | ICD-10-CM | POA: Insufficient documentation

## 2021-05-22 DIAGNOSIS — Z7984 Long term (current) use of oral hypoglycemic drugs: Secondary | ICD-10-CM | POA: Insufficient documentation

## 2021-05-22 DIAGNOSIS — G8929 Other chronic pain: Secondary | ICD-10-CM | POA: Diagnosis not present

## 2021-05-22 DIAGNOSIS — Z87891 Personal history of nicotine dependence: Secondary | ICD-10-CM | POA: Insufficient documentation

## 2021-05-22 DIAGNOSIS — Z96641 Presence of right artificial hip joint: Secondary | ICD-10-CM | POA: Insufficient documentation

## 2021-05-22 LAB — URINALYSIS, COMPLETE (UACMP) WITH MICROSCOPIC
Bacteria, UA: NONE SEEN
Bilirubin Urine: NEGATIVE
Glucose, UA: NEGATIVE mg/dL
Hgb urine dipstick: NEGATIVE
Ketones, ur: NEGATIVE mg/dL
Leukocytes,Ua: NEGATIVE
Nitrite: NEGATIVE
Protein, ur: NEGATIVE mg/dL
Specific Gravity, Urine: 1.023 (ref 1.005–1.030)
Squamous Epithelial / HPF: NONE SEEN (ref 0–5)
pH: 5 (ref 5.0–8.0)

## 2021-05-22 LAB — RESP PANEL BY RT-PCR (FLU A&B, COVID) ARPGX2
Influenza A by PCR: NEGATIVE
Influenza B by PCR: NEGATIVE
SARS Coronavirus 2 by RT PCR: NEGATIVE

## 2021-05-22 MED ORDER — ACETAMINOPHEN 500 MG PO TABS
1000.0000 mg | ORAL_TABLET | Freq: Once | ORAL | Status: AC
  Administered 2021-05-22: 1000 mg via ORAL
  Filled 2021-05-22: qty 2

## 2021-05-22 NOTE — ED Triage Notes (Signed)
Pt presents to ER via pov c/o fever, and chills.  Pt states he came in tonight because he had episodes of being very hot and then cold.  Pt denies vomiting and diarrhea but states he has been nauseous.  Pt states he has not been around anyone sick.  No resp distress noted.

## 2021-05-22 NOTE — ED Notes (Signed)
See triage note  presents with some nausea  also has had some episodes of being hot and cold  afebrile on arrival

## 2021-05-22 NOTE — ED Provider Notes (Signed)
[facility] Emergency Department Provider Note  ____________________________________________   None    (approximate)  I have reviewed the triage vital signs and the nursing notes.   HISTORY  Chief Complaint Fever   HPI Joshua [name] is a 60 y.o. male with past medical history of meningioma s/p resection, HTN, HDL, peripheral neuropathy with chronic decreased sensation specifically in the left lower extremity, anxiety, prediabetes and GERD who presents for assessment of approximately 3 days of some subjective fevers chills, nausea and headache.  He states he also has some chronic soreness in his left flank but this is not any different than usual and has been present for at least a year.  No new chest pain, cough, shortness of breath, abdominal pain, back pain, vomiting, diarrhea, burning with urination, blood in stool, blood in his urine, melanotic stools, rash or any acute focal extremity weakness numbness pain or tingling.  He denies any earache, sore throat, vision changes, vertigo or other clear associated sick symptoms.  Denies any significant EtOH or illicit drug use.  He states he has been taking some over-the-counter nausea medicines which have helped significantly.  He also notes he has been consuming a significant mount of salt over the last couple days and is not sure if this is related or not.         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   Meningioma ([place])    Meningioma, recurrent of brain ([place]) 01/31/2019   Neuropathy 12/05/2019   Pre-diabetes    Rotator cuff tear    right   Seizures ([place])     11/27/19 last sz 1 wk ago    Patient Active Problem List   Diagnosis Date Noted   Pain due to onychomycosis of toenails of both feet 03/04/2021   Arthritis of right shoulder region    S/P reverse total shoulder arthroplasty, 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   Essential hypertension 07/02/2020   Prediabetes 07/02/2020   Polyuria 07/02/2020   Nontraumatic complete tear of right rotator cuff    Impingement syndrome of right shoulder    Neuropathy 12/05/2019   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[place]) 03/07/2019   Seizures ([place]) 01/31/2019   Meningioma, recurrent of brain ([place]) 01/31/2019   Meningioma ([place]) 01/03/2019   Diffuse idiopathic skeletal hyperostosis 06/05/2013    Past Surgical History:  Procedure Laterality Date   APPLICATION OF CRANIAL NAVIGATION N/A 01/10/2019   Procedure: APPLICATION OF CRANIAL NAVIGATION;  Surgeon: Erline Levine, MD;  Location: [place];  Service: Neurosurgery;  Laterality: N/A;   CATARACT EXTRACTION Right    CRANIOTOMY Right 11/06/2012   Procedure: CRANIOTOMY TUMOR EXCISION;  Surgeon: Erline Levine, MD;  Location: [place] [facility];  Service: Neurosurgery;  Laterality: Right;  Right Parasagittal craniotomy for meningioma with Stealth   CRANIOTOMY Right 01/10/2019   Procedure: Right Parasagittal Craniotomy for Tumor;  Surgeon: Erline Levine, MD;  Location: [place];  Service: Neurosurgery;  Laterality: Right;  Right parasagittal craniotomy for tumor   ESOPHAGOGASTRODUODENOSCOPY     JOINT REPLACEMENT Right 2018   right hip   REVERSE SHOULDER ARTHROPLASTY Right  01/08/2021   Procedure: RIGHT REVERSE SHOULDER ARTHROPLASTY;  Surgeon: Meredith Pel, MD;  Location: [place];  Service: Orthopedics;  Laterality: Right;   right knee arthroscopy     SHOULDER ARTHROSCOPY Right 06/09/2020   Procedure: RIGHT SHOULDER ARTHROSCOPY AND DEBRIDEMENT;  Surgeon: Newt Minion, MD;  Location: [place];  Service: Orthopedics;  Laterality: Right;    Prior to Admission  medications   Medication Sig Start Date End Date Taking? Authorizing Provider  Accu-Chek Softclix Lancets lancets Use as instructed 03/11/21   Argentina Donovan, PA-C  acetaminophen (TYLENOL) 500 MG tablet Take 500 mg by mouth every 6 (six) hours as needed for moderate pain or headache.    [provider]  albuterol (VENTOLIN HFA) 108 (90 Base) MCG/ACT inhaler Inhale 2 puffs into the lungs every 6 (six) hours as needed for wheezing or shortness of breath. 09/06/20   Merlyn Lot, MD  amLODipine (NORVASC) 5 MG tablet TAKE 1 TABLET (5 MG TOTAL) BY MOUTH DAILY. TO LOWER BLOOD PRESSURE. STOP LOSARTAN 03/11/21   Argentina Donovan, PA-C  aspirin 81 MG chewable tablet Chew 81 mg by mouth daily.    [provider]  atorvastatin (LIPITOR) 20 MG tablet Take 1 tablet (20 mg total) by mouth daily. 03/11/21   Argentina Donovan, PA-C  Blood Glucose Monitoring Suppl (ACCU-CHEK GUIDE ME) w/Device KIT Use to check blood sugar once daily. 01/07/21   Charlott Rakes, MD  butalbital-acetaminophen-caffeine (FIORICET) 50-325-40 MG tablet Take 1-2 tablets by mouth every 6 (six) hours as needed for headache. 03/19/21 03/19/22  Harvest Dark, MD  clonazePAM (KLONOPIN) 0.5 MG tablet TAKE 1 TABLET BY MOUTH 2 TIMES DAILY. 04/29/21   [name], MD  docusate sodium (COLACE) 100 MG capsule Take 1 capsule (100 mg total) by mouth 2 (two) times daily. 01/09/21   Meredith Pel, MD  glucose blood (ACCU-CHEK GUIDE) test strip Use to check blood sugar once daily. 03/11/21   Argentina Donovan, PA-C  Lacosamide (VIMPAT) 100 MG TABS Take 1 tablet (100 mg total) by mouth in the morning and at bedtime. 04/01/21   [name], MD  lamoTRIgine (LAMICTAL) 100 MG tablet TAKE 2 TABLETS BY MOUTH TWICE A DAY 04/12/21   Vaslow, Acey Lav, MD  metFORMIN (GLUCOPHAGE) 500 MG tablet Take 1 tablet (500 mg total) by mouth 2 (two) times daily. 03/11/21   Argentina Donovan, PA-C  Misc. Devices MISC Please provide BP cuff for  patient 07/08/20   Argentina Donovan, PA-C  pantoprazole (PROTONIX) 20 MG tablet Take 1 tablet (20 mg total) by mouth 2 (two) times daily before a meal. 2/64/15 03/16/93  Jodi Marble, MD  tamsulosin (FLOMAX) 0.4 MG CAPS capsule Take 1 capsule (0.4 mg total) by mouth daily. 07/23/20   Billey Co, MD  VITAMIN A PO Take 1 capsule by mouth daily.    [provider]    Allergies Patient has no known allergies.  Family History  Problem Relation Age of Onset   Hypertension Mother    Dementia Mother    Diabetes Mother    Hypertension Father    CAD Father    Diabetes Father    CAD Brother     Social History Social History   Tobacco Use   Smoking status: Former    Types: Cigarettes    Quit date: 01/29/2019    Years since quitting: 2.3   Smokeless tobacco: Never   Tobacco comments:    weekend smoker  Vaping Use   Vaping Use: Never used  Substance Use Topics   Alcohol use: Not Currently    Comment: social   Drug use: No    Review of Systems  Review of Systems  Constitutional:  Positive for chills and fever.  HENT:  Negative for sore throat.   Eyes:  Negative for pain.  Respiratory:  Negative for cough and stridor.   Cardiovascular:  Negative for chest pain.  Gastrointestinal:  Positive for nausea. Negative for vomiting.  Genitourinary:  Positive for flank pain (chronic). Negative for dysuria.  Musculoskeletal:  Negative for myalgias.  Skin:  Negative for rash.  Neurological:  Positive for sensory change (chronic LLE) and headaches. Negative for seizures and loss of consciousness.  Psychiatric/Behavioral:  Negative for suicidal ideas.   All other systems reviewed and are negative.    ____________________________________________   PHYSICAL EXAM:  VITAL SIGNS: ED Triage Vitals  Enc Vitals Group     BP 05/22/21 0435 (!) 153/100     Pulse Rate 05/22/21 0435 75     Resp 05/22/21 0435 16     Temp 05/22/21 0435 98 F (36.7 C)     Temp Source 05/22/21  0435 Oral     SpO2 05/22/21 0435 96 %     Weight --      Height --      Head Circumference --      Peak Flow --      Pain Score 05/22/21 0436 0     Pain Loc --      Pain Edu? --      Excl. in [place]? --    Vitals:   05/22/21 0435 05/22/21 0747  BP: (!) 153/100 (!) 135/94  Pulse: 75 63  Resp: 16 18  Temp: 98 F (36.7 C)   SpO2: 96% 98%   Physical Exam Vitals and nursing note reviewed.  Constitutional:      Appearance: He is well-developed.  HENT:     Head: Normocephalic and atraumatic.     Right Ear: External ear normal.     Left Ear: External ear normal.     Nose: Nose normal.  Eyes:     Conjunctiva/sclera: Conjunctivae normal.  Cardiovascular:     Rate and Rhythm: Normal rate and regular rhythm.     Heart sounds: No murmur heard. Pulmonary:     Effort: Pulmonary effort is normal. No respiratory distress.     Breath sounds: Normal breath sounds.  Abdominal:     Palpations: Abdomen is soft.     Tenderness: There is no abdominal tenderness.  Musculoskeletal:     Cervical back: Neck supple.  Skin:    General: Skin is warm and dry.     Capillary Refill: Capillary refill takes less than 2 seconds.  Neurological:     Mental Status: He is alert and oriented to person, place, and time.  Psychiatric:        Mood and Affect: Mood normal.    No pronator drift.  No finger dysmetria.  Cranial nerves II to XII are grossly intact.  TMs are unremarkable bilaterally.  Oropharynx is unremarkable.  Patient has full strength all extremities with slightly decrease sensation to light touch in left lower extremity but otherwise has full sensation throughout all extremities.  He states this is chronic.  His back is unremarkable.  His abdomen is soft nontender.  His left flank is unremarkable without any overlying skin changes or tenderness.   ____________________________________________   LABS (all  labs ordered are listed, but only abnormal results are displayed)  Labs Reviewed   URINALYSIS, COMPLETE (UACMP) WITH MICROSCOPIC - Abnormal; Notable for the following components:      Result Value   Color, Urine YELLOW (*)    APPearance CLEAR (*)    All other components within normal limits  RESP PANEL BY RT-PCR (FLU A&B, COVID) [id number]   ____________________________________________  EKG  ____________________________________________  RADIOLOGY  ED MD interpretation: Chest x-ray has no effusion, edema, pneumothorax, focal consolidation or other acute thoracic process.  Official radiology report(s): DG Chest 2 View  Result Date: 05/22/2021 CLINICAL DATA:  Chills, fever EXAM: CHEST - 2 VIEW COMPARISON:  03/31/2021 chest radiograph. FINDINGS: Partially visualized right total shoulder arthroplasty. Stable cardiomediastinal silhouette with normal heart size. No pneumothorax. No pleural effusion. Lungs appear clear, with no acute consolidative airspace disease and no pulmonary edema. IMPRESSION: No active cardiopulmonary disease. Electronically Signed   By: Ilona Sorrel M.D.   On: 05/22/2021 08:32    ____________________________________________   PROCEDURES  Procedure(s) performed (including Critical Care):  Procedures   ____________________________________________   INITIAL IMPRESSION / ASSESSMENT AND PLAN / ED COURSE      Patient presents with above-stated history exam for assessment of couple days of nausea, headache, chills and subjective fevers.  This in the setting of some chronic neuropathy in his left lower extremity and some chronic flank and left-sided pain.  On arrival he is afebrile hemodynamically stable although slightly hypertensive.  I suspect likely an acute viral infection.  There is no findings on history or exam to suggest deep space infection or abscess in the head or neck.  He has no fever cough or findings on chest x-ray to suggest pneumonia.  He has no findings on his UA to suggest urinary source of infection and has no blood or acute  flank pain to suggest pyelonephritis or kidney stone.  He does not appear septic or meningitic.  His abdomen is soft nontender throughout and given absence of any measured fevers with patient tolerating p.o. and with no vomiting diarrhea or urinary symptoms I have a low suspicion for immediate life-threatening acute intra-abdominal pathology.  COVID influenza PCR is negative.  UA is unremarkable.  Chest x-ray without any acute thoracic process.  Given stable vitals with eyes reassuring exam work-up I think patient is stable for discharge with continued outpatient evaluation and follow-up.  Discharged stable condition.  Stroke return precautions advised and discussed.          ____________________________________________   FINAL CLINICAL IMPRESSION(S) / ED DIAGNOSES  Final diagnoses:  Nonintractable headache, unspecified chronicity pattern, unspecified headache type  Nausea  Chills  Subjective fever    Medications  acetaminophen (TYLENOL) tablet 1,000 mg (1,000 mg Oral Given 05/22/21 0743)     ED Discharge Orders     None        Note:  This document was prepared using Dragon voice recognition software and may include unintentional dictation errors.    Lucrezia Starch, MD 05/22/21 [phone]

## 2021-05-24 LAB — CT NG M GENITALIUM NAA, URINE
Chlamydia trachomatis, NAA: NEGATIVE
Mycoplasma genitalium NAA: NEGATIVE
Neisseria gonorrhoeae, NAA: NEGATIVE

## 2021-05-24 NOTE — Telephone Encounter (Signed)
Pt has been called and informed of sleep study appointment.

## 2021-05-25 NOTE — Telephone Encounter (Signed)
Pt called in stating he is confused on where he is suppose to go, pt states he wants the referral sent to somewhere in [place], please advise.

## 2021-05-27 ENCOUNTER — Telehealth: Payer: Self-pay | Admitting: *Deleted

## 2021-05-27 ENCOUNTER — Other Ambulatory Visit: Payer: Self-pay | Admitting: Internal Medicine

## 2021-05-27 MED ORDER — LACOSAMIDE 100 MG PO TABS: 100.0000 mg | ORAL_TABLET | Freq: Two times a day (BID) | ORAL | 3 refills | Status: DC

## 2021-05-27 NOTE — Telephone Encounter (Signed)
Patient called as he is unable to get his VIMPAT refilled.  He stated that he was on Vimpat 200 BID back in May 2022 but then in Sept 2022 it was dropped to 100 mg BID, however, patient mixed up instructions and has reverted back to taking Vimpat 200 mg BID and just now realized he shouldn't have been dosing that way which has resulted in too early refill.  Needs NEW Rx for Vimpat 100 mg BID to [facility]  Routed to Dr Mickeal Skinner.

## 2021-05-28 ENCOUNTER — Other Ambulatory Visit: Payer: Self-pay | Admitting: Internal Medicine

## 2021-05-28 MED ORDER — LACOSAMIDE 100 MG PO TABS: 100.0000 mg | ORAL_TABLET | Freq: Two times a day (BID) | ORAL | 3 refills | Status: DC

## 2021-05-29 ENCOUNTER — Other Ambulatory Visit: Payer: Self-pay | Admitting: Hematology

## 2021-05-29 ENCOUNTER — Emergency Department
Admission: EM | Admit: 2021-05-29 | Discharge: 2021-05-29 | Discharge status: Against Medical Advice | Payer: Medicaid Other | Source: Home / Self Care

## 2021-05-29 NOTE — Progress Notes (Signed)
Patient called back his Vimpat refill. I confirmed in his chart that this was refilled by Dr Mickeal Skinner on 05/28/2021 and sent to his [place].  I called and left patient a message confirming that.  Brunetta Genera

## 2021-05-30 ENCOUNTER — Emergency Department
Admission: EM | Admit: 2021-05-30 | Discharge: 2021-05-30 | Disposition: A | Discharge status: Home / Self Care | Payer: Medicaid Other | Attending: Emergency Medicine | Admitting: Emergency Medicine

## 2021-05-30 ENCOUNTER — Other Ambulatory Visit: Payer: Self-pay

## 2021-05-30 DIAGNOSIS — Z7982 Long term (current) use of aspirin: Secondary | ICD-10-CM | POA: Diagnosis not present

## 2021-05-30 DIAGNOSIS — Z760 Encounter for issue of repeat prescription: Secondary | ICD-10-CM | POA: Diagnosis not present

## 2021-05-30 DIAGNOSIS — Z7984 Long term (current) use of oral hypoglycemic drugs: Secondary | ICD-10-CM | POA: Insufficient documentation

## 2021-05-30 DIAGNOSIS — Z85841 Personal history of malignant neoplasm of brain: Secondary | ICD-10-CM | POA: Insufficient documentation

## 2021-05-30 DIAGNOSIS — Z96641 Presence of right artificial hip joint: Secondary | ICD-10-CM | POA: Insufficient documentation

## 2021-05-30 DIAGNOSIS — C700 Malignant neoplasm of cerebral meninges: Secondary | ICD-10-CM

## 2021-05-30 DIAGNOSIS — Z96611 Presence of right artificial shoulder joint: Secondary | ICD-10-CM | POA: Diagnosis not present

## 2021-05-30 DIAGNOSIS — Z79899 Other long term (current) drug therapy: Secondary | ICD-10-CM | POA: Diagnosis not present

## 2021-05-30 DIAGNOSIS — I10 Essential (primary) hypertension: Secondary | ICD-10-CM | POA: Diagnosis not present

## 2021-05-30 DIAGNOSIS — Z87891 Personal history of nicotine dependence: Secondary | ICD-10-CM | POA: Diagnosis not present

## 2021-05-30 MED ORDER — LACOSAMIDE 100 MG PO TABS: 100.0000 mg | ORAL_TABLET | Freq: Two times a day (BID) | ORAL | 0 refills | Status: DC

## 2021-05-30 NOTE — ED Triage Notes (Signed)
Pt states he has been out of his seizure medication since Wednesday and tried to call his neurologist for a refill on Friday but they never got back with him.

## 2021-05-30 NOTE — Discharge Instructions (Signed)
Be sure and call your neurologist on Monday for more medication or keep your appointment on the 19th.  Your medication was sent to your pharmacy and continue taking as directed by your doctor.

## 2021-05-30 NOTE — ED Provider Notes (Signed)
[facility] Emergency Department Provider Note  ____________________________________________   Event Date/Time   First MD Initiated Contact with Patient 05/30/21 0902     (approximate)  I have reviewed the triage vital signs and the nursing notes.   HISTORY  Chief Complaint Medication Refill   HPI Joshua [name] is a 60 y.o. male presents to the ED for medication refill.  Patient states that he tried to get in touch with his neurologist on Friday but was unsuccessful in getting his medication refilled.         Past Medical History:  Diagnosis Date   Anxiety    Brain tumor ([place]) 02/20/2013   brain tumor removed in March 2014, Dr Donald Pore   Diffuse idiopathic skeletal hyperostosis 06/05/2013   Dizziness    Enlarged prostate    GERD (gastroesophageal reflux disease)    Headache(784.0)    scattered   High cholesterol    Hypertension    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[place]) 03/07/2019   Meningioma ([place])    Meningioma, recurrent of brain ([place]) 01/31/2019   Neuropathy 12/05/2019   Pre-diabetes    Rotator cuff tear    right   Seizures ([place])    11/27/19 last sz 1 wk ago    Patient Active Problem List   Diagnosis Date Noted   Pain due to onychomycosis of toenails of both feet 03/04/2021   Arthritis of right shoulder region    S/P reverse total shoulder arthroplasty, 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   Essential hypertension 07/02/2020   Prediabetes 07/02/2020   Polyuria 07/02/2020   Nontraumatic complete tear of right rotator cuff    Impingement syndrome of right shoulder    Neuropathy 12/05/2019   Malignant meningioma of meninges of brain ([place]) 03/07/2019   Seizures ([place]) 01/31/2019   Meningioma, recurrent of brain ([place]) 01/31/2019   Meningioma ([place]) 01/03/2019   Diffuse idiopathic skeletal hyperostosis 06/05/2013    Past Surgical History:  Procedure Laterality Date   APPLICATION OF CRANIAL NAVIGATION N/A 01/10/2019   Procedure:  APPLICATION OF CRANIAL NAVIGATION;  Surgeon: Erline Levine, MD;  Location: [place];  Service: Neurosurgery;  Laterality: N/A;   CATARACT EXTRACTION Right    CRANIOTOMY Right 11/06/2012   Procedure: CRANIOTOMY TUMOR EXCISION;  Surgeon: Erline Levine, MD;  Location: [place] [facility];  Service: Neurosurgery;  Laterality: Right;  Right Parasagittal craniotomy for meningioma with Stealth   CRANIOTOMY Right 01/10/2019   Procedure: Right Parasagittal Craniotomy for Tumor;  Surgeon: Erline Levine, MD;  Location: [place];  Service: Neurosurgery;  Laterality: Right;  Right parasagittal craniotomy for tumor   ESOPHAGOGASTRODUODENOSCOPY     JOINT REPLACEMENT Right 2018   right hip   REVERSE SHOULDER ARTHROPLASTY Right 01/08/2021   Procedure: RIGHT REVERSE SHOULDER ARTHROPLASTY;  Surgeon: Meredith Pel, MD;  Location: [place];  Service: Orthopedics;  Laterality: Right;   right knee arthroscopy     SHOULDER ARTHROSCOPY Right 06/09/2020   Procedure: RIGHT SHOULDER ARTHROSCOPY AND DEBRIDEMENT;  Surgeon: Newt Minion, MD;  Location: [place];  Service: Orthopedics;  Laterality: Right;    Prior to Admission medications   Medication Sig Start Date End Date Taking? Authorizing Provider  Accu-Chek Softclix Lancets lancets Use as instructed 03/11/21   Argentina Donovan, PA-C  acetaminophen (TYLENOL) 500 MG tablet Take 500 mg by mouth every 6 (six) hours as needed for moderate pain or headache.    [provider]  albuterol (VENTOLIN HFA) 108 (90 Base) MCG/ACT inhaler Inhale 2  puffs into the lungs every 6 (six) hours as needed for wheezing or shortness of breath. 09/06/20   Merlyn Lot, MD  amLODipine (NORVASC) 5 MG tablet TAKE 1 TABLET (5 MG TOTAL) BY MOUTH DAILY. TO LOWER BLOOD PRESSURE. STOP LOSARTAN 03/11/21   Argentina Donovan, PA-C  aspirin 81 MG chewable tablet Chew 81 mg by mouth daily.    [provider]  atorvastatin (LIPITOR) 20 MG tablet Take 1 tablet (20 mg total) by  mouth daily. 03/11/21   Argentina Donovan, PA-C  Blood Glucose Monitoring Suppl (ACCU-CHEK GUIDE ME) w/Device KIT Use to check blood sugar once daily. 01/07/21   Charlott Rakes, MD  clonazePAM (KLONOPIN) 0.5 MG tablet TAKE 1 TABLET BY MOUTH 2 TIMES DAILY. 04/29/21   [name], MD  docusate sodium (COLACE) 100 MG capsule Take 1 capsule (100 mg total) by mouth 2 (two) times daily. 01/09/21   Meredith Pel, MD  glucose blood (ACCU-CHEK GUIDE) test strip Use to check blood sugar once daily. 03/11/21   Argentina Donovan, PA-C  Lacosamide (VIMPAT) 100 MG TABS Take 1 tablet (100 mg total) by mouth in the morning and at bedtime. 05/30/21   Johnn Hai, PA-C  lamoTRIgine (LAMICTAL) 100 MG tablet TAKE 2 TABLETS BY MOUTH TWICE A DAY 05/24/21   Vaslow, Acey Lav, MD  metFORMIN (GLUCOPHAGE) 500 MG tablet Take 1 tablet (500 mg total) by mouth 2 (two) times daily. 03/11/21   Argentina Donovan, PA-C  Misc. Devices MISC Please provide BP cuff for patient 07/08/20   Argentina Donovan, PA-C  pantoprazole (PROTONIX) 20 MG tablet Take 1 tablet (20 mg total) by mouth 2 (two) times daily before a meal. 1/61/09 12/19/52  Jodi Marble, MD  tamsulosin (FLOMAX) 0.4 MG CAPS capsule Take 1 capsule (0.4 mg total) by mouth daily. 07/23/20   Billey Co, MD  VITAMIN A PO Take 1 capsule by mouth daily.    [provider]    Allergies Patient has no known allergies.  Family History  Problem Relation Age of Onset   Hypertension Mother    Dementia Mother    Diabetes Mother    Hypertension Father    CAD Father    Diabetes Father    CAD Brother     Social History Social History   Tobacco Use   Smoking status: Former    Types: Cigarettes    Quit date: 01/29/2019    Years since quitting: 2.3   Smokeless tobacco: Never   Tobacco comments:    weekend smoker  Vaping Use   Vaping Use: Never used  Substance Use Topics   Alcohol use: Not Currently    Comment: social   Drug use: No     Review of Systems Constitutional: No fever/chills Eyes: No visual changes. Cardiovascular: Denies chest pain. Respiratory: Denies shortness of breath. Gastrointestinal: No abdominal pain.  No nausea, no vomiting.  Genitourinary: Negative for dysuria. Musculoskeletal: Negative for musculoskeletal pain. Skin: Negative for rash. Neurological: Negative for headaches, focal weakness or numbness.  Positive history for seizures.  ____________________________________________   PHYSICAL EXAM:  VITAL SIGNS: ED Triage Vitals  Enc Vitals Group     BP 05/30/21 0817 134/80     Pulse Rate 05/30/21 0817 61     Resp 05/30/21 0817 17     Temp 05/30/21 0821 97.8 F (36.6 C)     Temp Source 05/30/21 0817 Oral     SpO2 05/30/21 0817 96 %  Weight 05/30/21 0817 180 lb (81.6 kg)     Height 05/30/21 0817 _0  (1.753 m)     Head Circumference --      Peak Flow --      Pain Score 05/30/21 0817 0     Pain Loc --      Pain Edu? --      Excl. in [place]? --     Constitutional: Alert and oriented. Well appearing and in no acute distress. Eyes: Conjunctivae are normal.  Head: Atraumatic. Nose: No congestion/rhinnorhea. Neck: No stridor.   Cardiovascular: Normal rate, regular rhythm. Grossly normal heart sounds.  Good peripheral circulation. Respiratory: Normal respiratory effort.  No retractions. Lungs CTAB. Gastrointestinal: Soft and nontender. No distention.  Musculoskeletal: No lower extremity tenderness nor edema.  No joint effusions. Neurologic:  Normal speech and language. No gross focal neurologic deficits are appreciated. No gait instability. Skin:  Skin is warm, dry and intact. No rash noted. Psychiatric: Mood and affect are normal. Speech and behavior are normal.  ____________________________________________   LABS (all labs ordered are listed, but only abnormal results are displayed)  Labs Reviewed - No data to  display ____________________________________________   PROCEDURES  Procedure(s) performed (including Critical Care):  Procedures   ____________________________________________   INITIAL IMPRESSION / ASSESSMENT AND PLAN / ED COURSE  As part of my medical decision making, I reviewed the following data within the electronic MEDICAL RECORD NUMBER Notes from prior ED visits and [place] Controlled Substance Database  60 year old male presents to the ED to get medication refill.  Patient currently takes Vimpat 100 mg twice daily and tried multiple times on Friday to get in touch with his neurologist.  He states that he has been completely out of medication for a few days and has an appointment with his neurologist on 06/03/2021 or 06/05/2021.  He is requesting a prescription to get him to his appointment time.  [facility]  Pharmacist did call and state that patient is getting his prescription filled earlier than expected.  She did call and get permission from Medicaid to override this for this 1 time.  Patient was encouraged to keep his appointment with the neurologist.  ____________________________________________   FINAL CLINICAL IMPRESSION(S) / ED DIAGNOSES  Final diagnoses:  Encounter for medication refill     ED Discharge Orders          Ordered    Lacosamide (VIMPAT) 100 MG TABS  2 times daily        05/30/21 0910    Lacosamide (VIMPAT) 100 MG TABS  2 times daily        Pending             Note:  This document was prepared using Dragon voice recognition software and may include unintentional dictation errors.    Johnn Hai, PA-C 05/30/21 1228    Lavonia Drafts, MD 05/30/21 1235

## 2021-06-01 ENCOUNTER — Other Ambulatory Visit: Payer: Self-pay

## 2021-06-01 ENCOUNTER — Inpatient Hospital Stay: Payer: Medicaid Other | Attending: Internal Medicine | Admitting: Internal Medicine

## 2021-06-01 ENCOUNTER — Telehealth: Payer: Self-pay | Admitting: Internal Medicine

## 2021-06-01 DIAGNOSIS — R569 Unspecified convulsions: Secondary | ICD-10-CM | POA: Diagnosis not present

## 2021-06-01 DIAGNOSIS — C700 Malignant neoplasm of cerebral meninges: Secondary | ICD-10-CM | POA: Diagnosis not present

## 2021-06-01 MED ORDER — CLONAZEPAM 0.5 MG PO TABS: 0.5000 mg | ORAL_TABLET | Freq: Every day | ORAL | 1 refills | Status: DC

## 2021-06-01 NOTE — Progress Notes (Signed)
I connected with Joshua [name] on 06/01/21 at 12:00 PM EST by telephone visit and verified that I am speaking with the correct person using two identifiers.  I discussed the limitations, risks, security and privacy concerns of performing an evaluation and management service by telemedicine and the availability of in-person appointments. I also discussed with the patient that there may be a patient responsible charge related to this service. The patient expressed understanding and agreed to proceed.  Other persons participating in the visit and their role in the encounter:  n/a  Patient's location:  Home  Provider's location:  Office   Chief Complaint:  Seizures ([place])  Malignant meningioma of meninges of brain ([place]) - Plan: clonazePAM (KLONOPIN) 0.5 MG tablet  History of Present Ilness: Joshua [name] describes improvement in overall energy, cognition since dropping the vimpat to 100mg  twice per day.  He did have only one very brief episode of leg shaking in the past 3 months.  Had some difficulty obtaining most recent refill but that is cleared up now. Observations: Language and cognition at baseline  Assessment and Plan: Seizures ([place])  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) - Plan: clonazePAM (KLONOPIN) 0.5 MG tablet  Clinically stable.  Recommended decreasing Klonopin to 0.5mg  HS if tolerated.  Will con't Lamictal 200mg  BID and Vimpat 100mg  BID for now.  Follow Up Instructions: We ask that Joshua [name] return to clinic in 3 months, or sooner as needed.  I discussed the assessment and treatment plan with the patient.  The patient was provided an opportunity to ask questions and all were answered.  The patient agreed with the plan and demonstrated understanding of the instructions.    The patient was advised to call back or seek an in-person evaluation if the symptoms worsen or if the condition fails to improve as anticipated.  I provided 5-10 minutes of  non-face-to-face time during this enocunter.  Joshua Sellers, MD   I provided 15 minutes of non face-to-face telephone visit time during this encounter, and > 50% was spent counseling as documented under my assessment & plan.

## 2021-06-01 NOTE — Telephone Encounter (Signed)
Scheduled per 11/15 los, pt has been called and confirmed appt

## 2021-06-02 ENCOUNTER — Other Ambulatory Visit: Payer: Self-pay | Admitting: Family Medicine

## 2021-06-02 DIAGNOSIS — R29818 Other symptoms and signs involving the nervous system: Secondary | ICD-10-CM

## 2021-06-17 ENCOUNTER — Telehealth: Payer: Self-pay | Admitting: Family Medicine

## 2021-06-17 NOTE — Telephone Encounter (Signed)
Copied from [place] [phone]. Topic: General - Other >> Jun 15, 2021  8:19 AM Celene Kras wrote: Reason for CRM: Pt called stating that he was seeing a provider while PCP was out of town. He is requesting to know who that was and the orthopedic doctor he was referred to. Please advise.

## 2021-06-18 ENCOUNTER — Ambulatory Visit (HOSPITAL_BASED_OUTPATIENT_CLINIC_OR_DEPARTMENT_OTHER): Payer: Medicaid Other | Attending: Family Medicine | Admitting: Internal Medicine

## 2021-06-19 DIAGNOSIS — H2513 Age-related nuclear cataract, bilateral: Secondary | ICD-10-CM | POA: Diagnosis not present

## 2021-06-19 DIAGNOSIS — H43393 Other vitreous opacities, bilateral: Secondary | ICD-10-CM | POA: Diagnosis not present

## 2021-06-21 NOTE — Telephone Encounter (Signed)
Pt was called and a VM was left informing patient more information is needed to place referral.

## 2021-06-23 NOTE — Telephone Encounter (Signed)
Pt calling back.  Referral Request - Has patient seen PCP for this complaint? No. Referral for which specialty: Orthopedic Surgery  [place] provider[place]  Reason for referral: previous shoulder surgery/shoulder bothering patient again/stiffness.

## 2021-06-24 NOTE — Telephone Encounter (Signed)
Routing to PCP for review.

## 2021-06-24 NOTE — Telephone Encounter (Signed)
On review of his chart he was last seen for his right shoulder pain by Dr. Marlou Sa [facility] in 02/2021.  Please from provide him with the number to call for an appointment.  Thanks.

## 2021-06-25 ENCOUNTER — Other Ambulatory Visit: Payer: Self-pay | Admitting: *Deleted

## 2021-06-25 DIAGNOSIS — C700 Malignant neoplasm of cerebral meninges: Secondary | ICD-10-CM

## 2021-06-25 DIAGNOSIS — R569 Unspecified convulsions: Secondary | ICD-10-CM

## 2021-06-25 MED ORDER — LAMOTRIGINE 100 MG PO TABS: 200.0000 mg | ORAL_TABLET | Freq: Two times a day (BID) | ORAL | 0 refills | Status: DC

## 2021-06-25 NOTE — Telephone Encounter (Signed)
Pt was called and VM was left informing patient to reach out to Dr.[name] with [facility].

## 2021-06-29 ENCOUNTER — Telehealth: Payer: Self-pay | Admitting: *Deleted

## 2021-06-29 ENCOUNTER — Other Ambulatory Visit: Payer: Self-pay | Admitting: Internal Medicine

## 2021-06-29 MED ORDER — LACOSAMIDE 100 MG PO TABS: 100.0000 mg | ORAL_TABLET | Freq: Two times a day (BID) | ORAL | 3 refills | Status: DC

## 2021-06-29 NOTE — Telephone Encounter (Signed)
Patient needs new prescription for VIMPAT 100 mg BID.  Last Rx was refilled by on call provider.    Route to pharmacy on file.

## 2021-07-07 ENCOUNTER — Other Ambulatory Visit: Payer: Self-pay

## 2021-07-07 DIAGNOSIS — C700 Malignant neoplasm of cerebral meninges: Secondary | ICD-10-CM

## 2021-07-08 ENCOUNTER — Other Ambulatory Visit: Payer: Self-pay | Admitting: Internal Medicine

## 2021-07-08 DIAGNOSIS — C700 Malignant neoplasm of cerebral meninges: Secondary | ICD-10-CM

## 2021-07-08 MED ORDER — CLONAZEPAM 0.5 MG PO TABS: 0.5000 mg | ORAL_TABLET | Freq: Every day | ORAL | 1 refills | Status: DC

## 2021-07-21 ENCOUNTER — Ambulatory Visit: Payer: Medicaid Other | Attending: Neurology

## 2021-07-21 ENCOUNTER — Other Ambulatory Visit: Payer: Medicaid Other

## 2021-07-26 ENCOUNTER — Telehealth: Payer: Self-pay | Admitting: Orthopedic Surgery

## 2021-07-26 NOTE — Telephone Encounter (Signed)
IC s/w patient. He stated he no longer needs this order.

## 2021-07-26 NOTE — Telephone Encounter (Signed)
Pt called stating he told Dr[facility][name] [name] said that sounded like a good idea. Pt states he was told we would have to get the ok from his insurance and then we would let him know when we send the referral. Pt states he hasn't heard anything and would like a CB to discuss where he is in the process.   [phone]

## 2021-07-27 ENCOUNTER — Ambulatory Visit: Payer: Medicaid Other | Attending: Family Medicine | Admitting: Family Medicine

## 2021-07-27 ENCOUNTER — Encounter: Payer: Self-pay | Admitting: Family Medicine

## 2021-07-27 ENCOUNTER — Other Ambulatory Visit: Payer: Self-pay

## 2021-07-27 VITALS — BP 123/78 | HR 93 | Ht 69.0 in | Wt 197.0 lb

## 2021-07-27 DIAGNOSIS — R29818 Other symptoms and signs involving the nervous system: Secondary | ICD-10-CM

## 2021-07-27 DIAGNOSIS — J328 Other chronic sinusitis: Secondary | ICD-10-CM | POA: Diagnosis not present

## 2021-07-27 MED ORDER — FLUTICASONE PROPIONATE 50 MCG/ACT NA SUSP: 2.0000 | Freq: Every day | NASAL | 1 refills | Status: DC

## 2021-07-27 MED ORDER — CETIRIZINE HCL 10 MG PO TABS: 10.0000 mg | ORAL_TABLET | Freq: Every day | ORAL | 1 refills | Status: DC

## 2021-07-27 NOTE — Progress Notes (Signed)
Subjective:  Patient ID: Joshua [name], male    DOB: June 30, 1961  Age: 61 y.o. MRN: [id number]  CC: Hypertension   HPI Joshua [name] is a 61 y.o. year old male with a history of meningioma status post resection in 2014 hospitalized at [facility] from 01/03/2019 through 01/14/2019 for recurrent meningioma status post resection (the last of which was in 12/2018), seizures, Prediabetes, Hypertension.  Interval History: He needs a new sleep study order placed in a [place] sleep lab as previous order was for [place] while he lived in [place] but he has relocated to [place] He would like a referral to ENT as 'he would just like it checked as he has a lot of phlegm in his throat.' Also has dry mouth and is using Biotene without relief. [facility] and will need transportation for this.  He states his insurance company will be sending me a form to verify necessity for transportation. Doing well on his chronic medications. Past Medical History:  Diagnosis Date   Anxiety    Brain tumor ([place]) 02/20/2013   brain tumor removed in March 2014, Dr Donald Pore   Diffuse idiopathic skeletal hyperostosis 06/05/2013   Dizziness    Enlarged prostate    GERD (gastroesophageal reflux disease)    Headache(784.0)    scattered   High cholesterol    Hypertension    Malignant meningioma of meninges of brain ([place]) 03/07/2019   Meningioma ([place])    Meningioma, recurrent of brain ([place]) 01/31/2019   Neuropathy 12/05/2019   Pre-diabetes    Rotator cuff tear    right   Seizures ([place])    11/27/19 last sz 1 wk ago    Past Surgical History:  Procedure Laterality Date   APPLICATION OF CRANIAL NAVIGATION N/A 01/10/2019   Procedure: APPLICATION OF CRANIAL NAVIGATION;  Surgeon: Erline Levine, MD;  Location: [facility];  Service: Neurosurgery;  Laterality: N/A;   CATARACT EXTRACTION Right    CRANIOTOMY Right 11/06/2012   Procedure: CRANIOTOMY TUMOR EXCISION;   Surgeon: Erline Levine, MD;  Location: [facility];  Service: Neurosurgery;  Laterality: Right;  Right Parasagittal craniotomy for meningioma with Stealth   CRANIOTOMY Right 01/10/2019   Procedure: Right Parasagittal Craniotomy for Tumor;  Surgeon: Erline Levine, MD;  Location: [facility];  Service: Neurosurgery;  Laterality: Right;  Right parasagittal craniotomy for tumor   ESOPHAGOGASTRODUODENOSCOPY     JOINT REPLACEMENT Right 2018   right hip   REVERSE SHOULDER ARTHROPLASTY Right 01/08/2021   Procedure: RIGHT REVERSE SHOULDER ARTHROPLASTY;  Surgeon: Meredith Pel, MD;  Location: [facility];  Service: Orthopedics;  Laterality: Right;   right knee arthroscopy     SHOULDER ARTHROSCOPY Right 06/09/2020   Procedure: RIGHT SHOULDER ARTHROSCOPY AND DEBRIDEMENT;  Surgeon: Newt Minion, MD;  Location: [facility];  Service: Orthopedics;  Laterality: Right;    Family History  Problem Relation Age of Onset   Hypertension Mother    Dementia Mother    Diabetes Mother    Hypertension Father    CAD Father    Diabetes Father    CAD Brother     No Known Allergies  Outpatient Medications Prior to Visit  Medication Sig Dispense Refill   Accu-Chek Softclix Lancets lancets Use as instructed 100 each 12   acetaminophen (TYLENOL) 500 MG tablet Take 500 mg by mouth every 6 (six) hours as needed for moderate pain or headache.     albuterol (VENTOLIN HFA)  108 (90 Base) MCG/ACT inhaler Inhale 2 puffs into the lungs every 6 (six) hours as needed for wheezing or shortness of breath. 8 g 2   amLODipine (NORVASC) 5 MG tablet TAKE 1 TABLET (5 MG TOTAL) BY MOUTH DAILY. TO LOWER BLOOD PRESSURE. STOP LOSARTAN 90 tablet 1   aspirin 81 MG chewable tablet Chew 81 mg by mouth daily.     atorvastatin (LIPITOR) 20 MG tablet Take 1 tablet (20 mg total) by mouth daily. 90 tablet 1   Blood Glucose Monitoring Suppl (ACCU-CHEK GUIDE ME) w/Device KIT Use to check blood sugar once daily. 1 kit 0   clonazePAM  (KLONOPIN) 0.5 MG tablet Take 1 tablet (0.5 mg total) by mouth daily. 60 tablet 1   docusate sodium (COLACE) 100 MG capsule Take 1 capsule (100 mg total) by mouth 2 (two) times daily. 10 capsule 0   glucose blood (ACCU-CHEK GUIDE) test strip Use to check blood sugar once daily. 100 each 6   Lacosamide (VIMPAT) 100 MG TABS Take 1 tablet (100 mg total) by mouth in the morning and at bedtime. 60 tablet 3   lamoTRIgine (LAMICTAL) 100 MG tablet Take 2 tablets (200 mg total) by mouth 2 (two) times daily. 120 tablet 0   metFORMIN (GLUCOPHAGE) 500 MG tablet Take 1 tablet (500 mg total) by mouth 2 (two) times daily. 180 tablet 1   Misc. Devices MISC Please provide BP cuff for patient 1 each 0   pantoprazole (PROTONIX) 20 MG tablet Take 1 tablet (20 mg total) by mouth 2 (two) times daily before a meal. 60 tablet 3   tamsulosin (FLOMAX) 0.4 MG CAPS capsule Take 1 capsule (0.4 mg total) by mouth daily. 90 capsule 3   VITAMIN A PO Take 1 capsule by mouth daily.     No facility-administered medications prior to visit.     ROS Review of Systems  Constitutional:  Negative for activity change and appetite change.  HENT:  Positive for postnasal drip. Negative for sinus pressure and sore throat.   Eyes:  Negative for visual disturbance.  Respiratory:  Negative for cough, chest tightness and shortness of breath.   Cardiovascular:  Negative for chest pain and leg swelling.  Gastrointestinal:  Negative for abdominal distention, abdominal pain, constipation and diarrhea.  Endocrine: Negative.   Genitourinary:  Negative for dysuria.  Musculoskeletal:  Negative for joint swelling and myalgias.  Skin:  Negative for rash.  Allergic/Immunologic: Negative.   Neurological:  Negative for weakness, light-headedness and numbness.  Psychiatric/Behavioral:  Negative for dysphoric mood and suicidal ideas.    Objective:  BP 123/78    Pulse 93    Ht 5' 9"  (1.753 m)    Wt 197 lb (89.4 kg)    SpO2 97%    BMI 29.09 kg/m    BP/Weight 07/27/2021 05/30/2021 34/08/8766  Systolic BP 115 726 203  Diastolic BP 78 80 94  Wt. (Lbs) 197 180 179.9  BMI 29.09 26.58 26.57  Some encounter information is confidential and restricted. Go to Review Flowsheets activity to see all data.      Physical Exam Constitutional:      Appearance: He is well-developed.  HENT:     Right Ear: Tympanic membrane normal.     Left Ear: Tympanic membrane normal.     Mouth/Throat:     Mouth: Mucous membranes are moist.  Cardiovascular:     Rate and Rhythm: Normal rate.     Heart sounds: Normal heart sounds. No murmur heard. Pulmonary:  Effort: Pulmonary effort is normal.     Breath sounds: Normal breath sounds. No wheezing or rales.  Chest:     Chest wall: No tenderness.  Abdominal:     General: Bowel sounds are normal. There is no distension.     Palpations: Abdomen is soft. There is no mass.     Tenderness: There is no abdominal tenderness.  Musculoskeletal:        General: Normal range of motion.     Right lower leg: No edema.     Left lower leg: No edema.  Neurological:     Mental Status: He is alert and oriented to person, place, and time.  Psychiatric:        Mood and Affect: Mood normal.    CMP Latest Ref Rng & Units 03/31/2021 03/19/2021 01/05/2021  Glucose 70 - 99 mg/dL 138(H) 115(H) 115(H)  BUN 6 - 20 mg/dL 13 13 14   Creatinine 0.61 - 1.24 mg/dL 0.78 0.89 0.84  Sodium 135 - 145 mmol/L 141 140 140  Potassium 3.5 - 5.1 mmol/L 3.8 4.0 4.1  Chloride 98 - 111 mmol/L 108 105 106  CO2 22 - 32 mmol/L 25 28 25   Calcium 8.9 - 10.3 mg/dL 9.2 9.5 9.5  Total Protein 6.5 - 8.1 g/dL - - -  Total Bilirubin 0.3 - 1.2 mg/dL - - -  Alkaline Phos 38 - 126 U/L - - -  AST 15 - 41 U/L - - -  ALT 0 - 44 U/L - - -    Lipid Panel  No results found for: CHOL, TRIG, HDL, CHOLHDL, VLDL, LDLCALC, LDLDIRECT  CBC    Component Value Date/Time   WBC 6.4 03/31/2021 0205   RBC 4.81 03/31/2021 0205   HGB 15.1 03/31/2021 0205   HCT  42.9 03/31/2021 0205   PLT 250 03/31/2021 0205   MCV 89.2 03/31/2021 0205   MCH 31.4 03/31/2021 0205   MCHC 35.2 03/31/2021 0205   RDW 13.1 03/31/2021 0205   LYMPHSABS 1.8 10/16/2020 2041   MONOABS 1.2 (H) 10/16/2020 2041   EOSABS 0.1 10/16/2020 2041   BASOSABS 0.1 10/16/2020 2041    Lab Results  Component Value Date   HGBA1C 6.1 05/12/2021    Assessment & Plan:  1. Suspected sleep apnea - Split night study; Future  2. Other chronic sinusitis He is yet to try antihistamines or nasal corticosteroids No indication for ENT referral at this time and this conclusion has been arrived after shared decision making however I have informed him that if the new regimen  is ineffective I am happy to place an ENT referral. - fluticasone (FLONASE) 50 MCG/ACT nasal spray; Place 2 sprays into both nostrils daily.  Dispense: 16 g; Refill: 1 - cetirizine (ZYRTEC) 10 MG tablet; Take 1 tablet (10 mg total) by mouth daily.  Dispense: 30 tablet; Refill: 1    No orders of the defined types were placed in this encounter.  Return in about 3 months (around 10/25/2021) for Chronic medical conditions.       Charlott Rakes, MD, FAAFP. [facility] [place], [place]   07/27/2021, 4:26 PM

## 2021-07-27 NOTE — Progress Notes (Signed)
ENT referral New sleep study order.

## 2021-07-28 ENCOUNTER — Other Ambulatory Visit: Payer: Self-pay | Admitting: Internal Medicine

## 2021-07-28 DIAGNOSIS — C700 Malignant neoplasm of cerebral meninges: Secondary | ICD-10-CM

## 2021-07-28 DIAGNOSIS — R569 Unspecified convulsions: Secondary | ICD-10-CM

## 2021-08-03 ENCOUNTER — Telehealth: Payer: Self-pay

## 2021-08-03 NOTE — Telephone Encounter (Signed)
Patient dropped of [employer] forms and is needing them filled out. Advised of the 5-7 day return policy. Have placed forms in providers box.

## 2021-08-03 NOTE — Telephone Encounter (Signed)
SCAT form has been given to CM jane.

## 2021-08-05 ENCOUNTER — Other Ambulatory Visit: Payer: Self-pay

## 2021-08-05 ENCOUNTER — Emergency Department (HOSPITAL_COMMUNITY)
Admission: EM | Admit: 2021-08-05 | Discharge: 2021-08-05 | Disposition: A | Discharge status: Home / Self Care | Payer: Medicare Other | Attending: Emergency Medicine | Admitting: Emergency Medicine

## 2021-08-05 ENCOUNTER — Telehealth: Payer: Self-pay

## 2021-08-05 ENCOUNTER — Emergency Department (HOSPITAL_COMMUNITY): Payer: Medicare Other

## 2021-08-05 ENCOUNTER — Encounter (HOSPITAL_COMMUNITY): Payer: Self-pay | Admitting: Emergency Medicine

## 2021-08-05 DIAGNOSIS — R519 Headache, unspecified: Secondary | ICD-10-CM | POA: Insufficient documentation

## 2021-08-05 DIAGNOSIS — Z7982 Long term (current) use of aspirin: Secondary | ICD-10-CM | POA: Insufficient documentation

## 2021-08-05 LAB — CBC WITH DIFFERENTIAL/PLATELET
Abs Immature Granulocytes: 0.01 10*3/uL (ref 0.00–0.07)
Basophils Absolute: 0.1 10*3/uL (ref 0.0–0.1)
Basophils Relative: 1 %
Eosinophils Absolute: 0.1 10*3/uL (ref 0.0–0.5)
Eosinophils Relative: 1 %
HCT: 44.6 % (ref 39.0–52.0)
Hemoglobin: 15 g/dL (ref 13.0–17.0)
Immature Granulocytes: 0 %
Lymphocytes Relative: 30 %
Lymphs Abs: 2.1 10*3/uL (ref 0.7–4.0)
MCH: 31 pg (ref 26.0–34.0)
MCHC: 33.6 g/dL (ref 30.0–36.0)
MCV: 92.1 fL (ref 80.0–100.0)
Monocytes Absolute: 0.7 10*3/uL (ref 0.1–1.0)
Monocytes Relative: 10 %
Neutro Abs: 4 10*3/uL (ref 1.7–7.7)
Neutrophils Relative %: 58 %
Platelets: 236 10*3/uL (ref 150–400)
RBC: 4.84 MIL/uL (ref 4.22–5.81)
RDW: 13.3 % (ref 11.5–15.5)
WBC: 6.8 10*3/uL (ref 4.0–10.5)
nRBC: 0 % (ref 0.0–0.2)

## 2021-08-05 LAB — BASIC METABOLIC PANEL
Anion gap: 9 (ref 5–15)
BUN: 15 mg/dL (ref 6–20)
CO2: 23 mmol/L (ref 22–32)
Calcium: 8.9 mg/dL (ref 8.9–10.3)
Chloride: 108 mmol/L (ref 98–111)
Creatinine, Ser: 1 mg/dL (ref 0.61–1.24)
GFR, Estimated: >60 mL/min (ref >60)
Glucose, Bld: 107 mg/dL — ABNORMAL HIGH (ref 70–99)
Potassium: 3.9 mmol/L (ref 3.5–5.1)
Sodium: 140 mmol/L (ref 135–145)

## 2021-08-05 NOTE — Telephone Encounter (Signed)
Call placed to patient to complete application for [employer] [place]. Message left with call back requested to this CM.

## 2021-08-05 NOTE — ED Triage Notes (Signed)
Reports tingling in lips x2 days, and then sharp pains that go to both ears, states he has never had this happen before. Meningioma removal in December 2022. States sharp pain to both ears, stopped him in his tracks.

## 2021-08-05 NOTE — ED Provider Triage Note (Signed)
Emergency Medicine Provider Triage Evaluation Note  Joshua [name] , a 61 y.o. male  was evaluated in triage.  Pt complains of cephalagia.  Patient states over the past week or so he started to feel off balance and has had some word finding difficulties.  Today he experienced an interesting sensation in his lips.  States he had severe pain on the left side of his lips and it radiated up to his ear.  He had associated lip numbness following this.  The event only lasted a couple of seconds, however he has never had pain like this before so he was very concerned.  He has a history of 2 meningiomas and has had 2 craniotomies in the past.  The most recent one was 2 years ago.  Review of Systems  Positive:  Negative:   Physical Exam  BP (!) 143/92 (BP Location: Left Arm)    Pulse 88    Temp 98.6 F (37 C) (Oral)    Resp 18    SpO2 95%  Gen:   Awake, no distress   Resp:  Normal effort  MSK:   Moves extremities without difficulty  Other:  TM clear on left.  Neuro: no focal deficit noted on exam  Medical Decision Making  Medically screening exam initiated at 4:25 PM.  Appropriate orders placed.  Joshua [name] was informed that the remainder of the evaluation will be completed by another provider, this initial triage assessment does not replace that evaluation, and the importance of remaining in the ED until their evaluation is complete.     Adolphus Birchwood, PA-C 08/05/21 1626

## 2021-08-05 NOTE — ED Provider Notes (Signed)
[facility] DEPT Provider Note   CSN: [id number] Arrival date & time: 08/05/21  1545     History  Chief Complaint  Patient presents with   Numbness   Otalgia    SEARS ORAN is a 61 y.o. male.  61 year old male presents with acute onset of left-sided headache characterizes sharp and stabbing.  No associated photophobia or phonophobia.  No visual changes or eye pain.  Symptoms radiate to his left ear.  He denies any ear drainage at this time.  Feels back to his baseline without taking medications.      Home Medications Prior to Admission medications   Medication Sig Start Date End Date Taking? Authorizing Provider  Accu-Chek Softclix Lancets lancets Use as instructed 03/11/21   Argentina Donovan, PA-C  acetaminophen (TYLENOL) 500 MG tablet Take 500 mg by mouth every 6 (six) hours as needed for moderate pain or headache.    [provider]  albuterol (VENTOLIN HFA) 108 (90 Base) MCG/ACT inhaler Inhale 2 puffs into the lungs every 6 (six) hours as needed for wheezing or shortness of breath. 09/06/20   Merlyn Lot, MD  amLODipine (NORVASC) 5 MG tablet TAKE 1 TABLET (5 MG TOTAL) BY MOUTH DAILY. TO LOWER BLOOD PRESSURE. STOP LOSARTAN 03/11/21   Argentina Donovan, PA-C  aspirin 81 MG chewable tablet Chew 81 mg by mouth daily.    [provider]  atorvastatin (LIPITOR) 20 MG tablet Take 1 tablet (20 mg total) by mouth daily. 03/11/21   Argentina Donovan, PA-C  Blood Glucose Monitoring Suppl (ACCU-CHEK GUIDE ME) w/Device KIT Use to check blood sugar once daily. 01/07/21   Charlott Rakes, MD  cetirizine (ZYRTEC) 10 MG tablet Take 1 tablet (10 mg total) by mouth daily. 07/27/21   Charlott Rakes, MD  clonazePAM (KLONOPIN) 0.5 MG tablet Take 1 tablet (0.5 mg total) by mouth daily. 07/08/21   [name], MD  docusate sodium (COLACE) 100 MG capsule Take 1 capsule (100 mg total) by mouth 2 (two) times daily. 01/09/21   Meredith Pel, MD  fluticasone (FLONASE) 50 MCG/ACT nasal spray Place 2 sprays into both nostrils daily. 07/27/21   Charlott Rakes, MD  glucose blood (ACCU-CHEK GUIDE) test strip Use to check blood sugar once daily. 03/11/21   Argentina Donovan, PA-C  Lacosamide (VIMPAT) 100 MG TABS Take 1 tablet (100 mg total) by mouth in the morning and at bedtime. 06/29/21   [name], MD  lamoTRIgine (LAMICTAL) 100 MG tablet TAKE 2 TABLETS BY MOUTH 2 TIMES DAILY. 07/29/21   [name], MD  metFORMIN (GLUCOPHAGE) 500 MG tablet Take 1 tablet (500 mg total) by mouth 2 (two) times daily. 03/11/21   Argentina Donovan, PA-C  Misc. Devices MISC Please provide BP cuff for patient 07/08/20   Argentina Donovan, PA-C  pantoprazole (PROTONIX) 20 MG tablet Take 1 tablet (20 mg total) by mouth 2 (two) times daily before a meal. 08/05/12 7/82/95  Jodi Marble, MD  tamsulosin (FLOMAX) 0.4 MG CAPS capsule Take 1 capsule (0.4 mg total) by mouth daily. 07/23/20   Billey Co, MD  VITAMIN A PO Take 1 capsule by mouth daily.    [provider]      Allergies    Metformin    Review of Systems   Review of Systems  All other systems reviewed and are negative.  Physical Exam Updated Vital Signs BP 114/76    Pulse (!) 57  Temp 98.6 F (37 C) (Oral)    Resp 17    SpO2 100%  Physical Exam Vitals and nursing note reviewed.  Constitutional:      General: He is not in acute distress.    Appearance: Normal appearance. He is well-developed. He is not toxic-appearing.  HENT:     Head: Normocephalic and atraumatic.  Eyes:     General: Lids are normal.     Conjunctiva/sclera: Conjunctivae normal.     Pupils: Pupils are equal, round, and reactive to light.  Neck:     Thyroid: No thyroid mass.     Trachea: No tracheal deviation.  Cardiovascular:     Rate and Rhythm: Normal rate and regular rhythm.     Heart sounds: Normal heart sounds. No murmur heard.   No gallop.  Pulmonary:     Effort: Pulmonary effort  is normal. No respiratory distress.     Breath sounds: Normal breath sounds. No stridor. No decreased breath sounds, wheezing, rhonchi or rales.  Abdominal:     General: There is no distension.     Palpations: Abdomen is soft.     Tenderness: There is no abdominal tenderness. There is no rebound.  Musculoskeletal:        General: No tenderness. Normal range of motion.     Cervical back: Normal range of motion and neck supple.  Skin:    General: Skin is warm and dry.     Findings: No abrasion or rash.  Neurological:     Mental Status: He is alert and oriented to person, place, and time. Mental status is at baseline.     GCS: GCS eye subscore is 4. GCS verbal subscore is 5. GCS motor subscore is 6.     Cranial Nerves: No cranial nerve deficit.     Sensory: No sensory deficit.     Motor: Motor function is intact.  Psychiatric:        Attention and Perception: Attention normal.        Speech: Speech normal.        Behavior: Behavior normal.    ED Results / Procedures / Treatments   Labs (all labs ordered are listed, but only abnormal results are displayed) Labs Reviewed  BASIC METABOLIC PANEL - Abnormal; Notable for the following components:      Result Value   Glucose, Bld 107 (*)    All other components within normal limits  CBC WITH DIFFERENTIAL/PLATELET    EKG None  Radiology CT Head Wo Contrast  Result Date: 08/05/2021 CLINICAL DATA:  Worsening headache EXAM: CT HEAD WITHOUT CONTRAST TECHNIQUE: Contiguous axial images were obtained from the base of the skull through the vertex without intravenous contrast. RADIATION DOSE REDUCTION: This exam was performed according to the departmental dose-optimization program which includes automated exposure control, adjustment of the mA and/or kV according to patient size and/or use of iterative reconstruction technique. COMPARISON:  Head CT dated March 19, 2021 FINDINGS: Brain: Chronic white matter ischemic change. No evidence of  acute infarction, hemorrhage, hydrocephalus, extra-axial collection or mass lesion/mass effect. Vascular: No hyperdense vessel or unexpected calcification. Skull: Prior craniotomy.  Negative for fracture or focal lesion. Sinuses/Orbits: No acute finding. Other: None. IMPRESSION: No acute intracranial abnormality. Electronically Signed   By: Yetta Glassman M.D.   On: 08/05/2021 16:43    Procedures Procedures    Medications Ordered in ED Medications - No data to display  ED Course/ Medical Decision Making/ A&P  Medical Decision Making  Patient's work-up here is reassuring.  Head CT was negative for intracranial process.  Does have a history of meningiomas in the past.  He has no focal neurological deficits.  Suspect likely migraine.  Doubt subarachnoid hemorrhage.  Labs are also reassuring and we discharged home       Final Clinical Impression(s) / ED Diagnoses Final diagnoses:  None    Rx / DC Orders ED Discharge Orders     None         Lacretia Leigh, MD 08/05/21 2132

## 2021-08-06 ENCOUNTER — Telehealth: Payer: Self-pay

## 2021-08-06 NOTE — Telephone Encounter (Signed)
Called patient and left voicemail informing that Opal Sidles placed call to him and needed him to complete the application for [facility] [place].   Advised that Opal Sidles would reach out to him on Monday when she gets back in the office.

## 2021-08-06 NOTE — Telephone Encounter (Signed)
Pt called in to return the call and requested a call back, please advise.

## 2021-08-06 NOTE — Telephone Encounter (Signed)
Transition Care Management Follow-up Telephone Call Date of discharge and from where: 08/05/2021 from [facility] How have you been since you were released from the hospital? Pt stated that he is feeling better and did not have any questions or concerns at this time.  Any questions or concerns? No  Items Reviewed: Did the pt receive and understand the discharge instructions provided? Yes  Medications obtained and verified? Yes  Other? No  Any new allergies since your discharge? No  Dietary orders reviewed? No Do you have support at home? Yes   Functional Questionnaire: (I = Independent and D = Dependent) ADLs: I  Bathing/Dressing- I  Meal Prep- I  Eating- I  Maintaining continence- I  Transferring/Ambulation- I  Managing Meds- I  Follow up appointments reviewed:  PCP Hospital f/u appt confirmed? No   Specialist Hospital f/u appt confirmed? No  Are transportation arrangements needed? No  If their condition worsens, is the pt aware to call PCP or go to the Emergency Dept.? Yes Was the patient provided with contact information for the PCP's office or ED? Yes Was to pt encouraged to call back with questions or concerns? Yes

## 2021-08-10 ENCOUNTER — Telehealth: Payer: Self-pay

## 2021-08-10 NOTE — Telephone Encounter (Signed)
Call placed to patient and completed SC[employer] application.   Will fax to [facility] [place] eligibility when a fax is available.

## 2021-08-10 NOTE — Telephone Encounter (Signed)
Pt called again today asking if someone was going to call him back regarding the paperwork that he needs .  CB#  [phone]

## 2021-08-11 ENCOUNTER — Telehealth: Payer: Self-pay

## 2021-08-11 NOTE — Telephone Encounter (Signed)
Patient called to ask about available refills for Clonazepam. Last filled 07/08/22 (60 tabs) with 1 refill. Prescribed as 1 PO daily. Patient has not  been taking as prescribed. He takes 0.5 mg tablet in AM and again at QHS. Reviewed dosing instruction on MAR. Instructed pt to call pharmacy to request refill. He reports having difficulty falling asleep.  Routed to Dr. Mickeal Skinner.

## 2021-08-12 ENCOUNTER — Telehealth: Payer: Self-pay | Admitting: *Deleted

## 2021-08-12 ENCOUNTER — Other Ambulatory Visit: Payer: Self-pay | Admitting: *Deleted

## 2021-08-12 ENCOUNTER — Other Ambulatory Visit: Payer: Self-pay | Admitting: Family Medicine

## 2021-08-12 ENCOUNTER — Telehealth: Payer: Self-pay | Admitting: Urology

## 2021-08-12 DIAGNOSIS — R7303 Prediabetes: Secondary | ICD-10-CM

## 2021-08-12 DIAGNOSIS — R569 Unspecified convulsions: Secondary | ICD-10-CM

## 2021-08-12 DIAGNOSIS — I10 Essential (primary) hypertension: Secondary | ICD-10-CM

## 2021-08-12 DIAGNOSIS — I700 Atherosclerosis of aorta: Secondary | ICD-10-CM

## 2021-08-12 DIAGNOSIS — C700 Malignant neoplasm of cerebral meninges: Secondary | ICD-10-CM

## 2021-08-12 DIAGNOSIS — N401 Enlarged prostate with lower urinary tract symptoms: Secondary | ICD-10-CM

## 2021-08-12 DIAGNOSIS — R351 Nocturia: Secondary | ICD-10-CM

## 2021-08-12 MED ORDER — TAMSULOSIN HCL 0.4 MG PO CAPS: 0.4000 mg | ORAL_CAPSULE | Freq: Every day | ORAL | 3 refills | Status: DC

## 2021-08-12 MED ORDER — LAMOTRIGINE 100 MG PO TABS: 200.0000 mg | ORAL_TABLET | Freq: Two times a day (BID) | ORAL | 0 refills | Status: DC

## 2021-08-12 NOTE — Telephone Encounter (Signed)
Patient called and wanted his refill for the Flomax changed to his new pharmacy [facility]. New pharmacy is in his chart and the patient is aware that it will be sent in no later than tomorrow due to the late hour of the day.  Thanks, Sharyn Lull

## 2021-08-12 NOTE — Telephone Encounter (Signed)
RX sent to new pharmacy

## 2021-08-12 NOTE — Telephone Encounter (Signed)
Patient called in stating he needs refills on all medications included Blood pressure prescried by Dr Margarita Rana, but he doesn't know the name of them. Please call back. Pharmacy is  [place], [place] - [address] Phone:  [phone]  Fax:  [phone]

## 2021-08-12 NOTE — Telephone Encounter (Signed)
Patient called to report that he has misplaced some of his Klonopin.  Reviewed medication and upon telling him that he should of have had a 2 month supply before he could his second refill.  Then he mentioned that he was taking 1 tablet in the morning and 2 tablets at night because he couldn't sleep.    I explained again that he was taking them against the instructions.  Advised I would route to provider to review but encouraged him to try over the counter medications for sleep as it is too soon to refill.    Routing to provider.

## 2021-08-12 NOTE — Progress Notes (Signed)
Patient called to state that [facility] does not have Rx on file for Lamotrigine.  See that it went to [facility].  Reordering to [employer].

## 2021-08-13 ENCOUNTER — Other Ambulatory Visit: Payer: Self-pay

## 2021-08-13 ENCOUNTER — Encounter: Payer: Self-pay | Admitting: Surgical

## 2021-08-13 ENCOUNTER — Ambulatory Visit (INDEPENDENT_AMBULATORY_CARE_PROVIDER_SITE_OTHER): Payer: Medicare Other | Admitting: Surgical

## 2021-08-13 ENCOUNTER — Ambulatory Visit: Payer: Self-pay

## 2021-08-13 DIAGNOSIS — Z96611 Presence of right artificial shoulder joint: Secondary | ICD-10-CM | POA: Diagnosis not present

## 2021-08-13 MED ORDER — AMLODIPINE BESYLATE 5 MG PO TABS: ORAL_TABLET | ORAL | 0 refills | Status: AC

## 2021-08-13 MED ORDER — METFORMIN HCL 500 MG PO TABS: 500.0000 mg | ORAL_TABLET | Freq: Two times a day (BID) | ORAL | 1 refills | Status: AC

## 2021-08-13 MED ORDER — ACCU-CHEK GUIDE VI STRP: ORAL_STRIP | 6 refills | Status: DC

## 2021-08-13 MED ORDER — ALBUTEROL SULFATE HFA 108 (90 BASE) MCG/ACT IN AERS
2.0000 | INHALATION_SPRAY | Freq: Four times a day (QID) | RESPIRATORY_TRACT | 2 refills | Status: AC | PRN
Start: 2021-08-13 — End: ?

## 2021-08-13 MED ORDER — PANTOPRAZOLE SODIUM 20 MG PO TBEC: 20.0000 mg | DELAYED_RELEASE_TABLET | Freq: Two times a day (BID) | ORAL | 3 refills | Status: DC

## 2021-08-13 MED ORDER — ATORVASTATIN CALCIUM 20 MG PO TABS: 20.0000 mg | ORAL_TABLET | Freq: Every day | ORAL | 1 refills | Status: DC

## 2021-08-13 MED ORDER — ACCU-CHEK SOFTCLIX LANCETS MISC: 12 refills | Status: DC

## 2021-08-13 NOTE — Telephone Encounter (Signed)
Requested medications are due for refill today.  yes  Requested medications are on the active medications list.  yes  Last refill. 03/11/2021  Future visit scheduled.   no  Notes to clinic.  Failed protocol d/t missing labs.    Requested Prescriptions  Pending Prescriptions Disp Refills   atorvastatin (LIPITOR) 20 MG tablet 90 tablet 1    Sig: Take 1 tablet (20 mg total) by mouth daily.     Cardiovascular:  Antilipid - Statins Failed - 08/12/2021  4:01 PM      Failed - Total Cholesterol in normal range and within 360 days    No results found for: CHOL, POCCHOL, CHOLTOT        Failed - LDL in normal range and within 360 days    No results found for: LDLCALC, LDLC, HIRISKLDL, POCLDL, LDLDIRECT, REALLDLC, TOTLDLC        Failed - HDL in normal range and within 360 days    No results found for: HDL, POCHDL        Failed - Triglycerides in normal range and within 360 days    No results found for: TRIG, POCTRIG        Passed - Patient is not pregnant      Passed - Valid encounter within last 12 months    Recent Outpatient Visits           2 weeks ago Suspected sleep apnea   [place], Charlane Ferretti, MD   3 months ago Prediabetes   [place], Enobong, MD   5 months ago Prediabetes   [place], [place], [place]   8 months ago Prediabetes   [place], Charlane Ferretti, MD   10 months ago Kidney stone on right side   [place], Ileene Hutchinson, MD       Future Appointments             Today Magnant, Gerrianne Scale, PA-C [place]   In 3 months McGowan, Gordan Payment [facility]            Signed Prescriptions Disp Refills   amLODipine (NORVASC) 5 MG tablet 90 tablet 0    Sig: TAKE 1 TABLET (5 MG TOTAL) BY MOUTH DAILY. TO LOWER BLOOD PRESSURE. STOP LOSARTAN      Cardiovascular:  Calcium Channel Blockers Passed - 08/12/2021  4:01 PM      Passed - Last BP in normal range    BP Readings from Last 1 Encounters:  08/05/21 120/70          Passed - Valid encounter within last 6 months    Recent Outpatient Visits           2 weeks ago Suspected sleep apnea   [place], Charlane Ferretti, MD   3 months ago Prediabetes   [place], Enobong, MD   5 months ago Prediabetes   [place], [place]   8 months ago Prediabetes   [place], Charlane Ferretti, MD   10 months ago Kidney stone on right side   [place] Jodi Marble, MD       Future Appointments             Today Magnant,  Gerrianne Scale, PA-C [place]   In 3 months McGowan, Hunt Oris, PA-C [facility]             glucose blood (ACCU-CHEK GUIDE) test strip 100 each 6    Sig: Use to check blood sugar once daily.     Endocrinology: Diabetes - Testing Supplies Passed - 08/12/2021  4:01 PM      Passed - Valid encounter within last 12 months    Recent Outpatient Visits           2 weeks ago Suspected sleep apnea   [place], Enobong, MD   3 months ago Prediabetes   [place], Enobong, MD   5 months ago Prediabetes   [place], [place], [place]   8 months ago Prediabetes   [place], Charlane Ferretti, MD   10 months ago Kidney stone on right side   [place], Ileene Hutchinson, MD       Future Appointments             Today Magnant, Gerrianne Scale, PA-C [place]   In 3 months McGowan, Gordan Payment [facility]             metFORMIN (GLUCOPHAGE) 500 MG tablet  180 tablet 1    Sig: Take 1 tablet (500 mg total) by mouth 2 (two) times daily.     Endocrinology:  Diabetes - Biguanides Failed - 08/12/2021  4:01 PM      Failed - AA eGFR in normal range and within 360 days    GFR calc Af Amer  Date Value Ref Range Status  04/17/2020 >60 >60 mL/min Final   GFR, Estimated  Date Value Ref Range Status  08/05/2021 >60 >60 mL/min Final    Comment:    (NOTE) Calculated using the CKD-EPI Creatinine Equation (2021)           Passed - Cr in normal range and within 360 days    Creatinine, Ser  Date Value Ref Range Status  08/05/2021 1.00 0.61 - 1.24 mg/dL Final          Passed - HBA1C is between 0 and 7.9 and within 180 days    HbA1c, POC (controlled diabetic range)  Date Value Ref Range Status  05/12/2021 6.1 0.0 - 7.0 % Final          Passed - Valid encounter within last 6 months    Recent Outpatient Visits           2 weeks ago Suspected sleep apnea   [place], Charlane Ferretti, MD   3 months ago Prediabetes   [place], Enobong, MD   5 months ago Prediabetes   [place], [place]   8 months ago Prediabetes   [place], Charlane Ferretti, MD   10 months ago Kidney stone on right side   [place], Ileene Hutchinson, MD       Future Appointments             Today Magnant, Gerrianne Scale, PA-C [place]   In 3 months McGowan, Gordan Payment [facility]  pantoprazole (PROTONIX) 20 MG tablet 60 tablet 3    Sig: Take 1 tablet (20 mg total) by mouth 2 (two) times daily before a meal.     Gastroenterology: Proton Pump Inhibitors Passed - 08/12/2021  4:01 PM      Passed - Valid encounter within last 12 months    Recent Outpatient Visits           2 weeks ago Suspected sleep apnea   [place], Enobong, MD   3 months ago Prediabetes   [place], Enobong, MD   5 months ago Prediabetes   [place], [place], [place]   8 months ago Prediabetes   [place], Charlane Ferretti, MD   10 months ago Kidney stone on right side   [place], Ileene Hutchinson, MD       Future Appointments             Today Magnant, Gerrianne Scale, PA-C [place]   In 3 months McGowan, Gordan Payment [facility]             albuterol (VENTOLIN HFA) 108 (90 Base) MCG/ACT inhaler 8 g 2    Sig: Inhale 2 puffs into the lungs every 6 (six) hours as needed for wheezing or shortness of breath.     Pulmonology:  Beta Agonists Failed - 08/12/2021  4:01 PM      Failed - One inhaler should last at least one month. If the patient is requesting refills earlier, contact the patient to check for uncontrolled symptoms.      Passed - Valid encounter within last 12 months    Recent Outpatient Visits           2 weeks ago Suspected sleep apnea   [place], Enobong, MD   3 months ago Prediabetes   [place], Enobong, MD   5 months ago Prediabetes   [place], [place], [place]   8 months ago Prediabetes   [place], Charlane Ferretti, MD   10 months ago Kidney stone on right side   [place] Jodi Marble, MD       Future Appointments             Today Magnant, Gerrianne Scale, PA-C [place]   In 3 months McGowan, Gordan Payment [facility]             Accu-Chek Softclix Lancets lancets 100 each 12    Sig: Use as instructed     Endocrinology: Diabetes - Testing Supplies Passed - 08/12/2021  4:01 PM       Passed - Valid encounter within last 12 months    Recent Outpatient Visits           2 weeks ago Suspected sleep apnea   [place], Enobong, MD   3 months ago Prediabetes   [place], Enobong, MD   5 months ago Prediabetes   [place], [place]   8 months ago Prediabetes   [place], Charlane Ferretti, MD   10 months ago Kidney stone on right  side   [place] Jodi Marble, MD       Future Appointments             Today Magnant, Gerrianne Scale, PA-C [place]   In 3 months McGowan, Gordan Payment [facility]

## 2021-08-13 NOTE — Telephone Encounter (Signed)
Requested Prescriptions  Pending Prescriptions Disp Refills   amLODipine (NORVASC) 5 MG tablet 90 tablet 0    Sig: TAKE 1 TABLET (5 MG TOTAL) BY MOUTH DAILY. TO LOWER BLOOD PRESSURE. STOP LOSARTAN     Cardiovascular:  Calcium Channel Blockers Passed - 08/12/2021  4:01 PM      Passed - Last BP in normal range    BP Readings from Last 1 Encounters:  08/05/21 120/70         Passed - Valid encounter within last 6 months    Recent Outpatient Visits          2 weeks ago Suspected sleep apnea   [place], Enobong, MD   3 months ago Prediabetes   [place], Enobong, MD   5 months ago Prediabetes   [place], [place], [place]   8 months ago Prediabetes   [place], Charlane Ferretti, MD   10 months ago Kidney stone on right side   [place], Ileene Hutchinson, MD      Future Appointments            Today Magnant, Gerrianne Scale, PA-C [place]   In 3 months McGowan, Gordan Payment [facility]            atorvastatin (LIPITOR) 20 MG tablet 90 tablet 1    Sig: Take 1 tablet (20 mg total) by mouth daily.     Cardiovascular:  Antilipid - Statins Failed - 08/12/2021  4:01 PM      Failed - Total Cholesterol in normal range and within 360 days    No results found for: CHOL, POCCHOL, CHOLTOT       Failed - LDL in normal range and within 360 days    No results found for: LDLCALC, LDLC, HIRISKLDL, POCLDL, LDLDIRECT, REALLDLC, TOTLDLC       Failed - HDL in normal range and within 360 days    No results found for: HDL, POCHDL       Failed - Triglycerides in normal range and within 360 days    No results found for: TRIG, POCTRIG       Passed - Patient is not pregnant      Passed - Valid encounter within last 12 months    Recent Outpatient Visits          2 weeks ago Suspected  sleep apnea   [place], Charlane Ferretti, MD   3 months ago Prediabetes   [place], Enobong, MD   5 months ago Prediabetes   [place], [place], [place]   8 months ago Prediabetes   [place], Charlane Ferretti, MD   10 months ago Kidney stone on right side   [place], Ileene Hutchinson, MD      Future Appointments            Today Magnant, Gerrianne Scale, PA-C [place]   In 3 months McGowan, Hunt Oris, PA-C [facility]            glucose blood (ACCU-CHEK GUIDE) test strip 100 each 6    Sig: Use to check blood sugar once daily.     Endocrinology: Diabetes - Testing Supplies Passed -  08/12/2021  4:01 PM      Passed - Valid encounter within last 12 months    Recent Outpatient Visits          2 weeks ago Suspected sleep apnea   [place], Enobong, MD   3 months ago Prediabetes   [place], Enobong, MD   5 months ago Prediabetes   [place], [place], [place]   8 months ago Prediabetes   [place], Charlane Ferretti, MD   10 months ago Kidney stone on right side   [place], Ileene Hutchinson, MD      Future Appointments            Today Magnant, Gerrianne Scale, PA-C [place]   In 3 months McGowan, Gordan Payment [facility]            metFORMIN (GLUCOPHAGE) 500 MG tablet 180 tablet 1    Sig: Take 1 tablet (500 mg total) by mouth 2 (two) times daily.     Endocrinology:  Diabetes - Biguanides Failed - 08/12/2021  4:01 PM      Failed - AA eGFR in normal range and within 360 days    GFR calc Af Amer  Date Value Ref Range Status  04/17/2020 >60 >60 mL/min Final    GFR, Estimated  Date Value Ref Range Status  08/05/2021 >60 >60 mL/min Final    Comment:    (NOTE) Calculated using the CKD-EPI Creatinine Equation (2021)          Passed - Cr in normal range and within 360 days    Creatinine, Ser  Date Value Ref Range Status  08/05/2021 1.00 0.61 - 1.24 mg/dL Final         Passed - HBA1C is between 0 and 7.9 and within 180 days    HbA1c, POC (controlled diabetic range)  Date Value Ref Range Status  05/12/2021 6.1 0.0 - 7.0 % Final         Passed - Valid encounter within last 6 months    Recent Outpatient Visits          2 weeks ago Suspected sleep apnea   [place], Charlane Ferretti, MD   3 months ago Prediabetes   [place], Enobong, MD   5 months ago Prediabetes   [place], [place]   8 months ago Prediabetes   [place], Charlane Ferretti, MD   10 months ago Kidney stone on right side   [place], Ileene Hutchinson, MD      Future Appointments            Today Magnant, Gerrianne Scale, PA-C [place]   In 3 months McGowan, Hunt Oris, PA-C [facility]            pantoprazole (PROTONIX) 20 MG tablet 60 tablet 3    Sig: Take 1 tablet (20 mg total) by mouth 2 (two) times daily before a meal.     Gastroenterology: Proton Pump Inhibitors Passed - 08/12/2021  4:01 PM      Passed - Valid encounter within last 12 months    Recent Outpatient Visits          2 weeks  ago Suspected sleep apnea   [place], Enobong, MD   3 months ago Prediabetes   [place], Enobong, MD   5 months ago Prediabetes   [place], [place], [place]   8 months ago Prediabetes   [place], Charlane Ferretti, MD    10 months ago Kidney stone on right side   [place], Ileene Hutchinson, MD      Future Appointments            Today Magnant, Gerrianne Scale, PA-C [place]   In 3 months McGowan, Gordan Payment [facility]            albuterol (VENTOLIN HFA) 108 (90 Base) MCG/ACT inhaler 8 g 2    Sig: Inhale 2 puffs into the lungs every 6 (six) hours as needed for wheezing or shortness of breath.     Pulmonology:  Beta Agonists Failed - 08/12/2021  4:01 PM      Failed - One inhaler should last at least one month. If the patient is requesting refills earlier, contact the patient to check for uncontrolled symptoms.      Passed - Valid encounter within last 12 months    Recent Outpatient Visits          2 weeks ago Suspected sleep apnea   [place], Enobong, MD   3 months ago Prediabetes   [place], Enobong, MD   5 months ago Prediabetes   [place], [place], [place]   8 months ago Prediabetes   [place], Charlane Ferretti, MD   10 months ago Kidney stone on right side   [place] Jodi Marble, MD      Future Appointments            Today Magnant, Gerrianne Scale, PA-C [place]   In 3 months McGowan, Gordan Payment [facility]            Accu-Chek Softclix Lancets lancets 100 each 12    Sig: Use as instructed     Endocrinology: Diabetes - Testing Supplies Passed - 08/12/2021  4:01 PM      Passed - Valid encounter within last 12 months    Recent Outpatient Visits          2 weeks ago Suspected sleep apnea   [place], Enobong, MD   3 months ago Prediabetes   [place], Enobong, MD   5 months ago Prediabetes   [place], [place]   8 months ago Prediabetes   [place], Charlane Ferretti, MD   10 months ago Kidney stone on right side   [place], Ileene Hutchinson, MD      Future Appointments            Today Magnant, Gerrianne Scale, PA-C [place]   In 3 months McGowan, Gordan Payment [facility]

## 2021-08-13 NOTE — Progress Notes (Signed)
Office Visit Note   Patient: Joshua [name]           Date of Birth: Nov 21, 1960           MRN: [id number] Visit Date: 08/13/2021 Requested by: Charlott Rakes, MD [place],  [place] [id number] PCP: Charlott Rakes, MD  Subjective: Chief Complaint  Patient presents with   Right Shoulder - Follow-up    HPI: Joshua [name] is a 61 y.o. male who presents to the office complaining of right shoulder pain.  Patient is s/p right reverse shoulder arthroplasty on 01/08/2021.  He states that his shoulder is painful and "feels like it did before surgery".  Localizes pain to the superior and anterior aspects of the right shoulder.  He states his range of motion is okay but notices a lot of pain with lifting.  He has occasional neck pain.  No numbness or tingling.  No scapular or radicular pain.  He denies any specific injury or fall.  No instability of the shoulder.  He does state that he has not adhered to the 15 to 20 pound lifting restriction at times though he has made an effort to avoid heavy lifting overall.  No fevers or chills or any change in the appearance of the incision.  He has occasional seizures..                ROS: All systems reviewed are negative as they relate to the chief complaint within the history of present illness.  Patient denies fevers or chills.  Assessment & Plan: Visit Diagnoses:  1. History of arthroplasty of right shoulder     Plan: Patient is a 61 year old male who presents complaining of right shoulder pain.  He is s/p right reverse shoulder arthroplasty on 01/08/2021.  Most of his pain he localizes to the anterior aspect of the shoulder in the midportion of the incision.  He has weakness of the subscapularis that is new compared with prior exam.  Operative note does show subscapularis was repaired at time of surgery.  He also has exam findings that are concerning for bicep tendon pain.  Impression is tearing of the proximal bicep tenodesis site  versus subscapularis injury versus a combination of the 2.  The subscapularis is definitely weaker compared with the other side though fortunately it is not causing him any instability of the shoulder.  Plan is to try anti-inflammatory and follow-up in 1 month for clinical recheck with Dr. Marlou Sa.  If most of his pain is coming from tearing of the proximal bicep tendon, expect that this will improve with time.    Follow-Up Instructions: No follow-ups on file.   Orders:  Orders Placed This Encounter  Procedures   XR Shoulder Right   No orders of the defined types were placed in this encounter.     Procedures: No procedures performed   Clinical Data: No additional findings.  Objective: Vital Signs: There were no vitals taken for this visit.  Physical Exam:  Constitutional: Patient appears well-developed HEENT:  Head: Normocephalic Eyes:EOM are normal Neck: Normal range of motion Cardiovascular: Normal rate Pulmonary/chest: Effort normal Neurologic: Patient is alert Skin: Skin is warm Psychiatric: Patient has normal mood and affect  Ortho Exam: Ortho exam demonstrates right shoulder with 35 degrees external rotation, 90 degrees abduction, 100 degrees forward flexion.  No significant pain with passive range of motion.  No change in the appearance of the incision and the incision appears well-healed without evidence of  infection or dehiscence.  There is no sinus tract noted.  He has intact axillary nerve with deltoid firing on exam.  He does have asymmetric weakness of subscapularis of the right shoulder that is not present on the left side.  Positive belly press test.  He also has pain with resisted supination and bicep flexion.  Subtle change in bicep contour of the right shoulder compared with the left  Specialty Comments:  No specialty comments available.  Imaging: No results found.   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   Seizures ([place]) 01/31/2019   Meningioma, recurrent of brain ([place]) 01/31/2019   Meningioma ([place]) 01/03/2019   Diffuse idiopathic skeletal hyperostosis 06/05/2013   Past Medical History:  Diagnosis Date   Anxiety    Brain tumor ([place]) 02/20/2013   brain tumor removed in March 2014, Dr Donald Pore   Diffuse idiopathic skeletal hyperostosis 06/05/2013   Dizziness    Enlarged prostate    GERD (gastroesophageal reflux disease)    Headache(784.0)    scattered   High cholesterol    Hypertension    Malignant meningioma of meninges of brain ([facility]) 03/07/2019   Meningioma (H[facility])    Meningioma, recurrent of brain ([place]) 01/31/2019   Neuropathy 12/05/2019   Pre-diabetes    Rotator cuff tear    right   Seizures ([place])    11/27/19 last sz 1 wk ago    Family History  Problem Relation Age of Onset   Hypertension Mother    Dementia Mother    Diabetes Mother    Hypertension Father    CAD Father    Diabetes Father    CAD Brother     Past Surgical History:  Procedure Laterality Date   APPLICATION OF CRANIAL NAVIGATION N/A 01/10/2019   Procedure: APPLICATION OF CRANIAL NAVIGATION;  Surgeon: Erline Levine, MD;  Location: [facility];  Service: Neurosurgery;  Laterality: N/A;   CATARACT EXTRACTION Right    CRANIOTOMY Right 11/06/2012   Procedure: CRANIOTOMY TUMOR EXCISION;  Surgeon: Erline Levine, MD;  Location: [facility];  Service: Neurosurgery;  Laterality: Right;  Right Parasagittal craniotomy for meningioma with Stealth   CRANIOTOMY Right 01/10/2019   Procedure: Right Parasagittal Craniotomy  for Tumor;  Surgeon: Erline Levine, MD;  Location: [facility];  Service: Neurosurgery;  Laterality: Right;  Right parasagittal craniotomy for tumor   ESOPHAGOGASTRODUODENOSCOPY     JOINT REPLACEMENT Right 2018   right hip   REVERSE SHOULDER ARTHROPLASTY Right 01/08/2021   Procedure: RIGHT REVERSE SHOULDER ARTHROPLASTY;  Surgeon: Meredith Pel, MD;  Location: [facility];  Service: Orthopedics;  Laterality: Right;   right knee arthroscopy     SHOULDER ARTHROSCOPY Right 06/09/2020   Procedure: RIGHT SHOULDER ARTHROSCOPY AND DEBRIDEMENT;  Surgeon: Newt Minion, MD;  Location: [facility];  Service: Orthopedics;  Laterality: Right;   Social History   Occupational History   Not on file  Tobacco Use   Smoking status: Former    Types: Cigarettes    Quit date: 01/29/2019    Years since  quitting: 2.5   Smokeless tobacco: Never   Tobacco comments:    weekend smoker  Vaping Use   Vaping Use: Never used  Substance and Sexual Activity   Alcohol use: Not Currently    Comment: social   Drug use: No   Sexual activity: Not Currently

## 2021-08-15 ENCOUNTER — Encounter (HOSPITAL_COMMUNITY): Payer: Self-pay

## 2021-08-15 ENCOUNTER — Emergency Department (HOSPITAL_COMMUNITY)
Admission: EM | Admit: 2021-08-15 | Discharge: 2021-08-15 | Disposition: A | Discharge status: Home / Self Care | Payer: Medicare Other | Attending: Emergency Medicine | Admitting: Emergency Medicine

## 2021-08-15 ENCOUNTER — Emergency Department (HOSPITAL_COMMUNITY): Payer: Medicare Other

## 2021-08-15 ENCOUNTER — Other Ambulatory Visit: Payer: Self-pay

## 2021-08-15 DIAGNOSIS — M62838 Other muscle spasm: Secondary | ICD-10-CM | POA: Diagnosis not present

## 2021-08-15 DIAGNOSIS — Z7982 Long term (current) use of aspirin: Secondary | ICD-10-CM | POA: Insufficient documentation

## 2021-08-15 DIAGNOSIS — R0789 Other chest pain: Secondary | ICD-10-CM | POA: Diagnosis not present

## 2021-08-15 DIAGNOSIS — R079 Chest pain, unspecified: Secondary | ICD-10-CM

## 2021-08-15 LAB — BASIC METABOLIC PANEL
Anion gap: 8 (ref 5–15)
BUN: 16 mg/dL (ref 6–20)
CO2: 28 mmol/L (ref 22–32)
Calcium: 9.9 mg/dL (ref 8.9–10.3)
Chloride: 103 mmol/L (ref 98–111)
Creatinine, Ser: 0.93 mg/dL (ref 0.61–1.24)
GFR, Estimated: >60 mL/min (ref >60)
Glucose, Bld: 121 mg/dL — ABNORMAL HIGH (ref 70–99)
Potassium: 4.2 mmol/L (ref 3.5–5.1)
Sodium: 139 mmol/L (ref 135–145)

## 2021-08-15 LAB — CBC
HCT: 48.5 % (ref 39.0–52.0)
Hemoglobin: 16.5 g/dL (ref 13.0–17.0)
MCH: 31.1 pg (ref 26.0–34.0)
MCHC: 34 g/dL (ref 30.0–36.0)
MCV: 91.5 fL (ref 80.0–100.0)
Platelets: 266 10*3/uL (ref 150–400)
RBC: 5.3 MIL/uL (ref 4.22–5.81)
RDW: 13.2 % (ref 11.5–15.5)
WBC: 7.7 10*3/uL (ref 4.0–10.5)
nRBC: 0 % (ref 0.0–0.2)

## 2021-08-15 LAB — URINALYSIS, ROUTINE W REFLEX MICROSCOPIC
Bilirubin Urine: NEGATIVE
Glucose, UA: NEGATIVE mg/dL
Hgb urine dipstick: NEGATIVE
Ketones, ur: NEGATIVE mg/dL
Leukocytes,Ua: NEGATIVE
Nitrite: NEGATIVE
Protein, ur: 30 mg/dL — AB
Specific Gravity, Urine: 1.023 (ref 1.005–1.030)
pH: 5 (ref 5.0–8.0)

## 2021-08-15 LAB — TROPONIN I (HIGH SENSITIVITY)
Troponin I (High Sensitivity): 4 ng/L (ref <18)
Troponin I (High Sensitivity): 4 ng/L (ref <18)

## 2021-08-15 MED ORDER — DIAZEPAM 5 MG/ML IJ SOLN
2.5000 mg | Freq: Once | INTRAMUSCULAR | Status: AC
  Administered 2021-08-15: 2.5 mg via INTRAVENOUS
  Filled 2021-08-15: qty 2

## 2021-08-15 MED ORDER — DIAZEPAM 2 MG PO TABS: 2.0000 mg | ORAL_TABLET | Freq: Two times a day (BID) | ORAL | 0 refills | Status: DC

## 2021-08-15 NOTE — ED Provider Notes (Signed)
[facility] DEPT Provider Note   CSN: [id number] Arrival date & time: 08/15/21  1931     History  Chief Complaint  Patient presents with   Chest Pain   Shoulder Pain   Spasms    Joshua [name] is a 61 y.o. male.  61 year old male with prior medical history as detailed below presents for evaluation.  Patient complains of muscle spasms in his left shoulder, left arm, left chest.  Patient reports prior history of muscle spasms.  Patient took medication at home for his spasm with some improvement.  Spasms returned and he came to the ED for evaluation.    Patient denies associated shortness of breath.  He denies fever.  He denies diaphoresis.  He denies associated nausea or vomiting.  Patient reports his symptoms are worse with movement of the left shoulder or left arm.  The history is provided by the patient and medical records.  Illness Location:  Muscle spasms of the left upper back, left shoulder, and left chest Severity:  Mild Onset quality:  Gradual Duration:  1 day Timing:  Intermittent Progression:  Waxing and waning Chronicity:  New     Home Medications Prior to Admission medications   Medication Sig Start Date End Date Taking? Authorizing Provider  Accu-Chek Softclix Lancets lancets Use as instructed 08/13/21   Charlott Rakes, MD  acetaminophen (TYLENOL) 500 MG tablet Take 500 mg by mouth every 6 (six) hours as needed for moderate pain or headache.    [provider]  albuterol (VENTOLIN HFA) 108 (90 Base) MCG/ACT inhaler Inhale 2 puffs into the lungs every 6 (six) hours as needed for wheezing or shortness of breath. 08/13/21   Newlin, Enobong, MD  amLODipine (NORVASC) 5 MG tablet TAKE 1 TABLET (5 MG TOTAL) BY MOUTH DAILY. TO LOWER BLOOD PRESSURE. STOP LOSARTAN 08/13/21   Charlott Rakes, MD  aspirin 81 MG chewable tablet Chew 81 mg by mouth daily.    [provider]  atorvastatin (LIPITOR) 20 MG tablet Take 1 tablet (20  mg total) by mouth daily. 08/13/21   Charlott Rakes, MD  Blood Glucose Monitoring Suppl (ACCU-CHEK GUIDE ME) w/Device KIT Use to check blood sugar once daily. 01/07/21   Charlott Rakes, MD  cetirizine (ZYRTEC) 10 MG tablet Take 1 tablet (10 mg total) by mouth daily. 07/27/21   Charlott Rakes, MD  clonazePAM (KLONOPIN) 0.5 MG tablet Take 1 tablet (0.5 mg total) by mouth daily. 07/08/21   [name], MD  docusate sodium (COLACE) 100 MG capsule Take 1 capsule (100 mg total) by mouth 2 (two) times daily. 01/09/21   Meredith Pel, MD  fluticasone (FLONASE) 50 MCG/ACT nasal spray Place 2 sprays into both nostrils daily. 07/27/21   Charlott Rakes, MD  glucose blood (ACCU-CHEK GUIDE) test strip Use to check blood sugar once daily. 08/13/21   Charlott Rakes, MD  Lacosamide (VIMPAT) 100 MG TABS Take 1 tablet (100 mg total) by mouth in the morning and at bedtime. 06/29/21   [name], MD  lamoTRIgine (LAMICTAL) 100 MG tablet Take 2 tablets (200 mg total) by mouth 2 (two) times daily. 08/12/21   [name], MD  metFORMIN (GLUCOPHAGE) 500 MG tablet Take 1 tablet (500 mg total) by mouth 2 (two) times daily. 08/13/21   Charlott Rakes, MD  Misc. Devices MISC Please provide BP cuff for patient 07/08/20   Argentina Donovan, PA-C  pantoprazole (PROTONIX) 20 MG tablet Take 1 tablet (20 mg total)  by mouth 2 (two) times daily before a meal. 08/13/21 08/13/22  Charlott Rakes, MD  tamsulosin (FLOMAX) 0.4 MG CAPS capsule Take 1 capsule (0.4 mg total) by mouth daily. 08/12/21   Billey Co, MD  VITAMIN A PO Take 1 capsule by mouth daily.    [provider]      Allergies    Metformin    Review of Systems   Review of Systems  All other systems reviewed and are negative.  Physical Exam Updated Vital Signs BP (!) 144/81    Pulse 80    Temp 98.3 F (36.8 C) (Oral)    Resp 20    Ht 5' 9" (1.753 m)    Wt 90.7 kg    SpO2 99%    BMI 29.53 kg/m  Physical Exam Vitals and nursing note  reviewed.  Constitutional:      General: He is not in acute distress.    Appearance: Normal appearance. He is well-developed.  HENT:     Head: Normocephalic and atraumatic.  Eyes:     Conjunctiva/sclera: Conjunctivae normal.     Pupils: Pupils are equal, round, and reactive to light.  Cardiovascular:     Rate and Rhythm: Normal rate and regular rhythm.     Heart sounds: Normal heart sounds.  Pulmonary:     Effort: Pulmonary effort is normal. No respiratory distress.     Breath sounds: Normal breath sounds.  Chest:     Chest wall: Tenderness present.     Comments: Mild tenderness to palpation overlying the left lateral chest wall and left upper back overlying the left scapula.  No overlying crepitus.  No edema.  No rash. Abdominal:     General: There is no distension.     Palpations: Abdomen is soft.     Tenderness: There is no abdominal tenderness.  Musculoskeletal:        General: No deformity. Normal range of motion.     Cervical back: Normal range of motion and neck supple.  Skin:    General: Skin is warm and dry.  Neurological:     General: No focal deficit present.     Mental Status: He is alert and oriented to person, place, and time.    ED Results / Procedures / Treatments   Labs (all labs ordered are listed, but only abnormal results are displayed) Labs Reviewed  BASIC METABOLIC PANEL - Abnormal; Notable for the following components:      Result Value   Glucose, Bld 121 (*)    All other components within normal limits  URINALYSIS, ROUTINE W REFLEX MICROSCOPIC - Abnormal; Notable for the following components:   Protein, ur 30 (*)    Bacteria, UA RARE (*)    All other components within normal limits  CBC  TROPONIN I (HIGH SENSITIVITY)  TROPONIN I (HIGH SENSITIVITY)    EKG None  Radiology DG Chest Port 1 View  Result Date: 08/15/2021 CLINICAL DATA:  Chest pain radiating to left shoulder since yesterday. EXAM: PORTABLE CHEST 1 VIEW COMPARISON:  05/22/2021  FINDINGS: The heart size and mediastinal contours are within normal limits. Aortic atherosclerotic calcification noted. Both lungs are clear. Right shoulder prosthesis again noted. IMPRESSION: No active disease. Electronically Signed   By: Marlaine Hind M.D.   On: 08/15/2021 20:06    Procedures Procedures    Medications Ordered in ED Medications  diazepam (VALIUM) injection 2.5 mg (2.5 mg Intravenous Given 08/15/21 2049)    ED Course/ Medical Decision Making/  A&P                           Medical Decision Making Amount and/or Complexity of Data Reviewed Labs: ordered. Radiology: ordered.  Risk Prescription drug management.    Medical Screen Complete  This patient presented to the ED with complaint of back pain, chest pain.  This complaint involves an extensive number of treatment options. The initial differential diagnosis includes, but is not limited to, spasm, ACS, metabolic abnormality, etc.  This presentation is: Acute, Chronic, Self-Limited, Previously Undiagnosed, Uncertain Prognosis, Complicated, Systemic Symptoms, and Threat to Life/Bodily Function  Patient is presenting with complaint of back discomfort.  Patient's describe symptoms are most consistent with muscular spasm.  Patient feels significantly improved with administration of Valium.  Screening labs are without significant abnormality.  EKG is without acute evidence acute ischemia.  Troponin x2 is minimal detectable troponin and no delta.  Patient does understand need for close follow-up.  Strict return precautions given and understood.     Additional history obtained:  External records from outside sources obtained and reviewed including prior ED visits and prior Inpatient records.    Lab Tests:  I ordered and personally interpreted labs.  The pertinent results include: CBC, BMP, troponin   Imaging Studies ordered:  I ordered imaging studies including chest I independently visualized and  interpreted obtained imaging which showed no acute disease I agree with the radiologist interpretation.   Cardiac Monitoring:  The patient was maintained on a cardiac monitor.  I personally viewed and interpreted the cardiac monitor which showed an underlying rhythm of: NSR   Medicines ordered:  I ordered medication including Valium for muscle spasm Reevaluation of the patient after these medicines showed that the patient: improved    Problem List / ED Course:  Back spasm   Reevaluation:  After the interventions noted above, I reevaluated the patient and found that they have: improved   Disposition:  After consideration of the diagnostic results and the patients response to treatment, I feel that the patent would benefit from close outpatient follow-up.          Final Clinical Impression(s) / ED Diagnoses Final diagnoses:  Chest pain    Rx / DC Orders ED Discharge Orders     None         Valarie Merino, MD 08/15/21 2318

## 2021-08-15 NOTE — ED Triage Notes (Signed)
Pt reports with chest pain, left shoulder sharp pains, and muscle spasms that started yesterday. Pt states that his left arm feels numb.

## 2021-08-15 NOTE — Discharge Instructions (Addendum)
Return for any problem.  If you use valium for muscle spasm, do not use other muscle relaxants as previously prescribed.

## 2021-08-16 ENCOUNTER — Ambulatory Visit: Payer: Medicaid Other | Admitting: Family Medicine

## 2021-08-16 NOTE — Telephone Encounter (Signed)
SCAT application faxed to [facility]

## 2021-08-17 ENCOUNTER — Telehealth: Payer: Self-pay

## 2021-08-17 NOTE — Telephone Encounter (Signed)
Transition Care Management Unsuccessful Follow-up Telephone Call  Date of discharge and from where:  08/16/2021-[facility]   Attempts:  1st Attempt  Reason for unsuccessful TCM follow-up call:  Left voice message

## 2021-08-18 NOTE — Telephone Encounter (Signed)
Transition Care Management Follow-up Telephone Call Date of discharge and from where: 08/16/2021-[facility]  How have you been since you were released from the hospital? Pt stated he is doing fine.  Any questions or concerns? No  Items Reviewed: Did the pt receive and understand the discharge instructions provided? Yes  Medications obtained and verified? Yes  Other? No  Any new allergies since your discharge? No  Dietary orders reviewed? No Do you have support at home? Yes   Home Care and Equipment/Supplies: Were home health services ordered? not applicable If so, what is the name of the agency? N/A  Has the agency set up a time to come to the patient's home? not applicable Were any new equipment or medical supplies ordered?  No What is the name of the medical supply agency? N/A Were you able to get the supplies/equipment? not applicable Do you have any questions related to the use of the equipment or supplies? No  Functional Questionnaire: (I = Independent and D = Dependent) ADLs: I  Bathing/Dressing- I  Meal Prep- I  Eating- I  Maintaining continence- I  Transferring/Ambulation- I  Managing Meds- I  Follow up appointments reviewed:  PCP Hospital f/u appt confirmed? No   Specialist Hospital f/u appt confirmed? No   Are transportation arrangements needed? No  If their condition worsens, is the pt aware to call PCP or go to the Emergency Dept.? Yes Was the patient provided with contact information for the PCP's office or ED? Yes Was to pt encouraged to call back with questions or concerns? Yes

## 2021-08-20 ENCOUNTER — Telehealth: Payer: Self-pay | Admitting: Orthopedic Surgery

## 2021-08-20 NOTE — Telephone Encounter (Signed)
I called pt and advised to call the office back need more infor advised to select option one for triage so that I can answer his call.

## 2021-08-20 NOTE — Telephone Encounter (Signed)
Tried calling patient. No answer. LMVM advising needed follow up appt for Dr [name]/Luke to see him

## 2021-08-20 NOTE — Telephone Encounter (Signed)
Patient called. Says something is wrong, he can barley put a short on. Would like someone to call him. [phone]

## 2021-08-22 ENCOUNTER — Other Ambulatory Visit: Payer: Self-pay

## 2021-08-22 ENCOUNTER — Emergency Department (HOSPITAL_COMMUNITY)
Admission: EM | Admit: 2021-08-22 | Discharge: 2021-08-22 | Disposition: A | Discharge status: Home / Self Care | Payer: Medicare Other | Attending: Emergency Medicine | Admitting: Emergency Medicine

## 2021-08-22 DIAGNOSIS — Z743 Need for continuous supervision: Secondary | ICD-10-CM | POA: Diagnosis not present

## 2021-08-22 DIAGNOSIS — R58 Hemorrhage, not elsewhere classified: Secondary | ICD-10-CM | POA: Diagnosis not present

## 2021-08-22 DIAGNOSIS — K644 Residual hemorrhoidal skin tags: Secondary | ICD-10-CM | POA: Insufficient documentation

## 2021-08-22 DIAGNOSIS — K649 Unspecified hemorrhoids: Secondary | ICD-10-CM

## 2021-08-22 DIAGNOSIS — R6889 Other general symptoms and signs: Secondary | ICD-10-CM | POA: Diagnosis not present

## 2021-08-22 DIAGNOSIS — K625 Hemorrhage of anus and rectum: Secondary | ICD-10-CM | POA: Diagnosis present

## 2021-08-22 DIAGNOSIS — I499 Cardiac arrhythmia, unspecified: Secondary | ICD-10-CM | POA: Diagnosis not present

## 2021-08-22 MED ORDER — HYDROCORTISONE (PERIANAL) 2.5 % EX CREA: 1.0000 "application " | TOPICAL_CREAM | Freq: Two times a day (BID) | CUTANEOUS | 0 refills | Status: DC

## 2021-08-22 NOTE — ED Triage Notes (Signed)
Pt arrived by EMS concerned for rectal bleed. Pt state that when washing himself today he noticed light red blood. Pt denies hx of previous GI bleed. Does have hx of hemorrhoid.   Pt it alert and oriented. Denies feeling light headed or dizzy.

## 2021-08-22 NOTE — ED Provider Notes (Signed)
[facility] DEPT Provider Note   CSN: [id number] Arrival date & time: 08/22/21  1649     History  Chief Complaint  Patient presents with   Rectal Bleeding    Joshua [name] is a 61 y.o. male.  61 year old male presents with bright red blood per rectum x1 day.  Patient states that he was having standard bath and noted blood dripping from his anus.  No associate abdominal pain.  No prior history of same.  Does not appear to have any blood thinner use according to old records.  Denies any pain.  Called EMS and was transported here      Home Medications Prior to Admission medications   Medication Sig Start Date End Date Taking? Authorizing Provider  Accu-Chek Softclix Lancets lancets Use as instructed 08/13/21   Charlott Rakes, MD  acetaminophen (TYLENOL) 500 MG tablet Take 500 mg by mouth every 6 (six) hours as needed for moderate pain or headache.    [provider]  albuterol (VENTOLIN HFA) 108 (90 Base) MCG/ACT inhaler Inhale 2 puffs into the lungs every 6 (six) hours as needed for wheezing or shortness of breath. 08/13/21   Newlin, Enobong, MD  amLODipine (NORVASC) 5 MG tablet TAKE 1 TABLET (5 MG TOTAL) BY MOUTH DAILY. TO LOWER BLOOD PRESSURE. STOP LOSARTAN 08/13/21   Charlott Rakes, MD  aspirin 81 MG chewable tablet Chew 81 mg by mouth daily.    [provider]  atorvastatin (LIPITOR) 20 MG tablet Take 1 tablet (20 mg total) by mouth daily. 08/13/21   Charlott Rakes, MD  Blood Glucose Monitoring Suppl (ACCU-CHEK GUIDE ME) w/Device KIT Use to check blood sugar once daily. 01/07/21   Charlott Rakes, MD  cetirizine (ZYRTEC) 10 MG tablet Take 1 tablet (10 mg total) by mouth daily. 07/27/21   Charlott Rakes, MD  clonazePAM (KLONOPIN) 0.5 MG tablet Take 1 tablet (0.5 mg total) by mouth daily. 07/08/21   [name], MD  diazepam (VALIUM) 2 MG tablet Take 1 tablet (2 mg total) by mouth 2 (two) times daily. 08/15/21   Valarie Merino,  MD  docusate sodium (COLACE) 100 MG capsule Take 1 capsule (100 mg total) by mouth 2 (two) times daily. Patient not taking: Reported on 08/15/2021 01/09/21   Meredith Pel, MD  fluticasone [facility]) 50 MCG/ACT nasal spray Place 2 sprays into both nostrils daily. Patient taking differently: Place 2 sprays into both nostrils daily as needed for allergies. 07/27/21   Charlott Rakes, MD  glucose blood (ACCU-CHEK GUIDE) test strip Use to check blood sugar once daily. 08/13/21   Charlott Rakes, MD  Lacosamide (VIMPAT) 100 MG TABS Take 1 tablet (100 mg total) by mouth in the morning and at bedtime. 06/29/21   [name], MD  lamoTRIgine (LAMICTAL) 100 MG tablet Take 2 tablets (200 mg total) by mouth 2 (two) times daily. 08/12/21   [name], MD  metFORMIN (GLUCOPHAGE) 500 MG tablet Take 1 tablet (500 mg total) by mouth 2 (two) times daily. 08/13/21   Charlott Rakes, MD  Misc. Devices MISC Please provide BP cuff for patient 07/08/20   Argentina Donovan, PA-C  pantoprazole (PROTONIX) 20 MG tablet Take 1 tablet (20 mg total) by mouth 2 (two) times daily before a meal. 08/13/21 08/13/22  Charlott Rakes, MD  tamsulosin (FLOMAX) 0.4 MG CAPS capsule Take 1 capsule (0.4 mg total) by mouth daily. 08/12/21   Billey Co, MD  VITAMIN A PO Take 1 capsule  by mouth daily.    [provider]      Allergies    Patient has no known allergies.    Review of Systems   Review of Systems  All other systems reviewed and are negative.  Physical Exam Updated Vital Signs BP (!) 142/84    Pulse 95    Temp 97.8 F (36.6 C) (Oral)    Resp 18    SpO2 99%  Physical Exam Vitals and nursing note reviewed.  Constitutional:      General: He is not in acute distress.    Appearance: Normal appearance. He is well-developed. He is not toxic-appearing.  HENT:     Head: Normocephalic and atraumatic.  Eyes:     General: Lids are normal.     Conjunctiva/sclera: Conjunctivae normal.     Pupils: Pupils  are equal, round, and reactive to light.  Neck:     Thyroid: No thyroid mass.     Trachea: No tracheal deviation.  Cardiovascular:     Rate and Rhythm: Normal rate and regular rhythm.     Heart sounds: Normal heart sounds. No murmur heard.   No gallop.  Pulmonary:     Effort: Pulmonary effort is normal. No respiratory distress.     Breath sounds: Normal breath sounds. No stridor. No decreased breath sounds, wheezing, rhonchi or rales.  Abdominal:     General: There is no distension.     Palpations: Abdomen is soft.     Tenderness: There is no abdominal tenderness. There is no rebound.  Genitourinary:    Rectum: External hemorrhoid present.     Comments: No blood noted on digital rectal exam.  External hemorrhoid noted which was nonbleeding Musculoskeletal:        General: No tenderness. Normal range of motion.     Cervical back: Normal range of motion and neck supple.  Skin:    General: Skin is warm and dry.     Findings: No abrasion or rash.  Neurological:     Mental Status: He is alert and oriented to person, place, and time. Mental status is at baseline.     GCS: GCS eye subscore is 4. GCS verbal subscore is 5. GCS motor subscore is 6.     Cranial Nerves: No cranial nerve deficit.     Sensory: No sensory deficit.     Motor: Motor function is intact.  Psychiatric:        Attention and Perception: Attention normal.        Speech: Speech normal.        Behavior: Behavior normal.    ED Results / Procedures / Treatments   Labs (all labs ordered are listed, but only abnormal results are displayed) Labs Reviewed - No data to display  EKG None  Radiology No results found.  Procedures Procedures    Medications Ordered in ED Medications - No data to display  ED Course/ Medical Decision Making/ A&P                           Medical Decision Making  Patient with evidence of external hemorrhoid on exam.  He has no signs of active bleeding.  Rectal exam did not show  any gross blood.  May have internal hemorrhoids or possible anal fissures.  Has had issues with constipation in the past.  Patiently placed on Anusol with cortisone and will follow-up with his GI doctor.  No indications for imaging or  blood work at this time.  Vital signs are stable        Final Clinical Impression(s) / ED Diagnoses Final diagnoses:  None    Rx / DC Orders ED Discharge Orders     None         Lacretia Leigh, MD 08/22/21 1723

## 2021-08-22 NOTE — Discharge Instructions (Addendum)
Continue taking your Colace.  Follow-up with your gastroenterologist

## 2021-08-23 ENCOUNTER — Telehealth: Payer: Self-pay | Admitting: *Deleted

## 2021-08-23 NOTE — Telephone Encounter (Signed)
Transition Care Management Unsuccessful Follow-up Telephone Call  Date of discharge and from where:  08/22/2021 - [facility]  Attempts:  1st Attempt  Reason for unsuccessful TCM follow-up call:  Left voice message

## 2021-08-24 NOTE — Telephone Encounter (Signed)
Transition Care Management Unsuccessful Follow-up Telephone Call  Date of discharge and from where:  08/22/2021 - [name]  Attempts:  2nd Attempt  Reason for unsuccessful TCM follow-up call:  Left voice message

## 2021-08-25 NOTE — Telephone Encounter (Signed)
Transition Care Management Unsuccessful Follow-up Telephone Call  Date of discharge and from where:  08/22/2021 - [name]  Attempts:  3rd Attempt  Reason for unsuccessful TCM follow-up call:  Left voice message

## 2021-08-28 ENCOUNTER — Encounter (HOSPITAL_COMMUNITY): Payer: Self-pay | Admitting: Emergency Medicine

## 2021-08-28 ENCOUNTER — Emergency Department (HOSPITAL_COMMUNITY)
Admission: EM | Admit: 2021-08-28 | Discharge: 2021-08-28 | Disposition: A | Discharge status: Home / Self Care | Payer: Medicare Other | Attending: Emergency Medicine | Admitting: Emergency Medicine

## 2021-08-28 ENCOUNTER — Other Ambulatory Visit: Payer: Self-pay

## 2021-08-28 DIAGNOSIS — Z9114 Patient's other noncompliance with medication regimen: Secondary | ICD-10-CM | POA: Diagnosis not present

## 2021-08-28 DIAGNOSIS — Z789 Other specified health status: Secondary | ICD-10-CM

## 2021-08-28 DIAGNOSIS — G40909 Epilepsy, unspecified, not intractable, without status epilepticus: Secondary | ICD-10-CM | POA: Insufficient documentation

## 2021-08-28 DIAGNOSIS — Z7982 Long term (current) use of aspirin: Secondary | ICD-10-CM | POA: Diagnosis not present

## 2021-08-28 DIAGNOSIS — R569 Unspecified convulsions: Secondary | ICD-10-CM

## 2021-08-28 LAB — CBC WITH DIFFERENTIAL/PLATELET
Abs Immature Granulocytes: 0.02 10*3/uL (ref 0.00–0.07)
Basophils Absolute: 0.1 10*3/uL (ref 0.0–0.1)
Basophils Relative: 1 %
Eosinophils Absolute: 0.1 10*3/uL (ref 0.0–0.5)
Eosinophils Relative: 2 %
HCT: 47.9 % (ref 39.0–52.0)
Hemoglobin: 16.1 g/dL (ref 13.0–17.0)
Immature Granulocytes: 0 %
Lymphocytes Relative: 33 %
Lymphs Abs: 2.3 10*3/uL (ref 0.7–4.0)
MCH: 30.8 pg (ref 26.0–34.0)
MCHC: 33.6 g/dL (ref 30.0–36.0)
MCV: 91.8 fL (ref 80.0–100.0)
Monocytes Absolute: 0.7 10*3/uL (ref 0.1–1.0)
Monocytes Relative: 10 %
Neutro Abs: 3.8 10*3/uL (ref 1.7–7.7)
Neutrophils Relative %: 54 %
Platelets: 249 10*3/uL (ref 150–400)
RBC: 5.22 MIL/uL (ref 4.22–5.81)
RDW: 12.8 % (ref 11.5–15.5)
WBC: 7.1 10*3/uL (ref 4.0–10.5)
nRBC: 0 % (ref 0.0–0.2)

## 2021-08-28 LAB — BASIC METABOLIC PANEL
Anion gap: 8 (ref 5–15)
BUN: 15 mg/dL (ref 6–20)
CO2: 28 mmol/L (ref 22–32)
Calcium: 9.7 mg/dL (ref 8.9–10.3)
Chloride: 104 mmol/L (ref 98–111)
Creatinine, Ser: 0.94 mg/dL (ref 0.61–1.24)
GFR, Estimated: >60 mL/min (ref >60)
Glucose, Bld: 98 mg/dL (ref 70–99)
Potassium: 4.2 mmol/L (ref 3.5–5.1)
Sodium: 140 mmol/L (ref 135–145)

## 2021-08-28 MED ORDER — CLONAZEPAM 0.5 MG PO TABS: 0.5000 mg | ORAL_TABLET | Freq: Two times a day (BID) | ORAL | 0 refills | Status: DC | PRN

## 2021-08-28 NOTE — ED Notes (Signed)
Pt verbalizes understanding of discharge instructions. Opportunity for questions and answers were provided. Pt discharged from the ED.   ?

## 2021-08-28 NOTE — ED Triage Notes (Signed)
History of seizures.  Reports he had 2 seizures yesterday and is now out of seizure medication.  Contacted his neurologist yesterday but hasn't heard back.  States he has auro now.

## 2021-08-28 NOTE — ED Provider Triage Note (Signed)
Emergency Medicine Provider Triage Evaluation Note  Joshua [name] , a 60 y.o. male  was evaluated in triage.  Pt complains of seizure. Patient notes he had 2 seizures yesterday. 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 He is currently on Lamictal and Vimpat which he has been compliant with.  Patient states he recently ran out of Klonopin 1 week ago due to confusion about how many he was suppose to take daily.  Patient states he typically has an aura prior to his seizures.  Patient notes he is able to lay himself down prior to seizure so denies any head injuries.  Review of Systems  Positive: seizure Negative: fever  Physical Exam  BP (!) 143/87    Pulse 91    Temp 98.4 F (36.9 C) (Oral)    Resp 18    SpO2 95%  Gen:   Awake, no distress   Resp:  Normal effort  MSK:   Moves extremities without difficulty  Other:    Medical Decision Making  Medically screening exam initiated at 3:36 PM.  Appropriate orders placed.  [facility] [name] was informed that the remainder of the evaluation will be completed by another provider, this initial triage assessment does not replace that evaluation, and the importance of remaining in the ED until their evaluation is complete.  Routine labs Lamictal level   Suzy Bouchard, [place] 08/28/21 1538

## 2021-08-28 NOTE — ED Provider Notes (Signed)
[facility] EMERGENCY DEPARTMENT Provider Note   CSN: [id number] Arrival date & time: 08/28/21  1520     History  Chief Complaint  Patient presents with   Seizures    Joshua [name] is a 61 y.o. male.  HPI 61 year old male reports that he has a known seizure disorder and is followed by Dr. Mickeal Skinner.   Patient reports that there was a misunderstanding and he is out of his Dependent.  He reports that he had 2 seizures in the past week.  Reports taking all of his other seizure medication.  Care discussed with Dr. Mickeal Skinner.  Dates that the Klonopin is mainly for anxiety.     Home Medications Prior to Admission medications   Medication Sig Start Date End Date Taking? Authorizing Provider  clonazePAM (KLONOPIN) 0.5 MG tablet Take 1 tablet (0.5 mg total) by mouth 2 (two) times daily as needed for anxiety. 08/28/21  Yes Pattricia Boss, MD  Accu-Chek Softclix Lancets lancets Use as instructed 08/13/21   Charlott Rakes, MD  acetaminophen (TYLENOL) 500 MG tablet Take 500 mg by mouth every 6 (six) hours as needed for moderate pain or headache.    [provider]  albuterol (VENTOLIN HFA) 108 (90 Base) MCG/ACT inhaler Inhale 2 puffs into the lungs every 6 (six) hours as needed for wheezing or shortness of breath. 08/13/21   Newlin, Enobong, MD  amLODipine (NORVASC) 5 MG tablet TAKE 1 TABLET (5 MG TOTAL) BY MOUTH DAILY. TO LOWER BLOOD PRESSURE. STOP LOSARTAN 08/13/21   Charlott Rakes, MD  aspirin 81 MG chewable tablet Chew 81 mg by mouth daily.    [provider]  atorvastatin (LIPITOR) 20 MG tablet Take 1 tablet (20 mg total) by mouth daily. 08/13/21   Charlott Rakes, MD  Blood Glucose Monitoring Suppl (ACCU-CHEK GUIDE ME) w/Device KIT Use to check blood sugar once daily. 01/07/21   Charlott Rakes, MD  cetirizine (ZYRTEC) 10 MG tablet Take 1 tablet (10 mg total) by mouth daily. 07/27/21   Charlott Rakes, MD  diazepam (VALIUM) 2 MG tablet Take 1 tablet (2 mg  total) by mouth 2 (two) times daily. 08/15/21   Valarie Merino, MD  docusate sodium (COLACE) 100 MG capsule Take 1 capsule (100 mg total) by mouth 2 (two) times daily. Patient not taking: Reported on 08/15/2021 01/09/21   Meredith Pel, MD  fluticasone [facility]) 50 MCG/ACT nasal spray Place 2 sprays into both nostrils daily. Patient taking differently: Place 2 sprays into both nostrils daily as needed for allergies. 07/27/21   Charlott Rakes, MD  glucose blood (ACCU-CHEK GUIDE) test strip Use to check blood sugar once daily. 08/13/21   Charlott Rakes, MD  hydrocortisone (ANUSOL-HC) 2.5 % rectal cream Place 1 application rectally 2 (two) times daily. 08/22/21   Lacretia Leigh, MD  Lacosamide (VIMPAT) 100 MG TABS Take 1 tablet (100 mg total) by mouth in the morning and at bedtime. 06/29/21   [name], MD  lamoTRIgine (LAMICTAL) 100 MG tablet Take 2 tablets (200 mg total) by mouth 2 (two) times daily. 08/12/21   [name], MD  metFORMIN (GLUCOPHAGE) 500 MG tablet Take 1 tablet (500 mg total) by mouth 2 (two) times daily. 08/13/21   Charlott Rakes, MD  Misc. Devices MISC Please provide BP cuff for patient 07/08/20   Argentina Donovan, PA-C  pantoprazole (PROTONIX) 20 MG tablet Take 1 tablet (20 mg total) by mouth 2 (two) times daily before a meal. 08/13/21 08/13/22  Newlin,  Enobong, MD  tamsulosin (FLOMAX) 0.4 MG CAPS capsule Take 1 capsule (0.4 mg total) by mouth daily. 08/12/21   Billey Co, MD  VITAMIN A PO Take 1 capsule by mouth daily.    [provider]      Allergies    Patient has no known allergies.    Review of Systems   Review of Systems  All other systems reviewed and are negative.  Physical Exam Updated Vital Signs BP 136/86    Pulse 79    Temp 98.4 F (36.9 C) (Oral)    Resp 18    SpO2 97%  Physical Exam Vitals and nursing note reviewed.  Constitutional:      Appearance: Normal appearance.  HENT:     Head: Normocephalic.     Right Ear:  External ear normal.     Left Ear: External ear normal.     Nose: Nose normal.     Mouth/Throat:     Pharynx: Oropharynx is clear.  Eyes:     Pupils: Pupils are equal, round, and reactive to light.  Cardiovascular:     Rate and Rhythm: Normal rate and regular rhythm.     Pulses: Normal pulses.  Pulmonary:     Effort: Pulmonary effort is normal.  Abdominal:     General: Abdomen is flat.     Palpations: Abdomen is soft.  Musculoskeletal:     Cervical back: Normal range of motion.  Skin:    General: Skin is warm and dry.  Neurological:     General: No focal deficit present.     Mental Status: He is alert and oriented to person, place, and time.     Cranial Nerves: No cranial nerve deficit.     Sensory: No sensory deficit.     Motor: No weakness.     Coordination: Coordination normal.  Psychiatric:        Mood and Affect: Mood normal.        Thought Content: Thought content normal.    ED Results / Procedures / Treatments   Labs (all labs ordered are listed, but only abnormal results are displayed) Labs Reviewed  CBC WITH DIFFERENTIAL/PLATELET  BASIC METABOLIC PANEL  LAMOTRIGINE LEVEL    EKG None  Radiology No results found.  Procedures Procedures    Medications Ordered in ED Medications - No data to display  ED Course/ Medical Decision Making/ A&P                           Medical Decision Making Patient care discussed with Dr. Mickeal Skinner.  He advises to give the patient a 2-week supply of his Klonopin.  In the interim, he will make an appointment and see the patient in follow-up.  Patient is advised to call Dr. Renda Rolls office Monday for follow-up.           Final Clinical Impression(s) / ED Diagnoses Final diagnoses:  Seizure ([place])  Seizure-like activity ([place])  Has run out of medications    Rx / DC Orders ED Discharge Orders          Ordered    clonazePAM (KLONOPIN) 0.5 MG tablet  2 times daily PRN        08/28/21 1642               Pattricia Boss, MD 08/28/21 1642

## 2021-08-30 ENCOUNTER — Telehealth: Payer: Self-pay

## 2021-08-30 LAB — LAMOTRIGINE LEVEL: Lamotrigine Lvl: 6.3 ug/mL (ref 2.0–20.0)

## 2021-08-30 NOTE — Telephone Encounter (Signed)
Transition Care Management Unsuccessful Follow-up Telephone Call  Date of discharge and from where:  08/28/2021-[facility]   Attempts:  1st Attempt  Reason for unsuccessful TCM follow-up call:  Left voice message

## 2021-08-31 ENCOUNTER — Other Ambulatory Visit: Payer: Self-pay | Admitting: Obstetrics and Gynecology

## 2021-08-31 ENCOUNTER — Inpatient Hospital Stay: Payer: Medicare Other | Admitting: Internal Medicine

## 2021-08-31 NOTE — Telephone Encounter (Signed)
Transition Care Management Unsuccessful Follow-up Telephone Call  Date of discharge and from where:  08/28/2021 from [facility]  Attempts:  2nd Attempt  Reason for unsuccessful TCM follow-up call:  Left voice message

## 2021-08-31 NOTE — Patient Instructions (Signed)
Hi Mr. Rabinovich, I am sorry I missed you today - as a part of your Medicaid benefit, you are eligible for care management and care coordination services at no cost or copay. I was unable to reach you by phone today but would be happy to help you with your health related needs. Please feel free to call me at [phone].  A member of the Managed Medicaid care management team will reach out to you again over the next 7 days.   Aida Raider RN, BSN [place]   [facility] - Managed Medicaid High Risk [phone].

## 2021-08-31 NOTE — Patient Outreach (Signed)
Care Coordination  08/31/2021  [name] Sep 13, 1960 [id number]   Medicaid Managed Care   Unsuccessful Outreach Note  08/31/2021 Name: Joshua [name] MRN: [id number] DOB: 1961/03/17  Referred by: Charlott Rakes, MD Reason for referral : High Risk Managed Medicaid (Chronic case management-initial-RN)   An unsuccessful telephone outreach was attempted today. The patient was referred to the case management team for assistance with care management and care coordination.   Follow Up Plan: The care management team will reach out to the patient again over the next 7 days.   Aida Raider RN, BSN [place]   [facility] - Managed Medicaid High Risk [phone].

## 2021-09-01 NOTE — Telephone Encounter (Signed)
Transition Care Management Unsuccessful Follow-up Telephone Call  Date of discharge and from where:  08/28/2021 [name]  Attempts:  3rd Attempt  Reason for unsuccessful TCM follow-up call:  Left voice message

## 2021-09-03 ENCOUNTER — Ambulatory Visit (INDEPENDENT_AMBULATORY_CARE_PROVIDER_SITE_OTHER): Payer: Medicare Other | Admitting: Orthopedic Surgery

## 2021-09-03 ENCOUNTER — Other Ambulatory Visit: Payer: Self-pay

## 2021-09-03 DIAGNOSIS — Z96611 Presence of right artificial shoulder joint: Secondary | ICD-10-CM | POA: Diagnosis not present

## 2021-09-03 MED ORDER — MELOXICAM 15 MG PO TABS: ORAL_TABLET | ORAL | 0 refills | Status: DC

## 2021-09-04 ENCOUNTER — Encounter: Payer: Self-pay | Admitting: Orthopedic Surgery

## 2021-09-04 NOTE — Progress Notes (Signed)
Office Visit Note   Patient: Joshua [name]           Date of Birth: Dec 06, 1960           MRN: [id number] Visit Date: 09/03/2021 Requested by: Charlott Rakes, MD [place],  [place] [address] PCP: Charlott Rakes, MD  Subjective: Chief Complaint  Patient presents with   Right Shoulder - Follow-up    HPI: Joshua [name] is a patient underwent right reverse shoulder replacement 01/08/2021.  He is having some shoulder pain which started about 4 months after surgery.  Not having any fevers or chills.  Has done water therapy.  Reports some cramping in the biceps region.              ROS: All systems reviewed are negative as they relate to the chief complaint within the history of present illness.  Patient denies  fevers or chills.   Assessment & Plan: Visit Diagnoses:  1. History of arthroplasty of right shoulder     Plan: Impression is right reverse shoulder replacement which looks good on radiograph.  Ultrasound examination demonstrates possible partial subscap attachment but overall the implant remains stable.  His range of motion on the right would suggest that something is still intact regarding the subscap.  Plan is Mobic with return office visit in 4 months with repeat evaluation at that time.  Follow-Up Instructions: Return in about 4 months (around 01/01/2022).   Orders:  No orders of the defined types were placed in this encounter.  Meds ordered this encounter  Medications   meloxicam (MOBIC) 15 MG tablet    Sig: 1 po q day prn pain    Dispense:  30 tablet    Refill:  0      Procedures: No procedures performed   Clinical Data: No additional findings.  Objective: Vital Signs: There were no vitals taken for this visit.  Physical Exam:   Constitutional: Patient appears well-developed HEENT:  Head: Normocephalic Eyes:EOM are normal Neck: Normal range of motion Cardiovascular: Normal rate Pulmonary/chest: Effort normal Neurologic: Patient is  alert Skin: Skin is warm Psychiatric: Patient has normal mood and affect   Ortho Exam: Ortho exam demonstrates passive range of motion on the right of 45/90/130.  On the left at 65/100/170.  Subscap strength on the right 4 out of 5.  Patient is able to get his hand behind his head.  Incision intact.  No lymphadenopathy.  No warmth around the shoulder region.  Ultrasound exam demonstrates some soft tissue attached on the lesser tuberosity consistent with at least partial subscap function.  Also his range of motion is not unlimited with external rotation and does have a firm endpoint at 45 degrees.  Specialty Comments:  No specialty comments available.  Imaging: No results found.   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[place]) 03/07/2019   Seizures ([place]) 01/31/2019   Meningioma, recurrent of brain ([place]) 01/31/2019   Meningioma ([place]) 01/03/2019   Diffuse idiopathic skeletal hyperostosis 06/05/2013   Past Medical History:  Diagnosis Date  Anxiety    Brain tumor ([place]) 02/20/2013   brain tumor removed in March 2014, Dr Donald Pore   Diffuse idiopathic skeletal hyperostosis 06/05/2013   Dizziness    Enlarged prostate    GERD (gastroesophageal reflux disease)    Headache(784.0)    scattered   High cholesterol    Hypertension    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[place]) 03/07/2019   Meningioma ([facility])    Meningioma, recurrent of brain ([place]) 01/31/2019   Neuropathy 12/05/2019   Pre-diabetes    Rotator cuff tear    right   Seizures ([place])    11/27/19  last sz 1 wk ago    Family History  Problem Relation Age of Onset   Hypertension Mother    Dementia Mother    Diabetes Mother    Hypertension Father    CAD Father    Diabetes Father    CAD Brother     Past Surgical History:  Procedure Laterality Date   APPLICATION OF CRANIAL NAVIGATION N/A 01/10/2019   Procedure: APPLICATION OF CRANIAL NAVIGATION;  Surgeon: Erline Levine, MD;  Location: [facility];  Service: Neurosurgery;  Laterality: N/A;   CATARACT EXTRACTION Right    CRANIOTOMY Right 11/06/2012   Procedure: CRANIOTOMY TUMOR EXCISION;  Surgeon: Erline Levine, MD;  Location: [facility];  Service: Neurosurgery;  Laterality: Right;  Right Parasagittal craniotomy for meningioma with Stealth   CRANIOTOMY Right 01/10/2019   Procedure: Right Parasagittal Craniotomy for Tumor;  Surgeon: Erline Levine, MD;  Location: [facility];  Service: Neurosurgery;  Laterality: Right;  Right parasagittal craniotomy for tumor   ESOPHAGOGASTRODUODENOSCOPY     JOINT REPLACEMENT Right 2018   right hip   REVERSE SHOULDER ARTHROPLASTY Right 01/08/2021   Procedure: RIGHT REVERSE SHOULDER ARTHROPLASTY;  Surgeon: Meredith Pel, MD;  Location: [facility];  Service: Orthopedics;  Laterality: Right;   right knee arthroscopy     SHOULDER ARTHROSCOPY Right 06/09/2020   Procedure: RIGHT SHOULDER ARTHROSCOPY AND DEBRIDEMENT;  Surgeon: Newt Minion, MD;  Location: [facility];  Service: Orthopedics;  Laterality: Right;   Social History   Occupational History   Not on file  Tobacco Use   Smoking status: Former    Types: Cigarettes    Quit date: 01/29/2019    Years since quitting: 2.6   Smokeless tobacco: Never   Tobacco comments:    weekend smoker  Vaping Use   Vaping Use: Never used  Substance and Sexual Activity   Alcohol use: Not Currently    Comment: social   Drug use: No   Sexual activity: Not Currently

## 2021-09-05 ENCOUNTER — Ambulatory Visit (HOSPITAL_BASED_OUTPATIENT_CLINIC_OR_DEPARTMENT_OTHER): Payer: Medicare Other | Attending: Family Medicine | Admitting: Internal Medicine

## 2021-09-05 ENCOUNTER — Other Ambulatory Visit: Payer: Self-pay

## 2021-09-05 DIAGNOSIS — G4733 Obstructive sleep apnea (adult) (pediatric): Secondary | ICD-10-CM | POA: Diagnosis not present

## 2021-09-05 DIAGNOSIS — I10 Essential (primary) hypertension: Secondary | ICD-10-CM | POA: Diagnosis not present

## 2021-09-05 DIAGNOSIS — G4730 Sleep apnea, unspecified: Secondary | ICD-10-CM | POA: Diagnosis present

## 2021-09-05 DIAGNOSIS — R29818 Other symptoms and signs involving the nervous system: Secondary | ICD-10-CM

## 2021-09-06 ENCOUNTER — Telehealth: Payer: Self-pay | Admitting: Family Medicine

## 2021-09-06 NOTE — Telephone Encounter (Signed)
.. °  Medicaid Managed Care   Unsuccessful Outreach Note  09/06/2021 Name: Joshua [name] MRN: [id number] DOB: [birth year]  Referred by: Charlott Rakes, MD Reason for referral : High Risk Managed Medicaid (I called the patient today to get him rescheduled for his initial call with the MM RNCM. I left my  name and number on his VM.)   A second unsuccessful telephone outreach was attempted today. The patient was referred to the case management team for assistance with care management and care coordination.   Follow Up Plan: The care management team will reach out to the patient again over the next 7 days.    [place]

## 2021-09-09 ENCOUNTER — Inpatient Hospital Stay: Payer: Medicare Other | Attending: Internal Medicine | Admitting: Internal Medicine

## 2021-09-10 ENCOUNTER — Ambulatory Visit: Payer: Medicare Other | Admitting: Surgical

## 2021-09-11 DIAGNOSIS — R0683 Snoring: Secondary | ICD-10-CM

## 2021-09-11 DIAGNOSIS — R29818 Other symptoms and signs involving the nervous system: Secondary | ICD-10-CM | POA: Diagnosis not present

## 2021-09-11 NOTE — Procedures (Signed)
° ° ° °  Patient Name: Joshua [name], Joshua [name] Date: 09/05/2021 Gender: Male D.O.B: 17-May-1961 Age (years): 10 Referring Provider: Arnoldo Morale Height (inches): 69 Interpreting Physician: Baird Lyons MD, ABSM Weight (lbs): 180 RPSGT: Gwenyth Allegra BMI: 27 MRN: [id number] Neck Size: 16.00  CLINICAL INFORMATION Sleep Study Type: NPSG Indication for sleep study: Hypertension Epworth Sleepiness Score: 4  SLEEP STUDY TECHNIQUE As per the AASM Manual for the Scoring of Sleep and Associated Events v2.3 (April 2016) with a hypopnea requiring 4% desaturations.  The channels recorded and monitored were frontal, central and occipital EEG, electrooculogram (EOG), submentalis EMG (chin), nasal and oral airflow, thoracic and abdominal wall motion, anterior tibialis EMG, snore microphone, electrocardiogram, and pulse oximetry.  MEDICATIONS Medications self-administered by patient taken the night of the study : none reported  SLEEP ARCHITECTURE The study was initiated at 11:16:39 PM and ended at 5:20:13 AM.  Sleep onset time was 12.4 minutes and the sleep efficiency was 65.9%%. The total sleep time was 239.5 minutes.  Stage REM latency was 72.5 minutes.  The patient spent 12.9%% of the night in stage N1 sleep, 65.6%% in stage N2 sleep, 0.0%% in stage N3 and 21.5% in REM.  Alpha intrusion was absent.  Supine sleep was 40.72%.  RESPIRATORY PARAMETERS The overall apnea/hypopnea index (AHI) was 2.3 per hour. There were 0 total apneas, including 0 obstructive, 0 central and 0 mixed apneas. There were 9 hypopneas and 9 RERAs.  The AHI during Stage REM sleep was 4.7 per hour.  AHI while supine was 3.1 per hour.  The mean oxygen saturation was 93.9%. The minimum SpO2 during sleep was 86.0%.  soft snoring was noted during this study.  CARDIAC DATA The 2 lead EKG demonstrated sinus rhythm. The mean heart rate was 58.0 beats per minute. Other EKG findings include: None.  LEG MOVEMENT  DATA The total PLMS were 0 with a resulting PLMS index of 0.0. Associated arousal with leg movement index was 0.0 .  IMPRESSIONS - No significant obstructive sleep apnea occurred during this study (AHI = 2.3/h). - Mild oxygen desaturation was noted during this study (Min O2 = 86.0%). Mean O2 saturation 93.9%. - The patient snored with soft snoring volume. - No cardiac abnormalities were noted during this study. - Clinically significant periodic limb movements did not occur during sleep. No significant associated arousals.  DIAGNOSIS - Primary Snoring  RECOMMENDATIONS - Manage for snoring and symptoms based on clinical judgment. - Be careful with alcohol, sedatives and other CNS depressants that may worsen sleep apnea and disrupt normal sleep architecture. - Sleep hygiene should be reviewed to assess factors that may improve sleep quality. - Weight management and regular exercise should be initiated or continued if appropriate.  [Electronically signed] 09/11/2021 11:04 AM  Baird Lyons MD, ABSM Diplomate, American Board of Sleep Medicine   NPI: [id number]                        [place] [place] [facility]  ELECTRONICALLY SIGNED ON:  09/11/2021, 11:02 AM [place] PH: [phone]   FX: [phone] [place]

## 2021-09-13 ENCOUNTER — Ambulatory Visit: Payer: Self-pay | Admitting: *Deleted

## 2021-09-13 NOTE — Telephone Encounter (Signed)
° ° °  Chief Complaint: sensation that there is something in throat Symptoms: feels like throat is blocked/swollen at times Frequency: 4 months  Pertinent Negatives: Patient denies pain, trouble breathing, no trouble eating.drinking Disposition: [] ED /[x] Urgent Care (no appt availability in office) / [] Appointment(In office/virtual)/ []  [facility]/ [] Home Care/ [] Refused Recommended Disposition /[x]  Mobile Bus/ []  Follow-up with PCP Additional Notes: No appointment in office- advised UC for evaluation    Reason for Disposition  Difficulty swallowing is a chronic symptom (recurrent or ongoing AND present > 4 weeks)  Answer Assessment - Initial Assessment Questions 1. SYMPTOM: "What's the main symptom you're concerned about?" (e.g., chapped lips, dry mouth, lump, sores)     Trouble swallowing- feels throat is blocked at time 2. ONSET: "When did the  swallowing trouble start  start?"     Ongoing 4 months 3. PAIN: "Is there any pain?" If Yes, ask: "How bad is it?" (Scale: 1-10; mild, moderate, severe)   - MILD (1-3):  doesn't interfere with eating or normal activities   - MODERATE (4-7): interferes with eating some solids and normal activities   - SEVERE (8-10):  excruciating pain, interferes with most normal activities   - SEVERE DYSPHAGIA: can't swallow liquids, drooling     no 4. CAUSE: "What do you think is causing the symptoms?"     Reflux- treated 5. OTHER SYMPTOMS: "Do you have any other symptoms?" (e.g., fever, sore throat, toothache, swelling)     No trouble swallowing 6. PREGNANCY: "Is there any chance you are pregnant?" "When was your last menstrual period?"     *No Answer*  Answer Assessment - Initial Assessment Questions 1. SYMPTOM: "Are you having difficulty swallowing liquids, solids, or both?"     no 2. ONSET: "When did the swallowing problems begin?"      4 months 3. CAUSE: "What do you think is causing the problem?"      unsure 4.  CHRONIC/RECURRENT: "Is this a new problem for you?"  If no, ask: "How long have you had this problem?" (e.g., days, weeks, months)      chronic 5. OTHER SYMPTOMS: "Do you have any other symptoms?" (e.g., difficulty breathing, sore throat, swollen tongue, chest pain)     Sensation of swelling in throat- no pain, no trouble breathing, no trouble eating/drinking 6. PREGNANCY: "Is there any chance you are pregnant?" "When was your last menstrual period?"     *No Answer*  Protocols used: Mouth Symptoms-A-AH, Swallowing Difficulty-A-AH

## 2021-09-14 ENCOUNTER — Ambulatory Visit: Payer: Medicare Other | Admitting: Physician Assistant

## 2021-09-14 ENCOUNTER — Encounter (HOSPITAL_COMMUNITY): Payer: Self-pay

## 2021-09-14 ENCOUNTER — Other Ambulatory Visit: Payer: Self-pay

## 2021-09-14 ENCOUNTER — Emergency Department (HOSPITAL_COMMUNITY): Payer: Medicare Other

## 2021-09-14 ENCOUNTER — Emergency Department (HOSPITAL_COMMUNITY)
Admission: EM | Admit: 2021-09-14 | Discharge: 2021-09-14 | Disposition: A | Discharge status: Home / Self Care | Payer: Medicare Other | Attending: Emergency Medicine | Admitting: Emergency Medicine

## 2021-09-14 DIAGNOSIS — Z7982 Long term (current) use of aspirin: Secondary | ICD-10-CM | POA: Diagnosis not present

## 2021-09-14 DIAGNOSIS — I10 Essential (primary) hypertension: Secondary | ICD-10-CM | POA: Insufficient documentation

## 2021-09-14 DIAGNOSIS — R42 Dizziness and giddiness: Secondary | ICD-10-CM | POA: Diagnosis present

## 2021-09-14 DIAGNOSIS — Z79899 Other long term (current) drug therapy: Secondary | ICD-10-CM | POA: Insufficient documentation

## 2021-09-14 DIAGNOSIS — R110 Nausea: Secondary | ICD-10-CM | POA: Diagnosis not present

## 2021-09-14 DIAGNOSIS — H9209 Otalgia, unspecified ear: Secondary | ICD-10-CM | POA: Insufficient documentation

## 2021-09-14 DIAGNOSIS — Z20822 Contact with and (suspected) exposure to covid-19: Secondary | ICD-10-CM | POA: Diagnosis not present

## 2021-09-14 DIAGNOSIS — Z7984 Long term (current) use of oral hypoglycemic drugs: Secondary | ICD-10-CM | POA: Insufficient documentation

## 2021-09-14 LAB — URINALYSIS, ROUTINE W REFLEX MICROSCOPIC
Bilirubin Urine: NEGATIVE
Glucose, UA: NEGATIVE mg/dL
Hgb urine dipstick: NEGATIVE
Ketones, ur: NEGATIVE mg/dL
Leukocytes,Ua: NEGATIVE
Nitrite: NEGATIVE
Protein, ur: NEGATIVE mg/dL
Specific Gravity, Urine: 1.012 (ref 1.005–1.030)
pH: 6 (ref 5.0–8.0)

## 2021-09-14 LAB — DIFFERENTIAL
Abs Immature Granulocytes: 0.07 10*3/uL (ref 0.00–0.07)
Basophils Absolute: 0 10*3/uL (ref 0.0–0.1)
Basophils Relative: 1 %
Eosinophils Absolute: 0.2 10*3/uL (ref 0.0–0.5)
Eosinophils Relative: 2 %
Immature Granulocytes: 1 %
Lymphocytes Relative: 35 %
Lymphs Abs: 2.5 10*3/uL (ref 0.7–4.0)
Monocytes Absolute: 0.8 10*3/uL (ref 0.1–1.0)
Monocytes Relative: 11 %
Neutro Abs: 3.5 10*3/uL (ref 1.7–7.7)
Neutrophils Relative %: 50 %

## 2021-09-14 LAB — I-STAT CHEM 8, ED
BUN: 13 mg/dL (ref 6–20)
Calcium, Ion: 1.24 mmol/L (ref 1.15–1.40)
Chloride: 103 mmol/L (ref 98–111)
Creatinine, Ser: 0.9 mg/dL (ref 0.61–1.24)
Glucose, Bld: 97 mg/dL (ref 70–99)
HCT: 45 % (ref 39.0–52.0)
Hemoglobin: 15.3 g/dL (ref 13.0–17.0)
Potassium: 4.2 mmol/L (ref 3.5–5.1)
Sodium: 142 mmol/L (ref 135–145)
TCO2: 29 mmol/L (ref 22–32)

## 2021-09-14 LAB — APTT: aPTT: 30 seconds (ref 24–36)

## 2021-09-14 LAB — RAPID URINE DRUG SCREEN, HOSP PERFORMED
Amphetamines: NOT DETECTED
Barbiturates: NOT DETECTED
Benzodiazepines: NOT DETECTED
Cocaine: NOT DETECTED
Opiates: NOT DETECTED
Tetrahydrocannabinol: NOT DETECTED

## 2021-09-14 LAB — RESP PANEL BY RT-PCR (FLU A&B, COVID) ARPGX2
Influenza A by PCR: NEGATIVE
Influenza B by PCR: NEGATIVE
SARS Coronavirus 2 by RT PCR: NEGATIVE

## 2021-09-14 LAB — ETHANOL: Alcohol, Ethyl (B): <10 mg/dL (ref <10)

## 2021-09-14 LAB — COMPREHENSIVE METABOLIC PANEL
ALT: 25 U/L (ref 0–44)
AST: 18 U/L (ref 15–41)
Albumin: 4.5 g/dL (ref 3.5–5.0)
Alkaline Phosphatase: 64 U/L (ref 38–126)
Anion gap: 5 (ref 5–15)
BUN: 14 mg/dL (ref 6–20)
CO2: 27 mmol/L (ref 22–32)
Calcium: 9.1 mg/dL (ref 8.9–10.3)
Chloride: 106 mmol/L (ref 98–111)
Creatinine, Ser: 0.98 mg/dL (ref 0.61–1.24)
GFR, Estimated: >60 mL/min (ref >60)
Glucose, Bld: 100 mg/dL — ABNORMAL HIGH (ref 70–99)
Potassium: 4.1 mmol/L (ref 3.5–5.1)
Sodium: 138 mmol/L (ref 135–145)
Total Bilirubin: 0.5 mg/dL (ref 0.3–1.2)
Total Protein: 7.1 g/dL (ref 6.5–8.1)

## 2021-09-14 LAB — CBC
HCT: 44.2 % (ref 39.0–52.0)
Hemoglobin: 15 g/dL (ref 13.0–17.0)
MCH: 30.9 pg (ref 26.0–34.0)
MCHC: 33.9 g/dL (ref 30.0–36.0)
MCV: 91.1 fL (ref 80.0–100.0)
Platelets: 237 10*3/uL (ref 150–400)
RBC: 4.85 MIL/uL (ref 4.22–5.81)
RDW: 12.8 % (ref 11.5–15.5)
WBC: 7 10*3/uL (ref 4.0–10.5)
nRBC: 0 % (ref 0.0–0.2)

## 2021-09-14 LAB — PROTIME-INR
INR: 1 (ref 0.8–1.2)
Prothrombin Time: 13.6 seconds (ref 11.4–15.2)

## 2021-09-14 MED ORDER — DIPHENHYDRAMINE HCL 50 MG/ML IJ SOLN
12.5000 mg | Freq: Once | INTRAMUSCULAR | Status: AC
  Administered 2021-09-14: 12.5 mg via INTRAVENOUS
  Filled 2021-09-14: qty 1

## 2021-09-14 MED ORDER — PROCHLORPERAZINE EDISYLATE 10 MG/2ML IJ SOLN
10.0000 mg | Freq: Once | INTRAMUSCULAR | Status: AC
  Administered 2021-09-14: 10 mg via INTRAVENOUS
  Filled 2021-09-14: qty 2

## 2021-09-14 MED ORDER — MECLIZINE HCL 25 MG PO TABS
25.0000 mg | ORAL_TABLET | Freq: Once | ORAL | Status: AC
  Administered 2021-09-14: 25 mg via ORAL
  Filled 2021-09-14: qty 1

## 2021-09-14 MED ORDER — DEXAMETHASONE SODIUM PHOSPHATE 10 MG/ML IJ SOLN
10.0000 mg | Freq: Once | INTRAMUSCULAR | Status: AC
  Administered 2021-09-14: 10 mg via INTRAVENOUS
  Filled 2021-09-14: qty 1

## 2021-09-14 NOTE — ED Provider Triage Note (Signed)
Emergency Medicine Provider Triage Evaluation Note  Joshua [name] , a 61 y.o. male  was evaluated in triage.  Pt complains of dizziness and feeling off.  When he went to bed at 1 AM he felt completely normal.  He denies any headache or chest pain.  He states that he has not had any fevers.  He did have a focal seizure today which was his baseline and usual for him.  He denies any new weakness, states he has residual weakness in his left leg from prior brain surgery.  He reports compliance with all of his medications.  Last known well 0100 today.    Physical Exam  BP (!) 148/78 (BP Location: Right Arm)    Pulse 72    Temp 98.2 F (36.8 C) (Oral)    Resp (!) 21    Ht 5\' 9"  (1.753 m)    Wt 81.6 kg    SpO2 98%    BMI 26.58 kg/m  Gen:   Awake, no distress   Resp:  Normal effort  MSK:   Baseline weakness in left leg per patient Other:  No nystagmus noted, normal finger-nose-finger bilaterally.  Speech is not slurred.  Medical Decision Making  Medically screening exam initiated at 3:53 PM.  Appropriate orders placed.  Joshua [name] was informed that the remainder of the evaluation will be completed by another provider, this initial triage assessment does not replace that evaluation, and the importance of remaining in the ED until their evaluation is complete.  Patient presents today for evaluation of dizziness that he first noticed at about 6 AM when he woke up.  His last known well was 1 AM this morning. He does not currently meet LVO criteria.  And he is outside the tPA window therefore code stroke was not called. We will obtain labs, initial CT, anticipating he may need additional imaging.   Lorin Glass, [place] 09/14/21 1603

## 2021-09-14 NOTE — Telephone Encounter (Signed)
Scheduled appt for patient at [facility] today at 2pm. Patient aware.

## 2021-09-14 NOTE — Discharge Instructions (Signed)
Return to ED with any new symptoms such as weakness, slurred speech Please follow-up with your PCP this week regarding your dizziness. Please continue taking all your home medications Please read the attached informational guide concerning dizziness I provided

## 2021-09-14 NOTE — ED Provider Notes (Signed)
[facility] DEPT Provider Note   CSN: [id number] Arrival date & time: 09/14/21  1541     History  Chief Complaint  Patient presents with   Dizziness    Joshua [name] is a 61 y.o. male with medical history significant for meningioma, anxiety, dizziness, GERD, headaches, hypertension.  Patient presents ED for evaluation of dizziness that he began experiencing this morning at 630.  Patient reports sensation of dizziness is equivalent to that of feeling off balance.  Patient reports this morning at 6:30 AM he woke up feeling dizzy.  Patient states the sensation is constant and he feels off balance.  Patient reports that the dizziness is worsened with standing and alleviated by sitting.  Patient states that he has began water therapy/aerobics recently and he is continuously dunking his head underneath the water.  Patient endorsing dizziness, ear pain, nausea.  Patient denies weakness, chest pain, shortness of breath, vomiting, diarrhea, fevers.   Dizziness Associated symptoms: nausea   Associated symptoms: no chest pain, no diarrhea, no shortness of breath, no vomiting and no weakness       Home Medications Prior to Admission medications   Medication Sig Start Date End Date Taking? Authorizing Provider  Accu-Chek Softclix Lancets lancets Use as instructed 08/13/21   Charlott Rakes, MD  acetaminophen (TYLENOL) 500 MG tablet Take 500 mg by mouth every 6 (six) hours as needed for moderate pain or headache.    [provider]  albuterol (VENTOLIN HFA) 108 (90 Base) MCG/ACT inhaler Inhale 2 puffs into the lungs every 6 (six) hours as needed for wheezing or shortness of breath. 08/13/21   Newlin, Enobong, MD  amLODipine (NORVASC) 5 MG tablet TAKE 1 TABLET (5 MG TOTAL) BY MOUTH DAILY. TO LOWER BLOOD PRESSURE. STOP LOSARTAN 08/13/21   Charlott Rakes, MD  aspirin 81 MG chewable tablet Chew 81 mg by mouth daily.    [provider]  atorvastatin  (LIPITOR) 20 MG tablet Take 1 tablet (20 mg total) by mouth daily. 08/13/21   Charlott Rakes, MD  Blood Glucose Monitoring Suppl (ACCU-CHEK GUIDE ME) w/Device KIT Use to check blood sugar once daily. 01/07/21   Charlott Rakes, MD  cetirizine (ZYRTEC) 10 MG tablet Take 1 tablet (10 mg total) by mouth daily. 07/27/21   Charlott Rakes, MD  clonazePAM (KLONOPIN) 0.5 MG tablet Take 1 tablet (0.5 mg total) by mouth 2 (two) times daily as needed for anxiety. 08/28/21   Pattricia Boss, MD  diazepam (VALIUM) 2 MG tablet Take 1 tablet (2 mg total) by mouth 2 (two) times daily. 08/15/21   Valarie Merino, MD  docusate sodium (COLACE) 100 MG capsule Take 1 capsule (100 mg total) by mouth 2 (two) times daily. Patient not taking: Reported on 08/15/2021 01/09/21   Meredith Pel, MD  fluticasone [facility]) 50 MCG/ACT nasal spray Place 2 sprays into both nostrils daily. Patient taking differently: Place 2 sprays into both nostrils daily as needed for allergies. 07/27/21   Charlott Rakes, MD  glucose blood (ACCU-CHEK GUIDE) test strip Use to check blood sugar once daily. 08/13/21   Charlott Rakes, MD  hydrocortisone (ANUSOL-HC) 2.5 % rectal cream Place 1 application rectally 2 (two) times daily. 08/22/21   Lacretia Leigh, MD  Lacosamide (VIMPAT) 100 MG TABS Take 1 tablet (100 mg total) by mouth in the morning and at bedtime. 06/29/21   [name], MD  lamoTRIgine (LAMICTAL) 100 MG tablet Take 2 tablets (200 mg total) by mouth 2 (two)  times daily. 08/12/21   [name], MD  meloxicam (MOBIC) 15 MG tablet 1 po q day prn pain 09/03/21   Meredith Pel, MD  metFORMIN (GLUCOPHAGE) 500 MG tablet Take 1 tablet (500 mg total) by mouth 2 (two) times daily. 08/13/21   Charlott Rakes, MD  Misc. Devices MISC Please provide BP cuff for patient 07/08/20   Argentina Donovan, PA-C  pantoprazole (PROTONIX) 20 MG tablet Take 1 tablet (20 mg total) by mouth 2 (two) times daily before a meal. 08/13/21 08/13/22  Charlott Rakes, MD  tamsulosin (FLOMAX) 0.4 MG CAPS capsule Take 1 capsule (0.4 mg total) by mouth daily. 08/12/21   Billey Co, MD  VITAMIN A PO Take 1 capsule by mouth daily.    [provider]      Allergies    Patient has no known allergies.    Review of Systems   Review of Systems  Constitutional:  Negative for fever.  HENT:  Positive for ear pain.   Respiratory:  Negative for shortness of breath.   Cardiovascular:  Negative for chest pain.  Gastrointestinal:  Positive for nausea. Negative for diarrhea and vomiting.  Neurological:  Positive for dizziness. Negative for weakness.   Physical Exam Updated Vital Signs BP (!) 123/91    Pulse 64    Temp 98.2 F (36.8 C) (Oral)    Resp 18    Ht 5' 9" (1.753 m)    Wt 81.6 kg    SpO2 99%    BMI 26.58 kg/m  Physical Exam Vitals and nursing note reviewed.  Constitutional:      General: He is not in acute distress.    Appearance: He is not ill-appearing, toxic-appearing or diaphoretic.  HENT:     Head: Normocephalic and atraumatic.     Nose: Nose normal. No congestion.     Mouth/Throat:     Mouth: Mucous membranes are moist.  Eyes:     Extraocular Movements: Extraocular movements intact.     Conjunctiva/sclera: Conjunctivae normal.     Pupils: Pupils are equal, round, and reactive to light.  Cardiovascular:     Rate and Rhythm: Normal rate and regular rhythm.  Pulmonary:     Effort: Pulmonary effort is normal.     Breath sounds: Normal breath sounds. No wheezing.  Abdominal:     General: Abdomen is flat.     Palpations: Abdomen is soft.     Tenderness: There is no abdominal tenderness.  Musculoskeletal:     Cervical back: Normal range of motion and neck supple. No tenderness.  Skin:    General: Skin is warm and dry.     Capillary Refill: Capillary refill takes less than 2 seconds.  Neurological:     Mental Status: He is alert and oriented to person, place, and time.     GCS: GCS eye subscore is 4. GCS verbal  subscore is 5. GCS motor subscore is 6.     Cranial Nerves: Cranial nerves 2-12 are intact. No cranial nerve deficit or dysarthria.     Sensory: Sensation is intact. No sensory deficit.     Motor: Motor function is intact. No weakness.     Coordination: Coordination is intact. Finger-Nose-Finger Test and Heel to [facility] Test normal.    ED Results / Procedures / Treatments   Labs (all labs ordered are listed, but only abnormal results are displayed) Labs Reviewed  COMPREHENSIVE METABOLIC PANEL - Abnormal; Notable for the following components:  Result Value   Glucose, Bld 100 (*)    All other components within normal limits  URINALYSIS, ROUTINE W REFLEX MICROSCOPIC - Abnormal; Notable for the following components:   Color, Urine STRAW (*)    All other components within normal limits  RESP PANEL BY RT-PCR (FLU A&B, COVID) ARPGX2  ETHANOL  PROTIME-INR  APTT  CBC  DIFFERENTIAL  RAPID URINE DRUG SCREEN, HOSP PERFORMED  I-STAT CHEM 8, ED    EKG EKG Interpretation  Date/Time:  Tuesday September 14 2021 15:47:55 EST Ventricular Rate:  72 PR Interval:  151 QRS Duration: 107 QT Interval:  382 QTC Calculation: 418 R Axis:   126 Text Interpretation: Sinus rhythm Consider right ventricular hypertrophy No significant change since last tracing Confirmed by Deno Etienne [phone]) on 09/14/2021 5:15:10 PM  Radiology CT HEAD WO CONTRAST  Result Date: 09/14/2021 CLINICAL DATA:  Dizziness since wakening this morning EXAM: CT HEAD WITHOUT CONTRAST TECHNIQUE: Contiguous axial images were obtained from the base of the skull through the vertex without intravenous contrast. RADIATION DOSE REDUCTION: This exam was performed according to the departmental dose-optimization program which includes automated exposure control, adjustment of the mA and/or kV according to patient size and/or use of iterative reconstruction technique. COMPARISON:  08/05/2021 FINDINGS: Brain: Encephalomalacia at the frontal  convexities related to prior craniotomy again noted. Confluent hypodensities throughout the periventricular white matter consistent with chronic small vessel ischemic changes, stable. No evidence of acute infarct or hemorrhage. Lateral ventricles and midline structures are unremarkable. No acute extra-axial fluid collections. No mass effect. Vascular: No hyperdense vessel or unexpected calcification. Skull: Stable postsurgical changes from craniotomy at the frontal convexities. No acute bony abnormality. Sinuses/Orbits: No acute finding. Other: None. IMPRESSION: 1. No acute intracranial process. 2. Stable postsurgical changes and chronic small vessel ischemic changes as above. Electronically Signed   By: Randa Ngo M.D.   On: 09/14/2021 16:33   MR BRAIN WO CONTRAST  Result Date: 09/14/2021 CLINICAL DATA:  Dizziness.  Acute neurologic deficit. EXAM: MRI HEAD WITHOUT CONTRAST TECHNIQUE: Multiplanar, multiecho pulse sequences of the brain and surrounding structures were obtained without intravenous contrast. COMPARISON:  03/26/2021 FINDINGS: Brain: No acute infarct, mass effect or extra-axial collection. No acute or chronic hemorrhage. Hyperintense T2-weighted signal is moderately widespread throughout the white matter. Generalized volume loss without a clear lobar predilection. There is a gliosis of the para falcine frontal and parietal lobes at the meningioma treatment site, unchanged. 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 Skull and upper cervical spine: Remote biparietal craniotomy Sinuses/Orbits:No paranasal sinus fluid levels or advanced mucosal thickening. No mastoid or middle ear effusion. Normal orbits. IMPRESSION: 1. No acute intracranial abnormality. 2. 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 3. 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 Electronically Signed   By: Ulyses Jarred M.D.   On: 09/14/2021 19:19   SLEEP STUDY DOCUMENTS  Result Date: 09/13/2021 Ordered by an unspecified provider.   Procedures Procedures    Medications Ordered in ED Medications  prochlorperazine (COMPAZINE) injection 10 mg (10 mg Intravenous Given 09/14/21 1829)  diphenhydrAMINE (BENADRYL) injection 12.5 mg (12.5 mg Intravenous Given 09/14/21 1828)  dexamethasone (DECADRON) injection 10 mg (10 mg Intravenous Given 09/14/21 1830)  meclizine (ANTIVERT) tablet 25 mg (25 mg Oral Given 09/14/21 1832)    ED Course/ Medical Decision Making/ A&P  Medical Decision Making Amount and/or Complexity of Data Reviewed Radiology: ordered.  Risk Prescription drug management.   61 year old male with history of dizziness presents to ED for evaluation of dizziness.  Patient has history of meningioma requiring removal.  On examination, the patient is nontachycardic, not hypoxic, afebrile, no focal neurodeficits, clear lung sounds, soft compressible abdomen.  Patient is able to follow commands, he is alert and oriented x4.  Patient neurological examination does not show any focal neurodeficits.  Patient worked up utilizing following labs and imaging studies: - I-STAT Chem-8 unremarkable - Drug screen unremarkable - Urinalysis unremarkable - Respiratory panel unremarkable - APTT unremarkable - Pro time INR unremarkable - CBC unremarkable - CMP unremarkable - CT head without contrast shows no signs of intracranial abnormality, herniation, mass effect.  No intracranial bleeding noted. - MRI brain does not show any signs of acute intracranial abnormality.  Patient treated utilizing 4.5 mg Benadryl, 10 mg Compazine, 10 mg Decadron, 25 mg Antivert.  Patient was taken away to MRI scanner before effects of these medications could be assessed.  Patient reports after receiving his  medications, he feels much better.  His dizziness is alleviated.  At this time, after discussing the patient and his case with Dr. Tyrone Nine, we believe that this patient is stable for discharge.  Patient provided with return precautions and he voiced understanding.  Patient had all of his questions answered to his satisfaction.  Patient stable at this time.   Final Clinical Impression(s) / ED Diagnoses Final diagnoses:  Dizziness    Rx / DC Orders ED Discharge Orders     None         Azucena Cecil, PA-C 09/14/21 2009    Floyd, Dan, DO 09/14/21 2213

## 2021-09-14 NOTE — ED Triage Notes (Addendum)
Patient states when he woke this AM at 0630 he noted that he had dizziness and felt off balance. Patient states that when he he went to bed at 0100 he felt normal.  Patient states he started water physical therapy and feels like his ears are full. Patient states he does have his head under water at times.  Patient did say that he had a seizure today. Patient denies falling or hitting his head.

## 2021-09-15 ENCOUNTER — Telehealth: Payer: Self-pay | Admitting: Family Medicine

## 2021-09-15 ENCOUNTER — Telehealth: Payer: Self-pay

## 2021-09-15 NOTE — Telephone Encounter (Signed)
.. ?  Medicaid Managed Care  ? ?Unsuccessful Outreach Note ? ?09/15/2021 ?Name: Joshua [name] MRN: [id number] DOB: Sep 16, 1960 ? ?Referred by: Charlott Rakes, MD ?Reason for referral : High Risk Managed Medicaid (Called the patient today to get his phone visit with the [facility] RNCM rescheduled. I left my name and number on his VM.) ? ? ?A second unsuccessful telephone outreach was attempted today. The patient was referred to the case management team for assistance with care management and care coordination.  ? ?Follow Up Plan: The care management team will reach out to the patient again over the next 14 days.  ? ? ?Reita Chard ?Care Guide, High Risk Medicaid Managed Care ?Embedded Care Coordination ?[facility]  ? ? ?

## 2021-09-15 NOTE — Telephone Encounter (Signed)
Transition Care Management Unsuccessful Follow-up Telephone Call ? ?Date of discharge and from where:  09/14/2021 from [facility] ? ?Attempts:  1st Attempt ? ?Reason for unsuccessful TCM follow-up call:  Left voice message ? ? ? ?

## 2021-09-16 ENCOUNTER — Emergency Department (HOSPITAL_COMMUNITY)
Admission: EM | Admit: 2021-09-16 | Discharge: 2021-09-16 | Disposition: A | Discharge status: Home / Self Care | Payer: Medicare Other | Attending: Emergency Medicine | Admitting: Emergency Medicine

## 2021-09-16 ENCOUNTER — Other Ambulatory Visit: Payer: Self-pay

## 2021-09-16 ENCOUNTER — Emergency Department (HOSPITAL_COMMUNITY)
Admission: EM | Admit: 2021-09-16 | Discharge: 2021-09-17 | Disposition: A | Discharge status: Home / Self Care | Payer: Medicare Other | Source: Home / Self Care | Attending: Emergency Medicine | Admitting: Emergency Medicine

## 2021-09-16 ENCOUNTER — Encounter (HOSPITAL_COMMUNITY): Payer: Self-pay

## 2021-09-16 DIAGNOSIS — Z79899 Other long term (current) drug therapy: Secondary | ICD-10-CM | POA: Insufficient documentation

## 2021-09-16 DIAGNOSIS — H9203 Otalgia, bilateral: Secondary | ICD-10-CM | POA: Insufficient documentation

## 2021-09-16 DIAGNOSIS — Z7982 Long term (current) use of aspirin: Secondary | ICD-10-CM | POA: Insufficient documentation

## 2021-09-16 DIAGNOSIS — R42 Dizziness and giddiness: Secondary | ICD-10-CM | POA: Insufficient documentation

## 2021-09-16 DIAGNOSIS — I10 Essential (primary) hypertension: Secondary | ICD-10-CM | POA: Insufficient documentation

## 2021-09-16 LAB — CBG MONITORING, ED: Glucose-Capillary: 97 mg/dL (ref 70–99)

## 2021-09-16 MED ORDER — MECLIZINE HCL 25 MG PO TABS
25.0000 mg | ORAL_TABLET | Freq: Once | ORAL | Status: AC
  Administered 2021-09-16: 25 mg via ORAL
  Filled 2021-09-16: qty 1

## 2021-09-16 MED ORDER — MECLIZINE HCL 25 MG PO TABS: 25.0000 mg | ORAL_TABLET | Freq: Three times a day (TID) | ORAL | 0 refills | Status: DC | PRN

## 2021-09-16 NOTE — Telephone Encounter (Addendum)
Transition Care Management Follow-up Telephone Call ?Date of discharge and from where: 09/14/2021 from [facility] [facility] ?How have you been since you were released from the hospital? Patient stated that he is feeling better and is currently at the [facility]. Patient did not have any questions or concerns.  ?Any questions or concerns? No ? ?Items Reviewed: ?Did the pt receive and understand the discharge instructions provided? Yes  ?Medications obtained and verified? Yes  ?Other? No  ?Any new allergies since your discharge? No  ?Dietary orders reviewed? No ?Do you have support at home? Yes  ? ?Functional Questionnaire: (I = Independent and D = Dependent) ?ADLs: I ? ?Bathing/Dressing- I ? ?Meal Prep- I ? ?Eating- I ? ?Maintaining continence- I ? ?Transferring/Ambulation- I ? ?Managing Meds- I ? ? ?Follow up appointments reviewed: ? ?PCP Hospital f/u appt confirmed? No  Patient encouraged to follow up with PCP if symptoms persisted ?[facility] f/u appt confirmed? No   ?Are transportation arrangements needed? No Not at this time, however I did inform patient that [facility] would provide transportation if needed.  ?If their condition worsens, is the pt aware to call PCP or go to the Emergency Dept.? Yes ?Was the patient provided with contact information for the PCP's office or ED? Yes ?Was to pt encouraged to call back with questions or concerns? Yes ? ?

## 2021-09-16 NOTE — ED Provider Triage Note (Signed)
Emergency Medicine Provider Triage Evaluation Note ? ?Joshua [name] , a 62 y.o. male  was evaluated in triage.  Pt complains of dizziness.  Patient is a bounce back from this ED earlier today where he was evaluated for the symptoms.  Patient has extensive neurologic work-up 2 days prior with without acute abnormalities.  Patient states when he was discharged, he was driving home and needed to pull over secondary to the dizziness.  He endorses his head feeling "slushy".  Now feels like driving home and is very concerned by the degree of dizziness. ? ?Review of Systems  ?Positive: As above ?Negative:  ? ?Physical Exam  ?BP (!) 156/90 (BP Location: Left Arm)   Pulse 88   Temp 97.7 ?F (36.5 ?C) (Oral)   Resp 18   SpO2 100%  ?Gen:   Awake, no distress   ?Resp:  Normal effort  ?MSK:   Moves extremities without difficulty  ?Other:  Normal neuro exam ? ?Medical Decision Making  ?Medically screening exam initiated at 11:14 PM.  Appropriate orders placed.  Joshua [name] was informed that the remainder of the evaluation will be completed by another provider, this initial triage assessment does not replace that evaluation, and the importance of remaining in the ED until their evaluation is complete. ? ? ?  ?Joshua [name], [place] ?09/16/21 2315 ? ?

## 2021-09-16 NOTE — ED Triage Notes (Signed)
Patient stated he was seen a couple days ago for dizziness, was sent home, felt okay and then tonight started feeling dizzy again. Patient worked out, went home and felt fuzzy. He said he took it lightly at the gym. He did eat his dinner tonight. No LOC. He said right about the same time the same thing occurred. He thinks something might be off with his medications.  ?

## 2021-09-16 NOTE — Discharge Instructions (Signed)
I have sent you home with a prescription for meclizine, which is used for vertigo and dizziness symptoms. Please follow up with your primary care doctor to discuss which medications you are on and what could be causing your ongoing dizziness. Return if you develop weakness on one side of your body, slurred speech or drooping of the face. ?

## 2021-09-16 NOTE — ED Provider Notes (Signed)
[facility] DEPT Provider Note   CSN: [id number] Arrival date & time: 09/16/21  4665     History  Chief Complaint  Patient presents with   Dizziness    Joshua [name] is a 61 y.o. male who was seen here 2 days ago for evaluation of dizziness with extensive work-up presents again for recurrent dizziness upon walking.  Patient states that his symptoms seem slightly improved when he was discharged the other day and now he is starting to "feel "off "again.  He notes that when he gets up and walking he just feels slightly off balance and dizzy.  He has not been able to make an appointment with his PCP to reevaluate his medications determine if this is the cause of his symptoms.  Patient denies headache, sore throat, chest pain, shortness of breath, abdominal pain, nausea, vomiting and diarrhea.  He has no other complaints.   Dizziness     Home Medications Prior to Admission medications   Medication Sig Start Date End Date Taking? Authorizing Provider  meclizine (ANTIVERT) 25 MG tablet Take 1 tablet (25 mg total) by mouth 3 (three) times daily as needed for dizziness. 09/16/21  Yes Kathe Becton R, PA-C  Accu-Chek Softclix Lancets lancets Use as instructed 08/13/21   Charlott Rakes, MD  acetaminophen (TYLENOL) 500 MG tablet Take 500 mg by mouth every 6 (six) hours as needed for moderate pain or headache.    [provider]  albuterol (VENTOLIN HFA) 108 (90 Base) MCG/ACT inhaler Inhale 2 puffs into the lungs every 6 (six) hours as needed for wheezing or shortness of breath. 08/13/21   Newlin, Enobong, MD  amLODipine (NORVASC) 5 MG tablet TAKE 1 TABLET (5 MG TOTAL) BY MOUTH DAILY. TO LOWER BLOOD PRESSURE. STOP LOSARTAN 08/13/21   Charlott Rakes, MD  aspirin 81 MG chewable tablet Chew 81 mg by mouth daily.    [provider]  atorvastatin (LIPITOR) 20 MG tablet Take 1 tablet (20 mg total) by mouth daily. 08/13/21   Charlott Rakes, MD  Blood  Glucose Monitoring Suppl (ACCU-CHEK GUIDE ME) w/Device KIT Use to check blood sugar once daily. 01/07/21   Charlott Rakes, MD  cetirizine (ZYRTEC) 10 MG tablet Take 1 tablet (10 mg total) by mouth daily. 07/27/21   Charlott Rakes, MD  clonazePAM (KLONOPIN) 0.5 MG tablet Take 1 tablet (0.5 mg total) by mouth 2 (two) times daily as needed for anxiety. 08/28/21   Pattricia Boss, MD  diazepam (VALIUM) 2 MG tablet Take 1 tablet (2 mg total) by mouth 2 (two) times daily. 08/15/21   Valarie Merino, MD  docusate sodium (COLACE) 100 MG capsule Take 1 capsule (100 mg total) by mouth 2 (two) times daily. Patient not taking: Reported on 08/15/2021 01/09/21   Meredith Pel, MD  fluticasone [facility]) 50 MCG/ACT nasal spray Place 2 sprays into both nostrils daily. Patient taking differently: Place 2 sprays into both nostrils daily as needed for allergies. 07/27/21   Charlott Rakes, MD  glucose blood (ACCU-CHEK GUIDE) test strip Use to check blood sugar once daily. 08/13/21   Charlott Rakes, MD  hydrocortisone (ANUSOL-HC) 2.5 % rectal cream Place 1 application rectally 2 (two) times daily. 08/22/21   Lacretia Leigh, MD  Lacosamide (VIMPAT) 100 MG TABS Take 1 tablet (100 mg total) by mouth in the morning and at bedtime. 06/29/21   [name], MD  lamoTRIgine (LAMICTAL) 100 MG tablet Take 2 tablets (200 mg total) by mouth 2 (two) times  daily. 08/12/21   [name], MD  meloxicam (MOBIC) 15 MG tablet 1 po q day prn pain 09/03/21   Meredith Pel, MD  metFORMIN (GLUCOPHAGE) 500 MG tablet Take 1 tablet (500 mg total) by mouth 2 (two) times daily. 08/13/21   Charlott Rakes, MD  Misc. Devices MISC Please provide BP cuff for patient 07/08/20   Argentina Donovan, PA-C  pantoprazole (PROTONIX) 20 MG tablet Take 1 tablet (20 mg total) by mouth 2 (two) times daily before a meal. 08/13/21 08/13/22  Charlott Rakes, MD  tamsulosin (FLOMAX) 0.4 MG CAPS capsule Take 1 capsule (0.4 mg total) by mouth daily. 08/12/21    Billey Co, MD  VITAMIN A PO Take 1 capsule by mouth daily.    [provider]      Allergies    Patient has no known allergies.    Review of Systems   Review of Systems  Neurological:  Positive for dizziness.   Physical Exam Updated Vital Signs BP (!) 146/90 (BP Location: Left Arm)    Pulse 93    Temp 97.8 F (36.6 C) (Oral)    Resp 16    Ht _0  (1.727 m)    Wt 79.4 kg    SpO2 98%    BMI 26.61 kg/m  Physical Exam Vitals and nursing note reviewed.  Constitutional:      General: He is not in acute distress.    Appearance: He is not ill-appearing.  HENT:     Head: Atraumatic.     Comments: Negative Dix-Hallpike, bilateral cerumen buildup Eyes:     Conjunctiva/sclera: Conjunctivae normal.     Pupils: Pupils are equal, round, and reactive to light.  Cardiovascular:     Rate and Rhythm: Normal rate and regular rhythm.     Pulses: Normal pulses.     Heart sounds: No murmur heard. Pulmonary:     Effort: Pulmonary effort is normal. No respiratory distress.     Breath sounds: Normal breath sounds.  Abdominal:     General: Abdomen is flat. There is no distension.     Palpations: Abdomen is soft.     Tenderness: There is no abdominal tenderness.  Musculoskeletal:        General: Normal range of motion.     Cervical back: Normal range of motion.  Skin:    General: Skin is warm and dry.     Capillary Refill: Capillary refill takes less than 2 seconds.  Neurological:     General: No focal deficit present.     Mental Status: He is alert.     Comments: Speech is clear, able to follow commands CN III-XII intact Normal strength in upper and lower extremities bilaterally including dorsiflexion and plantar flexion, strong and equal grip strength Sensation normal to light and sharp touch Moves extremities without ataxia, coordination intact Normal finger to nose and rapid alternating movements No pronator drift    Psychiatric:        Mood and Affect: Mood normal.     ED Results / Procedures / Treatments   Labs (all labs ordered are listed, but only abnormal results are displayed) Labs Reviewed  CBG MONITORING, ED    EKG None  Radiology No results found.  Procedures Procedures    Medications Ordered in ED Medications  meclizine (ANTIVERT) tablet 25 mg (has no administration in time range)    ED Course/ Medical Decision Making/ A&P  Medical Decision Making  History:  Per HPI Social determinants of health: None  Initial impression:  This patient presents to the ED for concern of dizziness, this involves an extensive number of treatment options, and is a complaint that carries with it a high risk of complications and morbidity.   Differentials include BPPV, vertigo, cerebellar stroke, dehydration Overall well-appearing 37-year-old male in no acute distress.  Vitals are normal.  He is has similar complaints to 2 days ago where he had extensive work-up including negative CT head, MRI and labs.  I discussed with patient his goals for his visit today since he had such an extensive work-up the other day and repeat imaging would not be beneficial in determining his symptoms.  Patient is understanding and requests prescription for medication that helps with dizziness that he received here in the ED the other day.  He agrees that he is not interested in additional lab work or imaging.  I think this is a reasonable option as his dizziness is not new or worsened.  Neurological exam was normal with no focal deficits.  Negative Dix-Hallpike.  Remainder of exam benign.  Medicines ordered and prescription drug management:  I ordered medication including: Meclizine 25 mg for dizziness Reevaluation of the patient after these medicines showed that the patient improved I have reviewed the patients home medicines and have made adjustments as needed  Disposition:  After consideration of the diagnostic results, physical exam,  history and the patients response to treatment feel that the patent would benefit from discharge with strict return precautions.   Dizziness: Return precautions were discussed.  Advised him to make appointment with his primary care physician in order to reassess medication since that he believes this is causing his symptoms.  All questions were asked and answered and patient was discharged home in good condition.  Sent him home with prescription for meclizine.   Final Clinical Impression(s) / ED Diagnoses Final diagnoses:  Dizziness    Rx / DC Orders ED Discharge Orders          Ordered    meclizine (ANTIVERT) 25 MG tablet  3 times daily PRN        09/16/21 2017              Rodena Piety 09/16/21 2047    Drenda Freeze, MD 09/16/21 [phone]

## 2021-09-16 NOTE — ED Triage Notes (Signed)
Patient just discharged. He said he got up the road and had to pull over. He said his back and leg are having spasms.  ?

## 2021-09-17 ENCOUNTER — Telehealth: Payer: Self-pay

## 2021-09-17 ENCOUNTER — Emergency Department (HOSPITAL_COMMUNITY): Payer: Medicare Other

## 2021-09-17 ENCOUNTER — Other Ambulatory Visit: Payer: Self-pay | Admitting: Internal Medicine

## 2021-09-17 ENCOUNTER — Encounter (HOSPITAL_COMMUNITY): Payer: Self-pay

## 2021-09-17 DIAGNOSIS — C700 Malignant neoplasm of cerebral meninges: Secondary | ICD-10-CM

## 2021-09-17 DIAGNOSIS — R42 Dizziness and giddiness: Secondary | ICD-10-CM | POA: Diagnosis not present

## 2021-09-17 DIAGNOSIS — R569 Unspecified convulsions: Secondary | ICD-10-CM

## 2021-09-17 LAB — CBC
HCT: 42.4 % (ref 39.0–52.0)
Hemoglobin: 14.3 g/dL (ref 13.0–17.0)
MCH: 31 pg (ref 26.0–34.0)
MCHC: 33.7 g/dL (ref 30.0–36.0)
MCV: 92 fL (ref 80.0–100.0)
Platelets: 206 10*3/uL (ref 150–400)
RBC: 4.61 MIL/uL (ref 4.22–5.81)
RDW: 13 % (ref 11.5–15.5)
WBC: 6.9 10*3/uL (ref 4.0–10.5)
nRBC: 0 % (ref 0.0–0.2)

## 2021-09-17 LAB — BASIC METABOLIC PANEL
Anion gap: 8 (ref 5–15)
BUN: 16 mg/dL (ref 6–20)
CO2: 26 mmol/L (ref 22–32)
Calcium: 8.7 mg/dL — ABNORMAL LOW (ref 8.9–10.3)
Chloride: 105 mmol/L (ref 98–111)
Creatinine, Ser: 0.94 mg/dL (ref 0.61–1.24)
GFR, Estimated: >60 mL/min (ref >60)
Glucose, Bld: 99 mg/dL (ref 70–99)
Potassium: 4.5 mmol/L (ref 3.5–5.1)
Sodium: 139 mmol/L (ref 135–145)

## 2021-09-17 MED ORDER — IOHEXOL 300 MG/ML  SOLN
75.0000 mL | Freq: Once | INTRAMUSCULAR | Status: AC | PRN
  Administered 2021-09-17: 75 mL via INTRAVENOUS

## 2021-09-17 MED ORDER — SODIUM CHLORIDE (PF) 0.9 % IJ SOLN
INTRAMUSCULAR | Status: AC
  Filled 2021-09-17: qty 50

## 2021-09-17 NOTE — Telephone Encounter (Signed)
Pt called back to discuss issues with medications. Patient wants Dr. Mickeal Skinner to know that he would like to "go back to his old klonopin regimen." Current mediations reviewed. Explained that patient should take his Klonopin as prescribed and instead of taking additional Klonopin to sleep he should try OTC meds to sleep. Patient agreed to take Klonopin as prescribed for one week to see if dizziness improves. Pt has presented to ED twice in last three days with issues related to dizziness and has been prescribed Antivert. ?

## 2021-09-17 NOTE — Telephone Encounter (Signed)
Pt called stating, "I would like to go back on my old medication plan." Attempted to call patient back to clarify request but was unable to reach pt. ?

## 2021-09-17 NOTE — Telephone Encounter (Signed)
Called pt to discuss the need for him to attend appointments so that Dr. Mickeal Skinner can address medication issues. Patient has had multiple no shows recently. Patient requested that he be scheduled for an appointment with Dr. Mickeal Skinner. Scheduling message sent. ?

## 2021-09-17 NOTE — Discharge Instructions (Signed)
Continue meclizine as previously prescribed for any persistent dizziness.  The imaging that you have had completed over the past 3 days has all been reassuring.  We have provided a referral to ENT for follow-up should you have ongoing issues with ear and neck pain.  Continue your daily prescribed medications and defer to your neurologist about any potential medication changes or concerns. Follow up with your primary care doctor as needed. ?

## 2021-09-17 NOTE — ED Provider Notes (Signed)
[place] DEPT Provider Note   CSN: [id number] Arrival date & time: 09/16/21  2248     History  Chief Complaint  Patient presents with   Dizziness    Joshua [name] is a 61 y.o. male.  61 y/o male with hx of meningioma x 2 s/p parasagittal craniotomy, seizures, HTN, HLD, GERD presents to the ED for repeat evaluation. Was seen on 09/14/21 for dizziness, subsequently evaluated for same yesterday. Has reassuring head CT and MRI 3 days ago with respect to this complaint. Reports he returned to the ED because he continues to have b/l ear pain and a "sloshing" sound in his b/l ears. His ear pain radiates down to his neck and jaw. This has been constant. He expresses that he does not feel as though this complaint was fully addressed. He does endorse ongoing, recurrent issues with dizziness which he attributes to his prior brain surgeries. Patient feels that some of these complaints may be related to some recent dosage changes to his daily medications prescribed by his neurologist, but he has not reached out to them to inquire about this.  The history is provided by the patient. No language interpreter was used.  Dizziness     Home Medications Prior to Admission medications   Medication Sig Start Date End Date Taking? Authorizing Provider  Accu-Chek Softclix Lancets lancets Use as instructed 08/13/21   Charlott Rakes, MD  acetaminophen (TYLENOL) 500 MG tablet Take 500 mg by mouth every 6 (six) hours as needed for moderate pain or headache.    [provider]  albuterol (VENTOLIN HFA) 108 (90 Base) MCG/ACT inhaler Inhale 2 puffs into the lungs every 6 (six) hours as needed for wheezing or shortness of breath. 08/13/21   Newlin, Enobong, MD  amLODipine (NORVASC) 5 MG tablet TAKE 1 TABLET (5 MG TOTAL) BY MOUTH DAILY. TO LOWER BLOOD PRESSURE. STOP LOSARTAN 08/13/21   Charlott Rakes, MD  aspirin 81 MG chewable tablet Chew 81 mg by mouth daily.    [provider]  atorvastatin (LIPITOR) 20 MG tablet Take 1 tablet (20 mg total) by mouth daily. 08/13/21   Charlott Rakes, MD  Blood Glucose Monitoring Suppl (ACCU-CHEK GUIDE ME) w/Device KIT Use to check blood sugar once daily. 01/07/21   Charlott Rakes, MD  cetirizine (ZYRTEC) 10 MG tablet Take 1 tablet (10 mg total) by mouth daily. 07/27/21   Charlott Rakes, MD  clonazePAM (KLONOPIN) 0.5 MG tablet Take 1 tablet (0.5 mg total) by mouth 2 (two) times daily as needed for anxiety. 08/28/21   Pattricia Boss, MD  diazepam (VALIUM) 2 MG tablet Take 1 tablet (2 mg total) by mouth 2 (two) times daily. 08/15/21   Valarie Merino, MD  docusate sodium (COLACE) 100 MG capsule Take 1 capsule (100 mg total) by mouth 2 (two) times daily. Patient not taking: Reported on 08/15/2021 01/09/21   Meredith Pel, MD  fluticasone [facility]) 50 MCG/ACT nasal spray Place 2 sprays into both nostrils daily. Patient taking differently: Place 2 sprays into both nostrils daily as needed for allergies. 07/27/21   Charlott Rakes, MD  glucose blood (ACCU-CHEK GUIDE) test strip Use to check blood sugar once daily. 08/13/21   Charlott Rakes, MD  hydrocortisone (ANUSOL-HC) 2.5 % rectal cream Place 1 application rectally 2 (two) times daily. 08/22/21   Lacretia Leigh, MD  Lacosamide (VIMPAT) 100 MG TABS Take 1 tablet (100 mg total) by mouth in the morning and at bedtime. 06/29/21   Vaslow,  Acey Lav, MD  lamoTRIgine (LAMICTAL) 100 MG tablet Take 2 tablets (200 mg total) by mouth 2 (two) times daily. 08/12/21   [name], MD  meclizine (ANTIVERT) 25 MG tablet Take 1 tablet (25 mg total) by mouth 3 (three) times daily as needed for dizziness. 09/16/21   [name], PA-C  meloxicam (MOBIC) 15 MG tablet 1 po q day prn pain 09/03/21   Meredith Pel, MD  metFORMIN (GLUCOPHAGE) 500 MG tablet Take 1 tablet (500 mg total) by mouth 2 (two) times daily. 08/13/21   Charlott Rakes, MD  Misc. Devices MISC Please provide BP cuff for  patient 07/08/20   Argentina Donovan, PA-C  pantoprazole (PROTONIX) 20 MG tablet Take 1 tablet (20 mg total) by mouth 2 (two) times daily before a meal. 08/13/21 08/13/22  Charlott Rakes, MD  tamsulosin (FLOMAX) 0.4 MG CAPS capsule Take 1 capsule (0.4 mg total) by mouth daily. 08/12/21   Billey Co, MD  VITAMIN A PO Take 1 capsule by mouth daily.    [provider]      Allergies    Patient has no known allergies.    Review of Systems   Review of Systems  Neurological:  Positive for dizziness.  Ten systems reviewed and are negative for acute change, except as noted in the HPI.    Physical Exam Updated Vital Signs BP (!) 124/93    Pulse 60    Temp 97.7 F (36.5 C) (Oral)    Resp 16    SpO2 99%   Physical Exam Vitals and nursing note reviewed.  Constitutional:      General: He is not in acute distress.    Appearance: He is well-developed. He is not diaphoretic.     Comments: Nontoxic appearing and in NAD  HENT:     Head: Normocephalic and atraumatic.     Right Ear: Tympanic membrane, ear canal and external ear normal.     Left Ear: Tympanic membrane, ear canal and external ear normal.     Ears:     Comments: Mild to moderate cerumen in b/l ear canals w/o impaction or complete canal occlusion. No significant middle ear effusion. No TM bulging, retraction, perforation b/l.    Mouth/Throat:     Comments: Normal phonation. Tolerating secretions w/o difficulty. No tripoding or stridor. Eyes:     General: No scleral icterus.    Conjunctiva/sclera: Conjunctivae normal.  Neck:     Comments: No meningismus  Pulmonary:     Effort: Pulmonary effort is normal. No respiratory distress.     Comments: Respirations even and unlabored Musculoskeletal:        General: Normal range of motion.     Cervical back: Normal range of motion.  Skin:    General: Skin is warm and dry.     Coloration: Skin is not pale.     Findings: No erythema or rash.  Neurological:     Mental  Status: He is alert and oriented to person, place, and time.     Coordination: Coordination normal.     Comments: GCS 15. Speech is clear, goal oriented. No facial asymmetry. Moving all extremities spontaneously, symmetrically. No appreciable focal deficit.  Psychiatric:        Behavior: Behavior normal.    ED Results / Procedures / Treatments   Labs (all labs ordered are listed, but only abnormal results are displayed) Labs Reviewed  BASIC METABOLIC PANEL - Abnormal; Notable for the following components:  Result Value   Calcium 8.7 (*)    All other components within normal limits  CBC    EKG None  Radiology CT Soft Tissue Neck W Contrast  Result Date: 09/17/2021 CLINICAL DATA:  61 year old male with bilateral ear pain, jaw pain and neck pain for several months. EXAM: CT NECK WITH CONTRAST TECHNIQUE: Multidetector CT imaging of the neck was performed using the standard protocol following the bolus administration of intravenous contrast. RADIATION DOSE REDUCTION: This exam was performed according to the departmental dose-optimization program which includes automated exposure control, adjustment of the mA and/or kV according to patient size and/or use of iterative reconstruction technique. CONTRAST:  17m OMNIPAQUE IOHEXOL 300 MG/ML  SOLN COMPARISON:  Brain MRI 09/14/2021.  CTA head and neck 10/09/2019. FINDINGS: Pharynx and larynx: Larynx and pharynx soft tissue contours are within normal limits and stable since 2021. Negative parapharyngeal and retropharyngeal spaces. Salivary glands: Negative.  Negative sublingual space. Thyroid: Negative. Lymph nodes: Bilateral cervical lymph nodes are stable since 2021 and within normal limits. No lymphadenopathy. Vascular: Major vascular structures in the neck and at the skull base appear to remain patent and normal. Limited intracranial: Negative, dominant distal right vertebral artery as before. Visualized orbits: Negative, postoperative changes to  the globes. Mastoids and visualized paranasal sinuses: Bilateral tympanic cavities and mastoids are clear. External auditory canals appear stable since 2021 and unremarkable along with the visible periauricular soft tissues. Visible paranasal sinuses are clear. Skeleton: Mandible is intact and TMJ is normally located. Mild asymmetric sclerosis of the right mandible condyle is unchanged since 2021. No other TMJ degeneration. No acute dental finding, much of the posterior dentition is absent. Chronic cervical spine degeneration including evidence of Diffuse idiopathic skeletal hyperostosis (DISH). With bulky anterior endplate osteophytes from C5 continuing into the upper thoracic spine. No acute osseous abnormality identified. Upper chest: Some streak artifact from right shoulder arthroplasty. Otherwise negative. IMPRESSION: 1. Negative CT appearance of the Neck. No explanation for ear or jaw pain. 2. 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 Electronically Signed   By: HGenevie AnnM.D.   On: 09/17/2021 05:17    Procedures Procedures    Medications Ordered in ED Medications  sodium chloride (PF) 0.9 % injection (has no administration in time range)  iohexol (OMNIPAQUE) 300 MG/ML solution 75 mL (75 mLs Intravenous Contrast Given 09/17/21 0457)    ED Course/ Medical Decision Making/ A&P Clinical Course as of 09/17/21 0559  Fri Sep 17, 2021  0545 Patient ambulates in the department without difficulty.  He was noted to ambulate with steady gait.  Does report that the meclizine given previously has helped with his dizziness.  He has no additional complaints at this time.  Communicated results of CT scan which are reassuring.  Will give outpatient referral to ENT in light of ongoing otalgia. [KH]    Clinical Course User Index [KH] HAntonietta Breach PA-C                           Medical Decision Making Amount and/or Complexity of Data Reviewed Labs:  ordered. Radiology: ordered.  Risk Prescription drug management.   This patient presents to the ED for concern of b/l otagia with pain radiating to his neck, this involves an extensive number of treatment options, and is a complaint that carries with it a high risk of complications and morbidity.  The differential diagnosis includes bilateral AOM vs bilateral AOE vs mastoiditis vs  mass vs cellulitis/abscess vs lymphadenopathy/lymphadenitis vs sinusitis vs b/l cerumen impaction   Co morbidities that complicate the patient evaluation  Brain meningioma x 2 Seizures HTN   Additional history obtained:  Additional history obtained from medical records External records from outside source obtained and reviewed including prior reassuring head CT and brain MRI from 09/14/21.   Lab Tests:  I Ordered, and personally interpreted labs.  The pertinent results include:  stable and normal CBC, BMP   Imaging Studies ordered:  I ordered imaging studies including CT soft tissue neck w/contrast  I independently visualized and interpreted imaging which showed no acute pathology I agree with the radiologist interpretation   Cardiac Monitoring:  The patient was maintained on a cardiac monitor.  I personally viewed and interpreted the cardiac monitored which showed an underlying rhythm of: NSR   Test Considered:  Strep PCR   Reevaluation:  After the interventions noted above, I reevaluated the patient and found that they have : remained stable   Social Determinants of Health:  Insured    Dispostion:  After consideration of the diagnostic results and the patients response to treatment, I feel that the patent would benefit from continued follow up with his neurologist. Will also refer to ENT given c/o persistent bilateral otalgia - may be playing into component of BPPV. No focal neurologic deficits on exam. Previously negative work up to exclude central cause of vertigo. Complains  minimally of vertigo during this repeat visit and notes improvement w/Meclizine. Ambulatory with steady gait. Return precautions discussed and provided. Patient discharged in stable condition with no unaddressed concerns.         Final Clinical Impression(s) / ED Diagnoses Final diagnoses:  Otalgia of both ears    Rx / DC Orders ED Discharge Orders          Ordered    Ambulatory referral to ENT        09/17/21 0550              Antonietta Breach, PA-C 09/17/21 0600    Molpus, Jenny Reichmann, MD 09/17/21 [phone]

## 2021-09-17 NOTE — ED Notes (Signed)
Pt currently in CT.

## 2021-09-20 ENCOUNTER — Telehealth: Payer: Self-pay

## 2021-09-20 ENCOUNTER — Telehealth: Payer: Self-pay | Admitting: Internal Medicine

## 2021-09-20 NOTE — Telephone Encounter (Signed)
Patient called requesting that  this office fax health information to [facility] . Patient advised to present to [facility] to sign ROI information which he did. Most recent progress note 06/01/21 faxed to [place].  ?Signed ROI sent to scanning center. ?

## 2021-09-20 NOTE — Telephone Encounter (Signed)
Scheduled per 03/03 scheduled message, patient has been called and notified. ?

## 2021-09-21 ENCOUNTER — Telehealth: Payer: Self-pay | Admitting: *Deleted

## 2021-09-21 NOTE — Telephone Encounter (Signed)
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 Requested a return call to let us know if he wants to come by and pick up forms so he can complete his part. Or does he want [place] to mail to him.  ?

## 2021-09-24 ENCOUNTER — Telehealth: Payer: Self-pay | Admitting: Family Medicine

## 2021-09-24 NOTE — Telephone Encounter (Signed)
Copied from [place] [id number]. Topic: General - Other >> Sep 24, 2021 11:44 AM Joshua [name] A wrote: Reason for CRM: The patient would like to speak with a member of clinical staff when possible  The patient would like to speak with a member of clinical staff to review their sleep study results   Please contact further when possible

## 2021-09-24 NOTE — Telephone Encounter (Signed)
Copied from [place] [id number]. Topic: General - Other ?>> Sep 24, 2021 10:09 AM Antonieta Iba C wrote: ?Reason for CRM: pt called in for assistance. Pt says that he was seen at [facility] and was given a glucose test. Pt says that he never received his results or any education on what he should do next? ? ? ?Pt would also like to see a ENT. Pt says that he sometime he cough up green phlym. Pt says that he has also discuss sometimes having difficulty swallowing with PCP.  ? ? ?CB: [phone] - ?

## 2021-09-27 ENCOUNTER — Inpatient Hospital Stay: Payer: Medicare Other | Attending: Internal Medicine | Admitting: Internal Medicine

## 2021-09-27 ENCOUNTER — Other Ambulatory Visit: Payer: Self-pay

## 2021-09-27 VITALS — BP 124/80 | HR 84 | Temp 97.4°F | Resp 16 | Ht 68.0 in | Wt 195.0 lb

## 2021-09-27 DIAGNOSIS — G40109 Localization-related (focal) (partial) symptomatic epilepsy and epileptic syndromes with simple partial seizures, not intractable, without status epilepticus: Secondary | ICD-10-CM | POA: Diagnosis not present

## 2021-09-27 DIAGNOSIS — R569 Unspecified convulsions: Secondary | ICD-10-CM

## 2021-09-27 DIAGNOSIS — D320 Benign neoplasm of cerebral meninges: Secondary | ICD-10-CM | POA: Diagnosis not present

## 2021-09-27 DIAGNOSIS — F419 Anxiety disorder, unspecified: Secondary | ICD-10-CM | POA: Diagnosis not present

## 2021-09-27 DIAGNOSIS — Z79899 Other long term (current) drug therapy: Secondary | ICD-10-CM | POA: Diagnosis not present

## 2021-09-27 DIAGNOSIS — F17210 Nicotine dependence, cigarettes, uncomplicated: Secondary | ICD-10-CM | POA: Insufficient documentation

## 2021-09-27 MED ORDER — CLONAZEPAM 0.5 MG PO TABS: 0.5000 mg | ORAL_TABLET | Freq: Every day | ORAL | 2 refills | Status: DC

## 2021-09-27 NOTE — Progress Notes (Signed)
[facility] [place], [place] [address] [phone]   Interval Evaluation  Date of Service: 09/27/21 Patient Name: Joshua [name] Patient MRN: [id number] Patient DOB: 1961/05/15 Provider: [name], MD  Identifying Statement:  Joshua [name] is a 61 y.o. male with  parasaggital  meningioma and focal epilepsy  Oncologic History: 10/30/12: Craniotomy and resection of parasaggital meningioma by Dr. Vertell Limber (WHO I) 01/10/19: Repeat craniotomy, debulking resection by Dr. Vertell Limber after tumor recurrence, seizures.  Path demonstrates islands of anaplasia c/w grade II/III. 04/05/19: Completes post-operative IMRT with Dr. Lisbeth Renshaw  Interval History:  Joshua [name] presents today for clinical follow up. Describes only one seizure over the past 3 months, good comliance with Vimpat and Lamictal.  Still no utilizing any ativan at this time.  Klonopin he has been dosing 0.62m at night.  Remains physically active, engaged.  H+P (02/05/19) Patient presents to review clinical course and care for his meningioma.  Initially he presented in 2014 with headache syndrome, was found to have tumor on imaging which was resected by Dr. SVertell Limber  The patient was lost to follow up for ~5 years until he developed seizures, described as "twitching of left leg spreading to arm" this past month.  He required loads of Keppra and Dilantin to break events.  MRI demonstrated significant regrowth of the mass, and repeat craniotomy was performed on 01/10/19.  Small amount of residual tumor was visible on post-operative MRI.  Since surgery he has continued to experience numbness and clumsiness of his left leg, requiring cane for ambulation.  He is still pending home physical therapy evaluation.  He has had "small" seizures, consisting of few seconds of shaking of left leg, occurring less than daily.  Continues on Keppra and Dilantin.    Medications: Current Outpatient  Medications on File Prior to Visit  Medication Sig Dispense Refill   Accu-Chek Softclix Lancets lancets Use as instructed 100 each 12   acetaminophen (TYLENOL) 500 MG tablet Take 500 mg by mouth every 6 (six) hours as needed for moderate pain or headache.     albuterol (VENTOLIN HFA) 108 (90 Base) MCG/ACT inhaler Inhale 2 puffs into the lungs every 6 (six) hours as needed for wheezing or shortness of breath. 8 g 2   amLODipine (NORVASC) 5 MG tablet TAKE 1 TABLET (5 MG TOTAL) BY MOUTH DAILY. TO LOWER BLOOD PRESSURE. STOP LOSARTAN 90 tablet 0   aspirin 81 MG chewable tablet Chew 81 mg by mouth daily.     atorvastatin (LIPITOR) 20 MG tablet Take 1 tablet (20 mg total) by mouth daily. 90 tablet 1   Blood Glucose Monitoring Suppl (ACCU-CHEK GUIDE ME) w/Device KIT Use to check blood sugar once daily. 1 kit 0   cetirizine (ZYRTEC) 10 MG tablet Take 1 tablet (10 mg total) by mouth daily. 30 tablet 1   clonazePAM (KLONOPIN) 0.5 MG tablet Take 1 tablet (0.5 mg total) by mouth 2 (two) times daily as needed for anxiety. 30 tablet 0   diazepam (VALIUM) 2 MG tablet Take 1 tablet (2 mg total) by mouth 2 (two) times daily. 8 tablet 0   docusate sodium (COLACE) 100 MG capsule Take 1 capsule (100 mg total) by mouth 2 (two) times daily. 10 capsule 0   fluticasone (FLONASE) 50 MCG/ACT nasal spray Place 2 sprays into both nostrils daily. (Patient taking differently: Place 2 sprays into both nostrils daily as needed for allergies.) 16 g 1  glucose blood (ACCU-CHEK GUIDE) test strip Use to check blood sugar once daily. 100 each 6   hydrocortisone (ANUSOL-HC) 2.5 % rectal cream Place 1 application rectally 2 (two) times daily. 30 g 0   Lacosamide (VIMPAT) 100 MG TABS Take 1 tablet (100 mg total) by mouth in the morning and at bedtime. 60 tablet 3   lamoTRIgine (LAMICTAL) 100 MG tablet TAKE 2 TABLETS (200 MG TOTAL) BY MOUTH 2 (TWO) TIMES DAILY. 120 tablet 0   meclizine (ANTIVERT) 25 MG tablet Take 1 tablet (25 mg total)  by mouth 3 (three) times daily as needed for dizziness. 30 tablet 0   meloxicam (MOBIC) 15 MG tablet 1 po q day prn pain 30 tablet 0   metFORMIN (GLUCOPHAGE) 500 MG tablet Take 1 tablet (500 mg total) by mouth 2 (two) times daily. 180 tablet 1   Misc. Devices MISC Please provide BP cuff for patient 1 each 0   pantoprazole (PROTONIX) 20 MG tablet Take 1 tablet (20 mg total) by mouth 2 (two) times daily before a meal. 60 tablet 3   tamsulosin (FLOMAX) 0.4 MG CAPS capsule Take 1 capsule (0.4 mg total) by mouth daily. 90 capsule 3   VITAMIN A PO Take 1 capsule by mouth daily.     No current facility-administered medications on file prior to visit.    Allergies: No Known Allergies Past Medical History:  Past Medical History:  Diagnosis Date   Anxiety    Brain tumor ([facility]) 02/20/2013   brain tumor removed in March 2014, Dr Donald Pore   Diffuse idiopathic skeletal hyperostosis 06/05/2013   Dizziness    Enlarged prostate    GERD (gastroesophageal reflux disease)    Headache(784.0)    scattered   High cholesterol    Hypertension    Malignant meningioma of meninges of brain ([facility]) 03/07/2019   Meningioma ([facility] [facility])    Meningioma, recurrent of brain ([facility]) 01/31/2019   Neuropathy 12/05/2019   Pre-diabetes    Rotator cuff tear    right   Seizures ([facility])    11/27/19 last sz 1 wk ago   Past Surgical History:  Past Surgical History:  Procedure Laterality Date   APPLICATION OF CRANIAL NAVIGATION N/A 01/10/2019   Procedure: APPLICATION OF CRANIAL NAVIGATION;  Surgeon: Erline Levine, MD;  Location: [facility];  Service: Neurosurgery;  Laterality: N/A;   CATARACT EXTRACTION Right    CRANIOTOMY Right 11/06/2012   Procedure: CRANIOTOMY TUMOR EXCISION;  Surgeon: Erline Levine, MD;  Location: [facility];  Service: Neurosurgery;  Laterality: Right;  Right Parasagittal craniotomy for meningioma with [facility]   CRANIOTOMY Right 01/10/2019   Procedure: Right Parasagittal Craniotomy for Tumor;  Surgeon: Erline Levine,  MD;  Location: [facility];  Service: Neurosurgery;  Laterality: Right;  Right parasagittal craniotomy for tumor   ESOPHAGOGASTRODUODENOSCOPY     JOINT REPLACEMENT Right 2018   right hip   REVERSE SHOULDER ARTHROPLASTY Right 01/08/2021   Procedure: RIGHT REVERSE SHOULDER ARTHROPLASTY;  Surgeon: Meredith Pel, MD;  Location: [facility];  Service: Orthopedics;  Laterality: Right;   right knee arthroscopy     SHOULDER ARTHROSCOPY Right 06/09/2020   Procedure: RIGHT SHOULDER ARTHROSCOPY AND DEBRIDEMENT;  Surgeon: Newt Minion, MD;  Location: [facility];  Service: Orthopedics;  Laterality: Right;   Social History:  Social History   Socioeconomic History   Marital status: Legally Separated    Spouse name: Not on file   Number of children: 2   Years of education: Not on file  Highest education level: Not on file  Occupational History   Not on file  Tobacco Use   Smoking status: Former    Types: Cigarettes    Quit date: 01/29/2019    Years since quitting: 2.6   Smokeless tobacco: Never   Tobacco comments:    weekend smoker  Vaping Use   Vaping Use: Never used  Substance and Sexual Activity   Alcohol use: Not Currently    Comment: social   Drug use: No   Sexual activity: Not Currently  Other Topics Concern   Not on file  Social History Narrative      Caffeine- soda occass   Social Determinants of Health   Financial Resource Strain: Not on file  Food Insecurity: Not on file  Transportation Needs: Not on file  Physical Activity: Not on file  Stress: Not on file  Social Connections: Not on file  Intimate Partner Violence: Not on file   Family History:  Family History  Problem Relation Age of Onset   Hypertension Mother    Dementia Mother    Diabetes Mother    Hypertension Father    CAD Father    Diabetes Father    CAD Brother     Review of Systems: Constitutional: Denies fevers, chills or abnormal weight loss Eyes: Denies blurriness of vision Ears,  nose, mouth, throat, and face: Denies mucositis or sore throat Respiratory: Denies cough, dyspnea or wheezes Cardiovascular: Denies palpitation, chest discomfort or lower extremity swelling Gastrointestinal:  Denies nausea, constipation, diarrhea GU: Denies dysuria or incontinence Skin: Denies abnormal skin rashes Neurological: Per HPI Musculoskeletal: Denies joint pain, back or neck discomfort. No decrease in ROM Behavioral/Psych: Denies anxiety, disturbance in thought content, and mood instability  Physical Exam: Vitals:   09/27/21 0938  BP: 124/80  Pulse: 84  Resp: 16  Temp: (!) 97.4 F (36.3 C)  SpO2: 100%   KPS: 80. General: Alert, cooperative, pleasant, in no acute distress Head: Craniotomy scar noted, dry and intact. EENT: No conjunctival injection or scleral icterus. Oral mucosa moist Lungs: Resp effort normal Cardiac: Regular rate and rhythm Abdomen: Soft, non-distended abdomen Skin: No rashes cyanosis or petechiae. Extremities: No clubbing or edema  Neurologic Exam: Mental Status: Awake, alert, attentive to examiner. Oriented to self and environment. Language is fluent with intact comprehension.  Cranial Nerves: Visual acuity is grossly normal. Visual fields are full. Extra-ocular movements intact. No ptosis. Face is symmetric, tongue midline. Motor: Tone and bulk are normal.Power is impaired in left leg, 4+/5 distally. Reflexes are symmetric, no pathologic reflexes present. Impaired heel to shin left leg Sensory: Stocking sensory loss Gait: Cane assisted  Labs: I have reviewed the data as listed    Component Value Date/Time   NA 139 09/17/2021 0406   NA 144 04/15/2020 1515   K 4.5 09/17/2021 0406   CL 105 09/17/2021 0406   CO2 26 09/17/2021 0406   GLUCOSE 99 09/17/2021 0406   BUN 16 09/17/2021 0406   BUN 13 04/15/2020 1515   CREATININE 0.94 09/17/2021 0406   CALCIUM 8.7 (L) 09/17/2021 0406   PROT 7.1 09/14/2021 1600   PROT 7.0 04/15/2020 1515   ALBUMIN  4.5 09/14/2021 1600   ALBUMIN 4.7 04/15/2020 1515   AST 18 09/14/2021 1600   ALT 25 09/14/2021 1600   ALKPHOS 64 09/14/2021 1600   BILITOT 0.5 09/14/2021 1600   BILITOT <0.2 04/15/2020 1515   GFRNONAA >60 09/17/2021 0406   GFRAA >60 04/17/2020 1313   Lab Results  Component Value Date   WBC 6.9 09/17/2021   NEUTROABS 3.5 09/14/2021   HGB 14.3 09/17/2021   HCT 42.4 09/17/2021   MCV 92.0 09/17/2021   PLT 206 09/17/2021   Imaging:  Axis Clinician Interpretation: I have personally reviewed the CNS images as listed.  My interpretation, in the context of the patient's clinical presentation, is stable disease  CT HEAD WO CONTRAST  Result Date: 09/14/2021 CLINICAL DATA:  Dizziness since wakening this morning EXAM: CT HEAD WITHOUT CONTRAST TECHNIQUE: Contiguous axial images were obtained from the base of the skull through the vertex without intravenous contrast. RADIATION DOSE REDUCTION: This exam was performed according to the departmental dose-optimization program which includes automated exposure control, adjustment of the mA and/or kV according to patient size and/or use of iterative reconstruction technique. COMPARISON:  08/05/2021 FINDINGS: Brain: Encephalomalacia at the frontal convexities related to prior craniotomy again noted. Confluent hypodensities throughout the periventricular white matter consistent with chronic small vessel ischemic changes, stable. No evidence of acute infarct or hemorrhage. Lateral ventricles and midline structures are unremarkable. No acute extra-axial fluid collections. No mass effect. Vascular: No hyperdense vessel or unexpected calcification. Skull: Stable postsurgical changes from craniotomy at the frontal convexities. No acute bony abnormality. Sinuses/Orbits: No acute finding. Other: None. IMPRESSION: 1. No acute intracranial process. 2. Stable postsurgical changes and chronic small vessel ischemic changes as above. Electronically Signed   By: Randa Ngo  M.D.   On: 09/14/2021 16:33   CT Soft Tissue Neck W Contrast  Result Date: 09/17/2021 CLINICAL DATA:  61 year old male with bilateral ear pain, jaw pain and neck pain for several months. EXAM: CT NECK WITH CONTRAST TECHNIQUE: Multidetector CT imaging of the neck was performed using the standard protocol following the bolus administration of intravenous contrast. RADIATION DOSE REDUCTION: This exam was performed according to the departmental dose-optimization program which includes automated exposure control, adjustment of the mA and/or kV according to patient size and/or use of iterative reconstruction technique. CONTRAST:  68m OMNIPAQUE IOHEXOL 300 MG/ML  SOLN COMPARISON:  Brain MRI 09/14/2021.  CTA head and neck 10/09/2019. FINDINGS: Pharynx and larynx: Larynx and pharynx soft tissue contours are within normal limits and stable since 2021. Negative parapharyngeal and retropharyngeal spaces. Salivary glands: Negative.  Negative sublingual space. Thyroid: Negative. Lymph nodes: Bilateral cervical lymph nodes are stable since 2021 and within normal limits. No lymphadenopathy. Vascular: Major vascular structures in the neck and at the skull base appear to remain patent and normal. Limited intracranial: Negative, dominant distal right vertebral artery as before. Visualized orbits: Negative, postoperative changes to the globes. Mastoids and visualized paranasal sinuses: Bilateral tympanic cavities and mastoids are clear. External auditory canals appear stable since 2021 and unremarkable along with the visible periauricular soft tissues. Visible paranasal sinuses are clear. Skeleton: Mandible is intact and TMJ is normally located. Mild asymmetric sclerosis of the right mandible condyle is unchanged since 2021. No other TMJ degeneration. No acute dental finding, much of the posterior dentition is absent. Chronic cervical spine degeneration including evidence of Diffuse idiopathic skeletal hyperostosis (DISH). With  bulky anterior endplate osteophytes from C5 continuing into the upper thoracic spine. No acute osseous abnormality identified. Upper chest: Some streak artifact from right shoulder arthroplasty. Otherwise negative. IMPRESSION: 1. Negative CT appearance of the Neck. No explanation for ear or jaw pain. 2. 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 Electronically Signed   By: HGenevie AnnM.D.   On: 09/17/2021 05:17   MR BRAIN WO CONTRAST  Result Date: 09/14/2021 CLINICAL DATA:  Dizziness.  Acute neurologic deficit. EXAM: MRI HEAD WITHOUT CONTRAST TECHNIQUE: Multiplanar, multiecho pulse sequences of the brain and surrounding structures were obtained without intravenous contrast. COMPARISON:  03/26/2021 FINDINGS: Brain: No acute infarct, mass effect or extra-axial collection. No acute or chronic hemorrhage. Hyperintense T2-weighted signal is moderately widespread throughout the white matter. Generalized volume loss without a clear lobar predilection. There is a gliosis of the para falcine frontal and parietal lobes at the meningioma treatment site, unchanged. 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 Skull and upper cervical spine: Remote biparietal craniotomy Sinuses/Orbits:No paranasal sinus fluid levels or advanced mucosal thickening. No mastoid or middle ear effusion. Normal orbits. IMPRESSION: 1. No acute intracranial abnormality. 2. 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 3. Chronic ischemic microangiopathy and generalized volume loss. Electronically Signed   By: Ulyses Jarred M.D.   On: 09/14/2021 19:19   SLEEP STUDY DOCUMENTS  Result Date: 09/13/2021 Ordered by an unspecified provider.   Assessment/Plan 1. Meningioma, recurrent of brain ([place])  2. Seizures [facility])  Mr.  Pinedo is clinically and radiographically stable today.  He is almost completely seizure free as well.   Will recommend continuing LMG 280m BID, Vimpat 2022mBID.   Klonopin can continue at 0.22m77mS as discussed, we provided a refill today.    He will continue to work on anxiety and recent stressors through his counselor.   We ask that GerLUKUS BINIONturn to clinic in 6 months for evaluation, or sooner if needed.  Next brain MRI can be in 1 year.  We appreciate the opportunity to participate in the care of GerLORAINE FREID  All questions were answered. The patient knows to call the clinic with any problems, questions or concerns. No barriers to learning were detected.    I have spent a total of 30 minutes of face-to-face and non-face-to-face time, excluding clinical staff time, preparing to see patient, ordering tests and/or medications, counseling the patient, and care coordination    ZacVentura SellersD Medical Director of Neuro-Oncology [facility] [place]/13/23 9:49 AM

## 2021-09-29 ENCOUNTER — Telehealth: Payer: Self-pay

## 2021-09-29 ENCOUNTER — Telehealth: Payer: Self-pay | Admitting: Family Medicine

## 2021-09-29 DIAGNOSIS — L602 Onychogryphosis: Secondary | ICD-10-CM

## 2021-09-29 NOTE — Telephone Encounter (Signed)
Copied from [place] [id number]. Topic: General - Other >> Sep 29, 2021  8:37 AM Joshua [name] A wrote: Reason for CRM: The patient has requested to speak with a member of staff when possible   The patient would like the previously requested results of their sleep study   The patient feels that their information is being withheld from them and has requested to speak with a member of administrative staff when possible   The patient has stressed the urgency of their request and would like to know the results of their sleep study   The patient would like their results as well the delay in response explained to them   Please contact further

## 2021-09-29 NOTE — Telephone Encounter (Signed)
Done

## 2021-09-29 NOTE — Telephone Encounter (Signed)
Copied from [place] [id number]. Topic: Referral - Request for Referral ?>> Sep 08, 2021 10:56 AM Erick Blinks wrote: ?Has patient seen PCP for this complaint? Yes.   ?*If NO, is insurance requiring patient see PCP for this issue before PCP can refer them? ?Referral for which specialty: Podiatry  ?Preferred provider/office: Highest recommended in network in [place]  ?Reason for referral: Having trouble with feet, there are red knots protruding and his feet stay dry. ? ?Pt was referred in 01/2021 to podiatry, pt is requesting a new referral be placed. ?

## 2021-09-30 ENCOUNTER — Telehealth: Payer: Self-pay

## 2021-09-30 NOTE — Telephone Encounter (Signed)
I spoke to [employer], [employer] ,who confirmed that she received the application but still needs to process it.  ? ?

## 2021-09-30 NOTE — Telephone Encounter (Signed)
Call placed to patient and VM was left informing patient to return phone call. 

## 2021-09-30 NOTE — Telephone Encounter (Signed)
Sleep study is negative for sleep apnea

## 2021-09-30 NOTE — Telephone Encounter (Signed)
Pt is requesting sleep study results. Results has been printed and placed in box outside office. ?

## 2021-09-30 NOTE — Telephone Encounter (Signed)
Pt called very upset that he has not received call back about his sleep study results .  He has checked with the sleep study company and they have told him they have sent the results in to the office.  He said he is going to call the ins if he does not get response . ?Today ? ?CB#  [phone] ?

## 2021-10-01 NOTE — Telephone Encounter (Signed)
Pt returned phone call and was informed of sleep study report. Patient was very upset that he was just informed of results and he states that he will reach out to his insurance company and find another providers office. ?

## 2021-10-01 NOTE — Telephone Encounter (Signed)
Pt returned call and was informed of referral being placed. ?

## 2021-10-05 ENCOUNTER — Ambulatory Visit (INDEPENDENT_AMBULATORY_CARE_PROVIDER_SITE_OTHER): Payer: Medicare Other | Admitting: Podiatry

## 2021-10-05 ENCOUNTER — Other Ambulatory Visit: Payer: Self-pay

## 2021-10-05 ENCOUNTER — Ambulatory Visit (INDEPENDENT_AMBULATORY_CARE_PROVIDER_SITE_OTHER): Payer: Medicare Other

## 2021-10-05 ENCOUNTER — Encounter: Payer: Self-pay | Admitting: Podiatry

## 2021-10-05 DIAGNOSIS — L603 Nail dystrophy: Secondary | ICD-10-CM

## 2021-10-05 DIAGNOSIS — M205X1 Other deformities of toe(s) (acquired), right foot: Secondary | ICD-10-CM

## 2021-10-05 DIAGNOSIS — M2041 Other hammer toe(s) (acquired), right foot: Secondary | ICD-10-CM

## 2021-10-05 DIAGNOSIS — M205X2 Other deformities of toe(s) (acquired), left foot: Secondary | ICD-10-CM

## 2021-10-05 DIAGNOSIS — M2042 Other hammer toe(s) (acquired), left foot: Secondary | ICD-10-CM

## 2021-10-05 DIAGNOSIS — B353 Tinea pedis: Secondary | ICD-10-CM

## 2021-10-05 MED ORDER — KETOCONAZOLE 2 % EX CREA: 1.0000 "application " | TOPICAL_CREAM | Freq: Every day | CUTANEOUS | 2 refills | Status: DC

## 2021-10-05 NOTE — Progress Notes (Signed)
?  Subjective:  ?Patient ID: Joshua [name], male    DOB: [birth year],   MRN: [id number] ? ?Chief Complaint  ?Patient presents with  ? Bunions  ? Nail Problem  ? Hammer Toe  ? ? ?61 y.o. male presents for bilateral bunions that have been bothering him for about a month. Relates he also has bilateral second hammertoes he is concerned about. Relates a thickened and discolored left second toenail as well as bruising in his left ankle.  Denies any treatment and denies pain only in his second toenail.  Denies any other pedal complaints. Denies n/v/f/c.  ? ?Past Medical History:  ?Diagnosis Date  ? Anxiety   ? Brain tumor ([place] [facility]) 02/20/2013  ? brain tumor removed in March 2014, Dr Donald Pore  ? Diffuse idiopathic skeletal hyperostosis 06/05/2013  ? Dizziness   ? Enlarged prostate   ? GERD (gastroesophageal reflux disease)   ? Headache(784.0)   ? scattered  ? High cholesterol   ? Hypertension   ? Malignant meningioma of meninges of brain ([place]) 03/07/2019  ? Meningioma ([place])   ? Meningioma, recurrent of brain ([facility]) 01/31/2019  ? Neuropathy 12/05/2019  ? Pre-diabetes   ? Rotator cuff tear   ? right  ? Seizures ([place])   ? 11/27/19 last sz 1 wk ago  ? ? ?Objective:  ?Physical Exam: ?Vascular: DP/PT pulses 2/4 bilateral. CFT <3 seconds. Normal hair growth on digits. No edema.  ?Skin. No lacerations or abrasions bilateral feet. Right second digit thickened discolored and elongated. Small eccymosis noted to lateral ankle.  ?Musculoskeletal: MMT 5/5 bilateral lower extremities in DF, PF, Inversion and Eversion. Deceased ROM in DF of ankle joint.  Bilateral spurring noted to first MPJ No pain to palpation. Bilateral ;hammered digits 2-5 with no pain. Right tailors bunion no pain to palpation and no pain with ROM.  ?Neurological: Sensation intact to light touch.  ? ?Assessment:  ? ?1. Hallux limitus, right   ?2. Hallux limitus, left   ?3. Onychodystrophy   ?4. Tinea pedis of both feet   ?5. Hammertoe, bilateral   ? ? ? ?Plan:  ?Patient was  evaluated and treated and all questions answered. ?-Xrays reviewed. No acute fractures or discloations but significant degenerative changes noted to the bilateral first MPJs.  ?-Discussed hallux limitus and  treatement options; conservative and  Surgical management; risks, benefits, alternatives discussed. All patient's questions answered. ?Patient denies much pain in this area so will monitor.  ?-Debrided second toe nail as courtesy if continues to have problems discussed we can do a procedure to removal nail. ?-Dicussed hammertoes and tailors bunion as well and padding and supportive shoe gear.  ?-For dry itchy skin ketoconzaole sent to pharmacy.  ?Patient to follow-up as needed.  ? ? ?Lorenda Peck, DPM  ? ? ?

## 2021-10-11 ENCOUNTER — Telehealth: Payer: Self-pay | Admitting: Orthopedic Surgery

## 2021-10-11 ENCOUNTER — Other Ambulatory Visit: Payer: Self-pay | Admitting: Internal Medicine

## 2021-10-11 NOTE — Telephone Encounter (Signed)
Mr. Piascik has an appointment to follow up with Dr. Marlou Sa on June 9th, but in the meantime he states that he needs to be doing physical therapy on his shoulder.  He would like an Rx for water therapy at the [facility] (he is currently doing this on his own, but would like not to pay out of pocket for the [facility] fee).  Please call patient to discuss. Thank you! ?

## 2021-10-11 NOTE — Telephone Encounter (Signed)
Do they have official h20 pt therapy?

## 2021-10-12 NOTE — Telephone Encounter (Signed)
I have tried calling patient to discuss. No answer. LM for patient to call back. Per Dr Forbes Cellar request we need to know which [facility] he goes to so that we can call them to inquire on whether or not they do formal water physical therapy. If no, we will not be able to provide order. ?

## 2021-10-28 ENCOUNTER — Other Ambulatory Visit: Payer: Self-pay

## 2021-10-28 ENCOUNTER — Encounter (HOSPITAL_COMMUNITY): Payer: Self-pay | Admitting: Radiology

## 2021-10-28 ENCOUNTER — Emergency Department (HOSPITAL_COMMUNITY): Payer: Medicare Other

## 2021-10-28 ENCOUNTER — Emergency Department (HOSPITAL_COMMUNITY)
Admission: EM | Admit: 2021-10-28 | Discharge: 2021-10-28 | Disposition: A | Discharge status: Home / Self Care | Payer: Medicare Other | Attending: Emergency Medicine | Admitting: Emergency Medicine

## 2021-10-28 DIAGNOSIS — I10 Essential (primary) hypertension: Secondary | ICD-10-CM | POA: Diagnosis not present

## 2021-10-28 DIAGNOSIS — S299XXA Unspecified injury of thorax, initial encounter: Secondary | ICD-10-CM | POA: Diagnosis not present

## 2021-10-28 DIAGNOSIS — Y92410 Unspecified street and highway as the place of occurrence of the external cause: Secondary | ICD-10-CM | POA: Diagnosis not present

## 2021-10-28 DIAGNOSIS — Z79899 Other long term (current) drug therapy: Secondary | ICD-10-CM | POA: Insufficient documentation

## 2021-10-28 DIAGNOSIS — Z7982 Long term (current) use of aspirin: Secondary | ICD-10-CM | POA: Diagnosis not present

## 2021-10-28 DIAGNOSIS — S199XXA Unspecified injury of neck, initial encounter: Secondary | ICD-10-CM | POA: Diagnosis present

## 2021-10-28 DIAGNOSIS — S161XXA Strain of muscle, fascia and tendon at neck level, initial encounter: Secondary | ICD-10-CM | POA: Diagnosis not present

## 2021-10-28 MED ORDER — METHOCARBAMOL 500 MG PO TABS: 500.0000 mg | ORAL_TABLET | Freq: Two times a day (BID) | ORAL | 0 refills | Status: DC

## 2021-10-28 MED ORDER — HYDROCODONE-ACETAMINOPHEN 5-325 MG PO TABS
1.0000 | ORAL_TABLET | Freq: Once | ORAL | Status: AC
  Administered 2021-10-28: 1 via ORAL
  Filled 2021-10-28: qty 1

## 2021-10-28 MED ORDER — IBUPROFEN 800 MG PO TABS
800.0000 mg | ORAL_TABLET | Freq: Once | ORAL | Status: AC
Start: 2021-10-28 — End: 2021-10-28
  Administered 2021-10-28: 800 mg via ORAL
  Filled 2021-10-28: qty 1
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
## 2021-10-29 ENCOUNTER — Telehealth: Payer: Self-pay

## 2021-10-29 ENCOUNTER — Other Ambulatory Visit: Payer: Self-pay | Admitting: Internal Medicine

## 2021-10-29 DIAGNOSIS — C700 Malignant neoplasm of cerebral meninges: Secondary | ICD-10-CM

## 2021-10-29 DIAGNOSIS — R569 Unspecified convulsions: Secondary | ICD-10-CM

## 2021-10-29 NOTE — Telephone Encounter (Signed)
Transition Care Management Unsuccessful Follow-up Telephone Call ? ?Date of discharge and from where:  10/28/2021-[facility]  ? ?Attempts:  1st Attempt ? ?Reason for unsuccessful TCM follow-up call:  Left voice message ? ?  ?

## 2021-11-02 NOTE — Telephone Encounter (Signed)
Transition Care Management Unsuccessful Follow-up Telephone Call ? ?Date of discharge and from where:  10/28/2021-[facility]  ? ?Attempts:  2nd Attempt ? ?Reason for unsuccessful TCM follow-up call:  Left voice message ? ?  ?

## 2021-11-03 NOTE — Telephone Encounter (Signed)
Transition Care Management Unsuccessful Follow-up Telephone Call ? ?Date of discharge and from where:  10/28/2021-[facility]  ? ?Attempts:  3rd Attempt ? ?Reason for unsuccessful TCM follow-up call:  Left voice message ? ?  ?

## 2021-11-15 ENCOUNTER — Other Ambulatory Visit: Payer: Self-pay

## 2021-11-15 DIAGNOSIS — N401 Enlarged prostate with lower urinary tract symptoms: Secondary | ICD-10-CM

## 2021-11-22 ENCOUNTER — Other Ambulatory Visit: Payer: Self-pay

## 2021-11-22 NOTE — Progress Notes (Signed)
? ? ?11/23/2021 ?11:31 AM  ? ?Joshua [name] ?15-Feb-1961 ?[id number] ? ?Referring provider: Charlott Rakes, MD ?[address] ?[place],  [place] [address] ? ?Urological history: ?1. 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 ? ?Chief Complaint  ?Patient presents with  ? Follow-up  ? Benign Prostatic Hypertrophy  ? ? ?HPI: ?Joshua [name] is a 61 y.o. male who presents today for a yearly visit. ? ?He has switched from taking the tamsulosin during the day to taking it at night due to dizziness.  Patient denies any modifying or aggravating factors.  Patient denies any gross hematuria, dysuria or suprapubic/flank pain.  Patient denies any fevers, chills, nausea or vomiting.   ? ? IPSS   ? ? [place] Name 11/23/21 1100  ?  ?  ?  ? International Prostate Symptom Score  ? How often have you had the sensation of not emptying your bladder? Not at All    ? How often have you had to urinate less than every two hours? Less than 1 in 5 times    ? How often have you found you stopped and started again several times when you urinated? Less than 1 in 5 times    ? How often have you found it difficult to postpone urination? Not at All    ? How often have you had a weak urinary stream? Less than 1 in 5 times    ? How often have you had to strain to start urination? Less than 1 in 5 times    ? How many times did you typically get up at night to urinate? 1 Time    ? Total IPSS Score 5    ?  ? Quality of Life due to urinary symptoms  ? If you were to spend the rest of your life with your urinary condition just the way it is now how would you feel about that? Pleased    ? ?  ?  ? ?  ? ? ?Score:  ?1-7 Mild ?8-19 Moderate ?20-35 Severe    ? ?PMH: ?Past Medical History:  ?Diagnosis Date  ? Anxiety   ? Brain tumor ([facility]) 02/20/2013  ?  brain tumor removed in March 2014, Dr Donald Pore  ? Diffuse idiopathic skeletal hyperostosis 06/05/2013  ? Dizziness   ? Enlarged prostate   ? GERD (gastroesophageal reflux disease)   ? Headache(784.0)   ? scattered  ? High cholesterol   ? Hypertension   ? Malignant meningioma of meninges of brain ([facility]) 03/07/2019  ? Meningioma ([place])   ? Meningioma, recurrent of brain ([facility]) 01/31/2019  ? Neuropathy 12/05/2019  ? Pre-diabetes   ? Rotator cuff tear   ? right  ? Seizures ([facility])   ? 11/27/19 last sz 1 wk ago  ? ? ?Surgical History: ?Past Surgical History:  ?Procedure Laterality Date  ? APPLICATION OF CRANIAL NAVIGATION N/A 01/10/2019  ? Procedure: APPLICATION OF CRANIAL NAVIGATION;  Surgeon: Erline Levine, MD;  Location: [facility];  Service: Neurosurgery;  Laterality: N/A;  ? CATARACT EXTRACTION Right   ? CRANIOTOMY Right 11/06/2012  ? Procedure: CRANIOTOMY TUMOR EXCISION;  Surgeon: Erline Levine, MD;  Location: [facility]UR[facility];  Service: Neurosurgery;  Laterality: Right;  Right Parasagittal craniotomy for meningioma with [place]  ? CRANIOTOMY Right 01/10/2019  ? Procedure: Right  Parasagittal Craniotomy for Tumor;  Surgeon: Erline Levine, MD;  Location: [facility];  Service: Neurosurgery;  Laterality: Right;  Right parasagittal craniotomy for tumor  ? ESOPHAGOGASTRODUODENOSCOPY    ? JOINT REPLACEMENT Right 2018  ? right hip  ? REVERSE SHOULDER ARTHROPLASTY Right 01/08/2021  ? Procedure: RIGHT REVERSE SHOULDER ARTHROPLASTY;  Surgeon: Meredith Pel, MD;  Location: [facility];  Service: Orthopedics;  Laterality: Right;  ? right knee arthroscopy    ? SHOULDER ARTHROSCOPY Right 06/09/2020  ? Procedure: RIGHT SHOULDER ARTHROSCOPY AND DEBRIDEMENT;  Surgeon: Newt Minion, MD;  Location: [facility];  Service: Orthopedics;  Laterality: Right;  ? ? ?Home Medications:  ?Allergies as of 11/23/2021   ?No Known Allergies ?  ? ?  ?Medication List  ?  ? ?  ? Accurate as of Nov 23, 2021 11:31 AM. If you have any questions, ask your nurse or  doctor.  ?  ?  ? ?  ? ?Accu-Chek Guide Me w/Device Kit ?Use to check blood sugar once daily. ?  ?Accu-Chek Guide test strip ?Generic drug: glucose blood ?Use to check blood sugar once daily. ?  ?Accu-Chek Softclix Lancets lancets ?Use as instructed ?  ?acetaminophen 500 MG tablet ?Commonly known as: TYLENOL ?Take 500 mg by mouth every 6 (six) hours as needed for moderate pain or headache. ?  ?albuterol 108 (90 Base) MCG/ACT inhaler ?Commonly known as: VENTOLIN HFA ?Inhale 2 puffs into the lungs every 6 (six) hours as needed for wheezing or shortness of breath. ?  ?amLODipine 5 MG tablet ?Commonly known as: NORVASC ?TAKE 1 TABLET (5 MG TOTAL) BY MOUTH DAILY. TO LOWER BLOOD PRESSURE. STOP LOSARTAN ?  ?aspirin 81 MG chewable tablet ?Chew 81 mg by mouth daily. ?  ?atorvastatin 20 MG tablet ?Commonly known as: LIPITOR ?Take 1 tablet (20 mg total) by mouth daily. ?  ?cetirizine 10 MG tablet ?Commonly known as: ZYRTEC ?Take 1 tablet (10 mg total) by mouth daily. ?  ?clonazePAM 0.5 MG tablet ?Commonly known as: KLONOPIN ?Take 1 tablet (0.5 mg total) by mouth at bedtime. ?  ?diazepam 2 MG tablet ?Commonly known as: VALIUM ?Take 1 tablet (2 mg total) by mouth 2 (two) times daily. ?  ?docusate sodium 100 MG capsule ?Commonly known as: COLACE ?Take 1 capsule (100 mg total) by mouth 2 (two) times daily. ?  ?fluticasone 50 MCG/ACT nasal spray ?Commonly known as: FLONASE ?Place 2 sprays into both nostrils daily. ?What changed:  ?when to take this ?reasons to take this ?  ?hydrocortisone 2.5 % rectal cream ?Commonly known as: ANUSOL-HC ?Place 1 application rectally 2 (two) times daily. ?  ?ketoconazole 2 % cream ?Commonly known as: NIZORAL ?Apply 1 application. topically daily. ?  ?Lacosamide 100 MG Tabs ?TAKE 1 TABLET (100 MG TOTAL) BY MOUTH IN THE MORNING AND AT BEDTIME. ?  ?lamoTRIgine 100 MG tablet ?Commonly known as: LAMICTAL ?TAKE 2 TABLETS (200 MG TOTAL) BY MOUTH 2 (TWO) TIMES DAILY. ?  ?meclizine 25 MG tablet ?Commonly  known as: ANTIVERT ?Take 1 tablet (25 mg total) by mouth 3 (three) times daily as needed for dizziness. ?  ?meloxicam 15 MG tablet ?Commonly known as: Mobic ?1 po q day prn pain ?  ?metFORMIN 500 MG tablet ?Commonly known as: GLUCOPHAGE ?Take 1 tablet (500 mg total) by mouth 2 (two) times daily. ?  ?methocarbamol 500 MG tablet ?Commonly known as: ROBAXIN ?Take 1 tablet (500 mg total) by mouth 2 (two) times daily. ?  ?Misc. Devices Misc ?Please  provide BP cuff for patient ?  ?pantoprazole 20 MG tablet ?Commonly known as: Protonix ?Take 1 tablet (20 mg total) by mouth 2 (two) times daily before a meal. ?  ?tamsulosin 0.4 MG Caps capsule ?Commonly known as: FLOMAX ?Take 1 capsule (0.4 mg total) by mouth daily. ?  ?VITAMIN A PO ?Take 1 capsule by mouth daily. ?  ? ?  ? ? ?Allergies: No Known Allergies ? ?Family History: ?Family History  ?Problem Relation Age of Onset  ? Hypertension Mother   ? Dementia Mother   ? Diabetes Mother   ? Hypertension Father   ? CAD Father   ? Diabetes Father   ? CAD Brother   ? ? ?Social History:  reports that he quit smoking about 2 years ago. His smoking use included cigarettes. He has never used smokeless tobacco. He reports that he does not currently use alcohol. He reports that he does not use drugs. ? ?ROS: ?Pertinent ROS in HPI ? ?Physical Exam: ?BP 128/80   Pulse 75   Ht 5' 9"  (1.753 m)   Wt 189 lb (85.7 kg)   BMI 27.91 kg/m?   ?Constitutional:  Well nourished. Alert and oriented, No acute distress. ?HEENT: [place] AT, moist mucus membranes.  Trachea midline, no masses. ?Cardiovascular: No clubbing, cyanosis, or edema. ?Respiratory: Normal respiratory effort, no increased work of breathing. ?GI: Abdomen is soft, non tender, non distended, no abdominal masses. Liver and spleen not palpable.  No hernias appreciated.  Stool sample for occult testing is not indicated.   ?GU: No CVA tenderness.  No bladder fullness or masses.  Patient with uncircumcised phallus.  Foreskin easily retracted   Urethral meatus is patent.  No penile discharge. No penile lesions or rashes. Scrotum without lesions, cysts, rashes and/or edema.  Testicles are located scrotally bilaterally. No masses are appreciated in t

## 2021-11-23 ENCOUNTER — Encounter: Payer: Self-pay | Admitting: Urology

## 2021-11-23 ENCOUNTER — Ambulatory Visit (INDEPENDENT_AMBULATORY_CARE_PROVIDER_SITE_OTHER): Payer: Medicare Other | Admitting: Urology

## 2021-11-23 VITALS — BP 128/80 | HR 75 | Ht 69.0 in | Wt 189.0 lb

## 2021-11-23 DIAGNOSIS — R351 Nocturia: Secondary | ICD-10-CM

## 2021-11-23 DIAGNOSIS — N138 Other obstructive and reflux uropathy: Secondary | ICD-10-CM | POA: Diagnosis not present

## 2021-11-23 DIAGNOSIS — N401 Enlarged prostate with lower urinary tract symptoms: Secondary | ICD-10-CM

## 2021-11-23 LAB — BLADDER SCAN AMB NON-IMAGING

## 2021-11-23 MED ORDER — TAMSULOSIN HCL 0.4 MG PO CAPS: 0.4000 mg | ORAL_CAPSULE | Freq: Every day | ORAL | 3 refills | Status: DC

## 2021-11-23 NOTE — Patient Instructions (Signed)
UroLift and HoLEP  ?

## 2021-11-26 ENCOUNTER — Ambulatory Visit: Payer: Medicaid Other | Admitting: Urology

## 2021-11-26 ENCOUNTER — Ambulatory Visit: Payer: Self-pay | Admitting: Physician Assistant

## 2021-11-30 ENCOUNTER — Encounter (HOSPITAL_COMMUNITY): Payer: Self-pay

## 2021-11-30 ENCOUNTER — Other Ambulatory Visit: Payer: Self-pay

## 2021-11-30 ENCOUNTER — Emergency Department (HOSPITAL_COMMUNITY): Payer: Medicare Other

## 2021-11-30 ENCOUNTER — Emergency Department (HOSPITAL_COMMUNITY)
Admission: EM | Admit: 2021-11-30 | Discharge: 2021-11-30 | Disposition: A | Discharge status: Home / Self Care | Payer: Medicare Other | Attending: Emergency Medicine | Admitting: Emergency Medicine

## 2021-11-30 DIAGNOSIS — Z7984 Long term (current) use of oral hypoglycemic drugs: Secondary | ICD-10-CM | POA: Insufficient documentation

## 2021-11-30 DIAGNOSIS — R110 Nausea: Secondary | ICD-10-CM | POA: Diagnosis not present

## 2021-11-30 DIAGNOSIS — Z7982 Long term (current) use of aspirin: Secondary | ICD-10-CM | POA: Diagnosis not present

## 2021-11-30 DIAGNOSIS — R5383 Other fatigue: Secondary | ICD-10-CM | POA: Diagnosis not present

## 2021-11-30 DIAGNOSIS — Z20822 Contact with and (suspected) exposure to covid-19: Secondary | ICD-10-CM | POA: Insufficient documentation

## 2021-11-30 DIAGNOSIS — R531 Weakness: Secondary | ICD-10-CM | POA: Diagnosis not present

## 2021-11-30 DIAGNOSIS — R42 Dizziness and giddiness: Secondary | ICD-10-CM | POA: Insufficient documentation

## 2021-11-30 DIAGNOSIS — M791 Myalgia, unspecified site: Secondary | ICD-10-CM | POA: Insufficient documentation

## 2021-11-30 LAB — URINALYSIS, ROUTINE W REFLEX MICROSCOPIC
Bacteria, UA: NONE SEEN
Bilirubin Urine: NEGATIVE
Glucose, UA: NEGATIVE mg/dL
Hgb urine dipstick: NEGATIVE
Ketones, ur: NEGATIVE mg/dL
Leukocytes,Ua: NEGATIVE
Nitrite: NEGATIVE
Protein, ur: 30 mg/dL — AB
Specific Gravity, Urine: 1.021 (ref 1.005–1.030)
pH: 5 (ref 5.0–8.0)

## 2021-11-30 LAB — RESP PANEL BY RT-PCR (FLU A&B, COVID) ARPGX2
Influenza A by PCR: NEGATIVE
Influenza B by PCR: NEGATIVE
SARS Coronavirus 2 by RT PCR: NEGATIVE

## 2021-11-30 NOTE — Discharge Instructions (Signed)
Return for any problem.  ? ?Drink plenty of fluids.  Get plenty of rest.  You can use Tylenol or Motrin as needed for symptoms. ? ?If you feel worse over the next several days please follow-up closely with your PCP or return to the ED. ? ? ?

## 2021-11-30 NOTE — ED Triage Notes (Signed)
Pt states that he feels as if he has flu like symptoms (weakness, dizziness, nausea). Pt states that he was eating out a few days ago and someone was coughing near him.  ?

## 2021-11-30 NOTE — ED Provider Notes (Signed)
?[facility] DEPT ?Provider Note ? ? ?CSN: [id number] ?Arrival date & time: 11/30/21  1942 ? ?  ? ?History ? ?Chief Complaint  ?Patient presents with  ? Nausea  ? Weakness  ? Dizziness  ? ? ?Joshua [name] is a 61 y.o. male. ? ?61 year old male with prior medical history as detailed below presents for evaluation.  Patient reports that last night he ate at the [facility].  While he was having his meal another restaurant patient was behind him and was "coughing all over the place." ? ?Patient reports that this morning he woke up and felt like he might be "catching the flu." ? ?He complains of diffuse mild myalgia and fatigue.  Reports some intermittent nausea. ? ?He denies fever.  He denies cough or shortness of breath. ? ?He denies prior COVID infection.  He reports that he is status post vaccination for COVID. ? ?The history is provided by the patient and medical records.  ?Illness ?Location:  Myalgia, malaise, weakness ?Severity:  Mild ?Onset quality:  Gradual ?Duration:  1 day ?Timing:  Rare ?Progression:  Waxing and waning ?Chronicity:  New ? ?  ? ?Home Medications ?Prior to Admission medications   ?Medication Sig Start Date End Date Taking? Authorizing Provider  ?Accu-Chek Softclix Lancets lancets Use as instructed 08/13/21   Charlott Rakes, MD  ?acetaminophen (TYLENOL) 500 MG tablet Take 500 mg by mouth every 6 (six) hours as needed for moderate pain or headache.    [provider]  ?albuterol (VENTOLIN HFA) 108 (90 Base) MCG/ACT inhaler Inhale 2 puffs into the lungs every 6 (six) hours as needed for wheezing or shortness of breath. 08/13/21   Charlott Rakes, MD  ?amLODipine (NORVASC) 5 MG tablet TAKE 1 TABLET (5 MG TOTAL) BY MOUTH DAILY. TO LOWER BLOOD PRESSURE. STOP LOSARTAN 08/13/21   Charlott Rakes, MD  ?aspirin 81 MG chewable tablet Chew 81 mg by mouth daily.    [provider]  ?atorvastatin (LIPITOR) 20 MG tablet Take 1 tablet (20 mg total) by mouth  daily. 08/13/21   Charlott Rakes, MD  ?Blood Glucose Monitoring Suppl (ACCU-CHEK GUIDE ME) w/Device KIT Use to check blood sugar once daily. 01/07/21   Charlott Rakes, MD  ?cetirizine (ZYRTEC) 10 MG tablet Take 1 tablet (10 mg total) by mouth daily. ?Patient not taking: Reported on 11/23/2021 07/27/21   Charlott Rakes, MD  ?clonazePAM (KLONOPIN) 0.5 MG tablet Take 1 tablet (0.5 mg total) by mouth at bedtime. 09/27/21   [name], MD  ?diazepam (VALIUM) 2 MG tablet Take 1 tablet (2 mg total) by mouth 2 (two) times daily. 08/15/21   Valarie Merino, MD  ?docusate sodium (COLACE) 100 MG capsule Take 1 capsule (100 mg total) by mouth 2 (two) times daily. 01/09/21   Meredith Pel, MD  ?fluticasone Asencion Islam) 50 MCG/ACT nasal spray Place 2 sprays into both nostrils daily. ?Patient taking differently: Place 2 sprays into both nostrils daily as needed for allergies. 07/27/21   Charlott Rakes, MD  ?glucose blood (ACCU-CHEK GUIDE) test strip Use to check blood sugar once daily. 08/13/21   Charlott Rakes, MD  ?hydrocortisone (ANUSOL-HC) 2.5 % rectal cream Place 1 application rectally 2 (two) times daily. 08/22/21   Lacretia Leigh, MD  ?ketoconazole (NIZORAL) 2 % cream Apply 1 application. topically daily. 10/05/21   Lorenda Peck, DPM  ?Lacosamide 100 MG TABS TAKE 1 TABLET (100 MG TOTAL) BY MOUTH IN THE MORNING AND AT BEDTIME. 10/11/21   Vaslow, Acey Lav,  MD  ?lamoTRIgine (LAMICTAL) 100 MG tablet TAKE 2 TABLETS (200 MG TOTAL) BY MOUTH 2 (TWO) TIMES DAILY. 10/29/21   [name], MD  ?meclizine (ANTIVERT) 25 MG tablet Take 1 tablet (25 mg total) by mouth 3 (three) times daily as needed for dizziness. 09/16/21   [name], PA-C  ?meloxicam (MOBIC) 15 MG tablet 1 po q day prn pain 09/03/21   Meredith Pel, MD  ?metFORMIN (GLUCOPHAGE) 500 MG tablet Take 1 tablet (500 mg total) by mouth 2 (two) times daily. 08/13/21   Charlott Rakes, MD  ?methocarbamol (ROBAXIN) 500 MG tablet Take 1 tablet (500 mg total) by  mouth 2 (two) times daily. 10/28/21   Prosperi, Darrick Meigs H, PA-C  ?Misc. Devices MISC Please provide BP cuff for patient 07/08/20   Argentina Donovan, PA-C  ?pantoprazole (PROTONIX) 20 MG tablet Take 1 tablet (20 mg total) by mouth 2 (two) times daily before a meal. ?Patient not taking: Reported on 11/23/2021 08/13/21 08/13/22  Charlott Rakes, MD  ?tamsulosin (FLOMAX) 0.4 MG CAPS capsule Take 1 capsule (0.4 mg total) by mouth daily. 11/23/21   Zara Council A, PA-C  ?VITAMIN A PO Take 1 capsule by mouth daily.    [provider]  ?   ? ?Allergies    ?Patient has no known allergies.   ? ?Review of Systems   ?Review of Systems  ?All other systems reviewed and are negative. ? ?Physical Exam ?Updated Vital Signs ?BP (!) 140/91 (BP Location: Left Arm)   Pulse (!) 102   Temp 98.2 ?F (36.8 ?C) (Oral)   Resp 16   SpO2 94%  ?Physical Exam ?Vitals and nursing note reviewed.  ?Constitutional:   ?   General: He is not in acute distress. ?   Appearance: Normal appearance. He is well-developed.  ?HENT:  ?   Head: Normocephalic and atraumatic.  ?Eyes:  ?   Conjunctiva/sclera: Conjunctivae normal.  ?   Pupils: Pupils are equal, round, and reactive to light.  ?Cardiovascular:  ?   Rate and Rhythm: Normal rate and regular rhythm.  ?   Heart sounds: Normal heart sounds.  ?Pulmonary:  ?   Effort: Pulmonary effort is normal. No respiratory distress.  ?   Breath sounds: Normal breath sounds.  ?Abdominal:  ?   General: There is no distension.  ?   Palpations: Abdomen is soft.  ?   Tenderness: There is no abdominal tenderness.  ?Musculoskeletal:     ?   General: No deformity. Normal range of motion.  ?   Cervical back: Normal range of motion and neck supple.  ?Skin: ?   General: Skin is warm and dry.  ?Neurological:  ?   General: No focal deficit present.  ?   Mental Status: He is alert and oriented to person, place, and time.  ? ? ?ED Results / Procedures / Treatments   ?Labs ?(all labs ordered are listed, but only abnormal  results are displayed) ?Labs Reviewed  ?RESP PANEL BY RT-PCR (FLU A&B, COVID) [id number]  ?URINALYSIS, ROUTINE W REFLEX MICROSCOPIC  ? ? ?EKG ?EKG Interpretation ? ?Date/Time:  Tuesday Nov 30 2021 20:05:13 EDT ?Ventricular Rate:  91 ?PR Interval:  160 ?QRS Duration: 111 ?QT Interval:  358 ?QTC Calculation: 441 ?R Axis:   144 ?Text Interpretation: Sinus rhythm Consider right ventricular hypertrophy Borderline ST elevation, anterior leads Confirmed by Dene Gentry [phone]) on 11/30/2021 8:34:31 PM ? ?Radiology ?No results found. ? ?Procedures ?Procedures  ? ? ?Medications Ordered in  ED ?Medications - No data to display ? ?ED Course/ Medical Decision Making/ A&P ?  ?                        ?Medical Decision Making ?Amount and/or Complexity of Data Reviewed ?Labs: ordered. ?Radiology: ordered. ? ? ? ?Medical Screen Complete ? ?This patient presented to the ED with complaint of malaise, fatigue, possible viral infection. ? ?This complaint involves an extensive number of treatment options. The initial differential diagnosis includes, but is not limited to, viral versus bacterial infection, etc ? ?This presentation is: Acute, Self-Limited, Previously Undiagnosed, Uncertain Prognosis, Complicated, and Systemic Symptoms ? ?Patient is presenting with complaint about malaise and fatigue.  Patient reports that he is concerned about possible early viral syndrome. ? ?Patient without fever. ? ?Patient without chest pain, shortness of breath, nausea, vomiting, other acute complaint. ? ?COVID and flu testing today is negative. ? ?Chest x-ray is without acute pathology. ? ?UA is without significant abnormality. ? ?Patient reassured by evaluation in ED.  She does understand need for close outpatient follow-up.  Strict return precautions given and understood. ? ? ? ? ?Additional history obtained: ? ?External records from outside sources obtained and reviewed including prior ED visits and prior Inpatient records.  ? ? ?Lab Tests: ? ?I  ordered and personally interpreted labs.  The pertinent results include: COVID, flu, UA ? ? ?Imaging Studies ordered: ? ?I ordered imaging studies including chest x-ray ?I independently visualized and interpreted obt

## 2021-11-30 NOTE — ED Notes (Signed)
I provided reinforced discharge education based off of discharge instructions. Pt acknowledged and understood my education. Pt had no further questions/concerns for provider/myself.  °

## 2021-12-01 ENCOUNTER — Telehealth: Payer: Self-pay

## 2021-12-01 NOTE — Telephone Encounter (Signed)
Transition Care Management Follow-up Telephone Call ?Date of discharge and from where: 11/30/2021 from [facility] ?How have you been since you were released from the hospital? Patient stated that he is feeling better. Patient did not have any questions or concerns at this time.  ?Any questions or concerns? No ? ?Items Reviewed: ?Did the pt receive and understand the discharge instructions provided? Yes  ?Medications obtained and verified? Yes  ?Other? No  ?Any new allergies since your discharge? No  ?Dietary orders reviewed? No ?Do you have support at home? Yes  ? ?Functional Questionnaire: (I = Independent and D = Dependent) ?ADLs: I ? ?Bathing/Dressing- I ? ?Meal Prep- I ? ?Eating- I ? ?Maintaining continence- I ? ?Transferring/Ambulation- I ? ?Managing Meds- I ? ? ?Follow up appointments reviewed: ? ?PCP Hospital f/u appt confirmed? No  ?Specialist Hospital f/u appt confirmed? No   ?Are transportation arrangements needed? No  ?If their condition worsens, is the pt aware to call PCP or go to the Emergency Dept.? Yes ?Was the patient provided with contact information for the PCP's office or ED? Yes ?Was to pt encouraged to call back with questions or concerns? Yes ? ?

## 2021-12-01 NOTE — Telephone Encounter (Signed)
Left pt voicemail regarding his Disability parking form, that has been signed by Dr. Mickeal Skinner. That it is ready for pickup and gave a callback # [phone] if he has any questions or concerns.   ?

## 2021-12-08 ENCOUNTER — Other Ambulatory Visit: Payer: Self-pay | Admitting: Internal Medicine

## 2021-12-08 ENCOUNTER — Other Ambulatory Visit: Payer: Self-pay | Admitting: Family Medicine

## 2021-12-08 DIAGNOSIS — C700 Malignant neoplasm of cerebral meninges: Secondary | ICD-10-CM

## 2021-12-08 DIAGNOSIS — R569 Unspecified convulsions: Secondary | ICD-10-CM

## 2021-12-08 DIAGNOSIS — I700 Atherosclerosis of aorta: Secondary | ICD-10-CM

## 2021-12-14 ENCOUNTER — Encounter: Payer: Self-pay | Admitting: Cardiology

## 2021-12-14 ENCOUNTER — Ambulatory Visit (INDEPENDENT_AMBULATORY_CARE_PROVIDER_SITE_OTHER): Payer: Medicare Other | Admitting: Cardiology

## 2021-12-14 VITALS — BP 110/60 | HR 92 | Ht 69.0 in | Wt 192.2 lb

## 2021-12-14 DIAGNOSIS — R002 Palpitations: Secondary | ICD-10-CM

## 2021-12-14 DIAGNOSIS — Z8249 Family history of ischemic heart disease and other diseases of the circulatory system: Secondary | ICD-10-CM

## 2021-12-14 DIAGNOSIS — I10 Essential (primary) hypertension: Secondary | ICD-10-CM

## 2021-12-14 DIAGNOSIS — E7800 Pure hypercholesterolemia, unspecified: Secondary | ICD-10-CM | POA: Diagnosis not present

## 2021-12-14 DIAGNOSIS — I700 Atherosclerosis of aorta: Secondary | ICD-10-CM

## 2021-12-14 NOTE — Addendum Note (Signed)
Addended by: Antonieta Iba on: 12/14/2021 01:08 PM   Modules accepted: Orders

## 2021-12-14 NOTE — Progress Notes (Signed)
Cardiology Office Note    Date:  12/14/2021   ID:  Joshua [name], DOB 18-Jul-1961, MRN [id number]  PCP:  Charlott Rakes, MD  Cardiologist:  Fransico Him, MD   Chief Complaint  Patient presents with   Follow-up    Aortic atherosclerosis, hypertension, hyperlipidemia, palpitations    History of Present Illness:  Joshua [name] is a 61 y.o. male with a hx of malignant meningioma s/p resection 2014 and reoccurence 12/2018 s/p resection with seizures and recent dx of aortic atherosclerosis done at time of workup of Neurologic symptoms.  This was noted Ct angio of neck.    He is here today for followup and is doing well.  He has some ? COPD and his PCP gave him an inhaler for SOB that has helped.  He denies any chest pain or pressure, PND, orthopnea, LE edema, dizziness or syncope. He does admit to getting a fluttering sensation from time to time that usually lasts a few seconds and resolves. He is compliant with his meds and is tolerating meds with no SE.     Past Medical History:  Diagnosis Date   Anxiety    Brain tumor ([place]) 02/20/2013   brain tumor removed in March 2014, Dr Donald Pore   Diffuse idiopathic skeletal hyperostosis 06/05/2013   Dizziness    Enlarged prostate    GERD (gastroesophageal reflux disease)    Headache(784.0)    scattered   High cholesterol    Hypertension    Malignant meningioma of meninges of brain ([place]) 03/07/2019   Meningioma ([place])    Meningioma, recurrent of brain ([place]) 01/31/2019   Neuropathy 12/05/2019   Pre-diabetes    Rotator cuff tear    right   Seizures ([place])    11/27/19 last sz 1 wk ago    Past Surgical History:  Procedure Laterality Date   APPLICATION OF CRANIAL NAVIGATION N/A 01/10/2019   Procedure: APPLICATION OF CRANIAL NAVIGATION;  Surgeon: Erline Levine, MD;  Location: [facility];  Service: Neurosurgery;  Laterality: N/A;   CATARACT EXTRACTION Right    CRANIOTOMY Right 11/06/2012   Procedure: CRANIOTOMY TUMOR EXCISION;  Surgeon:  Erline Levine, MD;  Location: [place];  Service: Neurosurgery;  Laterality: Right;  Right Parasagittal craniotomy for meningioma with Stealth   CRANIOTOMY Right 01/10/2019   Procedure: Right Parasagittal Craniotomy for Tumor;  Surgeon: Erline Levine, MD;  Location: [place];  Service: Neurosurgery;  Laterality: Right;  Right parasagittal craniotomy for tumor   ESOPHAGOGASTRODUODENOSCOPY     JOINT REPLACEMENT Right 2018   right hip   REVERSE SHOULDER ARTHROPLASTY Right 01/08/2021   Procedure: RIGHT REVERSE SHOULDER ARTHROPLASTY;  Surgeon: Meredith Pel, MD;  Location: [place];  Service: Orthopedics;  Laterality: Right;   right knee arthroscopy     SHOULDER ARTHROSCOPY Right 06/09/2020   Procedure: RIGHT SHOULDER ARTHROSCOPY AND DEBRIDEMENT;  Surgeon: Newt Minion, MD;  Location: [place];  Service: Orthopedics;  Laterality: Right;    Current Medications: Current Meds  Medication Sig   Accu-Chek Softclix Lancets lancets Use as instructed   acetaminophen (TYLENOL) 500 MG tablet Take 500 mg by mouth every 6 (six) hours as needed for moderate pain or headache.   albuterol (VENTOLIN HFA) 108 (90 Base) MCG/ACT inhaler Inhale 2 puffs into the lungs every 6 (six) hours as needed for wheezing or shortness of breath.   amLODipine (NORVASC) 5 MG tablet TAKE 1 TABLET (5 MG TOTAL) BY MOUTH DAILY. TO LOWER BLOOD PRESSURE. STOP LOSARTAN  aspirin 81 MG chewable tablet Chew 81 mg by mouth daily.   atorvastatin (LIPITOR) 20 MG tablet TAKE 1 TABLET (20 MG TOTAL) BY MOUTH DAILY.   Blood Glucose Monitoring Suppl (ACCU-CHEK GUIDE ME) w/Device KIT Use to check blood sugar once daily.   cetirizine (ZYRTEC) 10 MG tablet Take 1 tablet (10 mg total) by mouth daily.   clonazePAM (KLONOPIN) 0.5 MG tablet Take 1 tablet (0.5 mg total) by mouth at bedtime.   diazepam (VALIUM) 2 MG tablet Take 1 tablet (2 mg total) by mouth 2 (two) times daily.   docusate sodium (COLACE) 100 MG capsule Take 1 capsule  (100 mg total) by mouth 2 (two) times daily.   fluticasone (FLONASE) 50 MCG/ACT nasal spray Place 2 sprays into both nostrils daily. (Patient taking differently: Place 2 sprays into both nostrils daily as needed for allergies.)   glucose blood (ACCU-CHEK GUIDE) test strip Use to check blood sugar once daily.   hydrocortisone (ANUSOL-HC) 2.5 % rectal cream Place 1 application rectally 2 (two) times daily.   ketoconazole (NIZORAL) 2 % cream Apply 1 application. topically daily.   Lacosamide 100 MG TABS TAKE 1 TABLET (100 MG TOTAL) BY MOUTH IN THE MORNING AND AT BEDTIME.   lamoTRIgine (LAMICTAL) 100 MG tablet TAKE 2 TABLETS (200 MG TOTAL) BY MOUTH 2 (TWO) TIMES DAILY.   meclizine (ANTIVERT) 25 MG tablet Take 1 tablet (25 mg total) by mouth 3 (three) times daily as needed for dizziness.   meloxicam (MOBIC) 15 MG tablet 1 po q day prn pain   metFORMIN (GLUCOPHAGE) 500 MG tablet Take 1 tablet (500 mg total) by mouth 2 (two) times daily.   methocarbamol (ROBAXIN) 500 MG tablet Take 1 tablet (500 mg total) by mouth 2 (two) times daily.   Misc. Devices MISC Please provide BP cuff for patient   pantoprazole (PROTONIX) 20 MG tablet Take 1 tablet (20 mg total) by mouth 2 (two) times daily before a meal. (Patient taking differently: Take 20 mg by mouth as needed.)   tamsulosin (FLOMAX) 0.4 MG CAPS capsule Take 1 capsule (0.4 mg total) by mouth daily.   VITAMIN A PO Take 1 capsule by mouth daily.    Allergies:   Patient has no known allergies.   Social History   Socioeconomic History   Marital status: Legally Separated    Spouse name: Not on file   Number of children: 2   Years of education: Not on file   Highest education level: Not on file  Occupational History   Not on file  Tobacco Use   Smoking status: Former    Types: Cigarettes    Quit date: 01/29/2019    Years since quitting: 2.8   Smokeless tobacco: Never   Tobacco comments:    weekend smoker  Vaping Use   Vaping Use: Never used   Substance and Sexual Activity   Alcohol use: Not Currently    Comment: social   Drug use: No   Sexual activity: Not Currently  Other Topics Concern   Not on file  Social History Narrative      Caffeine- soda occass   Social Determinants of Health   Financial Resource Strain: Not on file  Food Insecurity: Not on file  Transportation Needs: Not on file  Physical Activity: Not on file  Stress: Not on file  Social Connections: Not on file     Family History:  The patient's family history includes CAD in his brother and father; Dementia in his mother;  Diabetes in his father and mother; Hypertension in his father and mother.   ROS:   Please see the history of present illness.    ROS All other systems reviewed and are negative.      View : No data to display.             PHYSICAL EXAM:   VS:  BP 110/60   Pulse 92   Ht _0  (1.753 m)   Wt 192 lb 3.2 oz (87.2 kg)   SpO2 96%   BMI 28.38 kg/m    GEN: Well nourished, well developed in no acute distress HEENT: Normal NECK: No JVD; No carotid bruits LYMPHATICS: No lymphadenopathy CARDIAC:RRR, no murmurs, rubs, gallops RESPIRATORY:  Clear to auscultation without rales, wheezing or rhonchi  ABDOMEN: Soft, non-tender, non-distended MUSCULOSKELETAL:  No edema; No deformity  SKIN: Warm and dry NEUROLOGIC:  Alert and oriented x 3 PSYCHIATRIC:  Normal affect   Wt Readings from Last 3 Encounters:  12/14/21 192 lb 3.2 oz (87.2 kg)  11/23/21 189 lb (85.7 kg)  10/28/21 180 lb (81.6 kg)      Studies/Labs Reviewed:   EKG:  EKG is not ordered today.    Recent Labs: 03/31/2021: TSH 2.864 09/14/2021: ALT 25 09/17/2021: BUN 16; Creatinine, Ser 0.94; Hemoglobin 14.3; Platelets 206; Potassium 4.5; Sodium 139   Lipid Panel No results found for: CHOL, TRIG, HDL, CHOLHDL, VLDL, LDLCALC, LDLDIRECT  Additional studies/ records that were reviewed today include:  OV notes and CT of neck    ASSESSMENT:    1. Aortic  atherosclerosis ([place])   2. Pure hypercholesterolemia   3. Family history of premature CAD   4. Palpitations   5. Essential hypertension      PLAN:  In order of problems listed above:  1.  Aortic atherosclerosis -noted on Neck CT done for workup of neurologic sx.  -no evidence of carotic artery disease -this was not noted on a Chest CTA done 02/2019 -no coronary artery calcifications mentioned on CT -Continue aggressive risk factor modification of BP control  2.  HLD -followed by PCP -Check FLP and ALT -Continue prescription drug management with atorvastatin 20 mg daily with as needed refills  3.  Fm hx of premature CAD -he is asymptomatic but has a very strong fm hx of brothers and his father having MIs at an early age -he also has HTN, HLD and vascular disease of his aorta -Lexiscan Myoview 2021 showed no ischemia -Coronary calcium score 2021 was 0  4.  Palpitations -Event monitor 2021 was normal -He has not had any further palpitations -he is still having palpitations -I will repeat an event monitor  5.  Hypertension -BP is adequately controlled on exam today -Continue prescription drug management with amlodipine 5 mg daily with as needed refills    Medication Adjustments/Labs and Tests Ordered: Current medicines are reviewed at length with the patient today.  Concerns regarding medicines are outlined above.  Medication changes, Labs and Tests ordered today are listed in the Patient Instructions below.  There are no Patient Instructions on file for this visit.   Signed, Fransico Him, MD  12/14/2021 1:03 PM    [address], [place], [place]  [address] Phone: [phone]; Fax: [phone]

## 2021-12-14 NOTE — Patient Instructions (Addendum)
Medication Instructions:  Your physician recommends that you continue on your current medications as directed. Please refer to the Current Medication list given to you today.  *If you need a refill on your cardiac medications before your next appointment, please call your pharmacy*   Lab Work: Fasting Lipids and ALT    Testing/Procedures: Your physician has recommended that you wear an event monitor. Event monitors are medical devices that record the heart's electrical activity. Doctors most often [place] these monitors to diagnose arrhythmias. Arrhythmias are problems with the speed or rhythm of the heartbeat. The monitor is a small, portable device. You can wear one while you do your normal daily activities. This is usually used to diagnose what is causing palpitations/syncope (passing out).  Follow-Up: At [facility], you and your health needs are our priority.  As part of our continuing mission to provide you with exceptional heart care, we have created designated Provider Care Teams.  These Care Teams include your primary Cardiologist (physician) and Advanced Practice Providers (APPs -  Physician Assistants and Nurse Practitioners) who all work together to provide you with the care you need, when you need it.  Your next appointment:   1 year(s)  The format for your next appointment:   In Person  Provider:   Fransico Him, MD     Important Information About Sugar

## 2021-12-21 ENCOUNTER — Other Ambulatory Visit: Payer: Medicare Other

## 2021-12-24 ENCOUNTER — Ambulatory Visit: Payer: Medicare Other | Admitting: Orthopedic Surgery

## 2021-12-27 ENCOUNTER — Ambulatory Visit: Payer: Medicare Other | Admitting: Orthopedic Surgery

## 2021-12-27 ENCOUNTER — Other Ambulatory Visit: Payer: Medicare Other

## 2021-12-27 DIAGNOSIS — I10 Essential (primary) hypertension: Secondary | ICD-10-CM

## 2021-12-27 DIAGNOSIS — E7800 Pure hypercholesterolemia, unspecified: Secondary | ICD-10-CM

## 2021-12-27 DIAGNOSIS — Z8249 Family history of ischemic heart disease and other diseases of the circulatory system: Secondary | ICD-10-CM

## 2021-12-27 DIAGNOSIS — R002 Palpitations: Secondary | ICD-10-CM

## 2021-12-27 DIAGNOSIS — I700 Atherosclerosis of aorta: Secondary | ICD-10-CM

## 2021-12-27 LAB — LIPID PANEL
Chol/HDL Ratio: 1.8 ratio (ref 0.0–5.0)
Cholesterol, Total: 116 mg/dL (ref 100–199)
HDL: 64 mg/dL (ref >39)
LDL Chol Calc (NIH): 40 mg/dL (ref 0–99)
Triglycerides: 51 mg/dL (ref 0–149)
VLDL Cholesterol Cal: 12 mg/dL (ref 5–40)

## 2021-12-27 LAB — ALT: ALT: 22 IU/L (ref 0–44)

## 2021-12-30 ENCOUNTER — Telehealth: Payer: Self-pay | Admitting: Cardiology

## 2021-12-30 NOTE — Telephone Encounter (Signed)
Pt  returning nurses call regarding results. Please advse

## 2021-12-30 NOTE — Telephone Encounter (Signed)
See lab results notes ./cy 

## 2022-01-04 ENCOUNTER — Emergency Department (HOSPITAL_COMMUNITY): Payer: Medicare Other

## 2022-01-04 ENCOUNTER — Emergency Department (HOSPITAL_COMMUNITY)
Admission: EM | Admit: 2022-01-04 | Discharge: 2022-01-04 | Disposition: A | Discharge status: Home / Self Care | Payer: Medicare Other | Attending: Emergency Medicine | Admitting: Emergency Medicine

## 2022-01-04 ENCOUNTER — Other Ambulatory Visit: Payer: Self-pay

## 2022-01-04 ENCOUNTER — Encounter (HOSPITAL_COMMUNITY): Payer: Self-pay

## 2022-01-04 DIAGNOSIS — Z7982 Long term (current) use of aspirin: Secondary | ICD-10-CM | POA: Insufficient documentation

## 2022-01-04 DIAGNOSIS — R519 Headache, unspecified: Secondary | ICD-10-CM | POA: Diagnosis not present

## 2022-01-04 DIAGNOSIS — R42 Dizziness and giddiness: Secondary | ICD-10-CM | POA: Insufficient documentation

## 2022-01-04 MED ORDER — MECLIZINE HCL 25 MG PO TABS: 25.0000 mg | ORAL_TABLET | Freq: Three times a day (TID) | ORAL | 0 refills | Status: AC | PRN

## 2022-01-04 NOTE — Discharge Instructions (Addendum)
You have been seen and discharged from the emergency department.  Your head CT showed no acute changes for you.  Follow-up with your primary provider for further evaluation and further care. Take home medications as prescribed. If you have any worsening symptoms or further concerns for your health please return to an emergency department for further evaluation.

## 2022-01-04 NOTE — ED Triage Notes (Signed)
Pt reports having dizziness and head pains since last Thursday. Pt reports hearing a slush around the sides of his head. Pt reports prior brain surgeries. Pt also reports being prescribed medications for vertigo.

## 2022-01-04 NOTE — ED Provider Notes (Signed)
[facility] DEPT Provider Note   CSN: [id number] Arrival date & time: 01/04/22  1953     History  Chief Complaint  Patient presents with   Dizziness   Headache    Joshua [name] is a 61 y.o. male.  HPI  61 year old male with past medical history of previous, status post craniotomy, vertigo presents emergency department after more frequent headaches.  Patient states since his surgery to the meningiomas he frequently gets headaches.  However over the past week he has been getting them more frequently, located on the right side which is typical for him.  He has been having intermittent dizziness which feels like his previous diagnosis of vertigo.  He states he ran out of his meclizine has not been able to take it.  Denies any fevers, neck pain, neurodeficit, chest pain or shortness of breath.  Home Medications Prior to Admission medications   Medication Sig Start Date End Date Taking? Authorizing Provider  Accu-Chek Softclix Lancets lancets Use as instructed 08/13/21   Charlott Rakes, MD  acetaminophen (TYLENOL) 500 MG tablet Take 500 mg by mouth every 6 (six) hours as needed for moderate pain or headache.    [provider]  albuterol (VENTOLIN HFA) 108 (90 Base) MCG/ACT inhaler Inhale 2 puffs into the lungs every 6 (six) hours as needed for wheezing or shortness of breath. 08/13/21   Newlin, Enobong, MD  amLODipine (NORVASC) 5 MG tablet TAKE 1 TABLET (5 MG TOTAL) BY MOUTH DAILY. TO LOWER BLOOD PRESSURE. STOP LOSARTAN 08/13/21   Charlott Rakes, MD  aspirin 81 MG chewable tablet Chew 81 mg by mouth daily.    [provider]  atorvastatin (LIPITOR) 20 MG tablet TAKE 1 TABLET (20 MG TOTAL) BY MOUTH DAILY. 12/08/21   Charlott Rakes, MD  Blood Glucose Monitoring Suppl (ACCU-CHEK GUIDE ME) w/Device KIT Use to check blood sugar once daily. 01/07/21   Charlott Rakes, MD  cetirizine (ZYRTEC) 10 MG tablet Take 1 tablet (10 mg total) by mouth  daily. 07/27/21   Charlott Rakes, MD  clonazePAM (KLONOPIN) 0.5 MG tablet Take 1 tablet (0.5 mg total) by mouth at bedtime. 09/27/21   Vaslow, Acey Lav, MD  diazepam (VALIUM) 2 MG tablet Take 1 tablet (2 mg total) by mouth 2 (two) times daily. 08/15/21   Valarie Merino, MD  docusate sodium (COLACE) 100 MG capsule Take 1 capsule (100 mg total) by mouth 2 (two) times daily. 01/09/21   Meredith Pel, MD  fluticasone (FLONASE) 50 MCG/ACT nasal spray Place 2 sprays into both nostrils daily. Patient taking differently: Place 2 sprays into both nostrils daily as needed for allergies. 07/27/21   Charlott Rakes, MD  glucose blood (ACCU-CHEK GUIDE) test strip Use to check blood sugar once daily. 08/13/21   Charlott Rakes, MD  hydrocortisone (ANUSOL-HC) 2.5 % rectal cream Place 1 application rectally 2 (two) times daily. 08/22/21   Lacretia Leigh, MD  ketoconazole (NIZORAL) 2 % cream Apply 1 application. topically daily. 10/05/21   Lorenda Peck, DPM  Lacosamide 100 MG TABS TAKE 1 TABLET (100 MG TOTAL) BY MOUTH IN THE MORNING AND AT BEDTIME. 10/11/21   Vaslow, Acey Lav, MD  lamoTRIgine (LAMICTAL) 100 MG tablet TAKE 2 TABLETS (200 MG TOTAL) BY MOUTH 2 (TWO) TIMES DAILY. 12/08/21   [name], MD  meclizine (ANTIVERT) 25 MG tablet Take 1 tablet (25 mg total) by mouth 3 (three) times daily as needed for dizziness. 01/04/22   Cleva Camero, Alvin Critchley, DO  meloxicam (MOBIC) 15 MG tablet 1 po q day prn pain 09/03/21   Meredith Pel, MD  metFORMIN (GLUCOPHAGE) 500 MG tablet Take 1 tablet (500 mg total) by mouth 2 (two) times daily. 08/13/21   Charlott Rakes, MD  methocarbamol (ROBAXIN) 500 MG tablet Take 1 tablet (500 mg total) by mouth 2 (two) times daily. 10/28/21   Prosperi, Christian H, PA-C  Misc. Devices MISC Please provide BP cuff for patient 07/08/20   Argentina Donovan, PA-C  pantoprazole (PROTONIX) 20 MG tablet Take 1 tablet (20 mg total) by mouth 2 (two) times daily before a meal. Patient taking  differently: Take 20 mg by mouth as needed. 08/13/21 08/13/22  Charlott Rakes, MD  tamsulosin (FLOMAX) 0.4 MG CAPS capsule Take 1 capsule (0.4 mg total) by mouth daily. 11/23/21   Zara Council A, PA-C  VITAMIN A PO Take 1 capsule by mouth daily.    [provider]      Allergies    Patient has no known allergies.    Review of Systems   Review of Systems  Constitutional:  Negative for fever.  Respiratory:  Negative for shortness of breath.   Cardiovascular:  Negative for chest pain.  Gastrointestinal:  Negative for abdominal pain, diarrhea and vomiting.  Skin:  Negative for rash.  Neurological:  Positive for dizziness and headaches. Negative for tremors, syncope, facial asymmetry, speech difficulty, weakness, light-headedness and numbness.    Physical Exam Updated Vital Signs BP 118/77   Pulse 75   Temp 98.3 F (36.8 C) (Oral)   Resp 16   Ht _0  (1.753 m)   Wt 81.6 kg   SpO2 98%   BMI 26.58 kg/m  Physical Exam Vitals and nursing note reviewed.  Constitutional:      Appearance: Normal appearance.  HENT:     Head: Normocephalic.     Mouth/Throat:     Mouth: Mucous membranes are moist.  Eyes:     General: No visual field deficit.    Extraocular Movements: Extraocular movements intact.     Right eye: No nystagmus.     Left eye: No nystagmus.     Pupils: Pupils are equal, round, and reactive to light.  Cardiovascular:     Rate and Rhythm: Normal rate.  Pulmonary:     Effort: Pulmonary effort is normal. No respiratory distress.  Abdominal:     Palpations: Abdomen is soft.     Tenderness: There is no abdominal tenderness.  Musculoskeletal:     Cervical back: No rigidity.  Skin:    General: Skin is warm.  Neurological:     Mental Status: He is alert and oriented to person, place, and time. Mental status is at baseline.     Cranial Nerves: No cranial nerve deficit, dysarthria or facial asymmetry.     Sensory: No sensory deficit.     Gait: Gait normal.   Psychiatric:        Mood and Affect: Mood normal.     ED Results / Procedures / Treatments   Labs (all labs ordered are listed, but only abnormal results are displayed) Labs Reviewed  CBC  BASIC METABOLIC PANEL    EKG EKG Interpretation  Date/Time:  Tuesday January 04 2022 20:54:52 EDT Ventricular Rate:  76 PR Interval:  172 QRS Duration: 103 QT Interval:  374 QTC Calculation: 421 R Axis:   122 Text Interpretation: Sinus rhythm Consider right ventricular hypertrophy No significant change was found Confirmed by Ezequiel Essex [phone]) on 01/04/2022  9:00:30 PM  Radiology CT Head Wo Contrast  Result Date: 01/04/2022 CLINICAL DATA:  Headache. EXAM: CT HEAD WITHOUT CONTRAST TECHNIQUE: Contiguous axial images were obtained from the base of the skull through the vertex without intravenous contrast. RADIATION DOSE REDUCTION: This exam was performed according to the departmental dose-optimization program which includes automated exposure control, adjustment of the mA and/or kV according to patient size and/or use of iterative reconstruction technique. COMPARISON:  September 14, 2021 FINDINGS: Brain: There is mild cerebral atrophy with widening of the extra-axial spaces and ventricular dilatation. There are areas of decreased attenuation within the white matter tracts of the supratentorial brain, consistent with microvascular disease changes. Stable postoperative changes are seen along the frontal convexities. Vascular: No hyperdense vessel or unexpected calcification. Skull: A stable craniotomy defect is seen along the frontal convexities. No acute osseous abnormalities are identified. Sinuses/Orbits: No acute finding. Other: None. IMPRESSION: 1. Generalized cerebral atrophy and chronic white matter small vessel ischemic changes without evidence of an acute intracranial abnormality. 2. Stable postoperative changes along the frontal convexities. Electronically Signed   By: Virgina Norfolk M.D.    On: 01/04/2022 20:53    Procedures Procedures    Medications Ordered in ED Medications - No data to display  ED Course/ Medical Decision Making/ A&P                           Medical Decision Making  61 year old male presents emergency department with concern for more frequent headaches.  Patient has history of headaches and vertigo/dizziness at baseline.  He was diagnosed with vertigo a couple months ago, placed on meclizine.  Had an MRI of the brain done at that time was negative.  Patient presents today because the headaches became more frequent this past week.  Currently he is headache and symptom-free.  He states that the dizziness he was experiencing is similar to the vertiginous symptoms that he had a couple months ago.  He ran out of his meclizine medication.  Denies any other acute fever, illness.  Patient denies any other neuro complaints.  He is neuro intact on exam.  Low suspicion for central cause of dizziness as the symptoms have been ruled out for CVA about 4 months ago.  Patient is requesting a refill of his meclizine.  Has no active headache.  States that he came in just with a head CT given his history of meningiomas and wanted to make sure there was no complications.  Head CT is unremarkable besides chronic postoperative changes.  He states he will follow-up with his doctors as an outpatient, declines blood work.  Patient at this time appears safe and stable for discharge and close outpatient follow up. Discharge plan and strict return to ED precautions discussed, patient verbalizes understanding and agreement.        Final Clinical Impression(s) / ED Diagnoses Final diagnoses:  Nonintractable headache, unspecified chronicity pattern, unspecified headache type    Rx / DC Orders ED Discharge Orders          Ordered    meclizine (ANTIVERT) 25 MG tablet  3 times daily PRN        01/04/22 2218              Joliet Mallozzi, Alvin Critchley, DO 01/04/22 2230

## 2022-01-04 NOTE — ED Provider Triage Note (Addendum)
Emergency Medicine Provider Triage Evaluation Note  Joshua [name] , a 61 y.o. male  was evaluated in triage.  Pt complains of headache. States that same has been present since Thursday and is not improving with OTC pain meds. States that the pain is progressively worsening and is located on the right side of his head. States that it feels 'slushie' on that side. Also endorses intermittent sharp pains that start behind his eyes and radiate throughout the right side of his head. Endorses some associated blurred vision and dizziness which he describes as 'feeling off-kilter.' Denies fevers, chills, neck pain, chest pain, or shortness of breath. History of right sided brain tumor with right craniotomy in 2020.  Review of Systems  Positive:  Negative:   Physical Exam  BP (!) 159/91 (BP Location: Left Arm)   Pulse 75   Temp 98.3 F (36.8 C) (Oral)   Resp 14   Ht '5\' 9"'$  (1.753 m)   Wt 81.6 kg   SpO2 99%   BMI 26.58 kg/m  Gen:   Awake, no distress   Resp:  Normal effort  MSK:   Moves extremities without difficulty  Other:  Alert and oriented without focal deficits.  Medical Decision Making  Medically screening exam initiated at 8:30 PM.  Appropriate orders placed.  CASSIE HENKELS was informed that the remainder of the evaluation will be completed by another provider, this initial triage assessment does not replace that evaluation, and the importance of remaining in the ED until their evaluation is complete.     Nestor Lewandowsky 01/04/22 2033    Bud Face, PA-C 01/04/22 2034

## 2022-01-07 ENCOUNTER — Ambulatory Visit (INDEPENDENT_AMBULATORY_CARE_PROVIDER_SITE_OTHER): Payer: Medicare Other

## 2022-01-07 DIAGNOSIS — Z8249 Family history of ischemic heart disease and other diseases of the circulatory system: Secondary | ICD-10-CM

## 2022-01-07 DIAGNOSIS — I700 Atherosclerosis of aorta: Secondary | ICD-10-CM

## 2022-01-07 DIAGNOSIS — R002 Palpitations: Secondary | ICD-10-CM | POA: Diagnosis not present

## 2022-01-07 DIAGNOSIS — E7800 Pure hypercholesterolemia, unspecified: Secondary | ICD-10-CM | POA: Diagnosis not present

## 2022-01-07 DIAGNOSIS — I10 Essential (primary) hypertension: Secondary | ICD-10-CM | POA: Diagnosis not present

## 2022-01-17 ENCOUNTER — Encounter: Payer: Self-pay | Admitting: Urology

## 2022-01-17 ENCOUNTER — Ambulatory Visit (INDEPENDENT_AMBULATORY_CARE_PROVIDER_SITE_OTHER): Payer: Medicare Other | Admitting: Urology

## 2022-01-17 VITALS — BP 145/90 | HR 98 | Ht 69.0 in | Wt 185.0 lb

## 2022-01-17 DIAGNOSIS — R972 Elevated prostate specific antigen [PSA]: Secondary | ICD-10-CM

## 2022-01-17 MED ORDER — TADALAFIL 5 MG PO TABS: 5.0000 mg | ORAL_TABLET | Freq: Every day | ORAL | 11 refills | Status: AC | PRN

## 2022-01-17 MED ORDER — TADALAFIL 5 MG PO TABS: 5.0000 mg | ORAL_TABLET | Freq: Every day | ORAL | 11 refills | Status: DC | PRN

## 2022-01-17 NOTE — Progress Notes (Signed)
   01/17/2022 10:25 AM   [name] July 04, 1961 [id number]  Reason for visit: Prostate concerns, pelvic pain, BPH, new ED  HPI: 61 year old male who has been followed primarily by Zara Council, PA and Debroah Loop, PA for the above issues over the last few years.  I saw him in January 2022 when he was having some mild nocturia and urinary symptoms that improved significantly on Flomax.  Today, he has no concerns with his urination, and is doing well on the Flomax, however is concerned about some pelvic discomfort he experiences 1-2 times per month that is temporary.  This does not seem to be associated with any urinary problems or constipation.  He is worried this could be prostate cancer, as he had headaches as the initial sign of his brain tumor.  His PSA has been within the normal range, most recently 3.9 in April 2023 which is stable from prior.  Urinalysis in May 2023 was benign.  Prostate measured 35 g on CT in August 2021.  DRE today 30 g, smooth, no nodules or masses.  We discussed possible etiologies of his pelvic discomfort including constipation, pelvic floor dysfunction, or other more rare etiologies.  He is very concerned this could be prostate cancer and wants a prostate MRI.  He has a hip prosthesis, and we discussed that these may not be compatible with the MRI.  He remains very adamant about obtaining a prostate MRI, and we will see if this is covered, and if it is compatible with his hip replacement.  We discussed his benign DRE and normal PSA are both reassuring.  I also recommended trying some pelvic floor exercises to see if this helps with some of his very rare pelvic discomfort.  He also has some concerns about erectile dysfunction and maintaining erections and is interested in a trial of medications.  Risks and benefits of Cialis discussed, and we will start with 5 mg as needed.  We will see if prostate MRI compatible with hip prosthesis, call with  results Continue yearly PSA screening and symptom check regarding urinary symptoms on [place], MD  [facility] [address] [place], [place] [address] [phone]

## 2022-01-24 ENCOUNTER — Other Ambulatory Visit: Payer: Self-pay | Admitting: Internal Medicine

## 2022-01-24 DIAGNOSIS — R569 Unspecified convulsions: Secondary | ICD-10-CM

## 2022-01-24 DIAGNOSIS — C700 Malignant neoplasm of cerebral meninges: Secondary | ICD-10-CM

## 2022-01-26 ENCOUNTER — Telehealth: Payer: Self-pay

## 2022-01-26 NOTE — Telephone Encounter (Signed)
Patient is following up. He states he wore the monitor and plans to drop it off today. RN unavailable for transfer.

## 2022-01-26 NOTE — Telephone Encounter (Signed)
Received notification that the patient has not worn the heart monitor that was ordered.  Called patient to discuss. Left message for patient to call back.

## 2022-02-21 ENCOUNTER — Telehealth: Payer: Self-pay | Admitting: Cardiology

## 2022-02-21 NOTE — Telephone Encounter (Signed)
-----   Message from Sueanne Margarita, MD sent at 02/19/2022  7:57 PM EDT ----- Essentially normal heart monitor with a rare extra heart beat from the top of the heart which is benign

## 2022-02-21 NOTE — Telephone Encounter (Signed)
Patient is requesting a call back to discuss monitor results. 

## 2022-02-21 NOTE — Telephone Encounter (Signed)
The patient has been notified of the result and verbalized understanding.  All questions (if any) were answered. Antonieta Iba, RN 02/21/2022 11:36 AM

## 2022-02-22 ENCOUNTER — Encounter (HOSPITAL_COMMUNITY): Payer: Self-pay

## 2022-02-22 ENCOUNTER — Emergency Department (HOSPITAL_COMMUNITY)
Admission: EM | Admit: 2022-02-22 | Discharge: 2022-02-23 | Discharge status: Against Medical Advice | Payer: Medicare Other | Attending: Emergency Medicine | Admitting: Emergency Medicine

## 2022-02-22 ENCOUNTER — Other Ambulatory Visit: Payer: Self-pay

## 2022-02-22 DIAGNOSIS — Z20822 Contact with and (suspected) exposure to covid-19: Secondary | ICD-10-CM | POA: Diagnosis not present

## 2022-02-22 DIAGNOSIS — R0989 Other specified symptoms and signs involving the circulatory and respiratory systems: Secondary | ICD-10-CM | POA: Diagnosis not present

## 2022-02-22 DIAGNOSIS — R519 Headache, unspecified: Secondary | ICD-10-CM | POA: Diagnosis present

## 2022-02-22 DIAGNOSIS — R197 Diarrhea, unspecified: Secondary | ICD-10-CM | POA: Insufficient documentation

## 2022-02-22 DIAGNOSIS — Z5321 Procedure and treatment not carried out due to patient leaving prior to being seen by health care provider: Secondary | ICD-10-CM | POA: Diagnosis not present

## 2022-02-22 LAB — SARS CORONAVIRUS 2 BY RT PCR: SARS Coronavirus 2 by RT PCR: NEGATIVE

## 2022-02-22 NOTE — ED Triage Notes (Signed)
Pt reports with headache, diarrhea, and runny nose since today. Pt states that he ate a healthy choice meal and thought that it is what made him feel sick.

## 2022-02-28 ENCOUNTER — Other Ambulatory Visit: Payer: Self-pay | Admitting: Internal Medicine

## 2022-02-28 ENCOUNTER — Other Ambulatory Visit: Payer: Self-pay | Admitting: Podiatry

## 2022-02-28 ENCOUNTER — Ambulatory Visit (HOSPITAL_COMMUNITY): Payer: Medicare Other | Attending: Urology

## 2022-03-02 ENCOUNTER — Ambulatory Visit (INDEPENDENT_AMBULATORY_CARE_PROVIDER_SITE_OTHER): Payer: Medicare Other | Admitting: Podiatry

## 2022-03-02 DIAGNOSIS — Z91199 Patient's noncompliance with other medical treatment and regimen due to unspecified reason: Secondary | ICD-10-CM

## 2022-03-02 NOTE — Progress Notes (Signed)
No show

## 2022-03-10 ENCOUNTER — Ambulatory Visit
Admission: RE | Admit: 2022-03-10 | Discharge: 2022-03-10 | Disposition: A | Discharge status: Home / Self Care | Payer: Medicare Other | Source: Ambulatory Visit | Attending: Urology | Admitting: Urology

## 2022-03-10 DIAGNOSIS — R972 Elevated prostate specific antigen [PSA]: Secondary | ICD-10-CM | POA: Insufficient documentation

## 2022-03-10 MED ORDER — GADOBUTROL 1 MMOL/ML IV SOLN
7.5000 mL | Freq: Once | INTRAVENOUS | Status: AC | PRN
  Administered 2022-03-10: 7.5 mL via INTRAVENOUS

## 2022-03-17 ENCOUNTER — Telehealth: Payer: Self-pay

## 2022-03-17 NOTE — Telephone Encounter (Signed)
-----   Message from Billey Co, MD sent at 03/16/2022 12:37 PM EDT ----- Prostate MRI did show a small suspicious area in the prostate.  We do not know if this is prostate cancer or not without performing a biopsy.  Please review biopsy instructions, and schedule prostate biopsy with me in the next few weeks.  Nickolas Madrid, MD 03/16/2022

## 2022-03-17 NOTE — Telephone Encounter (Signed)
Called pt informed him of information below. Pt voiced understanding. Appt scheduled. Pt advised to discontinue ASA '81mg'$ , and advised to complete Fleets enema prior to procedure. Information sent via Mychart.

## 2022-03-28 ENCOUNTER — Encounter: Payer: Self-pay | Admitting: Podiatry

## 2022-03-28 ENCOUNTER — Ambulatory Visit (INDEPENDENT_AMBULATORY_CARE_PROVIDER_SITE_OTHER): Payer: Medicare Other | Admitting: Podiatry

## 2022-03-28 ENCOUNTER — Other Ambulatory Visit: Payer: Self-pay

## 2022-03-28 ENCOUNTER — Inpatient Hospital Stay: Payer: Medicare Other | Attending: Internal Medicine | Admitting: Internal Medicine

## 2022-03-28 VITALS — BP 135/73 | HR 70 | Temp 98.1°F | Resp 15 | Ht 69.0 in | Wt 191.8 lb

## 2022-03-28 DIAGNOSIS — D329 Benign neoplasm of meninges, unspecified: Secondary | ICD-10-CM | POA: Insufficient documentation

## 2022-03-28 DIAGNOSIS — L600 Ingrowing nail: Secondary | ICD-10-CM | POA: Diagnosis not present

## 2022-03-28 DIAGNOSIS — R569 Unspecified convulsions: Secondary | ICD-10-CM | POA: Diagnosis not present

## 2022-03-28 DIAGNOSIS — G40109 Localization-related (focal) (partial) symptomatic epilepsy and epileptic syndromes with simple partial seizures, not intractable, without status epilepticus: Secondary | ICD-10-CM | POA: Diagnosis not present

## 2022-03-28 DIAGNOSIS — L603 Nail dystrophy: Secondary | ICD-10-CM | POA: Diagnosis not present

## 2022-03-28 DIAGNOSIS — C700 Malignant neoplasm of cerebral meninges: Secondary | ICD-10-CM

## 2022-03-28 DIAGNOSIS — Z79899 Other long term (current) drug therapy: Secondary | ICD-10-CM | POA: Insufficient documentation

## 2022-03-28 DIAGNOSIS — D320 Benign neoplasm of cerebral meninges: Secondary | ICD-10-CM

## 2022-03-28 MED ORDER — LAMOTRIGINE 200 MG PO TABS: 200.0000 mg | ORAL_TABLET | Freq: Two times a day (BID) | ORAL | 5 refills | Status: DC

## 2022-03-28 NOTE — Progress Notes (Signed)
Subjective:  Patient ID: YVON MCCORD, male    DOB: [birth year],   MRN: [id number]  Chief Complaint  Patient presents with   Nail Problem    Follow up left foot toenail fungus. Patient states that his toenail pain is 8/10. 2nd toe left foot is primarily where the pain is located.    61 y.o. male presents for follow-up of his right second toenail. Relates he has continued to have pain. States the padding helps but realizes he would like to have the nail removed at this point. Requesting procedure.  Denies any other pedal complaints. Denies n/v/f/c.   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 CFT <3 seconds. Normal hair growth on digits. No edema.  Skin. No lacerations or abrasions bilateral feet. Right second digit thickened discolored and elongated. Small eccymosis noted to lateral ankle.  Musculoskeletal: MMT 5/5 bilateral lower extremities in DF, PF, Inversion and Eversion. Deceased ROM in DF of ankle joint.  Bilateral spurring noted to first MPJ No pain to palpation. Bilateral ;hammered digits 2-5 with no pain. Right tailors bunion no pain to palpation and no pain with ROM.  Neurological: Sensation intact to light touch.   Assessment:   1. Onychodystrophy   2. Ingrown nail of second toe of right foot       Plan:  Patient was evaluated and treated and all questions  answered. Patient requesting removal of ingrown nail today. Procedure below.  Discussed procedure and post procedure care and patient expressed understanding.  Will follow-up in 2 weeks for nail check or sooner if any problems arise.    Procedure:  Procedure: total Nail Avulsion of right second digit  nail  Surgeon: Lorenda Peck, DPM  Pre-op Dx: Ingrown toenail without infection Post-op: Same  Place of Surgery: Office exam room.  Indications for surgery: Painful and ingrown toenail.    The patient is requesting removal of nail without chemical matrixectomy. Risks and complications were discussed with the patient for which they understand and written consent was obtained. Under sterile conditions a total of 3 mL of  1% lidocaine plain was infiltrated in a hallux block fashion. Once anesthetized, the skin was prepped in sterile fashion. A tourniquet was then applied. Next the entire second digit nail was removed. Next phenol was then applied under standard conditions and copiously irrigated. Silvadene was applied. A dry sterile dressing was applied. After application of the dressing the tourniquet was removed and there is found to be an immediate capillary refill time to the digit. The patient tolerated the procedure well without any complications. Post procedure instructions were discussed the patient for which he verbally understood. Follow-up in two weeks for nail check or sooner if any problems are to arise. Discussed signs/symptoms of infection and directed to call the office  immediately should any occur or go directly to the emergency room. In the meantime, encouraged to call the office with any questions, concerns, changes symptoms.    Lorenda Peck, DPM

## 2022-03-28 NOTE — Patient Instructions (Signed)

## 2022-03-28 NOTE — Progress Notes (Signed)
[facility] [place], [place] [address] [phone]   Interval Evaluation  Date of Service: 03/28/22 Patient Name: Joshua [name] Patient MRN: [id number] Patient DOB: [birth year] Provider: [name], MD  Identifying Statement:  Joshua [name] is a 61 y.o. male with  parasaggital  meningioma and focal epilepsy  Oncologic History: 10/30/12: Craniotomy and resection of parasaggital meningioma by Dr. Vertell Limber (WHO I) 01/10/19: Repeat craniotomy, debulking resection by Dr. Vertell Limber after tumor recurrence, seizures.  Path demonstrates islands of anaplasia c/w grade II/III. 04/05/19: Completes post-operative IMRT with Dr. Lisbeth Renshaw  Interval History:  Joshua [name] presents today for clinical follow up. No seizures since prior visit 6 months ago, maintains good comliance with Vimpat and Lamictal.  Still no utilizing any ativan at this time.  Joshua [name] has been dosing 0.16m at night.  Remains physically active, engaged.  H+P (02/05/19) Patient presents to review clinical course and care for his meningioma.  Initially [name] presented in 2014 with headache syndrome, was found to have tumor on imaging which was resected by Dr. SVertell Limber  The patient was lost to follow up for ~5 years until [name] developed seizures, described as "twitching of left leg spreading to arm" this past month.  [name] required loads of Keppra and Dilantin to break events.  MRI demonstrated significant regrowth of the mass, and repeat craniotomy was performed on 01/10/19.  Small amount of residual tumor was visible on post-operative MRI.  Since surgery [name] has continued to experience numbness and clumsiness of his left leg, requiring cane for ambulation.  [name] is still pending home physical therapy evaluation.  [name] has had "small" seizures, consisting of few seconds of shaking of left leg, occurring less than daily.  Continues on Keppra and Dilantin.    Medications: Current Outpatient  Medications on File Prior to Visit  Medication Sig Dispense Refill   Accu-Chek Softclix Lancets lancets Use as instructed 100 each 12   acetaminophen (TYLENOL) 500 MG tablet Take 500 mg by mouth every 6 (six) hours as needed for moderate pain or headache.     albuterol (VENTOLIN HFA) 108 (90 Base) MCG/ACT inhaler Inhale 2 puffs into the lungs every 6 (six) hours as needed for wheezing or shortness of breath. 8 g 2   amLODipine (NORVASC) 5 MG tablet TAKE 1 TABLET (5 MG TOTAL) BY MOUTH DAILY. TO LOWER BLOOD PRESSURE. STOP LOSARTAN 90 tablet 0   aspirin 81 MG chewable tablet Chew 81 mg by mouth daily.     atorvastatin (LIPITOR) 20 MG tablet TAKE 1 TABLET (20 MG TOTAL) BY MOUTH DAILY. 30 tablet 0   Blood Glucose Monitoring Suppl (ACCU-CHEK GUIDE ME) w/Device KIT Use to check blood sugar once daily. 1 kit 0   cetirizine (ZYRTEC) 10 MG tablet Take 1 tablet (10 mg total) by mouth daily. 30 tablet 1   clonazePAM (Joshua) 0.5 MG tablet Take 1 tablet (0.5 mg total) by mouth at bedtime. 60 tablet 2   diazepam (VALIUM) 2 MG tablet Take 1 tablet (2 mg total) by mouth 2 (two) times daily. 8 tablet 0   docusate sodium (COLACE) 100 MG capsule Take 1 capsule (100 mg total) by mouth 2 (two) times daily. 10 capsule 0   fluticasone (FLONASE) 50 MCG/ACT nasal spray Place 2 sprays into both nostrils daily. 16 g 1   glucose blood (ACCU-CHEK GUIDE) test strip Use to check blood sugar once daily. 100 each 6   hydrocortisone (ANUSOL-HC)  2.5 % rectal cream Place 1 application rectally 2 (two) times daily. 30 g 0   ketoconazole (NIZORAL) 2 % cream APPLY 1 APPLICATION. TOPICALLY DAILY. 60 g 2   Lacosamide 100 MG TABS TAKE 1 TABLET (100 MG TOTAL) BY MOUTH IN THE MORNING AND AT BEDTIME. 60 tablet 3   lamoTRIgine (LAMICTAL) 100 MG tablet TAKE 2 TABLETS (200 MG TOTAL) BY MOUTH 2 (TWO) TIMES DAILY. 120 tablet 0   meclizine (ANTIVERT) 25 MG tablet Take 1 tablet (25 mg total) by mouth 3 (three) times daily as needed for dizziness.  30 tablet 0   meloxicam (MOBIC) 15 MG tablet 1 po q day prn pain 30 tablet 0   metFORMIN (GLUCOPHAGE) 500 MG tablet Take 1 tablet (500 mg total) by mouth 2 (two) times daily. 180 tablet 1   methocarbamol (ROBAXIN) 500 MG tablet Take 1 tablet (500 mg total) by mouth 2 (two) times daily. 20 tablet 0   Misc. Devices MISC Please provide BP cuff for patient 1 each 0   pantoprazole (PROTONIX) 20 MG tablet Take 1 tablet (20 mg total) by mouth 2 (two) times daily before a meal. 60 tablet 3   tadalafil (CIALIS) 5 MG tablet Take 1 tablet (5 mg total) by mouth daily as needed for erectile dysfunction. 1 hour prior to sexual activity 30 tablet 11   tamsulosin (FLOMAX) 0.4 MG CAPS capsule Take 1 capsule (0.4 mg total) by mouth daily. 90 capsule 3   VITAMIN A PO Take 1 capsule by mouth daily.     No current facility-administered medications on file prior to visit.    Allergies: No Known Allergies Past Medical History:  Past Medical History:  Diagnosis Date   Anxiety    Brain tumor ([facility]) 02/20/2013   brain tumor removed in March 2014, Dr Donald Pore   Diffuse idiopathic skeletal hyperostosis 06/05/2013   Dizziness    Enlarged prostate    GERD (gastroesophageal reflux disease)    Headache(784.0)    scattered   High cholesterol    Hypertension    Malignant meningioma of meninges of brain ([facility]) 03/07/2019   Meningioma ([facility])    Meningioma, recurrent of brain ([facility]) 01/31/2019   Neuropathy 12/05/2019   Pre-diabetes    Rotator cuff tear    right   Seizures ([place] [facility])    11/27/19 last sz 1 wk ago   Past Surgical History:  Past Surgical History:  Procedure Laterality Date   APPLICATION OF CRANIAL NAVIGATION N/A 01/10/2019   Procedure: APPLICATION OF CRANIAL NAVIGATION;  Surgeon: Erline Levine, MD;  Location: [facility];  Service: Neurosurgery;  Laterality: N/A;   CATARACT EXTRACTION Right    CRANIOTOMY Right 11/06/2012   Procedure: CRANIOTOMY TUMOR EXCISION;  Surgeon: Erline Levine, MD;  Location: [facility];   Service: Neurosurgery;  Laterality: Right;  Right Parasagittal craniotomy for meningioma with [facility]   CRANIOTOMY Right 01/10/2019   Procedure: Right Parasagittal Craniotomy for Tumor;  Surgeon: Erline Levine, MD;  Location: [facility];  Service: Neurosurgery;  Laterality: Right;  Right parasagittal craniotomy for tumor   ESOPHAGOGASTRODUODENOSCOPY     JOINT REPLACEMENT Right 2018   right hip   REVERSE SHOULDER ARTHROPLASTY Right 01/08/2021   Procedure: RIGHT REVERSE SHOULDER ARTHROPLASTY;  Surgeon: Meredith Pel, MD;  Location: [facility];  Service: Orthopedics;  Laterality: Right;   right knee arthroscopy     SHOULDER ARTHROSCOPY Right 06/09/2020   Procedure: RIGHT SHOULDER ARTHROSCOPY AND DEBRIDEMENT;  Surgeon: Newt Minion, MD;  Location: [facility];  Service: Orthopedics;  Laterality: Right;   Social History:  Social History   Socioeconomic History   Marital status: Legally Separated    Spouse name: Not on file   Number of children: 2   Years of education: Not on file   Highest education level: Not on file  Occupational History   Not on file  Tobacco Use   Smoking status: Former    Types: Cigarettes    Quit date: 01/29/2019    Years since quitting: 3.1    Passive exposure: Past   Smokeless tobacco: Never   Tobacco comments:    weekend smoker  Vaping Use   Vaping Use: Never used  Substance and Sexual Activity   Alcohol use: Not Currently    Comment: social   Drug use: No   Sexual activity: Not Currently  Other Topics Concern   Not on file  Social History Narrative      Caffeine- soda occass   Social Determinants of Health   Financial Resource Strain: Not on file  Food Insecurity: Not on file  Transportation Needs: No Transportation Needs (01/31/2019)   PRAPARE - Hydrologist (Medical): No    Lack of Transportation (Non-Medical): No  Physical Activity: Not on file  Stress: Not on file  Social Connections: Not on file   Intimate Partner Violence: Not on file   Family History:  Family History  Problem Relation Age of Onset   Hypertension Mother    Dementia Mother    Diabetes Mother    Hypertension Father    CAD Father    Diabetes Father    CAD Brother     Review of Systems: Constitutional: Denies fevers, chills or abnormal weight loss Eyes: Denies blurriness of vision Ears, nose, mouth, throat, and face: Denies mucositis or sore throat Respiratory: Denies cough, dyspnea or wheezes Cardiovascular: Denies palpitation, chest discomfort or lower extremity swelling Gastrointestinal:  Denies nausea, constipation, diarrhea GU: Denies dysuria or incontinence Skin: Denies abnormal skin rashes Neurological: Per HPI Musculoskeletal: Denies joint pain, back or neck discomfort. No decrease in ROM Behavioral/Psych: Denies anxiety, disturbance in thought content, and mood instability  Physical Exam: There were no vitals filed for this visit.  KPS: 80. General: Alert, cooperative, pleasant, in no acute distress Head: Craniotomy scar noted, dry and intact. EENT: No conjunctival injection or scleral icterus. Oral mucosa moist Lungs: Resp effort normal Cardiac: Regular rate and rhythm Abdomen: Soft, non-distended abdomen Skin: No rashes cyanosis or petechiae. Extremities: No clubbing or edema  Neurologic Exam: Mental Status: Awake, alert, attentive to examiner. Oriented to self and environment. Language is fluent with intact comprehension.  Cranial Nerves: Visual acuity is grossly normal. Visual fields are full. Extra-ocular movements intact. No ptosis. Face is symmetric, tongue midline. Motor: Tone and bulk are normal.Power is impaired in left leg, 4+/5 distally. Reflexes are symmetric, no pathologic reflexes present. Impaired heel to shin left leg Sensory: Stocking sensory loss Gait: Cane assisted  Labs: I have reviewed the data as listed    Component Value Date/Time   NA 139 09/17/2021 0406   NA  144 04/15/2020 1515   K 4.5 09/17/2021 0406   CL 105 09/17/2021 0406   CO2 26 09/17/2021 0406   GLUCOSE 99 09/17/2021 0406   BUN 16 09/17/2021 0406   BUN 13 04/15/2020 1515   CREATININE 0.94 09/17/2021 0406   CALCIUM 8.7 (L) 09/17/2021 0406   PROT 7.1 09/14/2021 1600   PROT 7.0 04/15/2020 1515  ALBUMIN 4.5 09/14/2021 1600   ALBUMIN 4.7 04/15/2020 1515   AST 18 09/14/2021 1600   ALT 22 12/27/2021 1218   ALKPHOS 64 09/14/2021 1600   BILITOT 0.5 09/14/2021 1600   BILITOT <0.2 04/15/2020 1515   GFRNONAA >60 09/17/2021 0406   GFRAA >60 04/17/2020 1313   Lab Results  Component Value Date   WBC 6.9 09/17/2021   NEUTROABS 3.5 09/14/2021   HGB 14.3 09/17/2021   HCT 42.4 09/17/2021   MCV 92.0 09/17/2021   PLT 206 09/17/2021    Assessment/Plan 1. Meningioma, recurrent of brain ([place])  2. Seizures [facility])  Mr. Viverette is clinically stable today.  No new or progressive changes.   Will recommend continuing LMG 218m BID, Vimpat can be decreased to 100/50..Marland Kitchen  Joshua can continue at 0.524mHS as discussed, we provided a refill today.    [name] will continue to work on anxiety and recent stressors through his counselor.   We ask that GeBURLE KWANeturn to clinic in 6 months with MRI brain for evaluation, or sooner if needed.    We appreciate the opportunity to participate in the care of GeCECILIO OHLRICH   All questions were answered. The patient knows to call the clinic with any problems, questions or concerns. No barriers to learning were detected.    I have spent a total of 30 minutes of face-to-face and non-face-to-face time, excluding clinical staff time, preparing to see patient, ordering tests and/or medications, counseling the patient, and care coordination    ZaVentura SellersMD Medical Director of Neuro-Oncology [facility] WeEdith Endave9/11/23 9:48 AM

## 2022-03-31 DIAGNOSIS — E118 Type 2 diabetes mellitus with unspecified complications: Secondary | ICD-10-CM | POA: Diagnosis not present

## 2022-03-31 DIAGNOSIS — Z9889 Other specified postprocedural states: Secondary | ICD-10-CM | POA: Diagnosis not present

## 2022-03-31 DIAGNOSIS — R29898 Other symptoms and signs involving the musculoskeletal system: Secondary | ICD-10-CM | POA: Diagnosis not present

## 2022-04-07 ENCOUNTER — Encounter (HOSPITAL_COMMUNITY): Payer: Self-pay

## 2022-04-07 ENCOUNTER — Emergency Department (HOSPITAL_COMMUNITY): Payer: Medicare Other

## 2022-04-07 ENCOUNTER — Other Ambulatory Visit: Payer: Self-pay

## 2022-04-07 ENCOUNTER — Emergency Department (HOSPITAL_COMMUNITY)
Admission: EM | Admit: 2022-04-07 | Discharge: 2022-04-07 | Disposition: A | Discharge status: Home / Self Care | Payer: Medicare Other | Attending: Emergency Medicine | Admitting: Emergency Medicine

## 2022-04-07 DIAGNOSIS — W1839XA Other fall on same level, initial encounter: Secondary | ICD-10-CM | POA: Insufficient documentation

## 2022-04-07 DIAGNOSIS — W19XXXA Unspecified fall, initial encounter: Secondary | ICD-10-CM

## 2022-04-07 DIAGNOSIS — Z79899 Other long term (current) drug therapy: Secondary | ICD-10-CM | POA: Insufficient documentation

## 2022-04-07 DIAGNOSIS — S199XXA Unspecified injury of neck, initial encounter: Secondary | ICD-10-CM | POA: Diagnosis present

## 2022-04-07 DIAGNOSIS — Z9889 Other specified postprocedural states: Secondary | ICD-10-CM | POA: Diagnosis not present

## 2022-04-07 DIAGNOSIS — M4312 Spondylolisthesis, cervical region: Secondary | ICD-10-CM | POA: Diagnosis not present

## 2022-04-07 DIAGNOSIS — R296 Repeated falls: Secondary | ICD-10-CM | POA: Diagnosis not present

## 2022-04-07 DIAGNOSIS — Z7982 Long term (current) use of aspirin: Secondary | ICD-10-CM | POA: Insufficient documentation

## 2022-04-07 DIAGNOSIS — R519 Headache, unspecified: Secondary | ICD-10-CM | POA: Insufficient documentation

## 2022-04-07 DIAGNOSIS — S161XXA Strain of muscle, fascia and tendon at neck level, initial encounter: Secondary | ICD-10-CM | POA: Diagnosis not present

## 2022-04-07 DIAGNOSIS — M47812 Spondylosis without myelopathy or radiculopathy, cervical region: Secondary | ICD-10-CM | POA: Diagnosis not present

## 2022-04-07 LAB — COMPREHENSIVE METABOLIC PANEL
ALT: 22 U/L (ref 0–44)
AST: 20 U/L (ref 15–41)
Albumin: 4.4 g/dL (ref 3.5–5.0)
Alkaline Phosphatase: 66 U/L (ref 38–126)
Anion gap: 8 (ref 5–15)
BUN: 17 mg/dL (ref 8–23)
CO2: 27 mmol/L (ref 22–32)
Calcium: 9.5 mg/dL (ref 8.9–10.3)
Chloride: 108 mmol/L (ref 98–111)
Creatinine, Ser: 1.11 mg/dL (ref 0.61–1.24)
GFR, Estimated: >60 mL/min (ref >60)
Glucose, Bld: 122 mg/dL — ABNORMAL HIGH (ref 70–99)
Potassium: 4.2 mmol/L (ref 3.5–5.1)
Sodium: 143 mmol/L (ref 135–145)
Total Bilirubin: 0.5 mg/dL (ref 0.3–1.2)
Total Protein: 7.5 g/dL (ref 6.5–8.1)

## 2022-04-07 LAB — CBC WITH DIFFERENTIAL/PLATELET
Abs Immature Granulocytes: 0.01 10*3/uL (ref 0.00–0.07)
Basophils Absolute: 0 10*3/uL (ref 0.0–0.1)
Basophils Relative: 1 %
Eosinophils Absolute: 0.1 10*3/uL (ref 0.0–0.5)
Eosinophils Relative: 1 %
HCT: 48.1 % (ref 39.0–52.0)
Hemoglobin: 15.9 g/dL (ref 13.0–17.0)
Immature Granulocytes: 0 %
Lymphocytes Relative: 18 %
Lymphs Abs: 1.1 10*3/uL (ref 0.7–4.0)
MCH: 31.1 pg (ref 26.0–34.0)
MCHC: 33.1 g/dL (ref 30.0–36.0)
MCV: 93.9 fL (ref 80.0–100.0)
Monocytes Absolute: 0.7 10*3/uL (ref 0.1–1.0)
Monocytes Relative: 11 %
Neutro Abs: 4.5 10*3/uL (ref 1.7–7.7)
Neutrophils Relative %: 69 %
Platelets: 222 10*3/uL (ref 150–400)
RBC: 5.12 MIL/uL (ref 4.22–5.81)
RDW: 12.9 % (ref 11.5–15.5)
WBC: 6.5 10*3/uL (ref 4.0–10.5)
nRBC: 0 % (ref 0.0–0.2)

## 2022-04-07 MED ORDER — KETOROLAC TROMETHAMINE 15 MG/ML IJ SOLN
15.0000 mg | Freq: Once | INTRAMUSCULAR | Status: AC
  Administered 2022-04-07: 15 mg via INTRAMUSCULAR
  Filled 2022-04-07: qty 1

## 2022-04-07 MED ORDER — LIDOCAINE 5 % EX PTCH
1.0000 | MEDICATED_PATCH | CUTANEOUS | 0 refills | Status: AC
Start: 2022-04-07 — End: ?

## 2022-04-07 MED ORDER — LIDOCAINE 5 % EX PTCH
1.0000 | MEDICATED_PATCH | CUTANEOUS | Status: DC
  Administered 2022-04-07: 1 via TRANSDERMAL
  Filled 2022-04-07: qty 1

## 2022-04-07 MED ORDER — NAPROXEN 375 MG PO TABS
375.0000 mg | ORAL_TABLET | Freq: Two times a day (BID) | ORAL | 0 refills | Status: DC
Start: 2022-04-07 — End: 2022-09-18

## 2022-04-07 MED ORDER — METHOCARBAMOL 500 MG PO TABS: 500.0000 mg | ORAL_TABLET | Freq: Two times a day (BID) | ORAL | 0 refills | Status: DC

## 2022-04-07 NOTE — ED Provider Notes (Signed)
[facility] DEPT Provider Note   CSN: [id number] Arrival date & time: 04/07/22  1655     History  Chief Complaint  Patient presents with   Joshua [name] is a 61 y.o. male.  61 year old male presents today for evaluation of multiple falls in the past few weeks.  He states all of these are mechanical falls.  Denies loss of consciousness.  Denies head injury.  He states he always rolls to his side to protect his head.  He is not on anticoagulation.  His main complaint is tightness around his right side of his neck.  Denies any other complaints.  He has been ambulatory since the falls without difficulty.  Most recent fall was on Saturday.  He states he was helping his friend move when he tripped over a rug.  The history is provided by the patient. No language interpreter was used.       Home Medications Prior to Admission medications   Medication Sig Start Date End Date Taking? Authorizing Provider  lidocaine (LIDODERM) 5 % Place 1 patch onto the skin daily. Remove & Discard patch within 12 hours or as directed by MD 04/07/22  Yes Deatra Canter, Roy Snuffer, PA-C  methocarbamol (ROBAXIN) 500 MG tablet Take 1 tablet (500 mg total) by mouth 2 (two) times daily. 04/07/22  Yes Leimomi Zervas, PA-C  naproxen (NAPROSYN) 375 MG tablet Take 1 tablet (375 mg total) by mouth 2 (two) times daily. 04/07/22  Yes Deatra Canter, Joscelyne Renville, PA-C  Accu-Chek Softclix Lancets lancets Use as instructed 08/13/21   Charlott Rakes, MD  acetaminophen (TYLENOL) 500 MG tablet Take 500 mg by mouth every 6 (six) hours as needed for moderate pain or headache.    [provider]  albuterol (VENTOLIN HFA) 108 (90 Base) MCG/ACT inhaler Inhale 2 puffs into the lungs every 6 (six) hours as needed for wheezing or shortness of breath. 08/13/21   Newlin, Enobong, MD  amLODipine (NORVASC) 5 MG tablet TAKE 1 TABLET (5 MG TOTAL) BY MOUTH DAILY. TO LOWER BLOOD PRESSURE. STOP LOSARTAN 08/13/21   Charlott Rakes, MD   aspirin 81 MG chewable tablet Chew 81 mg by mouth daily.    [provider]  atorvastatin (LIPITOR) 20 MG tablet TAKE 1 TABLET (20 MG TOTAL) BY MOUTH DAILY. 12/08/21   Charlott Rakes, MD  Blood Glucose Monitoring Suppl (ACCU-CHEK GUIDE ME) w/Device KIT Use to check blood sugar once daily. 01/07/21   Charlott Rakes, MD  cetirizine (ZYRTEC) 10 MG tablet Take 1 tablet (10 mg total) by mouth daily. 07/27/21   Charlott Rakes, MD  clonazePAM (KLONOPIN) 0.5 MG tablet Take 1 tablet (0.5 mg total) by mouth at bedtime. 09/27/21   Vaslow, Acey Lav, MD  diazepam (VALIUM) 2 MG tablet Take 1 tablet (2 mg total) by mouth 2 (two) times daily. 08/15/21   Valarie Merino, MD  docusate sodium (COLACE) 100 MG capsule Take 1 capsule (100 mg total) by mouth 2 (two) times daily. 01/09/21   Meredith Pel, MD  fluticasone (FLONASE) 50 MCG/ACT nasal spray Place 2 sprays into both nostrils daily. 07/27/21   Charlott Rakes, MD  glucose blood (ACCU-CHEK GUIDE) test strip Use to check blood sugar once daily. 08/13/21   Charlott Rakes, MD  hydrocortisone (ANUSOL-HC) 2.5 % rectal cream Place 1 application rectally 2 (two) times daily. 08/22/21   Lacretia Leigh, MD  ketoconazole (NIZORAL) 2 % cream APPLY 1 APPLICATION. TOPICALLY DAILY. 02/28/22   Lorenda Peck, DPM  Lacosamide  100 MG TABS TAKE 1 TABLET (100 MG TOTAL) BY MOUTH IN THE MORNING AND AT BEDTIME. 03/01/22   Vaslow, Acey Lav, MD  lamoTRIgine (LAMICTAL) 200 MG tablet Take 1 tablet (200 mg total) by mouth 2 (two) times daily. 03/28/22   [name], MD  meclizine (ANTIVERT) 25 MG tablet Take 1 tablet (25 mg total) by mouth 3 (three) times daily as needed for dizziness. 01/04/22   Horton, Alvin Critchley, DO  metFORMIN (GLUCOPHAGE) 500 MG tablet Take 1 tablet (500 mg total) by mouth 2 (two) times daily. 08/13/21   Charlott Rakes, MD  Misc. Devices MISC Please provide BP cuff for patient 07/08/20   Argentina Donovan, PA-C  pantoprazole (PROTONIX) 20 MG tablet Take  1 tablet (20 mg total) by mouth 2 (two) times daily before a meal. 08/13/21 08/13/22  Charlott Rakes, MD  tadalafil (CIALIS) 5 MG tablet Take 1 tablet (5 mg total) by mouth daily as needed for erectile dysfunction. 1 hour prior to sexual activity 01/17/22   Billey Co, MD  tamsulosin (FLOMAX) 0.4 MG CAPS capsule Take 1 capsule (0.4 mg total) by mouth daily. 11/23/21   Zara Council A, PA-C  VITAMIN A PO Take 1 capsule by mouth daily.    [provider]      Allergies    Patient has no known allergies.    Review of Systems   Review of Systems  Cardiovascular:  Negative for chest pain and palpitations.  Musculoskeletal:  Positive for arthralgias and neck pain. Negative for back pain.  Neurological:  Negative for syncope.  All other systems reviewed and are negative.   Physical Exam Updated Vital Signs BP (!) 166/92 (BP Location: Left Arm)   Pulse (!) 102   Temp 98.5 F (36.9 C) (Oral)   Resp 18   Ht 5' 9"  (1.753 m)   Wt 83.9 kg   SpO2 97%   BMI 27.32 kg/m  Physical Exam Vitals and nursing note reviewed.  Constitutional:      General: He is not in acute distress.    Appearance: Normal appearance. He is not ill-appearing.  HENT:     Head: Normocephalic and atraumatic.     Nose: Nose normal.  Eyes:     Conjunctiva/sclera: Conjunctivae normal.  Pulmonary:     Effort: Pulmonary effort is normal. No respiratory distress.  Musculoskeletal:        General: No deformity.     Comments: Cervical, thoracic, lumbar spine without tenderness to palpation.  Full range of motion in bilateral upper and lower extremities with 5/5 strength in extensor and flexor muscle groups.  Sensation intact and symmetrical.  He is able ambulate without much difficulty.  Skin:    Findings: No rash.  Neurological:     Mental Status: He is alert.     ED Results / Procedures / Treatments   Labs (all labs ordered are listed, but only abnormal results are displayed) Labs Reviewed   COMPREHENSIVE METABOLIC PANEL - Abnormal; Notable for the following components:      Result Value   Glucose, Bld 122 (*)    All other components within normal limits  CBC WITH DIFFERENTIAL/PLATELET  URINALYSIS, ROUTINE W REFLEX MICROSCOPIC    EKG None  Radiology CT Cervical Spine Wo Contrast  Result Date: 04/07/2022 CLINICAL DATA:  Multiple falls EXAM: CT CERVICAL SPINE WITHOUT CONTRAST TECHNIQUE: Multidetector CT imaging of the cervical spine was performed without intravenous contrast. Multiplanar CT image reconstructions were also generated. RADIATION DOSE  REDUCTION: This exam was performed according to the departmental dose-optimization program which includes automated exposure control, adjustment of the mA and/or kV according to patient size and/or use of iterative reconstruction technique. COMPARISON:  None Available. FINDINGS: Alignment: Unchanged trace retrolisthesis of C3 on C4 and C4-C5. No new listhesis. Skull base and vertebrae: No acute fracture. No primary bone lesion or focal pathologic process. Soft tissues and spinal canal: No prevertebral fluid or swelling. No visible canal hematoma. Disc levels: Degenerative disc disease, with disc height loss most prominent at C4-C5, C5-C6, and C6-C7. Right subarticular disc extrusion at C4-C5 with vacuum disc phenomenon. There is likely moderate spinal canal stenosis at this level. Multilevel uncovertebral and facet arthropathy, which results in severe neural foraminal narrowing on the right at C4-C5 and on the left at C6-C7. Upper chest: No focal pulmonary opacity or pleural effusion. Other: None. IMPRESSION: No acute fracture or traumatic listhesis in the cervical spine. Electronically Signed   By: Merilyn Baba M.D.   On: 04/07/2022 19:00   CT Head Wo Contrast  Result Date: 04/07/2022 CLINICAL DATA:  Multiple falls in the past 2 weeks EXAM: CT HEAD WITHOUT CONTRAST TECHNIQUE: Contiguous axial images were obtained from the base of the skull  through the vertex without intravenous contrast. RADIATION DOSE REDUCTION: This exam was performed according to the departmental dose-optimization program which includes automated exposure control, adjustment of the mA and/or kV according to patient size and/or use of iterative reconstruction technique. COMPARISON:  01/04/2022 FINDINGS: Brain: No evidence of acute infarction, hemorrhage, mass, mass effect, or midline shift. No hydrocephalus or extra-axial fluid collection. Postoperative changes along the right-greater-than-left superior frontoparietal region with mild encephalomalacia, related to prior meningioma resection. Periventricular white matter changes, likely the sequela of chronic small vessel ischemic disease. Vascular: No hyperdense vessel. Skull: Vertex craniotomy.  Negative fracture or focal lesion. Sinuses/Orbits: No acute finding. Other: The mastoid air cells are well aerated. IMPRESSION: No acute intracranial process. Electronically Signed   By: Merilyn Baba M.D.   On: 04/07/2022 18:43    Procedures Procedures    Medications Ordered in ED Medications  lidocaine (LIDODERM) 5 % 1 patch (1 patch Transdermal Patch Applied 04/07/22 2109)  ketorolac (TORADOL) 15 MG/ML injection 15 mg (15 mg Intramuscular Given 04/07/22 2109)    ED Course/ Medical Decision Making/ A&P                           Medical Decision Making Risk Prescription drug management.   61 year old male presents today for evaluation of back pain.  He has had about 3 falls in the past 2 weeks.  He states all of these are mechanical.  He states he should not be helping people move however he has been.  He states he typically trips.  No loss of consciousness.  He denies any head injury.  Work-up overall reassuring.  CT scan of the head and cervical spine without acute findings.  CBC, CMP unremarkable.  Likely muscle strain of the cervical spine.  Without radicular symptoms.  Symptomatic management prescribed with Robaxin,  naproxen, lidocaine patch.  Patient is appropriate for discharge.  Discharged in stable condition.  Return precautions discussed.   Final Clinical Impression(s) / ED Diagnoses Final diagnoses:  Fall, initial encounter  Strain of neck muscle, initial encounter    Rx / DC Orders ED Discharge Orders          Ordered    methocarbamol (ROBAXIN) 500 MG tablet  2 times daily        04/07/22 2132    naproxen (NAPROSYN) 375 MG tablet  2 times daily        04/07/22 2132    lidocaine (LIDODERM) 5 %  Every 24 hours        04/07/22 2132              Evlyn Courier, PA-C 04/07/22 2140    Jeanell Sparrow, DO 04/11/22 [phone]

## 2022-04-07 NOTE — ED Triage Notes (Signed)
Patient reports falls x 3 in the past 2 weeks. Patient states he was helped up by pulling on his arms. No LOC. Patient denies  hitting his head. Patient states he takes baby Aspirin.  Patient c/o pain from mid posterior neck to the thoracic area of the back and down to both side of the rib cage.

## 2022-04-07 NOTE — Discharge Instructions (Signed)
Your work-up today was overall reassuring.  Blood work, CT scan of the head and CT scan of cervical spine did not show anything concerning.  He received a dose of Toradol, and lidocaine patch in the emergency room.  Have also sent naproxen which is an anti-inflammatory, lidocaine patch, and Robaxin into the pharmacy for you.  Robaxin is a muscle relaxer and can make you drowsy.  Do not drive, operate heavy machinery after taking it.  If you need to reserve this only for bedtime.  Concerning symptoms return to the emergency room.

## 2022-04-07 NOTE — ED Provider Triage Note (Signed)
Emergency Medicine Provider Triage Evaluation Note  Joshua [name] , a 61 y.o. male  was evaluated in triage.  Pt complains of 3 falls in the past three weeks. Patient reports that once he tripped over a rug, and others he wasn't using his cane. The last time was last weekend and he feel landing on his bottom/back. He reports that he was helped up and he is having upper shoulder/neck pain.   Review of Systems  Positive:  Negative:   Physical Exam  BP (!) 166/92 (BP Location: Left Arm)   Pulse (!) 102   Temp 98.5 F (36.9 C) (Oral)   Resp 18   Ht '5\' 9"'$  (1.753 m)   Wt 83.9 kg   SpO2 97%   BMI 27.32 kg/m  Gen:   Awake, no distress   Resp:  Normal effort  MSK:   Moves extremities without difficulty  Other:  Midline cervical pain, paraspinal thoracic pain.   Medical Decision Making  Medically screening exam initiated at 5:35 PM.  Appropriate orders placed.  SRIHARI SHELLHAMMER was informed that the remainder of the evaluation will be completed by another provider, this initial triage assessment does not replace that evaluation, and the importance of remaining in the ED until their evaluation is complete.  Will order CT head and neck   Sherrell Puller, PA-C 04/07/22 1744

## 2022-04-11 ENCOUNTER — Ambulatory Visit (INDEPENDENT_AMBULATORY_CARE_PROVIDER_SITE_OTHER): Payer: Medicare Other | Admitting: Podiatry

## 2022-04-11 DIAGNOSIS — Z91199 Patient's noncompliance with other medical treatment and regimen due to unspecified reason: Secondary | ICD-10-CM

## 2022-04-11 NOTE — Progress Notes (Signed)
No show

## 2022-04-13 ENCOUNTER — Ambulatory Visit (INDEPENDENT_AMBULATORY_CARE_PROVIDER_SITE_OTHER): Payer: Medicare Other | Admitting: Urology

## 2022-04-13 ENCOUNTER — Encounter: Payer: Self-pay | Admitting: Urology

## 2022-04-13 VITALS — BP 128/82 | HR 78 | Ht 69.0 in | Wt 185.0 lb

## 2022-04-13 DIAGNOSIS — C61 Malignant neoplasm of prostate: Secondary | ICD-10-CM

## 2022-04-13 DIAGNOSIS — R972 Elevated prostate specific antigen [PSA]: Secondary | ICD-10-CM | POA: Diagnosis not present

## 2022-04-13 MED ORDER — LEVOFLOXACIN 500 MG PO TABS
500.0000 mg | ORAL_TABLET | Freq: Once | ORAL | Status: AC
  Administered 2022-04-13: 500 mg via ORAL

## 2022-04-13 MED ORDER — GENTAMICIN SULFATE 40 MG/ML IJ SOLN
80.0000 mg | Freq: Once | INTRAMUSCULAR | Status: AC
  Administered 2022-04-13: 80 mg via INTRAMUSCULAR

## 2022-04-13 NOTE — Patient Instructions (Signed)

## 2022-04-13 NOTE — Progress Notes (Signed)
   04/13/22  Indication: PSA 3.9, abnormal prostate MRI with small PI-RADS 4 lesion left peripheral zone mid gland/apex  Prostate Biopsy Procedure   Informed consent was obtained, and we discussed the risks of bleeding and infection/sepsis. A time out was performed to ensure correct patient identity.  Pre-Procedure: - Last PSA Level: 3.9 - Gentamicin and levaquin given for antibiotic prophylaxis - Transrectal Ultrasound performed revealing a 41 gm prostate, PSA density 0.10 - No significant hypoechoic or median lobe noted, no abnormal ultrasound findings  Procedure: - Prostate block performed using 10 cc 1% lidocaine and biopsies taken from sextant areas, 12 under ultrasound guidance. 2 additional cores were taken at the left mid peripheral zone at the ROI from MRI.  Post-Procedure: - Patient tolerated the procedure well - He was counseled to seek immediate medical attention if experiences significant bleeding, fevers, or severe pain - Return in one week to discuss biopsy results  Assessment/ Plan: Will follow up in 1-2 weeks to discuss pathology  Nickolas Madrid, MD 04/13/2022

## 2022-04-15 LAB — SURGICAL PATHOLOGY

## 2022-04-21 ENCOUNTER — Ambulatory Visit (INDEPENDENT_AMBULATORY_CARE_PROVIDER_SITE_OTHER): Payer: Medicare Other | Admitting: Urology

## 2022-04-21 VITALS — BP 160/87 | HR 108 | Ht 68.0 in | Wt 185.0 lb

## 2022-04-21 DIAGNOSIS — C61 Malignant neoplasm of prostate: Secondary | ICD-10-CM | POA: Diagnosis not present

## 2022-04-21 NOTE — Patient Instructions (Signed)
Prostate Cancer  The prostate is a small gland that produces fluid that makes up semen (seminal fluid). It is located below the bladder in men, in front of the rectum. Prostate cancer is the abnormal growth of cells in the prostate gland. What are the causes? The exact cause of this condition is not known. What increases the risk? You are more likely to develop this condition if: You are 61 years of age or older. You have a family history of prostate cancer. You have a family history of breast and ovarian cancer. You have genes that are passed from parent to child (inherited), such as BRCA1 and BRCA2. You have Lynch syndrome. African American men and men of African descent are diagnosed with prostate cancer at higher rates than other men. The reasons for this are not well understood and are likely due to a combination of genetic and environmental factors. What are the signs or symptoms? Symptoms of this condition include: Problems with urination. This may include: A weak or interrupted flow of urine. Trouble starting or stopping urination. Trouble emptying the bladder all the way. The need to urinate more often, especially at night. Blood in urine or semen. Trouble getting an erection. Weakness or numbness in the legs or feet. How is this diagnosed? This condition can be diagnosed with: A digital rectal exam. For this exam, a health care provider inserts a gloved finger into the rectum to feel the prostate gland. A blood test called a prostate-specific antigen (PSA) test. A procedure in which a sample of tissue is taken from the prostate and checked under a microscope (prostate biopsy). An imaging test called transrectal ultrasonography. Once the condition is diagnosed, tests will be done to determine how far the cancer has spread. This is called staging the cancer. Staging may involve imaging tests, such as a bone scan, CT scan, PET scan, or MRI. Stages of prostate cancer The stages  of prostate cancer are as follows: Stage 1 (I). At this stage, the cancer is found in the prostate only. The cancer is not visible on imaging tests, and it is usually found by accident, such as during prostate surgery. Stage 2 (II). At this stage, the cancer is more advanced than it is in stage 1, but the cancer has not spread outside the prostate. Stage 3 (III). At this stage, the cancer has spread beyond the outer layer of the prostate to nearby tissues. The cancer may be found in the seminal vesicles, which are near the bladder and the prostate. Stage 4 (IV). At this stage, the cancer has spread to other parts of the body, such as the lymph nodes, bones, bladder, rectum, liver, or lungs. Prostate cancer grading Prostate cancer is also graded according to how the cancer cells look under a microscope. This is called the Gleason score and the total score can range from 6-10, indicating how likely it is that the cancer will spread (metastasize) to other parts of the body. The higher the score, the greater the likelihood that the cancer will spread. Gleason 6 or lower: This indicates that the cancer cells look similar to normal prostate cells (well differentiated). Gleason 7: This indicates that the cancer cells look somewhat similar to normal prostate cells (moderately differentiated). Gleason 8, 9, or 10: This indicates that the cancer cells look very different than normal prostate cells (poorly differentiated). How is this treated? Treatment for this condition depends on several factors, including the stage of the cancer, your age, personal preferences, and  your overall health. Talk with your health care provider about treatment options that are recommended for you. Common treatments include: Observation for early stage prostate cancer (active surveillance). This involves having exams, blood tests, and in some cases, more biopsies. For some men, this is the only treatment needed. Surgery. Types of  surgeries include: Open surgery (radical prostatectomy). In this surgery, a larger incision is made to remove the prostate. A laparoscopic radical prostatectomy. This is a surgery to remove the prostate and lymph nodes through several small incisions. It is often referred to as a minimally invasive surgery. A robotic radical prostatectomy. This is laparoscopic surgery to remove the prostate and lymph nodes with the help of robotic arms that are controlled by the surgeon. Cryoablation. This is surgery to freeze and destroy cancer cells. Radiation treatment. Types of radiation treatment include: External beam radiation. This type aims beams of radiation from outside the body at the prostate to destroy cancerous cells. Brachytherapy. This type uses radioactive needles, seeds, wires, or tubes that are implanted into the prostate gland. Like external beam radiation, brachytherapy destroys cancerous cells. An advantage is that this type of radiation limits the damage to surrounding tissue and has fewer side effects. Chemotherapy. This treatment kills cancer cells or stops them from multiplying. It kills both cancer cells and normal cells. Targeted therapy. This treatment uses medicines to kill cancer cells without damaging normal cells. Hormone treatment. This treatment involves taking medicines that act on testosterone, one of the male hormones, by: Stopping your body from producing testosterone. Blocking testosterone from reaching cancer cells. Follow these instructions at home: Lifestyle Do not use any products that contain nicotine or tobacco. These products include cigarettes, chewing tobacco, and vaping devices, such as e-cigarettes. If you need help quitting, ask your health care provider. Eat a healthy diet. To do this: Eat foods that are high in fiber. These include beans, whole grains, and fresh fruits and vegetables. Limit foods that are high in fat and sugar. These include fried or sweet  foods. Treatment for prostate cancer may affect sexual function. If you have a partner, continue to have intimate moments. This may include touching, holding, hugging, and caressing your partner. Get plenty of sleep. Consider joining a support group for men who have prostate cancer. Meeting with a support group may help you learn to manage the stress of having cancer. General instructions Take over-the-counter and prescription medicines only as told by your health care provider. If you have to go to the hospital, notify your cancer specialist (oncologist). Keep all follow-up visits. This is important. Where to find more information American Cancer Society: www.cancer.[place] [employer]: www.cancer.net [employer]: www.cancer.gov Contact a health care provider if: You have new or increasing trouble urinating. You have new or increasing blood in your urine. You have new or increasing pain in your hips, back, or chest. Get help right away if: You have weakness or numbness in your legs. You cannot control urination or your bowel movements (incontinence). You have chills or a fever. Summary The prostate is a small gland that is involved in the production of semen. It is located below a man's bladder, in front of the rectum. Prostate cancer is the abnormal growth of cells in the prostate gland. Treatment for this condition depends on the stage of the cancer, your age, personal preferences, and your overall health. Talk with your health care provider about treatment options that are recommended for you. Consider joining a support group  for men who have prostate cancer. Meeting with a support group may help you learn to manage the stress of having cancer. This information is not intended to replace advice given to you by your health care provider. Make sure you discuss any questions you have with your health care provider. Document Revised: 09/30/2020 Document  Reviewed: 09/30/2020 Elsevier Patient Education  [place].

## 2022-04-21 NOTE — Progress Notes (Signed)
   04/21/2022 1:25 PM   Joshua [name] Aug 10, 1959 [id number]  Reason for visit: Discuss prostate biopsy results  HPI: 61 year old male with a very slow rise in the PSA to 3.9 who requested a prostate MRI which showed a small PI-RADS 4 lesion in the left peripheral zone mid gland/apex.  He underwent a cognitive biopsy on 04/13/2022 that showed a 41 g prostate with a PSA density of 0.10, 4 total cores were positive for low risk gleason score 3+3=6 prostate cancer with 60% max core involvement.  We had a lengthy conversation today about the patient's new diagnosis of prostate cancer.  We reviewed the risk classifications per the AUA guidelines including very low risk, low risk, intermediate risk, and high risk disease, and the need for additional staging imaging with CT and bone scan in patients with unfavorable intermediate risk and high risk disease.  I explained that his life expectancy, clinical stage, Gleason score, PSA, and other co-morbidities influence treatment strategies.  We discussed the roles of active surveillance, radiation therapy, surgical therapy with robotic prostatectomy, and hormone therapy with androgen deprivation.  We discussed that patients urinary symptoms also impact treatment strategy, as patients with severe lower urinary tract symptoms may have significant worsening or even develop urinary retention after undergoing radiation.  In regards to surgery, we discussed robotic prostatectomy +/- lymphadenectomy at length.  The procedure takes 3 to 4 hours, and patient's typically discharge home on post-op day #1.  A Foley catheter is left in place for 7 to 10 days to allow for healing of the vesicourethral anastomosis.  There is a small risk of bleeding, infection, damage to surrounding structures or bowel, hernia, DVT/PE, or serious cardiac or pulmonary complications.  We discussed at length post-op side effects including erectile dysfunction, and the importance of pre-operative  erectile function on long-term outcomes.  Even with a nerve sparing approach, there is an approximately 25% rate of permanent erectile dysfunction.  We also discussed postop urinary incontinence at length.  We expect patients to have stress incontinence post-operatively that will improve over period of weeks to months.  Less than 10% of men will require a pad at 1 year after surgery.  Patients will need to avoid heavy lifting and strenuous activity for 3 to 4 weeks, but most men return to their baseline activity status by 6 weeks.  We focused on active surveillance and close monitoring of his low risk disease moving forward.  We discussed this typically includes PSA every 6 to 12 months, yearly DRE, consideration of a confirmatory biopsy within the next 18 months, and potential need to change treatment modality if PSA kinetics or change in pathology dictate.  We discussed extremely low risk of developing metastatic disease while on active surveillance, and approximately 20% chance of need for more definitive treatment in the future.  In summary, Joshua [name] is a 61 y.o. man with newly diagnosed low risk prostate cancer. He would like to pursue active surveillance.  We discussed need for repeat PSA in 6 months and routinely moving forward, as well as need for confirmatory biopsy in the next 12 to 18 months, or if has significant rise in the PSA  I spent 35 total minutes on the day of the encounter including pre-visit review of the medical record, face-to-face time with the patient, and post visit ordering of labs/imaging/tests.    Billey Co, [facility] [address], [place], [place] [address] [phone]

## 2022-05-02 ENCOUNTER — Ambulatory Visit (INDEPENDENT_AMBULATORY_CARE_PROVIDER_SITE_OTHER): Payer: Medicare Other | Admitting: Podiatry

## 2022-05-02 DIAGNOSIS — L600 Ingrowing nail: Secondary | ICD-10-CM

## 2022-05-02 NOTE — Progress Notes (Signed)
Subjective:   Patient ID: Joshua [name], male   DOB: 61 y.o.   MRN: [id number]   HPI Patient presents stating that the nailbed that was removed is slightly tender crusted over and is concerned about any kind of infection   ROS      Objective:  Physical Exam  Neurovascular status intact negative Bevelyn Buckles' sign noted with a crusted nail site left second nail that is localized there is no proximal edema edema drainage noted and there is some slight pressure with walking associated with this      Assessment:  Irritation that is more scab related than it is probably related to any pathology     Plan:  Reviewed condition and recommended keeping the area clean and I applied cushioning to take pressure off the nail site and patient will be seen back as needed should heal uneventfully over the next couple weeks

## 2022-05-09 DIAGNOSIS — M62838 Other muscle spasm: Secondary | ICD-10-CM | POA: Diagnosis not present

## 2022-05-09 DIAGNOSIS — R002 Palpitations: Secondary | ICD-10-CM | POA: Diagnosis not present

## 2022-05-09 DIAGNOSIS — Z23 Encounter for immunization: Secondary | ICD-10-CM | POA: Diagnosis not present

## 2022-05-09 DIAGNOSIS — I8390 Asymptomatic varicose veins of unspecified lower extremity: Secondary | ICD-10-CM | POA: Diagnosis not present

## 2022-05-19 ENCOUNTER — Other Ambulatory Visit: Payer: Self-pay | Admitting: Family Medicine

## 2022-05-19 ENCOUNTER — Other Ambulatory Visit: Payer: Self-pay

## 2022-05-19 ENCOUNTER — Emergency Department (HOSPITAL_COMMUNITY): Payer: Medicare Other

## 2022-05-19 ENCOUNTER — Encounter (HOSPITAL_COMMUNITY): Payer: Self-pay

## 2022-05-19 ENCOUNTER — Emergency Department (HOSPITAL_COMMUNITY)
Admission: EM | Admit: 2022-05-19 | Discharge: 2022-05-19 | Disposition: A | Discharge status: Home / Self Care | Payer: Medicare Other | Attending: Emergency Medicine | Admitting: Emergency Medicine

## 2022-05-19 DIAGNOSIS — Z7984 Long term (current) use of oral hypoglycemic drugs: Secondary | ICD-10-CM | POA: Diagnosis not present

## 2022-05-19 DIAGNOSIS — R0602 Shortness of breath: Secondary | ICD-10-CM | POA: Insufficient documentation

## 2022-05-19 DIAGNOSIS — R072 Precordial pain: Secondary | ICD-10-CM | POA: Insufficient documentation

## 2022-05-19 DIAGNOSIS — Z7951 Long term (current) use of inhaled steroids: Secondary | ICD-10-CM | POA: Insufficient documentation

## 2022-05-19 DIAGNOSIS — R079 Chest pain, unspecified: Secondary | ICD-10-CM

## 2022-05-19 DIAGNOSIS — Z79899 Other long term (current) drug therapy: Secondary | ICD-10-CM | POA: Insufficient documentation

## 2022-05-19 DIAGNOSIS — J449 Chronic obstructive pulmonary disease, unspecified: Secondary | ICD-10-CM | POA: Insufficient documentation

## 2022-05-19 DIAGNOSIS — R0789 Other chest pain: Secondary | ICD-10-CM | POA: Diagnosis not present

## 2022-05-19 DIAGNOSIS — Z7982 Long term (current) use of aspirin: Secondary | ICD-10-CM | POA: Insufficient documentation

## 2022-05-19 DIAGNOSIS — R7303 Prediabetes: Secondary | ICD-10-CM

## 2022-05-19 DIAGNOSIS — I10 Essential (primary) hypertension: Secondary | ICD-10-CM | POA: Diagnosis not present

## 2022-05-19 LAB — CBC
HCT: 45.3 % (ref 39.0–52.0)
Hemoglobin: 15 g/dL (ref 13.0–17.0)
MCH: 31 pg (ref 26.0–34.0)
MCHC: 33.1 g/dL (ref 30.0–36.0)
MCV: 93.6 fL (ref 80.0–100.0)
Platelets: 237 10*3/uL (ref 150–400)
RBC: 4.84 MIL/uL (ref 4.22–5.81)
RDW: 12.8 % (ref 11.5–15.5)
WBC: 5.6 10*3/uL (ref 4.0–10.5)
nRBC: 0 % (ref 0.0–0.2)

## 2022-05-19 LAB — BASIC METABOLIC PANEL
Anion gap: 6 (ref 5–15)
BUN: 16 mg/dL (ref 8–23)
CO2: 25 mmol/L (ref 22–32)
Calcium: 9.1 mg/dL (ref 8.9–10.3)
Chloride: 110 mmol/L (ref 98–111)
Creatinine, Ser: 0.99 mg/dL (ref 0.61–1.24)
GFR, Estimated: >60 mL/min (ref >60)
Glucose, Bld: 111 mg/dL — ABNORMAL HIGH (ref 70–99)
Potassium: 4.3 mmol/L (ref 3.5–5.1)
Sodium: 141 mmol/L (ref 135–145)

## 2022-05-19 LAB — TROPONIN I (HIGH SENSITIVITY)
Troponin I (High Sensitivity): 5 ng/L (ref <18)
Troponin I (High Sensitivity): 4 ng/L (ref <18)

## 2022-05-19 MED ORDER — KETOROLAC TROMETHAMINE 15 MG/ML IJ SOLN
15.0000 mg | Freq: Once | INTRAMUSCULAR | Status: AC
  Administered 2022-05-19: 15 mg via INTRAVENOUS
  Filled 2022-05-19: qty 1

## 2022-05-19 NOTE — ED Provider Notes (Signed)
[facility] DEPT Provider Note   CSN: [id number] Arrival date & time: 05/19/22  3009     History HTN,Seizures Chief Complaint  Patient presents with   Chest Pain    Joshua [name] is a 61 y.o. male.  61 y.o male with a PMH of HTN, Seizres, meningioma presents to the ED with a chief complaint of chest pain x this morning. Patient reports waking up this morning and felt a sudden onset of sharp stabbing sensation to the substernal area of his chest. He took three 81 mg aspirin as a precautions and is unsure whether this helped him with the pain. He also endorses some passive shortness of breath accompanied by the pain. There are no exacerbating factors. He is wondering if the pain occurred from him twisting upon waking up. Prior hx of holter monitor due to unknown afib versus aflutter but with no definite diagnosis. No recent illness, no cough or fever. No prior hx of CAD, or CHF. Denies any leg swelling.   The history is provided by the patient.  Chest Pain Pain location:  Substernal area Pain quality: sharp and stabbing   Associated symptoms: shortness of breath   Associated symptoms: no abdominal pain, no back pain, no fever, no nausea, no palpitations and no vomiting        Home Medications Prior to Admission medications   Medication Sig Start Date End Date Taking? Authorizing Provider  Accu-Chek Softclix Lancets lancets Use as instructed 08/13/21   Charlott Rakes, MD  acetaminophen (TYLENOL) 500 MG tablet Take 500 mg by mouth every 6 (six) hours as needed for moderate pain or headache.    [provider]  albuterol (VENTOLIN HFA) 108 (90 Base) MCG/ACT inhaler Inhale 2 puffs into the lungs every 6 (six) hours as needed for wheezing or shortness of breath. 08/13/21   Newlin, Enobong, MD  amLODipine (NORVASC) 5 MG tablet TAKE 1 TABLET (5 MG TOTAL) BY MOUTH DAILY. TO LOWER BLOOD PRESSURE. STOP LOSARTAN 08/13/21   Charlott Rakes, MD  aspirin  81 MG chewable tablet Chew 81 mg by mouth daily.    [provider]  atorvastatin (LIPITOR) 20 MG tablet TAKE 1 TABLET (20 MG TOTAL) BY MOUTH DAILY. 12/08/21   Charlott Rakes, MD  Blood Glucose Monitoring Suppl (ACCU-CHEK GUIDE ME) w/Device KIT Use to check blood sugar once daily. 01/07/21   Charlott Rakes, MD  cetirizine (ZYRTEC) 10 MG tablet Take 1 tablet (10 mg total) by mouth daily. 07/27/21   Charlott Rakes, MD  clonazePAM (KLONOPIN) 0.5 MG tablet Take 1 tablet (0.5 mg total) by mouth at bedtime. 09/27/21   Vaslow, Acey Lav, MD  diazepam (VALIUM) 2 MG tablet Take 1 tablet (2 mg total) by mouth 2 (two) times daily. 08/15/21   Valarie Merino, MD  glucose blood (ACCU-CHEK GUIDE) test strip Use to check blood sugar once daily. 08/13/21   Charlott Rakes, MD  ketoconazole (NIZORAL) 2 % cream APPLY 1 APPLICATION. TOPICALLY DAILY. 02/28/22   Lorenda Peck, DPM  Lacosamide 100 MG TABS TAKE 1 TABLET (100 MG TOTAL) BY MOUTH IN THE MORNING AND AT BEDTIME. 03/01/22   Vaslow, Acey Lav, MD  lamoTRIgine (LAMICTAL) 200 MG tablet Take 1 tablet (200 mg total) by mouth 2 (two) times daily. 03/28/22   Vaslow, Acey Lav, MD  lidocaine (LIDODERM) 5 % Place 1 patch onto the skin daily. Remove & Discard patch within 12 hours or as directed by MD 04/07/22   Evlyn Courier, PA-C  meclizine (ANTIVERT) 25 MG tablet Take 1 tablet (25 mg total) by mouth 3 (three) times daily as needed for dizziness. 01/04/22   Horton, Alvin Critchley, DO  metFORMIN (GLUCOPHAGE) 500 MG tablet Take 1 tablet (500 mg total) by mouth 2 (two) times daily. 08/13/21   Charlott Rakes, MD  methocarbamol (ROBAXIN) 500 MG tablet Take 1 tablet (500 mg total) by mouth 2 (two) times daily. 04/07/22   Evlyn Courier, PA-C  naproxen (NAPROSYN) 375 MG tablet Take 1 tablet (375 mg total) by mouth 2 (two) times daily. 04/07/22   Deatra Canter, Amjad, PA-C  pantoprazole (PROTONIX) 20 MG tablet Take 1 tablet (20 mg total) by mouth 2 (two) times daily before a meal. 08/13/21 08/13/22   Charlott Rakes, MD  tadalafil (CIALIS) 5 MG tablet Take 1 tablet (5 mg total) by mouth daily as needed for erectile dysfunction. 1 hour prior to sexual activity 01/17/22   Billey Co, MD  tamsulosin (FLOMAX) 0.4 MG CAPS capsule Take 1 capsule (0.4 mg total) by mouth daily. 11/23/21   Zara Council A, PA-C  VITAMIN A PO Take 1 capsule by mouth daily.    [provider]      Allergies    Patient has no known allergies.    Review of Systems   Review of Systems  Constitutional:  Negative for chills and fever.  Respiratory:  Positive for shortness of breath. Negative for wheezing.   Cardiovascular:  Positive for chest pain. Negative for palpitations and leg swelling.  Gastrointestinal:  Negative for abdominal pain, diarrhea, nausea and vomiting.  Genitourinary:  Negative for flank pain.  Musculoskeletal:  Negative for back pain.  Skin:  Negative for pallor and wound.  All other systems reviewed and are negative.   Physical Exam Updated Vital Signs BP 108/69 (BP Location: Left Arm)   Pulse 67   Temp 98 F (36.7 C) (Oral)   Resp 15   Ht 5' 8" (1.727 m)   Wt 83.9 kg   SpO2 98%   BMI 28.13 kg/m  Physical Exam Vitals and nursing note reviewed.  Constitutional:      Appearance: He is well-developed.  HENT:     Head: Normocephalic and atraumatic.  Eyes:     General: No scleral icterus.    Pupils: Pupils are equal, round, and reactive to light.  Cardiovascular:     Heart sounds: Normal heart sounds.  Pulmonary:     Effort: Pulmonary effort is normal.     Breath sounds: Normal breath sounds. No wheezing.  Chest:     Chest wall: No tenderness.  Abdominal:     General: Bowel sounds are normal. There is no distension.     Palpations: Abdomen is soft.     Tenderness: There is no abdominal tenderness.  Musculoskeletal:        General: No tenderness or deformity.     Cervical back: Normal range of motion.  Skin:    General: Skin is warm and dry.  Neurological:      Mental Status: He is alert and oriented to person, place, and time.     ED Results / Procedures / Treatments   Labs (all labs ordered are listed, but only abnormal results are displayed) Labs Reviewed  BASIC METABOLIC PANEL - Abnormal; Notable for the following components:      Result Value   Glucose, Bld 111 (*)    All other components within normal limits  CBC  TROPONIN I (HIGH SENSITIVITY)  TROPONIN I (HIGH  SENSITIVITY)    EKG EKG Interpretation  Date/Time:  Thursday May 19 2022 07:24:41 EDT Ventricular Rate:  66 PR Interval:  164 QRS Duration: 132 QT Interval:  426 QTC Calculation: 447 R Axis:   79 Text Interpretation: Sinus rhythm Right bundle branch block Confirmed by Lajean Saver [phone]) on 05/19/2022 7:51:59 AM  Radiology DG Chest 2 View  Result Date: 05/19/2022 CLINICAL DATA:  Chest pain EXAM: CHEST - 2 VIEW COMPARISON:  Prior chest x-ray 11/30/2021 FINDINGS: The lungs are clear and negative for focal airspace consolidation, pulmonary edema or suspicious pulmonary nodule. No pleural effusion or pneumothorax. Cardiac and mediastinal contours are within normal limits. No acute fracture or lytic or blastic osseous lesions. The visualized upper abdominal bowel gas pattern is unremarkable. Surgical changes of prior right shoulder arthroplasty. IMPRESSION: No active cardiopulmonary disease. Electronically Signed   By: Jacqulynn Cadet M.D.   On: 05/19/2022 08:02    Procedures Procedures    Medications Ordered in ED Medications  ketorolac (TORADOL) 15 MG/ML injection 15 mg (15 mg Intravenous Given 05/19/22 1001)    ED Course/ Medical Decision Making/ A&P                           Medical Decision Making Amount and/or Complexity of Data Reviewed Labs: ordered. Radiology: ordered.  Risk Prescription drug management.     This patient presents to the ED for concern of chest pain, this involves a number of treatment options, and is a complaint that carries  with it a high risk of complications and morbidity.  The differential diagnosis includes ACS, Pulmonary embolism, reflux versus pneumonia.    Co morbidities: Discussed in HPI   Brief History:  Patient with prior history of seizures, hypertension here with sudden onset of chest pain which began today upon waking up, stabbing sensation to the substernal area with no radiation.  Some improvement after 3 baby aspirins.  Previous cardiology work-up remarkable for Holter monitor although no records indicated of this.  Patient without any leg swelling, no cough or infectious signs.  EMR reviewed including pt PMHx, past surgical history and past visits to ER.   See HPI for more details   Lab Tests:  I ordered and independently interpreted labs.  The pertinent results include:    I personally reviewed all laboratory work and imaging. Metabolic panel without any acute abnormality specifically kidney function within normal limits and no significant electrolyte abnormalities. CBC without leukocytosis or significant anemia.  First troponin was 5-second 1 obtained was 4.   Imaging Studies:  NAD. I personally reviewed all imaging studies and no acute abnormality found. I agree with radiology interpretation.    Cardiac Monitoring:  The patient was maintained on a cardiac monitor.  I personally viewed and interpreted the cardiac monitored which showed an underlying rhythm of: NSR EKG non-ischemic   Medicines ordered:  I ordered medication including toradol  for pain control Reevaluation of the patient after these medicines showed that the patient resolved I have reviewed the patients home medicines and have made adjustments as needed  Reevaluation:  After the interventions noted above I re-evaluated patient and found that they have :resolved   Social Determinants of Health:  The patient's social determinants of health were a factor in the care of this patient    Problem List / ED  Course:  Patient here with sudden onset of chest pain which began this morning after he woke up, taking 81 mg  aspirin x3 with some improvement in his symptoms.  Arrived to the ED hemodynamically stable with no tachycardia or hypoxia.  Does endorse some shortness of breath, upon review of records patient does have a history of COPD does use an inhaler for comfort. Extensive chart review by me, prior visit to cardiology in the month of May 2023 with Dr. Radford Pax who indicates patient does have prior family history of premature CAD, he does have aortic arthrosclerosis, does look like he was having some flutter episodes back then and was instructed to wear a Holter monitor, however I do not think that patient ended up wearing this monitor as there are no results provided.  He did have a coronary calcium score of zero then. No coronary calcifications noted then on his workup.  Interpretation of his blood work by me reveal unremarkable CBC, CMP without any electrolyte derangement.  Troponin x2 have remained flat.  EKG was normal sinus rhythm and nonischemic.  Heart rate calculated at 3.  X-ray without any signs of pneumonia or infection.  Given Toradol for pain control with resolution in symptoms. Discussed close follow-up with cardiologist, PCP.  Patient's vitals are within normal limits, he is afebrile, nontachycardic.  No hypoxia or prior history of blood clots to suggest pulmonary embolism.  Does not wax and wane with eating I do not suspect reflux at this time.  Patient is agreeable to plan and treatment, patient stable for discharge.   Dispostion:  After consideration of the diagnostic results and the patients response to treatment, I feel that the patent would benefit from outpatient follow-up with cardiology versus primary care physician.  He is agreeable to plan and treatment.     Portions of this note were generated with Lobbyist. Dictation errors may occur despite best attempts at  proofreading.   Final Clinical Impression(s) / ED Diagnoses Final diagnoses:  Chest pain, unspecified type    Rx / DC Orders ED Discharge Orders     None         Janeece Fitting, PA-C 05/19/22 1038    Lajean Saver, MD 05/19/22 1450

## 2022-05-19 NOTE — Discharge Instructions (Addendum)
Your laboratory results are within normal limits today.  Please schedule an appointment with your primary care physician or cardiologist for further evaluation of your symptoms.

## 2022-05-19 NOTE — ED Provider Triage Note (Signed)
Emergency Medicine Provider Triage Evaluation Note  Joshua [name] , a 61 y.o. male  was evaluated in triage.  Pt complains of sudden onset of sharp stabbing pain to the substernal area of his chest with any radiation.  He did take 3 baby aspirin's 81 mg with some resolution in symptoms.  Also endorsing some shortness of breath.  Prior history of A-fib versus a flutter he is unsure and has not had any follow-up with cardiology after wearing a Holter monitor via PCP.  Prior history of CAD, no prior history of CHF.  No recent fevers or coughs.  Review of Systems  Positive: Chest pain, sob Negative: Fever, cough  Physical Exam  BP (!) 142/90 (BP Location: Left Arm)   Pulse 77   Temp 97.9 F (36.6 C) (Oral)   Resp 17   Ht '5\' 8"'$  (1.727 m)   Wt 83.9 kg   SpO2 98%   BMI 28.13 kg/m  Gen:   Awake, no distress   Resp:  Normal effort  MSK:   Moves extremities without difficulty  Other:  Lungs are clear to auscultation, no bilateral lower extremity pitting edema.  Medical Decision Making  Medically screening exam initiated at 7:09 AM.  Appropriate orders placed.  TORREZ RENFROE was informed that the remainder of the evaluation will be completed by another provider, this initial triage assessment does not replace that evaluation, and the importance of remaining in the ED until their evaluation is complete.     Janeece Fitting, PA-C 05/19/22 [phone]

## 2022-05-19 NOTE — ED Triage Notes (Signed)
Pt reports with sharp center chest pains that started after getting up today.

## 2022-05-30 DIAGNOSIS — Z86010 Personal history of colonic polyps: Secondary | ICD-10-CM | POA: Diagnosis not present

## 2022-05-31 ENCOUNTER — Encounter: Payer: Self-pay | Admitting: Nurse Practitioner

## 2022-05-31 ENCOUNTER — Ambulatory Visit: Payer: Medicare Other | Attending: Nurse Practitioner | Admitting: Nurse Practitioner

## 2022-05-31 VITALS — BP 138/68 | HR 89 | Ht 70.0 in | Wt 191.0 lb

## 2022-05-31 DIAGNOSIS — I700 Atherosclerosis of aorta: Secondary | ICD-10-CM

## 2022-05-31 DIAGNOSIS — I10 Essential (primary) hypertension: Secondary | ICD-10-CM | POA: Diagnosis not present

## 2022-05-31 DIAGNOSIS — E785 Hyperlipidemia, unspecified: Secondary | ICD-10-CM | POA: Diagnosis not present

## 2022-05-31 DIAGNOSIS — R002 Palpitations: Secondary | ICD-10-CM | POA: Diagnosis not present

## 2022-05-31 MED ORDER — PROPRANOLOL HCL 10 MG PO TABS
10.0000 mg | ORAL_TABLET | Freq: Three times a day (TID) | ORAL | 11 refills | Status: AC | PRN
Start: 2022-05-31 — End: ?

## 2022-05-31 NOTE — Patient Instructions (Addendum)
Medication Instructions:  Take propranolol 10 mg up to 3 times per day as needed for palpitations *If you need a refill on your cardiac medications before your next appointment, please call your pharmacy*   Lab Work: None Ordered If you have labs (blood work) drawn today and your tests are completely normal, you will receive your results only by: Mona (if you have MyChart) OR A paper copy in the mail If you have any lab test that is abnormal or we need to change your treatment, we will call you to review the results.   Testing/Procedures: None Ordered   Follow-Up: At [facility], you and your health needs are our priority.  As part of our continuing mission to provide you with exceptional heart care, we have created designated Provider Care Teams.  These Care Teams include your primary Cardiologist (physician) and Advanced Practice Providers (APPs -  Physician Assistants and Nurse Practitioners) who all work together to provide you with the care you need, when you need it.  We recommend signing up for the patient portal called "MyChart".  Sign up information is provided on this After Visit Summary.  MyChart is used to connect with patients for Virtual Visits (Telemedicine).  Patients are able to view lab/test results, encounter notes, upcoming appointments, etc.  Non-urgent messages can be sent to your provider as well.   To learn more about what you can do with MyChart, go to [email or web address].    Your next appointment:   6 month(s)  The format for your next appointment:   In Person  Provider:   Fransico Him, MD  or Christen Bame, NP  Other Instructions   Important Information About Sugar

## 2022-05-31 NOTE — Progress Notes (Signed)
Cardiology Office Note:    Date:  05/31/2022   ID:  [name], DOB Oct 14, 1960, MRN [id number]  PCP:  Pediatrics, [place] Providers Cardiologist:  Fransico Him, MD     Referring MD: Pediatrics, [place] [facility]*   Chief Complaint: Follow-up hypertension, palpitations  History of Present Illness:    Joshua [name] is a very pleasant 61 y.o. male with a hx of aortic atherosclerosis, HTN, malignant meningioma s/p resection 2014 and reoccurrence 12/2018 s/p resection with seizures, palpitations, and family history early CAD.   Referred to cardiology by PCP for evaluation of aortic atherosclerosis and seen by Dr. Radford Pax 12/10/2019. He reported occasional fluttering in chest that occurs 1-2 times monthly and last for 30 seconds then resolves.Aortic atherosclerosis noted on chest CTA 02/2019, no mention of coronary artery calcifications.  Strong family history of his brothers and father having MIs at an early age.  Nuclear stress test 12/2019 with no evidence of ischemia, low risk. Coronary calcium score of 0 12/2019.  Cardiac monitor 01/2020 unremarkable.  Last cardiology clinic visit was 12/14/21 with Dr. Radford Pax at which time he reported a fluttering sensation from time to time and event monitor was repeated.  Cardiac monitor 02/19/2022 revealed predominant normal sinus rhythm with average HR 72 bpm, rare PAC.  Today, he is here alone for routine follow-up. Reports palpitations occur a few times per week but do not last for long periods of time. Do not limit his activity.  States sometimes they catch him off guard and generally last for a few seconds[facility], does cardio exercise 30 minutes in pool or cycling as well as other exercises. He denies chest pain, shortness of breath, lower extremity edema, fatigue, melena, hematuria, hemoptysis, diaphoresis, weakness, presyncope, syncope, orthopnea, and PND. Wants to ensure that his heart is healthy so that he  does not have a cardiac event unexpectedly.   Past Medical History:  Diagnosis Date   Anxiety    Brain tumor ([place]) 02/20/2013   brain tumor removed in March 2014, Dr Donald Pore   Diffuse idiopathic skeletal hyperostosis 06/05/2013   Dizziness    Enlarged prostate    GERD (gastroesophageal reflux disease)    Headache(784.0)    scattered   High cholesterol    Hypertension    Malignant meningioma of meninges of brain ([place]) 03/07/2019   Meningioma ([place])    Meningioma, recurrent of brain ([place]) 01/31/2019   Neuropathy 12/05/2019   Pre-diabetes    Rotator cuff tear    right   Seizures ([place])    11/27/19 last sz 1 wk ago    Past Surgical History:  Procedure Laterality Date   APPLICATION OF CRANIAL NAVIGATION N/A 01/10/2019   Procedure: APPLICATION OF CRANIAL NAVIGATION;  Surgeon: Erline Levine, MD;  Location: [facility];  Service: Neurosurgery;  Laterality: N/A;   CATARACT EXTRACTION Right    CRANIOTOMY Right 11/06/2012   Procedure: CRANIOTOMY TUMOR EXCISION;  Surgeon: Erline Levine, MD;  Location: [place];  Service: Neurosurgery;  Laterality: Right;  Right Parasagittal craniotomy for meningioma with Stealth   CRANIOTOMY Right 01/10/2019   Procedure: Right Parasagittal Craniotomy for Tumor;  Surgeon: Erline Levine, MD;  Location: [facility];  Service: Neurosurgery;  Laterality: Right;  Right parasagittal craniotomy for tumor   ESOPHAGOGASTRODUODENOSCOPY     JOINT REPLACEMENT Right 2018   right hip   REVERSE SHOULDER ARTHROPLASTY Right 01/08/2021   Procedure: RIGHT REVERSE SHOULDER ARTHROPLASTY;  Surgeon: Meredith Pel,  MD;  Location: [facility];  Service: Orthopedics;  Laterality: Right;   right knee arthroscopy     SHOULDER ARTHROSCOPY Right 06/09/2020   Procedure: RIGHT SHOULDER ARTHROSCOPY AND DEBRIDEMENT;  Surgeon: Newt Minion, MD;  Location: [facility];  Service: Orthopedics;  Laterality: Right;    Current Medications: Current Meds  Medication Sig   Accu-Chek  Softclix Lancets lancets Use as instructed   acetaminophen (TYLENOL) 500 MG tablet Take 500 mg by mouth every 6 (six) hours as needed for moderate pain or headache.   albuterol (VENTOLIN HFA) 108 (90 Base) MCG/ACT inhaler Inhale 2 puffs into the lungs every 6 (six) hours as needed for wheezing or shortness of breath.   amLODipine (NORVASC) 5 MG tablet TAKE 1 TABLET (5 MG TOTAL) BY MOUTH DAILY. TO LOWER BLOOD PRESSURE. STOP LOSARTAN   aspirin 81 MG chewable tablet Chew 81 mg by mouth daily.   atorvastatin (LIPITOR) 20 MG tablet TAKE 1 TABLET (20 MG TOTAL) BY MOUTH DAILY.   Blood Glucose Monitoring Suppl (ACCU-CHEK GUIDE ME) w/Device KIT Use to check blood sugar once daily.   cetirizine (ZYRTEC) 10 MG tablet Take 1 tablet (10 mg total) by mouth daily.   clonazePAM (KLONOPIN) 0.5 MG tablet Take 1 tablet (0.5 mg total) by mouth at bedtime.   diazepam (VALIUM) 2 MG tablet Take 1 tablet (2 mg total) by mouth 2 (two) times daily.   glucose blood (ACCU-CHEK GUIDE) test strip Use to check blood sugar once daily.   ketoconazole (NIZORAL) 2 % cream APPLY 1 APPLICATION. TOPICALLY DAILY.   Lacosamide 100 MG TABS TAKE 1 TABLET (100 MG TOTAL) BY MOUTH IN THE MORNING AND AT BEDTIME.   lamoTRIgine (LAMICTAL) 200 MG tablet Take 1 tablet (200 mg total) by mouth 2 (two) times daily.   lidocaine (LIDODERM) 5 % Place 1 patch onto the skin daily. Remove & Discard patch within 12 hours or as directed by MD   meclizine (ANTIVERT) 25 MG tablet Take 1 tablet (25 mg total) by mouth 3 (three) times daily as needed for dizziness.   metFORMIN (GLUCOPHAGE) 500 MG tablet Take 1 tablet (500 mg total) by mouth 2 (two) times daily.   methocarbamol (ROBAXIN) 500 MG tablet Take 1 tablet (500 mg total) by mouth 2 (two) times daily.   naproxen (NAPROSYN) 375 MG tablet Take 1 tablet (375 mg total) by mouth 2 (two) times daily.   pantoprazole (PROTONIX) 20 MG tablet Take 1 tablet (20 mg total) by mouth 2 (two) times daily before a meal.    propranolol (INDERAL) 10 MG tablet Take 1 tablet (10 mg total) by mouth 3 (three) times daily as needed (palpitations).   tadalafil (CIALIS) 5 MG tablet Take 1 tablet (5 mg total) by mouth daily as needed for erectile dysfunction. 1 hour prior to sexual activity   tamsulosin (FLOMAX) 0.4 MG CAPS capsule Take 1 capsule (0.4 mg total) by mouth daily.   VITAMIN A PO Take 1 capsule by mouth daily.     Allergies:   Patient has no known allergies.   Social History   Socioeconomic History   Marital status: Legally Separated    Spouse name: Not on file   Number of children: 2   Years of education: Not on file   Highest education level: Not on file  Occupational History   Not on file  Tobacco Use   Smoking status: Former    Types: Cigarettes    Quit date: 01/29/2019    Years  since quitting: 3.3    Passive exposure: Past   Smokeless tobacco: Never   Tobacco comments:    weekend smoker  Vaping Use   Vaping Use: Never used  Substance and Sexual Activity   Alcohol use: Not Currently    Comment: social   Drug use: No   Sexual activity: Not Currently  Other Topics Concern   Not on file  Social History Narrative      Caffeine- soda occass   Social Determinants of Health   Financial Resource Strain: Not on file  Food Insecurity: Not on file  Transportation Needs: No Transportation Needs (01/31/2019)   PRAPARE - Hydrologist (Medical): No    Lack of Transportation (Non-Medical): No  Physical Activity: Not on file  Stress: Not on file  Social Connections: Not on file     Family History: The patient's family history includes CAD in his brother and father; Dementia in his mother; Diabetes in his father and mother; Hypertension in his father and mother.  ROS:   Please see the history of present illness.    + occasional palpitations All other systems reviewed and are negative.  Labs/Other Studies Reviewed:    The following studies were reviewed  today:  Cardiac Monitor 02/19/22   Predominat rhythm was normal sinus rhythm with average heart rate 72bpm and ranged from 47 to 122bpm.   Rare PAC  Cardiac Monitor 01/22/2020  Sinus bradycardia to sinus tachycardia. No atrial fibrillation, no arrhythmias.   Normal 30 day event monitor with no evidence for atrial fibrillation.  CT Cardiac Score 12/18/2019  Non-cardiac: See separate report from [facility].   Ascending Aorta: Normal caliber.  No calcifications.   Pericardium: Normal   Coronary arteries: Normal coronary origins.   IMPRESSION: Coronary calcium score of 0. This was 0 percentile for age and sex matched control.  Nuclear Stress Test 12/18/2019  Nuclear stress EF: 60%. There was no ST segment deviation noted during stress. No T wave inversion was noted during stress. The study is normal. This is a low risk study. The left ventricular ejection fraction is normal (55-65%).    Recent Labs: 04/07/2022: ALT 22 05/19/2022: BUN 16; Creatinine, Ser 0.99; Hemoglobin 15.0; Platelets 237; Potassium 4.3; Sodium 141  Recent Lipid Panel    Component Value Date/Time   CHOL 116 12/27/2021 1218   TRIG 51 12/27/2021 1218   HDL 64 12/27/2021 1218   CHOLHDL 1.8 12/27/2021 1218   LDLCALC 40 12/27/2021 1218     Risk Assessment/Calculations:      Physical Exam:    VS:  BP 138/68   Pulse 89   Ht _0  (1.778 m)   Wt 191 lb (86.6 kg)   SpO2 95%   BMI 27.41 kg/m     Wt Readings from Last 3 Encounters:  05/31/22 191 lb (86.6 kg)  05/19/22 185 lb (83.9 kg)  04/21/22 185 lb (83.9 kg)     GEN:  Well nourished, well developed in no acute distress HEENT: Normal NECK: No JVD; No carotid bruits CARDIAC: RRR, no murmurs, rubs, gallops RESPIRATORY:  Clear to auscultation without rales, wheezing or rhonchi  ABDOMEN: Soft, non-tender, non-distended MUSCULOSKELETAL:  No edema; No deformity. 2+ pedal pulses, equal bilaterally SKIN: Warm and dry NEUROLOGIC:  Alert and  oriented x 3 PSYCHIATRIC:  Normal affect   EKG:  EKG is not ordered today.    Diagnoses:    1. Aortic atherosclerosis (HCC)   2. Palpitations  3. Essential hypertension   4. Hyperlipidemia LDL goal <70    Assessment and Plan:     Palpitations: Intermittent, short episodes of palpitations but is concerned because they catch him off guard. Cardiac monitor 02/2022 revealed NSR with occasional PACs, no arrhythmia. He would like to try propranolol 10 mg up to 3 times daily as needed for palpitations.   Aortic atherosclerosis/Hyperlipidemia LDL goal < 70: LDL 40 on 12/27/21. We discussed the finding of aortic atherosclerosis on imaging along with coronary calcium score of 0. Lipids are well-controlled. Continue atorvastatin.   Hypertension: BP is well-controlled. No medication changes today.   Cardiac risk counseling: He is concerned due to strong family history of early CAD.  We discussed the findings on his cardiac testing including coronary calcium score of 0, and nuclear stress test with no evidence of ischemia or infarction and normal LV function.  He is currently physically active on a routine basis and is asymptomatic. Advised him to notify us of concerning symptoms prior to next appointment.     Disposition: 6 months with Dr. Radford Pax or APP  Medication Adjustments/Labs and Tests Ordered: Current medicines are reviewed at length with the patient today.  Concerns regarding medicines are outlined above.  No orders of the defined types were placed in this encounter.  Meds ordered this encounter  Medications   propranolol (INDERAL) 10 MG tablet    Sig: Take 1 tablet (10 mg total) by mouth 3 (three) times daily as needed (palpitations).    Dispense:  60 tablet    Refill:  11    Order Specific Question:   Supervising Provider    Answer:   Thayer Headings [phone]    Patient Instructions  Medication Instructions:  Take propranolol 10 mg up to 3 times per day as needed for  palpitations *If you need a refill on your cardiac medications before your next appointment, please call your pharmacy*   Lab Work: None Ordered If you have labs (blood work) drawn today and your tests are completely normal, you will receive your results only by: [place] (if you have MyChart) OR A paper copy in the mail If you have any lab test that is abnormal or we need to change your treatment, we will call you to review the results.   Testing/Procedures: None Ordered   Follow-Up: At [facility], you and your health needs are our priority.  As part of our continuing mission to provide you with exceptional heart care, we have created designated Provider Care Teams.  These Care Teams include your primary Cardiologist (physician) and Advanced Practice Providers (APPs -  Physician Assistants and Nurse Practitioners) who all work together to provide you with the care you need, when you need it.  We recommend signing up for the patient portal called "MyChart".  Sign up information is provided on this After Visit Summary.  MyChart is used to connect with patients for Virtual Visits (Telemedicine).  Patients are able to view lab/test results, encounter notes, upcoming appointments, etc.  Non-urgent messages can be sent to your provider as well.   To learn more about what you can do with MyChart, go to [email or web address].    Your next appointment:   6 month(s)  The format for your next appointment:   In Person  Provider:   Fransico Him, MD  or Christen Bame, NP  Other Instructions   Important Information About Sugar         Signed, Sneha Willig, Lanice Schwab, NP  05/31/2022 5:55 PM    [facility] '

## 2022-06-04 ENCOUNTER — Encounter (HOSPITAL_COMMUNITY): Payer: Self-pay

## 2022-06-04 ENCOUNTER — Emergency Department (HOSPITAL_COMMUNITY)
Admission: EM | Admit: 2022-06-04 | Discharge: 2022-06-04 | Discharge status: Against Medical Advice | Payer: Medicare Other | Attending: Emergency Medicine | Admitting: Emergency Medicine

## 2022-06-04 ENCOUNTER — Other Ambulatory Visit: Payer: Self-pay

## 2022-06-04 DIAGNOSIS — I10 Essential (primary) hypertension: Secondary | ICD-10-CM | POA: Diagnosis not present

## 2022-06-04 DIAGNOSIS — Z79899 Other long term (current) drug therapy: Secondary | ICD-10-CM | POA: Diagnosis not present

## 2022-06-04 DIAGNOSIS — Z5321 Procedure and treatment not carried out due to patient leaving prior to being seen by health care provider: Secondary | ICD-10-CM | POA: Insufficient documentation

## 2022-06-04 DIAGNOSIS — R0789 Other chest pain: Secondary | ICD-10-CM | POA: Diagnosis not present

## 2022-06-04 DIAGNOSIS — R42 Dizziness and giddiness: Secondary | ICD-10-CM | POA: Diagnosis not present

## 2022-06-04 DIAGNOSIS — T447X5A Adverse effect of beta-adrenoreceptor antagonists, initial encounter: Secondary | ICD-10-CM | POA: Insufficient documentation

## 2022-06-04 NOTE — ED Triage Notes (Signed)
Patient saw a cardiologist last week for his "heart fluttering". Patient states he felt his heart fluttering today and took a Propranolol 3 hours ago and states he had dizziness after taking.

## 2022-06-04 NOTE — ED Provider Triage Note (Signed)
Emergency Medicine Provider Triage Evaluation Note  Joshua [name] , a 61 y.o. male  was evaluated in triage.  Pt complains of dizziness.  States he has been recently started on propanolol for hypertension.  States he started to have dizziness after taking his antiseizure medications and propranolol 2 hours ago.  Denies any weakness, tingling or numbness.  Denies any chest pain, shortness of breath, fever, nausea, vomiting, bowel changes, urinary symptoms. Review of Systems  Positive: As above Negative: As above  Physical Exam  BP (!) 129/93 (BP Location: Right Arm)   Pulse 84   Temp 99.1 F (37.3 C) (Oral)   Resp 18   Ht '5\' 9"'$  (1.753 m)   Wt 79.4 kg   SpO2 96%   BMI 25.84 kg/m  Gen:   Awake, no distress   Resp:  Normal effort  MSK:   Moves extremities without difficulty  Other:  Neuro exam unremarkable  Medical Decision Making  Medically screening exam initiated at 7:13 PM.  Appropriate orders placed.  TYREL LEX was informed that the remainder of the evaluation will be completed by another provider, this initial triage assessment does not replace that evaluation, and the importance of remaining in the ED until their evaluation is complete.     Rex Kras, [place] 06/04/22 2245

## 2022-06-04 NOTE — ED Notes (Signed)
Pt called x3 without response. °

## 2022-06-05 DIAGNOSIS — M25512 Pain in left shoulder: Secondary | ICD-10-CM | POA: Diagnosis not present

## 2022-06-05 DIAGNOSIS — Z043 Encounter for examination and observation following other accident: Secondary | ICD-10-CM | POA: Diagnosis not present

## 2022-06-05 DIAGNOSIS — R0781 Pleurodynia: Secondary | ICD-10-CM | POA: Diagnosis not present

## 2022-06-05 DIAGNOSIS — W108XXA Fall (on) (from) other stairs and steps, initial encounter: Secondary | ICD-10-CM | POA: Diagnosis not present

## 2022-06-06 ENCOUNTER — Other Ambulatory Visit: Payer: Self-pay | Admitting: *Deleted

## 2022-06-06 ENCOUNTER — Telehealth: Payer: Self-pay | Admitting: *Deleted

## 2022-06-06 DIAGNOSIS — D320 Benign neoplasm of cerebral meninges: Secondary | ICD-10-CM

## 2022-06-06 NOTE — Telephone Encounter (Signed)
Patient called to report some changes with vision and feeling off balance recently.  He is due for next MRI 09/16/2022.  He is asking if he should keep that expected date or should he have it moved up.  He plans to see his PCP about this issue.  Unsure if he needs to change anything on our end.

## 2022-06-08 ENCOUNTER — Emergency Department (HOSPITAL_COMMUNITY)
Admission: EM | Admit: 2022-06-08 | Discharge: 2022-06-08 | Disposition: A | Discharge status: Home / Self Care | Payer: Medicare Other

## 2022-06-08 ENCOUNTER — Emergency Department: Payer: Medicare Other

## 2022-06-08 ENCOUNTER — Emergency Department
Admission: EM | Admit: 2022-06-08 | Discharge: 2022-06-09 | Disposition: A | Discharge status: Home / Self Care | Payer: Medicare Other | Attending: Student in an Organized Health Care Education/Training Program | Admitting: Student in an Organized Health Care Education/Training Program

## 2022-06-08 ENCOUNTER — Encounter: Payer: Self-pay | Admitting: *Deleted

## 2022-06-08 ENCOUNTER — Other Ambulatory Visit: Payer: Self-pay

## 2022-06-08 DIAGNOSIS — R200 Anesthesia of skin: Secondary | ICD-10-CM | POA: Insufficient documentation

## 2022-06-08 DIAGNOSIS — R202 Paresthesia of skin: Secondary | ICD-10-CM | POA: Diagnosis not present

## 2022-06-08 DIAGNOSIS — R0789 Other chest pain: Secondary | ICD-10-CM | POA: Diagnosis not present

## 2022-06-08 DIAGNOSIS — R29818 Other symptoms and signs involving the nervous system: Secondary | ICD-10-CM | POA: Diagnosis not present

## 2022-06-08 LAB — CBC WITH DIFFERENTIAL/PLATELET
Abs Immature Granulocytes: 0.01 10*3/uL (ref 0.00–0.07)
Basophils Absolute: 0.1 10*3/uL (ref 0.0–0.1)
Basophils Relative: 1 %
Eosinophils Absolute: 0.1 10*3/uL (ref 0.0–0.5)
Eosinophils Relative: 2 %
HCT: 45.2 % (ref 39.0–52.0)
Hemoglobin: 15.5 g/dL (ref 13.0–17.0)
Immature Granulocytes: 0 %
Lymphocytes Relative: 27 %
Lymphs Abs: 1.8 10*3/uL (ref 0.7–4.0)
MCH: 30.9 pg (ref 26.0–34.0)
MCHC: 34.3 g/dL (ref 30.0–36.0)
MCV: 90.2 fL (ref 80.0–100.0)
Monocytes Absolute: 0.7 10*3/uL (ref 0.1–1.0)
Monocytes Relative: 11 %
Neutro Abs: 4 10*3/uL (ref 1.7–7.7)
Neutrophils Relative %: 59 %
Platelets: 271 10*3/uL (ref 150–400)
RBC: 5.01 MIL/uL (ref 4.22–5.81)
RDW: 12.8 % (ref 11.5–15.5)
WBC: 6.7 10*3/uL (ref 4.0–10.5)
nRBC: 0 % (ref 0.0–0.2)

## 2022-06-08 LAB — COMPREHENSIVE METABOLIC PANEL
ALT: 20 U/L (ref 0–44)
AST: 19 U/L (ref 15–41)
Albumin: 4.5 g/dL (ref 3.5–5.0)
Alkaline Phosphatase: 67 U/L (ref 38–126)
Anion gap: 7 (ref 5–15)
BUN: 16 mg/dL (ref 8–23)
CO2: 25 mmol/L (ref 22–32)
Calcium: 9.2 mg/dL (ref 8.9–10.3)
Chloride: 109 mmol/L (ref 98–111)
Creatinine, Ser: 0.86 mg/dL (ref 0.61–1.24)
GFR, Estimated: >60 mL/min (ref >60)
Glucose, Bld: 91 mg/dL (ref 70–99)
Potassium: 4.5 mmol/L (ref 3.5–5.1)
Sodium: 141 mmol/L (ref 135–145)
Total Bilirubin: 0.6 mg/dL (ref 0.3–1.2)
Total Protein: 7.6 g/dL (ref 6.5–8.1)

## 2022-06-08 LAB — TROPONIN I (HIGH SENSITIVITY)
Troponin I (High Sensitivity): 7 ng/L (ref <18)
Troponin I (High Sensitivity): 7 ng/L (ref <18)

## 2022-06-08 LAB — CBG MONITORING, ED: Glucose-Capillary: 98 mg/dL (ref 70–99)

## 2022-06-08 MED ORDER — GADOBUTROL 1 MMOL/ML IV SOLN
7.5000 mL | Freq: Once | INTRAVENOUS | Status: AC | PRN
  Administered 2022-06-09: 7.5 mL via INTRAVENOUS

## 2022-06-08 MED ORDER — LORAZEPAM 1 MG PO TABS
1.0000 mg | ORAL_TABLET | Freq: Once | ORAL | Status: AC
  Administered 2022-06-08: 1 mg via ORAL
  Filled 2022-06-08: qty 1

## 2022-06-08 NOTE — ED Notes (Signed)
Pt leaving for imaging.

## 2022-06-08 NOTE — ED Provider Notes (Signed)
[facility] Provider Note    Event Date/Time   First MD Initiated Contact with Patient 06/08/22 1913     (approximate)   History   Numbness   HPI  Joshua [name] is a 61 y.o. male with history of meningioma presents to the ER for evaluation of 1 week of progressively worsening weakness and tingling in his left upper extremity.  Had similar symptoms that led to her diagnosis of previous meningioma.  States he has outpatient MRI scheduled this coming week but following symptoms were getting worse so presented to the ER.  He denies any chest pain or pressure no shortness of breath.  No fevers.  Denies any falls or injury.     Physical Exam   Triage Vital Signs: ED Triage Vitals  Enc Vitals Group     BP 06/08/22 1605 139/74     Pulse Rate 06/08/22 1605 78     Resp 06/08/22 1605 18     Temp 06/08/22 1605 98.2 F (36.8 C)     Temp Source 06/08/22 1605 Oral     SpO2 06/08/22 1605 96 %     Weight 06/08/22 1602 175 lb (79.4 kg)     Height 06/08/22 1602 '5\' 9"'$  (1.753 m)     Head Circumference --      Peak Flow --      Pain Score 06/08/22 1602 9     Pain Loc --      Pain Edu? --      Excl. in [place]? --     Most recent vital signs: Vitals:   06/08/22 1944 06/08/22 2234  BP: (!) 128/91 120/79  Pulse: 65 69  Resp: 18 18  Temp:  98.1 F (36.7 C)  SpO2: 96% 96%     Constitutional: Alert  Eyes: Conjunctivae are normal.  Head: Atraumatic. Nose: No congestion/rhinnorhea. Mouth/Throat: Mucous membranes are moist.   Neck: Painless ROM.  Cardiovascular:   Good peripheral circulation. Respiratory: Normal respiratory effort.  No retractions.  Gastrointestinal: Soft and nontender.  Musculoskeletal:  no deformity Neurologic:  MAE spontaneously. No gross focal neurologic deficits are appreciated.  Skin:  Skin is warm, dry and intact. No rash noted. Psychiatric: Mood and affect are normal. Speech and behavior are normal.    ED Results / Procedures /  Treatments   Labs (all labs ordered are listed, but only abnormal results are displayed) Labs Reviewed  CBC WITH DIFFERENTIAL/PLATELET  COMPREHENSIVE METABOLIC PANEL  CBG MONITORING, ED  TROPONIN I (HIGH SENSITIVITY)  TROPONIN I (HIGH SENSITIVITY)  TROPONIN I (HIGH SENSITIVITY)     EKG  ED ECG REPORT I, Merlyn Lot, the attending physician, personally viewed and interpreted this ECG.   Date: 06/08/2022  EKG Time: 19:39  Rate: 60  Rhythm: sinus  Axis: normal  Intervals: normal qt  ST&T Change: no stemi, no depressions,     RADIOLOGY Please see ED Course for my review and interpretation.  I personally reviewed all radiographic images ordered to evaluate for the above acute complaints and reviewed radiology reports and findings.  These findings were personally discussed with the patient.  Please see medical record for radiology report.    PROCEDURES:  Critical Care performed:   Procedures   MEDICATIONS ORDERED IN ED: Medications  gadobutrol (GADAVIST) 1 MMOL/ML injection 7.5 mL (has no administration in time range)  LORazepam (ATIVAN) tablet 1 mg (1 mg Oral Given 06/08/22 2235)     IMPRESSION / MDM / ASSESSMENT AND PLAN /  ED COURSE  I reviewed the triage vital signs and the nursing notes.                              Differential diagnosis includes, but is not limited to, radiculopathy, cva, mass, acs, msk strain  Patient presenting to the ER for evaluation of symptoms as described above.  Based on symptoms, risk factors and considered above differential, this presenting complaint could reflect a potentially life-threatening illness therefore the patient will be placed on continuous pulse oximetry and telemetry for monitoring.  Laboratory evaluation will be sent to evaluate for the above complaints.   CT imaging on my review and interpretation does not show any evidence of SDH or IPH.  Given his history of mass with presenting symptoms we will order  MRI. Clinical Course as of 06/08/22 2348  Wed Jun 08, 2022  2327 Patient will be signed out to oncoming physician pending follow-up MRI. [PR]    Clinical Course User Index [PR] Merlyn Lot, MD     FINAL CLINICAL IMPRESSION(S) / ED DIAGNOSES   Final diagnoses:  Paresthesia     Rx / DC Orders   ED Discharge Orders     None        Note:  This document was prepared using Dragon voice recognition software and may include unintentional dictation errors.    Merlyn Lot, MD 06/08/22 2348

## 2022-06-08 NOTE — ED Triage Notes (Addendum)
Pt has numbness in left arm for 1 week.  No chest pain.  Pt has a headache for 2 days.  Pt has nausea. No slurred speech.  Pt ambulates with a cane due to brain meningiomas x 2 10 years ago.  Surgery done at [facility].  Pt alert  speech clear

## 2022-06-08 NOTE — ED Notes (Signed)
Patient transported to MRI 

## 2022-06-08 NOTE — ED Provider Triage Note (Signed)
  Emergency Medicine Provider Triage Evaluation Note  Joshua [name] , a 61 y.o.male,  was evaluated in triage.  Pt complains of left arm numbness.  He states that this been going on for the past 3 days.  He states that this feels very similar with the last time that they found meningiomas in his brain, which were previously removed.  He states that he feels funny and feels like he is about to have a seizure.  Denies any other symptoms.   Review of Systems  Positive: Left arm numbness Negative: Denies fever, chest pain, vomiting  Physical Exam  There were no vitals filed for this visit. Gen:   Awake, no distress   Resp:  Normal effort  MSK:   Moves extremities without difficulty  Other:  No facial droop, normal sensation and motor function in all extremities.  Medical Decision Making  Given the patient's initial medical screening exam, the following diagnostic evaluation has been ordered. The patient will be placed in the appropriate treatment space, once one is available, to complete the evaluation and treatment. I have discussed the plan of care with the patient and I have advised the patient that an ED physician or mid-level practitioner will reevaluate their condition after the test results have been received, as the results may give them additional insight into the type of treatment they may need.    Diagnostics: Head CT, labs.  Treatments: none immediately   Teodoro Spray, [place] 06/08/22 1538

## 2022-06-09 DIAGNOSIS — R200 Anesthesia of skin: Secondary | ICD-10-CM | POA: Diagnosis not present

## 2022-06-09 NOTE — ED Provider Notes (Signed)
Patient's MRI of the brain does not show any new mass lesions or strokes.  There is some foraminal narrowing at several spots in his cervical spine with mild to moderate canal stenosis.  I will have patient follow-up with neurosurgery he will return if she is worse. Offi1. C4-C5 moderate spinal canal stenosis and severe bilateral neural foraminal narrowing. 2. C3-C4 moderate spinal canal stenosis with severe left and moderate right neural foraminal narrowing. 3. C6-C7 mild spinal canal stenosis with severe left and moderate to severe right neural foraminal narrowing. 4. C5-C6 moderate left and mild right neural foraminal narrowing.cial report of findings as below  I have reviewed the MRI of the head and neck the radiologist read the films.   Nena Polio, MD 06/09/22 [id number]

## 2022-06-09 NOTE — Discharge Instructions (Addendum)
The MRI of the brain does not show any problems.  You do have some narrowing which may be causing a pinched nerve/nerve impingement at several spots in the neck that may be causing the left arm numbness.  It does not look like there is anything we need to do tonight but please call the neurosurgeon, Dr. Meade Maw.  And schedule a follow-up appointment.  He will be able to address any nerve impingement that could be causing the symptoms.  Please return if you have any weakness or markedly worse numbness.  There may be office hours on Friday and should definitely be office hours by Monday.

## 2022-06-12 ENCOUNTER — Ambulatory Visit (HOSPITAL_COMMUNITY): Payer: Medicare Other

## 2022-06-14 ENCOUNTER — Encounter (INDEPENDENT_AMBULATORY_CARE_PROVIDER_SITE_OTHER): Payer: Self-pay | Admitting: Vascular Surgery

## 2022-06-14 ENCOUNTER — Ambulatory Visit (INDEPENDENT_AMBULATORY_CARE_PROVIDER_SITE_OTHER): Payer: Medicare Other | Admitting: Vascular Surgery

## 2022-06-14 VITALS — BP 122/74 | HR 52 | Resp 18 | Ht 69.0 in | Wt 189.6 lb

## 2022-06-14 DIAGNOSIS — C700 Malignant neoplasm of cerebral meninges: Secondary | ICD-10-CM

## 2022-06-14 DIAGNOSIS — I10 Essential (primary) hypertension: Secondary | ICD-10-CM

## 2022-06-14 DIAGNOSIS — M79609 Pain in unspecified limb: Secondary | ICD-10-CM | POA: Insufficient documentation

## 2022-06-14 DIAGNOSIS — R7303 Prediabetes: Secondary | ICD-10-CM | POA: Diagnosis not present

## 2022-06-14 DIAGNOSIS — M79605 Pain in left leg: Secondary | ICD-10-CM | POA: Diagnosis not present

## 2022-06-14 NOTE — Patient Instructions (Signed)
Peripheral Vascular Disease  Peripheral vascular disease (PVD) is a disease of the blood vessels. PVD may also be called peripheral artery disease (PAD) or poor circulation. PVD is the blocking or hardening of the arteries anywhere within the circulatory system beyond the heart. This can result in a decreased supply of blood to the arms, legs, and internal organs, such as the stomach or kidneys. However, PVD most often affects a person's lower legs and feet. Without treatment, PVD often worsens. PVD can lead to acute limb ischemia. This occurs when an arm or leg suddenly has trouble getting enough blood. This is a medical emergency. What are the causes? The most common cause of PVD is atherosclerosis. This is a buildup of fatty material and other substances (plaque)inside your arteries. Pieces of plaque can break off from the walls of an artery and become stuck in a smaller artery, blocking blood flow and possibly causing acute limb ischemia. Other common causes of PVD include: Blood clots that form inside the blood vessels. Injuries to blood vessels. Diseases that cause inflammation of blood vessels or cause blood vessel tightening (spasms). What increases the risk? The following factors may make you more likely to develop this condition: A family history of PVD. Common medical conditions, including: High cholesterol. Diabetes. High blood pressure (hypertension). Heart disease. Known atherosclerotic disease in another area of the body. Past injury, such as burns or a broken bone. Other medical conditions, such as: Buerger's disease. This is caused by inflamed blood vessels in your hands and feet. Some forms of arthritis. Birth defects that affect the arteries in your legs. Kidney disease. Using tobacco and nicotine products. Not getting enough exercise. Obesity. Being age 65 or older, or being age 50 or older and having the other risk factors. What are the signs or symptoms? This  condition may cause different symptoms. Your symptoms depend on what body part is not getting enough blood. Common signs and symptoms include: Cramps in your buttocks, legs, and feet. Intermittent claudication. This is pain and weakness in your legs during activity that resolves with rest. Leg pain at rest and leg numbness, tingling, or weakness. Coldness in a leg or foot, especially when compared to the other leg or foot. Skin or hair changes. These can include: Hair loss. Shiny skin. Pale or bluish skin. Thick toenails. Inability to get or maintain an erection (erectile dysfunction). Tiredness (fatigue). Weak pulse or no pulse in the feet. People with PVD are more likely to develop open wounds (ulcers) and sores on their toes, feet, or legs. The ulcers or sores may take longer than normal to heal. How is this diagnosed? PVD is diagnosed based on your signs and symptoms, a physical exam, and your medical history. You may also have other tests to find the cause. Tests include: Ankle-brachial index test.This test compares the blood pressure readings of the legs and arms. This may also include an exercise ankle-brachial index test in which you walk on a treadmill to check your symptoms. Doppler ultrasound. This takes pictures of blood flow through your blood vessels. Imaging studies that use dye to show blood flow. These are: CT angiogram. Magnetic resonance angiogram, or MRA. How is this treated? Treatment for PVD depends on the cause of your condition, how severe your symptoms are, and your age. Underlying causes need to be treated and controlled. These include long-term (chronic) conditions, such as diabetes, high cholesterol, and hypertension. Treatment may include: Lifestyle changes, such as: Quitting tobacco use. Exercising regularly. Following a   low-fat, low-cholesterol diet. Not drinking alcohol. Taking medicines, such as: Blood thinners to prevent blood clots. Medicines to  improve blood flow. Medicines to improve cholesterol levels. Procedures, such as: Angioplasty. This uses an inflated balloon to open a blocked artery and improve blood flow. Stent implant. This inserts a small mesh tube to keep a blocked artery open. Peripheral bypass surgery. This reroutes blood flow around a blocked artery. Surgery to remove dead tissue from an infected wound (debridement). Amputation. This is surgical removal of the affected limb. It may be necessary in cases of acute limb ischemia when medical or surgical treatments have not helped. Follow these instructions at home: Medicines Take over-the-counter and prescription medicines only as told by your health care provider. If you are taking blood thinners: Talk with your health care provider before you take any medicines that contain aspirin or NSAIDs, such as ibuprofen. These medicines increase your risk for dangerous bleeding. Take your medicine exactly as told, at the same time every day. Avoid activities that could cause injury or bruising, and follow instructions about how to prevent falls. Wear a medical alert bracelet or carry a card that lists what medicines you take. Lifestyle     Exercise regularly. Ask your health care provider about some good activities for you. Talk with your health care provider about maintaining a healthy weight. If needed, ask about losing weight. Eat a diet that is low in fat and cholesterol. If you need help, talk with your health care provider. Do not drink alcohol. Do not use any products that contain nicotine or tobacco. These products include cigarettes, chewing tobacco, and vaping devices, such as e-cigarettes. If you need help quitting, ask your health care provider. General instructions Take good care of your feet. To do this: Wear comfortable shoes that fit well. Check your feet often for any cuts or sores. Get an annual influenza vaccine. Keep all follow-up visits. This is  important. Where to find more information Society for Vascular Surgery: vascular.org American Heart Association: heart.org National Heart, Lung, and Blood Institute: nhlbi.nih.gov Contact a health care provider if: You have leg cramps while walking. You have leg pain when you rest. Your leg or foot feels cold. Your skin changes color. You have erectile dysfunction. You have cuts or sores on your legs or feet that do not heal. Get help right away if: You have sudden changes in color and feeling of your arms or legs, such as: Your arm or leg turns cold, numb, and blue. Your arm or leg becomes red, warm, swollen, painful, or numb. You have any symptoms of a stroke. "BE FAST" is an easy way to remember the main warning signs of a stroke: B - Balance. Signs are dizziness, sudden trouble walking, or loss of balance. E - Eyes. Signs are trouble seeing or a sudden change in vision. F - Face. Signs are sudden weakness or numbness of the face, or the face or eyelid drooping on one side. A - Arms. Signs are weakness or numbness in an arm. This happens suddenly and usually on one side of the body. S - Speech. Signs are sudden trouble speaking, slurred speech, or trouble understanding what people say. T - Time. Time to call emergency services. Write down what time symptoms started. You have other signs of a stroke, such as: A sudden, severe headache with no known cause. Nausea or vomiting. Seizure. You have chest pain or trouble breathing. These symptoms may represent a serious problem that is an emergency.   Do not wait to see if the symptoms will go away. Get medical help right away. Call your local emergency services (911 in the [place] Do not drive yourself to the hospital. Summary Peripheral vascular disease (PVD) is a disease of the blood vessels. PVD is the blocking or hardening of the arteries anywhere within the circulatory system beyond the heart. PVD may cause different symptoms. Your  symptoms depend on what part of your body is not getting enough blood. Treatment for PVD depends on what caused it, how severe your symptoms are, and your age. This information is not intended to replace advice given to you by your health care provider. Make sure you discuss any questions you have with your health care provider. Document Revised: 01/06/2020 Document Reviewed: 01/06/2020 [employer] Patient Education  2023 [employer]  

## 2022-06-14 NOTE — Assessment & Plan Note (Signed)
Recommend:  The patient has atypical pain symptoms for pure atherosclerotic disease. However, on physical exam there is evidence of vascular disease, given the diminished pulses and the edema associated with venous changes of the legs.  Further investigation of the patient's vascular disease is necessary to determine the relationship of the patient's lower extremity symptoms and the degree of vascular disease.  Noninvasive studies of the will be obtained and the patient will follow up with me to review these studies.  I suspect the patient is c/o pseudoclaudication.  Patient should have an evaluation of his LS spine which I defer to either the primary service or the Spine service.  The patient should continue walking and begin a more formal exercise program. The patient should continue his antiplatelet therapy and aggressive treatment of the lipid abnormalities.

## 2022-06-14 NOTE — Progress Notes (Signed)
Patient ID: Joshua [name], male   DOB: 02-10-1961, 61 y.o.   MRN: [id number]  Chief Complaint  Patient presents with   Establish Care    Referred by Dr Magnus Ivan    HPI Joshua [name] is a 61 y.o. male.  I am asked to see the patient by Dr. Magnus Ivan for evaluation of left leg pain.  He describes pain and tightness in that left leg.  It occasionally swells.  The pain starts in the thigh and radiates down the back of the leg into the knee and calf area.  It is not necessarily related to activity.  Certain positions do seem to exacerbate it.  The only thing he can notice that makes it better would be things like water aerobics and exercises.  No previous history of DVT or superficial thrombophlebitis to his knowledge.  No wounds or sores.  He denies any chest pain or shortness of breath.  He has a history of a brain tumor removed almost a decade ago and the pain started after that.     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   Meningioma ([place])    Meningioma, recurrent of brain ([place]) 01/31/2019   Neuropathy 12/05/2019   Pre-diabetes    Rotator cuff tear    right   Seizures ([place])    11/27/19 last sz 1 wk ago    Past Surgical History:  Procedure Laterality Date   APPLICATION OF CRANIAL NAVIGATION N/A 01/10/2019   Procedure: APPLICATION OF CRANIAL NAVIGATION;  Surgeon: Erline Levine, MD;  Location: [facility];  Service: Neurosurgery;  Laterality: N/A;   CATARACT EXTRACTION Right    CRANIOTOMY Right 11/06/2012   Procedure: CRANIOTOMY TUMOR EXCISION;  Surgeon: Erline Levine, MD;  Location: [facility];  Service: Neurosurgery;  Laterality: Right;   Right Parasagittal craniotomy for meningioma with [facility]   CRANIOTOMY Right 01/10/2019   Procedure: Right Parasagittal Craniotomy for Tumor;  Surgeon: Erline Levine, MD;  Location: [facility];  Service: Neurosurgery;  Laterality: Right;  Right parasagittal craniotomy for tumor   ESOPHAGOGASTRODUODENOSCOPY     JOINT REPLACEMENT Right 2018   right hip   REVERSE SHOULDER ARTHROPLASTY Right 01/08/2021   Procedure: RIGHT REVERSE SHOULDER ARTHROPLASTY;  Surgeon: Meredith Pel, MD;  Location: [facility];  Service: Orthopedics;  Laterality: Right;   right knee arthroscopy     SHOULDER ARTHROSCOPY Right 06/09/2020   Procedure: RIGHT SHOULDER ARTHROSCOPY AND DEBRIDEMENT;  Surgeon: Newt Minion, MD;  Location: [facility];  Service: Orthopedics;  Laterality: Right;     Family History  Problem Relation Age of Onset   Hypertension Mother    Dementia Mother    Diabetes Mother    Hypertension Father    CAD Father    Diabetes Father    CAD Brother       Social History   Tobacco Use   Smoking status: Former    Types: Cigarettes    Quit date: 01/29/2019    Years since quitting: 3.3    Passive exposure: Past   Smokeless tobacco: Never   Tobacco comments:  weekend smoker  Vaping Use   Vaping Use: Some days   Substances: Nicotine, Flavoring  Substance Use Topics   Alcohol use: Not Currently    Comment: social   Drug use: No     No Known Allergies  Current Outpatient Medications  Medication Sig Dispense Refill   Accu-Chek Softclix Lancets lancets Use as instructed 100 each 12   acetaminophen (TYLENOL) 500 MG tablet Take 500 mg by mouth every 6 (six) hours as needed for moderate pain or headache.     albuterol (VENTOLIN HFA) 108 (90 Base) MCG/ACT inhaler Inhale 2 puffs into the lungs every 6 (six) hours as needed for wheezing or shortness of breath. 8 g 2   amLODipine (NORVASC) 5 MG tablet TAKE 1 TABLET (5 MG TOTAL) BY MOUTH DAILY. TO LOWER BLOOD PRESSURE. STOP LOSARTAN  90 tablet 0   aspirin 81 MG chewable tablet Chew 81 mg by mouth daily.     atorvastatin (LIPITOR) 20 MG tablet TAKE 1 TABLET (20 MG TOTAL) BY MOUTH DAILY. 30 tablet 0   Blood Glucose Monitoring Suppl (ACCU-CHEK GUIDE ME) w/Device KIT Use to check blood sugar once daily. 1 kit 0   clonazePAM (KLONOPIN) 0.5 MG tablet Take 1 tablet (0.5 mg total) by mouth at bedtime. 60 tablet 2   diazepam (VALIUM) 2 MG tablet Take 1 tablet (2 mg total) by mouth 2 (two) times daily. 8 tablet 0   glucose blood (ACCU-CHEK GUIDE) test strip Use to check blood sugar once daily. 100 each 6   ketoconazole (NIZORAL) 2 % cream APPLY 1 APPLICATION. TOPICALLY DAILY. 60 g 2   Lacosamide 100 MG TABS TAKE 1 TABLET (100 MG TOTAL) BY MOUTH IN THE MORNING AND AT BEDTIME. 60 tablet 3   meclizine (ANTIVERT) 25 MG tablet Take 1 tablet (25 mg total) by mouth 3 (three) times daily as needed for dizziness. 30 tablet 0   metFORMIN (GLUCOPHAGE) 500 MG tablet Take 1 tablet (500 mg total) by mouth 2 (two) times daily. 180 tablet 1   methocarbamol (ROBAXIN) 500 MG tablet Take 1 tablet (500 mg total) by mouth 2 (two) times daily. 20 tablet 0   naproxen (NAPROSYN) 375 MG tablet Take 1 tablet (375 mg total) by mouth 2 (two) times daily. 20 tablet 0   propranolol (INDERAL) 10 MG tablet Take 1 tablet (10 mg total) by mouth 3 (three) times daily as needed (palpitations). 60 tablet 11   tadalafil (CIALIS) 5 MG tablet Take 1 tablet (5 mg total) by mouth daily as needed for erectile dysfunction. 1 hour prior to sexual activity 30 tablet 11   tamsulosin (FLOMAX) 0.4 MG CAPS capsule Take 1 capsule (0.4 mg total) by mouth daily. 90 capsule 3   VITAMIN A PO Take 1 capsule by mouth daily.     lidocaine (LIDODERM) 5 % Place 1 patch onto the skin daily. Remove & Discard patch within 12 hours or as directed by MD (Patient not taking: Reported on 06/14/2022) 14 patch 0   No current facility-administered medications for this visit.      REVIEW OF SYSTEMS  (Negative unless checked)  Constitutional: _0 Weight loss  _1 Fever  _2 Chills Cardiac: _3 Chest pain   _4 Chest pressure   _5 Palpitations   _6 Shortness of breath when laying flat   _7 Shortness of breath at rest   _8 Shortness of breath with exertion. Vascular:  _9 Pain in legs with walking   _10 Pain in legs at rest   _11 Pain in legs when laying flat   _12 Claudication   _13   Pain in feet when walking  _0 Pain in feet at rest  _1 Pain in feet when laying flat   _2 History of DVT   _3 Phlebitis   _4 Swelling in legs   _5 Varicose veins   _6 Non-healing ulcers Pulmonary:   _7 Uses home oxygen   _8 Productive cough   _9 Hemoptysis   _10 Wheeze  _11 COPD   _12 Asthma Neurologic:  _13 Dizziness  _14 Blackouts   _15 Seizures   _16 History of stroke   _17 History of TIA  _18 Aphasia   _19 Temporary blindness   _20 Dysphagia   _21 Weakness or numbness in arms   _22 Weakness or numbness in legs Musculoskeletal:  _23 Arthritis   _24 Joint swelling   _25 Joint pain   _26 Low back pain Hematologic:  _27 Easy bruising  _28 Easy bleeding   _29 Hypercoagulable state   _30 Anemic  _31 Hepatitis Gastrointestinal:  _32 Blood in stool   _33 Vomiting blood  _34 Gastroesophageal reflux/heartburn   _35 Abdominal pain Genitourinary:  _36 Chronic kidney disease   _37 Difficult urination  _38 Frequent urination  _39 Burning with urination   _40 Hematuria Skin:  _41 Rashes   _42 Ulcers   _43 Wounds Psychological:  _44 History of anxiety   _45  History of major depression.    Physical Exam BP 122/74 (BP Location: Right Arm)   Pulse (!) 52   Resp 18   Ht _46  (1.753 m)   Wt 189 lb 9.6 oz (86 kg)   BMI 28.00 kg/m  Gen:  WD/WN, NAD. Appears older than stated age. Head: [place]/AT, No temporalis wasting.  Ear/Nose/Throat: Hearing grossly intact, nares w/o erythema or drainage, oropharynx w/o Erythema/Exudate Eyes: Conjunctiva clear, sclera non-icteric  Neck: trachea midline.  No JVD.  Pulmonary:  Good air movement, respirations not labored, no use of accessory muscles  Cardiac: bradycardic Vascular:  Vessel  Right Left  Radial Palpable Palpable                          DP 2+ 1+  PT 1+ 1+   Gastrointestinal:. No masses, surgical incisions, or scars. Musculoskeletal: M/S 5/5 throughout.  Extremities without ischemic changes.  No deformity or atrophy. Trace LLE edema. Scattered varicosities LLE up to 2 mm in size Neurologic: Sensation grossly intact in extremities.  Symmetrical.  Speech is fluent. Motor exam as listed above. Psychiatric: Judgment intact, Mood & affect appropriate for pt's clinical situation. Dermatologic: No rashes or ulcers noted.  No cellulitis or open wounds.    Radiology MR Cervical Spine W or Wo Contrast  Result Date: 06/09/2022 CLINICAL DATA:  Left arm numbness for the past 3 days EXAM: MRI CERVICAL SPINE WITHOUT AND WITH CONTRAST TECHNIQUE: Multiplanar and multiecho pulse sequences of the cervical spine, to include the craniocervical junction and cervicothoracic junction, were obtained without and with intravenous contrast. CONTRAST:  7.63m GADAVIST GADOBUTROL 1 MMOL/ML IV SOLN COMPARISON:  No prior MRI, correlation is made with CT cervical spine 04/07/2022 FINDINGS: Alignment: Trace retrolisthesis of C3 on C4 and C4 on C5, unchanged. Vertebrae: No acute fracture or suspicious osseous lesion. No abnormal enhancement. Cord: Normal signal and morphology.  No abnormal enhancement. Posterior Fossa, vertebral arteries, paraspinal tissues: Negative. Disc levels: C2-C3: No significant disc bulge. Mild facet arthropathy. No spinal canal stenosis or neural foraminal narrowing. C3-C4: Minimal disc bulge. Left-greater-than-right uncovertebral and facet arthropathy. Ligamentum flavum hypertrophy. Moderate spinal canal stenosis. Moderate right and severe left neural foraminal narrowing. C4-C5: Mild disc bulge. Facet and uncovertebral hypertrophy. Moderate spinal canal stenosis. Severe bilateral neural foraminal narrowing. C5-C6: Disc bulge with left subarticular protrusion. Facet  arthropathy. No spinal canal stenosis. Mild right  and moderate left neural foraminal narrowing. C6-C7: Mild disc bulge. Facet and uncovertebral hypertrophy. Mild spinal canal stenosis. Severe left and moderate to severe right neural foraminal narrowing. C7-T1: No significant disc bulge. No spinal canal stenosis or neuroforaminal narrowing. IMPRESSION: 1. C4-C5 moderate spinal canal stenosis and severe bilateral neural foraminal narrowing. 2. C3-C4 moderate spinal canal stenosis with severe left and moderate right neural foraminal narrowing. 3. C6-C7 mild spinal canal stenosis with severe left and moderate to severe right neural foraminal narrowing. 4. C5-C6 moderate left and mild right neural foraminal narrowing. Electronically Signed   By: Merilyn Baba M.D.   On: 06/09/2022 00:48   MR Brain W and Wo Contrast  Result Date: 06/09/2022 CLINICAL DATA:  Left arm numbness, states that this feels very similar to the last time the found meningiomas in brain EXAM: MRI HEAD WITHOUT AND WITH CONTRAST TECHNIQUE: Multiplanar, multiecho pulse sequences of the brain and surrounding structures were obtained without and with intravenous contrast. CONTRAST:  7.23m GADAVIST GADOBUTROL 1 MMOL/ML IV SOLN COMPARISON:  09/14/2021 MRI head without contrast; 03/26/2021 MRI head with and without contrast FINDINGS: Evaluation is somewhat limited by motion artifact. Brain: Postsurgical changes in the right frontal parietal and left parietal region, with unchanged dural thickening and enhancement seen on 03/26/2021. 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 Adjacent T2 hyperintense signal in the right frontoparietal and left parietal regions is unchanged. No abnormal parenchymal enhancement. No new meningeal enhancement. No restricted diffusion to suggest acute or subacute infarct.  No acute hemorrhage, mass, mass effect, or midline shift. No hydrocephalus or extra-axial collection. Vascular: Normal arterial flow voids. Normal arterial enhancement. 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 Skull and upper cervical spine: Prior biparietal craniotomy. Otherwise normal marrow signal. Sinuses/Orbits: Clear paranasal sinuses. Status post bilateral lens replacements. Other: Trace fluid in the left mastoid tip. IMPRESSION: 1. Evaluation is somewhat limited by motion artifact. 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 2. No evidence of acute or subacute infarct. Electronically Signed   By: AMerilyn BabaM.D.   On: 06/09/2022 00:42   CT Head Wo Contrast  Result Date: 06/08/2022 CLINICAL DATA:  Neuro deficit, acute, stroke suspected neuro deficit; left arm numbness EXAM: CT HEAD WITHOUT CONTRAST TECHNIQUE: Contiguous axial images were obtained from the base of the skull through the vertex without intravenous contrast. RADIATION DOSE REDUCTION: This exam was performed according to the departmental dose-optimization program which includes automated exposure control, adjustment of the mA and/or kV according to patient size and/or use of iterative reconstruction technique. COMPARISON:  CT head 04/07/2022. FINDINGS: Brain: No evidence of acute infarction, hemorrhage, hydrocephalus, extra-axial collection or mass lesion/mass effect. Similar mild cephalo malacia along the right greater than left frontal convexities at the vertex, related to prior meningioma resection. Vascular: No hyperdense vessel. Skull: Craniotomy near the vertex.  No acute fracture. Sinuses/Orbits: Clear sinuses.  No acute orbital findings. Other: No mastoid effusions. IMPRESSION: No evidence of acute intracranial abnormality. Electronically Signed   By:  FMargaretha SheffieldM.D.   On: 06/08/2022 16:02   DG Chest 2 View  Result Date: 05/19/2022 CLINICAL DATA:  Chest pain EXAM: CHEST - 2 VIEW COMPARISON:  Prior chest x-ray 11/30/2021 FINDINGS: The lungs are clear and negative for focal airspace consolidation, pulmonary edema  or suspicious pulmonary nodule. No pleural effusion or pneumothorax. Cardiac and mediastinal contours are within normal limits. No acute fracture or lytic or blastic osseous lesions. The visualized upper abdominal bowel gas pattern is unremarkable. Surgical changes of prior right shoulder arthroplasty. IMPRESSION: No active cardiopulmonary disease. Electronically Signed   By: Jacqulynn Cadet M.D.   On: 05/19/2022 08:02    Labs Recent Results (from the past 2160 hour(s))  CBC with Differential     Status: None   Collection Time: 04/07/22  5:51 PM  Result Value Ref Range   WBC 6.5 4.0 - 10.5 K/uL   RBC 5.12 4.22 - 5.81 MIL/uL   Hemoglobin 15.9 13.0 - 17.0 g/dL   HCT 48.1 39.0 - 52.0 %   MCV 93.9 80.0 - 100.0 fL   MCH 31.1 26.0 - 34.0 pg   MCHC 33.1 30.0 - 36.0 g/dL   RDW 12.9 11.5 - 15.5 %   Platelets 222 150 - 400 K/uL   nRBC 0.0 0.0 - 0.2 %   Neutrophils Relative % 69 %   Neutro Abs 4.5 1.7 - 7.7 K/uL   Lymphocytes Relative 18 %   Lymphs Abs 1.1 0.7 - 4.0 K/uL   Monocytes Relative 11 %   Monocytes Absolute 0.7 0.1 - 1.0 K/uL   Eosinophils Relative 1 %   Eosinophils Absolute 0.1 0.0 - 0.5 K/uL   Basophils Relative 1 %   Basophils Absolute 0.0 0.0 - 0.1 K/uL   Immature Granulocytes 0 %   Abs Immature Granulocytes 0.01 0.00 - 0.07 K/uL    Comment: Performed at [facility], [place] [address] [place], [place] [address]  Comprehensive metabolic panel     Status: Abnormal   Collection Time: 04/07/22  5:51 PM  Result Value Ref Range   Sodium 143 135 - 145 mmol/L   Potassium 4.2 3.5 - 5.1 mmol/L   Chloride 108 98 - 111 mmol/L   CO2 27 22 - 32 mmol/L   Glucose, Bld 122 (H) 70 - 99 mg/dL    Comment:  Glucose reference range applies only to samples taken after fasting for at least 8 hours.   BUN 17 8 - 23 mg/dL   Creatinine, Ser 1.11 0.61 - 1.24 mg/dL   Calcium 9.5 8.9 - 10.3 mg/dL   Total Protein 7.5 6.5 - 8.1 g/dL   Albumin 4.4 3.5 - 5.0 g/dL   AST 20 15 - 41 U/L   ALT 22 0 - 44 U/L   Alkaline Phosphatase 66 38 - 126 U/L   Total Bilirubin 0.5 0.3 - 1.2 mg/dL   GFR, Estimated >60 >60 mL/min    Comment: (NOTE) Calculated using the CKD-EPI Creatinine Equation (2021)    Anion gap 8 5 - 15    Comment: Performed at [facility], [place] [address] [place], [place] [address]  Surgical pathology     Status: None   Collection Time: 04/13/22  9:43 AM  Result Value Ref Range   SURGICAL PATHOLOGY      SURGICAL PATHOLOGY CASE: [id number] PATIENT: Aristeo Hedtke Surgical Pathology Report     Specimen Submitted: A. PROSTATE, LEFT BASE B. PROSTATE, LEFT MID C. PROSTATE, LEFT APEX D. PROSTATE, RIGHT BASE E. PROSTATE, RIGHT MID F. PROSTATE, RIGHT APEX G. PROSTATE, LEFT LATERAL BASE H. PROSTATE, LEFT LATERAL MID I. PROSTATE, LEFT LATERAL APEX J. PROSTATE, RIGHT LATERAL BASE K. PROSTATE, RIGHT LATERAL MID L. PROSTATE, RIGHT LATERAL APEX  Clinical History: Elevated PSA R97.20 - Primary  DIAGNOSIS: Diagnostic Map   Benign  Atypical  Malignant  HGPIN     Diagnostic Summary  [A] PROSTATE, LEFT BASE:   NEGATIVE FOR MALIGNANCY.  [B] PROSTATE, LEFT MID:   NEGATIVE FOR MALIGNANCY.  [C] PROSTATE, LEFT APEX:   NEGATIVE FOR MALIGNANCY.  [D] PROSTATE, RIGHT BASE:   NEGATIVE FOR MALIGNANCY.  [E] PROSTATE, RIGHT MID:   NEGATIVE FOR MALIGNANCY.  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  [G] PROSTATE, LEFT LATERAL BASE:   NEGATIVE FOR MALIGNANCY.  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  [I]  PROSTATE, LEFT LATERAL APEX:   ACINAR ADENOCARCINOMA, GLEASON 3+3=6 (GG 1), INVOLVING 1/1 CORES, MEASURING 1  MM ( 7%).  [J] PROSTATE, RIGHT LATERAL BASE:   HIGH-GRADE PROSTATIC INTRAEPITHELIAL NEOPLASIA.  [K] PROSTATE, RIGHT LATERAL MID:   NEGATIVE FOR MALIGNANCY.  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     PIN3 stains were performed on parts B, C, and G for further characterization of the prostatic glands in those specimens. The PIN3 stains demonstrate a preserved basal myoepithelial layer by p63/HMWCK and an absence of luminal staining for P504S. These findings support the above diagnosis.  IHC slides were prepared by [facility], [place], [place]. All  controls stained appropriately.  This test was developed and its performance characteristics determined by [facility]. It has not been cleared or approved by the [place] Food and Drug Administration. The FDA does not require this test to go through premarket FDA review. This test is used for clinical purposes. It should not be regarded as investigational or for research. This laboratory is certified under the Clinical Laboratory Improvement Amendments (CLIA) as qualified to perform high complexity clinical laboratory testing.  GROSS DESCRIPTION: A. Labeled: Left base Received: Formalin Collection time: 9:43 AM on 04/13/2022 Placed into formalin time: 9:43 AM on 04/13/2022 Number of needle core biopsy(s): 3 Length: Range from 0.1-0.9 cm Diameter: 0.1 cm Description: Needle core biopsy fragments of tan soft tissue Ink: Blue Entirely submitted in 1 cassette.  B. Labeled: Left mid Received: Formalin Collection time: 9:43 AM on 04/13/2022 Placed into formalin time: 9:43 AM on  04/13/2022 Number of needle core biopsy(s): 1 Length: 1.3 cm Diameter: 0.1 cm Description: Needle core biopsy fragment of tan soft tissue Ink:  Blue Entirely submitted in 1 cassette.  C. Labeled: Left apex Received: Formalin Collection time: 9:43 AM on 04/13/2022 Placed into formalin time: 9:43 AM on 04/13/2022 Number of needle core biopsy(s): 3 Length: Range from 0.3-1.6 cm Diameter: 0.1 cm Description: Needle core biopsy fragments of tan soft tissue Ink: Blue Entirely submitted in 1 cassette.  D. Labeled: Right base Received: Formalin Collection time: 9:43 AM on 04/13/2022 Placed into formalin time: 9:43 AM on 04/13/2022 Number of needle core biopsy(s): 1 Length: 1.6 cm Diameter: 0.1 cm Description: Needle core biopsy fragment of tan soft tissue Ink: Blue Entirely submitted in 1 cassette.  E. Labeled: Right mid Received: Formalin Collection time: 9:43 AM on 04/13/2022 Placed into formalin time: 9:43 AM on 04/13/2022 Number of needle core biopsy(s): 2 Lengt h: Range from 0.1-1.2 cm Diameter: 0.1 cm Description: Needle core biopsy fragments of tan soft tissue Ink: Blue Entirely submitted in 1 cassette.  F.  Labeled: Right apex Received: Formalin Collection time: 9:43 AM on 04/13/2022 Placed into formalin time: 9:43 AM on 04/13/2022 Number of needle core biopsy(s): 1 Length: 1.5 cm Diameter: 0.1 cm Description: Needle core biopsy fragment of tan soft tissue Ink: Blue Entirely submitted in 1 cassette.  G. Labeled: Left lateral base Received: Formalin Collection time: 9:43 AM on 04/13/2022 Placed into formalin time: 9:43 AM on 04/13/2022 Number of needle core biopsy(s): 2 Length: Range from 0.2-1.3 cm Diameter: 0.1 cm Description: Needle core biopsy fragments of tan soft tissue Ink: Blue Entirely submitted in 1 cassette.  H. Labeled: Left lateral mid Received: Formalin Collection time: 9:43 AM on 04/13/2022 Placed into formalin time: 9:43 AM on 04/13/2022 Number of needle core biopsy(s): 3 Length: Range from 0.1-1.3  cm Diameter: 0.1 cm Description: Needle core biopsy fragments of tan soft tissue Ink:  Blue Entirely submitted in 1 cassette.  I. Labeled: Left lateral apex Received: Formalin Collection time: 9:43 AM on 04/13/2022 Placed into formalin time: 9:43 AM on 04/13/2022 Number of needle core biopsy(s): 2 Length: Range from 0.8-1.3 cm Diameter: 0.1 cm Description: Needle core biopsy fragments of tan soft tissue Ink: Blue Entirely submitted in 1 cassette.  J. Labeled: Right lateral base Received: Formalin Collection time: 9:43 AM on 04/13/2022 Placed into formalin time: 9:43 AM on 04/13/2022 Number of needle core biopsy(s): 1 Length: 1.8 cm Diameter: 0.1 cm Description: Needle core biopsy fragment of tan soft tissue Ink: Blue Entirely submitted in 1 cassette.  K. Labeled: Right lateral mid Received: Formalin Collection time: 9:43 AM on 04/13/2022 Placed into formalin time: 9:43 AM on 04/13/2022 Number of needle core biopsy(s): 2 Length: Range from 0.3-1.1 cm Diameter:  0.1 cm Description: Needle core biopsy fragments of tan soft tissue Ink: Blue Entirely submitted in 1 cassette.  L. Labeled: Right lateral apex Received: Formalin Collection time: 9:43 AM on 04/13/2022 Placed into formalin time: 9:43 AM on 04/13/2022 Number of needle core biopsy(s): 2 Length: Range from 0.1-1.5 cm Diameter: 0.1 cm Description: Needle core biopsy fragments of tan soft tissue Ink: Blue Entirely submitted in 1 cassette.  RB 04/13/2022  Final Diagnosis performed by Quay Burow, MD.   Electronically signed 04/15/2022 12:46:09PM The electronic signature indicates that the named Attending Pathologist has evaluated the specimen Technical component performed at [facility], [address], [place], [place] [address] Lab: [phone] Dir: Rush Farmer, MD, MMM  Professional component performed at [facility], [facility], [place], [place], [place] [address] Lab: [phone] Dir: Kathi Simpers, MD   Basic metabolic panel     Status: Abnormal   Collection Time: 05/19/22   7:23 AM  Result Value Ref Range   Sodium 141 135 - 145 mmol/L   Potassium 4.3 3.5 - 5.1 mmol/L   Chloride 110 98 - 111 mmol/L   CO2 25 22 - 32 mmol/L   Glucose, Bld 111 (H) 70 - 99 mg/dL    Comment: Glucose reference range applies only to samples taken after fasting for at least 8 hours.   BUN 16 8 - 23 mg/dL   Creatinine, Ser 0.99 0.61 - 1.24 mg/dL   Calcium 9.1 8.9 - 10.3 mg/dL   GFR, Estimated >60 >60 mL/min    Comment: (NOTE) Calculated using the CKD-EPI Creatinine Equation (2021)    Anion gap 6 5 - 15    Comment: Performed at [facility], [place] [address], [place], [place] [address]  CBC     Status: None   Collection Time:  05/19/22  7:23 AM  Result Value Ref Range   WBC 5.6 4.0 - 10.5 K/uL   RBC 4.84 4.22 - 5.81 MIL/uL   Hemoglobin 15.0 13.0 - 17.0 g/dL   HCT 45.3 39.0 - 52.0 %   MCV 93.6 80.0 - 100.0 fL   MCH 31.0 26.0 - 34.0 pg   MCHC 33.1 30.0 - 36.0 g/dL   RDW 12.8 11.5 - 15.5 %   Platelets 237 150 - 400 K/uL   nRBC 0.0 0.0 - 0.2 %    Comment: Performed at [facility], [place] [address] [place], [place] [address]  Troponin I (High Sensitivity)     Status: None   Collection Time: 05/19/22  7:23 AM  Result Value Ref Range   Troponin I (High Sensitivity) 5 <18 ng/L    Comment: (NOTE) Elevated high sensitivity troponin I (hsTnI) values and significant  changes across serial measurements may suggest ACS but many other  chronic and acute conditions are known to elevate hsTnI results.  Refer to the "Links" section for chest pain algorithms and additional  guidance. Performed at [facility], [place] [address], [place], [place] [address]   Troponin I (High Sensitivity)     Status: None   Collection Time: 05/19/22  9:50 AM  Result Value Ref Range   Troponin I (High Sensitivity) 4 <18 ng/L    Comment: (NOTE) Elevated high sensitivity troponin I (hsTnI) values and significant  changes across serial measurements may  suggest ACS but many other  chronic and acute conditions are known to elevate hsTnI results.  Refer to the "Links" section for chest pain algorithms and additional  guidance. Performed at [facility], [place] [address] [place], [place] [address]   CBC with Differential     Status: None   Collection Time: 06/08/22  4:07 PM  Result Value Ref Range   WBC 6.7 4.0 - 10.5 K/uL   RBC 5.01 4.22 - 5.81 MIL/uL   Hemoglobin 15.5 13.0 - 17.0 g/dL   HCT 45.2 39.0 - 52.0 %   MCV 90.2 80.0 - 100.0 fL   MCH 30.9 26.0 - 34.0 pg   MCHC 34.3 30.0 - 36.0 g/dL   RDW 12.8 11.5 - 15.5 %   Platelets 271 150 - 400 K/uL   nRBC 0.0 0.0 - 0.2 %   Neutrophils Relative % 59 %   Neutro Abs 4.0 1.7 - 7.7 K/uL   Lymphocytes Relative 27 %   Lymphs Abs 1.8 0.7 - 4.0 K/uL   Monocytes Relative 11 %   Monocytes Absolute 0.7 0.1 - 1.0 K/uL   Eosinophils Relative 2 %   Eosinophils Absolute 0.1 0.0 - 0.5 K/uL   Basophils Relative 1 %   Basophils Absolute 0.1 0.0 - 0.1 K/uL   Immature Granulocytes 0 %   Abs Immature Granulocytes 0.01 0.00 - 0.07 K/uL    Comment: Performed at [facility], [address] [place], [place] [address]  Comprehensive metabolic panel     Status: None   Collection Time: 06/08/22  4:07 PM  Result Value Ref Range   Sodium 141 135 - 145 mmol/L   Potassium 4.5 3.5 - 5.1 mmol/L   Chloride 109 98 - 111 mmol/L   CO2 25 22 - 32 mmol/L   Glucose, Bld 91 70 - 99 mg/dL    Comment: Glucose reference range applies only to samples taken after fasting for at least 8 hours.   BUN 16 8 -  23 mg/dL   Creatinine, Ser 0.86 0.61 - 1.24 mg/dL   Calcium 9.2 8.9 - 10.3 mg/dL   Total Protein 7.6 6.5 - 8.1 g/dL   Albumin 4.5 3.5 - 5.0 g/dL   AST 19 15 - 41 U/L   ALT 20 0 - 44 U/L   Alkaline Phosphatase 67 38 - 126 U/L   Total Bilirubin 0.6 0.3 - 1.2 mg/dL   GFR, Estimated >60 >60 mL/min    Comment: (NOTE) Calculated using the CKD-EPI Creatinine Equation (2021)    Anion gap 7  5 - 15    Comment: Performed at [facility], [address], [place], [place] [address]  Troponin I (High Sensitivity)     Status: None   Collection Time: 06/08/22  4:07 PM  Result Value Ref Range   Troponin I (High Sensitivity) 7 <18 ng/L    Comment: (NOTE) Elevated high sensitivity troponin I (hsTnI) values and significant  changes across serial measurements may suggest ACS but many other  chronic and acute conditions are known to elevate hsTnI results.  Refer to the "Links" section for chest pain algorithms and additional  guidance. Performed at [facility], [address] [place], [place] [address]   CBG monitoring, ED     Status: None   Collection Time: 06/08/22  7:47 PM  Result Value Ref Range   Glucose-Capillary 98 70 - 99 mg/dL    Comment: Glucose reference range applies only to samples taken after fasting for at least 8 hours.  Troponin I (High Sensitivity)     Status: None   Collection Time: 06/08/22  8:22 PM  Result Value Ref Range   Troponin I (High Sensitivity) 7 <18 ng/L    Comment: (NOTE) Elevated high sensitivity troponin I (hsTnI) values and significant  changes across serial measurements may suggest ACS but many other  chronic and acute conditions are known to elevate hsTnI results.  Refer to the "Links" section for chest pain algorithms and additional  guidance. Performed at [facility], [address] [place], [place] [address]     Assessment/Plan:  Pain in limb  Recommend:  The patient has atypical pain symptoms for pure atherosclerotic disease. However, on physical exam there is evidence of vascular disease, given the diminished pulses and the edema associated with venous changes of the legs.  Further investigation of the patient's vascular disease is necessary to determine the relationship of the patient's lower extremity symptoms and the degree of vascular disease.  Noninvasive studies of the will be obtained and  the patient will follow up with me to review these studies.  I suspect the patient is c/o pseudoclaudication.  Patient should have an evaluation of his LS spine which I defer to either the primary service or the Spine service.  The patient should continue walking and begin a more formal exercise program. The patient should continue his antiplatelet therapy and aggressive treatment of the lipid abnormalities.  Essential hypertension blood pressure control important in reducing the progression of atherosclerotic disease. On appropriate oral medications.   Malignant meningioma of meninges of brain [facility]) S/p resection 2014.  Prediabetes blood glucose control important in reducing the progression of atherosclerotic disease. Also, involved in wound healing. On appropriate medications.      Leotis Pain 06/14/2022, 10:03 AM   This note was created with Dragon medical transcription system.  Any errors from dictation are unintentional.

## 2022-06-14 NOTE — Assessment & Plan Note (Signed)
S/p resection 2014.

## 2022-06-14 NOTE — Assessment & Plan Note (Signed)
blood pressure control important in reducing the progression of atherosclerotic disease. On appropriate oral medications.  

## 2022-06-14 NOTE — Assessment & Plan Note (Signed)
blood glucose control important in reducing the progression of atherosclerotic disease. Also, involved in wound healing. On appropriate medications.  

## 2022-06-16 ENCOUNTER — Telehealth: Payer: Self-pay | Admitting: Neurosurgery

## 2022-06-16 NOTE — Telephone Encounter (Signed)
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 I spoke to the patient and he declined referral because he wants to see his Neurologist first on 06/20/2022. If he recommends neurosurgery f/u then patient will try to see one in [place].

## 2022-06-17 ENCOUNTER — Ambulatory Visit (INDEPENDENT_AMBULATORY_CARE_PROVIDER_SITE_OTHER): Payer: Medicare Other

## 2022-06-17 DIAGNOSIS — M79605 Pain in left leg: Secondary | ICD-10-CM

## 2022-06-20 ENCOUNTER — Inpatient Hospital Stay: Payer: Medicare Other | Attending: Internal Medicine | Admitting: Internal Medicine

## 2022-06-21 ENCOUNTER — Ambulatory Visit (HOSPITAL_COMMUNITY)
Admission: EM | Admit: 2022-06-21 | Discharge: 2022-06-21 | Disposition: A | Discharge status: Home / Self Care | Payer: Medicare Other

## 2022-06-21 ENCOUNTER — Encounter (HOSPITAL_COMMUNITY): Payer: Self-pay

## 2022-06-21 ENCOUNTER — Ambulatory Visit (INDEPENDENT_AMBULATORY_CARE_PROVIDER_SITE_OTHER): Payer: Medicare Other | Admitting: Vascular Surgery

## 2022-06-21 DIAGNOSIS — S161XXA Strain of muscle, fascia and tendon at neck level, initial encounter: Secondary | ICD-10-CM
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
## 2022-06-21 NOTE — Discharge Instructions (Addendum)
You have been evaluated in the today for your back pain. Your pain is most likely muscle strain which will improve on its own with time.   You may take naproxen 375 mg every 12 hours as needed for inflammation and pain.  Take this with food to avoid stomach upset.  You may also take your home supply of Robaxin muscle relaxer every 12 hours as needed for muscle spasm.  Do not take this medication and drive or drink alcohol as it can make you sleepy.  Mainly use this medicine at nighttime as needed.  Apply heat and perform gentle range of motion exercises to the area of greatest pain to prevent muscle stiffness and provide further pain relief.   Follow-up with orthopedic doctor tomorrow as scheduled.  Follow-up with your primary care provider or return to urgent care if your symptoms do not improve in the next 3 to 4 days with medications and interventions recommended today.  If you develop any new or worsening symptoms, please return to urgent care.  If your symptoms are severe, please go to the emergency room.  I hope you feel better!
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
## 2022-06-22 ENCOUNTER — Telehealth: Payer: Self-pay | Admitting: *Deleted

## 2022-06-22 ENCOUNTER — Ambulatory Visit (INDEPENDENT_AMBULATORY_CARE_PROVIDER_SITE_OTHER): Payer: Medicare Other

## 2022-06-22 ENCOUNTER — Ambulatory Visit (INDEPENDENT_AMBULATORY_CARE_PROVIDER_SITE_OTHER): Payer: Medicare Other | Admitting: Surgical

## 2022-06-22 ENCOUNTER — Ambulatory Visit: Payer: Self-pay

## 2022-06-22 DIAGNOSIS — M25511 Pain in right shoulder: Secondary | ICD-10-CM

## 2022-06-22 DIAGNOSIS — M25512 Pain in left shoulder: Secondary | ICD-10-CM | POA: Diagnosis not present

## 2022-06-22 DIAGNOSIS — M4802 Spinal stenosis, cervical region: Secondary | ICD-10-CM

## 2022-06-22 MED ORDER — ACETAMINOPHEN-CODEINE 300-30 MG PO TABS: 1.0000 | ORAL_TABLET | Freq: Every evening | ORAL | 0 refills | Status: DC | PRN
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
## 2022-06-26 ENCOUNTER — Encounter: Payer: Self-pay | Admitting: Surgical
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
## 2022-06-29 ENCOUNTER — Telehealth: Payer: Self-pay | Admitting: Orthopedic Surgery

## 2022-06-29 NOTE — Telephone Encounter (Signed)
Spoke with patient and he stated he is not interested in getting a neck injection. He stated he wants to only be seen for his shoulder. He understands what was seen on imaging, but wants to focus on the shoulder. I am closing referral sent for neck injection

## 2022-06-29 NOTE — Telephone Encounter (Signed)
Pt called stating Dr Marlou Sa was to send a referral for pt to see Dr. Ernestina Patches. Please send referral and call pt about this matter at [phone]

## 2022-06-29 NOTE — Telephone Encounter (Signed)
I called Joshua [name].  He would like to hold off on neck injection.  However, he does have a little bit of some degenerative changes in the glenohumeral joint on the left with good rotator cuff strength.  I think we could try a glenohumeral injection in the left shoulder as a diagnostic injection.  If he has limited relief from this injection, the neck step would be trying cervical spine ESI since a lot of his pain is radicular.  He was agreeable to this plan.  Can you make appointment for him on 07/04/2022 for 3:45 PM?

## 2022-06-30 ENCOUNTER — Ambulatory Visit (INDEPENDENT_AMBULATORY_CARE_PROVIDER_SITE_OTHER): Payer: Medicaid Other | Admitting: Podiatry

## 2022-06-30 DIAGNOSIS — M2041 Other hammer toe(s) (acquired), right foot: Secondary | ICD-10-CM

## 2022-06-30 DIAGNOSIS — M2042 Other hammer toe(s) (acquired), left foot: Secondary | ICD-10-CM

## 2022-06-30 DIAGNOSIS — G629 Polyneuropathy, unspecified: Secondary | ICD-10-CM

## 2022-06-30 NOTE — Telephone Encounter (Signed)
scheduled

## 2022-06-30 NOTE — Telephone Encounter (Signed)
You can add him on my schedule.  We are in clinic in PM it says on schedule

## 2022-07-01 ENCOUNTER — Telehealth: Payer: Self-pay

## 2022-07-01 NOTE — Telephone Encounter (Signed)
...     Pre-operative Risk Assessment    Patient Name: Joshua [name]  DOB: 03/12/1961 MRN: [id number]      Request for Surgical Clearance    Procedure:   COLONOSCOPY  Date of Surgery:  Clearance 07/26/22                                 Surgeon:  DR Otis Brace Surgeon's Group or Practice Name:  [facility] Phone number:  [phone] Fax number:  [phone]   Type of Clearance Requested:   - Medical  - Pharmacy:  Hold Aspirin     Type of Anesthesia:  Not Indicated   Additional requests/questions:    Gwenlyn Found   07/01/2022, 9:12 AM

## 2022-07-01 NOTE — Progress Notes (Signed)
Subjective:   Patient ID: Joshua [name], male   DOB: 61 y.o.   MRN: [id number]   HPI Patient presents stating he is getting a lot of irritation on the end of his second toe left foot and also has some generalized burning in his feet   ROS      Objective:  Physical Exam  Neurovascular status was found to be unchanged from previous visits with a crusted distal second digit left where a nail has been worked on and is bothersome.  Also has diminishment in somewhat of sharp dull vibratory and complaints about tingling burning     Assessment:  Appears to be more of a digital deformity versus a issue with nail or other pathology even though that becomes part of the issue     Plan:  H&P reviewed both conditions at great length.  We could consider arthroplasty but I like to hold off on this we will continue crest padding debridement and cushioning.  If symptoms persist to skin or probably require arthroplasty which I made him aware of today explaining the procedure and recovery.  Neuropathy continue the same treatments he has been doing and if it worsens we will consider other treatments that might be available

## 2022-07-01 NOTE — Telephone Encounter (Signed)
   Primary Cardiologist: Fransico Him, MD  Chart reviewed as part of pre-operative protocol coverage. Given past medical history and time since last visit, based on ACC/AHA guidelines, Joshua [name] would be at acceptable risk for the planned procedure without further cardiovascular testing.   He is able to achieve > 4 METS activity without concerning cardiac symptoms. He is at low risk for MACE for this low risk procedure.   He may hold aspirin for 5 to 7 days in the perioperative period.  I will route this recommendation to the requesting party via Epic fax function and remove from pre-op pool.  Please call with questions.  Emmaline Life, NP-C  07/01/2022, 11:06 AM [address] Office [phone] Fax [phone]

## 2022-07-04 ENCOUNTER — Telehealth: Payer: Self-pay | Admitting: Internal Medicine

## 2022-07-04 ENCOUNTER — Ambulatory Visit: Payer: Medicare Other | Admitting: Surgical

## 2022-07-04 ENCOUNTER — Inpatient Hospital Stay: Payer: Medicare Other | Admitting: Internal Medicine

## 2022-07-04 NOTE — Telephone Encounter (Signed)
Patient called back to r/s 12/18 appointment due to no transportation. Patient r/s and notified.

## 2022-07-04 NOTE — Telephone Encounter (Signed)
Patient called to confirm time for 12/18 appointment. Patient notified.

## 2022-07-07 ENCOUNTER — Inpatient Hospital Stay: Payer: Medicare Other | Admitting: Internal Medicine

## 2022-07-14 ENCOUNTER — Encounter (HOSPITAL_COMMUNITY): Payer: Self-pay

## 2022-07-14 ENCOUNTER — Emergency Department (HOSPITAL_COMMUNITY)
Admission: EM | Admit: 2022-07-14 | Discharge: 2022-07-15 | Disposition: A | Discharge status: Home / Self Care | Payer: Medicare Other | Attending: Emergency Medicine | Admitting: Emergency Medicine

## 2022-07-14 ENCOUNTER — Other Ambulatory Visit: Payer: Self-pay

## 2022-07-14 ENCOUNTER — Emergency Department (HOSPITAL_COMMUNITY): Payer: Medicare Other

## 2022-07-14 DIAGNOSIS — Z7982 Long term (current) use of aspirin: Secondary | ICD-10-CM | POA: Diagnosis not present

## 2022-07-14 DIAGNOSIS — R0789 Other chest pain: Secondary | ICD-10-CM | POA: Diagnosis not present

## 2022-07-14 DIAGNOSIS — Z79899 Other long term (current) drug therapy: Secondary | ICD-10-CM | POA: Insufficient documentation

## 2022-07-14 DIAGNOSIS — R404 Transient alteration of awareness: Secondary | ICD-10-CM | POA: Diagnosis not present

## 2022-07-14 DIAGNOSIS — R9431 Abnormal electrocardiogram [ECG] [EKG]: Secondary | ICD-10-CM | POA: Diagnosis not present

## 2022-07-14 DIAGNOSIS — I10 Essential (primary) hypertension: Secondary | ICD-10-CM | POA: Diagnosis not present

## 2022-07-14 DIAGNOSIS — R079 Chest pain, unspecified: Secondary | ICD-10-CM | POA: Insufficient documentation

## 2022-07-14 LAB — BASIC METABOLIC PANEL
Anion gap: 8 (ref 5–15)
BUN: 19 mg/dL (ref 8–23)
CO2: 23 mmol/L (ref 22–32)
Calcium: 8.8 mg/dL — ABNORMAL LOW (ref 8.9–10.3)
Chloride: 108 mmol/L (ref 98–111)
Creatinine, Ser: 0.93 mg/dL (ref 0.61–1.24)
GFR, Estimated: >60 mL/min (ref >60)
Glucose, Bld: 104 mg/dL — ABNORMAL HIGH (ref 70–99)
Potassium: 4.2 mmol/L (ref 3.5–5.1)
Sodium: 139 mmol/L (ref 135–145)

## 2022-07-14 LAB — CBC
HCT: 45 % (ref 39.0–52.0)
Hemoglobin: 15.1 g/dL (ref 13.0–17.0)
MCH: 31.1 pg (ref 26.0–34.0)
MCHC: 33.6 g/dL (ref 30.0–36.0)
MCV: 92.6 fL (ref 80.0–100.0)
Platelets: 225 10*3/uL (ref 150–400)
RBC: 4.86 MIL/uL (ref 4.22–5.81)
RDW: 12.7 % (ref 11.5–15.5)
WBC: 6.9 10*3/uL (ref 4.0–10.5)
nRBC: 0 % (ref 0.0–0.2)

## 2022-07-14 LAB — TROPONIN I (HIGH SENSITIVITY)
Troponin I (High Sensitivity): 6 ng/L (ref <18)
Troponin I (High Sensitivity): 5 ng/L (ref <18)
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
## 2022-07-14 NOTE — ED Provider Triage Note (Signed)
Emergency Medicine Provider Triage Evaluation Note  HARPER SMOKER , a 61 y.o. male  was evaluated in triage.  Pt complains of chest pain around 9AM that lasted for about an hour. Took '324mg'$  aspirin and valium with relief. No N/V or SOB. Pain is worse with movement.  Review of Systems  Positive: Chest pain Negative: SOB  Physical Exam  BP 128/78   Pulse 60   Temp 98.2 F (36.8 C) (Oral)   Resp 17   Ht '5\' 9"'$  (1.753 m)   Wt 86 kg   SpO2 99%   BMI 28.00 kg/m  Gen:   Awake, no distress   Resp:  Normal effort  MSK:   Moves extremities without difficulty   Medical Decision Making  Medically screening exam initiated at 2:59 PM.  Appropriate orders placed.  OLANDER FRIEDL was informed that the remainder of the evaluation will be completed by another provider, this initial triage assessment does not replace that evaluation, and the importance of remaining in the ED until their evaluation is complete.    Osvaldo Shipper, [place] 07/14/22 1501

## 2022-07-15 NOTE — ED Provider Notes (Signed)
[facility] EMERGENCY DEPARTMENT Provider Note   CSN: [id number] Arrival date & time: 07/14/22  1438     History  Chief Complaint  Patient presents with   Chest Pain    Joshua [name] is a 61 y.o. male with medical history of anxiety, dizziness, GERD, hypertension.  Patient presents ED for evaluation of chest pain.  Patient reports that yesterday morning around 9 AM he reached down to pick something up off the floor when he had a episode of chest pain that lasted for about 20 minutes.  Patient reports the chest pain was centralized, described as a burning sensation.  Patient reports that after chest pain resolved, he went to urgent care where they performed EKG and then advised him to head on the ED for further management as they were unable to rule out cardiac causes.  Patient states that he has not had any episodes of chest pain since yesterday at 9 AM.  Patient denies patient denies history of cardiac disease.  Patient denies shortness of breath, nausea or vomiting or abdominal pain, lightheadedness, dizziness, weakness.   Chest Pain Associated symptoms: no abdominal pain, no dizziness, no nausea, no shortness of breath, no vomiting and no weakness        Home Medications Prior to Admission medications   Medication Sig Start Date End Date Taking? Authorizing Provider  Accu-Chek Softclix Lancets lancets Use as instructed 08/13/21   Charlott Rakes, MD  acetaminophen (TYLENOL) 500 MG tablet Take 500 mg by mouth every 6 (six) hours as needed for moderate pain or headache.    [provider]  acetaminophen-codeine (TYLENOL #3) 300-30 MG tablet Take 1 tablet by mouth at bedtime as needed for moderate pain. Do not drive while taking this medication 06/22/22   Magnant, Charles L, PA-C  albuterol (VENTOLIN HFA) 108 (90 Base) MCG/ACT inhaler Inhale 2 puffs into the lungs every 6 (six) hours as needed for wheezing or shortness of breath. 08/13/21   Newlin, Charlane Ferretti,  MD  amLODipine (NORVASC) 5 MG tablet TAKE 1 TABLET (5 MG TOTAL) BY MOUTH DAILY. TO LOWER BLOOD PRESSURE. STOP LOSARTAN 08/13/21   Charlott Rakes, MD  aspirin 81 MG chewable tablet Chew 81 mg by mouth daily.    [provider]  atorvastatin (LIPITOR) 20 MG tablet TAKE 1 TABLET (20 MG TOTAL) BY MOUTH DAILY. 12/08/21   Charlott Rakes, MD  Blood Glucose Monitoring Suppl (ACCU-CHEK GUIDE ME) w/Device KIT Use to check blood sugar once daily. 01/07/21   Charlott Rakes, MD  clonazePAM (KLONOPIN) 0.5 MG tablet Take 1 tablet (0.5 mg total) by mouth at bedtime. 09/27/21   Vaslow, Acey Lav, MD  diazepam (VALIUM) 2 MG tablet Take 1 tablet (2 mg total) by mouth 2 (two) times daily. 08/15/21   Valarie Merino, MD  glucose blood (ACCU-CHEK GUIDE) test strip Use to check blood sugar once daily. 08/13/21   Charlott Rakes, MD  ketoconazole (NIZORAL) 2 % cream APPLY 1 APPLICATION. TOPICALLY DAILY. 02/28/22   Lorenda Peck, DPM  Lacosamide 100 MG TABS TAKE 1 TABLET (100 MG TOTAL) BY MOUTH IN THE MORNING AND AT BEDTIME. 03/01/22   Vaslow, Acey Lav, MD  lidocaine (LIDODERM) 5 % Place 1 patch onto the skin daily. Remove & Discard patch within 12 hours or as directed by MD Patient not taking: Reported on 06/14/2022 04/07/22   Evlyn Courier, PA-C  meclizine (ANTIVERT) 25 MG tablet Take 1 tablet (25 mg total) by mouth 3 (three) times daily as needed  for dizziness. 01/04/22   Horton, Alvin Critchley, DO  metFORMIN (GLUCOPHAGE) 500 MG tablet Take 1 tablet (500 mg total) by mouth 2 (two) times daily. 08/13/21   Charlott Rakes, MD  methocarbamol (ROBAXIN) 500 MG tablet Take 1 tablet (500 mg total) by mouth 2 (two) times daily. 04/07/22   Evlyn Courier, PA-C  naproxen (NAPROSYN) 375 MG tablet Take 1 tablet (375 mg total) by mouth 2 (two) times daily. 04/07/22   Evlyn Courier, PA-C  propranolol (INDERAL) 10 MG tablet Take 1 tablet (10 mg total) by mouth 3 (three) times daily as needed (palpitations). 05/31/22   Swinyer, Lanice Schwab, NP   tadalafil (CIALIS) 5 MG tablet Take 1 tablet (5 mg total) by mouth daily as needed for erectile dysfunction. 1 hour prior to sexual activity 01/17/22   Billey Co, MD  tamsulosin (FLOMAX) 0.4 MG CAPS capsule Take 1 capsule (0.4 mg total) by mouth daily. 11/23/21   Zara Council A, PA-C  VITAMIN A PO Take 1 capsule by mouth daily.    [provider]      Allergies    Patient has no known allergies.    Review of Systems   Review of Systems  Respiratory:  Negative for shortness of breath.   Cardiovascular:  Positive for chest pain.  Gastrointestinal:  Negative for abdominal pain, nausea and vomiting.  Neurological:  Negative for dizziness, weakness and light-headedness.  All other systems reviewed and are negative.   Physical Exam Updated Vital Signs BP 129/80 (BP Location: Right Arm)   Pulse (!) 51   Temp 98.8 F (37.1 C) (Oral)   Resp 18   Ht _0  (1.753 m)   Wt 86 kg   SpO2 100%   BMI 28.00 kg/m  Physical Exam Vitals and nursing note reviewed.  Constitutional:      General: He is not in acute distress.    Appearance: Normal appearance. He is not ill-appearing, toxic-appearing or diaphoretic.     Comments: Pleasant 61 year old male sitting in bed as I enter the room  HENT:     Head: Normocephalic and atraumatic.     Mouth/Throat:     Mouth: Mucous membranes are moist.     Pharynx: Oropharynx is clear.  Eyes:     Extraocular Movements: Extraocular movements intact.     Conjunctiva/sclera: Conjunctivae normal.     Pupils: Pupils are equal, round, and reactive to light.  Cardiovascular:     Rate and Rhythm: Normal rate and regular rhythm.     Comments: Regular rate rhythm Pulmonary:     Effort: Pulmonary effort is normal.     Breath sounds: Normal breath sounds. No wheezing.  Abdominal:     General: Abdomen is flat.     Tenderness: There is no abdominal tenderness.  Musculoskeletal:     Cervical back: Normal range of motion and neck supple. No  tenderness.     Right lower leg: No edema.     Left lower leg: No edema.  Skin:    General: Skin is warm and dry.     Capillary Refill: Capillary refill takes less than 2 seconds.  Neurological:     General: No focal deficit present.     Mental Status: He is alert and oriented to person, place, and time.     GCS: GCS eye subscore is 4. GCS verbal subscore is 5. GCS motor subscore is 6.     Cranial Nerves: Cranial nerves 2-12 are intact. No cranial nerve deficit.  Sensory: Sensation is intact. No sensory deficit.     Motor: Motor function is intact. No weakness.     Coordination: Coordination is intact. Heel to Shin Test normal.     ED Results / Procedures / Treatments   Labs (all labs ordered are listed, but only abnormal results are displayed) Labs Reviewed  BASIC METABOLIC PANEL - Abnormal; Notable for the following components:      Result Value   Glucose, Bld 104 (*)    Calcium 8.8 (*)    All other components within normal limits  CBC  TROPONIN I (HIGH SENSITIVITY)  TROPONIN I (HIGH SENSITIVITY)    EKG EKG Interpretation  Date/Time:  Thursday July 14 2022 14:54:19 EST Ventricular Rate:  60 PR Interval:  166 QRS Duration: 124 QT Interval:  422 QTC Calculation: 422 R Axis:   110 Text Interpretation: Normal sinus rhythm Right bundle branch block When compared with ECG of 08-Jun-2022 19:39, PREVIOUS ECG IS PRESENT Confirmed by Lajean Saver [phone]) on 07/15/2022 7:27:14 AM  Radiology DG Chest 2 View  Result Date: 07/14/2022 CLINICAL DATA:  Chest pain. EXAM: CHEST - 2 VIEW COMPARISON:  Chest/rib radiographs 06/05/2022 FINDINGS: The cardiomediastinal silhouette is unchanged with normal heart size. Aortic atherosclerosis is noted. No airspace consolidation, edema, pleural effusion, or pneumothorax is identified. Right shoulder arthroplasty is noted. IMPRESSION: No active cardiopulmonary disease. Electronically Signed   By: Logan Bores M.D.   On: 07/14/2022 15:29     Procedures Procedures   Medications Ordered in ED Medications - No data to display  ED Course/ Medical Decision Making/ A&P                           Medical Decision Making  61 year old male presents to the ED for evaluation of chest pain.  Please see HPI for further details.  On my examination the patient is sitting in bed in no apparent distress.  The patient is alert and oriented x 3.  The patient is afebrile and nontachycardic.  The patient has clear lung sounds bilaterally and he is not hypoxic on room air.  The patient abdomen is soft and compressible throughout.  Patient neurological examination shows no focal neurodeficits.  The patient has no peripheral edema bilaterally.  Patient workup initiated in triage includes troponin x 2, CBC, BMP, chest x-ray, EKG.  Chest x-ray shows no consolidations, effusions, cardiomegaly.  Patient CBC unremarkable, no leukocytosis or anemia.  Patient BMP unremarkable.  Patient troponin 6 and 5 respectively.  Patient EKG nonischemic.  At this time, patient in no apparent distress.  Patient denies any recurrence of chest pain since initial event yesterday morning.  Spoke with patient and advised him that he should most likely follow-up with his PCP for further management.  Advised the patient to return to the ED with any new or worsening signs or symptoms or repeat chest pain.  The patient voiced understanding of my instructions.  Patient had all of his questions answered to his satisfaction.  The patient is stable for discharge.  Plan of management discussed with attending Dr. Ashok Cordia who voiced agreement.   Final Clinical Impression(s) / ED Diagnoses Final diagnoses:  Chest pain, unspecified type    Rx / DC Orders ED Discharge Orders     None         Azucena Cecil, PA-C 07/15/22 2233    Tegeler, Gwenyth Allegra, MD 07/15/22 1451

## 2022-07-15 NOTE — Discharge Instructions (Addendum)
Return to the ED with any new or worsening signs or symptoms such as return of chest pain or shortness of breath Please follow-up with your cardiology team for further management Please continue taking GERD medication as prescribed Please read attached guide concerning nonspecific chest pain in adults

## 2022-07-15 NOTE — ED Notes (Signed)
Not in room. AVS papers not remaining/ present. D/c'd by other pt not visualized by this RN.

## 2022-07-19 ENCOUNTER — Ambulatory Visit: Payer: Medicare Other | Admitting: Urology

## 2022-07-19 ENCOUNTER — Inpatient Hospital Stay: Payer: 59 | Attending: Internal Medicine | Admitting: Internal Medicine

## 2022-07-20 NOTE — Progress Notes (Deleted)
07/21/2022 7:35 AM   [name] 1961/04/12 [id number]  Referring provider: Pediatrics, [facility] [address] [place],  [place] [address]  Urological history: 1. Low risk prostate cancer -PSA pending -iPSA 3.9 -prostate MRI which showed a small PI-RADS 4 lesion in the left peripheral zone mid gland/apex.  He underwent a cognitive biopsy on 04/13/2022 that showed a 41 g prostate with a PSA density of 0.10, 4 total cores were positive for low risk gleason score 3+3=6 prostate cancer with 60% max core involvement  2. BPH with LU TS -contributing factors of age, sleep apnea, HTN, neuropathy, arthritis, anxiety, diabetes, benzodiazepines, antihistamines -prostate volume on [place] (03/2022) 41 grams -I PSS *** -PVR *** mL -tamsulosin 0.4 mg daily   No chief complaint on file.   HPI: ZACARY BAUER is a 62 y.o. male who presents today for 6 month follow up.    Since he was last seen by [place] on 04/21/2022, he has been seen in the ED four times for multiple complaints.  Some of these visits have been for CP and dizziness.    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   Meningioma ([place])    Meningioma, recurrent of brain ([place]) 01/31/2019   Neuropathy 12/05/2019   Pre-diabetes    Rotator cuff tear    right   Seizures ([place])    11/27/19 last sz 1 wk ago    Surgical History: Past Surgical History:  Procedure Laterality Date   APPLICATION OF CRANIAL NAVIGATION N/A 01/10/2019   Procedure: APPLICATION OF CRANIAL NAVIGATION;  Surgeon: Erline Levine,  MD;  Location: [facility];  Service: Neurosurgery;  Laterality: N/A;   CATARACT EXTRACTION Right    CRANIOTOMY Right 11/06/2012   Procedure: CRANIOTOMY TUMOR EXCISION;  Surgeon: Erline Levine, MD;  Location: [facility];  Service: Neurosurgery;  Laterality: Right;  Right Parasagittal craniotomy for meningioma with [place]   CRANIOTOMY Right 01/10/2019   Procedure: Right Parasagittal Craniotomy for Tumor;  Surgeon: Erline Levine, MD;  Location: [facility];  Service: Neurosurgery;  Laterality: Right;  Right parasagittal craniotomy for tumor   ESOPHAGOGASTRODUODENOSCOPY     JOINT REPLACEMENT Right 2018   right hip   REVERSE SHOULDER ARTHROPLASTY Right 01/08/2021   Procedure: RIGHT REVERSE SHOULDER ARTHROPLASTY;  Surgeon: Meredith Pel, MD;  Location: [facility];  Service: Orthopedics;  Laterality: Right;   right knee arthroscopy     SHOULDER ARTHROSCOPY Right 06/09/2020   Procedure: RIGHT SHOULDER ARTHROSCOPY AND DEBRIDEMENT;  Surgeon: Newt Minion, MD;  Location: [facility];  Service: Orthopedics;  Laterality: Right;    Home Medications:  Allergies as of 07/21/2022   No Known Allergies      Medication List        Accurate as of July 20, 2022  7:35 AM. If you have any questions, ask your nurse or doctor.          Accu-Chek Guide Me w/Device Kit Use to check blood sugar once  daily.   Accu-Chek Guide test strip Generic drug: glucose blood Use to check blood sugar once daily.   Accu-Chek Softclix Lancets lancets Use as instructed   acetaminophen 500 MG tablet Commonly known as: TYLENOL Take 500 mg by mouth every 6 (six) hours as needed for moderate pain or headache.   acetaminophen-codeine 300-30 MG tablet Commonly known as: TYLENOL #3 Take 1 tablet by mouth at bedtime as needed for moderate pain. Do not drive while taking this medication   albuterol 108 (90 Base) MCG/ACT inhaler Commonly known as: VENTOLIN HFA Inhale 2 puffs into the lungs every 6 (six) hours as  needed for wheezing or shortness of breath.   amLODipine 5 MG tablet Commonly known as: NORVASC TAKE 1 TABLET (5 MG TOTAL) BY MOUTH DAILY. TO LOWER BLOOD PRESSURE. STOP LOSARTAN   aspirin 81 MG chewable tablet Chew 81 mg by mouth daily.   atorvastatin 20 MG tablet Commonly known as: LIPITOR TAKE 1 TABLET (20 MG TOTAL) BY MOUTH DAILY.   clonazePAM 0.5 MG tablet Commonly known as: KLONOPIN Take 1 tablet (0.5 mg total) by mouth at bedtime.   diazepam 2 MG tablet Commonly known as: VALIUM Take 1 tablet (2 mg total) by mouth 2 (two) times daily.   ketoconazole 2 % cream Commonly known as: NIZORAL APPLY 1 APPLICATION. TOPICALLY DAILY.   Lacosamide 100 MG Tabs TAKE 1 TABLET (100 MG TOTAL) BY MOUTH IN THE MORNING AND AT BEDTIME.   lidocaine 5 % Commonly known as: Lidoderm Place 1 patch onto the skin daily. Remove & Discard patch within 12 hours or as directed by MD   meclizine 25 MG tablet Commonly known as: ANTIVERT Take 1 tablet (25 mg total) by mouth 3 (three) times daily as needed for dizziness.   metFORMIN 500 MG tablet Commonly known as: GLUCOPHAGE Take 1 tablet (500 mg total) by mouth 2 (two) times daily.   methocarbamol 500 MG tablet Commonly known as: ROBAXIN Take 1 tablet (500 mg total) by mouth 2 (two) times daily.   naproxen 375 MG tablet Commonly known as: NAPROSYN Take 1 tablet (375 mg total) by mouth 2 (two) times daily.   propranolol 10 MG tablet Commonly known as: INDERAL Take 1 tablet (10 mg total) by mouth 3 (three) times daily as needed (palpitations).   tadalafil 5 MG tablet Commonly known as: CIALIS Take 1 tablet (5 mg total) by mouth daily as needed for erectile dysfunction. 1 hour prior to sexual activity   tamsulosin 0.4 MG Caps capsule Commonly known as: FLOMAX Take 1 capsule (0.4 mg total) by mouth daily.   VITAMIN A PO Take 1 capsule by mouth daily.        Allergies: No Known Allergies  Family History: Family History  Problem  Relation Age of Onset   Hypertension Mother    Dementia Mother    Diabetes Mother    Hypertension Father    CAD Father    Diabetes Father    CAD Brother     Social History:  reports that he quit smoking about 3 years ago. His smoking use included cigarettes. He has been exposed to tobacco smoke. He has never used smokeless tobacco. He reports that he does not currently use alcohol. He reports that he does not use drugs.  ROS: Pertinent ROS in HPI  Physical Exam: There were no vitals taken for this visit.  Constitutional:  Well nourished. Alert and oriented, No acute distress. HEENT: [place] AT, moist mucus membranes.  Trachea midline,  no masses. Cardiovascular: No clubbing, cyanosis, or edema. Respiratory: Normal respiratory effort, no increased work of breathing. GI: Abdomen is soft, non tender, non distended, no abdominal masses. Liver and spleen not palpable.  No hernias appreciated.  Stool sample for occult testing is not indicated.   GU: No CVA tenderness.  No bladder fullness or masses.  Patient with circumcised/uncircumcised phallus. ***Foreskin easily retracted***  Urethral meatus is patent.  No penile discharge. No penile lesions or rashes. Scrotum without lesions, cysts, rashes and/or edema.  Testicles are located scrotally bilaterally. No masses are appreciated in the testicles. Left and right epididymis are normal. Rectal: Patient with  normal sphincter tone. Anus and perineum without scarring or rashes. No rectal masses are appreciated. Prostate is approximately *** grams, *** nodules are appreciated. Seminal vesicles are normal. Skin: No rashes, bruises or suspicious lesions. Lymph: No cervical or inguinal adenopathy. Neurologic: Grossly intact, no focal deficits, moving all 4 extremities. Psychiatric: Normal mood and affect.  Laboratory Data: Lab Results  Component Value Date   WBC 6.9 07/14/2022   HGB 15.1 07/14/2022   HCT 45.0 07/14/2022   MCV 92.6 07/14/2022   PLT 225  07/14/2022    Lab Results  Component Value Date   CREATININE 0.93 07/14/2022    Lab Results  Component Value Date   HGBA1C 6.1 05/12/2021    Lab Results  Component Value Date   TSH 2.864 03/31/2021       Component Value Date/Time   CHOL 116 12/27/2021 1218   HDL 64 12/27/2021 1218   CHOLHDL 1.8 12/27/2021 1218   LDLCALC 40 12/27/2021 1218    Lab Results  Component Value Date   AST 19 06/08/2022   Lab Results  Component Value Date   ALT 20 06/08/2022  I have reviewed the labs.   Pertinent Imaging: N/A  I have independently reviewed the films.    Assessment & Plan:  ***  1. Low risk prostate cancer -PSA pending -will need a confirmatory biopsy in 6 months   2. BPH with LUTS -PSA stable *** -DRE benign *** -UA benign *** -PVR < 300 cc *** -symptoms - *** -most bothersome symptoms are *** -continue conservative management, avoiding bladder irritants and timed voiding's -Initiate alpha-blocker (***), discussed side effects *** -Initiate 5 alpha reductase inhibitor (***), discussed side effects *** -Continue tamsulosin 0.4 mg daily, alfuzosin 10 mg daily, Rapaflo 8 mg daily, terazosin, doxazosin, Cialis 5 mg daily and finasteride 5 mg daily, dutasteride 0.5 mg daily***:refills given -Cannot tolerate medication or medication failure, schedule cystoscopy ***     No follow-ups on file.  These notes generated with voice recognition software. I apologize for typographical errors.  [place], [place] [address]  [place], [place] [address] [phone]

## 2022-07-21 ENCOUNTER — Ambulatory Visit: Payer: Medicare Other | Admitting: Urology

## 2022-07-21 DIAGNOSIS — N138 Other obstructive and reflux uropathy: Secondary | ICD-10-CM

## 2022-07-21 DIAGNOSIS — C61 Malignant neoplasm of prostate: Secondary | ICD-10-CM

## 2022-07-22 ENCOUNTER — Encounter: Payer: Self-pay | Admitting: Urology

## 2022-07-29 DIAGNOSIS — H43393 Other vitreous opacities, bilateral: Secondary | ICD-10-CM | POA: Diagnosis not present

## 2022-08-01 ENCOUNTER — Ambulatory Visit: Payer: Self-pay

## 2022-08-01 ENCOUNTER — Ambulatory Visit (INDEPENDENT_AMBULATORY_CARE_PROVIDER_SITE_OTHER): Payer: 59 | Admitting: Surgical

## 2022-08-01 DIAGNOSIS — M19012 Primary osteoarthritis, left shoulder: Secondary | ICD-10-CM | POA: Diagnosis not present

## 2022-08-02 ENCOUNTER — Encounter: Payer: Self-pay | Admitting: Surgical

## 2022-08-02 MED ORDER — LIDOCAINE HCL 1 % IJ SOLN
5.0000 mL | INTRAMUSCULAR | Status: AC | PRN
  Administered 2022-08-01: 5 mL

## 2022-08-02 MED ORDER — METHYLPREDNISOLONE ACETATE 40 MG/ML IJ SUSP
40.0000 mg | INTRAMUSCULAR | Status: AC | PRN
  Administered 2022-08-01: 40 mg via INTRA_ARTICULAR

## 2022-08-02 MED ORDER — BUPIVACAINE HCL 0.5 % IJ SOLN
9.0000 mL | INTRAMUSCULAR | Status: AC | PRN
  Administered 2022-08-01: 9 mL via INTRA_ARTICULAR

## 2022-08-02 NOTE — Progress Notes (Signed)
   Procedure Note  Patient: Joshua [name]             Date of Birth: Jul 22, 1960           MRN: [id number]             Visit Date: 08/01/2022  Procedures: Visit Diagnoses:  1. Glenohumeral arthritis, left     Large Joint Inj: L glenohumeral on 08/01/2022 6:07 PM Indications: diagnostic evaluation and pain Details: 22 G 1.5 in and 3.5 in needle, ultrasound-guided posterior approach  Arthrogram: No  Medications: 9 mL bupivacaine 0.5 %; 40 mg methylPREDNISolone acetate 40 MG/ML; 5 mL lidocaine 1 % Outcome: tolerated well, no immediate complications  Patient returns for left glenohumeral injection.  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  Tolerated injection well.  Will see how this does.  Recommended he reach out to the office after 1 to 2 weeks to let us know how his shoulder is doing.  If he has great relief but it is short-lived, could consider MRI of the left shoulder for further evaluation of the extent of arthritis in the shoulder and if there is any rotator cuff damage. Procedure, treatment alternatives, risks and benefits explained, specific risks discussed. Consent was given by the patient. Immediately prior to procedure a time out was called to verify the correct patient, procedure, equipment, support staff and site/side marked as required. Patient was prepped and draped in the usual sterile fashion.

## 2022-08-23 ENCOUNTER — Other Ambulatory Visit: Payer: Self-pay | Admitting: Surgical

## 2022-08-23 ENCOUNTER — Other Ambulatory Visit: Payer: Self-pay | Admitting: Internal Medicine

## 2022-08-31 DIAGNOSIS — M62838 Other muscle spasm: Secondary | ICD-10-CM | POA: Diagnosis not present

## 2022-08-31 DIAGNOSIS — I10 Essential (primary) hypertension: Secondary | ICD-10-CM | POA: Diagnosis not present

## 2022-08-31 DIAGNOSIS — E118 Type 2 diabetes mellitus with unspecified complications: Secondary | ICD-10-CM | POA: Diagnosis not present

## 2022-09-13 DIAGNOSIS — R09A2 Foreign body sensation, throat: Secondary | ICD-10-CM | POA: Diagnosis not present

## 2022-09-13 DIAGNOSIS — R1313 Dysphagia, pharyngeal phase: Secondary | ICD-10-CM | POA: Diagnosis not present

## 2022-09-15 ENCOUNTER — Other Ambulatory Visit (HOSPITAL_BASED_OUTPATIENT_CLINIC_OR_DEPARTMENT_OTHER): Payer: Self-pay

## 2022-09-15 ENCOUNTER — Encounter (HOSPITAL_BASED_OUTPATIENT_CLINIC_OR_DEPARTMENT_OTHER): Payer: Self-pay | Admitting: Emergency Medicine

## 2022-09-15 ENCOUNTER — Emergency Department (HOSPITAL_BASED_OUTPATIENT_CLINIC_OR_DEPARTMENT_OTHER): Payer: 59

## 2022-09-15 ENCOUNTER — Emergency Department (HOSPITAL_BASED_OUTPATIENT_CLINIC_OR_DEPARTMENT_OTHER)
Admission: EM | Admit: 2022-09-15 | Discharge: 2022-09-15 | Disposition: A | Discharge status: Home / Self Care | Payer: 59 | Attending: Emergency Medicine | Admitting: Emergency Medicine

## 2022-09-15 ENCOUNTER — Other Ambulatory Visit: Payer: Self-pay

## 2022-09-15 DIAGNOSIS — E119 Type 2 diabetes mellitus without complications: Secondary | ICD-10-CM | POA: Insufficient documentation

## 2022-09-15 DIAGNOSIS — R202 Paresthesia of skin: Secondary | ICD-10-CM | POA: Diagnosis not present

## 2022-09-15 DIAGNOSIS — Z7982 Long term (current) use of aspirin: Secondary | ICD-10-CM | POA: Diagnosis not present

## 2022-09-15 DIAGNOSIS — R0789 Other chest pain: Secondary | ICD-10-CM | POA: Diagnosis not present

## 2022-09-15 DIAGNOSIS — Z7984 Long term (current) use of oral hypoglycemic drugs: Secondary | ICD-10-CM | POA: Insufficient documentation

## 2022-09-15 DIAGNOSIS — R220 Localized swelling, mass and lump, head: Secondary | ICD-10-CM | POA: Diagnosis not present

## 2022-09-15 LAB — CK: Total CK: 124 U/L (ref 49–397)

## 2022-09-15 LAB — BASIC METABOLIC PANEL
Anion gap: 8 (ref 5–15)
BUN: 14 mg/dL (ref 8–23)
CO2: 28 mmol/L (ref 22–32)
Calcium: 9.5 mg/dL (ref 8.9–10.3)
Chloride: 106 mmol/L (ref 98–111)
Creatinine, Ser: 0.94 mg/dL (ref 0.61–1.24)
GFR, Estimated: >60 mL/min (ref >60)
Glucose, Bld: 108 mg/dL — ABNORMAL HIGH (ref 70–99)
Potassium: 4.5 mmol/L (ref 3.5–5.1)
Sodium: 142 mmol/L (ref 135–145)

## 2022-09-15 LAB — CBC WITH DIFFERENTIAL/PLATELET
Abs Immature Granulocytes: 0.01 10*3/uL (ref 0.00–0.07)
Basophils Absolute: 0.1 10*3/uL (ref 0.0–0.1)
Basophils Relative: 1 %
Eosinophils Absolute: 0.1 10*3/uL (ref 0.0–0.5)
Eosinophils Relative: 2 %
HCT: 45.3 % (ref 39.0–52.0)
Hemoglobin: 15.7 g/dL (ref 13.0–17.0)
Immature Granulocytes: 0 %
Lymphocytes Relative: 28 %
Lymphs Abs: 1.6 10*3/uL (ref 0.7–4.0)
MCH: 31.8 pg (ref 26.0–34.0)
MCHC: 34.7 g/dL (ref 30.0–36.0)
MCV: 91.7 fL (ref 80.0–100.0)
Monocytes Absolute: 0.6 10*3/uL (ref 0.1–1.0)
Monocytes Relative: 11 %
Neutro Abs: 3.4 10*3/uL (ref 1.7–7.7)
Neutrophils Relative %: 58 %
Platelets: 241 10*3/uL (ref 150–400)
RBC: 4.94 MIL/uL (ref 4.22–5.81)
RDW: 13.1 % (ref 11.5–15.5)
WBC: 5.9 10*3/uL (ref 4.0–10.5)
nRBC: 0 % (ref 0.0–0.2)

## 2022-09-15 LAB — MAGNESIUM: Magnesium: 1.9 mg/dL (ref 1.7–2.4)

## 2022-09-15 NOTE — ED Provider Notes (Addendum)
[facility] Provider Note   CSN: [id number] Arrival date & time: 09/15/22  1456     History  Chief Complaint  Patient presents with   Tingling    Joshua [name] is a 62 y.o. male.  With PMH of meningioma status postresection, seizure disorder, pre DM who presents with paresthesias to right hand and right foot starting around 1400 today.  It has not been constant and more intermittent but he is still feeling and on exam.  He denies any cramping of the hands or inability to move the hand or the foot.  He has had no recent head injury.  He notes having history of seizures that are focal and similar to this never having generalized tonic-clonic seizures.  He does note he is on multiple seizure medications including Klonopin, Vimpat, and lamotrigine.  He is compliant with his seizure medicines and notes recently couple weeks to months ago he had his Vimpat cut in half.  He was drinking last night and had multiple beers.  He has not been drinking or doing any drugs today.  He has had no headache or neck pain.  No focal weakness, no visual change, no slurred speech or difficulty speaking or other complaints. No CP or SOB.  HPI     Home Medications Prior to Admission medications   Medication Sig Start Date End Date Taking? Authorizing Provider  Accu-Chek Softclix Lancets lancets Use as instructed 08/13/21   Charlott Rakes, MD  acetaminophen (TYLENOL) 500 MG tablet Take 500 mg by mouth every 6 (six) hours as needed for moderate pain or headache.    [provider]  acetaminophen-codeine (TYLENOL #3) 300-30 MG tablet Take 1 tablet by mouth at bedtime as needed for moderate pain. Do not drive while taking this medication 06/22/22   Magnant, Charles L, PA-C  albuterol (VENTOLIN HFA) 108 (90 Base) MCG/ACT inhaler Inhale 2 puffs into the lungs every 6 (six) hours as needed for wheezing or shortness of breath. 08/13/21   Newlin, Charlane Ferretti, MD   amLODipine (NORVASC) 5 MG tablet TAKE 1 TABLET (5 MG TOTAL) BY MOUTH DAILY. TO LOWER BLOOD PRESSURE. STOP LOSARTAN 08/13/21   Charlott Rakes, MD  aspirin 81 MG chewable tablet Chew 81 mg by mouth daily.    [provider]  atorvastatin (LIPITOR) 20 MG tablet TAKE 1 TABLET (20 MG TOTAL) BY MOUTH DAILY. 12/08/21   Charlott Rakes, MD  Blood Glucose Monitoring Suppl (ACCU-CHEK GUIDE ME) w/Device KIT Use to check blood sugar once daily. 01/07/21   Charlott Rakes, MD  clonazePAM (KLONOPIN) 0.5 MG tablet Take 1 tablet (0.5 mg total) by mouth at bedtime. 09/27/21   Vaslow, Acey Lav, MD  diazepam (VALIUM) 2 MG tablet Take 1 tablet (2 mg total) by mouth 2 (two) times daily. 08/15/21   Valarie Merino, MD  glucose blood (ACCU-CHEK GUIDE) test strip Use to check blood sugar once daily. 08/13/21   Charlott Rakes, MD  ketoconazole (NIZORAL) 2 % cream APPLY 1 APPLICATION. TOPICALLY DAILY. 02/28/22   Lorenda Peck, DPM  Lacosamide 100 MG TABS TAKE 1 TABLET (100 MG TOTAL) BY MOUTH IN THE MORNING AND AT BEDTIME. 08/24/22   Vaslow, Acey Lav, MD  lamoTRIgine (LAMICTAL) 200 MG tablet Take 200 mg by mouth 2 (two) times daily. 08/23/22   [provider]  lidocaine (LIDODERM) 5 % Place 1 patch onto the skin daily. Remove & Discard patch within 12 hours or as directed by MD Patient not taking:  Reported on 06/14/2022 04/07/22   Evlyn Courier, PA-C  meclizine (ANTIVERT) 25 MG tablet Take 1 tablet (25 mg total) by mouth 3 (three) times daily as needed for dizziness. 01/04/22   Horton, Alvin Critchley, DO  metFORMIN (GLUCOPHAGE) 500 MG tablet Take 1 tablet (500 mg total) by mouth 2 (two) times daily. 08/13/21   Charlott Rakes, MD  methocarbamol (ROBAXIN) 500 MG tablet Take 1 tablet (500 mg total) by mouth 2 (two) times daily. 04/07/22   Evlyn Courier, PA-C  naproxen (NAPROSYN) 375 MG tablet Take 1 tablet (375 mg total) by mouth 2 (two) times daily. 04/07/22   Evlyn Courier, PA-C  omeprazole (PRILOSEC) 20 MG capsule Take 20 mg by  mouth daily. 08/29/22   [provider]  propranolol (INDERAL) 10 MG tablet Take 1 tablet (10 mg total) by mouth 3 (three) times daily as needed (palpitations). 05/31/22   Swinyer, Lanice Schwab, NP  tadalafil (CIALIS) 5 MG tablet Take 1 tablet (5 mg total) by mouth daily as needed for erectile dysfunction. 1 hour prior to sexual activity 01/17/22   Billey Co, MD  tamsulosin (FLOMAX) 0.4 MG CAPS capsule Take 1 capsule (0.4 mg total) by mouth daily. 11/23/21   Zara Council A, PA-C  VITAMIN A PO Take 1 capsule by mouth daily.    [provider]      Allergies    Patient has no known allergies.    Review of Systems   Review of Systems  Physical Exam Updated Vital Signs BP (!) 148/92   Pulse 79   Temp 98.9 F (37.2 C)   Resp 16   Ht '5\' 9"'$  (1.753 m)   Wt 83.9 kg   SpO2 99%   BMI 27.32 kg/m  Physical Exam Constitutional: Alert and oriented. Well appearing and in no distress. Eyes: Conjunctivae are normal. ENT      Head: Normocephalic and atraumatic. Cardiovascular: S1, S2,  Normal and symmetric distal pulses are present in all extremities.Warm and well perfused. Respiratory: Normal respiratory effort.  O2 sat 99 on RA Gastrointestinal: Nondistended Musculoskeletal: Normal range of motion in all extremities.      Right lower leg: No tenderness or edema.      Left lower leg: No tenderness or edema. Neurologic: Normal speech and language.  CN II through XII grossly intact.  5 out of 5 strength bilateral upper and lower extremities.  Sensation grossly intact.  Normal finger-nose.  Reported paresthesias in right hand and right foot.   Skin: Skin is warm, dry and intact. No rash noted. Psychiatric: Mood and affect are normal. Speech and behavior are normal.  ED Results / Procedures / Treatments   Labs (all labs ordered are listed, but only abnormal results are displayed) Labs Reviewed  BASIC METABOLIC PANEL  CBC WITH DIFFERENTIAL/PLATELET  MAGNESIUM  CK     EKG EKG Interpretation  Date/Time:  Thursday September 15 2022 15:20:20 EST Ventricular Rate:  69 PR Interval:  147 QRS Duration: 129 QT Interval:  409 QTC Calculation: 439 R Axis:   77 Text Interpretation: Sinus rhythm Right bundle branch block No significant change since last tracing Confirmed by Georgina Snell [phone]) on 09/15/2022 4:13:04 PM  Radiology CT Head Wo Contrast  Result Date: 09/15/2022 CLINICAL DATA:  h/o meningioma right sided tingling EXAM: CT HEAD WITHOUT CONTRAST TECHNIQUE: Contiguous axial images were obtained from the base of the skull through the vertex without intravenous contrast. RADIATION DOSE REDUCTION: This exam was performed according to the departmental dose-optimization program  which includes automated exposure control, adjustment of the mA and/or kV according to patient size and/or use of iterative reconstruction technique. COMPARISON:  CT head June 08, 2022. FINDINGS: Brain: Similar appearance of postsurgical changes at the vertex, better evaluated on prior MRI. No evidence of acute large vascular territory infarct, acute hemorrhage, visible mass lesion, midline shift or hydrocephalus. Vascular: No hyperdense vessel identified. Skull: No acute fracture.  Craniotomy near the vertex. Sinuses/Orbits: Clear sinuses. No acute orbital findings. Other: No mastoid effusions. IMPRESSION: 1. No evidence of acute abnormality by noncontrast head CT. 2. Similar appearance of postsurgical changes at the vertex, better evaluated on prior MRI Electronically Signed   By: Margaretha Sheffield M.D.   On: 09/15/2022 15:58    Procedures Procedures  Remain in constant cardiac monitoring sinus rhythm with normal rates.  Medications Ordered in ED Medications - No data to display  ED Course/ Medical Decision Making/ A&P   {                            Medical Decision Making KASIM FRATTINI is a 62 y.o. male.  With PMH of meningioma status postresection, seizure  disorder, pre DM who presents with paresthesias to right hand and right foot starting around 1400 today.  It has not been constant and more intermittent but he is still feeling and on exam.  Patient with reported paresthesias of right hand and right foot but clinically no focal or concerning neurologic deficits on exam.  No recent head trauma.  Head CT obtained here which I personally reviewed no evidence of ICH.  Symptoms can have multiple underlying etiologies but consider recurrence of meningioma with history of previous, subclinical seizures, acute electrolyte abnormalities among multiple other etiologies.  Patient's labs in process but have not yet resulted.  Patient requesting to leave as he says he cannot leave in the dark or drive in the dark.  He is currently neurologically intact and hemodynamically stable.  I did discuss with him that we are unable to rule out any acute life-threatening electrolyte abnormalities at this time as they have not yet resulted he is understanding of this possibility.  He is not going to drink alcohol or do any drugs.  He is going to call his neurologist regarding his symptoms today for follow-up.  He is aware of return precautions and to call 911 if any worsening or new changes.  11:04 PM I did follow-up with patient's labs after patient left.  They are all unremarkable normal CK.  No acute electrolyte abnormalities.  Potassium 4.5.  Magnesium 1.9.    Amount and/or Complexity of Data Reviewed Labs: ordered. Radiology: ordered.    Final Clinical Impression(s) / ED Diagnoses Final diagnoses:  Paresthesias    Rx / DC Orders ED Discharge Orders     None         Elgie Congo, MD 09/15/22 1714    Elgie Congo, MD 09/15/22 [phone]

## 2022-09-15 NOTE — ED Triage Notes (Signed)
Pt arrives to ED with c/o tingling to right hand and foot that started at 1400. He reports seeing "floaters." Hx meningioma.

## 2022-09-15 NOTE — Discharge Instructions (Signed)
You were seen for tingling in your right hand and right foot.  Please do not miss any of your seizure medicines.  Call your neurologist as soon as possible to make a follow-up appointment.  You may need further workup including MRI.  You may need to discuss your seizure medicines.  Avoid any drugs or alcohol.  You are choosing to leave without results of your blood work.  We cannot fully rule out any life-threatening electrolyte issues at this time.  If you feel any worse or have any new symptoms you need to come back to the ER soon as possible.

## 2022-09-15 NOTE — ED Notes (Signed)
Pt called out asking to go home. Advised pt that his blood work had not yet resulted. Pt aware and decided he still did not want to stay. MD notified of same. MD at bedside with patient

## 2022-09-18 ENCOUNTER — Emergency Department (HOSPITAL_COMMUNITY)
Admission: EM | Admit: 2022-09-18 | Discharge: 2022-09-18 | Disposition: A | Discharge status: Home / Self Care | Payer: 59 | Attending: Emergency Medicine | Admitting: Emergency Medicine

## 2022-09-18 ENCOUNTER — Other Ambulatory Visit: Payer: Self-pay

## 2022-09-18 ENCOUNTER — Encounter (HOSPITAL_COMMUNITY): Payer: Self-pay

## 2022-09-18 ENCOUNTER — Emergency Department (HOSPITAL_COMMUNITY): Payer: 59

## 2022-09-18 DIAGNOSIS — Z7984 Long term (current) use of oral hypoglycemic drugs: Secondary | ICD-10-CM | POA: Diagnosis not present

## 2022-09-18 DIAGNOSIS — R0789 Other chest pain: Secondary | ICD-10-CM | POA: Diagnosis not present

## 2022-09-18 DIAGNOSIS — Z1152 Encounter for screening for COVID-19: Secondary | ICD-10-CM | POA: Diagnosis not present

## 2022-09-18 DIAGNOSIS — I10 Essential (primary) hypertension: Secondary | ICD-10-CM | POA: Insufficient documentation

## 2022-09-18 DIAGNOSIS — R0602 Shortness of breath: Secondary | ICD-10-CM | POA: Insufficient documentation

## 2022-09-18 DIAGNOSIS — Z79899 Other long term (current) drug therapy: Secondary | ICD-10-CM | POA: Insufficient documentation

## 2022-09-18 DIAGNOSIS — R079 Chest pain, unspecified: Secondary | ICD-10-CM

## 2022-09-18 LAB — RESP PANEL BY RT-PCR (RSV, FLU A&B, COVID)  RVPGX2
Influenza A by PCR: NEGATIVE
Influenza B by PCR: NEGATIVE
Resp Syncytial Virus by PCR: NEGATIVE
SARS Coronavirus 2 by RT PCR: NEGATIVE

## 2022-09-18 LAB — COMPREHENSIVE METABOLIC PANEL
ALT: 22 U/L (ref 0–44)
AST: 28 U/L (ref 15–41)
Albumin: 4.4 g/dL (ref 3.5–5.0)
Alkaline Phosphatase: 60 U/L (ref 38–126)
Anion gap: 7 (ref 5–15)
BUN: 15 mg/dL (ref 8–23)
CO2: 26 mmol/L (ref 22–32)
Calcium: 9.1 mg/dL (ref 8.9–10.3)
Chloride: 106 mmol/L (ref 98–111)
Creatinine, Ser: 0.88 mg/dL (ref 0.61–1.24)
GFR, Estimated: >60 mL/min (ref >60)
Glucose, Bld: 98 mg/dL (ref 70–99)
Potassium: 4.8 mmol/L (ref 3.5–5.1)
Sodium: 139 mmol/L (ref 135–145)
Total Bilirubin: 0.9 mg/dL (ref 0.3–1.2)
Total Protein: 7.8 g/dL (ref 6.5–8.1)

## 2022-09-18 LAB — CBC WITH DIFFERENTIAL/PLATELET
Abs Immature Granulocytes: 0.02 10*3/uL (ref 0.00–0.07)
Basophils Absolute: 0.1 10*3/uL (ref 0.0–0.1)
Basophils Relative: 1 %
Eosinophils Absolute: 0.2 10*3/uL (ref 0.0–0.5)
Eosinophils Relative: 2 %
HCT: 46.7 % (ref 39.0–52.0)
Hemoglobin: 15.4 g/dL (ref 13.0–17.0)
Immature Granulocytes: 0 %
Lymphocytes Relative: 28 %
Lymphs Abs: 1.8 10*3/uL (ref 0.7–4.0)
MCH: 31.4 pg (ref 26.0–34.0)
MCHC: 33 g/dL (ref 30.0–36.0)
MCV: 95.1 fL (ref 80.0–100.0)
Monocytes Absolute: 0.7 10*3/uL (ref 0.1–1.0)
Monocytes Relative: 11 %
Neutro Abs: 3.7 10*3/uL (ref 1.7–7.7)
Neutrophils Relative %: 58 %
Platelets: 246 10*3/uL (ref 150–400)
RBC: 4.91 MIL/uL (ref 4.22–5.81)
RDW: 13 % (ref 11.5–15.5)
WBC: 6.4 10*3/uL (ref 4.0–10.5)
nRBC: 0 % (ref 0.0–0.2)

## 2022-09-18 LAB — MAGNESIUM: Magnesium: 2.1 mg/dL (ref 1.7–2.4)

## 2022-09-18 LAB — TROPONIN I (HIGH SENSITIVITY)
Troponin I (High Sensitivity): 5 ng/L (ref <18)
Troponin I (High Sensitivity): 6 ng/L (ref <18)

## 2022-09-18 LAB — LIPASE, BLOOD: Lipase: 42 U/L (ref 11–51)

## 2022-09-18 MED ORDER — ALUM & MAG HYDROXIDE-SIMETH 200-200-20 MG/5ML PO SUSP
30.0000 mL | Freq: Once | ORAL | Status: AC
  Administered 2022-09-18: 30 mL via ORAL
  Filled 2022-09-18: qty 30

## 2022-09-18 MED ORDER — FENTANYL CITRATE PF 50 MCG/ML IJ SOSY
50.0000 ug | PREFILLED_SYRINGE | INTRAMUSCULAR | Status: DC | PRN
  Administered 2022-09-18: 50 ug via INTRAVENOUS
  Filled 2022-09-18: qty 1

## 2022-09-18 MED ORDER — LIDOCAINE VISCOUS HCL 2 % MT SOLN
15.0000 mL | Freq: Once | OROMUCOSAL | Status: AC
  Administered 2022-09-18: 15 mL via ORAL
  Filled 2022-09-18: qty 15

## 2022-09-18 MED ORDER — ASPIRIN 81 MG PO CHEW
162.0000 mg | CHEWABLE_TABLET | Freq: Once | ORAL | Status: AC
  Administered 2022-09-18: 162 mg via ORAL
  Filled 2022-09-18: qty 2

## 2022-09-18 MED ORDER — FAMOTIDINE IN NACL 20-0.9 MG/50ML-% IV SOLN
20.0000 mg | Freq: Once | INTRAVENOUS | Status: AC
  Administered 2022-09-18: 20 mg via INTRAVENOUS
  Filled 2022-09-18: qty 50

## 2022-09-18 NOTE — Discharge Instructions (Addendum)
Continue taking your daily omeprazole.  You can buy over-the-counter Maalox to take as needed for symptoms of reflux.  You should hear from cardiology office in the next couple days.  If you do not hear from them, call the number below to set up a follow-up appointment.  Return to the emergency department at anytime for any new or worsening symptoms of concern.

## 2022-09-18 NOTE — ED Notes (Signed)
Xray at BS 

## 2022-09-18 NOTE — ED Triage Notes (Signed)
States that he started having chest pain x1 hour. Also states he is having SOB

## 2022-09-18 NOTE — ED Notes (Signed)
EDP at BS 

## 2022-09-18 NOTE — ED Provider Triage Note (Signed)
Emergency Medicine Provider Triage Evaluation Note  Joshua [name] , a 62 y.o. male  was evaluated in triage.  Pt complains of chest pain.  He reports that chest pain began about 1 hour prior to arriving to the emergency department.  He reports that he was not exerting himself prior to the onset of the chest pain.  No prior history of any cardiovascular disease but does have hypertension.  He does report that he took 2 a 1 mg dose of aspirin and 1 pill for "flutter" he is unsure what this medication is for.  He reports he has previously had some palpitations and been worked up without any definitive findings.  Denies nausea, vomiting, diaphoresis, fevers, shortness of breath..  Review of Systems  Positive: As above Negative: As above  Physical Exam  Ht '5\' 9"'$  (1.753 m)   Wt 81.6 kg   BMI 26.58 kg/m  Gen:   Awake, no distress   Resp:  Normal effort  MSK:   Moves extremities without difficulty  Other:    Medical Decision Making  Medically screening exam initiated at 3:36 PM.  Appropriate orders placed.  Joshua [name] was informed that the remainder of the evaluation will be completed by another provider, this initial triage assessment does not replace that evaluation, and the importance of remaining in the ED until their evaluation is complete.     Luvenia Heller, PA-C 09/18/22 1537

## 2022-09-18 NOTE — ED Provider Notes (Signed)
[place] EMERGENCY DEPARTMENT AT [facility] Provider Note   CSN: [id number] Arrival date & time: 09/18/22  1526     History  Chief Complaint  Patient presents with   Chest Pain    Joshua [name] is a 62 y.o. male.   Chest Pain Associated symptoms: palpitations and shortness of breath   Patient presents for chest pain.  Medical history includes seizures, meningioma, HTN, prediabetes, anxiety, GERD, HLD.  He was seen in the ED 3 days ago for right hand and foot paresthesias.  Earlier today, he was in his normal state of health.  Approximately 1 hour prior to arrival, while driving, he experienced substernal sharp chest pains.  Chest pains have been intermittent since that time.  When they occur, there are 6/10 in severity.  He has experienced some associated shortness of breath.  He denies any nausea or diaphoresis.  Currently, he is pain-free.  Patient denies tobacco use but does vape.  He does have a family history of early ACS.  He has seen a cardiologist before for heart flutters.  He was given a as needed medication which she did take after onset of his chest pain today.  He does not recall the name of that medication.  Per chart review, he is established with cardiology.  He was last seen in the office in November of last year.  He has undergone prior CTA chest which showed aortic atherosclerotic disease.  He underwent stress test 3 years ago.  Prior episodes of palpitations have been attributed to PACs based on cardiac monitoring last year.  As needed medication that he has been prescribed is propranolol.     Home Medications Prior to Admission medications   Medication Sig Start Date End Date Taking? Authorizing Provider  acetaminophen (TYLENOL) 500 MG tablet Take 1,000 mg by mouth every 6 (six) hours as needed for moderate pain or headache.   Yes [provider]  albuterol (VENTOLIN HFA) 108 (90 Base) MCG/ACT inhaler Inhale 2 puffs into the lungs every 6  (six) hours as needed for wheezing or shortness of breath. 08/13/21  Yes Newlin, Enobong, MD  amLODipine (NORVASC) 5 MG tablet TAKE 1 TABLET (5 MG TOTAL) BY MOUTH DAILY. TO LOWER BLOOD PRESSURE. STOP LOSARTAN Patient taking differently: Take 5 mg by mouth daily. 08/13/21  Yes Charlott Rakes, MD  aspirin 81 MG chewable tablet Chew 81 mg by mouth daily.   Yes [provider]  atorvastatin (LIPITOR) 20 MG tablet TAKE 1 TABLET (20 MG TOTAL) BY MOUTH DAILY. 12/08/21  Yes Charlott Rakes, MD  clonazePAM (KLONOPIN) 0.5 MG tablet Take 1 tablet (0.5 mg total) by mouth at bedtime. Patient taking differently: Take 0.5 mg by mouth daily as needed for anxiety. 09/27/21  Yes Vaslow, Acey Lav, MD  ketoconazole (NIZORAL) 2 % cream APPLY 1 APPLICATION. TOPICALLY DAILY. Patient taking differently: Apply 1 Application topically daily as needed for irritation. 02/28/22  Yes Lorenda Peck, DPM  Lacosamide 100 MG TABS TAKE 1 TABLET (100 MG TOTAL) BY MOUTH IN THE MORNING AND AT BEDTIME. 08/24/22  Yes Vaslow, Acey Lav, MD  lamoTRIgine (LAMICTAL) 200 MG tablet Take 200 mg by mouth 2 (two) times daily. 08/23/22  Yes [provider]  meclizine (ANTIVERT) 25 MG tablet Take 1 tablet (25 mg total) by mouth 3 (three) times daily as needed for dizziness. 01/04/22  Yes Horton, Alvin Critchley, DO  metFORMIN (GLUCOPHAGE) 500 MG tablet Take 1 tablet (500 mg total) by mouth 2 (two) times  daily. 08/13/21  Yes Charlott Rakes, MD  methocarbamol (ROBAXIN) 750 MG tablet Take 750 mg by mouth daily as needed for muscle spasms.   Yes [provider]  omeprazole (PRILOSEC) 20 MG capsule Take 20 mg by mouth daily. 08/29/22  Yes [provider]  propranolol (INDERAL) 10 MG tablet Take 1 tablet (10 mg total) by mouth 3 (three) times daily as needed (palpitations). 05/31/22  Yes Swinyer, Lanice Schwab, NP  tadalafil (CIALIS) 5 MG tablet Take 1 tablet (5 mg total) by mouth daily as needed for erectile dysfunction. 1 hour prior  to sexual activity 01/17/22  Yes Billey Co, MD  tamsulosin (FLOMAX) 0.4 MG CAPS capsule Take 1 capsule (0.4 mg total) by mouth daily. Patient taking differently: Take 0.4 mg by mouth at bedtime. 11/23/21  Yes McGowan, Larene Beach A, PA-C  VITAMIN A PO Take 1 capsule by mouth daily.   Yes [provider]  Accu-Chek Softclix Lancets lancets Use as instructed 08/13/21   Charlott Rakes, MD  acetaminophen-codeine (TYLENOL #3) 300-30 MG tablet Take 1 tablet by mouth at bedtime as needed for moderate pain. Do not drive while taking this medication Patient not taking: Reported on 09/18/2022 06/22/22   Magnant, Gerrianne Scale, PA-C  Blood Glucose Monitoring Suppl (ACCU-CHEK GUIDE ME) w/Device KIT Use to check blood sugar once daily. 01/07/21   Charlott Rakes, MD  diazepam (VALIUM) 2 MG tablet Take 1 tablet (2 mg total) by mouth 2 (two) times daily. Patient not taking: Reported on 09/18/2022 08/15/21   Valarie Merino, MD  glucose blood (ACCU-CHEK GUIDE) test strip Use to check blood sugar once daily. 08/13/21   Charlott Rakes, MD  lidocaine (LIDODERM) 5 % Place 1 patch onto the skin daily. Remove & Discard patch within 12 hours or as directed by MD Patient not taking: Reported on 06/14/2022 04/07/22   Evlyn Courier, PA-C      Allergies    Patient has no known allergies.    Review of Systems   Review of Systems  Respiratory:  Positive for shortness of breath.   Cardiovascular:  Positive for chest pain and palpitations.  All other systems reviewed and are negative.   Physical Exam Updated Vital Signs BP 119/75   Pulse (!) 57   Temp 98.2 F (36.8 C) (Oral)   Resp 18   Ht '5\' 9"'$  (1.753 m)   Wt 81.6 kg   SpO2 98%   BMI 26.58 kg/m  Physical Exam Vitals and nursing note reviewed.  Constitutional:      General: He is not in acute distress.    Appearance: He is well-developed. He is not ill-appearing, toxic-appearing or diaphoretic.  HENT:     Head: Normocephalic and atraumatic.  Eyes:      Conjunctiva/sclera: Conjunctivae normal.  Neck:     Vascular: No JVD.  Cardiovascular:     Rate and Rhythm: Normal rate and regular rhythm.     Heart sounds: No murmur heard.    No friction rub. No gallop.  Pulmonary:     Effort: Pulmonary effort is normal. No tachypnea or respiratory distress.     Breath sounds: Normal breath sounds. No decreased breath sounds, wheezing, rhonchi or rales.  Chest:     Chest wall: No tenderness.  Abdominal:     Palpations: Abdomen is soft.     Tenderness: There is no abdominal tenderness.  Musculoskeletal:        General: No swelling.     Cervical back: Normal range of  motion and neck supple.     Right lower leg: No edema.     Left lower leg: No edema.  Skin:    General: Skin is warm and dry.     Coloration: Skin is not cyanotic or pale.  Neurological:     General: No focal deficit present.     Mental Status: He is alert and oriented to person, place, and time.  Psychiatric:        Mood and Affect: Mood normal.        Behavior: Behavior normal.     ED Results / Procedures / Treatments   Labs (all labs ordered are listed, but only abnormal results are displayed) Labs Reviewed  RESP PANEL BY RT-PCR (RSV, FLU A&B, COVID)  [id number]  COMPREHENSIVE METABOLIC PANEL  CBC WITH DIFFERENTIAL/PLATELET  MAGNESIUM  LIPASE, BLOOD  TROPONIN I (HIGH SENSITIVITY)  TROPONIN I (HIGH SENSITIVITY)    EKG EKG Interpretation  Date/Time:  Sunday September 18 2022 15:35:13 EST Ventricular Rate:  81 PR Interval:  149 QRS Duration: 130 QT Interval:  386 QTC Calculation: 448 R Axis:   88 Text Interpretation: Sinus rhythm Right bundle branch block Confirmed by Godfrey Pick ([id number]) on 09/18/2022 4:00:40 PM  Radiology DG Chest Portable 1 View  Result Date: 09/18/2022 CLINICAL DATA:  Chest pain and SOB for 1 hour. EXAM: PORTABLE CHEST 1 VIEW COMPARISON:  07/14/2022 FINDINGS: Heart size is normal. Aortic atherosclerosis. Low lung volumes. No pleural fluid or airspace  disease. Previous right shoulder arthroplasty. IMPRESSION: Low lung volumes.  No acute abnormality. Electronically Signed   By: Kerby Moors M.D.   On: 09/18/2022 16:06    Procedures Procedures    Medications Ordered in ED Medications  fentaNYL (SUBLIMAZE) injection 50 mcg (50 mcg Intravenous Given 09/18/22 1609)  aspirin chewable tablet 162 mg (162 mg Oral Given 09/18/22 1607)  alum & mag hydroxide-simeth (MAALOX/MYLANTA) 200-200-20 MG/5ML suspension 30 mL (30 mLs Oral Given 09/18/22 1726)    And  lidocaine (XYLOCAINE) 2 % viscous mouth solution 15 mL (15 mLs Oral Given 09/18/22 1726)  famotidine (PEPCID) IVPB 20 mg premix (0 mg Intravenous Stopped 09/18/22 1744)    ED Course/ Medical Decision Making/ A&P                             Medical Decision Making Amount and/or Complexity of Data Reviewed Labs: ordered.  Risk OTC drugs. Prescription drug management.   This patient presents to the ED for concern of chest pain, this involves an extensive number of treatment options, and is a complaint that carries with it a high risk of complications and morbidity.  The differential diagnosis includes ACS, GERD, pericarditis, costochondritis   Co morbidities that complicate the patient evaluation  seizures, meningioma, HTN, prediabetes, anxiety, GERD, HLD   Additional history obtained:  Additional history obtained from N/A External records from outside source obtained and reviewed including EMR   Lab Tests:  I Ordered, and personally interpreted labs.  The pertinent results include: Normal hemoglobin, no leukocytosis, normal troponins x 2, normal kidney function, normal electrolytes, normal lipase, normal hepatobiliary enzymes   Imaging Studies ordered:  I ordered imaging studies including chest x-ray I independently visualized and interpreted imaging which showed no acute findings I agree with the radiologist interpretation   Cardiac Monitoring: / EKG:  The patient was  maintained on a cardiac monitor.  I personally viewed and interpreted the cardiac monitored which showed an underlying  rhythm of: Sinus rhythm  Problem List / ED Course / Critical interventions / Medication management  Patient presents for acute onset of sharp substernal chest pain.  This occurred 1 hour prior to arrival.  At the time he was driving.  Symptoms have been intermittent since that time.  He has had associated shortness of breath.  EKG shows no changes from prior tracings.  On assessment, patient is well-appearing.  At this time, he does report resolution of pain.  He does take a daily 81 mg aspirin.  He took an extra aspirin and a propranolol tablet prior to arrival.  Additional 162 of ASA was ordered.  Laboratory workup was initiated.  Patient's initial lab work results, including troponin are reassuring.  GI cocktail was ordered for empiric treatment of GERD etiology.  Following GI cocktail, patient reports significant improvement in symptoms.  He was started on Prilosec last month.  He states that he has been taking this daily.  He was advised to continue daily use and take Maalox as needed.  Second troponin was normal.  Patient was discharged in stable condition.   I ordered medication including ASA for concern of ACS; GI cocktail for empiric treatment of reflux Reevaluation of the patient after these medicines showed that the patient resolved I have reviewed the patients home medicines and have made adjustments as needed   Social Determinants of Health:  Has access to outpatient care        Final Clinical Impression(s) / ED Diagnoses Final diagnoses:  Chest pain, unspecified type    Rx / DC Orders ED Discharge Orders          Ordered    Ambulatory referral to Cardiology       Comments: If you have not heard from the Cardiology office within the next 72 hours please call [phone].   09/18/22 1823              Godfrey Pick, MD 09/18/22 1825

## 2022-09-18 NOTE — ED Notes (Signed)
EDP into room, at BS.  ?

## 2022-09-19 ENCOUNTER — Inpatient Hospital Stay: Payer: 59 | Attending: Internal Medicine | Admitting: Internal Medicine

## 2022-09-19 ENCOUNTER — Telehealth: Payer: Self-pay | Admitting: Family Medicine

## 2022-09-19 VITALS — BP 133/70 | HR 59 | Temp 97.7°F | Resp 20 | Wt 188.7 lb

## 2022-09-19 DIAGNOSIS — G40109 Localization-related (focal) (partial) symptomatic epilepsy and epileptic syndromes with simple partial seizures, not intractable, without status epilepticus: Secondary | ICD-10-CM | POA: Insufficient documentation

## 2022-09-19 DIAGNOSIS — Z79899 Other long term (current) drug therapy: Secondary | ICD-10-CM | POA: Insufficient documentation

## 2022-09-19 DIAGNOSIS — D329 Benign neoplasm of meninges, unspecified: Secondary | ICD-10-CM | POA: Insufficient documentation

## 2022-09-19 DIAGNOSIS — R569 Unspecified convulsions: Secondary | ICD-10-CM

## 2022-09-19 DIAGNOSIS — C700 Malignant neoplasm of cerebral meninges: Secondary | ICD-10-CM | POA: Diagnosis not present

## 2022-09-19 DIAGNOSIS — Z87891 Personal history of nicotine dependence: Secondary | ICD-10-CM | POA: Diagnosis not present

## 2022-09-19 MED ORDER — LACOSAMIDE 100 MG PO TABS: 100.0000 mg | ORAL_TABLET | Freq: Two times a day (BID) | ORAL | 2 refills | Status: DC

## 2022-09-19 MED ORDER — CLONAZEPAM 0.5 MG PO TABS: 0.5000 mg | ORAL_TABLET | Freq: Every day | ORAL | 2 refills | Status: DC

## 2022-09-19 NOTE — Progress Notes (Signed)
[facility] at [place], [place] [address] [phone]   Interval Evaluation  Date of Service: 09/19/22 Patient Name: Joshua [name] Patient MRN: [id number] Patient DOB: 1961/05/14 Provider: [name], MD  Identifying Statement:  Joshua [name] is a 62 y.o. male with  parasaggital  meningioma and focal epilepsy  Oncologic History: 10/30/12: Craniotomy and resection of parasaggital meningioma by Dr. Vertell Limber (WHO I) 01/10/19: Repeat craniotomy, debulking resection by Dr. Vertell Limber after tumor recurrence, seizures.  Path demonstrates islands of anaplasia c/w grade II/III. 04/05/19: Completes post-operative IMRT with Dr. Lisbeth Renshaw  Interval History:  Joshua [name] presents today for clinical follow up. No new or progressive changes today.  No seizures since prior visit 6 months ago, maintains good comliance with Vimpat and Lamictal.  Still no utilizing any ativan at this time.  Klonopin he has been dosing 0.'5mg'$  at night.  Remains physically active, engaged.  H+P (02/05/19) Patient presents to review clinical course and care for his meningioma.  Initially he presented in 2014 with headache syndrome, was found to have tumor on imaging which was resected by Dr. Vertell Limber.  The patient was lost to follow up for ~5 years until he developed seizures, described as "twitching of left leg spreading to arm" this past month.  He required loads of Keppra and Dilantin to break events.  MRI demonstrated significant regrowth of the mass, and repeat craniotomy was performed on 01/10/19.  Small amount of residual tumor was visible on post-operative MRI.  Since surgery he has continued to experience numbness and clumsiness of his left leg, requiring cane for ambulation.  He is still pending home physical therapy evaluation.  He has had "small" seizures, consisting of few seconds of shaking of left leg, occurring less than daily.  Continues on Keppra and Dilantin.     Medications: Current Outpatient Medications on File Prior to Visit  Medication Sig Dispense Refill   Accu-Chek Softclix Lancets lancets Use as instructed 100 each 12   acetaminophen (TYLENOL) 500 MG tablet Take 1,000 mg by mouth every 6 (six) hours as needed for moderate pain or headache.     acetaminophen-codeine (TYLENOL #3) 300-30 MG tablet Take 1 tablet by mouth at bedtime as needed for moderate pain. Do not drive while taking this medication (Patient not taking: Reported on 09/18/2022) 20 tablet 0   albuterol (VENTOLIN HFA) 108 (90 Base) MCG/ACT inhaler Inhale 2 puffs into the lungs every 6 (six) hours as needed for wheezing or shortness of breath. 8 g 2   amLODipine (NORVASC) 5 MG tablet TAKE 1 TABLET (5 MG TOTAL) BY MOUTH DAILY. TO LOWER BLOOD PRESSURE. STOP LOSARTAN (Patient taking differently: Take 5 mg by mouth daily.) 90 tablet 0   aspirin 81 MG chewable tablet Chew 81 mg by mouth daily.     atorvastatin (LIPITOR) 20 MG tablet TAKE 1 TABLET (20 MG TOTAL) BY MOUTH DAILY. 30 tablet 0   Blood Glucose Monitoring Suppl (ACCU-CHEK GUIDE ME) w/Device KIT Use to check blood sugar once daily. 1 kit 0   clonazePAM (KLONOPIN) 0.5 MG tablet Take 1 tablet (0.5 mg total) by mouth at bedtime. (Patient taking differently: Take 0.5 mg by mouth daily as needed for anxiety.) 60 tablet 2   diazepam (VALIUM) 2 MG tablet Take 1 tablet (2 mg total) by mouth 2 (two) times daily. (Patient not taking: Reported on 09/18/2022) 8 tablet 0   glucose blood (ACCU-CHEK GUIDE) test strip Use to check  blood sugar once daily. 100 each 6   ketoconazole (NIZORAL) 2 % cream APPLY 1 APPLICATION. TOPICALLY DAILY. (Patient taking differently: Apply 1 Application topically daily as needed for irritation.) 60 g 2   Lacosamide 100 MG TABS TAKE 1 TABLET (100 MG TOTAL) BY MOUTH IN THE MORNING AND AT BEDTIME. 60 tablet 2   lamoTRIgine (LAMICTAL) 200 MG tablet Take 200 mg by mouth 2 (two) times daily.     lidocaine (LIDODERM) 5 % Place  1 patch onto the skin daily. Remove & Discard patch within 12 hours or as directed by MD (Patient not taking: Reported on 06/14/2022) 14 patch 0   meclizine (ANTIVERT) 25 MG tablet Take 1 tablet (25 mg total) by mouth 3 (three) times daily as needed for dizziness. 30 tablet 0   metFORMIN (GLUCOPHAGE) 500 MG tablet Take 1 tablet (500 mg total) by mouth 2 (two) times daily. 180 tablet 1   methocarbamol (ROBAXIN) 750 MG tablet Take 750 mg by mouth daily as needed for muscle spasms.     omeprazole (PRILOSEC) 20 MG capsule Take 20 mg by mouth daily.     propranolol (INDERAL) 10 MG tablet Take 1 tablet (10 mg total) by mouth 3 (three) times daily as needed (palpitations). 60 tablet 11   tadalafil (CIALIS) 5 MG tablet Take 1 tablet (5 mg total) by mouth daily as needed for erectile dysfunction. 1 hour prior to sexual activity 30 tablet 11   tamsulosin (FLOMAX) 0.4 MG CAPS capsule Take 1 capsule (0.4 mg total) by mouth daily. (Patient taking differently: Take 0.4 mg by mouth at bedtime.) 90 capsule 3   VITAMIN A PO Take 1 capsule by mouth daily.     No current facility-administered medications on file prior to visit.    Allergies: No Known Allergies Past Medical History:  Past Medical History:  Diagnosis Date   Anxiety    Brain tumor ([place]) 02/20/2013   brain tumor removed in March 2014, Dr Donald Pore   Diffuse idiopathic skeletal hyperostosis 06/05/2013   Dizziness    Enlarged prostate    GERD (gastroesophageal reflux disease)    Headache(784.0)    scattered   High cholesterol    Hypertension    Malignant meningioma of meninges of brain ([place]) 03/07/2019   Meningioma ([place])    Meningioma, recurrent of brain ([place]) 01/31/2019   Neuropathy 12/05/2019   Pre-diabetes    Rotator cuff tear    right   Seizures ([place])    11/27/19 last sz 1 wk ago   Past Surgical History:  Past Surgical History:  Procedure Laterality Date   APPLICATION OF CRANIAL NAVIGATION N/A 01/10/2019   Procedure: APPLICATION OF  CRANIAL NAVIGATION;  Surgeon: Erline Levine, MD;  Location: [facility];  Service: Neurosurgery;  Laterality: N/A;   CATARACT EXTRACTION Right    CRANIOTOMY Right 11/06/2012   Procedure: CRANIOTOMY TUMOR EXCISION;  Surgeon: Erline Levine, MD;  Location: [facility];  Service: Neurosurgery;  Laterality: Right;  Right Parasagittal craniotomy for meningioma with Stealth   CRANIOTOMY Right 01/10/2019   Procedure: Right Parasagittal Craniotomy for Tumor;  Surgeon: Erline Levine, MD;  Location: [facility];  Service: Neurosurgery;  Laterality: Right;  Right parasagittal craniotomy for tumor   ESOPHAGOGASTRODUODENOSCOPY     JOINT REPLACEMENT Right 2018   right hip   REVERSE SHOULDER ARTHROPLASTY Right 01/08/2021   Procedure: RIGHT REVERSE SHOULDER ARTHROPLASTY;  Surgeon: Meredith Pel, MD;  Location: [facility];  Service: Orthopedics;  Laterality: Right;   right knee arthroscopy  SHOULDER ARTHROSCOPY Right 06/09/2020   Procedure: RIGHT SHOULDER ARTHROSCOPY AND DEBRIDEMENT;  Surgeon: Newt Minion, MD;  Location: [facility];  Service: Orthopedics;  Laterality: Right;   Social History:  Social History   Socioeconomic History   Marital status: Legally Separated    Spouse name: Not on file   Number of children: 2   Years of education: Not on file   Highest education level: Not on file  Occupational History   Not on file  Tobacco Use   Smoking status: Former    Years: 15.00    Types: Cigarettes    Quit date: 01/29/2019    Years since quitting: 3.6    Passive exposure: Past   Smokeless tobacco: Never   Tobacco comments:    weekend smoker  Vaping Use   Vaping Use: Some days   Substances: Nicotine, Flavoring  Substance and Sexual Activity   Alcohol use: Not Currently    Comment: social   Drug use: No   Sexual activity: Not Currently  Other Topics Concern   Not on file  Social History Narrative      Caffeine- soda occass   Social Determinants of Health   Financial Resource  Strain: Not on file  Food Insecurity: Not on file  Transportation Needs: No Transportation Needs (01/31/2019)   PRAPARE - Hydrologist (Medical): No    Lack of Transportation (Non-Medical): No  Physical Activity: Not on file  Stress: Not on file  Social Connections: Not on file  Intimate Partner Violence: Not on file   Family History:  Family History  Problem Relation Age of Onset   Hypertension Mother    Dementia Mother    Diabetes Mother    Hypertension Father    CAD Father    Diabetes Father    CAD Brother     Review of Systems: Constitutional: Denies fevers, chills or abnormal weight loss Eyes: Denies blurriness of vision Ears, nose, mouth, throat, and face: Denies mucositis or sore throat Respiratory: Denies cough, dyspnea or wheezes Cardiovascular: Denies palpitation, chest discomfort or lower extremity swelling Gastrointestinal:  Denies nausea, constipation, diarrhea GU: Denies dysuria or incontinence Skin: Denies abnormal skin rashes Neurological: Per HPI Musculoskeletal: Denies joint pain, back or neck discomfort. No decrease in ROM Behavioral/Psych: Denies anxiety, disturbance in thought content, and mood instability  Physical Exam: There were no vitals filed for this visit.  KPS: 80. General: Alert, cooperative, pleasant, in no acute distress Head: Craniotomy scar noted, dry and intact. EENT: No conjunctival injection or scleral icterus. Oral mucosa moist Lungs: Resp effort normal Cardiac: Regular rate and rhythm Abdomen: Soft, non-distended abdomen Skin: No rashes cyanosis or petechiae. Extremities: No clubbing or edema  Neurologic Exam: Mental Status: Awake, alert, attentive to examiner. Oriented to self and environment. Language is fluent with intact comprehension.  Cranial Nerves: Visual acuity is grossly normal. Visual fields are full. Extra-ocular movements intact. No ptosis. Face is symmetric, tongue midline. Motor: Tone  and bulk are normal.Power is impaired in left leg, 4+/5 distally. Reflexes are symmetric, no pathologic reflexes present. Impaired heel to shin left leg Sensory: Stocking sensory loss Gait: Cane assisted  Labs: I have reviewed the data as listed    Component Value Date/Time   NA 139 09/18/2022 1529   NA 144 04/15/2020 1515   K 4.8 09/18/2022 1529   CL 106 09/18/2022 1529   CO2 26 09/18/2022 1529   GLUCOSE 98 09/18/2022 1529   BUN 15  09/18/2022 1529   BUN 13 04/15/2020 1515   CREATININE 0.88 09/18/2022 1529   CALCIUM 9.1 09/18/2022 1529   PROT 7.8 09/18/2022 1529   PROT 7.0 04/15/2020 1515   ALBUMIN 4.4 09/18/2022 1529   ALBUMIN 4.7 04/15/2020 1515   AST 28 09/18/2022 1529   ALT 22 09/18/2022 1529   ALKPHOS 60 09/18/2022 1529   BILITOT 0.9 09/18/2022 1529   BILITOT <0.2 04/15/2020 1515   GFRNONAA >60 09/18/2022 1529   GFRAA >60 04/17/2020 1313   Lab Results  Component Value Date   WBC 6.4 09/18/2022   NEUTROABS 3.7 09/18/2022   HGB 15.4 09/18/2022   HCT 46.7 09/18/2022   MCV 95.1 09/18/2022   PLT 246 09/18/2022    Assessment/Plan 1. Meningioma, recurrent of brain ([place])  2. Seizures [facility])  Joshua [name] is clinically stable today.  No further seizures.   Will recommend continuing LMG '200mg'$  BID, Vimpat can be decreased to 100/50.Marland Kitchen   Klonopin can continue at 0.'5mg'$  HS as discussed, we provided a refill today.    He will continue to work on anxiety and recent stressors through his counselor.   We ask that Joshua [name] return to clinic in 6 months with MRI brain for evaluation, or sooner if needed.    We appreciate the opportunity to participate in the care of Joshua [name].    All questions were answered. The patient knows to call the clinic with any problems, questions or concerns. No barriers to learning were detected.    I have spent a total of 30 minutes of face-to-face and non-face-to-face time, excluding clinical staff time, preparing to see patient,  ordering tests and/or medications, counseling the patient, and care coordination    [name], MD Medical Director of Neuro-Oncology [facility] at [place] 09/19/22 9:35 AM

## 2022-09-19 NOTE — Telephone Encounter (Signed)
Per 3/4 LOS scheduled patient for follow up. Patient aware of date and time of appointment.

## 2022-09-21 ENCOUNTER — Other Ambulatory Visit: Payer: Self-pay | Admitting: Internal Medicine

## 2022-09-22 ENCOUNTER — Telehealth: Payer: Self-pay

## 2022-09-22 NOTE — Telephone Encounter (Signed)
        Patient  visited [facility] on 09/18/2022  for chest pain.   Telephone encounter attempt :  1st  A HIPAA compliant voice message was left requesting a return call.  Instructed patient to call back at [phone].   [place] Resource Care Guide   ??[email or web address]  ?? [phone]   Website: [email or web address]  [email or web address]

## 2022-09-27 ENCOUNTER — Telehealth: Payer: Self-pay

## 2022-09-27 NOTE — Telephone Encounter (Signed)
     Patient  visit on 09/18/2022  at [facility] was for chest pain.  Have you been able to follow up with your primary care physician? Cardiologist appointment on 09/29/22.  The patient was or was not able to obtain any needed medicine or equipment. No medication prescribed.  Are there diet recommendations that you are having difficulty following? No  Patient expresses understanding of discharge instructions and education provided has no other needs at this time. Yes   [facility] Resource Care Guide   ??[email or web address]  ?? [phone]   Website: [email or web address]  [email or web address]

## 2022-09-28 ENCOUNTER — Encounter: Payer: Self-pay | Admitting: Physician Assistant

## 2022-09-28 ENCOUNTER — Ambulatory Visit: Payer: 59 | Attending: Physician Assistant | Admitting: Physician Assistant

## 2022-09-28 VITALS — BP 112/62 | HR 86 | Ht 69.0 in | Wt 188.6 lb

## 2022-09-28 DIAGNOSIS — I10 Essential (primary) hypertension: Secondary | ICD-10-CM

## 2022-09-28 DIAGNOSIS — E7800 Pure hypercholesterolemia, unspecified: Secondary | ICD-10-CM | POA: Diagnosis not present

## 2022-09-28 DIAGNOSIS — I700 Atherosclerosis of aorta: Secondary | ICD-10-CM | POA: Diagnosis not present

## 2022-09-28 DIAGNOSIS — R002 Palpitations: Secondary | ICD-10-CM

## 2022-09-28 DIAGNOSIS — E785 Hyperlipidemia, unspecified: Secondary | ICD-10-CM | POA: Diagnosis not present

## 2022-09-28 DIAGNOSIS — R0789 Other chest pain: Secondary | ICD-10-CM

## 2022-09-28 NOTE — Progress Notes (Signed)
Office Visit    Patient Name: Joshua [name] Date of Encounter: 09/28/2022  PCP:  Pediatrics, [facility]  Cardiologist:  Fransico Him, MD  Advanced Practice Provider:  No care team member to display Electrophysiologist:  None   HPI    Joshua [name] is a 62 y.o. male with a past history of anxiety, GERD, enlarged prostate, neuropathy, hypertension, hyperlipidemia, malignant meningioma status postresection 2014 and 12/2018 s/p resection of her seizures, palpitations, family history of early CAD and seizures presents today for follow-up appointment.  Referred to cardiology by PCP for evaluation of aortic atherosclerosis.  Seen by Dr. Radford Pax 11/2019.  Reported occasional fluttering in his chest that occurs 1-2 times monthly and last for about 30 seconds then resolves.  Aortic atherosclerosis noted on chest CTA 02/2019, no mention of coronary artery calcification.  Strong family history of MIs in brothers and father is having MIs in early age.  Nuclear stress test 12/2019 with no evidence of ischemia, low risk.  Coronary calcium score of 12/2019.  Cardiac monitor 01/2020 unremarkable.  Was seen by Dr. Radford Pax 12/14/2021 at which time he reported a fluttering sensation from time to time and event monitor was repeated.  Cardiac monitor 02/19/2022 revealed predominant normal sinus rhythm with average heart rate of 76/min, rare PACs.  He was last seen 05/31/2022 and at that time reported palpitations times a week but did not last for very long.  Did not limit his activity.  States sometimes occasional cardiogenic last for few seconds.  He does feel MCA and does cardiovascular exercise for about 30 minutes and and other exercises as well.  Denies chest pain, shortness of breath, lower extremity edema, fatigue, melena, hematuria, hemoptysis, diaphoresis, weakness, presyncope, syncope, orthopnea, PND.  He wanted to ensure that his heart was healthy so  he does not have an expected cardiovascular event.  He was seen in the ED 09/18/2022 for a chief complaint of chest pain.  He also had associated palpitations shortness of breath.  He was seen in the ED 3 days prior for right hand and foot paresthesias.  Earlier that day he was in his normal state of the health.  Approximately 1 hour prior to arrival while driving he experienced substernal sharp chest pain.  Chest pain has been intermittent since that time.  When it occurred it was 6 out of 10 in severity.  Experiencing associated shortness of breath.  Denied nausea or diaphoresis.  He was pain-free in the ED.  Denied tobacco use but does vape.  Family history of early ACS.  Undergone prior CTA of the chest showing aortic atherosclerosis disease.  Underwent stress test 3 years prior.  Palpitations have been attributed to PACs based on cardiac monitoring.  Normal troponins x 2.  No acute findings on EKG.    Today, he states that he was recently in the ED with a pin in his chest feeling, stinging.  She stated it felt like his necklace got caught in his chest.  No recurrent episodes.  It did start to the ED.  Strong family history of CAD in his father and older brothers.  He does have occasional palpitations and takes propranolol when needed.  They do not happen often.  Palpitations tend to occur when emotions are high.  He states that he has been doing some walking.  He also has been doing water therapy at the [facility] but has stopped doing this for the time  being until he gets his heart further evaluated.   Reports no shortness of breath nor dyspnea on exertion. Reports no chest pain, pressure, or tightness. No edema, orthopnea, PND.   Past Medical History    Past Medical History:  Diagnosis Date   Anxiety    Brain tumor ([place]) 02/20/2013   brain tumor removed in March 2014, Dr Donald Pore   Diffuse idiopathic skeletal hyperostosis 06/05/2013   Dizziness    Enlarged prostate    GERD (gastroesophageal reflux  disease)    Headache(784.0)    scattered   High cholesterol    Hypertension    Malignant meningioma of meninges of brain ([place]) 03/07/2019   Meningioma ([place])    Meningioma, recurrent of brain ([place]) 01/31/2019   Neuropathy 12/05/2019   Pre-diabetes    Rotator cuff tear    right   Seizures ([place])    11/27/19 last sz 1 wk ago   Past Surgical History:  Procedure Laterality Date   APPLICATION OF CRANIAL NAVIGATION N/A 01/10/2019   Procedure: APPLICATION OF CRANIAL NAVIGATION;  Surgeon: Erline Levine, MD;  Location: [place];  Service: Neurosurgery;  Laterality: N/A;   CATARACT EXTRACTION Right    CRANIOTOMY Right 11/06/2012   Procedure: CRANIOTOMY TUMOR EXCISION;  Surgeon: Erline Levine, MD;  Location: [facility] NEUR[facility];  Service: Neurosurgery;  Laterality: Right;  Right Parasagittal craniotomy for meningioma with Stealth   CRANIOTOMY Right 01/10/2019   Procedure: Right Parasagittal Craniotomy for Tumor;  Surgeon: Erline Levine, MD;  Location: [facility];  Service: Neurosurgery;  Laterality: Right;  Right parasagittal craniotomy for tumor   ESOPHAGOGASTRODUODENOSCOPY     JOINT REPLACEMENT Right 2018   right hip   REVERSE SHOULDER ARTHROPLASTY Right 01/08/2021   Procedure: RIGHT REVERSE SHOULDER ARTHROPLASTY;  Surgeon: Meredith Pel, MD;  Location: [place];  Service: Orthopedics;  Laterality: Right;   right knee arthroscopy     SHOULDER ARTHROSCOPY Right 06/09/2020   Procedure: RIGHT SHOULDER ARTHROSCOPY AND DEBRIDEMENT;  Surgeon: Newt Minion, MD;  Location: [facility];  Service: Orthopedics;  Laterality: Right;    Allergies  No Known Allergies  EKGs/Labs/Other Studies Reviewed:   The following studies were reviewed today: Cardiac Monitor 02/19/22    Predominat rhythm was normal sinus rhythm with average heart rate 72bpm and ranged from 47 to 122bpm.   Rare PAC   Cardiac Monitor 01/22/2020   Sinus bradycardia to sinus tachycardia. No atrial fibrillation, no arrhythmias.    Normal 30 day event monitor with no evidence for atrial fibrillation.   CT Cardiac Score 12/18/2019   Non-cardiac: See separate report from [facility].   Ascending Aorta: Normal caliber.  No calcifications.   Pericardium: Normal   Coronary arteries: Normal coronary origins.   IMPRESSION: Coronary calcium score of 0. This was 0 percentile for age and sex matched control.   Nuclear Stress Test 12/18/2019   Nuclear stress EF: 60%. There was no ST segment deviation noted during stress. No T wave inversion was noted during stress. The study is normal. This is a low risk study. The left ventricular ejection fraction is normal (55-65%).    EKG:  EKG is not ordered today.  RBBB on last EKG which is old.  Recent Labs: 09/18/2022: ALT 22; BUN 15; Creatinine, Ser 0.88; Hemoglobin 15.4; Magnesium 2.1; Platelets 246; Potassium 4.8; Sodium 139  Recent Lipid Panel    Component Value Date/Time   CHOL 116 12/27/2021 1218   TRIG 51 12/27/2021 1218   HDL 64 12/27/2021 1218  CHOLHDL 1.8 12/27/2021 1218   LDLCALC 40 12/27/2021 1218    Home Medications   Current Meds  Medication Sig   Accu-Chek Softclix Lancets lancets Use as instructed   acetaminophen (TYLENOL) 500 MG tablet Take 1,000 mg by mouth every 6 (six) hours as needed for moderate pain or headache.   albuterol (VENTOLIN HFA) 108 (90 Base) MCG/ACT inhaler Inhale 2 puffs into the lungs every 6 (six) hours as needed for wheezing or shortness of breath.   amLODipine (NORVASC) 5 MG tablet TAKE 1 TABLET (5 MG TOTAL) BY MOUTH DAILY. TO LOWER BLOOD PRESSURE. STOP LOSARTAN (Patient taking differently: Take 5 mg by mouth daily.)   aspirin 81 MG chewable tablet Chew 81 mg by mouth daily.   atorvastatin (LIPITOR) 20 MG tablet TAKE 1 TABLET (20 MG TOTAL) BY MOUTH DAILY.   Blood Glucose Monitoring Suppl (ACCU-CHEK GUIDE ME) w/Device KIT Use to check blood sugar once daily.   clonazePAM (KLONOPIN) 0.5 MG tablet Take 1 tablet (0.5 mg  total) by mouth at bedtime.   diazepam (VALIUM) 2 MG tablet Take 1 tablet (2 mg total) by mouth 2 (two) times daily.   glucose blood (ACCU-CHEK GUIDE) test strip Use to check blood sugar once daily.   ketoconazole (NIZORAL) 2 % cream APPLY 1 APPLICATION. TOPICALLY DAILY. (Patient taking differently: Apply 1 Application topically daily as needed for irritation.)   Lacosamide 100 MG TABS Take 1 tablet (100 mg total) by mouth in the morning and at bedtime.   lamoTRIgine (LAMICTAL) 200 MG tablet TAKE 1 TABLET (200 MG TOTAL) BY MOUTH 2 (TWO) TIMES DAILY.   lidocaine (LIDODERM) 5 % Place 1 patch onto the skin daily. Remove & Discard patch within 12 hours or as directed by MD   meclizine (ANTIVERT) 25 MG tablet Take 1 tablet (25 mg total) by mouth 3 (three) times daily as needed for dizziness.   metFORMIN (GLUCOPHAGE) 500 MG tablet Take 1 tablet (500 mg total) by mouth 2 (two) times daily.   methocarbamol (ROBAXIN) 750 MG tablet Take 750 mg by mouth daily as needed for muscle spasms.   omeprazole (PRILOSEC) 20 MG capsule Take 20 mg by mouth daily.   propranolol (INDERAL) 10 MG tablet Take 1 tablet (10 mg total) by mouth 3 (three) times daily as needed (palpitations).   tadalafil (CIALIS) 5 MG tablet Take 1 tablet (5 mg total) by mouth daily as needed for erectile dysfunction. 1 hour prior to sexual activity   tamsulosin (FLOMAX) 0.4 MG CAPS capsule Take 1 capsule (0.4 mg total) by mouth daily. (Patient taking differently: Take 0.4 mg by mouth at bedtime.)   VITAMIN A PO Take 1 capsule by mouth daily.     Review of Systems      All other systems reviewed and are otherwise negative except as noted above.  Physical Exam    VS:  BP 112/62   Pulse 86   Ht '5\' 9"'$  (1.753 m)   Wt 188 lb 9.6 oz (85.5 kg)   SpO2 98%   BMI 27.85 kg/m  , BMI Body mass index is 27.85 kg/m.  Wt Readings from Last 3 Encounters:  09/28/22 188 lb 9.6 oz (85.5 kg)  09/19/22 188 lb 11.2 oz (85.6 kg)  09/18/22 180 lb (81.6  kg)     GEN: Well nourished, well developed, in no acute distress. HEENT: normal. Neck: Supple, no JVD, carotid bruits, or masses. Cardiac: RRR, no murmurs, rubs, or gallops. No clubbing, cyanosis, edema.  Radials/PT 2+ and  equal bilaterally.  Respiratory:  Respirations regular and unlabored, clear to auscultation bilaterally. GI: Soft, nontender, nondistended. MS: No deformity or atrophy. Skin: Warm and dry, no rash. Neuro:  Strength and sensation are intact. Psych: Normal affect.  Assessment & Plan    Chest pain -ordered echo today -recent testing reviewed with the patient  -troponin and EKG neg in the hospital -Continue amlodipine 5 mg daily, aspirin 81 mg daily, Lipitor 20 mg daily, propranolol as needed  Aortic atherosclerosis -Most recent lipid panel showed LDL 40, HDL 64, total cholesterol 116, triglycerides 51(12/2021)   RBBB (old)  -stable at this time  Palpitations -Well-controlled at this time and not occurring frequently -continue PRN labetalol  Hypertension -Well-controlled today, blood pressure 112/62, heart rate 86 bpm -Continue current medication -continue to keep track at home  Hyperlipidemia -continue Lipitor -due for lipid panel 12/2022        Disposition: Follow up 3-4 months with Fransico Him, MD or APP.  Signed, Elgie Collard, PA-C 09/28/2022, 2:51 PM [facility]

## 2022-09-28 NOTE — Patient Instructions (Signed)
Medication Instructions:  Your physician recommends that you continue on your current medications as directed. Please refer to the Current Medication list given to you today.  *If you need a refill on your cardiac medications before your next appointment, please call your pharmacy*   Lab Work: Fasting lipids and lft's in June If you have labs (blood work) drawn today and your tests are completely normal, you will receive your results only by: [place] (if you have MyChart) OR A paper copy in the mail If you have any lab test that is abnormal or we need to change your treatment, we will call you to review the results.   Testing/Procedures: Your physician has requested that you have an echocardiogram. Echocardiography is a painless test that uses sound waves to create images of your heart. It provides your doctor with information about the size and shape of your heart and how well your heart's chambers and valves are working. This procedure takes approximately one hour. There are no restrictions for this procedure. Please do NOT wear cologne, perfume, aftershave, or lotions (deodorant is allowed). Please arrive 15 minutes prior to your appointment time.    Follow-Up: At [facility], you and your health needs are our priority.  As part of our continuing mission to provide you with exceptional heart care, we have created designated Provider Care Teams.  These Care Teams include your primary Cardiologist (physician) and Advanced Practice Providers (APPs -  Physician Assistants and Nurse Practitioners) who all work together to provide you with the care you need, when you need it.  Your next appointment:   3-4 month(s)  Provider:   Fransico Him, MD     Low-Sodium Eating Plan Sodium, which is an element that makes up salt, helps you maintain a healthy balance of fluids in your body. Too much sodium can increase your blood pressure and cause fluid and waste to be held in your  body. Your health care provider or dietitian may recommend following this plan if you have high blood pressure (hypertension), kidney disease, liver disease, or heart failure. Eating less sodium can help lower your blood pressure, reduce swelling, and protect your heart, liver, and kidneys. What are tips for following this plan? Reading food labels The Nutrition Facts label lists the amount of sodium in one serving of the food. If you eat more than one serving, you must multiply the listed amount of sodium by the number of servings. Choose foods with less than 140 mg of sodium per serving. Avoid foods with 300 mg of sodium or more per serving. Shopping  Look for lower-sodium products, often labeled as "low-sodium" or "no salt added." Always check the sodium content, even if foods are labeled as "unsalted" or "no salt added." Buy fresh foods. Avoid canned foods and pre-made or frozen meals. Avoid canned, cured, or processed meats. Buy breads that have less than 80 mg of sodium per slice. Cooking  Eat more home-cooked food and less restaurant, buffet, and fast food. Avoid adding salt when cooking. Use salt-free seasonings or herbs instead of table salt or sea salt. Check with your health care provider or pharmacist before using salt substitutes. Cook with plant-based oils, such as canola, sunflower, or olive oil. Meal planning When eating at a restaurant, ask that your food be prepared with less salt or no salt, if possible. Avoid dishes labeled as brined, pickled, cured, smoked, or made with soy sauce, miso, or teriyaki sauce. Avoid foods that contain MSG (monosodium glutamate).  MSG is sometimes added to [place] food, bouillon, and some canned foods. Make meals that can be grilled, baked, poached, roasted, or steamed. These are generally made with less sodium. General information Most people on this plan should limit their sodium intake to 1,500-2,000 mg (milligrams) of sodium each day. What  foods should I eat? Fruits Fresh, frozen, or canned fruit. Fruit juice. Vegetables Fresh or frozen vegetables. "No salt added" canned vegetables. "No salt added" tomato sauce and paste. Low-sodium or reduced-sodium tomato and vegetable juice. Grains Low-sodium cereals, including oats, puffed wheat and rice, and shredded wheat. Low-sodium crackers. Unsalted rice. Unsalted pasta. Low-sodium bread. Whole-grain breads and whole-grain pasta. Meats and other proteins Fresh or frozen (no salt added) meat, poultry, seafood, and fish. Low-sodium canned tuna and salmon. Unsalted nuts. Dried peas, beans, and lentils without added salt. Unsalted canned beans. Eggs. Unsalted nut butters. Dairy Milk. Soy milk. Cheese that is naturally low in sodium, such as ricotta cheese, fresh mozzarella, or Swiss cheese. Low-sodium or reduced-sodium cheese. Cream cheese. Yogurt. Seasonings and condiments Fresh and dried herbs and spices. Salt-free seasonings. Low-sodium mustard and ketchup. Sodium-free salad dressing. Sodium-free light mayonnaise. Fresh or refrigerated horseradish. Lemon juice. Vinegar. Other foods Homemade, reduced-sodium, or low-sodium soups. Unsalted popcorn and pretzels. Low-salt or salt-free chips. The items listed above may not be a complete list of foods and beverages you can eat. Contact a dietitian for more information. What foods should I avoid? Vegetables Sauerkraut, pickled vegetables, and relishes. Olives. [place] fries. Onion rings. Regular canned vegetables (not low-sodium or reduced-sodium). Regular canned tomato sauce and paste (not low-sodium or reduced-sodium). Regular tomato and vegetable juice (not low-sodium or reduced-sodium). Frozen vegetables in sauces. Grains Instant hot cereals. Bread stuffing, pancake, and biscuit mixes. Croutons. Seasoned rice or pasta mixes. Noodle soup cups. Boxed or frozen macaroni and cheese. Regular salted crackers. Self-rising flour. Meats and other  proteins Meat or fish that is salted, canned, smoked, spiced, or pickled. Precooked or cured meat, such as sausages or meat loaves. [place] Salines. Ham. Pepperoni. Hot dogs. Corned beef. Chipped beef. Salt pork. Jerky. Pickled herring. Anchovies and sardines. Regular canned tuna. Salted nuts. Dairy Processed cheese and cheese spreads. Hard cheeses. Cheese curds. Blue cheese. Feta cheese. String cheese. Regular cottage cheese. Buttermilk. Canned milk. Fats and oils Salted butter. Regular margarine. Ghee. Bacon fat. Seasonings and condiments Onion salt, garlic salt, seasoned salt, table salt, and sea salt. Canned and packaged gravies. Worcestershire sauce. Tartar sauce. Barbecue sauce. Teriyaki sauce. Soy sauce, including reduced-sodium. Steak sauce. Fish sauce. Oyster sauce. Cocktail sauce. Horseradish that you find on the shelf. Regular ketchup and mustard. Meat flavorings and tenderizers. Bouillon cubes. Hot sauce. Pre-made or packaged marinades. Pre-made or packaged taco seasonings. Relishes. Regular salad dressings. Salsa. Other foods Salted popcorn and pretzels. Corn chips and puffs. Potato and tortilla chips. Canned or dried soups. Pizza. Frozen entrees and pot pies. The items listed above may not be a complete list of foods and beverages you should avoid. Contact a dietitian for more information. Summary Eating less sodium can help lower your blood pressure, reduce swelling, and protect your heart, liver, and kidneys. Most people on this plan should limit their sodium intake to 1,500-2,000 mg (milligrams) of sodium each day. Canned, boxed, and frozen foods are high in sodium. Restaurant foods, fast foods, and pizza are also very high in sodium. You also get sodium by adding salt to food. Try to cook at home, eat more fresh fruits and vegetables, and eat less fast food  and canned, processed, or prepared foods. This information is not intended to replace advice given to you by your health care provider.  Make sure you discuss any questions you have with your health care provider. Document Revised: 06/10/2019 Document Reviewed: 06/05/2019 Elsevier Patient Education  [place] Many factors influence your heart health, including eating and exercise habits. Heart health is also called coronary health. Coronary risk increases with abnormal blood fat (lipid) levels. A heart-healthy eating plan includes limiting unhealthy fats, increasing healthy fats, limiting salt (sodium) intake, and making other diet and lifestyle changes. What is my plan? Your health care provider may recommend that: You limit your fat intake to _________% or less of your total calories each day. You limit your saturated fat intake to _________% or less of your total calories each day. You limit the amount of cholesterol in your diet to less than _________ mg per day. You limit the amount of sodium in your diet to less than _________ mg per day. What are tips for following this plan? Cooking Cook foods using methods other than frying. Baking, boiling, grilling, and broiling are all good options. Other ways to reduce fat include: Removing the skin from poultry. Removing all visible fats from meats. Steaming vegetables in water or broth. Meal planning  At meals, imagine dividing your plate into fourths: Fill one-half of your plate with vegetables and green salads. Fill one-fourth of your plate with whole grains. Fill one-fourth of your plate with lean protein foods. Eat 2-4 cups of vegetables per day. One cup of vegetables equals 1 cup (91 g) broccoli or cauliflower florets, 2 medium carrots, 1 large bell pepper, 1 large sweet potato, 1 large tomato, 1 medium white potato, 2 cups (150 g) raw leafy greens. Eat 1-2 cups of fruit per day. One cup of fruit equals 1 small apple, 1 large banana, 1 cup (237 g) mixed fruit, 1 large orange,  cup (82 g) dried fruit, 1 cup (240 mL) 100% fruit  juice. Eat more foods that contain soluble fiber. Examples include apples, broccoli, carrots, beans, peas, and barley. Aim to get 25-30 g of fiber per day. Increase your consumption of legumes, nuts, and seeds to 4-5 servings per week. One serving of dried beans or legumes equals  cup (90 g) cooked, 1 serving of nuts is  oz (12 almonds, 24 pistachios, or 7 walnut halves), and 1 serving of seeds equals  oz (8 g). Fats Choose healthy fats more often. Choose monounsaturated and polyunsaturated fats, such as olive and canola oils, avocado oil, flaxseeds, walnuts, almonds, and seeds. Eat more omega-3 fats. Choose salmon, mackerel, sardines, tuna, flaxseed oil, and ground flaxseeds. Aim to eat fish at least 2 times each week. Check food labels carefully to identify foods with trans fats or high amounts of saturated fat. Limit saturated fats. These are found in animal products, such as meats, butter, and cream. Plant sources of saturated fats include palm oil, palm kernel oil, and coconut oil. Avoid foods with partially hydrogenated oils in them. These contain trans fats. Examples are stick margarine, some tub margarines, cookies, crackers, and other baked goods. Avoid fried foods. General information Eat more home-cooked food and less restaurant, buffet, and fast food. Limit or avoid alcohol. Limit foods that are high in added sugar and simple starches such as foods made using white refined flour (white breads, pastries, sweets). Lose weight if you are overweight. Losing just 5-10% of your body weight can help your  overall health and prevent diseases such as diabetes and heart disease. Monitor your sodium intake, especially if you have high blood pressure. Talk with your health care provider about your sodium intake. Try to incorporate more vegetarian meals weekly. What foods should I eat? Fruits All fresh, canned (in natural juice), or frozen fruits. Vegetables Fresh or frozen vegetables (raw,  steamed, roasted, or grilled). Green salads. Grains Most grains. Choose whole wheat and whole grains most of the time. Rice and pasta, including brown rice and pastas made with whole wheat. Meats and other proteins Lean, well-trimmed beef, veal, pork, and lamb. Chicken and [place] without skin. All fish and shellfish. Wild duck, rabbit, pheasant, and venison. Egg whites or low-cholesterol egg substitutes. Dried beans, peas, lentils, and tofu. Seeds and most nuts. Dairy Low-fat or nonfat cheeses, including ricotta and mozzarella. Skim or 1% milk (liquid, powdered, or evaporated). Buttermilk made with low-fat milk. Nonfat or low-fat yogurt. Fats and oils Non-hydrogenated (trans-free) margarines. Vegetable oils, including soybean, sesame, sunflower, olive, avocado, peanut, safflower, corn, canola, and cottonseed. Salad dressings or mayonnaise made with a vegetable oil. Beverages Water (mineral or sparkling). Coffee and tea. Unsweetened ice tea. Diet beverages. Sweets and desserts Sherbet, gelatin, and fruit ice. Small amounts of dark chocolate. Limit all sweets and desserts. Seasonings and condiments All seasonings and condiments. The items listed above may not be a complete list of foods and beverages you can eat. Contact a dietitian for more options. What foods should I avoid? Fruits Canned fruit in heavy syrup. Fruit in cream or butter sauce. Fried fruit. Limit coconut. Vegetables Vegetables cooked in cheese, cream, or butter sauce. Fried vegetables. Grains Breads made with saturated or trans fats, oils, or whole milk. Croissants. Sweet rolls. Donuts. High-fat crackers, such as cheese crackers and chips. Meats and other proteins Fatty meats, such as hot dogs, ribs, sausage, bacon, rib-eye roast or steak. High-fat deli meats, such as salami and bologna. Caviar. Domestic duck and goose. Organ meats, such as liver. Dairy Cream, sour cream, cream cheese, and creamed cottage cheese. Whole-milk  cheeses. Whole or 2% milk (liquid, evaporated, or condensed). Whole buttermilk. Cream sauce or high-fat cheese sauce. Whole-milk yogurt. Fats and oils Meat fat, or shortening. Cocoa butter, hydrogenated oils, palm oil, coconut oil, palm kernel oil. Solid fats and shortenings, including bacon fat, salt pork, lard, and butter. Nondairy cream substitutes. Salad dressings with cheese or sour cream. Beverages Regular sodas and any drinks with added sugar. Sweets and desserts Frosting. Pudding. Cookies. Cakes. Pies. Milk chocolate or white chocolate. Buttered syrups. Full-fat ice cream or ice cream drinks. The items listed above may not be a complete list of foods and beverages to avoid. Contact a dietitian for more information. Summary Heart-healthy meal planning includes limiting unhealthy fats, increasing healthy fats, limiting salt (sodium) intake and making other diet and lifestyle changes. Lose weight if you are overweight. Losing just 5-10% of your body weight can help your overall health and prevent diseases such as diabetes and heart disease. Focus on eating a balance of foods, including fruits and vegetables, low-fat or nonfat dairy, lean protein, nuts and legumes, whole grains, and heart-healthy oils and fats. This information is not intended to replace advice given to you by your health care provider. Make sure you discuss any questions you have with your health care provider. Document Revised: 08/09/2021 Document Reviewed: 08/09/2021 Elsevier Patient Education  [place].

## 2022-10-02 ENCOUNTER — Other Ambulatory Visit: Payer: Self-pay

## 2022-10-02 ENCOUNTER — Encounter (HOSPITAL_BASED_OUTPATIENT_CLINIC_OR_DEPARTMENT_OTHER): Payer: Self-pay

## 2022-10-02 ENCOUNTER — Emergency Department (HOSPITAL_BASED_OUTPATIENT_CLINIC_OR_DEPARTMENT_OTHER): Payer: 59

## 2022-10-02 ENCOUNTER — Emergency Department (HOSPITAL_BASED_OUTPATIENT_CLINIC_OR_DEPARTMENT_OTHER)
Admission: EM | Admit: 2022-10-02 | Discharge: 2022-10-02 | Disposition: A | Discharge status: Home / Self Care | Payer: 59 | Attending: Emergency Medicine | Admitting: Emergency Medicine

## 2022-10-02 DIAGNOSIS — W228XXA Striking against or struck by other objects, initial encounter: Secondary | ICD-10-CM | POA: Diagnosis not present

## 2022-10-02 DIAGNOSIS — R519 Headache, unspecified: Secondary | ICD-10-CM | POA: Diagnosis not present

## 2022-10-02 DIAGNOSIS — Z79899 Other long term (current) drug therapy: Secondary | ICD-10-CM | POA: Insufficient documentation

## 2022-10-02 DIAGNOSIS — Z7982 Long term (current) use of aspirin: Secondary | ICD-10-CM | POA: Diagnosis not present

## 2022-10-02 DIAGNOSIS — R6884 Jaw pain: Secondary | ICD-10-CM | POA: Insufficient documentation

## 2022-10-02 DIAGNOSIS — Z7984 Long term (current) use of oral hypoglycemic drugs: Secondary | ICD-10-CM | POA: Insufficient documentation
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
## 2022-10-02 NOTE — Discharge Instructions (Signed)
Your scan today reassuringly normal, follow-up with your primary as needed.  Take Tylenol and Motrin as needed for pain.  Return to the ED for new or concerning symptoms.
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
## 2022-10-03 ENCOUNTER — Telehealth: Payer: Self-pay | Admitting: Cardiology

## 2022-10-03 NOTE — Telephone Encounter (Signed)
Patient is requesting call back to go over future appts and to get clarification as to when he should be doing labs. He seemed confused that it wasn't in a sooner time frame. Patient stated he did not want me to change anything in regards to his schedule until he talked to a nurse. Requesting return call.

## 2022-10-03 NOTE — Telephone Encounter (Signed)
Reviewed appointments with patient, 10/05/22 appt canceled as he was just seen by T. Conte on 09/28/22 and all follow up appts had been scheduled. Reviewed future appts for echo, labs and f/u w/ Dr/ Radford Pax. Patient verbalizes understanding.

## 2022-10-04 ENCOUNTER — Other Ambulatory Visit: Payer: Self-pay | Admitting: Radiation Therapy

## 2022-10-05 ENCOUNTER — Ambulatory Visit: Payer: 59 | Admitting: Nurse Practitioner
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
## 2022-10-18 ENCOUNTER — Ambulatory Visit: Payer: 59 | Admitting: Physician Assistant

## 2022-10-18 DIAGNOSIS — R0789 Other chest pain: Secondary | ICD-10-CM

## 2022-10-18 DIAGNOSIS — I4510 Unspecified right bundle-branch block: Secondary | ICD-10-CM

## 2022-10-18 DIAGNOSIS — I10 Essential (primary) hypertension: Secondary | ICD-10-CM

## 2022-10-18 DIAGNOSIS — E7800 Pure hypercholesterolemia, unspecified: Secondary | ICD-10-CM

## 2022-10-18 DIAGNOSIS — I700 Atherosclerosis of aorta: Secondary | ICD-10-CM

## 2022-10-18 DIAGNOSIS — R002 Palpitations: Secondary | ICD-10-CM

## 2022-10-20 ENCOUNTER — Ambulatory Visit: Payer: 59 | Admitting: Urology

## 2022-10-20 DIAGNOSIS — I700 Atherosclerosis of aorta: Secondary | ICD-10-CM | POA: Insufficient documentation

## 2022-10-20 NOTE — Progress Notes (Signed)
Duplicate appointment. No new concerns. Appointment canceled. Tereso Newcomer, PA-C

## 2022-10-21 ENCOUNTER — Encounter: Payer: Self-pay | Admitting: Physician Assistant

## 2022-10-21 ENCOUNTER — Ambulatory Visit: Payer: 59 | Admitting: Physician Assistant

## 2022-10-21 ENCOUNTER — Encounter: Payer: Self-pay | Admitting: Urology

## 2022-10-21 ENCOUNTER — Ambulatory Visit: Payer: 59 | Admitting: Surgical

## 2022-10-21 VITALS — BP 116/70 | HR 60 | Ht 69.0 in | Wt 189.6 lb

## 2022-10-21 DIAGNOSIS — I700 Atherosclerosis of aorta: Secondary | ICD-10-CM

## 2022-10-21 NOTE — Progress Notes (Signed)
This encounter was created in error - please disregard.

## 2022-10-25 ENCOUNTER — Ambulatory Visit (HOSPITAL_COMMUNITY): Payer: 59 | Attending: Physician Assistant

## 2022-10-31 ENCOUNTER — Ambulatory Visit: Payer: 59 | Admitting: Surgical

## 2022-11-02 ENCOUNTER — Encounter (HOSPITAL_COMMUNITY): Payer: Self-pay | Admitting: Physician Assistant

## 2022-11-02 ENCOUNTER — Ambulatory Visit (HOSPITAL_COMMUNITY): Payer: 59 | Attending: Physician Assistant

## 2022-11-07 ENCOUNTER — Ambulatory Visit (INDEPENDENT_AMBULATORY_CARE_PROVIDER_SITE_OTHER): Payer: 59 | Admitting: Surgical

## 2022-11-07 ENCOUNTER — Other Ambulatory Visit: Payer: Self-pay

## 2022-11-07 DIAGNOSIS — M19012 Primary osteoarthritis, left shoulder: Secondary | ICD-10-CM | POA: Diagnosis not present

## 2022-11-07 DIAGNOSIS — E118 Type 2 diabetes mellitus with unspecified complications: Secondary | ICD-10-CM | POA: Diagnosis not present

## 2022-11-07 DIAGNOSIS — R29898 Other symptoms and signs involving the musculoskeletal system: Secondary | ICD-10-CM | POA: Diagnosis not present

## 2022-11-07 DIAGNOSIS — I10 Essential (primary) hypertension: Secondary | ICD-10-CM | POA: Diagnosis not present

## 2022-11-07 DIAGNOSIS — M79675 Pain in left toe(s): Secondary | ICD-10-CM | POA: Diagnosis not present

## 2022-11-08 ENCOUNTER — Encounter: Payer: Self-pay | Admitting: Surgical

## 2022-11-08 MED ORDER — LIDOCAINE HCL 1 % IJ SOLN
5.0000 mL | INTRAMUSCULAR | Status: AC | PRN
Start: 2022-11-07 — End: 2022-11-07
  Administered 2022-11-07: 5 mL

## 2022-11-08 MED ORDER — BUPIVACAINE HCL 0.5 % IJ SOLN
9.0000 mL | INTRAMUSCULAR | Status: AC | PRN
Start: 2022-11-07 — End: 2022-11-07
  Administered 2022-11-07: 9 mL via INTRA_ARTICULAR

## 2022-11-08 MED ORDER — METHYLPREDNISOLONE ACETATE 40 MG/ML IJ SUSP
40.0000 mg | INTRAMUSCULAR | Status: AC | PRN
Start: 2022-11-07 — End: 2022-11-07
  Administered 2022-11-07: 40 mg via INTRA_ARTICULAR

## 2022-11-08 NOTE — Progress Notes (Signed)
Follow-up Office Visit Note   Patient: Joshua [name]           Date of Birth: 06/23/1961           MRN: [id number] Visit Date: 11/07/2022 Requested by: Pediatrics, [facility] [address] [place],  [place] [address] PCP: Pediatrics, [facility]  Subjective: Chief Complaint  Patient presents with   Left Shoulder - Pain    HPI: Joshua [name] is a 62 y.o. male who returns to the office for follow-up visit.    Plan at last visit was: Patient returns for left glenohumeral injection.  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  Tolerated injection well.  Will see how this does.  Recommended he reach out to the office after 1 to 2 weeks to let us know how his shoulder is doing.  If he has great relief but it is short-lived, could consider MRI of the left shoulder for further evaluation of the extent of arthritis in the shoulder and if there is any rotator cuff damage.   Since then, patient notes left shoulder glenohumeral injection on 08/01/2022 provided 70% relief of his shoulder pain for about 3 months.  He states that pain has come back over the last week or 2.  Interferes with sleeping at night and with fishing.  He would like to repeat injection.              ROS: All systems reviewed are negative as they relate to the chief complaint within the history of present illness.  Patient denies fevers or chills.  Assessment & Plan: Visit Diagnoses:  1. Glenohumeral arthritis, left     Plan: Joshua [name] is a 62 y.o. male who returns to the office for follow-up visit for left shoulder pain.  Plan from last visit was noted above in HPI.  They now return with return of left shoulder pain following glenohumeral injection.  Glenohumeral injection provided good relief for him for about 3 months.  He would like to repeat this today.  However, his radiographs show no  evidence of osteoarthritis and we do not have a great answer for why he is having this shoulder pain at this time.  Okay to proceed with glenohumeral injection today but we will also get him set up for MRI arthrogram of the left shoulder to evaluate for occult arthritis and rotator cuff pathology.  Likely has some degree of arthritis based on the stiffness he has on exam and the fact that he had good relief from glenohumeral injection.  Follow-up after MRI to review results.  Follow-Up Instructions: No follow-ups on file.   Orders:  Orders Placed This Encounter  Procedures   US Guided Needle Placement - No Linked Charges   MR Shoulder Left w/ contrast   Arthrogram   No orders of the defined types were placed in this encounter.     Procedures: Large Joint Inj: L glenohumeral on 11/07/2022 10:44 AM Indications: diagnostic evaluation and pain Details: 22 G 1.5 in and 3.5 in needle, ultrasound-guided posterior approach  Arthrogram: No  Medications: 9 mL bupivacaine 0.5 %; 40 mg methylPREDNISolone acetate 40 MG/ML; 5 mL lidocaine 1 % Outcome: tolerated well, no immediate complications Procedure, treatment alternatives, risks and benefits explained, specific risks discussed. Consent was given by the patient. Immediately prior to procedure a time out was called to verify  the correct patient, procedure, equipment, support staff and site/side marked as required. Patient was prepped and draped in the usual sterile fashion.       Clinical Data: No additional findings.  Objective: Vital Signs: There were no vitals taken for this visit.  Physical Exam:  Constitutional: Patient appears well-developed HEENT:  Head: Normocephalic Eyes:EOM are normal Neck: Normal range of motion Cardiovascular: Normal rate Pulmonary/chest: Effort normal Neurologic: Patient is alert Skin: Skin is warm Psychiatric: Patient has normal mood and affect  Ortho Exam: Ortho exam demonstrates left shoulder with  35 degrees external rotation, 85 degrees abduction, 120 degrees forward elevation passively and actively.  He has 5 -/5 infraspinatus strength with 5/5 supraspinatus and subscapularis strength.  Axillary nerve intact with deltoid firing.  He has tenderness over the bicipital groove.  No tenderness of the AC joint.  No cellulitis or skin changes noted.  Specialty Comments:  MRI CERVICAL SPINE WITHOUT AND WITH CONTRAST   TECHNIQUE: Multiplanar and multiecho pulse sequences of the cervical spine, to include the craniocervical junction and cervicothoracic junction, were obtained without and with intravenous contrast.   CONTRAST:  7.85mL GADAVIST GADOBUTROL 1 MMOL/ML IV SOLN   COMPARISON:  No prior MRI, correlation is made with CT cervical spine 04/07/2022   FINDINGS: Alignment: Trace retrolisthesis of C3 on C4 and C4 on C5, unchanged.   Vertebrae: No acute fracture or suspicious osseous lesion. No abnormal enhancement.   Cord: Normal signal and morphology.  No abnormal enhancement.   Posterior Fossa, vertebral arteries, paraspinal tissues: Negative.   Disc levels:   C2-C3: No significant disc bulge. Mild facet arthropathy. No spinal canal stenosis or neural foraminal narrowing.   C3-C4: Minimal disc bulge. Left-greater-than-right uncovertebral and facet arthropathy. Ligamentum flavum hypertrophy. Moderate spinal canal stenosis. Moderate right and severe left neural foraminal narrowing.   C4-C5: Mild disc bulge. Facet and uncovertebral hypertrophy. Moderate spinal canal stenosis. Severe bilateral neural foraminal narrowing.   C5-C6: Disc bulge with left subarticular protrusion. Facet arthropathy. No spinal canal stenosis. Mild right and moderate left neural foraminal narrowing.   C6-C7: Mild disc bulge. Facet and uncovertebral hypertrophy. Mild spinal canal stenosis. Severe left and moderate to severe right neural foraminal narrowing.   C7-T1: No significant disc bulge. No  spinal canal stenosis or neuroforaminal narrowing.   IMPRESSION: 1. C4-C5 moderate spinal canal stenosis and severe bilateral neural foraminal narrowing. 2. C3-C4 moderate spinal canal stenosis with severe left and moderate right neural foraminal narrowing. 3. C6-C7 mild spinal canal stenosis with severe left and moderate to severe right neural foraminal narrowing. 4. C5-C6 moderate left and mild right neural foraminal narrowing.     Electronically Signed   By: Wiliam Ke M.D.   On: 06/09/2022 00:48  Imaging: No results found.   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   Essential hypertension 07/02/2020   Prediabetes 07/02/2020   Polyuria 07/02/2020   Nontraumatic complete tear of right rotator cuff    Impingement syndrome of right shoulder    Neuropathy 12/05/2019   Malignant meningioma of meninges of brain 03/07/2019   Seizures 01/31/2019   Meningioma, recurrent of brain 01/31/2019   Meningioma 01/03/2019   Diffuse idiopathic skeletal hyperostosis 06/05/2013   Past Medical History:  Diagnosis Date  Anxiety    Brain tumor 02/20/2013   brain tumor removed in March 2014, Dr Rush Farmer   Diffuse idiopathic skeletal hyperostosis 06/05/2013   Dizziness    Enlarged prostate    GERD (gastroesophageal reflux disease)    Headache(784.0)    scattered   High cholesterol    Hypertension    Malignant meningioma of meninges of brain 03/07/2019   Meningioma    Meningioma, recurrent of brain 01/31/2019   Neuropathy 12/05/2019   Pre-diabetes    Rotator cuff tear    right   Seizures    11/27/19 last sz 1 wk ago    Family History  Problem Relation Age of Onset   Hypertension Mother    Dementia Mother    Diabetes Mother     Hypertension Father    CAD Father    Diabetes Father    CAD Brother     Past Surgical History:  Procedure Laterality Date   APPLICATION OF CRANIAL NAVIGATION N/A 01/10/2019   Procedure: APPLICATION OF CRANIAL NAVIGATION;  Surgeon: Maeola Harman, MD;  [facility];  Service: Neurosurgery;  Laterality: N/A;   CATARACT EXTRACTION Right    CRANIOTOMY Right 11/06/2012   Procedure: CRANIOTOMY TUMOR EXCISION;  Surgeon: Maeola Harman, MD;  Location: [facility]URO ORS;  Service: Neurosurgery;  Laterality: Right;  Right Parasagittal craniotomy for meningioma with Stealth   CRANIOTOMY Right 01/10/2019   Procedure: Right Parasagittal Craniotomy for Tumor;  Surgeon: Maeola Harman, MD;  Location: [facility];  Service: Neurosurgery;  Laterality: Right;  Right parasagittal craniotomy for tumor   ESOPHAGOGASTRODUODENOSCOPY     JOINT REPLACEMENT Right 2018   right hip   REVERSE SHOULDER ARTHROPLASTY Right 01/08/2021   Procedure: RIGHT REVERSE SHOULDER ARTHROPLASTY;  Surgeon: Cammy Copa, MD;  Location: [facility] OR;  Service: Orthopedics;  Laterality: Right;   right knee arthroscopy     SHOULDER ARTHROSCOPY Right 06/09/2020   Procedure: RIGHT SHOULDER ARTHROSCOPY AND DEBRIDEMENT;  Surgeon: Nadara Mustard, MD;  Location: [facility];  Service: Orthopedics;  Laterality: Right;   Social History   Occupational History   Not on file  Tobacco Use   Smoking status: Every Day    Packs/day: 0.01    Years: 15.00    Additional pack years: 0.00    Total pack years: 0.15    Types: Cigarettes    Last attempt to quit: 01/29/2019    Years since quitting: 3.7    Passive exposure: Past   Smokeless tobacco: Never   Tobacco comments:    weekend smoker  Vaping Use   Vaping Use: Some days   Substances: Nicotine, Flavoring  Substance and Sexual Activity   Alcohol use: Yes    Comment: social   Drug use: No   Sexual activity: Not Currently

## 2022-11-13 ENCOUNTER — Emergency Department (HOSPITAL_BASED_OUTPATIENT_CLINIC_OR_DEPARTMENT_OTHER)
Admission: EM | Admit: 2022-11-13 | Discharge: 2022-11-13 | Disposition: A | Discharge status: Home / Self Care | Payer: 59 | Attending: Emergency Medicine | Admitting: Emergency Medicine

## 2022-11-13 ENCOUNTER — Encounter (HOSPITAL_BASED_OUTPATIENT_CLINIC_OR_DEPARTMENT_OTHER): Payer: Self-pay

## 2022-11-13 ENCOUNTER — Emergency Department (HOSPITAL_BASED_OUTPATIENT_CLINIC_OR_DEPARTMENT_OTHER): Payer: 59

## 2022-11-13 ENCOUNTER — Other Ambulatory Visit: Payer: Self-pay

## 2022-11-13 DIAGNOSIS — Z7982 Long term (current) use of aspirin: Secondary | ICD-10-CM | POA: Diagnosis not present

## 2022-11-13 DIAGNOSIS — R519 Headache, unspecified: Secondary | ICD-10-CM | POA: Diagnosis not present

## 2022-11-13 DIAGNOSIS — Z85841 Personal history of malignant neoplasm of brain: Secondary | ICD-10-CM | POA: Diagnosis not present

## 2022-11-13 LAB — CBG MONITORING, ED: Glucose-Capillary: 83 mg/dL (ref 70–99)

## 2022-11-13 MED ORDER — KETOROLAC TROMETHAMINE 15 MG/ML IJ SOLN
15.0000 mg | Freq: Once | INTRAMUSCULAR | Status: AC
  Administered 2022-11-13: 15 mg via INTRAMUSCULAR
  Filled 2022-11-13: qty 1

## 2022-11-13 MED ORDER — METOCLOPRAMIDE HCL 10 MG PO TABS
10.0000 mg | ORAL_TABLET | Freq: Once | ORAL | Status: AC
  Administered 2022-11-13: 10 mg via ORAL
  Filled 2022-11-13: qty 1

## 2022-11-13 MED ORDER — DEXAMETHASONE 4 MG PO TABS
6.0000 mg | ORAL_TABLET | Freq: Once | ORAL | Status: AC
  Administered 2022-11-13: 6 mg via ORAL
  Filled 2022-11-13: qty 2

## 2022-11-13 NOTE — ED Provider Notes (Signed)
[place] EMERGENCY DEPARTMENT AT [facility] Provider Note   CSN: [id number] Arrival date & time: 11/13/22  1133     History  Chief Complaint  Patient presents with   Headache    Joshua [name] is a 62 y.o. male with past medical history of brain meningioma who presents to the ED for evaluation of a bilateral parietal headache for the last 2 weeks.  Patient states that headache is not severe in nature and feels like his typical headaches.  He has had 2 prior meningiomas removed with the last being over 5 years ago.  He has chronic history of headaches from these procedures.  States that he came to the ED for evaluation today to make sure the meningioma is not recurring.  Denies any new focal weakness, paresthesias, vision changes, nausea, vomiting, chest pain, shortness of breath, fever, or neck pain.  He states that he is going to follow-up with his neurologist this week and apart from a CT scan today does not desire any further workup for his headache.      Home Medications Prior to Admission medications   Medication Sig Start Date End Date Taking? Authorizing Provider  Accu-Chek Softclix Lancets lancets Use as instructed 08/13/21  Yes Hoy Register, MD  acetaminophen (TYLENOL) 500 MG tablet Take 1,000 mg by mouth every 6 (six) hours as needed for moderate pain or headache.   Yes [provider]  albuterol (VENTOLIN HFA) 108 (90 Base) MCG/ACT inhaler Inhale 2 puffs into the lungs every 6 (six) hours as needed for wheezing or shortness of breath. 08/13/21  Yes Newlin, Enobong, MD  amLODipine (NORVASC) 5 MG tablet TAKE 1 TABLET (5 MG TOTAL) BY MOUTH DAILY. TO LOWER BLOOD PRESSURE. STOP LOSARTAN 08/13/21  Yes Hoy Register, MD  aspirin 81 MG chewable tablet Chew 81 mg by mouth daily.   Yes [provider]  atorvastatin (LIPITOR) 20 MG tablet TAKE 1 TABLET (20 MG TOTAL) BY MOUTH DAILY. 12/08/21  Yes Hoy Register, MD  Blood Glucose Monitoring Suppl  (ACCU-CHEK GUIDE ME) w/Device KIT Use to check blood sugar once daily. 01/07/21  Yes Hoy Register, MD  clonazePAM (KLONOPIN) 0.5 MG tablet Take 1 tablet (0.5 mg total) by mouth at bedtime. 09/19/22  Yes Vaslow, Georgeanna Lea, MD  diazepam (VALIUM) 2 MG tablet Take 1 tablet (2 mg total) by mouth 2 (two) times daily. 08/15/21  Yes Wynetta Fines, MD  glucose blood (ACCU-CHEK GUIDE) test strip Use to check blood sugar once daily. 08/13/21  Yes Newlin, Odette Horns, MD  ketoconazole (NIZORAL) 2 % cream APPLY 1 APPLICATION. TOPICALLY DAILY. 02/28/22  Yes Louann Sjogren, DPM  Lacosamide 100 MG TABS Take 1 tablet (100 mg total) by mouth in the morning and at bedtime. 09/19/22  Yes Vaslow, Georgeanna Lea, MD  lamoTRIgine (LAMICTAL) 200 MG tablet TAKE 1 TABLET (200 MG TOTAL) BY MOUTH 2 (TWO) TIMES DAILY. 09/21/22  Yes Vaslow, Georgeanna Lea, MD  lidocaine (LIDODERM) 5 % Place 1 patch onto the skin daily. Remove & Discard patch within 12 hours or as directed by MD 04/07/22  Yes Karie Mainland, Amjad, PA-C  meclizine (ANTIVERT) 25 MG tablet Take 1 tablet (25 mg total) by mouth 3 (three) times daily as needed for dizziness. 01/04/22  Yes Horton, Clabe Seal, DO  metFORMIN (GLUCOPHAGE) 500 MG tablet Take 1 tablet (500 mg total) by mouth 2 (two) times daily. 08/13/21  Yes Hoy Register, MD  methocarbamol (ROBAXIN) 750 MG tablet Take 750 mg by mouth daily as  needed for muscle spasms.   Yes [provider]  omeprazole (PRILOSEC) 20 MG capsule Take 20 mg by mouth daily. 08/29/22  Yes [provider]  propranolol (INDERAL) 10 MG tablet Take 1 tablet (10 mg total) by mouth 3 (three) times daily as needed (palpitations). 05/31/22  Yes Swinyer, Zachary George, NP  tadalafil (CIALIS) 5 MG tablet Take 1 tablet (5 mg total) by mouth daily as needed for erectile dysfunction. 1 hour prior to sexual activity 01/17/22  Yes Sondra Come, MD  tamsulosin (FLOMAX) 0.4 MG CAPS capsule Take 1 capsule (0.4 mg total) by mouth daily. 11/23/21  Yes McGowan,  Carollee Herter A, PA-C  VITAMIN A PO Take 1 capsule by mouth daily.   Yes [provider]      Allergies    Patient has no known allergies.    Review of Systems   Review of Systems  All other systems reviewed and are negative.   Physical Exam Updated Vital Signs BP (!) 167/102 (BP Location: Right Arm)   Pulse 87   Temp 98.2 F (36.8 C) (Oral)   Resp 18   Ht 5\' 8"  (1.727 m)   Wt 79.4 kg   SpO2 100%   BMI 26.61 kg/m  Physical Exam Vitals and nursing note reviewed.  Constitutional:      General: He is not in acute distress.    Appearance: Normal appearance. He is not ill-appearing or toxic-appearing.  HENT:     Head: Normocephalic and atraumatic.     Mouth/Throat:     Mouth: Mucous membranes are moist.  Eyes:     General: No visual field deficit.    Extraocular Movements: Extraocular movements intact.     Conjunctiva/sclera: Conjunctivae normal.     Pupils: Pupils are equal, round, and reactive to light.  Cardiovascular:     Rate and Rhythm: Normal rate and regular rhythm.     Heart sounds: No murmur heard. Pulmonary:     Effort: Pulmonary effort is normal.     Breath sounds: Normal breath sounds.  Abdominal:     General: Abdomen is flat.     Palpations: Abdomen is soft.  Musculoskeletal:        General: Normal range of motion.     Cervical back: Normal range of motion and neck supple. No rigidity.     Right lower leg: No edema.     Left lower leg: No edema.     Comments: No midline CTL spinal tenderness, step-offs, or deformities  Skin:    General: Skin is warm and dry.     Capillary Refill: Capillary refill takes less than 2 seconds.  Neurological:     Mental Status: He is alert and oriented to person, place, and time. Mental status is at baseline.     GCS: GCS eye subscore is 4. GCS verbal subscore is 5. GCS motor subscore is 6.     Cranial Nerves: Cranial nerves 2-12 are intact. No cranial nerve deficit, dysarthria or facial asymmetry.     Comments:  Minor weakness to the left lower extremity, patient states equivalent to his baseline, remainder of motor exam is intact  Psychiatric:        Behavior: Behavior normal.     ED Results / Procedures / Treatments   Labs (all labs ordered are listed, but only abnormal results are displayed) Labs Reviewed  CBG MONITORING, ED    EKG None  Radiology CT Head Wo Contrast  Result Date: 11/13/2022 CLINICAL DATA:  Headache history of brain tumor EXAM: CT HEAD WITHOUT CONTRAST TECHNIQUE: Contiguous axial images were obtained from the base of the skull through the vertex without intravenous contrast. RADIATION DOSE REDUCTION: This exam was performed according to the departmental dose-optimization program which includes automated exposure control, adjustment of the mA and/or kV according to patient size and/or use of iterative reconstruction technique. COMPARISON:  09/15/2022 FINDINGS: Brain: No evidence of acute infarction, hemorrhage, hydrocephalus, extra-axial collection or mass lesion/mass effect. Periventricular white matter hypodensity. Unchanged encephalomalacia of the frontoparietal vertices (series 2, image 27). Vascular: No hyperdense vessel or unexpected calcification. Skull: Craniotomy of the skull vertex (series 3, image 74). Negative for fracture or focal lesion. Sinuses/Orbits: No acute finding. Other: None. IMPRESSION: 1. No acute intracranial pathology. 2. Unchanged encephalomalacia of the frontoparietal vertices. 3. Craniotomy of the skull vertex. 4. Small-vessel white matter disease. Electronically Signed   By: Jearld Lesch M.D.   On: 11/13/2022 14:22    Procedures Procedures    Medications Ordered in ED Medications  ketorolac (TORADOL) 15 MG/ML injection 15 mg (has no administration in time range)  metoCLOPramide (REGLAN) tablet 10 mg (has no administration in time range)  dexamethasone (DECADRON) tablet 6 mg (has no administration in time range)    ED Course/ Medical Decision  Making/ A&P                             Medical Decision Making Amount and/or Complexity of Data Reviewed Radiology: ordered. Decision-making details documented in ED Course.  Risk Prescription drug management.   Medical Decision Making:   Joshua [name] is a 62 y.o. male who presented to the ED today with headache detailed above.    Patient's presentation is complicated by their history of meningioma with tumor removal.  Complete initial physical exam performed, notably the patient was in no acute distress.  No meningismus.  At his neurological baseline.    Reviewed and confirmed nursing documentation for past medical history, family history, social history.    Initial Assessment:   With the patient's presentation of headache, differential diagnosis includes but is not limited to emergent considerations for headache include subarachnoid hemorrhage, meningitis, temporal arteritis, glaucoma, cerebral ischemia, carotid/vertebral dissection, intracranial tumor, venous sinus thrombosis, carbon monoxide poisoning, acute or chronic subdural hemorrhage.  Other considerations include: migraine, cluster headache, hypertension, caffeine, alcohol, or drug withdrawal, pseudotumor cerebri, arteriovenous malformation, head injury, neurocysticercosis, post-lumbar puncture, preeclampsia in those assigned male at birth and in reproductive age, tension headache, viral vs acute bacterial sinusitis, cervical arthritis, refractive error causing strain, temporomandibular joint syndrome, depression, somatoform disorder (eg, somatization), trigeminal neuralgia, glossopharyngeal neuralgia.  This is most consistent with an acute complicated illness  Initial Plan:  CT brain to assess for intracranial pathology Symptomatic management Objective evaluation as below reviewed   Initial Study Results:   Radiology:  All images reviewed independently. Agree with radiology report at this time.   CT Head Wo  Contrast  Result Date: 11/13/2022 CLINICAL DATA:  Headache history of brain tumor EXAM: CT HEAD WITHOUT CONTRAST TECHNIQUE: Contiguous axial images were obtained from the base of the skull through the vertex without intravenous contrast. RADIATION DOSE REDUCTION: This exam was performed according to the departmental dose-optimization program which includes automated exposure control, adjustment of the mA and/or kV according to patient size and/or use of iterative reconstruction technique. COMPARISON:  09/15/2022 FINDINGS: Brain: No evidence of acute infarction, hemorrhage, hydrocephalus, extra-axial collection or mass lesion/mass effect. Periventricular  white matter hypodensity. Unchanged encephalomalacia of the frontoparietal vertices (series 2, image 27). Vascular: No hyperdense vessel or unexpected calcification. Skull: Craniotomy of the skull vertex (series 3, image 74). Negative for fracture or focal lesion. Sinuses/Orbits: No acute finding. Other: None. IMPRESSION: 1. No acute intracranial pathology. 2. Unchanged encephalomalacia of the frontoparietal vertices. 3. Craniotomy of the skull vertex. 4. Small-vessel white matter disease. Electronically Signed   By: Jearld Lesch M.D.   On: 11/13/2022 14:22   US Guided Needle Placement - No Linked Charges  Result Date: 11/08/2022 Ultrasound imaging demonstrates needle placement into the glenohumeral joint with extravasation of fluid and no complication.   Final Assessment and Plan:   62 year old male presents to the ED for evaluation of a headache.  Patient states he has had this headache for the last 2 weeks.  Described as an achy pain to bilateral parietal lobes.  No associated vision changes, dizziness, fever, neck pain, or other acute symptoms.  At baseline, patient has minimal weakness to the left lower extremity post prior meningioma removal.  Comes in today to make sure meningioma has not recurred.  CT does not show recurrence and is similar to  previous.  Patient to follow-up with his neurologist this week for routine MRI scheduling.  Patient does not desire further workup in the ED today.  Will give migraine cocktail and discharge patient home as desired.  Strict ED return precautions given, all questions answered, and stable for discharge   Clinical Impression:  1. Acute intractable headache, unspecified headache type      Discharge           Final Clinical Impression(s) / ED Diagnoses Final diagnoses:  Acute intractable headache, unspecified headache type    Rx / DC Orders ED Discharge Orders     None         Tonette Lederer, PA-C 11/13/22 1503    Virgina Norfolk, DO 11/13/22 1546

## 2022-11-13 NOTE — Discharge Instructions (Signed)
Thank you for letting us take care of you today.  Your CT scan looks similar to your previous and we do not see a new meningioma on the scan today.  Please follow-up with your neurologist for the MRI as discussed.  We gave you Toradol, Reglan, and Decadron to treat your headache.  Some people get a side effect called akathisia which can make you feel anxious or restless after taking Reglan.  Typically we give this with Benadryl to prevent this side effect but as you are driving home we did not provide it in the ED today.  If you start having the side effect, please take a dose of Benadryl when you get home.  If you develop any new or worsening symptoms such as increased weakness on one side of your body, severe headache, head injury, vision changes, or other new, concerning symptoms, please return to the nearest emergency department for reevaluation.

## 2022-11-13 NOTE — ED Notes (Signed)
Dc instructions reviewed with patient. Patient voiced understanding. Dc with belongings.  °

## 2022-11-13 NOTE — ED Triage Notes (Signed)
In for eval of headache onset last week. H/O 2 previous tumor removals. Denies N/V or visual disturbances.

## 2022-11-17 ENCOUNTER — Telehealth: Payer: Self-pay | Admitting: Orthopedic Surgery

## 2022-11-17 NOTE — Telephone Encounter (Signed)
Called pt 1X and lfet vm for pt to call to set an MRI Review appt with Dr August Saucer or Franky Macho after 5/4.

## 2022-11-18 ENCOUNTER — Inpatient Hospital Stay: Admission: RE | Admit: 2022-11-18 | Payer: 59 | Source: Ambulatory Visit

## 2022-11-18 ENCOUNTER — Other Ambulatory Visit: Payer: 59

## 2022-11-21 ENCOUNTER — Ambulatory Visit (INDEPENDENT_AMBULATORY_CARE_PROVIDER_SITE_OTHER): Payer: 59 | Admitting: Podiatry

## 2022-11-21 ENCOUNTER — Encounter: Payer: Self-pay | Admitting: Podiatry

## 2022-11-21 DIAGNOSIS — M205X2 Other deformities of toe(s) (acquired), left foot: Secondary | ICD-10-CM

## 2022-11-21 DIAGNOSIS — R7303 Prediabetes: Secondary | ICD-10-CM | POA: Diagnosis not present

## 2022-11-21 DIAGNOSIS — M2042 Other hammer toe(s) (acquired), left foot: Secondary | ICD-10-CM

## 2022-11-21 DIAGNOSIS — M2041 Other hammer toe(s) (acquired), right foot: Secondary | ICD-10-CM | POA: Diagnosis not present

## 2022-11-21 DIAGNOSIS — G629 Polyneuropathy, unspecified: Secondary | ICD-10-CM | POA: Diagnosis not present

## 2022-11-21 NOTE — Progress Notes (Signed)
  Subjective:  Patient ID: Joshua [name], male    DOB: October 28, 1960,   MRN: [id number]  Chief Complaint  Patient presents with   Nail Problem     Left foot second toe ,patients wants looks at states he is a pre-diabetic     62 y.o. male presents for concern of left second toe. Patient is pre-diabetic and on metformin and wants to make sure his feet are doing well. Relates no pain in second toe currently but does get rubbing there as well as fifth toe. Also relates painful bunion on left foot.  Denies any other pedal complaints. Denies n/v/f/c.   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 CFT <3 seconds. Normal hair growth on digits. No edema.  Skin. No lacerations or abrasions bilateral feet. Hyperkeratosis noted to distal second digit.  Musculoskeletal: MMT 5/5 bilateral lower extremities in DF, PF, Inversion and Eversion. Deceased ROM in DF of ankle joint. Hammered second digit and adductovarus of fifth digit. Spurring noted to the dorsum of the first MPJ and minmal ROM of the first MPJ with mild HAV deformity notd. . Neurological: Sensation intact to light touch.   Assessment:   1. Hallux limitus, left   2. Hammertoe, bilateral   3. Neuropathy   4. Prediabetes      Plan:  Patient was evaluated and treated and all questions answered. -Educated on hammertoes and treatment options   -Discussed padding including toe caps and crest pads.  -Discussed need for potential surgery if pain does not improved.  Discussed hallux limitus as well and treatmnet options patient may be considering surgery in future.  -Patient to follow-up as needed. Discussed calling if any changes or increased pain.    Louann Sjogren, DPM

## 2022-11-23 NOTE — Progress Notes (Unsigned)
11/24/2022 1:01 PM   Joshua [name] [birth year] [id number]  Referring provider: Hoy Register, MD [address] [place] [address] [place],  [place] [address]  Urological history: 1. Prostate Cancer -PSA pending  -iPSA 3.9 -prostate MRI (2023) PI-RADS 4 in left peripheral zone mid gland/apex -Cognitive biopsy pathology (03/2022) for total cores positive for low risk Gleason score 3+3 equal 6 prostate cancer with 60% max core involvement -Currently on active surveillance -Will need confirmatory prostate biopsy in October 2024  2. BPH with LU TS -prostate volume 41 grams -Tamsulosin 0.4 mg daily  3. ED -Contributing factors of age, prostate cancer, BPH, hypertension, prediabetes, anxiety and history of smoking -tadalafil 5 mg daily    No chief complaint on file.   HPI: Joshua [name] is a 62 y.o. male who presents today for 6 month follow up.   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   Meningioma (HCC)    Meningioma, recurrent of brain (HCC) 01/31/2019   Neuropathy 12/05/2019   Pre-diabetes    Rotator cuff tear    right   Seizures (HCC)    11/27/19 last sz 1 wk ago    Surgical History: Past Surgical History:  Procedure Laterality Date   APPLICATION OF CRANIAL NAVIGATION N/A 01/10/2019   Procedure: APPLICATION OF CRANIAL NAVIGATION;  Surgeon: Maeola Harman, MD;  Location[facility];  Service: Neurosurgery;  Laterality: N/A;   CATARACT  EXTRACTION Right    CRANIOTOMY Right 11/06/2012   Procedure: CRANIOTOMY TUMOR EXCISION;  Surgeon: Maeola Harman, MD;  Location: [facility];  Service: Neurosurgery;  Laterality: Right;  Right Parasagittal craniotomy for meningioma with Stealth   CRANIOTOMY Right 01/10/2019   Procedure: Right Parasagittal Craniotomy for Tumor;  Surgeon: Maeola Harman, MD;  Location: [facility] OR;  Service: Neurosurgery;  Laterality: Right;  Right parasagittal craniotomy for tumor   ESOPHAGOGASTRODUODENOSCOPY     JOINT REPLACEMENT Right 2018   right hip   REVERSE SHOULDER ARTHROPLASTY Right 01/08/2021   Procedure: RIGHT REVERSE SHOULDER ARTHROPLASTY;  Surgeon: Cammy Copa, MD;  Location: [facility] OR;  Service: Orthopedics;  Laterality: Right;   right knee arthroscopy     SHOULDER ARTHROSCOPY Right 06/09/2020   Procedure: RIGHT SHOULDER ARTHROSCOPY AND DEBRIDEMENT;  Surgeon: Nadara Mustard, MD;  Location: [facility];  Service: Orthopedics;  Laterality: Right;    Home Medications:  Allergies as of 11/24/2022   No Known Allergies      Medication List        Accurate as of Nov 23, 2022  1:01 PM. If you have any questions, ask your nurse or doctor.          Accu-Chek Guide Me w/Device Kit Use to check blood sugar once daily.   Accu-Chek Guide test strip Generic drug: glucose blood Use to check blood sugar once daily.   Accu-Chek  Softclix Lancets lancets Use as instructed   acetaminophen 500 MG tablet Commonly known as: TYLENOL Take 1,000 mg by mouth every 6 (six) hours as needed for moderate pain or headache.   albuterol 108 (90 Base) MCG/ACT inhaler Commonly known as: VENTOLIN HFA Inhale 2 puffs into the lungs every 6 (six) hours as needed for wheezing or shortness of breath.   amLODipine 5 MG tablet Commonly known as: NORVASC TAKE 1 TABLET (5 MG TOTAL) BY MOUTH DAILY. TO LOWER BLOOD PRESSURE. STOP LOSARTAN   aspirin 81 MG chewable tablet Chew 81 mg by mouth daily.    atorvastatin 20 MG tablet Commonly known as: LIPITOR TAKE 1 TABLET (20 MG TOTAL) BY MOUTH DAILY.   clonazePAM 0.5 MG tablet Commonly known as: KLONOPIN Take 1 tablet (0.5 mg total) by mouth at bedtime.   diazepam 2 MG tablet Commonly known as: VALIUM Take 1 tablet (2 mg total) by mouth 2 (two) times daily.   ketoconazole 2 % cream Commonly known as: NIZORAL APPLY 1 APPLICATION. TOPICALLY DAILY.   Lacosamide 100 MG Tabs Take 1 tablet (100 mg total) by mouth in the morning and at bedtime.   lamoTRIgine 200 MG tablet Commonly known as: LAMICTAL TAKE 1 TABLET (200 MG TOTAL) BY MOUTH 2 (TWO) TIMES DAILY.   lidocaine 5 % Commonly known as: Lidoderm Place 1 patch onto the skin daily. Remove & Discard patch within 12 hours or as directed by MD   meclizine 25 MG tablet Commonly known as: ANTIVERT Take 1 tablet (25 mg total) by mouth 3 (three) times daily as needed for dizziness.   metFORMIN 500 MG tablet Commonly known as: GLUCOPHAGE Take 1 tablet (500 mg total) by mouth 2 (two) times daily.   methocarbamol 750 MG tablet Commonly known as: ROBAXIN Take 750 mg by mouth daily as needed for muscle spasms.   omeprazole 20 MG capsule Commonly known as: PRILOSEC Take 20 mg by mouth daily.   propranolol 10 MG tablet Commonly known as: INDERAL Take 1 tablet (10 mg total) by mouth 3 (three) times daily as needed (palpitations).   tadalafil 5 MG tablet Commonly known as: CIALIS Take 1 tablet (5 mg total) by mouth daily as needed for erectile dysfunction. 1 hour prior to sexual activity   tamsulosin 0.4 MG Caps capsule Commonly known as: FLOMAX Take 1 capsule (0.4 mg total) by mouth daily.   VITAMIN A PO Take 1 capsule by mouth daily.        Allergies: No Known Allergies  Family History: Family History  Problem Relation Age of Onset   Hypertension Mother    Dementia Mother    Diabetes Mother    Hypertension Father    CAD Father    Diabetes Father    CAD Brother      Social History:  reports that he has been smoking cigarettes. He has a 0.15 pack-year smoking history. He has been exposed to tobacco smoke. He has never used smokeless tobacco. He reports current alcohol use. He reports that he does not use drugs.  ROS: Pertinent ROS in HPI  Physical Exam: There were no vitals taken for this visit.  Constitutional:  Well nourished. Alert and oriented, No acute distress. HEENT: [place] AT, moist mucus membranes.  Trachea midline Cardiovascular: No clubbing, cyanosis, or edema. Respiratory: Normal respiratory effort, no increased work of breathing. GU: No CVA tenderness.  No bladder fullness or masses.  Patient with circumcised/uncircumcised phallus. ***Foreskin easily retracted***  Urethral meatus is patent.  No  penile discharge. No penile lesions or rashes. Scrotum without lesions, cysts, rashes and/or edema.  Testicles are located scrotally bilaterally. No masses are appreciated in the testicles. Left and right epididymis are normal. Rectal: Patient with  normal sphincter tone. Anus and perineum without scarring or rashes. No rectal masses are appreciated. Prostate is approximately *** grams, *** nodules are appreciated. Seminal vesicles are normal. Neurologic: Grossly intact, no focal deficits, moving all 4 extremities. Psychiatric: Normal mood and affect.   Laboratory Data: Lab Results  Component Value Date   WBC 6.4 09/18/2022   HGB 15.4 09/18/2022   HCT 46.7 09/18/2022   MCV 95.1 09/18/2022   PLT 246 09/18/2022    Lab Results  Component Value Date   CREATININE 0.88 09/18/2022    Lab Results  Component Value Date   AST 28 09/18/2022   Lab Results  Component Value Date   ALT 22 09/18/2022  I have reviewed the labs.   Pertinent Imaging: N/A   Assessment & Plan:    1. Prostate cancer -low grade -on active surveillance -will need confirmatory biopsy in October  2. BPH with LU TS -continue tamsulosin 0.4 mg daily  3.  ED -continue tadalafil 5 mg daily    No follow-ups on file.  These notes generated with voice recognition software. I apologize for typographical errors.  Cloretta Ned  [facility] [address]  [address] [place], [place] [address] [phone]

## 2022-11-24 ENCOUNTER — Ambulatory Visit (INDEPENDENT_AMBULATORY_CARE_PROVIDER_SITE_OTHER): Payer: 59 | Admitting: Urology

## 2022-11-24 ENCOUNTER — Ambulatory Visit (HOSPITAL_COMMUNITY): Payer: 59 | Attending: Internal Medicine

## 2022-11-24 ENCOUNTER — Encounter: Payer: Self-pay | Admitting: Urology

## 2022-11-24 ENCOUNTER — Other Ambulatory Visit: Payer: Self-pay

## 2022-11-24 VITALS — BP 145/80 | HR 80 | Ht 69.0 in | Wt 181.7 lb

## 2022-11-24 DIAGNOSIS — R972 Elevated prostate specific antigen [PSA]: Secondary | ICD-10-CM

## 2022-11-24 DIAGNOSIS — N401 Enlarged prostate with lower urinary tract symptoms: Secondary | ICD-10-CM | POA: Diagnosis not present

## 2022-11-24 DIAGNOSIS — R0789 Other chest pain: Secondary | ICD-10-CM | POA: Insufficient documentation

## 2022-11-24 DIAGNOSIS — C61 Malignant neoplasm of prostate: Secondary | ICD-10-CM | POA: Diagnosis not present

## 2022-11-24 DIAGNOSIS — N529 Male erectile dysfunction, unspecified: Secondary | ICD-10-CM

## 2022-11-24 DIAGNOSIS — N138 Other obstructive and reflux uropathy: Secondary | ICD-10-CM

## 2022-11-24 LAB — ECHOCARDIOGRAM COMPLETE
Area-P 1/2: 4.18 cm2
Height: 69 in
S' Lateral: 2.7 cm
Weight: 2907.2 oz

## 2022-11-25 LAB — PSA: Prostate Specific Ag, Serum: 5.6 ng/mL — ABNORMAL HIGH (ref 0.0–4.0)

## 2022-11-28 ENCOUNTER — Telehealth: Payer: Self-pay

## 2022-11-28 ENCOUNTER — Telehealth: Payer: Self-pay | Admitting: *Deleted

## 2022-11-28 NOTE — Telephone Encounter (Signed)
Patient called back to discuss lab results. Patient advised as below  Joshua Battiest, PA-C 11/25/2022  8:25 AM EDT     I tried to get in contact with him but I had to leave a message.  His PSA has increased and we need to get him scheduled for a prostate biopsy in the next few weeks rather than wait until October.  He does take an ASA daily.   Patient verbalized understanding. Appointment scheduled. Sent cardiac clearance to cardiologist. Sent prep information via mychart message to the patient.  Per [place] sending to Dr Richardo Hanks to review to Lorain Childes, originally was scheduled in October for repeat biopsy. Thank you

## 2022-11-28 NOTE — Telephone Encounter (Signed)
Patient already has an in office appointment with Dr. Mayford Knife on 5/14 at 9 am. I will fax pre-op clearance recommendations to requesting provider's office. I will update appt notes to reflect pre-op clearance.

## 2022-11-28 NOTE — Telephone Encounter (Signed)
   Name: Joshua [name]  DOB: 1960-09-03  MRN: [id number]  Primary Cardiologist: Armanda Magic, MD  Chart reviewed as part of pre-operative protocol coverage. Because of Nolon Hanneman Benison's past medical history and time since last visit, he will require a follow-up in-office visit in order to better assess preoperative cardiovascular risk.  Pre-op covering staff: - Please schedule appointment and call patient to inform them. If patient already had an upcoming appointment within acceptable timeframe, please add "pre-op clearance" to the appointment notes so provider is aware. - Please contact requesting surgeon's office via preferred method (i.e, phone, fax) to inform them of need for appointment prior to surgery.  Should be okay to hold aspirin x 7 days prior to the procedure and restart medically safe to do so.  Sharlene Dory, PA-C  11/28/2022, 1:18 PM

## 2022-11-28 NOTE — Telephone Encounter (Signed)
   Pre-operative Risk Assessment    Patient Name: Joshua [name]  DOB: 06-16-1961 MRN: [id number]      Request for Surgical Clearance    Procedure:   PROSTATE BIOPY  Date of Surgery:  Clearance 01/04/23                                 Surgeon:   Surgeon's Group or Practice Name:  [facility] Phone number:  [phone] Fax number:  [phone]    Type of Clearance Requested:   - Medical  - Pharmacy:  Hold Aspirin NOT INDICATED HOW LONG   Type of Anesthesia:  Not Indicated   Additional requests/questions:    Wilhemina Cash   11/28/2022, 12:57 PM

## 2022-11-28 NOTE — Progress Notes (Deleted)
Cardiology Office Note    Date:  11/28/2022   ID:  Joshua [name], DOB 21-Jan-1961, MRN [id number]  PCP:  Pediatrics, [facility]  Cardiologist:  Armanda Magic, MD   No chief complaint on file.   History of Present Illness:  Joshua [name] is a 62 y.o. male with a hx of malignant meningioma s/p resection 2014 and reoccurence 12/2018 s/p resection with seizures and recent dx of aortic atherosclerosis done at time of workup of Neurologic symptoms.  This was noted Ct angio of neck.    He is here today for followup and is doing well.  He denies any chest pain or pressure, SOB, DOE, PND, orthopnea, LE edema, dizziness, palpitations or syncope. He is compliant with his meds and is tolerating meds with no SE.       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   Meningioma (HCC)    Meningioma, recurrent of brain (HCC) 01/31/2019   Neuropathy 12/05/2019   Pre-diabetes    Rotator cuff tear    right   Seizures (HCC)    11/27/19 last sz 1 wk ago    Past Surgical History:  Procedure Laterality Date   APPLICATION OF CRANIAL NAVIGATION N/A 01/10/2019   Procedure: APPLICATION OF CRANIAL NAVIGATION;  Surgeon: Maeola Harman, MD;  Location: [facility] OR;  Service: Neurosurgery;  Laterality: N/A;   CATARACT EXTRACTION Right    CRANIOTOMY Right 11/06/2012   Procedure: CRANIOTOMY TUMOR EXCISION;  Surgeon: Maeola Harman, MD;  Location: [facility];  Service: Neurosurgery;  Laterality: Right;  Right Parasagittal craniotomy for meningioma with Stealth   CRANIOTOMY Right 01/10/2019   Procedure: Right Parasagittal Craniotomy for Tumor;  Surgeon:  Maeola Harman, MD;  Location: [facility] OR;  Service: Neurosurgery;  Laterality: Right;  Right parasagittal craniotomy for tumor   ESOPHAGOGASTRODUODENOSCOPY     JOINT REPLACEMENT Right 2018   right hip   REVERSE SHOULDER ARTHROPLASTY Right 01/08/2021   Procedure: RIGHT REVERSE SHOULDER ARTHROPLASTY;  Surgeon: Cammy Copa, MD;  Location: [facility] OR;  Service: Orthopedics;  Laterality: Right;   right knee arthroscopy     SHOULDER ARTHROSCOPY Right 06/09/2020   Procedure: RIGHT SHOULDER ARTHROSCOPY AND DEBRIDEMENT;  Surgeon: Nadara Mustard, MD;  Location: [facility];  Service: Orthopedics;  Laterality: Right;    Current Medications: No outpatient medications have been marked as taking for the 11/29/22 encounter (Appointment) with Quintella Reichert, MD.    Allergies:   Patient has no known allergies.   Social History   Socioeconomic History   Marital status: Legally Separated    Spouse name: Not on file   Number of children: 2   Years of education: Not on file   Highest education level: Not on file  Occupational History   Not on file  Tobacco Use   Smoking status: Every Day    Packs/day: 0.01    Years: 15.00    Additional pack years: 0.00    Total pack years: 0.15    Types: Cigarettes    Last attempt to quit: 01/29/2019    Years since  quitting: 3.8    Passive exposure: Past   Smokeless tobacco: Never   Tobacco comments:    weekend smoker  Vaping Use   Vaping Use: Some days   Substances: Nicotine, Flavoring  Substance and Sexual Activity   Alcohol use: Yes    Comment: social   Drug use: No   Sexual activity: Not Currently  Other Topics Concern   Not on file  Social History Narrative      Caffeine- soda occass   Social Determinants of Health   Financial Resource Strain: Not on file  Food Insecurity: Not on file  Transportation Needs: No Transportation Needs (01/31/2019)   PRAPARE - Administrator, Civil Service (Medical): No    Lack of  Transportation (Non-Medical): No  Physical Activity: Not on file  Stress: Not on file  Social Connections: Not on file     Family History:  The patient's family history includes CAD in his brother and father; Dementia in his mother; Diabetes in his father and mother; Hypertension in his father and mother.   ROS:   Please see the history of present illness.    ROS All other systems reviewed and are negative.      No data to display              PHYSICAL EXAM:   VS:  There were no vitals taken for this visit.   GEN: Well nourished, well developed in no acute distress HEENT: Normal NECK: No JVD; No carotid bruits LYMPHATICS: No lymphadenopathy CARDIAC:RRR, no murmurs, rubs, gallops RESPIRATORY:  Clear to auscultation without rales, wheezing or rhonchi  ABDOMEN: Soft, non-tender, non-distended MUSCULOSKELETAL:  No edema; No deformity  SKIN: Warm and dry NEUROLOGIC:  Alert and oriented x 3 PSYCHIATRIC:  Normal affect  Wt Readings from Last 3 Encounters:  11/24/22 181 lb 11.2 oz (82.4 kg)  11/13/22 175 lb (79.4 kg)  10/21/22 189 lb 9.6 oz (86 kg)      Studies/Labs Reviewed:   EKG:  EKG is not ordered today.    Recent Labs: 09/18/2022: ALT 22; BUN 15; Creatinine, Ser 0.88; Hemoglobin 15.4; Magnesium 2.1; Platelets 246; Potassium 4.8; Sodium 139   Lipid Panel    Component Value Date/Time   CHOL 116 12/27/2021 1218   TRIG 51 12/27/2021 1218   HDL 64 12/27/2021 1218   CHOLHDL 1.8 12/27/2021 1218   LDLCALC 40 12/27/2021 1218    Additional studies/ records that were reviewed today include:  OV notes and CT of neck    ASSESSMENT:    No diagnosis found.    PLAN:  In order of problems listed above:  1.  Aortic atherosclerosis -noted on Neck CT done for workup of neurologic sx.  -no evidence of carotic artery disease -this was not noted on a Chest CTA done 02/2019 -no coronary artery calcifications mentioned on CT -Continue aggressive risk factor  modification of BP control  2.  HLD -followed by PCP -Check FLP and ALT -Continue prescription drug management with atorvastatin 20 mg daily with as needed refills  3.  Fm hx of premature CAD -he is asymptomatic but has a very strong fm hx of brothers and his father having MIs at an early age -he also has HTN, HLD and vascular disease of his aorta Eugenie Birks Myoview 2021 showed no ischemia -Coronary calcium score 2021 was 0  4.  Palpitations -Event monitor 2021 was normal -He has not had any further palpitations -he is still having palpitations -I will  repeat an event monitor  5.  Hypertension -BP is adequately controlled on exam today -Continue prescription drug management with amlodipine 5 mg daily with as needed refills    Medication Adjustments/Labs and Tests Ordered: Current medicines are reviewed at length with the patient today.  Concerns regarding medicines are outlined above.  Medication changes, Labs and Tests ordered today are listed in the Patient Instructions below.  There are no Patient Instructions on file for this visit.   Signed, Armanda Magic, MD  11/28/2022 10:09 PM    [facility] [address] [place], [place], [place]  [address] Phone: [phone]; Fax: [phone]

## 2022-11-29 ENCOUNTER — Ambulatory Visit: Payer: 59 | Attending: Cardiology | Admitting: Cardiology

## 2022-11-30 ENCOUNTER — Encounter: Payer: Self-pay | Admitting: Cardiology

## 2022-12-08 ENCOUNTER — Other Ambulatory Visit: Payer: Self-pay | Admitting: Internal Medicine

## 2022-12-09 ENCOUNTER — Emergency Department (HOSPITAL_BASED_OUTPATIENT_CLINIC_OR_DEPARTMENT_OTHER)
Admission: EM | Admit: 2022-12-09 | Discharge: 2022-12-09 | Disposition: A | Discharge status: Home / Self Care | Payer: 59 | Attending: Emergency Medicine | Admitting: Emergency Medicine

## 2022-12-09 ENCOUNTER — Other Ambulatory Visit: Payer: Self-pay

## 2022-12-09 ENCOUNTER — Encounter (HOSPITAL_BASED_OUTPATIENT_CLINIC_OR_DEPARTMENT_OTHER): Payer: Self-pay | Admitting: Emergency Medicine

## 2022-12-09 DIAGNOSIS — W182XXA Fall in (into) shower or empty bathtub, initial encounter: Secondary | ICD-10-CM | POA: Diagnosis not present

## 2022-12-09 DIAGNOSIS — Z7982 Long term (current) use of aspirin: Secondary | ICD-10-CM | POA: Insufficient documentation

## 2022-12-09 DIAGNOSIS — S00432A Contusion of left ear, initial encounter: Secondary | ICD-10-CM

## 2022-12-09 DIAGNOSIS — S01312A Laceration without foreign body of left ear, initial encounter: Secondary | ICD-10-CM | POA: Diagnosis not present

## 2022-12-09 DIAGNOSIS — S0991XA Unspecified injury of ear, initial encounter: Secondary | ICD-10-CM | POA: Diagnosis present

## 2022-12-09 DIAGNOSIS — Y92002 Bathroom of unspecified non-institutional (private) residence single-family (private) house as the place of occurrence of the external cause: Secondary | ICD-10-CM | POA: Insufficient documentation

## 2022-12-09 NOTE — ED Triage Notes (Signed)
Pt arrived POV from home, caox4, ambulatory, NAD. Pt reports mechanical fall in bathroom last night around midnight causing him to hit his head on the bath tub. Pt states he came in this morning because he noticed some clotted blood in his ear. Pt denies LOC. Pt does not take blood thinner. PMS intact in all extremities. Pt denies any pain. Pt denies dizziness, N/V, headache, or visual abnormalities. Small laceration to the R. Ear, bleeding controlled.

## 2022-12-09 NOTE — ED Notes (Signed)
Discharge paperwork given and verbally understood. 

## 2022-12-09 NOTE — ED Notes (Signed)
Provider notified dermabond in room.Marland KitchenMarland Kitchen

## 2022-12-09 NOTE — Discharge Instructions (Signed)
Keep your ears dry the rest of today and then gentle shower starting tomorrow. Watch for signs of infection such as spreading redness, pus draining or fevers. Use Tylenol every 4 hours as needed for pain. Return if ear swelling worsens.

## 2022-12-09 NOTE — ED Provider Notes (Signed)
[place] EMERGENCY DEPARTMENT AT [facility] Provider Note   CSN: [id number] Arrival date & time: 12/09/22  4098     History  Chief Complaint  Patient presents with   Ear Injury    Joshua [name] is a 62 y.o. male.  Patient presents after mechanical fall in the bathroom last night around midnight.  Patient hit the left side of head/ear in the bathtub without syncope seizures or vomiting.  Patient not on anticoagulants.  Neurologically doing well.  Denies any headache dizziness.  Mild bleeding from left ear cut.       Home Medications Prior to Admission medications   Medication Sig Start Date End Date Taking? Authorizing Provider  Accu-Chek Softclix Lancets lancets Use as instructed 08/13/21   Hoy Register, MD  acetaminophen (TYLENOL) 500 MG tablet Take 1,000 mg by mouth every 6 (six) hours as needed for moderate pain or headache.    [provider]  albuterol (VENTOLIN HFA) 108 (90 Base) MCG/ACT inhaler Inhale 2 puffs into the lungs every 6 (six) hours as needed for wheezing or shortness of breath. 08/13/21   Newlin, Enobong, MD  amLODipine (NORVASC) 5 MG tablet TAKE 1 TABLET (5 MG TOTAL) BY MOUTH DAILY. TO LOWER BLOOD PRESSURE. STOP LOSARTAN 08/13/21   Hoy Register, MD  aspirin 81 MG chewable tablet Chew 81 mg by mouth daily.    [provider]  atorvastatin (LIPITOR) 20 MG tablet TAKE 1 TABLET (20 MG TOTAL) BY MOUTH DAILY. 12/08/21   Hoy Register, MD  Blood Glucose Monitoring Suppl (ACCU-CHEK GUIDE ME) w/Device KIT Use to check blood sugar once daily. 01/07/21   Hoy Register, MD  clonazePAM (KLONOPIN) 0.5 MG tablet TAKE 1 TABLET (0.5 MG TOTAL) BY MOUTH AT BEDTIME. 12/08/22   Vaslow, Georgeanna Lea, MD  diazepam (VALIUM) 2 MG tablet Take 1 tablet (2 mg total) by mouth 2 (two) times daily. 08/15/21   Wynetta Fines, MD  glucose blood (ACCU-CHEK GUIDE) test strip Use to check blood sugar once daily. 08/13/21   Hoy Register, MD  ketoconazole  (NIZORAL) 2 % cream APPLY 1 APPLICATION. TOPICALLY DAILY. 02/28/22   Louann Sjogren, DPM  Lacosamide 100 MG TABS TAKE 1 TABLET (100 MG TOTAL) BY MOUTH IN THE MORNING AND AT BEDTIME. 12/08/22   Vaslow, Georgeanna Lea, MD  lamoTRIgine (LAMICTAL) 200 MG tablet TAKE 1 TABLET (200 MG TOTAL) BY MOUTH 2 (TWO) TIMES DAILY. 09/21/22   Vaslow, Georgeanna Lea, MD  lidocaine (LIDODERM) 5 % Place 1 patch onto the skin daily. Remove & Discard patch within 12 hours or as directed by MD 04/07/22   Marita Kansas, PA-C  meclizine (ANTIVERT) 25 MG tablet Take 1 tablet (25 mg total) by mouth 3 (three) times daily as needed for dizziness. 01/04/22   Horton, Clabe Seal, DO  metFORMIN (GLUCOPHAGE) 500 MG tablet Take 1 tablet (500 mg total) by mouth 2 (two) times daily. 08/13/21   Hoy Register, MD  methocarbamol (ROBAXIN) 750 MG tablet Take 750 mg by mouth daily as needed for muscle spasms.    [provider]  omeprazole (PRILOSEC) 20 MG capsule Take 20 mg by mouth daily. 08/29/22   [provider]  propranolol (INDERAL) 10 MG tablet Take 1 tablet (10 mg total) by mouth 3 (three) times daily as needed (palpitations). 05/31/22   Swinyer, Zachary George, NP  tadalafil (CIALIS) 5 MG tablet Take 1 tablet (5 mg total) by mouth daily as needed for erectile dysfunction. 1 hour prior to sexual activity 01/17/22  Sondra Come, MD  tamsulosin (FLOMAX) 0.4 MG CAPS capsule Take 1 capsule (0.4 mg total) by mouth daily. 11/23/21   Michiel Cowboy A, PA-C  VITAMIN A PO Take 1 capsule by mouth daily.    [provider]      Allergies    Patient has no known allergies.    Review of Systems   Review of Systems  Constitutional:  Negative for chills and fever.  HENT:  Negative for congestion.   Eyes:  Negative for visual disturbance.  Respiratory:  Negative for shortness of breath.   Cardiovascular:  Negative for chest pain.  Gastrointestinal:  Negative for abdominal pain and vomiting.  Genitourinary:  Negative for dysuria and  flank pain.  Musculoskeletal:  Negative for back pain, neck pain and neck stiffness.  Skin:  Positive for wound. Negative for rash.  Neurological:  Negative for light-headedness and headaches.    Physical Exam Updated Vital Signs BP 111/82 (BP Location: Right Arm)   Pulse 75   Temp 98.3 F (36.8 C) (Oral)   Resp 18   Ht 5\' 9"  (1.753 m)   Wt 81.6 kg   SpO2 96%   BMI 26.58 kg/m  Physical Exam Vitals and nursing note reviewed.  Constitutional:      General: He is not in acute distress.    Appearance: He is well-developed.  HENT:     Head: Normocephalic.     Comments: Patient has 1 cm superficial laceration left earlobe without hematoma, no active bleeding.  No hemotympanums.  No bony tenderness.    Mouth/Throat:     Mouth: Mucous membranes are moist.  Eyes:     General:        Right eye: No discharge.        Left eye: No discharge.     Conjunctiva/sclera: Conjunctivae normal.  Neck:     Trachea: No tracheal deviation.  Cardiovascular:     Rate and Rhythm: Normal rate.  Pulmonary:     Effort: Pulmonary effort is normal.  Abdominal:     General: There is no distension.     Palpations: Abdomen is soft.     Tenderness: There is no abdominal tenderness. There is no guarding.  Musculoskeletal:     Cervical back: Normal range of motion and neck supple. No rigidity.  Skin:    General: Skin is warm.     Capillary Refill: Capillary refill takes less than 2 seconds.  Neurological:     General: No focal deficit present.     Mental Status: He is alert.     Cranial Nerves: No cranial nerve deficit.  Psychiatric:        Mood and Affect: Mood normal.     ED Results / Procedures / Treatments   Labs (all labs ordered are listed, but only abnormal results are displayed) Labs Reviewed - No data to display  EKG None  Radiology No results found.  Procedures .Marland KitchenLaceration Repair  Date/Time: 12/09/2022 11:03 AM  Performed by: Blane Ohara, MD Authorized by: Blane Ohara, MD   Consent:    Consent obtained:  Verbal   Consent given by:  Patient   Risks, benefits, and alternatives were discussed: yes     Risks discussed:  Pain and infection Universal protocol:    Procedure explained and questions answered to patient or proxy's satisfaction: yes     Patient identity confirmed:  Verbally with patient Anesthesia:    Anesthesia method:  None Laceration details:  Location:  Ear   Ear location:  L ear   Length (cm):  1   Depth (mm):  2 Exploration:    Limited defect created (wound extended): no     Hemostasis achieved with:  Direct pressure   Wound extent: areolar tissue not violated and fascia not violated     Contaminated: no   Treatment:    Amount of cleaning:  Standard   Debridement:  None Skin repair:    Repair method:  Tissue adhesive Approximation:    Approximation:  Close     Medications Ordered in ED Medications - No data to display  ED Course/ Medical Decision Making/ A&P                             Medical Decision Making  Patient presents with isolated left ear contusion and laceration.  Fortunately no hematoma, no vomiting normal neurologic exam.  No indication for CT scan of the head.  Wound cleaned with alcohol swab and then thin layer Dermabond applied on top.  Fortunately no sutures or significant repair needed.  Supportive care discussed with patient who is comfortable this plan.        Final Clinical Impression(s) / ED Diagnoses Final diagnoses:  Contusion of auricle of left ear, initial encounter  Complex laceration of left ear, initial encounter    Rx / DC Orders ED Discharge Orders     None         Blane Ohara, MD 12/09/22 1104

## 2022-12-19 ENCOUNTER — Ambulatory Visit: Payer: 59 | Attending: Physician Assistant

## 2022-12-19 DIAGNOSIS — E785 Hyperlipidemia, unspecified: Secondary | ICD-10-CM

## 2022-12-19 DIAGNOSIS — I700 Atherosclerosis of aorta: Secondary | ICD-10-CM

## 2022-12-19 DIAGNOSIS — R002 Palpitations: Secondary | ICD-10-CM | POA: Diagnosis not present

## 2022-12-19 DIAGNOSIS — I10 Essential (primary) hypertension: Secondary | ICD-10-CM

## 2022-12-19 DIAGNOSIS — E7800 Pure hypercholesterolemia, unspecified: Secondary | ICD-10-CM

## 2022-12-19 LAB — HEPATIC FUNCTION PANEL
ALT: 28 IU/L (ref 0–44)
AST: 24 IU/L (ref 0–40)
Albumin: 4.7 g/dL (ref 3.9–4.9)
Alkaline Phosphatase: 88 IU/L (ref 44–121)
Bilirubin Total: 0.2 mg/dL (ref 0.0–1.2)
Bilirubin, Direct: <0.1 mg/dL (ref 0.00–0.40)
Total Protein: 7.1 g/dL (ref 6.0–8.5)

## 2022-12-19 LAB — LIPID PANEL
Chol/HDL Ratio: 2 ratio (ref 0.0–5.0)
Cholesterol, Total: 156 mg/dL (ref 100–199)
HDL: 78 mg/dL (ref >39)
LDL Chol Calc (NIH): 49 mg/dL (ref 0–99)
Triglycerides: 178 mg/dL — ABNORMAL HIGH (ref 0–149)
VLDL Cholesterol Cal: 29 mg/dL (ref 5–40)

## 2022-12-22 ENCOUNTER — Telehealth: Payer: Self-pay | Admitting: Cardiology

## 2022-12-22 DIAGNOSIS — M25672 Stiffness of left ankle, not elsewhere classified: Secondary | ICD-10-CM | POA: Diagnosis not present

## 2022-12-22 DIAGNOSIS — M25652 Stiffness of left hip, not elsewhere classified: Secondary | ICD-10-CM | POA: Diagnosis not present

## 2022-12-22 DIAGNOSIS — M25662 Stiffness of left knee, not elsewhere classified: Secondary | ICD-10-CM | POA: Diagnosis not present

## 2022-12-22 DIAGNOSIS — I10 Essential (primary) hypertension: Secondary | ICD-10-CM | POA: Diagnosis not present

## 2022-12-22 DIAGNOSIS — M6281 Muscle weakness (generalized): Secondary | ICD-10-CM | POA: Diagnosis not present

## 2022-12-22 NOTE — Telephone Encounter (Signed)
Patient called to follow-up on test results. 

## 2022-12-22 NOTE — Telephone Encounter (Signed)
Patient calling in asking for lipid/hepatic panel results. Reviewed elevated triglycerides on labs from 12/19/22 and asked if he had been fasting for this lab work. Patient states he was not fasting for these labs. Forwarded patient response to T. Asa Lente.

## 2022-12-23 NOTE — Telephone Encounter (Signed)
Called to schedule repeat lipid labs for patient, no answer. Left detailed message per DPR asking patient to call our office so we can schedule him for fasting lipid labs.

## 2022-12-26 ENCOUNTER — Ambulatory Visit: Payer: 59 | Admitting: Podiatry

## 2022-12-26 DIAGNOSIS — M25652 Stiffness of left hip, not elsewhere classified: Secondary | ICD-10-CM | POA: Diagnosis not present

## 2022-12-26 DIAGNOSIS — M6281 Muscle weakness (generalized): Secondary | ICD-10-CM | POA: Diagnosis not present

## 2022-12-26 DIAGNOSIS — M25672 Stiffness of left ankle, not elsewhere classified: Secondary | ICD-10-CM | POA: Diagnosis not present

## 2022-12-26 DIAGNOSIS — M25662 Stiffness of left knee, not elsewhere classified: Secondary | ICD-10-CM | POA: Diagnosis not present

## 2022-12-26 DIAGNOSIS — I10 Essential (primary) hypertension: Secondary | ICD-10-CM | POA: Diagnosis not present

## 2022-12-26 NOTE — Telephone Encounter (Signed)
Left a message for the pt to call back.  

## 2022-12-26 NOTE — Telephone Encounter (Signed)
Patient is returning call and is requesting return call.  

## 2022-12-27 ENCOUNTER — Emergency Department (HOSPITAL_BASED_OUTPATIENT_CLINIC_OR_DEPARTMENT_OTHER)
Admission: EM | Admit: 2022-12-27 | Discharge: 2022-12-27 | Disposition: A | Discharge status: Home / Self Care | Payer: 59 | Attending: Emergency Medicine | Admitting: Emergency Medicine

## 2022-12-27 ENCOUNTER — Encounter: Payer: Self-pay | Admitting: Podiatry

## 2022-12-27 ENCOUNTER — Telehealth: Payer: Self-pay | Admitting: Cardiology

## 2022-12-27 ENCOUNTER — Encounter (HOSPITAL_BASED_OUTPATIENT_CLINIC_OR_DEPARTMENT_OTHER): Payer: Self-pay

## 2022-12-27 ENCOUNTER — Ambulatory Visit (INDEPENDENT_AMBULATORY_CARE_PROVIDER_SITE_OTHER): Payer: 59 | Admitting: Podiatry

## 2022-12-27 ENCOUNTER — Telehealth: Payer: Self-pay | Admitting: Home Health

## 2022-12-27 ENCOUNTER — Other Ambulatory Visit: Payer: Self-pay

## 2022-12-27 ENCOUNTER — Other Ambulatory Visit: Payer: Self-pay | Admitting: Home Health

## 2022-12-27 DIAGNOSIS — R350 Frequency of micturition: Secondary | ICD-10-CM | POA: Diagnosis not present

## 2022-12-27 DIAGNOSIS — Z7982 Long term (current) use of aspirin: Secondary | ICD-10-CM | POA: Diagnosis not present

## 2022-12-27 DIAGNOSIS — E782 Mixed hyperlipidemia: Secondary | ICD-10-CM

## 2022-12-27 DIAGNOSIS — G629 Polyneuropathy, unspecified: Secondary | ICD-10-CM

## 2022-12-27 DIAGNOSIS — M2042 Other hammer toe(s) (acquired), left foot: Secondary | ICD-10-CM | POA: Diagnosis not present

## 2022-12-27 DIAGNOSIS — M2041 Other hammer toe(s) (acquired), right foot: Secondary | ICD-10-CM

## 2022-12-27 LAB — BASIC METABOLIC PANEL
Anion gap: 10 (ref 5–15)
BUN: 21 mg/dL (ref 8–23)
CO2: 24 mmol/L (ref 22–32)
Calcium: 9.2 mg/dL (ref 8.9–10.3)
Chloride: 106 mmol/L (ref 98–111)
Creatinine, Ser: 1.07 mg/dL (ref 0.61–1.24)
GFR, Estimated: >60 mL/min (ref >60)
Glucose, Bld: 119 mg/dL — ABNORMAL HIGH (ref 70–99)
Potassium: 4 mmol/L (ref 3.5–5.1)
Sodium: 140 mmol/L (ref 135–145)

## 2022-12-27 LAB — CBC WITH DIFFERENTIAL/PLATELET
Abs Immature Granulocytes: 0.02 10*3/uL (ref 0.00–0.07)
Basophils Absolute: 0.1 10*3/uL (ref 0.0–0.1)
Basophils Relative: 1 %
Eosinophils Absolute: 0.1 10*3/uL (ref 0.0–0.5)
Eosinophils Relative: 2 %
HCT: 41.3 % (ref 39.0–52.0)
Hemoglobin: 14.4 g/dL (ref 13.0–17.0)
Immature Granulocytes: 0 %
Lymphocytes Relative: 36 %
Lymphs Abs: 2.3 10*3/uL (ref 0.7–4.0)
MCH: 31.9 pg (ref 26.0–34.0)
MCHC: 34.9 g/dL (ref 30.0–36.0)
MCV: 91.4 fL (ref 80.0–100.0)
Monocytes Absolute: 0.8 10*3/uL (ref 0.1–1.0)
Monocytes Relative: 12 %
Neutro Abs: 3.1 10*3/uL (ref 1.7–7.7)
Neutrophils Relative %: 49 %
Platelets: 217 10*3/uL (ref 150–400)
RBC: 4.52 MIL/uL (ref 4.22–5.81)
RDW: 12.2 % (ref 11.5–15.5)
WBC: 6.4 10*3/uL (ref 4.0–10.5)
nRBC: 0 % (ref 0.0–0.2)

## 2022-12-27 LAB — URINALYSIS, ROUTINE W REFLEX MICROSCOPIC
Bilirubin Urine: NEGATIVE
Glucose, UA: NEGATIVE mg/dL
Hgb urine dipstick: NEGATIVE
Ketones, ur: NEGATIVE mg/dL
Leukocytes,Ua: NEGATIVE
Nitrite: NEGATIVE
Protein, ur: NEGATIVE mg/dL
Specific Gravity, Urine: <1.005 — ABNORMAL LOW (ref 1.005–1.030)
pH: 6 (ref 5.0–8.0)

## 2022-12-27 NOTE — Telephone Encounter (Signed)
Patient called after hour line, reports he had lipid panel done 12/19/22 but was not fasting due to on many seizure meds. He is able to fast now and wants to know what to do. Re-ordered lipid panel. Advised the patient to complete fast lipid panel and we can review results on next step. There is no morning appointment at [address] office for next week for lab, advised the patient to call office tomorrow and see if any cancellation available, as he does not want to fast until afternoon. He has agreed.

## 2022-12-27 NOTE — Telephone Encounter (Signed)
Left message to call back  

## 2022-12-27 NOTE — ED Triage Notes (Signed)
Patient here POV from Home.  Endorses Frequency that began an hour ago. No Known Fevers. Mild Nausea. No Emesis or Diarrhea.   NAD Noted during triage. A&Ox4. GCS 15. Ambulatory.

## 2022-12-27 NOTE — Discharge Instructions (Signed)
You need to see your urologist for follow-up.  As we discussed your kidney function and urinalysis are normal today  Please restrict your fluid intake   Return to ER if you are dehydrated and have pain with urination

## 2022-12-27 NOTE — Telephone Encounter (Signed)
Pt was returning nurse call and is requesting a callback. Please advise. 

## 2022-12-27 NOTE — Progress Notes (Deleted)
12/28/2022 11:34 PM   Joshua [name] 19-Jan-1961 [id number]  Referring provider: Pediatrics, [facility] [address] [place],  [place] [address]  Urological history: 1. Prostate Cancer -PSA pending  -iPSA 3.9 -prostate MRI (2023) PI-RADS 4 in left peripheral zone mid gland/apex -Cognitive biopsy pathology (03/2022) for total cores positive for low risk Gleason score 3+3 equal 6 prostate cancer with 60% max core involvement -Currently on active surveillance -Will need confirmatory prostate biopsy in October 2024  2. BPH with LU TS -prostate volume 41 grams -Tamsulosin 0.4 mg daily  3. ED -Contributing factors of age, prostate cancer, BPH, hypertension, prediabetes, anxiety and history of smoking -tadalafil 5 mg daily    No chief complaint on file.   HPI: Joshua [name] is a 62 y.o. male who presents today for 6 month follow up.   I PSS 4/0  He has no urinary complaints at this time.  He continues tamsulosin 0.4 mg daily.  Patient denies any modifying or aggravating factors.  Patient denies any gross hematuria, dysuria or suprapubic/flank pain.  Patient denies any fevers, chills, nausea or vomiting.        Score:  1-7 Mild 8-19 Moderate 20-35 Severe     SHIM 25  Patient still having spontaneous erections.  He denies any pain or curvature with erections.  He is taking tadalafil 5 mg daily.     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   Meningioma (HCC)    Meningioma, recurrent of brain (HCC)  01/31/2019   Neuropathy 12/05/2019   Pre-diabetes    Rotator cuff tear    right   Seizures (HCC)    11/27/19 last sz 1 wk ago    Surgical History: Past Surgical History:  Procedure Laterality Date   APPLICATION OF CRANIAL NAVIGATION N/A 01/10/2019   Procedure: APPLICATION OF CRANIAL NAVIGATION;  Surgeon: Maeola Harman, MD;  Location: [facility] OR;  Service: Neurosurgery;  Laterality: N/A;   CATARACT EXTRACTION Right    CRANIOTOMY Right 11/06/2012   Procedure: CRANIOTOMY TUMOR EXCISION;  Surgeon: Maeola Harman, MD;  Location: [facility];  Service: Neurosurgery;  Laterality: Right;  Right Parasagittal craniotomy for meningioma with Stealth   CRANIOTOMY Right 01/10/2019   Procedure: Right Parasagittal Craniotomy for Tumor;  Surgeon: Maeola Harman, MD;  Location: [facility] OR;  Service: Neurosurgery;  Laterality: Right;  Right parasagittal craniotomy for tumor   ESOPHAGOGASTRODUODENOSCOPY     JOINT REPLACEMENT Right 2018   right hip   REVERSE SHOULDER ARTHROPLASTY Right 01/08/2021   Procedure: RIGHT REVERSE SHOULDER ARTHROPLASTY;  Surgeon: Cammy Copa, MD;  Location: [facility] OR;  Service: Orthopedics;  Laterality: Right;   right knee arthroscopy     SHOULDER ARTHROSCOPY Right 06/09/2020   Procedure: RIGHT SHOULDER ARTHROSCOPY AND DEBRIDEMENT;  Surgeon: Nadara Mustard, MD;  Location: [facility];  Service: Orthopedics;  Laterality: Right;    Home Medications:  Allergies as of 12/28/2022   No Known Allergies  Medication List        Accurate as of December 27, 2022 11:34 PM. If you have any questions, ask your nurse or doctor.          Accu-Chek Guide Me w/Device Kit Use to check blood sugar once daily.   Accu-Chek Guide test strip Generic drug: glucose blood Use to check blood sugar once daily.   Accu-Chek Softclix Lancets lancets Use as instructed   acetaminophen 500 MG tablet Commonly known as: TYLENOL Take 1,000 mg by mouth every 6 (six) hours as needed for  moderate pain or headache.   albuterol 108 (90 Base) MCG/ACT inhaler Commonly known as: VENTOLIN HFA Inhale 2 puffs into the lungs every 6 (six) hours as needed for wheezing or shortness of breath.   amLODipine 5 MG tablet Commonly known as: NORVASC TAKE 1 TABLET (5 MG TOTAL) BY MOUTH DAILY. TO LOWER BLOOD PRESSURE. STOP LOSARTAN   aspirin 81 MG chewable tablet Chew 81 mg by mouth daily.   atorvastatin 20 MG tablet Commonly known as: LIPITOR TAKE 1 TABLET (20 MG TOTAL) BY MOUTH DAILY.   clonazePAM 0.5 MG tablet Commonly known as: KLONOPIN TAKE 1 TABLET (0.5 MG TOTAL) BY MOUTH AT BEDTIME.   diazepam 2 MG tablet Commonly known as: VALIUM Take 1 tablet (2 mg total) by mouth 2 (two) times daily.   ketoconazole 2 % cream Commonly known as: NIZORAL APPLY 1 APPLICATION. TOPICALLY DAILY.   Lacosamide 100 MG Tabs TAKE 1 TABLET (100 MG TOTAL) BY MOUTH IN THE MORNING AND AT BEDTIME.   lamoTRIgine 200 MG tablet Commonly known as: LAMICTAL TAKE 1 TABLET (200 MG TOTAL) BY MOUTH 2 (TWO) TIMES DAILY.   lidocaine 5 % Commonly known as: Lidoderm Place 1 patch onto the skin daily. Remove & Discard patch within 12 hours or as directed by MD   meclizine 25 MG tablet Commonly known as: ANTIVERT Take 1 tablet (25 mg total) by mouth 3 (three) times daily as needed for dizziness.   metFORMIN 500 MG tablet Commonly known as: GLUCOPHAGE Take 1 tablet (500 mg total) by mouth 2 (two) times daily.   methocarbamol 750 MG tablet Commonly known as: ROBAXIN Take 750 mg by mouth daily as needed for muscle spasms.   omeprazole 20 MG capsule Commonly known as: PRILOSEC Take 20 mg by mouth daily.   propranolol 10 MG tablet Commonly known as: INDERAL Take 1 tablet (10 mg total) by mouth 3 (three) times daily as needed (palpitations).   tadalafil 5 MG tablet Commonly known as: CIALIS Take 1 tablet (5 mg total) by mouth daily as needed for erectile dysfunction. 1 hour prior to sexual activity    tamsulosin 0.4 MG Caps capsule Commonly known as: FLOMAX Take 1 capsule (0.4 mg total) by mouth daily.   VITAMIN A PO Take 1 capsule by mouth daily.        Allergies: No Known Allergies  Family History: Family History  Problem Relation Age of Onset   Hypertension Mother    Dementia Mother    Diabetes Mother    Hypertension Father    CAD Father    Diabetes Father    CAD Brother     Social History:  reports that he quit smoking about 3 years ago. His smoking use included cigarettes. He has a 0.15 pack-year smoking history. He has been exposed to tobacco smoke. He has never used smokeless tobacco. He reports current alcohol use. He reports that he does not use drugs.  ROS:  Pertinent ROS in HPI  Physical Exam: There were no vitals taken for this visit.  Constitutional:  Well nourished. Alert and oriented, No acute distress. HEENT: [place] AT, moist mucus membranes.  Trachea midline Cardiovascular: No clubbing, cyanosis, or edema. Respiratory: Normal respiratory effort, no increased work of breathing. GU: No CVA tenderness.  No bladder fullness or masses.  Patient with uncircumcised phallus. Foreskin easily retracted  Urethral meatus is patent.  No penile discharge. No penile lesions or rashes. Scrotum without lesions, cysts, rashes and/or edema.  Testicles are located scrotally bilaterally. No masses are appreciated in the testicles. Left and right epididymis are normal. Rectal: Patient with  normal sphincter tone. Anus and perineum without scarring or rashes. No rectal masses are appreciated. Prostate is approximately 50 grams, no nodules are appreciated. Seminal vesicles could not be palpated.  Neurologic: Grossly intact, no focal deficits, moving all 4 extremities. Psychiatric: Normal mood and affect.   Laboratory Data: Lab Results  Component Value Date   WBC 6.4 12/27/2022   HGB 14.4 12/27/2022   HCT 41.3 12/27/2022   MCV 91.4 12/27/2022   PLT 217 12/27/2022    Lab  Results  Component Value Date   CREATININE 1.07 12/27/2022    Lab Results  Component Value Date   AST 24 12/19/2022   Lab Results  Component Value Date   ALT 28 12/19/2022  I have reviewed the labs.   Pertinent Imaging: N/A   Assessment & Plan:    1. Prostate cancer -low grade -on active surveillance -will need confirmatory biopsy in October  2. BPH with LU TS -No complaints -continue tamsulosin 0.4 mg daily  3. ED -No complaints -continue tadalafil 5 mg daily   No follow-ups on file.  These notes generated with voice recognition software. I apologize for typographical errors.  Cloretta Ned  [facility] [address]  [address] [place], [place] [address] [phone]

## 2022-12-27 NOTE — Progress Notes (Signed)
  Subjective:  Patient ID: Joshua [name], male    DOB: Jul 10, 1961,   MRN: [id number]  Chief Complaint  Patient presents with   Callouses    Left foot second toe callus pain    62 y.o. male presents for concern of left second toe. Patient is pre-diabetic and on metformin and wants to make sure his feet are doing well. Relates continuing to get callus on left second toe. Dealling with hallux limitis on the left.  Denies any other pedal complaints. Denies n/v/f/c.   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 CFT <3 seconds. Normal hair growth on digits. No edema.  Skin. No lacerations or abrasions bilateral feet. Hyperkeratosis noted to distal second digit.  Musculoskeletal: MMT 5/5 bilateral lower extremities in DF, PF, Inversion and Eversion. Deceased ROM in DF of ankle joint. Hammered second digit and adductovarus of fifth digit. Spurring noted to the dorsum of the first MPJ and minmal ROM of the first MPJ with mild HAV deformity notd. Right second digit Rigid deformity noted at distal IPJ.  Neurological: Sensation intact to light touch.   Assessment:   1. Hammertoe, bilateral   2. Neuropathy       Plan:  Patient was evaluated and treated and all questions answered. -Educated on hammertoes and treatment options  -Discussed padding including toe caps and crest pads.  -Discussed  need for potential surgery as pain has continued patient is ready to consider. Discussed hammertoe repair of second digit and perioperative course. Briefly discussed hallux limitus surgery but patient would like to just focus on the second toe Discussed hallux limitus as well and treatmnet options patient may be considering surgery in future.  -Informed surgical risk consent was reviewed and read aloud to the patient.  I reviewed the films.  I have discussed my findings with the patient in great detail.  I have discussed all risks including but not limited to infection, stiffness, scarring, limp, disability, deformity, damage to blood vessels and nerves, numbness, poor healing, need for braces, arthritis, chronic pain, amputation, death.  All benefits and realistic expectations discussed in great detail.  I have made no promises as to the outcome.  I have provided realistic expectations.  I have offered the patient a 2nd opinion, which they have declined and assured me they preferred to proceed despite the risks. Plan for surgery end of July.     Louann Sjogren, DPM

## 2022-12-27 NOTE — ED Provider Notes (Signed)
[place] EMERGENCY DEPARTMENT AT [facility] Provider Note   CSN: [id number] Arrival date & time: 12/27/22  2136     History  Chief Complaint  Patient presents with   Urinary Frequency    Joshua [name] is a 62 y.o. male history of enlarged prostate here presenting with urinary frequency.  Patient states that he started having urinary frequency earlier today.  He states that he urinated for 5 times this evening.  Patient is not on any diuretics.   The history is provided by the patient.       Home Medications Prior to Admission medications   Medication Sig Start Date End Date Taking? Authorizing Provider  Accu-Chek Softclix Lancets lancets Use as instructed 08/13/21   Hoy Register, MD  acetaminophen (TYLENOL) 500 MG tablet Take 1,000 mg by mouth every 6 (six) hours as needed for moderate pain or headache.    [provider]  albuterol (VENTOLIN HFA) 108 (90 Base) MCG/ACT inhaler Inhale 2 puffs into the lungs every 6 (six) hours as needed for wheezing or shortness of breath. 08/13/21   Newlin, Enobong, MD  amLODipine (NORVASC) 5 MG tablet TAKE 1 TABLET (5 MG TOTAL) BY MOUTH DAILY. TO LOWER BLOOD PRESSURE. STOP LOSARTAN 08/13/21   Hoy Register, MD  aspirin 81 MG chewable tablet Chew 81 mg by mouth daily.    [provider]  atorvastatin (LIPITOR) 20 MG tablet TAKE 1 TABLET (20 MG TOTAL) BY MOUTH DAILY. 12/08/21   Hoy Register, MD  Blood Glucose Monitoring Suppl (ACCU-CHEK GUIDE ME) w/Device KIT Use to check blood sugar once daily. 01/07/21   Hoy Register, MD  clonazePAM (KLONOPIN) 0.5 MG tablet TAKE 1 TABLET (0.5 MG TOTAL) BY MOUTH AT BEDTIME. 12/08/22   Vaslow, Georgeanna Lea, MD  diazepam (VALIUM) 2 MG tablet Take 1 tablet (2 mg total) by mouth 2 (two) times daily. 08/15/21   Wynetta Fines, MD  glucose blood (ACCU-CHEK GUIDE) test strip Use to check blood sugar once daily. 08/13/21   Hoy Register, MD  ketoconazole (NIZORAL) 2 % cream APPLY 1  APPLICATION. TOPICALLY DAILY. 02/28/22   Louann Sjogren, DPM  Lacosamide 100 MG TABS TAKE 1 TABLET (100 MG TOTAL) BY MOUTH IN THE MORNING AND AT BEDTIME. 12/08/22   Vaslow, Georgeanna Lea, MD  lamoTRIgine (LAMICTAL) 200 MG tablet TAKE 1 TABLET (200 MG TOTAL) BY MOUTH 2 (TWO) TIMES DAILY. 09/21/22   Vaslow, Georgeanna Lea, MD  lidocaine (LIDODERM) 5 % Place 1 patch onto the skin daily. Remove & Discard patch within 12 hours or as directed by MD 04/07/22   Marita Kansas, PA-C  meclizine (ANTIVERT) 25 MG tablet Take 1 tablet (25 mg total) by mouth 3 (three) times daily as needed for dizziness. 01/04/22   Horton, Clabe Seal, DO  metFORMIN (GLUCOPHAGE) 500 MG tablet Take 1 tablet (500 mg total) by mouth 2 (two) times daily. 08/13/21   Hoy Register, MD  methocarbamol (ROBAXIN) 750 MG tablet Take 750 mg by mouth daily as needed for muscle spasms.    [provider]  omeprazole (PRILOSEC) 20 MG capsule Take 20 mg by mouth daily. 08/29/22   [provider]  propranolol (INDERAL) 10 MG tablet Take 1 tablet (10 mg total) by mouth 3 (three) times daily as needed (palpitations). 05/31/22   Swinyer, Zachary George, NP  tadalafil (CIALIS) 5 MG tablet Take 1 tablet (5 mg total) by mouth daily as needed for erectile dysfunction. 1 hour prior to sexual activity 01/17/22   Richardo Hanks,  Laurette Schimke, MD  tamsulosin (FLOMAX) 0.4 MG CAPS capsule Take 1 capsule (0.4 mg total) by mouth daily. 11/23/21   Michiel Cowboy A, PA-C  VITAMIN A PO Take 1 capsule by mouth daily.    [provider]      Allergies    Patient has no known allergies.    Review of Systems   Review of Systems  Genitourinary:  Positive for frequency.  All other systems reviewed and are negative.   Physical Exam Updated Vital Signs BP 122/81 (BP Location: Right Arm)   Pulse 66   Temp 98.2 F (36.8 C) (Oral)   Resp 12   Ht 5\' 9"  (1.753 m)   Wt 79.4 kg   SpO2 96%   BMI 25.84 kg/m  Physical Exam Vitals and nursing note reviewed.  HENT:      Head: Normocephalic.     Nose: Nose normal.  Cardiovascular:     Rate and Rhythm: Normal rate and regular rhythm.     Pulses: Normal pulses.     Heart sounds: Normal heart sounds.  Pulmonary:     Effort: Pulmonary effort is normal.     Breath sounds: Normal breath sounds.  Abdominal:     General: Abdomen is flat.     Palpations: Abdomen is soft.  Musculoskeletal:        General: Normal range of motion.  Skin:    General: Skin is warm.     Capillary Refill: Capillary refill takes less than 2 seconds.  Neurological:     General: No focal deficit present.     Mental Status: He is alert and oriented to person, place, and time.  Psychiatric:        Mood and Affect: Mood normal.        Behavior: Behavior normal.     ED Results / Procedures / Treatments   Labs (all labs ordered are listed, but only abnormal results are displayed) Labs Reviewed  URINALYSIS, ROUTINE W REFLEX MICROSCOPIC - Abnormal; Notable for the following components:      Result Value   Color, Urine COLORLESS (*)    Specific Gravity, Urine <1.005 (*)    All other components within normal limits  BASIC METABOLIC PANEL - Abnormal; Notable for the following components:   Glucose, Bld 119 (*)    All other components within normal limits  CBC WITH DIFFERENTIAL/PLATELET    EKG None  Radiology No results found.  Procedures Procedures    Medications Ordered in ED Medications - No data to display  ED Course/ Medical Decision Making/ A&P                             Medical Decision Making Joshua [name] is a 62 y.o. male here presenting with urinary frequency.  I reviewed patient's kidney function is normal.  Patient's urinalysis is unremarkable.  Patient does not have a UTI.  I think symptoms likely from BPH.  Will have patient follow-up with urology outpatient.  Problems Addressed: Urinary frequency: acute illness or injury  Amount and/or Complexity of Data Reviewed Labs: ordered. Decision-making  details documented in ED Course.    Final Clinical Impression(s) / ED Diagnoses Final diagnoses:  None    Rx / DC Orders ED Discharge Orders     None         Charlynne Pander, MD 12/27/22 2244

## 2022-12-27 NOTE — ED Notes (Signed)
Reviewed AVS with patient, patient expressed understanding of directions, denies further questions at this time. 

## 2022-12-28 ENCOUNTER — Ambulatory Visit: Payer: 59 | Admitting: Urology

## 2022-12-29 ENCOUNTER — Ambulatory Visit: Payer: 59 | Attending: Physician Assistant

## 2022-12-30 NOTE — Telephone Encounter (Signed)
Returned call to patient and left message stating that we were attempting to return his call again and if he still needed [place], to call office back. Will close this encounter and address if pt returns call to office.

## 2023-01-03 ENCOUNTER — Ambulatory Visit: Payer: 59 | Attending: Physician Assistant

## 2023-01-03 DIAGNOSIS — E782 Mixed hyperlipidemia: Secondary | ICD-10-CM | POA: Diagnosis not present

## 2023-01-03 LAB — LIPID PANEL
Chol/HDL Ratio: 2 ratio (ref 0.0–5.0)
Cholesterol, Total: 134 mg/dL (ref 100–199)
HDL: 67 mg/dL (ref >39)
LDL Chol Calc (NIH): 48 mg/dL (ref 0–99)
Triglycerides: 103 mg/dL (ref 0–149)
VLDL Cholesterol Cal: 19 mg/dL (ref 5–40)

## 2023-01-04 ENCOUNTER — Other Ambulatory Visit: Payer: 59 | Admitting: Urology

## 2023-01-11 ENCOUNTER — Other Ambulatory Visit (INDEPENDENT_AMBULATORY_CARE_PROVIDER_SITE_OTHER): Payer: 59

## 2023-01-11 ENCOUNTER — Ambulatory Visit (INDEPENDENT_AMBULATORY_CARE_PROVIDER_SITE_OTHER): Payer: 59 | Admitting: Surgical

## 2023-01-11 DIAGNOSIS — M545 Low back pain, unspecified: Secondary | ICD-10-CM

## 2023-01-11 DIAGNOSIS — M5442 Lumbago with sciatica, left side: Secondary | ICD-10-CM | POA: Diagnosis not present

## 2023-01-11 MED ORDER — PREDNISONE 10 MG (21) PO TBPK: ORAL_TABLET | ORAL | 0 refills | Status: DC

## 2023-01-12 ENCOUNTER — Encounter: Payer: Self-pay | Admitting: Surgical

## 2023-01-12 NOTE — Progress Notes (Signed)
Office Visit Note   Patient: Joshua [name]           Date of Birth: [birth year]           MRN: [id number] Visit Date: 01/11/2023 Requested by: Pediatrics, [facility] [address] [place],  [place] [address] PCP: Pediatrics, [facility]  Subjective: Chief Complaint  Patient presents with   Lower Back - Routine Post Op    HPI: KEANDRE LINDEN is a 62 y.o. male who presents to the office reporting low back pain.  Patient states that he fell onto his butt in May.  Ever since then he has had increased low back pain with radiation of pain into the lateral left hip and down to the lateral aspect of the knee.  He denies any right leg radicular symptoms.  Feels that the pain in his low back is the worst out of the pain distribution that he has.  Pain is worsening and it has gotten to the point where he really cannot sleep.  He feels stiffness in his back that is worst in the morning.  He has tried home exercise program at the [facility] as well as ice/heat/over-the-counter medications without any relief.  He has no groin pain.  No history of prior left hip surgery but he does have a history of prior right hip replacement.  No prior back surgery.  No red flag symptoms such as bowel/bladder incontinence or saddle anesthesia.  Does have a history of chronic left leg weakness stemming from a tumor that was removed from his brain by his history..                ROS: All systems reviewed are negative as they relate to the chief complaint within the history of present illness.  Patient denies fevers or chills.  Assessment & Plan: Visit Diagnoses:  1. Acute left-sided low back pain with left-sided sciatica     Plan: Patient is a 62 year old male who presents for evaluation of low back pain.  Has worsening low back pain that has progressed to the point where he is barely getting any sleep.  No help from a home exercise program at the [facility] or any medications that he has tried or  heat/ice.  This all stems from an injury back in May where he fell on his buttocks.  No history of prior back surgery but lumbar spine radiographs taken today demonstrate significant pathology throughout the lumbar spine with bridging osteophytes and does have grade 1 spondylolisthesis at L5-S1.  Plan for further evaluation of source of low back pain with MRI of the lumbar spine.  Will also give him steroid Dosepak prescription today for temporary relief though I suspect that this relief may be short-lived.  Follow-up after MRI to review results  Follow-Up Instructions: No follow-ups on file.   Orders:  Orders Placed This Encounter  Procedures   XR Lumbar Spine 2-3 Views   MR Lumbar Spine w/o contrast   Meds ordered this encounter  Medications   predniSONE (STERAPRED UNI-PAK 21 TAB) 10 MG (21) TBPK tablet    Sig: Take as directed on package    Dispense:  21 tablet    Refill:  0      Procedures: No procedures performed   Clinical Data: No additional findings.  Objective: Vital Signs: There were no vitals taken for this visit.  Physical Exam:  Constitutional: Patient appears well-developed HEENT:  Head: Normocephalic Eyes:EOM are normal Neck:  Normal range of motion Cardiovascular: Normal rate Pulmonary/chest: Effort normal Neurologic: Patient is alert Skin: Skin is warm Psychiatric: Patient has normal mood and affect  Ortho Exam: Ortho exam demonstrates severe pain with tenderness to the low back diffusely through the lumbar spine.  He has severe increase in his pain with any flexion or extension of the spine.  He very gingerly sits down into the exam table and lays down on his back for exam.  He has no pain in his groin with hip range of motion on the left side but this does cause severe discomfort in his low back and buttock.  No pain with logroll of the left hip.  Negative Stinchfield sign.  He has intact hip flexion, quadricep, hamstring, dorsiflexion, plantarflexion, EHL  rated 5/5.  No clonus bilaterally.  Specialty Comments:  MRI CERVICAL SPINE WITHOUT AND WITH CONTRAST   TECHNIQUE: Multiplanar and multiecho pulse sequences of the cervical spine, to include the craniocervical junction and cervicothoracic junction, were obtained without and with intravenous contrast.   CONTRAST:  7.39mL GADAVIST GADOBUTROL 1 MMOL/ML IV SOLN   COMPARISON:  No prior MRI, correlation is made with CT cervical spine 04/07/2022   FINDINGS: Alignment: Trace retrolisthesis of C3 on C4 and C4 on C5, unchanged.   Vertebrae: No acute fracture or suspicious osseous lesion. No abnormal enhancement.   Cord: Normal signal and morphology.  No abnormal enhancement.   Posterior Fossa, vertebral arteries, paraspinal tissues: Negative.   Disc levels:   C2-C3: No significant disc bulge. Mild facet arthropathy. No spinal canal stenosis or neural foraminal narrowing.   C3-C4: Minimal disc bulge. Left-greater-than-right uncovertebral and facet arthropathy. Ligamentum flavum hypertrophy. Moderate spinal canal stenosis. Moderate right and severe left neural foraminal narrowing.   C4-C5: Mild disc bulge. Facet and uncovertebral hypertrophy. Moderate spinal canal stenosis. Severe bilateral neural foraminal narrowing.   C5-C6: Disc bulge with left subarticular protrusion. Facet arthropathy. No spinal canal stenosis. Mild right and moderate left neural foraminal narrowing.   C6-C7: Mild disc bulge. Facet and uncovertebral hypertrophy. Mild spinal canal stenosis. Severe left and moderate to severe right neural foraminal narrowing.   C7-T1: No significant disc bulge. No spinal canal stenosis or neuroforaminal narrowing.   IMPRESSION: 1. C4-C5 moderate spinal canal stenosis and severe bilateral neural foraminal narrowing. 2. C3-C4 moderate spinal canal stenosis with severe left and moderate right neural foraminal narrowing. 3. C6-C7 mild spinal canal stenosis with severe left  and moderate to severe right neural foraminal narrowing. 4. C5-C6 moderate left and mild right neural foraminal narrowing.     Electronically Signed   By: Wiliam Ke M.D.   On: 06/09/2022 00:48  Imaging: No results found.   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   Seizures (HCC) 01/31/2019   Meningioma, recurrent of brain (HCC) 01/31/2019   Meningioma (HCC) 01/03/2019   Diffuse idiopathic skeletal hyperostosis 06/05/2013   Past Medical History:  Diagnosis Date   Anxiety    Brain tumor (HCC)  02/20/2013   brain tumor removed in March 2014, Dr Rush Farmer   Diffuse idiopathic skeletal hyperostosis 06/05/2013   Dizziness    Enlarged prostate    GERD (gastroesophageal reflux disease)    Headache(784.0)    scattered   High cholesterol    Hypertension    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   Meningioma (HCC)    Meningioma, recurrent of brain (HCC) 01/31/2019   Neuropathy 12/05/2019   Pre-diabetes    Rotator cuff tear    right   Seizures (HCC)    11/27/19 last sz 1 wk ago    Family History  Problem Relation Age of Onset   Hypertension Mother    Dementia Mother    Diabetes Mother    Hypertension Father    CAD Father    Diabetes Father    CAD Brother     Past Surgical History:  Procedure Laterality Date   APPLICATION OF CRANIAL NAVIGATION N/A 01/10/2019   Procedure: APPLICATION OF CRANIAL NAVIGATION;  Surgeon: Maeola Harman, MD;   [facility];  Service: Neurosurgery;  Laterality: N/A;   CATARACT EXTRACTION Right    CRANIOTOMY Right 11/06/2012   Procedure: CRANIOTOMY TUMOR EXCISION;  Surgeon: Maeola Harman, MD;  Location: [facility];  Service: Neurosurgery;  Laterality: Right;  Right Parasagittal craniotomy for meningioma with Stealth   CRANIOTOMY Right 01/10/2019   Procedure: Right Parasagittal Craniotomy for Tumor;  Surgeon: Maeola Harman, MD;  Location: [facility];  Service: Neurosurgery;  Laterality: Right;  Right parasagittal craniotomy for tumor   ESOPHAGOGASTRODUODENOSCOPY     JOINT REPLACEMENT Right 2018   right hip   REVERSE SHOULDER ARTHROPLASTY Right 01/08/2021   Procedure: RIGHT REVERSE SHOULDER ARTHROPLASTY;  Surgeon: Cammy Copa, MD;  Location: [facility] OR;  Service: Orthopedics;  Laterality: Right;   right knee arthroscopy     SHOULDER ARTHROSCOPY Right 06/09/2020   Procedure: RIGHT SHOULDER ARTHROSCOPY AND DEBRIDEMENT;  Surgeon: Nadara Mustard, MD;  Location: [facility];  Service: Orthopedics;  Laterality: Right;   Social History   Occupational History   Not on file  Tobacco Use   Smoking status: Former    Packs/day: 0.01    Years: 15.00    Additional pack years: 0.00    Total pack years: 0.15    Types: Cigarettes    Quit date: 01/29/2019    Years since quitting: 3.9    Passive exposure: Past   Smokeless tobacco: Never   Tobacco comments:    weekend smoker  Vaping Use   Vaping Use: Some days   Substances: Nicotine, Flavoring  Substance and Sexual Activity   Alcohol use: Yes    Comment: social   Drug use: No   Sexual activity: Not Currently

## 2023-01-13 ENCOUNTER — Telehealth: Payer: Self-pay | Admitting: Urology

## 2023-01-13 NOTE — Telephone Encounter (Signed)
DOS  - 02/14/23  HAMMERTOE REPAIR 2ND LEFT --- [id number]  [facility] EFFECTIVE DATE 09/16/22  DEDUCTIBLE -  OOP -  COINSURANCE -    PER [employer] WEBSITE FOR CPT CODE 47829 Notification or Prior Authorization is not required for the requested services You are not required to submit a notification/prior authorization based on the information provided. If you have general questions about the prior authorization requirements, visit [email or web address] > Clinician Resources > Advance and Admission Notification Requirements. The number above acknowledges your notification. Please write this reference number down for future reference. If you would like to request an organization determination, please call us at [phone].  Decision ID #: [id number]

## 2023-01-14 ENCOUNTER — Emergency Department: Payer: 59

## 2023-01-14 ENCOUNTER — Other Ambulatory Visit: Payer: Self-pay

## 2023-01-14 ENCOUNTER — Emergency Department
Admission: EM | Admit: 2023-01-14 | Discharge: 2023-01-14 | Disposition: A | Discharge status: Home / Self Care | Payer: 59 | Attending: Emergency Medicine | Admitting: Emergency Medicine

## 2023-01-14 DIAGNOSIS — M549 Dorsalgia, unspecified: Secondary | ICD-10-CM | POA: Diagnosis not present

## 2023-01-14 DIAGNOSIS — R109 Unspecified abdominal pain: Secondary | ICD-10-CM | POA: Diagnosis not present

## 2023-01-14 DIAGNOSIS — Z743 Need for continuous supervision: Secondary | ICD-10-CM | POA: Diagnosis not present

## 2023-01-14 DIAGNOSIS — K429 Umbilical hernia without obstruction or gangrene: Secondary | ICD-10-CM | POA: Diagnosis not present

## 2023-01-14 LAB — CBC
HCT: 44.7 % (ref 39.0–52.0)
Hemoglobin: 15.4 g/dL (ref 13.0–17.0)
MCH: 32.2 pg (ref 26.0–34.0)
MCHC: 34.5 g/dL (ref 30.0–36.0)
MCV: 93.5 fL (ref 80.0–100.0)
Platelets: 265 10*3/uL (ref 150–400)
RBC: 4.78 MIL/uL (ref 4.22–5.81)
RDW: 12.4 % (ref 11.5–15.5)
WBC: 13 10*3/uL — ABNORMAL HIGH (ref 4.0–10.5)
nRBC: 0 % (ref 0.0–0.2)

## 2023-01-14 LAB — URINALYSIS, ROUTINE W REFLEX MICROSCOPIC
Bacteria, UA: NONE SEEN
Bilirubin Urine: NEGATIVE
Glucose, UA: NEGATIVE mg/dL
Hgb urine dipstick: NEGATIVE
Ketones, ur: NEGATIVE mg/dL
Leukocytes,Ua: NEGATIVE
Nitrite: NEGATIVE
Protein, ur: 100 mg/dL — AB
Specific Gravity, Urine: 1.015 (ref 1.005–1.030)
pH: 5 (ref 5.0–8.0)

## 2023-01-14 LAB — BASIC METABOLIC PANEL
Anion gap: 10 (ref 5–15)
BUN: 15 mg/dL (ref 8–23)
CO2: 24 mmol/L (ref 22–32)
Calcium: 9.2 mg/dL (ref 8.9–10.3)
Chloride: 106 mmol/L (ref 98–111)
Creatinine, Ser: 0.87 mg/dL (ref 0.61–1.24)
GFR, Estimated: >60 mL/min (ref >60)
Glucose, Bld: 118 mg/dL — ABNORMAL HIGH (ref 70–99)
Potassium: 3.7 mmol/L (ref 3.5–5.1)
Sodium: 140 mmol/L (ref 135–145)

## 2023-01-14 MED ORDER — KETOROLAC TROMETHAMINE 30 MG/ML IJ SOLN
30.0000 mg | Freq: Once | INTRAMUSCULAR | Status: AC
  Administered 2023-01-14: 30 mg via INTRAMUSCULAR
  Filled 2023-01-14: qty 1

## 2023-01-14 MED ORDER — ACETAMINOPHEN 500 MG PO TABS
1000.0000 mg | ORAL_TABLET | Freq: Once | ORAL | Status: AC
  Administered 2023-01-14: 1000 mg via ORAL
  Filled 2023-01-14: qty 2

## 2023-01-14 NOTE — ED Triage Notes (Signed)
Pt to ed from home via [facility] for flank pain. Pt was just seen for same recently. Pt is caox4, in no acute distress in triage.

## 2023-01-14 NOTE — Discharge Instructions (Signed)
Use Tylenol for pain and fevers.  Up to 1000 mg per dose, up to 4 times per day.  Do not take more than 4000 mg of Tylenol/acetaminophen within 24 hours..  Use naproxen/Aleve for anti-inflammatory pain relief. Use up to 500mg every 12 hours. Do not take more frequently than this. Do not use other NSAIDs (ibuprofen, Advil) while taking this medication. It is safe to take Tylenol with this.   

## 2023-01-14 NOTE — ED Provider Notes (Signed)
[facility] Provider Note    Event Date/Time   First MD Initiated Contact with Patient 01/14/23 0304     (approximate)   History   Flank Pain (X 2 weeks)   HPI  Joshua [name] is a 62 y.o. male who presents to the ED for evaluation of Flank Pain (X 2 weeks)   Patient presents with bilateral flank discomfort that has been intermittent over the past 1-2 months.  Bilateral and without coexisting symptoms.  No falls or trauma, urinary symptoms, stool changes, emesis, rash   Physical Exam   Triage Vital Signs: ED Triage Vitals [01/14/23 0224]  Enc Vitals Group     BP 125/85     Pulse Rate 78     Resp 16     Temp 97.7 F (36.5 C)     Temp Source Oral     SpO2 98 %     Weight      Height      Head Circumference      Peak Flow      Pain Score 10     Pain Loc      Pain Edu?      Excl. in GC?     Most recent vital signs: Vitals:   01/14/23 0224 01/14/23 0611  BP: 125/85 132/82  Pulse: 78 69  Resp: 16 18  Temp: 97.7 F (36.5 C) 98 F (36.7 C)  SpO2: 98% 100%    General: Awake, no distress.  CV:  Good peripheral perfusion.  Resp:  Normal effort.  Abd:  No distention.  Soft and benign MSK:  No deformity noted.  Poorly localizing and mild bilateral flank tenderness radiating from his lower back.  No overlying skin changes. Neuro:  No focal deficits appreciated. Other:     ED Results / Procedures / Treatments   Labs (all labs ordered are listed, but only abnormal results are displayed) Labs Reviewed  URINALYSIS, ROUTINE W REFLEX MICROSCOPIC - Abnormal; Notable for the following components:      Result Value   Color, Urine YELLOW (*)    APPearance CLEAR (*)    Protein, ur 100 (*)    All other components within normal limits  BASIC METABOLIC PANEL - Abnormal; Notable for the following components:   Glucose, Bld 118 (*)    All other components within normal limits  CBC - Abnormal; Notable for the following components:   WBC 13.0  (*)    All other components within normal limits    EKG   RADIOLOGY CT renal study interpreted by me without evidence of acute pathology.  Official radiology report(s): CT Renal Stone Study  Result Date: 01/14/2023 CLINICAL DATA:  62 year old male with flank pain. EXAM: CT ABDOMEN AND PELVIS WITHOUT CONTRAST TECHNIQUE: Multidetector CT imaging of the abdomen and pelvis was performed following the standard protocol without IV contrast. RADIATION DOSE REDUCTION: This exam was performed according to the departmental dose-optimization program which includes automated exposure control, adjustment of the mA and/or kV according to patient size and/or use of iterative reconstruction technique. COMPARISON:  Noncontrast CT Abdomen and Pelvis 11/18/2020. FINDINGS: Lower chest: Negative. Hepatobiliary: Negative noncontrast liver and gallbladder today. Pancreas: Negative. Spleen: Negative. Adrenals/Urinary Tract: Chronic small left adrenal gland lateral limb nodule is stable since 2021 and low-density consistent with benign adenoma (no follow-up imaging recommended). Right adrenal gland remains normal. Nonobstructed kidneys. No nephrolithiasis or evidence of acute renal inflammation. Both ureters are decompressed and normal to the bladder.  Chronic pelvic phleboliths. Unremarkable bladder when allowing for right hip arthroplasty streak artifact. Stomach/Bowel: Negative large bowel. Normal appendix on series 2, image 54. No dilated small bowel, and most small bowel loops are decompressed. Stomach and duodenum also fairly decompressed. No free air, free fluid, or mesenteric inflammation identified. Small fat containing umbilical hernia is stable. Vascular/Lymphatic: Aortoiliac calcified atherosclerosis. Normal caliber abdominal aorta. Vascular patency is not evaluated in the absence of IV contrast. No lymphadenopathy. Reproductive: Negative. Other: No pelvis free fluid. Musculoskeletal: Multilevel lower thoracic  interbody ankylosis from flowing endplate osteophytes, Diffuse idiopathic skeletal hyperostosis (DISH). Ankylosis through the T11 level. And similar right lateral flowing lumbar endplate osteophytes with L1 through L4 ankylosis. Questionable T12 ankylosis related to bulky left far lateral osteophyte there. Similar degenerative osteophyte at L4-L5 without bridging bone. Chronic disc and endplate degeneration at L5-S1 with increased vacuum disc there since 2022. Chronic right hip arthroplasty. Chronic left hip degeneration. No acute osseous abnormality identified. IMPRESSION: 1. No urinary calculus or obstructive uropathy. No acute or inflammatory process identified in the non-contrast abdomen or pelvis. 2. Spinal Diffuse idiopathic skeletal hyperostosis (DISH), with widespread but incomplete lower thoracic and lumbar spinal ankylosis. Advanced disc degeneration at the lumbosacral junction. 3.  Aortic Atherosclerosis (ICD10-I70.0). Electronically Signed   By: Odessa Fleming M.D.   On: 01/14/2023 05:00    PROCEDURES and INTERVENTIONS:  Procedures  Medications  acetaminophen (TYLENOL) tablet 1,000 mg (1,000 mg Oral Given 01/14/23 0439)  ketorolac (TORADOL) 30 MG/ML injection 30 mg (30 mg Intramuscular Given 01/14/23 0439)     IMPRESSION / MDM / ASSESSMENT AND PLAN / ED COURSE  I reviewed the triage vital signs and the nursing notes.  Differential diagnosis includes, but is not limited to, ureteral colic, MSK pain, shingles, diverticulitis  {Patient presents with symptoms of an acute illness or injury that is potentially life-threatening.  Patient presents with bilateral intermittent subacute flank pain that is likely MSK in etiology and suitable for outpatient management.  Look systemically well with benign abdomen and normal vitals.  Blood work with normal metabolic panel.  CBC with marginal leukocytosis but I doubt infectious etiology of his symptoms.  Urine is clear and CT renal study without evidence of  acute derangements.  Pain resolved with Toradol.  Suitable for trial of outpatient management.  We discussed return precautions.  Clinical Course as of 01/14/23 0716  Sat Jan 14, 2023  0541 Reassessed. Feeling much better [DS]    Clinical Course User Index [DS] Delton Prairie, MD     FINAL CLINICAL IMPRESSION(S) / ED DIAGNOSES   Final diagnoses:  Flank pain     Rx / DC Orders   ED Discharge Orders     None        Note:  This document was prepared using Dragon voice recognition software and may include unintentional dictation errors.   Delton Prairie, MD 01/14/23 [phone]

## 2023-01-17 ENCOUNTER — Emergency Department (HOSPITAL_COMMUNITY)
Admission: EM | Admit: 2023-01-17 | Discharge: 2023-01-18 | Disposition: A | Discharge status: Home / Self Care | Payer: 59 | Attending: Emergency Medicine | Admitting: Emergency Medicine

## 2023-01-17 ENCOUNTER — Other Ambulatory Visit: Payer: Self-pay

## 2023-01-17 DIAGNOSIS — R002 Palpitations: Secondary | ICD-10-CM | POA: Insufficient documentation

## 2023-01-17 DIAGNOSIS — Z86011 Personal history of benign neoplasm of the brain: Secondary | ICD-10-CM | POA: Insufficient documentation

## 2023-01-17 DIAGNOSIS — I10 Essential (primary) hypertension: Secondary | ICD-10-CM | POA: Diagnosis not present

## 2023-01-17 LAB — BASIC METABOLIC PANEL
Anion gap: 9 (ref 5–15)
BUN: 25 mg/dL — ABNORMAL HIGH (ref 8–23)
CO2: 23 mmol/L (ref 22–32)
Calcium: 9.2 mg/dL (ref 8.9–10.3)
Chloride: 106 mmol/L (ref 98–111)
Creatinine, Ser: 0.91 mg/dL (ref 0.61–1.24)
GFR, Estimated: >60 mL/min (ref >60)
Glucose, Bld: 171 mg/dL — ABNORMAL HIGH (ref 70–99)
Potassium: 4.1 mmol/L (ref 3.5–5.1)
Sodium: 138 mmol/L (ref 135–145)

## 2023-01-17 LAB — CBC
HCT: 44.6 % (ref 39.0–52.0)
Hemoglobin: 14.9 g/dL (ref 13.0–17.0)
MCH: 31.3 pg (ref 26.0–34.0)
MCHC: 33.4 g/dL (ref 30.0–36.0)
MCV: 93.7 fL (ref 80.0–100.0)
Platelets: 254 10*3/uL (ref 150–400)
RBC: 4.76 MIL/uL (ref 4.22–5.81)
RDW: 12.6 % (ref 11.5–15.5)
WBC: 11.5 10*3/uL — ABNORMAL HIGH (ref 4.0–10.5)
nRBC: 0 % (ref 0.0–0.2)

## 2023-01-17 LAB — TROPONIN I (HIGH SENSITIVITY): Troponin I (High Sensitivity): 5 ng/L (ref <18)

## 2023-01-17 NOTE — ED Triage Notes (Signed)
Pt reports palpitations that started today and reports hx of same in the past. He took anxiety meds without relief of palpitations. Pt reports he was recently started on a steroid and is almost at the end of his supply. Pt denies other complaints.

## 2023-01-18 NOTE — ED Provider Notes (Signed)
Emergency Department Provider Note   I have reviewed the triage vital signs and the nursing notes.   HISTORY  Chief Complaint Palpitations   HPI Joshua [name] is a 62 y.o. male with past history reviewed below presents emergency department with intermittent heart palpitations.  He has been on steroid medicines for his low back pain for the past week and has had increase in his heart palpitations today.  Denies any chest pain/pressure/tightness.  No shortness of breath or near syncope.  He has had palpitations in the past and follows with cardiology.  He has propranolol to take as needed and took 2 doses 4 hours apart with some improvement in symptoms.  Given that this increased in intensity today he presents for evaluation.  No other change to medicines.  He does have 2 tablets of steroid left to take tomorrow and that his last dose. Back pain improving.    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 Positive palpitations.  Respiratory: Denies shortness of breath. Gastrointestinal: No abdominal pain.   Musculoskeletal: Negative for back pain. Skin: Negative for rash. Neurological: Negative for headaches, focal weakness or numbness.  ____________________________________________   PHYSICAL EXAM:  VITAL SIGNS: ED Triage Vitals  Enc Vitals Group     BP  01/17/23 2245 (!) 153/94     Pulse Rate 01/17/23 2245 70     Resp 01/17/23 2245 18     Temp 01/17/23 2245 98.1 F (36.7 C)     Temp Source 01/17/23 2245 Oral     SpO2 01/17/23 2245 96 %     Weight 01/17/23 2247 179 lb (81.2 kg)     Height 01/17/23 2247 5\' 9"  (1.753 m)   Constitutional: Alert and oriented. Well appearing and in no acute distress. Eyes: Conjunctivae are normal.  Head: Atraumatic. Nose: No congestion/rhinnorhea. Mouth/Throat: Mucous membranes are moist.   Neck: No stridor.   Cardiovascular: Normal rate, regular rhythm. Good peripheral circulation. Grossly normal heart sounds.   Respiratory: Normal respiratory effort.  No retractions. Lungs CTAB. Gastrointestinal: Soft and nontender. No distention.  Musculoskeletal: No lower extremity tenderness nor edema. No gross deformities of extremities. Neurologic:  Normal speech and language. No gross focal neurologic deficits are appreciated.  Skin:  Skin is warm, dry and intact. No rash noted.  ____________________________________________   LABS (all labs ordered are listed, but only abnormal results are displayed)  Labs Reviewed  BASIC METABOLIC PANEL - Abnormal; Notable for the following components:      Result Value   Glucose, Bld 171 (*)    BUN 25 (*)    All other components within normal limits  CBC - Abnormal; Notable for the following components:   WBC 11.5 (*)    All other components within normal limits  TROPONIN I (HIGH SENSITIVITY)  TROPONIN I (HIGH SENSITIVITY)   ____________________________________________  EKG   EKG Interpretation Date/Time:  Tuesday January 17 2023 22:50:34 EDT Ventricular Rate:  55 PR Interval:  136 QRS Duration:  124 QT Interval:  420 QTC Calculation: 402 R Axis:   99  Text Interpretation: Sinus rhythm Right bundle branch block Confirmed by Alona Bene [phone]) on 01/17/2023 11:53:12 PM       ____________________________________________   PROCEDURES  Procedure(s)  performed:   Procedures  None  ____________________________________________   INITIAL IMPRESSION / ASSESSMENT AND PLAN / ED COURSE  Pertinent labs & imaging results that were available during my care of the patient were reviewed by me and considered in my medical decision making (see chart for details).   This patient is Presenting for Evaluation of palpitations, which does require a range of treatment options, and is a complaint that involves a high risk of morbidity and mortality.  The Differential Diagnoses include arrhythmia, medication side effect, ACS, PE, electrolyte disturbance, etc.   Clinical Laboratory Tests Ordered, included BMP without electrolyte abnormality. No anemia. Normal troponin.   Cardiac Monitor Tracing which shows NSR. No ectopy.    Social Determinants of Health Risk denies any drug use.   Medical Decision Making: Summary:  Patient presents to the emergency department with heart palpitations worsening today.  This may be secondary to steroid medicines he is taking for his low back pain.  No other concerning features in his history.  Labs are reassuring.  He has a known history of intermittent palpitations similar to this in the past.  He will complete his steroid course, last dose tomorrow, and continue to follow with his PCP and cardiology teams.  He has propranolol to take as needed, which he can continue.  Patient's presentation is most consistent with acute presentation with potential threat to life or bodily function.   Disposition: discharge  ____________________________________________  FINAL CLINICAL IMPRESSION(S) / ED DIAGNOSES  Final diagnoses:  Palpitations    Note:  This document was prepared using Dragon voice recognition software and may include unintentional dictation errors.  Alona Bene, MD, [facility]    Teyanna Thielman, Arlyss Repress, MD 01/18/23 [phone]

## 2023-01-18 NOTE — Discharge Instructions (Signed)
Please continue to follow with your primary care doctor and cardiology teams.  Return to the emergency department with any new or suddenly worsening symptoms.

## 2023-01-25 ENCOUNTER — Telehealth: Payer: Self-pay

## 2023-01-25 ENCOUNTER — Other Ambulatory Visit: Payer: 59 | Admitting: Urology

## 2023-01-25 NOTE — Telephone Encounter (Signed)
alled left on triage line 1253pm   Pt has fusion scheduled for today at 145.   Pt states he did not stop his ASA as directed. He was seen in the ED on 7/2 for palpitations. Was taking steriods at the time for low back pain.  Takes propanolol.   Pt states he was aware he needed to stop the ASA but chose not to.   Fusion bx r/s to 10/24. Instructions for bx sent via my chart.   ~~Pt did call this a.m. (spoke to [facility]) to inquiry about prep and did not mention that he had not stopped ASA~~

## 2023-01-30 DIAGNOSIS — M5416 Radiculopathy, lumbar region: Secondary | ICD-10-CM | POA: Diagnosis not present

## 2023-01-30 DIAGNOSIS — Z1211 Encounter for screening for malignant neoplasm of colon: Secondary | ICD-10-CM | POA: Diagnosis not present

## 2023-02-01 ENCOUNTER — Ambulatory Visit
Admission: RE | Admit: 2023-02-01 | Discharge: 2023-02-01 | Disposition: A | Discharge status: Home / Self Care | Payer: 59 | Source: Ambulatory Visit | Attending: Surgical | Admitting: Surgical

## 2023-02-01 DIAGNOSIS — M5442 Lumbago with sciatica, left side: Secondary | ICD-10-CM

## 2023-02-01 DIAGNOSIS — M2578 Osteophyte, vertebrae: Secondary | ICD-10-CM | POA: Diagnosis not present

## 2023-02-01 DIAGNOSIS — M545 Low back pain, unspecified: Secondary | ICD-10-CM | POA: Diagnosis not present

## 2023-02-01 NOTE — Progress Notes (Signed)
Joshua [name], can you set [name] up for L-spine ESI with Dr. Alvester Morin?  Still having radicular symptoms down the left leg but also has focal buttocks pain which may be due to the sacral fracture.  Still has a persistent symptoms, can we check vitamin D and new radiographs specifically of the sacrum with nurse only visit?

## 2023-02-03 ENCOUNTER — Telehealth: Payer: Self-pay

## 2023-02-03 ENCOUNTER — Other Ambulatory Visit: Payer: Self-pay

## 2023-02-03 DIAGNOSIS — M5442 Lumbago with sciatica, left side: Secondary | ICD-10-CM

## 2023-02-03 NOTE — Telephone Encounter (Signed)
Can you please call and schedule nurse visit? I placed order for [facility]

## 2023-02-03 NOTE — Telephone Encounter (Signed)
Lvm for pt to cb 

## 2023-02-03 NOTE — Telephone Encounter (Signed)
-----   Message from [facility] sent at 02/01/2023  5:03 PM EDT ----- Johnsie Kindred, can you set [name] up for L-spine ESI with Dr. Alvester Morin?  Still having radicular symptoms down the left leg but also has focal buttocks pain which may be due to the sacral fracture.  Still has a persistent symptoms, can we check vitamin D a nd new radiographs specifically of the sacrum with nurse only visit?

## 2023-02-03 NOTE — Telephone Encounter (Signed)
Called and scheduled

## 2023-02-03 NOTE — Telephone Encounter (Signed)
-----   Message from [facility] sent at 02/01/2023  5:03 PM EDT ----- Joshua [name], can you set Joshua [name] up for L-spine ESI with Dr. Alvester Morin?  Still having radicular symptoms down the left leg but also has focal buttocks pain which may be due to the sacral fracture.  Still has a persistent symptoms, can we check vitamin D a nd new radiographs specifically of the sacrum with nurse only visit?

## 2023-02-07 ENCOUNTER — Ambulatory Visit (INDEPENDENT_AMBULATORY_CARE_PROVIDER_SITE_OTHER): Payer: 59

## 2023-02-07 ENCOUNTER — Other Ambulatory Visit: Payer: Self-pay

## 2023-02-07 DIAGNOSIS — M533 Sacrococcygeal disorders, not elsewhere classified: Secondary | ICD-10-CM

## 2023-02-07 DIAGNOSIS — M5442 Lumbago with sciatica, left side: Secondary | ICD-10-CM | POA: Diagnosis not present

## 2023-02-07 NOTE — Progress Notes (Signed)
Patient is here for x-ray and lab work only.

## 2023-02-08 LAB — VITAMIN D 25 HYDROXY (VIT D DEFICIENCY, FRACTURES): Vit D, 25-Hydroxy: 36 ng/mL (ref 30–100)

## 2023-02-09 ENCOUNTER — Telehealth: Payer: Self-pay | Admitting: Podiatry

## 2023-02-09 NOTE — Telephone Encounter (Signed)
PT CALLED [facility] AND ADVISED THEM THAT HE WANTED TO CANCEL SURGERY ON 02/14/23. CALLED PATIENT TO CONFIRM CANCELLATION. PT STATED THAT HE WANTED TO CANCEL DUE TO AN UPCOMING VACATION. ADVISED THAT HE CAN CALL TO RESCHEDULE ANY TIME. NOTIFIED [facility] AND DR Ralene Cork

## 2023-02-09 NOTE — Telephone Encounter (Signed)
Thank you :)

## 2023-02-13 ENCOUNTER — Ambulatory Visit: Payer: 59 | Admitting: Surgical

## 2023-02-14 ENCOUNTER — Telehealth: Payer: Self-pay

## 2023-02-14 NOTE — Telephone Encounter (Signed)
Patient return the called to schedule his colonoscopy.

## 2023-02-15 ENCOUNTER — Other Ambulatory Visit: Payer: Self-pay | Admitting: Internal Medicine

## 2023-02-16 ENCOUNTER — Other Ambulatory Visit: Payer: Self-pay | Admitting: *Deleted

## 2023-02-16 NOTE — Telephone Encounter (Signed)
Per last OV, continue medication. Lorayne Marek, RN

## 2023-02-17 ENCOUNTER — Telehealth: Payer: Self-pay

## 2023-02-17 ENCOUNTER — Other Ambulatory Visit: Payer: Self-pay

## 2023-02-17 DIAGNOSIS — Z86010 Personal history of colonic polyps: Secondary | ICD-10-CM

## 2023-02-17 NOTE — Telephone Encounter (Signed)
Pts last OV was 09/28/22 with Jari Favre, PA.      Pre-operative Risk Assessment    Patient Name: Joshua [name]  DOB: May 10, 1961 MRN: [id number]      Request for Surgical Clearance    Procedure:   Colonoscopy  Date of Surgery:  Clearance 04/10/23                                 Surgeon:  Lannette Donath, MD Surgeon's Group or Practice Name:  [facility] Phone number:  [phone] Fax number:  [phone] ATTN: Iva Lento, CMA   Type of Clearance Requested:   - Medical  - Pharmacy:  Hold Aspirin pt will need instructions on when/if to hold   Type of Anesthesia:   Propofol   Additional requests/questions:    Wynetta Fines   02/17/2023, 3:42 PM

## 2023-02-17 NOTE — Telephone Encounter (Signed)
Gastroenterology Pre-Procedure Review  Request Date: 04/10/23 Requesting Physician: Dr. Allegra Lai  PATIENT REVIEW QUESTIONS: The patient responded to the following health history questions as indicated:    1. Are you having any GI issues? no 2. Do you have a personal history of Polyps? yes (patient had flex sigmoid 06/29/2020 polyps were noted.  Performed by [facility]) 3. Do you have a family history of Colon Cancer or Polyps? 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 Diabetes Mellitus? yes (has been advised to stop 2 days prior to colonoscopy) 5. Joint replacements in the past 12 months?no 6. Major health problems in the past 3 months?no 7. Any artificial heart valves, MVP, or defibrillator? 8. Cardiac issues? Yes htn, aortic atherosclerosis cardiac clearance sent to CV Preop Team    MEDICATIONS & ALLERGIES:    Patient reports the following regarding taking any anticoagulation/antiplatelet therapy:   Plavix, Coumadin, Eliquis, Xarelto, Lovenox, Pradaxa, Brilinta, or Effient? no Aspirin? yes (81mg )  Patient confirms/reports the following medications:  Current Outpatient Medications  Medication Sig Dispense Refill   Accu-Chek Softclix Lancets lancets Use as instructed 100 each 12   acetaminophen (TYLENOL) 500 MG tablet Take 1,000 mg by mouth every 6 (six) hours as needed for moderate pain or headache.     albuterol (VENTOLIN HFA) 108 (90 Base) MCG/ACT inhaler Inhale 2 puffs into the lungs every 6 (six) hours as needed for wheezing or shortness of breath. 8 g 2   amLODipine (NORVASC) 5 MG tablet TAKE 1 TABLET (5 MG TOTAL) BY MOUTH DAILY. TO LOWER BLOOD PRESSURE. STOP LOSARTAN 90 tablet 0   aspirin 81 MG chewable tablet Chew 81 mg by mouth daily.     atorvastatin (LIPITOR) 20 MG tablet TAKE 1 TABLET (20 MG TOTAL) BY MOUTH DAILY. 30 tablet 0   Blood Glucose Monitoring Suppl (ACCU-CHEK GUIDE ME) w/Device KIT Use to check blood sugar once daily. 1 kit 0   clonazePAM (KLONOPIN) 0.5 MG tablet TAKE 1  TABLET (0.5 MG TOTAL) BY MOUTH AT BEDTIME. 60 tablet 1   diazepam (VALIUM) 2 MG tablet Take 1 tablet (2 mg total) by mouth 2 (two) times daily. 8 tablet 0   glucose blood (ACCU-CHEK GUIDE) test strip Use to check blood sugar once daily. 100 each 6   ketoconazole (NIZORAL) 2 % cream APPLY 1 APPLICATION. TOPICALLY DAILY. 60 g 2   Lacosamide 100 MG TABS TAKE 1 TABLET (100 MG TOTAL) BY MOUTH IN THE MORNING AND AT BEDTIME. 60 tablet 2   lamoTRIgine (LAMICTAL) 200 MG tablet TAKE 1 TABLET (200 MG TOTAL) BY MOUTH 2 (TWO) TIMES DAILY. 60 tablet 3   lidocaine (LIDODERM) 5 % Place 1 patch onto the skin daily. Remove & Discard patch within 12 hours or as directed by MD 14 patch 0   meclizine (ANTIVERT) 25 MG tablet Take 1 tablet (25 mg total) by mouth 3 (three) times daily as needed for dizziness. 30 tablet 0   metFORMIN (GLUCOPHAGE) 500 MG tablet Take 1 tablet (500 mg total) by mouth 2 (two) times daily. 180 tablet 1   methocarbamol (ROBAXIN) 750 MG tablet Take 750 mg by mouth daily as needed for muscle spasms.     omeprazole (PRILOSEC) 20 MG capsule Take 20 mg by mouth daily.     predniSONE (STERAPRED UNI-PAK 21 TAB) 10 MG (21) TBPK tablet Take as directed on package 21 tablet 0   propranolol (INDERAL) 10 MG tablet Take 1 tablet (10 mg total) by mouth 3 (three) times daily as needed (palpitations). 60  tablet 11   tadalafil (CIALIS) 5 MG tablet Take 1 tablet (5 mg total) by mouth daily as needed for erectile dysfunction. 1 hour prior to sexual activity 30 tablet 11   tamsulosin (FLOMAX) 0.4 MG CAPS capsule Take 1 capsule (0.4 mg total) by mouth daily. 90 capsule 3   VITAMIN A PO Take 1 capsule by mouth daily.     No current facility-administered medications for this visit.    Patient confirms/reports the following allergies:  No Known Allergies  No orders of the defined types were placed in this encounter.   AUTHORIZATION INFORMATION Primary Insurance: 1D#: Group #:  Secondary  Insurance: 1D#: Group #:  SCHEDULE INFORMATION: Date: 04/10/23 Time: Location: [facility]

## 2023-02-20 ENCOUNTER — Ambulatory Visit (INDEPENDENT_AMBULATORY_CARE_PROVIDER_SITE_OTHER): Payer: 59 | Admitting: Surgical

## 2023-02-20 DIAGNOSIS — M545 Low back pain, unspecified: Secondary | ICD-10-CM

## 2023-02-20 DIAGNOSIS — G8929 Other chronic pain: Secondary | ICD-10-CM

## 2023-02-20 MED ORDER — LAMOTRIGINE 200 MG PO TABS: 200.0000 mg | ORAL_TABLET | Freq: Two times a day (BID) | ORAL | 3 refills | Status: DC

## 2023-02-20 NOTE — Telephone Encounter (Signed)
   Name: Joshua [name]  DOB: 28-Oct-1960  MRN: [id number]  Primary Cardiologist: Armanda Magic, MD   Preoperative team, please contact this patient and set up a phone call appointment for further preoperative risk assessment. Please obtain consent and complete medication review. Thank you for your help.  I confirm that guidance regarding antiplatelet and oral anticoagulation therapy has been completed and, if necessary, noted below.  Ideally aspirin should be continued without interruption, however if the bleeding risk is too great, aspirin may be held for 5-7 days prior to surgery. Please resume aspirin post operatively when it is felt to be safe from a bleeding standpoint.     Carlos Levering, NP 02/20/2023, 4:23 PM [facility]

## 2023-02-21 ENCOUNTER — Encounter: Payer: 59 | Admitting: Podiatry

## 2023-02-21 ENCOUNTER — Encounter: Payer: Self-pay | Admitting: Surgical

## 2023-02-21 NOTE — Progress Notes (Signed)
Office Visit Note   Patient: Joshua [name]           Date of Birth: 1961/02/13           MRN: [id number] Visit Date: 02/20/2023 Requested by: Alease Medina, MD [address] [place],  [place] [address] PCP: Alease Medina, MD  Subjective: Chief Complaint  Patient presents with   Other     Review MRI    HPI: Joshua [name] is a 62 y.o. male who presents to the office for MRI review.  The patient states that the buttock/sacral pain he was previously experiencing has significantly gone down.  Now he complains of primarily axial lumbar spine pain around the level of L1 and L2.  The radicular pain he was having down his leg has calm down significantly as well.  Denies any red flag symptoms such as bowel/bladder incontinence or saddle anesthesia.  Has been taking vitamin D to help with what appeared to be sacral fracture noted on the MRI scan.  Had follow-up radiographs demonstrated no displacement at the site of the sacral fracture.    MRI results revealed: MR Lumbar Spine w/o contrast  Result Date: 02/01/2023 CLINICAL DATA:  Low back pain and bilateral hip pain with extension down the left leg. Six months duration. Symptoms worsened after a fall 2 months ago. EXAM: MRI LUMBAR SPINE WITHOUT CONTRAST TECHNIQUE: Multiplanar, multisequence MR imaging of the lumbar spine was performed. No intravenous contrast was administered. COMPARISON:  Radiography 01/11/2023 FINDINGS: Segmentation:  5 lumbar type vertebral bodies. Alignment: Straightening of the normal lumbar lordosis. Scoliotic curvature convex to the right with the apex at L3. Vertebrae: No fracture in the lumbar region. Abnormal edema visible at the edge of the study at the S3 level consistent with a recent sacral fracture. This was not evaluated completely. Benign appearing hemangioma within the left side of the L4 vertebral body. Conus medullaris and cauda equina: Conus extends to the L1 level. Conus and cauda equina appear normal.  Paraspinal and other soft tissues: Negative Disc levels: T11-12, T12-L1 and L1-2: No disc pathology. Right-sided osteophytes at the L1-2 level do encroach somewhat of upon the extraforaminal region but the significance is doubtful. L2-3: Prominent right lateral bridging osteophytes. Right foraminal to extraforaminal encroachment could possibly affect the right L2 nerve, but finding is obviously chronic. L3-4: Similar prominent right-sided osteophytes with encroachment upon the right lateral foraminal to extraforaminal region. L4-5: Bulging of the discs with lateral osteophytes prominent on the left. Mild facet and ligamentous hypertrophy. Mild narrowing of the lateral recesses but no compression of the nerves within the canal. The left-sided osteophytes could possibly affect the L4 nerve in the extra-spinal region. L5-S1: Endplate osteophytes and bulging of the disc. Bilateral facet osteoarthritis. No canal or lateral recess stenosis. Bilateral foraminal encroachment could affect either L5 nerve. The facet arthritis could be painful. IMPRESSION: 1. Abnormal edema at the edge of the study at the S3 level consistent with a recent sacral fracture. This was not evaluated completely. 2. Scoliotic curvature convex to the right with the apex at L3. 3. Right-sided bridging osteophytes at L2-3 and L3-4 could possibly affect the right-sided exiting nerves. Finding is obviously chronic. 4. Left-sided osteophytes at L4-5 could possibly affect the left L4 nerve in the extra-spinal region. This also appears chronic. 5. Bilateral foraminal stenosis due to osteophytes, bulging of the disc and facet arthropathy at L5-S1 that could affect either L5 nerve. The facet arthritis could be painful. Electronically Signed  By: Paulina Fusi M.D.   On: 02/01/2023 07:42                 ROS: All systems reviewed are negative as they relate to the chief complaint within the history of present illness.  Patient denies fevers or  chills.  Assessment & Plan: Visit Diagnoses:  1. Chronic midline low back pain without sciatica     Plan: Joshua [name] is a 62 y.o. male who presents to the office for review of MRI lumbar spine.  MRI findings were primarily discussed on the phone several weeks ago at that point he was still having radicular pain down his leg and was set up for lumbar spine ESI with Dr. Alvester Morin.  He also had radiographs of the sacrum at that time due to sacral fracture demonstrated no displacement.  This sacral pain has significantly improved and he has no tenderness on exam today.  He has no weakness noted on exam today.  Primary complaint of axial lumbar spine.  He would like to keep his appoint with Dr. Alvester Morin for next week and see if there are any injections that he can offer that may improve his pain.  Otherwise, follow-up with the office as needed.  Discussed other modalities for pain relief such as activity modification and physical therapy but he would like to forego these for now.  Follow-Up Instructions: No follow-ups on file.   Orders:  No orders of the defined types were placed in this encounter.  No orders of the defined types were placed in this encounter.     Procedures: No procedures performed   Clinical Data: No additional findings.  Objective: Vital Signs: There were no vitals taken for this visit.  Physical Exam:  Constitutional: Patient appears well-developed HEENT:  Head: Normocephalic Eyes:EOM are normal Neck: Normal range of motion Cardiovascular: Normal rate Pulmonary/chest: Effort normal Neurologic: Patient is alert Skin: Skin is warm Psychiatric: Patient has normal mood and affect  Ortho Exam: Ortho exam demonstrates tenderness throughout the lumbar spine primarily around the level of L1-L2 in the axial L-spine.  He has increased pain with flexion and extension of the spine in this area.  He has no tenderness through the sacrum.  Good strength of hip flexion,  quadricep, hamstring, dorsiflexion, plantar flexion, EHL.  No clonus noted on exam today bilaterally.  No pain with passive motion of the hip bilaterally.  Negative FADIR sign.  Negative straight leg raise bilaterally.  Specialty Comments:  Narrative & Impression CLINICAL DATA:  Low back pain and bilateral hip pain with extension down the left leg. Six months duration. Symptoms worsened after a fall 2 months ago.   EXAM: MRI LUMBAR SPINE WITHOUT CONTRAST   TECHNIQUE: Multiplanar, multisequence MR imaging of the lumbar spine was performed. No intravenous contrast was administered.   COMPARISON:  Radiography 01/11/2023   FINDINGS: Segmentation:  5 lumbar type vertebral bodies.   Alignment: Straightening of the normal lumbar lordosis. Scoliotic curvature convex to the right with the apex at L3.   Vertebrae: No fracture in the lumbar region. Abnormal edema visible at the edge of the study at the S3 level consistent with a recent sacral fracture. This was not evaluated completely. Benign appearing hemangioma within the left side of the L4 vertebral body.   Conus medullaris and cauda equina: Conus extends to the L1 level. Conus and cauda equina appear normal.   Paraspinal and other soft tissues: Negative   Disc levels:   T11-12, T12-L1 and  L1-2: No disc pathology. Right-sided osteophytes at the L1-2 level do encroach somewhat of upon the extraforaminal region but the significance is doubtful.   L2-3: Prominent right lateral bridging osteophytes. Right foraminal to extraforaminal encroachment could possibly affect the right L2 nerve, but finding is obviously chronic.   L3-4: Similar prominent right-sided osteophytes with encroachment upon the right lateral foraminal to extraforaminal region.   L4-5: Bulging of the discs with lateral osteophytes prominent on the left. Mild facet and ligamentous hypertrophy. Mild narrowing of the lateral recesses but no compression of the  nerves within the canal. The left-sided osteophytes could possibly affect the L4 nerve in the extra-spinal region.   L5-S1: Endplate osteophytes and bulging of the disc. Bilateral facet osteoarthritis. No canal or lateral recess stenosis. Bilateral foraminal encroachment could affect either L5 nerve. The facet arthritis could be painful.   IMPRESSION: 1. Abnormal edema at the edge of the study at the S3 level consistent with a recent sacral fracture. This was not evaluated completely. 2. Scoliotic curvature convex to the right with the apex at L3. 3. Right-sided bridging osteophytes at L2-3 and L3-4 could possibly affect the right-sided exiting nerves. Finding is obviously chronic. 4. Left-sided osteophytes at L4-5 could possibly affect the left L4 nerve in the extra-spinal region. This also appears chronic. 5. Bilateral foraminal stenosis due to osteophytes, bulging of the disc and facet arthropathy at L5-S1 that could affect either L5 nerve. The facet arthritis could be painful.  Imaging: No results found.   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   Seizures (HCC) 01/31/2019   Meningioma, recurrent of brain (HCC) 01/31/2019   Meningioma (HCC) 01/03/2019   Diffuse idiopathic skeletal hyperostosis 06/05/2013   Past Medical History:  Diagnosis Date   Anxiety    Brain tumor (HCC) 02/20/2013   brain tumor removed in March 2014, Dr Rush Farmer   Diffuse idiopathic  skeletal hyperostosis 06/05/2013   Dizziness    Enlarged prostate    GERD (gastroesophageal reflux disease)    Headache(784.0)    scattered   High cholesterol    Hypertension    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   Meningioma (HCC)    Meningioma, recurrent of brain (HCC) 01/31/2019   Neuropathy 12/05/2019   Pre-diabetes    Rotator cuff tear    right   Seizures (HCC)    11/27/19 last sz 1 wk ago    Family History  Problem Relation Age of Onset   Hypertension Mother    Dementia Mother    Diabetes Mother    Hypertension Father    CAD Father    Diabetes Father    CAD Brother     Past Surgical History:  Procedure Laterality Date   APPLICATION OF CRANIAL NAVIGATION N/A 01/10/2019   Procedure: APPLICATION OF CRANIAL NAVIGATION;  Surgeon: Maeola Harman, MD;  Location: [facility] OR;  Service: Neurosurgery;  Laterality: N/A;   CATARACT EXTRACTION Right    CRANIOTOMY Right 11/06/2012  Procedure: CRANIOTOMY TUMOR EXCISION;  Surgeon: Maeola Harman, MD;  Location: [facility];  Service: Neurosurgery;  Laterality: Right;  Right Parasagittal craniotomy for meningioma with Stealth   CRANIOTOMY Right 01/10/2019   Procedure: Right Parasagittal Craniotomy for Tumor;  Surgeon: Maeola Harman, MD;  Location: [facility] OR;  Service: Neurosurgery;  Laterality: Right;  Right parasagittal craniotomy for tumor   ESOPHAGOGASTRODUODENOSCOPY     JOINT REPLACEMENT Right 2018   right hip   REVERSE SHOULDER ARTHROPLASTY Right 01/08/2021   Procedure: RIGHT REVERSE SHOULDER ARTHROPLASTY;  Surgeon: Cammy Copa, MD;  Location: [facility] OR;  Service: Orthopedics;  Laterality: Right;   right knee arthroscopy     SHOULDER ARTHROSCOPY Right 06/09/2020   Procedure: RIGHT SHOULDER ARTHROSCOPY AND DEBRIDEMENT;  Surgeon: Nadara Mustard, MD;  Location: [facility];  Service: Orthopedics;  Laterality: Right;   Social History   Occupational History   Not on file  Tobacco Use   Smoking  status: Former    Current packs/day: 0.00    Average packs/day: (0.2 ttl pk-yrs)    Types: Cigarettes    Start date: 01/29/2004    Quit date: 01/29/2019    Years since quitting: 4.0    Passive exposure: Past   Smokeless tobacco: Never   Tobacco comments:    weekend smoker  Vaping Use   Vaping status: Some Days   Substances: Nicotine, Flavoring  Substance and Sexual Activity   Alcohol use: Yes    Comment: social   Drug use: No   Sexual activity: Not Currently

## 2023-02-21 NOTE — Telephone Encounter (Signed)
1st attempt at scheduled preop tele appt. [facility]

## 2023-02-22 ENCOUNTER — Telehealth: Payer: Self-pay

## 2023-02-22 NOTE — Telephone Encounter (Signed)
Pt is scheduled for tele on 09/09 at 9:20am. Med rec and consent done

## 2023-02-22 NOTE — Telephone Encounter (Signed)
Pt is scheduled for tele on 09/09 at 9:20am. Med rec and consent done    Patient Consent for Virtual Visit        Joshua [name] has provided verbal consent on 02/22/2023 for a virtual visit (video or telephone).   CONSENT FOR VIRTUAL VISIT FOR:  Joshua [name]  By participating in this virtual visit I agree to the following:  I hereby voluntarily request, consent and authorize [facility] and its employed or contracted physicians, physician assistants, nurse practitioners or other licensed health care professionals (the Practitioner), to provide me with telemedicine health care services (the "Services") as deemed necessary by the treating Practitioner. I acknowledge and consent to receive the Services by the Practitioner via telemedicine. I understand that the telemedicine visit will involve communicating with the Practitioner through live audiovisual communication technology and the disclosure of certain medical information by electronic transmission. I acknowledge that I have been given the opportunity to request an in-person assessment or other available alternative prior to the telemedicine visit and am voluntarily participating in the telemedicine visit.  I understand that I have the right to withhold or withdraw my consent to the use of telemedicine in the course of my care at any time, without affecting my right to future care or treatment, and that the Practitioner or I may terminate the telemedicine visit at any time. I understand that I have the right to inspect all information obtained and/or recorded in the course of the telemedicine visit and may receive copies of available information for a reasonable fee.  I understand that some of the potential risks of receiving the Services via telemedicine include:  Delay or interruption in medical evaluation due to technological equipment failure or disruption; Information transmitted may not be sufficient (e.g. poor resolution of  images) to allow for appropriate medical decision making by the Practitioner; and/or  In rare instances, security protocols could fail, causing a breach of personal health information.  Furthermore, I acknowledge that it is my responsibility to provide information about my medical history, conditions and care that is complete and accurate to the best of my ability. I acknowledge that Practitioner's advice, recommendations, and/or decision may be based on factors not within their control, such as incomplete or inaccurate data provided by me or distortions of diagnostic images or specimens that may result from electronic transmissions. I understand that the practice of medicine is not an exact science and that Practitioner makes no warranties or guarantees regarding treatment outcomes. I acknowledge that a copy of this consent can be made available to me via my patient portal [facility] MyChart), or I can request a printed copy by calling the office of [facility].    I understand that my insurance will be billed for this visit.   I have read or had this consent read to me. I understand the contents of this consent, which adequately explains the benefits and risks of the Services being provided via telemedicine.  I have been provided ample opportunity to ask questions regarding this consent and the Services and have had my questions answered to my satisfaction. I give my informed consent for the services to be provided through the use of telemedicine in my medical care

## 2023-02-27 ENCOUNTER — Encounter: Payer: 59 | Admitting: Physical Medicine and Rehabilitation

## 2023-03-07 ENCOUNTER — Encounter: Payer: 59 | Admitting: Podiatry

## 2023-03-17 ENCOUNTER — Ambulatory Visit (HOSPITAL_COMMUNITY)
Admission: RE | Admit: 2023-03-17 | Discharge: 2023-03-17 | Disposition: A | Discharge status: Home / Self Care | Payer: 59 | Source: Ambulatory Visit | Attending: Internal Medicine | Admitting: Internal Medicine

## 2023-03-17 DIAGNOSIS — G9389 Other specified disorders of brain: Secondary | ICD-10-CM | POA: Diagnosis not present

## 2023-03-17 DIAGNOSIS — C700 Malignant neoplasm of cerebral meninges: Secondary | ICD-10-CM | POA: Insufficient documentation

## 2023-03-17 DIAGNOSIS — C709 Malignant neoplasm of meninges, unspecified: Secondary | ICD-10-CM | POA: Diagnosis not present

## 2023-03-17 DIAGNOSIS — I6782 Cerebral ischemia: Secondary | ICD-10-CM | POA: Diagnosis not present

## 2023-03-17 DIAGNOSIS — R9089 Other abnormal findings on diagnostic imaging of central nervous system: Secondary | ICD-10-CM | POA: Diagnosis not present

## 2023-03-17 MED ORDER — GADOBUTROL 1 MMOL/ML IV SOLN
8.0000 mL | Freq: Once | INTRAVENOUS | Status: AC | PRN
  Administered 2023-03-17: 8 mL via INTRAVENOUS

## 2023-03-19 HISTORY — PX: PROSTATE BIOPSY: SHX241

## 2023-03-23 ENCOUNTER — Ambulatory Visit: Payer: 59 | Admitting: Internal Medicine

## 2023-03-24 NOTE — Progress Notes (Unsigned)
Virtual Visit via Telephone Note   Because of Ric Crafts Myron's co-morbid illnesses, he is at least at moderate risk for complications without adequate follow up.  This format is felt to be most appropriate for this patient at this time.  The patient did not have access to video technology/had technical difficulties with video requiring transitioning to audio format only (telephone).  All issues noted in this document were discussed and addressed.  No physical exam could be performed with this format.  Please refer to the patient's chart for his consent to telehealth for [facility].  Evaluation Performed:  Preoperative cardiovascular risk assessment _____________   Date:  03/27/2023   Patient ID:  Joshua [name], DOB 1961-04-01, MRN [id number] Patient Location:  Home Provider location:   Office  Primary Care Provider:  Alease Medina, MD Primary Cardiologist:  Armanda Magic, MD  Chief Complaint / Patient Profile   62 y.o. y/o male with a h/o hypertension, hyperlipidemia, chronic right bundle branch block, palpitations, family history of premature CAD, aortic atherosclerosis, chronic chest pain.  Most recent cardiac monitor August 2023 revealed predominant rhythm was normal sinus rhythm with a rare PAC, coronary artery CT revealed calcium score of 0, nuclear medicine stress test 2021 was normal.   He is pending colonoscopy with Dr. Allegra Lai on 04/10/2023 and presents today for telephonic preoperative cardiovascular risk assessment.  History of Present Illness    Joshua [name] is a 62 y.o. male who presents via audio/video conferencing for a telehealth visit today.  Pt was last seen in cardiology clinic on 09/28/2022 by by Jari Favre, PA.  At that time Joshua [name] was doing well chest discomfort was found to be noncardiac in etiology..  The patient is now pending procedure as outlined above. Since his last visit, he has been doing well from a cardiac standpoint.  He  requests a follow-up appointment for ongoing lab work.  He is not very active due to musculoskeletal abnormalities and uses a cane for ambulation.  He is able to do low-level activities but nothing overly exertional that would require him to run or lift heavy objects.  He is also pending a prostate biopsy but this clearance is more directed at colonoscopy as we have not received a request for prostate biopsy clearance.  He has been holding his aspirin as the biopsy is scheduled for 03/29/2023.  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   Meningioma (HCC)    Meningioma, recurrent of brain (HCC) 01/31/2019   Neuropathy 12/05/2019   Pre-diabetes    Rotator cuff tear    right   Seizures (HCC)    11/27/19 last sz 1 wk ago   Past Surgical History:  Procedure Laterality Date   APPLICATION OF CRANIAL NAVIGATION N/A 01/10/2019   Procedure: APPLICATION OF CRANIAL NAVIGATION;  Surgeon: Maeola Harman, MD;  Location: [facility] OR;  Service: Neurosurgery;  Laterality: N/A;   CATARACT EXTRACTION Right    CRANIOTOMY Right 11/06/2012   Procedure: CRANIOTOMY TUMOR EXCISION;  Surgeon: Maeola Harman, MD;  Location: [facility] NEURO  ORS;  Service: Neurosurgery;  Laterality: Right;  Right Parasagittal craniotomy for meningioma with Stealth   CRANIOTOMY Right 01/10/2019   Procedure: Right Parasagittal Craniotomy for Tumor;  Surgeon: Maeola Harman, MD;  Location: [facility] OR;  Service: Neurosurgery;  Laterality: Right;  Right parasagittal craniotomy for tumor   ESOPHAGOGASTRODUODENOSCOPY     JOINT REPLACEMENT Right 2018   right hip   REVERSE SHOULDER ARTHROPLASTY  Right 01/08/2021   Procedure: RIGHT REVERSE SHOULDER ARTHROPLASTY;  Surgeon: Cammy Copa, MD;  Location: [facility] OR;  Service: Orthopedics;  Laterality: Right;   right knee arthroscopy     SHOULDER ARTHROSCOPY Right 06/09/2020   Procedure: RIGHT SHOULDER ARTHROSCOPY AND DEBRIDEMENT;  Surgeon: Nadara Mustard, MD;  Location: [facility];  Service: Orthopedics;  Laterality: Right;    Allergies  No Known Allergies  Home Medications    Prior to Admission medications   Medication Sig Start Date End Date Taking? Authorizing Provider  Accu-Chek Softclix Lancets lancets Use as instructed 08/13/21   Hoy Register, MD  acetaminophen (TYLENOL) 500 MG tablet Take 1,000 mg by mouth every 6 (six) hours as needed for moderate pain or headache.    [provider]  albuterol (VENTOLIN HFA) 108 (90 Base) MCG/ACT inhaler Inhale 2 puffs into the lungs every 6 (six) hours as needed for wheezing or shortness of breath. 08/13/21   Newlin, Enobong, MD  amLODipine (NORVASC) 5 MG tablet TAKE 1 TABLET (5 MG TOTAL) BY MOUTH DAILY. TO LOWER BLOOD PRESSURE. STOP LOSARTAN 08/13/21   Hoy Register, MD  aspirin 81 MG chewable tablet Chew 81 mg by mouth daily.    [provider]  atorvastatin (LIPITOR) 20 MG tablet TAKE 1 TABLET (20 MG TOTAL) BY MOUTH DAILY. 12/08/21   Hoy Register, MD  Blood Glucose Monitoring Suppl (ACCU-CHEK GUIDE ME) w/Device KIT Use to check blood sugar once daily. 01/07/21   Hoy Register, MD  clonazePAM (KLONOPIN) 0.5 MG tablet TAKE 1 TABLET (0.5 MG TOTAL) BY MOUTH AT BEDTIME. 12/08/22   Vaslow, Georgeanna Lea, MD  diazepam (VALIUM) 2 MG tablet Take 1 tablet (2 mg total) by mouth 2 (two) times daily. 08/15/21   Wynetta Fines, MD  glucose blood (ACCU-CHEK GUIDE) test strip Use to check blood sugar once daily. 08/13/21   Hoy Register, MD  ketoconazole (NIZORAL) 2 % cream APPLY 1 APPLICATION. TOPICALLY DAILY. 02/28/22   Louann Sjogren, DPM  Lacosamide 100 MG TABS TAKE  1 TABLET (100 MG TOTAL) BY MOUTH IN THE MORNING AND AT BEDTIME. 12/08/22   Vaslow, Georgeanna Lea, MD  lamoTRIgine (LAMICTAL) 200 MG tablet TAKE 1 TABLET (200 MG TOTAL) BY MOUTH 2 (TWO) TIMES DAILY. 02/16/23   Henreitta Leber, MD  lamoTRIgine (LAMICTAL) 200 MG tablet Take 1 tablet (200 mg total) by mouth 2 (two) times daily. 02/20/23   Vaslow, Georgeanna Lea, MD  lidocaine (LIDODERM) 5 % Place 1 patch onto the skin daily. Remove & Discard patch within 12 hours or as directed by MD 04/07/22   Marita Kansas, PA-C  meclizine (ANTIVERT) 25 MG tablet Take 1 tablet (25 mg total) by mouth 3 (three) times daily as needed for dizziness. 01/04/22   Horton, Clabe Seal, DO  metFORMIN (GLUCOPHAGE) 500 MG tablet Take 1 tablet (500 mg total) by mouth 2 (two) times daily. 08/13/21   Hoy Register, MD  methocarbamol (ROBAXIN) 750 MG tablet Take 750 mg by mouth daily as needed for muscle spasms.    [provider]  omeprazole (PRILOSEC) 20 MG  capsule Take 20 mg by mouth daily. 08/29/22   [provider]  predniSONE (STERAPRED UNI-PAK 21 TAB) 10 MG (21) TBPK tablet Take as directed on package 01/11/23   Magnant, Joycie Peek, PA-C  propranolol (INDERAL) 10 MG tablet Take 1 tablet (10 mg total) by mouth 3 (three) times daily as needed (palpitations). 05/31/22   Swinyer, Zachary George, NP  tadalafil (CIALIS) 5 MG tablet Take 1 tablet (5 mg total) by mouth daily as needed for erectile dysfunction. 1 hour prior to sexual activity 01/17/22   Sondra Come, MD  tamsulosin (FLOMAX) 0.4 MG CAPS capsule Take 1 capsule (0.4 mg total) by mouth daily. 11/23/21   Michiel Cowboy A, PA-C  VITAMIN A PO Take 1 capsule by mouth daily.    [provider]    Physical Exam    Vital Signs:  Joshua [name] does not have vital signs available for review today.  Given telephonic nature of communication, physical exam is limited. AAOx3. NAD. Normal affect.  Speech and respirations are unlabored.  Accessory Clinical Findings     None  Assessment & Plan    1.  Preoperative Cardiovascular Risk Assessment:  According to the Revised Cardiac Risk Index (RCRI), his Perioperative Risk of Major Cardiac Event is (%): 0.4  His Functional Capacity in METs is: 6.7 according to the Duke Activity Status Index (DASI).   Per office protocol, if patient is without any new symptoms or concerns at the time of their virtual visit, he/she may hold ASA for 7 days prior to procedure. Please resume ASA as soon as possible postprocedure, at the discretion of the surgeon.     The patient was advised that if he develops new symptoms prior to surgery to contact our office to arrange for a follow-up visit, and he verbalized understanding.  Therefore, based on ACC/AHA guidelines, patient would be at acceptable risk for the planned procedure without further cardiovascular testing. I will route this recommendation to the requesting party via Epic fax function.   A copy of this note will be routed to requesting surgeon.  Time:   Today, I have spent 10 minutes with the patient with telehealth technology discussing medical history, symptoms, and management plan.     Joni Reining, NP  03/27/2023, 9:26 AM

## 2023-03-27 ENCOUNTER — Ambulatory Visit: Payer: 59 | Admitting: Surgical

## 2023-03-27 ENCOUNTER — Ambulatory Visit: Payer: 59 | Attending: Cardiovascular Disease

## 2023-03-27 DIAGNOSIS — Z01818 Encounter for other preprocedural examination: Secondary | ICD-10-CM

## 2023-03-27 DIAGNOSIS — Z01810 Encounter for preprocedural cardiovascular examination: Secondary | ICD-10-CM

## 2023-03-29 ENCOUNTER — Ambulatory Visit (INDEPENDENT_AMBULATORY_CARE_PROVIDER_SITE_OTHER): Payer: 59 | Admitting: Urology

## 2023-03-29 VITALS — BP 151/88 | HR 72

## 2023-03-29 DIAGNOSIS — R972 Elevated prostate specific antigen [PSA]: Secondary | ICD-10-CM | POA: Diagnosis not present

## 2023-03-29 DIAGNOSIS — C61 Malignant neoplasm of prostate: Secondary | ICD-10-CM | POA: Diagnosis not present

## 2023-03-29 MED ORDER — LEVOFLOXACIN 500 MG PO TABS
500.0000 mg | ORAL_TABLET | Freq: Once | ORAL | Status: AC
Start: 2023-03-29 — End: 2023-03-29
  Administered 2023-03-29: 500 mg via ORAL

## 2023-03-29 MED ORDER — GENTAMICIN SULFATE 40 MG/ML IJ SOLN
80.0000 mg | Freq: Once | INTRAMUSCULAR | Status: AC
Start: 2023-03-29 — End: 2023-03-29
  Administered 2023-03-29: 80 mg via INTRAMUSCULAR

## 2023-03-29 NOTE — Patient Instructions (Signed)
  Transrectal Prostate Biopsy Patient Education and Post Procedure Instructions    -Definition A prostate biopsy is the removal of a small amount of tissue from the prostate gland. The tissue is examined to determine whether there is cancer.  -Reasons for Procedure A prostate biopsy is usually done after an abnormal finding by: Digital rectal exam Prostate specific antigen (PSA) blood test A prostate biopsy is the only way to find out if cancer cells are present.  -Possible Complications Problems from the procedure are rare, but all procedures have some risk including: Infection Bruising or lengthy bleeding from the rectum, or in urine or semen Difficulty urinating Reactions to anesthesia Factors that may increase the risk of complications include: Smoking History of bleeding disorders or easy bruising Use of any medications, over-the-counter medications, or herbal supplements Sensitivity or allergy to latex, medications, or anesthesia.  -Prior to Procedure Talk to your doctor about your medications. Blood thinning medications including aspirin should be stopped 1 week prior to procedure. If prescribed by your cardiologist we may need approval before stopping medications. Use a Fleets enema 2 hours before the procedure. Can be purchased at your pharmacy. Antibiotics will be administered in the clinic prior to procedure.  Please make sure you eat a light meal prior to coming in for your appointment. This can help prevent lightheadedness during the procedure and upset stomach from antibiotics. Please bring someone with you to the procedure to drive you home.  -Anesthesia Transrectal biopsy: Local anesthesia--Just the area that is being operated on is numbed using an injectable anesthetic.  -Description of the Procedure Transrectal biopsy--Your doctor will insert a small ultrasound device into the rectum. This device will produce sound waves to create an image of the prostate.  These images will help guide placement of the needle. Your doctor will then insert the needle through the wall of the rectum and into the prostate gland. The procedure should take approximately 15-30 minutes.  -Will It Hurt? You may have discomfort and soreness at the biopsy site. Pain and discomfort after the procedure can be managed with medications.  -Postoperative Care When you return home after the procedure, do the following to help ensure a smooth recovery: Stay hydrated. Drink plenty of fluids for the next few days. Avoid difficult physical activity the day and evening of the procedure. Keep in mind that you may see blood in your urine, stool, or semen for several days. Resume any medications that were stopped when you are advised to do so.  After the sample is taken, it will be sent to a pathologist for examination under a microscope. This doctor will analyze the sample for cancer. You will be scheduled for an appointment to discuss results. If cancer is present, your doctor will work with you to develop a treatment plan.   -Call Your Doctor or Seek Immediate Medical Attention It is important to monitor your recovery. Alert your doctor to any problems. If any of the following occur, call your doctor or go to the emergency room: Fever 100.5 or greater within 1 week post procedure go directly to ER Call the office for: Blood in the urine more than 1 week or in semen for more than 6 weeks post-biopsy Pain that you cannot control with the medications you have been given Pain, burning, urgency, or frequency of urination Cough, shortness of breath, or chest pain- if severe go to ER Heavy rectal bleeding or bleeding that lasts more than 1 week after the biopsy If you   have any questions or concerns please contact our office at [phone]  [facility] [address]  [place], [place] [address] [phone]     

## 2023-03-29 NOTE — Progress Notes (Signed)
   03/29/23  Indication: Low risk prostate cancer on active surveillance, confirmatory biopsy  MRI Fusion Prostate Biopsy Procedure   Informed consent was obtained, and we discussed the risks of bleeding and infection/sepsis. A time out was performed to ensure correct patient identity.  Pre-Procedure: - Last PSA Level: 5.6 - Gentamicin and levaquin given for antibiotic prophylaxis -Prostate measured 51 g on MRI, PSA density 0.11 - No significant hypoechoic or median lobe noted  Procedure: - Prostate block performed using 10 cc 1% lidocaine  - MRI fusion biopsy was performed, and 3 biopsies were taken from the ROI PIRADS 4 lesion located left posterior lateral peripheral zone mid gland and apex - Standard biopsies taken from sextant areas, 12 under ultrasound guidance. - Total of 15 cores taken  Post-Procedure: - Patient tolerated the procedure well - He was counseled to seek immediate medical attention if experiences significant bleeding, fevers, or severe pain - Return in one week to discuss biopsy results  Assessment/ Plan: Will follow up in 1-2 weeks to discuss pathology  Legrand Rams, MD 03/29/2023

## 2023-03-30 ENCOUNTER — Inpatient Hospital Stay
Admission: EM | Admit: 2023-03-30 | Discharge: 2023-04-02 | DRG: 862 | Disposition: A | Discharge status: Home / Self Care | Payer: 59 | Attending: Student | Admitting: Student

## 2023-03-30 ENCOUNTER — Other Ambulatory Visit: Payer: Self-pay

## 2023-03-30 ENCOUNTER — Emergency Department: Payer: 59

## 2023-03-30 ENCOUNTER — Encounter: Payer: Self-pay | Admitting: *Deleted

## 2023-03-30 DIAGNOSIS — Z833 Family history of diabetes mellitus: Secondary | ICD-10-CM

## 2023-03-30 DIAGNOSIS — I4510 Unspecified right bundle-branch block: Secondary | ICD-10-CM | POA: Diagnosis present

## 2023-03-30 DIAGNOSIS — I471 Supraventricular tachycardia, unspecified: Secondary | ICD-10-CM

## 2023-03-30 DIAGNOSIS — E1140 Type 2 diabetes mellitus with diabetic neuropathy, unspecified: Secondary | ICD-10-CM | POA: Diagnosis not present

## 2023-03-30 DIAGNOSIS — A4151 Sepsis due to Escherichia coli [E. coli]: Secondary | ICD-10-CM | POA: Diagnosis present

## 2023-03-30 DIAGNOSIS — E7800 Pure hypercholesterolemia, unspecified: Secondary | ICD-10-CM | POA: Diagnosis present

## 2023-03-30 DIAGNOSIS — Z1611 Resistance to penicillins: Secondary | ICD-10-CM | POA: Diagnosis present

## 2023-03-30 DIAGNOSIS — Y848 Other medical procedures as the cause of abnormal reaction of the patient, or of later complication, without mention of misadventure at the time of the procedure: Secondary | ICD-10-CM | POA: Diagnosis present

## 2023-03-30 DIAGNOSIS — R0602 Shortness of breath: Secondary | ICD-10-CM | POA: Diagnosis not present

## 2023-03-30 DIAGNOSIS — E8729 Other acidosis: Secondary | ICD-10-CM

## 2023-03-30 DIAGNOSIS — Z86018 Personal history of other benign neoplasm: Secondary | ICD-10-CM

## 2023-03-30 DIAGNOSIS — N410 Acute prostatitis: Secondary | ICD-10-CM | POA: Diagnosis present

## 2023-03-30 DIAGNOSIS — Z7984 Long term (current) use of oral hypoglycemic drugs: Secondary | ICD-10-CM | POA: Diagnosis not present

## 2023-03-30 DIAGNOSIS — E872 Acidosis, unspecified: Secondary | ICD-10-CM | POA: Diagnosis not present

## 2023-03-30 DIAGNOSIS — Z9889 Other specified postprocedural states: Secondary | ICD-10-CM | POA: Diagnosis not present

## 2023-03-30 DIAGNOSIS — Z96611 Presence of right artificial shoulder joint: Secondary | ICD-10-CM | POA: Diagnosis present

## 2023-03-30 DIAGNOSIS — K219 Gastro-esophageal reflux disease without esophagitis: Secondary | ICD-10-CM | POA: Diagnosis present

## 2023-03-30 DIAGNOSIS — F17290 Nicotine dependence, other tobacco product, uncomplicated: Secondary | ICD-10-CM | POA: Diagnosis present

## 2023-03-30 DIAGNOSIS — N400 Enlarged prostate without lower urinary tract symptoms: Secondary | ICD-10-CM | POA: Diagnosis present

## 2023-03-30 DIAGNOSIS — R6520 Severe sepsis without septic shock: Secondary | ICD-10-CM | POA: Diagnosis present

## 2023-03-30 DIAGNOSIS — E119 Type 2 diabetes mellitus without complications: Secondary | ICD-10-CM

## 2023-03-30 DIAGNOSIS — A419 Sepsis, unspecified organism: Secondary | ICD-10-CM | POA: Diagnosis not present

## 2023-03-30 DIAGNOSIS — R509 Fever, unspecified: Secondary | ICD-10-CM | POA: Diagnosis not present

## 2023-03-30 DIAGNOSIS — Z8249 Family history of ischemic heart disease and other diseases of the circulatory system: Secondary | ICD-10-CM | POA: Diagnosis not present

## 2023-03-30 DIAGNOSIS — Z96641 Presence of right artificial hip joint: Secondary | ICD-10-CM | POA: Diagnosis not present

## 2023-03-30 DIAGNOSIS — I959 Hypotension, unspecified: Secondary | ICD-10-CM

## 2023-03-30 DIAGNOSIS — Z7982 Long term (current) use of aspirin: Secondary | ICD-10-CM | POA: Diagnosis not present

## 2023-03-30 DIAGNOSIS — E1165 Type 2 diabetes mellitus with hyperglycemia: Secondary | ICD-10-CM | POA: Diagnosis not present

## 2023-03-30 DIAGNOSIS — I2489 Other forms of acute ischemic heart disease: Secondary | ICD-10-CM

## 2023-03-30 DIAGNOSIS — R310 Gross hematuria: Secondary | ICD-10-CM | POA: Diagnosis present

## 2023-03-30 DIAGNOSIS — R9431 Abnormal electrocardiogram [ECG] [EKG]: Secondary | ICD-10-CM | POA: Diagnosis not present

## 2023-03-30 DIAGNOSIS — R569 Unspecified convulsions: Secondary | ICD-10-CM | POA: Diagnosis not present

## 2023-03-30 DIAGNOSIS — T8144XA Sepsis following a procedure, initial encounter: Secondary | ICD-10-CM | POA: Diagnosis not present

## 2023-03-30 DIAGNOSIS — N9989 Other postprocedural complications and disorders of genitourinary system: Secondary | ICD-10-CM | POA: Diagnosis not present

## 2023-03-30 DIAGNOSIS — Z743 Need for continuous supervision: Secondary | ICD-10-CM | POA: Diagnosis not present

## 2023-03-30 DIAGNOSIS — I4719 Other supraventricular tachycardia: Secondary | ICD-10-CM | POA: Diagnosis not present

## 2023-03-30 DIAGNOSIS — R319 Hematuria, unspecified: Secondary | ICD-10-CM

## 2023-03-30 DIAGNOSIS — Z1152 Encounter for screening for COVID-19: Secondary | ICD-10-CM | POA: Diagnosis not present

## 2023-03-30 DIAGNOSIS — Z79899 Other long term (current) drug therapy: Secondary | ICD-10-CM

## 2023-03-30 DIAGNOSIS — C61 Malignant neoplasm of prostate: Secondary | ICD-10-CM | POA: Diagnosis not present

## 2023-03-30 DIAGNOSIS — R7989 Other specified abnormal findings of blood chemistry: Secondary | ICD-10-CM | POA: Diagnosis present

## 2023-03-30 DIAGNOSIS — R531 Weakness: Secondary | ICD-10-CM | POA: Diagnosis not present

## 2023-03-30 DIAGNOSIS — R000 Tachycardia, unspecified: Secondary | ICD-10-CM | POA: Diagnosis not present

## 2023-03-30 DIAGNOSIS — I10 Essential (primary) hypertension: Secondary | ICD-10-CM | POA: Diagnosis present

## 2023-03-30 DIAGNOSIS — R778 Other specified abnormalities of plasma proteins: Secondary | ICD-10-CM | POA: Diagnosis not present

## 2023-03-30 DIAGNOSIS — Z86011 Personal history of benign neoplasm of the brain: Secondary | ICD-10-CM

## 2023-03-30 DIAGNOSIS — T8140XA Infection following a procedure, unspecified, initial encounter: Secondary | ICD-10-CM | POA: Diagnosis not present

## 2023-03-30 DIAGNOSIS — K429 Umbilical hernia without obstruction or gangrene: Secondary | ICD-10-CM | POA: Diagnosis not present

## 2023-03-30 LAB — URINALYSIS, W/ REFLEX TO CULTURE (INFECTION SUSPECTED)
Bacteria, UA: NONE SEEN
RBC / HPF: >50 RBC/hpf (ref 0–5)
Squamous Epithelial / HPF: 0 /HPF (ref 0–5)

## 2023-03-30 LAB — COMPREHENSIVE METABOLIC PANEL
ALT: 22 U/L (ref 0–44)
AST: 29 U/L (ref 15–41)
Albumin: 4.1 g/dL (ref 3.5–5.0)
Alkaline Phosphatase: 74 U/L (ref 38–126)
Anion gap: 17 — ABNORMAL HIGH (ref 5–15)
BUN: 20 mg/dL (ref 8–23)
CO2: 17 mmol/L — ABNORMAL LOW (ref 22–32)
Calcium: 9 mg/dL (ref 8.9–10.3)
Chloride: 103 mmol/L (ref 98–111)
Creatinine, Ser: 1 mg/dL (ref 0.61–1.24)
GFR, Estimated: >60 mL/min (ref >60)
Glucose, Bld: 128 mg/dL — ABNORMAL HIGH (ref 70–99)
Potassium: 3.5 mmol/L (ref 3.5–5.1)
Sodium: 137 mmol/L (ref 135–145)
Total Bilirubin: 0.6 mg/dL (ref 0.3–1.2)
Total Protein: 7.1 g/dL (ref 6.5–8.1)

## 2023-03-30 LAB — CBC WITH DIFFERENTIAL/PLATELET
Abs Immature Granulocytes: 0.01 10*3/uL (ref 0.00–0.07)
Basophils Absolute: 0 10*3/uL (ref 0.0–0.1)
Basophils Relative: 1 %
Eosinophils Absolute: 0 10*3/uL (ref 0.0–0.5)
Eosinophils Relative: 0 %
HCT: 45.9 % (ref 39.0–52.0)
Hemoglobin: 15.7 g/dL (ref 13.0–17.0)
Immature Granulocytes: 0 %
Lymphocytes Relative: 5 %
Lymphs Abs: 0.3 10*3/uL — ABNORMAL LOW (ref 0.7–4.0)
MCH: 31.3 pg (ref 26.0–34.0)
MCHC: 34.2 g/dL (ref 30.0–36.0)
MCV: 91.4 fL (ref 80.0–100.0)
Monocytes Absolute: 0 10*3/uL — ABNORMAL LOW (ref 0.1–1.0)
Monocytes Relative: 0 %
Neutro Abs: 5.1 10*3/uL (ref 1.7–7.7)
Neutrophils Relative %: 94 %
Platelets: 201 10*3/uL (ref 150–400)
RBC: 5.02 MIL/uL (ref 4.22–5.81)
RDW: 12.2 % (ref 11.5–15.5)
WBC: 5.4 10*3/uL (ref 4.0–10.5)
nRBC: 0 % (ref 0.0–0.2)

## 2023-03-30 LAB — LACTIC ACID, PLASMA
Lactic Acid, Venous: 6.5 mmol/L (ref 0.5–1.9)
Lactic Acid, Venous: 3.3 mmol/L (ref 0.5–1.9)

## 2023-03-30 LAB — PROTIME-INR
INR: 1.1 (ref 0.8–1.2)
Prothrombin Time: 14.4 s (ref 11.4–15.2)

## 2023-03-30 LAB — TROPONIN I (HIGH SENSITIVITY)
Troponin I (High Sensitivity): 29 ng/L — ABNORMAL HIGH (ref <18)
Troponin I (High Sensitivity): 130 ng/L (ref <18)

## 2023-03-30 MED ORDER — VANCOMYCIN HCL IN DEXTROSE 1-5 GM/200ML-% IV SOLN
1000.0000 mg | Freq: Once | INTRAVENOUS | Status: AC
  Administered 2023-03-30: 1000 mg via INTRAVENOUS
  Filled 2023-03-30: qty 200

## 2023-03-30 MED ORDER — LACTATED RINGERS IV BOLUS (SEPSIS)
2000.0000 mL | Freq: Once | INTRAVENOUS | Status: AC
  Administered 2023-03-30: 2000 mL via INTRAVENOUS

## 2023-03-30 MED ORDER — ASPIRIN 81 MG PO CHEW
324.0000 mg | CHEWABLE_TABLET | Freq: Once | ORAL | Status: AC
  Administered 2023-03-30: 324 mg via ORAL
  Filled 2023-03-30: qty 4

## 2023-03-30 MED ORDER — ACETAMINOPHEN 325 MG PO TABS
650.0000 mg | ORAL_TABLET | Freq: Once | ORAL | Status: AC
  Administered 2023-03-30: 650 mg via ORAL
  Filled 2023-03-30: qty 2

## 2023-03-30 MED ORDER — IOHEXOL 300 MG/ML  SOLN
100.0000 mL | Freq: Once | INTRAMUSCULAR | Status: AC | PRN
  Administered 2023-03-30: 100 mL via INTRAVENOUS

## 2023-03-30 MED ORDER — VANCOMYCIN HCL 750 MG/150ML IV SOLN
750.0000 mg | Freq: Once | INTRAVENOUS | Status: AC
  Administered 2023-03-30: 750 mg via INTRAVENOUS
  Filled 2023-03-30: qty 150

## 2023-03-30 MED ORDER — SODIUM CHLORIDE 0.9 % IV BOLUS
1000.0000 mL | Freq: Once | INTRAVENOUS | Status: AC
  Administered 2023-03-30: 1000 mL via INTRAVENOUS

## 2023-03-30 MED ORDER — SODIUM CHLORIDE 0.9 % IV SOLN
2.0000 g | Freq: Once | INTRAVENOUS | Status: AC
  Administered 2023-03-30: 2 g via INTRAVENOUS
  Filled 2023-03-30: qty 12.5

## 2023-03-30 MED ORDER — ONDANSETRON HCL 4 MG/2ML IJ SOLN
4.0000 mg | Freq: Once | INTRAMUSCULAR | Status: AC
  Administered 2023-03-30: 4 mg via INTRAVENOUS

## 2023-03-30 NOTE — Progress Notes (Signed)
PHARMACY -  BRIEF ANTIBIOTIC NOTE   Pharmacy has received consult(s) for Vancomycin from an ED provider.  The patient's profile has been reviewed for ht/wt/allergies/indication/available labs.    One time order(s) placed for Vancomycin 1750 mg IV.  Further antibiotics/pharmacy consults should be ordered by admitting physician if indicated.                       Thank you, Jerrilyn Cairo, PharmD 03/30/2023 8:44 PM

## 2023-03-30 NOTE — ED Triage Notes (Addendum)
Pt brought in via ems with tremors.  Pt had prostate surgery 2 days ago.  Pt reports drinking etoh today.    Fever began this evening.  Pt reports feeling weak.  Pt to room 8

## 2023-03-30 NOTE — ED Provider Notes (Addendum)
[facility] Provider Note    Event Date/Time   First MD Initiated Contact with Patient 03/30/23 2016     (approximate)   History   No chief complaint on file.   HPI  Joshua [name] is a 62 y.o. male   Past medical history of hypertension, hyperlipidemia, prediabetic who presents to the Emergency Department with fever, weakness, in the setting of having a prostate surgery biopsy yesterday.  He has some postsurgical pain in the area of the biopsy, hematuria, as well as some shortness of breath but denies chest pain or cough or congestion.  No known sick contacts.  He denies abdominal pain.  He arrives awake and alert however concerning for sepsis given his febrile 102.7, tachycardic in the 150s and tachypneic in the 28.  His oxygen saturation is normal.    External Medical Documents Reviewed: Urology note from Dr. Richardo Hanks yesterday detailing his biopsy of prostate      Physical Exam   Triage Vital Signs: ED Triage Vitals  Encounter Vitals Group     BP 03/30/23 1953 (!) 111/59     Systolic BP Percentile --      Diastolic BP Percentile --      Pulse Rate 03/30/23 1953 (!) 151     Resp 03/30/23 1953 (!) 22     Temp 03/30/23 1953 (!) 103.1 F (39.5 C)     Temp Source 03/30/23 1953 Oral     SpO2 03/30/23 1953 96 %     Weight 03/30/23 1955 170 lb (77.1 kg)     Height 03/30/23 1955 5\' 9"  (1.753 m)     Head Circumference --      Peak Flow --      Pain Score 03/30/23 1955 8     Pain Loc --      Pain Education --      Exclude from Growth Chart --     Most recent vital signs: Vitals:   03/30/23 2230 03/30/23 2256  BP: 109/71   Pulse: (!) 123   Resp: (!) 27   Temp:  100.1 F (37.8 C)  SpO2: 94%     General: Awake, no distress.  CV:  Good peripheral perfusion.  Resp:  Normal effort.  Abd:  No distention.  Other:  Febrile tachycardic and tachypneic.  Soft nontender abdomen.  Lungs are clear to auscultation without focality or wheezing and  neck is supple with full range of motion he is a pleasant gentleman who is answering questions appropriately.   ED Results / Procedures / Treatments   Labs (all labs ordered are listed, but only abnormal results are displayed) Labs Reviewed  COMPREHENSIVE METABOLIC PANEL - Abnormal; Notable for the following components:      Result Value   CO2 17 (*)    Glucose, Bld 128 (*)    Anion gap 17 (*)    All other components within normal limits  LACTIC ACID, PLASMA - Abnormal; Notable for the following components:   Lactic Acid, Venous 6.5 (*)    All other components within normal limits  LACTIC ACID, PLASMA - Abnormal; Notable for the following components:   Lactic Acid, Venous 3.3 (*)    All other components within normal limits  CBC WITH DIFFERENTIAL/PLATELET - Abnormal; Notable for the following components:   Lymphs Abs 0.3 (*)    Monocytes Absolute 0.0 (*)    All other components within normal limits  URINALYSIS, W/ REFLEX TO CULTURE (INFECTION SUSPECTED) - Abnormal;  Notable for the following components:   Color, Urine RED (*)    APPearance TURBID (*)    Glucose, UA   (*)    Value: TEST NOT REPORTED DUE TO COLOR INTERFERENCE OF URINE PIGMENT   Hgb urine dipstick   (*)    Value: TEST NOT REPORTED DUE TO COLOR INTERFERENCE OF URINE PIGMENT   Bilirubin Urine   (*)    Value: TEST NOT REPORTED DUE TO COLOR INTERFERENCE OF URINE PIGMENT   Ketones, ur   (*)    Value: TEST NOT REPORTED DUE TO COLOR INTERFERENCE OF URINE PIGMENT   Protein, ur   (*)    Value: TEST NOT REPORTED DUE TO COLOR INTERFERENCE OF URINE PIGMENT   Nitrite   (*)    Value: TEST NOT REPORTED DUE TO COLOR INTERFERENCE OF URINE PIGMENT   Leukocytes,Ua   (*)    Value: TEST NOT REPORTED DUE TO COLOR INTERFERENCE OF URINE PIGMENT   All other components within normal limits  TROPONIN I (HIGH SENSITIVITY) - Abnormal; Notable for the following components:   Troponin I (High Sensitivity) 29 (*)    All other components  within normal limits  CULTURE, BLOOD (ROUTINE X 2)  CULTURE, BLOOD (ROUTINE X 2)  PROTIME-INR  TROPONIN I (HIGH SENSITIVITY)     I ordered and reviewed the above labs they are notable for markedly elevated lactic at 6.5  EKG  ED ECG REPORT I, Pilar Jarvis, the attending physician, personally viewed and interpreted this ECG.   Date: 03/30/2023  EKG Time: 2011  Rate: 159  Rhythm: sinus tachycardia  Axis: nl  Intervals:none  ST&T Change: no stemi    RADIOLOGY I independently reviewed and interpreted chest x-ray and I see no obvious focality or pneumothorax I also reviewed radiologist's formal read.   PROCEDURES:  Critical Care performed: Yes, see critical care procedure note(s)  .Critical Care  Performed by: Pilar Jarvis, MD Authorized by: Pilar Jarvis, MD   Critical care provider statement:    Critical care time (minutes):  30   Critical care was time spent personally by me on the following activities:  Development of treatment plan with patient or surrogate, discussions with consultants, evaluation of patient's response to treatment, examination of patient, ordering and review of laboratory studies, ordering and review of radiographic studies, ordering and performing treatments and interventions, pulse oximetry, re-evaluation of patient's condition and review of old charts    MEDICATIONS ORDERED IN ED: Medications  sodium chloride 0.9 % bolus 1,000 mL (has no administration in time range)  lactated ringers bolus 2,000 mL (0 mLs Intravenous Stopped 03/30/23 2150)  acetaminophen (TYLENOL) tablet 650 mg (650 mg Oral Given 03/30/23 2042)  ceFEPIme (MAXIPIME) 2 g in sodium chloride 0.9 % 100 mL IVPB (0 g Intravenous Stopped 03/30/23 2143)  vancomycin (VANCOCIN) IVPB 1000 mg/200 mL premix (0 mg Intravenous Stopped 03/30/23 2252)    Followed by  vancomycin (VANCOREADY) IVPB 750 mg/150 mL (0 mg Intravenous Stopped 03/30/23 2252)  iohexol (OMNIPAQUE) 300 MG/ML solution 100 mL (100  mLs Intravenous Contrast Given 03/30/23 2115)  ondansetron (ZOFRAN) injection 4 mg (4 mg Intravenous Given 03/30/23 2157)    External physician / consultants:  I spoke with hospitalist for admission and regarding care plan for this patient.   IMPRESSION / MDM / ASSESSMENT AND PLAN / ED COURSE  I reviewed the triage vital signs and the nursing notes.  Patient's presentation is most consistent with acute presentation with potential threat to life or bodily function.  Differential diagnosis includes, but is not limited to, sepsis, prostate infection, intra-abdominal infection, urinary tract infection, bacterial pneumonia   The patient is on the cardiac monitor to evaluate for evidence of arrhythmia and/or significant heart rate changes.  MDM:    This is a patient who is febrile, generalized weakness after prostate biopsy yesterday.  He has pain in the area of his prostate biopsy, as well as some shortness of breath.  Will assess with sepsis labs, blood cultures, lactic, urinalysis, and also get a CT scan of the abdomen pelvis to look for evidence of abscess or other intra-abdominal/pelvic infections postsurgically and also get a CT of the chest given his shortness of breath look for infectious sources.  He was given 30 cc/kg ideal body weight crystalloid bolus as well as broad-spectrum antibiotics.  Looks like prostatitis.  I notified Dr. Irish Elders of urology.  Admission.  Sepsis reevaluation -- patient looks markedly improved, blood pressure remains normotensive, fever improved and his pulse is now 120s from 150s.      FINAL CLINICAL IMPRESSION(S) / ED DIAGNOSES   Final diagnoses:  Severe sepsis (HCC)  Acute prostatitis     Rx / DC Orders   ED Discharge Orders     None        Note:  This document was prepared using Dragon voice recognition software and may include unintentional dictation errors.    Pilar Jarvis, MD 03/30/23 2136     Pilar Jarvis, MD 03/30/23 [phone]

## 2023-03-30 NOTE — H&P (Signed)
History and Physical    Patient: Joshua [name]:[id number] DOB: 01-15-1961 DOA: 03/30/2023 DOS: the patient was seen and examined on 03/30/2023 PCP: Alease Medina, MD  Patient coming from: Home  Chief Complaint: Fever, pain following prostate biopsy   HPI: Joshua [name] is a 62 y.o. male with medical history significant for Malignant meningioma s/p resection 2014 and 2020, seizures, HTN, anxiety, palpitations, DM, recurrent chest pain with prior negative workup, BPH s/p prostate biopsy on 9/11, who presents to the ED with concerns for continued pain since the procedure, hematuria and now fever.  He denies nausea, vomiting. ED course and data review:Tmax 103.1 with pulse up to 157 tachypneic to the high 20s BP Nadir of 109/71 O2 sats in the mid 90s on room air. Labs CBC within normal limits but lactic acid 6.5--3.3. CMP notable for bicarb of 17 and anion gap of 17 Troponin 29--130 Urinalysis unreadable due to red coloration/hematuria EKG, personally viewed and interpreted: EKG #1 supraventricular tachycardia at 159 EKG #2 following fluid bolus-sinus tachycardia with incomplete RBBB  CT chest abdomen and pelvis showed the following: IMPRESSION: 1. Negative for acute airspace disease. 2. Enlarged prostate. Suspect haziness at the level of prostate but slightly limited due to streak artifact from right hip hardware. Question post biopsy change versus prostatitis. 3. Thick-walled bladder, cystitis versus chronic obstruction 4. Heterogenous appearance of C7, T1 and T2 on reconstructed images, not confirmed on corresponding axial views, this could be artifactual but recommend dedicated cervical CT for further assessment. 5. Aortic atherosclerosis.  The ED provider spoke with urologist, Dr. Gabrielle Dare who will see patient in the a.m. Patient started on sepsis fluid bolus, vancomycin and cefepime Hospitalist consulted for admission.     Past Medical History:  Diagnosis Date    Anxiety    Brain tumor (HCC) 02/20/2013   brain tumor removed in March 2014, Dr Rush Farmer   Diffuse idiopathic skeletal hyperostosis 06/05/2013   Dizziness    Enlarged prostate    GERD (gastroesophageal reflux disease)    Headache(784.0)    scattered   High cholesterol    Hypertension    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   Meningioma (HCC)    Meningioma, recurrent of brain (HCC) 01/31/2019   Neuropathy 12/05/2019   Pre-diabetes    Rotator cuff tear    right   Seizures (HCC)    11/27/19 last sz 1 wk ago   Past Surgical History:  Procedure Laterality Date   APPLICATION OF CRANIAL NAVIGATION N/A 01/10/2019   Procedure: APPLICATION OF CRANIAL NAVIGATION;  Surgeon: Maeola Harman, MD;  Location: [facility] OR;  Service: Neurosurgery;  Laterality: N/A;   CATARACT EXTRACTION Right    CRANIOTOMY Right 11/06/2012   Procedure: CRANIOTOMY TUMOR EXCISION;  Surgeon: Maeola Harman, MD;  Location: [facility];  Service: Neurosurgery;  Laterality: Right;  Right Parasagittal craniotomy for meningioma with Stealth   CRANIOTOMY Right 01/10/2019   Procedure: Right Parasagittal Craniotomy for Tumor;  Surgeon: Maeola Harman, MD;  Location: [facility] OR;  Service: Neurosurgery;  Laterality: Right;  Right parasagittal craniotomy for tumor   ESOPHAGOGASTRODUODENOSCOPY     JOINT REPLACEMENT Right 2018   right hip   REVERSE SHOULDER ARTHROPLASTY Right 01/08/2021   Procedure: RIGHT REVERSE SHOULDER ARTHROPLASTY;  Surgeon: Cammy Copa, MD;  Location: [facility] OR;  Service: Orthopedics;  Laterality: Right;   right knee arthroscopy     SHOULDER ARTHROSCOPY Right 06/09/2020   Procedure: RIGHT SHOULDER ARTHROSCOPY AND DEBRIDEMENT;  Surgeon: Aldean Baker  V, MD;  Location: [facility];  Service: Orthopedics;  Laterality: Right;   Social History:  reports that he quit smoking about 4 years ago. His smoking use included cigarettes. He started smoking about 19 years ago. He has a 0.2 pack-year  smoking history. He has been exposed to tobacco smoke. He has never used smokeless tobacco. He reports current alcohol use. He reports that he does not use drugs.  No Known Allergies  Family History  Problem Relation Age of Onset   Hypertension Mother    Dementia Mother    Diabetes Mother    Hypertension Father    CAD Father    Diabetes Father    CAD Brother     Prior to Admission medications   Medication Sig Start Date End Date Taking? Authorizing Provider  Accu-Chek Softclix Lancets lancets Use as instructed 08/13/21   Hoy Register, MD  acetaminophen (TYLENOL) 500 MG tablet Take 1,000 mg by mouth every 6 (six) hours as needed for moderate pain or headache.    [provider]  albuterol (VENTOLIN HFA) 108 (90 Base) MCG/ACT inhaler Inhale 2 puffs into the lungs every 6 (six) hours as needed for wheezing or shortness of breath. 08/13/21   Newlin, Enobong, MD  amLODipine (NORVASC) 5 MG tablet TAKE 1 TABLET (5 MG TOTAL) BY MOUTH DAILY. TO LOWER BLOOD PRESSURE. STOP LOSARTAN 08/13/21   Hoy Register, MD  aspirin 81 MG chewable tablet Chew 81 mg by mouth daily.    [provider]  atorvastatin (LIPITOR) 20 MG tablet TAKE 1 TABLET (20 MG TOTAL) BY MOUTH DAILY. 12/08/21   Hoy Register, MD  Blood Glucose Monitoring Suppl (ACCU-CHEK GUIDE ME) w/Device KIT Use to check blood sugar once daily. 01/07/21   Hoy Register, MD  clonazePAM (KLONOPIN) 0.5 MG tablet TAKE 1 TABLET (0.5 MG TOTAL) BY MOUTH AT BEDTIME. 12/08/22   Vaslow, Georgeanna Lea, MD  diazepam (VALIUM) 2 MG tablet Take 1 tablet (2 mg total) by mouth 2 (two) times daily. 08/15/21   Wynetta Fines, MD  glucose blood (ACCU-CHEK GUIDE) test strip Use to check blood sugar once daily. 08/13/21   Hoy Register, MD  ketoconazole (NIZORAL) 2 % cream APPLY 1 APPLICATION. TOPICALLY DAILY. 02/28/22   Louann Sjogren, DPM  Lacosamide 100 MG TABS TAKE 1 TABLET (100 MG TOTAL) BY MOUTH IN THE MORNING AND AT BEDTIME. 12/08/22   Vaslow,  Georgeanna Lea, MD  lamoTRIgine (LAMICTAL) 200 MG tablet TAKE 1 TABLET (200 MG TOTAL) BY MOUTH 2 (TWO) TIMES DAILY. 02/16/23   Henreitta Leber, MD  lamoTRIgine (LAMICTAL) 200 MG tablet Take 1 tablet (200 mg total) by mouth 2 (two) times daily. 02/20/23   Vaslow, Georgeanna Lea, MD  lidocaine (LIDODERM) 5 % Place 1 patch onto the skin daily. Remove & Discard patch within 12 hours or as directed by MD 04/07/22   Marita Kansas, PA-C  meclizine (ANTIVERT) 25 MG tablet Take 1 tablet (25 mg total) by mouth 3 (three) times daily as needed for dizziness. 01/04/22   Horton, Clabe Seal, DO  metFORMIN (GLUCOPHAGE) 500 MG tablet Take 1 tablet (500 mg total) by mouth 2 (two) times daily. 08/13/21   Hoy Register, MD  methocarbamol (ROBAXIN) 750 MG tablet Take 750 mg by mouth daily as needed for muscle spasms.    [provider]  omeprazole (PRILOSEC) 20 MG capsule Take 20 mg by mouth daily. 08/29/22   [provider]  predniSONE (STERAPRED UNI-PAK 21 TAB) 10 MG (21) TBPK  tablet Take as directed on package 01/11/23   Magnant, Joycie Peek, PA-C  propranolol (INDERAL) 10 MG tablet Take 1 tablet (10 mg total) by mouth 3 (three) times daily as needed (palpitations). 05/31/22   Swinyer, Zachary George, NP  tadalafil (CIALIS) 5 MG tablet Take 1 tablet (5 mg total) by mouth daily as needed for erectile dysfunction. 1 hour prior to sexual activity 01/17/22   Sondra Come, MD  tamsulosin (FLOMAX) 0.4 MG CAPS capsule Take 1 capsule (0.4 mg total) by mouth daily. 11/23/21   Michiel Cowboy A, PA-C  VITAMIN A PO Take 1 capsule by mouth daily.    [provider]    Physical Exam: Vitals:   03/30/23 2130 03/30/23 2200 03/30/23 2230 03/30/23 2256  BP: 117/71 121/73 109/71   Pulse: (!) 133 (!) 128 (!) 123   Resp: (!) 28 19 (!) 27   Temp:    100.1 F (37.8 C)  TempSrc:    Oral  SpO2: 92% 94% 94%   Weight:      Height:       Physical Exam Vitals and nursing note reviewed.  Constitutional:      General: He is not  in acute distress. HENT:     Head: Normocephalic and atraumatic.  Cardiovascular:     Rate and Rhythm: Regular rhythm. Tachycardia present.     Heart sounds: Normal heart sounds.  Pulmonary:     Effort: Pulmonary effort is normal.     Breath sounds: Normal breath sounds.  Abdominal:     Palpations: Abdomen is soft.     Tenderness: There is no abdominal tenderness.  Neurological:     Mental Status: Mental status is at baseline.     Labs on Admission: I have personally reviewed following labs and imaging studies  CBC: Recent Labs  Lab 03/30/23 2020  WBC 5.4  NEUTROABS 5.1  HGB 15.7  HCT 45.9  MCV 91.4  PLT 201   Basic Metabolic Panel: Recent Labs  Lab 03/30/23 2020  NA 137  K 3.5  CL 103  CO2 17*  GLUCOSE 128*  BUN 20  CREATININE 1.00  CALCIUM 9.0   GFR: Estimated Creatinine Clearance: 76.6 mL/min (by C-G formula based on SCr of 1 mg/dL). Liver Function Tests: Recent Labs  Lab 03/30/23 2020  AST 29  ALT 22  ALKPHOS 74  BILITOT 0.6  PROT 7.1  ALBUMIN 4.1   No results for input(s): "LIPASE", "AMYLASE" in the last 168 hours. No results for input(s): "AMMONIA" in the last 168 hours. Coagulation Profile: Recent Labs  Lab 03/30/23 2020  INR 1.1   Cardiac Enzymes: No results for input(s): "CKTOTAL", "CKMB", "CKMBINDEX", "TROPONINI" in the last 168 hours. BNP (last 3 results) No results for input(s): "PROBNP" in the last 8760 hours. HbA1C: No results for input(s): "HGBA1C" in the last 72 hours. CBG: No results for input(s): "GLUCAP" in the last 168 hours. Lipid Profile: No results for input(s): "CHOL", "HDL", "LDLCALC", "TRIG", "CHOLHDL", "LDLDIRECT" in the last 72 hours. Thyroid Function Tests: No results for input(s): "TSH", "T4TOTAL", "FREET4", "T3FREE", "THYROIDAB" in the last 72 hours. Anemia Panel: No results for input(s): "VITAMINB12", "FOLATE", "FERRITIN", "TIBC", "IRON", "RETICCTPCT" in the last 72 hours. Urine analysis:    Component  Value Date/Time   COLORURINE RED (A) 03/30/2023 2024   APPEARANCEUR TURBID (A) 03/30/2023 2024   APPEARANCEUR Clear 05/20/2021 1005   LABSPEC  03/30/2023 2024    TEST NOT REPORTED DUE TO COLOR INTERFERENCE OF URINE PIGMENT  PHURINE  03/30/2023 2024    TEST NOT REPORTED DUE TO COLOR INTERFERENCE OF URINE PIGMENT   GLUCOSEU (A) 03/30/2023 2024    TEST NOT REPORTED DUE TO COLOR INTERFERENCE OF URINE PIGMENT   HGBUR (A) 03/30/2023 2024    TEST NOT REPORTED DUE TO COLOR INTERFERENCE OF URINE PIGMENT   BILIRUBINUR (A) 03/30/2023 2024    TEST NOT REPORTED DUE TO COLOR INTERFERENCE OF URINE PIGMENT   BILIRUBINUR Negative 05/20/2021 1005   KETONESUR (A) 03/30/2023 2024    TEST NOT REPORTED DUE TO COLOR INTERFERENCE OF URINE PIGMENT   PROTEINUR (A) 03/30/2023 2024    TEST NOT REPORTED DUE TO COLOR INTERFERENCE OF URINE PIGMENT   UROBILINOGEN 0.2 07/01/2020 1502   UROBILINOGEN 0.2 09/08/2019 1541   NITRITE (A) 03/30/2023 2024    TEST NOT REPORTED DUE TO COLOR INTERFERENCE OF URINE PIGMENT   LEUKOCYTESUR (A) 03/30/2023 2024    TEST NOT REPORTED DUE TO COLOR INTERFERENCE OF URINE PIGMENT    Radiological Exams on Admission: CT CHEST ABDOMEN PELVIS W CONTRAST  Result Date: 03/30/2023 CLINICAL DATA:  Shortness of breath, sepsis after prostate biopsy ester day EXAM: CT CHEST, ABDOMEN, AND PELVIS WITH CONTRAST TECHNIQUE: Multidetector CT imaging of the chest, abdomen and pelvis was performed following the standard protocol during bolus administration of intravenous contrast. RADIATION DOSE REDUCTION: This exam was performed according to the departmental dose-optimization program which includes automated exposure control, adjustment of the mA and/or kV according to patient size and/or use of iterative reconstruction technique. CONTRAST:  OMNIPAQUE IOHEXOL 300 MG/ML  SOLN COMPARISON:  Chest x-ray 03/30/2023, CT 01/14/2023, 03/31/2021, CT cervical spine 04/07/2022, CT abdomen pelvis 03/10/2020  FINDINGS: CT CHEST FINDINGS Cardiovascular: Nonaneurysmal aorta. Mild atherosclerosis. Normal cardiac size. No pericardial effusion Mediastinum/Nodes: Midline trachea. No thyroid mass. No suspicious mediastinal lymph nodes. Esophagus within normal limits. Lungs/Pleura: Lungs are clear. No pleural effusion or pneumothorax. Musculoskeletal: Sternum appears intact. No acute osseous abnormality. Heterogenous appearance of C7, T1 and T2 on reconstructed images, sagittal series 6, image 64 but not really confirmed on corresponding axial views. CT ABDOMEN PELVIS FINDINGS Hepatobiliary: No focal liver abnormality is seen. No gallstones, gallbladder wall thickening, or biliary dilatation. Hepatic steatosis. Pancreas: Unremarkable. No pancreatic ductal dilatation or surrounding inflammatory changes. Spleen: Normal in size without focal abnormality. Adrenals/Urinary Tract: Right adrenal gland is normal. Stable left adrenal nodule consistent with adenoma, no imaging follow-up is recommended. Kidneys are normal, without renal calculi, focal lesion, or hydronephrosis. Bladder is thick walled. Stomach/Bowel: Stomach is within normal limits. Appendix appears normal. No evidence of bowel wall thickening, distention, or inflammatory changes. Vascular/Lymphatic: Mild aortic atherosclerosis. No aneurysm. No suspicious lymph nodes. Reproductive: Obscured by artifact. Peri prostatic haziness suspected on coronal series 5, image 69. Prostate is enlarged. Other: Negative for pelvic effusion or free air. Small fat containing umbilical hernia. Musculoskeletal: No acute osseous abnormality. Right hip replacement. IMPRESSION: 1. Negative for acute airspace disease. 2. Enlarged prostate. Suspect haziness at the level of prostate but slightly limited due to streak artifact from right hip hardware. Question post biopsy change versus prostatitis. 3. Thick-walled bladder, cystitis versus chronic obstruction 4. Heterogenous appearance of C7, T1 and  T2 on reconstructed images, not confirmed on corresponding axial views, this could be artifactual but recommend dedicated cervical CT for further assessment. 5. Aortic atherosclerosis. Aortic Atherosclerosis (ICD10-I70.0). Electronically Signed   By: Jasmine Pang M.D.   On: 03/30/2023 22:38   DG Chest Port 1 View  Result Date: 03/30/2023 CLINICAL DATA:  Status post prostate surgery 2 days ago, presenting with fever and weakness. EXAM: PORTABLE CHEST 1 VIEW COMPARISON:  September 18, 2022 FINDINGS: The heart size and mediastinal contours are within normal limits. There is no evidence of an acute infiltrate, pleural effusion or pneumothorax. A right shoulder replacement is seen. Multilevel degenerative changes are noted throughout the thoracic spine. IMPRESSION: No active cardiopulmonary disease. Electronically Signed   By: Aram Candela M.D.   On: 03/30/2023 21:48     Data Reviewed: Relevant notes from primary care and specialist visits, past discharge summaries as available in EHR, including Care Everywhere. Prior diagnostic testing as pertinent to current admission diagnoses Updated medications and problem lists for reconciliation ED course, including vitals, labs, imaging, treatment and response to treatment Triage notes, nursing and pharmacy notes and ED provider's notes Notable results as noted in HPI   Assessment and Plan: S/p prostate biopsy 03/29/23 Severe sepsis Hematuria Anion gap metabolic acidosis/lactic acidosis Sepsis criteria include fever, tachycardia, tachypnea, lactic acidosis, hypotension UA could not be interpreted due to presence of pigment Suspecting acute infection related to recent prostate biopsy Continue sepsis fluids Continue cefepime Urology aware and formally consulted  Paroxysmal SVT (supraventricular tachycardia) History of palpitations Elevated troponin 29--> 130, suspect demand ischemia Suspect elevated troponin related to demand ischemia from tachycardia  related to sepsis from paroxysmal SVT seen on EKG #1 Patient denies chest pain and no acute ST-T wave changes seen on EKG Has had prior cardiac evaluation for palpitations .  Will get echo to evaluate for wall motion abnormality Trend troponin to peak Will get a cardiology consult  Hypotension Essential hypertension Soft blood pressures secondary to sepsis, improved with bolus Will hold home propranolol and amlodipine and resume as appropriate  High anion gap metabolic acidosis Secondary to lactic acidosis Expecting improvement with fluid resuscitation  DM (diabetes mellitus) (HCC) Sliding scale insulin coverage  History of recurrent meningioma s/p resection x2 No acute issues suspected  Seizures (HCC) Continue Vimpat and Lamictal Ativan as needed seizures    DVT prophylaxis: SCD due to hematuria  Consults: Urology, Dr. Gabrielle Dare  Advance Care Planning:   Code Status: Prior   Family Communication: none  Disposition Plan: Back to previous home environment  Severity of Illness: The appropriate patient status for this patient is INPATIENT. Inpatient status is judged to be reasonable and necessary in order to provide the required intensity of service to ensure the patient's safety. The patient's presenting symptoms, physical exam findings, and initial radiographic and laboratory data in the context of their chronic comorbidities is felt to place them at high risk for further clinical deterioration. Furthermore, it is not anticipated that the patient will be medically stable for discharge from the hospital within 2 midnights of admission.   * I certify that at the point of admission it is my clinical judgment that the patient will require inpatient hospital care spanning beyond 2 midnights from the point of admission due to high intensity of service, high risk for further deterioration and high frequency of surveillance required.*  Author: Andris Baumann, MD 03/30/2023 11:46  PM  For on call review [email or web address].

## 2023-03-30 NOTE — ED Triage Notes (Signed)
EMS brings pt in from home; prostate biopsy on Tues, hematuria since procedure; now with fever

## 2023-03-31 ENCOUNTER — Encounter: Payer: Self-pay | Admitting: Internal Medicine

## 2023-03-31 DIAGNOSIS — Z9889 Other specified postprocedural states: Secondary | ICD-10-CM | POA: Diagnosis not present

## 2023-03-31 DIAGNOSIS — A419 Sepsis, unspecified organism: Secondary | ICD-10-CM | POA: Diagnosis not present

## 2023-03-31 DIAGNOSIS — C61 Malignant neoplasm of prostate: Secondary | ICD-10-CM | POA: Diagnosis not present

## 2023-03-31 DIAGNOSIS — Z86018 Personal history of other benign neoplasm: Secondary | ICD-10-CM

## 2023-03-31 DIAGNOSIS — N9989 Other postprocedural complications and disorders of genitourinary system: Secondary | ICD-10-CM

## 2023-03-31 DIAGNOSIS — E8729 Other acidosis: Secondary | ICD-10-CM

## 2023-03-31 DIAGNOSIS — I471 Supraventricular tachycardia, unspecified: Secondary | ICD-10-CM | POA: Diagnosis not present

## 2023-03-31 DIAGNOSIS — R778 Other specified abnormalities of plasma proteins: Secondary | ICD-10-CM | POA: Diagnosis not present

## 2023-03-31 DIAGNOSIS — T8140XA Infection following a procedure, unspecified, initial encounter: Secondary | ICD-10-CM | POA: Diagnosis not present

## 2023-03-31 DIAGNOSIS — I2489 Other forms of acute ischemic heart disease: Secondary | ICD-10-CM | POA: Diagnosis not present

## 2023-03-31 DIAGNOSIS — R569 Unspecified convulsions: Secondary | ICD-10-CM

## 2023-03-31 DIAGNOSIS — E119 Type 2 diabetes mellitus without complications: Secondary | ICD-10-CM

## 2023-03-31 DIAGNOSIS — R7989 Other specified abnormal findings of blood chemistry: Secondary | ICD-10-CM | POA: Diagnosis not present

## 2023-03-31 DIAGNOSIS — R9431 Abnormal electrocardiogram [ECG] [EKG]: Secondary | ICD-10-CM

## 2023-03-31 DIAGNOSIS — E1165 Type 2 diabetes mellitus with hyperglycemia: Secondary | ICD-10-CM

## 2023-03-31 LAB — CBC
HCT: 36.3 % — ABNORMAL LOW (ref 39.0–52.0)
Hemoglobin: 12.4 g/dL — ABNORMAL LOW (ref 13.0–17.0)
MCH: 31.9 pg (ref 26.0–34.0)
MCHC: 34.2 g/dL (ref 30.0–36.0)
MCV: 93.3 fL (ref 80.0–100.0)
Platelets: 162 10*3/uL (ref 150–400)
RBC: 3.89 MIL/uL — ABNORMAL LOW (ref 4.22–5.81)
RDW: 12.6 % (ref 11.5–15.5)
WBC: 18.6 10*3/uL — ABNORMAL HIGH (ref 4.0–10.5)
nRBC: 0 % (ref 0.0–0.2)

## 2023-03-31 LAB — BLOOD CULTURE ID PANEL (REFLEXED) - BCID2

## 2023-03-31 LAB — GLUCOSE, CAPILLARY: Glucose-Capillary: 98 mg/dL (ref 70–99)

## 2023-03-31 LAB — CBG MONITORING, ED
Glucose-Capillary: 129 mg/dL — ABNORMAL HIGH (ref 70–99)
Glucose-Capillary: 81 mg/dL (ref 70–99)
Glucose-Capillary: 136 mg/dL — ABNORMAL HIGH (ref 70–99)
Glucose-Capillary: 108 mg/dL — ABNORMAL HIGH (ref 70–99)

## 2023-03-31 LAB — BASIC METABOLIC PANEL
Anion gap: 7 (ref 5–15)
BUN: 15 mg/dL (ref 8–23)
CO2: 23 mmol/L (ref 22–32)
Calcium: 7.7 mg/dL — ABNORMAL LOW (ref 8.9–10.3)
Chloride: 109 mmol/L (ref 98–111)
Creatinine, Ser: 1.08 mg/dL (ref 0.61–1.24)
GFR, Estimated: >60 mL/min (ref >60)
Glucose, Bld: 106 mg/dL — ABNORMAL HIGH (ref 70–99)
Potassium: 3.6 mmol/L (ref 3.5–5.1)
Sodium: 139 mmol/L (ref 135–145)

## 2023-03-31 LAB — MAGNESIUM: Magnesium: 1.3 mg/dL — ABNORMAL LOW (ref 1.7–2.4)

## 2023-03-31 LAB — TROPONIN I (HIGH SENSITIVITY)
Troponin I (High Sensitivity): 113 ng/L (ref <18)
Troponin I (High Sensitivity): 77 ng/L — ABNORMAL HIGH (ref <18)

## 2023-03-31 LAB — PROCALCITONIN: Procalcitonin: 128.55 ng/mL

## 2023-03-31 LAB — TSH: TSH: 0.944 u[IU]/mL (ref 0.350–4.500)

## 2023-03-31 LAB — HIV ANTIBODY (ROUTINE TESTING W REFLEX): HIV Screen 4th Generation wRfx: NONREACTIVE

## 2023-03-31 LAB — SARS CORONAVIRUS 2 BY RT PCR: SARS Coronavirus 2 by RT PCR: NEGATIVE

## 2023-03-31 MED ORDER — INSULIN ASPART 100 UNIT/ML IJ SOLN
0.0000 [IU] | Freq: Three times a day (TID) | INTRAMUSCULAR | Status: DC
  Administered 2023-03-31 – 2023-04-02 (×4): 2 [IU] via SUBCUTANEOUS
  Filled 2023-03-31 (×4): qty 1

## 2023-03-31 MED ORDER — INSULIN ASPART 100 UNIT/ML IJ SOLN: 0.0000 [IU] | Freq: Every day | INTRAMUSCULAR | Status: DC

## 2023-03-31 MED ORDER — ONDANSETRON HCL 4 MG/2ML IJ SOLN: 4.0000 mg | Freq: Four times a day (QID) | INTRAMUSCULAR | Status: DC | PRN

## 2023-03-31 MED ORDER — LACTATED RINGERS IV BOLUS
500.0000 mL | Freq: Once | INTRAVENOUS | Status: AC
  Administered 2023-03-31: 500 mL via INTRAVENOUS

## 2023-03-31 MED ORDER — HYDROCODONE-ACETAMINOPHEN 5-325 MG PO TABS
1.0000 | ORAL_TABLET | ORAL | Status: DC | PRN
  Administered 2023-03-31: 2 via ORAL
  Filled 2023-03-31: qty 2

## 2023-03-31 MED ORDER — LACOSAMIDE 50 MG PO TABS
100.0000 mg | ORAL_TABLET | Freq: Two times a day (BID) | ORAL | Status: DC
  Administered 2023-03-31 – 2023-04-02 (×6): 100 mg via ORAL
  Filled 2023-03-31 (×6): qty 2

## 2023-03-31 MED ORDER — LAMOTRIGINE 100 MG PO TABS
200.0000 mg | ORAL_TABLET | Freq: Two times a day (BID) | ORAL | Status: DC
  Administered 2023-03-31 – 2023-04-02 (×6): 200 mg via ORAL
  Filled 2023-03-31 (×6): qty 2

## 2023-03-31 MED ORDER — ACETAMINOPHEN 650 MG RE SUPP: 650.0000 mg | Freq: Four times a day (QID) | RECTAL | Status: DC | PRN

## 2023-03-31 MED ORDER — ATORVASTATIN CALCIUM 20 MG PO TABS
20.0000 mg | ORAL_TABLET | Freq: Every day | ORAL | Status: DC
  Administered 2023-03-31 – 2023-04-02 (×3): 20 mg via ORAL
  Filled 2023-03-31 (×3): qty 1

## 2023-03-31 MED ORDER — ONDANSETRON HCL 4 MG PO TABS: 4.0000 mg | ORAL_TABLET | Freq: Four times a day (QID) | ORAL | Status: DC | PRN

## 2023-03-31 MED ORDER — TAMSULOSIN HCL 0.4 MG PO CAPS
0.4000 mg | ORAL_CAPSULE | Freq: Every day | ORAL | Status: DC
  Administered 2023-03-31 – 2023-04-02 (×3): 0.4 mg via ORAL
  Filled 2023-03-31 (×3): qty 1

## 2023-03-31 MED ORDER — CLONAZEPAM 0.5 MG PO TABS
0.5000 mg | ORAL_TABLET | Freq: Every day | ORAL | Status: DC
  Administered 2023-03-31 – 2023-04-01 (×3): 0.5 mg via ORAL
  Filled 2023-03-31 (×3): qty 1

## 2023-03-31 MED ORDER — ACETAMINOPHEN 325 MG PO TABS
650.0000 mg | ORAL_TABLET | Freq: Four times a day (QID) | ORAL | Status: DC | PRN
  Administered 2023-03-31 – 2023-04-01 (×2): 650 mg via ORAL
  Filled 2023-03-31 (×2): qty 2

## 2023-03-31 MED ORDER — LACTATED RINGERS IV SOLN
150.0000 mL/h | INTRAVENOUS | Status: AC
  Administered 2023-03-31 (×2): 150 mL/h via INTRAVENOUS

## 2023-03-31 MED ORDER — ALBUTEROL SULFATE (2.5 MG/3ML) 0.083% IN NEBU: 2.5000 mg | INHALATION_SOLUTION | Freq: Four times a day (QID) | RESPIRATORY_TRACT | Status: DC | PRN

## 2023-03-31 MED ORDER — SODIUM CHLORIDE 0.9 % IV SOLN
2.0000 g | Freq: Three times a day (TID) | INTRAVENOUS | Status: DC
  Administered 2023-03-31: 2 g via INTRAVENOUS
  Filled 2023-03-31: qty 12.5

## 2023-03-31 MED ORDER — SODIUM CHLORIDE 0.9 % IV SOLN
2.0000 g | INTRAVENOUS | Status: DC
  Administered 2023-03-31 – 2023-04-02 (×3): 2 g via INTRAVENOUS
  Filled 2023-03-31 (×3): qty 20

## 2023-03-31 MED ORDER — POTASSIUM CHLORIDE CRYS ER 20 MEQ PO TBCR
40.0000 meq | EXTENDED_RELEASE_TABLET | Freq: Once | ORAL | Status: AC
  Administered 2023-03-31: 40 meq via ORAL
  Filled 2023-03-31: qty 2

## 2023-03-31 MED ORDER — LORAZEPAM 2 MG/ML IJ SOLN: 2.0000 mg | INTRAMUSCULAR | Status: DC | PRN

## 2023-03-31 MED ORDER — METHOCARBAMOL 500 MG PO TABS
750.0000 mg | ORAL_TABLET | Freq: Every day | ORAL | Status: DC | PRN
  Administered 2023-03-31 – 2023-04-01 (×2): 750 mg via ORAL
  Filled 2023-03-31 (×2): qty 2

## 2023-03-31 NOTE — Progress Notes (Addendum)
Progress Note   Patient: Joshua [name] WUJ:[id number] DOB: 20-Jan-1961 DOA: 03/30/2023     1 DOS: the patient was seen and examined on 03/31/2023   Brief hospital course: DAKING HEMSLEY is a 62 y.o. male with medical history significant for Malignant meningioma s/p resection 2014 and 2020, seizures, HTN, anxiety, palpitations, diabetes type 2, BPH s/p prostate biopsy on 9/11 presented for worsening pain since the procedure, hematuria and fever.  In the ED patient had fever of 103.1, tachycardic with pulse rate 157, BP 87/62, lactic acid 6.5, troponin bump.  Urinalysis not reported due to urine pigment.  CT abdomen pelvis showed enlarged prostate findings suspicious for prostatitis postbiopsy.  Patient is admitted to the hospitalist service for severe sepsis due to prostatitis.  Assessment and Plan: Severe sepsis due to prostatitis S/p prostate biopsy 03/29/23 Hematuria Gram negative bacteremia Sepsis criteria include fever, tachycardia, tachypnea, lactic acidosis, hypotension UA could not be interpreted due to presence of pigment Suspecting acute infection related to recent prostate biopsy Continue sepsis fluids Cefepime changed to Rocephin per pharmacy protocol, follow final culture/ sensitivities. Urology consult appreciated-bladder scan, antibiotic therapy for 14 days.  Paroxysmal SVT (supraventricular tachycardia) History of palpitations Elevated troponin 29--> 130 Demand ischemia Had prior episodes of palpitations. Elevated troponin likely demand ischemia. Echocardiogram pending. Troponin downtrending 77 today morning. Cardiology evaluation appreciated, sinus tachycardia versus SVT due to severe sepsis.  Further workup pending echocardiogram.  Essential hypertension Hold antihypertensive medications as his blood pressure is lower side. Propranolol and amlodipine will be resumed if blood pressure persistently high.  High anion gap metabolic acidosis Secondary to lactic  acidosis Improved with fluid resuscitation.  DM (diabetes mellitus) (HCC) Sliding scale insulin coverage. A1c 6.3 three months ago.  History of recurrent meningioma s/p resection x2 Stable.   Seizures (HCC) Continue Vimpat and Lamictal Ativan as needed seizures.  DVT prophylaxis- SCD due to hematuria.   Code Status: Full Code  Subjective: Patient is seen and examined today morning.  He is feeling better now.  Remains afebrile.  Does complain of pain with urination.  Bladder scan 95 mL postvoid.  Physical Exam: Vitals:   03/31/23 1200 03/31/23 1230 03/31/23 1300 03/31/23 1329  BP: 98/62 93/62 92/72    Pulse: 74 71 79   Resp:      Temp:    99 F (37.2 C)  TempSrc:    Oral  SpO2: 99% 100% 100%   Weight:      Height:       General -middle-aged African-American male, no apparent distress HEENT - PERRLA, EOMI, atraumatic head, non tender sinuses. Lung - Clear, no rales, rhonchi or wheezes. Heart - S1, S2 heard, no murmurs, rubs, trace pedal edema. Abdomen - soft, lower abdomen tender, no guarding, rigidity. Neuro - Alert, awake and oriented x 3, non focal exam. Skin - Warm and dry. Data Reviewed:     Latest Ref Rng & Units 03/31/2023    5:01 AM 03/30/2023    8:20 PM 01/17/2023   10:58 PM  CBC  WBC 4.0 - 10.5 K/uL 18.6  5.4  11.5   Hemoglobin 13.0 - 17.0 g/dL 95.6  21.3  08.6   Hematocrit 39.0 - 52.0 % 36.3  45.9  44.6   Platelets 150 - 400 K/uL 162  201  254       Latest Ref Rng & Units 03/31/2023    5:01 AM 03/30/2023    8:20 PM 01/17/2023   10:58 PM  BMP  Glucose 70 -  99 mg/dL 161  096  045   BUN 8 - 23 mg/dL 15  20  25    Creatinine 0.61 - 1.24 mg/dL 4.09  8.11  9.14   Sodium 135 - 145 mmol/L 139  137  138   Potassium 3.5 - 5.1 mmol/L 3.6  3.5  4.1   Chloride 98 - 111 mmol/L 109  103  106   CO2 22 - 32 mmol/L 23  17  23    Calcium 8.9 - 10.3 mg/dL 7.7  9.0  9.2    CT CHEST ABDOMEN PELVIS W CONTRAST  Result Date: 03/30/2023 CLINICAL DATA:  Shortness of breath,  sepsis after prostate biopsy ester day EXAM: CT CHEST, ABDOMEN, AND PELVIS WITH CONTRAST TECHNIQUE: Multidetector CT imaging of the chest, abdomen and pelvis was performed following the standard protocol during bolus administration of intravenous contrast. RADIATION DOSE REDUCTION: This exam was performed according to the departmental dose-optimization program which includes automated exposure control, adjustment of the mA and/or kV according to patient size and/or use of iterative reconstruction technique. CONTRAST:  OMNIPAQUE IOHEXOL 300 MG/ML  SOLN COMPARISON:  Chest x-ray 03/30/2023, CT 01/14/2023, 03/31/2021, CT cervical spine 04/07/2022, CT abdomen pelvis 03/10/2020 FINDINGS: CT CHEST FINDINGS Cardiovascular: Nonaneurysmal aorta. Mild atherosclerosis. Normal cardiac size. No pericardial effusion Mediastinum/Nodes: Midline trachea. No thyroid mass. No suspicious mediastinal lymph nodes. Esophagus within normal limits. Lungs/Pleura: Lungs are clear. No pleural effusion or pneumothorax. Musculoskeletal: Sternum appears intact. No acute osseous abnormality. Heterogenous appearance of C7, T1 and T2 on reconstructed images, sagittal series 6, image 64 but not really confirmed on corresponding axial views. CT ABDOMEN PELVIS FINDINGS Hepatobiliary: No focal liver abnormality is seen. No gallstones, gallbladder wall thickening, or biliary dilatation. Hepatic steatosis. Pancreas: Unremarkable. No pancreatic ductal dilatation or surrounding inflammatory changes. Spleen: Normal in size without focal abnormality. Adrenals/Urinary Tract: Right adrenal gland is normal. Stable left adrenal nodule consistent with adenoma, no imaging follow-up is recommended. Kidneys are normal, without renal calculi, focal lesion, or hydronephrosis. Bladder is thick walled. Stomach/Bowel: Stomach is within normal limits. Appendix appears normal. No evidence of bowel wall thickening, distention, or inflammatory changes.  Vascular/Lymphatic: Mild aortic atherosclerosis. No aneurysm. No suspicious lymph nodes. Reproductive: Obscured by artifact. Peri prostatic haziness suspected on coronal series 5, image 69. Prostate is enlarged. Other: Negative for pelvic effusion or free air. Small fat containing umbilical hernia. Musculoskeletal: No acute osseous abnormality. Right hip replacement. IMPRESSION: 1. Negative for acute airspace disease. 2. Enlarged prostate. Suspect haziness at the level of prostate but slightly limited due to streak artifact from right hip hardware. Question post biopsy change versus prostatitis. 3. Thick-walled bladder, cystitis versus chronic obstruction 4. Heterogenous appearance of C7, T1 and T2 on reconstructed images, not confirmed on corresponding axial views, this could be artifactual but recommend dedicated cervical CT for further assessment. 5. Aortic atherosclerosis. Aortic Atherosclerosis (ICD10-I70.0). Electronically Signed   By: Jasmine Pang M.D.   On: 03/30/2023 22:38   DG Chest Port 1 View  Result Date: 03/30/2023 CLINICAL DATA:  Status post prostate surgery 2 days ago, presenting with fever and weakness. EXAM: PORTABLE CHEST 1 VIEW COMPARISON:  September 18, 2022 FINDINGS: The heart size and mediastinal contours are within normal limits. There is no evidence of an acute infiltrate, pleural effusion or pneumothorax. A right shoulder replacement is seen. Multilevel degenerative changes are noted throughout the thoracic spine. IMPRESSION: No active cardiopulmonary disease. Electronically Signed   By: Aram Candela M.D.   On: 03/30/2023 21:48  Family Communication: Patient understands and agrees with above plan.   Disposition: Status is: Inpatient Remains inpatient appropriate because: Sepsis work up Planned Discharge Destination: Home  MDM level 3-patient presented with severe sepsis, possibly prostatitis, gram-negative bacteremia.  Patient will need IV antibiotic therapy and close  hemodynamic, neurologic and telemetry monitoring.  He is at high risk for clinical deterioration.  Author: Marcelino Duster, MD 03/31/2023 2:26 PM  For on call review [email or web address].

## 2023-03-31 NOTE — Progress Notes (Signed)
Pharmacy Antibiotic Note  Joshua [name] is a 62 y.o. male admitted on 03/30/2023 with sepsis.  Pharmacy has been consulted for Cefepime dosing.  Plan: Cefepime 2 gm IV X 1 given in ED on 9/12 @ 2042. Cefepime 2 gm IV Q8H ordered to start on 9/13 @ 0500.   Height: 5\' 9"  (175.3 cm) Weight: 77.1 kg (170 lb) IBW/kg (Calculated) : 70.7  Temp (24hrs), Avg:102 F (38.9 C), Min:100.1 F (37.8 C), Max:103.1 F (39.5 C)  Recent Labs  Lab 03/30/23 2020 03/30/23 2252  WBC 5.4  --   CREATININE 1.00  --   LATICACIDVEN 6.5* 3.3*    Estimated Creatinine Clearance: 76.6 mL/min (by C-G formula based on SCr of 1 mg/dL).    No Known Allergies  Antimicrobials this admission:   >>    >>   Dose adjustments this admission:   Microbiology results:  BCx:   UCx:    Sputum:   MRSA PCR:  Thank you for allowing pharmacy to be a part of this patient's care.  Khylie Larmore D 03/31/2023 1:12 AM

## 2023-03-31 NOTE — Assessment & Plan Note (Signed)
Sliding scale insulin coverage

## 2023-03-31 NOTE — Assessment & Plan Note (Addendum)
Continue Vimpat and Lamictal Ativan as needed seizures

## 2023-03-31 NOTE — Consult Note (Signed)
Urology Consult  I have been asked to see the patient by Dr. Para March, for evaluation and management of sepsis following prostate biopsy.  Chief Complaint: Fever, hematuria  History of Present Illness: Joshua [name] is a 62 y.o. year old male with low risk prostate cancer on active surveillance who underwent fusion biopsy with Dr. Richardo Hanks 2 days ago who presented to the ED overnight with reports of fever and weakness.  He also had gross hematuria following the procedure.  He was febrile on arrival, 103.1 F.  He has been hypotensive this morning and his initial tachycardia has resolved.  Admission labs notable for creatinine 1.00 (baseline 0.9); lactate 6.5; white count 5.4; and urine microscopy with 50 RBC/hpf, 6-10 WBC/hpf, and no bacteria.  Blood cultures are preliminarily growing E. coli.  On antibiotics as below.  He is spontaneously voiding yellow urine this morning and states he feels he is emptying well.  He says overall he is feeling better since his arrival.  Anti-infectives (From admission, onward)    Start     Dose/Rate Route Frequency Ordered Stop   03/31/23 0500  ceFEPIme (MAXIPIME) 2 g in sodium chloride 0.9 % 100 mL IVPB        2 g 200 mL/hr over 30 Minutes Intravenous Every 8 hours 03/31/23 0110     03/30/23 2045  vancomycin (VANCOCIN) IVPB 1000 mg/200 mL premix       Placed in "Followed by" Linked Group   1,000 mg 200 mL/hr over 60 Minutes Intravenous  Once 03/30/23 2043 03/30/23 2252   03/30/23 2045  vancomycin (VANCOREADY) IVPB 750 mg/150 mL       Placed in "Followed by" Linked Group   750 mg 150 mL/hr over 60 Minutes Intravenous  Once 03/30/23 2043 03/30/23 2252   03/30/23 2030  ceFEPIme (MAXIPIME) 2 g in sodium chloride 0.9 % 100 mL IVPB        2 g 200 mL/hr over 30 Minutes Intravenous  Once 03/30/23 2019 03/30/23 2143        Past Medical History:  Diagnosis Date   Anxiety    Brain tumor (HCC) 02/20/2013   brain tumor removed in March 2014, Dr  Rush Farmer   Diffuse idiopathic skeletal hyperostosis 06/05/2013   Dizziness    Enlarged prostate    GERD (gastroesophageal reflux disease)    Headache(784.0)    scattered   High cholesterol    Hypertension    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   Meningioma (HCC)    Meningioma, recurrent of brain (HCC) 01/31/2019   Neuropathy 12/05/2019   Pre-diabetes    Rotator cuff tear    right   Seizures (HCC)    11/27/19 last sz 1 wk ago    Past Surgical History:  Procedure Laterality Date   APPLICATION OF CRANIAL NAVIGATION N/A 01/10/2019   Procedure: APPLICATION OF CRANIAL NAVIGATION;  Surgeon: Maeola Harman, MD;  Location: [facility] OR;  Service: Neurosurgery;  Laterality: N/A;   CATARACT EXTRACTION Right    CRANIOTOMY Right 11/06/2012   Procedure: CRANIOTOMY TUMOR EXCISION;  Surgeon: Maeola Harman, MD;  Location: [facility];  Service: Neurosurgery;  Laterality: Right;  Right Parasagittal craniotomy for meningioma with Stealth   CRANIOTOMY Right 01/10/2019   Procedure: Right Parasagittal Craniotomy for Tumor;  Surgeon: Maeola Harman, MD;  Location: [facility] OR;  Service: Neurosurgery;  Laterality: Right;  Right parasagittal craniotomy for tumor   ESOPHAGOGASTRODUODENOSCOPY     JOINT REPLACEMENT Right 2018   right hip  REVERSE SHOULDER ARTHROPLASTY Right 01/08/2021   Procedure: RIGHT REVERSE SHOULDER ARTHROPLASTY;  Surgeon: Cammy Copa, MD;  Location: [facility] OR;  Service: Orthopedics;  Laterality: Right;   right knee arthroscopy     SHOULDER ARTHROSCOPY Right 06/09/2020   Procedure: RIGHT SHOULDER ARTHROSCOPY AND DEBRIDEMENT;  Surgeon: Nadara Mustard, MD;  Location:  SURGERY CENTER;  Service: Orthopedics;  Laterality: Right;    Home Medications:  Current Meds  Medication Sig   amLODipine (NORVASC) 5 MG tablet TAKE 1 TABLET (5 MG TOTAL) BY MOUTH DAILY. TO LOWER BLOOD PRESSURE. STOP LOSARTAN   atorvastatin (LIPITOR) 20 MG tablet TAKE 1 TABLET (20 MG TOTAL) BY MOUTH  DAILY.   clonazePAM (KLONOPIN) 0.5 MG tablet TAKE 1 TABLET (0.5 MG TOTAL) BY MOUTH AT BEDTIME.   Lacosamide 100 MG TABS TAKE 1 TABLET (100 MG TOTAL) BY MOUTH IN THE MORNING AND AT BEDTIME.   lamoTRIgine (LAMICTAL) 200 MG tablet TAKE 1 TABLET (200 MG TOTAL) BY MOUTH 2 (TWO) TIMES DAILY.   metFORMIN (GLUCOPHAGE) 500 MG tablet Take 1 tablet (500 mg total) by mouth 2 (two) times daily.   omeprazole (PRILOSEC) 20 MG capsule Take 20 mg by mouth daily.   tamsulosin (FLOMAX) 0.4 MG CAPS capsule Take 1 capsule (0.4 mg total) by mouth daily.    Allergies: No Known Allergies  Family History  Problem Relation Age of Onset   Hypertension Mother    Dementia Mother    Diabetes Mother    Hypertension Father    CAD Father    Diabetes Father    CAD Brother     Social History:  reports that he quit smoking about 4 years ago. His smoking use included cigarettes. He started smoking about 19 years ago. He has a 0.2 pack-year smoking history. He has been exposed to tobacco smoke. He has never used smokeless tobacco. He reports current alcohol use. He reports that he does not use drugs.  ROS: A complete review of systems was performed.  All systems are negative except for pertinent findings as noted.  Physical Exam:  Vital signs in last 24 hours: Temp:  [100.1 F (37.8 C)-103.1 F (39.5 C)] 100.1 F (37.8 C) (09/12 2256) Pulse Rate:  [71-157] 71 (09/13 0800) Resp:  [19-42] 19 (09/13 0800) BP: (87-124)/(57-73) 91/60 (09/13 0800) SpO2:  [92 %-99 %] 98 % (09/13 0800) Weight:  [77.1 kg] 77.1 kg (09/12 1955) Constitutional:  Alert and oriented, no acute distress HEENT: [place] AT, moist mucus membranes Cardiovascular: No clubbing, cyanosis, or edema Respiratory: Normal respiratory effort Skin: No rashes, bruises or suspicious lesions Neurologic: Grossly intact, no focal deficits, moving all 4 extremities Psychiatric: Normal mood and affect  Laboratory Data:  Recent Labs    03/30/23 2020 03/31/23 0501   WBC 5.4 18.6*  HGB 15.7 12.4*  HCT 45.9 36.3*   Recent Labs    03/30/23 2020 03/31/23 0501  NA 137 139  K 3.5 3.6  CL 103 109  CO2 17* 23  GLUCOSE 128* 106*  BUN 20 15  CREATININE 1.00 1.08  CALCIUM 9.0 7.7*   Recent Labs    03/30/23 2020  INR 1.1   Urinalysis    Component Value Date/Time   COLORURINE RED (A) 03/30/2023 2024   APPEARANCEUR TURBID (A) 03/30/2023 2024   APPEARANCEUR Clear 05/20/2021 1005   LABSPEC  03/30/2023 2024    TEST NOT REPORTED DUE TO COLOR INTERFERENCE OF URINE PIGMENT   PHURINE  03/30/2023 2024    TEST NOT REPORTED  DUE TO COLOR INTERFERENCE OF URINE PIGMENT   GLUCOSEU (A) 03/30/2023 2024    TEST NOT REPORTED DUE TO COLOR INTERFERENCE OF URINE PIGMENT   HGBUR (A) 03/30/2023 2024    TEST NOT REPORTED DUE TO COLOR INTERFERENCE OF URINE PIGMENT   BILIRUBINUR (A) 03/30/2023 2024    TEST NOT REPORTED DUE TO COLOR INTERFERENCE OF URINE PIGMENT   BILIRUBINUR Negative 05/20/2021 1005   KETONESUR (A) 03/30/2023 2024    TEST NOT REPORTED DUE TO COLOR INTERFERENCE OF URINE PIGMENT   PROTEINUR (A) 03/30/2023 2024    TEST NOT REPORTED DUE TO COLOR INTERFERENCE OF URINE PIGMENT   UROBILINOGEN 0.2 07/01/2020 1502   UROBILINOGEN 0.2 09/08/2019 1541   NITRITE (A) 03/30/2023 2024    TEST NOT REPORTED DUE TO COLOR INTERFERENCE OF URINE PIGMENT   LEUKOCYTESUR (A) 03/30/2023 2024    TEST NOT REPORTED DUE TO COLOR INTERFERENCE OF URINE PIGMENT   Results for orders placed or performed during the hospital encounter of 03/30/23  Blood Culture (routine x 2)     Status: None (Preliminary result)   Collection Time: 03/30/23  8:18 PM   Specimen: BLOOD  Result Value Ref Range Status   Specimen Description BLOOD  Final   Special Requests   Final    BOTTLES DRAWN AEROBIC AND ANAEROBIC Blood Culture adequate volume   Culture  Setup Time   Final    Organism ID to follow GRAM NEGATIVE RODS IN BOTH AEROBIC AND ANAEROBIC BOTTLES CRITICAL RESULT CALLED TO, READ BACK  BY AND VERIFIED WITH: MADISON HUNT AT 0906 03/31/23.PMF Performed at [facility], [address] [place], [place] [address]    Culture GRAM NEGATIVE RODS  Final   Report Status PENDING  Incomplete  Blood Culture ID Panel (Reflexed)     Status: Abnormal   Collection Time: 03/30/23  8:18 PM  Result Value Ref Range Status   Enterococcus faecalis NOT DETECTED NOT DETECTED Final   Enterococcus Faecium NOT DETECTED NOT DETECTED Final   Listeria monocytogenes NOT DETECTED NOT DETECTED Final   Staphylococcus species NOT DETECTED NOT DETECTED Final   Staphylococcus aureus (BCID) NOT DETECTED NOT DETECTED Final   Staphylococcus epidermidis NOT DETECTED NOT DETECTED Final   Staphylococcus lugdunensis NOT DETECTED NOT DETECTED Final   Streptococcus species NOT DETECTED NOT DETECTED Final   Streptococcus agalactiae NOT DETECTED NOT DETECTED Final   Streptococcus pneumoniae NOT DETECTED NOT DETECTED Final   Streptococcus pyogenes NOT DETECTED NOT DETECTED Final   A.calcoaceticus-baumannii NOT DETECTED NOT DETECTED Final   Bacteroides fragilis NOT DETECTED NOT DETECTED Final   Enterobacterales DETECTED (A) NOT DETECTED Final    Comment: Enterobacterales represent a large order of gram negative bacteria, not a single organism. CRITICAL RESULT CALLED TO, READ BACK BY AND VERIFIED WITH: MADISON HUNT AT 0906 03/31/23.PMF    Enterobacter cloacae complex NOT DETECTED NOT DETECTED Final   Escherichia coli DETECTED (A) NOT DETECTED Final    Comment: CRITICAL RESULT CALLED TO, READ BACK BY AND VERIFIED WITH: MADISON HUNT AT 0906 03/31/23.PMF    Klebsiella aerogenes NOT DETECTED NOT DETECTED Final   Klebsiella oxytoca NOT DETECTED NOT DETECTED Final   Klebsiella pneumoniae NOT DETECTED NOT DETECTED Final   Proteus species NOT DETECTED NOT DETECTED Final   Salmonella species NOT DETECTED NOT DETECTED Final   Serratia marcescens NOT DETECTED NOT DETECTED Final   Haemophilus influenzae NOT DETECTED  NOT DETECTED Final   Neisseria meningitidis NOT DETECTED NOT DETECTED Final   Pseudomonas aeruginosa NOT  DETECTED NOT DETECTED Final   Stenotrophomonas maltophilia NOT DETECTED NOT DETECTED Final   Candida albicans NOT DETECTED NOT DETECTED Final   Candida auris NOT DETECTED NOT DETECTED Final   Candida glabrata NOT DETECTED NOT DETECTED Final   Candida krusei NOT DETECTED NOT DETECTED Final   Candida parapsilosis NOT DETECTED NOT DETECTED Final   Candida tropicalis NOT DETECTED NOT DETECTED Final   Cryptococcus neoformans/gattii NOT DETECTED NOT DETECTED Final   CTX-M ESBL NOT DETECTED NOT DETECTED Final   Carbapenem resistance IMP NOT DETECTED NOT DETECTED Final   Carbapenem resistance KPC NOT DETECTED NOT DETECTED Final   Carbapenem resistance NDM NOT DETECTED NOT DETECTED Final   Carbapenem resist OXA 48 LIKE NOT DETECTED NOT DETECTED Final   Carbapenem resistance VIM NOT DETECTED NOT DETECTED Final    Comment: Performed at [facility], [address] [place], [place] [address]  Blood Culture (routine x 2)     Status: None (Preliminary result)   Collection Time: 03/30/23  8:22 PM   Specimen: BLOOD  Result Value Ref Range Status   Specimen Description BLOOD  Final   Special Requests   Final    BOTTLES DRAWN AEROBIC AND ANAEROBIC Blood Culture results may not be optimal due to an excessive volume of blood received in culture bottles   Culture  Setup Time   Final    GRAM NEGATIVE RODS IN BOTH AEROBIC AND ANAEROBIC BOTTLES CRITICAL RESULT CALLED TO, READ BACK BY AND VERIFIED WITH: MADISON HUNT AT 0906 03/31/23.PMF Performed at [facility], [address] [place], [place] [address]    Culture GRAM NEGATIVE RODS  Final   Report Status PENDING  Incomplete   *Note: Due to a large number of results and/or encounters for the requested time period, some results have not been displayed. A complete set of results can be found in Results Review.    Radiologic  Imaging: CT CHEST ABDOMEN PELVIS W CONTRAST  Result Date: 03/30/2023 CLINICAL DATA:  Shortness of breath, sepsis after prostate biopsy ester day EXAM: CT CHEST, ABDOMEN, AND PELVIS WITH CONTRAST TECHNIQUE: Multidetector CT imaging of the chest, abdomen and pelvis was performed following the standard protocol during bolus administration of intravenous contrast. RADIATION DOSE REDUCTION: This exam was performed according to the departmental dose-optimization program which includes automated exposure control, adjustment of the mA and/or kV according to patient size and/or use of iterative reconstruction technique. CONTRAST:  OMNIPAQUE IOHEXOL 300 MG/ML  SOLN COMPARISON:  Chest x-ray 03/30/2023, CT 01/14/2023, 03/31/2021, CT cervical spine 04/07/2022, CT abdomen pelvis 03/10/2020 FINDINGS: CT CHEST FINDINGS Cardiovascular: Nonaneurysmal aorta. Mild atherosclerosis. Normal cardiac size. No pericardial effusion Mediastinum/Nodes: Midline trachea. No thyroid mass. No suspicious mediastinal lymph nodes. Esophagus within normal limits. Lungs/Pleura: Lungs are clear. No pleural effusion or pneumothorax. Musculoskeletal: Sternum appears intact. No acute osseous abnormality. Heterogenous appearance of C7, T1 and T2 on reconstructed images, sagittal series 6, image 64 but not really confirmed on corresponding axial views. CT ABDOMEN PELVIS FINDINGS Hepatobiliary: No focal liver abnormality is seen. No gallstones, gallbladder wall thickening, or biliary dilatation. Hepatic steatosis. Pancreas: Unremarkable. No pancreatic ductal dilatation or surrounding inflammatory changes. Spleen: Normal in size without focal abnormality. Adrenals/Urinary Tract: Right adrenal gland is normal. Stable left adrenal nodule consistent with adenoma, no imaging follow-up is recommended. Kidneys are normal, without renal calculi, focal lesion, or hydronephrosis. Bladder is thick walled. Stomach/Bowel: Stomach is within normal limits. Appendix  appears normal. No evidence of bowel wall thickening, distention, or inflammatory  changes. Vascular/Lymphatic: Mild aortic atherosclerosis. No aneurysm. No suspicious lymph nodes. Reproductive: Obscured by artifact. Peri prostatic haziness suspected on coronal series 5, image 69. Prostate is enlarged. Other: Negative for pelvic effusion or free air. Small fat containing umbilical hernia. Musculoskeletal: No acute osseous abnormality. Right hip replacement. IMPRESSION: 1. Negative for acute airspace disease. 2. Enlarged prostate. Suspect haziness at the level of prostate but slightly limited due to streak artifact from right hip hardware. Question post biopsy change versus prostatitis. 3. Thick-walled bladder, cystitis versus chronic obstruction 4. Heterogenous appearance of C7, T1 and T2 on reconstructed images, not confirmed on corresponding axial views, this could be artifactual but recommend dedicated cervical CT for further assessment. 5. Aortic atherosclerosis. Aortic Atherosclerosis (ICD10-I70.0). Electronically Signed   By: Jasmine Pang M.D.   On: 03/30/2023 22:38   DG Chest Port 1 View  Result Date: 03/30/2023 CLINICAL DATA:  Status post prostate surgery 2 days ago, presenting with fever and weakness. EXAM: PORTABLE CHEST 1 VIEW COMPARISON:  September 18, 2022 FINDINGS: The heart size and mediastinal contours are within normal limits. There is no evidence of an acute infiltrate, pleural effusion or pneumothorax. A right shoulder replacement is seen. Multilevel degenerative changes are noted throughout the thoracic spine. IMPRESSION: No active cardiopulmonary disease. Electronically Signed   By: Aram Candela M.D.   On: 03/30/2023 21:48    Assessment & Plan:  62 year old male with low risk prostate cancer on active surveillance who underwent fusion biopsy 2 days ago now admitted with post prostate biopsy sepsis.  Blood cultures are growing E. coli.  He is spontaneously voiding and feels he is  emptying well, they would like to confirm with a bladder scan.  If significantly elevated, we discussed we may need to place a Foley catheter for maximum urinary decompression.  Agree with antibiotics and supportive care per primary team.  May consider extending to a carbapenem based antibiotic regimen.  Recommendations: -Bladder scan, order placed, please notify me of results -Antibiotics and supportive care per primary team, consider extending to a Carbapenem based antibiotic regimen.  Follow cultures for total of 14 days of culture appropriate therapy.  Thank you for involving me in this patient's care, I will continue to follow along.  Carman Ching, PA-C 03/31/2023 9:14 AM

## 2023-03-31 NOTE — Consult Note (Signed)
Cardiology Consultation:   Patient ID: Joshua [name]; [id number]; [birth year]   Admit date: 03/30/2023 Date of Consult: 03/31/2023  Primary Care Provider: Alease Medina, MD Primary Cardiologist: Mayford Knife Primary Electrophysiologist:  None   Patient Profile:   Joshua [name] is a 62 y.o. male with a hx of aortic atherosclerosis, HTN, malignant meningioma s/p resection in 2014 with recurrence in 2020 s/p resection with seizures, and palpitations who is admitted with sepsis secondary to E. coli bacteremia in the setting of suspected prostatitis and is being seen today for the evaluation of SVT and elevated troponin at the request of Dr. Para March.  History of Present Illness:   [name] has a history of intermittent palpitations with outpatient cardiac monitoring x 2 being unrevealing.  Calcium score in 2021 of 0.  Nuclear stress test in 2021 showed no evidence of ischemia and was low risk.  Echo in 11/2022 showed an EF of 65 to 70%, no regional wall motion abnormalities, normal RV systolic function and ventricular cavity size, trivial mitral regurgitation, and mild dilatation of the aortic root measuring 39 mm.  Palpitations have been well-controlled with as needed labetalol.  He recently underwent prostate biopsy on 03/29/2023.  Post procedure he developed pain with associated hematuria and fever with a Tmax of 103.1.  In the ED he was noted to be tachycardic with EKG showing SVT with a rate of 159 bpm.  He was found to be bacteremic with blood culture growing E. coli.  CT of the chest, abdomen, and pelvis notable for haziness at the level of the prostate concerning for biopsy change versus prostatitis with incidental findings as outlined below as well.  Troponin was mildly elevated peaking at 130 with subsequent downtrend.  Cardiology consulted for SVT and elevated troponin.  Patient was never with chest pain or symptoms of cardiac decompensation.  No associated palpitations while in  documented SVT.  Has been maintaining sinus rhythm overnight with rates in the 70s bpm.  BP soft in the 80s systolic.  Currently without cardiac complaint.    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   Meningioma (HCC)    Meningioma, recurrent of brain (HCC) 01/31/2019   Neuropathy 12/05/2019   Pre-diabetes    Rotator cuff tear    right   Seizures (HCC)    11/27/19 last sz 1 wk ago    Past Surgical History:  Procedure Laterality Date   APPLICATION OF CRANIAL NAVIGATION N/A 01/10/2019   Procedure: APPLICATION OF CRANIAL NAVIGATION;  Surgeon: Maeola Harman, MD;  Location: [facility] OR;  Service: Neurosurgery;  Laterality: N/A;   CATARACT EXTRACTION Right    CRANIOTOMY Right 11/06/2012   Procedure: CRANIOTOMY TUMOR EXCISION;  Surgeon: Maeola Harman, MD;  Location: [facility];  Service: Neurosurgery;  Laterality: Right;  Right Parasagittal craniotomy for meningioma with Stealth   CRANIOTOMY Right 01/10/2019   Procedure: Right Parasagittal Craniotomy for Tumor;  Surgeon: Maeola Harman, MD;  Location: [facility] OR;  Service: Neurosurgery;  Laterality: Right;  Right parasagittal craniotomy for tumor   ESOPHAGOGASTRODUODENOSCOPY     JOINT REPLACEMENT Right 2018  right hip   REVERSE SHOULDER ARTHROPLASTY Right 01/08/2021   Procedure: RIGHT REVERSE SHOULDER ARTHROPLASTY;  Surgeon: Cammy Copa, MD;  Location: [facility] OR;  Service: Orthopedics;  Laterality: Right;   right knee arthroscopy     SHOULDER ARTHROSCOPY Right 06/09/2020   Procedure: RIGHT SHOULDER ARTHROSCOPY AND DEBRIDEMENT;  Surgeon: Nadara Mustard, MD;  Location: [facility];   Service: Orthopedics;  Laterality: Right;     Home Meds: Prior to Admission medications   Medication Sig Start Date End Date Taking? Authorizing Provider  amLODipine (NORVASC) 5 MG tablet TAKE 1 TABLET (5 MG TOTAL) BY MOUTH DAILY. TO LOWER BLOOD PRESSURE. STOP LOSARTAN 08/13/21  Yes Newlin, Enobong, MD  atorvastatin (LIPITOR) 20 MG tablet TAKE 1 TABLET (20 MG TOTAL) BY MOUTH DAILY. 12/08/21  Yes Newlin, Odette Horns, MD  clonazePAM (KLONOPIN) 0.5 MG tablet TAKE 1 TABLET (0.5 MG TOTAL) BY MOUTH AT BEDTIME. 12/08/22  Yes Vaslow, Georgeanna Lea, MD  Lacosamide 100 MG TABS TAKE 1 TABLET (100 MG TOTAL) BY MOUTH IN THE MORNING AND AT BEDTIME. 12/08/22  Yes Vaslow, Georgeanna Lea, MD  lamoTRIgine (LAMICTAL) 200 MG tablet TAKE 1 TABLET (200 MG TOTAL) BY MOUTH 2 (TWO) TIMES DAILY. 02/16/23  Yes Vaslow, Georgeanna Lea, MD  metFORMIN (GLUCOPHAGE) 500 MG tablet Take 1 tablet (500 mg total) by mouth 2 (two) times daily. 08/13/21  Yes Hoy Register, MD  omeprazole (PRILOSEC) 20 MG capsule Take 20 mg by mouth daily. 08/29/22  Yes [provider]  tamsulosin (FLOMAX) 0.4 MG CAPS capsule Take 1 capsule (0.4 mg total) by mouth daily. 11/23/21  Yes McGowan, Carollee Herter A, PA-C  Accu-Chek Softclix Lancets lancets Use as instructed 08/13/21   Hoy Register, MD  acetaminophen (TYLENOL) 500 MG tablet Take 1,000 mg by mouth every 6 (six) hours as needed for moderate pain or headache.    [provider]  albuterol (VENTOLIN HFA) 108 (90 Base) MCG/ACT inhaler Inhale 2 puffs into the lungs every 6 (six) hours as needed for wheezing or shortness of breath. 08/13/21   Hoy Register, MD  aspirin 81 MG chewable tablet Chew 81 mg by mouth daily.    [provider]  Blood Glucose Monitoring Suppl (ACCU-CHEK GUIDE ME) w/Device KIT Use to check blood sugar once daily. 01/07/21   Hoy Register, MD  diazepam (VALIUM) 2 MG tablet Take 1 tablet (2 mg total) by mouth 2 (two) times daily. 08/15/21   Wynetta Fines, MD  glucose blood  (ACCU-CHEK GUIDE) test strip Use to check blood sugar once daily. 08/13/21   Hoy Register, MD  ketoconazole (NIZORAL) 2 % cream APPLY 1 APPLICATION. TOPICALLY DAILY. 02/28/22   Louann Sjogren, DPM  lamoTRIgine (LAMICTAL) 200 MG tablet Take 1 tablet (200 mg total) by mouth 2 (two) times daily. 02/20/23   Vaslow, Georgeanna Lea, MD  lidocaine (LIDODERM) 5 % Place 1 patch onto the skin daily. Remove & Discard patch within 12 hours or as directed by MD 04/07/22   Marita Kansas, PA-C  meclizine (ANTIVERT) 25 MG tablet Take 1 tablet (25 mg total) by mouth 3 (three) times daily as needed for dizziness. 01/04/22   Horton, Clabe Seal, DO  methocarbamol (ROBAXIN) 750 MG tablet Take 750 mg by mouth daily as needed for muscle spasms.    [provider]  predniSONE (STERAPRED UNI-PAK 21 TAB) 10 MG (21) TBPK tablet Take as directed on package Patient not taking: Reported on 03/31/2023 01/11/23   Magnant, Joycie Peek, PA-C  propranolol (INDERAL) 10 MG  tablet Take 1 tablet (10 mg total) by mouth 3 (three) times daily as needed (palpitations). 05/31/22   Swinyer, Zachary George, NP  tadalafil (CIALIS) 5 MG tablet Take 1 tablet (5 mg total) by mouth daily as needed for erectile dysfunction. 1 hour prior to sexual activity 01/17/22   Sondra Come, MD  VITAMIN A PO Take 1 capsule by mouth daily.    [provider]    Inpatient Medications: Scheduled Meds:  atorvastatin  20 mg Oral Daily   clonazePAM  0.5 mg Oral QHS   insulin aspart  0-15 Units Subcutaneous TID WC   insulin aspart  0-5 Units Subcutaneous QHS   lacosamide  100 mg Oral BID   lamoTRIgine  200 mg Oral BID   potassium chloride  40 mEq Oral Once   Continuous Infusions:  ceFEPime (MAXIPIME) IV Stopped (03/31/23 0625)   lactated ringers 150 mL/hr (03/31/23 0302)   PRN Meds: acetaminophen **OR** acetaminophen, albuterol, HYDROcodone-acetaminophen, LORazepam, ondansetron **OR** ondansetron (ZOFRAN) IV  Allergies:  No Known Allergies  Social  History:   Social History   Socioeconomic History   Marital status: Legally Separated    Spouse name: Not on file   Number of children: 2   Years of education: Not on file   Highest education level: Not on file  Occupational History   Not on file  Tobacco Use   Smoking status: Former    Current packs/day: 0.00    Average packs/day: (0.2 ttl pk-yrs)    Types: Cigarettes    Start date: 01/29/2004    Quit date: 01/29/2019    Years since quitting: 4.1    Passive exposure: Past   Smokeless tobacco: Never   Tobacco comments:    weekend smoker  Vaping Use   Vaping status: Some Days   Substances: Nicotine, Flavoring  Substance and Sexual Activity   Alcohol use: Yes    Comment: social   Drug use: No   Sexual activity: Not Currently  Other Topics Concern   Not on file  Social History Narrative      Caffeine- soda occass   Social Determinants of Health   Financial Resource Strain: Not on file  Food Insecurity: Not on file  Transportation Needs: No Transportation Needs (01/31/2019)   PRAPARE - Transportation    Lack of Transportation (Medical): No    Lack of Transportation (Non-Medical): No  Physical Activity: Not on file  Stress: Not on file  Social Connections: Not on file  Intimate Partner Violence: Not on file     Family History:   Family History  Problem Relation Age of Onset   Hypertension Mother    Dementia Mother    Diabetes Mother    Hypertension Father    CAD Father    Diabetes Father    CAD Brother     ROS:  Review of Systems  Constitutional:  Positive for malaise/fatigue. Negative for chills, diaphoresis, fever and weight loss.  HENT:  Negative for congestion.   Eyes:  Negative for discharge and redness.  Respiratory:  Negative for cough, sputum production, shortness of breath and wheezing.   Cardiovascular:  Negative for chest pain, palpitations, orthopnea, claudication, leg swelling and PND.  Gastrointestinal:  Positive for abdominal pain. Negative  for heartburn, nausea and vomiting.  Musculoskeletal:  Negative for falls and myalgias.  Skin:  Negative for rash.  Neurological:  Negative for dizziness, tingling, tremors, sensory change, speech change, focal weakness, loss of consciousness and weakness.  Endo/Heme/Allergies:  Does  not bruise/bleed easily.  Psychiatric/Behavioral:  Negative for substance abuse. The patient is not nervous/anxious.   All other systems reviewed and are negative.     Physical Exam/Data:   Vitals:   03/31/23 0530 03/31/23 0600 03/31/23 0630 03/31/23 0800  BP: (!) 91/57 (!) 89/57 (!) 87/62 91/60  Pulse: 74 75 73 71  Resp: 19 19 19 19   Temp:      TempSrc:      SpO2: 98% 97% 96% 98%  Weight:      Height:       No intake or output data in the 24 hours ending 03/31/23 0941 Filed Weights   03/30/23 1955  Weight: 77.1 kg   Body mass index is 25.1 kg/m.   Physical Exam: General: Well developed, well nourished, in no acute distress. Head: Normocephalic, atraumatic, sclera non-icteric, no xanthomas, nares without discharge.  Neck: Negative for carotid bruits. JVD not elevated. Lungs: Clear bilaterally to auscultation without wheezes, rales, or rhonchi. Breathing is unlabored. Heart: RRR with S1 S2. No murmurs, rubs, or gallops appreciated. Abdomen: Soft, non-tender, non-distended with normoactive bowel sounds. No hepatomegaly. No rebound/guarding. No obvious abdominal masses. Msk:  Strength and tone appear normal for age. Extremities: No clubbing or cyanosis. No edema. Distal pedal pulses are 2+ and equal bilaterally. Neuro: Alert and oriented X 3. No facial asymmetry. No focal deficit. Moves all extremities spontaneously. Psych:  Responds to questions appropriately with a normal affect.   EKG:  The EKG was personally reviewed and demonstrates: 03/30/2023, 20:11 - SVT, 159 bpm, RBBB.  03/30/2023, 23:36 - sinus tachycardia, 115 bpm, RBBB with nonspecific lateral ST-T changes Telemetry:  Telemetry was  personally reviewed and demonstrates: Maintaining sinus rhythm  Weights: Filed Weights   03/30/23 1955  Weight: 77.1 kg    Relevant CV Studies:  2D echo 11/24/2022: 1. Left ventricular ejection fraction, by estimation, is 65 to 70%. The  left ventricle has normal function. The left ventricle has no regional  wall motion abnormalities. Left ventricular diastolic parameters are  indeterminate.   2. Right ventricular systolic function is normal. The right ventricular  size is normal.   3. The mitral valve is normal in structure. Trivial mitral valve  regurgitation.   4. The aortic valve is tricuspid. Aortic valve regurgitation is not  visualized.   5. Aortic dilatation noted. There is mild dilatation of the aortic root,  measuring 39 mm.  __________  Event monitor 12/2021: Predominat rhythm was normal sinus rhythm with average heart rate 72bpm and ranged from 47 to 122bpm.   Rare PAC __________  Event monitor 12/2019: Sinus bradycardia to sinus tachycardia. No atrial fibrillation, no arrhythmias.   Normal 30 day event monitor with no evidence for atrial fibrillation. __________  Eugenie Birks MPI 12/18/2019: Nuclear stress EF: 60%. There was no ST segment deviation noted during stress. No T wave inversion was noted during stress. The study is normal. This is a low risk study. The left ventricular ejection fraction is normal (55-65%). ___________  Calcium score 12/18/2019: IMPRESSION: Coronary calcium score of 0. This was 0 percentile for age and sex matched control.  Laboratory Data:  Chemistry Recent Labs  Lab 03/30/23 2020 03/31/23 0501  NA 137 139  K 3.5 3.6  CL 103 109  CO2 17* 23  GLUCOSE 128* 106*  BUN 20 15  CREATININE 1.00 1.08  CALCIUM 9.0 7.7*  GFRNONAA >60 >60  ANIONGAP 17* 7    Recent Labs  Lab 03/30/23 2020  PROT 7.1  ALBUMIN  4.1  AST 29  ALT 22  ALKPHOS 74  BILITOT 0.6   Hematology Recent Labs  Lab 03/30/23 2020 03/31/23 0501  WBC 5.4  18.6*  RBC 5.02 3.89*  HGB 15.7 12.4*  HCT 45.9 36.3*  MCV 91.4 93.3  MCH 31.3 31.9  MCHC 34.2 34.2  RDW 12.2 12.6  PLT 201 162   Cardiac EnzymesNo results for input(s): "TROPONINI" in the last 168 hours. No results for input(s): "TROPIPOC" in the last 168 hours.  BNPNo results for input(s): "BNP", "PROBNP" in the last 168 hours.  DDimer No results for input(s): "DDIMER" in the last 168 hours.  Radiology/Studies:  CT CHEST ABDOMEN PELVIS W CONTRAST  Result Date: 03/30/2023 IMPRESSION: 1. Negative for acute airspace disease. 2. Enlarged prostate. Suspect haziness at the level of prostate but slightly limited due to streak artifact from right hip hardware. Question post biopsy change versus prostatitis. 3. Thick-walled bladder, cystitis versus chronic obstruction 4. Heterogenous appearance of C7, T1 and T2 on reconstructed images, not confirmed on corresponding axial views, this could be artifactual but recommend dedicated cervical CT for further assessment. 5. Aortic atherosclerosis. Aortic Atherosclerosis (ICD10-I70.0). Electronically Signed   By: Jasmine Pang M.D.   On: 03/30/2023 22:38   DG Chest Port 1 View  Result Date: 03/30/2023 IMPRESSION: No active cardiopulmonary disease. Electronically Signed   By: Aram Candela M.D.   On: 03/30/2023 21:48    Assessment and Plan:   1. SVT: -Brief episode overnight in the setting of acute illness -Maintaining sinus rhythm -BP precludes continuation of AV nodal blocking medication at this time -Monitor on telemetry -Check TSH and magnesium -Potassium low normal, replete to goal 4.0  2. Elevated troponin: -Never with chest pain -Mildly elevated and flat trending, peaking at 130 and subsequently downtrending, not consistent with ACS -No indication for heparin drip without dynamic troponin elevation -Obtain echo to evaluate for new wall motion abnormality or cardiomyopathy -No current plans for inpatient ischemic evaluation in the  setting of acute illness -Outpatient follow-up for further risk stratification  3.  Sepsis in the setting of suspected prostatitis with E. coli bacteremia: -Management per urology and internal medicine     For questions or updates, please contact [facility] Please consult [email or web address] for contact info under Cardiology/STEMI.   Signed, Eula Listen, PA-C [facility] Pager: [phone] 03/31/2023, 9:41 AM

## 2023-03-31 NOTE — Assessment & Plan Note (Signed)
No acute issues suspected

## 2023-03-31 NOTE — Assessment & Plan Note (Addendum)
Essential hypertension Soft blood pressures secondary to sepsis, improved with bolus Will hold home propranolol and amlodipine and resume as appropriate

## 2023-03-31 NOTE — ED Notes (Signed)
Explained to patient that he would be moved to [facility] and would be getting a bladder scan. Pt verbalized understanding.

## 2023-03-31 NOTE — Assessment & Plan Note (Signed)
Secondary to lactic acidosis Expecting improvement with fluid resuscitation

## 2023-03-31 NOTE — Assessment & Plan Note (Addendum)
History of palpitations Elevated troponin 29--> 130, suspect demand ischemia Suspect elevated troponin related to demand ischemia from tachycardia related to sepsis from paroxysmal SVT seen on EKG #1 Patient denies chest pain and no acute ST-T wave changes seen on EKG Has had prior cardiac evaluation for palpitations .  Will get echo to evaluate for wall motion abnormality Trend troponin to peak Will get a cardiology consult

## 2023-03-31 NOTE — TOC Initial Note (Addendum)
Transition of Care [facility]) - Initial/Assessment Note    Patient Details  Name: Joshua [name] MRN: [id number] Date of Birth: 28-Jul-1960  [facility] CM/SW Contact:    Marquita Palms, LCSW Phone Number: 03/31/2023, 10:45 AM  Clinical Narrative:                   High Risk Assessment completed for patient. CSW met with patient at bedside. Pt reports his pharmacy is [facility] in [place], [place]. He reports that his wife will be picking him up when he is discharged. He reports that he has and uses a cane at home. No other needs at this time.       Patient Goals and CMS Choice            Expected Discharge Plan and Services                                              Prior Living Arrangements/Services                       Activities of Daily Living      Permission Sought/Granted                  Emotional Assessment              Admission diagnosis:  Severe sepsis (HCC) [A41.9, R65.20] Patient Active Problem List   Diagnosis Date Noted   DM (diabetes mellitus) (HCC) 03/31/2023   Severe sepsis (HCC) 03/30/2023   Elevated troponin 03/30/2023   Paroxysmal SVT (supraventricular tachycardia) 03/30/2023   High anion gap metabolic acidosis 03/30/2023   Hematuria 03/30/2023   S/p prostate biopsy 03/29/23 03/30/2023   Hypotension 03/30/2023   History of recurrent meningioma s/p resection x2 03/30/2023   Aortic atherosclerosis (HCC) 10/20/2022   Pain in limb 06/14/2022   Pain due to onychomycosis of toenails of both feet 03/04/2021   Arthritis of right shoulder region    S/P reverse total shoulder arthroplasty, 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   Essential hypertension 07/02/2020   Prediabetes 07/02/2020   Polyuria 07/02/2020   Nontraumatic complete tear of right rotator cuff    Impingement syndrome of right shoulder    Neuropathy 12/05/2019   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   Seizures (HCC) 01/31/2019    Meningioma, recurrent of brain (HCC) 01/31/2019   Meningioma (HCC) 01/03/2019   Diffuse idiopathic skeletal hyperostosis 06/05/2013   PCP:  Alease Medina, MD Pharmacy:   [facility] - [place], [place] - [address] [place] [address] Phone: [phone] Fax: [phone]     Social Determinants of Health (SDOH) Social History: SDOH Screenings   Transportation Needs: No Transportation Needs (01/31/2019)  Depression (PHQ2-9): Low Risk  (12/03/2020)  Tobacco Use: Medium Risk (03/30/2023)   SDOH Interventions:     Readmission Risk Interventions    03/31/2023   10:34 AM  Readmission Risk Prevention Plan  Transportation Screening Complete  PCP or Specialist Appt within 3-5 Days Complete  HRI or Home Care Consult Complete  Social Work Consult for Recovery Care Planning/Counseling Not Complete  SW consult not completed comments if needed  Palliative Care Screening Not Applicable  Medication Review [facility]) Not Complete  Med Review Comments Will be completed on discharge with

## 2023-03-31 NOTE — Assessment & Plan Note (Addendum)
Severe sepsis Hematuria Anion gap metabolic acidosis/lactic acidosis Sepsis criteria include fever, tachycardia, tachypnea, lactic acidosis, hypotension UA could not be interpreted due to presence of pigment Suspecting acute infection related to recent prostate biopsy Continue sepsis fluids Continue cefepime Urology aware and formally consulted

## 2023-03-31 NOTE — Consult Note (Signed)
PHARMACY - PHYSICIAN COMMUNICATION CRITICAL VALUE ALERT - BLOOD CULTURE IDENTIFICATION (BCID)  Joshua [name] is an 62 y.o. male who presented to [facility] on 03/30/2023 with a chief complaint of hematuria since prostate biopsy on 9/11.  Assessment: 4/4 E.coli (non ESBL producing)  Name of physician (or Provider) Contacted: Dr. Clide Dales  Current antibiotics: Cefepime  Changes to prescribed antibiotics recommended:  Ceftriaxone 2 gm daily - Recommendations accepted by provider  Results for orders placed or performed during the hospital encounter of 03/30/23  Blood Culture ID Panel (Reflexed) (Collected: 03/30/2023  8:18 PM)  Result Value Ref Range   Enterococcus faecalis NOT DETECTED NOT DETECTED   Enterococcus Faecium NOT DETECTED NOT DETECTED   Listeria monocytogenes NOT DETECTED NOT DETECTED   Staphylococcus species NOT DETECTED NOT DETECTED   Staphylococcus aureus (BCID) NOT DETECTED NOT DETECTED   Staphylococcus epidermidis NOT DETECTED NOT DETECTED   Staphylococcus lugdunensis NOT DETECTED NOT DETECTED   Streptococcus species NOT DETECTED NOT DETECTED   Streptococcus agalactiae NOT DETECTED NOT DETECTED   Streptococcus pneumoniae NOT DETECTED NOT DETECTED   Streptococcus pyogenes NOT DETECTED NOT DETECTED   A.calcoaceticus-baumannii NOT DETECTED NOT DETECTED   Bacteroides fragilis NOT DETECTED NOT DETECTED   Enterobacterales DETECTED (A) NOT DETECTED   Enterobacter cloacae complex NOT DETECTED NOT DETECTED   Escherichia coli DETECTED (A) NOT DETECTED   Klebsiella aerogenes NOT DETECTED NOT DETECTED   Klebsiella oxytoca NOT DETECTED NOT DETECTED   Klebsiella pneumoniae NOT DETECTED NOT DETECTED   Proteus species NOT DETECTED NOT DETECTED   Salmonella species NOT DETECTED NOT DETECTED   Serratia marcescens NOT DETECTED NOT DETECTED   Haemophilus influenzae NOT DETECTED NOT DETECTED   Neisseria meningitidis NOT DETECTED NOT DETECTED   Pseudomonas aeruginosa NOT DETECTED  NOT DETECTED   Stenotrophomonas maltophilia NOT DETECTED NOT DETECTED   Candida albicans NOT DETECTED NOT DETECTED   Candida auris NOT DETECTED NOT DETECTED   Candida glabrata NOT DETECTED NOT DETECTED   Candida krusei NOT DETECTED NOT DETECTED   Candida parapsilosis NOT DETECTED NOT DETECTED   Candida tropicalis NOT DETECTED NOT DETECTED   Cryptococcus neoformans/gattii NOT DETECTED NOT DETECTED   CTX-M ESBL NOT DETECTED NOT DETECTED   Carbapenem resistance IMP NOT DETECTED NOT DETECTED   Carbapenem resistance KPC NOT DETECTED NOT DETECTED   Carbapenem resistance NDM NOT DETECTED NOT DETECTED   Carbapenem resist OXA 48 LIKE NOT DETECTED NOT DETECTED   Carbapenem resistance VIM NOT DETECTED NOT DETECTED   Littie Deeds, PharmD PGY1 Pharmacy Resident 03/31/2023  9:34 AM

## 2023-04-01 ENCOUNTER — Inpatient Hospital Stay (HOSPITAL_COMMUNITY)
Admit: 2023-04-01 | Discharge: 2023-04-01 | Disposition: A | Discharge status: Home / Self Care | Payer: 59 | Attending: Internal Medicine | Admitting: Internal Medicine

## 2023-04-01 DIAGNOSIS — N410 Acute prostatitis: Secondary | ICD-10-CM | POA: Diagnosis not present

## 2023-04-01 DIAGNOSIS — R7989 Other specified abnormal findings of blood chemistry: Secondary | ICD-10-CM

## 2023-04-01 DIAGNOSIS — I471 Supraventricular tachycardia, unspecified: Secondary | ICD-10-CM | POA: Diagnosis not present

## 2023-04-01 DIAGNOSIS — R509 Fever, unspecified: Secondary | ICD-10-CM

## 2023-04-01 DIAGNOSIS — A419 Sepsis, unspecified organism: Secondary | ICD-10-CM | POA: Diagnosis not present

## 2023-04-01 DIAGNOSIS — R6520 Severe sepsis without septic shock: Secondary | ICD-10-CM | POA: Diagnosis not present

## 2023-04-01 LAB — BASIC METABOLIC PANEL
Anion gap: 9 (ref 5–15)
BUN: 12 mg/dL (ref 8–23)
CO2: 25 mmol/L (ref 22–32)
Calcium: 8 mg/dL — ABNORMAL LOW (ref 8.9–10.3)
Chloride: 102 mmol/L (ref 98–111)
Creatinine, Ser: 0.94 mg/dL (ref 0.61–1.24)
GFR, Estimated: >60 mL/min (ref >60)
Glucose, Bld: 129 mg/dL — ABNORMAL HIGH (ref 70–99)
Potassium: 3.8 mmol/L (ref 3.5–5.1)
Sodium: 136 mmol/L (ref 135–145)

## 2023-04-01 LAB — GLUCOSE, CAPILLARY
Glucose-Capillary: 116 mg/dL — ABNORMAL HIGH (ref 70–99)
Glucose-Capillary: 146 mg/dL — ABNORMAL HIGH (ref 70–99)
Glucose-Capillary: 134 mg/dL — ABNORMAL HIGH (ref 70–99)
Glucose-Capillary: 107 mg/dL — ABNORMAL HIGH (ref 70–99)

## 2023-04-01 LAB — CBC
HCT: 37.8 % — ABNORMAL LOW (ref 39.0–52.0)
Hemoglobin: 13.1 g/dL (ref 13.0–17.0)
MCH: 31.8 pg (ref 26.0–34.0)
MCHC: 34.7 g/dL (ref 30.0–36.0)
MCV: 91.7 fL (ref 80.0–100.0)
Platelets: 161 10*3/uL (ref 150–400)
RBC: 4.12 MIL/uL — ABNORMAL LOW (ref 4.22–5.81)
RDW: 12.9 % (ref 11.5–15.5)
WBC: 14.7 10*3/uL — ABNORMAL HIGH (ref 4.0–10.5)
nRBC: 0 % (ref 0.0–0.2)

## 2023-04-01 LAB — HEMOGLOBIN A1C
Hgb A1c MFr Bld: 6.2 % — ABNORMAL HIGH (ref 4.8–5.6)
Mean Plasma Glucose: 131 mg/dL

## 2023-04-01 LAB — MAGNESIUM: Magnesium: 1.6 mg/dL — ABNORMAL LOW (ref 1.7–2.4)

## 2023-04-01 LAB — ECHOCARDIOGRAM COMPLETE
AR max vel: 2.76 cm2
AV Peak grad: 4.6 mmHg
Ao pk vel: 1.07 m/s
Area-P 1/2: 3.7 cm2
Height: 69 in
S' Lateral: 3.1 cm
Weight: 2720 [oz_av]

## 2023-04-01 LAB — PHOSPHORUS: Phosphorus: 2.2 mg/dL — ABNORMAL LOW (ref 2.5–4.6)

## 2023-04-01 MED ORDER — SODIUM CHLORIDE 0.9 % IV SOLN: INTRAVENOUS | Status: DC | PRN

## 2023-04-01 MED ORDER — POTASSIUM & SODIUM PHOSPHATES 280-160-250 MG PO PACK
1.0000 | PACK | Freq: Three times a day (TID) | ORAL | Status: AC
  Administered 2023-04-01 (×2): 1 via ORAL
  Filled 2023-04-01 (×3): qty 1

## 2023-04-01 MED ORDER — MAGNESIUM SULFATE 2 GM/50ML IV SOLN
2.0000 g | Freq: Once | INTRAVENOUS | Status: AC
  Administered 2023-04-01: 2 g via INTRAVENOUS
  Filled 2023-04-01: qty 50

## 2023-04-01 NOTE — Progress Notes (Signed)
  Echocardiogram 2D Echocardiogram has been performed.  Joshua [name] 04/01/2023, 3:02 PM

## 2023-04-01 NOTE — Progress Notes (Signed)
Progress Note  Patient Name: Joshua [name] Date of Encounter: 04/01/2023  Primary Cardiologist: Mayford Knife  Subjective   No chest pain, dyspnea, palpitations, or symptoms of cardiac decompensation. Abdominal pain improving. Echo pending.  Inpatient Medications    Scheduled Meds:  atorvastatin  20 mg Oral Daily   clonazePAM  0.5 mg Oral QHS   insulin aspart  0-15 Units Subcutaneous TID WC   insulin aspart  0-5 Units Subcutaneous QHS   lacosamide  100 mg Oral BID   lamoTRIgine  200 mg Oral BID   tamsulosin  0.4 mg Oral Daily   Continuous Infusions:  cefTRIAXone (ROCEPHIN)  IV Stopped (03/31/23 1425)   PRN Meds: acetaminophen **OR** acetaminophen, albuterol, HYDROcodone-acetaminophen, LORazepam, methocarbamol, ondansetron **OR** ondansetron (ZOFRAN) IV   Vital Signs    Vitals:   04/01/23 0011 04/01/23 0034 04/01/23 0322 04/01/23 0718  BP: 99/64  119/67 115/67  Pulse: 91  88 100  Resp:  20  16  Temp: 98.8 F (37.1 C)  99.6 F (37.6 C) (!) 101.2 F (38.4 C)  TempSrc: Oral  Oral Oral  SpO2: 96%  97% 97%  Weight:      Height:        Intake/Output Summary (Last 24 hours) at 04/01/2023 1015 Last data filed at 04/01/2023 0900 Gross per 24 hour  Intake 120 ml  Output 800 ml  Net -680 ml   Filed Weights   03/30/23 1955  Weight: 77.1 kg    Telemetry    SR - Personally Reviewed  ECG    No new tracings - Personally Reviewed  Physical Exam   GEN: No acute distress.   Neck: No JVD. Cardiac: RRR, no murmurs, rubs, or gallops.  Respiratory: Clear to auscultation bilaterally.  GI: Soft, nontender, non-distended.   MS: No edema; No deformity. Neuro:  Alert and oriented x 3; Nonfocal.  Psych: Normal affect.  Labs    Chemistry Recent Labs  Lab 03/30/23 2020 03/31/23 0501 04/01/23 0439  NA 137 139 136  K 3.5 3.6 3.8  CL 103 109 102  CO2 17* 23 25  GLUCOSE 128* 106* 129*  BUN 20 15 12   CREATININE 1.00 1.08 0.94  CALCIUM 9.0 7.7* 8.0*  PROT 7.1  --    --   ALBUMIN 4.1  --   --   AST 29  --   --   ALT 22  --   --   ALKPHOS 74  --   --   BILITOT 0.6  --   --   GFRNONAA >60 >60 >60  ANIONGAP 17* 7 9     Hematology Recent Labs  Lab 03/30/23 2020 03/31/23 0501 04/01/23 0439  WBC 5.4 18.6* 14.7*  RBC 5.02 3.89* 4.12*  HGB 15.7 12.4* 13.1  HCT 45.9 36.3* 37.8*  MCV 91.4 93.3 91.7  MCH 31.3 31.9 31.8  MCHC 34.2 34.2 34.7  RDW 12.2 12.6 12.9  PLT 201 162 161    Cardiac EnzymesNo results for input(s): "TROPONINI" in the last 168 hours. No results for input(s): "TROPIPOC" in the last 168 hours.   BNPNo results for input(s): "BNP", "PROBNP" in the last 168 hours.   DDimer No results for input(s): "DDIMER" in the last 168 hours.   Radiology    CT CHEST ABDOMEN PELVIS W CONTRAST  Result Date: 03/30/2023 IMPRESSION: 1. Negative for acute airspace disease. 2. Enlarged prostate. Suspect haziness at the level of prostate but slightly limited due to streak artifact from right hip hardware. Question  post biopsy change versus prostatitis. 3. Thick-walled bladder, cystitis versus chronic obstruction 4. Heterogenous appearance of C7, T1 and T2 on reconstructed images, not confirmed on corresponding axial views, this could be artifactual but recommend dedicated cervical CT for further assessment. 5. Aortic atherosclerosis. Aortic Atherosclerosis (ICD10-I70.0). Electronically Signed   By: Jasmine Pang M.D.   On: 03/30/2023 22:38   DG Chest Port 1 View  Result Date: 03/30/2023 IMPRESSION: No active cardiopulmonary disease. Electronically Signed   By: Aram Candela M.D.   On: 03/30/2023 21:48    Cardiac Studies   2D echo 11/24/2022: 1. Left ventricular ejection fraction, by estimation, is 65 to 70%. The  left ventricle has normal function. The left ventricle has no regional  wall motion abnormalities. Left ventricular diastolic parameters are  indeterminate.   2. Right ventricular systolic function is normal. The right ventricular  size  is normal.   3. The mitral valve is normal in structure. Trivial mitral valve  regurgitation.   4. The aortic valve is tricuspid. Aortic valve regurgitation is not  visualized.   5. Aortic dilatation noted. There is mild dilatation of the aortic root,  measuring 39 mm.  __________   Event monitor 12/2021: Predominat rhythm was normal sinus rhythm with average heart rate 72bpm and ranged from 47 to 122bpm.   Rare PAC __________   Event monitor 12/2019: Sinus bradycardia to sinus tachycardia. No atrial fibrillation, no arrhythmias.   Normal 30 day event monitor with no evidence for atrial fibrillation. __________   Eugenie Birks MPI 12/18/2019: Nuclear stress EF: 60%. There was no ST segment deviation noted during stress. No T wave inversion was noted during stress. The study is normal. This is a low risk study. The left ventricular ejection fraction is normal (55-65%). ___________   Calcium score 12/18/2019: IMPRESSION: Coronary calcium score of 0. This was 0 percentile for age and sex matched control.  Patient Profile     62 y.o. male with history of aortic atherosclerosis, HTN, malignant meningioma s/p resection in 2014 with recurrence in 2020 s/p resection with seizures, and palpitations who is admitted with sepsis secondary to E. coli bacteremia in the setting of suspected prostatitis and is being seen today for the evaluation of SVT and elevated troponin at the request of Dr. Para March.   Assessment & Plan    1. SVT vs sinus tachycardia: -Brief episode of likely SVT overnight in the setting of acute illness -Maintaining sinus rhythm -BP precludes continuation of AV nodal blocking medication at this time -Monitor on telemetry -TSH normal -Potassium normal   2. Elevated troponin: -Never with chest pain -Mildly elevated and flat trending, peaking at 130 and subsequently downtrending, not consistent with ACS -No indication for heparin drip without dynamic troponin  elevation -Obtain echo to evaluate for new wall motion abnormality or cardiomyopathy -No current plans for inpatient ischemic evaluation in the setting of acute illness -Outpatient follow-up for further risk stratification   3.  Sepsis in the setting of suspected prostatitis with E. coli bacteremia: -Management per urology and internal medicine    4. Hypomagnesemia: -Replete to goal 2.0      For questions or updates, please contact [facility] Please consult [email or web address] for contact info under Cardiology/STEMI.    Signed, Eula Listen, PA-C [facility] Pager: [phone] 04/01/2023, 10:15 AM

## 2023-04-01 NOTE — Plan of Care (Signed)
Alert and oriented x4. Daughter Jerrel Ivory visited today and updated on plan of care. Vitals remains stable. Medicated for headache with adequate relief. Educated patient that per Dr. Lucianne Muss we are awaiting sensitivities on the cultures to determine when he will be able to discharge home.    Problem: Education: Goal: Ability to describe self-care measures that may prevent or decrease complications (Diabetes Survival Skills Education) will improve Outcome: Progressing Goal: Individualized Educational Video(s) Outcome: Progressing   Problem: Coping: Goal: Ability to adjust to condition or change in health will improve Outcome: Progressing   Problem: Fluid Volume: Goal: Ability to maintain a balanced intake and output will improve Outcome: Progressing   Problem: Health Behavior/Discharge Planning: Goal: Ability to identify and utilize available resources and services will improve Outcome: Progressing Goal: Ability to manage health-related needs will improve Outcome: Progressing   Problem: Metabolic: Goal: Ability to maintain appropriate glucose levels will improve Outcome: Progressing   Problem: Nutritional: Goal: Maintenance of adequate nutrition will improve Outcome: Progressing Goal: Progress toward achieving an optimal weight will improve Outcome: Progressing   Problem: Skin Integrity: Goal: Risk for impaired skin integrity will decrease Outcome: Progressing   Problem: Tissue Perfusion: Goal: Adequacy of tissue perfusion will improve Outcome: Progressing   Problem: Fluid Volume: Goal: Hemodynamic stability will improve Outcome: Progressing   Problem: Clinical Measurements: Goal: Diagnostic test results will improve Outcome: Progressing Goal: Signs and symptoms of infection will decrease Outcome: Progressing   Problem: Respiratory: Goal: Ability to maintain adequate ventilation will improve Outcome: Progressing   Problem: Education: Goal: Knowledge of General  Education information will improve Description: Including pain rating scale, medication(s)/side effects and non-pharmacologic comfort measures Outcome: Progressing   Problem: Health Behavior/Discharge Planning: Goal: Ability to manage health-related needs will improve Outcome: Progressing   Problem: Clinical Measurements: Goal: Ability to maintain clinical measurements within normal limits will improve Outcome: Progressing Goal: Will remain free from infection Outcome: Progressing Goal: Diagnostic test results will improve Outcome: Progressing Goal: Respiratory complications will improve Outcome: Progressing Goal: Cardiovascular complication will be avoided Outcome: Progressing   Problem: Activity: Goal: Risk for activity intolerance will decrease Outcome: Progressing   Problem: Nutrition: Goal: Adequate nutrition will be maintained Outcome: Progressing   Problem: Coping: Goal: Level of anxiety will decrease Outcome: Progressing   Problem: Elimination: Goal: Will not experience complications related to bowel motility Outcome: Progressing Goal: Will not experience complications related to urinary retention Outcome: Progressing   Problem: Pain Managment: Goal: General experience of comfort will improve Outcome: Progressing   Problem: Safety: Goal: Ability to remain free from injury will improve Outcome: Progressing   Problem: Skin Integrity: Goal: Risk for impaired skin integrity will decrease Outcome: Progressing

## 2023-04-01 NOTE — Progress Notes (Signed)
[facility] Progress Note  Patient: Joshua [name]    HQI:[id number]  DOA: 03/30/2023     Date of Service: the patient was seen and examined on 04/01/2023  Chief Complaint  Patient presents with   Fever   Brief hospital course: Joshua [name] is a 62 y.o. male with medical history significant for Malignant meningioma s/p resection 2014 and 2020, seizures, HTN, anxiety, palpitations, diabetes type 2, BPH s/p prostate biopsy on 9/11 presented for worsening pain since the procedure, hematuria and fever.  In the ED patient had fever of 103.1, tachycardic with pulse rate 157, BP 87/62, lactic acid 6.5, troponin bump.  Urinalysis not reported due to urine pigment.  CT abdomen pelvis showed enlarged prostate findings suspicious for prostatitis postbiopsy.  Patient is admitted to the hospitalist service for severe sepsis due to prostatitis.   Assessment and Plan: Severe sepsis due to prostatitis S/p prostate biopsy 03/29/23 Hematuria Gram negative bacteremia Sepsis criteria include fever, tachycardia, tachypnea, lactic acidosis, hypotension UA could not be interpreted due to presence of pigment Suspecting acute infection related to recent prostate biopsy S/p sepsis fluids, VS stable now Cefepime changed to Rocephin per pharmacy protocol, follow final culture/ sensitivities. Urology consult appreciated-bladder scan, antibiotic therapy for 14 days.   Paroxysmal SVT (supraventricular tachycardia) History of palpitations Peak troponin 130, trending down Demand ischemia Had prior episodes of palpitations. Elevated troponin likely demand ischemia. Echocardiogram pending. Cardiology evaluation appreciated, sinus tachycardia versus SVT due to severe sepsis.  Further workup pending echocardiogram.   Hypomagnesemia, mag repleted. Hypophosphatemia, Phos repleted. Monitor electrolytes and replete as needed.   Essential hypertension Hold antihypertensive medications as his blood pressure  is lower side. Propranolol and amlodipine will be resumed if blood pressure persistently high.   High anion gap metabolic acidosis Secondary to lactic acidosis. Resolved s/p IVF resuscitation Metabolic acidosis resolved, anion gap 9 within normal range Monitor BMP daily   DM (diabetes mellitus) (HCC) Sliding scale insulin coverage. A1c 6.3 three months ago.   History of recurrent meningioma s/p resection x2 Stable.    Seizures (HCC) Continue Vimpat and Lamictal Ativan as needed seizures.   Body mass index is 25.1 kg/m.  Interventions:  Diet: Heart healthy/carb modified diet DVT Prophylaxis: SCD, pharmacological prophylaxis contraindicated due to hematuria    Advance goals of care discussion: Full code  Family Communication: family was not present at bedside, at the time of interview.  The pt provided permission to discuss medical plan with the family. Opportunity was given to ask question and all questions were answered satisfactorily.   Disposition:  Pt is from Home, admitted with sepsis, E. coli bacteremia, still on IV antibiotics, awaiting for blood culture sensitivity report, which precludes a safe discharge. Discharge to Home, when stable, most likely in 1 to 2 days.  Subjective: No significant events overnight, patient was complaining of slight headache and sweating but no fever or chills.  Denied any abdominal pain, no chest pain no palpitation, no shortness of breath.  Physical Exam: General: NAD, lying comfortably Appear in no distress, affect appropriate Eyes: PERRLA ENT: Oral Mucosa Clear, moist  Neck: no JVD,  Cardiovascular: S1 and S2 Present, no Murmur,  Respiratory: good respiratory effort, Bilateral Air entry equal and Decreased, no Crackles, no wheezes Abdomen: Bowel Sound present, Soft and no tenderness,  Skin: no rashes Extremities: no Pedal edema, no calf tenderness Neurologic: without any new focal findings Gait not checked due to patient safety  concerns  Vitals:   04/01/23 0034 04/01/23 1324  04/01/23 0718 04/01/23 1209  BP:  119/67 115/67 (!) 101/4  Pulse:  88 100 74  Resp: 20  16 19   Temp:  99.6 F (37.6 C) (!) 101.2 F (38.4 C) 98.2 F (36.8 C)  TempSrc:  Oral Oral Oral  SpO2:  97% 97% 98%  Weight:      Height:        Intake/Output Summary (Last 24 hours) at 04/01/2023 1445 Last data filed at 04/01/2023 1358 Gross per 24 hour  Intake 277.97 ml  Output 800 ml  Net -522.03 ml   Filed Weights   03/30/23 1955  Weight: 77.1 kg    Data Reviewed: I have personally reviewed and interpreted daily labs, tele strips, imagings as discussed above. I reviewed all nursing notes, pharmacy notes, vitals, pertinent old records I have discussed plan of care as described above with RN and patient/family.  CBC: Recent Labs  Lab 03/30/23 2020 03/31/23 0501 04/01/23 0439  WBC 5.4 18.6* 14.7*  NEUTROABS 5.1  --   --   HGB 15.7 12.4* 13.1  HCT 45.9 36.3* 37.8*  MCV 91.4 93.3 91.7  PLT 201 162 161   Basic Metabolic Panel: Recent Labs  Lab 03/30/23 2020 03/31/23 0501 04/01/23 0439  NA 137 139 136  K 3.5 3.6 3.8  CL 103 109 102  CO2 17* 23 25  GLUCOSE 128* 106* 129*  BUN 20 15 12   CREATININE 1.00 1.08 0.94  CALCIUM 9.0 7.7* 8.0*  MG  --  1.3* 1.6*  PHOS  --   --  2.2*    Studies: No results found.  Scheduled Meds:  atorvastatin  20 mg Oral Daily   clonazePAM  0.5 mg Oral QHS   insulin aspart  0-15 Units Subcutaneous TID WC   insulin aspart  0-5 Units Subcutaneous QHS   lacosamide  100 mg Oral BID   lamoTRIgine  200 mg Oral BID   tamsulosin  0.4 mg Oral Daily   Continuous Infusions:  sodium chloride Stopped (04/01/23 1357)   cefTRIAXone (ROCEPHIN)  IV 200 mL/hr at 04/01/23 1358   PRN Meds: sodium chloride, acetaminophen **OR** acetaminophen, albuterol, HYDROcodone-acetaminophen, LORazepam, methocarbamol, ondansetron **OR** ondansetron (ZOFRAN) IV  Time spent: 35 minutes  Author: Gillis Santa.  MD [facility] 04/01/2023 2:45 PM  To reach On-call, see care teams to locate the attending and reach out to them via [email or web address]. If 7PM-7AM, please contact night-coverage If you still have difficulty reaching the attending provider, please page the [facility] (Director on Call) for [facility] on amion for assistance.

## 2023-04-01 NOTE — Plan of Care (Signed)
  Problem: Coping: Goal: Ability to adjust to condition or change in health will improve Outcome: Progressing   Problem: Fluid Volume: Goal: Ability to maintain a balanced intake and output will improve Outcome: Progressing   Problem: Health Behavior/Discharge Planning: Goal: Ability to identify and utilize available resources and services will improve Outcome: Progressing   Problem: Metabolic: Goal: Ability to maintain appropriate glucose levels will improve Outcome: Progressing   Problem: Nutritional: Goal: Maintenance of adequate nutrition will improve Outcome: Progressing   Problem: Health Behavior/Discharge Planning: Goal: Ability to manage health-related needs will improve Outcome: Progressing   Problem: Clinical Measurements: Goal: Ability to maintain clinical measurements within normal limits will improve Outcome: Progressing Goal: Will remain free from infection Outcome: Progressing   Problem: Activity: Goal: Risk for activity intolerance will decrease Outcome: Progressing

## 2023-04-02 DIAGNOSIS — A419 Sepsis, unspecified organism: Secondary | ICD-10-CM | POA: Diagnosis not present

## 2023-04-02 DIAGNOSIS — R6520 Severe sepsis without septic shock: Secondary | ICD-10-CM | POA: Diagnosis not present

## 2023-04-02 LAB — CBC
HCT: 36.9 % — ABNORMAL LOW (ref 39.0–52.0)
Hemoglobin: 12.3 g/dL — ABNORMAL LOW (ref 13.0–17.0)
MCH: 31.2 pg (ref 26.0–34.0)
MCHC: 33.3 g/dL (ref 30.0–36.0)
MCV: 93.7 fL (ref 80.0–100.0)
Platelets: 158 10*3/uL (ref 150–400)
RBC: 3.94 MIL/uL — ABNORMAL LOW (ref 4.22–5.81)
RDW: 12.7 % (ref 11.5–15.5)
WBC: 8.8 10*3/uL (ref 4.0–10.5)
nRBC: 0 % (ref 0.0–0.2)

## 2023-04-02 LAB — CULTURE, BLOOD (ROUTINE X 2): Special Requests: ADEQUATE

## 2023-04-02 LAB — BASIC METABOLIC PANEL
Anion gap: 8 (ref 5–15)
BUN: 11 mg/dL (ref 8–23)
CO2: 26 mmol/L (ref 22–32)
Calcium: 8.2 mg/dL — ABNORMAL LOW (ref 8.9–10.3)
Chloride: 104 mmol/L (ref 98–111)
Creatinine, Ser: 0.9 mg/dL (ref 0.61–1.24)
GFR, Estimated: >60 mL/min (ref >60)
Glucose, Bld: 116 mg/dL — ABNORMAL HIGH (ref 70–99)
Potassium: 4 mmol/L (ref 3.5–5.1)
Sodium: 138 mmol/L (ref 135–145)

## 2023-04-02 LAB — MAGNESIUM: Magnesium: 2.2 mg/dL (ref 1.7–2.4)

## 2023-04-02 LAB — GLUCOSE, CAPILLARY
Glucose-Capillary: 94 mg/dL (ref 70–99)
Glucose-Capillary: 129 mg/dL — ABNORMAL HIGH (ref 70–99)

## 2023-04-02 LAB — PHOSPHORUS: Phosphorus: 3.3 mg/dL (ref 2.5–4.6)

## 2023-04-02 MED ORDER — CEFDINIR 300 MG PO CAPS: 300.0000 mg | ORAL_CAPSULE | Freq: Two times a day (BID) | ORAL | 0 refills | Status: AC

## 2023-04-02 NOTE — Discharge Summary (Signed)
[facility] Discharge Summary   Patient: Joshua [name] WJX:[id number]  PCP: Alease Medina, MD  Date of admission: 03/30/2023   Date of discharge:  04/02/2023     Discharge Diagnoses:  Principal Problem:   Severe sepsis [facility]) Active Problems:   S/p prostate biopsy 03/29/23   Hematuria   Elevated troponin   Paroxysmal SVT (supraventricular tachycardia)   Hypotension   High anion gap metabolic acidosis   Seizures (HCC)   History of recurrent meningioma s/p resection x2   DM (diabetes mellitus) (HCC)   Demand ischemia of myocardium   Acute prostatitis   Admitted From: Home Disposition:  Home   Recommendations for Outpatient Follow-up:  Follow-up with PCP in 1 week, monitor BP at home and resume home medications if blood pressure remains high. Follow-up with urologist in 1 week as an outpatient. Follow up LABS/TEST:     Diet recommendation: Cardiac and Carb modified diet  Activity: The patient is advised to gradually reintroduce usual activities, as tolerated  Discharge Condition: stable  Code Status: Full code   History of present illness: As per the H and P dictated on admission Hospital Course:  Joshua [name] is a 62 y.o. male with medical history significant for Malignant meningioma s/p resection 2014 and 2020, seizures, HTN, anxiety, palpitations, diabetes type 2, BPH s/p prostate biopsy on 9/11 presented for worsening pain since the procedure, hematuria and fever.  In the ED patient had fever of 103.1, tachycardic with pulse rate 157, BP 87/62, lactic acid 6.5, troponin bump.  Urinalysis not reported due to urine pigment.  CT abdomen pelvis showed enlarged prostate findings suspicious for prostatitis postbiopsy.  Patient is admitted to the hospitalist service for severe sepsis due to prostatitis.    Assessment and Plan: Severe sepsis due to prostatitis, E. coli bacteremia S/p prostate biopsy 03/29/23, Hematuria, resolved.  Sepsis criteria include fever,  tachycardia, tachypnea, lactic acidosis, hypotension UA could not be interpreted due to presence of pigment. Suspecting acute infection related to recent prostate biopsy. S/p sepsis fluids, VS stable now. Cefepime changed to Rocephin per pharmacy protocol, blood culture growing E. coli, sensitive to cephalosporin, resistant to fluoroquinolone and Unasyn. Urology consult appreciated-bladder scan, antibiotic therapy for 14 days. As per sensitivity report patient was discharged on Omnicef 300 mg p.o. twice daily for 10 days.   Paroxysmal SVT (supraventricular tachycardia) and Demand ischemia History of palpitations. Peak troponin 130, trending down.  Had prior episodes of palpitations. Elevated troponin likely demand ischemia. TTE LVEF 55 to 60%, grade 1 diastolic dysfunction, no wall motion abnormality. Cardiology consulted, troponin slightly elevated does not meet criteria for non-STEMI, most likely due to sepsis, demand ischemia.  TTE done, no cardiac workup recommended.   Hypomagnesemia, mag repleted. Hypophosphatemia, Phos repleted. Essential hypertension: Held antihypertensive medications during hospital stay as his blood pressure was low, so soft but within normal range.  Resumed amlodipine and propranolol, patient was advised to monitor BP at home and then resume if patient's blood pressure persistently remains high.  Monitor BP at home and follow with PCP to titrate medications accordingly  High anion gap metabolic acidosis. Resolved  Secondary to lactic acidosis. Resolved s/p IVF resuscitation Metabolic acidosis resolved, anion gap 9 within normal range  DM (diabetes mellitus) (HCC) s/p Sliding scale insulin coverage. A1c 6.3 three months ago.  Resumed home medications, continue diabetic diet.   History of recurrent meningioma s/p resection x2. Stable.  Seizure disorder: Continue Vimpat and Lamictal. Ativan as needed seizures.   Body  mass index is 25.1 kg/m.  Nutrition  Interventions:  - Patient was instructed, not to drive, operate heavy machinery, perform activities at heights, swimming or participation in water activities or provide baby sitting services while on Pain, Sleep and Anxiety Medications; until his outpatient Physician has advised to do so again.  - Also recommended to not to take more than prescribed Pain, Sleep and Anxiety Medications.  Patient was ambulatory without any assistance. On the day of the discharge the patient's vitals were stable, and no other acute medical condition were reported by patient. the patient was felt safe to be discharge at Home.  Consultants: Cardiologist and neurologist Procedures: None  Discharge Exam: General: Appear in no distress, no Rash; Oral Mucosa Clear, moist. Cardiovascular: S1 and S2 Present, no Murmur, Respiratory: normal respiratory effort, Bilateral Air entry present and no Crackles, no wheezes Abdomen: Bowel Sound present, Soft and no tenderness, no hernia Extremities: no Pedal edema, no calf tenderness Neurology: alert and oriented to time, place, and person affect appropriate.  Filed Weights   03/30/23 1955  Weight: 77.1 kg   Vitals:   04/02/23 0802 04/02/23 1240  BP: 119/76 116/68  Pulse: 66 67  Resp: 18 16  Temp: 98.9 F (37.2 C) 98 F (36.7 C)  SpO2: 97% 98%    DISCHARGE MEDICATION: Allergies as of 04/02/2023   No Known Allergies      Medication List     STOP taking these medications    diazepam 2 MG tablet Commonly known as: VALIUM   ketoconazole 2 % cream Commonly known as: NIZORAL   predniSONE 10 MG (21) Tbpk tablet Commonly known as: STERAPRED UNI-PAK 21 TAB       TAKE these medications    acetaminophen 500 MG tablet Commonly known as: TYLENOL Take 1,000 mg by mouth every 6 (six) hours as needed for moderate pain or headache.   albuterol 108 (90 Base) MCG/ACT inhaler Commonly known as: VENTOLIN HFA Inhale 2 puffs into the lungs every 6 (six) hours as  needed for wheezing or shortness of breath.   amLODipine 5 MG tablet Commonly known as: NORVASC TAKE 1 TABLET (5 MG TOTAL) BY MOUTH DAILY. TO LOWER BLOOD PRESSURE. STOP LOSARTAN   aspirin 81 MG chewable tablet Chew 81 mg by mouth daily.   atorvastatin 20 MG tablet Commonly known as: LIPITOR TAKE 1 TABLET (20 MG TOTAL) BY MOUTH DAILY.   cefdinir 300 MG capsule Commonly known as: OMNICEF Take 1 capsule (300 mg total) by mouth 2 (two) times daily for 10 days.   clonazePAM 0.5 MG tablet Commonly known as: KLONOPIN TAKE 1 TABLET (0.5 MG TOTAL) BY MOUTH AT BEDTIME.   Lacosamide 100 MG Tabs TAKE 1 TABLET (100 MG TOTAL) BY MOUTH IN THE MORNING AND AT BEDTIME.   lamoTRIgine 200 MG tablet Commonly known as: LAMICTAL Take 1 tablet (200 mg total) by mouth 2 (two) times daily. What changed: Another medication with the same name was removed. Continue taking this medication, and follow the directions you see here.   lidocaine 5 % Commonly known as: Lidoderm Place 1 patch onto the skin daily. Remove & Discard patch within 12 hours or as directed by MD   meclizine 25 MG tablet Commonly known as: ANTIVERT Take 1 tablet (25 mg total) by mouth 3 (three) times daily as needed for dizziness.   metFORMIN 500 MG tablet Commonly known as: GLUCOPHAGE Take 1 tablet (500 mg total) by mouth 2 (two) times daily.   methocarbamol 750 MG  tablet Commonly known as: ROBAXIN Take 750 mg by mouth daily as needed for muscle spasms.   omeprazole 20 MG capsule Commonly known as: PRILOSEC Take 20 mg by mouth daily.   propranolol 10 MG tablet Commonly known as: INDERAL Take 1 tablet (10 mg total) by mouth 3 (three) times daily as needed (palpitations).   tadalafil 5 MG tablet Commonly known as: CIALIS Take 1 tablet (5 mg total) by mouth daily as needed for erectile dysfunction. 1 hour prior to sexual activity   tamsulosin 0.4 MG Caps capsule Commonly known as: FLOMAX Take 1 capsule (0.4 mg total) by  mouth daily.   VITAMIN A PO Take 1 capsule by mouth daily.       No Known Allergies Discharge Instructions     Call MD for:  difficulty breathing, headache or visual disturbances   Complete by: As directed    Call MD for:  extreme fatigue   Complete by: As directed    Call MD for:  persistant dizziness or light-headedness   Complete by: As directed    Call MD for:  persistant nausea and vomiting   Complete by: As directed    Call MD for:  severe uncontrolled pain   Complete by: As directed    Call MD for:  temperature >100.4   Complete by: As directed    Diet - low sodium heart healthy   Complete by: As directed    Discharge instructions   Complete by: As directed    Follow-up with PCP in 1 week, monitor BP at home and resume home medications if blood pressure remains high. Follow-up with urologist in 1 week as an outpatient.   Increase activity slowly   Complete by: As directed        The results of significant diagnostics from this hospitalization (including imaging, microbiology, ancillary and laboratory) are listed below for reference.    Significant Diagnostic Studies: ECHOCARDIOGRAM COMPLETE  Result Date: 04/01/2023    ECHOCARDIOGRAM REPORT   Patient Name:   AERICK SIMEK Franta Date of Exam: 04/01/2023 Medical Rec #:  [id number]         Height:       69.0 in Accession #:    [id number]        Weight:       170.0 lb Date of Birth:  02/15/1961          BSA:          1.928 m Patient Age:    62 years          BP:           101/40 mmHg Patient Gender: M                 HR:           67 bpm. Exam Location:  [facility] Procedure: 2D Echo and Strain Analysis Indications:     Elevated Troponin  History:         Patient has prior history of Echocardiogram examinations, most                  recent 11/24/2022.  Sonographer:     Overton Mam RDCS, FASE Referring Phys:  [id number] Andris Baumann Diagnosing Phys: Debbe Odea MD  Sonographer Comments: Technically difficult study due to poor  echo windows. Global longitudinal strain was attempted. Challenging study due to lung interference. IMPRESSIONS  1. Left ventricular ejection fraction, by estimation, is 55 to 60%. Left ventricular ejection  fraction by PLAX is 57 %. The left ventricle has normal function. The left ventricle has no regional wall motion abnormalities. There is mild left ventricular hypertrophy. Left ventricular diastolic parameters are consistent with Grade I diastolic dysfunction (impaired relaxation). The average left ventricular global longitudinal strain is -17.0 %. The global longitudinal strain is normal.  2. Right ventricular systolic function is normal. The right ventricular size is normal. There is normal pulmonary artery systolic pressure.  3. The mitral valve is normal in structure. Mild mitral valve regurgitation.  4. The aortic valve is tricuspid. Aortic valve regurgitation is not visualized.  5. The inferior vena cava is dilated in size with >50% respiratory variability, suggesting right atrial pressure of 8 mmHg. FINDINGS  Left Ventricle: Left ventricular ejection fraction, by estimation, is 55 to 60%. Left ventricular ejection fraction by PLAX is 57 %. The left ventricle has normal function. The left ventricle has no regional wall motion abnormalities. The average left ventricular global longitudinal strain is -17.0 %. The global longitudinal strain is normal. The left ventricular internal cavity size was normal in size. There is mild left ventricular hypertrophy. Left ventricular diastolic parameters are consistent with Grade I diastolic dysfunction (impaired relaxation). Right Ventricle: The right ventricular size is normal. No increase in right ventricular wall thickness. Right ventricular systolic function is normal. There is normal pulmonary artery systolic pressure. The tricuspid regurgitant velocity is 2.60 m/s, and  with an assumed right atrial pressure of 8 mmHg, the estimated right ventricular systolic  pressure is 35.0 mmHg. Left Atrium: Left atrial size was normal in size. Right Atrium: Right atrial size was normal in size. Pericardium: There is no evidence of pericardial effusion. Mitral Valve: The mitral valve is normal in structure. Mild mitral valve regurgitation. Tricuspid Valve: The tricuspid valve is normal in structure. Tricuspid valve regurgitation is not demonstrated. Aortic Valve: The aortic valve is tricuspid. Aortic valve regurgitation is not visualized. Aortic valve peak gradient measures 4.6 mmHg. Pulmonic Valve: The pulmonic valve was not well visualized. Pulmonic valve regurgitation is not visualized. Aorta: The aortic root is normal in size and structure. Venous: The inferior vena cava is dilated in size with greater than 50% respiratory variability, suggesting right atrial pressure of 8 mmHg. IAS/Shunts: No atrial level shunt detected by color flow Doppler.  LEFT VENTRICLE PLAX 2D LV EF:         Left            Diastology                ventricular     LV e' medial:    8.92 cm/s                ejection        LV E/e' medial:  6.2                fraction by     LV e' lateral:   13.10 cm/s                PLAX is 57      LV E/e' lateral: 4.2                %. LVIDd:         4.40 cm         2D LVIDs:         3.10 cm         Longitudinal LV PW:         1.20  cm         Strain LV IVS:        1.20 cm         2D Strain GLS  -17.0 % LVOT diam:     2.10 cm         Avg: LV SV:         65 LV SV Index:   34 LVOT Area:     3.46 cm  RIGHT VENTRICLE RV Basal diam:  3.90 cm RV S prime:     15.70 cm/s TAPSE (M-mode): 2.5 cm LEFT ATRIUM             Index        RIGHT ATRIUM           Index LA diam:        2.80 cm 1.45 cm/m   RA Area:     11.50 cm LA Vol (A2C):   42.0 ml 21.78 ml/m  RA Volume:   24.00 ml  12.45 ml/m LA Vol (A4C):   21.4 ml 11.10 ml/m LA Biplane Vol: 30.9 ml 16.03 ml/m  AORTIC VALVE                 PULMONIC VALVE AV Area (Vmax): 2.76 cm     PV Vmax:        1.06 m/s AV Vmax:        107.00  cm/s  PV Peak grad:   4.5 mmHg AV Peak Grad:   4.6 mmHg     RVOT Peak grad: 3 mmHg LVOT Vmax:      85.40 cm/s LVOT Vmean:     61.100 cm/s LVOT VTI:       0.188 m  AORTA Ao Root diam: 3.50 cm MITRAL VALVE               TRICUSPID VALVE MV Area (PHT): 3.70 cm    TR Peak grad:   27.0 mmHg MV Decel Time: 205 msec    TR Vmax:        260.00 cm/s MV E velocity: 55.50 cm/s MV A velocity: 66.60 cm/s  SHUNTS MV E/A ratio:  0.83        Systemic VTI:  0.19 m                            Systemic Diam: 2.10 cm Debbe Odea MD Electronically signed by Debbe Odea MD Signature Date/Time: 04/01/2023/3:10:22 PM    Final    CT CHEST ABDOMEN PELVIS W CONTRAST  Result Date: 03/30/2023 CLINICAL DATA:  Shortness of breath, sepsis after prostate biopsy ester day EXAM: CT CHEST, ABDOMEN, AND PELVIS WITH CONTRAST TECHNIQUE: Multidetector CT imaging of the chest, abdomen and pelvis was performed following the standard protocol during bolus administration of intravenous contrast. RADIATION DOSE REDUCTION: This exam was performed according to the departmental dose-optimization program which includes automated exposure control, adjustment of the mA and/or kV according to patient size and/or use of iterative reconstruction technique. CONTRAST:  OMNIPAQUE IOHEXOL 300 MG/ML  SOLN COMPARISON:  Chest x-ray 03/30/2023, CT 01/14/2023, 03/31/2021, CT cervical spine 04/07/2022, CT abdomen pelvis 03/10/2020 FINDINGS: CT CHEST FINDINGS Cardiovascular: Nonaneurysmal aorta. Mild atherosclerosis. Normal cardiac size. No pericardial effusion Mediastinum/Nodes: Midline trachea. No thyroid mass. No suspicious mediastinal lymph nodes. Esophagus within normal limits. Lungs/Pleura: Lungs are clear. No pleural effusion or pneumothorax. Musculoskeletal: Sternum appears intact. No acute osseous abnormality. Heterogenous appearance of C7, T1 and T2  on reconstructed images, sagittal series 6, image 64 but not really confirmed on corresponding axial  views. CT ABDOMEN PELVIS FINDINGS Hepatobiliary: No focal liver abnormality is seen. No gallstones, gallbladder wall thickening, or biliary dilatation. Hepatic steatosis. Pancreas: Unremarkable. No pancreatic ductal dilatation or surrounding inflammatory changes. Spleen: Normal in size without focal abnormality. Adrenals/Urinary Tract: Right adrenal gland is normal. Stable left adrenal nodule consistent with adenoma, no imaging follow-up is recommended. Kidneys are normal, without renal calculi, focal lesion, or hydronephrosis. Bladder is thick walled. Stomach/Bowel: Stomach is within normal limits. Appendix appears normal. No evidence of bowel wall thickening, distention, or inflammatory changes. Vascular/Lymphatic: Mild aortic atherosclerosis. No aneurysm. No suspicious lymph nodes. Reproductive: Obscured by artifact. Peri prostatic haziness suspected on coronal series 5, image 69. Prostate is enlarged. Other: Negative for pelvic effusion or free air. Small fat containing umbilical hernia. Musculoskeletal: No acute osseous abnormality. Right hip replacement. IMPRESSION: 1. Negative for acute airspace disease. 2. Enlarged prostate. Suspect haziness at the level of prostate but slightly limited due to streak artifact from right hip hardware. Question post biopsy change versus prostatitis. 3. Thick-walled bladder, cystitis versus chronic obstruction 4. Heterogenous appearance of C7, T1 and T2 on reconstructed images, not confirmed on corresponding axial views, this could be artifactual but recommend dedicated cervical CT for further assessment. 5. Aortic atherosclerosis. Aortic Atherosclerosis (ICD10-I70.0). Electronically Signed   By: Jasmine Pang M.D.   On: 03/30/2023 22:38   DG Chest Port 1 View  Result Date: 03/30/2023 CLINICAL DATA:  Status post prostate surgery 2 days ago, presenting with fever and weakness. EXAM: PORTABLE CHEST 1 VIEW COMPARISON:  September 18, 2022 FINDINGS: The heart size and mediastinal  contours are within normal limits. There is no evidence of an acute infiltrate, pleural effusion or pneumothorax. A right shoulder replacement is seen. Multilevel degenerative changes are noted throughout the thoracic spine. IMPRESSION: No active cardiopulmonary disease. Electronically Signed   By: Aram Candela M.D.   On: 03/30/2023 21:48    Microbiology: Recent Results (from the past 240 hour(s))  Blood Culture (routine x 2)     Status: Abnormal   Collection Time: 03/30/23  8:18 PM   Specimen: BLOOD  Result Value Ref Range Status   Specimen Description   Final    BLOOD Performed at [facility], [address], [place], [place] [address]    Special Requests   Final    BOTTLES DRAWN AEROBIC AND ANAEROBIC Blood Culture adequate volume Performed at [facility], [address] [place], [place] [address]    Culture  Setup Time   Final    GRAM NEGATIVE RODS IN BOTH AEROBIC AND ANAEROBIC BOTTLES CRITICAL RESULT CALLED TO, READ BACK BY AND VERIFIED WITH: MADISON HUNT AT 0906 03/31/23.PMF Performed at [facility], [address], [place], [place] [address]    Culture ESCHERICHIA COLI (A)  Final   Report Status 04/02/2023 FINAL  Final   Organism ID, Bacteria ESCHERICHIA COLI  Final      Susceptibility   Escherichia coli - MIC*    AMPICILLIN >=32 RESISTANT Resistant     CEFEPIME <=0.12 SENSITIVE Sensitive     CEFTAZIDIME <=1 SENSITIVE Sensitive     CEFTRIAXONE <=0.25 SENSITIVE Sensitive     CIPROFLOXACIN 0.5 INTERMEDIATE Intermediate     GENTAMICIN <=1 SENSITIVE Sensitive     IMIPENEM <=0.25 SENSITIVE Sensitive     TRIMETH/SULFA >=320 RESISTANT Resistant     AMPICILLIN/SULBACTAM >=32 RESISTANT Resistant     PIP/TAZO <=4  SENSITIVE Sensitive     * ESCHERICHIA COLI  Blood Culture ID Panel (Reflexed)     Status: Abnormal   Collection Time: 03/30/23  8:18 PM  Result Value Ref Range Status   Enterococcus faecalis NOT DETECTED NOT DETECTED Final    Enterococcus Faecium NOT DETECTED NOT DETECTED Final   Listeria monocytogenes NOT DETECTED NOT DETECTED Final   Staphylococcus species NOT DETECTED NOT DETECTED Final   Staphylococcus aureus (BCID) NOT DETECTED NOT DETECTED Final   Staphylococcus epidermidis NOT DETECTED NOT DETECTED Final   Staphylococcus lugdunensis NOT DETECTED NOT DETECTED Final   Streptococcus species NOT DETECTED NOT DETECTED Final   Streptococcus agalactiae NOT DETECTED NOT DETECTED Final   Streptococcus pneumoniae NOT DETECTED NOT DETECTED Final   Streptococcus pyogenes NOT DETECTED NOT DETECTED Final   A.calcoaceticus-baumannii NOT DETECTED NOT DETECTED Final   Bacteroides fragilis NOT DETECTED NOT DETECTED Final   Enterobacterales DETECTED (A) NOT DETECTED Final    Comment: Enterobacterales represent a large order of gram negative bacteria, not a single organism. CRITICAL RESULT CALLED TO, READ BACK BY AND VERIFIED WITH: MADISON HUNT AT 0906 03/31/23.PMF    Enterobacter cloacae complex NOT DETECTED NOT DETECTED Final   Escherichia coli DETECTED (A) NOT DETECTED Final    Comment: CRITICAL RESULT CALLED TO, READ BACK BY AND VERIFIED WITH: MADISON HUNT AT 0906 03/31/23.PMF    Klebsiella aerogenes NOT DETECTED NOT DETECTED Final   Klebsiella oxytoca NOT DETECTED NOT DETECTED Final   Klebsiella pneumoniae NOT DETECTED NOT DETECTED Final   Proteus species NOT DETECTED NOT DETECTED Final   Salmonella species NOT DETECTED NOT DETECTED Final   Serratia marcescens NOT DETECTED NOT DETECTED Final   Haemophilus influenzae NOT DETECTED NOT DETECTED Final   Neisseria meningitidis NOT DETECTED NOT DETECTED Final   Pseudomonas aeruginosa NOT DETECTED NOT DETECTED Final   Stenotrophomonas maltophilia NOT DETECTED NOT DETECTED Final   Candida albicans NOT DETECTED NOT DETECTED Final   Candida auris NOT DETECTED NOT DETECTED Final   Candida glabrata NOT DETECTED NOT DETECTED Final   Candida krusei NOT DETECTED NOT DETECTED  Final   Candida parapsilosis NOT DETECTED NOT DETECTED Final   Candida tropicalis NOT DETECTED NOT DETECTED Final   Cryptococcus neoformans/gattii NOT DETECTED NOT DETECTED Final   CTX-M ESBL NOT DETECTED NOT DETECTED Final   Carbapenem resistance IMP NOT DETECTED NOT DETECTED Final   Carbapenem resistance KPC NOT DETECTED NOT DETECTED Final   Carbapenem resistance NDM NOT DETECTED NOT DETECTED Final   Carbapenem resist OXA 48 LIKE NOT DETECTED NOT DETECTED Final   Carbapenem resistance VIM NOT DETECTED NOT DETECTED Final    Comment: Performed at [facility], [address] [place], [place] [address]  Blood Culture (routine x 2)     Status: Abnormal   Collection Time: 03/30/23  8:22 PM   Specimen: BLOOD  Result Value Ref Range Status   Specimen Description   Final    BLOOD Performed at [facility], [address] [place], [place] [address]    Special Requests   Final    BOTTLES DRAWN AEROBIC AND ANAEROBIC Blood Culture results may not be optimal due to an excessive volume of blood received in culture bottles Performed at [facility], [address] [place], [place] [address]    Culture  Setup Time   Final    GRAM NEGATIVE RODS IN BOTH AEROBIC AND ANAEROBIC BOTTLES CRITICAL RESULT CALLED TO, READ BACK BY AND VERIFIED WITH: MADISON HUNT AT 0906 03/31/23.PMF  Performed at [facility], [address] [place], [place] [address]    Culture (A)  Final    ESCHERICHIA COLI SUSCEPTIBILITIES PERFORMED ON PREVIOUS CULTURE WITHIN THE LAST 5 DAYS. Performed at [facility], [address], [place], [place] [address]    Report Status 04/02/2023 FINAL  Final  SARS Coronavirus 2 by RT PCR (hospital order, performed in [facility] hospital lab) *cepheid single result test* Anterior Nasal Swab     Status: None   Collection Time: 03/31/23  9:12 AM   Specimen: Anterior Nasal Swab  Result Value Ref Range Status   SARS Coronavirus 2 by RT PCR  NEGATIVE NEGATIVE Final    Comment: (NOTE) SARS-CoV-2 target nucleic acids are NOT DETECTED.  The SARS-CoV-2 RNA is generally detectable in upper and lower respiratory specimens during the acute phase of infection. The lowest concentration of SARS-CoV-2 viral copies this assay can detect is 250 copies / mL. A negative result does not preclude SARS-CoV-2 infection and should not be used as the sole basis for treatment or other patient management decisions.  A negative result may occur with improper specimen collection / handling, submission of specimen other than nasopharyngeal swab, presence of viral mutation(s) within the areas targeted by this assay, and inadequate number of viral copies (<250 copies / mL). A negative result must be combined with clinical observations, patient history, and epidemiological information.  Fact Sheet for Patients:   RoadLapTop.co.za  Fact Sheet for Healthcare Providers: http://kim-miller.com/  This test is not yet approved or  cleared by the [place] FDA and has been authorized for detection and/or diagnosis of SARS-CoV-2 by FDA under an Emergency Use Authorization (EUA).  This EUA will remain in effect (meaning this test can be used) for the duration of the COVID-19 declaration under Section 564(b)(1) of the Act, 21 U.S.C. section 360bbb-3(b)(1), unless the authorization is terminated or revoked sooner.  Performed at [facility], [address] [place], [place] [address]      Labs: CBC: Recent Labs  Lab 03/30/23 2020 03/31/23 0501 04/01/23 0439 04/02/23 0259  WBC 5.4 18.6* 14.7* 8.8  NEUTROABS 5.1  --   --   --   HGB 15.7 12.4* 13.1 12.3*  HCT 45.9 36.3* 37.8* 36.9*  MCV 91.4 93.3 91.7 93.7  PLT 201 162 161 158   Basic Metabolic Panel: Recent Labs  Lab 03/30/23 2020 03/31/23 0501 04/01/23 0439 04/02/23 0259  NA 137 139 136 138  K 3.5 3.6 3.8 4.0  CL 103 109 102 104   CO2 17* 23 25 26   GLUCOSE 128* 106* 129* 116*  BUN 20 15 12 11   CREATININE 1.00 1.08 0.94 0.90  CALCIUM 9.0 7.7* 8.0* 8.2*  MG  --  1.3* 1.6* 2.2  PHOS  --   --  2.2* 3.3   Liver Function Tests: Recent Labs  Lab 03/30/23 2020  AST 29  ALT 22  ALKPHOS 74  BILITOT 0.6  PROT 7.1  ALBUMIN 4.1   No results for input(s): "LIPASE", "AMYLASE" in the last 168 hours. No results for input(s): "AMMONIA" in the last 168 hours. Cardiac Enzymes: No results for input(s): "CKTOTAL", "CKMB", "CKMBINDEX", "TROPONINI" in the last 168 hours. BNP (last 3 results) No results for input(s): "BNP" in the last 8760 hours. CBG: Recent Labs  Lab 04/01/23 1232 04/01/23 1659 04/01/23 2116 04/02/23 0803 04/02/23 1241  GLUCAP 146* 134* 107* 94 129*    Time spent: 35 minutes  Signed:  Gillis Santa  [facility] 04/02/2023 2:30 PM

## 2023-04-02 NOTE — Progress Notes (Signed)
Nursing Discharge Note   Name: Joshua [name] MRN: [id number] DOB: 1961/02/10    Admit Date: 03/30/2023 Discharge Date: 04/02/2023   [name] is to be discharged home per MD order.  AVS completed. Reviewed with patient at bedside. Highlighted copy provided for patient to take home.  Patient able to verbalize understanding of discharge instructions. PIV removed. Patient stable upon discharge.   Discharge Instructions     Call MD for:  difficulty breathing, headache or visual disturbances   Complete by: As directed    Call MD for:  extreme fatigue   Complete by: As directed    Call MD for:  persistant dizziness or light-headedness   Complete by: As directed    Call MD for:  persistant nausea and vomiting   Complete by: As directed    Call MD for:  severe uncontrolled pain   Complete by: As directed    Call MD for:  temperature >100.4   Complete by: As directed    Diet - low sodium heart healthy   Complete by: As directed    Discharge instructions   Complete by: As directed    Follow-up with PCP in 1 week, monitor BP at home and resume home medications if blood pressure remains high. Follow-up with urologist in 1 week as an outpatient.   Increase activity slowly   Complete by: As directed        Allergies as of 04/02/2023   No Known Allergies      Medication List     STOP taking these medications    diazepam 2 MG tablet Commonly known as: VALIUM   ketoconazole 2 % cream Commonly known as: NIZORAL   predniSONE 10 MG (21) Tbpk tablet Commonly known as: STERAPRED UNI-PAK 21 TAB       TAKE these medications    acetaminophen 500 MG tablet Commonly known as: TYLENOL Take 1,000 mg by mouth every 6 (six) hours as needed for moderate pain or headache.   albuterol 108 (90 Base) MCG/ACT inhaler Commonly known as: VENTOLIN HFA Inhale 2 puffs into the lungs every 6 (six) hours as needed for wheezing or shortness of breath.   amLODipine 5 MG  tablet Commonly known as: NORVASC TAKE 1 TABLET (5 MG TOTAL) BY MOUTH DAILY. TO LOWER BLOOD PRESSURE. STOP LOSARTAN   aspirin 81 MG chewable tablet Chew 81 mg by mouth daily.   atorvastatin 20 MG tablet Commonly known as: LIPITOR TAKE 1 TABLET (20 MG TOTAL) BY MOUTH DAILY.   cefdinir 300 MG capsule Commonly known as: OMNICEF Take 1 capsule (300 mg total) by mouth 2 (two) times daily for 10 days.   clonazePAM 0.5 MG tablet Commonly known as: KLONOPIN TAKE 1 TABLET (0.5 MG TOTAL) BY MOUTH AT BEDTIME.   Lacosamide 100 MG Tabs TAKE 1 TABLET (100 MG TOTAL) BY MOUTH IN THE MORNING AND AT BEDTIME.   lamoTRIgine 200 MG tablet Commonly known as: LAMICTAL Take 1 tablet (200 mg total) by mouth 2 (two) times daily. What changed: Another medication with the same name was removed. Continue taking this medication, and follow the directions you see here.   lidocaine 5 % Commonly known as: Lidoderm Place 1 patch onto the skin daily. Remove & Discard patch within 12 hours or as directed by MD   meclizine 25 MG tablet Commonly known as: ANTIVERT Take 1 tablet (25 mg total) by mouth 3 (three) times daily as needed for dizziness.   metFORMIN 500 MG tablet Commonly known  as: GLUCOPHAGE Take 1 tablet (500 mg total) by mouth 2 (two) times daily.   methocarbamol 750 MG tablet Commonly known as: ROBAXIN Take 750 mg by mouth daily as needed for muscle spasms.   omeprazole 20 MG capsule Commonly known as: PRILOSEC Take 20 mg by mouth daily.   propranolol 10 MG tablet Commonly known as: INDERAL Take 1 tablet (10 mg total) by mouth 3 (three) times daily as needed (palpitations).   tadalafil 5 MG tablet Commonly known as: CIALIS Take 1 tablet (5 mg total) by mouth daily as needed for erectile dysfunction. 1 hour prior to sexual activity   tamsulosin 0.4 MG Caps capsule Commonly known as: FLOMAX Take 1 capsule (0.4 mg total) by mouth daily.   VITAMIN A PO Take 1 capsule by mouth daily.         Discharge Instructions/ Education: Discharge instructions given to patient with verbalized understanding. Discharge education completed with patient/family including: follow up instructions, medication list, discharge activities, and limitations if indicated. Patient instructed to return to Emergency Department, call 911, or call MD for any changes in condition.  Patient escorted via wheelchair to lobby and discharged home via private automobile.

## 2023-04-02 NOTE — Progress Notes (Signed)
[facility] Progress Note  Patient: Joshua [name]    VQQ:[id number]  DOA: 03/30/2023     Date of Service: the patient was seen and examined on 04/02/2023  Chief Complaint  Patient presents with   Fever   Brief hospital course: Joshua [name] is a 62 y.o. male with medical history significant for Malignant meningioma s/p resection 2014 and 2020, seizures, HTN, anxiety, palpitations, diabetes type 2, BPH s/p prostate biopsy on 9/11 presented for worsening pain since the procedure, hematuria and fever.  In the ED patient had fever of 103.1, tachycardic with pulse rate 157, BP 87/62, lactic acid 6.5, troponin bump.  Urinalysis not reported due to urine pigment.  CT abdomen pelvis showed enlarged prostate findings suspicious for prostatitis postbiopsy.  Patient is admitted to the hospitalist service for severe sepsis due to prostatitis.   Assessment and Plan: Severe sepsis due to prostatitis, E. coli bacteremia S/p prostate biopsy 03/29/23 Hematuria, resolved.  Sepsis criteria include fever, tachycardia, tachypnea, lactic acidosis, hypotension UA could not be interpreted due to presence of pigment Suspecting acute infection related to recent prostate biopsy S/p sepsis fluids, VS stable now Cefepime changed to Rocephin per pharmacy protocol, blood culture growing E. coli, sensitive to cephalosporin, resistant to fluoroquinolone and Unasyn. Urology consult appreciated-bladder scan, antibiotic therapy for 14 days. Plan is to discharge him on cefdinir twice daily for 10 days tomorrow a.m.  Paroxysmal SVT (supraventricular tachycardia) History of palpitations Peak troponin 130, trending down Demand ischemia Had prior episodes of palpitations. Elevated troponin likely demand ischemia. TTE LVEF 55 to 60%, grade 1 diastolic dysfunction, no wall motion abnormality. Cardiology consulted, troponin slightly elevated does not meet criteria for non-STEMI, most likely due to sepsis, demand  ischemia.  TTE done, no cardiac workup recommended.   Hypomagnesemia, mag repleted. Hypophosphatemia, Phos repleted. Monitor electrolytes and replete as needed.   Essential hypertension Hold antihypertensive medications as his blood pressure is lower side. Propranolol and amlodipine will be resumed if blood pressure persistently high.   High anion gap metabolic acidosis. Resolved  Secondary to lactic acidosis. Resolved s/p IVF resuscitation Metabolic acidosis resolved, anion gap 9 within normal range Monitor BMP daily   DM (diabetes mellitus) (HCC) Sliding scale insulin coverage. A1c 6.3 three months ago.   History of recurrent meningioma s/p resection x2 Stable.    Seizures (HCC) Continue Vimpat and Lamictal Ativan as needed seizures.   Body mass index is 25.1 kg/m.  Interventions:  Diet: Heart healthy/carb modified diet DVT Prophylaxis: SCD, pharmacological prophylaxis contraindicated due to hematuria    Advance goals of care discussion: Full code  Family Communication: family was not present at bedside, at the time of interview.  The pt provided permission to discuss medical plan with the family. Opportunity was given to ask question and all questions were answered satisfactorily.   Disposition:  Pt is from Home, admitted with sepsis, E. coli bacteremia, still on IV antibiotics, which precludes a safe discharge. Discharge to Home, when stable, most likely tomorrow a.m.  Subjective: No significant events overnight, patient denies any abdominal pain, patient was complaining of some chest congestion and cough with phlegm, had 1 BM today.  Denied any chest pain or palpitation, no shortness of breath.  No fever or chills.   Physical Exam: General: NAD, lying comfortably Appear in no distress, affect appropriate Eyes: PERRLA ENT: Oral Mucosa Clear, moist  Neck: no JVD,  Cardiovascular: S1 and S2 Present, no Murmur,  Respiratory: good respiratory effort, Bilateral  Air entry equal  and Decreased, no Crackles, no wheezes Abdomen: Bowel Sound present, Soft and no tenderness,  Skin: no rashes Extremities: no Pedal edema, no calf tenderness Neurologic: without any new focal findings Gait not checked due to patient safety concerns  Vitals:   04/02/23 0031 04/02/23 0345 04/02/23 0802 04/02/23 1240  BP: 107/68 116/67 119/76 116/68  Pulse: 69 65 66 67  Resp: 16 17 18 16   Temp: 98.5 F (36.9 C) 98.2 F (36.8 C) 98.9 F (37.2 C) 98 F (36.7 C)  TempSrc: Oral Oral Oral Oral  SpO2: 98% 99% 97% 98%  Weight:      Height:        Intake/Output Summary (Last 24 hours) at 04/02/2023 1414 Last data filed at 04/02/2023 0900 Gross per 24 hour  Intake 454.73 ml  Output 900 ml  Net -445.27 ml   Filed Weights   03/30/23 1955  Weight: 77.1 kg    Data Reviewed: I have personally reviewed and interpreted daily labs, tele strips, imagings as discussed above. I reviewed all nursing notes, pharmacy notes, vitals, pertinent old records I have discussed plan of care as described above with RN and patient/family.  CBC: Recent Labs  Lab 03/30/23 2020 03/31/23 0501 04/01/23 0439 04/02/23 0259  WBC 5.4 18.6* 14.7* 8.8  NEUTROABS 5.1  --   --   --   HGB 15.7 12.4* 13.1 12.3*  HCT 45.9 36.3* 37.8* 36.9*  MCV 91.4 93.3 91.7 93.7  PLT 201 162 161 158   Basic Metabolic Panel: Recent Labs  Lab 03/30/23 2020 03/31/23 0501 04/01/23 0439 04/02/23 0259  NA 137 139 136 138  K 3.5 3.6 3.8 4.0  CL 103 109 102 104  CO2 17* 23 25 26   GLUCOSE 128* 106* 129* 116*  BUN 20 15 12 11   CREATININE 1.00 1.08 0.94 0.90  CALCIUM 9.0 7.7* 8.0* 8.2*  MG  --  1.3* 1.6* 2.2  PHOS  --   --  2.2* 3.3    Studies: ECHOCARDIOGRAM COMPLETE  Result Date: 04/01/2023    ECHOCARDIOGRAM REPORT   Patient Name:   Joshua [name] Date of Exam: 04/01/2023 Medical Rec #:  [id number]         Height:       69.0 in Accession #:    [id number]        Weight:       170.0 lb Date of Birth:   1960-10-25          BSA:          1.928 m Patient Age:    62 years          BP:           101/40 mmHg Patient Gender: M                 HR:           67 bpm. Exam Location:  [facility] Procedure: 2D Echo and Strain Analysis Indications:     Elevated Troponin  History:         Patient has prior history of Echocardiogram examinations, most                  recent 11/24/2022.  Sonographer:     Overton Mam RDCS, FASE Referring Phys:  [id number] Andris Baumann Diagnosing Phys: Debbe Odea MD  Sonographer Comments: Technically difficult study due to poor echo windows. Global longitudinal strain was attempted. Challenging study due to lung interference. IMPRESSIONS  1. Left  ventricular ejection fraction, by estimation, is 55 to 60%. Left ventricular ejection fraction by PLAX is 57 %. The left ventricle has normal function. The left ventricle has no regional wall motion abnormalities. There is mild left ventricular hypertrophy. Left ventricular diastolic parameters are consistent with Grade I diastolic dysfunction (impaired relaxation). The average left ventricular global longitudinal strain is -17.0 %. The global longitudinal strain is normal.  2. Right ventricular systolic function is normal. The right ventricular size is normal. There is normal pulmonary artery systolic pressure.  3. The mitral valve is normal in structure. Mild mitral valve regurgitation.  4. The aortic valve is tricuspid. Aortic valve regurgitation is not visualized.  5. The inferior vena cava is dilated in size with >50% respiratory variability, suggesting right atrial pressure of 8 mmHg. FINDINGS  Left Ventricle: Left ventricular ejection fraction, by estimation, is 55 to 60%. Left ventricular ejection fraction by PLAX is 57 %. The left ventricle has normal function. The left ventricle has no regional wall motion abnormalities. The average left ventricular global longitudinal strain is -17.0 %. The global longitudinal strain is normal. The left  ventricular internal cavity size was normal in size. There is mild left ventricular hypertrophy. Left ventricular diastolic parameters are consistent with Grade I diastolic dysfunction (impaired relaxation). Right Ventricle: The right ventricular size is normal. No increase in right ventricular wall thickness. Right ventricular systolic function is normal. There is normal pulmonary artery systolic pressure. The tricuspid regurgitant velocity is 2.60 m/s, and  with an assumed right atrial pressure of 8 mmHg, the estimated right ventricular systolic pressure is 35.0 mmHg. Left Atrium: Left atrial size was normal in size. Right Atrium: Right atrial size was normal in size. Pericardium: There is no evidence of pericardial effusion. Mitral Valve: The mitral valve is normal in structure. Mild mitral valve regurgitation. Tricuspid Valve: The tricuspid valve is normal in structure. Tricuspid valve regurgitation is not demonstrated. Aortic Valve: The aortic valve is tricuspid. Aortic valve regurgitation is not visualized. Aortic valve peak gradient measures 4.6 mmHg. Pulmonic Valve: The pulmonic valve was not well visualized. Pulmonic valve regurgitation is not visualized. Aorta: The aortic root is normal in size and structure. Venous: The inferior vena cava is dilated in size with greater than 50% respiratory variability, suggesting right atrial pressure of 8 mmHg. IAS/Shunts: No atrial level shunt detected by color flow Doppler.  LEFT VENTRICLE PLAX 2D LV EF:         Left            Diastology                ventricular     LV e' medial:    8.92 cm/s                ejection        LV E/e' medial:  6.2                fraction by     LV e' lateral:   13.10 cm/s                PLAX is 57      LV E/e' lateral: 4.2                %. LVIDd:         4.40 cm         2D LVIDs:         3.10 cm  Longitudinal LV PW:         1.20 cm         Strain LV IVS:        1.20 cm         2D Strain GLS  -17.0 % LVOT diam:     2.10 cm          Avg: LV SV:         65 LV SV Index:   34 LVOT Area:     3.46 cm  RIGHT VENTRICLE RV Basal diam:  3.90 cm RV S prime:     15.70 cm/s TAPSE (M-mode): 2.5 cm LEFT ATRIUM             Index        RIGHT ATRIUM           Index LA diam:        2.80 cm 1.45 cm/m   RA Area:     11.50 cm LA Vol (A2C):   42.0 ml 21.78 ml/m  RA Volume:   24.00 ml  12.45 ml/m LA Vol (A4C):   21.4 ml 11.10 ml/m LA Biplane Vol: 30.9 ml 16.03 ml/m  AORTIC VALVE                 PULMONIC VALVE AV Area (Vmax): 2.76 cm     PV Vmax:        1.06 m/s AV Vmax:        107.00 cm/s  PV Peak grad:   4.5 mmHg AV Peak Grad:   4.6 mmHg     RVOT Peak grad: 3 mmHg LVOT Vmax:      85.40 cm/s LVOT Vmean:     61.100 cm/s LVOT VTI:       0.188 m  AORTA Ao Root diam: 3.50 cm MITRAL VALVE               TRICUSPID VALVE MV Area (PHT): 3.70 cm    TR Peak grad:   27.0 mmHg MV Decel Time: 205 msec    TR Vmax:        260.00 cm/s MV E velocity: 55.50 cm/s MV A velocity: 66.60 cm/s  SHUNTS MV E/A ratio:  0.83        Systemic VTI:  0.19 m                            Systemic Diam: 2.10 cm Debbe Odea MD Electronically signed by Debbe Odea MD Signature Date/Time: 04/01/2023/3:10:22 PM    Final     Scheduled Meds:  atorvastatin  20 mg Oral Daily   clonazePAM  0.5 mg Oral QHS   insulin aspart  0-15 Units Subcutaneous TID WC   insulin aspart  0-5 Units Subcutaneous QHS   lacosamide  100 mg Oral BID   lamoTRIgine  200 mg Oral BID   tamsulosin  0.4 mg Oral Daily   Continuous Infusions:  sodium chloride 10 mL/hr at 04/02/23 1304   cefTRIAXone (ROCEPHIN)  IV 2 g (04/02/23 1313)   PRN Meds: sodium chloride, acetaminophen **OR** acetaminophen, albuterol, HYDROcodone-acetaminophen, LORazepam, methocarbamol, ondansetron **OR** ondansetron (ZOFRAN) IV  Time spent: 35 minutes  Author: Gillis Santa. MD [facility] 04/02/2023 2:14 PM  To reach On-call, see care teams to locate the attending and reach out to them via [email or web address]. If 7PM-7AM,  please contact night-coverage If you still have difficulty reaching the attending provider, please page the [facility] (  Director on Call) for [facility] on [employer] for assistance.

## 2023-04-03 ENCOUNTER — Other Ambulatory Visit: Payer: Self-pay | Admitting: Internal Medicine

## 2023-04-03 ENCOUNTER — Ambulatory Visit: Payer: 59 | Admitting: Surgical

## 2023-04-10 ENCOUNTER — Ambulatory Visit: Payer: 59 | Admitting: Anesthesiology

## 2023-04-10 ENCOUNTER — Encounter: Payer: Self-pay | Admitting: Gastroenterology

## 2023-04-10 ENCOUNTER — Ambulatory Visit
Admission: RE | Admit: 2023-04-10 | Discharge: 2023-04-10 | Disposition: A | Discharge status: Home / Self Care | Payer: 59 | Attending: Gastroenterology | Admitting: Gastroenterology

## 2023-04-10 ENCOUNTER — Encounter
Admission: RE | Disposition: A | Discharge status: Home / Self Care | Payer: Self-pay | Source: Home / Self Care | Attending: Gastroenterology

## 2023-04-10 ENCOUNTER — Other Ambulatory Visit: Payer: Self-pay

## 2023-04-10 DIAGNOSIS — Z87891 Personal history of nicotine dependence: Secondary | ICD-10-CM | POA: Diagnosis not present

## 2023-04-10 DIAGNOSIS — K219 Gastro-esophageal reflux disease without esophagitis: Secondary | ICD-10-CM | POA: Insufficient documentation

## 2023-04-10 DIAGNOSIS — Z1211 Encounter for screening for malignant neoplasm of colon: Secondary | ICD-10-CM | POA: Insufficient documentation

## 2023-04-10 DIAGNOSIS — R569 Unspecified convulsions: Secondary | ICD-10-CM | POA: Insufficient documentation

## 2023-04-10 DIAGNOSIS — Z7984 Long term (current) use of oral hypoglycemic drugs: Secondary | ICD-10-CM | POA: Insufficient documentation

## 2023-04-10 DIAGNOSIS — R7303 Prediabetes: Secondary | ICD-10-CM | POA: Insufficient documentation

## 2023-04-10 DIAGNOSIS — Z86010 Personal history of colonic polyps: Secondary | ICD-10-CM | POA: Diagnosis not present

## 2023-04-10 DIAGNOSIS — E7800 Pure hypercholesterolemia, unspecified: Secondary | ICD-10-CM | POA: Insufficient documentation

## 2023-04-10 DIAGNOSIS — F419 Anxiety disorder, unspecified: Secondary | ICD-10-CM | POA: Diagnosis not present

## 2023-04-10 DIAGNOSIS — R000 Tachycardia, unspecified: Secondary | ICD-10-CM | POA: Diagnosis not present

## 2023-04-10 DIAGNOSIS — I4510 Unspecified right bundle-branch block: Secondary | ICD-10-CM | POA: Insufficient documentation

## 2023-04-10 DIAGNOSIS — Z833 Family history of diabetes mellitus: Secondary | ICD-10-CM | POA: Insufficient documentation

## 2023-04-10 DIAGNOSIS — I10 Essential (primary) hypertension: Secondary | ICD-10-CM | POA: Insufficient documentation

## 2023-04-10 HISTORY — PX: COLONOSCOPY WITH PROPOFOL: SHX5780

## 2023-04-10 LAB — GLUCOSE, CAPILLARY: Glucose-Capillary: 88 mg/dL (ref 70–99)

## 2023-04-10 SURGERY — COLONOSCOPY WITH PROPOFOL
Anesthesia: General

## 2023-04-10 MED ORDER — LIDOCAINE HCL (CARDIAC) PF 100 MG/5ML IV SOSY
PREFILLED_SYRINGE | INTRAVENOUS | Status: DC | PRN
  Administered 2023-04-10 (×2): 100 mg via INTRAVENOUS

## 2023-04-10 MED ORDER — PROPOFOL 10 MG/ML IV BOLUS
INTRAVENOUS | Status: DC | PRN
  Administered 2023-04-10: 50 mg via INTRAVENOUS
  Administered 2023-04-10: 100 ug/kg/min via INTRAVENOUS

## 2023-04-10 MED ORDER — SODIUM CHLORIDE 0.9 % IV SOLN: INTRAVENOUS | Status: DC

## 2023-04-10 NOTE — H&P (Signed)
Arlyss Repress, MD [address]  [address]  [place], [place] [address]  Main: [phone]  Fax: [phone] Pager: [phone]  Primary Care Physician:  Alease Medina, MD Primary Gastroenterologist:  Dr. Arlyss Repress  Pre-Procedure History & Physical: HPI:  Joshua [name] is a 62 y.o. male is here for a colonoscopy.   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 OR;  Service: Neurosurgery;  Laterality: N/A;   CATARACT EXTRACTION Right    CRANIOTOMY Right 11/06/2012   Procedure: CRANIOTOMY TUMOR EXCISION;  Surgeon: Maeola Harman, MD;  Location: [facility];  Service: Neurosurgery;  Laterality: Right;  Right Parasagittal craniotomy for meningioma with Stealth   CRANIOTOMY Right 01/10/2019   Procedure: Right Parasagittal Craniotomy for Tumor;  Surgeon: Maeola Harman, MD;  Location: [facility] OR;  Service: Neurosurgery;  Laterality: Right;  Right parasagittal craniotomy for tumor   ESOPHAGOGASTRODUODENOSCOPY     JOINT REPLACEMENT Right 2018   right hip   REVERSE SHOULDER ARTHROPLASTY Right 01/08/2021   Procedure: RIGHT REVERSE SHOULDER ARTHROPLASTY;  Surgeon: Cammy Copa, MD;  Location: [facility] OR;  Service: Orthopedics;  Laterality: Right;   right knee  arthroscopy     SHOULDER ARTHROSCOPY Right 06/09/2020   Procedure: RIGHT SHOULDER ARTHROSCOPY AND DEBRIDEMENT;  Surgeon: Nadara Mustard, MD;  Location: [facility];  Service: Orthopedics;  Laterality: Right;    Prior to Admission medications   Medication Sig Start Date End Date Taking? Authorizing Provider  acetaminophen (TYLENOL) 500 MG tablet Take 1,000 mg by mouth every 6 (six) hours as needed for moderate pain or headache.    [provider]  albuterol (VENTOLIN HFA) 108 (90 Base) MCG/ACT inhaler Inhale 2 puffs into the lungs every 6 (six) hours as needed for wheezing or shortness of breath. 08/13/21   Newlin, Enobong, MD  amLODipine (NORVASC) 5 MG tablet TAKE 1 TABLET (5 MG TOTAL) BY MOUTH DAILY. TO LOWER BLOOD PRESSURE. STOP LOSARTAN 08/13/21   Hoy Register, MD  aspirin 81 MG chewable tablet Chew 81 mg by mouth daily.    [provider]  atorvastatin (LIPITOR) 20 MG tablet TAKE 1 TABLET (20 MG TOTAL) BY MOUTH DAILY. 12/08/21   Hoy Register, MD  cefdinir (OMNICEF) 300 MG capsule Take 1 capsule (300 mg total) by mouth 2 (two) times daily for 10 days. 04/02/23 04/12/23  Gillis Santa, MD  clonazePAM (KLONOPIN) 0.5 MG tablet TAKE 1 TABLET (0.5 MG TOTAL) BY MOUTH AT BEDTIME. 12/08/22   Vaslow, Georgeanna Lea, MD  Lacosamide 100 MG TABS TAKE 1 TABLET (100 MG TOTAL) BY MOUTH IN THE MORNING AND AT BEDTIME. 04/03/23   Vaslow, Georgeanna Lea, MD  lamoTRIgine (LAMICTAL) 200 MG tablet Take 1 tablet (200 mg total) by mouth 2 (two) times daily. 02/20/23   Vaslow, Georgeanna Lea, MD  lidocaine (LIDODERM) 5 % Place 1 patch onto the skin daily. Remove & Discard patch within 12 hours or as directed by MD 04/07/22   Marita Kansas, PA-C  meclizine (ANTIVERT) 25 MG tablet Take 1 tablet (25 mg total) by mouth 3 (three) times daily as needed for dizziness. 01/04/22   Horton, Clabe Seal, DO  metFORMIN (GLUCOPHAGE) 500 MG tablet Take 1 tablet (500 mg total) by mouth 2 (two) times daily. 08/13/21   Hoy Register, MD  methocarbamol (ROBAXIN) 750 MG tablet Take 750 mg by mouth daily as needed for muscle spasms.    [provider]  omeprazole (PRILOSEC) 20 MG capsule Take 20 mg by mouth daily. 08/29/22   [provider]  propranolol (INDERAL) 10 MG tablet Take 1 tablet (10 mg total) by mouth 3 (three) times daily as needed (palpitations). 05/31/22   Swinyer, Zachary George, NP  tadalafil (CIALIS) 5 MG tablet Take 1 tablet (5 mg total) by mouth daily as needed for erectile dysfunction. 1 hour prior to sexual activity 01/17/22   Sondra Come, MD  tamsulosin (FLOMAX) 0.4 MG CAPS capsule Take 1 capsule (0.4 mg total) by mouth daily. 11/23/21   Michiel Cowboy A, PA-C  VITAMIN A PO Take 1 capsule by mouth daily.    [provider]    Allergies as of 02/17/2023   (No Known Allergies)    Family History  Problem Relation Age of Onset   Hypertension Mother    Dementia Mother    Diabetes Mother    Hypertension Father    CAD Father    Diabetes Father    CAD Brother     Social History   Socioeconomic History   Marital status: Legally Separated    Spouse name: Not on file   Number of children: 2   Years of education: Not on file   Highest education level: Not on file  Occupational History   Not on file  Tobacco Use   Smoking status: Former    Current packs/day: 0.00    Average packs/day: (0.2 ttl pk-yrs)    Types: Cigarettes    Start date: 01/29/2004    Quit date: 01/29/2019    Years since quitting: 4.1    Passive exposure: Past   Smokeless tobacco: Never   Tobacco comments:    weekend smoker  Vaping Use   Vaping status: Some Days   Substances: Nicotine, Flavoring  Substance and Sexual Activity   Alcohol use: Yes    Comment: social   Drug use: No   Sexual activity: Not Currently  Other Topics Concern   Not on file  Social History Narrative      Caffeine- soda occass   Social Determinants of Health   Financial Resource Strain: Not on file  Food  Insecurity: No Food Insecurity (03/31/2023)   Hunger Vital Sign    Worried About Running Out of Food in the Last Year: Never true    Ran Out of Food in the Last Year: Never true  Transportation Needs: No Transportation Needs (03/31/2023)   PRAPARE - Administrator, Civil Service (Medical): No    Lack of Transportation (Non-Medical): No  Physical Activity: Not on file  Stress: Not on file  Social Connections: Not on file  Intimate Partner Violence: Not At Risk (03/31/2023)   Humiliation, Afraid, Rape,  and Kick questionnaire    Fear of Current or Ex-Partner: No    Emotionally Abused: No    Physically Abused: No    Sexually Abused: No    Review of Systems: See HPI, otherwise negative ROS  Physical Exam: BP 130/78   Pulse (!) 59   Temp (!) 96.6 F (35.9 C) (Temporal)   Resp 16   Wt 82.1 kg   SpO2 99%   BMI 26.73 kg/m  General:   Alert,  pleasant and cooperative in NAD Head:  Normocephalic and atraumatic. Neck:  Supple; no masses or thyromegaly. Lungs:  Clear throughout to auscultation.    Heart:  Regular rate and rhythm. Abdomen:  Soft, nontender and nondistended. Normal bowel sounds, without guarding, and without rebound.   Neurologic:  Alert and  oriented x4;  grossly normal neurologically.  Impression/Plan: Joshua [name] is here for a colonoscopy to be performed for h/o colon polyps  Risks, benefits, limitations, and alternatives regarding  colonoscopy have been reviewed with the patient.  Questions have been answered.  All parties agreeable.   Lannette Donath, MD  04/10/2023, 9:08 AM

## 2023-04-10 NOTE — Transfer of Care (Signed)
Immediate Anesthesia Transfer of Care Note  Patient: ANSEN OCASIO  Procedure(s) Performed: COLONOSCOPY WITH PROPOFOL  Patient Location: PACU  Anesthesia Type:General  Level of Consciousness: drowsy and patient cooperative  Airway & Oxygen Therapy: Patient Spontanous Breathing  Post-op Assessment: Report given to RN and Post -op Vital signs reviewed and stable  Post vital signs: stable  Last Vitals:  Vitals Value Taken Time  BP    Temp    Pulse    Resp    SpO2      Last Pain:  Vitals:   04/10/23 0846  TempSrc: Temporal  PainSc: 0-No pain         Complications: No notable events documented.

## 2023-04-10 NOTE — Anesthesia Preprocedure Evaluation (Addendum)
Anesthesia Evaluation  Patient identified by MRN, date of birth, ID band Patient awake    Reviewed: Allergy & Precautions, NPO status , Patient's Chart, lab work & pertinent test results  History of Anesthesia Complications Negative for: history of anesthetic complications  Airway Mallampati: I   Neck ROM: Full    Dental  (+) Partial Upper   Pulmonary former smoker (quit 2020)   Pulmonary exam normal breath sounds clear to auscultation       Cardiovascular hypertension, Normal cardiovascular exam Rhythm:Regular Rate:Normal  ECG 03/30/23:  Sinus tachycardia Incomplete right bundle branch block Borderline ST elevation, lateral leads   Neuro/Psych Seizures -, Well Controlled,  PSYCHIATRIC DISORDERS Anxiety     Meningioma s/p resections x2    GI/Hepatic ,GERD  ,,  Endo/Other  Prediabetes   Renal/GU negative Renal ROS   Prostate CA    Musculoskeletal   Abdominal   Peds  Hematology negative hematology ROS (+)   Anesthesia Other Findings   Reproductive/Obstetrics                             Anesthesia Physical Anesthesia Plan  ASA: 3  Anesthesia Plan: General   Post-op Pain Management:    Induction: Intravenous  PONV Risk Score and Plan: 2 and Propofol infusion, TIVA and Treatment may vary due to age or medical condition  Airway Management Planned: Natural Airway  Additional Equipment:   Intra-op Plan:   Post-operative Plan:   Informed Consent: I have reviewed the patients History and Physical, chart, labs and discussed the procedure including the risks, benefits and alternatives for the proposed anesthesia with the patient or authorized representative who has indicated his/her understanding and acceptance.       Plan Discussed with: CRNA  Anesthesia Plan Comments: (LMA/GETA backup discussed.  Patient consented for risks of anesthesia including but not limited to:  -  adverse reactions to medications - damage to eyes, teeth, lips or other oral mucosa - nerve damage due to positioning  - sore throat or hoarseness - damage to heart, brain, nerves, lungs, other parts of body or loss of life  Informed patient about role of CRNA in peri- and intra-operative care.  Patient voiced understanding.)        Anesthesia Quick Evaluation

## 2023-04-10 NOTE — Op Note (Signed)
[facility] Patient Name: Joshua [name] Procedure Date: 04/10/2023 9:12 AM MRN: [id number] Account #: [id number] Date of Birth: 26-Jan-1961 Admit Type: Outpatient Age: 62 Room: [facility] ENDO ROOM 4 Gender: Male Note Status: Finalized Instrument Name: Prentice Docker [id number] Procedure:             Colonoscopy Indications:           Surveillance: Personal history of adenomatous polyps                         on last colonoscopy 3 years ago Providers:             Toney Reil MD, MD Medicines:             General Anesthesia Complications:         No immediate complications. Estimated blood loss: None. Procedure:             Pre-Anesthesia Assessment:                        - Prior to the procedure, a History and Physical was                         performed, and patient medications and allergies were                         reviewed. The patient is competent. The risks and                         benefits of the procedure and the sedation options and                         risks were discussed with the patient. All questions                         were answered and informed consent was obtained.                         Patient identification and proposed procedure were                         verified by the physician, the nurse, the                         anesthesiologist, the anesthetist and the technician                         in the pre-procedure area in the procedure room in the                         endoscopy suite. Mental Status Examination: alert and                         oriented. Airway Examination: normal oropharyngeal                         airway and neck mobility. Respiratory Examination:                         clear to  auscultation. CV Examination: normal.                         Prophylactic Antibiotics: The patient does not require                         prophylactic antibiotics. Prior Anticoagulants: The                          patient has taken no anticoagulant or antiplatelet                         agents. ASA Grade Assessment: II - A patient with mild                         systemic disease. After reviewing the risks and                         benefits, the patient was deemed in satisfactory                         condition to undergo the procedure. The anesthesia                         plan was to use general anesthesia. Immediately prior                         to administration of medications, the patient was                         re-assessed for adequacy to receive sedatives. The                         heart rate, respiratory rate, oxygen saturations,                         blood pressure, adequacy of pulmonary ventilation, and                         response to care were monitored throughout the                         procedure. The physical status of the patient was                         re-assessed after the procedure.                        After obtaining informed consent, the colonoscope was                         passed under direct vision. Throughout the procedure,                         the patient's blood pressure, pulse, and oxygen                         saturations were monitored continuously. The  Colonoscope was introduced through the anus and                         advanced to the the cecum, identified by appendiceal                         orifice and ileocecal valve. The colonoscopy was                         performed without difficulty. The patient tolerated                         the procedure well. The quality of the bowel                         preparation was evaluated using the BBPS [facility] Bowel                         Preparation Scale) with scores of: Right Colon = 3,                         Transverse Colon = 3 and Left Colon = 3 (entire mucosa                         seen well with no residual staining, small fragments                          of stool or opaque liquid). The total BBPS score                         equals 9. The ileocecal valve, appendiceal orifice,                         and rectum were photographed. Findings:      The perianal and digital rectal examinations were normal. Pertinent       negatives include normal sphincter tone and no palpable rectal lesions.      The colon (entire examined portion) appeared normal.      The retroflexed view of the distal rectum and anal verge was normal and       showed no anal or rectal abnormalities. Impression:            - The entire examined colon is normal.                        - The distal rectum and anal verge are normal on                         retroflexion view.                        - No specimens collected. Recommendation:        - Discharge patient to home (with escort).                        - Resume previous diet today.                        -  Continue present medications.                        - Repeat colonoscopy in 10 years for screening                         purposes. Procedure Code(s):     --- Professional ---                        Z6109, Colorectal cancer screening; colonoscopy on                         individual at high risk Diagnosis Code(s):     --- Professional ---                        Z86.010, Personal history of colonic polyps CPT copyright 2022 American Medical Association. All rights reserved. The codes documented in this report are preliminary and upon coder review may  be revised to meet current compliance requirements. Dr. Libby Maw Toney Reil MD, MD 04/10/2023 9:30:12 AM This report has been signed electronically. Number of Addenda: 0 Note Initiated On: 04/10/2023 9:12 AM Scope Withdrawal Time: 0 hours 8 minutes 50 seconds  Total Procedure Duration: 0 hours 11 minutes 2 seconds  Estimated Blood Loss:  Estimated blood loss: none.      [facility]

## 2023-04-10 NOTE — Anesthesia Postprocedure Evaluation (Signed)
Anesthesia Post Note  Patient: Joshua [name]  Procedure(s) Performed: COLONOSCOPY WITH PROPOFOL  Patient location during evaluation: PACU Anesthesia Type: General Level of consciousness: awake and alert, oriented and patient cooperative Pain management: pain level controlled Vital Signs Assessment: post-procedure vital signs reviewed and stable Respiratory status: spontaneous breathing, nonlabored ventilation and respiratory function stable Cardiovascular status: blood pressure returned to baseline and stable Postop Assessment: adequate PO intake Anesthetic complications: no   There were no known notable events for this encounter.   Last Vitals:  Vitals:   04/10/23 0945 04/10/23 0955  BP: 112/81 118/71  Pulse: (!) 50 (!) 52  Resp: 14 17  Temp:    SpO2: 100% 100%    Last Pain:  Vitals:   04/10/23 0955  TempSrc:   PainSc: 0-No pain                 Reed Breech

## 2023-04-11 ENCOUNTER — Encounter: Payer: Self-pay | Admitting: Gastroenterology

## 2023-04-12 ENCOUNTER — Ambulatory Visit: Payer: 59 | Admitting: Urology

## 2023-04-12 ENCOUNTER — Encounter: Payer: Self-pay | Admitting: Urology

## 2023-04-12 VITALS — BP 152/76 | HR 82 | Ht 69.0 in | Wt 181.0 lb

## 2023-04-12 DIAGNOSIS — C61 Malignant neoplasm of prostate: Secondary | ICD-10-CM

## 2023-04-12 NOTE — Progress Notes (Signed)
04/12/2023 11:31 AM   [name] [birth year] [id number]  Reason for visit: Follow up low risk prostate cancer, repeat biopsy results, postbiopsy sepsis  HPI: 62 year old male with a slow rise in PSA to 3.9 in April 2023 and he requested a prostate MRI.  Prostate MRI 03/10/2022 showed a 51 g prostate with a PI-RADS 4 lesion in the left posterior lateral peripheral zone, he underwent a cognitive biopsy in September 2023 which showed 4 cores of low risk Gleason score 3+3=6 prostate cancer with 60% max core involvement.  He opted for active surveillance.  PSA in May 2024 was 5.6, and he opted for an MRI fusion confirmatory biopsy.  He underwent MRI fusion biopsy on 03/29/2023, 3 biopsies were taken from the PI-RADS 4 ROI#1 as well as the standard 12 core biopsy.  The pathology from the ROI was benign, and a total of 6/12 standard cores showed low risk Gleason score 3+3=6 disease with max core involvement of 33%.  Unfortunately, he developed post biopsy sepsis and was admitted to [facility] and treated with antibiotics.  He reports he feels much better and denies any urinary complaints today.  Hospital notes reviewed.  We reviewed his pathology results with similar findings to his initial biopsy.  He would like to continue active surveillance for low risk disease, risk and benefits were discussed, as well as other alternatives including radical prostatectomy or radiation.  RTC 6 months PSA reflex to free prior, ongoing active surveillance for low risk prostate cancer  Sondra Come, MD  [facility] [address] [place], [place] [address] [phone]

## 2023-04-18 ENCOUNTER — Telehealth: Payer: Self-pay | Admitting: Internal Medicine

## 2023-04-18 DIAGNOSIS — F17210 Nicotine dependence, cigarettes, uncomplicated: Secondary | ICD-10-CM | POA: Diagnosis not present

## 2023-04-18 DIAGNOSIS — R5383 Other fatigue: Secondary | ICD-10-CM | POA: Diagnosis not present

## 2023-04-18 DIAGNOSIS — E7800 Pure hypercholesterolemia, unspecified: Secondary | ICD-10-CM | POA: Diagnosis not present

## 2023-04-18 DIAGNOSIS — Z0000 Encounter for general adult medical examination without abnormal findings: Secondary | ICD-10-CM | POA: Diagnosis not present

## 2023-04-18 DIAGNOSIS — Z1159 Encounter for screening for other viral diseases: Secondary | ICD-10-CM | POA: Diagnosis not present

## 2023-04-18 DIAGNOSIS — Z9181 History of falling: Secondary | ICD-10-CM | POA: Diagnosis not present

## 2023-04-18 DIAGNOSIS — E559 Vitamin D deficiency, unspecified: Secondary | ICD-10-CM | POA: Diagnosis not present

## 2023-04-18 DIAGNOSIS — R7303 Prediabetes: Secondary | ICD-10-CM | POA: Diagnosis not present

## 2023-04-18 DIAGNOSIS — Z760 Encounter for issue of repeat prescription: Secondary | ICD-10-CM | POA: Diagnosis not present

## 2023-04-18 DIAGNOSIS — R002 Palpitations: Secondary | ICD-10-CM | POA: Diagnosis not present

## 2023-04-18 NOTE — Telephone Encounter (Signed)
Rescheduled patient's appointment time, patient has been called and notified.

## 2023-04-19 DIAGNOSIS — M25652 Stiffness of left hip, not elsewhere classified: Secondary | ICD-10-CM | POA: Diagnosis not present

## 2023-04-19 DIAGNOSIS — M25662 Stiffness of left knee, not elsewhere classified: Secondary | ICD-10-CM | POA: Diagnosis not present

## 2023-04-19 DIAGNOSIS — M25552 Pain in left hip: Secondary | ICD-10-CM | POA: Diagnosis not present

## 2023-04-19 DIAGNOSIS — M6281 Muscle weakness (generalized): Secondary | ICD-10-CM | POA: Diagnosis not present

## 2023-04-27 ENCOUNTER — Ambulatory Visit: Payer: 59 | Admitting: Internal Medicine

## 2023-04-27 ENCOUNTER — Telehealth: Payer: Self-pay

## 2023-04-27 ENCOUNTER — Inpatient Hospital Stay: Payer: 59 | Attending: Internal Medicine | Admitting: Internal Medicine

## 2023-04-27 NOTE — Telephone Encounter (Signed)
Patient called in stating he over slept and will not be able to make his appointment with Dr. Barbaraann Cao. Will make doctor aware and will send a message to scheduling to get him a new appointment.

## 2023-05-02 DIAGNOSIS — I10 Essential (primary) hypertension: Secondary | ICD-10-CM | POA: Diagnosis not present

## 2023-05-02 DIAGNOSIS — F17210 Nicotine dependence, cigarettes, uncomplicated: Secondary | ICD-10-CM | POA: Diagnosis not present

## 2023-05-02 DIAGNOSIS — E7800 Pure hypercholesterolemia, unspecified: Secondary | ICD-10-CM | POA: Diagnosis not present

## 2023-05-02 DIAGNOSIS — R7303 Prediabetes: Secondary | ICD-10-CM | POA: Diagnosis not present

## 2023-05-02 DIAGNOSIS — M25512 Pain in left shoulder: Secondary | ICD-10-CM | POA: Diagnosis not present

## 2023-05-02 DIAGNOSIS — Z9181 History of falling: Secondary | ICD-10-CM | POA: Diagnosis not present

## 2023-05-04 ENCOUNTER — Ambulatory Visit: Payer: 59 | Admitting: Urology

## 2023-05-05 ENCOUNTER — Ambulatory Visit: Payer: 59 | Admitting: Surgical

## 2023-05-10 DIAGNOSIS — M25512 Pain in left shoulder: Secondary | ICD-10-CM | POA: Diagnosis not present

## 2023-05-10 DIAGNOSIS — Z9181 History of falling: Secondary | ICD-10-CM | POA: Diagnosis not present

## 2023-05-10 DIAGNOSIS — R7303 Prediabetes: Secondary | ICD-10-CM | POA: Diagnosis not present

## 2023-05-10 DIAGNOSIS — I10 Essential (primary) hypertension: Secondary | ICD-10-CM | POA: Diagnosis not present

## 2023-05-10 DIAGNOSIS — E7800 Pure hypercholesterolemia, unspecified: Secondary | ICD-10-CM | POA: Diagnosis not present

## 2023-05-10 DIAGNOSIS — F17210 Nicotine dependence, cigarettes, uncomplicated: Secondary | ICD-10-CM | POA: Diagnosis not present

## 2023-05-11 ENCOUNTER — Telehealth: Payer: Self-pay | Admitting: *Deleted

## 2023-05-11 ENCOUNTER — Inpatient Hospital Stay: Payer: 59 | Admitting: Internal Medicine

## 2023-05-11 NOTE — Telephone Encounter (Signed)
PC to patient, no answer, left VM - informed patient he missed his appointment this morning, our scheduling department will contact him to reschedule.  Scheduling message sent.

## 2023-05-22 ENCOUNTER — Inpatient Hospital Stay: Payer: 59 | Attending: Internal Medicine | Admitting: Internal Medicine

## 2023-05-22 VITALS — BP 149/74 | HR 91 | Temp 98.4°F | Resp 20 | Wt 187.0 lb

## 2023-05-22 DIAGNOSIS — R569 Unspecified convulsions: Secondary | ICD-10-CM | POA: Diagnosis not present

## 2023-05-22 DIAGNOSIS — C700 Malignant neoplasm of cerebral meninges: Secondary | ICD-10-CM

## 2023-05-22 DIAGNOSIS — C709 Malignant neoplasm of meninges, unspecified: Secondary | ICD-10-CM | POA: Insufficient documentation

## 2023-05-22 DIAGNOSIS — I6782 Cerebral ischemia: Secondary | ICD-10-CM | POA: Diagnosis not present

## 2023-05-22 NOTE — Progress Notes (Signed)
[facility] [address]  [place], [place] [address] [phone]   Interval Evaluation  Date of Service: 05/22/23 Patient Name: Joshua [name] Patient MRN: [id number] Patient DOB: 1961-01-04 Provider: Henreitta Leber, MD  Identifying Statement:  Joshua [name] is a 62 y.o. male with  parasaggital  meningioma and focal epilepsy  Oncologic History: 10/30/12: Craniotomy and resection of parasaggital meningioma by Dr. Venetia Maxon (WHO I) 01/10/19: Repeat craniotomy, debulking resection by Dr. Venetia Maxon after tumor recurrence, seizures.  Path demonstrates islands of anaplasia c/w grade II/III. 04/05/19: Completes post-operative IMRT with Dr. Mitzi Hansen  Interval History:  Joshua [name] presents today for follow up after recent MRI brain. No new or progressive changes today.  Remains seizure-free, maintains good comliance with Vimpat 50mg  BID and Lamictal 200mg  BID.  Klonopin he has been dosing 0.5mg  at night.  Remains physically active, engaged.  H+P (02/05/19) Patient presents to review clinical course and care for his meningioma.  Initially he presented in 2014 with headache syndrome, was found to have tumor on imaging which was resected by Dr. Venetia Maxon.  The patient was lost to follow up for ~5 years until he developed seizures, described as "twitching of left leg spreading to arm" this past month.  He required loads of Keppra and Dilantin to break events.  MRI demonstrated significant regrowth of the mass, and repeat craniotomy was performed on 01/10/19.  Small amount of residual tumor was visible on post-operative MRI.  Since surgery he has continued to experience numbness and clumsiness of his left leg, requiring cane for ambulation.  He is still pending home physical therapy evaluation.  He has had "small" seizures, consisting of few seconds of shaking of left leg, occurring less than daily.  Continues on Keppra and Dilantin.    Medications: Current  Outpatient Medications on File Prior to Visit  Medication Sig Dispense Refill   acetaminophen (TYLENOL) 500 MG tablet Take 1,000 mg by mouth every 6 (six) hours as needed for moderate pain or headache.     albuterol (VENTOLIN HFA) 108 (90 Base) MCG/ACT inhaler Inhale 2 puffs into the lungs every 6 (six) hours as needed for wheezing or shortness of breath. 8 g 2   amLODipine (NORVASC) 5 MG tablet TAKE 1 TABLET (5 MG TOTAL) BY MOUTH DAILY. TO LOWER BLOOD PRESSURE. STOP LOSARTAN 90 tablet 0   aspirin 81 MG chewable tablet Chew 81 mg by mouth daily.     atorvastatin (LIPITOR) 20 MG tablet TAKE 1 TABLET (20 MG TOTAL) BY MOUTH DAILY. 30 tablet 0   clonazePAM (KLONOPIN) 0.5 MG tablet TAKE 1 TABLET (0.5 MG TOTAL) BY MOUTH AT BEDTIME. 60 tablet 1   Lacosamide 100 MG TABS TAKE 1 TABLET (100 MG TOTAL) BY MOUTH IN THE MORNING AND AT BEDTIME. 60 tablet 3   lamoTRIgine (LAMICTAL) 200 MG tablet Take 1 tablet (200 mg total) by mouth 2 (two) times daily. 60 tablet 3   lidocaine (LIDODERM) 5 % Place 1 patch onto the skin daily. Remove & Discard patch within 12 hours or as directed by MD 14 patch 0   meclizine (ANTIVERT) 25 MG tablet Take 1 tablet (25 mg total) by mouth 3 (three) times daily as needed for dizziness. 30 tablet 0   metFORMIN (GLUCOPHAGE) 500 MG tablet Take 1 tablet (500 mg total) by mouth 2 (two) times daily. 180 tablet 1   methocarbamol (ROBAXIN) 750 MG tablet Take 750 mg by mouth daily as needed for muscle  spasms.     omeprazole (PRILOSEC) 20 MG capsule Take 20 mg by mouth daily.     propranolol (INDERAL) 10 MG tablet Take 1 tablet (10 mg total) by mouth 3 (three) times daily as needed (palpitations). 60 tablet 11   tadalafil (CIALIS) 5 MG tablet Take 1 tablet (5 mg total) by mouth daily as needed for erectile dysfunction. 1 hour prior to sexual activity 30 tablet 11   tamsulosin (FLOMAX) 0.4 MG CAPS capsule Take 1 capsule (0.4 mg total) by mouth daily. 90 capsule 3   VITAMIN A PO Take 1 capsule by  mouth daily.     No current facility-administered medications on file prior to visit.    Allergies: No Known Allergies Past Medical History:  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   Meningioma (HCC)    Meningioma, recurrent of brain (HCC) 01/31/2019   Neuropathy 12/05/2019   Pre-diabetes    Rotator cuff tear    right   Seizures (HCC)    11/27/19 last sz 1 wk ago   Past Surgical History:  Past Surgical History:  Procedure Laterality Date   APPLICATION OF CRANIAL NAVIGATION N/A 01/10/2019   Procedure: APPLICATION OF CRANIAL NAVIGATION;  Surgeon: Maeola Harman, MD;  Location: [facility] OR;  Service: Neurosurgery;  Laterality: N/A;   CATARACT EXTRACTION Right    COLONOSCOPY WITH PROPOFOL N/A 04/10/2023   Procedure: COLONOSCOPY WITH PROPOFOL;  Surgeon: Toney Reil, MD;  Location[facility] ENDOSCOPY;  Service: Gastroenterology;  Laterality: N/A;   CRANIOTOMY Right 11/06/2012   Procedure: CRANIOTOMY TUMOR EXCISION;  Surgeon: Maeola Harman, MD;  Location: [facility] NEURO ORS;  Service: Neurosurgery;  Laterality: Right;  Right Parasagittal craniotomy for meningioma with Stealth   CRANIOTOMY Right 01/10/2019   Procedure: Right Parasagittal Craniotomy for Tumor;  Surgeon: Maeola Harman, MD;  Location: [facility] OR;  Service: Neurosurgery;  Laterality: Right;  Right parasagittal craniotomy for tumor   ESOPHAGOGASTRODUODENOSCOPY     JOINT REPLACEMENT Right 2018   right hip   REVERSE SHOULDER ARTHROPLASTY Right 01/08/2021   Procedure: RIGHT REVERSE SHOULDER ARTHROPLASTY;  Surgeon: Cammy Copa, MD;  Location: [facility] OR;  Service: Orthopedics;  Laterality: Right;    right knee arthroscopy     SHOULDER ARTHROSCOPY Right 06/09/2020   Procedure: RIGHT SHOULDER ARTHROSCOPY AND DEBRIDEMENT;  Surgeon: Nadara Mustard, MD;  Location: [facility];  Service: Orthopedics;  Laterality: Right;   Social History:  Social History   Socioeconomic History   Marital status: Legally Separated    Spouse name: Not on file   Number of children: 2   Years of education: Not on file   Highest education level: Not on file  Occupational History   Not on file  Tobacco Use   Smoking status: Former    Current packs/day: 0.00    Average packs/day: (0.2 ttl pk-yrs)    Types: Cigarettes    Start date: 01/29/2004    Quit date: 01/29/2019    Years since quitting: 4.3    Passive exposure: Past   Smokeless tobacco: Never   Tobacco comments:    weekend smoker  Vaping Use   Vaping status: Some Days   Substances: Nicotine, Flavoring  Substance and Sexual Activity   Alcohol use: Yes    Comment: social   Drug use: No   Sexual activity: Not Currently  Other Topics Concern   Not on file  Social History Narrative      Caffeine- soda occass   Social Determinants of Health   Financial Resource Strain: Not on file  Food Insecurity: No Food Insecurity (03/31/2023)   Hunger Vital Sign    Worried About Running Out of Food in the Last Year: Never true    Ran Out of Food in the Last Year: Never true  Transportation Needs: No Transportation Needs (03/31/2023)   PRAPARE - Administrator, Civil Service (Medical): No    Lack of Transportation (Non-Medical): No  Physical Activity: Not on file  Stress: Not on file  Social Connections: Not on file  Intimate Partner Violence: Not At Risk (03/31/2023)   Humiliation, Afraid, Rape, and Kick questionnaire    Fear of Current or Ex-Partner: No    Emotionally Abused: No    Physically Abused: No    Sexually Abused: No   Family History:  Family History  Problem Relation Age of Onset   Hypertension Mother     Dementia Mother    Diabetes Mother    Hypertension Father    CAD Father    Diabetes Father    CAD Brother     Review of Systems: Constitutional: Denies fevers, chills or abnormal weight loss Eyes: Denies blurriness of vision Ears, nose, mouth, throat, and face: Denies mucositis or sore throat Respiratory: Denies cough, dyspnea or wheezes Cardiovascular: Denies palpitation, chest discomfort or lower extremity swelling Gastrointestinal:  Denies nausea, constipation, diarrhea GU: Denies dysuria or incontinence Skin: Denies abnormal skin rashes Neurological: Per HPI Musculoskeletal: Denies joint pain, back or neck discomfort. No decrease in ROM Behavioral/Psych: Denies anxiety, disturbance in thought content, and mood instability  Physical Exam: Vitals:   05/22/23 0930  BP: (!) 149/74  Pulse: 91  Resp: 20  Temp: 98.4 F (36.9 C)  SpO2: 99%    KPS: 80. General: Alert, cooperative, pleasant, in no acute distress Head: Craniotomy scar noted, dry and intact. EENT: No conjunctival injection or scleral icterus. Oral mucosa moist Lungs: Resp effort normal Cardiac: Regular rate and rhythm Abdomen: Soft, non-distended abdomen Skin: No rashes cyanosis or petechiae. Extremities: No clubbing or edema  Neurologic Exam: Mental Status: Awake, alert, attentive to examiner. Oriented to self and environment. Language is fluent with intact comprehension.  Cranial Nerves: Visual acuity is grossly normal. Visual fields are full. Extra-ocular movements intact. No ptosis. Face is symmetric, tongue midline. Motor: Tone and bulk are normal.Power is impaired in left leg, 4+/5 distally. Reflexes are symmetric, no pathologic reflexes present. Impaired heel to shin left leg Sensory: Stocking sensory loss Gait: Cane assisted  Labs: I have reviewed the data as listed    Component Value Date/Time   NA 138 04/02/2023 0259   NA 144 04/15/2020 1515   K 4.0 04/02/2023 0259   CL 104 04/02/2023 0259    CO2 26 04/02/2023 0259   GLUCOSE 116 (H) 04/02/2023 0259   BUN 11 04/02/2023 0259   BUN 13 04/15/2020 1515   CREATININE 0.90 04/02/2023 0259   CALCIUM 8.2 (L) 04/02/2023 0259   PROT 7.1 03/30/2023 2020   PROT 7.1 12/19/2022 1002   ALBUMIN 4.1 03/30/2023 2020   ALBUMIN 4.7 12/19/2022 1002   AST 29  03/30/2023 2020   ALT 22 03/30/2023 2020   ALKPHOS 74 03/30/2023 2020   BILITOT 0.6 03/30/2023 2020   BILITOT 0.2 12/19/2022 1002   GFRNONAA >60 04/02/2023 0259   GFRAA >60 04/17/2020 1313   Lab Results  Component Value Date   WBC 8.8 04/02/2023   NEUTROABS 5.1 03/30/2023   HGB 12.3 (L) 04/02/2023   HCT 36.9 (L) 04/02/2023   MCV 93.7 04/02/2023   PLT 158 04/02/2023   Imaging:  [facility] Clinician Interpretation: I have personally reviewed the CNS images as listed.  My interpretation, in the context of the patient's clinical presentation, is stable disease  CLINICAL DATA:  Brain/CNS neoplasm. Assess treatment response. Malignant meningioma of meninges.   EXAM: MRI HEAD WITHOUT AND WITH CONTRAST   TECHNIQUE: Multiplanar, multiecho pulse sequences of the brain and surrounding structures were obtained without and with intravenous contrast.   CONTRAST:  8mL GADAVIST GADOBUTROL 1 MMOL/ML IV SOLN   COMPARISON:  Head CT 11/13/2022. MRI 06/08/2022. 03/26/2021. 02/11/2020.   FINDINGS: Brain: Diffusion imaging is negative for acute or subacute infarction. No abnormality affects the brainstem or cerebellum. Cerebral hemispheres show chronic abnormal T2 and FLAIR signal within the deep white matter as seen previously. Previous craniotomy at the vertex for tumor resection. Stable volume loss and gliosis at the frontoparietal vertices related to the previous tumor in subsequent resection. 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 No evidence of recurrent focal tumor. The patient has  chronic nonspecific dural thickening in the vertex region of both sides, more on the left than the right, which is nonprogressive. No evidence of a focal enhancing lesion to suggest recurrent disease.   Vascular: No other vascular finding.   Skull and upper cervical spine: Craniotomy changes as above.   Sinuses/Orbits: Clear/normal   Other: None   IMPRESSION: 1. No change since prior studies as distant as September 2021. 2. Previous craniotomy at the vertex for tumor resection. Stable volume loss and gliosis at the frontoparietal vertices related to the previous tumor and subsequent resection. 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 No evidence of recurrent focal tumor. 3. Chronic nonspecific dural thickening in the vertex region of both sides, more on the left than the right, which is nonprogressive. No evidence of a focal enhancing lesion to suggest recurrent disease. 4. Chronic small-vessel ischemic changes of the cerebral hemispheric white matter.   Electronically Signed   By: Paulina Fusi M.D.   On: 04/03/2023 14:51   Assessment/Plan 1. Meningioma, recurrent of brain (HCC)  2. Seizures [facility])  Mr. Cervone is clinically stable today.  No further seizures.  MRI brain demonstrates stable findings.     Will recommend continuing LMG 200mg  BID, Vimpat can be discontinued if tolerated.  Klonopin can continue at 0.5mg  HS as discussed.    He will continue to work with PT for leg weakness, chronic.  We ask that Joshua [name] return to clinic in 6 months for seizure visit, or sooner if needed.  Next MRI in 12 months.   We appreciate the opportunity to participate in the care of Joshua [name].    All questions were answered. The patient knows to call the clinic with any problems, questions or concerns. No barriers to learning were detected.    I have spent a total  of 30 minutes  of face-to-face and non-face-to-face time, excluding clinical staff time, preparing to see patient, ordering tests and/or medications, counseling the patient, and care coordination    Henreitta Leber, MD Medical Director of Neuro-Oncology [facility] 05/22/23 9:32 AM

## 2023-05-23 ENCOUNTER — Other Ambulatory Visit: Payer: Self-pay | Admitting: Internal Medicine

## 2023-05-27 ENCOUNTER — Telehealth: Payer: Self-pay | Admitting: Internal Medicine

## 2023-05-27 NOTE — Telephone Encounter (Signed)
Scheduled per 11/04 los, patient has been called and voicemail was left.

## 2023-05-30 NOTE — Progress Notes (Deleted)
Cardiology Clinic Note   Patient Name: TREYLEN GIBBS Date of Encounter: 05/30/2023  Primary Care Provider:  No primary care provider on file. Primary Cardiologist:  Armanda Magic, MD  Patient Profile    AVI KERSCHNER 62 year old male presents to clinic today for follow-up evaluation of his essential hypertension, paroxysmal SVT, and demand ischemia.  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   Meningioma (HCC)    Meningioma, recurrent of brain (HCC) 01/31/2019   Neuropathy 12/05/2019   Pre-diabetes    Rotator cuff tear    right   Seizures (HCC)    11/27/19 last sz 1 wk ago   Past Surgical History:  Procedure Laterality Date   APPLICATION OF CRANIAL NAVIGATION N/A 01/10/2019   Procedure: APPLICATION OF CRANIAL NAVIGATION;  Surgeon: Maeola Harman, MD;  Location: [facility] OR;  Service: Neurosurgery;  Laterality: N/A;   CATARACT EXTRACTION Right    COLONOSCOPY WITH PROPOFOL N/A 04/10/2023   Procedure: COLONOSCOPY WITH PROPOFOL;  Surgeon: Toney Reil, MD;  Location: [facility] ENDOSCOPY;  Service: Gastroenterology;  Laterality: N/A;   CRANIOTOMY Right 11/06/2012   Procedure: CRANIOTOMY TUMOR EXCISION;  Surgeon: Maeola Harman, MD;  Location: [facility] NEURO ORS;  Service: Neurosurgery;  Laterality: Right;  Right Parasagittal craniotomy for meningioma with Stealth   CRANIOTOMY Right 01/10/2019   Procedure: Right Parasagittal Craniotomy for Tumor;  Surgeon: Maeola Harman, MD;  Location: [facility] OR;  Service: Neurosurgery;  Laterality: Right;  Right parasagittal craniotomy for tumor   ESOPHAGOGASTRODUODENOSCOPY     JOINT  REPLACEMENT Right 2018   right hip   REVERSE SHOULDER ARTHROPLASTY Right 01/08/2021   Procedure: RIGHT REVERSE SHOULDER ARTHROPLASTY;  Surgeon: Cammy Copa, MD;  Location: [facility] OR;  Service: Orthopedics;  Laterality: Right;   right knee arthroscopy     SHOULDER ARTHROSCOPY Right 06/09/2020   Procedure: RIGHT SHOULDER ARTHROSCOPY AND DEBRIDEMENT;  Surgeon: Nadara Mustard, MD;  Location: [facility];  Service: Orthopedics;  Laterality: Right;    Allergies  No Known Allergies  History of Present Illness    DUKE WEISENSEL has a PMH of HTN, meningioma, BPH, SVT, and elevated troponins.  Echocardiogram 5/24 showed an EF of 60 to 65%.  Repeat echocardiogram showed an EF of 55-60%.  He was admitted to the hospital on 03/31/2023 and discharged on 04/02/2023.  He was noted to have an episode of SVT and was noted to have elevated troponins.  This was in the setting of sepsis secondary to prostatitis.  No further episodes of arrhythmia were noted while on telemetry.  He received antibiotics.  It was felt that there was no further indication for cardiac testing.  He presents to the clinic today for follow-up evaluation and states***.  *** denies chest pain, shortness of breath, lower extremity edema, fatigue, palpitations, melena, hematuria, hemoptysis, diaphoresis, weakness, presyncope, syncope, orthopnea, and PND.  SVT-heart rate today***.  Denies further episodes of accelerated or irregular heartbeat.  Was noted to have episode of SVT in the setting of prostatitis post prostate biopsy.  He was noted to be  septic and received antibiotics.  No further episodes of arrhythmia were noted during his cardiac monitoring. No plans for further cardiac testing Increase physical activity as tolerated Heart healthy low-sodium diet  Elevated troponins-denies chest pain.  Felt to be related to demand ischemia in the setting of sepsis and SVT.  Echocardiogram reassuring. No plans for further  cardiac evaluation  Essential hypertension-BP today***. Maintain blood pressure log Heart healthy low-sodium diet Continue current medical therapy  Disposition: Follow-up with Dr. Azucena Cecil or APP in 6 months.   Home Medications    Prior to Admission medications   Medication Sig Start Date End Date Taking? Authorizing Provider  acetaminophen (TYLENOL) 500 MG tablet Take 1,000 mg by mouth every 6 (six) hours as needed for moderate pain or headache.    [provider]  albuterol (VENTOLIN HFA) 108 (90 Base) MCG/ACT inhaler Inhale 2 puffs into the lungs every 6 (six) hours as needed for wheezing or shortness of breath. 08/13/21   Newlin, Enobong, MD  amLODipine (NORVASC) 5 MG tablet TAKE 1 TABLET (5 MG TOTAL) BY MOUTH DAILY. TO LOWER BLOOD PRESSURE. STOP LOSARTAN 08/13/21   Hoy Register, MD  aspirin 81 MG chewable tablet Chew 81 mg by mouth daily.    [provider]  atorvastatin (LIPITOR) 20 MG tablet TAKE 1 TABLET (20 MG TOTAL) BY MOUTH DAILY. 12/08/21   Hoy Register, MD  clonazePAM (KLONOPIN) 0.5 MG tablet TAKE 1 TABLET (0.5 MG TOTAL) BY MOUTH AT BEDTIME. 05/23/23   Vaslow, Georgeanna Lea, MD  lamoTRIgine (LAMICTAL) 200 MG tablet Take 1 tablet (200 mg total) by mouth 2 (two) times daily. 02/20/23   Vaslow, Georgeanna Lea, MD  lidocaine (LIDODERM) 5 % Place 1 patch onto the skin daily. Remove & Discard patch within 12 hours or as directed by MD 04/07/22   Marita Kansas, PA-C  meclizine (ANTIVERT) 25 MG tablet Take 1 tablet (25 mg total) by mouth 3 (three) times daily as needed for dizziness. 01/04/22   Horton, Clabe Seal, DO  metFORMIN (GLUCOPHAGE) 500 MG tablet Take 1 tablet (500 mg total) by mouth 2 (two) times daily. 08/13/21   Hoy Register, MD  methocarbamol (ROBAXIN) 750 MG tablet Take 750 mg by mouth daily as needed for muscle spasms.    [provider]  omeprazole (PRILOSEC) 20 MG capsule Take 20 mg by mouth daily. 08/29/22   [provider]  propranolol  (INDERAL) 10 MG tablet Take 1 tablet (10 mg total) by mouth 3 (three) times daily as needed (palpitations). 05/31/22   Swinyer, Zachary George, NP  tadalafil (CIALIS) 5 MG tablet Take 1 tablet (5 mg total) by mouth daily as needed for erectile dysfunction. 1 hour prior to sexual activity 01/17/22   Sondra Come, MD  tamsulosin (FLOMAX) 0.4 MG CAPS capsule Take 1 capsule (0.4 mg total) by mouth daily. 11/23/21   Michiel Cowboy A, PA-C  VITAMIN A PO Take 1 capsule by mouth daily.    [provider]    Family History    Family History  Problem Relation Age of Onset   Hypertension Mother    Dementia Mother    Diabetes Mother    Hypertension Father    CAD Father    Diabetes Father    CAD Brother    He indicated that his mother is deceased. He indicated that his father is deceased. He indicated that his sister is alive. He indicated that his brother is deceased.  Social History    Social History  Socioeconomic History   Marital status: Legally Separated    Spouse name: Not on file   Number of children: 2   Years of education: Not on file   Highest education level: Not on file  Occupational History   Not on file  Tobacco Use   Smoking status: Former    Current packs/day: 0.00    Average packs/day: (0.2 ttl pk-yrs)    Types: Cigarettes    Start date: 01/29/2004    Quit date: 01/29/2019    Years since quitting: 4.3    Passive exposure: Past   Smokeless tobacco: Never   Tobacco comments:    weekend smoker  Vaping Use   Vaping status: Some Days   Substances: Nicotine, Flavoring  Substance and Sexual Activity   Alcohol use: Yes    Comment: social   Drug use: No   Sexual activity: Not Currently  Other Topics Concern   Not on file  Social History Narrative      Caffeine- soda occass   Social Determinants of Health   Financial Resource Strain: Not on file  Food Insecurity: No Food Insecurity (03/31/2023)   Hunger Vital Sign    Worried About Running Out of Food in  the Last Year: Never true    Ran Out of Food in the Last Year: Never true  Transportation Needs: No Transportation Needs (03/31/2023)   PRAPARE - Administrator, Civil Service (Medical): No    Lack of Transportation (Non-Medical): No  Physical Activity: Not on file  Stress: Not on file  Social Connections: Not on file  Intimate Partner Violence: Not At Risk (03/31/2023)   Humiliation, Afraid, Rape, and Kick questionnaire    Fear of Current or Ex-Partner: No    Emotionally Abused: No    Physically Abused: No    Sexually Abused: No     Review of Systems    General:  No chills, fever, night sweats or weight changes.  Cardiovascular:  No chest pain, dyspnea on exertion, edema, orthopnea, palpitations, paroxysmal nocturnal dyspnea. Dermatological: No rash, lesions/masses Respiratory: No cough, dyspnea Urologic: No hematuria, dysuria Abdominal:   No nausea, vomiting, diarrhea, bright red blood per rectum, melena, or hematemesis Neurologic:  No visual changes, wkns, changes in mental status. All other systems reviewed and are otherwise negative except as noted above.  Physical Exam    VS:  There were no vitals taken for this visit. , BMI There is no height or weight on file to calculate BMI. GEN: Well nourished, well developed, in no acute distress. HEENT: normal. Neck: Supple, no JVD, carotid bruits, or masses. Cardiac: RRR, no murmurs, rubs, or gallops. No clubbing, cyanosis, edema.  Radials/DP/PT 2+ and equal bilaterally.  Respiratory:  Respirations regular and unlabored, clear to auscultation bilaterally. GI: Soft, nontender, nondistended, BS + x 4. MS: no deformity or atrophy. Skin: warm and dry, no rash. Neuro:  Strength and sensation are intact. Psych: Normal affect.  Accessory Clinical Findings    Recent Labs: 03/30/2023: ALT 22 03/31/2023: TSH 0.944 04/02/2023: BUN 11; Creatinine, Ser 0.90; Hemoglobin 12.3; Magnesium 2.2; Platelets 158; Potassium 4.0; Sodium 138    Recent Lipid Panel    Component Value Date/Time   CHOL 134 01/03/2023 1112   TRIG 103 01/03/2023 1112   HDL 67 01/03/2023 1112   CHOLHDL 2.0 01/03/2023 1112   LDLCALC 48 01/03/2023 1112    No BP recorded.  {Refresh Note OR Click here to enter BP  :1}***    ECG personally  reviewed by me today- ***    Echocardiogram 04/01/23  IMPRESSIONS     1. Left ventricular ejection fraction, by estimation, is 55 to 60%. Left  ventricular ejection fraction by PLAX is 57 %. The left ventricle has  normal function. The left ventricle has no regional wall motion  abnormalities. There is mild left ventricular  hypertrophy. Left ventricular diastolic parameters are consistent with  Grade I diastolic dysfunction (impaired relaxation). The average left  ventricular global longitudinal strain is -17.0 %. The global longitudinal  strain is normal.   2. Right ventricular systolic function is normal. The right ventricular  size is normal. There is normal pulmonary artery systolic pressure.   3. The mitral valve is normal in structure. Mild mitral valve  regurgitation.   4. The aortic valve is tricuspid. Aortic valve regurgitation is not  visualized.   5. The inferior vena cava is dilated in size with >50% respiratory  variability, suggesting right atrial pressure of 8 mmHg.   FINDINGS   Left Ventricle: Left ventricular ejection fraction, by estimation, is 55  to 60%. Left ventricular ejection fraction by PLAX is 57 %. The left  ventricle has normal function. The left ventricle has no regional wall  motion abnormalities. The average left  ventricular global longitudinal strain is -17.0 %. The global longitudinal  strain is normal. The left ventricular internal cavity size was normal in  size. There is mild left ventricular hypertrophy. Left ventricular  diastolic parameters are consistent  with Grade I diastolic dysfunction (impaired relaxation).   Right Ventricle: The right ventricular size  is normal. No increase in  right ventricular wall thickness. Right ventricular systolic function is  normal. There is normal pulmonary artery systolic pressure. The tricuspid  regurgitant velocity is 2.60 m/s, and   with an assumed right atrial pressure of 8 mmHg, the estimated right  ventricular systolic pressure is 35.0 mmHg.   Left Atrium: Left atrial size was normal in size.   Right Atrium: Right atrial size was normal in size.   Pericardium: There is no evidence of pericardial effusion.   Mitral Valve: The mitral valve is normal in structure. Mild mitral valve  regurgitation.   Tricuspid Valve: The tricuspid valve is normal in structure. Tricuspid  valve regurgitation is not demonstrated.   Aortic Valve: The aortic valve is tricuspid. Aortic valve regurgitation is  not visualized. Aortic valve peak gradient measures 4.6 mmHg.   Pulmonic Valve: The pulmonic valve was not well visualized. Pulmonic valve  regurgitation is not visualized.   Aorta: The aortic root is normal in size and structure.   Venous: The inferior vena cava is dilated in size with greater than 50%  respiratory variability, suggesting right atrial pressure of 8 mmHg.   IAS/Shunts: No atrial level shunt detected by color flow Doppler.       Assessment & Plan   1.  ***   Thomasene Ripple. Loras Grieshop NP-C     05/30/2023, 4:44 PM [facility] [address] Office [phone] Fax [phone]    I spent***minutes examining this patient, reviewing medications, and using patient centered shared decision making involving her cardiac care.   I spent greater than 20 minutes reviewing her past medical history,  medications, and prior cardiac tests.

## 2023-06-01 ENCOUNTER — Telehealth: Payer: Self-pay | Admitting: Cardiology

## 2023-06-01 ENCOUNTER — Ambulatory Visit: Payer: 59 | Admitting: General Practice

## 2023-06-01 NOTE — Telephone Encounter (Signed)
Patient called to let Dr. Mayford Knife know he was disappointed and frustrated that his appointment was scheduled at a different location to [facility] (his usual location) and his visit had to be rescheduled.

## 2023-06-02 NOTE — Telephone Encounter (Signed)
Call to patient to offer in person visit with Dr. Mayford Knife, no answer. Left detailed message per DPR asking patient to call our office to discuss.

## 2023-06-05 ENCOUNTER — Telehealth: Payer: Self-pay | Admitting: Cardiology

## 2023-06-05 NOTE — Telephone Encounter (Signed)
Patient is returning phone call from Friday

## 2023-06-05 NOTE — Telephone Encounter (Signed)
Patient requests appt w/ Dr. Mayford Knife, canceled appt w/ T. Conte on 07/07/23 and scheduled with Dr. Mayford Knife on 07/06/23.

## 2023-06-07 ENCOUNTER — Ambulatory Visit (INDEPENDENT_AMBULATORY_CARE_PROVIDER_SITE_OTHER): Payer: 59 | Admitting: Surgical

## 2023-06-07 ENCOUNTER — Other Ambulatory Visit: Payer: Self-pay

## 2023-06-07 ENCOUNTER — Encounter: Payer: Self-pay | Admitting: Surgical

## 2023-06-07 VITALS — Ht 69.0 in | Wt 187.0 lb

## 2023-06-07 DIAGNOSIS — M19012 Primary osteoarthritis, left shoulder: Secondary | ICD-10-CM | POA: Diagnosis not present

## 2023-06-07 NOTE — Progress Notes (Signed)
Office Visit Note   Patient: Joshua [name]           Date of Birth: Feb 20, 1961           MRN: [id number] Visit Date: 06/07/2023 Requested by: No referring provider defined for this encounter. PCP: Pcp, No  Subjective: Chief Complaint  Patient presents with   Left Shoulder - Pain    HPI: Joshua [name] is a 62 y.o. male who presents to the office reporting left shoulder pain.  Patient has history of left shoulder pain with prior glenohumeral injections that have given him great relief for about 2 to 3 months.  He had MRI arthrogram ordered of the left shoulder but this was never completed as he was feeling better after one of his injections.  He describes lateral shoulder pain diffusely and states that this pain is causing him to wake up from sleep at night at times.  It is getting to the point where he is considering surgery for this shoulder.  He has history of right shoulder reverse shoulder arthroplasty and this is doing well for him and he is very satisfied with how his right shoulder is feeling.  He is right-hand dominant.  Last injection was on 11/07/2022.  Does have some neck pain as well at times with some history of cervical spine stenosis..                ROS: All systems reviewed are negative as they relate to the chief complaint within the history of present illness.  Patient denies fevers or chills.  Assessment & Plan: Visit Diagnoses:  1. Glenohumeral arthritis, left     Plan: Patient is a 62 year old male who presents for evaluation of left shoulder pain.  Has had multiple injections to the left shoulder with good relief for about 2 to 3 months but the consistent nature of his pain is getting to the point where he is considering surgical intervention.  However, he has not had the MRI arthrogram that has been ordered in the past and so we need to reorder MRI in order to delineate the specific pathology that is ongoing his shoulder and causing his pain.  Glenohumeral  injection was administered today and he understands that he cannot have any joint arthroplasty on the shoulder until 3 months after this injection.  Tolerated procedure well without complication.  Follow-up after MRI to review results.  MRI reordered to evaluate for occult arthritis versus rotator cuff pathology versus SLAP tear.  Follow-Up Instructions: No follow-ups on file.   Orders:  Orders Placed This Encounter  Procedures   US Guided Needle Placement - No Linked Charges   No orders of the defined types were placed in this encounter.     Procedures: No procedures performed   Clinical Data: No additional findings.  Objective: Vital Signs: Ht 5\' 9"  (1.753 m)   Wt 187 lb (84.8 kg)   BMI 27.62 kg/m   Physical Exam:  Constitutional: Patient appears well-developed HEENT:  Head: Normocephalic Eyes:EOM are normal Neck: Normal range of motion Cardiovascular: Normal rate Pulmonary/chest: Effort normal Neurologic: Patient is alert Skin: Skin is warm Psychiatric: Patient has normal mood and affect  Ortho Exam: Ortho exam demonstrates left shoulder with 45 degrees X rotation, 90 degrees abduction, 140 degrees forward elevation passively and actively.  Intact rotator cuff strength of supra, infra, subscap rated 5/5.  Does reproduce pain.  Positive O'Brien sign.  Tenderness over the bicipital groove of the left shoulder.  Not really any significant tenderness over the [facility] joint.  No incision or cellulitis noted over the left shoulder.  Axillary nerve is intact with deltoid firing.  Specialty Comments:  Narrative & Impression CLINICAL DATA:  Low back pain and bilateral hip pain with extension down the left leg. Six months duration. Symptoms worsened after a fall 2 months ago.   EXAM: MRI LUMBAR SPINE WITHOUT CONTRAST   TECHNIQUE: Multiplanar, multisequence MR imaging of the lumbar spine was performed. No intravenous contrast was administered.   COMPARISON:  Radiography  01/11/2023   FINDINGS: Segmentation:  5 lumbar type vertebral bodies.   Alignment: Straightening of the normal lumbar lordosis. Scoliotic curvature convex to the right with the apex at L3.   Vertebrae: No fracture in the lumbar region. Abnormal edema visible at the edge of the study at the S3 level consistent with a recent sacral fracture. This was not evaluated completely. Benign appearing hemangioma within the left side of the L4 vertebral body.   Conus medullaris and cauda equina: Conus extends to the L1 level. Conus and cauda equina appear normal.   Paraspinal and other soft tissues: Negative   Disc levels:   T11-12, T12-L1 and L1-2: No disc pathology. Right-sided osteophytes at the L1-2 level do encroach somewhat of upon the extraforaminal region but the significance is doubtful.   L2-3: Prominent right lateral bridging osteophytes. Right foraminal to extraforaminal encroachment could possibly affect the right L2 nerve, but finding is obviously chronic.   L3-4: Similar prominent right-sided osteophytes with encroachment upon the right lateral foraminal to extraforaminal region.   L4-5: Bulging of the discs with lateral osteophytes prominent on the left. Mild facet and ligamentous hypertrophy. Mild narrowing of the lateral recesses but no compression of the nerves within the canal. The left-sided osteophytes could possibly affect the L4 nerve in the extra-spinal region.   L5-S1: Endplate osteophytes and bulging of the disc. Bilateral facet osteoarthritis. No canal or lateral recess stenosis. Bilateral foraminal encroachment could affect either L5 nerve. The facet arthritis could be painful.   IMPRESSION: 1. Abnormal edema at the edge of the study at the S3 level consistent with a recent sacral fracture. This was not evaluated completely. 2. Scoliotic curvature convex to the right with the apex at L3. 3. Right-sided bridging osteophytes at L2-3 and L3-4 could  possibly affect the right-sided exiting nerves. Finding is obviously chronic. 4. Left-sided osteophytes at L4-5 could possibly affect the left L4 nerve in the extra-spinal region. This also appears chronic. 5. Bilateral foraminal stenosis due to osteophytes, bulging of the disc and facet arthropathy at L5-S1 that could affect either L5 nerve. The facet arthritis could be painful.  Imaging: No results found.   PMFS History: Patient Active Problem List   Diagnosis Date Noted   History of colonic polyps 04/10/2023   Acute prostatitis 04/01/2023   DM (diabetes mellitus) (HCC) 03/31/2023   Demand ischemia of myocardium (HCC) 03/31/2023   Severe sepsis (HCC) 03/30/2023   Elevated troponin 03/30/2023   Paroxysmal SVT (supraventricular tachycardia) (HCC) 03/30/2023   High anion gap metabolic acidosis 03/30/2023   Hematuria 03/30/2023   S/p prostate biopsy 03/29/23 03/30/2023   Hypotension 03/30/2023   History of recurrent meningioma s/p resection x2 03/30/2023   Aortic atherosclerosis (HCC) 10/20/2022   Pain in limb 06/14/2022   Pain due to onychomycosis of toenails of both feet 03/04/2021   Arthritis of right shoulder region    S/P reverse total shoulder arthroplasty, 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   Essential hypertension 07/02/2020  Prediabetes 07/02/2020   Polyuria 07/02/2020   Nontraumatic complete tear of right rotator cuff    Impingement syndrome of right shoulder    Neuropathy 12/05/2019   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   Seizures (HCC) 01/31/2019   Meningioma, recurrent of brain (HCC) 01/31/2019   Meningioma (HCC) 01/03/2019   Diffuse idiopathic skeletal hyperostosis 06/05/2013   Past Medical History:  Diagnosis Date   Anxiety    Brain tumor (HCC) 02/20/2013   brain tumor removed in March 2014, Dr Rush Farmer   Diffuse idiopathic skeletal hyperostosis 06/05/2013   Dizziness    Enlarged prostate    GERD (gastroesophageal reflux disease)    Headache(784.0)     scattered   High cholesterol    Hypertension    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   Meningioma (HCC)    Meningioma, recurrent of brain (HCC) 01/31/2019   Neuropathy 12/05/2019   Pre-diabetes    Rotator cuff tear    right   Seizures (HCC)    11/27/19 last sz 1 wk ago    Family History  Problem Relation Age of Onset   Hypertension Mother    Dementia Mother    Diabetes Mother    Hypertension Father    CAD Father    Diabetes Father    CAD Brother     Past Surgical History:  Procedure Laterality Date   APPLICATION OF CRANIAL NAVIGATION N/A 01/10/2019   Procedure: APPLICATION OF CRANIAL NAVIGATION;  Surgeon: Maeola Harman, MD;  Location: [facility] OR;  Service: Neurosurgery;  Laterality: N/A;   CATARACT EXTRACTION Right    COLONOSCOPY WITH PROPOFOL N/A 04/10/2023   Procedure: COLONOSCOPY WITH PROPOFOL;  Surgeon: Toney Reil, MD;  Location: [facility] ENDOSCOPY;  Service: Gastroenterology;  Laterality: N/A;   CRANIOTOMY Right 11/06/2012   Procedure: CRANIOTOMY TUMOR EXCISION;  Surgeon: Maeola Harman, MD;  Location: [facility] NEURO ORS;  Service: Neurosurgery;  Laterality: Right;  Right Parasagittal craniotomy for meningioma with Stealth   CRANIOTOMY Right 01/10/2019   Procedure: Right Parasagittal Craniotomy for Tumor;  Surgeon: Maeola Harman, MD;  Location: [facility] OR;  Service: Neurosurgery;  Laterality: Right;  Right parasagittal craniotomy for tumor   ESOPHAGOGASTRODUODENOSCOPY     JOINT REPLACEMENT Right 2018   right hip   REVERSE SHOULDER ARTHROPLASTY Right 01/08/2021   Procedure: RIGHT REVERSE SHOULDER ARTHROPLASTY;  Surgeon: Cammy Copa, MD;  Location: [facility] OR;  Service: Orthopedics;  Laterality: Right;   right knee arthroscopy     SHOULDER ARTHROSCOPY Right 06/09/2020   Procedure: RIGHT SHOULDER ARTHROSCOPY AND DEBRIDEMENT;  Surgeon: Nadara Mustard, MD;  Location: [facility];  Service: Orthopedics;  Laterality: Right;   Social History    Occupational History   Not on file  Tobacco Use   Smoking status: Former    Current packs/day: 0.00    Average packs/day: (0.2 ttl pk-yrs)    Types: Cigarettes    Start date: 01/29/2004    Quit date: 01/29/2019    Years since quitting: 4.3    Passive exposure: Past   Smokeless tobacco: Never   Tobacco comments:    weekend smoker  Vaping Use   Vaping status: Some Days   Substances: Nicotine, Flavoring  Substance and Sexual Activity   Alcohol use: Yes    Comment: social   Drug use: No   Sexual activity: Not Currently

## 2023-06-20 DIAGNOSIS — M9902 Segmental and somatic dysfunction of thoracic region: Secondary | ICD-10-CM | POA: Diagnosis not present

## 2023-06-20 DIAGNOSIS — M9903 Segmental and somatic dysfunction of lumbar region: Secondary | ICD-10-CM | POA: Diagnosis not present

## 2023-06-20 DIAGNOSIS — M5416 Radiculopathy, lumbar region: Secondary | ICD-10-CM | POA: Diagnosis not present

## 2023-06-20 DIAGNOSIS — M9901 Segmental and somatic dysfunction of cervical region: Secondary | ICD-10-CM | POA: Diagnosis not present

## 2023-07-06 ENCOUNTER — Ambulatory Visit: Payer: 59 | Attending: Cardiology | Admitting: Cardiology

## 2023-07-06 VITALS — BP 130/70 | HR 53 | Ht 69.0 in | Wt 189.0 lb

## 2023-07-06 DIAGNOSIS — Z8249 Family history of ischemic heart disease and other diseases of the circulatory system: Secondary | ICD-10-CM

## 2023-07-06 DIAGNOSIS — E785 Hyperlipidemia, unspecified: Secondary | ICD-10-CM

## 2023-07-06 DIAGNOSIS — I10 Essential (primary) hypertension: Secondary | ICD-10-CM | POA: Diagnosis not present

## 2023-07-06 DIAGNOSIS — R002 Palpitations: Secondary | ICD-10-CM | POA: Diagnosis not present

## 2023-07-06 DIAGNOSIS — I700 Atherosclerosis of aorta: Secondary | ICD-10-CM

## 2023-07-06 NOTE — Progress Notes (Signed)
Cardiology Office Note    Date:  07/06/2023   ID:  Joshua [name], DOB July 23, 1960, MRN [id number]  PCP:  Pcp, No  Cardiologist:  Armanda Magic, MD   Chief Complaint  Patient presents with   Follow-up    Aortic atherosclerosis, hypertension, hyperlipidemia, palpitations    History of Present Illness:  Joshua [name] is a 62 y.o. male with a hx of malignant meningioma s/p resection 2014 and reoccurence 12/2018 s/p resection with seizures and recent dx of aortic atherosclerosis done at time of workup of Neurologic symptoms.  This was noted Ct angio of neck.  He also has a history of hypertension, hyperlipidemia and aortic atherosclerosis.  He is here today for followup and is doing well.  He denies any chest pain or pressure, SOB, DOE, PND, orthopnea, LE edema, dizziness or syncope. He continues to sporadically have palpitations but they are sparse in occurrence and only lasts a few seconds.   He is compliant with his meds and is tolerating meds with no SE.    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   Meningioma (HCC)    Meningioma, recurrent of brain (HCC) 01/31/2019   Neuropathy 12/05/2019   Pre-diabetes    Rotator cuff tear    right   Seizures (HCC)    11/27/19 last sz 1 wk ago    Past Surgical History:  Procedure Laterality Date   APPLICATION OF CRANIAL NAVIGATION N/A 01/10/2019   Procedure: APPLICATION OF CRANIAL NAVIGATION;  Surgeon: Maeola Harman, MD;  Location: [facility] OR;  Service: Neurosurgery;  Laterality: N/A;   CATARACT EXTRACTION Right    COLONOSCOPY WITH PROPOFOL N/A 04/10/2023   Procedure: COLONOSCOPY WITH PROPOFOL;   Surgeon: Toney Reil, MD;  Location: [facility] ENDOSCOPY;  Service: Gastroenterology;  Laterality: N/A;   CRANIOTOMY Right 11/06/2012   Procedure: CRANIOTOMY TUMOR EXCISION;  Surgeon: Maeola Harman, MD;  Location: [facility]UR[facility] ORS;  Service: Neurosurgery;  Laterality: Right;  Right Parasagittal craniotomy for meningioma with Stealth   CRANIOTOMY Right 01/10/2019   Procedure: Right Parasagittal Craniotomy for Tumor;  Surgeon: Maeola Harman, MD;  Location: [facility] OR;  Service: Neurosurgery;  Laterality: Right;  Right parasagittal craniotomy for tumor   ESOPHAGOGASTRODUODENOSCOPY     JOINT REPLACEMENT Right 2018   right hip   REVERSE SHOULDER ARTHROPLASTY Right 01/08/2021   Procedure: RIGHT REVERSE SHOULDER ARTHROPLASTY;  Surgeon: Cammy Copa, MD;  Location: [facility] OR;  Service: Orthopedics;  Laterality: Right;   right knee arthroscopy     SHOULDER ARTHROSCOPY Right 06/09/2020   Procedure: RIGHT SHOULDER ARTHROSCOPY AND DEBRIDEMENT;  Surgeon: Nadara Mustard, MD;  Location: [facility];  Service: Orthopedics;  Laterality: Right;    Current Medications: Current Meds  Medication Sig   acetaminophen (TYLENOL) 500 MG tablet Take 1,000 mg by mouth every 6 (six) hours as needed for moderate pain or headache.   albuterol (VENTOLIN HFA) 108 (90 Base) MCG/ACT inhaler Inhale 2 puffs into the lungs every 6 (six) hours as needed for wheezing or shortness of breath.   amLODipine (NORVASC) 5 MG tablet TAKE 1 TABLET (5  MG TOTAL) BY MOUTH DAILY. TO LOWER BLOOD PRESSURE. STOP LOSARTAN   aspirin 81 MG chewable tablet Chew 81 mg by mouth daily.   atorvastatin (LIPITOR) 20 MG tablet TAKE 1 TABLET (20 MG TOTAL) BY MOUTH DAILY.   clonazePAM (KLONOPIN) 0.5 MG tablet TAKE 1 TABLET (0.5 MG TOTAL) BY MOUTH AT BEDTIME.   lamoTRIgine (LAMICTAL) 200 MG tablet Take 1 tablet (200 mg total) by mouth 2 (two) times daily.   lidocaine (LIDODERM) 5 % Place 1 patch onto the skin daily. Remove & Discard patch within 12  hours or as directed by MD   meclizine (ANTIVERT) 25 MG tablet Take 1 tablet (25 mg total) by mouth 3 (three) times daily as needed for dizziness.   metFORMIN (GLUCOPHAGE) 500 MG tablet Take 1 tablet (500 mg total) by mouth 2 (two) times daily.   methocarbamol (ROBAXIN) 750 MG tablet Take 750 mg by mouth daily as needed for muscle spasms.   omeprazole (PRILOSEC) 20 MG capsule Take 20 mg by mouth daily.   propranolol (INDERAL) 10 MG tablet Take 1 tablet (10 mg total) by mouth 3 (three) times daily as needed (palpitations).   tadalafil (CIALIS) 5 MG tablet Take 1 tablet (5 mg total) by mouth daily as needed for erectile dysfunction. 1 hour prior to sexual activity   tamsulosin (FLOMAX) 0.4 MG CAPS capsule Take 1 capsule (0.4 mg total) by mouth daily.   VITAMIN A PO Take 1 capsule by mouth daily.    Allergies:   Patient has no known allergies.   Social History   Socioeconomic History   Marital status: Legally Separated    Spouse name: Not on file   Number of children: 2   Years of education: Not on file   Highest education level: Not on file  Occupational History   Not on file  Tobacco Use   Smoking status: Former    Current packs/day: 0.00    Average packs/day: (0.2 ttl pk-yrs)    Types: Cigarettes    Start date: 01/29/2004    Quit date: 01/29/2019    Years since quitting: 4.4    Passive exposure: Past   Smokeless tobacco: Never   Tobacco comments:    weekend smoker  Vaping Use   Vaping status: Some Days   Substances: Nicotine, Flavoring  Substance and Sexual Activity   Alcohol use: Yes    Comment: social   Drug use: No   Sexual activity: Not Currently  Other Topics Concern   Not on file  Social History Narrative      Caffeine- soda occass   Social Drivers of Health   Financial Resource Strain: Not on file  Food Insecurity: No Food Insecurity (03/31/2023)   Hunger Vital Sign    Worried About Running Out of Food in the Last Year: Never true    Ran Out of Food in the  Last Year: Never true  Transportation Needs: No Transportation Needs (03/31/2023)   PRAPARE - Administrator, Civil Service (Medical): No    Lack of Transportation (Non-Medical): No  Physical Activity: Not on file  Stress: Not on file  Social Connections: Not on file     Family History:  The patient's family history includes CAD in his brother and father; Dementia in his mother; Diabetes in his father and mother; Hypertension in his father and mother.   ROS:   Please see the history of present illness.    ROS All other systems reviewed and are negative.  No data to display             PHYSICAL EXAM:   VS:  BP 130/70 (BP Location: Left Arm)   Pulse (!) 53   Ht 5\' 9"  (1.753 m)   Wt 189 lb (85.7 kg)   SpO2 98%   BMI 27.91 kg/m    GEN: Well nourished, well developed in no acute distress HEENT: Normal NECK: No JVD; No carotid bruits LYMPHATICS: No lymphadenopathy CARDIAC:RRR, no murmurs, rubs, gallops RESPIRATORY:  Clear to auscultation without rales, wheezing or rhonchi  ABDOMEN: Soft, non-tender, non-distended MUSCULOSKELETAL:  No edema; No deformity  SKIN: Warm and dry NEUROLOGIC:  Alert and oriented x 3 PSYCHIATRIC:  Normal affect   Wt Readings from Last 3 Encounters:  07/06/23 189 lb (85.7 kg)  06/07/23 187 lb (84.8 kg)  05/22/23 187 lb (84.8 kg)      Studies/Labs Reviewed:    Recent Labs: 03/30/2023: ALT 22 03/31/2023: TSH 0.944 04/02/2023: BUN 11; Creatinine, Ser 0.90; Hemoglobin 12.3; Magnesium 2.2; Platelets 158; Potassium 4.0; Sodium 138   Lipid Panel    Component Value Date/Time   CHOL 134 01/03/2023 1112   TRIG 103 01/03/2023 1112   HDL 67 01/03/2023 1112   CHOLHDL 2.0 01/03/2023 1112   LDLCALC 48 01/03/2023 1112    Additional studies/ records that were reviewed today include:  OV notes and CT of neck    ASSESSMENT:    1. Aortic atherosclerosis (HCC)   2. Hyperlipidemia LDL goal <70   3. Family history of premature CAD    4. Palpitations   5. Essential hypertension       PLAN:  In order of problems listed above:  1.  Aortic atherosclerosis -noted on Neck CT done for workup of neurologic sx.  -no evidence of carotid artery disease -this was not noted on a Chest CTA done 02/2019 -no coronary artery calcifications mentioned on CT -Continue aggressive risk factor modification of BP control  2.  HLD -followed by PCP -I have personally reviewed and interpreted outside labs performed by patient's PCP which showed LDL 48 and HDL 67 on 01/03/2023 ALT 22 -Continue drug management with atorvastatin 20 mg daily with as needed refills   3.  Fm hx of premature CAD -he is asymptomatic but has a very strong fm hx of brothers and his father having MIs at an early age -he also has HTN, HLD and vascular disease of his aorta Eugenie Birks Myoview 2021 showed no ischemia -Coronary calcium score 2021 was 0 -EKGs have shown iRBBB in the past -Will repeat coronary calcium score next year  4.  Palpitations -Event monitor 2021 was normal -Event monitor 2023 with rare -he has rare palpitations that are unchanged from prior and have been PACs -Continue prescription drug management with propranolol 10 mg 3 times daily as needed for palpitation breakthrough  5.  Hypertension -BP controlled on exam today -Continue drug managed with amlodipine 5 mg daily  Followup with me in 1 year   Medication Adjustments/Labs and Tests Ordered: Current medicines are reviewed at length with the patient today.  Concerns regarding medicines are outlined above.  Medication changes, Labs and Tests ordered today are listed in the Patient Instructions below.  There are no Patient Instructions on file for this visit.   Signed, Armanda Magic, MD  07/06/2023 11:18 AM    [facility] [address] [place], [place], [place]  [address] Phone: [phone]; Fax: [phone]

## 2023-07-06 NOTE — Addendum Note (Signed)
Addended by: Luellen Pucker on: 07/06/2023 11:29 AM   Modules accepted: Orders

## 2023-07-06 NOTE — Patient Instructions (Signed)
Medication Instructions:  Your physician recommends that you continue on your current medications as directed. Please refer to the Current Medication list given to you today.  *If you need a refill on your cardiac medications before your next appointment, please call your pharmacy*   Lab Work: None.  If you have labs (blood work) drawn today and your tests are completely normal, you will receive your results only by: MyChart Message (if you have MyChart) OR A paper copy in the mail If you have any lab test that is abnormal or we need to change your treatment, we will call you to review the results.   Testing/Procedures: Your doctor has ordered a coronary calcium score CT. This is a test that can provide early detection of coronary artery disease. Patient cost is usually $94-99.   Follow-Up: At [facility], you and your health needs are our priority.  As part of our continuing mission to provide you with exceptional heart care, we have created designated Provider Care Teams.  These Care Teams include your primary Cardiologist (physician) and Advanced Practice Providers (APPs -  Physician Assistants and Nurse Practitioners) who all work together to provide you with the care you need, when you need it.  We recommend signing up for the patient portal called "MyChart".  Sign up information is provided on this After Visit Summary.  MyChart is used to connect with patients for Virtual Visits (Telemedicine).  Patients are able to view lab/test results, encounter notes, upcoming appointments, etc.  Non-urgent messages can be sent to your provider as well.   To learn more about what you can do with MyChart, go to [email or web address].    Your next appointment:   1 year(s)  Provider:   Armanda Magic, MD

## 2023-07-07 ENCOUNTER — Ambulatory Visit: Payer: 59 | Admitting: Physician Assistant

## 2023-07-09 ENCOUNTER — Emergency Department (HOSPITAL_BASED_OUTPATIENT_CLINIC_OR_DEPARTMENT_OTHER)
Admission: EM | Admit: 2023-07-09 | Discharge: 2023-07-09 | Disposition: A | Discharge status: Home / Self Care | Payer: 59 | Attending: Emergency Medicine | Admitting: Emergency Medicine

## 2023-07-09 ENCOUNTER — Encounter (HOSPITAL_BASED_OUTPATIENT_CLINIC_OR_DEPARTMENT_OTHER): Payer: Self-pay | Admitting: Emergency Medicine

## 2023-07-09 ENCOUNTER — Other Ambulatory Visit: Payer: Self-pay

## 2023-07-09 ENCOUNTER — Emergency Department (HOSPITAL_BASED_OUTPATIENT_CLINIC_OR_DEPARTMENT_OTHER): Payer: 59 | Admitting: Radiology

## 2023-07-09 ENCOUNTER — Emergency Department (HOSPITAL_BASED_OUTPATIENT_CLINIC_OR_DEPARTMENT_OTHER): Payer: 59

## 2023-07-09 DIAGNOSIS — Z7982 Long term (current) use of aspirin: Secondary | ICD-10-CM | POA: Insufficient documentation

## 2023-07-09 DIAGNOSIS — S20224A Contusion of middle back wall of thorax, initial encounter: Secondary | ICD-10-CM | POA: Diagnosis not present

## 2023-07-09 DIAGNOSIS — S299XXA Unspecified injury of thorax, initial encounter: Secondary | ICD-10-CM | POA: Diagnosis present

## 2023-07-09 DIAGNOSIS — M5127 Other intervertebral disc displacement, lumbosacral region: Secondary | ICD-10-CM | POA: Diagnosis not present

## 2023-07-09 DIAGNOSIS — S2241XA Multiple fractures of ribs, right side, initial encounter for closed fracture: Secondary | ICD-10-CM | POA: Insufficient documentation

## 2023-07-09 DIAGNOSIS — I10 Essential (primary) hypertension: Secondary | ICD-10-CM | POA: Diagnosis not present

## 2023-07-09 DIAGNOSIS — E119 Type 2 diabetes mellitus without complications: Secondary | ICD-10-CM | POA: Insufficient documentation

## 2023-07-09 DIAGNOSIS — Y92002 Bathroom of unspecified non-institutional (private) residence single-family (private) house as the place of occurrence of the external cause: Secondary | ICD-10-CM | POA: Insufficient documentation

## 2023-07-09 DIAGNOSIS — J9811 Atelectasis: Secondary | ICD-10-CM | POA: Diagnosis not present

## 2023-07-09 DIAGNOSIS — Z96611 Presence of right artificial shoulder joint: Secondary | ICD-10-CM | POA: Diagnosis not present

## 2023-07-09 DIAGNOSIS — R079 Chest pain, unspecified: Secondary | ICD-10-CM | POA: Diagnosis not present

## 2023-07-09 DIAGNOSIS — W182XXA Fall in (into) shower or empty bathtub, initial encounter: Secondary | ICD-10-CM | POA: Insufficient documentation

## 2023-07-09 DIAGNOSIS — Z7984 Long term (current) use of oral hypoglycemic drugs: Secondary | ICD-10-CM | POA: Insufficient documentation

## 2023-07-09 DIAGNOSIS — M51369 Other intervertebral disc degeneration, lumbar region without mention of lumbar back pain or lower extremity pain: Secondary | ICD-10-CM | POA: Diagnosis not present

## 2023-07-09 DIAGNOSIS — W19XXXA Unspecified fall, initial encounter: Secondary | ICD-10-CM

## 2023-07-09 MED ORDER — OXYCODONE-ACETAMINOPHEN 5-325 MG PO TABS
1.0000 | ORAL_TABLET | Freq: Once | ORAL | Status: AC
  Administered 2023-07-09: 1 via ORAL
  Filled 2023-07-09: qty 1

## 2023-07-09 MED ORDER — GABAPENTIN 100 MG PO CAPS
100.0000 mg | ORAL_CAPSULE | Freq: Three times a day (TID) | ORAL | 0 refills | Status: DC
Start: 2023-07-09 — End: 2023-09-05

## 2023-07-09 MED ORDER — IBUPROFEN 800 MG PO TABS
800.0000 mg | ORAL_TABLET | Freq: Once | ORAL | Status: AC
  Administered 2023-07-09: 800 mg via ORAL
  Filled 2023-07-09: qty 1

## 2023-07-09 MED ORDER — ACETAMINOPHEN 500 MG PO TABS
1000.0000 mg | ORAL_TABLET | Freq: Once | ORAL | Status: AC
  Administered 2023-07-09: 1000 mg via ORAL
  Filled 2023-07-09: qty 2

## 2023-07-09 MED ORDER — MORPHINE SULFATE 15 MG PO TABS
15.0000 mg | ORAL_TABLET | Freq: Once | ORAL | Status: DC
Start: 2023-07-09 — End: 2023-07-09

## 2023-07-09 MED ORDER — MORPHINE SULFATE 15 MG PO TABS
15.0000 mg | ORAL_TABLET | Freq: Three times a day (TID) | ORAL | 0 refills | Status: AC | PRN
Start: 2023-07-09 — End: 2023-07-14

## 2023-07-09 NOTE — ED Provider Notes (Signed)
[place] EMERGENCY DEPARTMENT AT [facility] Provider Note   CSN: [id number] Arrival date & time: 07/09/23  1222     History  No chief complaint on file.   Joshua [name] is a 62 y.o. male.  With a history of hypertension, angioma, diabetes who presents to the ED for injuries after fall.  Patient was ambulating to the bathroom and did not use his cane.  He attempted to pivot and lost his balance he fell backwards striking his mid back on the tub.  No head trauma or loss of consciousness.  Now with persistent back pain.  No other injuries.  Able to bear weight after the fall.  No anticoagulation  HPI     Home Medications Prior to Admission medications   Medication Sig Start Date End Date Taking? Authorizing Provider  gabapentin (NEURONTIN) 100 MG capsule Take 1 capsule (100 mg total) by mouth 3 (three) times daily for 14 days. 07/09/23 07/23/23 Yes Royanne Foots, DO  morphine (MSIR) 15 MG tablet Take 1 tablet (15 mg total) by mouth every 8 (eight) hours as needed for up to 5 days for severe pain (pain score 7-10). 07/09/23 07/14/23 Yes Royanne Foots, DO  acetaminophen (TYLENOL) 500 MG tablet Take 1,000 mg by mouth every 6 (six) hours as needed for moderate pain or headache.    [provider]  albuterol (VENTOLIN HFA) 108 (90 Base) MCG/ACT inhaler Inhale 2 puffs into the lungs every 6 (six) hours as needed for wheezing or shortness of breath. 08/13/21   Newlin, Enobong, MD  amLODipine (NORVASC) 5 MG tablet TAKE 1 TABLET (5 MG TOTAL) BY MOUTH DAILY. TO LOWER BLOOD PRESSURE. STOP LOSARTAN 08/13/21   Hoy Register, MD  aspirin 81 MG chewable tablet Chew 81 mg by mouth daily.    [provider]  atorvastatin (LIPITOR) 20 MG tablet TAKE 1 TABLET (20 MG TOTAL) BY MOUTH DAILY. 12/08/21   Hoy Register, MD  clonazePAM (KLONOPIN) 0.5 MG tablet TAKE 1 TABLET (0.5 MG TOTAL) BY MOUTH AT BEDTIME. 05/23/23   Vaslow, Georgeanna Lea, MD  lamoTRIgine (LAMICTAL) 200 MG  tablet Take 1 tablet (200 mg total) by mouth 2 (two) times daily. 02/20/23   Vaslow, Georgeanna Lea, MD  lidocaine (LIDODERM) 5 % Place 1 patch onto the skin daily. Remove & Discard patch within 12 hours or as directed by MD 04/07/22   Marita Kansas, PA-C  meclizine (ANTIVERT) 25 MG tablet Take 1 tablet (25 mg total) by mouth 3 (three) times daily as needed for dizziness. 01/04/22   Horton, Clabe Seal, DO  metFORMIN (GLUCOPHAGE) 500 MG tablet Take 1 tablet (500 mg total) by mouth 2 (two) times daily. 08/13/21   Hoy Register, MD  methocarbamol (ROBAXIN) 750 MG tablet Take 750 mg by mouth daily as needed for muscle spasms.    [provider]  omeprazole (PRILOSEC) 20 MG capsule Take 20 mg by mouth daily. 08/29/22   [provider]  propranolol (INDERAL) 10 MG tablet Take 1 tablet (10 mg total) by mouth 3 (three) times daily as needed (palpitations). 05/31/22   Swinyer, Zachary George, NP  tadalafil (CIALIS) 5 MG tablet Take 1 tablet (5 mg total) by mouth daily as needed for erectile dysfunction. 1 hour prior to sexual activity 01/17/22   Sondra Come, MD  tamsulosin (FLOMAX) 0.4 MG CAPS capsule Take 1 capsule (0.4 mg total) by mouth daily. 11/23/21   Michiel Cowboy A, PA-C  VITAMIN A PO Take 1 capsule by  mouth daily.    [provider]      Allergies    Patient has no known allergies.    Review of Systems   Review of Systems  Physical Exam Updated Vital Signs BP 130/83 (BP Location: Left Arm)   Pulse 79   Temp 98.2 F (36.8 C) (Oral)   Resp 17   SpO2 96%  Physical Exam Vitals and nursing note reviewed.  HENT:     Head: Normocephalic and atraumatic.  Eyes:     Pupils: Pupils are equal, round, and reactive to light.  Cardiovascular:     Rate and Rhythm: Normal rate and regular rhythm.  Pulmonary:     Effort: Pulmonary effort is normal.     Breath sounds: Normal breath sounds.  Abdominal:     Palpations: Abdomen is soft.     Tenderness: There is no abdominal tenderness.   Musculoskeletal:     Cervical back: Neck supple. No tenderness.     Comments: Tenderness over right mid back with ecchymosis No midline tenderness No step-off deformities  Skin:    General: Skin is warm and dry.  Neurological:     Mental Status: He is alert.     Comments: 5 out of 5 motor strength bilateral upper and lower extremities with no evidence of trauma  Psychiatric:        Mood and Affect: Mood normal.     ED Results / Procedures / Treatments   Labs (all labs ordered are listed, but only abnormal results are displayed) Labs Reviewed - No data to display  EKG None  Radiology DG Chest [facility] 1 View Result Date: 07/09/2023 CLINICAL DATA:  Pain after a fall. EXAM: PORTABLE CHEST 1 VIEW COMPARISON:  Same day lumbar spine radiographs and CT chest dated 03/30/2023. FINDINGS: The heart size and mediastinal contours are within normal limits. Vascular calcifications are seen in the aortic arch. There is mild bibasilar atelectasis. No pneumothorax. Right-sided rib fractures are less well seen on this exam compared to same day lumbar spine radiographs. A right shoulder arthroplasty is noted. Degenerative changes are seen in the spine. IMPRESSION: Mild bibasilar atelectasis. Electronically Signed   By: Romona Curls M.D.   On: 07/09/2023 15:04   DG Lumbar Spine Complete Result Date: 07/09/2023 CLINICAL DATA:  Fall from standing with back pain. EXAM: LUMBAR SPINE - COMPLETE 4+ VIEW COMPARISON:  Lumbar spine radiographs dated 01/11/2023 and CT abdomen and pelvis dated 03/30/2023. FINDINGS: There is no evidence of lumbar spine fracture. There is 3 mm retrolisthesis of L5 on S1 which appears chronic. There is gentle dextrocurvature centered at L3, similar to prior exam. Multilevel degenerative disc and joint disease is redemonstrated with bridging osteophytes at multiple levels. There are acute fractures of the right tenth and eleventh ribs. IMPRESSION: 1. No evidence of lumbar spine fracture.  2. Acute fractures of the right tenth and eleventh ribs. Electronically Signed   By: Romona Curls M.D.   On: 07/09/2023 14:26    Procedures Procedures    Medications Ordered in ED Medications  morphine (MSIR) tablet 15 mg (has no administration in time range)  acetaminophen (TYLENOL) tablet 1,000 mg (1,000 mg Oral Given 07/09/23 1549)  ibuprofen (ADVIL) tablet 800 mg (800 mg Oral Given 07/09/23 1549)    ED Course/ Medical Decision Making/ A&P  Medical Decision Making 62 year old male with history as above presents after mechanical fall in the bathroom.  Larey Seat backwards and struck his back on the tub.  Now with right-sided mid back pain.  Neurologically intact.  Besides right thoracic tenderness with bruising no evidence of trauma midline tenderness step-off or deformity.  Neck is okay.  No head trauma.  No anticoagulation.  X-rays reveal right-sided rib fractures of 9 and 10.  Nondisplaced.  Pulling 1500 on I-S.  Will provide pain control with morphine.  Will discharge with instruction for over-the-counter pain management with Tylenol ibuprofen, morphine for severe pain, gabapentin for neuropathic pain and to continue I-S at home.  He will follow-up with his PCP.  Stable for discharge  Risk OTC drugs. Prescription drug management.           Final Clinical Impression(s) / ED Diagnoses Final diagnoses:  Closed fracture of multiple ribs of right side, initial encounter  Fall, initial encounter    Rx / DC Orders ED Discharge Orders          Ordered    morphine (MSIR) 15 MG tablet  Every 8 hours PRN        07/09/23 1640    gabapentin (NEURONTIN) 100 MG capsule  3 times daily        07/09/23 1640              Royanne Foots, DO 07/09/23 1641

## 2023-07-09 NOTE — ED Triage Notes (Signed)
Pt lost balance in the bathroom today and fell and hit his lower back on the side of the tub

## 2023-07-09 NOTE — Discharge Instructions (Signed)
You are seen in the emerged part for injuries after a fall You have broken ribs on the right side For this reason we want you to take Tylenol Motrin as directed for mild to moderate pain We have called in a prescription for morphine for you to pick up and take as directed for severe pain only up to every 8 hours Do not drink alcohol or drive while taking morphine We have also called in a prescription for gabapentin which can help with the sort of pain Continue using her incentive spirometer device at home as discussed every few hours to make sure your lungs are inflating otherwise Follow-up with your primary care doctor within 1 week for reevaluation Return to the emerged part for severe pain, trouble breathing any other concerns

## 2023-07-09 NOTE — ED Notes (Signed)
Reviewed AVS/discharge instruction with patient. Time allotted for and all questions answered. Patient is agreeable for d/c and escorted to ed exit by staff.  

## 2023-07-14 DIAGNOSIS — Z9181 History of falling: Secondary | ICD-10-CM | POA: Diagnosis not present

## 2023-07-14 DIAGNOSIS — I10 Essential (primary) hypertension: Secondary | ICD-10-CM | POA: Diagnosis not present

## 2023-07-14 DIAGNOSIS — K219 Gastro-esophageal reflux disease without esophagitis: Secondary | ICD-10-CM | POA: Diagnosis not present

## 2023-07-14 DIAGNOSIS — S2239XA Fracture of one rib, unspecified side, initial encounter for closed fracture: Secondary | ICD-10-CM | POA: Diagnosis not present

## 2023-07-14 DIAGNOSIS — R7303 Prediabetes: Secondary | ICD-10-CM | POA: Diagnosis not present

## 2023-07-14 DIAGNOSIS — F17210 Nicotine dependence, cigarettes, uncomplicated: Secondary | ICD-10-CM | POA: Diagnosis not present

## 2023-07-14 DIAGNOSIS — E7800 Pure hypercholesterolemia, unspecified: Secondary | ICD-10-CM | POA: Diagnosis not present

## 2023-07-21 ENCOUNTER — Telehealth: Payer: Self-pay

## 2023-07-21 NOTE — Progress Notes (Signed)
 Transition Care Management Follow-up Telephone Call Date of discharge and from where: 07/09/2023 [facility] How have you been since you were released from the hospital? Patient stated he is feeling better, but is still sore. Any questions or concerns? No  Items Reviewed: Did the pt receive and understand the discharge instructions provided? Yes  Medications obtained and verified? Yes  Other? No  Any new allergies since your discharge? No  Dietary orders reviewed? Yes Do you have support at home? Yes   Follow up appointments reviewed:  PCP Hospital f/u appt confirmed? No  Scheduled to see  on  @ . Specialist Hospital f/u appt confirmed? Yes  Scheduled to see  on 08/03/2023 @ [facility]. Are transportation arrangements needed? No  If their condition worsens, is the pt aware to call PCP or go to the Emergency Dept.? Yes Was the patient provided with contact information for the PCP's office or ED? Yes Was to pt encouraged to call back with questions or concerns? Yes   Jehad Bisono [facility]  [facility], [facility] Guide Direct Dial : [phone]  Website: [email or web address]

## 2023-07-28 ENCOUNTER — Inpatient Hospital Stay
Admission: RE | Admit: 2023-07-28 | Discharge: 2023-07-28 | Disposition: A | Discharge status: Home / Self Care | Payer: 59 | Source: Ambulatory Visit | Attending: Surgical | Admitting: Surgical

## 2023-07-28 ENCOUNTER — Other Ambulatory Visit: Payer: 59

## 2023-07-30 DIAGNOSIS — F17210 Nicotine dependence, cigarettes, uncomplicated: Secondary | ICD-10-CM | POA: Diagnosis not present

## 2023-07-30 DIAGNOSIS — Z0000 Encounter for general adult medical examination without abnormal findings: Secondary | ICD-10-CM | POA: Diagnosis not present

## 2023-07-30 DIAGNOSIS — M25512 Pain in left shoulder: Secondary | ICD-10-CM | POA: Diagnosis not present

## 2023-07-30 DIAGNOSIS — I10 Essential (primary) hypertension: Secondary | ICD-10-CM | POA: Diagnosis not present

## 2023-07-30 DIAGNOSIS — S2239XA Fracture of one rib, unspecified side, initial encounter for closed fracture: Secondary | ICD-10-CM | POA: Diagnosis not present

## 2023-07-30 DIAGNOSIS — E7800 Pure hypercholesterolemia, unspecified: Secondary | ICD-10-CM | POA: Diagnosis not present

## 2023-07-30 DIAGNOSIS — R7303 Prediabetes: Secondary | ICD-10-CM | POA: Diagnosis not present

## 2023-07-30 DIAGNOSIS — Z9181 History of falling: Secondary | ICD-10-CM | POA: Diagnosis not present

## 2023-07-31 ENCOUNTER — Ambulatory Visit: Payer: 59 | Admitting: Podiatry

## 2023-08-03 ENCOUNTER — Ambulatory Visit (HOSPITAL_BASED_OUTPATIENT_CLINIC_OR_DEPARTMENT_OTHER): Payer: 59

## 2023-08-23 ENCOUNTER — Ambulatory Visit
Admission: RE | Admit: 2023-08-23 | Discharge: 2023-08-23 | Discharge status: Home / Self Care | Payer: 59 | Source: Ambulatory Visit | Attending: Surgical

## 2023-08-23 ENCOUNTER — Ambulatory Visit
Admission: RE | Admit: 2023-08-23 | Discharge: 2023-08-23 | Disposition: A | Discharge status: Home / Self Care | Payer: 59 | Source: Ambulatory Visit | Attending: Surgical | Admitting: Surgical

## 2023-08-23 DIAGNOSIS — M7582 Other shoulder lesions, left shoulder: Secondary | ICD-10-CM | POA: Diagnosis not present

## 2023-08-23 DIAGNOSIS — M25512 Pain in left shoulder: Secondary | ICD-10-CM | POA: Diagnosis not present

## 2023-08-23 DIAGNOSIS — M19012 Primary osteoarthritis, left shoulder: Secondary | ICD-10-CM

## 2023-08-23 MED ORDER — IOPAMIDOL (ISOVUE-M 200) INJECTION 41%
10.0000 mL | Freq: Once | INTRAMUSCULAR | Status: AC
  Administered 2023-08-23: 10 mL via INTRA_ARTICULAR

## 2023-09-04 ENCOUNTER — Other Ambulatory Visit: Payer: Self-pay | Admitting: Internal Medicine

## 2023-09-04 ENCOUNTER — Encounter: Payer: Self-pay | Admitting: Orthopedic Surgery

## 2023-09-04 ENCOUNTER — Ambulatory Visit (INDEPENDENT_AMBULATORY_CARE_PROVIDER_SITE_OTHER): Payer: 59 | Admitting: Orthopedic Surgery

## 2023-09-04 ENCOUNTER — Telehealth: Payer: Self-pay

## 2023-09-04 DIAGNOSIS — M75122 Complete rotator cuff tear or rupture of left shoulder, not specified as traumatic: Secondary | ICD-10-CM

## 2023-09-04 MED ORDER — HYDROCODONE-ACETAMINOPHEN 5-325 MG PO TABS
1.0000 | ORAL_TABLET | Freq: Four times a day (QID) | ORAL | 0 refills | Status: DC | PRN
Start: 2023-09-04 — End: 2023-09-12

## 2023-09-04 NOTE — Telephone Encounter (Signed)
Patient called stating he was at pharmacy waiting for his medication. Advised you had submitted.

## 2023-09-04 NOTE — Telephone Encounter (Signed)
 Thank you very much

## 2023-09-04 NOTE — Progress Notes (Signed)
Office Visit Note   Patient: Joshua [name]           Date of Birth: 1961/03/29           MRN: [id number] Visit Date: 09/04/2023 Requested by: No referring provider defined for this encounter. PCP: Pcp, No  Subjective: Chief Complaint  Patient presents with   Left Shoulder - Follow-up    Review MRI    HPI: Joshua [name] is a 63 y.o. male who presents to the office reporting continued left shoulder pain.  Since he was last seen has had an MRI scan which shows 1 cm retracted tear of the supraspinatus.  No evidence of frozen shoulder in the inferior capsule.  Patient reports both pain and weakness.  He describes grinding with overhead motion.  He is having a lot of difficulty sleeping.  Has been taking anti-inflammatories and muscle relaxer as well as using topical cream.  3 months ago he had a subacromial injection which helped him only for a week.  He is right-hand dominant and uses a cane in that right-hand side.  He is a retired Administrator, [employer] but he likes to fish and he states that he cannot really pick up his grand children at this time..                ROS: All systems reviewed are negative as they relate to the chief complaint within the history of present illness.  Patient denies fevers or chills.  Assessment & Plan: Visit Diagnoses: No diagnosis found.  Plan: Impression is left shoulder rotator cuff tear.  He is done well with his right reverse shoulder replacement.  Plan is left shoulder arthroscopy with debridement biceps tenodesis and rotator cuff tear repair.  The risk and benefits are discussed with the patient including not limited to infection or vessel damage incomplete pain relief as well as incomplete return of function.  The expected rehabilitation time is also discussed.  All questions answered.  Norco prescribed today to be taken on a infrequent basis.  Follow-Up Instructions: No follow-ups on file.   Orders:  No orders of the defined types were placed in  this encounter.  No orders of the defined types were placed in this encounter.     Procedures: No procedures performed   Clinical Data: No additional findings.  Objective: Vital Signs: There were no vitals taken for this visit.  Physical Exam:  Constitutional: Patient appears well-developed HEENT:  Head: Normocephalic Eyes:EOM are normal Neck: Normal range of motion Cardiovascular: Normal rate Pulmonary/chest: Effort normal Neurologic: Patient is alert Skin: Skin is warm Psychiatric: Patient has normal mood and affect  Ortho Exam: Ortho exam demonstrates on the left-hand side some crepitus with internal/external rotation 9 degrees of abduction.  Motor or sensory function the hand is intact.  Radial pulses intact.  Patient is slightly weaker external rotation strength on the left compared to the right.  Does have positive O'Brien's testing and positive bicipital groove tenderness on the left and positive speeds testing.  No discrete AC joint tenderness is present.  Cervical spine range of motion is full.  Specialty Comments:  Narrative & Impression CLINICAL DATA:  Low back pain and bilateral hip pain with extension down the left leg. Six months duration. Symptoms worsened after a fall 2 months ago.   EXAM: MRI LUMBAR SPINE WITHOUT CONTRAST   TECHNIQUE: Multiplanar, multisequence MR imaging of the lumbar spine was performed. No intravenous contrast was administered.   COMPARISON:  Radiography 01/11/2023  FINDINGS: Segmentation:  5 lumbar type vertebral bodies.   Alignment: Straightening of the normal lumbar lordosis. Scoliotic curvature convex to the right with the apex at L3.   Vertebrae: No fracture in the lumbar region. Abnormal edema visible at the edge of the study at the S3 level consistent with a recent sacral fracture. This was not evaluated completely. Benign appearing hemangioma within the left side of the L4 vertebral body.   Conus medullaris and  cauda equina: Conus extends to the L1 level. Conus and cauda equina appear normal.   Paraspinal and other soft tissues: Negative   Disc levels:   T11-12, T12-L1 and L1-2: No disc pathology. Right-sided osteophytes at the L1-2 level do encroach somewhat of upon the extraforaminal region but the significance is doubtful.   L2-3: Prominent right lateral bridging osteophytes. Right foraminal to extraforaminal encroachment could possibly affect the right L2 nerve, but finding is obviously chronic.   L3-4: Similar prominent right-sided osteophytes with encroachment upon the right lateral foraminal to extraforaminal region.   L4-5: Bulging of the discs with lateral osteophytes prominent on the left. Mild facet and ligamentous hypertrophy. Mild narrowing of the lateral recesses but no compression of the nerves within the canal. The left-sided osteophytes could possibly affect the L4 nerve in the extra-spinal region.   L5-S1: Endplate osteophytes and bulging of the disc. Bilateral facet osteoarthritis. No canal or lateral recess stenosis. Bilateral foraminal encroachment could affect either L5 nerve. The facet arthritis could be painful.   IMPRESSION: 1. Abnormal edema at the edge of the study at the S3 level consistent with a recent sacral fracture. This was not evaluated completely. 2. Scoliotic curvature convex to the right with the apex at L3. 3. Right-sided bridging osteophytes at L2-3 and L3-4 could possibly affect the right-sided exiting nerves. Finding is obviously chronic. 4. Left-sided osteophytes at L4-5 could possibly affect the left L4 nerve in the extra-spinal region. This also appears chronic. 5. Bilateral foraminal stenosis due to osteophytes, bulging of the disc and facet arthropathy at L5-S1 that could affect either L5 nerve. The facet arthritis could be painful.  Imaging: No results found.   PMFS History: Patient Active Problem List   Diagnosis Date Noted    History of colonic polyps 04/10/2023   Acute prostatitis 04/01/2023   DM (diabetes mellitus) (HCC) 03/31/2023   Demand ischemia of myocardium (HCC) 03/31/2023   Severe sepsis (HCC) 03/30/2023   Elevated troponin 03/30/2023   Paroxysmal SVT (supraventricular tachycardia) (HCC) 03/30/2023   High anion gap metabolic acidosis 03/30/2023   Hematuria 03/30/2023   S/p prostate biopsy 03/29/23 03/30/2023   Hypotension 03/30/2023   History of recurrent meningioma s/p resection x2 03/30/2023   Aortic atherosclerosis (HCC) 10/20/2022   Pain in limb 06/14/2022   Pain due to onychomycosis of toenails of both feet 03/04/2021   Arthritis of right shoulder region    S/P reverse total shoulder arthroplasty, 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   Essential hypertension 07/02/2020   Prediabetes 07/02/2020   Polyuria 07/02/2020   Nontraumatic complete tear of right rotator cuff    Impingement syndrome of right shoulder    Neuropathy 12/05/2019   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   Seizures (HCC) 01/31/2019   Meningioma, recurrent of brain (HCC) 01/31/2019   Meningioma (HCC) 01/03/2019   Diffuse idiopathic skeletal hyperostosis 06/05/2013   Past Medical History:  Diagnosis Date   Anxiety    Brain tumor (HCC) 02/20/2013   brain tumor removed in March 2014, Dr Rush Farmer  Diffuse idiopathic skeletal hyperostosis 06/05/2013   Dizziness    Enlarged prostate    GERD (gastroesophageal reflux disease)    Headache(784.0)    scattered   High cholesterol    Hypertension    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   Meningioma (HCC)    Meningioma, recurrent of brain (HCC) 01/31/2019   Neuropathy 12/05/2019   Pre-diabetes    Rotator cuff tear    right   Seizures (HCC)    11/27/19 last sz 1 wk ago    Family History  Problem Relation Age of Onset   Hypertension Mother    Dementia Mother    Diabetes Mother    Hypertension Father    CAD Father    Diabetes Father    CAD  Brother     Past Surgical History:  Procedure Laterality Date   APPLICATION OF CRANIAL NAVIGATION N/A 01/10/2019   Procedure: APPLICATION OF CRANIAL NAVIGATION;  Surgeon: Maeola Harman, MD;  Location: [facility] OR;  Service: Neurosurgery;  Laterality: N/A;   CATARACT EXTRACTION Right    COLONOSCOPY WITH PROPOFOL N/A 04/10/2023   Procedure: COLONOSCOPY WITH PROPOFOL;  Surgeon: Toney Reil, MD;  Location: [facility];  Service: Gastroenterology;  Laterality: N/A;   CRANIOTOMY Right 11/06/2012   Procedure: CRANIOTOMY TUMOR EXCISION;  Surgeon: Maeola Harman, MD;  Location: [facility] OR[facility];  Service: Neurosurgery;  Laterality: Right;  Right Parasagittal craniotomy for meningioma with Stealth   CRANIOTOMY Right 01/10/2019   Procedure: Right Parasagittal Craniotomy for Tumor;  Surgeon: Maeola Harman, MD;  Location: [facility] OR;  Service: Neurosurgery;  Laterality: Right;  Right parasagittal craniotomy for tumor   ESOPHAGOGASTRODUODENOSCOPY     JOINT REPLACEMENT Right 2018   right hip   REVERSE SHOULDER ARTHROPLASTY Right 01/08/2021   Procedure: RIGHT REVERSE SHOULDER ARTHROPLASTY;  Surgeon: Cammy Copa, MD;  Location: [facility] OR;  Service: Orthopedics;  Laterality: Right;   right knee arthroscopy     SHOULDER ARTHROSCOPY Right 06/09/2020   Procedure: RIGHT SHOULDER ARTHROSCOPY AND DEBRIDEMENT;  Surgeon: Nadara Mustard, MD;  Location: [facility];  Service: Orthopedics;  Laterality: Right;   Social History   Occupational History   Not on file  Tobacco Use   Smoking status: Former    Current packs/day: 0.00    Average packs/day: (0.2 ttl pk-yrs)    Types: Cigarettes    Start date: 01/29/2004    Quit date: 01/29/2019    Years since quitting: 4.6    Passive exposure: Past   Smokeless tobacco: Never   Tobacco comments:    weekend smoker  Vaping Use   Vaping status: Some Days   Substances: Nicotine, Flavoring  Substance and Sexual Activity   Alcohol use: Yes    Comment: social    Drug use: No   Sexual activity: Not Currently

## 2023-09-06 NOTE — Pre-Procedure Instructions (Signed)
Surgical Instructions   Your procedure is scheduled on September 12, 2023. Report to [facility] Main Entrance "A" at 5:30 A.M., then check in with the Admitting office. Any questions or running late day of surgery: call [phone]  Questions prior to your surgery date: call [phone], Monday-Friday, 8am-4pm. If you experience any cold or flu symptoms such as cough, fever, chills, shortness of breath, etc. between now and your scheduled surgery, please notify us at the above number.     Remember:  Do not eat after midnight the night before your surgery   You may drink clear liquids until 4:30 AM the morning of your surgery.   Clear liquids allowed are: Water, Non-Citrus Juices (without pulp), Carbonated Beverages, Clear Tea (no milk, honey, etc.), Black Coffee Only (NO MILK, CREAM OR POWDERED CREAMER of any kind), and Gatorade.    Take these medicines the morning of surgery with A SIP OF WATER: amLODipine (NORVASC)  atorvastatin (LIPITOR)  lamoTRIgine (LAMICTAL)  omeprazole (PRILOSEC)  tamsulosin [facility])    May take these medicines IF NEEDED: acetaminophen (TYLENOL)  albuterol (VENTOLIN HFA) inhaler - please bring inhaler with you morning of surgery clonazePAM (KLONOPIN)  gabapentin (NEURONTIN)  HYDROcodone-acetaminophen (NORCO/VICODIN)  meclizine (ANTIVERT)  methocarbamol (ROBAXIN)  propranolol (INDERAL)    DO NOT take your metFORMIN (GLUCOPHAGE) morning of surgery.   Follow your surgeon's instructions on when to stop Aspirin.  If no instructions were given by your surgeon then you will need to call the office to get those instructions.     One week prior to surgery, STOP taking any Aleve, Naproxen, Ibuprofen, Motrin, Advil, Goody's, BC's, all herbal medications, fish oil, and non-prescription vitamins.                     Do NOT Smoke (Tobacco/Vaping) for 24 hours prior to your procedure.  If you use a CPAP at night, you may bring your mask/headgear for your  overnight stay.   You will be asked to remove any contacts, glasses, piercing's, hearing aid's, dentures/partials prior to surgery. Please bring cases for these items if needed.    Patients discharged the day of surgery will not be allowed to drive home, and someone needs to stay with them for 24 hours.  SURGICAL WAITING ROOM VISITATION Patients may have no more than 2 support people in the waiting area - these visitors may rotate.   Pre-op nurse will coordinate an appropriate time for 1 ADULT support person, who may not rotate, to accompany patient in pre-op.  Children under the age of 14 must have an adult with them who is not the patient and must remain in the main waiting area with an adult.  If the patient needs to stay at the hospital during part of their recovery, the visitor guidelines for inpatient rooms apply.  Please refer to the [facility] website for the visitor guidelines for any additional information.   If you received a COVID test during your pre-op visit  it is requested that you wear a mask when out in public, stay away from anyone that may not be feeling well and notify your surgeon if you develop symptoms. If you have been in contact with anyone that has tested positive in the last 10 days please notify you surgeon.      Pre-operative CHG Bathing Instructions   You can play a key role in reducing the risk of infection after surgery. Your skin needs to be as free of germs as possible.  You can reduce the number of germs on your skin by washing with CHG (chlorhexidine gluconate) soap before surgery. CHG is an antiseptic soap that kills germs and continues to kill germs even after washing.   DO NOT use if you have an allergy to chlorhexidine/CHG or antibacterial soaps. If your skin becomes reddened or irritated, stop using the CHG and notify one of our RNs at [phone].              TAKE A SHOWER THE NIGHT BEFORE SURGERY AND THE DAY OF SURGERY    Please keep in mind  the following:  DO NOT shave, including legs and underarms, 48 hours prior to surgery.   You may shave your face before/day of surgery.  Place clean sheets on your bed the night before surgery Use a clean washcloth (not used since being washed) for each shower. DO NOT sleep with pet's night before surgery.  CHG Shower Instructions:  Wash your face and private area with normal soap. If you choose to wash your hair, wash first with your normal shampoo.  After you use shampoo/soap, rinse your hair and body thoroughly to remove shampoo/soap residue.  Turn the water OFF and apply half the bottle of CHG soap to a CLEAN washcloth.  Apply CHG soap ONLY FROM YOUR NECK DOWN TO YOUR TOES (washing for 3-5 minutes)  DO NOT use CHG soap on face, private areas, open wounds, or sores.  Pay special attention to the area where your surgery is being performed.  If you are having back surgery, having someone wash your back for you may be helpful. Wait 2 minutes after CHG soap is applied, then you may rinse off the CHG soap.  Pat dry with a clean towel  Put on clean pajamas    Additional instructions for the day of surgery: DO NOT APPLY any lotions, deodorants, cologne, or perfumes.   Do not wear jewelry or makeup Do not wear nail polish, gel polish, artificial nails, or any other type of covering on natural nails (fingers and toes) Do not bring valuables to the hospital. [facility] is not responsible for valuables/personal belongings. Put on clean/comfortable clothes.  Please brush your teeth.  Ask your nurse before applying any prescription medications to the skin.

## 2023-09-07 ENCOUNTER — Encounter (HOSPITAL_COMMUNITY)
Admission: RE | Admit: 2023-09-07 | Discharge: 2023-09-07 | Disposition: A | Discharge status: Home / Self Care | Payer: 59 | Source: Ambulatory Visit | Attending: Orthopedic Surgery | Admitting: Orthopedic Surgery

## 2023-09-07 ENCOUNTER — Encounter (HOSPITAL_COMMUNITY): Payer: Self-pay

## 2023-09-07 ENCOUNTER — Other Ambulatory Visit: Payer: Self-pay

## 2023-09-07 VITALS — BP 132/76 | HR 63 | Temp 98.2°F | Resp 17 | Ht 64.0 in | Wt 187.1 lb

## 2023-09-07 DIAGNOSIS — R002 Palpitations: Secondary | ICD-10-CM | POA: Insufficient documentation

## 2023-09-07 DIAGNOSIS — Z8249 Family history of ischemic heart disease and other diseases of the circulatory system: Secondary | ICD-10-CM | POA: Insufficient documentation

## 2023-09-07 DIAGNOSIS — Z01812 Encounter for preprocedural laboratory examination: Secondary | ICD-10-CM | POA: Insufficient documentation

## 2023-09-07 DIAGNOSIS — E785 Hyperlipidemia, unspecified: Secondary | ICD-10-CM | POA: Diagnosis not present

## 2023-09-07 DIAGNOSIS — Z79899 Other long term (current) drug therapy: Secondary | ICD-10-CM | POA: Diagnosis not present

## 2023-09-07 DIAGNOSIS — G4089 Other seizures: Secondary | ICD-10-CM | POA: Diagnosis not present

## 2023-09-07 DIAGNOSIS — K219 Gastro-esophageal reflux disease without esophagitis: Secondary | ICD-10-CM | POA: Insufficient documentation

## 2023-09-07 DIAGNOSIS — E119 Type 2 diabetes mellitus without complications: Secondary | ICD-10-CM | POA: Insufficient documentation

## 2023-09-07 DIAGNOSIS — I10 Essential (primary) hypertension: Secondary | ICD-10-CM | POA: Diagnosis not present

## 2023-09-07 DIAGNOSIS — I700 Atherosclerosis of aorta: Secondary | ICD-10-CM | POA: Insufficient documentation

## 2023-09-07 DIAGNOSIS — Z86011 Personal history of benign neoplasm of the brain: Secondary | ICD-10-CM | POA: Diagnosis not present

## 2023-09-07 DIAGNOSIS — R42 Dizziness and giddiness: Secondary | ICD-10-CM | POA: Diagnosis not present

## 2023-09-07 DIAGNOSIS — Z01818 Encounter for other preprocedural examination: Secondary | ICD-10-CM

## 2023-09-07 HISTORY — DX: Atherosclerosis of aorta: I70.0

## 2023-09-07 HISTORY — DX: Type 2 diabetes mellitus without complications: E11.9

## 2023-09-07 LAB — CBC
HCT: 45.3 % (ref 39.0–52.0)
Hemoglobin: 15.6 g/dL (ref 13.0–17.0)
MCH: 31.4 pg (ref 26.0–34.0)
MCHC: 34.4 g/dL (ref 30.0–36.0)
MCV: 91.1 fL (ref 80.0–100.0)
Platelets: 251 10*3/uL (ref 150–400)
RBC: 4.97 MIL/uL (ref 4.22–5.81)
RDW: 12.5 % (ref 11.5–15.5)
WBC: 6 10*3/uL (ref 4.0–10.5)
nRBC: 0 % (ref 0.0–0.2)

## 2023-09-07 LAB — HEMOGLOBIN A1C
Hgb A1c MFr Bld: 6 % — ABNORMAL HIGH (ref 4.8–5.6)
Mean Plasma Glucose: 125.5 mg/dL

## 2023-09-07 LAB — BASIC METABOLIC PANEL
Anion gap: 11 (ref 5–15)
BUN: 11 mg/dL (ref 8–23)
CO2: 24 mmol/L (ref 22–32)
Calcium: 9.8 mg/dL (ref 8.9–10.3)
Chloride: 106 mmol/L (ref 98–111)
Creatinine, Ser: 0.85 mg/dL (ref 0.61–1.24)
GFR, Estimated: >60 mL/min (ref >60)
Glucose, Bld: 108 mg/dL — ABNORMAL HIGH (ref 70–99)
Potassium: 4.1 mmol/L (ref 3.5–5.1)
Sodium: 141 mmol/L (ref 135–145)

## 2023-09-07 LAB — GLUCOSE, CAPILLARY: Glucose-Capillary: 106 mg/dL — ABNORMAL HIGH (ref 70–99)

## 2023-09-07 NOTE — Pre-Procedure Instructions (Addendum)
Surgical Instructions   Your procedure is scheduled on September 12, 2023. Report to [facility] Main Entrance "A" at 5:30 A.M., then check in with the Admitting office. Any questions or running late day of surgery: call [phone]  Questions prior to your surgery date: call [phone], Monday-Friday, 8am-4pm. If you experience any cold or flu symptoms such as cough, fever, chills, shortness of breath, etc. between now and your scheduled surgery, please notify us at the above number.     Remember:  Do not eat after midnight the night before your surgery   You may drink clear liquids until 4:30 AM the morning of your surgery.   Clear liquids allowed are: Water, Non-Citrus Juices (without pulp), Carbonated Beverages, Clear Tea (no milk, honey, etc.), Black Coffee Only (NO MILK, CREAM OR POWDERED CREAMER of any kind), and Gatorade.  Patient Instructions  The night before surgery:  No food after midnight. ONLY clear liquids after midnight   The day of surgery (if you have diabetes): Drink ONE (1) 12 oz G2 given to you in your pre admission testing appointment by 04:30 AM the morning of surgery. Drink in one sitting. Do not sip.  This drink was given to you during your hospital  pre-op appointment visit.  Nothing else to drink after completing the  12 oz bottle of G2.         If you have questions, please contact your surgeon's office.    Take these medicines the morning of surgery with A SIP OF WATER: amLODipine (NORVASC)  atorvastatin (LIPITOR)  lamoTRIgine (LAMICTAL)  omeprazole (PRILOSEC)  tamsulosin [facility])    May take these medicines IF NEEDED: acetaminophen (TYLENOL)  albuterol (VENTOLIN HFA) inhaler - please bring inhaler with you morning of surgery clonazePAM (KLONOPIN)  gabapentin (NEURONTIN)  HYDROcodone-acetaminophen (NORCO/VICODIN)  meclizine (ANTIVERT)  methocarbamol (ROBAXIN)  propranolol (INDERAL)   WHAT DO I DO ABOUT MY DIABETES MEDICATION?   Do  not take metFORMIN (GLUCOPHAGE) the morning of surgery.   HOW TO MANAGE YOUR DIABETES BEFORE AND AFTER SURGERY  Why is it important to control my blood sugar before and after surgery? Improving blood sugar levels before and after surgery helps healing and can limit problems. A way of improving blood sugar control is eating a healthy diet by:  Eating less sugar and carbohydrates  Increasing activity/exercise  Talking with your doctor about reaching your blood sugar goals High blood sugars (greater than 180 mg/dL) can raise your risk of infections and slow your recovery, so you will need to focus on controlling your diabetes during the weeks before surgery. Make sure that the doctor who takes care of your diabetes knows about your planned surgery including the date and location.  How do I manage my blood sugar before surgery? Check your blood sugar at least 4 times a day, starting 2 days before surgery, to make sure that the level is not too high or low.  Check your blood sugar the morning of your surgery when you wake up and every 2 hours until you get to the Short Stay unit.  If your blood sugar is less than 70 mg/dL, you will need to treat for low blood sugar: Do not take insulin. Treat a low blood sugar (less than 70 mg/dL) with  cup of clear juice (cranberry or apple), 4 glucose tablets, OR glucose gel. Recheck blood sugar in 15 minutes after treatment (to make sure it is greater than 70 mg/dL). If your blood sugar is not greater than 70  mg/dL on recheck, call [phone] for further instructions. Report your blood sugar to the short stay nurse when you get to Short Stay.  If you are admitted to the hospital after surgery: Your blood sugar will be checked by the staff and you will probably be given insulin after surgery (instead of oral diabetes medicines) to make sure you have good blood sugar levels. The goal for blood sugar control after surgery is 80-180 mg/dL.     Follow  your surgeon's instructions on when to stop Aspirin.  If no instructions were given by your surgeon then you will need to call the office to get those instructions.     One week prior to surgery, STOP taking any Aleve, Naproxen, Ibuprofen, Motrin, Advil, Goody's, BC's, all herbal medications, fish oil, and non-prescription vitamins.                     Do NOT Smoke (Tobacco/Vaping) for 24 hours prior to your procedure.  If you use a CPAP at night, you may bring your mask/headgear for your overnight stay.   You will be asked to remove any contacts, glasses, piercing's, hearing aid's, dentures/partials prior to surgery. Please bring cases for these items if needed.    Patients discharged the day of surgery will not be allowed to drive home, and someone needs to stay with them for 24 hours.  SURGICAL WAITING ROOM VISITATION Patients may have no more than 2 support people in the waiting area - these visitors may rotate.   Pre-op nurse will coordinate an appropriate time for 1 ADULT support person, who may not rotate, to accompany patient in pre-op.  Children under the age of 26 must have an adult with them who is not the patient and must remain in the main waiting area with an adult.  If the patient needs to stay at the hospital during part of their recovery, the visitor guidelines for inpatient rooms apply.  Please refer to the [facility] website for the visitor guidelines for any additional information.   If you received a COVID test during your pre-op visit  it is requested that you wear a mask when out in public, stay away from anyone that may not be feeling well and notify your surgeon if you develop symptoms. If you have been in contact with anyone that has tested positive in the last 10 days please notify you surgeon.      Pre-operative CHG Bathing Instructions   You can play a key role in reducing the risk of infection after surgery. Your skin needs to be as free of germs as  possible. You can reduce the number of germs on your skin by washing with CHG (chlorhexidine gluconate) soap before surgery. CHG is an antiseptic soap that kills germs and continues to kill germs even after washing.   DO NOT use if you have an allergy to chlorhexidine/CHG or antibacterial soaps. If your skin becomes reddened or irritated, stop using the CHG and notify one of our RNs at [phone].              TAKE A SHOWER THE NIGHT BEFORE SURGERY AND THE DAY OF SURGERY    Please keep in mind the following:  DO NOT shave, including legs and underarms, 48 hours prior to surgery.   You may shave your face before/day of surgery.  Place clean sheets on your bed the night before surgery Use a clean washcloth (not used since being washed) for each  shower. DO NOT sleep with pet's night before surgery.  CHG Shower Instructions:  Wash your face and private area with normal soap. If you choose to wash your hair, wash first with your normal shampoo.  After you use shampoo/soap, rinse your hair and body thoroughly to remove shampoo/soap residue.  Turn the water OFF and apply half the bottle of CHG soap to a CLEAN washcloth.  Apply CHG soap ONLY FROM YOUR NECK DOWN TO YOUR TOES (washing for 3-5 minutes)  DO NOT use CHG soap on face, private areas, open wounds, or sores.  Pay special attention to the area where your surgery is being performed.  If you are having back surgery, having someone wash your back for you may be helpful. Wait 2 minutes after CHG soap is applied, then you may rinse off the CHG soap.  Pat dry with a clean towel  Put on clean pajamas    Additional instructions for the day of surgery: DO NOT APPLY any lotions, deodorants, cologne, or perfumes.   Do not wear jewelry or makeup Do not wear nail polish, gel polish, artificial nails, or any other type of covering on natural nails (fingers and toes) Do not bring valuables to the hospital. [facility] is not responsible for  valuables/personal belongings. Put on clean/comfortable clothes.  Please brush your teeth.  Ask your nurse before applying any prescription medications to the skin   Oral Hygiene is also important to reduce your risk of infection.  Remember - BRUSH YOUR TEETH THE MORNING OF SURGERY WITH YOUR REGULAR TOOTHPASTE  [place]- Preparing for Total Shoulder Arthroplasty  Before surgery, you can play an important role. Because skin is not sterile, your skin needs to be as free of germs as possible. You can reduce the number of germs on your skin by using the following products.   Benzoyl Peroxide Gel  o Reduces the number of germs present on the skin  o Applied twice a day to shoulder area starting two days before surgery   ==================================================================  Please follow these instructions carefully:  BENZOYL PEROXIDE 5% GEL  Please do not use if you have an allergy to benzoyl peroxide. If your skin becomes reddened/irritated stop using the benzoyl peroxide.  Starting two days before surgery, apply as follows:  1. Apply benzoyl peroxide in the morning and at night. Apply after taking a shower. If you are not taking a shower clean entire shoulder front, back, and side along with the armpit with a clean wet washcloth.  2. Place a quarter-sized dollop on your SHOULDER and rub in thoroughly, making sure to cover the front, back, and side of your shoulder, along with the armpit.   2 Days prior to Surgery First Dose on _____________ Morning Second Dose on ______________ Night  Day Before Surgery First Dose on ______________ Morning Night before surgery wash (entire body except face and private areas) with CHG Soap THEN Second Dose on ____________ Night   Morning of Surgery  wash BODY AGAIN with CHG Soap   4. Do NOT apply benzoyl peroxide gel on the day of surgery

## 2023-09-07 NOTE — Progress Notes (Addendum)
PCP - Stefani Dama, PA-C Cardiologist - Dr. Armanda Magic  PPM/ICD - denies   Chest x-ray - 07/09/23 EKG - 03/30/23 Stress Test - 12/18/19 ECHO - 04/01/23 Cardiac Cath - denies  Sleep Study - denies   Fasting Blood Sugar - 90-100 Checks Blood Sugar once a day  Last dose of GLP1 agonist-  n/a   Blood Thinner Instructions: n/a Aspirin Instructions: f/u with surgeon  ERAS Protcol - yes PRE-SURGERY G2- given at PAT  COVID TEST- n/a   Anesthesia review: yes, cardiac hx. Pt had a fall in December. Had a right sided rib fracture to 9 and 10. He says he is "on the mend" now but still has some discomfort.  Patient denies shortness of breath, fever, cough and chest pain at PAT appointment   All instructions explained to the patient, with a verbal understanding of the material. Patient agrees to go over the instructions while at home for a better understanding. The opportunity to ask questions was provided.

## 2023-09-08 NOTE — Progress Notes (Addendum)
Anesthesia Chart Review:  63 yo male follows with cardiology for hx of HLD, HTN, Palpitations, Aortic atherosclerosis, fam hx of premature CAD. Lexiscan Myoview 2021 showed no ischemia. Coronary calcium score 2021 was 0. Event monitor 2023 was benign with only rare PACs. Echo 03/2023 showed EF 55-60%, grade 1 dd, mild MR. EKGs have shown iRBBB in the past. Last seen by Dr. Mayford Knife 07/06/23, stable at that time, no changes to management. Recommended 33yr followup with repeat coronary calcium score.  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 Maintained on vimpat and lamictal.  Other pertinent hx includes GERD, headaches, dizziness.   Preop labs reviewed, WNL. 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  EKG 03/30/23 (in setting of sepsis after prostate biopsy): Sinus tachycardia. Rate 115. Incomplete right bundle branch block. Borderline ST elevation, lateral leads  TTE 04/01/23: 1. Left ventricular ejection fraction, by estimation, is 55 to 60%. Left  ventricular ejection fraction by PLAX is 57 %. The left ventricle has  normal function. The left ventricle has no regional wall motion  abnormalities. There is mild left ventricular  hypertrophy. Left ventricular diastolic parameters are consistent with  Grade I diastolic dysfunction (impaired relaxation). The average left  ventricular global longitudinal strain is -17.0 %. The global longitudinal  strain is normal.   2. Right ventricular systolic function is normal. The right ventricular  size is normal. There is normal pulmonary artery systolic pressure.   3. The mitral valve is normal in structure. Mild mitral valve  regurgitation.   4. The aortic valve is tricuspid. Aortic valve regurgitation is not  visualized.   5. The inferior vena cava is dilated in size with >50% respiratory  variability, suggesting right atrial pressure of 8 mmHg.   Nuclear stress 12/18/19: Nuclear stress EF:  60%. There was no ST segment deviation noted during stress. No T wave inversion was noted during stress. The study is normal. This is a low risk study. The left ventricular ejection fraction is normal (55-65%).   Zannie Cove [facility] Short Stay Center/Anesthesiology Phone [phone] 09/08/2023 10:20 AM

## 2023-09-08 NOTE — Anesthesia Preprocedure Evaluation (Addendum)
 Anesthesia Evaluation  Patient identified by MRN, date of birth, ID band Patient awake    Reviewed: Allergy & Precautions, H&P , NPO status , Patient's Chart, lab work & pertinent test results  Airway Mallampati: II  TM Distance: >3 FB Neck ROM: Full    Dental no notable dental hx.    Pulmonary former smoker   Pulmonary exam normal breath sounds clear to auscultation       Cardiovascular hypertension, Normal cardiovascular exam Rhythm:Regular Rate:Normal  IMPRESSIONS     1. Left ventricular ejection fraction, by estimation, is 55 to 60%. Left  ventricular ejection fraction by PLAX is 57 %. The left ventricle has  normal function. The left ventricle has no regional wall motion  abnormalities. There is mild left ventricular  hypertrophy. Left ventricular diastolic parameters are consistent with  Grade I diastolic dysfunction (impaired relaxation). The average left  ventricular global longitudinal strain is -17.0 %. The global longitudinal  strain is normal.   2. Right ventricular systolic function is normal. The right ventricular  size is normal. There is normal pulmonary artery systolic pressure.   3. The mitral valve is normal in structure. Mild mitral valve  regurgitation.   4. The aortic valve is tricuspid. Aortic valve regurgitation is not  visualized.   5. The inferior vena cava is dilated in size with >50% respiratory  variability, suggesting right atrial pressure of 8 mmHg.     Neuro/Psych  Headaches, Seizures -,  PSYCHIATRIC DISORDERS Anxiety     Intracranial mass s/p resection 2014    GI/Hepatic Neg liver ROS,GERD  ,,  Endo/Other  negative endocrine ROSdiabetes, Type 2    Renal/GU negative Renal ROS  negative genitourinary   Musculoskeletal  (+) Arthritis ,  left rotator cuff tear, biceps tendinosis   Abdominal   Peds negative pediatric ROS (+)  Hematology negative hematology ROS (+)   Anesthesia  Other Findings   Reproductive/Obstetrics negative OB ROS                              Anesthesia Physical Anesthesia Plan  ASA: 3  Anesthesia Plan: General   Post-op Pain Management: Tylenol PO (pre-op)* and Regional block*   Induction: Intravenous  PONV Risk Score and Plan: 2 and Ondansetron, Dexamethasone and Treatment may vary due to age or medical condition  Airway Management Planned: Oral ETT  Additional Equipment:   Intra-op Plan:   Post-operative Plan: Extubation in OR  Informed Consent: I have reviewed the patients History and Physical, chart, labs and discussed the procedure including the risks, benefits and alternatives for the proposed anesthesia with the patient or authorized representative who has indicated his/her understanding and acceptance.     Dental advisory given  Plan Discussed with: CRNA  Anesthesia Plan Comments: (PAT note by Antionette Poles, PA-C:  63 yo male follows with cardiology for hx of HLD, HTN, Palpitations, Aortic atherosclerosis, fam hx of premature CAD. Lexiscan Myoview 2021 showed no ischemia. Coronary calcium score 2021 was 0. Event monitor 2023 was benign with only rare PACs. Echo 03/2023 showed EF 55-60%, grade 1 dd, mild MR. EKGs have shown iRBBB in the past. Last seen by Dr. Mayford Knife 07/06/23, stable at that time, no changes to management. Recommended 57yr followup with repeat coronary calcium score.  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 Maintained on vimpat and lamictal.  Other pertinent hx includes GERD, headaches, dizziness.  Preop labs reviewed, WNL. 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  EKG 03/30/23 (in setting of sepsis after prostate biopsy): Sinus tachycardia. Rate 115. Incomplete right bundle branch block. Borderline ST elevation, lateral leads  TTE 04/01/23: 1. Left ventricular ejection fraction, by estimation, is 55 to 60%. Left   ventricular ejection fraction by PLAX is 57 %. The left ventricle has  normal function. The left ventricle has no regional wall motion  abnormalities. There is mild left ventricular  hypertrophy. Left ventricular diastolic parameters are consistent with  Grade I diastolic dysfunction (impaired relaxation). The average left  ventricular global longitudinal strain is -17.0 %. The global longitudinal  strain is normal.   2. Right ventricular systolic function is normal. The right ventricular  size is normal. There is normal pulmonary artery systolic pressure.   3. The mitral valve is normal in structure. Mild mitral valve  regurgitation.   4. The aortic valve is tricuspid. Aortic valve regurgitation is not  visualized.   5. The inferior vena cava is dilated in size with >50% respiratory  variability, suggesting right atrial pressure of 8 mmHg.   Nuclear stress 12/18/19:  Nuclear stress EF: 60%.  There was no ST segment deviation noted during stress.  No T wave inversion was noted during stress.  The study is normal.  This is a low risk study.  The left ventricular ejection fraction is normal (55-65%).   )         Anesthesia Quick Evaluation

## 2023-09-11 NOTE — Telephone Encounter (Signed)
 This medication was discontinued by provider.

## 2023-09-12 ENCOUNTER — Ambulatory Visit (HOSPITAL_COMMUNITY)
Admission: RE | Admit: 2023-09-12 | Discharge: 2023-09-12 | Disposition: A | Discharge status: Home / Self Care | Payer: 59 | Attending: Orthopedic Surgery | Admitting: Orthopedic Surgery

## 2023-09-12 ENCOUNTER — Ambulatory Visit (HOSPITAL_COMMUNITY): Payer: 59 | Admitting: Physician Assistant

## 2023-09-12 ENCOUNTER — Other Ambulatory Visit: Payer: Self-pay

## 2023-09-12 ENCOUNTER — Encounter (HOSPITAL_COMMUNITY): Payer: Self-pay | Admitting: Orthopedic Surgery

## 2023-09-12 ENCOUNTER — Encounter (HOSPITAL_COMMUNITY)
Admission: RE | Disposition: A | Discharge status: Home / Self Care | Payer: Self-pay | Source: Home / Self Care | Attending: Orthopedic Surgery

## 2023-09-12 ENCOUNTER — Ambulatory Visit (HOSPITAL_COMMUNITY): Payer: 59

## 2023-09-12 DIAGNOSIS — I10 Essential (primary) hypertension: Secondary | ICD-10-CM | POA: Diagnosis not present

## 2023-09-12 DIAGNOSIS — E119 Type 2 diabetes mellitus without complications: Secondary | ICD-10-CM

## 2023-09-12 DIAGNOSIS — Z7984 Long term (current) use of oral hypoglycemic drugs: Secondary | ICD-10-CM | POA: Insufficient documentation

## 2023-09-12 DIAGNOSIS — S43432A Superior glenoid labrum lesion of left shoulder, initial encounter: Secondary | ICD-10-CM | POA: Diagnosis not present

## 2023-09-12 DIAGNOSIS — M7522 Bicipital tendinitis, left shoulder: Secondary | ICD-10-CM

## 2023-09-12 DIAGNOSIS — M65912 Unspecified synovitis and tenosynovitis, left shoulder: Secondary | ICD-10-CM | POA: Diagnosis not present

## 2023-09-12 DIAGNOSIS — X58XXXA Exposure to other specified factors, initial encounter: Secondary | ICD-10-CM | POA: Diagnosis not present

## 2023-09-12 DIAGNOSIS — Z87891 Personal history of nicotine dependence: Secondary | ICD-10-CM | POA: Insufficient documentation

## 2023-09-12 DIAGNOSIS — K219 Gastro-esophageal reflux disease without esophagitis: Secondary | ICD-10-CM | POA: Diagnosis not present

## 2023-09-12 DIAGNOSIS — M75102 Unspecified rotator cuff tear or rupture of left shoulder, not specified as traumatic: Secondary | ICD-10-CM

## 2023-09-12 DIAGNOSIS — G8918 Other acute postprocedural pain: Secondary | ICD-10-CM | POA: Diagnosis not present

## 2023-09-12 DIAGNOSIS — I700 Atherosclerosis of aorta: Secondary | ICD-10-CM | POA: Diagnosis not present

## 2023-09-12 DIAGNOSIS — M75122 Complete rotator cuff tear or rupture of left shoulder, not specified as traumatic: Secondary | ICD-10-CM | POA: Diagnosis present

## 2023-09-12 DIAGNOSIS — Z01818 Encounter for other preprocedural examination: Secondary | ICD-10-CM

## 2023-09-12 HISTORY — PX: SHOULDER ARTHROSCOPY WITH SUBACROMIAL DECOMPRESSION, ROTATOR CUFF REPAIR AND BICEP TENDON REPAIR: SHX5687

## 2023-09-12 LAB — GLUCOSE, CAPILLARY
Glucose-Capillary: 121 mg/dL — ABNORMAL HIGH (ref 70–99)
Glucose-Capillary: 108 mg/dL — ABNORMAL HIGH (ref 70–99)

## 2023-09-12 SURGERY — SHOULDER ARTHROSCOPY WITH SUBACROMIAL DECOMPRESSION, ROTATOR CUFF REPAIR AND BICEP TENDON REPAIR
Anesthesia: General | Site: Shoulder | Laterality: Left

## 2023-09-12 MED ORDER — PHENYLEPHRINE HCL-NACL 20-0.9 MG/250ML-% IV SOLN
INTRAVENOUS | Status: DC | PRN
  Administered 2023-09-12: 40 ug/min via INTRAVENOUS

## 2023-09-12 MED ORDER — MIDAZOLAM HCL 2 MG/2ML IJ SOLN
INTRAMUSCULAR | Status: AC
  Filled 2023-09-12: qty 2

## 2023-09-12 MED ORDER — PROPOFOL 10 MG/ML IV BOLUS
INTRAVENOUS | Status: DC | PRN
  Administered 2023-09-12: 150 mg via INTRAVENOUS

## 2023-09-12 MED ORDER — SUGAMMADEX SODIUM 200 MG/2ML IV SOLN
INTRAVENOUS | Status: AC
  Filled 2023-09-12: qty 2

## 2023-09-12 MED ORDER — BUPIVACAINE LIPOSOME 1.3 % IJ SUSP
INTRAMUSCULAR | Status: DC | PRN
  Administered 2023-09-12: 10 mL

## 2023-09-12 MED ORDER — ONDANSETRON HCL 4 MG/2ML IJ SOLN
INTRAMUSCULAR | Status: DC | PRN
  Administered 2023-09-12: 4 mg via INTRAVENOUS

## 2023-09-12 MED ORDER — PHENYLEPHRINE 80 MCG/ML (10ML) SYRINGE FOR IV PUSH (FOR BLOOD PRESSURE SUPPORT)
PREFILLED_SYRINGE | INTRAVENOUS | Status: DC | PRN
Start: 2023-09-12 — End: 2023-09-12
  Administered 2023-09-12: 160 ug via INTRAVENOUS
  Administered 2023-09-12 (×2): 80 ug via INTRAVENOUS

## 2023-09-12 MED ORDER — OXYCODONE HCL 5 MG PO TABS: 5.0000 mg | ORAL_TABLET | Freq: Once | ORAL | Status: DC | PRN

## 2023-09-12 MED ORDER — FENTANYL CITRATE (PF) 100 MCG/2ML IJ SOLN: 25.0000 ug | INTRAMUSCULAR | Status: DC | PRN

## 2023-09-12 MED ORDER — DEXAMETHASONE SODIUM PHOSPHATE 10 MG/ML IJ SOLN
INTRAMUSCULAR | Status: DC | PRN
Start: 2023-09-12 — End: 2023-09-12
  Administered 2023-09-12: 5 mg via INTRAVENOUS

## 2023-09-12 MED ORDER — ACETAMINOPHEN 500 MG PO TABS
1000.0000 mg | ORAL_TABLET | Freq: Once | ORAL | Status: AC
  Administered 2023-09-12: 1000 mg via ORAL
  Filled 2023-09-12: qty 2

## 2023-09-12 MED ORDER — MIDAZOLAM HCL 2 MG/2ML IJ SOLN
INTRAMUSCULAR | Status: DC | PRN
Start: 2023-09-12 — End: 2023-09-12
  Administered 2023-09-12 (×2): 1 mg via INTRAVENOUS

## 2023-09-12 MED ORDER — OXYCODONE HCL 5 MG/5ML PO SOLN: 5.0000 mg | Freq: Once | ORAL | Status: DC | PRN

## 2023-09-12 MED ORDER — SUGAMMADEX SODIUM 200 MG/2ML IV SOLN
INTRAVENOUS | Status: DC | PRN
  Administered 2023-09-12: 200 mg via INTRAVENOUS

## 2023-09-12 MED ORDER — BUPIVACAINE HCL (PF) 0.5 % IJ SOLN
INTRAMUSCULAR | Status: DC | PRN
  Administered 2023-09-12: 10 mL

## 2023-09-12 MED ORDER — LIDOCAINE 2% (20 MG/ML) 5 ML SYRINGE
INTRAMUSCULAR | Status: DC | PRN
  Administered 2023-09-12: 40 mg via INTRAVENOUS

## 2023-09-12 MED ORDER — EPINEPHRINE PF 1 MG/ML IJ SOLN
INTRAMUSCULAR | Status: AC
  Filled 2023-09-12: qty 2

## 2023-09-12 MED ORDER — CEFAZOLIN SODIUM-DEXTROSE 2-4 GM/100ML-% IV SOLN
2.0000 g | INTRAVENOUS | Status: AC
  Administered 2023-09-12: 2 g via INTRAVENOUS
  Filled 2023-09-12: qty 100

## 2023-09-12 MED ORDER — PHENYLEPHRINE 80 MCG/ML (10ML) SYRINGE FOR IV PUSH (FOR BLOOD PRESSURE SUPPORT)
PREFILLED_SYRINGE | INTRAVENOUS | Status: AC
  Filled 2023-09-12: qty 10

## 2023-09-12 MED ORDER — LIDOCAINE 2% (20 MG/ML) 5 ML SYRINGE
INTRAMUSCULAR | Status: AC
  Filled 2023-09-12: qty 5

## 2023-09-12 MED ORDER — MELOXICAM 15 MG PO TABS: 15.0000 mg | ORAL_TABLET | Freq: Every day | ORAL | 0 refills | Status: AC

## 2023-09-12 MED ORDER — DEXAMETHASONE SODIUM PHOSPHATE 10 MG/ML IJ SOLN
INTRAMUSCULAR | Status: AC
Start: 2023-09-12 — End: ?
  Filled 2023-09-12: qty 1

## 2023-09-12 MED ORDER — 0.9 % SODIUM CHLORIDE (POUR BTL) OPTIME
TOPICAL | Status: DC | PRN
  Administered 2023-09-12: 1000 mL

## 2023-09-12 MED ORDER — FENTANYL CITRATE (PF) 250 MCG/5ML IJ SOLN
INTRAMUSCULAR | Status: DC | PRN
  Administered 2023-09-12 (×2): 50 ug via INTRAVENOUS

## 2023-09-12 MED ORDER — SODIUM CHLORIDE 0.9 % IR SOLN: Status: DC | PRN

## 2023-09-12 MED ORDER — EPHEDRINE 5 MG/ML INJ
INTRAVENOUS | Status: AC
  Filled 2023-09-12: qty 5

## 2023-09-12 MED ORDER — ROCURONIUM BROMIDE 10 MG/ML (PF) SYRINGE
PREFILLED_SYRINGE | INTRAVENOUS | Status: AC
  Filled 2023-09-12: qty 10

## 2023-09-12 MED ORDER — ONDANSETRON HCL 4 MG/2ML IJ SOLN
INTRAMUSCULAR | Status: AC
Start: 2023-09-12 — End: ?
  Filled 2023-09-12: qty 2

## 2023-09-12 MED ORDER — CHLORHEXIDINE GLUCONATE 0.12 % MT SOLN
15.0000 mL | Freq: Once | OROMUCOSAL | Status: AC
  Administered 2023-09-12: 15 mL via OROMUCOSAL
  Filled 2023-09-12: qty 15

## 2023-09-12 MED ORDER — OXYCODONE HCL 5 MG PO TABS: 5.0000 mg | ORAL_TABLET | ORAL | 0 refills | Status: DC | PRN

## 2023-09-12 MED ORDER — EPHEDRINE SULFATE-NACL 50-0.9 MG/10ML-% IV SOSY
PREFILLED_SYRINGE | INTRAVENOUS | Status: DC | PRN
  Administered 2023-09-12 (×4): 5 mg via INTRAVENOUS

## 2023-09-12 MED ORDER — ALBUMIN HUMAN 5 % IV SOLN: INTRAVENOUS | Status: DC | PRN

## 2023-09-12 MED ORDER — DROPERIDOL 2.5 MG/ML IJ SOLN: 0.6250 mg | Freq: Once | INTRAMUSCULAR | Status: DC | PRN

## 2023-09-12 MED ORDER — PROPOFOL 10 MG/ML IV BOLUS
INTRAVENOUS | Status: AC
  Filled 2023-09-12: qty 20

## 2023-09-12 MED ORDER — SODIUM CHLORIDE (PF) 0.9 % IJ SOLN
INTRAMUSCULAR | Status: DC | PRN
  Administered 2023-09-12 (×2): 3000 mL

## 2023-09-12 MED ORDER — ORAL CARE MOUTH RINSE: 15.0000 mL | Freq: Once | OROMUCOSAL | Status: AC

## 2023-09-12 MED ORDER — FENTANYL CITRATE (PF) 250 MCG/5ML IJ SOLN
INTRAMUSCULAR | Status: AC
Start: 2023-09-12 — End: ?
  Filled 2023-09-12: qty 5

## 2023-09-12 MED ORDER — INSULIN ASPART 100 UNIT/ML IJ SOLN: 0.0000 [IU] | INTRAMUSCULAR | Status: DC | PRN

## 2023-09-12 MED ORDER — ROCURONIUM BROMIDE 10 MG/ML (PF) SYRINGE
PREFILLED_SYRINGE | INTRAVENOUS | Status: DC | PRN
  Administered 2023-09-12: 70 mg via INTRAVENOUS

## 2023-09-12 MED ORDER — ACETAMINOPHEN 10 MG/ML IV SOLN: 1000.0000 mg | Freq: Once | INTRAVENOUS | Status: DC | PRN

## 2023-09-12 MED ORDER — LACTATED RINGERS IV SOLN: INTRAVENOUS | Status: DC

## 2023-09-12 SURGICAL SUPPLY — 62 items
ANCHOR FBRTK 2.6 SUTURETAP 1.3 (Anchor) IMPLANT
ANCHOR SUT 1.8 FIBERTAK SB KL (Anchor) IMPLANT
ANCHOR SWIVELOCK BIO 4.75X19.1 (Anchor) IMPLANT
BAG COUNTER SPONGE SURGICOUNT (BAG) IMPLANT
BLADE EXCALIBUR 4.0X13 (MISCELLANEOUS) IMPLANT
BLADE SHAVER TORPEDO 4X13 (MISCELLANEOUS) IMPLANT
BURR OVAL 8 FLU 4.0X13 (MISCELLANEOUS) IMPLANT
COVER SURGICAL LIGHT HANDLE (MISCELLANEOUS) ×1 IMPLANT
DRAPE INCISE IOBAN 66X45 STRL (DRAPES) ×2 IMPLANT
DRAPE STERI 35X30 U-POUCH (DRAPES) ×1 IMPLANT
DRAPE U-SHAPE 47X51 STRL (DRAPES) ×2 IMPLANT
DRSG TEGADERM 4X4.75 (GAUZE/BANDAGES/DRESSINGS) ×3 IMPLANT
DURAPREP 26ML APPLICATOR (WOUND CARE) ×1 IMPLANT
DW OUTFLOW CASSETTE/TUBE SET (MISCELLANEOUS) ×1 IMPLANT
ELECT REM PT RETURN 9FT ADLT (ELECTROSURGICAL) ×1 IMPLANT
ELECTRODE REM PT RTRN 9FT ADLT (ELECTROSURGICAL) ×1 IMPLANT
GAUZE SPONGE 4X4 12PLY STRL (GAUZE/BANDAGES/DRESSINGS) ×1 IMPLANT
GAUZE SPONGE 4X4 12PLY STRL LF (GAUZE/BANDAGES/DRESSINGS) ×1 IMPLANT
GAUZE XEROFORM 1X8 LF (GAUZE/BANDAGES/DRESSINGS) IMPLANT
GLOVE BIOGEL PI IND STRL 7.0 (GLOVE) ×1 IMPLANT
GLOVE BIOGEL PI IND STRL 8 (GLOVE) ×1 IMPLANT
GLOVE ECLIPSE 7.0 STRL STRAW (GLOVE) ×1 IMPLANT
GLOVE ECLIPSE 8.0 STRL XLNG CF (GLOVE) ×1 IMPLANT
GOWN STRL REUS W/ TWL LRG LVL3 (GOWN DISPOSABLE) ×3 IMPLANT
KIT BASIN OR (CUSTOM PROCEDURE TRAY) ×1 IMPLANT
KIT STR SPEAR 1.8 FBRTK DISP (KITS) IMPLANT
KIT TURNOVER KIT B (KITS) ×1 IMPLANT
MANIFOLD NEPTUNE II (INSTRUMENTS) ×1 IMPLANT
NDL HD SCORPION MEGA LOADER (NEEDLE) IMPLANT
NDL SCORPION MULTI FIRE (NEEDLE) IMPLANT
NDL SPNL 18GX3.5 QUINCKE PK (NEEDLE) ×1 IMPLANT
NDL SUT 6 .5 CRC .975X.05 MAYO (NEEDLE) IMPLANT
NEEDLE SCORPION MULTI FIRE (NEEDLE) IMPLANT
NEEDLE SPNL 18GX3.5 QUINCKE PK (NEEDLE) ×1 IMPLANT
NS IRRIG 1000ML POUR BTL (IV SOLUTION) ×1 IMPLANT
PACK SHOULDER (CUSTOM PROCEDURE TRAY) ×1 IMPLANT
PAD ARMBOARD 7.5X6 YLW CONV (MISCELLANEOUS) ×2 IMPLANT
PROBE APOLLO 90XL (SURGICAL WAND) ×1 IMPLANT
RESTRAINT HEAD UNIVERSAL NS (MISCELLANEOUS) ×1 IMPLANT
SLING ARM IMMOBILIZER LRG (SOFTGOODS) IMPLANT
SPONGE T-LAP 4X18 ~~LOC~~+RFID (SPONGE) ×2 IMPLANT
STRIP CLOSURE SKIN 1/2X4 (GAUZE/BANDAGES/DRESSINGS) ×1 IMPLANT
SUCTION TUBE FRAZIER 10FR DISP (SUCTIONS) ×1 IMPLANT
SUT 0 FIBERLOOP 38 BLUE TPR ND (SUTURE) ×4 IMPLANT
SUT ETHILON 3 0 PS 1 (SUTURE) ×1 IMPLANT
SUT FIBERWIRE #2 38 T-5 BLUE (SUTURE) IMPLANT
SUT MNCRL AB 3-0 PS2 18 (SUTURE) ×1 IMPLANT
SUT VIC AB 0 CT1 27XBRD ANBCTR (SUTURE) ×1 IMPLANT
SUT VIC AB 1 CT1 27XBRD ANBCTR (SUTURE) IMPLANT
SUT VIC AB 2-0 CT2 27 (SUTURE) ×1 IMPLANT
SUT VICRYL 0 UR6 27IN ABS (SUTURE) ×1 IMPLANT
SUT VICRYL 1 TIES 12X18 (SUTURE) ×1 IMPLANT
SUTURE 0 FIBERLP 38 BLU TPR ND (SUTURE) IMPLANT
SUTURE FIBERWR #2 38 T-5 BLUE (SUTURE) IMPLANT
SUTURE TAPE 1.3 FIBERLOP 20 ST (SUTURE) IMPLANT
SUTURETAPE 1.3 FIBERLOOP 20 ST (SUTURE) IMPLANT
SYS FBRTK BUTTON 2.6 (Anchor) ×1 IMPLANT
SYSTEM FBRTK BUTTON 2.6 (Anchor) IMPLANT
TOWEL GREEN STERILE (TOWEL DISPOSABLE) ×1 IMPLANT
TOWEL GREEN STERILE FF (TOWEL DISPOSABLE) ×1 IMPLANT
TUBING ARTHROSCOPY IRRIG 16FT (MISCELLANEOUS) ×1 IMPLANT
WATER STERILE IRR 1000ML POUR (IV SOLUTION) ×1 IMPLANT

## 2023-09-12 NOTE — Anesthesia Postprocedure Evaluation (Signed)
 Anesthesia Post Note  Patient: Joshua [name]  Procedure(s) Performed: LEFT SHOULDER ARTHROSCOPY, DEBRIDEMENT, MINI OPEN BICEP TENDON REPAIR AND ROTATOR CUFF TEAR REPAIR (Left: Shoulder)     Patient location during evaluation: PACU Anesthesia Type: General Level of consciousness: awake and alert Pain management: pain level controlled Vital Signs Assessment: post-procedure vital signs reviewed and stable Respiratory status: spontaneous breathing, nonlabored ventilation, respiratory function stable and patient connected to nasal cannula oxygen Cardiovascular status: blood pressure returned to baseline and stable Postop Assessment: no apparent nausea or vomiting Anesthetic complications: no   No notable events documented.  Last Vitals:  Vitals:   09/12/23 1030 09/12/23 1046  BP: 130/74 121/77  Pulse: 82 79  Resp: 16 16  Temp:  36.4 C  SpO2: 98% 97%    Last Pain:  Vitals:   09/12/23 1046  TempSrc:   PainSc: 0-No pain                 [place]

## 2023-09-12 NOTE — Transfer of Care (Signed)
 Immediate Anesthesia Transfer of Care Note  Patient: MAURICE FOTHERINGHAM  Procedure(s) Performed: LEFT SHOULDER ARTHROSCOPY, DEBRIDEMENT, MINI OPEN BICEP TENDON REPAIR AND ROTATOR CUFF TEAR REPAIR (Left: Shoulder)  Patient Location: PACU  Anesthesia Type:GA combined with regional for post-op pain  Level of Consciousness: awake, alert , patient cooperative, and responds to stimulation  Airway & Oxygen Therapy: Patient Spontanous Breathing and Patient connected to nasal cannula oxygen  Post-op Assessment: Report given to RN and Post -op Vital signs reviewed and stable  Post vital signs: Reviewed and stable  Last Vitals:  Vitals Value Taken Time  BP 139/81 09/12/23 1016  Temp    Pulse 91 09/12/23 1021  Resp 20 09/12/23 1021  SpO2 87 % 09/12/23 1021  Vitals shown include unfiled device data.  Last Pain:  Vitals:   09/12/23 0620  TempSrc:   PainSc: 8       Patients Stated Pain Goal: 4 (09/12/23 4782)  Complications: No notable events documented.

## 2023-09-12 NOTE — Brief Op Note (Signed)
   09/12/2023  9:57 AM  PATIENT:  [name]  63 y.o. male  PRE-OPERATIVE DIAGNOSIS:  left supraspinatus rotator cuff tear, biceps tendinosis,shoulder synovitis  POST-OPERATIVE DIAGNOSIS:  left supraspinatus rotator cuff tear, biceps tendinosis,shoulder synovitis  PROCEDURE:  Procedure(s): LEFT SHOULDER ARTHROSCOPY, DEBRIDEMENT, MINI OPEN BICEP TENDON REPAIR AND ROTATOR CUFF TEAR REPAIR  SURGEON:  Surgeon(s): August Saucer, Corrie Mckusick, MD  ASSISTANT: magnant pa  ANESTHESIA:   general  EBL: 25 ml    Total I/O In: 350 [IV Piggyback:350] Out: -   BLOOD ADMINISTERED: none  DRAINS: none   LOCAL MEDICATIONS USED:  none  SPECIMEN:  No Specimen  COUNTS:  YES  TOURNIQUET:  * No tourniquets in log *  DICTATION: .Other Dictation: Dictation Number [id number]  PLAN OF CARE: Discharge to home after PACU  PATIENT DISPOSITION:  PACU - hemodynamically stable

## 2023-09-12 NOTE — Op Note (Signed)
 NAME: Joshua [name], Joshua [name] MEDICAL RECORD NO: [id number] ACCOUNT NO: [id number] DATE OF BIRTH: 11-May-1961 FACILITY: [facility] LOCATION: [facility] PHYSICIAN: Graylin Shiver. August Saucer, MD  Operative Report   DATE OF PROCEDURE: 09/12/2023  PREOPERATIVE DIAGNOSES:  Left shoulder synovitis with significant rotator cuff tearing and superior labral tear and rotator cuff tear of the supraspinatus retracted.  POSTOPERATIVE DIAGNOSES:  Left shoulder synovitis with significant rotator cuff tearing and superior labral tear and rotator cuff tear of the supraspinatus retracted.  PROCEDURE:  Left shoulder arthroscopy with debridement of synovitis within the shoulder along with debridement of the superior labral tear and the frayed biceps tendon with mini-open biceps tenodesis and rotator cuff tear repair using a 2 x 2 construct  of a 2 x 2 cm tear of the supraspinatus.  SURGEON:  Graylin Shiver. August Saucer, MD  ASSISTANT:  Karenann Cai, PA  INDICATIONS:  The patient is a 63 year old patient with left shoulder pain who presents for operative management after explanation of risks and benefits.  PROCEDURE IN DETAILS:  The patient was brought to the operating room where general endotracheal anesthesia was induced.  Preoperative antibiotics were administered.  Timeout was called.  The patient was placed in the beach chair position with the head in  neutral position.  Left shoulder, arm, and hand were then prescrubbed with alcohol and Betadine, which was allowed to air dry and then prepped with ChloraPrep solution and draped in a sterile manner.  Ioban used to seal the operative field and cover the  axilla.  After calling timeout, posterior portal was created 2 cm medial and inferior to the posterolateral margins of the acromion.  Diagnostic arthroscopy was performed.  Anterior portal was created under direct visualization.  The patient had  significant synovitis within the rotator interval and around the capsule.  This was debrided with  the Arthrocare wand.  Glenohumeral articular surfaces were intact.  Significant tearing and fraying of the intraarticular supraspinatus was present.  This  was debrided with a shaver.  The biceps tendon also had enough fraying and partial-thickness tearing that had actually interfered with visualization.  This required debridement as well.  The patient had a degenerative superior labral tear, which was  unstable.  The superior labrum was debrided with the Arthrocare wand.  The biceps tendon was released.  The anterior inferior and posterior inferior glenohumeral ligaments were intact.  Chondral surfaces did not require debridement.  Following extensive  debridement of the shoulder synovitis, rotator cuff, superior labrum, and biceps tendon, the instruments were removed and the portals were closed using 3-0 nylon.  Collier Flowers was then used to cover the entire operative field.  An incision was made off the  anterolateral margin of the acromion.  Skin and subcutaneous tissue were sharply divided.  #1 Vicryl stay suture placed 4 cm from the anterolateral margin of the acromion between the anterior middle raphe of the deltoid.  The deltoid was then split.   Bursectomy was performed.  Transverse humeral ligament was opened.  The biceps tendon was then debrided further because of significant partial-thickness tearing present.  A FiberLink suture was placed around the biceps tendon and then it was tenodesed in  the bicipital groove under appropriate tension using two knotless SutureTak anchors oversewn using three 0 Vicryl sutures.  Secure fixation was achieved under appropriate tension of the biceps tendon.  Attention was then directed towards the rotator  cuff tear.  Rotator cuff tear was present.  Sharp dissection was performed to debride the edges of the rotator  cuff tendon.  This was a 2 x 2 tear involving primarily the supraspinatus and leading anterior Joshua [name] of the infraspinatus.  Tear could mobilize  easily back  to its attachment site.  The landing zone in the greater tuberosity was prepared using a rongeur.  Next, we alternated 0 Vicryl sutures and 0 FiberWire sutures in the supraspinatus to improve mobilization.  One margin converging suture was  placed posteriorly.  Next, we placed two knotless SutureTape anchors at the junction of the articular surface and the greater tuberosity.  Eight SutureTapes were then passed posterior to anterior using the Scorpion suture passer.  Next, the tendon was  reduced using the Vicryl and 0 FiberWire traction sutures and the suture tapes were tied and crossed.  Next, we incorporated a combination of the anterior suture tapes and the anterior Vicryls into a suture into a SwiveLock.  In a similar manner, the  posterior tapes and 0 Vicryl sutures were incorporated into a posterior SwiveLock.  Arm was abducted and the SwiveLocks were placed and secure fixation was achieved.  Arm was taken through a range of motion.  Acromioplasty performed.  Thorough irrigation  was performed.  Deltoid split closed using #1 Vicryl suture followed by interrupted inverted 0 Vicryl suture, 2-0 Vicryl suture and 3-0 Monocryl with Steri-Strips and impervious dressings applied along with a shoulder sling.  Luke's assistance was  required at all times for retraction, opening and closing, mobilization of tissue.  His assistance was of medical necessity.  PLAN: At this time would be sling immobilization for 2 weeks followed by passive range of motion with brace starting tomorrow.  Based on stability of the repair, I anticipate being able to discontinue the sling for activities of daily living, not lifting  more than a pound after 2 weeks.   VAI D: 09/12/2023 10:05:08 am T: 09/12/2023 10:20:00 am  JOB: [id number]/ [id number]

## 2023-09-12 NOTE — Anesthesia Procedure Notes (Signed)
 Anesthesia Regional Block: Interscalene brachial plexus block   Pre-Anesthetic Checklist: , timeout performed,  Correct Patient, Correct Site, Correct Laterality,  Correct Procedure, Correct Position, site marked,  Risks and benefits discussed,  Surgical consent,  Pre-op evaluation,  At surgeon's request and post-op pain management  Laterality: Left  Prep: chloraprep       Needles:  Injection technique: Single-shot  Needle Type: Echogenic Stimulator Needle     Needle Length: 9cm  Needle Gauge: 21     Additional Needles:   Procedures:,,,, ultrasound used (permanent image in chart),,    Narrative:  Start time: 09/12/2023 7:11 AM End time: 09/12/2023 7:16 AM Injection made incrementally with aspirations every 5 mL.  Performed by: Personally  Anesthesiologist: [place] Nation, MD  Additional Notes: Discussed risks and benefits of the nerve block in detail, including but not limited vascular injury, permanent nerve damage and infection.   Patient tolerated the procedure well. Local anesthetic introduced in an incremental fashion under minimal resistance after negative aspirations. No paresthesias were elicited. After completion of the procedure, no acute issues were identified and patient continued to be monitored by RN.

## 2023-09-12 NOTE — Anesthesia Procedure Notes (Addendum)
 Procedure Name: Intubation Date/Time: 09/12/2023 7:46 AM  Performed by: Shary Decamp, CRNAPre-anesthesia Checklist: Patient identified, Patient being monitored, Timeout performed, Emergency Drugs available and Suction available Patient Re-evaluated:Patient Re-evaluated prior to induction Oxygen Delivery Method: Circle System Utilized Preoxygenation: Pre-oxygenation with 100% oxygen Induction Type: IV induction Ventilation: Mask ventilation without difficulty Laryngoscope Size: Miller and 3 Grade View: Grade I Tube type: Oral Tube size: 8.0 mm Number of attempts: 1 Airway Equipment and Method: Stylet Placement Confirmation: ETT inserted through vocal cords under direct vision, positive ETCO2 and breath sounds checked- equal and bilateral Secured at: 22 cm Tube secured with: Tape Dental Injury: Teeth and Oropharynx as per pre-operative assessment

## 2023-09-12 NOTE — H&P (Signed)
 Joshua [name] is an 63 y.o. male.   Chief Complaint: Left shoulder pain HPI: Joshua [name] is a 63 y.o. male who presents reporting continued left shoulder pain.  Since he was last seen has had an MRI scan which shows 1 cm retracted tear of the supraspinatus.  No evidence of frozen shoulder in the inferior capsule.  Patient reports both pain and weakness.  He describes grinding with overhead motion.  He is having a lot of difficulty sleeping.  Has been taking anti-inflammatories and muscle relaxer as well as using topical cream.  3 months ago he had a subacromial injection which helped him only for a week.  He is right-hand dominant and uses a cane in that right-hand side.  He is a retired Administrator, [employer] but he likes to fish and he states that he cannot really pick up his grand children at this time..   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   Meningioma (HCC)    Meningioma, recurrent of brain (HCC) 01/31/2019   Neuropathy 12/05/2019   Rotator cuff tear    right   Seizures (HCC)    11/27/19 last sz 1 wk ago    Past Surgical History:  Procedure Laterality Date   APPLICATION OF CRANIAL NAVIGATION N/A 01/10/2019   Procedure: APPLICATION OF CRANIAL NAVIGATION;  Surgeon: Maeola Harman, MD;  Location: [facility] OR;  Service: Neurosurgery;  Laterality: N/A;   CATARACT EXTRACTION Right    COLONOSCOPY WITH PROPOFOL N/A 04/10/2023   Procedure: COLONOSCOPY WITH PROPOFOL;  Surgeon: Toney Reil, MD;  Location[facility] ENDOSCOPY;  Service: Gastroenterology;  Laterality:  N/A;   CRANIOTOMY Right 11/06/2012   Procedure: CRANIOTOMY TUMOR EXCISION;  Surgeon: Maeola Harman, MD;  Location: [facility] NEURO ORS;  Service: Neurosurgery;  Laterality: Right;  Right Parasagittal craniotomy for meningioma with Stealth   CRANIOTOMY Right 01/10/2019   Procedure: Right Parasagittal Craniotomy for Tumor;  Surgeon: Maeola Harman, MD;  Location: [facility] OR;  Service: Neurosurgery;  Laterality: Right;  Right parasagittal craniotomy for tumor   ESOPHAGOGASTRODUODENOSCOPY     JOINT REPLACEMENT Right 2018   right hip   PROSTATE BIOPSY  03/2023   REVERSE SHOULDER ARTHROPLASTY Right 01/08/2021   Procedure: RIGHT REVERSE SHOULDER ARTHROPLASTY;  Surgeon: Cammy Copa, MD;  Location: [facility] OR;  Service: Orthopedics;  Laterality: Right;   right knee arthroscopy     SHOULDER ARTHROSCOPY Right 06/09/2020   Procedure: RIGHT SHOULDER ARTHROSCOPY AND DEBRIDEMENT;  Surgeon: Nadara Mustard, MD;  Location: [facility];  Service: Orthopedics;  Laterality: Right;    Family History  Problem Relation Age of Onset   Hypertension Mother    Dementia Mother    Diabetes Mother    Hypertension Father    CAD Father    Diabetes Father    CAD Brother    Social History:  reports that he quit smoking about 4 years ago. His smoking use included cigarettes. He started smoking about 19 years ago. He has a 0.2 pack-year smoking history. He has  been exposed to tobacco smoke. He has never used smokeless tobacco. He reports current alcohol use of about 2.0 standard drinks of alcohol per week. He reports that he does not use drugs.  Allergies: No Known Allergies  Medications Prior to Admission  Medication Sig Dispense Refill   acetaminophen (TYLENOL) 500 MG tablet Take 1,000 mg by mouth every 6 (six) hours as needed for moderate pain or headache.     albuterol (VENTOLIN HFA) 108 (90 Base) MCG/ACT inhaler Inhale 2 puffs into the lungs every 6 (six) hours as needed for wheezing or shortness of breath. 8 g  2   amLODipine (NORVASC) 5 MG tablet TAKE 1 TABLET (5 MG TOTAL) BY MOUTH DAILY. TO LOWER BLOOD PRESSURE. STOP LOSARTAN 90 tablet 0   aspirin 81 MG chewable tablet Chew 81 mg by mouth daily.     atorvastatin (LIPITOR) 20 MG tablet TAKE 1 TABLET (20 MG TOTAL) BY MOUTH DAILY. 30 tablet 0   clonazePAM (KLONOPIN) 0.5 MG tablet TAKE 1 TABLET (0.5 MG TOTAL) BY MOUTH AT BEDTIME. (Patient taking differently: Take 0.5 mg by mouth at bedtime as needed for anxiety.) 60 tablet 2   gabapentin (NEURONTIN) 300 MG capsule Take 300 mg by mouth 3 (three) times daily as needed (nerve pain).     HYDROcodone-acetaminophen (NORCO/VICODIN) 5-325 MG tablet Take 1 tablet by mouth every 6 (six) hours as needed for moderate pain (pain score 4-6). 25 tablet 0   lamoTRIgine (LAMICTAL) 200 MG tablet Take 1 tablet (200 mg total) by mouth 2 (two) times daily. 60 tablet 3   lidocaine (LIDODERM) 5 % Place 1 patch onto the skin daily. Remove & Discard patch within 12 hours or as directed by MD (Patient taking differently: Place 1 patch onto the skin daily as needed (pain). Remove & Discard patch within 12 hours or as directed by MD) 14 patch 0   meclizine (ANTIVERT) 25 MG tablet Take 1 tablet (25 mg total) by mouth 3 (three) times daily as needed for dizziness. 30 tablet 0   metFORMIN (GLUCOPHAGE) 500 MG tablet Take 1 tablet (500 mg total) by mouth 2 (two) times daily. 180 tablet 1   methocarbamol (ROBAXIN) 750 MG tablet Take 750 mg by mouth daily as needed for muscle spasms.     omeprazole (PRILOSEC) 20 MG capsule Take 20 mg by mouth daily.     propranolol (INDERAL) 10 MG tablet Take 1 tablet (10 mg total) by mouth 3 (three) times daily as needed (palpitations). 60 tablet 11   tamsulosin (FLOMAX) 0.4 MG CAPS capsule Take 1 capsule (0.4 mg total) by mouth daily. 90 capsule 3   VITAMIN A PO Take 1 capsule by mouth daily.     tadalafil (CIALIS) 5 MG tablet Take 1 tablet (5 mg total) by mouth daily as needed for erectile dysfunction. 1  hour prior to sexual activity 30 tablet 11    No results found. However, due to the size of the patient record, not all encounters were searched. Please check Results Review for a complete set of results. No results found.  Review of Systems  Musculoskeletal:  Positive for arthralgias.  All other systems reviewed and are negative.   Blood pressure (!) 154/77, pulse 72, temperature 98 F (36.7 C), temperature source Oral, resp. rate 17, height 5\' 8"  (1.727 m), weight 79.4 kg, SpO2 97%. Physical Exam Vitals reviewed.  HENT:     Head: Normocephalic.     Nose: Nose normal.     Mouth/Throat:  Mouth: Mucous membranes are moist.  Eyes:     Pupils: Pupils are equal, round, and reactive to light.  Cardiovascular:     Rate and Rhythm: Normal rate.     Pulses: Normal pulses.  Pulmonary:     Effort: Pulmonary effort is normal.  Abdominal:     General: Abdomen is flat.  Musculoskeletal:        General: Normal range of motion.     Cervical back: Normal range of motion.  Skin:    General: Skin is warm.     Capillary Refill: Capillary refill takes less than 2 seconds.  Neurological:     General: No focal deficit present.     Mental Status: He is alert.  Psychiatric:        Mood and Affect: Mood normal.    Ortho exam demonstrates on the left-hand side some crepitus with internal/external rotation 90 degrees of abduction. Motor and sensory function the hand is intact. Radial pulses intact. Patient is slightly weaker external rotation strength on the left compared to the right. Does have positive O'Brien's testing and positive bicipital groove tenderness on the left and positive speeds testing. No discrete AC joint tenderness is present. Cervical spine range of motion is full.   Assessment/Plan Impression is left shoulder rotator cuff tear. He is done well with his right reverse shoulder replacement. Plan is left shoulder arthroscopy with debridement biceps tenodesis and rotator cuff tear  repair. The risk and benefits are discussed with the patient including not limited to infection or vessel damage incomplete pain relief as well as incomplete return of function. The expected rehabilitation time is also discussed. All questions answered. Norco prescribed today to be taken on a infrequent basis   Burnard Bunting, MD 09/12/2023, 6:00 AM

## 2023-09-13 ENCOUNTER — Encounter (HOSPITAL_COMMUNITY): Payer: Self-pay | Admitting: Orthopedic Surgery

## 2023-09-20 ENCOUNTER — Encounter: Payer: Self-pay | Admitting: Surgical

## 2023-09-20 ENCOUNTER — Ambulatory Visit (INDEPENDENT_AMBULATORY_CARE_PROVIDER_SITE_OTHER): Payer: 59 | Admitting: Surgical

## 2023-09-20 DIAGNOSIS — Z9889 Other specified postprocedural states: Secondary | ICD-10-CM

## 2023-09-20 NOTE — Progress Notes (Signed)
 Post-Op Visit Note   Patient: Joshua [name]           Date of Birth: 21-Aug-1960           MRN: [id number] Visit Date: 09/20/2023 PCP: [facility]   Assessment & Plan:  Chief Complaint:  Chief Complaint  Patient presents with   Left Shoulder - Routine Post Op    LEFT SHOULDER SCOPE (surgery date 09-12-23)   Visit Diagnoses:  1. S/P left rotator cuff repair     Plan: MEKAI WILKINSON is a 63 y.o. male who presents s/p left shoulder rotator cuff repair and biceps tenodesis on 09/12/2023.  Patient is doing well and pain is overall controlled.  Using CPM machine.  Denies any chest pain, SOB, fevers, chills. Taking pain medication about once every day at this point.   On exam, patient has range of motion 20 degrees X rotation, 90 degrees abduction, 100 degrees forward elevation passively.  Intact EPL, FPL, finger abduction, finger adduction, pronation/supination, bicep, tricep, deltoid of operative extremity.  Axillary nerve intact with deltoid firing.  Incisions are healing well without evidence of infection or dehiscence.  Sutures removed and replaced with Steri-Strips today.  2+ radial pulse of the operative extremity  Plan is continue with CPM machine for the next 2 weeks.  Follow-up in 2 weeks for clinical recheck and initiation of physical therapy.  Continue using sling when he is out of his home and ambulatory.  No lifting with the operative arm and no active range of motion yet but of shoulder..   Follow-Up Instructions: No follow-ups on file.   Orders:  No orders of the defined types were placed in this encounter.  No orders of the defined types were placed in this encounter.   Imaging: No results found.  PMFS History: Patient Active Problem List   Diagnosis Date Noted   History of colonic polyps 04/10/2023   Acute prostatitis 04/01/2023   DM (diabetes mellitus) (HCC) 03/31/2023   Demand ischemia of myocardium (HCC) 03/31/2023   Severe sepsis (HCC)  03/30/2023   Elevated troponin 03/30/2023   Paroxysmal SVT (supraventricular tachycardia) (HCC) 03/30/2023   High anion gap metabolic acidosis 03/30/2023   Hematuria 03/30/2023   S/p prostate biopsy 03/29/23 03/30/2023   Hypotension 03/30/2023   History of recurrent meningioma s/p resection x2 03/30/2023   Aortic atherosclerosis (HCC) 10/20/2022   Pain in limb 06/14/2022   Pain due to onychomycosis of toenails of both feet 03/04/2021   Arthritis of right shoulder region    S/P reverse total shoulder arthroplasty, 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   Essential hypertension 07/02/2020   Prediabetes 07/02/2020   Polyuria 07/02/2020   Nontraumatic complete tear of right rotator cuff    Impingement syndrome of right shoulder    Neuropathy 12/05/2019   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   Seizures (HCC) 01/31/2019   Meningioma, recurrent of brain (HCC) 01/31/2019   Meningioma (HCC) 01/03/2019   Diffuse idiopathic skeletal hyperostosis 06/05/2013   Past Medical History:  Diagnosis Date   Anxiety    Aortic atherosclerosis (HCC)    Brain tumor (HCC) 02/20/2013   brain tumor removed in March 2014, Dr Rush Farmer   Diabetes mellitus without complication (HCC)    Diffuse idiopathic skeletal hyperostosis 06/05/2013   Dizziness    Enlarged prostate    GERD (gastroesophageal reflux disease)    Headache(784.0)    scattered   High cholesterol    Hypertension    Malignant meningioma of meninges  of brain (HCC) 03/07/2019   Meningioma (HCC)    Meningioma, recurrent of brain (HCC) 01/31/2019   Neuropathy 12/05/2019   Rotator cuff tear    right   Seizures (HCC)    11/27/19 last sz 1 wk ago    Family History  Problem Relation Age of Onset   Hypertension Mother    Dementia Mother    Diabetes Mother    Hypertension Father    CAD Father    Diabetes Father    CAD Brother     Past Surgical History:  Procedure Laterality Date   APPLICATION OF CRANIAL NAVIGATION N/A 01/10/2019    Procedure: APPLICATION OF CRANIAL NAVIGATION;  Surgeon: Maeola Harman, MD;  Location: [facility] OR;  Service: Neurosurgery;  Laterality: N/A;   CATARACT EXTRACTION Right    COLONOSCOPY WITH PROPOFOL N/A 04/10/2023   Procedure: COLONOSCOPY WITH PROPOFOL;  Surgeon: Toney Reil, MD;  Location: [facility] ENDOSCOPY;  Service: Gastroenterology;  Laterality: N/A;   CRANIOTOMY Right 11/06/2012   Procedure: CRANIOTOMY TUMOR EXCISION;  Surgeon: Maeola Harman, MD;  Location: [facility] NEURO ORS;  Service: Neurosurgery;  Laterality: Right;  Right Parasagittal craniotomy for meningioma with Stealth   CRANIOTOMY Right 01/10/2019   Procedure: Right Parasagittal Craniotomy for Tumor;  Surgeon: Maeola Harman, MD;  Location: [facility] OR;  Service: Neurosurgery;  Laterality: Right;  Right parasagittal craniotomy for tumor   ESOPHAGOGASTRODUODENOSCOPY     JOINT REPLACEMENT Right 2018   right hip   PROSTATE BIOPSY  03/2023   REVERSE SHOULDER ARTHROPLASTY Right 01/08/2021   Procedure: RIGHT REVERSE SHOULDER ARTHROPLASTY;  Surgeon: Cammy Copa, MD;  Location: [facility] OR;  Service: Orthopedics;  Laterality: Right;   right knee arthroscopy     SHOULDER ARTHROSCOPY Right 06/09/2020   Procedure: RIGHT SHOULDER ARTHROSCOPY AND DEBRIDEMENT;  Surgeon: Nadara Mustard, MD;  Location: [facility];  Service: Orthopedics;  Laterality: Right;   SHOULDER ARTHROSCOPY WITH SUBACROMIAL DECOMPRESSION, ROTATOR CUFF REPAIR AND BICEP TENDON REPAIR Left 09/12/2023   Procedure: LEFT SHOULDER ARTHROSCOPY, DEBRIDEMENT, MINI OPEN BICEP TENDON REPAIR AND ROTATOR CUFF TEAR REPAIR;  Surgeon: Cammy Copa, MD;  Location: [facility] OR;  Service: Orthopedics;  Laterality: Left;   Social History   Occupational History   Not on file  Tobacco Use   Smoking status: Former    Current packs/day: 0.00    Average packs/day: (0.2 ttl pk-yrs)    Types: Cigarettes    Start date: 01/29/2004    Quit date: 01/29/2019    Years since quitting: 4.6    Passive  exposure: Past   Smokeless tobacco: Never   Tobacco comments:    weekend smoker  Vaping Use   Vaping status: Some Days   Substances: Nicotine, Flavoring  Substance and Sexual Activity   Alcohol use: Yes    Alcohol/week: 2.0 standard drinks of alcohol    Types: 2 Standard drinks or equivalent per week    Comment: couple drinks on the weekends   Drug use: No   Sexual activity: Not Currently

## 2023-09-21 DIAGNOSIS — S43432A Superior glenoid labrum lesion of left shoulder, initial encounter: Secondary | ICD-10-CM

## 2023-09-21 DIAGNOSIS — M65912 Unspecified synovitis and tenosynovitis, left shoulder: Secondary | ICD-10-CM

## 2023-09-21 DIAGNOSIS — M7522 Bicipital tendinitis, left shoulder: Secondary | ICD-10-CM

## 2023-10-06 ENCOUNTER — Encounter: Payer: Self-pay | Admitting: Surgical

## 2023-10-06 ENCOUNTER — Ambulatory Visit (INDEPENDENT_AMBULATORY_CARE_PROVIDER_SITE_OTHER): Payer: 59

## 2023-10-06 ENCOUNTER — Ambulatory Visit: Admitting: Surgical

## 2023-10-06 DIAGNOSIS — Z9889 Other specified postprocedural states: Secondary | ICD-10-CM

## 2023-10-06 DIAGNOSIS — C61 Malignant neoplasm of prostate: Secondary | ICD-10-CM

## 2023-10-06 MED ORDER — OXYCODONE HCL 5 MG PO TABS: 5.0000 mg | ORAL_TABLET | Freq: Three times a day (TID) | ORAL | 0 refills | Status: AC | PRN

## 2023-10-06 MED ORDER — METHOCARBAMOL 750 MG PO TABS: 750.0000 mg | ORAL_TABLET | Freq: Every day | ORAL | 0 refills | Status: AC | PRN

## 2023-10-06 NOTE — Progress Notes (Signed)
 Post-Op Visit Note   Patient: Joshua [name]           Date of Birth: 1960-10-19           MRN: [id number] Visit Date: 10/06/2023 PCP: [facility], [facility]   Assessment & Plan:  Chief Complaint:  Chief Complaint  Patient presents with   Left Shoulder - Follow-up    Left shoulder scope 09/12/2023   Visit Diagnoses:  1. S/P left rotator cuff repair     Plan: Patient is a 63 year old male who presents s/p left shoulder arthroscopy with rotator cuff tear repair on 09/12/2023.  He was doing fairly well up until Tuesday when he fell without wearing his sling.  He did not land on the surgical shoulder, and said landing on the right arm but did impact the left arm to some degree.  He has had increased pain since the fall.  He has pain that wakes him up at night more often now.  Has run out of pain medication.  He denies any new mechanical symptoms in the shoulder.  On exam, patient has 30 degrees of external rotation, 90 degrees of abduction, 120 degrees of forward elevation passively.  He also has equivalent active range of motion with minimal crepitus and grinding noted on passive motion exam today.  He has excellent rotator cuff strength of supra, infra, subscap rated 5/5 at this time.  Incisions look to be healing well without evidence of infection or dehiscence.  Plan is to continue with referral to physical therapy.  He is okay to perform full passive range of motion and okay to start active assisted range of motion on 10/10/2023.  Okay to start full active range of motion and rotator cuff strengthening exercises on 10/24/23.  Follow-up for clinical recheck in 4 weeks.  Pain medicine refilled.  Follow-Up Instructions: No follow-ups on file.   Orders:  Orders Placed This Encounter  Procedures   Ambulatory referral to Physical Therapy   Meds ordered this encounter  Medications   methocarbamol (ROBAXIN) 750 MG tablet    Sig: Take 1 tablet (750 mg total) by mouth daily as needed for  muscle spasms.    Dispense:  30 tablet    Refill:  0   oxyCODONE (ROXICODONE) 5 MG immediate release tablet    Sig: Take 1 tablet (5 mg total) by mouth every 8 (eight) hours as needed for severe pain (pain score 7-10). Do not take at the same time as Klonopin    Dispense:  30 tablet    Refill:  0    Imaging: No results found.  PMFS History: Patient Active Problem List   Diagnosis Date Noted   Biceps tendonitis on left 09/21/2023   Degenerative superior labral anterior-to-posterior (SLAP) tear of left shoulder 09/21/2023   Synovitis of left shoulder 09/21/2023   History of colonic polyps 04/10/2023   Acute prostatitis 04/01/2023   DM (diabetes mellitus) (HCC) 03/31/2023   Demand ischemia of myocardium (HCC) 03/31/2023   Severe sepsis (HCC) 03/30/2023   Elevated troponin 03/30/2023   Paroxysmal SVT (supraventricular tachycardia) (HCC) 03/30/2023   High anion gap metabolic acidosis 03/30/2023   Hematuria 03/30/2023   S/p prostate biopsy 03/29/23 03/30/2023   Hypotension 03/30/2023   History of recurrent meningioma s/p resection x2 03/30/2023   Aortic atherosclerosis (HCC) 10/20/2022   Pain in limb 06/14/2022   Pain due to onychomycosis of toenails of both feet 03/04/2021   Arthritis of right shoulder region    S/P reverse  total shoulder arthroplasty, 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   Essential hypertension 07/02/2020   Prediabetes 07/02/2020   Polyuria 07/02/2020   Complete tear of left rotator cuff    Impingement syndrome of right shoulder    Neuropathy 12/05/2019   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   Seizures (HCC) 01/31/2019   Meningioma, recurrent of brain (HCC) 01/31/2019   Meningioma (HCC) 01/03/2019   Diffuse idiopathic skeletal hyperostosis 06/05/2013   Past Medical History:  Diagnosis Date   Anxiety    Aortic atherosclerosis (HCC)    Brain tumor (HCC) 02/20/2013   brain tumor removed in March 2014, Dr Rush Farmer   Diabetes mellitus without complication  (HCC)    Diffuse idiopathic skeletal hyperostosis 06/05/2013   Dizziness    Enlarged prostate    GERD (gastroesophageal reflux disease)    Headache(784.0)    scattered   High cholesterol    Hypertension    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   Meningioma (HCC)    Meningioma, recurrent of brain (HCC) 01/31/2019   Neuropathy 12/05/2019   Rotator cuff tear    right   Seizures (HCC)    11/27/19 last sz 1 wk ago    Family History  Problem Relation Age of Onset   Hypertension Mother    Dementia Mother    Diabetes Mother    Hypertension Father    CAD Father    Diabetes Father    CAD Brother     Past Surgical History:  Procedure Laterality Date   APPLICATION OF CRANIAL NAVIGATION N/A 01/10/2019   Procedure: APPLICATION OF CRANIAL NAVIGATION;  Surgeon: Maeola Harman, MD;  Location: [facility] OR;  Service: Neurosurgery;  Laterality: N/A;   CATARACT EXTRACTION Right    COLONOSCOPY WITH PROPOFOL N/A 04/10/2023   Procedure: COLONOSCOPY WITH PROPOFOL;  Surgeon: Toney Reil, MD;  Location: [facility] ENDOSCOPY;  Service: Gastroenterology;  Laterality: N/A;   CRANIOTOMY Right 11/06/2012   Procedure: CRANIOTOMY TUMOR EXCISION;  Surgeon: Maeola Harman, MD;  Location: [facility];  Service: Neurosurgery;  Laterality: Right;  Right Parasagittal craniotomy for meningioma with Stealth   CRANIOTOMY Right 01/10/2019   Procedure: Right Parasagittal Craniotomy for Tumor;  Surgeon: Maeola Harman, MD;  Location: [facility] OR;  Service: Neurosurgery;  Laterality: Right;  Right parasagittal craniotomy for tumor   ESOPHAGOGASTRODUODENOSCOPY     JOINT REPLACEMENT Right 2018   right hip   PROSTATE BIOPSY  03/2023   REVERSE SHOULDER ARTHROPLASTY Right 01/08/2021   Procedure: RIGHT REVERSE SHOULDER ARTHROPLASTY;  Surgeon: Cammy Copa, MD;  Location: [facility] OR;  Service: Orthopedics;  Laterality: Right;   right knee arthroscopy     SHOULDER ARTHROSCOPY Right 06/09/2020   Procedure: RIGHT  SHOULDER ARTHROSCOPY AND DEBRIDEMENT;  Surgeon: Nadara Mustard, MD;  Location: [facility];  Service: Orthopedics;  Laterality: Right;   SHOULDER ARTHROSCOPY WITH SUBACROMIAL DECOMPRESSION, ROTATOR CUFF REPAIR AND BICEP TENDON REPAIR Left 09/12/2023   Procedure: LEFT SHOULDER ARTHROSCOPY, DEBRIDEMENT, MINI OPEN BICEP TENDON REPAIR AND ROTATOR CUFF TEAR REPAIR;  Surgeon: Cammy Copa, MD;  Location: [facility] OR;  Service: Orthopedics;  Laterality: Left;   Social History   Occupational History   Not on file  Tobacco Use   Smoking status: Former    Current packs/day: 0.00    Average packs/day: (0.2 ttl pk-yrs)    Types: Cigarettes    Start date: 01/29/2004    Quit date: 01/29/2019    Years since quitting: 4.6    Passive exposure: Past  Smokeless tobacco: Never   Tobacco comments:    weekend smoker  Vaping Use   Vaping status: Some Days   Substances: Nicotine, Flavoring  Substance and Sexual Activity   Alcohol use: Yes    Alcohol/week: 2.0 standard drinks of alcohol    Types: 2 Standard drinks or equivalent per week    Comment: couple drinks on the weekends   Drug use: No   Sexual activity: Not Currently

## 2023-10-07 LAB — FPSA% REFLEX
% FREE PSA: 11.3 %
PSA, FREE: 0.53 ng/mL

## 2023-10-07 LAB — PSA TOTAL (REFLEX TO FREE): Prostate Specific Ag, Serum: 4.7 ng/mL — ABNORMAL HIGH (ref 0.0–4.0)

## 2023-10-09 ENCOUNTER — Telehealth: Payer: Self-pay | Admitting: Surgical

## 2023-10-09 NOTE — Telephone Encounter (Signed)
 Patient called and said that he is having complications with the left shoulder. CB#[phone]

## 2023-10-09 NOTE — Telephone Encounter (Signed)
 Patient needs follow up appointment with Franky Macho

## 2023-10-10 ENCOUNTER — Other Ambulatory Visit (HOSPITAL_BASED_OUTPATIENT_CLINIC_OR_DEPARTMENT_OTHER)

## 2023-10-10 NOTE — Telephone Encounter (Signed)
 Lvm for pt to cb to see if he wants earlier appt then 4/17

## 2023-10-12 ENCOUNTER — Ambulatory Visit: Payer: Self-pay | Admitting: Urology

## 2023-10-18 ENCOUNTER — Ambulatory Visit (INDEPENDENT_AMBULATORY_CARE_PROVIDER_SITE_OTHER): Admitting: Urology

## 2023-10-18 VITALS — BP 152/83 | HR 71 | Ht 69.0 in | Wt 189.0 lb

## 2023-10-18 DIAGNOSIS — R351 Nocturia: Secondary | ICD-10-CM | POA: Diagnosis not present

## 2023-10-18 DIAGNOSIS — C61 Malignant neoplasm of prostate: Secondary | ICD-10-CM | POA: Diagnosis not present

## 2023-10-18 DIAGNOSIS — N401 Enlarged prostate with lower urinary tract symptoms: Secondary | ICD-10-CM

## 2023-10-18 MED ORDER — TAMSULOSIN HCL 0.4 MG PO CAPS: 0.4000 mg | ORAL_CAPSULE | Freq: Every day | ORAL | 3 refills | Status: DC

## 2023-10-18 NOTE — Progress Notes (Signed)
   10/18/2023 9:35 AM   Joshua [name] 1960-09-03 [id number]  Reason for visit: Follow up low risk prostate cancer on surveillance, urinary symptoms, ED  HPI: 63 year old male with a slow rise in PSA to 3.9 in April 2023 and he requested a prostate MRI.  Prostate MRI 03/10/2022 showed a 51 g prostate with a PI-RADS 4 lesion in the left posterior lateral peripheral zone, he underwent a cognitive biopsy in September 2023 which showed 4 cores of low risk Gleason score 3+3=6 prostate cancer with 60% max core involvement.  He opted for active surveillance. PSA in May 2024 was 5.6, and he opted for an MRI fusion confirmatory biopsy.  He underwent MRI fusion biopsy on 03/29/2023, 3 biopsies were taken from the PI-RADS 4 ROI#1 as well as the standard 12 core biopsy.  The pathology from the ROI was benign, and a total of 6/12 standard cores showed low risk Gleason score 3+3=6 disease with max core involvement of 33%.  He did have post biopsy sepsis and was admitted to [facility] for antibiotics at that time.  PSA from 10/06/2023 4.7 which is decreased from 5.6 previously and overall stable.  Reassurance was provided and he would like to continue active surveillance which I think is very reasonable.  Continues on Flomax for mild urinary symptoms of weak stream with good results, no longer needing Cialis for ED  Continue active surveillance for low risk prostate cancer Flomax refilled for urinary symptoms RTC 9 months PSA prior, PVR   Sondra Come, MD  [facility] [address], [address] [place], [place] [address] [phone]

## 2023-11-02 ENCOUNTER — Ambulatory Visit (INDEPENDENT_AMBULATORY_CARE_PROVIDER_SITE_OTHER): Admitting: Surgical

## 2023-11-02 ENCOUNTER — Other Ambulatory Visit: Payer: Self-pay

## 2023-11-02 ENCOUNTER — Other Ambulatory Visit (INDEPENDENT_AMBULATORY_CARE_PROVIDER_SITE_OTHER)

## 2023-11-02 DIAGNOSIS — Z9889 Other specified postprocedural states: Secondary | ICD-10-CM

## 2023-11-02 DIAGNOSIS — M7552 Bursitis of left shoulder: Secondary | ICD-10-CM

## 2023-11-02 DIAGNOSIS — M25512 Pain in left shoulder: Secondary | ICD-10-CM | POA: Diagnosis not present

## 2023-11-02 NOTE — Progress Notes (Signed)
 Us/

## 2023-11-03 ENCOUNTER — Other Ambulatory Visit (HOSPITAL_BASED_OUTPATIENT_CLINIC_OR_DEPARTMENT_OTHER)

## 2023-11-05 ENCOUNTER — Encounter: Payer: Self-pay | Admitting: Surgical

## 2023-11-05 MED ORDER — BUPIVACAINE HCL 0.5 % IJ SOLN
4.0000 mL | INTRAMUSCULAR | Status: AC | PRN
  Administered 2023-11-02: 4 mL via INTRA_ARTICULAR

## 2023-11-05 MED ORDER — LIDOCAINE HCL 1 % IJ SOLN
5.0000 mL | INTRAMUSCULAR | Status: AC | PRN
  Administered 2023-11-02: 5 mL

## 2023-11-05 NOTE — Progress Notes (Signed)
 Post-Op Visit Note   Patient: Joshua [name]           Date of Birth: Aug 09, 1960           MRN: [id number] Visit Date: 11/02/2023 PCP: [facility], [facility]   Assessment & Plan:  Chief Complaint:  Chief Complaint  Patient presents with   Left Shoulder - Follow-up, Routine Post Op    09/12/2023 left shoulder arthroscopy with rotator cuff repair   Visit Diagnoses:  1. Acute pain of left shoulder   2. S/P left rotator cuff repair     Plan: Patient is a 63 year old male who presents s/p left shoulder arthroscopy with rotator cuff tear repair in 09/12/2023.  He was seen about a month ago and was overall doing well at that time aside from the fall he had several days prior.  Today he states that he does not feel he is progressing like he was prior to the fall.  He feels like ever since that fall he has had increased pain and the shoulder has been more stiff for him.  He is not sleeping well at night.  Still using CPM machine.  Not working with physical therapy yet.  On exam, patient has 35 degrees external rotation, 90 degrees abduction, 125 degrees forward elevation passively.  He does have good rotator cuff strength of supra, infra, subscap rated 5/5.  No definitive Popeye deformity noted.  Incisions are healing well without evidence of infection or dehiscence.  Axillary nerves intact with deltoid firing.  More so than at the last appointment, the shoulder does seem swollen and have more prominence in the anterior aspect of the anterior deltoid region.  Ultrasound examination today demonstrates hypoechoic and isoechoic signal in the subacromial-subdeltoid bursa with no significant fluid noted in the posterior recess of the glenohumeral joint.  The infraspinatus tendinous attachment seems to be intact with some of the supraspinatus tendon attachment visualized but the very anterior aspect of the tendon is not as well-visualized though this may be secondary to patient's shoulder pain and  stiffness limiting full visualization.  The fluid in the subacromial subdeltoid bursa was aspirated today under ultrasound guidance, aspirating about 65 cc of sanguinous fluid.  There is no evidence of purulence.  Commendation of 4 cc bupivacaine  mixed with 1 cc of Toradol  was administered for symptomatic relief.  We will obtain MRI arthrogram of the left shoulder for further evaluation of potential retear of the rotator cuff following his fall.  Follow-up after MRI to review results.    Procedure Note  Patient: Joshua [name]             Date of Birth: 02/01/1961           MRN: [id number]             Visit Date: 11/02/2023  Procedures: Visit Diagnoses:  1. Acute pain of left shoulder   2. S/P left rotator cuff repair     Large Joint Inj: L subacromial bursa on 11/02/2023 12:12 PM Indications: diagnostic evaluation and pain Details: 18 G 1.5 in needle, ultrasound-guided anterolateral approach  Arthrogram: No  Medications: 4 mL bupivacaine  0.5 %; 5 mL lidocaine  1 % Aspirate: 65 mL bloody Outcome: tolerated well, no immediate complications Procedure, treatment alternatives, risks and benefits explained, specific risks discussed. Consent was given by the patient. Immediately prior to procedure a time out was called to verify the correct patient, procedure, equipment, support staff and site/side marked as required. Patient was prepped and  draped in the usual sterile fashion.         Follow-Up Instructions: No follow-ups on file.   Orders:  Orders Placed This Encounter  Procedures   XR Shoulder Left   US  Guided Needle Placement - No Linked Charges   MR Shoulder Left w/ contrast   DL FLUORO GUIDED NEEDLE PLC ASPIRATION / INJECTTION/LOC   No orders of the defined types were placed in this encounter.   Imaging: No results found.  PMFS History: Patient Active Problem List   Diagnosis Date Noted   Biceps tendonitis on left 09/21/2023   Degenerative superior labral  anterior-to-posterior (SLAP) tear of left shoulder 09/21/2023   Synovitis of left shoulder 09/21/2023   History of colonic polyps 04/10/2023   Acute prostatitis 04/01/2023   DM (diabetes mellitus) (HCC) 03/31/2023   Demand ischemia of myocardium (HCC) 03/31/2023   Severe sepsis (HCC) 03/30/2023   Elevated troponin 03/30/2023   Paroxysmal SVT (supraventricular tachycardia) (HCC) 03/30/2023   High anion gap metabolic acidosis 03/30/2023   Hematuria 03/30/2023   S/p prostate biopsy 03/29/23 03/30/2023   Hypotension 03/30/2023   History of recurrent meningioma s/p resection x2 03/30/2023   Aortic atherosclerosis (HCC) 10/20/2022   Pain in limb 06/14/2022   Pain due to onychomycosis of toenails of both feet 03/04/2021   Arthritis of right shoulder region    S/P reverse total shoulder arthroplasty, 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   Essential hypertension 07/02/2020   Prediabetes 07/02/2020   Polyuria 07/02/2020   Complete tear of left rotator cuff    Impingement syndrome of right shoulder    Neuropathy 12/05/2019   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   Seizures (HCC) 01/31/2019   Meningioma, recurrent of brain (HCC) 01/31/2019   Meningioma (HCC) 01/03/2019   Diffuse idiopathic skeletal hyperostosis 06/05/2013   Past Medical History:  Diagnosis Date   Anxiety    Aortic atherosclerosis (HCC)    Brain tumor (HCC) 02/20/2013   brain tumor removed in March 2014, Dr Derotha Fletcher   Diabetes mellitus without complication (HCC)    Diffuse idiopathic skeletal hyperostosis 06/05/2013   Dizziness    Enlarged prostate    GERD (gastroesophageal reflux disease)    Headache(784.0)    scattered   High cholesterol    Hypertension    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   Meningioma (HCC)    Meningioma, recurrent of brain (HCC) 01/31/2019   Neuropathy 12/05/2019   Rotator cuff tear    right   Seizures (HCC)    11/27/19 last sz 1 wk ago    Family History  Problem  Relation Age of Onset   Hypertension Mother    Dementia Mother    Diabetes Mother    Hypertension Father    CAD Father    Diabetes Father    CAD Brother     Past Surgical History:  Procedure Laterality Date   APPLICATION OF CRANIAL NAVIGATION N/A 01/10/2019   Procedure: APPLICATION OF CRANIAL NAVIGATION;  Surgeon: Manya Sells, MD;  Location: [facility] OR;  Service: Neurosurgery;  Laterality: N/A;   CATARACT EXTRACTION Right    COLONOSCOPY WITH PROPOFOL  N/A 04/10/2023   Procedure: COLONOSCOPY WITH PROPOFOL ;  Surgeon: Selena Daily, MD;  Location: [facility] ENDOSCOPY;  Service: Gastroenterology;  Laterality: N/A;   CRANIOTOMY Right 11/06/2012   Procedure: CRANIOTOMY TUMOR EXCISION;  Surgeon: Manya Sells, MD;  Location: [facility] ORS;  Service: Neurosurgery;  Laterality: Right;  Right Parasagittal craniotomy for meningioma with Stealth   CRANIOTOMY Right  01/10/2019   Procedure: Right Parasagittal Craniotomy for Tumor;  Surgeon: Manya Sells, MD;  Location: [facility] OR;  Service: Neurosurgery;  Laterality: Right;  Right parasagittal craniotomy for tumor   ESOPHAGOGASTRODUODENOSCOPY     JOINT REPLACEMENT Right 2018   right hip   PROSTATE BIOPSY  03/2023   REVERSE SHOULDER ARTHROPLASTY Right 01/08/2021   Procedure: RIGHT REVERSE SHOULDER ARTHROPLASTY;  Surgeon: Jasmine Mesi, MD;  Location: [facility] OR;  Service: Orthopedics;  Laterality: Right;   right knee arthroscopy     SHOULDER ARTHROSCOPY Right 06/09/2020   Procedure: RIGHT SHOULDER ARTHROSCOPY AND DEBRIDEMENT;  Surgeon: Timothy Ford, MD;  Location: [facility];  Service: Orthopedics;  Laterality: Right;   SHOULDER ARTHROSCOPY WITH SUBACROMIAL DECOMPRESSION, ROTATOR CUFF REPAIR AND BICEP TENDON REPAIR Left 09/12/2023   Procedure: LEFT SHOULDER ARTHROSCOPY, DEBRIDEMENT, MINI OPEN BICEP TENDON REPAIR AND ROTATOR CUFF TEAR REPAIR;  Surgeon: Jasmine Mesi, MD;  Location: [facility] OR;  Service: Orthopedics;  Laterality: Left;    Social History   Occupational History   Not on file  Tobacco Use   Smoking status: Former    Current packs/day: 0.00    Average packs/day: (0.2 ttl pk-yrs)    Types: Cigarettes    Start date: 01/29/2004    Quit date: 01/29/2019    Years since quitting: 4.7    Passive exposure: Past   Smokeless tobacco: Never   Tobacco comments:    weekend smoker  Vaping Use   Vaping status: Some Days   Substances: Nicotine, Flavoring  Substance and Sexual Activity   Alcohol use: Yes    Alcohol/week: 2.0 standard drinks of alcohol    Types: 2 Standard drinks or equivalent per week    Comment: couple drinks on the weekends   Drug use: No   Sexual activity: Not Currently

## 2023-11-10 ENCOUNTER — Other Ambulatory Visit

## 2023-11-10 ENCOUNTER — Emergency Department (HOSPITAL_BASED_OUTPATIENT_CLINIC_OR_DEPARTMENT_OTHER)
Admission: EM | Admit: 2023-11-10 | Discharge: 2023-11-10 | Disposition: A | Discharge status: Home / Self Care | Attending: Emergency Medicine | Admitting: Emergency Medicine

## 2023-11-10 ENCOUNTER — Inpatient Hospital Stay: Admission: RE | Admit: 2023-11-10 | Source: Ambulatory Visit

## 2023-11-10 ENCOUNTER — Other Ambulatory Visit: Payer: Self-pay

## 2023-11-10 DIAGNOSIS — Z7984 Long term (current) use of oral hypoglycemic drugs: Secondary | ICD-10-CM | POA: Insufficient documentation

## 2023-11-10 DIAGNOSIS — E162 Hypoglycemia, unspecified: Secondary | ICD-10-CM

## 2023-11-10 DIAGNOSIS — E11649 Type 2 diabetes mellitus with hypoglycemia without coma: Secondary | ICD-10-CM | POA: Diagnosis not present

## 2023-11-10 DIAGNOSIS — Z79899 Other long term (current) drug therapy: Secondary | ICD-10-CM | POA: Insufficient documentation

## 2023-11-10 DIAGNOSIS — I10 Essential (primary) hypertension: Secondary | ICD-10-CM | POA: Insufficient documentation

## 2023-11-10 DIAGNOSIS — Z7982 Long term (current) use of aspirin: Secondary | ICD-10-CM | POA: Insufficient documentation

## 2023-11-10 LAB — CBC
HCT: 45.8 % (ref 39.0–52.0)
Hemoglobin: 15.5 g/dL (ref 13.0–17.0)
MCH: 30.8 pg (ref 26.0–34.0)
MCHC: 33.8 g/dL (ref 30.0–36.0)
MCV: 90.9 fL (ref 80.0–100.0)
Platelets: 287 10*3/uL (ref 150–400)
RBC: 5.04 MIL/uL (ref 4.22–5.81)
RDW: 12.9 % (ref 11.5–15.5)
WBC: 10.1 10*3/uL (ref 4.0–10.5)
nRBC: 0 % (ref 0.0–0.2)

## 2023-11-10 LAB — BASIC METABOLIC PANEL WITH GFR
Anion gap: 13 (ref 5–15)
BUN: 15 mg/dL (ref 8–23)
CO2: 23 mmol/L (ref 22–32)
Calcium: 9.7 mg/dL (ref 8.9–10.3)
Chloride: 105 mmol/L (ref 98–111)
Creatinine, Ser: 0.98 mg/dL (ref 0.61–1.24)
GFR, Estimated: >60 mL/min (ref >60)
Glucose, Bld: 96 mg/dL (ref 70–99)
Potassium: 4 mmol/L (ref 3.5–5.1)
Sodium: 140 mmol/L (ref 135–145)

## 2023-11-10 LAB — CBG MONITORING, ED: Glucose-Capillary: 100 mg/dL — ABNORMAL HIGH (ref 70–99)

## 2023-11-10 NOTE — ED Triage Notes (Signed)
 Pt POV reporting feeling jittery and tired, blood sugar 63 at home, came to ED for evaluation.

## 2023-11-10 NOTE — ED Provider Notes (Signed)
 [place] [facility] AT [facility] Provider Note   CSN: [id number] Arrival date & time: 11/10/23  1835     History  Chief Complaint  Patient presents with   Hypoglycemia    Joshua [name] is a 63 y.o. male.  Patient with past medical history of arthrosclerosis, brain tumor, hypertension, diabetes, GERD presenting to emergency room with complaint of hypoglycemia.  Patient reports that earlier today he was feeling fatigued and he checked his blood sugar and his blood sugar 63 at home.  He drank some pop ate some okra and some ravioli and then drove to emergency room.  On arrival to ER his blood sugar had improved to 100.  He reports he has been observed for short period of time and had not had any repeat episodes of this and he would like to go home.  Denies abdominal pain nausea vomiting diarrhea.  He thinks that the started because he has not been eating 3 meals a day has been trying to cut back and has done some diet change.  He is not on insulin .   Hypoglycemia      Home Medications Prior to Admission medications   Medication Sig Start Date End Date Taking? Authorizing Provider  ACCU-CHEK GUIDE TEST test strip CHECK BLOOD SUGAR ONCE IN THE MORNING WHILE FASTING 06/30/23   [provider]  Accu-Chek Softclix Lancets lancets every morning. 06/30/23   [provider]  acetaminophen  (TYLENOL ) 500 MG tablet Take 1,000 mg by mouth every 6 (six) hours as needed for moderate pain or headache.    [provider]  albuterol  (VENTOLIN  HFA) 108 (90 Base) MCG/ACT inhaler Inhale 2 puffs into the lungs every 6 (six) hours as needed for wheezing or shortness of breath. 08/13/21   Joaquin Mulberry, MD  amLODipine  (NORVASC ) 5 MG tablet TAKE 1 TABLET (5 MG TOTAL) BY MOUTH DAILY. TO LOWER BLOOD PRESSURE. STOP LOSARTAN  08/13/21   Joaquin Mulberry, MD  aspirin  81 MG chewable tablet Chew 81 mg by mouth daily.    [provider]  atorvastatin   (LIPITOR) 20 MG tablet TAKE 1 TABLET (20 MG TOTAL) BY MOUTH DAILY. 12/08/21   Newlin, Enobong, MD  clonazePAM  (KLONOPIN ) 0.5 MG tablet TAKE 1 TABLET (0.5 MG TOTAL) BY MOUTH AT BEDTIME. Patient taking differently: Take 0.5 mg by mouth at bedtime as needed for anxiety. 05/23/23   Vaslow, Zachary K, MD  gabapentin  (NEURONTIN ) 300 MG capsule Take 300 mg by mouth 3 (three) times daily as needed (nerve pain). 07/30/23   [provider]  Lacosamide  100 MG TABS SMARTSIG:1 Tablet(s) By Mouth Morning-Night 09/04/23   [provider]  lamoTRIgine  (LAMICTAL ) 200 MG tablet Take 1 tablet (200 mg total) by mouth 2 (two) times daily. 02/20/23   Vaslow, Zachary K, MD  lidocaine  (LIDODERM ) 5 % Place 1 patch onto the skin daily. Remove & Discard patch within 12 hours or as directed by MD Patient taking differently: Place 1 patch onto the skin daily as needed (pain). Remove & Discard patch within 12 hours or as directed by MD 04/07/22   Lucina Sabal, PA-C  meclizine  (ANTIVERT ) 25 MG tablet Take 1 tablet (25 mg total) by mouth 3 (three) times daily as needed for dizziness. 01/04/22   Horton, Sidra Dredge, DO  meloxicam  (MOBIC ) 15 MG tablet Take 1 tablet (15 mg total) by mouth daily. 09/12/23 09/11/24  Magnant, Charles L, PA-C  metFORMIN  (GLUCOPHAGE ) 500 MG tablet Take 1 tablet (500 mg total) by mouth 2 (two)  times daily. 08/13/21   Newlin, Enobong, MD  methocarbamol  (ROBAXIN ) 750 MG tablet Take 1 tablet (750 mg total) by mouth daily as needed for muscle spasms. 10/06/23   Magnant, Charles L, PA-C  omeprazole  (PRILOSEC) 20 MG capsule Take 20 mg by mouth daily. 08/29/22   [provider]  oxyCODONE  (ROXICODONE ) 5 MG immediate release tablet Take 1 tablet (5 mg total) by mouth every 8 (eight) hours as needed for severe pain (pain score 7-10). Do not take at the same time as Klonopin  10/06/23   Magnant, Ethelle Herb L, PA-C  propranolol  (INDERAL ) 10 MG tablet Take 1 tablet (10 mg total) by mouth 3 (three) times daily as  needed (palpitations). 05/31/22   Swinyer, Leilani Punter, NP  tadalafil  (CIALIS ) 5 MG tablet Take 1 tablet (5 mg total) by mouth daily as needed for erectile dysfunction. 1 hour prior to sexual activity 01/17/22   Lawerence Pressman, MD  tamsulosin  (FLOMAX ) 0.4 MG CAPS capsule Take 1 capsule (0.4 mg total) by mouth daily. 10/18/23   Lawerence Pressman, MD  VITAMIN A PO Take 1 capsule by mouth daily.    [provider]      Allergies    Patient has no known allergies.    Review of Systems   Review of Systems  Constitutional:  Positive for activity change.    Physical Exam Updated Vital Signs BP 128/82 (BP Location: Right Arm)   Pulse 96   Temp 98.7 F (37.1 C) (Oral)   Resp 18   Ht 5\' 9"  (1.753 m)   Wt 77.1 kg   SpO2 97%   BMI 25.10 kg/m  Physical Exam Vitals and nursing note reviewed.  Constitutional:      General: He is not in acute distress.    Appearance: He is not toxic-appearing.  HENT:     Head: Normocephalic and atraumatic.  Eyes:     General: No scleral icterus.    Conjunctiva/sclera: Conjunctivae normal.  Cardiovascular:     Rate and Rhythm: Normal rate and regular rhythm.     Pulses: Normal pulses.     Heart sounds: Normal heart sounds.  Pulmonary:     Effort: Pulmonary effort is normal. No respiratory distress.     Breath sounds: Normal breath sounds.  Abdominal:     General: Abdomen is flat. Bowel sounds are normal.     Palpations: Abdomen is soft.     Tenderness: There is no abdominal tenderness.  Musculoskeletal:     Right lower leg: No edema.     Left lower leg: No edema.  Skin:    General: Skin is warm and dry.     Findings: No lesion.  Neurological:     General: No focal deficit present.     Mental Status: He is alert and oriented to person, place, and time. Mental status is at baseline.     ED Results / Procedures / Treatments   Labs (all labs ordered are listed, but only abnormal results are displayed) Labs Reviewed  CBG MONITORING, ED -  Abnormal; Notable for the following components:      Result Value   Glucose-Capillary 100 (*)    All other components within normal limits  CBC  BASIC METABOLIC PANEL WITH GFR  CBG MONITORING, ED    EKG None  Radiology No results found.  Procedures Procedures    Medications Ordered in ED Medications - No data to display  ED Course/ Medical Decision Making/ A&P  Medical Decision Making Amount and/or Complexity of Data Reviewed Labs: ordered.   This patient presents to the ED for concern of hypoglycemia, this involves an extensive number of treatment options, and is a complaint that carries with it a high risk of complications and morbidity.  The differential diagnosis includes dehydration, medication side effect, insulin  over use, insulinoma    Lab Tests:  I personally interpreted labs.  The pertinent results include:   CBC without leukocytosis BMP without anion gap or electrolyte abnormality.  Glucose 100    Problem List / ED Course / Critical interventions / Medication management  Presenting to emergency room with complaint of hypoglycemia.  Patient reports when he checked his blood sugar at home a while he is feeling fatigued and jittery he found his blood sugar was 63.  He does report that he thinks his machine was old and needed battery changed however on arrival after he ate he and his blood sugar had improved to 100.  He is not observed for approximately 4 hours in ER had reassuring lab work.  Repeat CBG continues to improve with no repeated hypoglycemia.  Otherwise hemodynamically stable and tolerating oral intake.  Feels appropriate for outpatient follow-up.  He reports he has follow-up next week.  He will try and eat three regular meals throughout the day. Given return precautions. Lungs CTAB, no SOB. No CP. No abdominal pain thus do not feel further workup in ER is needed at this time.   I have reviewed the patients home medicines  and have made adjustments as needed   Plan  F/u w/ PCP in 2-3d to ensure resolution of sx.  Patient was given return precautions. Patient stable for discharge at this time.  Patient educated on sx/dx and verbalized understanding of plan. Return to ER w/ new or worsening sx.          Final Clinical Impression(s) / ED Diagnoses Final diagnoses:  Hypoglycemia    Rx / DC Orders ED Discharge Orders     None         Suzanne Erps Allison Ivory 11/10/23 2206    Tonya Fredrickson, MD 11/11/23 1143

## 2023-11-10 NOTE — Discharge Instructions (Addendum)
 How is this treated? Treating low blood sugar If you have low blood sugar, eat or drink something with sugar in it right away. The food or drink should have 15 grams of a fast-acting carbohydrate (carb). Options include: 4 oz (120 mL) of fruit juice. 4 oz (120 mL) of soda (not diet soda). A few pieces of hard candy. Check food labels to see how many pieces to eat. 1 Tbsp (15 mL) of sugar or honey. 4 glucose tablets. 1 tube of glucose gel.

## 2023-11-10 NOTE — ED Notes (Signed)
 CBG reading 80. Pt was given pack of crackers and orange juice to drink prior to leaving. Pt has no complaints and states he feels good and ready to go home.

## 2023-11-11 LAB — CBG MONITORING, ED: Glucose-Capillary: 80 mg/dL (ref 70–99)

## 2023-11-20 ENCOUNTER — Inpatient Hospital Stay: Payer: 59 | Attending: Internal Medicine | Admitting: Internal Medicine

## 2023-11-20 ENCOUNTER — Ambulatory Visit
Admission: RE | Admit: 2023-11-20 | Discharge: 2023-11-20 | Disposition: A | Discharge status: Home / Self Care | Source: Ambulatory Visit | Attending: Surgical

## 2023-11-20 ENCOUNTER — Ambulatory Visit
Admission: RE | Admit: 2023-11-20 | Discharge: 2023-11-20 | Disposition: A | Discharge status: Home / Self Care | Source: Ambulatory Visit | Attending: Surgical | Admitting: Surgical

## 2023-11-20 DIAGNOSIS — M25512 Pain in left shoulder: Secondary | ICD-10-CM

## 2023-11-20 DIAGNOSIS — Z9889 Other specified postprocedural states: Secondary | ICD-10-CM

## 2023-11-20 DIAGNOSIS — F17290 Nicotine dependence, other tobacco product, uncomplicated: Secondary | ICD-10-CM | POA: Insufficient documentation

## 2023-11-20 DIAGNOSIS — G40109 Localization-related (focal) (partial) symptomatic epilepsy and epileptic syndromes with simple partial seizures, not intractable, without status epilepticus: Secondary | ICD-10-CM | POA: Insufficient documentation

## 2023-11-20 DIAGNOSIS — C709 Malignant neoplasm of meninges, unspecified: Secondary | ICD-10-CM | POA: Insufficient documentation

## 2023-11-20 DIAGNOSIS — S46012A Strain of muscle(s) and tendon(s) of the rotator cuff of left shoulder, initial encounter: Secondary | ICD-10-CM | POA: Diagnosis not present

## 2023-11-20 DIAGNOSIS — Z79899 Other long term (current) drug therapy: Secondary | ICD-10-CM | POA: Insufficient documentation

## 2023-11-20 MED ORDER — IOPAMIDOL (ISOVUE-M 200) INJECTION 41%
10.0000 mL | Freq: Once | INTRAMUSCULAR | Status: AC
  Administered 2023-11-20: 10 mL via INTRA_ARTICULAR

## 2023-11-22 ENCOUNTER — Telehealth: Payer: Self-pay

## 2023-11-22 NOTE — Progress Notes (Signed)
 [name], can we get thin cut CT scan of left shoulder of Joshua [name] and have him follow-up with Dr. Rozelle Corning to discuss reverse shoulder replacement?

## 2023-11-22 NOTE — Telephone Encounter (Signed)
-----   Message from [facility] sent at 11/22/2023  3:48 PM EDT ----- [name], can we get thin cut CT scan of left shoulder of Joshua [name] and have him follow-up with Dr. Rozelle Corning to discuss reverse shoulder replacement?

## 2023-11-23 ENCOUNTER — Other Ambulatory Visit: Payer: Self-pay | Admitting: Radiology

## 2023-11-23 DIAGNOSIS — Z9181 History of falling: Secondary | ICD-10-CM | POA: Diagnosis not present

## 2023-11-23 DIAGNOSIS — I10 Essential (primary) hypertension: Secondary | ICD-10-CM | POA: Diagnosis not present

## 2023-11-23 DIAGNOSIS — M19012 Primary osteoarthritis, left shoulder: Secondary | ICD-10-CM

## 2023-11-23 DIAGNOSIS — E7800 Pure hypercholesterolemia, unspecified: Secondary | ICD-10-CM | POA: Diagnosis not present

## 2023-11-23 DIAGNOSIS — R7303 Prediabetes: Secondary | ICD-10-CM | POA: Diagnosis not present

## 2023-11-23 DIAGNOSIS — F17210 Nicotine dependence, cigarettes, uncomplicated: Secondary | ICD-10-CM | POA: Diagnosis not present

## 2023-11-23 NOTE — Telephone Encounter (Signed)
 Joshua [name]

## 2023-11-23 NOTE — Telephone Encounter (Signed)
 Order placed with thin cut CT instructions.

## 2023-11-29 ENCOUNTER — Telehealth: Payer: Self-pay | Admitting: Internal Medicine

## 2023-11-29 ENCOUNTER — Ambulatory Visit
Admission: RE | Admit: 2023-11-29 | Discharge: 2023-11-29 | Disposition: A | Discharge status: Home / Self Care | Source: Ambulatory Visit | Attending: Surgical | Admitting: Surgical

## 2023-11-29 DIAGNOSIS — M19012 Primary osteoarthritis, left shoulder: Secondary | ICD-10-CM

## 2023-11-29 DIAGNOSIS — M7552 Bursitis of left shoulder: Secondary | ICD-10-CM | POA: Diagnosis not present

## 2023-11-29 DIAGNOSIS — S46012A Strain of muscle(s) and tendon(s) of the rotator cuff of left shoulder, initial encounter: Secondary | ICD-10-CM | POA: Diagnosis not present

## 2023-11-29 DIAGNOSIS — M25512 Pain in left shoulder: Secondary | ICD-10-CM | POA: Diagnosis not present

## 2023-11-29 LAB — GLUCOSE, POCT (MANUAL RESULT ENTRY): Glucose Fasting, POC: 108 mg/dL — AB (ref 70–99)

## 2023-11-29 NOTE — Progress Notes (Signed)
 Patient came to mobile screening at [facility]. Pt stated he does take BP medication but has not done so in the last few days. He has been prescribed a new dosage. Last visit to his Dr. Rose Conception 10/18/2023. Glucose levels fasting 108. We discussed maintaining a healthy lifestyle and exercise. Pt declined SDOH.

## 2023-11-29 NOTE — Telephone Encounter (Signed)
 Rescheduled

## 2023-12-04 ENCOUNTER — Inpatient Hospital Stay (HOSPITAL_BASED_OUTPATIENT_CLINIC_OR_DEPARTMENT_OTHER): Admitting: Internal Medicine

## 2023-12-04 VITALS — BP 144/81 | HR 60 | Temp 98.7°F | Resp 18 | Wt 182.1 lb

## 2023-12-04 DIAGNOSIS — G40109 Localization-related (focal) (partial) symptomatic epilepsy and epileptic syndromes with simple partial seizures, not intractable, without status epilepticus: Secondary | ICD-10-CM | POA: Diagnosis not present

## 2023-12-04 DIAGNOSIS — R569 Unspecified convulsions: Secondary | ICD-10-CM

## 2023-12-04 DIAGNOSIS — C709 Malignant neoplasm of meninges, unspecified: Secondary | ICD-10-CM | POA: Diagnosis not present

## 2023-12-04 DIAGNOSIS — Z79899 Other long term (current) drug therapy: Secondary | ICD-10-CM | POA: Diagnosis not present

## 2023-12-04 DIAGNOSIS — D329 Benign neoplasm of meninges, unspecified: Secondary | ICD-10-CM | POA: Diagnosis not present

## 2023-12-04 DIAGNOSIS — F17290 Nicotine dependence, other tobacco product, uncomplicated: Secondary | ICD-10-CM | POA: Diagnosis not present

## 2023-12-04 NOTE — Progress Notes (Signed)
 [facility] [address]  [place], [place] [address] [phone]   Interval Evaluation  Date of Service: 12/04/23 Patient Name: Joshua [name] Patient MRN: [id number] Patient DOB: 03-Feb-1961 Provider: Mamie Searles, MD  Identifying Statement:  Joshua [name] is a 63 y.o. 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 Nigel Bart (WHO I) 01/10/19: Repeat craniotomy, debulking resection by Dr. Nigel Bart after tumor recurrence, seizures.  Path demonstrates islands of anaplasia c/w grade II/III. 04/05/19: Completes post-operative IMRT with Dr. Jeryl Moris  Interval History:  Joshua [name] presents today for follow up. No new or progressive changes today.  He remains essentially seizure-free on Lamictal  200mg  BID; he does report one aura or brief sensory seizure in past 6 months.  Klonopin  he has been dosing 0.5mg  at night.  Remains physically active, engaged.  H+P (02/05/19) Patient presents to review clinical course and care for his meningioma.  Initially he presented in 2014 with headache syndrome, was found to have tumor on imaging which was resected by Dr. Nigel Bart.  The patient was lost to follow up for ~5 years until he developed seizures, described as "twitching of left leg spreading to arm" this past month.  He required loads of Keppra  and Dilantin  to break events.  MRI demonstrated significant regrowth of the mass, and repeat craniotomy was performed on 01/10/19.  Small amount of residual tumor was visible on post-operative MRI.  Since surgery he has continued to experience numbness and clumsiness of his left leg, requiring cane for ambulation.  He is still pending home physical therapy evaluation.  He has had "small" seizures, consisting of few seconds of shaking of left leg, occurring less than daily.  Continues on Keppra  and Dilantin .    Medications: Current  Outpatient Medications on File Prior to Visit  Medication Sig Dispense Refill   ACCU-CHEK GUIDE TEST test strip CHECK BLOOD SUGAR ONCE IN THE MORNING WHILE FASTING     Accu-Chek Softclix Lancets lancets every morning.     acetaminophen  (TYLENOL ) 500 MG tablet Take 1,000 mg by mouth every 6 (six) hours as needed for moderate pain or headache.     albuterol  (VENTOLIN  HFA) 108 (90 Base) MCG/ACT inhaler Inhale 2 puffs into the lungs every 6 (six) hours as needed for wheezing or shortness of breath. 8 g 2   amLODipine  (NORVASC ) 5 MG tablet TAKE 1 TABLET (5 MG TOTAL) BY MOUTH DAILY. TO LOWER BLOOD PRESSURE. STOP LOSARTAN  90 tablet 0   aspirin  81 MG chewable tablet Chew 81 mg by mouth daily.     atorvastatin  (LIPITOR) 20 MG tablet TAKE 1 TABLET (20 MG TOTAL) BY MOUTH DAILY. 30 tablet 0   clonazePAM  (KLONOPIN ) 0.5 MG tablet TAKE 1 TABLET (0.5 MG TOTAL) BY MOUTH AT BEDTIME. (Patient taking differently: Take 0.5 mg by mouth at bedtime as needed for anxiety.) 60 tablet 2   gabapentin  (NEURONTIN ) 300 MG capsule Take 300 mg by mouth 3 (three) times daily as needed (nerve pain).     Lacosamide  100 MG TABS SMARTSIG:1 Tablet(s) By Mouth Morning-Night     lamoTRIgine  (LAMICTAL ) 200 MG tablet Take 1 tablet (200 mg total) by mouth 2 (two) times daily. 60 tablet 3   lidocaine  (LIDODERM ) 5 % Place 1 patch onto the skin daily. Remove & Discard patch within 12 hours or as directed by MD (Patient taking differently: Place 1 patch onto the skin daily as needed (  pain). Remove & Discard patch within 12 hours or as directed by MD) 14 patch 0   meclizine  (ANTIVERT ) 25 MG tablet Take 1 tablet (25 mg total) by mouth 3 (three) times daily as needed for dizziness. 30 tablet 0   meloxicam  (MOBIC ) 15 MG tablet Take 1 tablet (15 mg total) by mouth daily. 30 tablet 0   metFORMIN  (GLUCOPHAGE ) 500 MG tablet Take 1 tablet (500 mg total) by mouth 2 (two) times daily. 180 tablet 1   methocarbamol  (ROBAXIN ) 750 MG tablet Take 1 tablet (750  mg total) by mouth daily as needed for muscle spasms. 30 tablet 0   omeprazole  (PRILOSEC) 20 MG capsule Take 20 mg by mouth daily.     oxyCODONE  (ROXICODONE ) 5 MG immediate release tablet Take 1 tablet (5 mg total) by mouth every 8 (eight) hours as needed for severe pain (pain score 7-10). Do not take at the same time as Klonopin  30 tablet 0   propranolol  (INDERAL ) 10 MG tablet Take 1 tablet (10 mg total) by mouth 3 (three) times daily as needed (palpitations). 60 tablet 11   tadalafil  (CIALIS ) 5 MG tablet Take 1 tablet (5 mg total) by mouth daily as needed for erectile dysfunction. 1 hour prior to sexual activity 30 tablet 11   tamsulosin  (FLOMAX ) 0.4 MG CAPS capsule Take 1 capsule (0.4 mg total) by mouth daily. 90 capsule 3   VITAMIN A PO Take 1 capsule by mouth daily.     No current facility-administered medications on file prior to visit.    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   Meningioma (HCC)    Meningioma, recurrent of brain (HCC) 01/31/2019   Neuropathy 12/05/2019   Rotator cuff tear    right   Seizures (HCC)    11/27/19 last sz 1 wk ago   Past Surgical History:  Past Surgical History:  Procedure Laterality Date   APPLICATION OF CRANIAL NAVIGATION N/A 01/10/2019   Procedure: APPLICATION OF CRANIAL NAVIGATION;  Surgeon: Manya Sells, MD;  Location: [facility] OR;  Service: Neurosurgery;  Laterality: N/A;   CATARACT EXTRACTION Right    COLONOSCOPY WITH PROPOFOL  N/A 04/10/2023   Procedure: COLONOSCOPY  WITH PROPOFOL ;  Surgeon: Selena Daily, MD;  Location: [facility] ENDOSCOPY;  Service: Gastroenterology;  Laterality: N/A;   CRANIOTOMY Right 11/06/2012   Procedure: CRANIOTOMY TUMOR EXCISION;  Surgeon: Manya Sells, MD;  Location: [facility] NEURO ORS;  Service: Neurosurgery;  Laterality: Right;  Right Parasagittal craniotomy for meningioma with Stealth   CRANIOTOMY Right 01/10/2019   Procedure: Right Parasagittal Craniotomy for Tumor;  Surgeon: Manya Sells, MD;  Location: [facility] OR;  Service: Neurosurgery;  Laterality: Right;  Right parasagittal craniotomy for tumor   ESOPHAGOGASTRODUODENOSCOPY     JOINT REPLACEMENT Right 2018   right hip   PROSTATE BIOPSY  03/2023   REVERSE SHOULDER ARTHROPLASTY Right 01/08/2021   Procedure: RIGHT REVERSE SHOULDER ARTHROPLASTY;  Surgeon: Jasmine Mesi, MD;  Location: [facility] OR;  Service: Orthopedics;  Laterality:  Right;   right knee arthroscopy     SHOULDER ARTHROSCOPY Right 06/09/2020   Procedure: RIGHT SHOULDER ARTHROSCOPY AND DEBRIDEMENT;  Surgeon: Timothy Ford, MD;  Location:  SURGERY CENTER;  Service: Orthopedics;  Laterality: Right;   SHOULDER ARTHROSCOPY WITH SUBACROMIAL DECOMPRESSION, ROTATOR CUFF REPAIR AND BICEP TENDON REPAIR Left 09/12/2023   Procedure: LEFT SHOULDER ARTHROSCOPY, DEBRIDEMENT, MINI OPEN BICEP TENDON REPAIR AND ROTATOR CUFF TEAR REPAIR;  Surgeon: Jasmine Mesi, MD;  Location: [facility] OR;  Service: Orthopedics;  Laterality: Left;   Social History:  Social History   Socioeconomic History   Marital status: Legally Separated    Spouse name: Not on file   Number of children: 2   Years of education: Not on file   Highest education level: Not on file  Occupational History   Not on file  Tobacco Use   Smoking status: Former    Current packs/day: 0.00    Average packs/day: (0.2 ttl pk-yrs)    Types: Cigarettes    Start date: 01/29/2004    Quit date: 01/29/2019    Years since quitting: 4.8    Passive exposure: Past    Smokeless tobacco: Never   Tobacco comments:    weekend smoker  Vaping Use   Vaping status: Some Days   Substances: Nicotine, Flavoring  Substance and Sexual Activity   Alcohol use: Yes    Alcohol/week: 2.0 standard drinks of alcohol    Types: 2 Standard drinks or equivalent per week    Comment: couple drinks on the weekends   Drug use: No   Sexual activity: Not Currently  Other Topics Concern   Not on file  Social History Narrative      Caffeine - soda occass   Social Drivers of Health   Financial Resource Strain: Low Risk  (07/21/2023)   Overall Financial Resource Strain (CARDIA)    Difficulty of Paying Living Expenses: Not hard at all  Food Insecurity: Patient Declined (11/29/2023)   Hunger Vital Sign    Worried About Running Out of Food in the Last Year: Patient declined    Ran Out of Food in the Last Year: Patient declined  Transportation Needs: Patient Declined (11/29/2023)   PRAPARE - Administrator, Civil Service (Medical): Patient declined    Lack of Transportation (Non-Medical): Patient declined  Physical Activity: Not on file  Stress: Not on file  Social Connections: Not on file  Intimate Partner Violence: Patient Declined (11/29/2023)   Humiliation, Afraid, Rape, and Kick questionnaire    Fear of Current or Ex-Partner: Patient declined    Emotionally Abused: Patient declined    Physically Abused: Patient declined    Sexually Abused: Patient declined   Family History:  Family History  Problem Relation Age of Onset   Hypertension Mother    Dementia Mother    Diabetes Mother    Hypertension Father    CAD Father    Diabetes Father    CAD Brother     Review of Systems: Constitutional: Denies fevers, chills or abnormal weight loss Eyes: Denies blurriness of vision Ears, nose, mouth, throat, and face: Denies mucositis or sore throat Respiratory: Denies cough, dyspnea or wheezes Cardiovascular: Denies palpitation, chest discomfort or lower extremity  swelling Gastrointestinal:  Denies nausea, constipation, diarrhea GU: Denies dysuria or incontinence Skin: Denies abnormal skin rashes Neurological: Per HPI Musculoskeletal: Denies joint pain, back or neck discomfort. No decrease in ROM Behavioral/Psych: Denies anxiety, disturbance in thought content, and mood instability  Physical Exam: Vitals:  12/04/23 1133  BP: (!) 144/81  Pulse: 60  Resp: 18  Temp: 98.7 F (37.1 C)  SpO2: 98%    KPS: 80. General: Alert, cooperative, pleasant, in no acute distress Head: Craniotomy scar noted, dry and intact. EENT: No conjunctival injection or scleral icterus. Oral mucosa moist Lungs: Resp effort normal Cardiac: Regular rate and rhythm Abdomen: Soft, non-distended abdomen Skin: No rashes cyanosis or petechiae. Extremities: No clubbing or edema  Neurologic Exam: Mental Status: Awake, alert, attentive to examiner. Oriented to self and environment. Language is fluent with intact comprehension.  Cranial Nerves: Visual acuity is grossly normal. Visual fields are full. Extra-ocular movements intact. No ptosis. Face is symmetric, tongue midline. Motor: Tone and bulk are normal.Power is impaired in left leg, 4+/5 distally. Reflexes are symmetric, no pathologic reflexes present. Impaired heel to shin left leg Sensory: Stocking sensory loss Gait: Cane assisted  Labs: I have reviewed the data as listed    Component Value Date/Time   NA 140 11/10/2023 1852   NA 144 04/15/2020 1515   K 4.0 11/10/2023 1852   CL 105 11/10/2023 1852   CO2 23 11/10/2023 1852   GLUCOSE 96 11/10/2023 1852   BUN 15 11/10/2023 1852   BUN 13 04/15/2020 1515   CREATININE 0.98 11/10/2023 1852   CALCIUM  9.7 11/10/2023 1852   PROT 7.1 03/30/2023 2020   PROT 7.1 12/19/2022 1002   ALBUMIN  4.1 03/30/2023 2020   ALBUMIN  4.7 12/19/2022 1002   AST 29 03/30/2023 2020   ALT 22 03/30/2023 2020   ALKPHOS 74 03/30/2023 2020   BILITOT 0.6 03/30/2023 2020   BILITOT 0.2  12/19/2022 1002   GFRNONAA >60 11/10/2023 1852   GFRAA >60 04/17/2020 1313   Lab Results  Component Value Date   WBC 10.1 11/10/2023   NEUTROABS 5.1 03/30/2023   HGB 15.5 11/10/2023   HCT 45.8 11/10/2023   MCV 90.9 11/10/2023   PLT 287 11/10/2023   Imaging:  [facility] Clinician Interpretation: I have personally reviewed the CNS images as listed.  My interpretation, in the context of the patient's clinical presentation, is stable disease  CLINICAL DATA:  Brain/CNS neoplasm. Assess treatment response. Malignant meningioma of meninges.   EXAM: MRI HEAD WITHOUT AND WITH CONTRAST   TECHNIQUE: Multiplanar, multiecho pulse sequences of the brain and surrounding structures were obtained without and with intravenous contrast.   CONTRAST:  8mL GADAVIST  GADOBUTROL  1 MMOL/ML IV SOLN   COMPARISON:  Head CT 11/13/2022. MRI 06/08/2022. 03/26/2021. 02/11/2020.   FINDINGS: Brain: Diffusion imaging is negative for acute or subacute infarction. No abnormality affects the brainstem or cerebellum. Cerebral hemispheres show chronic abnormal T2 and FLAIR signal within the deep white matter as seen previously. Previous craniotomy at the vertex for tumor resection. Stable volume loss and gliosis at the frontoparietal vertices related to the previous tumor in subsequent resection. 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 No evidence of recurrent focal tumor. The patient has chronic nonspecific dural thickening in the vertex region of both sides, more on the left than the right, which is nonprogressive. No evidence of a focal enhancing lesion to suggest recurrent disease.   Vascular: No other vascular finding.   Skull and upper cervical spine: Craniotomy changes as above.   Sinuses/Orbits: Clear/normal   Other: None   IMPRESSION: 1. No change since prior studies as distant as September  2021. 2. Previous craniotomy at the vertex for tumor  resection. Stable volume loss and gliosis at the frontoparietal vertices related to the previous tumor and subsequent resection. 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 No evidence of recurrent focal tumor. 3. Chronic nonspecific dural thickening in the vertex region of both sides, more on the left than the right, which is nonprogressive. No evidence of a focal enhancing lesion to suggest recurrent disease. 4. Chronic small-vessel ischemic changes of the cerebral hemispheric white matter.   Electronically Signed   By: Bettylou Brunner M.D.   On: 04/03/2023 14:51   Assessment/Plan 1. Meningioma, recurrent of brain (HCC)  2. Seizures [facility])  Mr. Saraceni is clinically stable today.  Seizures are well controlled overall.   Will recommend continuing LMG 200mg  BID.  Vimpat  was discontinued at prior visit.  Klonopin  can continue at 0.5mg  HS as discussed.    He will continue to work with PT for leg weakness, chronic.  We ask that ROCKY GLADDEN return to clinic in 6 months with MRI brain for review, or sooner if needed.    We appreciate the opportunity to participate in the care of CAVIN LONGMAN.    All questions were answered. The patient knows to call the clinic with any problems, questions or concerns. No barriers to learning were detected.    I have spent a total of 30 minutes of face-to-face and non-face-to-face time, excluding clinical staff time, preparing to see patient, ordering tests and/or medications, counseling the patient, and care coordination    Ricka Westra K Marciana Uplinger, MD Medical Director of Neuro-Oncology [facility] at [facility] 12/04/23 11:38 AM

## 2023-12-13 DIAGNOSIS — E7800 Pure hypercholesterolemia, unspecified: Secondary | ICD-10-CM | POA: Diagnosis not present

## 2023-12-13 DIAGNOSIS — R5383 Other fatigue: Secondary | ICD-10-CM | POA: Diagnosis not present

## 2023-12-13 DIAGNOSIS — R7303 Prediabetes: Secondary | ICD-10-CM | POA: Diagnosis not present

## 2023-12-15 ENCOUNTER — Ambulatory Visit (INDEPENDENT_AMBULATORY_CARE_PROVIDER_SITE_OTHER): Admitting: Surgical

## 2023-12-15 DIAGNOSIS — Z9889 Other specified postprocedural states: Secondary | ICD-10-CM

## 2023-12-15 DIAGNOSIS — M19012 Primary osteoarthritis, left shoulder: Secondary | ICD-10-CM | POA: Diagnosis not present

## 2023-12-17 ENCOUNTER — Encounter: Payer: Self-pay | Admitting: Surgical

## 2023-12-17 NOTE — Progress Notes (Signed)
 Follow-up Office Visit Note   Patient: Joshua [name]           Date of Birth: [birth year]           MRN: [id number] Visit Date: 12/15/2023 Requested by: Center, [facility] [address] [place],  [place] [address] PCP: [facility]  Subjective: Chief Complaint  Patient presents with   Left Shoulder - Pain, Follow-up    CT review    HPI: Joshua [name] is a 63 y.o. male who returns to the office for follow-up visit.    Plan at last visit was: We will obtain MRI arthrogram of the left shoulder for further evaluation of potential retear of the rotator cuff following his fall. Follow-up after MRI to review results.   Since then, patient notes he has continued discomfort in the left shoulder which is present basically 24/7.  He has pain localizing primarily to the lateral aspect of the shoulder without significant radicular component.  We have gone over his MRI scan over the phone and ordered CT scan for preoperative planning purposes to pursue reverse shoulder arthroplasty.  MRI demonstrated recurrent tearing of the rotator cuff tear repair with 3 cm retracted tear.  He has history of right shoulder reverse shoulder arthroplasty that is functioning very well for him and he is pleased with how his right shoulder is doing.  He would like to have surgery as soon as possible due to the amount of left shoulder discomfort.  He has recently seen his PCP and he does have upcoming follow-up with a cardiologist.  [place] aspirin  81 mg but no other blood thinners.              ROS: All systems reviewed are negative as they relate to the chief complaint within the history of present illness.  Patient denies fevers or chills.  Assessment & Plan: Visit Diagnoses:  1. S/P left rotator cuff repair   2. Glenohumeral arthritis, left     Plan: Joshua [name] is a 63 y.o. male who returns to the office for follow-up visit.  Plan from last visit was noted above in HPI.  They now return  with CT scan of the left shoulder done for preoperative planning purposes.  Patient wishes to proceed with left shoulder reverse shoulder arthroplasty.  He understands the recovery timeframe and the surgical process from his right shoulder replacement but these were detailed again to refresh Xander's memory.  We discussed the risks and benefits of the procedure.  Patient would like to proceed with surgery.  He will require medical/cardio risk stratification prior to procedure.  Follow-up after surgery.  Follow-Up Instructions: No follow-ups on file.   Orders:  No orders of the defined types were placed in this encounter.  No orders of the defined types were placed in this encounter.     Procedures: No procedures performed   Clinical Data: No additional findings.  Objective: Vital Signs: There were no vitals taken for this visit.  Physical Exam:  Constitutional: Patient appears well-developed HEENT:  Head: Normocephalic Eyes:EOM are normal Neck: Normal range of motion Cardiovascular: Normal rate Pulmonary/chest: Effort normal Neurologic: Patient is alert Skin: Skin is warm Psychiatric: Patient has normal mood and affect  Ortho Exam: Ortho exam demonstrates left shoulder with 50 degrees X rotation, 90 degrees abduction, 130 degrees forward elevation passively and actively.  He has preserved external rotation strength despite the rotator cuff tear with infraspinatus strength rated 5/5.  Deltoid strength rated  5/5 and subscap strength 5/5.  Incisions are well-healed over the anterior aspect of the shoulder.  He does have some return of fluid to the anterior lateral aspect of the shoulder as was noticed on last exam.  Axillary nerve intact with deltoid firing.  Intact EPL, FPL, finger abduction.  Specialty Comments:  Narrative & Impression CLINICAL DATA:  Low back pain and bilateral hip pain with extension down the left leg. Six months duration. Symptoms worsened after a fall 2  months ago.   EXAM: MRI LUMBAR SPINE WITHOUT CONTRAST   TECHNIQUE: Multiplanar, multisequence MR imaging of the lumbar spine was performed. No intravenous contrast was administered.   COMPARISON:  Radiography 01/11/2023   FINDINGS: Segmentation:  5 lumbar type vertebral bodies.   Alignment: Straightening of the normal lumbar lordosis. Scoliotic curvature convex to the right with the apex at L3.   Vertebrae: No fracture in the lumbar region. Abnormal edema visible at the edge of the study at the S3 level consistent with a recent sacral fracture. This was not evaluated completely. Benign appearing hemangioma within the left side of the L4 vertebral body.   Conus medullaris and cauda equina: Conus extends to the L1 level. Conus and cauda equina appear normal.   Paraspinal and other soft tissues: Negative   Disc levels:   T11-12, T12-L1 and L1-2: No disc pathology. Right-sided osteophytes at the L1-2 level do encroach somewhat of upon the extraforaminal region but the significance is doubtful.   L2-3: Prominent right lateral bridging osteophytes. Right foraminal to extraforaminal encroachment could possibly affect the right L2 nerve, but finding is obviously chronic.   L3-4: Similar prominent right-sided osteophytes with encroachment upon the right lateral foraminal to extraforaminal region.   L4-5: Bulging of the discs with lateral osteophytes prominent on the left. Mild facet and ligamentous hypertrophy. Mild narrowing of the lateral recesses but no compression of the nerves within the canal. The left-sided osteophytes could possibly affect the L4 nerve in the extra-spinal region.   L5-S1: Endplate osteophytes and bulging of the disc. Bilateral facet osteoarthritis. No canal or lateral recess stenosis. Bilateral foraminal encroachment could affect either L5 nerve. The facet arthritis could be painful.   IMPRESSION: 1. Abnormal edema at the edge of the study at the S3  level consistent with a recent sacral fracture. This was not evaluated completely. 2. Scoliotic curvature convex to the right with the apex at L3. 3. Right-sided bridging osteophytes at L2-3 and L3-4 could possibly affect the right-sided exiting nerves. Finding is obviously chronic. 4. Left-sided osteophytes at L4-5 could possibly affect the left L4 nerve in the extra-spinal region. This also appears chronic. 5. Bilateral foraminal stenosis due to osteophytes, bulging of the disc and facet arthropathy at L5-S1 that could affect either L5 nerve. The facet arthritis could be painful.  Imaging: No results found.   PMFS History: Patient Active Problem List   Diagnosis Date Noted   Biceps tendonitis on left 09/21/2023   Degenerative superior labral anterior-to-posterior (SLAP) tear of left shoulder 09/21/2023   Synovitis of left shoulder 09/21/2023   History of colonic polyps 04/10/2023   Acute prostatitis 04/01/2023   DM (diabetes mellitus) (HCC) 03/31/2023   Demand ischemia of myocardium (HCC) 03/31/2023   Severe sepsis (HCC) 03/30/2023   Elevated troponin 03/30/2023   Paroxysmal SVT (supraventricular tachycardia) (HCC) 03/30/2023   High anion gap metabolic acidosis 03/30/2023   Hematuria 03/30/2023   S/p prostate biopsy 03/29/23 03/30/2023   Hypotension 03/30/2023   History of recurrent meningioma  s/p resection x2 03/30/2023   Aortic atherosclerosis (HCC) 10/20/2022   Pain in limb 06/14/2022   Pain due to onychomycosis of toenails of both feet 03/04/2021   Arthritis of right shoulder region    S/P reverse total shoulder arthroplasty, 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   Essential hypertension 07/02/2020   Prediabetes 07/02/2020   Polyuria 07/02/2020   Complete tear of left rotator cuff    Impingement syndrome of right shoulder    Neuropathy 12/05/2019   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   Seizures (HCC) 01/31/2019   Meningioma, recurrent of brain (HCC) 01/31/2019    Meningioma (HCC) 01/03/2019   Diffuse idiopathic skeletal hyperostosis 06/05/2013   Past Medical History:  Diagnosis Date   Anxiety    Aortic atherosclerosis (HCC)    Brain tumor (HCC) 02/20/2013   brain tumor removed in March 2014, Dr Derotha Fletcher   Diabetes mellitus without complication (HCC)    Diffuse idiopathic skeletal hyperostosis 06/05/2013   Dizziness    Enlarged prostate    GERD (gastroesophageal reflux disease)    Headache(784.0)    scattered   High cholesterol    Hypertension    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   Meningioma (HCC)    Meningioma, recurrent of brain (HCC) 01/31/2019   Neuropathy 12/05/2019   Rotator cuff tear    right   Seizures (HCC)    11/27/19 last sz 1 wk ago    Family History  Problem Relation Age of Onset   Hypertension Mother    Dementia Mother    Diabetes Mother    Hypertension Father    CAD Father    Diabetes Father    CAD Brother     Past Surgical History:  Procedure Laterality Date   APPLICATION OF CRANIAL NAVIGATION N/A 01/10/2019   Procedure: APPLICATION OF CRANIAL NAVIGATION;  Surgeon: Manya Sells, MD;  Location: [facility] OR;  Service: Neurosurgery;  Laterality: N/A;   CATARACT EXTRACTION Right    COLONOSCOPY WITH PROPOFOL  N/A 04/10/2023   Procedure: COLONOSCOPY WITH PROPOFOL ;  Surgeon: Selena Daily, MD;  Location: [facility] ENDOSCOPY;  Service: Gastroenterology;  Laterality: N/A;   CRANIOTOMY Right 11/06/2012   Procedure: CRANIOTOMY TUMOR EXCISION;  Surgeon: Manya Sells, MD;  Location: [facility] NEURO ORS;  Service: Neurosurgery;  Laterality: Right;  Right Parasagittal craniotomy for meningioma with Stealth   CRANIOTOMY Right 01/10/2019   Procedure: Right Parasagittal Craniotomy for Tumor;  Surgeon: Manya Sells, MD;  Location[facility] OR;  Service: Neurosurgery;  Laterality: Right;  Right parasagittal craniotomy for tumor   ESOPHAGOGASTRODUODENOSCOPY     JOINT REPLACEMENT Right 2018   right hip   PROSTATE BIOPSY   03/2023   REVERSE SHOULDER ARTHROPLASTY Right 01/08/2021   Procedure: RIGHT REVERSE SHOULDER ARTHROPLASTY;  Surgeon: Jasmine Mesi, MD;  Location: [facility] OR;  Service: Orthopedics;  Laterality: Right;   right knee arthroscopy     SHOULDER ARTHROSCOPY Right 06/09/2020   Procedure: RIGHT SHOULDER ARTHROSCOPY AND DEBRIDEMENT;  Surgeon: Timothy Ford, MD;  Location: [facility];  Service: Orthopedics;  Laterality: Right;   SHOULDER ARTHROSCOPY WITH SUBACROMIAL DECOMPRESSION, ROTATOR CUFF REPAIR AND BICEP TENDON REPAIR Left 09/12/2023   Procedure: LEFT SHOULDER ARTHROSCOPY, DEBRIDEMENT, MINI OPEN BICEP TENDON REPAIR AND ROTATOR CUFF TEAR REPAIR;  Surgeon: Jasmine Mesi, MD;  Location: [facility] OR;  Service: Orthopedics;  Laterality: Left;   Social History   Occupational History   Not on file  Tobacco Use   Smoking status: Former    Current  packs/day: 0.00    Average packs/day: (0.2 ttl pk-yrs)    Types: Cigarettes    Start date: 01/29/2004    Quit date: 01/29/2019    Years since quitting: 4.8    Passive exposure: Past   Smokeless tobacco: Never   Tobacco comments:    weekend smoker  Vaping Use   Vaping status: Some Days   Substances: Nicotine, Flavoring  Substance and Sexual Activity   Alcohol use: Yes    Alcohol/week: 2.0 standard drinks of alcohol    Types: 2 Standard drinks or equivalent per week    Comment: couple drinks on the weekends   Drug use: No   Sexual activity: Not Currently

## 2023-12-27 DIAGNOSIS — Z7689 Persons encountering health services in other specified circumstances: Secondary | ICD-10-CM | POA: Diagnosis not present

## 2023-12-27 DIAGNOSIS — E119 Type 2 diabetes mellitus without complications: Secondary | ICD-10-CM | POA: Diagnosis not present

## 2023-12-27 DIAGNOSIS — R569 Unspecified convulsions: Secondary | ICD-10-CM | POA: Diagnosis not present

## 2023-12-27 DIAGNOSIS — I10 Essential (primary) hypertension: Secondary | ICD-10-CM | POA: Diagnosis not present

## 2024-01-10 DIAGNOSIS — I10 Essential (primary) hypertension: Secondary | ICD-10-CM | POA: Diagnosis not present

## 2024-01-10 DIAGNOSIS — Z23 Encounter for immunization: Secondary | ICD-10-CM | POA: Diagnosis not present

## 2024-01-10 DIAGNOSIS — E7800 Pure hypercholesterolemia, unspecified: Secondary | ICD-10-CM | POA: Diagnosis not present

## 2024-01-10 DIAGNOSIS — R7303 Prediabetes: Secondary | ICD-10-CM | POA: Diagnosis not present

## 2024-01-23 DIAGNOSIS — R002 Palpitations: Secondary | ICD-10-CM | POA: Diagnosis not present

## 2024-01-23 DIAGNOSIS — I700 Atherosclerosis of aorta: Secondary | ICD-10-CM | POA: Diagnosis not present

## 2024-01-23 DIAGNOSIS — Z720 Tobacco use: Secondary | ICD-10-CM | POA: Diagnosis not present

## 2024-01-23 DIAGNOSIS — R7989 Other specified abnormal findings of blood chemistry: Secondary | ICD-10-CM | POA: Diagnosis not present

## 2024-01-23 DIAGNOSIS — Z8249 Family history of ischemic heart disease and other diseases of the circulatory system: Secondary | ICD-10-CM | POA: Diagnosis not present

## 2024-01-23 DIAGNOSIS — I10 Essential (primary) hypertension: Secondary | ICD-10-CM | POA: Diagnosis not present

## 2024-01-23 DIAGNOSIS — I2089 Other forms of angina pectoris: Secondary | ICD-10-CM | POA: Diagnosis not present

## 2024-02-01 ENCOUNTER — Other Ambulatory Visit: Payer: Self-pay | Admitting: *Deleted

## 2024-02-01 DIAGNOSIS — D329 Benign neoplasm of meninges, unspecified: Secondary | ICD-10-CM

## 2024-02-01 MED ORDER — CLONAZEPAM 0.5 MG PO TABS: 0.5000 mg | ORAL_TABLET | Freq: Every day | ORAL | 2 refills | Status: AC

## 2024-02-05 ENCOUNTER — Telehealth: Payer: Self-pay | Admitting: Surgical

## 2024-02-05 NOTE — Telephone Encounter (Signed)
 Patient called and said that he has so much pain in his left shoulder. He stated that whatever he put in seems like its coming out of the shoulder. He did have surgery a 4 months ago. CB#[phone]

## 2024-02-06 DIAGNOSIS — M25552 Pain in left hip: Secondary | ICD-10-CM | POA: Diagnosis not present

## 2024-02-06 DIAGNOSIS — G8929 Other chronic pain: Secondary | ICD-10-CM | POA: Diagnosis not present

## 2024-02-09 ENCOUNTER — Telehealth: Payer: Self-pay | Admitting: Orthopedic Surgery

## 2024-02-09 NOTE — Telephone Encounter (Signed)
 Patient would like to have left reverse shoulder arthroplasty ASAP.   A medical clearance request was faxed with confirmation to PCP Joshua [name].  A cardiac clearance request was also faxed to [facility].

## 2024-02-19 NOTE — Progress Notes (Signed)
 The patient attended a screening event on 11/29/2023 where his BP screening results was 140/82. At the event the patient noted he has [employer] and does not smoke. Patient declined having any SDOH insecurities. Pt listed pcp as [facility]. Per chart review pt has a pcp and the last office visit was 07/30/2023 for essential hypertension. Pt is being seen by Dr. Mliss DELENA Minors, FNP at [facility]. On 02/06/2024 pt was seen for chronic left hip pain at [facility]. The pt BP was 110/76 on 08/15/23. According to chart pt is on a strict diet control and becoming more physically active. Chart review also indicates a future appt on 12/14/23. No additional Health equity team support indicated at this time.

## 2024-02-22 ENCOUNTER — Ambulatory Visit: Admitting: Surgical

## 2024-02-23 DIAGNOSIS — R931 Abnormal findings on diagnostic imaging of heart and coronary circulation: Secondary | ICD-10-CM | POA: Diagnosis not present

## 2024-02-23 DIAGNOSIS — R9430 Abnormal result of cardiovascular function study, unspecified: Secondary | ICD-10-CM | POA: Diagnosis not present

## 2024-02-23 DIAGNOSIS — R079 Chest pain, unspecified: Secondary | ICD-10-CM | POA: Diagnosis not present

## 2024-02-28 DIAGNOSIS — R002 Palpitations: Secondary | ICD-10-CM | POA: Diagnosis not present

## 2024-02-28 DIAGNOSIS — M25552 Pain in left hip: Secondary | ICD-10-CM | POA: Diagnosis not present

## 2024-02-28 DIAGNOSIS — Z96642 Presence of left artificial hip joint: Secondary | ICD-10-CM | POA: Diagnosis not present

## 2024-02-28 DIAGNOSIS — M1612 Unilateral primary osteoarthritis, left hip: Secondary | ICD-10-CM | POA: Diagnosis not present

## 2024-04-08 ENCOUNTER — Other Ambulatory Visit: Payer: Self-pay | Admitting: Internal Medicine

## 2024-04-08 DIAGNOSIS — R569 Unspecified convulsions: Secondary | ICD-10-CM

## 2024-04-08 DIAGNOSIS — D329 Benign neoplasm of meninges, unspecified: Secondary | ICD-10-CM

## 2024-04-11 ENCOUNTER — Encounter (HOSPITAL_BASED_OUTPATIENT_CLINIC_OR_DEPARTMENT_OTHER): Payer: Self-pay | Admitting: *Deleted

## 2024-04-11 ENCOUNTER — Other Ambulatory Visit: Payer: Self-pay

## 2024-04-11 ENCOUNTER — Emergency Department (HOSPITAL_BASED_OUTPATIENT_CLINIC_OR_DEPARTMENT_OTHER)
Admission: EM | Admit: 2024-04-11 | Discharge: 2024-04-11 | Disposition: A | Discharge status: Home / Self Care | Source: Ambulatory Visit | Attending: Emergency Medicine | Admitting: Emergency Medicine

## 2024-04-11 ENCOUNTER — Emergency Department (HOSPITAL_BASED_OUTPATIENT_CLINIC_OR_DEPARTMENT_OTHER): Admitting: Radiology

## 2024-04-11 DIAGNOSIS — R0789 Other chest pain: Secondary | ICD-10-CM | POA: Diagnosis not present

## 2024-04-11 DIAGNOSIS — I2510 Atherosclerotic heart disease of native coronary artery without angina pectoris: Secondary | ICD-10-CM | POA: Insufficient documentation

## 2024-04-11 DIAGNOSIS — R079 Chest pain, unspecified: Secondary | ICD-10-CM

## 2024-04-11 DIAGNOSIS — Z7982 Long term (current) use of aspirin: Secondary | ICD-10-CM | POA: Diagnosis not present

## 2024-04-11 DIAGNOSIS — M545 Low back pain, unspecified: Secondary | ICD-10-CM | POA: Insufficient documentation

## 2024-04-11 DIAGNOSIS — Z96611 Presence of right artificial shoulder joint: Secondary | ICD-10-CM | POA: Diagnosis not present

## 2024-04-11 DIAGNOSIS — R0602 Shortness of breath: Secondary | ICD-10-CM | POA: Diagnosis not present

## 2024-04-11 LAB — CBC
HCT: 46.5 % (ref 39.0–52.0)
Hemoglobin: 15.9 g/dL (ref 13.0–17.0)
MCH: 31.6 pg (ref 26.0–34.0)
MCHC: 34.2 g/dL (ref 30.0–36.0)
MCV: 92.4 fL (ref 80.0–100.0)
Platelets: 217 K/uL (ref 150–400)
RBC: 5.03 MIL/uL (ref 4.22–5.81)
RDW: 12.3 % (ref 11.5–15.5)
WBC: 6.4 K/uL (ref 4.0–10.5)
nRBC: 0 % (ref 0.0–0.2)

## 2024-04-11 LAB — BASIC METABOLIC PANEL WITH GFR
Anion gap: 13 (ref 5–15)
BUN: 13 mg/dL (ref 8–23)
CO2: 25 mmol/L (ref 22–32)
Calcium: 10 mg/dL (ref 8.9–10.3)
Chloride: 103 mmol/L (ref 98–111)
Creatinine, Ser: 0.9 mg/dL (ref 0.61–1.24)
GFR, Estimated: >60 mL/min (ref >60)
Glucose, Bld: 115 mg/dL — ABNORMAL HIGH (ref 70–99)
Potassium: 4 mmol/L (ref 3.5–5.1)
Sodium: 140 mmol/L (ref 135–145)

## 2024-04-11 LAB — TROPONIN T, HIGH SENSITIVITY
Troponin T High Sensitivity: <15 ng/L (ref 0–19)
Troponin T High Sensitivity: <15 ng/L (ref 0–19)

## 2024-04-11 NOTE — ED Provider Notes (Signed)
 [facility] AT [facility] Provider Note   CSN: [id number] Arrival date & time: 04/11/24  1258  Patient presents with: Chest Pain  Joshua [name] is a 63 y.o. male with a history of malignant meningioma s/p resection in 2014,2020 c/b seizures, aortic atherosclerosis, early CAD, and stroke who presents with chest pain for the past 2 days.  Patient states that yesterday he started having dull lower back pain as well as chest tightness that would present intermittently.  The patient states that he was just sitting at home and denies any recent trauma to his chest or back.  Also denies new exercise regimens.  Cannot point to any specific factor that might of caused his chest pain.  Does note a significant family history of MI in his brother and father who with passed away.  He was just concerned given family history and so he wanted to be evaluated for chest pain.  Also notes that he has some anxiety and thinks this may be contributing to his symptoms as well.  He describes his chest pain as feeling like a tightness after lifting weights but notes that he is not lifted weights recently.  Denies nausea, vomiting, diarrhea, abdominal pain, shortness of breath, cough, or any other symptoms at this time.   Prior to Admission medications   Medication Sig Start Date End Date Taking? Authorizing Provider  ACCU-CHEK GUIDE TEST test strip CHECK BLOOD SUGAR ONCE IN THE MORNING WHILE FASTING 06/30/23   [provider]  Accu-Chek Softclix Lancets lancets every morning. 06/30/23   [provider]  acetaminophen  (TYLENOL ) 500 MG tablet Take 1,000 mg by mouth every 6 (six) hours as needed for moderate pain or headache.    [provider]  albuterol  (VENTOLIN  HFA) 108 (90 Base) MCG/ACT inhaler Inhale 2 puffs into the lungs every 6 (six) hours as needed for wheezing or shortness of breath. 08/13/21   Delbert Clam, MD  amLODipine  (NORVASC ) 5 MG tablet TAKE 1  TABLET (5 MG TOTAL) BY MOUTH DAILY. TO LOWER BLOOD PRESSURE. STOP LOSARTAN  08/13/21   Delbert Clam, MD  aspirin  81 MG chewable tablet Chew 81 mg by mouth daily.    [provider]  atorvastatin  (LIPITOR) 20 MG tablet TAKE 1 TABLET (20 MG TOTAL) BY MOUTH DAILY. 12/08/21   Newlin, Enobong, MD  clonazePAM  (KLONOPIN ) 0.5 MG tablet Take 1 tablet (0.5 mg total) by mouth at bedtime. 02/01/24   Vaslow, Zachary K, MD  gabapentin  (NEURONTIN ) 300 MG capsule Take 300 mg by mouth 3 (three) times daily as needed (nerve pain). 07/30/23   [provider]  lamoTRIgine  (LAMICTAL ) 200 MG tablet TAKE 1 TABLET (200 MG TOTAL) BY MOUTH 2 (TWO) TIMES DAILY. 04/08/24   Vaslow, Zachary K, MD  lidocaine  (LIDODERM ) 5 % Place 1 patch onto the skin daily. Remove & Discard patch within 12 hours or as directed by MD Patient taking differently: Place 1 patch onto the skin daily as needed (pain). Remove & Discard patch within 12 hours or as directed by MD 04/07/22   Hildegard Loge, PA-C  meclizine  (ANTIVERT ) 25 MG tablet Take 1 tablet (25 mg total) by mouth 3 (three) times daily as needed for dizziness. 01/04/22   Horton, Roxie HERO, DO  meloxicam  (MOBIC ) 15 MG tablet Take 1 tablet (15 mg total) by mouth daily. 09/12/23 09/11/24  Magnant, Carlin CROME, PA-C  metFORMIN  (GLUCOPHAGE ) 500 MG tablet Take 1 tablet (500 mg total) by mouth 2 (two) times daily. 08/13/21  Newlin, Enobong, MD  methocarbamol  (ROBAXIN ) 750 MG tablet Take 1 tablet (750 mg total) by mouth daily as needed for muscle spasms. 10/06/23   Magnant, Carlin CROME, PA-C  omeprazole  (PRILOSEC) 20 MG capsule Take 20 mg by mouth daily. 08/29/22   [provider]  oxyCODONE  (ROXICODONE ) 5 MG immediate release tablet Take 1 tablet (5 mg total) by mouth every 8 (eight) hours as needed for severe pain (pain score 7-10). Do not take at the same time as Klonopin  10/06/23   Magnant, Carlin CROME, PA-C  propranolol  (INDERAL ) 10 MG tablet Take 1 tablet (10 mg total) by mouth 3  (three) times daily as needed (palpitations). 05/31/22   Swinyer, Rosaline HERO, NP  tadalafil  (CIALIS ) 5 MG tablet Take 1 tablet (5 mg total) by mouth daily as needed for erectile dysfunction. 1 hour prior to sexual activity 01/17/22   Francisca Redell BROCKS, MD  tamsulosin  (FLOMAX ) 0.4 MG CAPS capsule Take 1 capsule (0.4 mg total) by mouth daily. 10/18/23   Francisca Redell BROCKS, MD  VITAMIN A PO Take 1 capsule by mouth daily.    [provider]    Allergies: Patient has no known allergies.    Review of Systems  Constitutional:  Negative for chills and fever.  Respiratory:  Positive for chest tightness. Negative for cough and shortness of breath.   Cardiovascular:  Positive for chest pain. Negative for palpitations and leg swelling.  Gastrointestinal:  Negative for abdominal distention, abdominal pain, constipation, diarrhea, nausea and vomiting.  Endocrine: Negative for polyuria.  Genitourinary:  Negative for decreased urine volume, difficulty urinating and dysuria.  Musculoskeletal:  Positive for back pain.  Psychiatric/Behavioral:  The patient is nervous/anxious.     Updated Vital Signs BP 128/74   Pulse 72   Temp 98 F (36.7 C) (Oral)   Resp 19   SpO2 96%   Physical Exam Vitals reviewed.  Constitutional:      General: He is not in acute distress.    Appearance: Normal appearance. He is normal weight. He is not ill-appearing, toxic-appearing or diaphoretic.  HENT:     Head: Normocephalic and atraumatic.     Mouth/Throat:     Mouth: Mucous membranes are moist.     Pharynx: Oropharynx is clear. No oropharyngeal exudate or posterior oropharyngeal erythema.  Eyes:     General: No scleral icterus.       Right eye: No discharge.        Left eye: No discharge.     Extraocular Movements: Extraocular movements intact.     Conjunctiva/sclera: Conjunctivae normal.     Pupils: Pupils are equal, round, and reactive to light.  Cardiovascular:     Rate and Rhythm: Normal rate and regular  rhythm.     Pulses: Normal pulses.     Heart sounds: Normal heart sounds. No murmur heard.    No friction rub. No gallop.  Pulmonary:     Effort: Pulmonary effort is normal. No tachypnea, accessory muscle usage or respiratory distress.     Breath sounds: No stridor. No decreased breath sounds, wheezing, rhonchi or rales.     Comments: Upper airway sounds in bilateral posterior lung fields Chest:     Chest wall: No mass, deformity, tenderness or crepitus.  Abdominal:     General: Abdomen is flat. There is no distension.     Palpations: Abdomen is soft. There is no mass.     Tenderness: There is no abdominal tenderness. There is no right CVA tenderness, left  CVA tenderness, guarding or rebound.     Hernia: No hernia is present.  Musculoskeletal:        General: No swelling, tenderness, deformity or signs of injury.     Right lower leg: No edema.     Left lower leg: No edema.  Skin:    General: Skin is warm and dry.     Capillary Refill: Capillary refill takes less than 2 seconds.     Coloration: Skin is not jaundiced or pale.     Findings: No bruising, erythema, lesion or rash.  Neurological:     General: No focal deficit present.     Mental Status: He is alert and oriented to person, place, and time. Mental status is at baseline.     Motor: No weakness.     Gait: Gait normal.  Psychiatric:        Mood and Affect: Mood normal.        Behavior: Behavior normal.     (all labs ordered are listed, but only abnormal results are displayed) Labs Reviewed  BASIC METABOLIC PANEL WITH GFR - Abnormal; Notable for the following components:      Result Value   Glucose, Bld 115 (*)    All other components within normal limits  CBC  TROPONIN T, HIGH SENSITIVITY  TROPONIN T, HIGH SENSITIVITY    EKG: Sinus rhythm with RBBB  Radiology: DG Chest 2 View Result Date: 04/11/2024 CLINICAL DATA:  Chest pain.  Shortness of breath. EXAM: CHEST - 2 VIEW COMPARISON:  07/09/2023. FINDINGS:  Bilateral lung fields are clear. Bilateral costophrenic angles are clear. Normal cardio-mediastinal silhouette. No acute osseous abnormalities. Right reverse shoulder arthroplasty noted. The soft tissues are within normal limits. IMPRESSION: No active cardiopulmonary disease. Electronically Signed   By: Ree Molt M.D.   On: 04/11/2024 13:55   Medications Ordered in the ED - No data to display  Medical Decision Making Patient presenting with history of cardiovascular risk factors (stroke, aortic atherosclerosis) and significant family history of MI now with chest pain x 2 days. EKG showing NSR with RBBB, but no ST or T wave abnormalities.  Initial troponin less than 15, repeat is pending.  CXR without acute abnormality.  Slightly hypertensive, otherwise vitals within normal limits.  CBC and BMP unremarkable.  Patient's symptoms are likely in the setting of anxiety versus MSK-related chest pain.  No concern for ACS given lack of EKG findings and troponins.  Will await further troponin and if negative, discharge is medically appropriate.  7:00 PM Reexamined patient who is still well-appearing, symptoms seem to have improved.  Repeat troponin also negative.  Patient is medically ready for discharge.  Educated the patient on strict return precautions.  Final diagnoses:  Chest pain, unspecified type    ED Discharge Orders     None         Waymond Cart, MD 04/11/24 1906    Mannie Pac T, DO 04/11/24 2258

## 2024-04-11 NOTE — ED Notes (Signed)
 Pt d/c instructions, medications, and follow-up care reviewed with pt. Pt verbalized understanding and had no further questions at time of d/c. Pt CA&Ox4, ambulatory, and in NAD at time of d/c

## 2024-04-11 NOTE — ED Triage Notes (Signed)
 PT to ED reporting left sided back pain starting yesterday and worsening into left sided chest pain, shortness of breath and lightheadedness. Hx of Aflutter reported 2 weeks ago.

## 2024-04-11 NOTE — Discharge Instructions (Addendum)
 Your chest pain was not concerning for a heart attack. Your EKG and cardiac biomarkers (troponin) were negative.  There was also no concerning findings on your chest x-ray.  Please return to the emergency department if you experience worsening chest pain with chest pressure, shortness of breath, sweating, nausea and vomiting.

## 2024-05-15 ENCOUNTER — Emergency Department (HOSPITAL_BASED_OUTPATIENT_CLINIC_OR_DEPARTMENT_OTHER)
Admission: EM | Admit: 2024-05-15 | Discharge: 2024-05-15 | Disposition: A | Discharge status: Home / Self Care | Attending: Emergency Medicine | Admitting: Emergency Medicine

## 2024-05-15 ENCOUNTER — Emergency Department (HOSPITAL_BASED_OUTPATIENT_CLINIC_OR_DEPARTMENT_OTHER): Admitting: Radiology

## 2024-05-15 ENCOUNTER — Other Ambulatory Visit (HOSPITAL_BASED_OUTPATIENT_CLINIC_OR_DEPARTMENT_OTHER): Payer: Self-pay

## 2024-05-15 ENCOUNTER — Other Ambulatory Visit: Payer: Self-pay

## 2024-05-15 ENCOUNTER — Encounter (HOSPITAL_BASED_OUTPATIENT_CLINIC_OR_DEPARTMENT_OTHER): Payer: Self-pay | Admitting: Emergency Medicine

## 2024-05-15 DIAGNOSIS — Z79899 Other long term (current) drug therapy: Secondary | ICD-10-CM | POA: Insufficient documentation

## 2024-05-15 DIAGNOSIS — R0781 Pleurodynia: Secondary | ICD-10-CM | POA: Diagnosis not present

## 2024-05-15 DIAGNOSIS — Z7982 Long term (current) use of aspirin: Secondary | ICD-10-CM | POA: Insufficient documentation

## 2024-05-15 DIAGNOSIS — S20212A Contusion of left front wall of thorax, initial encounter: Secondary | ICD-10-CM | POA: Diagnosis not present

## 2024-05-15 DIAGNOSIS — W182XXA Fall in (into) shower or empty bathtub, initial encounter: Secondary | ICD-10-CM | POA: Diagnosis not present

## 2024-05-15 DIAGNOSIS — W19XXXA Unspecified fall, initial encounter: Secondary | ICD-10-CM

## 2024-05-15 DIAGNOSIS — Z043 Encounter for examination and observation following other accident: Secondary | ICD-10-CM | POA: Diagnosis not present

## 2024-05-15 DIAGNOSIS — S298XXA Other specified injuries of thorax, initial encounter: Secondary | ICD-10-CM

## 2024-05-15 DIAGNOSIS — R918 Other nonspecific abnormal finding of lung field: Secondary | ICD-10-CM | POA: Diagnosis not present

## 2024-05-15 MED ORDER — OXYCODONE-ACETAMINOPHEN 5-325 MG PO TABS
1.0000 | ORAL_TABLET | Freq: Four times a day (QID) | ORAL | 0 refills | Status: AC | PRN
  Filled 2024-05-15: qty 10, 3d supply, fill #0

## 2024-05-15 MED ORDER — OXYCODONE-ACETAMINOPHEN 5-325 MG PO TABS
2.0000 | ORAL_TABLET | Freq: Once | ORAL | Status: AC
  Administered 2024-05-15: 2 via ORAL
  Filled 2024-05-15: qty 2

## 2024-05-15 NOTE — ED Provider Notes (Signed)
 [place] EMERGENCY DEPARTMENT AT [facility] Provider Note   CSN: [id number] Arrival date & time: 05/15/24  9063     Patient presents with: Joshua [name] is a 63 y.o. male.   Patient presents with left lower rib pain since falling in the shower.  Pain has been constant since.  Hurts with taking breath.  No fevers or cough.  Patient fractured rib in the past.  No abdominal or other injuries.   Fall This is a new problem. Pertinent negatives include no chest pain, no abdominal pain, no headaches and no shortness of breath.       Prior to Admission medications   Medication Sig Start Date End Date Taking? Authorizing Provider  oxyCODONE -acetaminophen  (PERCOCET/ROXICET) 5-325 MG tablet Take 1 tablet by mouth every 6 (six) hours as needed for severe pain (pain score 7-10). 05/15/24  Yes Tonia Chew, MD  ACCU-CHEK GUIDE TEST test strip CHECK BLOOD SUGAR ONCE IN THE MORNING WHILE FASTING 06/30/23   [provider]  Accu-Chek Softclix Lancets lancets every morning. 06/30/23   [provider]  acetaminophen  (TYLENOL ) 500 MG tablet Take 1,000 mg by mouth every 6 (six) hours as needed for moderate pain or headache.    [provider]  albuterol  (VENTOLIN  HFA) 108 (90 Base) MCG/ACT inhaler Inhale 2 puffs into the lungs every 6 (six) hours as needed for wheezing or shortness of breath. 08/13/21   Delbert Clam, MD  amLODipine  (NORVASC ) 5 MG tablet TAKE 1 TABLET (5 MG TOTAL) BY MOUTH DAILY. TO LOWER BLOOD PRESSURE. STOP LOSARTAN  08/13/21   Delbert Clam, MD  aspirin  81 MG chewable tablet Chew 81 mg by mouth daily.    [provider]  atorvastatin  (LIPITOR) 20 MG tablet TAKE 1 TABLET (20 MG TOTAL) BY MOUTH DAILY. 12/08/21   Newlin, Enobong, MD  clonazePAM  (KLONOPIN ) 0.5 MG tablet Take 1 tablet (0.5 mg total) by mouth at bedtime. 02/01/24   Vaslow, Zachary K, MD  gabapentin  (NEURONTIN ) 300 MG capsule Take 300 mg by mouth 3 (three) times  daily as needed (nerve pain). 07/30/23   [provider]  lamoTRIgine  (LAMICTAL ) 200 MG tablet TAKE 1 TABLET (200 MG TOTAL) BY MOUTH 2 (TWO) TIMES DAILY. 04/08/24   Vaslow, Zachary K, MD  lidocaine  (LIDODERM ) 5 % Place 1 patch onto the skin daily. Remove & Discard patch within 12 hours or as directed by MD Patient taking differently: Place 1 patch onto the skin daily as needed (pain). Remove & Discard patch within 12 hours or as directed by MD 04/07/22   Hildegard Loge, PA-C  meclizine  (ANTIVERT ) 25 MG tablet Take 1 tablet (25 mg total) by mouth 3 (three) times daily as needed for dizziness. 01/04/22   Horton, Roxie HERO, DO  meloxicam  (MOBIC ) 15 MG tablet Take 1 tablet (15 mg total) by mouth daily. 09/12/23 09/11/24  Magnant, Carlin CROME, PA-C  metFORMIN  (GLUCOPHAGE ) 500 MG tablet Take 1 tablet (500 mg total) by mouth 2 (two) times daily. 08/13/21   Newlin, Enobong, MD  methocarbamol  (ROBAXIN ) 750 MG tablet Take 1 tablet (750 mg total) by mouth daily as needed for muscle spasms. 10/06/23   Magnant, Charles L, PA-C  omeprazole  (PRILOSEC) 20 MG capsule Take 20 mg by mouth daily. 08/29/22   [provider]  oxyCODONE  (ROXICODONE ) 5 MG immediate release tablet Take 1 tablet (5 mg total) by mouth every 8 (eight) hours as needed for severe pain (pain score 7-10). Do not take at the same time  as Klonopin  10/06/23   Magnant, Charles L, PA-C  propranolol  (INDERAL ) 10 MG tablet Take 1 tablet (10 mg total) by mouth 3 (three) times daily as needed (palpitations). 05/31/22   Swinyer, Rosaline HERO, NP  tadalafil  (CIALIS ) 5 MG tablet Take 1 tablet (5 mg total) by mouth daily as needed for erectile dysfunction. 1 hour prior to sexual activity 01/17/22   Francisca Redell BROCKS, MD  tamsulosin  (FLOMAX ) 0.4 MG CAPS capsule Take 1 capsule (0.4 mg total) by mouth daily. 10/18/23   Francisca Redell BROCKS, MD  VITAMIN A PO Take 1 capsule by mouth daily.    [provider]    Allergies: Patient has no known allergies.    Review  of Systems  Constitutional:  Negative for chills and fever.  HENT:  Negative for congestion.   Eyes:  Negative for visual disturbance.  Respiratory:  Negative for shortness of breath.   Cardiovascular:  Negative for chest pain.  Gastrointestinal:  Negative for abdominal pain and vomiting.  Genitourinary:  Positive for flank pain. Negative for dysuria.  Musculoskeletal:  Negative for back pain, neck pain and neck stiffness.  Skin:  Negative for rash.  Neurological:  Negative for light-headedness and headaches.    Updated Vital Signs BP (!) 146/83 (BP Location: Left Arm)   Pulse 70   Temp 98.1 F (36.7 C) (Oral)   Resp 16   Wt 79.4 kg   SpO2 98%   BMI 25.84 kg/m   Physical Exam Vitals and nursing note reviewed.  Constitutional:      General: He is not in acute distress.    Appearance: He is well-developed.  HENT:     Head: Normocephalic and atraumatic.     Mouth/Throat:     Mouth: Mucous membranes are moist.  Eyes:     General:        Right eye: No discharge.        Left eye: No discharge.     Conjunctiva/sclera: Conjunctivae normal.  Neck:     Trachea: No tracheal deviation.  Cardiovascular:     Rate and Rhythm: Normal rate and regular rhythm.  Pulmonary:     Effort: Pulmonary effort is normal.     Breath sounds: Normal breath sounds.  Abdominal:     General: There is no distension.     Palpations: Abdomen is soft.     Tenderness: There is no abdominal tenderness. There is no guarding.  Musculoskeletal:        General: Tenderness (left posterior lower ribs) present.     Cervical back: Normal range of motion and neck supple. No rigidity.  Skin:    General: Skin is warm.     Capillary Refill: Capillary refill takes less than 2 seconds.     Findings: No rash.  Neurological:     General: No focal deficit present.     Mental Status: He is alert.     Cranial Nerves: No cranial nerve deficit.  Psychiatric:        Mood and Affect: Mood normal.     (all labs  ordered are listed, but only abnormal results are displayed) Labs Reviewed - No data to display  EKG: None  Radiology: DG Ribs Unilateral W/Chest Left Result Date: 05/15/2024 CLINICAL DATA:  Fall in shower this morning with left-sided rib pain. EXAM: LEFT RIBS AND CHEST - 3+ VIEW COMPARISON:  04/11/2024 FINDINGS: Lungs are adequately inflated and demonstrate mild linear density over the left base likely atelectasis. No evidence of  effusion or pneumothorax. Right lung is clear. Cardiomediastinal silhouette is normal. No definite left-sided rib fracture. Remainder of the exam is unchanged. IMPRESSION: 1. No definite left-sided rib fracture. 2. Mild linear density left base likely atelectasis. Electronically Signed   By: Toribio Agreste M.D.   On: 05/15/2024 12:08     Procedures   Medications Ordered in the ED  oxyCODONE -acetaminophen  (PERCOCET/ROXICET) 5-325 MG per tablet 2 tablet (2 tablets Oral Given 05/15/24 1041)                                    Medical Decision Making Amount and/or Complexity of Data Reviewed Radiology: ordered.  Risk Prescription drug management.   Patient presents with isolated left flank/rib pain clinical concern for occult fracture versus contusion.  No abdominal tenderness or vomiting to suggest splenic rupture.  Plan for chest x-ray to look for obvious fracture or/effusion/pneumonia, spirometer and pain meds.  Close outpatient follow-up discussed patient comfortable plan.  Patient normal oxygenation normal work of breathing.  X-ray independently reviewed no obvious fracture.  Plan to treat with pain meds as needed, spirometer and outpatient follow-up.  No pneumothorax on x-ray.  Patient stable for discharge.     Final diagnoses:  Rib contusion, left, initial encounter  Fall, initial encounter    ED Discharge Orders          Ordered    oxyCODONE -acetaminophen  (PERCOCET/ROXICET) 5-325 MG tablet  Every 6 hours PRN        05/15/24 1033                Tonia Chew, MD 05/15/24 1225

## 2024-05-15 NOTE — ED Triage Notes (Signed)
 Pt bib wheelchair, endorses mechanical fall in shower this morning. Pt c/o LT side rib pain. Pt denies loc or blood thinners

## 2024-05-15 NOTE — Discharge Instructions (Addendum)
 There is no definitive rib fracture on your x-ray however small rib fractures can be missed so we will treat you similarly. Use Tylenol  every 4 hours and motrin  every 6 hrs as needed/ tolerated and ice as needed for pain. For severe pain take oxycodon however realize they have the potential for addiction and it can make you sleepy and has tylenol  in it.  No operating machinery while taking. Return for breathing difficulty, fevers or new concerns. Use spirometer every few hours while awake to help decrease chance of pneumonia.

## 2024-05-16 ENCOUNTER — Telehealth: Payer: Self-pay | Admitting: *Deleted

## 2024-05-16 ENCOUNTER — Ambulatory Visit (HOSPITAL_COMMUNITY): Admission: RE | Admit: 2024-05-16 | Source: Ambulatory Visit

## 2024-05-16 NOTE — Telephone Encounter (Signed)
 Patient missed MRI appointment today.  Attempted to call him, appointment with Dr Buckley on 05/20/24 will be canceled until after MRI is completed.  Unable to leave VM, recording stated call could not be completed at this time.

## 2024-05-20 ENCOUNTER — Telehealth: Payer: Self-pay | Admitting: *Deleted

## 2024-05-20 ENCOUNTER — Encounter: Payer: Self-pay | Admitting: Radiology

## 2024-05-20 ENCOUNTER — Inpatient Hospital Stay: Admitting: Internal Medicine

## 2024-05-20 NOTE — Telephone Encounter (Signed)
 PC to patient, no answer, left VM - informed him today's appointment today with Dr Buckley has been canceled because he missed his MRI on 05/16/24.  Instructed patient to reschedule MRI by calling Central Scheduling (number given) & then contact our scheduling department to reschedule F/U appointment.  Instructed patient to contact this office with any questions or concerns.

## 2024-06-07 ENCOUNTER — Ambulatory Visit (HOSPITAL_COMMUNITY): Attending: Internal Medicine

## 2024-06-11 ENCOUNTER — Telehealth: Payer: Self-pay | Admitting: *Deleted

## 2024-06-11 NOTE — Telephone Encounter (Signed)
 Received PC from patient, he wants to schedule MRI - per the patient,he missed two previous MRI appointments due to broken ribs.  Central Scheduling number given to patient to schedule MRI, instructed patient to contact our scheduling department once he has his MRI date to arrange F/U with Dr Buckley.  He verbalizes understanding.

## 2024-06-20 ENCOUNTER — Other Ambulatory Visit: Payer: Self-pay | Admitting: Internal Medicine

## 2024-06-20 DIAGNOSIS — D329 Benign neoplasm of meninges, unspecified: Secondary | ICD-10-CM

## 2024-06-25 ENCOUNTER — Ambulatory Visit (HOSPITAL_COMMUNITY): Admission: RE | Admit: 2024-06-25

## 2024-07-01 ENCOUNTER — Ambulatory Visit (HOSPITAL_COMMUNITY)
Admission: RE | Admit: 2024-07-01 | Discharge: 2024-07-01 | Disposition: A | Source: Ambulatory Visit | Attending: Internal Medicine

## 2024-07-01 DIAGNOSIS — D329 Benign neoplasm of meninges, unspecified: Secondary | ICD-10-CM | POA: Insufficient documentation

## 2024-07-01 MED ORDER — GADOBUTROL 1 MMOL/ML IV SOLN
8.0000 mL | Freq: Once | INTRAVENOUS | Status: AC | PRN
  Administered 2024-07-01: 14:00:00 8 mL via INTRAVENOUS

## 2024-07-02 ENCOUNTER — Telehealth: Payer: Self-pay | Admitting: Internal Medicine

## 2024-07-02 NOTE — Telephone Encounter (Signed)
 Scheduled a follow-up for scan results. Called and left a voicemail with the day and time.

## 2024-07-15 ENCOUNTER — Inpatient Hospital Stay: Attending: Internal Medicine | Admitting: Internal Medicine

## 2024-07-15 ENCOUNTER — Telehealth: Payer: Self-pay | Admitting: Internal Medicine

## 2024-07-15 VITALS — BP 134/72 | HR 74 | Temp 97.7°F | Resp 20 | Wt 187.1 lb

## 2024-07-15 DIAGNOSIS — R569 Unspecified convulsions: Secondary | ICD-10-CM | POA: Insufficient documentation

## 2024-07-15 DIAGNOSIS — R531 Weakness: Secondary | ICD-10-CM | POA: Diagnosis not present

## 2024-07-15 DIAGNOSIS — Z79899 Other long term (current) drug therapy: Secondary | ICD-10-CM | POA: Diagnosis not present

## 2024-07-15 DIAGNOSIS — C700 Malignant neoplasm of cerebral meninges: Secondary | ICD-10-CM

## 2024-07-15 DIAGNOSIS — D320 Benign neoplasm of cerebral meninges: Secondary | ICD-10-CM | POA: Insufficient documentation

## 2024-07-15 NOTE — Telephone Encounter (Signed)
 Pt is aware of appt

## 2024-07-15 NOTE — Progress Notes (Signed)
 "  [facility] [address]  [place], [place] [address] [phone]   Interval Evaluation  Date of Service: 07/15/2024 Patient Name: Joshua [name] Patient MRN: [id number] Patient DOB: 20-May-1961 Provider: Arthea MARLA Manns, MD  Identifying Statement:  Joshua [name] is a 63 y.o. 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 Unice (WHO I) 01/10/19: Repeat craniotomy, debulking resection by Dr. Unice after tumor recurrence, seizures.  Path demonstrates islands of anaplasia c/w grade II/III. 04/05/19: Completes post-operative IMRT with Dr. Dewey  Interval History:  Joshua [name] presents today for follow up after recent MRI brain. No new or progressive changes today.  He remains seizure-free on Lamictal  200mg  BID; he does not report further auras since describing one at previous ivisit.  Klonopin  he has been dosing 0.5mg  at night.  Remains physically active, engaged.  H+P (02/05/19) Patient presents to review clinical course and care for his meningioma.  Initially he presented in 2014 with headache syndrome, was found to have tumor on imaging which was resected by Dr. Unice.  The patient was lost to follow up for ~5 years until he developed seizures, described as twitching of left leg spreading to arm this past month.  He required loads of Keppra  and Dilantin  to break events.  MRI demonstrated significant regrowth of the mass, and repeat craniotomy was performed on 01/10/19.  Small amount of residual tumor was visible on post-operative MRI.  Since surgery he has continued to experience numbness and clumsiness of his left leg, requiring cane for ambulation.  He is still pending home physical therapy evaluation.  He has had small seizures, consisting of few seconds of shaking of left leg, occurring less than daily.  Continues on Keppra  and Dilantin .     Medications: Current Outpatient Medications on File Prior to Visit  Medication Sig Dispense Refill   ACCU-CHEK GUIDE TEST test strip CHECK BLOOD SUGAR ONCE IN THE MORNING WHILE FASTING     Accu-Chek Softclix Lancets lancets every morning.     acetaminophen  (TYLENOL ) 500 MG tablet Take 1,000 mg by mouth every 6 (six) hours as needed for moderate pain or headache.     albuterol  (VENTOLIN  HFA) 108 (90 Base) MCG/ACT inhaler Inhale 2 puffs into the lungs every 6 (six) hours as needed for wheezing or shortness of breath. 8 g 2   amLODipine  (NORVASC ) 5 MG tablet TAKE 1 TABLET (5 MG TOTAL) BY MOUTH DAILY. TO LOWER BLOOD PRESSURE. STOP LOSARTAN  90 tablet 0   aspirin  81 MG chewable tablet Chew 81 mg by mouth daily.     atorvastatin  (LIPITOR) 20 MG tablet TAKE 1 TABLET (20 MG TOTAL) BY MOUTH DAILY. 30 tablet 0   clonazePAM  (KLONOPIN ) 0.5 MG tablet Take 1 tablet (0.5 mg total) by mouth at bedtime. 60 tablet 2   gabapentin  (NEURONTIN ) 300 MG capsule Take 300 mg by mouth 3 (three) times daily as needed (nerve pain).     lamoTRIgine  (LAMICTAL ) 200 MG tablet TAKE 1 TABLET (200 MG TOTAL) BY MOUTH 2 (TWO) TIMES DAILY. 60 tablet 3   lidocaine  (LIDODERM ) 5 % Place 1 patch onto the skin daily. Remove & Discard patch within 12 hours or as directed by MD (Patient taking differently: Place 1 patch onto the skin daily as needed (pain). Remove & Discard patch within 12 hours or as directed by MD) 14 patch 0   meclizine  (ANTIVERT ) 25 MG tablet Take  1 tablet (25 mg total) by mouth 3 (three) times daily as needed for dizziness. 30 tablet 0   meloxicam  (MOBIC ) 15 MG tablet Take 1 tablet (15 mg total) by mouth daily. 30 tablet 0   metFORMIN  (GLUCOPHAGE ) 500 MG tablet Take 1 tablet (500 mg total) by mouth 2 (two) times daily. 180 tablet 1   methocarbamol  (ROBAXIN ) 750 MG tablet Take 1 tablet (750 mg total) by mouth daily as needed for muscle spasms. 30 tablet 0   omeprazole  (PRILOSEC) 20 MG capsule Take 20 mg by mouth daily.      oxyCODONE  (ROXICODONE ) 5 MG immediate release tablet Take 1 tablet (5 mg total) by mouth every 8 (eight) hours as needed for severe pain (pain score 7-10). Do not take at the same time as Klonopin  30 tablet 0   oxyCODONE -acetaminophen  (PERCOCET/ROXICET) 5-325 MG tablet Take 1 tablet by mouth every 6 (six) hours as needed for severe pain (pain score 7-10). 10 tablet 0   propranolol  (INDERAL ) 10 MG tablet Take 1 tablet (10 mg total) by mouth 3 (three) times daily as needed (palpitations). 60 tablet 11   tadalafil  (CIALIS ) 5 MG tablet Take 1 tablet (5 mg total) by mouth daily as needed for erectile dysfunction. 1 hour prior to sexual activity 30 tablet 11   tamsulosin  (FLOMAX ) 0.4 MG CAPS capsule Take 1 capsule (0.4 mg total) by mouth daily. 90 capsule 3   VITAMIN A PO Take 1 capsule by mouth daily.     No current facility-administered medications on file prior to visit.    Allergies: No Known Allergies Past Medical History:  Past Medical History:  Diagnosis Date   Anxiety    Aortic atherosclerosis    Brain tumor (HCC) 02/20/2013   brain tumor removed in March 2014, Dr Verita   Diabetes mellitus without complication [facility])    Diffuse idiopathic skeletal hyperostosis 06/05/2013   Dizziness    Enlarged prostate    GERD (gastroesophageal reflux disease)    Headache(784.0)    scattered   High cholesterol    Hypertension    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   Meningioma (HCC)    Meningioma, recurrent of brain (HCC) 01/31/2019   Neuropathy 12/05/2019   Rotator cuff tear    right   Seizures (HCC)    11/27/19 last sz 1 wk ago   Past Surgical History:  Past Surgical History:  Procedure Laterality Date   APPLICATION OF CRANIAL NAVIGATION N/A 01/10/2019   Procedure: APPLICATION OF CRANIAL NAVIGATION;  Surgeon: Unice Pac, MD;  Location: [facility] OR;  Service: Neurosurgery;  Laterality: N/A;   CATARACT EXTRACTION Right    COLONOSCOPY WITH PROPOFOL  N/A 04/10/2023    Procedure: COLONOSCOPY WITH PROPOFOL ;  Surgeon: Unk Corinn Skiff, MD;  Location[facility] ENDOSCOPY;  Service: Gastroenterology;  Laterality: N/A;   CRANIOTOMY Right 11/06/2012   Procedure: CRANIOTOMY TUMOR EXCISION;  Surgeon: Pac Unice, MD;  Location: [facility] NEURO ORS;  Service: Neurosurgery;  Laterality: Right;  Right Parasagittal craniotomy for meningioma with Stealth   CRANIOTOMY Right 01/10/2019   Procedure: Right Parasagittal Craniotomy for Tumor;  Surgeon: Unice Pac, MD;  Location: [facility] OR;  Service: Neurosurgery;  Laterality: Right;  Right parasagittal craniotomy for tumor   ESOPHAGOGASTRODUODENOSCOPY     JOINT REPLACEMENT Right 2018   right hip   PROSTATE BIOPSY  03/2023   REVERSE SHOULDER ARTHROPLASTY Right 01/08/2021   Procedure: RIGHT REVERSE SHOULDER ARTHROPLASTY;  Surgeon: Addie Cordella Hamilton, MD;  Location: [facility] OR;  Service: Orthopedics;  Laterality: Right;   right knee arthroscopy     SHOULDER ARTHROSCOPY Right 06/09/2020   Procedure: RIGHT SHOULDER ARTHROSCOPY AND DEBRIDEMENT;  Surgeon: Harden Jerona GAILS, MD;  Location: [facility];  Service: Orthopedics;  Laterality: Right;   SHOULDER ARTHROSCOPY WITH SUBACROMIAL DECOMPRESSION, ROTATOR CUFF REPAIR AND BICEP TENDON REPAIR Left 09/12/2023   Procedure: LEFT SHOULDER ARTHROSCOPY, DEBRIDEMENT, MINI OPEN BICEP TENDON REPAIR AND ROTATOR CUFF TEAR REPAIR;  Surgeon: Addie Cordella Hamilton, MD;  Location: [facility] OR;  Service: Orthopedics;  Laterality: Left;   Social History:  Social History   Socioeconomic History   Marital status: Married    Spouse name: Not on file   Number of children: 2   Years of education: Not on file   Highest education level: Not on file  Occupational History   Not on file  Tobacco Use   Smoking status: Some Days    Current packs/day: 0.00    Average packs/day: (0.2 ttl pk-yrs)    Types: Cigarettes    Start date: 01/29/2004    Last attempt to quit: 01/29/2019    Years since quitting: 5.4    Passive  exposure: Past   Smokeless tobacco: Never   Tobacco comments:    weekend smoker  Vaping Use   Vaping status: Some Days   Substances: Nicotine, Flavoring  Substance and Sexual Activity   Alcohol use: Yes    Alcohol/week: 2.0 standard drinks of alcohol    Types: 2 Standard drinks or equivalent per week    Comment: couple drinks on the weekends   Drug use: No   Sexual activity: Not Currently  Other Topics Concern   Not on file  Social History Narrative      Caffeine - soda occass   Social Drivers of Health   Tobacco Use: Medium Risk (06/21/2024)   Received from [facility]   Patient History    Smoking Tobacco Use: Former    Smokeless Tobacco Use: Never    Passive Exposure: Not on file  Financial Resource Strain: Low Risk (07/21/2023)   Overall Financial Resource Strain (CARDIA)    Difficulty of Paying Living Expenses: Not hard at all  Food Insecurity: Patient Declined (11/29/2023)   Hunger Vital Sign    Worried About Running Out of Food in the Last Year: Patient declined    Ran Out of Food in the Last Year: Patient declined  Transportation Needs: Patient Declined (11/29/2023)   PRAPARE - Administrator, Civil Service (Medical): Patient declined    Lack of Transportation (Non-Medical): Patient declined  Physical Activity: Not on file  Stress: Not on file  Social Connections: Not on file  Intimate Partner Violence: Patient Declined (11/29/2023)   Humiliation, Afraid, Rape, and Kick questionnaire    Fear of Current or Ex-Partner: Patient declined    Emotionally Abused: Patient declined    Physically Abused: Patient declined    Sexually Abused: Patient declined  Depression (PHQ2-9): Not on file  Alcohol Screen: Not on file  Housing: Patient Declined (11/29/2023)   Housing Stability Vital Sign    Unable to Pay for Housing in the Last Year: Patient declined    Number of Times Moved in the Last Year: 0    Homeless in the Last Year: Patient declined  Utilities:  Patient Declined (11/29/2023)   [employer] Utilities    Threatened with loss of utilities: Patient declined  Health Literacy: Not on file   Family History:  Family History  Problem Relation Age of Onset  Hypertension Mother    Dementia Mother    Diabetes Mother    Hypertension Father    CAD Father    Diabetes Father    CAD Brother     Review of Systems: Constitutional: Denies fevers, chills or abnormal weight loss Eyes: Denies blurriness of vision Ears, nose, mouth, throat, and face: Denies mucositis or sore throat Respiratory: Denies cough, dyspnea or wheezes Cardiovascular: Denies palpitation, chest discomfort or lower extremity swelling Gastrointestinal:  Denies nausea, constipation, diarrhea GU: Denies dysuria or incontinence Skin: Denies abnormal skin rashes Neurological: Per HPI Musculoskeletal: Denies joint pain, back or neck discomfort. No decrease in ROM Behavioral/Psych: Denies anxiety, disturbance in thought content, and mood instability  Physical Exam: Vitals:   07/15/24 0935 07/15/24 0945  BP: (!) 141/75 134/72  Pulse: 74   Resp: 20   Temp: 97.7 F (36.5 C)   SpO2: 96%      KPS: 80. General: Alert, cooperative, pleasant, in no acute distress Head: Craniotomy scar noted, dry and intact. EENT: No conjunctival injection or scleral icterus. Oral mucosa moist Lungs: Resp effort normal Cardiac: Regular rate and rhythm Abdomen: Soft, non-distended abdomen Skin: No rashes cyanosis or petechiae. Extremities: No clubbing or edema  Neurologic Exam: Mental Status: Awake, alert, attentive to examiner. Oriented to self and environment. Language is fluent with intact comprehension.  Cranial Nerves: Visual acuity is grossly normal. Visual fields are full. Extra-ocular movements intact. No ptosis. Face is symmetric, tongue midline. Motor: Tone and bulk are normal.Power is impaired in left leg, 4+/5 distally. Reflexes are symmetric, no pathologic reflexes present. Impaired  heel to shin left leg Sensory: Stocking sensory loss Gait: Cane assisted  Labs: I have reviewed the data as listed    Component Value Date/Time   NA 140 04/11/2024 1307   NA 144 04/15/2020 1515   K 4.0 04/11/2024 1307   CL 103 04/11/2024 1307   CO2 25 04/11/2024 1307   GLUCOSE 115 (H) 04/11/2024 1307   BUN 13 04/11/2024 1307   BUN 13 04/15/2020 1515   CREATININE 0.90 04/11/2024 1307   CALCIUM  10.0 04/11/2024 1307   PROT 7.1 03/30/2023 2020   PROT 7.1 12/19/2022 1002   ALBUMIN  4.1 03/30/2023 2020   ALBUMIN  4.7 12/19/2022 1002   AST 29 03/30/2023 2020   ALT 22 03/30/2023 2020   ALKPHOS 74 03/30/2023 2020   BILITOT 0.6 03/30/2023 2020   BILITOT 0.2 12/19/2022 1002   GFRNONAA >60 04/11/2024 1307   GFRAA >60 04/17/2020 1313   Lab Results  Component Value Date   WBC 6.4 04/11/2024   NEUTROABS 5.1 03/30/2023   HGB 15.9 04/11/2024   HCT 46.5 04/11/2024   MCV 92.4 04/11/2024   PLT 217 04/11/2024   Imaging:  [facility] Clinician Interpretation: I have personally reviewed the CNS images as listed.  My interpretation, in the context of the patient's clinical presentation, is stable disease  MR BRAIN W WO CONTRAST Result Date: 07/01/2024 EXAM: MRI BRAIN WITH AND WITHOUT CONTRAST 07/01/2024 02:11:27 PM TECHNIQUE: Multiplanar multisequence MRI of the head/brain was performed with and without the administration of 8 mL gadobutrol  (GADAVIST ) 1 MMOL/ML injection. COMPARISON: MRI head 03/17/2023. CLINICAL HISTORY: Brain/CNS neoplasm, assess treatment response. Meningioma. FINDINGS: BRAIN AND VENTRICLES: Sequelae of vertex craniotomy and meningioma resection are again identified. Encephalomalacia and gliosis in the frontoparietal regions bilaterally at the vertex are unchanged. 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 Mild dural thickening and enhancement extending from  the vertex laterally over both cerebral  convexities, left slightly greater than right, and along the falx is unchanged. No progressive or masslike enhancement is seen to indicate recurrent tumor. Moderate nonspecific T2 hyperintensity in the deep cerebral white matter more inferiorly bilaterally is unchanged. No acute infarct, acute intracranial hemorrhage, hydrocephalus, midline shift, or extra-axial fluid collection is identified. Major intracranial arterial flow voids are preserved. ORBITS: Bilateral cataract extraction. SINUSES: Well aerated paranasal sinuses. Trace left mastoid fluid. BONES AND SOFT TISSUES: Vertex craniotomy. No acute soft tissue abnormality. IMPRESSION: 1. Stable post-treatment appearance of the brain without evidence of recurrent meningioma. Electronically signed by: Dasie Hamburg MD 07/01/2024 02:56 PM EST RP Workstation: [id number]     Assessment/Plan 1. Meningioma, recurrent of brain (HCC)  2. Seizures [facility])  Joshua [name] is clinically stable today.  Seizures are well controlled overall.  MRI brain continues to demonstrate stable findings.   Will recommend continuing LMG 200mg  BID.    Klonopin  can continue at 0.5mg  HS as discussed.    He will continue to work with PT for leg weakness, chronic.  We ask that Joshua [name] return to clinic in 12 months with MRI brain for review, or sooner if needed.    We appreciate the opportunity to participate in the care of Joshua [name].    All questions were answered. The patient knows to call the clinic with any problems, questions or concerns. No barriers to learning were detected.    I have spent a total of 40 minutes of face-to-face and non-face-to-face time, excluding clinical staff time, preparing to see patient, ordering tests and/or medications, counseling the patient, and care coordination    Pat Sires K Camrin Lapre, MD Medical Director of Neuro-Oncology [facility] at [place] [facility] 07/15/2024 9:43 AM "

## 2024-07-22 ENCOUNTER — Ambulatory Visit (INDEPENDENT_AMBULATORY_CARE_PROVIDER_SITE_OTHER): Admitting: Surgical

## 2024-07-22 ENCOUNTER — Encounter: Payer: Self-pay | Admitting: Surgical

## 2024-07-22 DIAGNOSIS — N401 Enlarged prostate with lower urinary tract symptoms: Secondary | ICD-10-CM

## 2024-07-22 DIAGNOSIS — M19012 Primary osteoarthritis, left shoulder: Secondary | ICD-10-CM

## 2024-07-22 DIAGNOSIS — Z9889 Other specified postprocedural states: Secondary | ICD-10-CM

## 2024-07-22 NOTE — Progress Notes (Signed)
 "  Follow-up Office Visit Note   Patient: Joshua [name]           Date of Birth: 1961/03/24           MRN: [id number] Visit Date: 07/22/2024 Requested by: Patti Mliss LABOR, FNP [address] [place],  [place] [address] PCP: Patti Mliss LABOR, FNP  Subjective: Chief Complaint  Patient presents with   Left Shoulder - Pain    HPI: Joshua [name] is a 64 y.o. male who returns to the office for follow-up visit.    Plan at last visit was:  Joshua [name] is a 64 y.o. male who returns to the office for follow-up visit.  Plan from last visit was noted above in HPI.  They now return with CT scan of the left shoulder done for preoperative planning purposes.  Patient wishes to proceed with left shoulder reverse shoulder arthroplasty.  He understands the recovery timeframe and the surgical process from his right shoulder replacement but these were detailed again to refresh Joshua [name]'s memory.  We discussed the risks and benefits of the procedure.  Patient would like to proceed with surgery.  He will require medical/cardio risk stratification prior to procedure.  Follow-up after surgery.   Since then, patient notes he did not end up having surgery due to fear of infection and the pain somewhat getting better.  He now states that the pain is worsening and keeps him up from sleep pretty much every night.  He describes focal left shoulder pain in the anterior lateral aspect of the shoulder.  Denies any neck pain or scapular pain.  He has no numbness or tingling in the arm except when he sleeps on the arm and he will wake up and have some numbness in his arm but this only lasts for a few minutes and never happens during the day.  He feels weak when he tries to lift objects with his left arm.  Lifting causes increased pain as well.              ROS: All systems reviewed are negative as they relate to the chief complaint within the history of present illness.  Patient denies fevers or  chills.  Assessment & Plan: Visit Diagnoses:  1. Glenohumeral arthritis, left     Plan: RATHANA VIVEROS is a 64 y.o. male who returns to the office for follow-up visit.  Plan from last visit was noted above in HPI.  They now return with continued left shoulder pain.  Never ended up having surgery for large recurrent full-thickness tearing of the rotator cuff.  He would like to proceed with reverse shoulder arthroplasty.  We did discuss risks and benefits of the procedure as we did at the last visit including but not limited to the risk of nerve/vessel damage, need for revision surgery, infection, fracture, dislocation.  He was agreeable to the plan.  Will need to obtain a new thin cut CT scan for preoperative planning purposes.  Will post him after CT scan.  Needs cardiac risk stratification prior to procedure.  Takes 81 mg aspirin  but no other blood thinners.  Follow-Up Instructions: No follow-ups on file.   Orders:  Orders Placed This Encounter  Procedures   CT SHOULDER LEFT WO CONTRAST   No orders of the defined types were placed in this encounter.     Procedures: No procedures performed   Clinical Data: No additional findings.  Objective: Vital Signs: There were no vitals taken  for this visit.  Physical Exam:  Constitutional: Patient appears well-developed HEENT:  Head: Normocephalic Eyes:EOM are normal Neck: Normal range of motion Cardiovascular: Normal rate Pulmonary/chest: Effort normal Neurologic: Patient is alert Skin: Skin is warm Psychiatric: Patient has normal mood and affect  Ortho Exam: Ortho exam demonstrates left shoulder with 45 degrees X rotation, 110 degrees abduction, 140 degrees forward elevation passively and actively.  Has intact EPL, FPL, finger abduction, pronation/supination, bicep, tricep, deltoid.  Actually nerve is intact with deltoid firing.  Weakness with infra and supra strength testing rated 4/5.  Subscap strength rated 5 -/5.  Negative  external rotation lag sign.  Palpable radial pulse of the left upper extremity.  Has tenderness over the anterior aspect of the shoulder.  No tenderness over the [facility] joint.  No fluctuance noted.  Incisions are well-healed from prior rotator cuff surgery through mini open incision.  Specialty Comments:  Narrative & Impression CLINICAL DATA:  Low back pain and bilateral hip pain with extension down the left leg. Six months duration. Symptoms worsened after a fall 2 months ago.   EXAM: MRI LUMBAR SPINE WITHOUT CONTRAST   TECHNIQUE: Multiplanar, multisequence MR imaging of the lumbar spine was performed. No intravenous contrast was administered.   COMPARISON:  Radiography 01/11/2023   FINDINGS: Segmentation:  5 lumbar type vertebral bodies.   Alignment: Straightening of the normal lumbar lordosis. Scoliotic curvature convex to the right with the apex at L3.   Vertebrae: No fracture in the lumbar region. Abnormal edema visible at the edge of the study at the S3 level consistent with a recent sacral fracture. This was not evaluated completely. Benign appearing hemangioma within the left side of the L4 vertebral body.   Conus medullaris and cauda equina: Conus extends to the L1 level. Conus and cauda equina appear normal.   Paraspinal and other soft tissues: Negative   Disc levels:   T11-12, T12-L1 and L1-2: No disc pathology. Right-sided osteophytes at the L1-2 level do encroach somewhat of upon the extraforaminal region but the significance is doubtful.   L2-3: Prominent right lateral bridging osteophytes. Right foraminal to extraforaminal encroachment could possibly affect the right L2 nerve, but finding is obviously chronic.   L3-4: Similar prominent right-sided osteophytes with encroachment upon the right lateral foraminal to extraforaminal region.   L4-5: Bulging of the discs with lateral osteophytes prominent on the left. Mild facet and ligamentous hypertrophy. Mild  narrowing of the lateral recesses but no compression of the nerves within the canal. The left-sided osteophytes could possibly affect the L4 nerve in the extra-spinal region.   L5-S1: Endplate osteophytes and bulging of the disc. Bilateral facet osteoarthritis. No canal or lateral recess stenosis. Bilateral foraminal encroachment could affect either L5 nerve. The facet arthritis could be painful.   IMPRESSION: 1. Abnormal edema at the edge of the study at the S3 level consistent with a recent sacral fracture. This was not evaluated completely. 2. Scoliotic curvature convex to the right with the apex at L3. 3. Right-sided bridging osteophytes at L2-3 and L3-4 could possibly affect the right-sided exiting nerves. Finding is obviously chronic. 4. Left-sided osteophytes at L4-5 could possibly affect the left L4 nerve in the extra-spinal region. This also appears chronic. 5. Bilateral foraminal stenosis due to osteophytes, bulging of the disc and facet arthropathy at L5-S1 that could affect either L5 nerve. The facet arthritis could be painful.  Imaging: No results found.   PMFS History: Patient Active Problem List   Diagnosis Date Noted  Biceps tendonitis on left 09/21/2023   Degenerative superior labral anterior-to-posterior (SLAP) tear of left shoulder 09/21/2023   Synovitis of left shoulder 09/21/2023   History of colonic polyps 04/10/2023   Acute prostatitis 04/01/2023   DM (diabetes mellitus) (HCC) 03/31/2023   Demand ischemia of myocardium (HCC) 03/31/2023   Severe sepsis (HCC) 03/30/2023   Elevated troponin 03/30/2023   Paroxysmal SVT (supraventricular tachycardia) 03/30/2023   High anion gap metabolic acidosis 03/30/2023   Hematuria 03/30/2023   S/p prostate biopsy 03/29/23 03/30/2023   Hypotension 03/30/2023   History of recurrent meningioma s/p resection x2 03/30/2023   Aortic atherosclerosis 10/20/2022   Pain in limb 06/14/2022   Pain due to onychomycosis of  toenails of both feet 03/04/2021   Arthritis of right shoulder region    S/P reverse total shoulder arthroplasty, 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   Essential hypertension 07/02/2020   Prediabetes 07/02/2020   Polyuria 07/02/2020   Complete tear of left rotator cuff    Impingement syndrome of right shoulder    Neuropathy 12/05/2019   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   Seizures (HCC) 01/31/2019   Meningioma, recurrent of brain (HCC) 01/31/2019   Meningioma (HCC) 01/03/2019   Diffuse idiopathic skeletal hyperostosis 06/05/2013   Past Medical History:  Diagnosis Date   Anxiety    Aortic atherosclerosis    Brain tumor (HCC) 02/20/2013   brain tumor removed in March 2014, Dr Verita   Diabetes mellitus without complication (HCC)    Diffuse idiopathic skeletal hyperostosis 06/05/2013   Dizziness    Enlarged prostate    GERD (gastroesophageal reflux disease)    Headache(784.0)    scattered   High cholesterol    Hypertension    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   Meningioma (HCC)    Meningioma, recurrent of brain (HCC) 01/31/2019   Neuropathy 12/05/2019   Rotator cuff tear    right   Seizures (HCC)    11/27/19 last sz 1 wk ago    Family History  Problem Relation Age of Onset   Hypertension Mother    Dementia Mother    Diabetes Mother    Hypertension Father    CAD Father    Diabetes Father    CAD Brother     Past Surgical History:  Procedure Laterality Date   APPLICATION OF CRANIAL NAVIGATION N/A 01/10/2019   Procedure: APPLICATION OF CRANIAL NAVIGATION;  Surgeon: Unice Pac, MD;  Location: [facility] OR;  Service: Neurosurgery;  Laterality: N/A;   CATARACT EXTRACTION Right    COLONOSCOPY WITH PROPOFOL  N/A 04/10/2023   Procedure: COLONOSCOPY WITH PROPOFOL ;  Surgeon: Unk Corinn Skiff, MD;  Location: [facility] ENDOSCOPY;  Service: Gastroenterology;  Laterality: N/A;   CRANIOTOMY Right 11/06/2012   Procedure: CRANIOTOMY TUMOR EXCISION;  Surgeon:  Pac Unice, MD;  Location: [facility] NEURO ORS;  Service: Neurosurgery;  Laterality: Right;  Right Parasagittal craniotomy for meningioma with Stealth   CRANIOTOMY Right 01/10/2019   Procedure: Right Parasagittal Craniotomy for Tumor;  Surgeon: Unice Pac, MD;  Location: [facility] OR;  Service: Neurosurgery;  Laterality: Right;  Right parasagittal craniotomy for tumor   ESOPHAGOGASTRODUODENOSCOPY     JOINT REPLACEMENT Right 2018   right hip   PROSTATE BIOPSY  03/2023   REVERSE SHOULDER ARTHROPLASTY Right 01/08/2021   Procedure: RIGHT REVERSE SHOULDER ARTHROPLASTY;  Surgeon: Addie Cordella Hamilton, MD;  Location: [facility] OR;  Service: Orthopedics;  Laterality: Right;   right knee arthroscopy     SHOULDER ARTHROSCOPY Right 06/09/2020   Procedure: RIGHT  SHOULDER ARTHROSCOPY AND DEBRIDEMENT;  Surgeon: Harden Jerona GAILS, MD;  Location: [facility];  Service: Orthopedics;  Laterality: Right;   SHOULDER ARTHROSCOPY WITH SUBACROMIAL DECOMPRESSION, ROTATOR CUFF REPAIR AND BICEP TENDON REPAIR Left 09/12/2023   Procedure: LEFT SHOULDER ARTHROSCOPY, DEBRIDEMENT, MINI OPEN BICEP TENDON REPAIR AND ROTATOR CUFF TEAR REPAIR;  Surgeon: Addie Cordella Hamilton, MD;  Location: [facility] OR;  Service: Orthopedics;  Laterality: Left;   Social History   Occupational History   Not on file  Tobacco Use   Smoking status: Some Days    Current packs/day: 0.00    Average packs/day: (0.2 ttl pk-yrs)    Types: Cigarettes    Start date: 01/29/2004    Last attempt to quit: 01/29/2019    Years since quitting: 5.4    Passive exposure: Past   Smokeless tobacco: Never   Tobacco comments:    weekend smoker  Vaping Use   Vaping status: Some Days   Substances: Nicotine, Flavoring  Substance and Sexual Activity   Alcohol use: Yes    Alcohol/week: 2.0 standard drinks of alcohol    Types: 2 Standard drinks or equivalent per week    Comment: couple drinks on the weekends   Drug use: No   Sexual activity: Not Currently        "

## 2024-07-23 ENCOUNTER — Ambulatory Visit: Admitting: Urology

## 2024-07-23 DIAGNOSIS — C61 Malignant neoplasm of prostate: Secondary | ICD-10-CM

## 2024-08-01 ENCOUNTER — Ambulatory Visit
Admission: RE | Admit: 2024-08-01 | Discharge: 2024-08-01 | Disposition: A | Source: Ambulatory Visit | Attending: Orthopedic Surgery | Admitting: Orthopedic Surgery

## 2024-08-01 DIAGNOSIS — M19012 Primary osteoarthritis, left shoulder: Secondary | ICD-10-CM

## 2024-08-07 ENCOUNTER — Ambulatory Visit: Payer: Self-pay | Admitting: Orthopedic Surgery

## 2024-08-07 NOTE — Progress Notes (Signed)
 Did you post

## 2024-08-08 NOTE — Progress Notes (Signed)
 Joshua [name]                                          MRN: [id number]   08/08/2024   The [facility] Quality Team Specialist reviewed this patient medical record for the purposes of chart review for care gap closure. The following were reviewed: abstraction for care gap closure-glycemic status assessment.    [facility] Quality Team

## 2024-08-09 NOTE — Progress Notes (Signed)
 Hi [name], you should have a sheet on Joshua [name], I think I given you 1-2 sheets that he has decided against surgery in the past.  I think now he is all for it.  Let me know if you need a new sheet

## 2024-08-13 ENCOUNTER — Ambulatory Visit: Admitting: Urology

## 2024-08-22 ENCOUNTER — Telehealth: Payer: Self-pay | Admitting: Orthopedic Surgery

## 2024-08-22 NOTE — Telephone Encounter (Signed)
 Called patient at [phone] to discuss available surgery dates for left reverse shoulder arthroplasty with Dr Addie.  No answer.  Left message on voicemail providing my name and direct number for scheduling.

## 2024-08-29 ENCOUNTER — Ambulatory Visit: Admitting: Urology

## 2025-06-30 ENCOUNTER — Inpatient Hospital Stay: Admitting: Internal Medicine
